# Patient Record
Sex: Male | Born: 1960
Health system: Southern US, Community
[De-identification: ages and names within clinical notes are randomized; demographics above are authoritative.]

## PROBLEM LIST (undated history)

## (undated) ENCOUNTER — Emergency Department (HOSPITAL_COMMUNITY): Admission: EM | Payer: Medicaid Other | Source: Home / Self Care

## (undated) ENCOUNTER — Emergency Department (HOSPITAL_COMMUNITY): Payer: Self-pay | Source: Home / Self Care

## (undated) DIAGNOSIS — I5043 Acute on chronic combined systolic (congestive) and diastolic (congestive) heart failure: Secondary | ICD-10-CM

## (undated) DIAGNOSIS — I428 Other cardiomyopathies: Secondary | ICD-10-CM

## (undated) DIAGNOSIS — G4710 Hypersomnia, unspecified: Secondary | ICD-10-CM

## (undated) DIAGNOSIS — R0600 Dyspnea, unspecified: Secondary | ICD-10-CM

## (undated) DIAGNOSIS — I472 Ventricular tachycardia, unspecified: Secondary | ICD-10-CM

## (undated) DIAGNOSIS — I4891 Unspecified atrial fibrillation: Secondary | ICD-10-CM

## (undated) DIAGNOSIS — I2510 Atherosclerotic heart disease of native coronary artery without angina pectoris: Secondary | ICD-10-CM

## (undated) DIAGNOSIS — Z9989 Dependence on other enabling machines and devices: Secondary | ICD-10-CM

## (undated) DIAGNOSIS — Z95810 Presence of automatic (implantable) cardiac defibrillator: Secondary | ICD-10-CM

## (undated) DIAGNOSIS — J449 Chronic obstructive pulmonary disease, unspecified: Secondary | ICD-10-CM

## (undated) DIAGNOSIS — F17200 Nicotine dependence, unspecified, uncomplicated: Secondary | ICD-10-CM

## (undated) DIAGNOSIS — M1990 Unspecified osteoarthritis, unspecified site: Secondary | ICD-10-CM

## (undated) DIAGNOSIS — F1011 Alcohol abuse, in remission: Secondary | ICD-10-CM

## (undated) DIAGNOSIS — I5022 Chronic systolic (congestive) heart failure: Secondary | ICD-10-CM

## (undated) DIAGNOSIS — G4733 Obstructive sleep apnea (adult) (pediatric): Secondary | ICD-10-CM

## (undated) DIAGNOSIS — I10 Essential (primary) hypertension: Secondary | ICD-10-CM

## (undated) HISTORY — DX: Essential (primary) hypertension: I10

## (undated) HISTORY — DX: Alcohol abuse, in remission: F10.11

## (undated) HISTORY — DX: Nicotine dependence, unspecified, uncomplicated: F17.200

## (undated) HISTORY — DX: Chronic systolic (congestive) heart failure: I50.22

## (undated) HISTORY — DX: Obstructive sleep apnea (adult) (pediatric): G47.33

## (undated) HISTORY — PX: VASECTOMY: SHX75

## (undated) HISTORY — PX: ICD IMPLANT: EP1208

## (undated) HISTORY — DX: Dependence on other enabling machines and devices: Z99.89

## (undated) HISTORY — DX: Ventricular tachycardia: I47.2

## (undated) HISTORY — DX: Ventricular tachycardia, unspecified: I47.20

## (undated) HISTORY — PX: HYDROCELE EXCISION / REPAIR: SUR1145

## (undated) HISTORY — DX: Dyspnea, unspecified: R06.00

## (undated) HISTORY — PX: TUMOR EXCISION: SHX421

## (undated) HISTORY — DX: Morbid (severe) obesity due to excess calories: E66.01

## (undated) HISTORY — DX: Atherosclerotic heart disease of native coronary artery without angina pectoris: I25.10

## (undated) HISTORY — DX: Hypersomnia, unspecified: G47.10

---
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
## 2007-12-10 ENCOUNTER — Ambulatory Visit: Payer: Self-pay | Admitting: Internal Medicine

## 2007-12-10 ENCOUNTER — Inpatient Hospital Stay (HOSPITAL_COMMUNITY): Admission: EM | Admit: 2007-12-10 | Discharge: 2007-12-12 | Payer: Self-pay | Admitting: Emergency Medicine

## 2007-12-11 ENCOUNTER — Encounter (INDEPENDENT_AMBULATORY_CARE_PROVIDER_SITE_OTHER): Payer: Self-pay | Admitting: Internal Medicine

## 2008-01-17 ENCOUNTER — Ambulatory Visit: Payer: Self-pay | Admitting: Cardiology

## 2008-01-17 LAB — CONVERTED CEMR LAB
BUN: 9 mg/dL (ref 6–23)
CO2: 33 meq/L — ABNORMAL HIGH (ref 19–32)
Chloride: 102 meq/L (ref 96–112)
Glucose, Bld: 105 mg/dL — ABNORMAL HIGH (ref 70–99)
Potassium: 4 meq/L (ref 3.5–5.1)
Pro B Natriuretic peptide (BNP): 218 pg/mL — ABNORMAL HIGH (ref 0.0–100.0)

## 2008-02-03 ENCOUNTER — Ambulatory Visit: Payer: Self-pay | Admitting: Cardiology

## 2008-02-24 ENCOUNTER — Ambulatory Visit: Payer: Self-pay | Admitting: Cardiology

## 2008-03-26 ENCOUNTER — Ambulatory Visit: Payer: Self-pay | Admitting: Cardiology

## 2008-04-21 ENCOUNTER — Encounter: Payer: Self-pay | Admitting: Cardiology

## 2008-04-21 ENCOUNTER — Ambulatory Visit: Payer: Self-pay

## 2008-04-21 ENCOUNTER — Ambulatory Visit: Payer: Self-pay | Admitting: Cardiology

## 2008-04-21 LAB — CONVERTED CEMR LAB
BUN: 14 mg/dL (ref 6–23)
Calcium: 9 mg/dL (ref 8.4–10.5)
Digitoxin Lvl: 0.3 ng/mL — ABNORMAL LOW (ref 0.8–2.0)
GFR calc Af Amer: 134 mL/min
GFR calc non Af Amer: 111 mL/min
Potassium: 4 meq/L (ref 3.5–5.1)
Sodium: 140 meq/L (ref 135–145)
TSH: 0.81 microintl units/mL (ref 0.35–5.50)

## 2008-05-11 ENCOUNTER — Ambulatory Visit: Payer: Self-pay | Admitting: Internal Medicine

## 2008-06-04 ENCOUNTER — Ambulatory Visit (HOSPITAL_BASED_OUTPATIENT_CLINIC_OR_DEPARTMENT_OTHER): Admission: RE | Admit: 2008-06-04 | Discharge: 2008-06-04 | Payer: Self-pay | Admitting: Nurse Practitioner

## 2008-06-04 ENCOUNTER — Encounter: Payer: Self-pay | Admitting: Pulmonary Disease

## 2008-06-06 ENCOUNTER — Ambulatory Visit: Payer: Self-pay | Admitting: Internal Medicine

## 2008-06-11 ENCOUNTER — Ambulatory Visit: Payer: Self-pay | Admitting: Cardiology

## 2008-06-19 ENCOUNTER — Ambulatory Visit: Payer: Self-pay | Admitting: Pulmonary Disease

## 2008-06-19 DIAGNOSIS — I10 Essential (primary) hypertension: Secondary | ICD-10-CM | POA: Insufficient documentation

## 2008-06-19 DIAGNOSIS — I5022 Chronic systolic (congestive) heart failure: Secondary | ICD-10-CM | POA: Insufficient documentation

## 2008-06-19 LAB — CONVERTED CEMR LAB
CO2: 30 meq/L (ref 19–32)
Chloride: 101 meq/L (ref 96–112)
GFR calc Af Amer: 134 mL/min
GFR calc non Af Amer: 111 mL/min
Potassium: 4 meq/L (ref 3.5–5.1)

## 2008-06-24 ENCOUNTER — Encounter: Payer: Self-pay | Admitting: Pulmonary Disease

## 2008-06-29 ENCOUNTER — Encounter: Payer: Self-pay | Admitting: Pulmonary Disease

## 2008-07-28 ENCOUNTER — Ambulatory Visit: Payer: Self-pay | Admitting: Internal Medicine

## 2008-09-25 ENCOUNTER — Ambulatory Visit: Payer: Self-pay

## 2008-09-25 ENCOUNTER — Encounter: Payer: Self-pay | Admitting: Internal Medicine

## 2008-09-25 ENCOUNTER — Ambulatory Visit: Payer: Self-pay | Admitting: Cardiology

## 2008-09-25 LAB — CONVERTED CEMR LAB
BUN: 18 mg/dL (ref 6–23)
Calcium: 9.1 mg/dL (ref 8.4–10.5)
Creatinine, Ser: 0.7 mg/dL (ref 0.4–1.5)
GFR calc non Af Amer: 130 mL/min
Glucose, Bld: 115 mg/dL — ABNORMAL HIGH (ref 70–99)
Sodium: 139 meq/L (ref 135–145)

## 2008-09-27 DIAGNOSIS — I2510 Atherosclerotic heart disease of native coronary artery without angina pectoris: Secondary | ICD-10-CM | POA: Insufficient documentation

## 2008-09-27 DIAGNOSIS — E669 Obesity, unspecified: Secondary | ICD-10-CM | POA: Insufficient documentation

## 2008-09-27 DIAGNOSIS — I4729 Other ventricular tachycardia: Secondary | ICD-10-CM | POA: Insufficient documentation

## 2008-09-27 DIAGNOSIS — I472 Ventricular tachycardia: Secondary | ICD-10-CM

## 2009-11-03 ENCOUNTER — Telehealth: Payer: Self-pay | Admitting: Internal Medicine

## 2009-11-12 ENCOUNTER — Emergency Department (HOSPITAL_COMMUNITY): Admission: EM | Admit: 2009-11-12 | Discharge: 2009-11-12 | Payer: Self-pay | Admitting: Emergency Medicine

## 2010-10-11 ENCOUNTER — Telehealth: Payer: Self-pay | Admitting: Internal Medicine

## 2010-10-18 NOTE — Progress Notes (Signed)
Summary: refill-- K+ / lasix / lisinopril   Phone Note Refill Request   Refills Requested: Medication #1:  KLOR-CON M20 20 MEQ CR-TABS 1 once daily   Supply Requested: 3 months  Medication #2:  LISINOPRIL 20 MG TABS 1 tablet two times a day   Supply Requested: 3 months  Medication #3:  LASIX 40 MG TABS 1 once daily   Supply Requested: 3 months [facility] in [place], [place]    Method Requested: Fax to Local Pharmacy Initial call taken by: Migdalia Dk,  November 03, 2009 12:20 PM    Prescriptions: KLOR-CON M20 20 MEQ CR-TABS (POTASSIUM CHLORIDE CRYS CR) 1 once daily  #90 x 1   Entered by:   Hardin Negus, RMA   Authorized by:   Dolores Patty, MD, [facility]   Signed by:   Hardin Negus, RMA on 11/03/2009   Method used:   Electronically to        [facility] (retail)       [address]       [place], [place]  [address]       Ph: [phone]       Fax: [phone]   RxID:   [id number] LASIX 40 MG TABS (FUROSEMIDE) 1 once daily  #90 x 1   Entered by:   Hardin Negus, RMA   Authorized by:   Dolores Patty, MD, [facility]   Signed by:   Hardin Negus, RMA on 11/03/2009   Method used:   Electronically to        [facility] (retail)       [address]       [place], [place]  [address]       Ph: [phone]       Fax: [phone]   RxID:   [id number] LISINOPRIL 20 MG TABS (LISINOPRIL) 1 tablet two times a day  #180 x 1   Entered by:   Hardin Negus, RMA   Authorized by:   Dolores Patty, MD, [facility]   Signed by:   Hardin Negus, RMA on 11/03/2009   Method used:   Electronically to        [facility] (retail)       [address]       [place], [place]  [address]       Ph: [phone]       Fax: [phone]   RxID:   [id number]

## 2010-10-20 NOTE — Progress Notes (Signed)
Summary: refill meds   Phone Note Refill Request Message from:  Patient on October 11, 2010 10:50 AM  Refills Requested: Medication #1:  KLOR-CON M20 20 MEQ CR-TABS 1 once daily  Medication #2:  LASIX 40 MG TABS 1 once daily [facility] on [address].    Method Requested: Fax to Local Pharmacy Initial call taken by: Lorne Skeens,  October 11, 2010 10:52 AM    Prescriptions: KLOR-CON M20 20 MEQ CR-TABS (POTASSIUM CHLORIDE CRYS CR) 1 once daily  #30 x 0   Entered by:   Hardin Negus, RMA   Authorized by:   Dolores Patty, MD, [facility]   Signed by:   Hardin Negus, RMA on 10/11/2010   Method used:   Electronically to        [facility] (retail)       [address]       [place], [place]  [address]       Ph: [phone]       Fax: [phone]   RxID:   [id number] LASIX 40 MG TABS (FUROSEMIDE) 1 once daily  #30 x 0   Entered by:   Hardin Negus, RMA   Authorized by:   Dolores Patty, MD, [facility]   Signed by:   Hardin Negus, RMA on 10/11/2010   Method used:   Electronically to        [facility] (retail)       [address]       [place], [place]  [address]       Ph: [phone]       Fax: [phone]   RxID:   [id number]

## 2010-12-03 ENCOUNTER — Encounter: Payer: Self-pay | Admitting: Cardiology

## 2010-12-07 LAB — URINALYSIS, ROUTINE W REFLEX MICROSCOPIC
Hgb urine dipstick: NEGATIVE
Protein, ur: NEGATIVE mg/dL
Urobilinogen, UA: 0.2 mg/dL (ref 0.0–1.0)
pH: 6 (ref 5.0–8.0)

## 2010-12-07 LAB — CBC
HCT: 43.9 % (ref 39.0–52.0)
MCHC: 34.9 g/dL (ref 30.0–36.0)
MCV: 94.7 fL (ref 78.0–100.0)
Platelets: 199 10*3/uL (ref 150–400)
RBC: 4.63 MIL/uL (ref 4.22–5.81)

## 2010-12-07 LAB — BASIC METABOLIC PANEL
Chloride: 106 mEq/L (ref 96–112)
Creatinine, Ser: 0.8 mg/dL (ref 0.4–1.5)
GFR calc Af Amer: >60 mL/min (ref >60)
Potassium: 4 mEq/L (ref 3.5–5.1)
Sodium: 138 mEq/L (ref 135–145)

## 2010-12-07 LAB — DIFFERENTIAL
Basophils Absolute: 0 10*3/uL (ref 0.0–0.1)
Basophils Relative: 0 % (ref 0–1)
Eosinophils Absolute: 0.1 10*3/uL (ref 0.0–0.7)
Lymphs Abs: 1.3 10*3/uL (ref 0.7–4.0)
Neutrophils Relative %: 67 % (ref 43–77)

## 2010-12-14 ENCOUNTER — Encounter: Payer: Self-pay | Admitting: Cardiology

## 2010-12-14 ENCOUNTER — Ambulatory Visit (INDEPENDENT_AMBULATORY_CARE_PROVIDER_SITE_OTHER): Payer: PRIVATE HEALTH INSURANCE | Admitting: Cardiology

## 2010-12-14 DIAGNOSIS — I2510 Atherosclerotic heart disease of native coronary artery without angina pectoris: Secondary | ICD-10-CM

## 2010-12-14 DIAGNOSIS — F1010 Alcohol abuse, uncomplicated: Secondary | ICD-10-CM | POA: Insufficient documentation

## 2010-12-14 DIAGNOSIS — I428 Other cardiomyopathies: Secondary | ICD-10-CM

## 2010-12-14 DIAGNOSIS — F1011 Alcohol abuse, in remission: Secondary | ICD-10-CM

## 2010-12-14 DIAGNOSIS — I5022 Chronic systolic (congestive) heart failure: Secondary | ICD-10-CM

## 2010-12-14 DIAGNOSIS — F17200 Nicotine dependence, unspecified, uncomplicated: Secondary | ICD-10-CM | POA: Insufficient documentation

## 2010-12-14 LAB — BASIC METABOLIC PANEL
CO2: 29 mEq/L (ref 19–32)
Calcium: 8.9 mg/dL (ref 8.4–10.5)
Chloride: 107 mEq/L (ref 96–112)
GFR: 121.88 mL/min (ref >60.00)
Glucose, Bld: 82 mg/dL (ref 70–99)

## 2010-12-14 MED ORDER — CARVEDILOL 12.5 MG PO TABS: 12.5000 mg | ORAL_TABLET | Freq: Two times a day (BID) | ORAL | Status: DC

## 2010-12-14 NOTE — Progress Notes (Signed)
HPI The patient has been gone from the practice for a while but had previously been managed for nonischemic cardiomyopathy. This was likely related to sleep apnea and alcohol use. Since last being seen he stopped his carvedilol for unclear reasons. I do see that his digoxin was discontinued. He is continued on the medications as listed. He wears CPAP. He drinks only on weekends. He says that compared to his initial presentation he breathes much better. He is not active exercising but he is at work. With this activity he says he works progressively without chest pressure, neck or arm discomfort. He has no significant shortness of breath and denies any PND or orthopnea. He has no palpitations, presyncope or syncope.  No Known Allergies  Current Outpatient Prescriptions on File Prior to Visit  Medication Sig Dispense Refill  . aspirin 81 MG tablet Take 81 mg by mouth daily. occasionally      . furosemide (LASIX) 40 MG tablet Take 40 mg by mouth daily.        Marland Kitchen lisinopril (PRINIVIL,ZESTRIL) 20 MG tablet Take 20 mg by mouth 2 (two) times daily.        . potassium chloride SA (K-DUR,KLOR-CON) 20 MEQ tablet Take 20 mEq by mouth daily.        . carvedilol (COREG) 25 MG tablet Take 25 mg by mouth daily.        Marland Kitchen DISCONTD: digoxin (LANOXIN) 0.25 MG tablet Take 250 mcg by mouth daily.          Past Medical History  Diagnosis Date  . HTN (hypertension)   . OSA on CPAP   . CAD (coronary artery disease)   . Ventricular tachycardia   . Dyspnea   . Obesity, morbid   . CHF (congestive heart failure)   . Hypersomnia    ROS As stated in the HPI and negative for all other systems.  PHYSICAL EXAM BP 142/92  Pulse 77  Ht 6\' 4"  (1.93 m)  Wt 324 lb (146.965 kg)  BMI 39.44 kg/m2 GENERAL:  Well appearing HEENT:  Pupils equal round and reactive, fundi not visualized, oral mucosa unremarkable NECK:  No jugular venous distention, waveform within normal limits, carotid upstroke brisk and symmetric, no bruits,  no thyromegaly LYMPHATICS:  No cervical, inguinal adenopathy LUNGS:  Clear to auscultation bilaterally BACK:  No CVA tenderness CHEST:  Unremarkable HEART:  PMI not displaced or sustained,S1 and S2 within normal limits, no S3, no S4, no clicks, no rubs, no murmurs ABD:  Flat, positive bowel sounds normal in frequency in pitch, no bruits, no rebound, no guarding, no midline pulsatile mass, no hepatomegaly, no splenomegaly. obese EXT:  2 plus pulses throughout, no edema, no cyanosis no clubbing SKIN:  No rashes no nodules NEURO:  Cranial nerves II through XII grossly intact, motor grossly intact throughout PSYCH:  Cognitively intact, oriented to person place and time  EKG:  Sinus rhythm, rate 77, left axis deviation, left anterior fascicular block, anterolateral T wave inversions.  Unchanged  ASSESSMENT AND PLAN

## 2010-12-14 NOTE — Assessment & Plan Note (Signed)
We have discussed the need for complete abstinence from alcohol.

## 2010-12-14 NOTE — Assessment & Plan Note (Signed)
We have discussed the need for complete abstinence from tobacco. He says he only smokes a few cigarettes daily.

## 2010-12-14 NOTE — Patient Instructions (Signed)
Have a BMP today Have A 2 D Echo for Cardiomyopathy Start Carvedilol 12.5 mg one twice a day See Dr Antoine Poche back in 12 months

## 2010-12-14 NOTE — Assessment & Plan Note (Signed)
He feels much better on CPAP. He will continue with this.

## 2010-12-14 NOTE — Assessment & Plan Note (Signed)
The patient understands the need to lose weight with diet and exercise. We have discussed specific strategies for this.  

## 2010-12-14 NOTE — Assessment & Plan Note (Signed)
His blood pressure is slightly elevated but he is not using his carvedilol which will be restarted as stated above.

## 2010-12-14 NOTE — Assessment & Plan Note (Signed)
I will restart carvedilol at 12.5 mg b.i.d. With a goal actually a 50 b.i.d. I will repeat an echocardiogram.

## 2010-12-21 ENCOUNTER — Other Ambulatory Visit (HOSPITAL_COMMUNITY): Payer: PRIVATE HEALTH INSURANCE | Admitting: Radiology

## 2010-12-25 ENCOUNTER — Other Ambulatory Visit: Payer: Self-pay | Admitting: Internal Medicine

## 2010-12-29 ENCOUNTER — Ambulatory Visit (HOSPITAL_COMMUNITY): Payer: PRIVATE HEALTH INSURANCE | Attending: Cardiology | Admitting: Radiology

## 2010-12-29 DIAGNOSIS — I2510 Atherosclerotic heart disease of native coronary artery without angina pectoris: Secondary | ICD-10-CM

## 2010-12-29 DIAGNOSIS — I059 Rheumatic mitral valve disease, unspecified: Secondary | ICD-10-CM | POA: Insufficient documentation

## 2011-01-10 ENCOUNTER — Telehealth: Payer: Self-pay | Admitting: Cardiology

## 2011-01-11 ENCOUNTER — Telehealth: Payer: Self-pay | Admitting: Cardiology

## 2011-01-11 NOTE — Telephone Encounter (Signed)
Pt calling re test results °

## 2011-01-11 NOTE — Progress Notes (Signed)
Pt is aware of echo results and was scheduled for 02/17/11 in follow up

## 2011-01-11 NOTE — Telephone Encounter (Signed)
Left message yesterday and today for pt to call back with echo results.  There is a new phone mesage from today please review it

## 2011-01-11 NOTE — Telephone Encounter (Signed)
Pt aware of results and was scheduled for follow up appt 02/17/2011 at 11:15 am

## 2011-01-11 NOTE — Telephone Encounter (Signed)
Left message for pt to call back for echo results.  

## 2011-01-26 ENCOUNTER — Telehealth: Payer: Self-pay | Admitting: Internal Medicine

## 2011-01-26 NOTE — Telephone Encounter (Signed)
Pt signed ROI,Mailed Appt Listing to pt 01/26/11/km

## 2011-01-31 NOTE — Assessment & Plan Note (Signed)
[facility]                          CHRONIC HEART FAILURE NOTE   NAME:[name] [name], [name] [name]                          MRN:          [id number]  DATE:02/03/2008                            DOB:          1960/12/01    PRIMARY CARDIOLOGIST:  [name]. [name], MD   PRIMARY CARE PHYSICIAN:  Windle Guard, M.D.   Delon returns today for further followup of his congestive heart  failure, which is secondary to nonischemic cardiomyopathy with history  of polysubstance abuse.  When I last saw [name] [name] earlier this month, I  had planned on increasing his carvedilol, but at that it turned out he  was only taking his dose once a day, so I had him increase it to b.i.d.  and return today for followup.  He states compliance with the medicines.  No problems with lightheadedness, dizziness, presyncope, syncopal  episodes, palpitations, orthopnea or PND, or peripheral edema.  Unfortunately, he continues to consume ETOH.  He states he drinks one to  two 6-packs over the weekend, and smokes 1-3 cigarettes a day.  He  denies any crack cocaine use.  He has continued to do some light  physical labor with his family.  States he will work for a couple of  hours and then take a break.  He can do this three or four days a week  without too much fatigue.  Otherwise, states he has been feeling much  better.   PAST MEDICAL HISTORY:  1. Congestive heart failure, relatively new in nature, nonischemic      with an EF of 20% status post cardiac catheterization and 2D      echocardiogram.  Patient with minimal coronary plaque.  2. Polysubstance abuse including alcohol, marijuana, cocaine and      tobacco on weekends.  3. Morbid obesity.  Obstructive sleep apnea untreated at this time.  4. Hypertension with poor management.  5. History of nonsustained V-tach during recent hospitalization.   REVIEW OF SYSTEMS:  As stated above.  Otherwise negative.   CURRENT MEDICATIONS:  1. Carvedilol  3.125 mg b.i.d.  2. Digoxin 0.25 daily.  3. Lisinopril 20 daily.  4. Furosemide 40 daily.  5. Potassium 20 mEq daily.   PHYSICAL EXAMINATION:  Weight 349 pounds.  Weight is up 5 pounds.  Blood  pressure 155/93.  Heart rate 70.  Mr. Ciotti is in no acute distress.  No  signs of jugular vein distension at 45 degree angle.  LUNGS:  He has fine, scattered rhonchi throughout.  Otherwise clear to  auscultation.  CARDIOVASCULAR:  Exam reveals an S1 and S2.  Regular rate and rhythm.  SKIN:  Warm and dry.  ABDOMEN:  Obese, soft, nontender.  LOWER EXTREMITIES:  Without clubbing, cyanosis or edema.  NEUROLOGICALLY: Alert and oriented x3.   IMPRESSION:  Congestive heart failure secondary to nonischemic  cardiomyopathy in the setting of polysubstance abuse.  We will increase  carvedilol to 6.25 mg b.i.d. and once again, have reinforced complete  abstinence from polysubstance use.  We will continue to titrate  medication as tolerated.  Plan on seeing  [name] [name] back in three weeks for  further adjustment in his carvedilol.  We will also talk with him about  his sleep apnea in followup.      Dorian Pod, ACNP  Electronically Signed      [name], MD, [facility]  Electronically Signed   MB/MedQ  DD: 02/03/2008  DT: 02/03/2008  Job #: [id number]   cc:   Windle Guard, M.D.

## 2011-01-31 NOTE — Assessment & Plan Note (Signed)
[facility]                          CHRONIC HEART FAILURE NOTE   NAME:[name], [name]                          MRN:          [id number]  DATE:02/24/2008                            DOB:          Oct 18, 1960    PRIMARY CARDIOLOGIST:  [name] [name], M.D.   PRIMARY CARE PHYSICIAN:  [name] [name], M.D.   [name] [name] returns today for follow-up of his congestive heart failure  which is secondary to nonischemic cardiomyopathy.  In the setting of  polysubstance abuse.  [name] [name] returns today for further titration of  this medication.  However, in talking with him he once again has only  been taking his carvedilol once a day even though I have written out  instructions for him both of the last office visits.  He states he has  only been taking the carvedilol once a day.  Each time I have titrated  the dose and he has gone back to once a day.  He continues to drink.  He  states he is now down to two beers on Saturday and Sunday.  Continues to  use marijuana on weekends.  Has an occasional cigarette.  He has been  doing some physical labor helping his father.  He states he planted a  dogwood tree this morning.  The hole had already been dug.  He just put  the tree in and filled in the dirt.  He states he has been doing some  walking.  Further clarification he is walking one mile about once a week  with his sister.  He denies any lightheadedness or dizziness, any fluid  volume overload.  Denies any episodes of chest discomfort.   PAST MEDICAL HISTORY:  1. Congestive heart failure, nonischemic with EF 20%, status post      cardiac catheterization and 2-D echocardiogram.  The patient with      minimal coronary plaque.  2. Ongoing polysubstance abuse including alcohol, marijuana and      tobacco with a history of cocaine use.  3. Morbid obesity.  4. Questionable obstructive sleep apnea.  5. Hypertension.  6. History of nonsustained V-tach during recent  hospitalization.   REVIEW OF SYSTEMS:  As stated above, otherwise negative.   CURRENT MEDICATIONS:  1. The patient is taking carvedilol 6.25 mg once a day only      (prescribed b.i.d.)  2. Aspirin 81 mg.  3. Digoxin 0.25 mg.  4. Lisinopril 20 mg.  5. Lasix 40 mg.  6. KCl 20 mEq.  7. Folate 1 mg daily.  8. Multivitamin.   PHYSICAL EXAMINATION:  VITAL SIGNS:  Weight 344 pounds.  The patient's  weight is down 5 pounds.  Blood pressure 142/81 with a heart rate of 70.  GENERAL:  He is in no acute distress.  NECK:  No signs of jugular vein distention at 45-degree angle.  LUNGS:  Clear to auscultation with distant breath sounds at bilateral  bases.  CARDIOVASCULAR:  S1 and S2, regular rate and rhythm.  Do not hear an S3.  ABDOMEN:  Obese, soft, nontender.  LOWER EXTREMITIES:  Without clubbing, cyanosis  or edema.  NEUROLOGIC:  Neurologically alert and oriented x3.   CLINICAL DATA:  The patient ambulated greater than 300 feet.  Pulse ox  maintained between 93% and 96%.  The patient with dyspnea on exertion  with current class II symptoms.  Once again I have written out for him  to take his carvedilol as follows, 6.25 mg twice a day for 1 week and  then 6.25 in the morning and 12.5 cm in the evening for 1 week and then  12.5 mg b.i.d. until he sees me back in 4 weeks.  Also going to go ahead  and have home sleep study done will treat as depending on results.  After next visit will have the patient proceed with CPX test.      Dorian Pod, ACNP  Electronically Signed      [name]. [name], MD  Electronically Signed   MB/MedQ  DD: 02/24/2008  DT: 02/24/2008  Job #: [id number]   cc:   [name] [name], M.D.

## 2011-01-31 NOTE — Assessment & Plan Note (Signed)
[facility]                          CHRONIC HEART FAILURE NOTE   NAME:[name] [name], [name] [name]                          MRN:          [id number]  DATE:09/25/2008                            DOB:          1961/02/08    PRIMARY CARDIOLOGIST:  [name]. [name], MD   PRIMARY CARE:  Windle Guard, MD   [name] returns today for further followup of his congestive heart  failure, which is secondary to nonischemic cardiomyopathy in the setting  of previous polysubstance abuse, obesity, and hypertension.  Unfortunately, [name] continues to consume some EtOH.  He states he is  down to a 6-pack a week.  He denies any recent crack cocaine use.  He  has continued to do quite well over the last few months.  He has been  diligent about trying to make lifestyle modifications.  He has been  increasing his walking and making changes in his diet.  His weight is  down almost 10 pounds today from his appointment back in the fall.  Very  compliant with his CPAP and compliant with medications.  He saw Dr.  [name] back in November and adjusted some of his medications.  He is  tolerating Coreg and lisinopril at current doses without problems.  Denies any episodes of palpitations, lightheadedness, dizziness,  presyncope, or syncopal episodes.  Denies any symptoms suggestive of  volume overload.   PAST MEDICAL HISTORY:  Congestive heart failure, secondary to  nonischemic cardiomyopathy in the setting of polysubstance abuse  including alcohol and crack cocaine, obesity, hypertension, and severe  sleep apnea.   CURRENT MEDICATIONS:  1. Aspirin 81.  2. Lasix 40.  3. KCl 20.  4. Folate 1 mg daily.  5. Multivitamin daily.  6. Coreg 25 b.i.d.  7. Lisinopril 40 daily.   REVIEW OF SYSTEMS:  As stated above.   PHYSICAL EXAMINATION:  VITAL SIGNS:  Weight 331, blood pressure 115/63,  and heart rate 68.  GENERAL:  Nachman is in no acute distress.  NECK:  No signs of jugular vein  distention at 45-degree angle, but quite  hard to tell with his neck size.  CARDIOVASCULAR:  S1 and S2.  Frequent early beats.  LUNGS:  Clear to auscultation.  ABDOMEN:  Obese, soft, and nontender.  Positive bowel sounds.  LOWER EXTREMITIES:  Without clubbing, cyanosis, or edema.  NEUROLOGICAL:  Alert and oriented x3.   IMPRESSION:  Congestive heart failure, secondary to nonischemic  cardiomyopathy.  Blood pressure is well controlled at this time.  Good  heart rate.  Also, have reinforced education on complete abstinence of  EtOH use.  Reinforced importance of continuous positive airway pressure.  Owen underwent a 2-D echocardiogram today.  I do not have the results  yet.  I suspect his ejection fraction is grossly normal.  We will  continue current medications including Coreg 25 b.i.d. and lisinopril  40.  I have him follow up in a couple of months for review of his 2-D  echocardiogram.  He is also due for lab work.      Dorian Pod, ACNP  Electronically Signed  [name], MD, [facility]  Electronically Signed   MB/MedQ  DD: 09/25/2008  DT: 09/26/2008  Job #: [id number]   cc:   Windle Guard, M.D.

## 2011-01-31 NOTE — Assessment & Plan Note (Signed)
[facility]                            CARDIOLOGY OFFICE NOTE   NAME:Freeze, [name] [name]                          MRN:          [id number]  DATE:05/11/2008                            DOB:          11-22-1960    PRIMARY CARE PHYSICIAN:  Windle Guard, M.D.   INTERVAL HISTORY:  [name] [name] is a very pleasant 50 year old male with a  history of hypertension, morbid obesity, and polysubstance abuse.  He  was diagnosed earlier this year with nonischemic cardiomyopathy with  severe LV dysfunction with an EF of 10-15%.  Cardiac catheterization  showed minimal nonobstructive coronary artery disease.   He has been following with Dorian Pod in the [facility] Failure Clinic  and has been doing quite well.  She last saw him in July and increased  his Coreg to 25 b.i.d.  He has tolerated this without a problem.  He  says he is able to do just about anything he wants to do.  He is walking  half a mile at a time without any dyspnea.  No chest pain.  He has  occasional mild lower extremity edema.  No orthopnea, no PND.  He feels  his breathing is the best it has been in a long time.  He smokes an  occasional cigarette but no further substance abuse.  He has not heard  yet back from [facility] his [facility]AP equipment.   CURRENT MEDICATIONS:  1. Aspirin 81.  2. Digoxin 0.25 a day.  3. Lisinopril 20 a day.  4. Lasix 40 a day.  5. Potassium 20 a day.  6. Folate 1 mg a day.  7. Coreg 25 b.i.d.   PHYSICAL EXAMINATION:  GENERAL:  He is in no acute distress, ambulatory  in the clinic without any respiratory difficulty.  VITAL SIGNS:  Blood pressure is 136/78; heart rate 69; weight is 340,  which is down 9 pounds.  HEENT:  Normal.  NECK:  Supple.  Unable to assess JVD due to the size of his neck.  Carotids are 2+ bilaterally without any bruits.  There is no  lymphadenopathy, no thyromegaly.  CARDIAC:  PMI is nondisplaced.  Regular rate and rhythm.  No S3, no  murmur.  LUNGS:  Clear.  ABDOMEN:  Obese, nontender, nondistended.  No obvious  hepatosplenomegaly, no bruits, no mass.  EXTREMITIES:  Warm with no cyanosis, clubbing, or edema.  No rash.  NEURO:  Alert and oriented x3.  Cranial nerves II through XII are  intact.  Moves all 4 extremities without difficulty.  Affect is  pleasant.   Followup echocardiogram done about 2 weeks ago showed an EF of 30-40%.  This was considerably up from 10-15%.  Ventricle is still mildly dilated  at 57 cm.  There is no mitral regurgitation, RV looks fine.   ASSESSMENT AND PLAN:  1. Nonischemic cardiomyopathy, likely due to polysubstance abuse.      This is now markedly improved.  His [employer] [place] Heart Association      class I to II volume status looks good.  I suspect his ejection  fraction will continue to improve with medical therapy and at this      point, I do not think he will need a defibrillator.  We will      recheck his EF in 3 months.  I will go ahead and increase his      lisinopril to 20 b.i.d. and check a BMET later this week.  We may      need to consider spironolactone as well.  2. Hypertension, improved control but still elevated.  We are      increasing lisinopril.  3. Sleep apnea.  I reminded him of the need to make sure he gets his      equipment in line.  4. Tobacco use.  He has a smoking history of a few cigarettes here and      there.  I reminded him of the need for complete cessation.   DISPOSITION:  He will follow up in the [facility] Failure Clinic in 1 month  and see me in 3 months.     [name]. [name], MD  Electronically Signed    DRB/MedQ  DD: 05/11/2008  DT: 05/11/2008  Job #: [id number]

## 2011-01-31 NOTE — Assessment & Plan Note (Signed)
[facility]                          CHRONIC HEART FAILURE NOTE   NAME:[name], [name]                          MRN:          [id number]  DATE:06/11/2008                            DOB:          1961-05-14    PRIMARY CARDIOLOGIST:  [name]. [name], MD   PRIMARY CARE PHYSICIAN:  Windle Guard, MD   Eilan returns today for further followup of his congestive heart  failure with secondary to nonischemic cardiomyopathy in the setting of  previous polysubstance abuse, obesity, and hypertension.  When I last  saw [name] [name] in July, he was doing quite well.  I increased his  carvedilol to 25 mg b.i.d.  I scheduled him for repeat BMET and echo.  His previous EF had been 15-20%.  Echocardiogram done on April 21, 2008,  showed an improvement to 30-40%.  The patient also followed up with Dr.  [name] since that time.  He states he increased his lisinopril to 20  mg b.i.d., but [name] [name] has not been taking it twice a day.  Also based  on the patient's weight, I planned on increasing his carvedilol to 50 mg  b.i.d., but [name] [name] has already done that.  He states he has been taking  50 twice a day for at least a month.  He continues to try and remain  active.  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  Tries to walk  1 or 2 miles 3 times a week, although he has not been successful in  losing any weight.  He denies any orthopnea or PND.  I sent him for a  sleep study which was done on June 04, 2008.  The patient found to  have severe obstructive sleep apnea with significant oxygen  desaturation.  His mean oxygen saturation before CPAP was about 83%.  He  was initiated on BiPAP, improved his oxygen saturation to 92% on room  air.  The patient is pending a followup pulmonary for evaluation.   PAST MEDICAL HISTORY:  1. Congestive heart failure secondary to nonischemic cardiomyopathy      with a history of polysubstance abuse, marked obesity, and  hypertension.  2. Severe sleep apnea, pending CPAP initiation.  3. Hypertension.  4. Tobacco use.  5. EtOH use.  6. History of nonsustained V-tach during recent hospitalization.  7. Status post cardiac catheterization showing a minimal coronary      plaque.   CURRENT MEDICATIONS:  1. Aspirin 81.  2. Digoxin 0.25.  3. Lisinopril 20.  The patient should be taking it b.i.d. per Dr.      Prescott Gum note, but the patient states he is only taking it once      a day.  4.  Lasix 40 mg daily.  4. KCl 20 mEq daily.  5. Folate 1 mg daily.  6. Multivitamin daily.  7. Coreg, the patient should be taking it 25 mg b.i.d.  He is taking      50 mg b.i.d.   REVIEW OF SYSTEMS:  As stated above, otherwise negative.   PHYSICAL EXAMINATION:  VITAL SIGNS:  Weight  340 pounds and blood  pressure 140/74 with a heart rate of 84.  GENERAL:  [name] [name] was in no acute distress.  NECK:  No obvious JVD at 45-degree angle, but difficult to assess  secondary to size of his neck.  CARDIOVASCULAR:  S1 and S2.  Regular rate and rhythm.  LUNGS:  Clear to auscultation.  ABDOMEN:  Soft, nontender, and positive bowel sounds.  EXTREMITIES:  Lower extremity is without clubbing, cyanosis, or edema.  NEUROLOGIC:  Alert and oriented x3.   IMPRESSION:  Congestive heart failure secondary to nonischemic  cardiomyopathy with much improvement in his ejection fraction from 15-  20% to 40%.  We will continue carvedilol 50 mg b.i.d. as this was goal  for patient anyway and he is tolerating the dose.  We will increase his  lisinopril to 20 mg b.i.d.  I have encouraged him to continue trying to  exercise and lose weight and abstain from polysubstance use.  Also, we  will have the patient return in 1 week for repeat BMET with increase in  his lisinopril.  I will see him back in 3 months for further evaluation.      Dorian Pod, ACNP  Electronically Signed      [name], MD, [facility]  Electronically Signed    MB/MedQ  DD: 06/11/2008  DT: 06/11/2008  Job #: [id number]   cc:   Windle Guard, M.D.

## 2011-01-31 NOTE — Discharge Summary (Signed)
NAME:  [name] [name], [name] [name]                 ACCOUNT NO.:  [id number]   MEDICAL RECORD NO.:  [id number]          PATIENT TYPE:  INP   LOCATION:  4729                         FACILITY:  [facility]   PHYSICIAN:  Marcellus Scott, MD     DATE OF BIRTH:  [birth year]   DATE OF ADMISSION:  12/10/2007  DATE OF DISCHARGE:  12/12/2007                               DISCHARGE SUMMARY   PRIMARY CARE PHYSICIAN:  Dr. Windle Guard.   DISCHARGE DIAGNOSES:  1. Acute systolic congestive heart failure secondary to nonischemic      cardiomyopathy.  2. Hypertension.  3. Obesity.  4. Rule out obstructive sleep apnea syndrome.  5. Polysubstance abuse - alcohol, marijuana, cocaine.  6. Metabolic syndrome.  7. Hypokalemia - repleted.  8. An episode of 7 beat nonsustained ventricular tachycardia.   DISCHARGE MEDICATIONS:  1. Enteric coated aspirin 81 mg p.o. daily.  2. Coreg 3.125 mg p.o. twice daily.  3. Digoxin 0.25 mg p.o. daily.  4. Lisinopril 20 mg p.o. daily.  5. Lasix 40 mg p.o. daily.  6. Potassium chloride 20 mEq p.o. daily.  7. Folate 1 mg p.o. daily.  8. Multivitamin 1 p.o. daily.   PROCEDURE:  1. Cardiac catheterization by Dr. [name] on March 25.      Conclusion:  Minimal coronary plaque. Severe left ventricular      dysfunction. Elevated end-diastolic pressure. Mildly elevated right-      sided pressures. Recommendations:  Medical management for      nonischemic cardiomyopathy. This should include treatment of      hypertension, sleep apnea and avoidance of alcohol among other      things.  2. Chest x-ray on March 24. Impression:  Findings compatible with CHF      with minimal interstitial edema. Chronic bronchitic changes.   PERTINENT LABS:  Basic metabolic panel unremarkable with BUN 12,  creatinine of 1.02. CBCs with hemoglobin 15.8, hematocrit 45, white  blood cells 6.5, platelets 216. Magnesium 2.2. Cardiac panels cycled and  negative, lipid panel with cholesterol 136, triglycerides  115, HDL 23,  LDL 90, VLDL 23. BNP of 459. Hemoglobin A1c of 5.6. TSH of 1.368. Urine  drug screen positive for opiates and tetrahydrocannabinol. Hepatic panel  unremarkable except bilirubin of 1.5. D-dimer 0.59.   CONSULTATIONS:  Cardiology, Dr. [name].   HOSPITAL COURSE AND PATIENT DISPOSITION:  Please refer to the history  and physical note for initial admission details. In summary, [name] [name] is  a 50 year old male patient with a history of polysubstance abuse,  probable hypertension who presented with paroxysmal nocturnal dyspnea,  orthopnea, dyspnea on exertion which was ongoing for 2 months. He denied  any frank chest pain. There was some history of obstructive sleep apnea  but the patient never completed a sleep study as an outpatient. Further  evaluation in the emergency room suggested new onset of congestive heart  failure. The patient was thereby admitted for further evaluation and  management.   1. Acute systolic congestive heart failure secondary to nonischemic      cardiomyopathy. The patient was admitted to the telemetry  bed. His      cardiac enzymes were cycled and were not in keeping with acute      coronary syndrome. He was started on IV Lasix, ACE inhibitors,      aspirin. Echocardiogram which showed left ventricular moderately      dilated with left ventricular systolic function severely reduced      with an ejection fraction between 10 and 15%. There was diffuse      left ventricular hypokinesis. Left ventricular wall thickness was      moderately increased. Cardiology was consulted. They proceeded to      do cardiac right and left catheterization. Please refer to their      notes for further details. The ejection fraction by the cath was      20% with global hypokinesis and a very large left ventricle. They      have indicated medical management. The patient's dyspnea is much      better now. He is stable for discharge to be followed up with the      heart  failure clinic.  2. Hypertension which needs tighter control as an outpatient.  3. Obesity. The patient has been counseled regarding dieting and      weight loss.  4. Rule out obstructive sleep apnea syndrome. The patient is so have      an outpatient sleep study done through his primary medical doctor      and may need CPAP nightly.  5. Polysubstance abuse: He was briefly placed on alcohol withdrawal      protocol however, he has never demonstrated any frank withdrawal.      Counceled.  6. Metabolic syndrome.  7. Hypokalemia which was repleted.  8. Episode of nonsustained ventricular tachycardia. The patient's      lytes were repleted. His beta blockers should help. He needs to      followup with cardiology. He may require repeat echocardiogram once      as he has been off polysubstance abuse and his medications to see      is his ejection fraction improves.      Marcellus Scott, MD  Electronically Signed     AH/MEDQ  D:  12/12/2007  T:  12/12/2007  Job:  [id number]   cc:   Windle Guard, M.D.  [name]. [name], MD

## 2011-01-31 NOTE — Cardiovascular Report (Signed)
NAME:  [name] [name], [name] [name]                 ACCOUNT NO.:  [id number]   MEDICAL RECORD NO.:  [id number]          PATIENT TYPE:  INP   LOCATION:  4729                         FACILITY:  [facility]   PHYSICIAN:  [name], MD, FACCDATE OF BIRTH:  01-14-1961   DATE OF PROCEDURE:  12/11/2007  DATE OF DISCHARGE:                            CARDIAC CATHETERIZATION   PRIMARY:  Dr. Windle Guard.   PROCEDURE:  Right and left heart catheterization/coronary arteriography.   INDICATION:  Evaluate patient with cardiomyopathy and heart failure.   PROCEDURE NOTE:  The right and left heart catheterization was performed  via the right femoral artery.  Both vessels were cannulated using  anterior wall puncture.  A #6 French arterial sheath and a #7-French  venous sheath were inserted via the modified Seldinger technique.  Preformed Judkins, pigtail and Swan-Ganz catheter were utilized.  The  patient tolerated the procedure well and left the lab in stable  condition.   RESULTS:  Hemodynamics:  RA mean 12, RV 38/12, PA 36/16 with a mean of  27, pulmonary capillary wedge pressure mean 17, LV 118/21, AO 120/89.   Coronaries:  The left main was normal.  The LAD had luminal  irregularities.  There was proximal 25% stenosis.  First and second  diagonal were small and normal.  Third diagonal was moderate-sized  and  normal.  The circumflex in the AV groove had luminal irregularities.  There was a ramus intermedius which was small and  normal.  There was a  mid obtuse marginal which was large and normal.  The right coronary  artery was dominant.  There were diffuse luminal irregularities.  The  PDA was moderate-sized and normal.  Posterolateral was moderate-sized  and normal.   LEFT VENTRICULOGRAM:  A left ventriculogram was obtained in the RAO  projection.  The EF was about 20% with global hypokinesis.  It was a  very large ventricle.   CONCLUSION:  Minimal coronary plaque.  Severe left ventricular  dysfunction.  Elevated EDP.  Mildly elevated right-sided pressures.   PLAN:  The patient will have medical management for his nonischemic  cardiomyopathy.  This should include treatment of his hypertension,  sleep apnea and avoidance of alcohol among other things.      [name], MD, [facility]  Electronically Signed    JH/MEDQ  D:  12/11/2007  T:  12/11/2007  Job:  [id number]   cc:   Windle Guard, M.D.

## 2011-01-31 NOTE — Procedures (Signed)
NAME:  [name] [name], [name] [name]                 ACCOUNT NO.:  [id number]   MEDICAL RECORD NO.:  [id number]          PATIENT TYPE:  OUT   LOCATION:  SLEEP CENTER                 FACILITY:  [facility]   PHYSICIAN:  Clinton D. Maple Hudson, MD, FCCP, FACPDATE OF BIRTH:  [birth year]   DATE OF STUDY:  06/04/2008                            NOCTURNAL POLYSOMNOGRAM   REFERRING PHYSICIAN:   REFERRING PHYSICIAN:  Dorian Pod, ACNP.   INDICATION FOR STUDY:  Hypersomnia with sleep apnea, snoring, and  daytime fatigue.   EPWORTH SLEEPINESS SCORE:  19/24.   BMI:  41.4.   WEIGHT:  340 pounds.   HEIGHT:  76 inches.   NECK:  21.5 inches.   HOME MEDICATIONS:  Charted and reviewed.   SLEEP ARCHITECTURE:  Split-study protocol.  During the diagnostic phase,  total sleep time was 124 minutes with sleep efficiency 69.7%.  Stage I  was 5.6%.  Stage II 91.1%.  Stage III absent.  REM 3.2% of total sleep  time.  Sleep latency 16.5 minutes.  REM latency 155.5 minutes.  Awake  after sleep onset 36 minutes.  Arousal index 97.3, indicating increased  EEG arousal.   RESPIRATORY DATA:  Split-study protocol.  Apnea-hypopnea index (AHI)  109.4 per hour.  There were 226 events scored, including 224 obstructive  apneas and 2 hypopneas before CPAP.  Events were not positional.  REM  AHI 75.  CPAP was titrated to 18 CWP with incomplete control of events.  The technician then changed to bilevel pressure:  Inspiratory 21,  expiratory 18 with an AHI of 3.5 per hour.  Patient chose a large Mirage  Quattro full face mask with heated humidifier.   OXYGEN DATA:  Loud snoring with oxygen desaturation to a nadir of 33%  and mean saturation of 83.7% before CPAP.  After CPAP control, mean  oxygen saturation held 92.6% on room air.   CARDIAC DATA:  Multifocal PVCs, bigeminy, and PACs.  The technician  reported heart rate into the 30s, but this appeared to reflect  ventricular bigeminy.   MOVEMENT-PARASOMNIA:  Occasional limb jerk,  insignificant.  Bathroom x1.   IMPRESSIONS-RECOMMENDATIONS:  1. Severe obstructive sleep apnea/hypopnea syndrome (apnea-hypopnea      index 109.4 per hour with nonpositional events, loud snoring, and      significant oxygen desaturation into the 33% range transiently).      Mean oxygen saturation before CPAP was only 83.7%.  2. Successful positive airway pressure titration required BiPAP,      inspiratory 21/expiratory 18 for an apnea-hypopnea index of 3.5 per      hour.  CPAP at 18 CWP had provided incomplete control.  He chose a      large Mirage Quattro full face mask with heated humidifier.  Notes      that oxygen saturation was improved by positive airway pressure to      92.6% on room air.      Clinton D. Maple Hudson, MD, [facility], FACP  Diplomate, Biomedical engineer of Sleep Medicine  Electronically Signed     CDY/MEDQ  D:  06/06/2008 08:37:45  T:  06/06/2008 13:19:32  Job:  [id number]

## 2011-01-31 NOTE — H&P (Signed)
NAME:  [name] [name], [name] [name]                 ACCOUNT NO.:  [id number]   MEDICAL RECORD NO.:  [id number]          PATIENT TYPE:  EMS   LOCATION:  MAJO                         FACILITY:  [facility]   PHYSICIAN:  [name] [name], M.D.       DATE OF BIRTH:  03/18/1961   DATE OF ADMISSION:  12/10/2007  DATE OF DISCHARGE:                              HISTORY & PHYSICAL   PRIMARY CARE PHYSICIAN:  [name] [name], M.D.   CHIEF COMPLAINT:  Shortness of breath.   HISTORY OF PRESENT ILLNESS:  [name] [name] is a 51 year old gentleman with  past medical history of tobacco abuse, obesity, and probable  hypertension, who has been having worsening episodes of paroxysmal  nocturnal dyspnea, orthopnea, and dyspnea on exertion for the past 2  months.  Today, he had another episode of shortness of breath in the  morning, and his mother forced him to come to the emergency room.  The  patient denies any frank chest pain, but he says that breathing is quite  difficult.   PAST MEDICAL HISTORY:  1. Obstructive sleep apnea, for which the patient was scheduled to      undergo a sleep study, but he never showed up.  2. Varicocele surgery.   HOME MEDICATIONS:  Ibuprofen.   SOCIAL HISTORY:  The patient is unemployed.  He used to smoke a pack of  cigarettes a day until 3 years ago, which puts him up to a 20 pack-year  history.  He drinks heavy beer every other day, up to 6-packs, even more  on the weekends.  He uses cocaine occasionally, last time about a week  ago.   FAMILY HISTORY:  The patient's mother is alive.  Had CABG at age 51.  The patient's grandfather had CABG.  Father is alive and healthy.  Two  sisters and one brother are alive and healthy.   REVIEW OF SYSTEMS:  As per HPI.  Also positive for snoring.  Positive  for nocturia.  Positive for daytime sleepiness.  Positive for left knee  pain.  Positive for an abdominal rectus muscle diastasis.  All other  systems as per HPI.  Other systems negative.   PHYSICAL EXAM UPON  ADMISSION:  VITAL SIGNS:  Temperature 96.9, [name]  pressure 130/84, pulse 91, respirations 30, saturations 94% on room air.  GENERAL APPEARANCE:  A morbidly obese gentleman, sitting on the  stretcher, in minimal distress.  He is alert, oriented, to place,  person, and time.  HEENT:  His head indicates clear rhinophyma.  Eyes:  Pupils equal and  round, reactive to light and accommodation.  Extraocular movements  intact.  Conjunctiva pink.  Sclerae anicteric.  Throat clear.  NECK:  Supple.  it is very thick.  Impossible to appreciate JVD.  CHEST:  Barrel shaped.  He does have good excursions.  There are no  significant crackles or rhonchi.  HEART:  Regular, distant, without any significant murmurs, rubs, or  gallops.  ABDOMEN:  Obese, distended, nontender.  There is diastasis rectus  appreciable when the patient contracts his abdominal muscles.  LOWER EXTREMITIES:  +3  bilateral edema.  SKIN:  Warm, dry, without any suspicious rashes.  NEUROLOGIC:  Cranial nerves II-XII intact.  Strength 5/5 in all 4  extremity.  Sensation intact in all four extremities.   LABORATORY VALUES AT THE TIME OF ADMISSION:  White [name] cell count is  pending.  Sodium is 141, potassium 3.7, chloride 104, BUN 10, creatinine  1.1.  Hemoglobin 14.3.  Chest x-ray shows congestive heart failure,  minimal interstitial edema.  D-dimer 0.6.  BNP is 472.   Reviewed the portable chest x-ray, which shows cardiomegaly and mild  chronic interstitial changes.  Reviewed the EKG, which shows sinus  rhythm, with left anterior fascicular block, and marked negative T-waves  and ST depression in V3-V6.   ASSESSMENT AND PLAN:  1. A 46-year gentleman with new onset congestive heart failure,      subacute in nature.  He does have multiple possibility etiologies      is for his congestive heart failure.  He could have considerable      ischemic heart disease, based on his tobacco use, family history,      morbid obesity, and  probable hypertension.  Also, the patient could      have a dilated cardiomyopathy secondary to alcohol use.  Also, it      is conceivable that [name] [name] could be suffering effects from chronic      cocaine use.  It is not impossible that we are also dealing with      the aftereffects of an untreated obstructive sleep apnea.  I doubt      that we are dealing here with a viral cardiomyopathy or an      endocrine cardiomyopathy.  My plan is to admit the patient to the      telemetry unit.  Cycle cardiac enzymes.  Keep him on close      monitoring.  Treat him with intravenous doses of furosemide and      calcium channel blocker.  Avoid beta blockers due to cocaine use.      Nitrates to be used if the patient has worsening symptoms.      Transthoracic echocardiogram, TSH, LFTs to be drawn tonight.      Cardiology consultation was called with the [facility]      group.  2. Obstructive sleep apnea.  The patient will be scheduled for an      outpatient sleep study.  3. History of alcohol abuse.  We will check the liver function tests      and start the patient on thiamine and folic acid.  I have      personally counseled the patient regarding his risks associated      with the alcohol abuse.      [name] [name], M.D.  Electronically Signed     SL/MEDQ  D:  12/10/2007  T:  12/11/2007  Job:  [id number]   cc:   [name] [name], M.D.

## 2011-01-31 NOTE — Assessment & Plan Note (Signed)
[facility]                          CHRONIC HEART FAILURE NOTE   NAME:Grade, HELIO                          MRN:          [id number]  DATE:03/26/2008                            DOB:          September 05, 1961    PRIMARY CARDIOLOGIST:  [name]. [name], MD   PRIMARY CARE PHYSICIAN:  Windle Guard, M.D.   [name] returns today for further followup of his congestive  heart failure which is secondary to nonischemic cardiomyopathy in the  setting of previous polysubstance abuse.  [name] [name] has been doing quite  well over the last few months and tolerating titration of medications  without side effects.  He has been compliant with request to avoid crack  cocaine.  He is still drinking occasionally.  He states he had alcohol  last on March 21, 2008.  He had a few beers.  Cigarettes, has an  occasional cigarette, but states he is trying really hard to completely  quit.  He has had some increased stress in his life with 4 year old son  that he finally had to ask to leave the house.  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  He has actually done quite well  over the last few months.  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  He states he does it in sections, takes breaks, but is able to  complete the job without any problems.  He is also helping his father  plant some trees, does some light shoveling.  Mild dyspnea with  exertion, but tolerating without problems.  He avoids the heat of the  day.  I had [name] [name] tested for sleep apnea and he is diagnosed with  obstructive sleep apnea and is pending CPAP initiation with [facility].  He denies any lightheadedness, dizziness, presyncope,  syncopal episodes, tolerating medicines without problems.  Denies any  symptoms suggestive of volume overload.  Last blood work I checked in  May: potassium was 4, BUN and creatinine 9 and 0.9, and BMP was 218.  He  is due for blood  work next month   PAST MEDICAL HISTORY:  1. Congestive heart failure, nonischemic with EF of 20% status post      cardiac catheterization and 2-D echocardiogram, cardiac      catheterization showing minimal coronary plaque.  2. History of polysubstance abuse including alcohol, marijuana,      tobacco, and cocaine.  3. Morbid obesity.  4. Diagnosed obstructive sleep apnea, pending CPAP initiation  5. Hypertension.  6. History of nonsustained V tach during recent hospitalization.   REVIEW OF SYSTEMS:  As stated above, otherwise negative.   CURRENT MEDICATIONS:  1. Carvedilol 12.5 mg b.i.d.  2. Multivitamin daily.  3. Folate daily.  4. KCl 20 daily.  5. Lasix 40 daily.  6. Lisinopril 20 daily.  7. Digoxin 0.25 daily.  8. Aspirin 81 daily.   PHYSICAL EXAMINATION:  VITALS:  Weight 340 pounds.  The patient's weight  is down 4 pounds, blood pressure 130/76 with a heart rate of 78.  [name] has no  signs of JVD at a 45-degree angle.  LUNGS:  Clear to auscultation, somewhat distant breath sounds.  CARDIOVASCULAR:  Reveals S1 and S2.  Regular rate and rhythm.  ABDOMEN:  Obese, soft, nontender, positive bowel sounds.  LOWER EXTREMITIES:  Without clubbing, cyanosis or edema.  NEUROLOGIC:  Alert and oriented x3   IMPRESSION:  Congestive heart failure secondary to nonischemic  cardiomyopathy.  We will titrate carvedilol up to 25 mg b.i.d. over the  next 3 weeks.  I will plan on repeating 2-D echocardiogram next month  and also check blood work at that time including a BMET, BMP, digoxin  and TSH level.  [name] has continued to do quite well.  Hopefully, he  will continue to abstain from polysubstance abuse.  I suspect that his  ejection fraction will have improved with pending echocardiogram, if  not, we will refer to Electrophysiology for further consideration of  implantable cardioverter-defibrillator.      [name] [name], ACNP  Electronically Signed      [name] Rotunda, MD,  [facility]  Electronically Signed   MB/MedQ  DD: 03/26/2008  DT: 03/26/2008  Job #: [id number]   cc:   Windle Guard, M.D.

## 2011-01-31 NOTE — Assessment & Plan Note (Signed)
[facility]                            CARDIOLOGY OFFICE NOTE   NAME:[name] [name], [name] [name]                          MRN:          [id number]  DATE:07/28/2008                            DOB:          1961-01-07    INTERVAL HISTORY:  [name] [name] is a very pleasant 50 year old male with a  history of congestive heart failure secondary to nonischemic  cardiomyopathy.  Ejection fraction was previously 10-15%, now 30-40%.  Also, he has a history of morbid obesity, polysubstance abuse,  hypertension, and severe sleep apnea.   He has been followed closely by Ms. Dorian Pod in the Heart Failure  Clinic.   He returns today for routine followup.  He said he feels great.  He has  started CPAP about a month ago and it has totally changed his life.  He  has much more energy.  He is walking about 3 miles every other day  without having to stop.  He denies any orthopnea or PND.  He does have  some trivial lower extremity edema.  He denies syncope or presyncope.  No chest pain.   CURRENT MEDICATIONS:  1. Aspirin 81 a day.  2. Digoxin 0.25 a day.  3. Lasix 40 a day.  4. Potassium 20 a day.  5. Folate 1 mg a day.  6. Multivitamin.  7. Coreg 25 once a day.  8. Lisinopril 40 a day.   PHYSICAL EXAMINATION:  GENERAL:  He is in no acute distress and  ambulates around the clinic slowly without any respiratory difficulty.  VITAL SIGNS:  Blood pressure is 120/76, heart rate is 81, and weight is  338.  HEENT:  Normal.  NECK:  Supple.  No obvious JVD, quiet hard to tell given his size of his  neck.  CARDIAC:  PMI is nonpalpable.  He is regular with no murmurs, rubs, or  gallops.  Cardiac is regular with frequent PVCs.  There is soft mitral  regurgitation on post PVC beats.  LUNGS:  Clear.  ABDOMEN:  Obese, nontender, and nondistended.  No obvious  hepatosplenomegaly, but once again it is hard to tell given his size.  EXTREMITIES:  Warm with no cyanosis, clubbing, or edema.   No rash.  NEURO:  Alert and oriented x3.  Cranial nerves II-XII are intact.  Moves  all 4 extremities without difficulty.  Affect is pleasant.   EKG shows normal sinus rhythm with occasional PVCs.  There is diffuse T-  wave inversions anterolaterally, which are old.   ASSESSMENT AND PLAN:  1. Congestive heart failure secondary to nonischemic cardiomyopathy.      His volume status looks good.  He is [place] Heart Association      class I.  He is having some confusion about his medications.  In      order to simplify things, we will stop his digoxin and put him on      Coreg 25 b.i.d. and lisinopril 40 a day.  We have written him      prescriptions for this.  We will have him follow back up in  Heart      Failure Clinic in 2 months with an echocardiogram.  2. Sleep apnea.  He is doing great with continuous positive airway      pressure.  I have congratulated him on this.  3. History of polysubstance abuse.  He has apparently stopped.  He is      no longer smoking.  4. History of nonsustained ventricular tachycardia, this is stable.      Ejection fraction is borderline.  We will repeat his echocardiogram      in the near future.  Obviously, if his ejection fraction is less      than 35%, we are going to need to consider a possible defibrillator      implantation.     [name]. [name], MD  Electronically Signed    DRB/MedQ  DD: 07/28/2008  DT: 07/28/2008  Job #: [id number]

## 2011-01-31 NOTE — H&P (Signed)
NAME:  [name] [name], [name] [name]                 ACCOUNT NO.:  [id number]   MEDICAL RECORD NO.:  [id number]          PATIENT TYPE:  INP   LOCATION:  1824                         FACILITY:  [facility]   PHYSICIAN:  [name]. [name], MDDATE OF BIRTH:  1961/04/15   DATE OF ADMISSION:  12/10/2007  DATE OF DISCHARGE:                              HISTORY & PHYSICAL   .   CARDIOLOGIST:  New, being seen by [name]. [name], MD   PRIMARY CARE PHYSICIAN:  Windle Guard, M.D.   Were asked to consult on [name] [name] by the [employer].  He is a very  pleasant 50 year old Caucasian gentleman with no known history of  coronary artery disease or heart failure who presents to [facility]  emergency room today at the insistence of his parents for evaluation of  symptoms that started approximately 1 month ago.  Symptoms include  insomnia, orthopnea, dyspnea with minimal exertion, peripheral edema,  abdominal bloating, cough and night sweats.  [name] [name] is a very pleasant  gentleman who unfortunately has made some poor decisions recently. He  has been using crack cocaine, states he last used last weekend with some  friends. Also consumed several six-packs of beer and smoked marijuana.  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 That is when his family insisted he  come in. Please note, his parents are not aware of his polysubstance  abuse.  This is to be kept confidential. [name] [name] states he was laid off  from his job about a month ago, has been doing some light physical labor  with his father; but, as stated above, today it became impossible to  continue. He has been sleeping in the recliner for the last 2 weeks  secondary to orthopnea.   PAST MEDICAL HISTORY:  1. Morbid obesity.  2. Ongoing substance abuse.  3.  Obstructive sleep apnea.  The patient states he was evaluated about      a year or two ago and was told he had sleep apnea, but he never      followed up to get fitted for CPAP.  4. Borderline hypertension.  5. Left knee pain relieved with ibuprofen p.r.n.   ALLERGIES:  No known drug allergies.   MEDICATIONS:  Here, he has been started on Lasix.  He has been given 40  mg IV x1 dose.  He has thiamine, folic acid, Cardizem, Lovenox, Protonix  and aspirin ordered.   SOCIAL HISTORY:  Lives in [place] with his son.  He has a girlfriend.  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  He smokes he says one to two cigarettes a week now but uses marijuana on  the weekends. Alcohol:  He drinks a six-pack every day.  Cocaine is  limited the weekends.  No diet restrictions.   FAMILY HISTORY:  Mother alive  and well and is here today. She had bypass  in her 58s.  Father is alive and well with no known coronary artery  disease.  He has a sibling with obstructive sleep apnea.   REVIEW OF SYSTEMS:  Positive for night sweats, shortness of breath,  dyspnea on exertion, orthopnea, edema, cough, wheezing, nocturia and  knee pain, abdominal bloating. All other systems reviewed and negative.   PHYSICAL EXAMINATION:  VITAL SIGNS:  Temperature 97.4, pulse 90,  respirations 24, blood pressure 160/103, saturation 96% on room air.  He  drops to 91 with minimal exertion.  GENERAL:  He is dyspneic while conversing.  HEENT:  Normal.  NECK:  Supple without lymphadenopathy or bruits.  He has JVD around, 10-  12 cm at 45-degree angle.  HEART:  He has a S1-S2, positive S3.  LUNGS:  He has crackles in bilateral bases.  SKIN:  Warm and dry.  ABDOMEN:  Obese, distended, soft, nontender.  EXTREMITIES:  Lower Extremities without clubbing, cyanosis.  He has +1  pitting edema bilaterally.  NEUROLOGIC:  Alert and oriented x3.   Chest x-ray showing CHF with minimal interstitial edema and  chronic  bronchitic changes.   EKG:  Sinus rhythm with left anterior fascicular block, T-wave  inversions anterior lateral leads at a rate of 87.   Preliminary lab work:  Hemoglobin 14.3, hematocrit 42, platelets to  25,000.  Sodium 141, potassium 3.7, BUN 10, creatinine 1.1 glucose 114,  D-dimer 0.59.  BNP 472.  Point-of-care negative first set of cardiac  enzymes. CK total 132, CK-MB 4.1, troponin 0.05.   IMPRESSION:  1. Congestive heart failure, new onset of unclear etiology at this      time. The patient will need right and left cardiac catheterization      for further evaluation and 2-D echocardiogram.  2. Ongoing polysubstance abuse.  3. Hypertension.  4. Obstructive sleep apnea.  5. Morbid obesity.   The patient is being admitted by the [facility] team to telemetry.  He  will need a 2-D echocardiogram.  Bedside echocardiogram done today shows  a probable EF of 30%. Right and left cardiac catheterization risks and  benefits have been discussed with the patient.  The patient agrees to  proceed.  He will need full-strength aspirin 325 mg at this time. If the  patient is being nonischemic, we can reduce aspirin to 81 mg. Initiate  low-dose ACE inhibitor, continue diuretics, will increase dose to 80 mg  IV b.i.d.  Will also establish beta blocker therapy and using a beta 1  selective drug, Coreg. Smoking cessation along with heart failure  education. Will use nitroglycerin and morphine to  bring blood pressure under control. Supplement potassium.  I will  followup the patient outpatient in the heart failure clinic and arrange  for obstructive sleep apnea evaluation through [facility].  Dr. Nicholes Mango has been in to examine and assess the  patient, agrees with plan of care.      Dorian Pod, ACNP      [name]. [name], MD  Electronically Signed    MB/MEDQ  D:  12/10/2007  T:  12/11/2007  Job:  [id number]

## 2011-01-31 NOTE — Assessment & Plan Note (Signed)
[facility]                          CHRONIC HEART FAILURE NOTE   NAME:[name] [name], [name] [name]                          MRN:          [id number]  DATE:01/17/2008                            DOB:          [birth year]    PRIMARY CARDIOLOGIST:  Dr. Nicholes Mango.   PRIMARY CARE PHYSICIAN:  Dr. Windle Guard.   HISTORY OF PRESENT ILLNESS:  [name] [name] is new to the heart failure clinic.  He is a 50 year old Caucasian gentleman with a past medical history of  obesity status post recent hospitalization where he was diagnosed with  new-onset congestive heart failure.  During that hospitalization, he  underwent a 2-D echocardiogram and a cardiac catheterization.  The  patient was found to have minimal coronary plaque with severe left  ventricular dysfunction and mildly elevated right-sided pressures.  The  patient underwent medical management for his nonischemic cardiomyopathy.  [name] [name] congestive heart failure was most likely multifactorial  secondary to uncontrolled hypertension, untreated sleep apnea, EtOH and  cocaine use.  [name] [name] was discharged home from [facility] cone and had an  appointment to follow up with me here in the heart failure clinic on  April 7.  He called and cancelled that and rescheduled for today.  He  states he has been doing quite well since he was discharged home.  States his breathing is much better.  When we consulted on him in the  emergency room, he was extremely dyspneic.  His urine drug screen had  come back positive for cocaine.  The patient was brought to the  emergency room by his elderly parents who were unaware of his substance  abuse issues.  [name] [name] states he has been clean since he was discharged  home, that that was just a one-time incident.  He unfortunately  continues to consume tobacco and alcohol.  He states he drinks a six-  pack over 3 days.  He has been able unable to work.  He previously had  been laid off, but then with his  heart failure symptoms.  He states he  gets too tired, easily fatigued and short of breath with exertion.  In  review of [name] [name] medications with him, apparently he is only taking  his Coreg once a day.  He misunderstood he was supposed to be taking it  twice a day, although his discharge instructions clearly state that,  otherwise he states compliance with his medications.   PAST MEDICAL HISTORY:  1. New-onset congestive heart failure nonischemic in nature with EF      20% status post cardiac catheterization and 2-D echocardiogram.      The patient with minimal coronary plaque.  2. Polysubstance abuse including alcohol and cocaine and marijuana      every weekend.  3. Morbid obesity.  4. History of tobacco use.  5. Obstructive sleep apnea, untreated at this time.  6. Hypertension with poor management  7. History of nonsustained V-tach during recent hospitalization.   REVIEW OF SYSTEMS:  As stated above, otherwise negative.   CURRENT MEDICATIONS:  1. Aspirin 81 mg daily.  2. Coreg 3.125 mg.  The patient is taking it daily, should be b.i.d.  3. Digoxin 0.25 mg daily.  4. Lisinopril 20 mg daily.  5. Lasix 40 mg daily.  6. KCl 20 mEq daily.  7. Folate 1 mg daily.  8. Multivitamin daily.   PHYSICAL EXAMINATION:  GENERAL:  [name] [name] is in no acute distress.  VITAL SIGNS:  Weight is 344 pounds, blood pressure 129/82 with a heart  rate of 90.  NECK:  [name] [name] has no signs of jugular vein distention at 45 degrees.  CARDIOVASCULAR:  He has an S1-S2.  I do not hear a S3 of this time.  LUNGS:  Clear to auscultation bilaterally.  SKIN:  Warm and dry.  ABDOMEN:  Obese, soft, nontender.  LOWER EXTREMITIES:  Without clubbing, cyanosis or edema.  NEUROLOGICAL:  Alert and oriented x3.   IMPRESSION:  1. Congestive heart failure secondary to nonischemic cardiomyopathy      with polysubstance abuse.  The patient has had his initial CHF      education.  I have given him a heart failure packet  to take home.      I have asked him to weigh daily, call me if his weight increases.      Patient has been educated on the importance of his medications,      diet, fluid restriction and adherence to recommendations.I have      asked him top take his Coreg BID and I will see him back in 3      weeks. Patient also needs to follow up on OSA,will arrange for      this.      Dorian Pod, ACNP  Electronically Signed      [name], MD, [facility]  Electronically Signed   MB/MedQ  DD: 01/20/2008  DT: 01/20/2008  Job #: [id number]   cc:   Windle Guard, M.D.

## 2011-02-17 ENCOUNTER — Ambulatory Visit: Payer: PRIVATE HEALTH INSURANCE | Admitting: Cardiology

## 2011-02-24 ENCOUNTER — Other Ambulatory Visit: Payer: Self-pay

## 2011-02-24 MED ORDER — FUROSEMIDE 40 MG PO TABS: 40.0000 mg | ORAL_TABLET | Freq: Every day | ORAL | Status: DC

## 2011-03-04 ENCOUNTER — Other Ambulatory Visit: Payer: Self-pay | Admitting: Cardiology

## 2011-03-07 ENCOUNTER — Other Ambulatory Visit: Payer: Self-pay | Admitting: Cardiology

## 2011-03-07 MED ORDER — LISINOPRIL 20 MG PO TABS: 20.0000 mg | ORAL_TABLET | Freq: Two times a day (BID) | ORAL | Status: DC

## 2011-05-12 ENCOUNTER — Ambulatory Visit (HOSPITAL_BASED_OUTPATIENT_CLINIC_OR_DEPARTMENT_OTHER): Admission: RE | Admit: 2011-05-12 | Payer: PRIVATE HEALTH INSURANCE | Source: Ambulatory Visit | Admitting: Urology

## 2011-05-12 ENCOUNTER — Ambulatory Visit (HOSPITAL_COMMUNITY): Admission: RE | Admit: 2011-05-12 | Payer: PRIVATE HEALTH INSURANCE | Source: Ambulatory Visit

## 2011-05-24 ENCOUNTER — Encounter: Payer: Self-pay | Admitting: Physician Assistant

## 2011-05-24 ENCOUNTER — Ambulatory Visit (INDEPENDENT_AMBULATORY_CARE_PROVIDER_SITE_OTHER): Payer: PRIVATE HEALTH INSURANCE | Admitting: Physician Assistant

## 2011-05-24 VITALS — BP 146/92 | HR 66 | Ht 76.0 in | Wt 321.4 lb

## 2011-05-24 DIAGNOSIS — Z01810 Encounter for preprocedural cardiovascular examination: Secondary | ICD-10-CM

## 2011-05-24 DIAGNOSIS — I2510 Atherosclerotic heart disease of native coronary artery without angina pectoris: Secondary | ICD-10-CM

## 2011-05-24 DIAGNOSIS — I10 Essential (primary) hypertension: Secondary | ICD-10-CM

## 2011-05-24 DIAGNOSIS — I5022 Chronic systolic (congestive) heart failure: Secondary | ICD-10-CM

## 2011-05-24 DIAGNOSIS — N433 Hydrocele, unspecified: Secondary | ICD-10-CM | POA: Insufficient documentation

## 2011-05-24 DIAGNOSIS — F1011 Alcohol abuse, in remission: Secondary | ICD-10-CM

## 2011-05-24 MED ORDER — CARVEDILOL 25 MG PO TABS: 25.0000 mg | ORAL_TABLET | Freq: Two times a day (BID) | ORAL | Status: DC

## 2011-05-24 NOTE — Assessment & Plan Note (Signed)
He needs surgical correction with Dr. Vernie Ammons.  He currently does not have any unstable cardiac conditions.  He had minimal coronary plaque at cath in 2009.  He is not having any angina.  He has chronic systolic heart failure, but is currently stable from a volume standpoint.  He is currently class 1-2.  He can clearly achieve 4 METS or greater without limitations.  According to ACC/AHA guidelines, he requires no further cardiac workup prior to his noncardiac procedure.  He should be at acceptable risk.  Close attention will need to be paid to his volume status to prevent volume overload.  Our service will be available as necessary.

## 2011-05-24 NOTE — Assessment & Plan Note (Signed)
We discussed the need to quit.

## 2011-05-24 NOTE — Assessment & Plan Note (Signed)
Increase coreg to 25 mg BID. 

## 2011-05-24 NOTE — Assessment & Plan Note (Signed)
Minimal plaque at cath in 2009.  No angina.  Continue ASA.

## 2011-05-24 NOTE — Assessment & Plan Note (Signed)
I have advised him to refrain from alcohol given his NICM.

## 2011-05-24 NOTE — Patient Instructions (Signed)
Your physician recommends that you schedule a follow-up appointment in: 07/28/11 @ 9:45 to see Dr. Antoine Poche  Your physician has recommended you make the following change in your medication: increase coreg 25 mg 1 tablet twice daily

## 2011-05-24 NOTE — Progress Notes (Signed)
History of Present Illness: Primary Cardiologist:  Dr. Donzetta Kohut [name] is a 50 y.o. male presents for surgical clearance.  He has a history of nonischemic cardiomyopathy, nonobstructive CAD by cardiac catheterization 3/09 (proximal LAD 25%, EF 20%), systolic heart failure, hypertension, sleep apnea, ventricular tachycardia, tobacco and alcohol abuse.  Last echocardiogram 4/12: EF 25-30%, grade 2 diastolic dysfunction, dilated aortic root at 43 mm, trivial MR, moderate LAE.  He needs a left hydrocelectomy.  His urologist is Dr. Vernie Ammons.  He denies chest pain, dyspnea, syncope, orthopnea, PND or edema.  He works in a Programme researcher, broadcasting/film/video and can lift heavy objects and work in the heat without chest pain or SOB.  He can vacuum a room or go up steps without dyspnea.  He can push mow his lawn as well without symptoms.  He is taking all of his medications.  Past Medical History  Diagnosis Date  . HTN (hypertension)   . OSA on CPAP   . CAD (coronary artery disease)     Mild nonobstructive (Cath 09)  . Ventricular tachycardia   . Dyspnea   . Obesity, morbid   . Chronic systolic heart failure     Nonischemic CM: echo 4/12 with EF 25-30%, grade 2 diast dysfxn, mild dilated aortic root 43 mm, trivial MR, mod LAE  . Hypersomnia   . Tobacco use disorder   . H/O ETOH abuse     Current Outpatient Prescriptions  Medication Sig Dispense Refill  . aspirin 81 MG tablet Take 81 mg by mouth daily. occasionally      . furosemide (LASIX) 40 MG tablet Take 1 tablet (40 mg total) by mouth daily.  30 tablet  6  . furosemide (LASIX) 40 MG tablet TAKE 1 TABLET EVERY DAY  30 tablet  6  . KLOR-CON M20 20 MEQ tablet TAKE 1 TABLET BY MOUTH EVERY DAY  30 tablet  9  . lisinopril (PRINIVIL,ZESTRIL) 20 MG tablet Take 1 tablet (20 mg total) by mouth 2 (two) times daily.  60 tablet  6  . DISCONTD: carvedilol (COREG) 12.5 MG tablet Take 1 tablet (12.5 mg total) by mouth 2 (two) times daily.  30 tablet  11  .  carvedilol (COREG) 25 MG tablet Take 1 tablet (25 mg total) by mouth 2 (two) times daily.  60 tablet  11    Allergies: No Known Allergies  Social history:  He continues to smoke cigarettes.  He also continues to drink beer on the weekends (12 pack per weekend)  ROS:  Please see the history of present illness.  He has some occasional BRBPR 2/2 hemorrhoids.  Otherwise, all other systems reviewed and negative.   Vital Signs: BP 146/92  Pulse 66  Ht 6\' 4"  (1.93 m)  Wt 321 lb 6.4 oz (145.786 kg)  BMI 39.12 kg/m2  PHYSICAL EXAM: Well nourished, well developed, in no acute distress HEENT: normal Neck: no JVD Vascular:  No carotid bruits Cardiac:  normal S1, S2; RRR; no murmur Lungs:  clear to auscultation bilaterally, no wheezing, rhonchi or rales Abd: soft, nontender, no hepatomegaly Ext: no edema Skin: warm and dry Neuro:  CNs 2-12 intact, no focal abnormalities noted Psych: normal affect  EKG:  Sinus rhythm, heart rate 66, Left axis deviation, poor R-wave progression, and nonspecific interventricular conduction delay, and T-wave inversions in V4-V6, no significant change compared to prior tracing  ASSESSMENT AND PLAN:

## 2011-05-24 NOTE — Assessment & Plan Note (Signed)
Stable.  Titrate coreg to 25 mg BID.  As noted, care needs to be taken with volume management during his procedure to prevent volume overload.  Follow up with Dr. Antoine Poche in 2 months.

## 2011-06-05 ENCOUNTER — Other Ambulatory Visit: Payer: Self-pay | Admitting: Urology

## 2011-06-05 ENCOUNTER — Encounter (HOSPITAL_COMMUNITY): Payer: PRIVATE HEALTH INSURANCE

## 2011-06-05 ENCOUNTER — Other Ambulatory Visit (HOSPITAL_COMMUNITY): Payer: Self-pay | Admitting: Urology

## 2011-06-05 ENCOUNTER — Ambulatory Visit (HOSPITAL_COMMUNITY)
Admission: RE | Admit: 2011-06-05 | Discharge: 2011-06-05 | Disposition: A | Discharge status: Home / Self Care | Payer: PRIVATE HEALTH INSURANCE | Source: Ambulatory Visit | Attending: Urology | Admitting: Urology

## 2011-06-05 DIAGNOSIS — Z01812 Encounter for preprocedural laboratory examination: Secondary | ICD-10-CM | POA: Insufficient documentation

## 2011-06-05 DIAGNOSIS — I10 Essential (primary) hypertension: Secondary | ICD-10-CM | POA: Insufficient documentation

## 2011-06-05 DIAGNOSIS — Z01818 Encounter for other preprocedural examination: Secondary | ICD-10-CM | POA: Insufficient documentation

## 2011-06-05 DIAGNOSIS — G4730 Sleep apnea, unspecified: Secondary | ICD-10-CM | POA: Insufficient documentation

## 2011-06-05 DIAGNOSIS — N433 Hydrocele, unspecified: Secondary | ICD-10-CM | POA: Insufficient documentation

## 2011-06-05 DIAGNOSIS — F17200 Nicotine dependence, unspecified, uncomplicated: Secondary | ICD-10-CM | POA: Insufficient documentation

## 2011-06-05 LAB — SURGICAL PCR SCREEN
MRSA, PCR: POSITIVE — AB
Staphylococcus aureus: POSITIVE — AB

## 2011-06-05 LAB — CBC
HCT: 40.7 % (ref 39.0–52.0)
Hemoglobin: 14.2 g/dL (ref 13.0–17.0)
MCH: 32.1 pg (ref 26.0–34.0)
MCHC: 34.9 g/dL (ref 30.0–36.0)
MCV: 92.1 fL (ref 78.0–100.0)
Platelets: 137 10*3/uL — ABNORMAL LOW (ref 150–400)
RBC: 4.42 MIL/uL (ref 4.22–5.81)
RDW: 13.6 % (ref 11.5–15.5)
WBC: 3.3 10*3/uL — ABNORMAL LOW (ref 4.0–10.5)

## 2011-06-05 LAB — BASIC METABOLIC PANEL
BUN: 14 mg/dL (ref 6–23)
CO2: 30 mEq/L (ref 19–32)
Calcium: 9.3 mg/dL (ref 8.4–10.5)
Chloride: 101 mEq/L (ref 96–112)
Creatinine, Ser: 0.61 mg/dL (ref 0.50–1.35)
GFR calc Af Amer: >60 mL/min (ref >60)
GFR calc non Af Amer: >60 mL/min (ref >60)
Glucose, Bld: 175 mg/dL — ABNORMAL HIGH (ref 70–99)
Potassium: 4 mEq/L (ref 3.5–5.1)
Sodium: 136 mEq/L (ref 135–145)

## 2011-06-09 ENCOUNTER — Ambulatory Visit (HOSPITAL_COMMUNITY)
Admission: RE | Admit: 2011-06-09 | Discharge: 2011-06-09 | Disposition: A | Discharge status: Home / Self Care | Payer: PRIVATE HEALTH INSURANCE | Source: Ambulatory Visit | Attending: Urology | Admitting: Urology

## 2011-06-09 DIAGNOSIS — F17200 Nicotine dependence, unspecified, uncomplicated: Secondary | ICD-10-CM | POA: Insufficient documentation

## 2011-06-09 DIAGNOSIS — Z01812 Encounter for preprocedural laboratory examination: Secondary | ICD-10-CM | POA: Insufficient documentation

## 2011-06-09 DIAGNOSIS — Z01818 Encounter for other preprocedural examination: Secondary | ICD-10-CM | POA: Insufficient documentation

## 2011-06-09 DIAGNOSIS — I10 Essential (primary) hypertension: Secondary | ICD-10-CM | POA: Insufficient documentation

## 2011-06-09 DIAGNOSIS — N433 Hydrocele, unspecified: Secondary | ICD-10-CM | POA: Insufficient documentation

## 2011-06-09 DIAGNOSIS — G4733 Obstructive sleep apnea (adult) (pediatric): Secondary | ICD-10-CM | POA: Insufficient documentation

## 2011-06-11 NOTE — Op Note (Signed)
  NAME:  [name] [name], [name] [name]                 ACCOUNT NO.:  [id number]  MEDICAL RECORD NO.:  [id number]  LOCATION:  DAYL                         FACILITY:  [facility]  PHYSICIAN:  Analeah Brame C. Vernie Ammons, M.D.  DATE OF BIRTH:  [birth year]  DATE OF PROCEDURE: DATE OF DISCHARGE:                              OPERATIVE REPORT   DATE OF PROCEDURE:  June 09, 2011.  PREOPERATIVE DIAGNOSIS:  Left hydrocele.  POSTOP DIAGNOSIS:  Left hydrocele.  PROCEDURE:  Left hydrocelectomy.  SURGEON:  Huxley Vanwagoner C. Vernie Ammons, M.D.  ANESTHESIA:  General with local supplement.  SPECIMENS:  None.  BLOOD LOSS:  Minimal.  DRAINS:  None.  COMPLICATIONS:  None.  INDICATIONS:  The patient is a 50 year old male with a large, symptomatic left hydrocele.  We discussed the treatment options and he has elected to proceed with surgical correction.  The risks, complications, and alternatives have been discussed.  DESCRIPTION OF OPERATION:  After informed consent, the patient was brought to the major OR, placed on the table and administered general anesthesia.  He then underwent a routine Betadine scrotal prep and draping.  An official time-out was then performed.  A median-raphe midline scrotal incision was then made and I then dissected the tissue from around the hydrocele and then used blunt technique to further isolate the hydrocele.  It was delivered through the incision, and opened and drained of several 100 mL of clear amber fluid.  I then incised this in the midline and delivered the testicle, which was noted to be normal as was the epididymis.  The appendix testis was fulgurated.  I then excised redundant parietal tunica and cauterized the edges.  I then closed the two edges of the parietal tunica vaginalis behind the testicle with running, locking 3-0 chromic.  I then replaced the testicle in its normal anatomic position in the left hemiscrotum and inspected the scrotum for bleeding.  Any bleeding points were  cauterized with electrocautery and I then closed the deep scrotal tissue over the testicle with running 3-0 chromic suture.  I injected 0.5% plain Marcaine in the subcutaneous tissue and then closed the skin with running 3-0 chromic suture.  A sterile gauze dressing as well as fluffed Kerlix were then applied as well as a scrotal support and the patient was awakened and taken to recovery room in stable and satisfactory condition.  He tolerated the procedure well with no intraoperative complications.  He was given written discharge instructions and a prescription for Tylox #30 and doxycycline 100 mg b.i.d. #6 with followup my office in 1 week.     Ardeth Repetto C. Vernie Ammons, M.D.     MCO/MEDQ  D:  06/09/2011  T:  06/09/2011  Job:  [id number]  Electronically Signed by Ihor Gully M.D. on 06/11/2011 12:48:40 PM

## 2011-06-12 LAB — COMPREHENSIVE METABOLIC PANEL
AST: 18
Albumin: 3.7
Calcium: 9.2
Chloride: 103
Creatinine, Ser: 0.97
GFR calc Af Amer: >60
Total Bilirubin: 1.5 — ABNORMAL HIGH
Total Protein: 6.5

## 2011-06-12 LAB — POCT I-STAT 3, ART BLOOD GAS (G3+)
Acid-Base Excess: 7 — ABNORMAL HIGH
O2 Saturation: 86
Operator id: 284701

## 2011-06-12 LAB — POCT I-STAT 3, VENOUS BLOOD GAS (G3P V)
O2 Saturation: 67
TCO2: 31
pCO2, Ven: 45.1

## 2011-06-12 LAB — BASIC METABOLIC PANEL
CO2: 31
Calcium: 9.3
Chloride: 101
GFR calc Af Amer: >60
GFR calc Af Amer: >60
GFR calc non Af Amer: >60
Glucose, Bld: 125 — ABNORMAL HIGH
Potassium: 3.2 — ABNORMAL LOW
Potassium: 3.8
Sodium: 142
Sodium: 142

## 2011-06-12 LAB — POCT I-STAT, CHEM 8
Chloride: 104
Glucose, Bld: 114 — ABNORMAL HIGH
HCT: 42
Potassium: 3.7
Sodium: 141

## 2011-06-12 LAB — TSH: TSH: 1.368

## 2011-06-12 LAB — LIPID PANEL
LDL Cholesterol: 90
Triglycerides: 115
VLDL: 23

## 2011-06-12 LAB — CARDIAC PANEL(CRET KIN+CKTOT+MB+TROPI)
CK, MB: 2.6
CK, MB: 3.1
Relative Index: 3 — ABNORMAL HIGH
Total CK: 103
Total CK: 76
Troponin I: 0.03

## 2011-06-12 LAB — CBC
HCT: 45
Hemoglobin: 14.5
Hemoglobin: 15.8
MCHC: 35.1
RBC: 4.44
RBC: 4.8
RDW: 13.9
RDW: 14.7
WBC: 6.5

## 2011-06-12 LAB — RAPID URINE DRUG SCREEN, HOSP PERFORMED
Barbiturates: NOT DETECTED
Opiates: POSITIVE — AB

## 2011-06-12 LAB — APTT: aPTT: 30

## 2011-06-12 LAB — B-NATRIURETIC PEPTIDE (CONVERTED LAB): Pro B Natriuretic peptide (BNP): 459 — ABNORMAL HIGH

## 2011-06-12 LAB — D-DIMER, QUANTITATIVE: D-Dimer, Quant: 0.59 — ABNORMAL HIGH

## 2011-06-12 LAB — PLATELET COUNT: Platelets: 225

## 2011-06-12 LAB — CK TOTAL AND CKMB (NOT AT ARMC)
CK, MB: 4.1 — ABNORMAL HIGH
Relative Index: 3.1 — ABNORMAL HIGH

## 2011-06-12 LAB — TROPONIN I: Troponin I: 0.05

## 2011-06-12 LAB — MAGNESIUM: Magnesium: 2.2

## 2011-06-12 LAB — POCT CARDIAC MARKERS
CKMB, poc: 1.7
Myoglobin, poc: 60.7
Operator id: 222501

## 2011-06-12 LAB — PROTIME-INR
INR: 1
Prothrombin Time: 13.3

## 2011-06-12 LAB — HEMOGLOBIN A1C: Mean Plasma Glucose: 122

## 2011-07-28 ENCOUNTER — Encounter: Payer: Self-pay | Admitting: Cardiology

## 2011-07-28 ENCOUNTER — Ambulatory Visit (INDEPENDENT_AMBULATORY_CARE_PROVIDER_SITE_OTHER): Payer: BC Managed Care – PPO | Admitting: Cardiology

## 2011-07-28 DIAGNOSIS — F17200 Nicotine dependence, unspecified, uncomplicated: Secondary | ICD-10-CM

## 2011-07-28 DIAGNOSIS — I5022 Chronic systolic (congestive) heart failure: Secondary | ICD-10-CM

## 2011-07-28 DIAGNOSIS — F1011 Alcohol abuse, in remission: Secondary | ICD-10-CM

## 2011-07-28 DIAGNOSIS — I10 Essential (primary) hypertension: Secondary | ICD-10-CM

## 2011-07-28 MED ORDER — CARVEDILOL 25 MG PO TABS: ORAL_TABLET | ORAL | Status: DC

## 2011-07-28 NOTE — Progress Notes (Signed)
HPI The patient for follow up of his cardiomyopathy.  He was seen prior to a hydrocele resection for preop clearance in Sept.  He had his beta blocker increased at that appt.  that time he has done well. He's not exercising but he remains active at work. He does yard work as well.  The patient denies any new symptoms such as chest discomfort, neck or arm discomfort. There has been no new shortness of breath, PND or orthopnea. There have been no reported palpitations, presyncope or syncope.  He still smokes four cigarettes daily and drinks beer on the weekend when he is watching the races.  No Known Allergies  Current Outpatient Prescriptions on File Prior to Visit  Medication Sig Dispense Refill  . aspirin 81 MG tablet Take 81 mg by mouth daily. occasionally      . carvedilol (COREG) 25 MG tablet Take 1 tablet (25 mg total) by mouth 2 (two) times daily.  60 tablet  11  . furosemide (LASIX) 40 MG tablet Take 1 tablet (40 mg total) by mouth daily.  30 tablet  6  . KLOR-CON M20 20 MEQ tablet TAKE 1 TABLET BY MOUTH EVERY DAY  30 tablet  9  . lisinopril (PRINIVIL,ZESTRIL) 20 MG tablet Take 1 tablet (20 mg total) by mouth 2 (two) times daily.  60 tablet  6    Past Medical History  Diagnosis Date  . HTN (hypertension)   . OSA on CPAP   . CAD (coronary artery disease)     Mild nonobstructive (Cath 09)  . Ventricular tachycardia   . Dyspnea   . Obesity, morbid   . Chronic systolic heart failure     Nonischemic CM: echo 4/12 with EF 25-30%, grade 2 diast dysfxn, mild dilated aortic root 43 mm, trivial MR, mod LAE  . Hypersomnia   . Tobacco use disorder   . H/O ETOH abuse    ROS  As stated in the HPI and negative for all other systems.  PHYSICAL EXAM BP 136/90  Pulse 68  Ht 6\' 2"  (1.88 m)  Wt 312 lb (141.522 kg)  BMI 40.06 kg/m2 GENERAL:  Well appearing HEENT:  Pupils equal round and reactive, fundi not visualized, oral mucosa unremarkable NECK:  No jugular venous distention, waveform  within normal limits, carotid upstroke brisk and symmetric, no bruits, no thyromegaly LYMPHATICS:  No cervical, inguinal adenopathy LUNGS:  Clear to auscultation bilaterally BACK:  No CVA tenderness CHEST:  Unremarkable HEART:  PMI not displaced or sustained,S1 and S2 within normal limits, no S3, no S4, no clicks, no rubs, no murmurs ABD:  Flat, positive bowel sounds normal in frequency in pitch, no bruits, no rebound, no guarding, no midline pulsatile mass, no hepatomegaly, no splenomegaly. obese EXT:  2 plus pulses throughout, no edema, no cyanosis no clubbing SKIN:  No rashes no nodules NEURO:  Cranial nerves II through XII grossly intact, motor grossly intact throughout PSYCH:  Cognitively intact, oriented to person place and time   ASSESSMENT AND PLAN

## 2011-07-28 NOTE — Assessment & Plan Note (Signed)
We again discussed the need to stop all cigarettes.

## 2011-07-28 NOTE — Assessment & Plan Note (Signed)
He understands the need to stop all cigarettes.  We discussed this again today.

## 2011-07-28 NOTE — Assessment & Plan Note (Addendum)
Wt Readings from Last 3 Encounters:  07/28/11 312 lb (141.522 kg)  05/24/11 321 lb 6.4 oz (145.786 kg)  12/14/10 324 lb (146.965 kg)   I am proud of the weight loss and he needs to keep going with diet and exercise.  We reviewed this.

## 2011-07-28 NOTE — Assessment & Plan Note (Signed)
I will increase the Coreg. The goal is 50 mg bid.  I will repeat an echo in the spring of next year.

## 2011-07-28 NOTE — Assessment & Plan Note (Signed)
This is being managed in the context of treating his CHF  

## 2011-07-28 NOTE — Patient Instructions (Signed)
Please increase your carvedilol to 25 mg 1 and 1/2 tablets twice a day. Continue all other medications twice a day  Follow up in January 2013 with Dr Antoine Poche

## 2011-09-23 ENCOUNTER — Other Ambulatory Visit: Payer: Self-pay | Admitting: Cardiology

## 2011-09-29 ENCOUNTER — Ambulatory Visit (INDEPENDENT_AMBULATORY_CARE_PROVIDER_SITE_OTHER): Payer: BC Managed Care – PPO | Admitting: Cardiology

## 2011-09-29 ENCOUNTER — Encounter: Payer: Self-pay | Admitting: Cardiology

## 2011-09-29 DIAGNOSIS — F17200 Nicotine dependence, unspecified, uncomplicated: Secondary | ICD-10-CM

## 2011-09-29 DIAGNOSIS — I428 Other cardiomyopathies: Secondary | ICD-10-CM

## 2011-09-29 DIAGNOSIS — F1011 Alcohol abuse, in remission: Secondary | ICD-10-CM

## 2011-09-29 NOTE — Assessment & Plan Note (Signed)
Today he will continue the meds as listed. He is euvolemic. I will check an echocardiogram in the spring and see him back.

## 2011-09-29 NOTE — Progress Notes (Signed)
   HPI The patient for follow up of his cardiomyopathy.  Since I last saw him he has done well.  The patient denies any new symptoms such as chest discomfort, neck or arm discomfort. There has been no new shortness of breath, PND or orthopnea. There have been no reported palpitations, presyncope or syncope.  He is still drinking a twelve pack per week and eats too much.  He smokes 3 -4 cigs per week.  No Known Allergies  Current Outpatient Prescriptions on File Prior to Visit  Medication Sig Dispense Refill  . aspirin 81 MG tablet Take 81 mg by mouth daily. occasionally      . furosemide (LASIX) 40 MG tablet Take 1 tablet (40 mg total) by mouth daily.  30 tablet  6  . KLOR-CON M20 20 MEQ tablet TAKE 1 TABLET BY MOUTH EVERY DAY  30 tablet  9  . lisinopril (PRINIVIL,ZESTRIL) 20 MG tablet TAKE 1 TABLET (20 MG TOTAL) BY MOUTH 2 (TWO) TIMES DAILY.  60 tablet  6  . DISCONTD: carvedilol (COREG) 25 MG tablet Take one and 1/2 tablet twice a day  90 tablet  11    Past Medical History  Diagnosis Date  . HTN (hypertension)   . OSA on CPAP   . CAD (coronary artery disease)     Mild nonobstructive (Cath 09)  . Ventricular tachycardia   . Dyspnea   . Obesity, morbid   . Chronic systolic heart failure     Nonischemic CM: echo 4/12 with EF 25-30%, grade 2 diast dysfxn, mild dilated aortic root 43 mm, trivial MR, mod LAE  . Hypersomnia   . Tobacco use disorder   . H/O ETOH abuse    ROS  As stated in the HPI and negative for all other systems.  PHYSICAL EXAM BP 115/70  Pulse 70  Ht 6\' 4"  (1.93 m)  Wt 317 lb (143.79 kg)  BMI 38.59 kg/m2 GENERAL:  Well appearing HEENT:  Pupils equal round and reactive, fundi not visualized, oral mucosa unremarkable NECK:  No jugular venous distention, waveform within normal limits, carotid upstroke brisk and symmetric, no bruits, no thyromegaly LYMPHATICS:  No cervical, inguinal adenopathy LUNGS:  Clear to auscultation bilaterally BACK:  No CVA  tenderness CHEST:  Unremarkable HEART:  PMI not displaced or sustained,S1 and S2 within normal limits, no S3, no S4, no clicks, no rubs, no murmurs ABD:  Flat, positive bowel sounds normal in frequency in pitch, no bruits, no rebound, no guarding, no midline pulsatile mass, no hepatomegaly, no splenomegaly. obese EXT:  2 plus pulses throughout, no edema, no cyanosis no clubbing SKIN:  No rashes no nodules NEURO:  Cranial nerves II through XII grossly intact, motor grossly intact throughout PSYCH:  Cognitively intact, oriented to person place and time  EKG:  Sinus rhythm, rate 70, left axis deviation, interventricular conduction delay, lateral T-wave inversions. Unchanged from previous. 09/29/2011  ASSESSMENT AND PLAN

## 2011-09-29 NOTE — Assessment & Plan Note (Signed)
He should avoid alcohol completely and we discussed this again.

## 2011-09-29 NOTE — Assessment & Plan Note (Signed)
We again discussed the need to stop smoking and he thinks he might quit if his son does.

## 2011-09-29 NOTE — Patient Instructions (Signed)
Your physician has requested that you have an echocardiogram. Echocardiography is a painless test that uses sound waves to create images of your heart. It provides your doctor with information about the size and shape of your heart and how well your heart's chambers and valves are working. This procedure takes approximately one hour. There are no restrictions for this procedure.  Continue current medications as listed.  Follow up with Dr Antoine Poche the same day as 2 D Echo.

## 2011-09-29 NOTE — Assessment & Plan Note (Signed)
We again discussed the need for weight loss with diet and exercise.

## 2011-09-29 NOTE — Assessment & Plan Note (Signed)
His blood pressure is controlled in the context of managing his cardiomyopathy. He will continue with meds as listed.

## 2011-10-26 ENCOUNTER — Ambulatory Visit (INDEPENDENT_AMBULATORY_CARE_PROVIDER_SITE_OTHER): Payer: BC Managed Care – PPO | Admitting: General Surgery

## 2011-10-26 ENCOUNTER — Encounter (INDEPENDENT_AMBULATORY_CARE_PROVIDER_SITE_OTHER): Payer: Self-pay

## 2011-10-26 ENCOUNTER — Encounter (INDEPENDENT_AMBULATORY_CARE_PROVIDER_SITE_OTHER): Payer: Self-pay | Admitting: General Surgery

## 2011-10-26 VITALS — BP 124/80 | Ht 76.0 in | Wt 319.6 lb

## 2011-10-26 DIAGNOSIS — K625 Hemorrhage of anus and rectum: Secondary | ICD-10-CM

## 2011-10-26 DIAGNOSIS — K648 Other hemorrhoids: Secondary | ICD-10-CM

## 2011-10-26 NOTE — Progress Notes (Signed)
Subjective:   Rectal bleeding and hemorrhoid prolapse  Patient ID: [name] [name], male   DOB: 08-25-1961, 51 y.o.   MRN: [id number]  HPI Patient is a 52 year old male self-referred. I've seen in the past for various issues. We have noted previously that he had symptomatic hemorrhoids with prolapse and bleeding. This has gotten steadily worse. He states that with each bowel movement he has prolapse of tissue that has to be manually reduced. About once a week he will have some bleeding. About once a month usually related to physical exertion this gets much worse with persistent prolapse and bleeding for a couple of days that response and over-the-counter medications. He does not have constipation or other change in his bowel habits. He's not had any previous hemorrhoid procedures. He does not pain or irritation.  Past Medical History  Diagnosis Date  . HTN (hypertension)   . OSA on CPAP   . CAD (coronary artery disease)     Mild nonobstructive (Cath 09)  . Ventricular tachycardia   . Dyspnea   . Obesity, morbid   . Chronic systolic heart failure     Nonischemic CM: echo 4/12 with EF 25-30%, grade 2 diast dysfxn, mild dilated aortic root 43 mm, trivial MR, mod LAE  . Hypersomnia   . Tobacco use disorder   . H/O ETOH abuse   . CHF (congestive heart failure)    Past Surgical History  Procedure Date  . Hydrocele excision / repair    Current Outpatient Prescriptions  Medication Sig Dispense Refill  . aspirin 81 MG tablet Take 81 mg by mouth daily. occasionally      . carvedilol (COREG) 25 MG tablet 2 tabs po bid      . furosemide (LASIX) 40 MG tablet Take 1 tablet (40 mg total) by mouth daily.  30 tablet  6  . KLOR-CON M20 20 MEQ tablet TAKE 1 TABLET BY MOUTH EVERY DAY  30 tablet  9  . lisinopril (PRINIVIL,ZESTRIL) 20 MG tablet TAKE 1 TABLET (20 MG TOTAL) BY MOUTH 2 (TWO) TIMES DAILY.  60 tablet  6  . PARoxetine (PAXIL) 20 MG tablet Take 20 mg by mouth every morning.       No Known  Allergies History  Substance Use Topics  . Smoking status: Current Some Day Smoker  . Smokeless tobacco: Not on file  . Alcohol Use: 3.6 oz/week    6 Cans of beer per week     Review of Systems  Respiratory: Positive for shortness of breath. Negative for chest tightness.   Cardiovascular: Negative for chest pain, palpitations and leg swelling.  Gastrointestinal: Negative for nausea, diarrhea, constipation, abdominal distention and rectal pain.       Objective:   Physical Exam BP 124/80  Ht 6\' 4"  (1.93 m)  Wt 319 lb 9.6 oz (144.97 kg)  BMI 38.90 kg/m2 General: Alert,Moderately obese Caucasian male, in no distress Skin: Warm and dry without rash or infection. HEENT: No palpable masses or thyromegaly. Sclera nonicteric. Pupils equal round and reactive. Oropharynx clear. Lymph nodes: No cervical, supraclavicular, or inguinal nodes palpable. Lungs: Breath sounds clear and equal without increased work of breathing Cardiovascular: Regular rate and rhythm without murmur. No JVD or edema. Peripheral pulses intact. Abdomen: Nondistended. Soft and nontender. No masses palpable. No organomegaly. No palpable hernias. Rectal: External rectal exam is unremarkable. Digital exam shows no tenderness mass or blood. Extremities: No edema or joint swelling or deformity. No chronic venous stasis changes. Neurologic: Alert and fully oriented.  Gait normal.   Anoscopy was performed. There are circumferential moderate internal hemorrhoids without friability or bleeding.    Assessment:     Internal hemorrhoids, grade 3, with prolapse and bleeding. These are very symptomatic and gradually worsening. I believe he would benefit from [facility] hemorrhoidectomy. We discussed the indications in nature this procedure. He needs a preoperative colonoscopy and cardiac clearance. I will see him back after the studies.    Plan:     As above

## 2011-10-26 NOTE — Patient Instructions (Signed)
Schedule office visit after your colonoscopy and we will discuss and arrange your surgery.

## 2011-11-16 ENCOUNTER — Other Ambulatory Visit: Payer: Self-pay | Admitting: Cardiology

## 2012-02-23 ENCOUNTER — Other Ambulatory Visit: Payer: Self-pay | Admitting: Cardiology

## 2012-06-04 ENCOUNTER — Other Ambulatory Visit: Payer: Self-pay | Admitting: Physician Assistant

## 2012-07-27 ENCOUNTER — Other Ambulatory Visit: Payer: Self-pay | Admitting: Cardiology

## 2012-08-13 ENCOUNTER — Other Ambulatory Visit: Payer: Self-pay | Admitting: Cardiology

## 2012-10-25 ENCOUNTER — Other Ambulatory Visit: Payer: Self-pay | Admitting: Cardiology

## 2012-11-04 ENCOUNTER — Other Ambulatory Visit: Payer: Self-pay

## 2012-11-04 MED ORDER — CARVEDILOL 25 MG PO TABS: ORAL_TABLET | ORAL | Status: DC

## 2012-11-04 MED ORDER — CARVEDILOL 25 MG PO TABS: 25.0000 mg | ORAL_TABLET | Freq: Two times a day (BID) | ORAL | Status: DC

## 2012-11-04 NOTE — Telephone Encounter (Signed)
..   Requested Prescriptions   Signed Prescriptions Disp Refills  . carvedilol (COREG) 25 MG tablet 180 tablet 3    Sig: Take 1 tablet (25 mg total) by mouth 2 (two) times daily with a meal. 2 tabs po bid    Authorizing Provider: [name]    Ordering User: Christella Hartigan, Hommer Cunliffe Judie Petit

## 2012-11-04 NOTE — Telephone Encounter (Signed)
..   Requested Prescriptions   Pending Prescriptions Disp Refills  . carvedilol (COREG) 25 MG tablet [Pharmacy Med Name: CARVEDILOL 25MG      TAB] 180 tablet 3    Sig: TAKE ONE & ONE-HALF TABLETS BY MOUTH TWICE DAILY

## 2012-11-04 NOTE — Telephone Encounter (Signed)
..   Requested Prescriptions   Pending Prescriptions Disp Refills  . carvedilol (COREG) 25 MG tablet 180 tablet 3    Sig: TAKE 1 AND 1/2 TABLETS TWICE A DAY  HAD TO REDO THE FIRST ONE WAS WRONG ON THE INSTRUCTIONS

## 2012-12-02 ENCOUNTER — Other Ambulatory Visit: Payer: Self-pay | Admitting: Cardiology

## 2012-12-03 NOTE — Telephone Encounter (Signed)
..   Requested Prescriptions   Pending Prescriptions Disp Refills  . lisinopril (PRINIVIL,ZESTRIL) 20 MG tablet [Pharmacy Med Name: LISINOPRIL 20MG      TAB] 60 tablet 1    Sig: TAKE ONE TABLET BY MOUTH TWICE DAILY  . furosemide (LASIX) 40 MG tablet [Pharmacy Med Name: FUROSEMIDE 40MG      TAB] 30 tablet 1    Sig: TAKE ONE TABLET BY MOUTH EVERY DAY  .Marland KitchenPatient needs to contact office to schedule  Appointment  for future refills.Ph:[phone]. Thank you.

## 2013-01-31 ENCOUNTER — Other Ambulatory Visit: Payer: Self-pay | Admitting: Cardiology

## 2013-03-11 ENCOUNTER — Other Ambulatory Visit: Payer: Self-pay | Admitting: Cardiology

## 2013-03-26 ENCOUNTER — Other Ambulatory Visit: Payer: Self-pay | Admitting: *Deleted

## 2013-03-26 MED ORDER — LISINOPRIL 20 MG PO TABS: ORAL_TABLET | ORAL | Status: DC

## 2013-04-10 ENCOUNTER — Other Ambulatory Visit: Payer: Self-pay | Admitting: Cardiology

## 2013-04-29 ENCOUNTER — Other Ambulatory Visit: Payer: Self-pay | Admitting: Cardiology

## 2013-05-18 ENCOUNTER — Other Ambulatory Visit: Payer: Self-pay | Admitting: Cardiology

## 2013-06-06 ENCOUNTER — Other Ambulatory Visit: Payer: Self-pay | Admitting: Cardiology

## 2013-06-18 ENCOUNTER — Other Ambulatory Visit: Payer: Self-pay | Admitting: Cardiology

## 2013-07-01 ENCOUNTER — Other Ambulatory Visit: Payer: Self-pay | Admitting: Cardiology

## 2013-07-07 ENCOUNTER — Other Ambulatory Visit: Payer: Self-pay | Admitting: Cardiology

## 2014-03-27 ENCOUNTER — Telehealth: Payer: Self-pay

## 2014-03-27 ENCOUNTER — Other Ambulatory Visit: Payer: Self-pay | Admitting: Cardiology

## 2014-04-14 ENCOUNTER — Other Ambulatory Visit: Payer: Self-pay | Admitting: Cardiology

## 2015-04-06 ENCOUNTER — Emergency Department (HOSPITAL_COMMUNITY): Payer: 59

## 2015-04-06 ENCOUNTER — Encounter (HOSPITAL_COMMUNITY): Payer: Self-pay | Admitting: Neurology

## 2015-04-06 ENCOUNTER — Inpatient Hospital Stay (HOSPITAL_COMMUNITY)
Admission: EM | Admit: 2015-04-06 | Discharge: 2015-04-15 | DRG: 223 | Disposition: A | Discharge status: Home / Self Care | Payer: 59 | Attending: Cardiology | Admitting: Cardiology

## 2015-04-06 DIAGNOSIS — I25118 Atherosclerotic heart disease of native coronary artery with other forms of angina pectoris: Secondary | ICD-10-CM | POA: Diagnosis not present

## 2015-04-06 DIAGNOSIS — I472 Ventricular tachycardia: Secondary | ICD-10-CM | POA: Diagnosis not present

## 2015-04-06 DIAGNOSIS — I10 Essential (primary) hypertension: Secondary | ICD-10-CM | POA: Diagnosis present

## 2015-04-06 DIAGNOSIS — I509 Heart failure, unspecified: Secondary | ICD-10-CM

## 2015-04-06 DIAGNOSIS — Z9119 Patient's noncompliance with other medical treatment and regimen: Secondary | ICD-10-CM | POA: Diagnosis present

## 2015-04-06 DIAGNOSIS — I5023 Acute on chronic systolic (congestive) heart failure: Principal | ICD-10-CM

## 2015-04-06 DIAGNOSIS — E876 Hypokalemia: Secondary | ICD-10-CM | POA: Diagnosis not present

## 2015-04-06 DIAGNOSIS — I214 Non-ST elevation (NSTEMI) myocardial infarction: Secondary | ICD-10-CM | POA: Diagnosis present

## 2015-04-06 DIAGNOSIS — Z8249 Family history of ischemic heart disease and other diseases of the circulatory system: Secondary | ICD-10-CM | POA: Diagnosis not present

## 2015-04-06 DIAGNOSIS — Z959 Presence of cardiac and vascular implant and graft, unspecified: Secondary | ICD-10-CM

## 2015-04-06 DIAGNOSIS — Z79899 Other long term (current) drug therapy: Secondary | ICD-10-CM | POA: Diagnosis not present

## 2015-04-06 DIAGNOSIS — I4891 Unspecified atrial fibrillation: Secondary | ICD-10-CM | POA: Diagnosis not present

## 2015-04-06 DIAGNOSIS — Z95810 Presence of automatic (implantable) cardiac defibrillator: Secondary | ICD-10-CM | POA: Diagnosis not present

## 2015-04-06 DIAGNOSIS — R0902 Hypoxemia: Secondary | ICD-10-CM | POA: Diagnosis present

## 2015-04-06 DIAGNOSIS — F17210 Nicotine dependence, cigarettes, uncomplicated: Secondary | ICD-10-CM | POA: Diagnosis present

## 2015-04-06 DIAGNOSIS — R0600 Dyspnea, unspecified: Secondary | ICD-10-CM

## 2015-04-06 DIAGNOSIS — I480 Paroxysmal atrial fibrillation: Secondary | ICD-10-CM | POA: Diagnosis not present

## 2015-04-06 DIAGNOSIS — F1010 Alcohol abuse, uncomplicated: Secondary | ICD-10-CM | POA: Diagnosis not present

## 2015-04-06 DIAGNOSIS — I429 Cardiomyopathy, unspecified: Secondary | ICD-10-CM | POA: Diagnosis not present

## 2015-04-06 DIAGNOSIS — G4733 Obstructive sleep apnea (adult) (pediatric): Secondary | ICD-10-CM | POA: Diagnosis present

## 2015-04-06 DIAGNOSIS — I361 Nonrheumatic tricuspid (valve) insufficiency: Secondary | ICD-10-CM | POA: Diagnosis not present

## 2015-04-06 DIAGNOSIS — R7989 Other specified abnormal findings of blood chemistry: Secondary | ICD-10-CM

## 2015-04-06 DIAGNOSIS — Z6838 Body mass index (BMI) 38.0-38.9, adult: Secondary | ICD-10-CM

## 2015-04-06 DIAGNOSIS — I4729 Other ventricular tachycardia: Secondary | ICD-10-CM

## 2015-04-06 DIAGNOSIS — I2510 Atherosclerotic heart disease of native coronary artery without angina pectoris: Secondary | ICD-10-CM | POA: Diagnosis not present

## 2015-04-06 DIAGNOSIS — I272 Other secondary pulmonary hypertension: Secondary | ICD-10-CM | POA: Diagnosis present

## 2015-04-06 DIAGNOSIS — R778 Other specified abnormalities of plasma proteins: Secondary | ICD-10-CM | POA: Diagnosis present

## 2015-04-06 DIAGNOSIS — Z7982 Long term (current) use of aspirin: Secondary | ICD-10-CM | POA: Diagnosis not present

## 2015-04-06 DIAGNOSIS — F1011 Alcohol abuse, in remission: Secondary | ICD-10-CM | POA: Diagnosis present

## 2015-04-06 DIAGNOSIS — I428 Other cardiomyopathies: Secondary | ICD-10-CM

## 2015-04-06 DIAGNOSIS — F17200 Nicotine dependence, unspecified, uncomplicated: Secondary | ICD-10-CM | POA: Diagnosis present

## 2015-04-06 DIAGNOSIS — E669 Obesity, unspecified: Secondary | ICD-10-CM | POA: Diagnosis present

## 2015-04-06 LAB — TSH: TSH: 1.365 u[IU]/mL (ref 0.350–4.500)

## 2015-04-06 LAB — PROTIME-INR
INR: 1.07 (ref 0.00–1.49)
Prothrombin Time: 14.1 seconds (ref 11.6–15.2)

## 2015-04-06 LAB — BRAIN NATRIURETIC PEPTIDE: B NATRIURETIC PEPTIDE 5: 546.6 pg/mL — AB (ref 0.0–100.0)

## 2015-04-06 LAB — TROPONIN I
Troponin I: 0.39 ng/mL — ABNORMAL HIGH (ref <0.031)
Troponin I: 0.39 ng/mL — ABNORMAL HIGH (ref <0.031)
Troponin I: 0.45 ng/mL — ABNORMAL HIGH (ref <0.031)

## 2015-04-06 LAB — BASIC METABOLIC PANEL
Anion gap: 8 (ref 5–15)
BUN: 10 mg/dL (ref 6–20)
CO2: 29 mmol/L (ref 22–32)
Calcium: 9 mg/dL (ref 8.9–10.3)
Chloride: 100 mmol/L — ABNORMAL LOW (ref 101–111)
Creatinine, Ser: 0.95 mg/dL (ref 0.61–1.24)
GFR calc Af Amer: >60 mL/min (ref >60)
GFR calc non Af Amer: >60 mL/min (ref >60)
GLUCOSE: 124 mg/dL — AB (ref 65–99)
Potassium: 4.4 mmol/L (ref 3.5–5.1)
Sodium: 137 mmol/L (ref 135–145)

## 2015-04-06 LAB — MRSA PCR SCREENING: MRSA by PCR: POSITIVE — AB

## 2015-04-06 LAB — CBC
HCT: 46.5 % (ref 39.0–52.0)
HEMOGLOBIN: 16.1 g/dL (ref 13.0–17.0)
MCH: 32.5 pg (ref 26.0–34.0)
MCHC: 34.6 g/dL (ref 30.0–36.0)
MCV: 93.9 fL (ref 78.0–100.0)
PLATELETS: 185 10*3/uL (ref 150–400)
RBC: 4.95 MIL/uL (ref 4.22–5.81)
RDW: 13.9 % (ref 11.5–15.5)
WBC: 7.2 10*3/uL (ref 4.0–10.5)

## 2015-04-06 LAB — I-STAT TROPONIN, ED: TROPONIN I, POC: 0.33 ng/mL — AB (ref 0.00–0.08)

## 2015-04-06 LAB — HEPARIN LEVEL (UNFRACTIONATED): Heparin Unfractionated: <0.1 IU/mL — ABNORMAL LOW (ref 0.30–0.70)

## 2015-04-06 LAB — CBG MONITORING, ED: GLUCOSE-CAPILLARY: 109 mg/dL — AB (ref 65–99)

## 2015-04-06 MED ORDER — ASPIRIN 300 MG RE SUPP: 300.0000 mg | RECTAL | Status: AC

## 2015-04-06 MED ORDER — SODIUM CHLORIDE 0.9 % IJ SOLN: 3.0000 mL | INTRAMUSCULAR | Status: DC | PRN

## 2015-04-06 MED ORDER — PAROXETINE HCL 20 MG PO TABS
20.0000 mg | ORAL_TABLET | ORAL | Status: DC
  Administered 2015-04-07 – 2015-04-15 (×9): 20 mg via ORAL
  Filled 2015-04-06 (×9): qty 1

## 2015-04-06 MED ORDER — ALPRAZOLAM 0.25 MG PO TABS: 0.2500 mg | ORAL_TABLET | Freq: Three times a day (TID) | ORAL | Status: DC | PRN

## 2015-04-06 MED ORDER — FUROSEMIDE 10 MG/ML IJ SOLN: 40.0000 mg | Freq: Two times a day (BID) | INTRAMUSCULAR | Status: DC

## 2015-04-06 MED ORDER — NITROGLYCERIN IN D5W 200-5 MCG/ML-% IV SOLN
5.0000 ug/min | INTRAVENOUS | Status: DC
  Administered 2015-04-06: 5 ug/min via INTRAVENOUS
  Filled 2015-04-06: qty 250

## 2015-04-06 MED ORDER — ASPIRIN EC 81 MG PO TBEC
81.0000 mg | DELAYED_RELEASE_TABLET | Freq: Every day | ORAL | Status: DC
  Administered 2015-04-08 – 2015-04-15 (×8): 81 mg via ORAL
  Filled 2015-04-06 (×8): qty 1

## 2015-04-06 MED ORDER — PANTOPRAZOLE SODIUM 40 MG PO TBEC
40.0000 mg | DELAYED_RELEASE_TABLET | Freq: Every day | ORAL | Status: DC
  Administered 2015-04-07 – 2015-04-15 (×9): 40 mg via ORAL
  Filled 2015-04-06 (×9): qty 1

## 2015-04-06 MED ORDER — POTASSIUM CHLORIDE CRYS ER 20 MEQ PO TBCR: 20.0000 meq | EXTENDED_RELEASE_TABLET | Freq: Every day | ORAL | Status: DC

## 2015-04-06 MED ORDER — NITROGLYCERIN 0.3 MG SL SUBL
0.3000 mg | SUBLINGUAL_TABLET | SUBLINGUAL | Status: DC | PRN
  Filled 2015-04-06: qty 100

## 2015-04-06 MED ORDER — SODIUM CHLORIDE 0.9 % IJ SOLN
3.0000 mL | Freq: Two times a day (BID) | INTRAMUSCULAR | Status: DC
  Administered 2015-04-06 – 2015-04-15 (×13): 3 mL via INTRAVENOUS

## 2015-04-06 MED ORDER — ACETAMINOPHEN 325 MG PO TABS
650.0000 mg | ORAL_TABLET | ORAL | Status: DC | PRN
  Administered 2015-04-07: 650 mg via ORAL
  Filled 2015-04-06: qty 2

## 2015-04-06 MED ORDER — LEVALBUTEROL HCL 0.63 MG/3ML IN NEBU
0.6300 mg | INHALATION_SOLUTION | Freq: Four times a day (QID) | RESPIRATORY_TRACT | Status: DC | PRN
Start: 2015-04-06 — End: 2015-04-15
  Filled 2015-04-06: qty 3

## 2015-04-06 MED ORDER — ASPIRIN 81 MG PO CHEW: 324.0000 mg | CHEWABLE_TABLET | ORAL | Status: AC

## 2015-04-06 MED ORDER — ZOLPIDEM TARTRATE 5 MG PO TABS
5.0000 mg | ORAL_TABLET | Freq: Every evening | ORAL | Status: DC | PRN
  Administered 2015-04-06 – 2015-04-14 (×4): 5 mg via ORAL
  Filled 2015-04-06 (×5): qty 1

## 2015-04-06 MED ORDER — CARVEDILOL 12.5 MG PO TABS
12.5000 mg | ORAL_TABLET | Freq: Two times a day (BID) | ORAL | Status: DC
  Administered 2015-04-06 – 2015-04-15 (×16): 12.5 mg via ORAL
  Filled 2015-04-06 (×18): qty 1

## 2015-04-06 MED ORDER — ONDANSETRON HCL 4 MG/2ML IJ SOLN
4.0000 mg | Freq: Four times a day (QID) | INTRAMUSCULAR | Status: DC | PRN
  Administered 2015-04-06: 4 mg via INTRAVENOUS
  Filled 2015-04-06: qty 2

## 2015-04-06 MED ORDER — LEVALBUTEROL HCL 0.63 MG/3ML IN NEBU
0.6300 mg | INHALATION_SOLUTION | Freq: Once | RESPIRATORY_TRACT | Status: AC
  Administered 2015-04-06: 0.63 mg via RESPIRATORY_TRACT
  Filled 2015-04-06: qty 3

## 2015-04-06 MED ORDER — FUROSEMIDE 10 MG/ML IJ SOLN
40.0000 mg | Freq: Once | INTRAMUSCULAR | Status: AC
  Administered 2015-04-06: 40 mg via INTRAVENOUS
  Filled 2015-04-06: qty 4

## 2015-04-06 MED ORDER — HEPARIN BOLUS VIA INFUSION
4000.0000 [IU] | Freq: Once | INTRAVENOUS | Status: AC
  Administered 2015-04-06: 4000 [IU] via INTRAVENOUS
  Filled 2015-04-06: qty 4000

## 2015-04-06 MED ORDER — LEVALBUTEROL HCL 0.63 MG/3ML IN NEBU: 0.6300 mg | INHALATION_SOLUTION | Freq: Four times a day (QID) | RESPIRATORY_TRACT | Status: DC | PRN

## 2015-04-06 MED ORDER — LISINOPRIL 20 MG PO TABS
20.0000 mg | ORAL_TABLET | Freq: Every day | ORAL | Status: DC
  Administered 2015-04-08: 20 mg via ORAL
  Filled 2015-04-06: qty 1

## 2015-04-06 MED ORDER — ATORVASTATIN CALCIUM 80 MG PO TABS
80.0000 mg | ORAL_TABLET | Freq: Every day | ORAL | Status: DC
  Administered 2015-04-06 – 2015-04-14 (×9): 80 mg via ORAL
  Filled 2015-04-06 (×8): qty 1

## 2015-04-06 MED ORDER — HEPARIN BOLUS VIA INFUSION
3500.0000 [IU] | Freq: Once | INTRAVENOUS | Status: AC
Start: 2015-04-06 — End: 2015-04-06
  Administered 2015-04-06: 3500 [IU] via INTRAVENOUS
  Filled 2015-04-06: qty 3500

## 2015-04-06 MED ORDER — HEPARIN (PORCINE) IN NACL 100-0.45 UNIT/ML-% IJ SOLN
3000.0000 [IU]/h | INTRAMUSCULAR | Status: DC
  Administered 2015-04-06: 1600 [IU]/h via INTRAVENOUS
  Administered 2015-04-06: 1900 [IU]/h via INTRAVENOUS
  Administered 2015-04-07: 2400 [IU]/h via INTRAVENOUS
  Administered 2015-04-07: 2750 [IU]/h via INTRAVENOUS
  Administered 2015-04-08: 3000 [IU]/h via INTRAVENOUS
  Filled 2015-04-06 (×7): qty 250

## 2015-04-06 MED ORDER — SODIUM CHLORIDE 0.9 % IV SOLN: 250.0000 mL | INTRAVENOUS | Status: DC | PRN

## 2015-04-06 MED ORDER — ASPIRIN 325 MG PO TABS
325.0000 mg | ORAL_TABLET | ORAL | Status: AC
  Administered 2015-04-06: 325 mg via ORAL
  Filled 2015-04-06: qty 1

## 2015-04-06 NOTE — ED Notes (Signed)
Pt reports sob since this morning, feels like he can't catch his breath. Denies cp. Reports hx of CHF, no swelling noted. In NAD, is a x 4.

## 2015-04-06 NOTE — Progress Notes (Signed)
MRSA swab came back positive from lab. Placed on contact precautions

## 2015-04-06 NOTE — H&P (Signed)
Patient ID: [name] [name] MRN: [id number], DOB/AGE: [birth year]   Admit date: 04/06/2015   Primary Physician: [name], MD Primary Cardiologist: Dr Antoine Poche  HPI: 54 y/o Caucasian male, single, works in heating and air, followed by Dr Antoine Poche for NICM. His last OV was 2013. Dr Jeannetta Nap is his PCP. His EF in 2012 was 25%. He had minor CAD in 2009. He was morbidly obese but says he has lost almost 100 lbs over the past few years. He smokes 1/2 pk a day and drinks 6-8 beers a day. He has run out of his Lasix in the past, but refills when he gets SOB. He went back on Lasix two weeks ago for this reason but for the past few days he has had increasing DOE. He denies orthopnea or LE edema. Yesterday he became quite SOB after a shower. Today he didn't feel like he could go to work and came to the ED.  In the ED he is SOB at rest (despite Lasix 40 mg). He also complains of SSCP "tightness" and has an elevated Troponin and (?) new LBBB on EKG.    Problem List: Past Medical History  Diagnosis Date  . HTN (hypertension)   . OSA on CPAP   . CAD (coronary artery disease)     Mild nonobstructive (Cath 09)  . Ventricular tachycardia   . Dyspnea   . Obesity, morbid   . Chronic systolic heart failure     Nonischemic CM: echo 4/12 with EF 25-30%, grade 2 diast dysfxn, mild dilated aortic root 43 mm, trivial MR, mod LAE  . Hypersomnia   . Tobacco use disorder   . H/O ETOH abuse   . CHF (congestive heart failure)     Past Surgical History  Procedure Laterality Date  . Hydrocele excision / repair       Allergies: No Known Allergies   Home Medications Current Facility-Administered Medications  Medication Dose Route Frequency Provider Last Rate Last Dose  . heparin ADULT infusion 100 units/mL (25000 units/250 mL)  1,600 Units/hr Intravenous Continuous Gerilyn Nestle, RPH 16 mL/hr at 04/06/15 1501 1,600 Units/hr at 04/06/15 1501  . levalbuterol (XOPENEX) nebulizer solution 0.63 mg  0.63 mg  Nebulization Q6H PRN Purvis Sheffield, MD      . nitroGLYCERIN (NITROSTAT) SL tablet 0.3 mg  0.3 mg Sublingual Q5 min PRN Purvis Sheffield, MD       Current Outpatient Prescriptions  Medication Sig Dispense Refill  . aspirin 81 MG tablet Take 81 mg by mouth daily. occasionally    . carvedilol (COREG) 25 MG tablet TAKE ONE & ONE-HALF TABLETS BY MOUTH TWICE DAILY 180 tablet 3  . carvedilol (COREG) 25 MG tablet TAKE 1 AND 1/2 TABLETS TWICE A DAY 180 tablet 3  . furosemide (LASIX) 40 MG tablet TAKE ONE TABLET BY MOUTH ONCE DAILY 7 tablet 0  . KLOR-CON M20 20 MEQ tablet TAKE ONE TABLET BY MOUTH ONCE DAILY 15 tablet 0  . lisinopril (PRINIVIL,ZESTRIL) 20 MG tablet TAKE ONE TABLET BY MOUTH TWICE DAILY 60 tablet 0  . PARoxetine (PAXIL) 20 MG tablet Take 20 mg by mouth every morning.       Family History  Problem Relation Age of Onset  . Coronary artery disease       History   Social History  . Marital Status: Single    Spouse Name: N/A  . Number of Children: 2  . Years of Education: N/A   Occupational History  . scrap  metal    Social History Main Topics  . Smoking status: Current Some Day Smoker  . Smokeless tobacco: Not on file  . Alcohol Use: 3.6 oz/week    6 Cans of beer per week  . Drug Use: Yes    Special: Codeine, Marijuana     Comment: has discontinued  . Sexual Activity: Not on file   Other Topics Concern  . Not on file   Social History Narrative     Review of Systems: General: negative for chills, fever, night sweats or weight changes.  Cardiovascular: negative for edema, orthopnea, palpitations, paroxysmal nocturnal dyspnea  Dermatological: negative for rash Respiratory: negative for cough or wheezing Urologic: negative for hematuria Abdominal: negative for nausea, vomiting, diarrhea, bright red blood per rectum, melena, or hematemesis Neurologic: negative for visual changes, syncope, or dizziness Hx of sleep apnea All other systems reviewed and are otherwise  negative except as noted above.  Physical Exam: Blood pressure 125/91, pulse 100, temperature 97.5 F (36.4 C), temperature source Oral, resp. rate 22, height 6\' 4"  (1.93 m), weight 289 lb 7 oz (131.288 kg), SpO2 91 %.  General appearance: alert, cooperative, mild distress and moderately obese Neck: no carotid bruit and no JVD Lungs: decreased breath sounds Heart: regular rate and rhythm Abdomen: soft, non-tender; bowel sounds normal; no masses,  no organomegaly Extremities: extremities normal, atraumatic, no cyanosis or edema Pulses: 2+ and symmetric Skin: diffuse sweating, warm Neurologic: Grossly normal    Labs:   Results for orders placed or performed during the hospital encounter of 04/06/15 (from the past 24 hour(s))  Basic metabolic panel     Status: Abnormal   Collection Time: 04/06/15 12:05 PM  Result Value Ref Range   Sodium 137 135 - 145 mmol/L   Potassium 4.4 3.5 - 5.1 mmol/L   Chloride 100 (L) 101 - 111 mmol/L   CO2 29 22 - 32 mmol/L   Glucose, Bld 124 (H) 65 - 99 mg/dL   BUN 10 6 - 20 mg/dL   Creatinine, Ser 5.85 0.61 - 1.24 mg/dL   Calcium 9.0 8.9 - 27.7 mg/dL   GFR calc non Af Amer >60 >60 mL/min   GFR calc Af Amer >60 >60 mL/min   Anion gap 8 5 - 15  CBC     Status: None   Collection Time: 04/06/15 12:05 PM  Result Value Ref Range   WBC 7.2 4.0 - 10.5 K/uL   RBC 4.95 4.22 - 5.81 MIL/uL   Hemoglobin 16.1 13.0 - 17.0 g/dL   HCT 82.4 23.5 - 36.1 %   MCV 93.9 78.0 - 100.0 fL   MCH 32.5 26.0 - 34.0 pg   MCHC 34.6 30.0 - 36.0 g/dL   RDW 44.3 15.4 - 00.8 %   Platelets 185 150 - 400 K/uL  Brain natriuretic peptide     Status: Abnormal   Collection Time: 04/06/15 12:05 PM  Result Value Ref Range   B Natriuretic Peptide 546.6 (H) 0.0 - 100.0 pg/mL  I-stat troponin, ED     Status: Abnormal   Collection Time: 04/06/15 12:17 PM  Result Value Ref Range   Troponin i, poc 0.33 (HH) 0.00 - 0.08 ng/mL   Comment NOTIFIED PHYSICIAN    Comment 3               Radiology/Studies: Dg Chest 2 View  04/06/2015   CLINICAL DATA:  Shortness of breath since this morning.  EXAM: CHEST  2 VIEW  COMPARISON:  06/05/2011  FINDINGS: The patient has extensive bilateral interstitial pulmonary edema. Tiny right effusion. New cardiomegaly with pulmonary vascular congestion. No acute osseous abnormality. Chronic accentuation of the thoracic kyphosis.  IMPRESSION: Prominent interstitial pulmonary edema. Tiny right effusion. Findings consistent with congestive heart failure.   Electronically Signed   By: Francene Boyers M.D.   On: 04/06/2015 12:53    EKG:NSR, LBBB (new c/w 2009) he did have IVCD on office KG 2013  ASSESSMENT AND PLAN:  Active Problems:   Acute on chronic systolic heart failure   NICM- EF 25% 2012   Troponin level elevated   Essential hypertension   CAD- minor CAD 2009   Chronic systolic heart failure   Obesity   Tobacco use disorder- 1/2 pk a day   H/O ETOH abuse 6-8 beers a day   PLAN: Repeat IV Lasix, start Heparin, NTG, admit for further diuresis. R/O MI. He'll need Lt and Rt heart cath when stable. Will decrease Coreg to 12.5 mg BID with CHF   Deland Pretty, PA-C 04/06/2015, 3:35 PM [phone]  Patient examined chart reviewed. Somewhat stoic male with compliance issues.  Known DCM with EF 25%  Admitted with advanced CHF.  ? Related to ETOH or idiopathic in past Exam with diaphoresis.  Bilateral rales 1/2 way up no obvious murmur mild LE edema.  F/U echo to see where EF is Admit to unit.  Agree that right and left heart cath will likely be needed once he is improved.    Charlton Haws

## 2015-04-06 NOTE — ED Notes (Signed)
Pt increasingly diaphoretic.

## 2015-04-06 NOTE — ED Notes (Signed)
Phlebotomy at the bedside  

## 2015-04-06 NOTE — ED Notes (Signed)
Cardiology at the bedside.

## 2015-04-06 NOTE — ED Provider Notes (Signed)
CSN: [id number]     Arrival date & time 04/06/15  1146 History   First MD Initiated Contact with Patient 04/06/15 1313     Chief Complaint  Patient presents with  . Shortness of Breath     (Consider location/radiation/quality/duration/timing/severity/associated sxs/prior Treatment) Patient is a 54 y.o. male presenting with shortness of breath. The history is provided by the patient.  Shortness of Breath Severity:  Mild Onset quality:  Gradual Timing:  Constant Progression:  Unchanged Chronicity:  New Context comment:  At rest Relieved by:  Nothing Worsened by:  Nothing tried Ineffective treatments:  None tried Associated symptoms: diaphoresis   Associated symptoms: no abdominal pain, no chest pain, no cough, no fever, no headaches, no neck pain and no vomiting     Past Medical History  Diagnosis Date  . HTN (hypertension)   . OSA on CPAP   . CAD (coronary artery disease)     Mild nonobstructive (Cath 09)  . Ventricular tachycardia   . Dyspnea   . Obesity, morbid   . Chronic systolic heart failure     Nonischemic CM: echo 4/12 with EF 25-30%, grade 2 diast dysfxn, mild dilated aortic root 43 mm, trivial MR, mod LAE  . Hypersomnia   . Tobacco use disorder   . H/O ETOH abuse   . CHF (congestive heart failure)    Past Surgical History  Procedure Laterality Date  . Hydrocele excision / repair     Family History  Problem Relation Age of Onset  . Coronary artery disease     History  Substance Use Topics  . Smoking status: Current Some Day Smoker  . Smokeless tobacco: Not on file  . Alcohol Use: 3.6 oz/week    6 Cans of beer per week    Review of Systems  Constitutional: Positive for diaphoresis. Negative for fever.  HENT: Negative for drooling and rhinorrhea.   Eyes: Negative for pain.  Respiratory: Positive for chest tightness and shortness of breath. Negative for cough.   Cardiovascular: Negative for chest pain and leg swelling.  Gastrointestinal: Negative  for nausea, vomiting, abdominal pain and diarrhea.  Genitourinary: Negative for dysuria and hematuria.  Musculoskeletal: Negative for gait problem and neck pain.  Skin: Negative for color change.  Neurological: Negative for numbness and headaches.  Hematological: Negative for adenopathy.  Psychiatric/Behavioral: Negative for behavioral problems.  All other systems reviewed and are negative.     Allergies  Review of patient's allergies indicates no known allergies.  Home Medications   Prior to Admission medications   Medication Sig Start Date End Date Taking? Authorizing Provider  aspirin 81 MG tablet Take 81 mg by mouth daily. occasionally    Historical Provider, MD  carvedilol (COREG) 25 MG tablet TAKE ONE & ONE-HALF TABLETS BY MOUTH TWICE DAILY 10/25/12   [name], MD  carvedilol (COREG) 25 MG tablet TAKE 1 AND 1/2 TABLETS TWICE A DAY 11/04/12   [name], MD  furosemide (LASIX) 40 MG tablet TAKE ONE TABLET BY MOUTH ONCE DAILY    [name], MD  KLOR-CON M20 20 MEQ tablet TAKE ONE TABLET BY MOUTH ONCE DAILY 07/01/13   [name], MD  lisinopril (PRINIVIL,ZESTRIL) 20 MG tablet TAKE ONE TABLET BY MOUTH TWICE DAILY 05/18/13   [name], MD  PARoxetine (PAXIL) 20 MG tablet Take 20 mg by mouth every morning.    Historical Provider, MD   BP 116/71 mmHg  Pulse 98  Temp(Src) 97.5 F (36.4 C) (Oral)  Resp 32  Ht  (1.93 m)  Wt 289 lb 7 oz (131.288 kg)  BMI 35.25 kg/m2  SpO2 94% Physical Exam  Constitutional: He is oriented to person, place, and time. He appears well-developed and well-nourished.  HENT:  Head: Normocephalic and atraumatic.  Right Ear: External ear normal.  Left Ear: External ear normal.  Nose: Nose normal.  Mouth/Throat: Oropharynx is clear and moist. No oropharyngeal exudate.  Eyes: Conjunctivae and EOM are normal. Pupils are equal, round, and reactive to light.  Neck: Normal range of motion. Neck supple.  Cardiovascular: Normal rate,  regular rhythm, normal heart sounds and intact distal pulses.  Exam reveals no gallop and no friction rub.   No murmur heard. Pulmonary/Chest: Effort normal and breath sounds normal. No respiratory distress. He has no wheezes.  Abdominal: Soft. Bowel sounds are normal. He exhibits no distension. There is no tenderness. There is no rebound and no guarding.  Musculoskeletal: Normal range of motion. He exhibits no edema or tenderness.  Neurological: He is alert and oriented to person, place, and time.  Skin: Skin is warm. He is diaphoretic (mild).  Psychiatric: He has a normal mood and affect. His behavior is normal.  Nursing note and vitals reviewed.   ED Course  Procedures (including critical care time) Labs Review Labs Reviewed  BASIC METABOLIC PANEL - Abnormal; Notable for the following:    Chloride 100 (*)    Glucose, Bld 124 (*)    All other components within normal limits  BRAIN NATRIURETIC PEPTIDE - Abnormal; Notable for the following:    B Natriuretic Peptide 546.6 (*)    All other components within normal limits  I-STAT TROPOININ, ED - Abnormal; Notable for the following:    Troponin i, poc 0.33 (*)    All other components within normal limits  CBC  TROPONIN I    Imaging Review Dg Chest 2 View  04/06/2015   CLINICAL DATA:  Shortness of breath since this morning.  EXAM: CHEST  2 VIEW  COMPARISON:  06/05/2011  FINDINGS: The patient has extensive bilateral interstitial pulmonary edema. Tiny right effusion. New cardiomegaly with pulmonary vascular congestion. No acute osseous abnormality. Chronic accentuation of the thoracic kyphosis.  IMPRESSION: Prominent interstitial pulmonary edema. Tiny right effusion. Findings consistent with congestive heart failure.   Electronically Signed   By: Francene Boyers M.D.   On: 04/06/2015 12:53     EKG Interpretation   Date/Time:  Tuesday April 06 2015 11:54:48 EDT Ventricular Rate:  101 PR Interval:  188 QRS Duration: 132 QT Interval:   370 QTC Calculation: 479 R Axis:   -48 Text Interpretation:  Sinus tachycardia Possible Left atrial enlargement  Left axis deviation Left bundle branch block Otherwise no significant  change Confirmed by Godric Lavell  MD, Jenayah Antu ([id number]) on 04/06/2015 2:31:43 PM     CRITICAL CARE Performed by: Purvis Sheffield, S Total critical care time: 30 min Critical care time was exclusive of separately billable procedures and treating other patients. Critical care was necessary to treat or prevent imminent or life-threatening deterioration. Critical care was time spent personally by me on the following activities: development of treatment plan with patient and/or surrogate as well as nursing, discussions with consultants, evaluation of patient's response to treatment, examination of patient, obtaining history from patient or surrogate, ordering and performing treatments and interventions, ordering and review of laboratory studies, ordering and review of radiographic studies, pulse oximetry and re-evaluation of patient's condition.  MDM   Final diagnoses:  CHF exacerbation  NSTEMI (non-ST  elevated myocardial infarction)    2:28 PM 54 y.o. male w hx of HTN, CAD (Cath '09, mild non-obst), CHF who presents with shortness of breath and chest tightness since he awoke this morning. He has that he had some shortness of breath upon awakening yesterday morning but seemed to resolve. His symptoms have persisted throughout today and he has also had some diaphoresis. He is mildly hypoxic on exam, VS otherwise stable. He notes that he has been compliant with his Lasix. He was found to have a mildly elevated troponin. Chest x-ray consistent with heart failure. [facility] consult cardiology for admission. Will heparinize.   2:54 PM Cards consult placed.   Purvis Sheffield, MD 04/07/15 [phone]

## 2015-04-06 NOTE — Progress Notes (Signed)
Paged Cards Fellow for CPAP. Pt uses at home. CPAP ordered promptly. Respiratory notified

## 2015-04-06 NOTE — Progress Notes (Signed)
ANTICOAGULATION CONSULT NOTE  Pharmacy Consult:  Heparin Indication:  NSTEMI  No Known Allergies  Patient Measurements: Height: 6\' 4"  (193 cm) Weight: 284 lb 12.8 oz (129.184 kg) IBW/kg (Calculated) : 86.8 Heparin Dosing Weight: 115 kg  Vital Signs: Temp: 98.7 F (37.1 C) (07/19 2034) Temp Source: Oral (07/19 2034) BP: 132/87 mmHg (07/19 1831) Pulse Rate: 96 (07/19 1831)  Labs:  Recent Labs  04/06/15 1205 04/06/15 1455 04/06/15 1614 04/06/15 2129  HGB 16.1  --   --   --   HCT 46.5  --   --   --   PLT 185  --   --   --   LABPROT  --   --  14.1  --   INR  --   --  1.07  --   HEPARINUNFRC  --   --   --  <0.10*  CREATININE 0.95  --   --   --   TROPONINI  --  0.39* 0.39*  --     Estimated Creatinine Clearance: 133.5 mL/min (by C-G formula based on Cr of 0.95).   Medical History: Past Medical History  Diagnosis Date  . HTN (hypertension)   . OSA on CPAP   . CAD (coronary artery disease)     Mild nonobstructive (Cath 09)  . Ventricular tachycardia   . Dyspnea   . Obesity, morbid   . Chronic systolic heart failure     Nonischemic CM: echo 4/12 with EF 25-30%, grade 2 diast dysfxn, mild dilated aortic root 43 mm, trivial MR, mod LAE  . Hypersomnia   . Tobacco use disorder   . H/O ETOH abuse   . CHF (congestive heart failure)        Assessment: 54 YOM continuing on IV heparin for NSTEMI. CBC wnl, no bleeding or IV line issues per RN. Will likely need cath once stable per MD note.  1st HL undetectable after 4000u bolus + heparin IV @1600  units/h   Goal of Therapy:  Heparin level 0.3-0.7 units/ml Monitor platelets by anticoagulation protocol: Yes    Plan:  - Heparin 3500 unit re-bolus - Increase Heparin gtt to 1900 units/hr - 6 hr HL - Daily HL / CBC - Mon s/sx bleeding   Babs Bertin, PharmD Clinical Pharmacist Pager [phone] 04/06/2015 10:17 PM

## 2015-04-06 NOTE — ED Notes (Signed)
Per Rulon Eisenmenger in phlebotomy, istat trop was 0.33.  Reported results to Dr. Romeo Apple at 1229.

## 2015-04-06 NOTE — Progress Notes (Signed)
ANTICOAGULATION CONSULT NOTE - Initial Consult  Pharmacy Consult:  Heparin Indication:  NSTEMI  No Known Allergies  Patient Measurements: Height: 6\' 4"  (193 cm) Weight: 289 lb 7 oz (131.288 kg) IBW/kg (Calculated) : 86.8 Heparin Dosing Weight: 115 kg  Vital Signs: Temp: 97.5 F (36.4 C) (07/19 1204) Temp Source: Oral (07/19 1204) BP: 116/71 mmHg (07/19 1345) Pulse Rate: 98 (07/19 1345)  Labs:  Recent Labs  04/06/15 1205  HGB 16.1  HCT 46.5  PLT 185  CREATININE 0.95    Estimated Creatinine Clearance: 134.6 mL/min (by C-G formula based on Cr of 0.95).   Medical History: Past Medical History  Diagnosis Date  . HTN (hypertension)   . OSA on CPAP   . CAD (coronary artery disease)     Mild nonobstructive (Cath 09)  . Ventricular tachycardia   . Dyspnea   . Obesity, morbid   . Chronic systolic heart failure     Nonischemic CM: echo 4/12 with EF 25-30%, grade 2 diast dysfxn, mild dilated aortic root 43 mm, trivial MR, mod LAE  . Hypersomnia   . Tobacco use disorder   . H/O ETOH abuse   . CHF (congestive heart failure)        Assessment: 13 YOM with history of CHF, CAD, and HTN presented with SOB.  Pharmacy consulted to initiate IV heparin for NSTEMI.  Baseline labs reviewed.   Goal of Therapy:  Heparin level 0.3-0.7 units/ml Monitor platelets by anticoagulation protocol: Yes    Plan:  - Heparin 4000 units IV bolus x 1, then - Heparin gtt at 1600 units/hr - Check 6 hr HL - Daily HL / CBC    Nathaniel Wakeley D. Laney Potash, PharmD, BCPS Pager:  [phone] 04/06/2015, 2:30 PM

## 2015-04-06 NOTE — Progress Notes (Signed)
Placed patient on CPAP auto 8-20cm H20 with 6L 02 bleed in via full face mask. Patient is tolerating well at this time.

## 2015-04-06 NOTE — ED Notes (Signed)
Attempted Report x1.   

## 2015-04-07 ENCOUNTER — Inpatient Hospital Stay (HOSPITAL_COMMUNITY): Payer: 59

## 2015-04-07 ENCOUNTER — Ambulatory Visit (HOSPITAL_COMMUNITY): Payer: 59

## 2015-04-07 DIAGNOSIS — I25118 Atherosclerotic heart disease of native coronary artery with other forms of angina pectoris: Secondary | ICD-10-CM

## 2015-04-07 DIAGNOSIS — Z720 Tobacco use: Secondary | ICD-10-CM

## 2015-04-07 DIAGNOSIS — E669 Obesity, unspecified: Secondary | ICD-10-CM

## 2015-04-07 DIAGNOSIS — R7989 Other specified abnormal findings of blood chemistry: Secondary | ICD-10-CM

## 2015-04-07 DIAGNOSIS — F1010 Alcohol abuse, uncomplicated: Secondary | ICD-10-CM

## 2015-04-07 LAB — COMPREHENSIVE METABOLIC PANEL
ALT: 20 U/L (ref 17–63)
AST: 19 U/L (ref 15–41)
Albumin: 3.6 g/dL (ref 3.5–5.0)
Alkaline Phosphatase: 75 U/L (ref 38–126)
Anion gap: 10 (ref 5–15)
BUN: 14 mg/dL (ref 6–20)
CO2: 26 mmol/L (ref 22–32)
Calcium: 8.6 mg/dL — ABNORMAL LOW (ref 8.9–10.3)
Chloride: 99 mmol/L — ABNORMAL LOW (ref 101–111)
Creatinine, Ser: 0.85 mg/dL (ref 0.61–1.24)
GFR calc Af Amer: >60 mL/min (ref >60)
GFR calc non Af Amer: >60 mL/min (ref >60)
Glucose, Bld: 140 mg/dL — ABNORMAL HIGH (ref 65–99)
Potassium: 3.6 mmol/L (ref 3.5–5.1)
Sodium: 135 mmol/L (ref 135–145)
Total Bilirubin: 2.2 mg/dL — ABNORMAL HIGH (ref 0.3–1.2)
Total Protein: 6 g/dL — ABNORMAL LOW (ref 6.5–8.1)

## 2015-04-07 LAB — BLOOD GAS, ARTERIAL
ACID-BASE EXCESS: 2.6 mmol/L — AB (ref 0.0–2.0)
Bicarbonate: 26.7 mEq/L — ABNORMAL HIGH (ref 20.0–24.0)
Delivery systems: POSITIVE
Drawn by: 405301
EXPIRATORY PAP: 6
FIO2: 0.5 %
INSPIRATORY PAP: 12
O2 SAT: 96.1 %
Patient temperature: 97.6
RATE: 8 resp/min
TCO2: 28 mmol/L (ref 0–100)
pCO2 arterial: 41.3 mmHg (ref 35.0–45.0)
pH, Arterial: 7.424 (ref 7.350–7.450)
pO2, Arterial: 85.1 mmHg (ref 80.0–100.0)

## 2015-04-07 LAB — HEPARIN LEVEL (UNFRACTIONATED)
Heparin Unfractionated: 0.11 IU/mL — ABNORMAL LOW (ref 0.30–0.70)
Heparin Unfractionated: 0.15 IU/mL — ABNORMAL LOW (ref 0.30–0.70)
Heparin Unfractionated: 0.2 IU/mL — ABNORMAL LOW (ref 0.30–0.70)

## 2015-04-07 LAB — TROPONIN I: Troponin I: 0.37 ng/mL — ABNORMAL HIGH (ref <0.031)

## 2015-04-07 LAB — CBC
HCT: 43 % (ref 39.0–52.0)
HEMOGLOBIN: 14.8 g/dL (ref 13.0–17.0)
MCH: 32.5 pg (ref 26.0–34.0)
MCHC: 34.4 g/dL (ref 30.0–36.0)
MCV: 94.3 fL (ref 78.0–100.0)
Platelets: 168 10*3/uL (ref 150–400)
RBC: 4.56 MIL/uL (ref 4.22–5.81)
RDW: 13.9 % (ref 11.5–15.5)
WBC: 8.2 10*3/uL (ref 4.0–10.5)

## 2015-04-07 LAB — HEMOGLOBIN A1C
Hgb A1c MFr Bld: 5.3 % (ref 4.8–5.6)
Mean Plasma Glucose: 105 mg/dL

## 2015-04-07 MED ORDER — HEPARIN BOLUS VIA INFUSION
3000.0000 [IU] | Freq: Once | INTRAVENOUS | Status: AC
  Administered 2015-04-07: 3000 [IU] via INTRAVENOUS
  Filled 2015-04-07: qty 3000

## 2015-04-07 MED ORDER — SODIUM CHLORIDE 0.9 % IJ SOLN
3.0000 mL | Freq: Two times a day (BID) | INTRAMUSCULAR | Status: DC
Start: 2015-04-07 — End: 2015-04-08

## 2015-04-07 MED ORDER — HEPARIN BOLUS VIA INFUSION
2000.0000 [IU] | Freq: Once | INTRAVENOUS | Status: AC
  Administered 2015-04-07: 2000 [IU] via INTRAVENOUS
  Filled 2015-04-07: qty 2000

## 2015-04-07 MED ORDER — FUROSEMIDE 10 MG/ML IJ SOLN
40.0000 mg | INTRAMUSCULAR | Status: AC
  Administered 2015-04-07 (×2): 40 mg via INTRAVENOUS
  Filled 2015-04-07 (×2): qty 4

## 2015-04-07 MED ORDER — SODIUM CHLORIDE 0.9 % IV SOLN
INTRAVENOUS | Status: DC
Start: 2015-04-08 — End: 2015-04-08
  Administered 2015-04-08: 07:00:00 via INTRAVENOUS

## 2015-04-07 MED ORDER — SODIUM CHLORIDE 0.9 % IJ SOLN: 3.0000 mL | INTRAMUSCULAR | Status: DC | PRN

## 2015-04-07 MED ORDER — SODIUM CHLORIDE 0.9 % IV SOLN: 250.0000 mL | INTRAVENOUS | Status: DC | PRN

## 2015-04-07 MED ORDER — POTASSIUM CHLORIDE CRYS ER 20 MEQ PO TBCR
20.0000 meq | EXTENDED_RELEASE_TABLET | Freq: Once | ORAL | Status: AC
  Administered 2015-04-07: 20 meq via ORAL
  Filled 2015-04-07: qty 1

## 2015-04-07 MED ORDER — METOPROLOL TARTRATE 12.5 MG HALF TABLET
12.5000 mg | ORAL_TABLET | Freq: Once | ORAL | Status: AC
  Administered 2015-04-07: 12.5 mg via ORAL
  Filled 2015-04-07: qty 1

## 2015-04-07 MED ORDER — FUROSEMIDE 10 MG/ML IJ SOLN
40.0000 mg | Freq: Three times a day (TID) | INTRAMUSCULAR | Status: DC
  Administered 2015-04-07 – 2015-04-08 (×3): 40 mg via INTRAVENOUS
  Filled 2015-04-07 (×3): qty 4

## 2015-04-07 NOTE — Plan of Care (Signed)
Patient is hypoxic with bilateral crackles. Currently on ventimask. Admitted with heart failure. Weight is up.  Will give 40 mg IV lasix x 2 Q4 hours, place on bipap 12/6 40% FiO2 setting. Check ABG and obtain CXR portable.

## 2015-04-07 NOTE — Progress Notes (Signed)
ANTICOAGULATION CONSULT NOTE - Follow Up Consult  Pharmacy Consult for heparin Indication: NSTEMI  No Known Allergies  Patient Measurements: Height: 6\' 4"  (193 cm) Weight: 284 lb (128.822 kg) IBW/kg (Calculated) : 86.8 Heparin Dosing Weight: 115kg  Vital Signs: Temp: 98.8 F (37.1 C) (07/20 0500) Temp Source: Oral (07/20 0500) BP: 121/74 mmHg (07/20 0757) Pulse Rate: 95 (07/20 0810)  Labs:  Recent Labs  04/06/15 1205  04/06/15 1614 04/06/15 2129 04/07/15 0402 04/07/15 1048  HGB 16.1  --   --   --  14.8  --   HCT 46.5  --   --   --  43.0  --   PLT 185  --   --   --  168  --   LABPROT  --   --  14.1  --   --   --   INR  --   --  1.07  --   --   --   HEPARINUNFRC  --   --   --  <0.10* 0.11* 0.15*  CREATININE 0.95  --   --   --  0.85  --   TROPONINI  --   < > 0.39* 0.45* 0.37*  --   < > = values in this interval not displayed.  Estimated Creatinine Clearance: 149 mL/min (by C-G formula based on Cr of 0.85).   Medications:  Scheduled:  . aspirin  324 mg Oral NOW   Or  . aspirin  300 mg Rectal NOW  . aspirin EC  81 mg Oral Daily  . atorvastatin  80 mg Oral q1800  . carvedilol  12.5 mg Oral BID WC  . furosemide  40 mg Intravenous TID  . lisinopril  20 mg Oral Daily  . metoprolol tartrate  12.5 mg Oral Once  . pantoprazole  40 mg Oral Q0600  . PARoxetine  20 mg Oral BH-q7a  . potassium chloride SA  20 mEq Oral Daily  . potassium chloride  20 mEq Oral Once  . sodium chloride  3 mL Intravenous Q12H  . sodium chloride  3 mL Intravenous Q12H   Infusions:  . [START ON 04/08/2015] sodium chloride    . heparin 2,400 Units/hr (04/07/15 0929)  . nitroGLYCERIN 10 mcg/min (04/06/15 1718)    Assessment: 54 YOM on heparin for possible ACS and below goal (HL= 0.15) on 2400 units/hr. Hg= 14.8 (16.1 on 7/19) and plt= 168. Patient noted for cath in am.   Goal of Therapy:  Heparin level 0.3-0.7 units/ml Monitor platelets by anticoagulation protocol: Yes   Plan:   -Heparin bolus 2000 units then increase to 2750/hr -Heparin level in 6 hours and daily wth CBC daily  Harland German, Pharm D 04/07/2015 1:14 PM

## 2015-04-07 NOTE — Progress Notes (Signed)
ANTICOAGULATION CONSULT NOTE - Follow Up Consult  Pharmacy Consult for heparin Indication: NSTEMI  No Known Allergies  Patient Measurements: Height: 6\' 4"  (193 cm) Weight: 284 lb (128.822 kg) IBW/kg (Calculated) : 86.8 Heparin Dosing Weight: 115kg  Vital Signs: Temp: 98.6 F (37 C) (07/20 2001) Temp Source: Oral (07/20 2001) BP: 101/60 mmHg (07/20 2001) Pulse Rate: 81 (07/20 2001)  Labs:  Recent Labs  04/06/15 1205  04/06/15 1614  04/06/15 2129 04/07/15 0402 04/07/15 1048 04/07/15 2013  HGB 16.1  --   --   --   --  14.8  --   --   HCT 46.5  --   --   --   --  43.0  --   --   PLT 185  --   --   --   --  168  --   --   LABPROT  --   --  14.1  --   --   --   --   --   INR  --   --  1.07  --   --   --   --   --   HEPARINUNFRC  --   --   --   < > <0.10* 0.11* 0.15* 0.20*  CREATININE 0.95  --   --   --   --  0.85  --   --   TROPONINI  --   < > 0.39*  --  0.45* 0.37*  --   --   < > = values in this interval not displayed.  Estimated Creatinine Clearance: 149 mL/min (by C-G formula based on Cr of 0.85).   Medications:  Scheduled:  . aspirin EC  81 mg Oral Daily  . atorvastatin  80 mg Oral q1800  . carvedilol  12.5 mg Oral BID WC  . furosemide  40 mg Intravenous TID  . lisinopril  20 mg Oral Daily  . pantoprazole  40 mg Oral Q0600  . PARoxetine  20 mg Oral BH-q7a  . potassium chloride SA  20 mEq Oral Daily  . sodium chloride  3 mL Intravenous Q12H  . sodium chloride  3 mL Intravenous Q12H   Infusions:  . [START ON 04/08/2015] sodium chloride    . heparin 2,750 Units/hr (04/07/15 1933)  . nitroGLYCERIN 10 mcg/min (04/06/15 1718)    Assessment: 54yo male on heparin for possible ACS.  Heparin level remains below goal (HL= 0.2) on 2750 units/hr. Hg 14.8 (16.1 on 7/19) and plt = 168. Pt scheduled for cath in AM.   Goal of Therapy:  Heparin level 0.3-0.7 units/ml Monitor platelets by anticoagulation protocol: Yes   Plan:  -Heparin bolus 2000 units then increase  to 3000 units/hr -Monitor AM heparin level, CBC, s/sx of bleeding -F/U plans after cath  Waynette Buttery, PharmD Clinical Pharmacy Resident Pager: [phone] 04/07/2015 9:24 PM

## 2015-04-07 NOTE — Progress Notes (Signed)
Pt has been desatting while asleep, sogot an order for CPAP from Cards. With Respiratory assistance, have been increasing the amount of O2 kicker air but pt hovered around 90%, independently checked with another O2 monitor. Pt felt he wasn't getting any  Air, so switched over to a 55% Venti mask. Pt said it was more comfortable and felt he could sleep. Lungs sounded slightly wet, positioned pt higher in bed as he had slumped down, and is currently resting comfortably. VSS, although some tachypnea. Currently satting in low 90s. Will continue to monitor.

## 2015-04-07 NOTE — Progress Notes (Signed)
Paged Cards Fellow to see if any orders to improve breathing

## 2015-04-07 NOTE — Progress Notes (Signed)
UR Completed Treavon Castilleja Graves-Bigelow, RN,BSN [phone]  

## 2015-04-07 NOTE — Care Management Note (Signed)
Case Management Note  Patient Details  Name: [name] [name] MRN: [id number] Date of Birth: Dec 29, 1960  Subjective/Objective:  Pt admitted with cp, increased troponin initiated on IV nitro and hep gtt. Pt with increased SOB placed on Bipap. Plan for cardiac cath on 04-08-15.                   Action/Plan: No needs identified by CM at this time. Will continue to monitor.    Expected Discharge Date:                  Expected Discharge Plan:  Home/Self Care  In-House Referral:  NA  Discharge planning Services  CM Consult  Post Acute Care Choice:  NA Choice offered to:  NA  DME Arranged:  N/A DME Agency:  NA  HH Arranged:  NA HH Agency:  NA  Status of Service:  Completed, signed off  Medicare Important Message Given:    Date Medicare IM Given:    Medicare IM give by:    Date Additional Medicare IM Given:    Additional Medicare Important Message give by:     If discussed at Long Length of Stay Meetings, dates discussed:    Additional Comments:  Gala Lewandowsky, RN 04/07/2015, 11:53 AM

## 2015-04-07 NOTE — Progress Notes (Signed)
ANTICOAGULATION CONSULT NOTE - Follow Up Consult  Pharmacy Consult for heparin Indication: NSTEMI   Labs:  Recent Labs  04/06/15 1205 04/06/15 1455 04/06/15 1614 04/06/15 2129 04/07/15 0402  HGB 16.1  --   --   --  14.8  HCT 46.5  --   --   --  43.0  PLT 185  --   --   --  168  LABPROT  --   --  14.1  --   --   INR  --   --  1.07  --   --   HEPARINUNFRC  --   --   --  <0.10* 0.11*  CREATININE 0.95  --   --   --   --   TROPONINI  --  0.39* 0.39* 0.45*  --      Assessment: 54yo male remains subtherapeutic on heparin after rate increase.  Goal of Therapy:  Heparin level 0.3-0.7 units/ml   Plan:  Will rebolus with heparin 3000 units and increase gtt by 4 units/kg/hr to 2400 units/hr and check level in 6hr.  Vernard Gambles, PharmD, BCPS  04/07/2015,4:43 AM

## 2015-04-07 NOTE — Progress Notes (Signed)
Subjective:  Breathing better, no chest pain  Objective:  Vital Signs in the last 24 hours: Temp:  [97.5 F (36.4 C)-98.8 F (37.1 C)] 98.8 F (37.1 C) (07/20 0500) Pulse Rate:  [88-103] 93 (07/20 0757) Resp:  [17-35] 35 (07/20 0350) BP: (115-138)/(66-99) 121/74 mmHg (07/20 0757) SpO2:  [89 %-97 %] 92 % (07/20 0350) Weight:  [284 lb (128.822 kg)-289 lb 7 oz (131.288 kg)] 284 lb (128.822 kg) (07/20 0500)  Intake/Output from previous day:  Intake/Output Summary (Last 24 hours) at 04/07/15 0813 Last data filed at 04/07/15 0659  Gross per 24 hour  Intake     23 ml  Output   1730 ml  Net  -1707 ml    Physical Exam: General appearance: alert, cooperative, no distress and moderately obese Lungs: decreased breath sounds, no wheezing Heart: regular rate and rhythm   Rate: 80  Rhythm: normal sinus rhythm and premature ventricular contractions (PVC)  Lab Results:  Recent Labs  04/06/15 1205 04/07/15 0402  WBC 7.2 8.2  HGB 16.1 14.8  PLT 185 168    Recent Labs  04/06/15 1205 04/07/15 0402  NA 137 135  K 4.4 3.6  CL 100* 99*  CO2 29 26  GLUCOSE 124* 140*  BUN 10 14  CREATININE 0.95 0.85    Recent Labs  04/06/15 2129 04/07/15 0402  TROPONINI 0.45* 0.37*    Recent Labs  04/06/15 1614  INR 1.07    Scheduled Meds: . aspirin  324 mg Oral NOW   Or  . aspirin  300 mg Rectal NOW  . aspirin EC  81 mg Oral Daily  . atorvastatin  80 mg Oral q1800  . carvedilol  12.5 mg Oral BID WC  . furosemide  40 mg Intravenous BID  . lisinopril  20 mg Oral Daily  . pantoprazole  40 mg Oral Q0600  . PARoxetine  20 mg Oral BH-q7a  . potassium chloride SA  20 mEq Oral Daily  . sodium chloride  3 mL Intravenous Q12H   Continuous Infusions: . heparin 2,400 Units/hr (04/07/15 0450)  . nitroGLYCERIN 10 mcg/min (04/06/15 1718)   PRN Meds:.sodium chloride, acetaminophen, ALPRAZolam, [COMPLETED] levalbuterol **FOLLOWED BY** levalbuterol, nitroGLYCERIN, ondansetron  (ZOFRAN) IV, sodium chloride, zolpidem   Imaging: Dg Chest 2 View  04/06/2015   CLINICAL DATA:  Shortness of breath since this morning.  EXAM: CHEST  2 VIEW  COMPARISON:  06/05/2011  FINDINGS: The patient has extensive bilateral interstitial pulmonary edema. Tiny right effusion. New cardiomegaly with pulmonary vascular congestion. No acute osseous abnormality. Chronic accentuation of the thoracic kyphosis.  IMPRESSION: Prominent interstitial pulmonary edema. Tiny right effusion. Findings consistent with congestive heart failure.   Electronically Signed   By: Francene Boyers M.D.   On: 04/06/2015 12:53   Dg Chest Port 1 View  04/07/2015   CLINICAL DATA:  Acute onset of dyspnea.  Initial encounter.  EXAM: PORTABLE CHEST - 1 VIEW  COMPARISON:  Chest radiograph performed 04/06/2015  FINDINGS: Diffuse bilateral airspace opacification is noted, with mild sparing of the left lung apex, compatible with worsening pulmonary edema or pneumonia. Small bilateral pleural effusions are suspected. No pneumothorax is seen.  The cardiomediastinal silhouette is enlarged. No acute osseous abnormalities are identified.  IMPRESSION: Worsening diffuse bilateral airspace opacification, reflecting either worsening pulmonary edema or pneumonia. Suspect small bilateral pleural effusions. Cardiomegaly noted.   Electronically Signed   By: Roanna Raider M.D.   On: 04/07/2015 04:37     Assessment/Plan:  54 y/o  Caucasian male, single, works in heating and air, followed by Dr Antoine Poche for NICM. His EF in 2012 was 25%. He had minor CAD in 2009.  He smokes 1/2 pk a day and drinks 6-8 beers a day. He was admitted 04/06/15 with acute on chronic CHF. Troponin positive-0.45.  Active Problems:   Acute on chronic systolic heart failure   NICM- EF 25% 2012   Troponin level elevated   Essential hypertension   CAD- minor CAD 2009   Chronic systolic heart failure   Obesity   Tobacco use disorder- 1/2 pk a day   H/O ETOH abuse 6-8 beers a  day   Acute on chronic congestive heart failure   PLAN: Improved after 1.7 L diuresis. ? Cath today- will keep NPO till seen by MD.   Corine Shelter PA-C 04/07/2015, 8:13 AM [phone]

## 2015-04-07 NOTE — Progress Notes (Signed)
Gave first dose of Lasix IV, CXR done, Bipap set up, currently drawing ABG. Apprised pt of steps being taken, will continue to monitor

## 2015-04-07 NOTE — Progress Notes (Signed)
Placed patient on BIPAP 12/6 per MD order.

## 2015-04-07 NOTE — Progress Notes (Signed)
Took pt. Off BIPAP per pt. Request so he could eat breakfast. Pt. Was placed on a 6L [place]. Pt.'s vitals are stable at this time & pt. Isn't in any distress. BIPAP is in pt.'s room on standby.

## 2015-04-08 ENCOUNTER — Encounter (HOSPITAL_COMMUNITY)
Admission: EM | Disposition: A | Discharge status: Home / Self Care | Payer: Self-pay | Source: Home / Self Care | Attending: Cardiology

## 2015-04-08 ENCOUNTER — Encounter (HOSPITAL_COMMUNITY): Payer: Self-pay | Admitting: Interventional Cardiology

## 2015-04-08 ENCOUNTER — Inpatient Hospital Stay (HOSPITAL_COMMUNITY): Payer: 59

## 2015-04-08 DIAGNOSIS — I361 Nonrheumatic tricuspid (valve) insufficiency: Secondary | ICD-10-CM

## 2015-04-08 DIAGNOSIS — I4891 Unspecified atrial fibrillation: Secondary | ICD-10-CM | POA: Diagnosis not present

## 2015-04-08 DIAGNOSIS — I2510 Atherosclerotic heart disease of native coronary artery without angina pectoris: Secondary | ICD-10-CM

## 2015-04-08 HISTORY — PX: CARDIAC CATHETERIZATION: SHX172

## 2015-04-08 LAB — POCT I-STAT 3, VENOUS BLOOD GAS (G3P V)
Acid-Base Excess: 2 mmol/L (ref 0.0–2.0)
Acid-Base Excess: 3 mmol/L — ABNORMAL HIGH (ref 0.0–2.0)
BICARBONATE: 27.7 meq/L — AB (ref 20.0–24.0)
Bicarbonate: 28.1 mEq/L — ABNORMAL HIGH (ref 20.0–24.0)
O2 SAT: 60 %
O2 Saturation: 54 %
PCO2 VEN: 45 mmHg (ref 45.0–50.0)
PH VEN: 7.397 — AB (ref 7.250–7.300)
PO2 VEN: 31 mmHg (ref 30.0–45.0)
TCO2: 29 mmol/L (ref 0–100)
TCO2: 29 mmol/L (ref 0–100)
pCO2, Ven: 44.4 mmHg — ABNORMAL LOW (ref 45.0–50.0)
pH, Ven: 7.41 — ABNORMAL HIGH (ref 7.250–7.300)
pO2, Ven: 29 mmHg — CL (ref 30.0–45.0)

## 2015-04-08 LAB — POCT I-STAT 3, ART BLOOD GAS (G3+)
ACID-BASE EXCESS: 1 mmol/L (ref 0.0–2.0)
Bicarbonate: 25.9 mEq/L — ABNORMAL HIGH (ref 20.0–24.0)
O2 Saturation: 92 %
PH ART: 7.422 (ref 7.350–7.450)
TCO2: 27 mmol/L (ref 0–100)
pCO2 arterial: 39.8 mmHg (ref 35.0–45.0)
pO2, Arterial: 63 mmHg — ABNORMAL LOW (ref 80.0–100.0)

## 2015-04-08 LAB — URINALYSIS, ROUTINE W REFLEX MICROSCOPIC
Bilirubin Urine: NEGATIVE
Glucose, UA: NEGATIVE mg/dL
Hgb urine dipstick: NEGATIVE
KETONES UR: 15 mg/dL — AB
Nitrite: NEGATIVE
Protein, ur: NEGATIVE mg/dL
Specific Gravity, Urine: 1.011 (ref 1.005–1.030)
Urobilinogen, UA: 1 mg/dL (ref 0.0–1.0)
pH: 6.5 (ref 5.0–8.0)

## 2015-04-08 LAB — URINE MICROSCOPIC-ADD ON

## 2015-04-08 LAB — POCT ACTIVATED CLOTTING TIME: Activated Clotting Time: 147 seconds

## 2015-04-08 LAB — MAGNESIUM: Magnesium: 1.9 mg/dL (ref 1.7–2.4)

## 2015-04-08 LAB — CBC
HCT: 38.4 % — ABNORMAL LOW (ref 39.0–52.0)
Hemoglobin: 13 g/dL (ref 13.0–17.0)
MCH: 31.7 pg (ref 26.0–34.0)
MCHC: 33.9 g/dL (ref 30.0–36.0)
MCV: 93.7 fL (ref 78.0–100.0)
Platelets: 133 10*3/uL — ABNORMAL LOW (ref 150–400)
RBC: 4.1 MIL/uL — ABNORMAL LOW (ref 4.22–5.81)
RDW: 13.9 % (ref 11.5–15.5)
WBC: 5.7 10*3/uL (ref 4.0–10.5)

## 2015-04-08 LAB — HEPARIN LEVEL (UNFRACTIONATED): HEPARIN UNFRACTIONATED: 0.32 [IU]/mL (ref 0.30–0.70)

## 2015-04-08 LAB — PROTIME-INR
INR: 1.14 (ref 0.00–1.49)
Prothrombin Time: 14.8 seconds (ref 11.6–15.2)

## 2015-04-08 SURGERY — RIGHT/LEFT HEART CATH AND CORONARY ANGIOGRAPHY

## 2015-04-08 MED ORDER — HEPARIN SODIUM (PORCINE) 1000 UNIT/ML IJ SOLN
INTRAMUSCULAR | Status: DC | PRN
  Administered 2015-04-08: 6000 [IU] via INTRAVENOUS

## 2015-04-08 MED ORDER — IOHEXOL 350 MG/ML SOLN
INTRAVENOUS | Status: DC | PRN
  Administered 2015-04-08: 60 mL via INTRA_ARTERIAL

## 2015-04-08 MED ORDER — LIDOCAINE HCL (PF) 1 % IJ SOLN
INTRAMUSCULAR | Status: DC | PRN
  Administered 2015-04-08: 20 mL via INTRADERMAL

## 2015-04-08 MED ORDER — HEPARIN SODIUM (PORCINE) 1000 UNIT/ML IJ SOLN
INTRAMUSCULAR | Status: AC
  Filled 2015-04-08: qty 1

## 2015-04-08 MED ORDER — SODIUM CHLORIDE 0.9 % IV BOLUS (SEPSIS): 250.0000 mL | Freq: Once | INTRAVENOUS | Status: DC

## 2015-04-08 MED ORDER — DILTIAZEM LOAD VIA INFUSION
10.0000 mg | Freq: Once | INTRAVENOUS | Status: AC
Start: 2015-04-08 — End: 2015-04-08
  Administered 2015-04-08: 10 mg via INTRAVENOUS
  Filled 2015-04-08: qty 10

## 2015-04-08 MED ORDER — LIDOCAINE HCL (PF) 1 % IJ SOLN
INTRAMUSCULAR | Status: DC | PRN
  Administered 2015-04-08: 14:00:00

## 2015-04-08 MED ORDER — VERAPAMIL HCL 2.5 MG/ML IV SOLN
INTRAVENOUS | Status: AC
  Filled 2015-04-08: qty 2

## 2015-04-08 MED ORDER — TRAMADOL HCL 50 MG PO TABS
50.0000 mg | ORAL_TABLET | Freq: Four times a day (QID) | ORAL | Status: DC | PRN
  Administered 2015-04-08 – 2015-04-14 (×2): 50 mg via ORAL
  Filled 2015-04-08 (×2): qty 1

## 2015-04-08 MED ORDER — ONDANSETRON HCL 4 MG/2ML IJ SOLN: 4.0000 mg | Freq: Four times a day (QID) | INTRAMUSCULAR | Status: DC | PRN

## 2015-04-08 MED ORDER — MIDAZOLAM HCL 2 MG/2ML IJ SOLN
INTRAMUSCULAR | Status: AC
  Filled 2015-04-08: qty 2

## 2015-04-08 MED ORDER — VERAPAMIL HCL 2.5 MG/ML IV SOLN
INTRAVENOUS | Status: DC | PRN
  Administered 2015-04-08: 13:00:00 via INTRA_ARTERIAL

## 2015-04-08 MED ORDER — HEPARIN (PORCINE) IN NACL 2-0.9 UNIT/ML-% IJ SOLN
INTRAMUSCULAR | Status: AC
  Filled 2015-04-08: qty 1500

## 2015-04-08 MED ORDER — SODIUM CHLORIDE 0.9 % IV BOLUS (SEPSIS)
250.0000 mL | Freq: Once | INTRAVENOUS | Status: AC
  Administered 2015-04-08: 250 mL via INTRAVENOUS

## 2015-04-08 MED ORDER — MIDAZOLAM HCL 2 MG/2ML IJ SOLN
INTRAMUSCULAR | Status: DC | PRN
  Administered 2015-04-08: 1 mg via INTRAVENOUS

## 2015-04-08 MED ORDER — HEPARIN (PORCINE) IN NACL 100-0.45 UNIT/ML-% IJ SOLN
3200.0000 [IU]/h | INTRAMUSCULAR | Status: DC
  Administered 2015-04-08: 3000 [IU]/h via INTRAVENOUS
  Administered 2015-04-09: 3200 [IU]/h via INTRAVENOUS
  Administered 2015-04-09: 3000 [IU]/h via INTRAVENOUS
  Administered 2015-04-09 – 2015-04-12 (×7): 3200 [IU]/h via INTRAVENOUS
  Administered 2015-04-14: 3350 [IU]/h via INTRAVENOUS
  Filled 2015-04-08 (×16): qty 250

## 2015-04-08 MED ORDER — SODIUM CHLORIDE 0.9 % IJ SOLN: 3.0000 mL | Freq: Two times a day (BID) | INTRAMUSCULAR | Status: DC

## 2015-04-08 MED ORDER — SODIUM CHLORIDE 0.9 % IJ SOLN: 3.0000 mL | INTRAMUSCULAR | Status: DC | PRN

## 2015-04-08 MED ORDER — ACETAMINOPHEN 325 MG PO TABS: 650.0000 mg | ORAL_TABLET | ORAL | Status: DC | PRN

## 2015-04-08 MED ORDER — POTASSIUM CHLORIDE CRYS ER 20 MEQ PO TBCR
40.0000 meq | EXTENDED_RELEASE_TABLET | Freq: Once | ORAL | Status: AC
  Administered 2015-04-08: 40 meq via ORAL
  Filled 2015-04-08: qty 2

## 2015-04-08 MED ORDER — POTASSIUM CHLORIDE CRYS ER 20 MEQ PO TBCR
20.0000 meq | EXTENDED_RELEASE_TABLET | Freq: Every day | ORAL | Status: DC
  Administered 2015-04-09: 20 meq via ORAL
  Filled 2015-04-08: qty 1

## 2015-04-08 MED ORDER — LIDOCAINE HCL (PF) 1 % IJ SOLN
INTRAMUSCULAR | Status: AC
  Filled 2015-04-08: qty 30

## 2015-04-08 MED ORDER — MUPIROCIN 2 % EX OINT
1.0000 "application " | TOPICAL_OINTMENT | Freq: Two times a day (BID) | CUTANEOUS | Status: AC
  Administered 2015-04-08 – 2015-04-12 (×10): 1 via NASAL
  Filled 2015-04-08 (×5): qty 22

## 2015-04-08 MED ORDER — SODIUM CHLORIDE 0.9 % IV SOLN: 250.0000 mL | INTRAVENOUS | Status: DC | PRN

## 2015-04-08 MED ORDER — CHLORHEXIDINE GLUCONATE CLOTH 2 % EX PADS
6.0000 | MEDICATED_PAD | Freq: Every day | CUTANEOUS | Status: AC
  Administered 2015-04-08 – 2015-04-12 (×4): 6 via TOPICAL

## 2015-04-08 MED ORDER — FENTANYL CITRATE (PF) 100 MCG/2ML IJ SOLN
INTRAMUSCULAR | Status: AC
Start: 2015-04-08 — End: 2015-04-08
  Filled 2015-04-08: qty 2

## 2015-04-08 MED ORDER — FENTANYL CITRATE (PF) 100 MCG/2ML IJ SOLN
INTRAMUSCULAR | Status: DC | PRN
  Administered 2015-04-08: 25 ug via INTRAVENOUS

## 2015-04-08 MED ORDER — DILTIAZEM HCL 100 MG IV SOLR
5.0000 mg/h | INTRAVENOUS | Status: DC
  Administered 2015-04-08: 5 mg/h via INTRAVENOUS
  Filled 2015-04-08: qty 100

## 2015-04-08 SURGICAL SUPPLY — 16 items
CATH INFINITI 5 FR JL3.5 (CATHETERS) ×2 IMPLANT
CATH INFINITI 5FR ANG PIGTAIL (CATHETERS) ×2 IMPLANT
CATH INFINITI 5FR MULTPACK ANG (CATHETERS) ×2 IMPLANT
CATH INFINITI JR4 5F (CATHETERS) ×2 IMPLANT
CATH SWAN GANZ 7F STRAIGHT (CATHETERS) ×2 IMPLANT
DEVICE RAD COMP TR BAND LRG (VASCULAR PRODUCTS) ×2 IMPLANT
GLIDESHEATH SLEND SS 6F .021 (SHEATH) ×2 IMPLANT
KIT HEART LEFT (KITS) ×2 IMPLANT
KIT HEART RIGHT NAMIC (KITS) ×2 IMPLANT
PACK CARDIAC CATHETERIZATION (CUSTOM PROCEDURE TRAY) ×2 IMPLANT
SHEATH PINNACLE 7F 10CM (SHEATH) ×2 IMPLANT
SYR MEDRAD MARK V 150ML (SYRINGE) ×2 IMPLANT
TRANSDUCER W/STOPCOCK (MISCELLANEOUS) ×4 IMPLANT
TUBING CIL FLEX 10 FLL-RA (TUBING) ×2 IMPLANT
WIRE EMERALD 3MM-J .025X260CM (WIRE) ×2 IMPLANT
WIRE SAFE-T 1.5MM-J .035X260CM (WIRE) ×2 IMPLANT

## 2015-04-08 NOTE — Progress Notes (Signed)
ANTICOAGULATION CONSULT NOTE - Follow Up Consult  Pharmacy Consult for heparin Indication: NSTEMI   Labs:  Recent Labs  04/06/15 1205  04/06/15 1614  04/06/15 2129 04/07/15 0402 04/07/15 1048 04/07/15 2013 04/08/15 0336  HGB 16.1  --   --   --   --  14.8  --   --  13.0  HCT 46.5  --   --   --   --  43.0  --   --  38.4*  PLT 185  --   --   --   --  168  --   --  133*  LABPROT  --   --  14.1  --   --   --   --   --  14.8  INR  --   --  1.07  --   --   --   --   --  1.14  HEPARINUNFRC  --   --   --   < > <0.10* 0.11* 0.15* 0.20* 0.32  CREATININE 0.95  --   --   --   --  0.85  --   --   --   TROPONINI  --   < > 0.39*  --  0.45* 0.37*  --   --   --   < > = values in this interval not displayed.   Assessment/Plan:  54yo male therapeutic on heparin after multiple rate increases, now at high rate. Will continue gtt at current rate and confirm stable with additional level.   Vernard Gambles, PharmD, BCPS  04/08/2015,5:17 AM

## 2015-04-08 NOTE — Progress Notes (Signed)
Echocardiogram 2D Echocardiogram has been performed.  [name] [name] 04/08/2015, 9:49 AM

## 2015-04-08 NOTE — Progress Notes (Signed)
ANTICOAGULATION CONSULT NOTE - Follow Up Consult  Pharmacy Consult for heparin Indication: Afib  No Known Allergies  Patient Measurements: Height: 6\' 4"  (193 cm) Weight: 283 lb 11.2 oz (128.685 kg) IBW/kg (Calculated) : 86.8 Heparin Dosing Weight: 115kg  Vital Signs: BP: 121/50 mmHg (07/21 1706) Pulse Rate: 37 (07/21 1706)  Labs:  Recent Labs  04/06/15 1205  04/06/15 1614  04/06/15 2129 04/07/15 0402 04/07/15 1048 04/07/15 2013 04/08/15 0336  HGB 16.1  --   --   --   --  14.8  --   --  13.0  HCT 46.5  --   --   --   --  43.0  --   --  38.4*  PLT 185  --   --   --   --  168  --   --  133*  LABPROT  --   --  14.1  --   --   --   --   --  14.8  INR  --   --  1.07  --   --   --   --   --  1.14  HEPARINUNFRC  --   --   --   < > <0.10* 0.11* 0.15* 0.20* 0.32  CREATININE 0.95  --   --   --   --  0.85  --   --   --   TROPONINI  --   < > 0.39*  --  0.45* 0.37*  --   --   --   < > = values in this interval not displayed.  Estimated Creatinine Clearance: 149 mL/min (by C-G formula based on Cr of 0.85).   Medications:  Scheduled:  . aspirin EC  81 mg Oral Daily  . atorvastatin  80 mg Oral q1800  . carvedilol  12.5 mg Oral BID WC  . Chlorhexidine Gluconate Cloth  6 each Topical Q0600  . mupirocin ointment  1 application Nasal BID  . pantoprazole  40 mg Oral Q0600  . PARoxetine  20 mg Oral BH-q7a  . [START ON 04/09/2015] potassium chloride SA  20 mEq Oral Daily  . sodium chloride  3 mL Intravenous Q12H  . sodium chloride  3 mL Intravenous Q12H  . sodium chloride  3 mL Intravenous Q12H   Infusions:  . heparin Stopped (04/08/15 6767)    Assessment: 54yo male to resume heparin for Afib s/p cath, sheath removed at 1330 today (7/21).  Previous heparin level was therapeutic while heparin running at 3000 units/hr.  Goal of Therapy:  Heparin level 0.3-0.7 units/ml Monitor platelets by anticoagulation protocol: Yes   Plan:  -Resume heparin drip at 3000 units/hr (no bolus)  at 2130 tonight -Check 6hr HL -Monitor daily heparin level, CBC, s/sx of bleeding -F/u long term [facility] plans  Waynette Buttery, PharmD Clinical Pharmacy Resident Pager: [phone] 04/08/2015 6:34 PM

## 2015-04-08 NOTE — Progress Notes (Addendum)
  Subjective:  Breathing better, no chest pain, able to lay flat.  Objective:  Vital Signs in the last 24 hours: Temp:  [98.3 F (36.8 C)-98.6 F (37 C)] 98.3 F (36.8 C) (07/21 0400) Pulse Rate:  [80-145] 145 (07/21 0717) Resp:  [16-26] 23 (07/21 0717) BP: (99-131)/(60-86) 131/86 mmHg (07/21 0717) SpO2:  [95 %-97 %] 95 % (07/21 0717) Weight:  [283 lb 11.2 oz (128.685 kg)] 283 lb 11.2 oz (128.685 kg) (07/21 0400)  Intake/Output from previous day:  Intake/Output Summary (Last 24 hours) at 04/08/15 0746 Last data filed at 04/08/15 0700  Gross per 24 hour  Intake  542.2 ml  Output   1650 ml  Net -1107.8 ml    Physical Exam: General appearance: alert, cooperative, no distress and moderately obese Lungs: decreased breath sounds, no wheezing Heart: irregular rhythm, increased rate   Rate: 80  Rhythm: AF with VR 110-130 Lab Results:  Recent Labs  04/07/15 0402 04/08/15 0336  WBC 8.2 5.7  HGB 14.8 13.0  PLT 168 133*    Recent Labs  04/06/15 1205 04/07/15 0402  NA 137 135  K 4.4 3.6  CL 100* 99*  CO2 29 26  GLUCOSE 124* 140*  BUN 10 14  CREATININE 0.95 0.85    Recent Labs  04/06/15 2129 04/07/15 0402  TROPONINI 0.45* 0.37*    Recent Labs  04/08/15 0336  INR 1.14    Scheduled Meds: . aspirin EC  81 mg Oral Daily  . atorvastatin  80 mg Oral q1800  . carvedilol  12.5 mg Oral BID WC  . Chlorhexidine Gluconate Cloth  6 each Topical Q0600  . furosemide  40 mg Intravenous TID  . lisinopril  20 mg Oral Daily  . mupirocin ointment  1 application Nasal BID  . pantoprazole  40 mg Oral Q0600  . PARoxetine  20 mg Oral BH-q7a  . potassium chloride SA  20 mEq Oral Daily  . sodium chloride  3 mL Intravenous Q12H  . sodium chloride  3 mL Intravenous Q12H   Continuous Infusions: . sodium chloride 10 mL/hr at 04/08/15 0639  . heparin Stopped (04/08/15 0655)  . nitroGLYCERIN 10 mcg/min (04/06/15 1718)   PRN Meds:.sodium chloride, sodium chloride,  acetaminophen, ALPRAZolam, [COMPLETED] levalbuterol **FOLLOWED BY** levalbuterol, nitroGLYCERIN, ondansetron (ZOFRAN) IV, sodium chloride, sodium chloride, zolpidem   Imaging: Dg Chest 2 View  04/06/2015   CLINICAL DATA:  Shortness of breath since this morning.  EXAM: CHEST  2 VIEW  COMPARISON:  06/05/2011  FINDINGS: The patient has extensive bilateral interstitial pulmonary edema. Tiny right effusion. New cardiomegaly with pulmonary vascular congestion. No acute osseous abnormality. Chronic accentuation of the thoracic kyphosis.  IMPRESSION: Prominent interstitial pulmonary edema. Tiny right effusion. Findings consistent with congestive heart failure.   Electronically Signed   By: James  Maxwell M.D.   On: 04/06/2015 12:53   Dg Chest Port 1 View  04/07/2015   CLINICAL DATA:  Acute onset of dyspnea.  Initial encounter.  EXAM: PORTABLE CHEST - 1 VIEW  COMPARISON:  Chest radiograph performed 04/06/2015  FINDINGS: Diffuse bilateral airspace opacification is noted, with mild sparing of the left lung apex, compatible with worsening pulmonary edema or pneumonia. Small bilateral pleural effusions are suspected. No pneumothorax is seen.  The cardiomediastinal silhouette is enlarged. No acute osseous abnormalities are identified.  IMPRESSION: Worsening diffuse bilateral airspace opacification, reflecting either worsening pulmonary edema or pneumonia. Suspect small bilateral pleural effusions. Cardiomegaly noted.   Electronically Signed   By: Jeffery    Chang M.D.   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 His EF in 2012 was 25%. He had minor CAD in 2009.  He smokes 1/2 pk a day and drinks 6-8 beers a day. He was admitted 04/06/15 with acute on chronic CHF. Troponin positive-0.45. Pt went into AF with RVR this am- he was unaware of this.   Active Problems:   Acute on chronic systolic heart failure   NICM- EF 25% 2012    Troponin level elevated   Essential hypertension   CAD- minor CAD 2009   Chronic systolic heart failure   Atrial fibrillation with RVR   Obesity   Tobacco use disorder- 1/2 pk a day   H/O ETOH abuse 6-8 beers a day   Acute on chronic congestive heart failure   PLAN:  Improved after 2.7 L diuresis. He is on Lasix 40 mg IV TID. Extra K+ this am. Check Mg++.   During night shift Heparin was stopped secondary to "pink urine"- will resume.   Cath today. Add IV Diltiazem for rate control.   Corine Shelter PA-C 04/08/2015, 7:46 AM [phone]  After Diltiazem bolus and drip started at 5 mg hr pt's B/P down to 80 systolic. Will stop Diltiazem and IV NTG. Hold Lasix and Lisinopril this am- 250 cc fluid bolus.  Corine Shelter PA-C 04/08/2015 9:03 AM

## 2015-04-08 NOTE — Progress Notes (Signed)
Pt HR noted to be in 130's-140's, BP 131/86, asymptomatic, pt rhythm appears to now be afib.  Lucile Crater, PA notified and orders received to obtain EKG.  Will continue to closely monitor.

## 2015-04-08 NOTE — H&P (View-Only) (Signed)
Subjective:  Breathing better, no chest pain, able to lay flat.  Objective:  Vital Signs in the last 24 hours: Temp:  [98.3 F (36.8 C)-98.6 F (37 C)] 98.3 F (36.8 C) (07/21 0400) Pulse Rate:  [80-145] 145 (07/21 0717) Resp:  [16-26] 23 (07/21 0717) BP: (99-131)/(60-86) 131/86 mmHg (07/21 0717) SpO2:  [95 %-97 %] 95 % (07/21 0717) Weight:  [283 lb 11.2 oz (128.685 kg)] 283 lb 11.2 oz (128.685 kg) (07/21 0400)  Intake/Output from previous day:  Intake/Output Summary (Last 24 hours) at 04/08/15 0746 Last data filed at 04/08/15 0700  Gross per 24 hour  Intake  542.2 ml  Output   1650 ml  Net -1107.8 ml    Physical Exam: General appearance: alert, cooperative, no distress and moderately obese Lungs: decreased breath sounds, no wheezing Heart: irregular rhythm, increased rate   Rate: 80  Rhythm: AF with VR 110-130 Lab Results:  Recent Labs  04/07/15 0402 04/08/15 0336  WBC 8.2 5.7  HGB 14.8 13.0  PLT 168 133*    Recent Labs  04/06/15 1205 04/07/15 0402  NA 137 135  K 4.4 3.6  CL 100* 99*  CO2 29 26  GLUCOSE 124* 140*  BUN 10 14  CREATININE 0.95 0.85    Recent Labs  04/06/15 2129 04/07/15 0402  TROPONINI 0.45* 0.37*    Recent Labs  04/08/15 0336  INR 1.14    Scheduled Meds: . aspirin EC  81 mg Oral Daily  . atorvastatin  80 mg Oral q1800  . carvedilol  12.5 mg Oral BID WC  . Chlorhexidine Gluconate Cloth  6 each Topical Q0600  . furosemide  40 mg Intravenous TID  . lisinopril  20 mg Oral Daily  . mupirocin ointment  1 application Nasal BID  . pantoprazole  40 mg Oral Q0600  . PARoxetine  20 mg Oral BH-q7a  . potassium chloride SA  20 mEq Oral Daily  . sodium chloride  3 mL Intravenous Q12H  . sodium chloride  3 mL Intravenous Q12H   Continuous Infusions: . sodium chloride 10 mL/hr at 04/08/15 0639  . heparin Stopped (04/08/15 0655)  . nitroGLYCERIN 10 mcg/min (04/06/15 1718)   PRN Meds:.sodium chloride, sodium chloride,  acetaminophen, ALPRAZolam, [COMPLETED] levalbuterol **FOLLOWED BY** levalbuterol, nitroGLYCERIN, ondansetron (ZOFRAN) IV, sodium chloride, sodium chloride, zolpidem   Imaging: Dg Chest 2 View  04/06/2015   CLINICAL DATA:  Shortness of breath since this morning.  EXAM: CHEST  2 VIEW  COMPARISON:  06/05/2011  FINDINGS: The patient has extensive bilateral interstitial pulmonary edema. Tiny right effusion. New cardiomegaly with pulmonary vascular congestion. No acute osseous abnormality. Chronic accentuation of the thoracic kyphosis.  IMPRESSION: Prominent interstitial pulmonary edema. Tiny right effusion. Findings consistent with congestive heart failure.   Electronically Signed   By: Francene Boyers M.D.   On: 04/06/2015 12:53   Dg Chest Port 1 View  04/07/2015   CLINICAL DATA:  Acute onset of dyspnea.  Initial encounter.  EXAM: PORTABLE CHEST - 1 VIEW  COMPARISON:  Chest radiograph performed 04/06/2015  FINDINGS: Diffuse bilateral airspace opacification is noted, with mild sparing of the left lung apex, compatible with worsening pulmonary edema or pneumonia. Small bilateral pleural effusions are suspected. No pneumothorax is seen.  The cardiomediastinal silhouette is enlarged. No acute osseous abnormalities are identified.  IMPRESSION: Worsening diffuse bilateral airspace opacification, reflecting either worsening pulmonary edema or pneumonia. Suspect small bilateral pleural effusions. Cardiomegaly noted.   Electronically Signed   By: Leotis Shames  Chang M.D.   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 His EF in 2012 was 25%. He had minor CAD in 2009.  He smokes 1/2 pk a day and drinks 6-8 beers a day. He was admitted 04/06/15 with acute on chronic CHF. Troponin positive-0.45. Pt went into AF with RVR this am- he was unaware of this.   Active Problems:   Acute on chronic systolic heart failure   NICM- EF 25% 2012    Troponin level elevated   Essential hypertension   CAD- minor CAD 2009   Chronic systolic heart failure   Atrial fibrillation with RVR   Obesity   Tobacco use disorder- 1/2 pk a day   H/O ETOH abuse 6-8 beers a day   Acute on chronic congestive heart failure   PLAN:  Improved after 2.7 L diuresis. He is on Lasix 40 mg IV TID. Extra K+ this am. Check Mg++.   During night shift Heparin was stopped secondary to "pink urine"- will resume.   Cath today. Add IV Diltiazem for rate control.   Corine Shelter PA-C 04/08/2015, 7:46 AM [phone]  After Diltiazem bolus and drip started at 5 mg hr pt's B/P down to 80 systolic. Will stop Diltiazem and IV NTG. Hold Lasix and Lisinopril this am- 250 cc fluid bolus.  Corine Shelter PA-C 04/08/2015 9:03 AM

## 2015-04-08 NOTE — Progress Notes (Signed)
In and out of AF. EF was 15% by echo. Suspect he will need long term anticoagulation. Will resume IV Heparin 8 hrs post cath.  Corine Shelter PA-C 04/08/2015 6:28 PM

## 2015-04-08 NOTE — Progress Notes (Signed)
Site area: right groin a 7 french venous sheath was removed  Site Prior to Removal:  Level 0  Pressure Applied For 10 MINUTES    Minutes Beginning at 1340p  Manual:   Yes.    Patient Status During Pull:  stable  Post Pull Groin Site:  Level 0  Post Pull Instructions Given:  Yes.    Post Pull Pulses Present:  Yes.    Dressing Applied:  Yes.    Comments:  Pt remains stable during sheath pull.  Pt denies any discomfort at this time.

## 2015-04-08 NOTE — Progress Notes (Signed)
Pt BP 86/56 manual and asymptomatic.  Corine Shelter, [place] on floor and asked to come into pt room.  Verbal orders received to turn off cardizem IV, Nitro IV, and to give a 250 mL NS bolus.  Will continue to closely monitor.

## 2015-04-08 NOTE — Progress Notes (Signed)
Placed patient on CPAP for the night.  Patient is tolerating well at this time. 

## 2015-04-08 NOTE — Interval H&P Note (Signed)
Cath Lab Visit (complete for each Cath Lab visit)  Clinical Evaluation Leading to the Procedure:   ACS: Yes.    Non-ACS:    Anginal Classification: CCS IV  Anti-ischemic medical therapy: Minimal Therapy (1 class of medications)  Non-Invasive Test Results: No non-invasive testing performed  Prior CABG: No previous CABG   TIMI Score  Patient Information:  TIMI Score is 2  UA/NSTEMI and low-risk features (e.g., TIMI score  Revascularization of the presumed culprit artery   U (6)  Indication: 9; Score: 6    History and Physical Interval Note:  04/08/2015 12:28 PM  [name] [name]  has presented today for surgery, with the diagnosis of cp  The various methods of treatment have been discussed with the patient and family. After consideration of risks, benefits and other options for treatment, the patient has consented to  Procedure(s): Right/Left Heart Cath and Coronary Angiography (N/A) as a surgical intervention .  The patient's history has been reviewed, patient examined, no change in status, stable for surgery.  I have reviewed the patient's chart and labs.  Questions were answered to the patient's satisfaction.     [name] [name] S.

## 2015-04-08 NOTE — Progress Notes (Signed)
RN spoke with Ronie Spies on call for cardiology.  RN updated Dunn pertaining to patient's urine color this morning being amber with some red tint.  Per Dunn hold patient's IV heparin, notify pharmacy (RN spoke with Barkley Bruns, pharmacist) and per Dunn entered order to obtain urine for urinalysis.

## 2015-04-08 NOTE — Progress Notes (Signed)
Pt rhythm appears to have switched back to SR with LBBB, ekg obtained to confirm.  At 1027 pt had 12 bt run of vtach and bp 85/56 after 250 mL NS bolus.  Corine Shelter, [place] notified and orders received to give another 250 mL NS bolus to bring systolic bp above 90.  Will continue to closely monitor.

## 2015-04-09 DIAGNOSIS — I472 Ventricular tachycardia: Secondary | ICD-10-CM

## 2015-04-09 LAB — CBC
HCT: 38.2 % — ABNORMAL LOW (ref 39.0–52.0)
Hemoglobin: 13.1 g/dL (ref 13.0–17.0)
MCH: 31.8 pg (ref 26.0–34.0)
MCHC: 34.3 g/dL (ref 30.0–36.0)
MCV: 92.7 fL (ref 78.0–100.0)
Platelets: 146 10*3/uL — ABNORMAL LOW (ref 150–400)
RBC: 4.12 MIL/uL — AB (ref 4.22–5.81)
RDW: 13.9 % (ref 11.5–15.5)
WBC: 5.1 10*3/uL (ref 4.0–10.5)

## 2015-04-09 LAB — BASIC METABOLIC PANEL
Anion gap: 7 (ref 5–15)
BUN: 20 mg/dL (ref 6–20)
CO2: 28 mmol/L (ref 22–32)
CREATININE: 0.78 mg/dL (ref 0.61–1.24)
Calcium: 8.5 mg/dL — ABNORMAL LOW (ref 8.9–10.3)
Chloride: 102 mmol/L (ref 101–111)
GFR calc Af Amer: >60 mL/min (ref >60)
GLUCOSE: 118 mg/dL — AB (ref 65–99)
Potassium: 3.4 mmol/L — ABNORMAL LOW (ref 3.5–5.1)
Sodium: 137 mmol/L (ref 135–145)

## 2015-04-09 LAB — HEPARIN LEVEL (UNFRACTIONATED)
HEPARIN UNFRACTIONATED: 0.51 [IU]/mL (ref 0.30–0.70)
Heparin Unfractionated: 0.41 IU/mL (ref 0.30–0.70)
Heparin Unfractionated: 0.18 IU/mL — ABNORMAL LOW (ref 0.30–0.70)

## 2015-04-09 MED ORDER — FUROSEMIDE 10 MG/ML IJ SOLN
40.0000 mg | Freq: Two times a day (BID) | INTRAMUSCULAR | Status: DC
  Administered 2015-04-09 – 2015-04-12 (×8): 40 mg via INTRAVENOUS
  Filled 2015-04-09 (×8): qty 4

## 2015-04-09 MED ORDER — COUMADIN BOOK
Freq: Once | Status: AC
Start: 2015-04-09 — End: 2015-04-09
  Administered 2015-04-09: 18:00:00
  Filled 2015-04-09: qty 1

## 2015-04-09 MED ORDER — LISINOPRIL 2.5 MG PO TABS
2.5000 mg | ORAL_TABLET | Freq: Every day | ORAL | Status: DC
  Administered 2015-04-09 – 2015-04-15 (×6): 2.5 mg via ORAL
  Filled 2015-04-09 (×7): qty 1

## 2015-04-09 MED ORDER — MAGNESIUM SULFATE 2 GM/50ML IV SOLN
2.0000 g | Freq: Once | INTRAVENOUS | Status: AC
  Administered 2015-04-09: 2 g via INTRAVENOUS
  Filled 2015-04-09: qty 50

## 2015-04-09 MED ORDER — WARFARIN VIDEO
Freq: Once | Status: AC
  Administered 2015-04-09: 18:00:00

## 2015-04-09 MED ORDER — POTASSIUM CHLORIDE CRYS ER 20 MEQ PO TBCR
40.0000 meq | EXTENDED_RELEASE_TABLET | Freq: Two times a day (BID) | ORAL | Status: DC
  Administered 2015-04-09 – 2015-04-15 (×11): 40 meq via ORAL
  Filled 2015-04-09 (×11): qty 2

## 2015-04-09 MED ORDER — POTASSIUM CHLORIDE CRYS ER 20 MEQ PO TBCR
40.0000 meq | EXTENDED_RELEASE_TABLET | Freq: Once | ORAL | Status: AC
  Administered 2015-04-09: 40 meq via ORAL
  Filled 2015-04-09: qty 2

## 2015-04-09 MED ORDER — WARFARIN SODIUM 10 MG PO TABS
10.0000 mg | ORAL_TABLET | Freq: Once | ORAL | Status: AC
  Administered 2015-04-09: 10 mg via ORAL
  Filled 2015-04-09: qty 1

## 2015-04-09 MED ORDER — WARFARIN - PHARMACIST DOSING INPATIENT: Freq: Every day | Status: DC

## 2015-04-09 MED ORDER — AMIODARONE HCL 200 MG PO TABS
400.0000 mg | ORAL_TABLET | Freq: Three times a day (TID) | ORAL | Status: DC
  Administered 2015-04-09 – 2015-04-15 (×19): 400 mg via ORAL
  Filled 2015-04-09 (×19): qty 2

## 2015-04-09 NOTE — Progress Notes (Signed)
ANTICOAGULATION CONSULT NOTE - Follow Up Consult  Pharmacy Consult for Heparin  Indication: atrial fibrillation  No Known Allergies  Patient Measurements: Height: 6\' 4"  (193 cm) Weight: 283 lb 11.2 oz (128.685 kg) IBW/kg (Calculated) : 86.8  Vital Signs: Temp: 98 F (36.7 C) (07/22 0000) Temp Source: Oral (07/22 0000) BP: 95/46 mmHg (07/22 0000) Pulse Rate: 62 (07/22 0000)  Labs:  Recent Labs  04/06/15 1205  04/06/15 1614  04/06/15 2129 04/07/15 0402  04/07/15 2013 04/08/15 0336 04/09/15 0325  HGB 16.1  --   --   --   --  14.8  --   --  13.0 13.1  HCT 46.5  --   --   --   --  43.0  --   --  38.4* 38.2*  PLT 185  --   --   --   --  168  --   --  133* 146*  LABPROT  --   --  14.1  --   --   --   --   --  14.8  --   INR  --   --  1.07  --   --   --   --   --  1.14  --   HEPARINUNFRC  --   --   --   < > <0.10* 0.11*  < > 0.20* 0.32 0.41  CREATININE 0.95  --   --   --   --  0.85  --   --   --  0.78  TROPONINI  --   < > 0.39*  --  0.45* 0.37*  --   --   --   --   < > = values in this interval not displayed.  Estimated Creatinine Clearance: 158.3 mL/min (by C-G formula based on Cr of 0.78).   Assessment: First HL after re-start is therapeutic at 0.41  Goal of Therapy:  Heparin level 0.3-0.7 units/ml Monitor platelets by anticoagulation protocol: Yes   Plan:  -Continue heparin at 3000 units/hr -1200 HL -Daily CBC/HL -Monitor for bleeding -F/U plans for long term anti-coagulation (likely needed per provider note)  Abran Duke 04/09/2015,4:41 AM

## 2015-04-09 NOTE — Consult Note (Addendum)
Patient ID: [name] [name] MRN: [id number], DOB/AGE: 04/23/1961   Admit date: 04/06/2015   Primary Physician: [name], MD Primary Cardiologist: Dr. Antoine Poche  Pt. Profile:  54 y/o male with NICM, EF 20-25% by echo and PAF with 30 beat run of VT  Problem List  Past Medical History  Diagnosis Date  . HTN (hypertension)   . OSA on CPAP   . CAD (coronary artery disease)     Mild nonobstructive (Cath 09)  . Ventricular tachycardia   . Dyspnea   . Obesity, morbid   . Chronic systolic heart failure     Nonischemic CM: echo 4/12 with EF 25-30%, grade 2 diast dysfxn, mild dilated aortic root 43 mm, trivial MR, mod LAE  . Hypersomnia   . Tobacco use disorder   . H/O ETOH abuse   . CHF (congestive heart failure)     Past Surgical History  Procedure Laterality Date  . Hydrocele excision / repair    . Tumor excision Left   . Vasectomy    . Cardiac catheterization N/A 04/08/2015    Procedure: Right/Left Heart Cath and Coronary Angiography;  Surgeon: Corky Crafts, MD;  Location: [facility] INVASIVE CV LAB;  Service: Cardiovascular;  Laterality: N/A;     Allergies  No Known Allergies  HPI  54 y/o male with a h/o NICM/ chronic systolic CHF. He had minor CAD on cath in 2009. EF in 2012 was 25%. He was followed by Dr. Antoine Poche but was lost to f/u and not seen in clinic since 2013. Other medical issues include HTN, OSA, PAF with CHA2DS2 VASc score of 2 and tobacco and alcohol abuse (6-8 beers/day). Warfarin recently initiated for his afib.   He presented to [facility] on 04/06/15 with complaints of dyspnea on exertion and chest tightness. This was also in the setting of recent medication noncompliance with lasix. In ED, he had an elevated troponin and what appeared to be a new LBBB.  General cardiology admitted and treated his acute CHF with IV Lasix. 2D echo showed EF of 20-25%. R/LHC was performed and showed nonobstructive CAD with elevated LVEDP and moderate pulmonary artery  hypertension. He is on BB therapy with Coreg. No ACE/ARB due to soft blood pressure/ hypotension. He has been noted to have short runs of NSVT however earlier today he had a long 30 beat run of VT on telemetry, for which EP has been consulted.   K is 3.4. Mg pending.     Home Medications  Prior to Admission medications   Medication Sig Start Date End Date Taking? Authorizing Provider  furosemide (LASIX) 40 MG tablet TAKE ONE TABLET BY MOUTH ONCE DAILY Patient taking differently: TAKE ONE TABLET BY MOUTH twice a day   Yes [name], MD  glucosamine-chondroitin 500-400 MG tablet Take 2 tablets by mouth daily.   Yes Historical Provider, MD  ibuprofen (ADVIL,MOTRIN) 200 MG tablet Take 200 mg by mouth every 6 (six) hours as needed for moderate pain.   Yes Historical Provider, MD  lisinopril (PRINIVIL,ZESTRIL) 20 MG tablet TAKE ONE TABLET BY MOUTH TWICE DAILY 05/18/13  Yes [name], MD  PARoxetine (PAXIL) 20 MG tablet Take 20 mg by mouth every morning.   Yes Historical Provider, MD  POTASSIUM BICARBONATE PO Take 2 tablets by mouth daily.   Yes Historical Provider, MD    Current facility-administered medications:  .  0.9 %  sodium chloride infusion, 250 mL, Intravenous, PRN, Eda Paschal Kilroy, PA-C .  0.9 %  sodium  chloride infusion, 250 mL, Intravenous, PRN, Corky Crafts, MD .  0.9 %  sodium chloride infusion, 250 mL, Intravenous, PRN, Corky Crafts, MD .  acetaminophen (TYLENOL) tablet 650 mg, 650 mg, Oral, Q4H PRN, Corky Crafts, MD .  ALPRAZolam Prudy Feeler) tablet 0.25 mg, 0.25 mg, Oral, TID PRN, Abelino Derrick, PA-C .  amiodarone (PACERONE) tablet 400 mg, 400 mg, Oral, 3 times per day, Brittainy M Simmons, PA-C, 400 mg at 04/09/15 1714 .  aspirin EC tablet 81 mg, 81 mg, Oral, Daily, Abelino Derrick, PA-C, 81 mg at 04/09/15 8295 .  atorvastatin (LIPITOR) tablet 80 mg, 80 mg, Oral, q1800, Abelino Derrick, PA-C, 80 mg at 04/08/15 1621 .  carvedilol (COREG) tablet 12.5 mg, 12.5  mg, Oral, BID WC, Luke K Kilroy, PA-C, 12.5 mg at 04/09/15 1715 .  Chlorhexidine Gluconate Cloth 2 % PADS 6 each, 6 each, Topical, Q0600, Chrystie Nose, MD, 6 each at 04/09/15 [phone] .  coumadin book, , Does not apply, Once, Faye Ramsay Rumbarger, RPH .  furosemide (LASIX) injection 40 mg, 40 mg, Intravenous, BID, Rhonda G Barrett, PA-C, 40 mg at 04/09/15 1223 .  heparin ADULT infusion 100 units/mL (25000 units/250 mL), 3,200 Units/hr, Intravenous, Continuous, Faye Ramsay Rumbarger, RPH, Last Rate: 32 mL/hr at 04/09/15 1439, 3,200 Units/hr at 04/09/15 1439 .  [COMPLETED] levalbuterol (XOPENEX) nebulizer solution 0.63 mg, 0.63 mg, Nebulization, Once, 0.63 mg at 04/06/15 1531 **FOLLOWED BY** levalbuterol (XOPENEX) nebulizer solution 0.63 mg, 0.63 mg, Nebulization, Q6H PRN, Purvis Sheffield, MD .  mupirocin ointment (BACTROBAN) 2 % 1 application, 1 application, Nasal, BID, Chrystie Nose, MD, 1 application at 04/09/15 [phone] .  nitroGLYCERIN (NITROSTAT) SL tablet 0.3 mg, 0.3 mg, Sublingual, Q5 min PRN, Purvis Sheffield, MD .  ondansetron [facility]) injection 4 mg, 4 mg, Intravenous, Q6H PRN, Corky Crafts, MD .  pantoprazole (PROTONIX) EC tablet 40 mg, 40 mg, Oral, Q0600, Abelino Derrick, PA-C, 40 mg at 04/09/15 0522 .  PARoxetine (PAXIL) tablet 20 mg, 20 mg, Oral, BH-q7a, Luke K Echo, PA-C, 20 mg at 04/09/15 7846 .  potassium chloride SA (K-DUR,KLOR-CON) CR tablet 40 mEq, 40 mEq, Oral, BID, Rhonda G Barrett, PA-C .  sodium chloride 0.9 % injection 3 mL, 3 mL, Intravenous, Q12H, [employer], PA-C, 3 mL at 04/09/15 1000 .  sodium chloride 0.9 % injection 3 mL, 3 mL, Intravenous, PRN, [employer], PA-C .  sodium chloride 0.9 % injection 3 mL, 3 mL, Intravenous, Q12H, Corky Crafts, MD .  sodium chloride 0.9 % injection 3 mL, 3 mL, Intravenous, PRN, Corky Crafts, MD .  sodium chloride 0.9 % injection 3 mL, 3 mL, Intravenous, Q12H, Corky Crafts, MD .  sodium chloride 0.9 % injection 3  mL, 3 mL, Intravenous, PRN, Corky Crafts, MD .  traMADol Janean Sark) tablet 50 mg, 50 mg, Oral, Q6H PRN, Abelino Derrick, PA-C, 50 mg at 04/08/15 2139 .  warfarin (COUMADIN) tablet 10 mg, 10 mg, Oral, ONCE-1800, Rachel L Rumbarger, RPH .  warfarin (COUMADIN) video, , Does not apply, Once, Rosaland Lao, RPH .  Warfarin - Pharmacist Dosing Inpatient, , Does not apply, q1800, Faye Ramsay Rumbarger, RPH .  zolpidem (AMBIEN) tablet 5 mg, 5 mg, Oral, QHS PRN,MR X 1, Luke K Kilroy, PA-C, 5 mg at 04/06/15 2031    Family History  Family History  Problem Relation Age of Onset  . Coronary artery disease      Social History  History   Social History  . Marital Status: Single    Spouse Name: N/A  . Number of Children: 2  . Years of Education: N/A   Occupational History  . scrap metal    Social History Main Topics  . Smoking status: Current Some Day Smoker -- 0.50 packs/day for 4 years    Types: Cigarettes  . Smokeless tobacco: Never Used  . Alcohol Use: 3.6 oz/week    6 Cans of beer per week  . Drug Use: Yes    Special: Codeine, Marijuana     Comment: has discontinued  . Sexual Activity: Not on file   Other Topics Concern  . Not on file   Social History Narrative     Review of Systems General:  No chills, fever, night sweats or weight changes.  Cardiovascular:  No chest pain, dyspnea on exertion, edema, orthopnea, palpitations, paroxysmal nocturnal dyspnea. Dermatological: No rash, lesions/masses Respiratory: No cough, dyspnea Urologic: No hematuria, dysuria Abdominal:   No nausea, vomiting, diarrhea, bright red blood per rectum, melena, or hematemesis Neurologic:  No visual changes, wkns, changes in mental status. All other systems reviewed and are otherwise negative except as noted above.  Physical Exam  Blood pressure 99/61, pulse 75, temperature 98 F (36.7 C), temperature source Oral, resp. rate 14, height 6\' 4"  (1.93 m), weight 285 lb 9.6 oz (129.547 kg), SpO2  98 %.  General: Pleasant, NAD Psych: Normal affect. Neuro: Alert and oriented X 3. Moves all extremities spontaneously. HEENT: Normal  Neck: Supple without bruits or JVD. Lungs:  Resp regular and unlabored, CTA. Heart: RRR no s3, s4, or murmurs. Abdomen: Soft, non-tender, non-distended, BS + x 4.  Extremities: No clubbing, cyanosis or edema. DP/PT/Radials 2+ and equal bilaterally.  Labs  Troponin (Point of Care Test) No results for input(s): TROPIPOC in the last 72 hours.  Recent Labs  04/06/15 1614 04/06/15 2129 04/07/15 0402  TROPONINI 0.39* 0.45* 0.37*   Lab Results  Component Value Date   WBC 5.1 04/09/2015   HGB 13.1 04/09/2015   HCT 38.2* 04/09/2015   MCV 92.7 04/09/2015   PLT 146* 04/09/2015    Recent Labs Lab 04/07/15 0402 04/09/15 0325  NA 135 137  K 3.6 3.4*  CL 99* 102  CO2 26 28  BUN 14 20  CREATININE 0.85 0.78  CALCIUM 8.6* 8.5*  PROT 6.0*  --   BILITOT 2.2*  --   ALKPHOS 75  --   ALT 20  --   AST 19  --   GLUCOSE 140* 118*   Lab Results  Component Value Date   CHOL  12/11/2007    136        ATP III CLASSIFICATION:  <200     mg/dL   Desirable  161-096  mg/dL   Borderline High  >=045    mg/dL   High   HDL 23* 40/98/1191   LDLCALC  12/11/2007    90        Total Cholesterol/HDL:CHD Risk Coronary Heart Disease Risk Table                     Men   Women  1/2 Average Risk   3.4   3.3   TRIG 115 12/11/2007   Lab Results  Component Value Date   DDIMER * 12/10/2007    0.59        AT THE INHOUSE ESTABLISHED CUTOFF VALUE OF 0.48 ug/mL FEU, THIS ASSAY HAS BEEN DOCUMENTED IN  THE LITERATURE TO HAVE     Radiology/Studies  Dg Chest 2 View  04/06/2015   CLINICAL DATA:  Shortness of breath since this morning.  EXAM: CHEST  2 VIEW  COMPARISON:  06/05/2011  FINDINGS: The patient has extensive bilateral interstitial pulmonary edema. Tiny right effusion. New cardiomegaly with pulmonary vascular congestion. No acute osseous abnormality. Chronic  accentuation of the thoracic kyphosis.  IMPRESSION: Prominent interstitial pulmonary edema. Tiny right effusion. Findings consistent with congestive heart failure.   Electronically Signed   By: Francene Boyers M.D.   On: 04/06/2015 12:53   Dg Chest Port 1 View  04/07/2015   CLINICAL DATA:  Acute onset of dyspnea.  Initial encounter.  EXAM: PORTABLE CHEST - 1 VIEW  COMPARISON:  Chest radiograph performed 04/06/2015  FINDINGS: Diffuse bilateral airspace opacification is noted, with mild sparing of the left lung apex, compatible with worsening pulmonary edema or pneumonia. Small bilateral pleural effusions are suspected. No pneumothorax is seen.  The cardiomediastinal silhouette is enlarged. No acute osseous abnormalities are identified.  IMPRESSION: Worsening diffuse bilateral airspace opacification, reflecting either worsening pulmonary edema or pneumonia. Suspect small bilateral pleural effusions. Cardiomegaly noted.   Electronically Signed   By: Roanna Raider M.D.   On: 04/07/2015 04:37    ECG  SR LBBB  Echocardiogram  Study Conclusions  - Left ventricle: The cavity size was severely dilated. There was mild concentric hypertrophy. Systolic function was severely reduced. The estimated ejection fraction was in the range of 15 to 20%. Diffuse hypokinesis. Doppler parameters are consistent with restrictive physiology, indicative of decreased left ventricular diastolic compliance and/or increased left atrial pressure. - Aortic valve: Trileaflet; normal thickness leaflets. There was no regurgitation. - Aortic root: The aortic root was normal in size. - Mitral valve: Structurally normal valve. There was mild regurgitation. - Left atrium: The atrium was severely dilated. - Right ventricle: The cavity size was moderately dilated. Wall thickness was normal. Systolic function was moderately reduced. - Right atrium: The atrium was moderately dilated. - Tricuspid valve: There was  mild regurgitation. - Pulmonary arteries: Systolic pressure was within the normal range. - Inferior vena cava: The vessel was normal in size. - Pericardium, extracardiac: There was no pericardial effusion.    ASSESSMENT AND PLAN  Principal Problem:   Acute on chronic systolic heart failure Active Problems:   Obesity   Essential hypertension   CAD- minor CAD 2009 and 2016   Tobacco use disorder- 1/2 pk a day   H/O ETOH abuse 6-8 beers a day   NICM- EF 25% 2012 & 20-25% 2016   Troponin level elevated   Atrial fibrillation with RVR - Paroxysmal  1. Poymorphic VT: 30 beat run of VT noted earlier on telemetry. In the setting of NICM with EF of 20-25%. Nonobstructive CAD on cath. Currently on BB therapy with Coreg 12.5 mg BID. Unable to tolerate higher doses of BB due to soft BP. QT interval not prolonged. He also has concomitant PAF. He will benefit from addition of PO amiodarone to suppress ectopy. Will start 400 mg PO Q8H. Recommend repletion of K to keep K~4.0. Mg lap pending. Keep Mg ~2.0. Plan  for CRT-D implant on Monday (has LBBB).   Nonischemic cardiomyopathy   Left bundle branch block   Congestive heart failure acute on chronic   Obstructive sleep apnea-treated    atrial fibrillation-paroxysmal   Hypokalemia  The patient has both polymorphic ventricular tachycardia and atrial fibrillation with a rapid rate. I don't see any drugs  implicate delayed afterdepolarizations's as the mechanism underlying and in the absence of ischemia, this is likely electromechanical and bodes poorly  I don't think the hypokalemia is probably sufficiently severe to explain the polymorphic VT; however, it should be repleted  However, he also has left bundle branch block and with his nonischemic myopathy we might anticipate next 60-70% of these patients responding to resynchronization.  Hence, I have recommended, given his long-standing left ventricular dysfunction,   that he undergo ICD  implantation with cardiac resynchronization  Have reviewed the potential benefits and risks of ICD implantation including but not limited to death, perforation of heart or lung, lead dislodgement, infection,  device malfunction and inappropriate shocks.  The patient express understanding  and are willing to proceed.    His blood pressure right now probably is too low to begin Entresto  However, given his syncopal ventricular tachycardia and his atrial fibrillation with a rapid rate we will begin amiodarone; we will have to watch for drug interactions  With his atrial fibrillation, he is also a candidate for digoxin. I would add his ACE inhibitor back in the morning; have written for this. I'm not sure why he needs to be on aspirin.        Signe systolicd, Robbie Lis, PA-C 04/09/2015, 3:28 PM

## 2015-04-09 NOTE — Progress Notes (Signed)
Pt had 11 beats of v tach while resting in bed. Pt asymptomatic. Md on call made aware. New order received. Will cont to monitor pt.

## 2015-04-09 NOTE — Progress Notes (Signed)
Patient Name: [name] [name] Date of Encounter: 04/09/2015  Principal Problem:   Acute on chronic systolic heart failure Active Problems:   Obesity   Essential hypertension   CAD- minor CAD 2009 and 2016   Tobacco use disorder- 1/2 pk a day   H/O ETOH abuse 6-8 beers a day   NICM- EF 25% 2012 & 20-25% 2016   Troponin level elevated   Atrial fibrillation with RVR - Paroxysmal   Primary Cardiologist: Dr Antoine Poche  Patient Profile: 54 yo male w/ hx NICM (EF 25% 2012), obesity, tob use, incomplete med compliance. Admitted 07/19 w/ CHF, chest pain. Had R/L cath 07/21w/ no sig CAD, elevated LVEDP at 34  SUBJECTIVE: SOB w/ minimal activity. Drinks > a six-pack of beer daily.   OBJECTIVE Filed Vitals:   04/09/15 0000 04/09/15 0500 04/09/15 0809 04/09/15 0855  BP: 95/46 102/64 97/62 100/69  Pulse: 62 78 82 85  Temp: 98 F (36.7 C) 97.9 F (36.6 C) 98 F (36.7 C)   TempSrc: Oral Oral Oral   Resp: Height:      Weight:  285 lb 9.6 oz (129.547 kg)    SpO2: 95% 96% 96% 95%    Intake/Output Summary (Last 24 hours) at 04/09/15 1130 Last data filed at 04/09/15 0810  Gross per 24 hour  Intake   1353 ml  Output    300 ml  Net   1053 ml   Filed Weights   04/07/15 0500 04/08/15 0400 04/09/15 0500  Weight: 284 lb (128.822 kg) 283 lb 11.2 oz (128.685 kg) 285 lb 9.6 oz (129.547 kg)    PHYSICAL EXAM General: Well developed, well nourished, male in no acute distress. Head: Normocephalic, atraumatic.  Neck: Supple without bruits, JVD 9 cm. Lungs:  Resp regular and unlabored, decreased BS bases w/ few rales. Heart: RRR, S1, S2, no S3, S4, soft murmur; no rub. Abdomen: Soft, non-tender, non-distended, BS + x 4.  Extremities: No clubbing, cyanosis, edema. R groin w/ minimal ecchymosis, no bruit. R radial w/ no ecchymosis or hematoma. Neuro: Alert and oriented X 3. Moves all extremities spontaneously. Psych: Normal affect.  LABS: CBC:  Recent Labs  04/08/15 0336  04/09/15 0325  WBC 5.7 5.1  HGB 13.0 13.1  HCT 38.4* 38.2*  MCV 93.7 92.7  PLT 133* 146*   INR:  Recent Labs  04/08/15 0336  INR 1.14   Basic Metabolic Panel:  Recent Labs  95/28/41 0402 04/08/15 0852 04/09/15 0325  NA 135  --  137  K 3.6  --  3.4*  CL 99*  --  102  CO2 26  --  28  GLUCOSE 140*  --  118*  BUN 14  --  20  CREATININE 0.85  --  0.78  CALCIUM 8.6*  --  8.5*  MG  --  1.9  --    Liver Function Tests:  Recent Labs  04/07/15 0402  AST 19  ALT 20  ALKPHOS 75  BILITOT 2.2*  PROT 6.0*  ALBUMIN 3.6   Cardiac Enzymes:  Recent Labs  04/06/15 1614 04/06/15 2129 04/07/15 0402  TROPONINI 0.39* 0.45* 0.37*    Recent Labs  04/06/15 1217  TROPIPOC 0.33*   BNP:  B NATRIURETIC PEPTIDE  Date/Time Value Ref Range Status  04/06/2015 12:05 PM 546.6* 0.0 - 100.0 pg/mL Final   Hemoglobin A1C:  Recent Labs  04/06/15 1614  HGBA1C 5.3   Thyroid Function Tests:  Recent Labs  04/06/15  1614  TSH 1.365   TELE: SR, PVCs and NSVT (short runs, rare), afib for several hours 07/21 early am    CATH: 04/08/2015  Nonobstructive coronary artery disease.  Elevated LVEDP  Moderate pulmonary artery hypertension.  Cardiac output 5.5 L/min; CI 2.1. Await echo results for LV function. Continue medical therapy.   ECHO: 04/08/2015  - Left ventricle: The cavity size was severely dilated. There was mild concentric hypertrophy. Systolic function was severely reduced. The estimated ejection fraction was in the range of 15 to 20%. Diffuse hypokinesis. Doppler parameters are consistent with restrictive physiology, indicative of decreased left ventricular diastolic compliance and/or increased left atrial pressure. - Aortic valve: Trileaflet; normal thickness leaflets. There was noregurgitation. - Aortic root: The aortic root was normal in size. - Mitral valve: Structurally normal valve. There was mildregurgitation. - Left atrium: The atrium was  severely dilated. - Right ventricle: The cavity size was moderately dilated. Wall thickness was normal. Systolic function was moderately reduced. - Right atrium: The atrium was moderately dilated. - Tricuspid valve: There was mild regurgitation. - Pulmonary arteries: Systolic pressure was within the normalrange. - Inferior vena cava: The vessel was normal in size. - Pericardium, extracardiac: There was no pericardial effusion.  Radiology/Studies: No results found.   Current Medications:  . aspirin EC  81 mg Oral Daily  . atorvastatin  80 mg Oral q1800  . carvedilol  12.5 mg Oral BID WC  . Chlorhexidine Gluconate Cloth  6 each Topical Q0600  . mupirocin ointment  1 application Nasal BID  . pantoprazole  40 mg Oral Q0600  . PARoxetine  20 mg Oral BH-q7a  . potassium chloride SA  20 mEq Oral Daily  . sodium chloride  3 mL Intravenous Q12H  . sodium chloride  3 mL Intravenous Q12H  . sodium chloride  3 mL Intravenous Q12H   . heparin 3,000 Units/hr (04/09/15 0516)    ASSESSMENT AND PLAN:   Acute on chronic systolic CHF - dry weight is unclear - LVEDP 34 and PAS 47 at cath 07/21 - resume IV Lasix at 40 mg IV BID  Active Problems:   Obesity - dietary changes    Essential hypertension - BP low at times on current rx    CAD- minor CAD 2009 & 2016 - CRF reduction    Tobacco use disorder- 1/2 pk a day - cessation reinforced    H/O ETOH abuse 6-8 beers a day - decrease/discontinue advised    NICM- EF 25% 2012 & 20-25% 2016 - renal function is OK, restart lisinopril at lower dose once off IV Lasix     Troponin level elevated - felt demand ischemia 2nd CHF    Atrial fibrillation with RVR - Paroxysmal - increase BB as BP/HR will tolerate. - with low EF, would not use Cardizem if possible    Hypokalemia - supp and follow    Anticoagulation - This patients CHA2DS2-VASc Score and unadjusted Ischemic Stroke Rate (% per year) is equal to 2.2 % stroke rate/year from a  score of 2 Above score calculated as 1 point each if present [CHF, HTN, DM, Vascular=MI/PAD/Aortic Plaque, Age if 65-74, or Male], 2 points each if present [Age > 75, or Stroke/TIA/TE].  - however, he has significant ETOH intake and compliance issues.  - at this time, feel risk outweighs benefit of anticoag.  Melida Quitter , PA-C 11:30 AM 04/09/2015

## 2015-04-09 NOTE — Progress Notes (Addendum)
ANTICOAGULATION CONSULT NOTE - Follow Up Consult  Pharmacy Consult for Heparin + warfarin Indication: atrial fibrillation  No Known Allergies  Patient Measurements: Height: 6\' 4"  (193 cm) Weight: 285 lb 9.6 oz (129.547 kg) IBW/kg (Calculated) : 86.8  Vital Signs: Temp: 98 F (36.7 C) (07/22 0809) Temp Source: Oral (07/22 0809) BP: 102/57 mmHg (07/22 1220) Pulse Rate: 75 (07/22 1220)  Labs:  Recent Labs  04/06/15 1614  04/06/15 2129  04/07/15 0402  04/08/15 0336 04/09/15 0325 04/09/15 1146  HGB  --   --   --   < > 14.8  --  13.0 13.1  --   HCT  --   --   --   --  43.0  --  38.4* 38.2*  --   PLT  --   --   --   --  168  --  133* 146*  --   LABPROT 14.1  --   --   --   --   --  14.8  --   --   INR 1.07  --   --   --   --   --  1.14  --   --   HEPARINUNFRC  --   < > <0.10*  --  0.11*  < > 0.32 0.41 0.18*  CREATININE  --   --   --   --  0.85  --   --  0.78  --   TROPONINI 0.39*  --  0.45*  --  0.37*  --   --   --   --   < > = values in this interval not displayed.  Estimated Creatinine Clearance: 158.7 mL/min (by C-G formula based on Cr of 0.78).  Assessment: 54 yom restarted on heparin gtt for afib after a cardiac cath yesterday afternoon. Also to start warfarin. Heparin level is now subtherapeutic and there have been no issues with the line per RN report. [place] INR is 1.14. No bleeding noted.   Goal of Therapy:  Heparin level 0.3-0.7 units/ml Monitor platelets by anticoagulation protocol: Yes   Plan:  - Increase heparin gtt to 3200 units/hr - Check an 8 hour heparin level - Warfarin 10mg  PO x 1 tonight - Daily INR, CBC and heparin level - Warfarin book + video to patient to initiate education  Rumbarger, Drake Leach 04/09/2015,1:12 PM  ___________________________________________________ Addendum: Heparin level therapeutic (0.51) after dose increase.  Goal of Therapy:  Heparin level 0.3-0.7 units/ml Monitor platelets by anticoagulation protocol: Yes    Plan:  - Continue heparin drip 3200 units/hr - Check AM heparin level to confirm dosing - Monitor daily heparin level, INR, CBC, and s/sx of bleeding  Waynette Buttery, PharmD Clinical Pharmacist Pager: [phone] 04/09/2015 10:12 PM

## 2015-04-10 LAB — CBC
HEMATOCRIT: 38.4 % — AB (ref 39.0–52.0)
Hemoglobin: 13.1 g/dL (ref 13.0–17.0)
MCH: 31.7 pg (ref 26.0–34.0)
MCHC: 34.1 g/dL (ref 30.0–36.0)
MCV: 93 fL (ref 78.0–100.0)
PLATELETS: 134 10*3/uL — AB (ref 150–400)
RBC: 4.13 MIL/uL — ABNORMAL LOW (ref 4.22–5.81)
RDW: 13.8 % (ref 11.5–15.5)
WBC: 4.8 10*3/uL (ref 4.0–10.5)

## 2015-04-10 LAB — BASIC METABOLIC PANEL
ANION GAP: 9 (ref 5–15)
BUN: 17 mg/dL (ref 6–20)
CALCIUM: 8.4 mg/dL — AB (ref 8.9–10.3)
CO2: 25 mmol/L (ref 22–32)
CREATININE: 0.76 mg/dL (ref 0.61–1.24)
Chloride: 102 mmol/L (ref 101–111)
GFR calc Af Amer: >60 mL/min (ref >60)
GLUCOSE: 121 mg/dL — AB (ref 65–99)
Potassium: 4 mmol/L (ref 3.5–5.1)
Sodium: 136 mmol/L (ref 135–145)

## 2015-04-10 LAB — HEPARIN LEVEL (UNFRACTIONATED): Heparin Unfractionated: 0.6 IU/mL (ref 0.30–0.70)

## 2015-04-10 LAB — PROTIME-INR
INR: 1.2 (ref 0.00–1.49)
Prothrombin Time: 15.3 seconds — ABNORMAL HIGH (ref 11.6–15.2)

## 2015-04-10 LAB — MAGNESIUM: Magnesium: 2.1 mg/dL (ref 1.7–2.4)

## 2015-04-10 MED ORDER — WARFARIN SODIUM 10 MG PO TABS
10.0000 mg | ORAL_TABLET | Freq: Once | ORAL | Status: AC
  Administered 2015-04-10: 10 mg via ORAL
  Filled 2015-04-10: qty 1

## 2015-04-10 MED ORDER — POTASSIUM CHLORIDE CRYS ER 20 MEQ PO TBCR
40.0000 meq | EXTENDED_RELEASE_TABLET | ORAL | Status: AC
  Administered 2015-04-10 (×2): 40 meq via ORAL
  Filled 2015-04-10 (×2): qty 2

## 2015-04-10 NOTE — Progress Notes (Signed)
Patient had 15 beats of Vtach.  Patient asymptomatic.  Dr. Zachery Conch, on call for cardiology on unit and was updated.  Order entered per Dr. Zachery Conch.

## 2015-04-10 NOTE — Progress Notes (Signed)
ANTICOAGULATION CONSULT NOTE - Follow Up Consult  Pharmacy Consult for Heparin  Indication: atrial fibrillation  No Known Allergies  Patient Measurements: Height: 6\' 4"  (193 cm) Weight: 283 lb 14.4 oz (128.776 kg) IBW/kg (Calculated) : 86.8  Vital Signs: Temp: 97.6 F (36.4 C) (07/23 0430) Temp Source: Oral (07/23 0430) BP: 107/67 mmHg (07/23 0430) Pulse Rate: 69 (07/23 0430)  Labs:  Recent Labs  04/08/15 0336 04/09/15 0325 04/09/15 1146 04/09/15 2121 04/10/15 0407  HGB 13.0 13.1  --   --  13.1  HCT 38.4* 38.2*  --   --  38.4*  PLT 133* 146*  --   --  134*  LABPROT 14.8  --   --   --  15.3*  INR 1.14  --   --   --  1.20  HEPARINUNFRC 0.32 0.41 0.18* 0.51 0.60  CREATININE  --  0.78  --   --  0.76    Estimated Creatinine Clearance: 158.3 mL/min (by C-G formula based on Cr of 0.76).   Assessment: Therapeutic heparin level x 2  Goal of Therapy:  Heparin level 0.3-0.7 units/ml Monitor platelets by anticoagulation protocol: Yes   Plan:  -Continue heparin at 3200 units/hr -Daily CBC/HL -Monitor for bleeding -F/U plans for long term anti-coagulation (likely needed per provider note)  Abran Duke 04/10/2015,6:56 AM

## 2015-04-10 NOTE — Discharge Instructions (Addendum)
Information on my medicine - Coumadin   (Warfarin)  This medication education was reviewed with me or my healthcare representative as part of my discharge preparation.  The pharmacist that spoke with me during my hospital stay was:  Reuel Derby, [facility]  Why was Coumadin prescribed for you? Coumadin was prescribed for you because you have a blood clot or a medical condition that can cause an increased risk of forming blood clots. Blood clots can cause serious health problems by blocking the flow of blood to the heart, lung, or brain. Coumadin can prevent harmful blood clots from forming. As a reminder your indication for Coumadin is:   Stroke Prevention Because Of Atrial Fibrillation  What test will check on my response to Coumadin? While on Coumadin (warfarin) you will need to have an INR test regularly to ensure that your dose is keeping you in the desired range. The INR (international normalized ratio) number is calculated from the result of the laboratory test called prothrombin time (PT).  If an INR APPOINTMENT HAS NOT ALREADY BEEN MADE FOR YOU please schedule an appointment to have this lab work done by your health care provider within 7 days. Your INR goal is usually a number between:  2 to 3 or your provider may give you a more narrow range like 2-2.5.  Ask your health care provider during an office visit what your goal INR is.  What  do you need to  know  About  COUMADIN? Take Coumadin (warfarin) exactly as prescribed by your healthcare provider about the same time each day.  DO NOT stop taking without talking to the doctor who prescribed the medication.  Stopping without other blood clot prevention medication to take the place of Coumadin may increase your risk of developing a new clot or stroke.  Get refills before you run out.  What do you do if you miss a dose? If you miss a dose, take it as soon as you remember on the same day then continue your regularly scheduled regimen the next  day.  Do not take two doses of Coumadin at the same time.  Important Safety Information A possible side effect of Coumadin (Warfarin) is an increased risk of bleeding. You should call your healthcare provider right away if you experience any of the following: ? Bleeding from an injury or your nose that does not stop. ? Unusual colored urine (red or dark brown) or unusual colored stools (red or black). ? Unusual bruising for unknown reasons. ? A serious fall or if you hit your head (even if there is no bleeding).  Some foods or medicines interact with Coumadin (warfarin) and might alter your response to warfarin. To help avoid this: ? Eat a balanced diet, maintaining a consistent amount of Vitamin K. ? Notify your provider about major diet changes you plan to make. ? Avoid alcohol or limit your intake to 1 drink for women and 2 drinks for men per day. (1 drink is 5 oz. wine, 12 oz. beer, or 1.5 oz. liquor.)  Make sure that ANY health care provider who prescribes medication for you knows that you are taking Coumadin (warfarin).  Also make sure the healthcare provider who is monitoring your Coumadin knows when you have started a new medication including herbals and non-prescription products.  Coumadin (Warfarin)  Major Drug Interactions  Increased Warfarin Effect Decreased Warfarin Effect  Alcohol (large quantities) Antibiotics (esp. Septra/Bactrim, Flagyl, Cipro) Amiodarone (Cordarone) Aspirin (ASA) Cimetidine (Tagamet) Megestrol (Megace) NSAIDs (ibuprofen,  naproxen, etc.) Piroxicam (Feldene) Propafenone (Rythmol SR) Propranolol (Inderal) Isoniazid (INH) Posaconazole (Noxafil) Barbiturates (Phenobarbital) Carbamazepine (Tegretol) Chlordiazepoxide (Librium) Cholestyramine (Questran) Griseofulvin Oral Contraceptives Rifampin Sucralfate (Carafate) Vitamin K   Coumadin (Warfarin) Major Herbal Interactions  Increased Warfarin Effect Decreased Warfarin Effect   Garlic Ginseng Ginkgo biloba Coenzyme Q10 Green tea St. Johns wort    Coumadin (Warfarin) FOOD Interactions  Eat a consistent number of servings per week of foods HIGH in Vitamin K (1 serving =  cup)  Collards (cooked, or boiled & drained) Kale (cooked, or boiled & drained) Mustard greens (cooked, or boiled & drained) Parsley *serving size only =  cup Spinach (cooked, or boiled & drained) Swiss chard (cooked, or boiled & drained) Turnip greens (cooked, or boiled & drained)  Eat a consistent number of servings per week of foods MEDIUM-HIGH in Vitamin K (1 serving = 1 cup)  Asparagus (cooked, or boiled & drained) Broccoli (cooked, boiled & drained, or raw & chopped) Brussel sprouts (cooked, or boiled & drained) *serving size only =  cup Lettuce, raw (green leaf, endive, romaine) Spinach, raw Turnip greens, raw & chopped   These websites have more information on Coumadin (warfarin):  http://www.king-russell.com/; https://www.hines.net/;     Radial Site Care Refer to this sheet in the next few weeks. These instructions provide you with information on caring for yourself after your procedure. Your caregiver may also give you more specific instructions. Your treatment has been planned according to current medical practices, but problems sometimes occur. Call your caregiver if you have any problems or questions after your procedure. HOME CARE INSTRUCTIONS  You may shower the day after the procedure.Remove the bandage (dressing) and gently wash the site with plain soap and water.Gently pat the site dry.  Do not apply powder or lotion to the site.  Do not submerge the affected site in water for 3 to 5 days.  Inspect the site at least twice daily.  Do not flex or bend the affected arm for 24 hours.  No lifting over 5 pounds (2.3 kg) for 5 days after your procedure.  Do not drive home if you are discharged the same day of the procedure. Have someone else drive you.  You  may drive 24 hours after the procedure unless otherwise instructed by your caregiver.  Do not operate machinery or power tools for 24 hours.  A responsible adult should be with you for the first 24 hours after you arrive home. What to expect:  Any bruising will usually fade within 1 to 2 weeks.  Blood that collects in the tissue (hematoma) may be painful to the touch. It should usually decrease in size and tenderness within 1 to 2 weeks. SEEK IMMEDIATE MEDICAL CARE IF:  You have unusual pain at the radial site.  You have redness, warmth, swelling, or pain at the radial site.  You have drainage (other than a small amount of blood on the dressing).  You have chills.  You have a fever or persistent symptoms for more than 72 hours.  You have a fever and your symptoms suddenly get worse.  Your arm becomes pale, cool, tingly, or numb.  You have heavy bleeding from the site. Hold pressure on the site. Document Released: 10/07/2010 Document Revised: 11/27/2011 Document Reviewed: 10/07/2010 [facility] Patient Information 2015 [place], [place]. This information is not intended to replace advice given to you by your health care provider. Make sure you discuss any questions you have with your health care provider.  Supplemental Discharge Instructions for  Pacemaker/Defibrillator Patients  Activity No heavy lifting or vigorous activity with your left arm for 6 to 8 weeks.  Do not raise your left arm above your head for one week.  Gradually raise your affected arm as drawn below.           __  04/14/25                           04/17/15                   04/19/15                     04/21/15   NO DRIVING for 1 week   ; you may begin driving 05[birth year]49.  WOUND CARE - Keep the wound area clean and dry.  Do not get this area wet for one week. No showers for one week; you may shower on 04/20/15    . - The tape/steri-strips on your wound will fall off; do not pull them off.  No bandage is needed on  the site.  DO  NOT apply any creams, oils, or ointments to the wound area. - If you notice any drainage or discharge from the wound, any swelling or bruising at the site, or you develop a fever > 101? F after you are discharged home, call the office at once.  Special Instructions - You are still able to use cellular telephones; use the ear opposite the side where you have your pacemaker/defibrillator.  Avoid carrying your cellular phone near your device. - When traveling through airports, show security personnel your identification card to avoid being screened in the metal detectors.  Ask the security personnel to use the hand wand. - Avoid arc welding equipment, MRI testing (magnetic resonance imaging), TENS units (transcutaneous nerve stimulators).  Call the office for questions about other devices. - Avoid electrical appliances that are in poor condition or are not properly grounded. - Microwave ovens are safe to be near or to operate.  Additional information for defibrillator patients should your device go off: - If your device goes off ONCE and you feel fine afterward, notify the device clinic nurses. - If your device goes off ONCE and you do not feel well afterward, call 911. - If your device goes off TWICE, call 911. - If your device goes off THREE times in one day, call 911.  DO NOT DRIVE YOURSELF OR A FAMILY MEMBER WITH A DEFIBRILLATOR TO THE HOSPITAL--CALL 911.  Heart healthy Diet  Weigh daily Call [phone] if weight climbs more than 3 pounds in a day or 5 pounds in a week. No salt to very little salt in your diet.  No more than 2000 mg in a day. Call if increased shortness of breath or increased swelling.  No work for 1 month.  For your coumadin/warfarin  You will take 10 mg today and tomorrow only then beginning SAT take 10 mg daily and 2.5 mg tablet every other day IE:  SAT 12.5 mg, Sunday 10 mg , Monday 12.5 mg...  STop smoking   Stop alcohol- this will cause the  coumadin to work stronger and may cause bleeding.

## 2015-04-10 NOTE — Progress Notes (Signed)
Subjective:  Still fatigued.  Mild shortness of breath.  Objective:  Vital Signs in the last 24 hours: BP 105/56 mmHg  Pulse 69  Temp(Src) 98.7 F (37.1 C) (Oral)  Resp 14  Ht 6\' 4"  (1.93 m)  Wt 128.776 kg (283 lb 14.4 oz)  BMI 34.57 kg/m2  SpO2 94%  Physical Exam: Somewhat disheveled white male mildly obese in no acute distress Lungs:  Reduced breath sounds at bases Cardiac:  Regular rhythm, normal S1 and S2, no S3 Extremities:  No edema present  Intake/Output from previous day: 07/22 0701 - 07/23 0700 In: 1700.9 [P.O.:1320; I.V.:380.9] Out: 1525 [Urine:1525] Weight Filed Weights   04/08/15 0400 04/09/15 0500 04/10/15 0500  Weight: 128.685 kg (283 lb 11.2 oz) 129.547 kg (285 lb 9.6 oz) 128.776 kg (283 lb 14.4 oz)    Lab Results: Basic Metabolic Panel:  Recent Labs  41/32/44 0325 04/10/15 0407  NA 137 136  K 3.4* 4.0  CL 102 102  CO2 28 25  GLUCOSE 118* 121*  BUN 20 17  CREATININE 0.78 0.76    CBC:  Recent Labs  04/09/15 0325 04/10/15 0407  WBC 5.1 4.8  HGB 13.1 13.1  HCT 38.2* 38.4*  MCV 92.7 93.0  PLT 146* 134*    BNP    Component Value Date/Time   BNP 546.6* 04/06/2015 1205    PROTIME: Lab Results  Component Value Date   INR 1.20 04/10/2015   INR 1.14 04/08/2015   INR 1.07 04/06/2015    Telemetry: Currently sinus rhythm.  Some episodes of V. tach.  He received potassium supplementation last night with reduction.  Assessment/Plan:  1.  Nonischemic cardiomyopathy 2.  Paroxysmal atrial fibrillation 3.  Ventricular tachycardia improved  Recommendations:  Dr. Odessa Fleming note read.  Plan is to have a defibrillator implantation.  Continue potassium supplementation.      Darden Palmer  MD [facility]  04/10/2015, 11:48 AM

## 2015-04-10 NOTE — Progress Notes (Signed)
ANTICOAGULATION CONSULT NOTE - Follow Up Consult  Pharmacy Consult for Heparin/Warfarin  Indication: atrial fibrillation  No Known Allergies  Patient Measurements: Height: 6\' 4"  (193 cm) Weight: 283 lb 14.4 oz (128.776 kg) IBW/kg (Calculated) : 86.8  Vital Signs: Temp: 97.6 F (36.4 C) (07/23 0430) Temp Source: Oral (07/23 0430) BP: 107/67 mmHg (07/23 0430) Pulse Rate: 69 (07/23 0430)  Labs:  Recent Labs  04/08/15 0336 04/09/15 0325 04/09/15 1146 04/09/15 2121 04/10/15 0407  HGB 13.0 13.1  --   --  13.1  HCT 38.4* 38.2*  --   --  38.4*  PLT 133* 146*  --   --  134*  LABPROT 14.8  --   --   --  15.3*  INR 1.14  --   --   --  1.20  HEPARINUNFRC 0.32 0.41 0.18* 0.51 0.60  CREATININE  --  0.78  --   --  0.76    Estimated Creatinine Clearance: 158.3 mL/min (by C-G formula based on Cr of 0.76).   Assessment: Pt on warfarin/heparin for Afib. Past two heparin levels within therapeutic range (0.51, 0.60) with warfarin initiated 7/22 at 10mg  PO. Current INR 1.2. H/H stable with no reported bleeding.   Goal of Therapy:  INR 2.0-3.0 Heparin level 0.3-0.7 units/ml Monitor platelets by anticoagulation protocol: Yes   Plan:  -Continue heparin at 3200 units/hr -Warfarin 10 PO x1 tonight -Daily CBC, HL, INR -Monitor for ssx bleeding   Reuel Derby, PharmD. 04/10/2015,7:46 AM

## 2015-04-11 LAB — BASIC METABOLIC PANEL
ANION GAP: 7 (ref 5–15)
BUN: 16 mg/dL (ref 6–20)
CO2: 28 mmol/L (ref 22–32)
Calcium: 8.8 mg/dL — ABNORMAL LOW (ref 8.9–10.3)
Chloride: 101 mmol/L (ref 101–111)
Creatinine, Ser: 0.85 mg/dL (ref 0.61–1.24)
GFR calc Af Amer: >60 mL/min (ref >60)
Glucose, Bld: 122 mg/dL — ABNORMAL HIGH (ref 65–99)
POTASSIUM: 4.2 mmol/L (ref 3.5–5.1)
SODIUM: 136 mmol/L (ref 135–145)

## 2015-04-11 LAB — CBC
HEMATOCRIT: 39.3 % (ref 39.0–52.0)
HEMOGLOBIN: 13.5 g/dL (ref 13.0–17.0)
MCH: 32.1 pg (ref 26.0–34.0)
MCHC: 34.4 g/dL (ref 30.0–36.0)
MCV: 93.3 fL (ref 78.0–100.0)
PLATELETS: 149 10*3/uL — AB (ref 150–400)
RBC: 4.21 MIL/uL — ABNORMAL LOW (ref 4.22–5.81)
RDW: 13.9 % (ref 11.5–15.5)
WBC: 4.5 10*3/uL (ref 4.0–10.5)

## 2015-04-11 LAB — PROTIME-INR
INR: 1.13 (ref 0.00–1.49)
Prothrombin Time: 14.7 seconds (ref 11.6–15.2)

## 2015-04-11 LAB — HEPARIN LEVEL (UNFRACTIONATED): Heparin Unfractionated: 0.63 IU/mL (ref 0.30–0.70)

## 2015-04-11 LAB — CLOSTRIDIUM DIFFICILE BY PCR: Toxigenic C. Difficile by PCR: NEGATIVE

## 2015-04-11 MED ORDER — DEXTROSE 5 % IV SOLN
3.0000 g | INTRAVENOUS | Status: AC
  Filled 2015-04-11: qty 3000

## 2015-04-11 MED ORDER — CHLORHEXIDINE GLUCONATE 4 % EX LIQD
CUTANEOUS | Status: AC
  Administered 2015-04-11
  Filled 2015-04-11: qty 15

## 2015-04-11 MED ORDER — SODIUM CHLORIDE 0.9 % IV SOLN
INTRAVENOUS | Status: DC
  Administered 2015-04-12: 50 mL via INTRAVENOUS
  Administered 2015-04-14: 06:00:00 via INTRAVENOUS

## 2015-04-11 MED ORDER — WARFARIN SODIUM 2.5 MG PO TABS
12.5000 mg | ORAL_TABLET | Freq: Once | ORAL | Status: AC
  Administered 2015-04-11: 12.5 mg via ORAL
  Filled 2015-04-11 (×2): qty 1

## 2015-04-11 MED ORDER — GENTAMICIN SULFATE 40 MG/ML IJ SOLN
80.0000 mg | INTRAMUSCULAR | Status: AC
  Administered 2015-04-14: 80 mg

## 2015-04-11 NOTE — Progress Notes (Signed)
ANTICOAGULATION CONSULT NOTE - Follow Up Consult  Pharmacy Consult for Heparin/Warfarin  Indication: atrial fibrillation  No Known Allergies  Patient Measurements: Height: 6\' 4"  (193 cm) Weight: 281 lb 14.4 oz (127.869 kg) IBW/kg (Calculated) : 86.8  Vital Signs: Temp: 98.1 F (36.7 C) (07/24 0400) BP: 103/62 mmHg (07/24 0400) Pulse Rate: 64 (07/24 0400)  Labs:  Recent Labs  04/09/15 0325  04/09/15 2121 04/10/15 0407 04/11/15 0405  HGB 13.1  --   --  13.1 13.5  HCT 38.2*  --   --  38.4* 39.3  PLT 146*  --   --  134* 149*  LABPROT  --   --   --  15.3* 14.7  INR  --   --   --  1.20 1.13  HEPARINUNFRC 0.41  < > 0.51 0.60 0.63  CREATININE 0.78  --   --  0.76 0.85  < > = values in this interval not displayed.  Estimated Creatinine Clearance: 148.4 mL/min (by C-G formula based on Cr of 0.85).   Assessment: Pt on warfarin/heparin for Afib. Past two heparin levels within therapeutic range (0.60, 0.63) with warfarin initiated 7/22 at 10mg  PO. Day #2 warfarin therapy. Current INR 1.2>1.13. CBC stable with no reported bleeding.   Goal of Therapy:  INR 2.0-3.0 Heparin level 0.3-0.7 units/ml Monitor platelets by anticoagulation protocol: Yes   Plan:  -Continue heparin at 3200 units/hr -Increase warfarin to 12.5mg  PO x1 tonight -Daily CBC, HL, INR -Monitor for ssx bleeding   Reuel Derby, PharmD. 04/11/2015,7:19 AM

## 2015-04-11 NOTE — Progress Notes (Signed)
Subjective:  Family and room.  Says breathing is better.  Still having runs of nonsustained ventricular tachycardia.  Potassium has been repleted.  Have AICD inserted tomorrow.  Objective:  Vital Signs in the last 24 hours: BP 113/71 mmHg  Pulse 78  Temp(Src) 98 F (36.7 C) (Oral)  Resp 18  Ht 6\' 4"  (1.93 m)  Wt 127.869 kg (281 lb 14.4 oz)  BMI 34.33 kg/m2  SpO2 98%  Physical Exam: Somewhat disheveled white male mildly obese in no acute distress Lungs:  Reduced breath sounds at bases  Cardiac:  Regular rhythm, normal S1 and S2, no S3 Extremities:  No edema present  Intake/Output from previous day: 07/23 0701 - 07/24 0700 In: 864 [P.O.:480; I.V.:384] Out: 1625 [Urine:1625] Weight Filed Weights   04/09/15 0500 04/10/15 0500 04/11/15 0400  Weight: 129.547 kg (285 lb 9.6 oz) 128.776 kg (283 lb 14.4 oz) 127.869 kg (281 lb 14.4 oz)    Lab Results: Basic Metabolic Panel:  Recent Labs  09/32/35 0407 04/11/15 0405  NA 136 136  K 4.0 4.2  CL 102 101  CO2 25 28  GLUCOSE 121* 122*  BUN 17 16  CREATININE 0.76 0.85    CBC:  Recent Labs  04/10/15 0407 04/11/15 0405  WBC 4.8 4.5  HGB 13.1 13.5  HCT 38.4* 39.3  MCV 93.0 93.3  PLT 134* 149*    BNP    Component Value Date/Time   BNP 546.6* 04/06/2015 1205    PROTIME: Lab Results  Component Value Date   INR 1.13 04/11/2015   INR 1.20 04/10/2015   INR 1.14 04/08/2015    Telemetry: Currently sinus rhythm.  Some episodes of V. tach.  He received potassium supplementation last night with reduction.  Assessment/Plan:  1.  Nonischemic cardiomyopathy 2.  Paroxysmal atrial fibrillation 3.  Ventricular tachycardia  4.  Hypokalemia repleted  Recommendations:  For defibrillator implantation tomorrow.  Continue potassium supplementation.  Continue to monitor ventricular tachycardia.  Volume status appears to be reasonable at this time.      Darden Palmer  MD [facility]  04/11/2015, 11:17 AM

## 2015-04-12 LAB — CBC
HCT: 38.3 % — ABNORMAL LOW (ref 39.0–52.0)
HEMOGLOBIN: 13.2 g/dL (ref 13.0–17.0)
MCH: 32.2 pg (ref 26.0–34.0)
MCHC: 34.5 g/dL (ref 30.0–36.0)
MCV: 93.4 fL (ref 78.0–100.0)
Platelets: 140 10*3/uL — ABNORMAL LOW (ref 150–400)
RBC: 4.1 MIL/uL — ABNORMAL LOW (ref 4.22–5.81)
RDW: 13.7 % (ref 11.5–15.5)
WBC: 4.8 10*3/uL (ref 4.0–10.5)

## 2015-04-12 LAB — PROTIME-INR
INR: 1.28 (ref 0.00–1.49)
PROTHROMBIN TIME: 16.2 s — AB (ref 11.6–15.2)

## 2015-04-12 LAB — HEPARIN LEVEL (UNFRACTIONATED): Heparin Unfractionated: 0.45 IU/mL (ref 0.30–0.70)

## 2015-04-12 LAB — GLUCOSE, CAPILLARY: GLUCOSE-CAPILLARY: 108 mg/dL — AB (ref 65–99)

## 2015-04-12 MED ORDER — FUROSEMIDE 10 MG/ML IJ SOLN
80.0000 mg | Freq: Two times a day (BID) | INTRAMUSCULAR | Status: DC
  Administered 2015-04-13: 80 mg via INTRAVENOUS
  Filled 2015-04-12 (×2): qty 8

## 2015-04-12 MED ORDER — WARFARIN SODIUM 2.5 MG PO TABS
12.5000 mg | ORAL_TABLET | Freq: Once | ORAL | Status: AC
  Administered 2015-04-12: 12.5 mg via ORAL
  Filled 2015-04-12 (×2): qty 1

## 2015-04-12 MED ORDER — ALPRAZOLAM 0.5 MG PO TABS
0.5000 mg | ORAL_TABLET | Freq: Three times a day (TID) | ORAL | Status: DC | PRN
  Administered 2015-04-12 – 2015-04-14 (×4): 0.5 mg via ORAL
  Filled 2015-04-12 (×4): qty 1

## 2015-04-12 MED ORDER — METOLAZONE 5 MG PO TABS
5.0000 mg | ORAL_TABLET | Freq: Once | ORAL | Status: AC
  Administered 2015-04-12: 5 mg via ORAL
  Filled 2015-04-12: qty 1

## 2015-04-12 NOTE — Progress Notes (Signed)
ANTICOAGULATION CONSULT NOTE - Follow Up Consult  Pharmacy Consult for Heparin/Warfarin  Indication: atrial fibrillation  No Known Allergies  Patient Measurements: Height: 6\' 4"  (193 cm) Weight: 282 lb 1.6 oz (127.96 kg) IBW/kg (Calculated) : 86.8  Vital Signs: Temp: 97.7 F (36.5 C) (07/25 0800) Temp Source: Oral (07/25 0800) BP: 118/77 mmHg (07/25 0844) Pulse Rate: 84 (07/25 0807)  Labs:  Recent Labs  04/10/15 0407 04/11/15 0405 04/12/15 0251  HGB 13.1 13.5 13.2  HCT 38.4* 39.3 38.3*  PLT 134* 149* 140*  LABPROT 15.3* 14.7 16.2*  INR 1.20 1.13 1.28  HEPARINUNFRC 0.60 0.63 0.45  CREATININE 0.76 0.85  --     Estimated Creatinine Clearance: 148.5 mL/min (by C-G formula based on Cr of 0.85).   Assessment: Pt on warfarin/heparin for Afib. Heparin is therapeutic, INR 1.2>1.2. Heparin gtt is currently off as pt is about to receive ICD. CBC stable with no reported bleeding. May consider transitioning to Lovenox for bridge when patient is ready for discharge.   Goal of Therapy:  INR 2.0-3.0 Heparin level 0.3-0.7 units/ml Monitor platelets by anticoagulation protocol: Yes   Plan:  -Continue heparin at 3200 units/hr -Warfarin 12.5mg  po x1 -Daily CBC, HL, INR -Monitor for s/sx bleeding   Towanna Avery, Darl Householder, PharmD. 04/12/2015,9:30 AM

## 2015-04-12 NOTE — Progress Notes (Addendum)
Subjective:  Called to see this am after pt became diaphoretic per RN. No VT on telelmetry (NSR), B/P and blood sugar stable. He feels better now.   Objective:  Vital Signs in the last 24 hours: Temp:  [97.7 F (36.5 C)-98.9 F (37.2 C)] 97.7 F (36.5 C) (07/25 0800) Pulse Rate:  [65-84] 84 (07/25 0807) Resp:  [19-22] 20 (07/25 0400) BP: (91-118)/(59-77) 118/77 mmHg (07/25 0844) SpO2:  [94 %-100 %] 100 % (07/25 0844) Weight:  [282 lb 1.6 oz (127.96 kg)] 282 lb 1.6 oz (127.96 kg) (07/25 0400)  Intake/Output from previous day:  Intake/Output Summary (Last 24 hours) at 04/12/15 0934 Last data filed at 04/12/15 0700  Gross per 24 hour  Intake 1051.73 ml  Output   1200 ml  Net -148.27 ml    Physical Exam: General appearance: alert, cooperative, no distress, moderately obese and on O2 Lungs: decreased breath sounds, no rales Heart: regular rate and rhythm Extremities: no edema JVP 7-8 CM barrell chested  Abd soft +HJR Skin warm and dry    Rate: 78  Rhythm: normal sinus rhythm  Lab Results:  Recent Labs  04/11/15 0405 04/12/15 0251  WBC 4.5 4.8  HGB 13.5 13.2  PLT 149* 140*    Recent Labs  04/10/15 0407 04/11/15 0405  NA 136 136  K 4.0 4.2  CL 102 101  CO2 25 28  GLUCOSE 121* 122*  BUN 17 16  CREATININE 0.76 0.85   No results for input(s): TROPONINI in the last 72 hours.  Invalid input(s): CK, MB  Recent Labs  04/12/15 0251  INR 1.28    Scheduled Meds: . amiodarone  400 mg Oral 3 times per day  . aspirin EC  81 mg Oral Daily  . atorvastatin  80 mg Oral q1800  . carvedilol  12.5 mg Oral BID WC  .  ceFAZolin (ANCEF) IV  3 g Intravenous To SSTC  . Chlorhexidine Gluconate Cloth  6 each Topical Q0600  . furosemide  40 mg Intravenous BID  . gentamicin irrigation  80 mg Irrigation To SSTC  . lisinopril  2.5 mg Oral Daily  . mupirocin ointment  1 application Nasal BID  . pantoprazole  40 mg Oral Q0600  . PARoxetine  20 mg Oral BH-q7a  .  potassium chloride SA  40 mEq Oral BID  . sodium chloride  3 mL Intravenous Q12H  . warfarin  12.5 mg Oral ONCE-1800  . Warfarin - Pharmacist Dosing Inpatient   Does not apply q1800   Continuous Infusions: . sodium chloride 50 mL (04/12/15 0526)  . heparin Stopped (04/12/15 0759)   PRN Meds:.sodium chloride, acetaminophen, ALPRAZolam, [COMPLETED] levalbuterol **FOLLOWED BY** levalbuterol, nitroGLYCERIN, ondansetron (ZOFRAN) IV, sodium chloride, traMADol, zolpidem   Imaging: Imaging results have been reviewed  Cardiac Studies:  Assessment/Plan:  54 y/o Caucasian male, single, smokes 1/2 pk a day and drinks 6-8 beers a day, works in heating and air. He was followed in the past by Dr Antoine Poche for NICM, He was admitted 7/19 with acute CHF and new LBBB.  EF 15%.  Cath 7/21- non obstructive CAD. He has had AF with RVR and NSVT since admission.  He is for BiV ICD today.   Principal Problem:   Acute on chronic systolic heart failure Active Problems:   NICM- EF 25% 2012 & 20-25% 2016   Troponin level elevated   Essential hypertension   CAD- minor CAD 2009 and 2016   Atrial fibrillation with RVR - Paroxysmal  Obesity   Tobacco use disorder- 1/2 pk a day   H/O ETOH abuse 6-8 beers a day   PLAN: Bi V ICD today. Amiodarone added. Continue IV BID Lasix. Suspect he will need long term anticoagulation with PAF and severe LVD, currently Heparin on hold for ICD.   [name] Shelter PA-C 04/12/2015, 9:34 AM [phone]  The patient on examination continues to be quite short of breath. His JVP is not particularly striking; this is true not withstanding his LVEDP of 34 and with the episode this morning, I am reluctant to proceed with CRT-D implantation. I've asked the heart failure team to consult and see if we can improve his functional status prior to device implantation  i will increase his IV lasix  He is tentatively rescheduled to Wednesday afternoon

## 2015-04-12 NOTE — Progress Notes (Signed)
Patient had 12 beats V-tach, followed by another 3 beats of V-tach. Pt asymptomatic. Will continue to Monitor.

## 2015-04-12 NOTE — Progress Notes (Addendum)
Patient complaints of "im very anxious". Pt request for anxiety medication. MD notified. 0.5 mg xanax given. Patient relaxed and comfortable in bed. Will continue to monitor.

## 2015-04-13 DIAGNOSIS — I480 Paroxysmal atrial fibrillation: Secondary | ICD-10-CM

## 2015-04-13 DIAGNOSIS — I429 Cardiomyopathy, unspecified: Secondary | ICD-10-CM

## 2015-04-13 LAB — CBC
HCT: 40.8 % (ref 39.0–52.0)
HEMOGLOBIN: 14 g/dL (ref 13.0–17.0)
MCH: 32 pg (ref 26.0–34.0)
MCHC: 34.3 g/dL (ref 30.0–36.0)
MCV: 93.2 fL (ref 78.0–100.0)
PLATELETS: 159 10*3/uL (ref 150–400)
RBC: 4.38 MIL/uL (ref 4.22–5.81)
RDW: 13.7 % (ref 11.5–15.5)
WBC: 5.9 10*3/uL (ref 4.0–10.5)

## 2015-04-13 LAB — BRAIN NATRIURETIC PEPTIDE: B NATRIURETIC PEPTIDE 5: 571.6 pg/mL — AB (ref 0.0–100.0)

## 2015-04-13 LAB — HEPARIN LEVEL (UNFRACTIONATED)
Heparin Unfractionated: 0.23 IU/mL — ABNORMAL LOW (ref 0.30–0.70)
Heparin Unfractionated: 0.64 IU/mL (ref 0.30–0.70)

## 2015-04-13 LAB — BASIC METABOLIC PANEL
Anion gap: 12 (ref 5–15)
BUN: 16 mg/dL (ref 6–20)
CO2: 29 mmol/L (ref 22–32)
Calcium: 9.5 mg/dL (ref 8.9–10.3)
Chloride: 93 mmol/L — ABNORMAL LOW (ref 101–111)
Creatinine, Ser: 0.98 mg/dL (ref 0.61–1.24)
GFR calc Af Amer: >60 mL/min (ref >60)
Glucose, Bld: 176 mg/dL — ABNORMAL HIGH (ref 65–99)
Potassium: 4.3 mmol/L (ref 3.5–5.1)
SODIUM: 134 mmol/L — AB (ref 135–145)

## 2015-04-13 LAB — PROTIME-INR
INR: 1.58 — ABNORMAL HIGH (ref 0.00–1.49)
PROTHROMBIN TIME: 18.9 s — AB (ref 11.6–15.2)

## 2015-04-13 MED ORDER — SPIRONOLACTONE 25 MG PO TABS
12.5000 mg | ORAL_TABLET | Freq: Every day | ORAL | Status: DC
  Administered 2015-04-13 – 2015-04-15 (×3): 12.5 mg via ORAL
  Filled 2015-04-13 (×3): qty 1

## 2015-04-13 MED ORDER — FUROSEMIDE 40 MG PO TABS
40.0000 mg | ORAL_TABLET | Freq: Two times a day (BID) | ORAL | Status: DC
  Administered 2015-04-13 – 2015-04-15 (×4): 40 mg via ORAL
  Filled 2015-04-13 (×4): qty 1

## 2015-04-13 MED ORDER — FUROSEMIDE 10 MG/ML IJ SOLN: 40.0000 mg | Freq: Two times a day (BID) | INTRAMUSCULAR | Status: DC

## 2015-04-13 MED ORDER — WARFARIN SODIUM 2.5 MG PO TABS
12.5000 mg | ORAL_TABLET | Freq: Once | ORAL | Status: AC
  Administered 2015-04-13: 12.5 mg via ORAL
  Filled 2015-04-13 (×2): qty 1

## 2015-04-13 NOTE — Progress Notes (Addendum)
ANTICOAGULATION CONSULT NOTE Pharmacy Consult for Heparin/Warfarin  Indication: atrial fibrillation  No Known Allergies  Patient Measurements: Height: 6\' 4"  (193 cm) Weight: 281 lb 3.2 oz (127.551 kg) IBW/kg (Calculated) : 86.8  Vital Signs: Temp: 98.4 F (36.9 C) (07/26 0400) BP: 103/65 mmHg (07/26 0400) Pulse Rate: 65 (07/26 0400)  Labs:  Recent Labs  04/11/15 0405 04/12/15 0251 04/13/15 0425  HGB 13.5 13.2 14.0  HCT 39.3 38.3* 40.8  PLT 149* 140* 159  LABPROT 14.7 16.2* 18.9*  INR 1.13 1.28 1.58*  HEPARINUNFRC 0.63 0.45 0.23*  CREATININE 0.85  --   --     Estimated Creatinine Clearance: 148.2 mL/min (by C-G formula based on Cr of 0.85).   Assessment: 54 y.o. male with Afib for anticoagulation  Goal of Therapy:  INR 2.0-3.0 Heparin level 0.3-0.7 units/ml Monitor platelets by anticoagulation protocol: Yes   Plan:  Increase Heparin 3350 units/hr Coumadin 12.5 mg today  Abbott, Gary Fleet 04/13/2015,5:58 AM  HL 0.64 (therapeutic) Will continue Heparin 3350 units/hr  Give coumadin 12.5mg  today Monitor daily HL/INR/CBC, s/sx of bleeding  Casilda Carls, PharmD. Clinical Pharmacist Resident Pager: [phone]

## 2015-04-13 NOTE — Progress Notes (Signed)
8 beats NSVT noted on telemetry.  Pt asymptomatic.  VSS.  Will continue to monitor.  [name]

## 2015-04-13 NOTE — Progress Notes (Signed)
Patient Name: [name] [name] Date of Encounter: 04/13/2015     Principal Problem:   Acute on chronic systolic heart failure Active Problems:   Obesity   Essential hypertension   CAD- minor CAD 2009 and 2016   VENTRICULAR TACHYCARDIA   Tobacco use disorder- 1/2 pk a day   H/O ETOH abuse 6-8 beers a day   NICM- EF 25% 2012 & 20-25% 2016   Troponin level elevated   Atrial fibrillation with RVR - Paroxysmal    SUBJECTIVE  Breathing better.  No orthopnea overnight.  Weight down 1 pound. (8 pounds total). BMET pending. Recent RHC with elevated filling pressures but normal CO.   CURRENT MEDS . amiodarone  400 mg Oral 3 times per day  . aspirin EC  81 mg Oral Daily  . atorvastatin  80 mg Oral q1800  . carvedilol  12.5 mg Oral BID WC  . furosemide  40 mg Intravenous BID  . lisinopril  2.5 mg Oral Daily  . pantoprazole  40 mg Oral Q0600  . PARoxetine  20 mg Oral BH-q7a  . potassium chloride SA  40 mEq Oral BID  . sodium chloride  3 mL Intravenous Q12H  . spironolactone  12.5 mg Oral Daily  . warfarin  12.5 mg Oral ONCE-1800  . Warfarin - Pharmacist Dosing Inpatient   Does not apply q1800    OBJECTIVE  Filed Vitals:   04/13/15 0400 04/13/15 0500 04/13/15 0852 04/13/15 0856  BP: 103/65  119/87   Pulse: 65   82  Temp: 98.4 F (36.9 C)     TempSrc:      Resp: 20     Height:      Weight:  127.551 kg (281 lb 3.2 oz)    SpO2: 98%       Intake/Output Summary (Last 24 hours) at 04/13/15 1350 Last data filed at 04/13/15 0400  Gross per 24 hour  Intake      0 ml  Output   1500 ml  Net  -1500 ml   Filed Weights   04/11/15 0400 04/12/15 0400 04/13/15 0500  Weight: 127.869 kg (281 lb 14.4 oz) 127.96 kg (282 lb 1.6 oz) 127.551 kg (281 lb 3.2 oz)    PHYSICAL EXAM  General: Pleasant, NAD. Neuro: Alert and oriented X 3. Moves all extremities spontaneously. Psych: Normal affect. HEENT:  Normal  Neck: Supple without bruits or JVD. Lungs:  Resp regular and unlabored,  CTA. Heart: Barrel chested.  RRR no s3, s4, or murmurs. Abdomen: Soft, non-tender, non-distended, BS + x 4.  Extremities: No clubbing, cyanosis or edema. DP/PT/Radials 2+ and equal bilaterally.  Accessory Clinical Findings  CBC  Recent Labs  04/12/15 0251 04/13/15 0425  WBC 4.8 5.9  HGB 13.2 14.0  HCT 38.3* 40.8  MCV 93.4 93.2  PLT 140* 159   Basic Metabolic Panel  Recent Labs  04/11/15 0405  NA 136  K 4.2  CL 101  CO2 28  GLUCOSE 122*  BUN 16  CREATININE 0.85  CALCIUM 8.8*   Lab Results  Component Value Date   INR 1.58* 04/13/2015   INR 1.28 04/12/2015   INR 1.13 04/11/2015    TELE  Rsr, freq pvc's, 4 beats nsvt, 16 beats nsvt yesterday afternoon.  Radiology/Studies  Dg Chest 2 View  04/06/2015   CLINICAL DATA:  Shortness of breath since this morning.  EXAM: CHEST  2 VIEW  COMPARISON:  06/05/2011  FINDINGS: The patient has extensive bilateral interstitial pulmonary edema. Tiny  right effusion. New cardiomegaly with pulmonary vascular congestion. No acute osseous abnormality. Chronic accentuation of the thoracic kyphosis.  IMPRESSION: Prominent interstitial pulmonary edema. Tiny right effusion. Findings consistent with congestive heart failure.   Electronically Signed   By: Francene Boyers M.D.   On: 04/06/2015 12:53   Dg Chest Port 1 View  04/07/2015   CLINICAL DATA:  Acute onset of dyspnea.  Initial encounter.  EXAM: PORTABLE CHEST - 1 VIEW  COMPARISON:  Chest radiograph performed 04/06/2015  FINDINGS: Diffuse bilateral airspace opacification is noted, with mild sparing of the left lung apex, compatible with worsening pulmonary edema or pneumonia. Small bilateral pleural effusions are suspected. No pneumothorax is seen.  The cardiomediastinal silhouette is enlarged. No acute osseous abnormalities are identified.  IMPRESSION: Worsening diffuse bilateral airspace opacification, reflecting either worsening pulmonary edema or pneumonia. Suspect small bilateral pleural  effusions. Cardiomegaly noted.   Electronically Signed   By: Roanna Raider M.D.   On: 04/07/2015 04:37    ASSESSMENT AND PLAN  1. Acute on chronic systolic chf/NICM:  Cath 7/21 w/ nonobs dzs.  Echo 7/21 w/ EF of 15-20%, diff HK.    2.  Ventricular Tachycardia:   3.  PAF:  Quiescent.  INR subRx.  Pharmacy following.  Cont amio/bb.  4.  ETOH/Tob abuse: complete cessation advised.  Volume status looks very good. Switcgh lasix back to po. Agree with spiro. BiVICD tomorrow. Will arrange f/u in [facility].  [name], Daniel,MD 1:52 PM

## 2015-04-13 NOTE — Progress Notes (Signed)
Visit made to patients room to place him on cpap. Patient self administers Cpap.Marland Kitchen Everything was connected correctly and patient was resting well.

## 2015-04-13 NOTE — Progress Notes (Signed)
Patient Name: [name] [name] Date of Encounter: 04/13/2015     Principal Problem:   Acute on chronic systolic heart failure Active Problems:   VENTRICULAR TACHYCARDIA   NICM- EF 25% 2012 & 20-25% 2016   Essential hypertension   H/O ETOH abuse 6-8 beers a day   Troponin level elevated   Atrial fibrillation with RVR - Paroxysmal   Obesity   CAD- minor CAD 2009 and 2016   Tobacco use disorder- 1/2 pk a day    SUBJECTIVE  Breathing much better.  No orthopnea overnight.  Thinks yesterdays episode may have been mediated by anxiety, though he was also felt to be volume overloaded.  CURRENT MEDS . amiodarone  400 mg Oral 3 times per day  . aspirin EC  81 mg Oral Daily  . atorvastatin  80 mg Oral q1800  . carvedilol  12.5 mg Oral BID WC  .  ceFAZolin (ANCEF) IV  3 g Intravenous To SSTC  . furosemide  80 mg Intravenous BID  . gentamicin irrigation  80 mg Irrigation To SSTC  . lisinopril  2.5 mg Oral Daily  . pantoprazole  40 mg Oral Q0600  . PARoxetine  20 mg Oral BH-q7a  . potassium chloride SA  40 mEq Oral BID  . sodium chloride  3 mL Intravenous Q12H  . warfarin  12.5 mg Oral ONCE-1800  . Warfarin - Pharmacist Dosing Inpatient   Does not apply q1800    OBJECTIVE  Filed Vitals:   04/13/15 0400 04/13/15 0500 04/13/15 0852 04/13/15 0856  BP: 103/65  119/87   Pulse: 65   82  Temp: 98.4 F (36.9 C)     TempSrc:      Resp: 20     Height:      Weight:  281 lb 3.2 oz (127.551 kg)    SpO2: 98%       Intake/Output Summary (Last 24 hours) at 04/13/15 1007 Last data filed at 04/13/15 0400  Gross per 24 hour  Intake      0 ml  Output   2400 ml  Net  -2400 ml   Filed Weights   04/11/15 0400 04/12/15 0400 04/13/15 0500  Weight: 281 lb 14.4 oz (127.869 kg) 282 lb 1.6 oz (127.96 kg) 281 lb 3.2 oz (127.551 kg)    PHYSICAL EXAM  General: Pleasant, NAD. Neuro: Alert and oriented X 3. Moves all extremities spontaneously. Psych: Normal affect. HEENT:  Normal  Neck: Supple  without bruits or JVD. Lungs:  Resp regular and unlabored, CTA. Heart: Barrel chested.  RRR no s3, s4, or murmurs. Abdomen: Soft, non-tender, non-distended, BS + x 4.  Extremities: No clubbing, cyanosis or edema. DP/PT/Radials 2+ and equal bilaterally.  Accessory Clinical Findings  CBC  Recent Labs  04/12/15 0251 04/13/15 0425  WBC 4.8 5.9  HGB 13.2 14.0  HCT 38.3* 40.8  MCV 93.4 93.2  PLT 140* 159   Basic Metabolic Panel  Recent Labs  04/11/15 0405  NA 136  K 4.2  CL 101  CO2 28  GLUCOSE 122*  BUN 16  CREATININE 0.85  CALCIUM 8.8*   Lab Results  Component Value Date   INR 1.58* 04/13/2015   INR 1.28 04/12/2015   INR 1.13 04/11/2015    TELE  Rsr, freq pvc's, 4 beats nsvt, 16 beats nsvt yesterday afternoon.  Radiology/Studies  Dg Chest 2 View  04/06/2015   CLINICAL DATA:  Shortness of breath since this morning.  EXAM: CHEST  2 VIEW  COMPARISON:  06/05/2011  FINDINGS: The patient has extensive bilateral interstitial pulmonary edema. Tiny right effusion. New cardiomegaly with pulmonary vascular congestion. No acute osseous abnormality. Chronic accentuation of the thoracic kyphosis.  IMPRESSION: Prominent interstitial pulmonary edema. Tiny right effusion. Findings consistent with congestive heart failure.   Electronically Signed   By: Francene Boyers M.D.   On: 04/06/2015 12:53   Dg Chest Port 1 View  04/07/2015   CLINICAL DATA:  Acute onset of dyspnea.  Initial encounter.  EXAM: PORTABLE CHEST - 1 VIEW  COMPARISON:  Chest radiograph performed 04/06/2015  FINDINGS: Diffuse bilateral airspace opacification is noted, with mild sparing of the left lung apex, compatible with worsening pulmonary edema or pneumonia. Small bilateral pleural effusions are suspected. No pneumothorax is seen.  The cardiomediastinal silhouette is enlarged. No acute osseous abnormalities are identified.  IMPRESSION: Worsening diffuse bilateral airspace opacification, reflecting either worsening  pulmonary edema or pneumonia. Suspect small bilateral pleural effusions. Cardiomegaly noted.   Electronically Signed   By: Roanna Raider M.D.   On: 04/07/2015 04:37    ASSESSMENT AND PLAN  1. Acute on chronic systolic chf/NICM:  Admitted 7/19 with DOE, c/p and volume overload.  Cath 7/21 w/ nonobs dzs.  Echo 7/21 w/ EF of 15-20%, diff HK.  Volume overloaded yesterday and lasix increased to 80 bid (but ordered to start this AM).  Net neg 2.4L in past 24 hrs (minus 4.5L overall).  Wt down 1 lb since yesterday (281 today).  Breathing much better.  No orthopnea overnight.  Volume looks good.  He's not sure of what his dry weight is.  F/u bmet this AM to reassess bun/creat in setting of ongoing diuresis (last checked 7/24).  Back down to 40 IV bid - check BNP - may be able to switch to PO.  Cont bb/acei.  Add spiro.  2.  Ventricular Tachycardia:  In setting of NICM/LV dysfxn.  Was scheduled for BiV ICD yesterday but this was cancelled in light of CHF.  Tentatively scheduled for BiV ICD placement on 7/27. He did have recurrent VT yesterday afternoon.  Cont amio 400 TID.  Has received 4.8 G oral load so far (will be 5.6 G by end of day today).  Cont BB.  3.  PAF:  Quiescent.  INR subRx.  Pharmacy following.  Cont amio/bb.  4.  ETOH/Tob abuse: complete cessation advised.  Signed, Nicolasa Ducking NP   I have seen, examined the patient, and reviewed the above assessment and plan.  On exam ,he is comfortable appearing. Changes to above are made where necessary.   Diuresing well.  Plans for BiV ICD tomorrow by Dr Graciela Husbands discussed with patient.  Co Sign: Hillis Range, MD 04/13/2015 12:27 PM

## 2015-04-14 ENCOUNTER — Encounter (HOSPITAL_COMMUNITY)
Admission: EM | Disposition: A | Discharge status: Home / Self Care | Payer: 59 | Source: Home / Self Care | Attending: Cardiology

## 2015-04-14 DIAGNOSIS — I509 Heart failure, unspecified: Secondary | ICD-10-CM

## 2015-04-14 DIAGNOSIS — I472 Ventricular tachycardia: Secondary | ICD-10-CM

## 2015-04-14 HISTORY — PX: EP IMPLANTABLE DEVICE: SHX172B

## 2015-04-14 LAB — BASIC METABOLIC PANEL
ANION GAP: 8 (ref 5–15)
BUN: 16 mg/dL (ref 6–20)
CALCIUM: 9.2 mg/dL (ref 8.9–10.3)
CO2: 29 mmol/L (ref 22–32)
CREATININE: 0.83 mg/dL (ref 0.61–1.24)
Chloride: 96 mmol/L — ABNORMAL LOW (ref 101–111)
GFR calc Af Amer: >60 mL/min (ref >60)
GLUCOSE: 117 mg/dL — AB (ref 65–99)
Potassium: 4 mmol/L (ref 3.5–5.1)
SODIUM: 133 mmol/L — AB (ref 135–145)

## 2015-04-14 LAB — HEPARIN LEVEL (UNFRACTIONATED): Heparin Unfractionated: 0.77 IU/mL — ABNORMAL HIGH (ref 0.30–0.70)

## 2015-04-14 LAB — PROTIME-INR
INR: 1.87 — AB (ref 0.00–1.49)
Prothrombin Time: 21.4 seconds — ABNORMAL HIGH (ref 11.6–15.2)

## 2015-04-14 LAB — CBC
HEMATOCRIT: 41.8 % (ref 39.0–52.0)
Hemoglobin: 14.6 g/dL (ref 13.0–17.0)
MCH: 31.9 pg (ref 26.0–34.0)
MCHC: 34.9 g/dL (ref 30.0–36.0)
MCV: 91.3 fL (ref 78.0–100.0)
PLATELETS: 173 10*3/uL (ref 150–400)
RBC: 4.58 MIL/uL (ref 4.22–5.81)
RDW: 13.7 % (ref 11.5–15.5)
WBC: 5.6 10*3/uL (ref 4.0–10.5)

## 2015-04-14 SURGERY — BIV ICD INSERTION CRT-D
Anesthesia: LOCAL

## 2015-04-14 MED ORDER — CEFAZOLIN SODIUM 1-5 GM-% IV SOLN
INTRAVENOUS | Status: AC
  Filled 2015-04-14: qty 50

## 2015-04-14 MED ORDER — HEPARIN (PORCINE) IN NACL 2-0.9 UNIT/ML-% IJ SOLN
INTRAMUSCULAR | Status: AC
  Filled 2015-04-14: qty 500

## 2015-04-14 MED ORDER — HEPARIN (PORCINE) IN NACL 2-0.9 UNIT/ML-% IJ SOLN
INTRAMUSCULAR | Status: DC | PRN
  Administered 2015-04-14: 15:00:00

## 2015-04-14 MED ORDER — FENTANYL CITRATE (PF) 100 MCG/2ML IJ SOLN
INTRAMUSCULAR | Status: AC
  Filled 2015-04-14: qty 2

## 2015-04-14 MED ORDER — FENTANYL CITRATE (PF) 100 MCG/2ML IJ SOLN
INTRAMUSCULAR | Status: DC | PRN
  Administered 2015-04-14 (×3): 25 ug via INTRAVENOUS

## 2015-04-14 MED ORDER — LIDOCAINE HCL (PF) 1 % IJ SOLN
INTRAMUSCULAR | Status: DC | PRN
  Administered 2015-04-14: 62 mL via INTRADERMAL

## 2015-04-14 MED ORDER — ACETAMINOPHEN 325 MG PO TABS: 325.0000 mg | ORAL_TABLET | ORAL | Status: DC | PRN

## 2015-04-14 MED ORDER — MIDAZOLAM HCL 5 MG/5ML IJ SOLN
INTRAMUSCULAR | Status: AC
  Filled 2015-04-14: qty 5

## 2015-04-14 MED ORDER — OXYCODONE-ACETAMINOPHEN 5-325 MG PO TABS
1.0000 | ORAL_TABLET | ORAL | Status: DC | PRN
Start: 2015-04-14 — End: 2015-04-15
  Administered 2015-04-14: 2 via ORAL
  Filled 2015-04-14 (×2): qty 2

## 2015-04-14 MED ORDER — WARFARIN SODIUM 2.5 MG PO TABS
12.5000 mg | ORAL_TABLET | Freq: Once | ORAL | Status: AC
  Administered 2015-04-14: 12.5 mg via ORAL
  Filled 2015-04-14 (×2): qty 1

## 2015-04-14 MED ORDER — ONDANSETRON HCL 4 MG/2ML IJ SOLN: 4.0000 mg | Freq: Four times a day (QID) | INTRAMUSCULAR | Status: DC | PRN

## 2015-04-14 MED ORDER — CEFAZOLIN SODIUM 1-5 GM-% IV SOLN
INTRAVENOUS | Status: DC | PRN
Start: 2015-04-14 — End: 2015-04-14
  Administered 2015-04-14: 1 g via INTRAVENOUS

## 2015-04-14 MED ORDER — SODIUM CHLORIDE 0.9 % IR SOLN
Status: AC
  Filled 2015-04-14: qty 2

## 2015-04-14 MED ORDER — CEFAZOLIN SODIUM 1-5 GM-% IV SOLN
1.0000 g | Freq: Four times a day (QID) | INTRAVENOUS | Status: AC
  Administered 2015-04-14 – 2015-04-15 (×3): 1 g via INTRAVENOUS
  Filled 2015-04-14 (×3): qty 50

## 2015-04-14 MED ORDER — CEFAZOLIN SODIUM-DEXTROSE 2-3 GM-% IV SOLR
INTRAVENOUS | Status: AC
  Filled 2015-04-14: qty 50

## 2015-04-14 MED ORDER — SODIUM CHLORIDE 0.9 % IV SOLN: INTRAVENOUS | Status: AC

## 2015-04-14 MED ORDER — LIDOCAINE HCL (PF) 1 % IJ SOLN
INTRAMUSCULAR | Status: AC
  Filled 2015-04-14: qty 30

## 2015-04-14 MED ORDER — IOHEXOL 350 MG/ML SOLN
INTRAVENOUS | Status: DC | PRN
  Administered 2015-04-14: 15 mL via INTRACARDIAC

## 2015-04-14 MED ORDER — CEFAZOLIN SODIUM-DEXTROSE 2-3 GM-% IV SOLR
INTRAVENOUS | Status: DC | PRN
  Administered 2015-04-14: 2 g via INTRAVENOUS

## 2015-04-14 MED ORDER — MIDAZOLAM HCL 5 MG/5ML IJ SOLN
INTRAMUSCULAR | Status: DC | PRN
  Administered 2015-04-14: 1 mg via INTRAVENOUS
  Administered 2015-04-14: 2 mg via INTRAVENOUS
  Administered 2015-04-14: 1 mg via INTRAVENOUS

## 2015-04-14 MED ORDER — LIDOCAINE HCL (PF) 1 % IJ SOLN
INTRAMUSCULAR | Status: AC
  Filled 2015-04-14: qty 60

## 2015-04-14 SURGICAL SUPPLY — 23 items
ADAPTER SEALING SSA-EW-09 (MISCELLANEOUS) ×2 IMPLANT
ATTRACTOMAT 16X20 MAGNETIC DRP (DRAPES) ×2 IMPLANT
BALLN ATTAIN 80 (BALLOONS) ×2
BALLOON ATTAIN 80 (BALLOONS) ×1 IMPLANT
CABLE SURGICAL S-101-97-12 (CABLE) ×2 IMPLANT
CATH ATTAIN COMMAND 6250-MB2 (CATHETERS) ×2 IMPLANT
CATH CPS DIRECT 135 DS2C020 (CATHETERS) ×2 IMPLANT
HEMOSTAT SURGICEL 2X4 FIBR (HEMOSTASIS) ×2 IMPLANT
HOVERMATT SINGLE USE (MISCELLANEOUS) ×2 IMPLANT
ICD VIVA QUAD XT CRT-D DTBA1Q1 (ICD Generator) ×2 IMPLANT
KIT ESSENTIALS PG (KITS) ×2 IMPLANT
LEAD ATTAIN PERFORMA S 4598-88 (Lead) ×2 IMPLANT
LEAD CAPSURE NOVUS 5076-52CM (Lead) ×2 IMPLANT
LEAD SPRINT QUATTRO 6935-75CM (Lead) ×2 IMPLANT
PAD DEFIB LIFELINK (PAD) ×2 IMPLANT
SHEATH CLASSIC 7F (SHEATH) ×2 IMPLANT
SHEATH CLASSIC 9.5F (SHEATH) ×2 IMPLANT
SHEATH CLASSIC 9F (SHEATH) ×2 IMPLANT
SHIELD RADPAD SCOOP 12X17 (MISCELLANEOUS) ×2 IMPLANT
SLITTER 6232ADJ (MISCELLANEOUS) ×2 IMPLANT
TRAY PACEMAKER INSERTION (CUSTOM PROCEDURE TRAY) ×2 IMPLANT
WIRE ACUITY WHISPER EDS 4648 (WIRE) ×2 IMPLANT
WIRE HI TORQ VERSACORE-J 145CM (WIRE) ×4 IMPLANT

## 2015-04-14 NOTE — Progress Notes (Signed)
For BiV ICD  Subjective: No complaints, waiting for ICD procedure.   Objective: Vital signs in last 24 hours: Temp:  [97.6 F (36.4 C)-98.1 F (36.7 C)] 97.7 F (36.5 C) (07/27 0742) Pulse Rate:  [57-76] 57 (07/27 0742) Resp:  [20-22] 20 (07/27 0742) BP: (88-106)/(51-66) 104/58 mmHg (07/27 0742) SpO2:  [94 %-98 %] 94 % (07/27 0742) Weight:  [271 lb 9.6 oz (123.197 kg)] 271 lb 9.6 oz (123.197 kg) (07/27 0400) Weight change: -9 lb 9.6 oz (-4.355 kg) Last BM Date: 04/11/15 Intake/Output from previous day: -1880 ( since admit -7,521)  07/26 0701 - 07/27 0700 In: 720 [P.O.:720] Out: 2600 [Urine:2600] Intake/Output this shift: Total I/O In: 480 [P.O.:480] Out: 400 [Urine:400]  PE: General:Pleasant affect, NAD Skin:Warm and dry, brisk capillary refill HEENT:normocephalic, sclera clear, mucus membranes moist Heart:S1S2 RRR without murmur, gallup, rub or click Lungs:clear without rales, rhonchi, or wheezes ZOX:WRUE, non tender, + BS, do not palpate liver spleen or masses Ext:no lower ext edema, 2+ pedal pulses, 2+ radial pulses Neuro:alert and oriented X 3, MAE, follows commands, + facial symmetry Tele:  SR no VT noted in 24 hours    Lab Results:  Recent Labs  04/13/15 0425 04/14/15 0452  WBC 5.9 5.6  HGB 14.0 14.6  HCT 40.8 41.8  PLT 159 173   BMET  Recent Labs  04/13/15 1107 04/14/15 0452  NA 134* 133*  K 4.3 4.0  CL 93* 96*  CO2 29 29  GLUCOSE 176* 117*  BUN 16 16  CREATININE 0.98 0.83  CALCIUM 9.5 9.2   No results for input(s): TROPONINI in the last 72 hours.  Invalid input(s): CK, MB  Lab Results  Component Value Date   CHOL  12/11/2007    136        ATP III CLASSIFICATION:  <200     mg/dL   Desirable  454-098  mg/dL   Borderline High  >=119    mg/dL   High   HDL 23* 14/78/2956   LDLCALC  12/11/2007    90        Total Cholesterol/HDL:CHD Risk Coronary Heart Disease Risk Table                     Men   Women  1/2 Average Risk    3.4   3.3   TRIG 115 12/11/2007   CHOLHDL 5.9 12/11/2007   Lab Results  Component Value Date   HGBA1C 5.3 04/06/2015     Lab Results  Component Value Date   TSH 1.365 04/06/2015      Studies/Results: No results found.  Medications: I have reviewed the patient's current medications. Scheduled Meds: . amiodarone  400 mg Oral 3 times per day  . aspirin EC  81 mg Oral Daily  . atorvastatin  80 mg Oral q1800  . carvedilol  12.5 mg Oral BID WC  . furosemide  40 mg Oral BID  . lisinopril  2.5 mg Oral Daily  . pantoprazole  40 mg Oral Q0600  . PARoxetine  20 mg Oral BH-q7a  . potassium chloride SA  40 mEq Oral BID  . sodium chloride  3 mL Intravenous Q12H  . spironolactone  12.5 mg Oral Daily  . warfarin  12.5 mg Oral ONCE-1800  . Warfarin - Pharmacist Dosing Inpatient   Does not apply q1800   Continuous Infusions: . sodium chloride 50 mL/hr at 04/14/15 0621  . heparin 3,200 Units/hr (04/14/15  4128)   PRN Meds:.sodium chloride, acetaminophen, ALPRAZolam, [COMPLETED] levalbuterol **FOLLOWED BY** levalbuterol, nitroGLYCERIN, ondansetron (ZOFRAN) IV, sodium chloride, traMADol, zolpidem  Assessment/Plan: 1. Acute on chronic systolic chf/NICM: Admitted 7/19 with DOE, c/p and volume overload. Cath 7/21 w/ nonobs dzs. Echo 7/21 w/ EF of 15-20%, diff HK. Volume overloaded yesterday and lasix increased to 80 bid now on 40 mg po BID.   Net neg 1.8L in past 24 hrs (minus 7.5L overall). Wt down 10 lbs since yesterday (271 today). Breathing much better. No orthopnea overnight. Volume looks good. He's not sure of what his dry weight is. BMP stable with K+ 4.0 and Na 133 Cont bb/acei. Cleda Daub. added  2. Ventricular Tachycardia: In setting of NICM/LV dysfxn. Was scheduled for BiV ICD 7/25 but this was cancelled in light of CHF. Tentatively scheduled for BiV ICD placementtoday. No recurrent VT in 24 hours.  Cont amio 400 TID. Has received 6.0 G oral load so far (will be 6.8 G by end  of day today). Cont BB.  3. PAF: Quiescent. INR subRx. Pharmacy following. Cont amio/bb. INR 1.87 on IV heparin CHA2DS2 VASc score of 2  4. ETOH/Tob abuse: complete cessation has been advised.  Dr. Graciela Husbands to see.   LOS: 8 days   Time spent with pt. :15 minutes. [facility] R  Nurse Practitioner Certified Pager [phone] or after 5pm and on weekends call [phone] 04/14/2015, 10:53 AM

## 2015-04-14 NOTE — Progress Notes (Signed)
He is much improved with elss dyspnea  Will continue diuresis post implant Have reviewed the potential benefits and risks of ICD implantation including but not limited to death, perforation of heart or lung, lead dislodgement, infection,  device malfunction and inappropriate shocks.  The patient express understanding  and are willing to proceed.

## 2015-04-14 NOTE — Progress Notes (Addendum)
ANTICOAGULATION CONSULT NOTE Pharmacy Consult for Heparin/Warfarin  Indication: atrial fibrillation  No Known Allergies  Patient Measurements: Height: 6\' 4"  (193 cm) Weight: 271 lb 9.6 oz (123.197 kg) IBW/kg (Calculated) : 86.8  Vital Signs: Temp: 97.7 F (36.5 C) (07/27 0742) Temp Source: Oral (07/27 0742) BP: 104/58 mmHg (07/27 0742) Pulse Rate: 57 (07/27 0742)  Labs:  Recent Labs  04/12/15 0251 04/13/15 0425 04/13/15 1107 04/13/15 1305 04/14/15 0452  HGB 13.2 14.0  --   --  14.6  HCT 38.3* 40.8  --   --  41.8  PLT 140* 159  --   --  173  LABPROT 16.2* 18.9*  --   --  21.4*  INR 1.28 1.58*  --   --  1.87*  HEPARINUNFRC 0.45 0.23*  --  0.64 0.77*  CREATININE  --   --  0.98  --  0.83    Estimated Creatinine Clearance: 149.3 mL/min (by C-G formula based on Cr of 0.83).   Assessment: 54 y.o. male with Afib for anticoagulation. HL 0.77, INR 1.87. CBC wnl. No bleeding noted.  Goal of Therapy:  INR 2.0-3.0 Heparin level 0.3-0.7 units/ml Monitor platelets by anticoagulation protocol: Yes   Plan:  Decrease Heparin to 3200 units/hr Anticipate heparin d/c after ICD placement today Give coumadin 12.5mg  today Monitor INR/CBC, s/sx of bleeding  Greggory Stallion, PharmD Clinical Pharmacist Pager # [phone] 04/14/2015 8:50 AM

## 2015-04-14 NOTE — Interval H&P Note (Signed)
ICD Criteria  Current LVEF:15%. Within 12 months prior to implant: Yes   Heart failure history: Yes, Class III  Cardiomyopathy history: Yes, Non-Ischemic Cardiomyopathy.  Atrial Fibrillation/Atrial Flutter: No.  Ventricular tachycardia history: Yes, No hemodynamic instability. VT Type: Non-Sustained Ventricular Tachycardia.  Cardiac arrest history: No.  History of syndromes with risk of sudden death: No.  Previous ICD: No.  Current ICD indication: Primary  PPM indication: Yes. Greater than 40% RV pacing requirement anticipated. Class I or II Huston Foley indication present: No  Beta Blocker therapy for 3 or more months: Yes, prescribed.   Ace Inhibitor/ARB therapy for 3 or more months: Yes, prescribed.   History and Physical Interval Note:  04/14/2015 12:06 PM  Brasen Spaid  has presented today for surgery, with the diagnosis of nonischemic cardiomyopathy  The various methods of treatment have been discussed with the patient and family. After consideration of risks, benefits and other options for treatment, the patient has consented to  Procedure(s): BiV ICD Insertion CRT-D (N/A) as a surgical intervention .  The patient's history has been reviewed, patient examined, no change in status, stable for surgery.  I have reviewed the patient's chart and labs.  Questions were answered to the patient's satisfaction.     Sherryl Manges

## 2015-04-14 NOTE — H&P (View-Only) (Signed)
  Subjective:  Called to see this am after pt became diaphoretic per RN. No VT on telelmetry (NSR), B/P and blood sugar stable. He feels better now.   Objective:  Vital Signs in the last 24 hours: Temp:  [97.7 F (36.5 C)-98.9 F (37.2 C)] 97.7 F (36.5 C) (07/25 0800) Pulse Rate:  [65-84] 84 (07/25 0807) Resp:  [19-22] 20 (07/25 0400) BP: (91-118)/(59-77) 118/77 mmHg (07/25 0844) SpO2:  [94 %-100 %] 100 % (07/25 0844) Weight:  [282 lb 1.6 oz (127.96 kg)] 282 lb 1.6 oz (127.96 kg) (07/25 0400)  Intake/Output from previous day:  Intake/Output Summary (Last 24 hours) at 04/12/15 0934 Last data filed at 04/12/15 0700  Gross per 24 hour  Intake 1051.73 ml  Output   1200 ml  Net -148.27 ml    Physical Exam: General appearance: alert, cooperative, no distress, moderately obese and on O2 Lungs: decreased breath sounds, no rales Heart: regular rate and rhythm Extremities: no edema JVP 7-8 CM barrell chested  Abd soft +HJR Skin warm and dry    Rate: 78  Rhythm: normal sinus rhythm  Lab Results:  Recent Labs  04/11/15 0405 04/12/15 0251  WBC 4.5 4.8  HGB 13.5 13.2  PLT 149* 140*    Recent Labs  04/10/15 0407 04/11/15 0405  NA 136 136  K 4.0 4.2  CL 102 101  CO2 25 28  GLUCOSE 121* 122*  BUN 17 16  CREATININE 0.76 0.85   No results for input(s): TROPONINI in the last 72 hours.  Invalid input(s): CK, MB  Recent Labs  04/12/15 0251  INR 1.28    Scheduled Meds: . amiodarone  400 mg Oral 3 times per day  . aspirin EC  81 mg Oral Daily  . atorvastatin  80 mg Oral q1800  . carvedilol  12.5 mg Oral BID WC  .  ceFAZolin (ANCEF) IV  3 g Intravenous To SSTC  . Chlorhexidine Gluconate Cloth  6 each Topical Q0600  . furosemide  40 mg Intravenous BID  . gentamicin irrigation  80 mg Irrigation To SSTC  . lisinopril  2.5 mg Oral Daily  . mupirocin ointment  1 application Nasal BID  . pantoprazole  40 mg Oral Q0600  . PARoxetine  20 mg Oral BH-q7a  .  potassium chloride SA  40 mEq Oral BID  . sodium chloride  3 mL Intravenous Q12H  . warfarin  12.5 mg Oral ONCE-1800  . Warfarin - Pharmacist Dosing Inpatient   Does not apply q1800   Continuous Infusions: . sodium chloride 50 mL (04/12/15 0526)  . heparin Stopped (04/12/15 0759)   PRN Meds:.sodium chloride, acetaminophen, ALPRAZolam, [COMPLETED] levalbuterol **FOLLOWED BY** levalbuterol, nitroGLYCERIN, ondansetron (ZOFRAN) IV, sodium chloride, traMADol, zolpidem   Imaging: Imaging results have been reviewed  Cardiac Studies:  Assessment/Plan:  54 y/o Caucasian male, single, smokes 1/2 pk a day and drinks 6-8 beers a day, works in heating and air. He was followed in the past by Dr Hochrein for NICM, He was admitted 7/19 with acute CHF and new LBBB.  EF 15%.  Cath 7/21- non obstructive CAD. He has had AF with RVR and NSVT since admission.  He is for BiV ICD today.   Principal Problem:   Acute on chronic systolic heart failure Active Problems:   NICM- EF 25% 2012 & 20-25% 2016   Troponin level elevated   Essential hypertension   CAD- minor CAD 2009 and 2016   Atrial fibrillation with RVR - Paroxysmal     Obesity   Tobacco use disorder- 1/2 pk a day   H/O ETOH abuse 6-8 beers a day   PLAN: Bi V ICD today. Amiodarone added. Continue IV BID Lasix. Suspect he will need long term anticoagulation with PAF and severe LVD, currently Heparin on hold for ICD.   Corine Shelter PA-C 04/12/2015, 9:34 AM [phone]  The patient on examination continues to be quite short of breath. His JVP is not particularly striking; this is true not withstanding his LVEDP of 34 and with the episode this morning, I am reluctant to proceed with CRT-D implantation. I've asked the heart failure team to consult and see if we can improve his functional status prior to device implantation  i will increase his IV lasix  He is tentatively rescheduled to Wednesday afternoon

## 2015-04-15 ENCOUNTER — Inpatient Hospital Stay (HOSPITAL_COMMUNITY): Payer: 59

## 2015-04-15 ENCOUNTER — Encounter (HOSPITAL_COMMUNITY): Payer: Self-pay | Admitting: Internal Medicine

## 2015-04-15 ENCOUNTER — Other Ambulatory Visit: Payer: Self-pay | Admitting: Cardiology

## 2015-04-15 DIAGNOSIS — Z95810 Presence of automatic (implantable) cardiac defibrillator: Secondary | ICD-10-CM | POA: Diagnosis not present

## 2015-04-15 DIAGNOSIS — E876 Hypokalemia: Secondary | ICD-10-CM

## 2015-04-15 LAB — BASIC METABOLIC PANEL
ANION GAP: 6 (ref 5–15)
BUN: 20 mg/dL (ref 6–20)
CO2: 29 mmol/L (ref 22–32)
CREATININE: 0.84 mg/dL (ref 0.61–1.24)
Calcium: 8.9 mg/dL (ref 8.9–10.3)
Chloride: 97 mmol/L — ABNORMAL LOW (ref 101–111)
GLUCOSE: 96 mg/dL (ref 65–99)
POTASSIUM: 4.5 mmol/L (ref 3.5–5.1)
Sodium: 132 mmol/L — ABNORMAL LOW (ref 135–145)

## 2015-04-15 LAB — CBC
HCT: 41 % (ref 39.0–52.0)
HEMOGLOBIN: 14.4 g/dL (ref 13.0–17.0)
MCH: 31.9 pg (ref 26.0–34.0)
MCHC: 35.1 g/dL (ref 30.0–36.0)
MCV: 90.7 fL (ref 78.0–100.0)
Platelets: 174 10*3/uL (ref 150–400)
RBC: 4.52 MIL/uL (ref 4.22–5.81)
RDW: 13.7 % (ref 11.5–15.5)
WBC: 6 10*3/uL (ref 4.0–10.5)

## 2015-04-15 LAB — HEPARIN LEVEL (UNFRACTIONATED)

## 2015-04-15 LAB — PROTIME-INR
INR: 2.43 — AB (ref 0.00–1.49)
Prothrombin Time: 26.1 seconds — ABNORMAL HIGH (ref 11.6–15.2)

## 2015-04-15 MED ORDER — LISINOPRIL 2.5 MG PO TABS: 2.5000 mg | ORAL_TABLET | Freq: Every day | ORAL | Status: DC

## 2015-04-15 MED ORDER — POTASSIUM CHLORIDE CRYS ER 20 MEQ PO TBCR: 40.0000 meq | EXTENDED_RELEASE_TABLET | Freq: Every day | ORAL | Status: DC

## 2015-04-15 MED ORDER — WARFARIN SODIUM 10 MG PO TABS: 10.0000 mg | ORAL_TABLET | Freq: Every day | ORAL | Status: DC

## 2015-04-15 MED ORDER — WARFARIN SODIUM 10 MG PO TABS: 10.0000 mg | ORAL_TABLET | Freq: Once | ORAL | Status: DC

## 2015-04-15 MED ORDER — ACETAMINOPHEN 325 MG PO TABS: 650.0000 mg | ORAL_TABLET | ORAL | Status: DC | PRN

## 2015-04-15 MED ORDER — CARVEDILOL 12.5 MG PO TABS: 12.5000 mg | ORAL_TABLET | Freq: Two times a day (BID) | ORAL | Status: DC

## 2015-04-15 MED ORDER — ATORVASTATIN CALCIUM 80 MG PO TABS: 80.0000 mg | ORAL_TABLET | Freq: Every day | ORAL | Status: DC

## 2015-04-15 MED ORDER — WARFARIN SODIUM 2.5 MG PO TABS: 2.5000 mg | ORAL_TABLET | ORAL | Status: DC

## 2015-04-15 MED ORDER — AMIODARONE HCL 400 MG PO TABS: 400.0000 mg | ORAL_TABLET | Freq: Three times a day (TID) | ORAL | Status: DC

## 2015-04-15 MED ORDER — FUROSEMIDE 40 MG PO TABS: 40.0000 mg | ORAL_TABLET | Freq: Every day | ORAL | Status: DC

## 2015-04-15 MED ORDER — SPIRONOLACTONE 25 MG PO TABS: 12.5000 mg | ORAL_TABLET | Freq: Every day | ORAL | Status: DC

## 2015-04-15 MED ORDER — PANTOPRAZOLE SODIUM 40 MG PO TBEC
40.0000 mg | DELAYED_RELEASE_TABLET | Freq: Every day | ORAL | Status: DC
Start: 2015-04-15 — End: 2015-12-18

## 2015-04-15 MED ORDER — OXYCODONE-ACETAMINOPHEN 5-325 MG PO TABS: 1.0000 | ORAL_TABLET | ORAL | Status: DC | PRN

## 2015-04-15 MED FILL — Gentamicin Sulfate Inj 40 MG/ML: INTRAMUSCULAR | Qty: 2 | Status: AC

## 2015-04-15 MED FILL — Sodium Chloride Irrigation Soln 0.9%: Qty: 500 | Status: AC

## 2015-04-15 NOTE — Evaluation (Signed)
Physical Therapy Evaluation Patient Details Name: [name] [name] MRN: [id number] DOB: Aug 18, 1961 Today's Date: 04/15/2015   History of Present Illness  pt is a 54 y/o male admitted 7 19 with acute CHF and new LBBB and EF of 15%,  S/p CATH with non obstructive CAD.  S/P ICD 7/27.  Clinical Impression  Pt is at or close to baseline functioning and should be safe at home with available assist. There are no further acute PT needs.   Basic ICD precaution education completed  Will sign off at this time.     Follow Up Recommendations No PT follow up    Equipment Recommendations  None recommended by PT    Recommendations for Other Services       Precautions / Restrictions Precautions Precautions: ICD/Pacemaker      Mobility  Bed Mobility Overal bed mobility: Modified Independent                Transfers Overall transfer level: Independent                  Ambulation/Gait Ambulation/Gait assistance: Independent Ambulation Distance (Feet): 200 Feet   Gait Pattern/deviations: Step-through pattern   Gait velocity interpretation: at or above normal speed for age/gender General Gait Details: steady,  functional speed,  mild dyspnea  Stairs Stairs: Yes Stairs assistance: Modified independent (Device/Increase time) Stair Management: One rail Right;Alternating pattern;Forwards Number of Stairs: 7 General stair comments: safe  Wheelchair Mobility    Modified Rankin (Stroke Patients Only)       Balance Overall balance assessment: No apparent balance deficits (not formally assessed)                                           Pertinent Vitals/Pain Pain Assessment: Faces Faces Pain Scale: Hurts a little bit Pain Location: L shoulder. Pain Descriptors / Indicators: Grimacing Pain Intervention(s): Monitored during session    Home Living Family/patient expects to be discharged to:: Private residence Living Arrangements: Alone (daughter in and  out)   Type of Home: House Home Access: Stairs to enter   Secretary/administrator of Steps: several Home Layout: One level Home Equipment: None      Prior Function Level of Independence: Independent         Comments: works in Immunologist   Dominant Hand: Right    Extremity/Trunk Assessment   Upper Extremity Assessment: RUE deficits/detail RUE Deficits / Details: WFL         Lower Extremity Assessment: Overall WFL for tasks assessed      Cervical / Trunk Assessment: Normal  Communication   Communication: No difficulties  Cognition Arousal/Alertness: Awake/alert Behavior During Therapy: WFL for tasks assessed/performed Overall Cognitive Status: Within Functional Limits for tasks assessed                      General Comments General comments (skin integrity, edema, etc.): discussed ICD precautions    Exercises        Assessment/Plan    PT Assessment Patent does not need any further PT services  PT Diagnosis     PT Problem List    PT Treatment Interventions     PT Goals (Current goals can be found in the Care Plan section) Acute Rehab PT Goals PT Goal Formulation: All assessment and education complete, DC therapy  Frequency     Barriers to discharge        Co-evaluation               End of Session   Activity Tolerance: Patient tolerated treatment well Patient left: Other (comment) (up in room)           Time: 1350-1405 PT Time Calculation (min) (ACUTE ONLY): 15 min   Charges:   PT Evaluation $Initial PT Evaluation Tier I: 1 Procedure     PT G Codes:        Nataliya Graig, Eliseo Gum 04/15/2015, 2:16 PM 04/15/2015  Baneberry Bing, PT [phone]  (pager)

## 2015-04-15 NOTE — Discharge Summary (Signed)
Physician Discharge Summary       Patient ID: [name] [name] MRN: [id number] DOB/AGE: 1961/08/08 54 y.o.  Admit date: 04/06/2015 Discharge date: 04/15/2015 Primary Cardiologist:Dr. Hochrein   Discharge Diagnoses:  Principal Problem:   Acute on chronic systolic heart failure Active Problems:   VENTRICULAR TACHYCARDIA   Atrial fibrillation with RVR - Paroxysmal   Obesity   Essential hypertension   CAD- minor CAD 2009 and 2016   Tobacco use disorder- 1/2 pk a day   H/O ETOH abuse 6-8 beers a day   NICM- EF 25% 2012 & 20-25% 2016   Troponin level elevated   S/P ICD (internal cardiac defibrillator) procedure Medtronic CRT ICD   Discharged Condition: good  Procedures: 04/14/15  biV ICD placed. By Dr. Graciela Husbands  NICM LBBB VT CHF- MDT   04/08/15  Dr. Eldridge Dace did Cath 04/08/15    Hospital Course: 54 y/o Caucasian male, single, works in heating and air, followed by Dr Antoine Poche for NICM. His last OV was 2013. Dr Jeannetta Nap is his PCP. His EF in 2012 was 25%. He had minor CAD in 2009. He was morbidly obese but says he has lost almost 100 lbs over the past few years. He smokes 1/2 pk a day and drinks 6-8 beers a day. He has run out of his Lasix in the past, but refills when he gets SOB. He went back on Lasix two weeks ago for this reason but for the past few days he has had increasing DOE. He denies orthopnea or LE edema. Yesterday he became quite SOB after a shower. Today he didn't feel like he could go to work and came to the ED. In the ED he is SOB at rest (despite Lasix 40 mg). He also complains of SSCP "tightness" and has an elevated Troponin and (?) new LBBB on EKG.   After admit developed hypoxia and increased CHF needing BiPap.  He was diuresed.  2D echo showed EF of 20-25%. And G2DD.  R/LHC was performed and showed nonobstructive CAD with elevated LVEDP and moderate pulmonary artery hypertension.     He developed PAF.  It was felt with CHA2DS2Vasc score of 2 he would need long term  anticoagulation.  Dr. Rennis Golden felt coumadin was best choice as we could reverse and we would also monitor pt more freq.  By 04/09/15 he developed 30 beat run of V tach.  EP was consulted.  Previously had short bursts of NSVT. This was on BB of coreg.  With V tach and concomitant PAF Dr. Graciela Husbands added amiodarone which has been 400 mg every 8 hours decreasing on Friday to 400 mg BID and then after 2 weeks to 200 mg daily.   BiV-ICD was recommended. Just prior to placing developed recurrent CHF and was again diuresed.  By 04/14/15 pt was stable for procedure and tolerated well.  By the next am he was able to ambulate without issues and PT evaluated to insure he was safe to return home.  His CXR was without pneumothorax.  His device was interrogated and all parameters were stable.    He was seen and evaluated by Dr. Graciela Husbands and found stable for discharge.  He will need a BMP on Monday along with INR at our clinic.  We will keep out of work for 1 month.  He will follow with wound check, HF clinic and Dr. Antoine Poche.  He will need reprogram to distal pacing sites based on CXR.   Continue Guideline directed medical therapy>> reluctant to try  entresto at this juncture 2/2 low BP Decrease furosemide to daily given climbing BUN and low pressures at access yesterday and recheck BMP on Monday.  Consults: cardiology  Significant Diagnostic Studies:  BMP Latest Ref Rng 04/15/2015 04/14/2015 04/13/2015  Glucose 65 - 99 mg/dL 96 960(A) 540(J)  BUN 6 - 20 mg/dL 20 16 16   Creatinine 0.61 - 1.24 mg/dL 8.11 9.14 7.82  Sodium 135 - 145 mmol/L 132(L) 133(L) 134(L)  Potassium 3.5 - 5.1 mmol/L 4.5 4.0 4.3  Chloride 101 - 111 mmol/L 97(L) 96(L) 93(L)  CO2 22 - 32 mmol/L 29 29 29   Calcium 8.9 - 10.3 mg/dL 8.9 9.2 9.5   CBC Latest Ref Rng 04/15/2015 04/14/2015 04/13/2015  WBC 4.0 - 10.5 K/uL 6.0 5.6 5.9  Hemoglobin 13.0 - 17.0 g/dL 95.6 21.3 08.6  Hematocrit 39.0 - 52.0 % 41.0 41.8 40.8  Platelets 150 - 400 K/uL 174 173 159       BNP (last 3 results)  Recent Labs  04/06/15 1205 04/13/15 1107  BNP 546.6* 571.6*    INR 2.43  C.Diff negative.  Cardiac Cath:  Nonobstructive coronary artery disease.  Elevated LVEDP  Moderate pulmonary artery hypertension.  Cardiac output 5.5 L/min; CI 2.1.      ECHO: Study Conclusions - Left ventricle: The cavity size was severely dilated. There was mild concentric hypertrophy. Systolic function was severely reduced. The estimated ejection fraction was in the range of 15 to 20%. Diffuse hypokinesis. Doppler parameters are consistent with restrictive physiology, indicative of decreased left ventricular diastolic compliance and/or increased left atrial pressure. - Aortic valve: Trileaflet; normal thickness leaflets. There was no regurgitation. - Aortic root: The aortic root was normal in size. - Mitral valve: Structurally normal valve. There was mild regurgitation. - Left atrium: The atrium was severely dilated. - Right ventricle: The cavity size was moderately dilated. Wall thickness was normal. Systolic function was moderately reduced. - Right atrium: The atrium was moderately dilated. - Tricuspid valve: There was mild regurgitation. - Pulmonary arteries: Systolic pressure was within the normal range. - Inferior vena cava: The vessel was normal in size. - Pericardium, extracardiac: There was no pericardial effusion.   CHEST - 2 VIEW COMPARISON: 04/07/2015 FINDINGS: Cardiac shadow is stable but mildly enlarged. There is been significant improvement in the degree of central vascular congestion and CHF. Only mild central vascular prominence is noted. A new defibrillator is seen in satisfactory position. No pneumothorax is noted. Small right-sided pleural effusion is seen. IMPRESSION: Persistent but significantly decreased vascular congestion. No significant pulmonary edema is noted.  No pneumothorax is noted following  defibrillator placement.   Discharge Exam: Blood pressure 91/61, pulse 67, temperature 97.8 F (36.6 C), temperature source Oral, resp. rate 18, height 6\' 4"  (1.93 m), weight 274 lb 12.8 oz (124.648 kg), SpO2 98 %.  Disposition: 01-Home or Self Care     Medication List    STOP taking these medications        ibuprofen 200 MG tablet  Commonly known as:  ADVIL,MOTRIN     POTASSIUM BICARBONATE PO      TAKE these medications        acetaminophen 325 MG tablet  Commonly known as:  TYLENOL  Take 2 tablets (650 mg total) by mouth every 4 (four) hours as needed for headache or mild pain.     amiodarone 400 MG tablet  Commonly known as:  PACERONE  Take 1 tablet (400 mg total) by mouth every 8 (eight) hours. Take every 8  hours until tomorrow 04/16/15 then begin 400 mg twice a day for 2 weeks until 04/30/15 then begin 200 mg daily- half a tab.     atorvastatin 80 MG tablet  Commonly known as:  LIPITOR  Take 1 tablet (80 mg total) by mouth daily at 6 PM.     carvedilol 12.5 MG tablet  Commonly known as:  COREG  Take 1 tablet (12.5 mg total) by mouth 2 (two) times daily with a meal.     furosemide 40 MG tablet  Commonly known as:  LASIX  Take 1 tablet (40 mg total) by mouth daily.  Start taking on:  04/16/2015     glucosamine-chondroitin 500-400 MG tablet  Take 2 tablets by mouth daily.     lisinopril 2.5 MG tablet  Commonly known as:  PRINIVIL,ZESTRIL  Take 1 tablet (2.5 mg total) by mouth daily.     oxyCODONE-acetaminophen 5-325 MG per tablet  Commonly known as:  PERCOCET/ROXICET  Take 1-2 tablets by mouth every 4 (four) hours as needed for moderate pain.     pantoprazole 40 MG tablet  Commonly known as:  PROTONIX  Take 1 tablet (40 mg total) by mouth daily at 6 (six) AM.     PARoxetine 20 MG tablet  Commonly known as:  PAXIL  Take 20 mg by mouth every morning.     potassium chloride SA 20 MEQ tablet  Commonly known as:  KLOR-CON M20  Take 2 tablets (40 mEq total)  by mouth daily.  Start taking on:  04/16/2015     spironolactone 25 MG tablet  Commonly known as:  ALDACTONE  Take 0.5 tablets (12.5 mg total) by mouth daily.     warfarin 10 MG tablet  Commonly known as:  COUMADIN  Take 1 tablet (10 mg total) by mouth daily.     warfarin 2.5 MG tablet  Commonly known as:  COUMADIN  Take 1 tablet (2.5 mg total) by mouth every other day.  Start taking on:  04/17/2015       Follow-up Information    Follow up with Arvilla Meres, MD. Go on 05/03/2015.   Specialty:  Cardiology   Why:  at 2:40pm in the [facility]--gate code [id number]--please bring all medications to appt   Contact information:   [address] [place] [address] [phone]       Follow up with [facility] On 04/26/2015.   Specialty:  Cardiology   Why:  at 12 noon for wound check of ICD   Contact information:   [address] [place] [address] [phone]      Follow up with Sherryl Manges, MD On 07/22/2015.   Specialty:  Cardiology   Why:  at 1:45 pm with Dr. Lyndel Pleasure information:   [address] [place] [address] [phone]       Follow up with [name], MD On 06/11/2015.   Specialty:  Cardiology   Why:  at 10:30 am   Contact information:   [address] [place] [address] [phone]       Follow up with [facility] On 04/19/2015.   Specialty:  Cardiology   Why:  at 9:30 AM    Contact information:   [address], [address] [place] [address] [phone]       Discharge Instructions: pacemaker /ICD instructions  Heart healthy Diet  Weigh daily Call [phone] if weight climbs more than 3 pounds in a day or 5 pounds in a week. No salt to very little salt in your diet.  No more than 2000 mg in a day. Call if increased shortness of breath or increased swelling.  No work for 1  month.   For your coumadin/warfarin  You will take 10 mg today and tomorrow only then beginning SAT take 10 mg daily and 2.5 mg tablet every other day IE:  SAT 12.5 mg, Sunday 10 mg , Monday 12.5 mg...  STop smoking   Stop alcohol- this will cause the coumadin to work stronger and may cause bleeding.   Signed: [name] Nurse Practitioner-Certified [facility]: [facility] 04/15/2015, 3:57 PM  Time spent on discharge :>35 minutes.

## 2015-04-15 NOTE — Progress Notes (Signed)
Patient Name: [name] [name]      SUBJECTIVE: without complaint except some shoulder soreness  Breathing is stable not notable better or worse  Past Medical History  Diagnosis Date  . HTN (hypertension)   . OSA on CPAP   . CAD (coronary artery disease)     Mild nonobstructive (Cath 09)  . Ventricular tachycardia   . Dyspnea   . Obesity, morbid   . Chronic systolic heart failure     Nonischemic CM: echo 4/12 with EF 25-30%, grade 2 diast dysfxn, mild dilated aortic root 43 mm, trivial MR, mod LAE  . Hypersomnia   . Tobacco use disorder   . H/O ETOH abuse   . CHF (congestive heart failure)     Scheduled Meds:  Scheduled Meds: . amiodarone  400 mg Oral 3 times per day  . aspirin EC  81 mg Oral Daily  . atorvastatin  80 mg Oral q1800  . carvedilol  12.5 mg Oral BID WC  .  ceFAZolin (ANCEF) IV  1 g Intravenous Q6H  . furosemide  40 mg Oral BID  . lisinopril  2.5 mg Oral Daily  . pantoprazole  40 mg Oral Q0600  . PARoxetine  20 mg Oral BH-q7a  . potassium chloride SA  40 mEq Oral BID  . sodium chloride  3 mL Intravenous Q12H  . spironolactone  12.5 mg Oral Daily  . Warfarin - Pharmacist Dosing Inpatient   Does not apply q1800   Continuous Infusions:  sodium chloride, acetaminophen, acetaminophen, ALPRAZolam, [COMPLETED] levalbuterol **FOLLOWED BY** levalbuterol, nitroGLYCERIN, ondansetron (ZOFRAN) IV, oxyCODONE-acetaminophen, sodium chloride, traMADol, zolpidem    PHYSICAL EXAM Filed Vitals:   04/15/15 0402 04/15/15 0521 04/15/15 0747 04/15/15 0748  BP: 109/68  104/70   Pulse:    64  Temp:  98 F (36.7 C)    TempSrc:  Oral    Resp:      Height:      Weight:  274 lb 12.8 oz (124.648 kg)    SpO2:        Well developed and nourished in no acute distress HENT normal Neck supple with JVP-flat Clear and barrel chested No hematoma Regular rate and rhythm, no murmurs or gallops Abd-soft with active BS No Clubbing cyanosis edema Skin-warm and dry A &  Oriented  Grossly normal sensory and motor function   TELEMETRY: Reviewed telemetry pt in P-synchronous/ AV  pacing :    Intake/Output Summary (Last 24 hours) at 04/15/15 0749 Last data filed at 04/15/15 0521  Gross per 24 hour  Intake    720 ml  Output   1700 ml  Net   -980 ml    LABS: Basic Metabolic Panel:  Recent Labs Lab 04/08/15 0852  04/09/15 0325 04/10/15 0407 04/11/15 0405 04/13/15 1107 04/14/15 0452 04/15/15 0405  NA  --   --  137 136 136 134* 133* 132*  K  --   --  3.4* 4.0 4.2 4.3 4.0 4.5  CL  --   --  102 102 101 93* 96* 97*  CO2  --   --  28 25 28 29 29 29   GLUCOSE  --   --  118* 121* 122* 176* 117* 96  BUN  --   --  20 17 16 16 16 20   CREATININE  --   --  0.78 0.76 0.85 0.98 0.83 0.84  CALCIUM  --   < > 8.5* 8.4* 8.8* 9.5 9.2 8.9  MG 1.9  --   --  2.1  --   --   --   --   < > = values in this interval not displayed. Cardiac Enzymes: No results for input(s): CKTOTAL, CKMB, CKMBINDEX, TROPONINI in the last 72 hours. CBC:  Recent Labs Lab 04/09/15 0325 04/10/15 0407 04/11/15 0405 04/12/15 0251 04/13/15 0425 04/14/15 0452 04/15/15 0405  WBC 5.1 4.8 4.5 4.8 5.9 5.6 6.0  HGB 13.1 13.1 13.5 13.2 14.0 14.6 14.4  HCT 38.2* 38.4* 39.3 38.3* 40.8 41.8 41.0  MCV 92.7 93.0 93.3 93.4 93.2 91.3 90.7  PLT 146* 134* 149* 140* 159 173 174   PROTIME:  Recent Labs  04/13/15 0425 04/14/15 0452 04/15/15 0405  LABPROT 18.9* 21.4* 26.1*  INR 1.58* 1.87* 2.43*   Liver Function Tests: No results for input(s): AST, ALT, ALKPHOS, BILITOT, PROT, ALBUMIN in the last 72 hours. No results for input(s): LIPASE, AMYLASE in the last 72 hours. BNP: BNP (last 3 results)  Recent Labs  04/06/15 1205 04/13/15 1107  BNP 546.6* 571.6*    niormal device function  ASSESSMENT AND PLAN:  Principal Problem:   Acute on chronic systolic heart failure Active Problems:   Obesity   Essential hypertension   CAD- minor CAD 2009 and 2016   VENTRICULAR TACHYCARDIA    Tobacco use disorder- 1/2 pk a day   H/O ETOH abuse 6-8 beers a day   NICM- EF 25% 2012 & 20-25% 2016   Troponin level elevated   Atrial fibrillation with RVR - Paroxysmal  Continue coumadin Continue amio 400q8>>400 bid on Friday x 2 weeks then 400 qd x 4 weeks then 200 qd Will reprogram to distal pacing sites based on CXR Continue Guideline directed medical therapy>> reluctant to try entresto at this juncture 2/2 low BP Decrease furosemide to daily given climbing BUN and low pressures at access yesterday   Signed, Sherryl Manges MD  04/15/2015

## 2015-04-15 NOTE — Progress Notes (Signed)
ANTICOAGULATION CONSULT NOTE Pharmacy Consult for Warfarin  Indication: atrial fibrillation  No Known Allergies  Patient Measurements: Height: 6\' 4"  (193 cm) Weight: 274 lb 12.8 oz (124.648 kg) IBW/kg (Calculated) : 86.8  Vital Signs: Temp: 98.5 F (36.9 C) (07/28 0840) Temp Source: Oral (07/28 0840) BP: 118/69 mmHg (07/28 0840) Pulse Rate: 65 (07/28 0840)  Labs:  Recent Labs  04/13/15 0425 04/13/15 1107 04/13/15 1305 04/14/15 0452 04/15/15 0405  HGB 14.0  --   --  14.6 14.4  HCT 40.8  --   --  41.8 41.0  PLT 159  --   --  173 174  LABPROT 18.9*  --   --  21.4* 26.1*  INR 1.58*  --   --  1.87* 2.43*  HEPARINUNFRC 0.23*  --  0.64 0.77* <0.10*  CREATININE  --  0.98  --  0.83 0.84    Estimated Creatinine Clearance: 148.3 mL/min (by C-G formula based on Cr of 0.84).   Assessment: 54 y.o. male who presented on 7/19 to the ED with SOB. He was admitted 7/20 with HF and found to be in and out of Afib starting 7/21. Pharmacy consulted for anticoagulation for Afib. Heparin/warfarin resumed post-cath for afib, heparin stopped 7/27 s/p ICD placement.  INR 2.43. CBC stable, no bleeding noted.   Goal of Therapy:  INR 2.0-3.0 Monitor platelets by anticoagulation protocol: Yes   Plan:  Give coumadin 10 mg po x1 Monitor INR, s/sx of bleeding  Greggory Stallion, PharmD Clinical Pharmacist Pager # [phone] 04/15/2015 9:44 AM

## 2015-04-15 NOTE — Op Note (Signed)
NAMERAYMUND, MANRIQUE NO.:  [id number]  MEDICAL RECORD NO.:  [id number]  LOCATION:  [id number]                        FACILITY:  [facility]  PHYSICIAN:  [name], MD, FACCDATE OF BIRTH:  05/23/1961  DATE OF PROCEDURE:  04/14/2015 DATE OF DISCHARGE:                              OPERATIVE REPORT   PREOPERATIVE DIAGNOSES:  Nonischemic cardiomyopathy, congestive heart failure and left bundle-branch block, ventricular tachycardia.  POSTOPERATIVE DIAGNOSES:  Nonischemic cardiomyopathy, congestive heart failure and left bundle-branch block, ventricular tachycardia.  PROCEDURES:  Dual-chamber defibrillator implantation with left ventricular lead placement and high-voltage lead assessment.  DESCRIPTION OF PROCEDURE:  Following obtaining informed consent, the patient was brought to the Electrophysiology Laboratory and placed on the fluoroscopic table in a supine position.  After routine prep and drape of the left upper chest, lidocaine was infiltrated in the prepectoral subclavicular region.  Incision was made and carried down to layer of the prepectoral fascia using electrocautery and sharp dissection.  A pocket was formed similarly, hemostasis was obtained.  Thereafter, attention was turned to gaining access to the extrathoracic left subclavian vein, which was accomplished with moderate difficulty. The artery was punctured on three occasions.  I ended up double wiring to allow for the third access.  So, we had two separate venipunctures and through these, we sequentially passed a 9-French, 9.5-French and 7- [place] sheath.  Through the 9-French sheath, a Medtronic 6935 75-cm active fixation single-coil defibrillator lead, serial number [id number] was passed under fluoroscopic guidance with moderate difficulty of the RV apex. The RV was markedly rotated.  In this location, the bipolar R-wave was 11 with a pacing impedance of 696, a threshold of 0.8 volts at  0.5 milliseconds, current threshold was 0.9 mA, and there was no diaphragmatic pacing at 10 volts.  The current of injury was brisk. This lead was secured to the prepectoral fascia.  We then sought to obtain access to the coronary artery sinus, which was also accomplished with modest difficulty.  There was a major vertical takeoff, a secondary bend and then a valve halfway up the body, all of which made it little bit of a challenge.  Finally, we were able to pass a Whisper wire with grace and lock, a Medtronic 4598 88-cm lead, serial number [id number] was passed to the junction between the mid and distal third.  In the 4-3 configuration, the bipolar L-wave was 30 with a pacing impedance of 867, a threshold was less than 1 volt at 0.5 milliseconds.  There was no diaphragmatic pacing at 10 volts and the L- QV ratio was greater than 50%.  This lead was secured to the prepectoral fascia following removal of the CS deployment system.  We then placed the RA lead which was a Medtronic 5076 lead, serial number [id number].  The right atrium turned out to be enormous.  The P- wave was 1.8 with a pacing impedance of 832 with a threshold 1.2 volts at 0.5 milliseconds.  There was no diaphragmatic pacing at 10 volts and the current of injury was brisk.  This lead was secured to the prepectoral fascia.  With these acceptable parameters recorded, the leads were attached to a Medtronic  Viva CRT ICD, serial number [id number].  Through the device, the bipolar P-wave was 1 with a pacing impedance of 532 with a threshold 0.5 at 0.4.  The R-wave was 12.9 with a pacing impedance of 513, a threshold of 0.5 at 0.4, and the LV impedance was 722 with a threshold of 1.75 at 0.4 in the 4-3 configuration.  With these acceptable parameters recorded, the leads and the device were placed in the pocket, secured to the prepectoral fascia.  Surgicel was placed along the cephalad, lateral and medial aspect of the  incision and the wound was then closed in two layers in normal fashion.  The wound was washed, dried and a Dermabond dressing was applied.  Needle counts, sponge counts, and instrument counts were correct at the end of the procedure according to staff.  The patient tolerated the procedure without apparent complication.  Estimated blood loss was minimal.     [name], MD, [facility]     SCK/MEDQ  D:  04/14/2015  T:  04/15/2015  Job:  [id number]

## 2015-04-16 ENCOUNTER — Telehealth: Payer: Self-pay | Admitting: Cardiology

## 2015-04-16 NOTE — Telephone Encounter (Signed)
D/ C phone Call .Marland Kitchen Appt on 08/15 at 2:40pm at [facility]

## 2015-04-16 NOTE — Telephone Encounter (Signed)
Patient contacted regarding discharge from [facility] on 04/15/2015.  Patient understands to follow up with provider on 08/15 at 2:40 [facility]. Patient understands discharge instructions? Yes Patient understands medications and regiment? Yes Patient understands to bring all medications to this visit? Yes

## 2015-04-19 ENCOUNTER — Other Ambulatory Visit: Payer: Self-pay

## 2015-04-19 ENCOUNTER — Ambulatory Visit (INDEPENDENT_AMBULATORY_CARE_PROVIDER_SITE_OTHER): Payer: 59 | Admitting: *Deleted

## 2015-04-19 ENCOUNTER — Telehealth: Payer: Self-pay

## 2015-04-19 ENCOUNTER — Other Ambulatory Visit (INDEPENDENT_AMBULATORY_CARE_PROVIDER_SITE_OTHER): Payer: 59 | Admitting: *Deleted

## 2015-04-19 DIAGNOSIS — E876 Hypokalemia: Secondary | ICD-10-CM | POA: Diagnosis not present

## 2015-04-19 DIAGNOSIS — Z95810 Presence of automatic (implantable) cardiac defibrillator: Secondary | ICD-10-CM | POA: Diagnosis not present

## 2015-04-19 DIAGNOSIS — I472 Ventricular tachycardia: Secondary | ICD-10-CM | POA: Diagnosis not present

## 2015-04-19 DIAGNOSIS — Z5181 Encounter for therapeutic drug level monitoring: Secondary | ICD-10-CM

## 2015-04-19 DIAGNOSIS — I4729 Other ventricular tachycardia: Secondary | ICD-10-CM

## 2015-04-19 DIAGNOSIS — I4891 Unspecified atrial fibrillation: Secondary | ICD-10-CM | POA: Diagnosis not present

## 2015-04-19 LAB — BASIC METABOLIC PANEL
BUN: 16 mg/dL (ref 6–23)
CO2: 28 mEq/L (ref 19–32)
Calcium: 9.2 mg/dL (ref 8.4–10.5)
Chloride: 98 mEq/L (ref 96–112)
Creatinine, Ser: 0.88 mg/dL (ref 0.40–1.50)
GFR: 96.53 mL/min (ref >60.00)
GLUCOSE: 128 mg/dL — AB (ref 70–99)
Potassium: 4.9 mEq/L (ref 3.5–5.1)
Sodium: 134 mEq/L — ABNORMAL LOW (ref 135–145)

## 2015-04-19 LAB — POCT INR: INR: 2.1

## 2015-04-19 MED ORDER — AMIODARONE HCL 400 MG PO TABS: 400.0000 mg | ORAL_TABLET | Freq: Three times a day (TID) | ORAL | Status: DC

## 2015-04-19 MED ORDER — AMIODARONE HCL 200 MG PO TABS: 200.0000 mg | ORAL_TABLET | Freq: Every day | ORAL | Status: DC

## 2015-04-19 NOTE — Telephone Encounter (Signed)
Patient did not have Amiodarone filled after being discharged from hospital. Patient's daughter found this and called [place].  MD changed directions on medications.  I called [facility] To change directions for patient.  He is to take 200 mg daily and follow up in the coumadin clinic.  Not the original directions.

## 2015-04-19 NOTE — Addendum Note (Signed)
Addended by: Tonita Phoenix on: 04/19/2015 09:37 AM   Modules accepted: Orders

## 2015-04-19 NOTE — Addendum Note (Signed)
Addended by: Tonita Phoenix on: 04/19/2015 09:38 AM   Modules accepted: Orders

## 2015-04-19 NOTE — Patient Instructions (Signed)

## 2015-04-19 NOTE — Telephone Encounter (Signed)
Should just take 200 mg of amiodarone daily at this point needs closer f/u in coumadin clinic given interaction

## 2015-04-19 NOTE — Telephone Encounter (Signed)
Patients daughter called in stating that the patient did not get his Amiodarone was not filled after he was discharged from the hospital. (It was an oversight). The daughter discovered this last night.  She called to see if we could send it to be filled.  It has specific directions.  If he should not go by the directions since he was starting late, please call them.  I  sent the refill in to the pharmacy. However, I told her to wait a little while to pick it up in case the directions change.  Please advise.

## 2015-04-26 ENCOUNTER — Ambulatory Visit (INDEPENDENT_AMBULATORY_CARE_PROVIDER_SITE_OTHER): Payer: 59 | Admitting: *Deleted

## 2015-04-26 ENCOUNTER — Encounter: Payer: Self-pay | Admitting: Internal Medicine

## 2015-04-26 DIAGNOSIS — I4729 Other ventricular tachycardia: Secondary | ICD-10-CM

## 2015-04-26 DIAGNOSIS — I472 Ventricular tachycardia: Secondary | ICD-10-CM | POA: Diagnosis not present

## 2015-04-26 DIAGNOSIS — Z5181 Encounter for therapeutic drug level monitoring: Secondary | ICD-10-CM

## 2015-04-26 DIAGNOSIS — Z95810 Presence of automatic (implantable) cardiac defibrillator: Secondary | ICD-10-CM | POA: Diagnosis not present

## 2015-04-26 DIAGNOSIS — I5022 Chronic systolic (congestive) heart failure: Secondary | ICD-10-CM

## 2015-04-26 DIAGNOSIS — I4891 Unspecified atrial fibrillation: Secondary | ICD-10-CM

## 2015-04-26 LAB — CUP PACEART INCLINIC DEVICE CHECK
Battery Remaining Longevity: 79 mo
Battery Voltage: 3.09 V
Brady Statistic AP VP Percent: 13.7 %
Brady Statistic AP VS Percent: 0.1 %
Brady Statistic RA Percent Paced: 13.8 %
Brady Statistic RV Percent Paced: 95.53 %
Date Time Interrogation Session: 20160808123640
HighPow Impedance: 171 Ohm
HighPow Impedance: 55 Ohm
Lead Channel Pacing Threshold Amplitude: 0.5 V
Lead Channel Pacing Threshold Amplitude: 0.75 V
Lead Channel Pacing Threshold Pulse Width: 0.6 ms
Lead Channel Sensing Intrinsic Amplitude: 13.25 mV
Lead Channel Sensing Intrinsic Amplitude: 2.875 mV
Lead Channel Setting Pacing Amplitude: 3.25 V
Lead Channel Setting Pacing Amplitude: 3.5 V
Lead Channel Setting Pacing Pulse Width: 0.4 ms
Lead Channel Setting Sensing Sensitivity: 0.3 mV
MDC IDC MSMT LEADCHNL LV PACING THRESHOLD AMPLITUDE: 1 V
MDC IDC MSMT LEADCHNL RA IMPEDANCE VALUE: 513 Ohm
MDC IDC MSMT LEADCHNL RA PACING THRESHOLD PULSEWIDTH: 0.4 ms
MDC IDC MSMT LEADCHNL RV IMPEDANCE VALUE: 418 Ohm
MDC IDC MSMT LEADCHNL RV PACING THRESHOLD PULSEWIDTH: 0.4 ms
MDC IDC SET LEADCHNL LV PACING PULSEWIDTH: 0.6 ms
MDC IDC SET LEADCHNL RV PACING AMPLITUDE: 3.5 V
MDC IDC SET ZONE DETECTION INTERVAL: 250 ms
MDC IDC SET ZONE DETECTION INTERVAL: 450 ms
MDC IDC STAT BRADY AS VP PERCENT: 84.42 %
MDC IDC STAT BRADY AS VS PERCENT: 1.79 %
Zone Setting Detection Interval: 300 ms
Zone Setting Detection Interval: 350 ms

## 2015-04-26 LAB — POCT INR: INR: 2.7

## 2015-04-26 NOTE — Progress Notes (Signed)
Wound check appointment. Steri-strips removed. Wound without redness or edema. Incision edges approximated, wound well healed. Normal device function. Thresholds, sensing, and impedances consistent with implant measurements. Device programmed at 3.5V for extra safety margin until 3 month visit. Histogram distribution appropriate for patient and level of activity. No mode switches or ventricular arrhythmias noted. 97.6% BiV pacing, 1.9% VSR pacing. Patient educated about wound care, arm mobility, lifting restrictions, shock plan. ROV with SK 07-22-15 at 1:45pm.

## 2015-05-03 ENCOUNTER — Inpatient Hospital Stay (HOSPITAL_COMMUNITY): Admit: 2015-05-03 | Payer: 59

## 2015-05-03 ENCOUNTER — Ambulatory Visit (INDEPENDENT_AMBULATORY_CARE_PROVIDER_SITE_OTHER): Payer: 59

## 2015-05-03 DIAGNOSIS — I4891 Unspecified atrial fibrillation: Secondary | ICD-10-CM

## 2015-05-03 DIAGNOSIS — I472 Ventricular tachycardia: Secondary | ICD-10-CM | POA: Diagnosis not present

## 2015-05-03 DIAGNOSIS — I4729 Other ventricular tachycardia: Secondary | ICD-10-CM

## 2015-05-03 DIAGNOSIS — Z5181 Encounter for therapeutic drug level monitoring: Secondary | ICD-10-CM | POA: Diagnosis not present

## 2015-05-03 DIAGNOSIS — Z95810 Presence of automatic (implantable) cardiac defibrillator: Secondary | ICD-10-CM

## 2015-05-03 LAB — POCT INR: INR: 4

## 2015-05-10 ENCOUNTER — Ambulatory Visit (INDEPENDENT_AMBULATORY_CARE_PROVIDER_SITE_OTHER): Payer: 59 | Admitting: *Deleted

## 2015-05-10 DIAGNOSIS — I472 Ventricular tachycardia, unspecified: Secondary | ICD-10-CM

## 2015-05-10 DIAGNOSIS — Z95810 Presence of automatic (implantable) cardiac defibrillator: Secondary | ICD-10-CM

## 2015-05-10 DIAGNOSIS — I4891 Unspecified atrial fibrillation: Secondary | ICD-10-CM | POA: Diagnosis not present

## 2015-05-10 DIAGNOSIS — I4729 Other ventricular tachycardia: Secondary | ICD-10-CM

## 2015-05-10 DIAGNOSIS — Z5181 Encounter for therapeutic drug level monitoring: Secondary | ICD-10-CM

## 2015-05-10 LAB — POCT INR: INR: 3.5

## 2015-05-16 ENCOUNTER — Other Ambulatory Visit: Payer: Self-pay | Admitting: Cardiovascular Disease

## 2015-06-11 ENCOUNTER — Ambulatory Visit (INDEPENDENT_AMBULATORY_CARE_PROVIDER_SITE_OTHER): Payer: 59 | Admitting: Cardiology

## 2015-06-11 ENCOUNTER — Ambulatory Visit (INDEPENDENT_AMBULATORY_CARE_PROVIDER_SITE_OTHER): Payer: 59 | Admitting: Pharmacist Clinician (PhC)/ Clinical Pharmacy Specialist

## 2015-06-11 ENCOUNTER — Encounter: Payer: Self-pay | Admitting: Cardiology

## 2015-06-11 VITALS — BP 106/72 | HR 73 | Ht 76.0 in | Wt 297.0 lb

## 2015-06-11 DIAGNOSIS — Z95810 Presence of automatic (implantable) cardiac defibrillator: Secondary | ICD-10-CM

## 2015-06-11 DIAGNOSIS — I4891 Unspecified atrial fibrillation: Secondary | ICD-10-CM | POA: Diagnosis not present

## 2015-06-11 DIAGNOSIS — Z79899 Other long term (current) drug therapy: Secondary | ICD-10-CM

## 2015-06-11 DIAGNOSIS — I472 Ventricular tachycardia: Secondary | ICD-10-CM | POA: Diagnosis not present

## 2015-06-11 DIAGNOSIS — Z5181 Encounter for therapeutic drug level monitoring: Secondary | ICD-10-CM | POA: Diagnosis not present

## 2015-06-11 LAB — BASIC METABOLIC PANEL
BUN: 12 mg/dL (ref 7–25)
CALCIUM: 8.9 mg/dL (ref 8.6–10.3)
CO2: 32 mmol/L — AB (ref 20–31)
CREATININE: 0.78 mg/dL (ref 0.70–1.33)
Chloride: 100 mmol/L (ref 98–110)
GLUCOSE: 88 mg/dL (ref 65–99)
Potassium: 4.2 mmol/L (ref 3.5–5.3)
Sodium: 138 mmol/L (ref 135–146)

## 2015-06-11 LAB — POCT INR: INR: 1.6

## 2015-06-11 MED ORDER — FUROSEMIDE 80 MG PO TABS: 80.0000 mg | ORAL_TABLET | Freq: Every day | ORAL | Status: DC

## 2015-06-11 NOTE — Progress Notes (Signed)
Cardiology Office Note   Date:  06/11/2015   ID:  [name] [name], DOB Apr 09, 1961, MRN [id number]  PCP:  [name], MD  Cardiologist:   [name], MD   Chief Complaint  Patient presents with  . Cardiomyopathy      History of Present Illness: [name] [name] is a 54 y.o. male who presents for follow-up after hospitalization. He was admitted with acute on chronic systolic heart failure. He's had a dilated cardiomyopathy and he was followed here but hadn't been back to the clinic in 3 years. He was admitted in July with heart failure.. Cardiac catheterization which demonstrated 30% LAD stenosis but no other disease. He was diureses. He could get an ICD. He was noted to have atrial fibrillation with rapid rate. He was started on anticoagulation. He has missed a couple of Coumadin checks it has been intermittent with this. He has followed up with his ICD check.  When he went home he his weight was 275. It has crept up to 290 but he's not having any symptoms. He denies any palpitations, presyncope or syncope. He denies any PND or orthopnea. He's had no edema. He denies any palpitations, presyncope or syncope. He's not having any chest discomfort. He's trying to watch his salt and fluid.  Past Medical History  Diagnosis Date  . HTN (hypertension)   . OSA on CPAP   . CAD (coronary artery disease)     Mild nonobstructive (Cath 09)  . Ventricular tachycardia   . Dyspnea   . Obesity, morbid   . Chronic systolic heart failure     Nonischemic CM: echo 4/12 with EF 25-30%, grade 2 diast dysfxn, mild dilated aortic root 43 mm, trivial MR, mod LAE  . Hypersomnia   . Tobacco use disorder   . H/O ETOH abuse   . CHF (congestive heart failure)     Past Surgical History  Procedure Laterality Date  . Hydrocele excision / repair    . Tumor excision Left   . Vasectomy    . Cardiac catheterization N/A 04/08/2015    Procedure: Right/Left Heart Cath and Coronary Angiography;  Surgeon:  Corky Crafts, MD;  Location: [facility] INVASIVE CV LAB;  Service: Cardiovascular;  Laterality: N/A;  . Ep implantable device N/A 04/14/2015    Procedure: BiV ICD Insertion CRT-D;  Surgeon: [name], MD;  Location: [facility] INVASIVE CV LAB;  Service: Cardiovascular;  Laterality: N/A;     Current Outpatient Prescriptions  Medication Sig Dispense Refill  . acetaminophen (TYLENOL) 325 MG tablet Take 2 tablets (650 mg total) by mouth every 4 (four) hours as needed for headache or mild pain.    Marland Kitchen amiodarone (PACERONE) 200 MG tablet TAKE ONE TABLET BY MOUTH ONCE DAILY (FOLLOW  UP  IN  COUMADIN  CLINIC) 30 tablet 0  . atorvastatin (LIPITOR) 80 MG tablet Take 1 tablet (80 mg total) by mouth daily at 6 PM. 30 tablet 6  . carvedilol (COREG) 12.5 MG tablet Take 1 tablet (12.5 mg total) by mouth 2 (two) times daily with a meal. 60 tablet 6  . glucosamine-chondroitin 500-400 MG tablet Take 2 tablets by mouth daily.    Marland Kitchen lisinopril (PRINIVIL,ZESTRIL) 2.5 MG tablet Take 1 tablet (2.5 mg total) by mouth daily. 30 tablet 6  . oxyCODONE-acetaminophen (PERCOCET/ROXICET) 5-325 MG per tablet Take 1-2 tablets by mouth every 4 (four) hours as needed for moderate pain. 15 tablet 0  . pantoprazole (PROTONIX) 40 MG tablet Take 1 tablet (40 mg  total) by mouth daily at 6 (six) AM. 30 tablet 6  . PARoxetine (PAXIL) 20 MG tablet Take 20 mg by mouth every morning.    . potassium chloride SA (KLOR-CON M20) 20 MEQ tablet Take 2 tablets (40 mEq total) by mouth daily. 30 tablet 6  . spironolactone (ALDACTONE) 25 MG tablet Take 0.5 tablets (12.5 mg total) by mouth daily. 30 tablet 6  . warfarin (COUMADIN) 10 MG tablet Take 1 tablet (10 mg total) by mouth daily. 30 tablet 6  . warfarin (COUMADIN) 2.5 MG tablet Take 1 tablet (2.5 mg total) by mouth every other day. 15 tablet 6  . furosemide (LASIX) 80 MG tablet Take 1 tablet (80 mg total) by mouth daily. 30 tablet 6   No current facility-administered medications for this visit.     Allergies:   Review of patient's allergies indicates no known allergies.    ROS:  Please see the history of present illness.   Otherwise, review of systems are positive for none.   All other systems are reviewed and negative.    PHYSICAL EXAM: VS:  BP 106/72 mmHg  Pulse 73  Ht 6\' 4"  (1.93 m)  Wt 297 lb (134.718 kg)  BMI 36.17 kg/m2 , BMI Body mass index is 36.17 kg/(m^2). GENERAL:  Well appearing HEENT:  Pupils equal round and reactive, fundi not visualized, oral mucosa unremarkable NECK:  No jugular venous distention, waveform within normal limits, carotid upstroke brisk and symmetric, no bruits, no thyromegaly LYMPHATICS:  No cervical, inguinal adenopathy LUNGS:  Clear to auscultation bilaterally BACK:  No CVA tenderness CHEST:  Unremarkable, well healed ICD pocket HEART:  PMI not displaced or sustained,S1 and S2 within normal limits, no S3, no S4, no clicks, no rubs, no murmurs ABD:  Flat, positive bowel sounds normal in frequency in pitch, no bruits, no rebound, no guarding, no midline pulsatile mass, no hepatomegaly, no splenomegaly EXT:  2 plus pulses throughout, no edema, no cyanosis no clubbing SKIN:  No rashes no nodules NEURO:  Cranial nerves II through XII grossly intact, motor grossly intact throughout PSYCH:  Cognitively intact, oriented to person place and time    EKG:  EKG is not ordered today.   Recent Labs: 04/06/2015: TSH 1.365 04/07/2015: ALT 20 04/10/2015: Magnesium 2.1 04/13/2015: B Natriuretic Peptide 571.6* 04/15/2015: Hemoglobin 14.4; Platelets 174 04/19/2015: BUN 16; Creatinine, Ser 0.88; Potassium 4.9; Sodium 134*    Wt Readings from Last 3 Encounters:  06/11/15 297 lb (134.718 kg)  04/15/15 274 lb 12.8 oz (124.648 kg)  10/26/11 319 lb 9.6 oz (144.97 kg)      Other studies Reviewed: Additional studies/ records that were reviewed today include: Hospital records. Review of the above records demonstrates:  Please see elsewhere in the note.      ASSESSMENT AND PLAN:  CM:  The patient has a dilated cardiomyopathy. I can't titrate his meds because his blood pressure will not allow. We talked about salt and fluid restriction and when necessary dosing of his diuretic. I will check a basic metabolic profile today as he's been taking 80 mg instead of 40 mg of Lasix. I will change the prescription to 80 mg daily as I think she needs this dose.  TOBACCO:  He still smoking couple of cigarettes daily and he needs to stop this completely. We have discussed this.  ETOH:   This may be the etiology of his cardiomyopathy. He understands the need for complete abstinence is he says he still drinking a 6 pack  on the weekends  ATRIAL FIB:  He understands the need to be compliant with Coumadin and follow-up. He will remain on the amiodarone. When he comes back up check a TSH and liver profile.  He has regular rhythm today and was in sinus rhythm at discharge.   Current medicines are reviewed at length with the patient today.  The patient does not have concerns regarding medicines.  The following changes have been made:  As above  Labs/ tests ordered today include:   Orders Placed This Encounter  Procedures  . Basic metabolic panel     Disposition:   FU with me in 4 months.     Signed, [name], MD  06/11/2015 11:53 AM    [facility]

## 2015-06-11 NOTE — Patient Instructions (Signed)
Your physician wants you to follow-up in: 4 Months. You will receive a reminder letter in the mail two months in advance. If you don't receive a letter, please call our office to schedule the follow-up appointment.  Your physician recommends that you return for lab work in: [facility]  Your physician has recommended you make the following change in your medication: Increase Furosemide 80 mg daily

## 2015-06-18 ENCOUNTER — Ambulatory Visit (INDEPENDENT_AMBULATORY_CARE_PROVIDER_SITE_OTHER): Payer: 59

## 2015-06-18 DIAGNOSIS — Z5181 Encounter for therapeutic drug level monitoring: Secondary | ICD-10-CM | POA: Diagnosis not present

## 2015-06-18 DIAGNOSIS — Z95810 Presence of automatic (implantable) cardiac defibrillator: Secondary | ICD-10-CM | POA: Diagnosis not present

## 2015-06-18 DIAGNOSIS — I472 Ventricular tachycardia: Secondary | ICD-10-CM

## 2015-06-18 DIAGNOSIS — I4729 Other ventricular tachycardia: Secondary | ICD-10-CM

## 2015-06-18 DIAGNOSIS — I4891 Unspecified atrial fibrillation: Secondary | ICD-10-CM

## 2015-06-18 LAB — POCT INR: INR: 1.1

## 2015-06-25 ENCOUNTER — Ambulatory Visit (INDEPENDENT_AMBULATORY_CARE_PROVIDER_SITE_OTHER): Payer: 59 | Admitting: *Deleted

## 2015-06-25 DIAGNOSIS — I472 Ventricular tachycardia: Secondary | ICD-10-CM | POA: Diagnosis not present

## 2015-06-25 DIAGNOSIS — Z5181 Encounter for therapeutic drug level monitoring: Secondary | ICD-10-CM | POA: Diagnosis not present

## 2015-06-25 DIAGNOSIS — I4729 Other ventricular tachycardia: Secondary | ICD-10-CM

## 2015-06-25 DIAGNOSIS — Z95810 Presence of automatic (implantable) cardiac defibrillator: Secondary | ICD-10-CM | POA: Diagnosis not present

## 2015-06-25 DIAGNOSIS — I4891 Unspecified atrial fibrillation: Secondary | ICD-10-CM

## 2015-06-25 LAB — POCT INR: INR: 2

## 2015-06-28 ENCOUNTER — Other Ambulatory Visit: Payer: Self-pay | Admitting: Cardiovascular Disease

## 2015-07-09 ENCOUNTER — Ambulatory Visit (INDEPENDENT_AMBULATORY_CARE_PROVIDER_SITE_OTHER): Payer: 59 | Admitting: *Deleted

## 2015-07-09 DIAGNOSIS — I472 Ventricular tachycardia: Secondary | ICD-10-CM

## 2015-07-09 DIAGNOSIS — Z95810 Presence of automatic (implantable) cardiac defibrillator: Secondary | ICD-10-CM

## 2015-07-09 DIAGNOSIS — I4891 Unspecified atrial fibrillation: Secondary | ICD-10-CM

## 2015-07-09 DIAGNOSIS — I4729 Other ventricular tachycardia: Secondary | ICD-10-CM

## 2015-07-09 DIAGNOSIS — Z5181 Encounter for therapeutic drug level monitoring: Secondary | ICD-10-CM

## 2015-07-09 LAB — POCT INR: INR: 1.2

## 2015-07-19 ENCOUNTER — Ambulatory Visit (INDEPENDENT_AMBULATORY_CARE_PROVIDER_SITE_OTHER): Payer: 59 | Admitting: Pharmacist

## 2015-07-19 DIAGNOSIS — Z5181 Encounter for therapeutic drug level monitoring: Secondary | ICD-10-CM | POA: Diagnosis not present

## 2015-07-19 DIAGNOSIS — I4891 Unspecified atrial fibrillation: Secondary | ICD-10-CM | POA: Diagnosis not present

## 2015-07-19 DIAGNOSIS — Z95810 Presence of automatic (implantable) cardiac defibrillator: Secondary | ICD-10-CM | POA: Diagnosis not present

## 2015-07-19 DIAGNOSIS — I472 Ventricular tachycardia: Secondary | ICD-10-CM | POA: Diagnosis not present

## 2015-07-19 DIAGNOSIS — I4729 Other ventricular tachycardia: Secondary | ICD-10-CM

## 2015-07-19 LAB — POCT INR: INR: 1.4

## 2015-07-22 ENCOUNTER — Ambulatory Visit (INDEPENDENT_AMBULATORY_CARE_PROVIDER_SITE_OTHER): Payer: 59 | Admitting: Internal Medicine

## 2015-07-22 ENCOUNTER — Encounter: Payer: Self-pay | Admitting: Internal Medicine

## 2015-07-22 VITALS — BP 90/60 | HR 66 | Ht 76.0 in | Wt 298.0 lb

## 2015-07-22 DIAGNOSIS — I429 Cardiomyopathy, unspecified: Secondary | ICD-10-CM

## 2015-07-22 DIAGNOSIS — I5023 Acute on chronic systolic (congestive) heart failure: Secondary | ICD-10-CM | POA: Diagnosis not present

## 2015-07-22 DIAGNOSIS — I428 Other cardiomyopathies: Secondary | ICD-10-CM

## 2015-07-22 DIAGNOSIS — I5022 Chronic systolic (congestive) heart failure: Secondary | ICD-10-CM | POA: Diagnosis not present

## 2015-07-22 DIAGNOSIS — I4891 Unspecified atrial fibrillation: Secondary | ICD-10-CM | POA: Diagnosis not present

## 2015-07-22 LAB — CUP PACEART INCLINIC DEVICE CHECK
Battery Voltage: 3.03 V
Brady Statistic AP VS Percent: 0.15 %
Brady Statistic AS VP Percent: 84.08 %
Brady Statistic RA Percent Paced: 12.81 %
Brady Statistic RV Percent Paced: 89.81 %
HighPow Impedance: 64 Ohm
Implantable Lead Implant Date: 20160727
Implantable Lead Implant Date: 20160727
Implantable Lead Implant Date: 20160727
Implantable Lead Location: 753859
Implantable Lead Model: 6935
Lead Channel Impedance Value: 304 Ohm
Lead Channel Impedance Value: 361 Ohm
Lead Channel Impedance Value: 418 Ohm
Lead Channel Impedance Value: 418 Ohm
Lead Channel Impedance Value: 532 Ohm
Lead Channel Impedance Value: 589 Ohm
Lead Channel Pacing Threshold Pulse Width: 0.4 ms
Lead Channel Sensing Intrinsic Amplitude: 15.125 mV
Lead Channel Setting Pacing Amplitude: 2.5 V
Lead Channel Setting Pacing Amplitude: 2.5 V
Lead Channel Setting Pacing Pulse Width: 0.4 ms
Lead Channel Setting Pacing Pulse Width: 0.6 ms
MDC IDC LEAD LOCATION: 753858
MDC IDC LEAD LOCATION: 753860
MDC IDC LEAD MODEL: 4598
MDC IDC MSMT BATTERY REMAINING LONGEVITY: 90 mo
MDC IDC MSMT LEADCHNL LV IMPEDANCE VALUE: 399 Ohm
MDC IDC MSMT LEADCHNL LV IMPEDANCE VALUE: 475 Ohm
MDC IDC MSMT LEADCHNL LV IMPEDANCE VALUE: 608 Ohm
MDC IDC MSMT LEADCHNL LV IMPEDANCE VALUE: 646 Ohm
MDC IDC MSMT LEADCHNL LV IMPEDANCE VALUE: 665 Ohm
MDC IDC MSMT LEADCHNL LV PACING THRESHOLD AMPLITUDE: 0.75 V
MDC IDC MSMT LEADCHNL LV PACING THRESHOLD PULSEWIDTH: 0.6 ms
MDC IDC MSMT LEADCHNL RA IMPEDANCE VALUE: 475 Ohm
MDC IDC MSMT LEADCHNL RA PACING THRESHOLD AMPLITUDE: 0.5 V
MDC IDC MSMT LEADCHNL RA PACING THRESHOLD PULSEWIDTH: 0.4 ms
MDC IDC MSMT LEADCHNL RA SENSING INTR AMPL: 3.125 mV
MDC IDC MSMT LEADCHNL RV IMPEDANCE VALUE: 456 Ohm
MDC IDC MSMT LEADCHNL RV PACING THRESHOLD AMPLITUDE: 0.5 V
MDC IDC SESS DTM: 20161103150828
MDC IDC SET LEADCHNL RA PACING AMPLITUDE: 1.5 V
MDC IDC SET LEADCHNL RV SENSING SENSITIVITY: 0.3 mV
MDC IDC STAT BRADY AP VP PERCENT: 12.66 %
MDC IDC STAT BRADY AS VS PERCENT: 3.1 %

## 2015-07-22 LAB — HEPATIC FUNCTION PANEL
ALT: 25 U/L (ref 9–46)
AST: 25 U/L (ref 10–35)
Albumin: 3.8 g/dL (ref 3.6–5.1)
Alkaline Phosphatase: 82 U/L (ref 40–115)
BILIRUBIN DIRECT: 0.4 mg/dL — AB (ref <=0.2)
BILIRUBIN INDIRECT: 1.1 mg/dL (ref 0.2–1.2)
BILIRUBIN TOTAL: 1.5 mg/dL — AB (ref 0.2–1.2)
Total Protein: 6.2 g/dL (ref 6.1–8.1)

## 2015-07-22 LAB — TSH: TSH: 1.332 u[IU]/mL (ref 0.350–4.500)

## 2015-07-22 NOTE — Progress Notes (Signed)
10 months 3 CRT-D      Patient Care Team: [name], MD as PCP - General (Family Medicine)   HPI  [name] [name] is a 54 y.o. 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 Potassium levels were low at that time and repleted although I had not thought that they were sufficiently low to explain the problem.  Records and Results Reviewed  Outside records appears his 12-lead  Past Medical History  Diagnosis Date  . HTN (hypertension)   . OSA on CPAP   . CAD (coronary artery disease)     Mild nonobstructive (Cath 09)  . Ventricular tachycardia (HCC)   . Dyspnea   . Obesity, morbid (HCC)   . Chronic systolic heart failure (HCC)     Nonischemic CM: echo 4/12 with EF 25-30%, grade 2 diast dysfxn, mild dilated aortic root 43 mm, trivial MR, mod LAE  . Hypersomnia   . Tobacco use disorder   . H/O ETOH abuse   . CHF (congestive heart failure) [facility])     Past Surgical History  Procedure Laterality Date  . Hydrocele excision / repair    . Tumor excision Left   . Vasectomy    . Cardiac catheterization N/A 04/08/2015    Procedure: Right/Left Heart Cath and Coronary Angiography;  Surgeon: Corky Crafts, MD;  Location: [facility] INVASIVE CV LAB;  Service: Cardiovascular;  Laterality: N/A;  . Ep implantable device N/A 04/14/2015    Procedure: BiV ICD Insertion CRT-D;  Surgeon: [name], MD;  Location: [facility] INVASIVE CV LAB;  Service: Cardiovascular;  Laterality: N/A;    Current Outpatient Prescriptions  Medication Sig Dispense Refill  . acetaminophen (TYLENOL) 325 MG tablet Take 2 tablets (650 mg total) by mouth every 4 (four) hours as needed for headache or mild pain.    Marland Kitchen amiodarone (PACERONE) 200  MG tablet TAKE ONE TABLET BY MOUTH ONCE DAILY 30 tablet 0  . atorvastatin (LIPITOR) 80 MG tablet Take 1 tablet (80 mg total) by mouth daily at 6 PM. 30 tablet 6  . carvedilol (COREG) 12.5 MG tablet Take 1 tablet (12.5 mg total) by mouth 2 (two) times daily with a meal. 60 tablet 6  . furosemide (LASIX) 80 MG tablet Take 1 tablet (80 mg total) by mouth daily. 30 tablet 6  . glucosamine-chondroitin 500-400 MG tablet Take 2 tablets by mouth daily.    Marland Kitchen lisinopril (PRINIVIL,ZESTRIL) 2.5 MG tablet Take 1 tablet (2.5 mg total) by mouth daily. 30 tablet 6  . oxyCODONE-acetaminophen (PERCOCET/ROXICET) 5-325 MG per tablet Take 1-2 tablets by mouth every 4 (four) hours as needed for moderate pain. 15 tablet 0  . pantoprazole (PROTONIX) 40 MG tablet Take 1 tablet (40 mg total) by mouth daily at 6 (six) AM. 30 tablet 6  . PARoxetine (PAXIL) 20 MG tablet Take 20 mg by mouth every morning.    . potassium chloride SA (KLOR-CON M20) 20 MEQ tablet Take 2 tablets (40 mEq total) by mouth daily. 30 tablet 6  . spironolactone (ALDACTONE) 25 MG tablet Take 0.5 tablets (12.5 mg total) by mouth daily. 30 tablet 6  . warfarin (COUMADIN) 10 MG tablet Take 1 tablet (10 mg total) by mouth daily. 30 tablet 6  . warfarin (COUMADIN) 2.5 MG tablet  Take 1 tablet (2.5 mg total) by mouth every other day. 15 tablet 6   No current facility-administered medications for this visit.    No Known Allergies    Review of Systems negative except from HPI and PMH  Physical Exam BP 90/60 mmHg  Pulse 66  Ht  (1.93 m)  Wt 298 lb (135.172 kg)  BMI 36.29 kg/m2 Well developed and well nourished in no acute distress HENT normal E scleral and icterus clear Neck Supple JVP flat; carotids brisk and full Clear to ausculation Device pocket well healed; without hematoma or erythema.  There is no tethering Regular rate and rhythm, no murmurs gallops or rub Soft with active bowel sounds No clubbing cyanosis Trace Edema Alert and  oriented, grossly normal motor and sensory function Skin Warm and Dry  ECG demonstrates P synchronous pacing with a negative QRS in lead 1 and negative QRS in lead V1 there were frequent ventricular ectopics with some pseudofusion   Morphology is unchanged from implant  Assessment and  Plan  Nonischemic cardiomyopathy  Congestive heart failure-chronic-systolic  Ventricular tachycardia-polymorphic  PVCs  Atrial fibrillation-paroxysmal  Amiodarone therapy for the above   The patient is status post CRT with frequent ventricular ectopy.  He is euvolemic. Still he has persistent problems exercise intolerance.  Check a chest x-ray to make sure that lead placement stable. We'll undertake AV optimization echo to see if there is anything we can do to further augment performance. He is currently taking amiodarone and still has for frequent ventricular ectopy. We should consider ablation or concomitant antiarrhythmic therapy potential data mexiletine or ranolazine. Needs surveillance laboratories for amiodarone   Potassium was normal 9/16

## 2015-07-22 NOTE — Patient Instructions (Signed)
Medication Instructions: - no changes  Labwork: - Your physician recommends that you have lab work today: Liver/ TSH  Procedures/Testing: - Your physician has recommended that you have an AV optimization echo ([employer]). During this procedure, an echocardiogram is performed to optimize the timing of your device using ultrasound and a device programmer. Changes will be made to the device settings to help the heart chambers pump more efficiently. This procedure takes approximately one hour.  - A chest x-ray takes a picture of the organs and structures inside the chest, including the heart, lungs, and blood vessels. This test can show several things, including, whether the heart is enlarges; whether fluid is building up in the lungs; and whether pacemaker / defibrillator leads are still in place.- You may go to the [facility] located on the first floor of the [facility] building at your convenience.  Follow-Up: - Remote monitoring is used to monitor your Pacemaker of ICD from home. This monitoring reduces the number of office visits required to check your device to one time per year. It allows [place] to keep an eye on the functioning of your device to ensure it is working properly. You are scheduled for a device check from home on 10/21/15. You may send your transmission at any time that day. If you have a wireless device, the transmission will be sent automatically. After your physician reviews your transmission, you will receive a postcard with your next transmission date.  - Your physician wants you to follow-up in: 9 months with Dr. Graciela Husbands. You will receive a reminder letter in the mail two months in advance. If you don't receive a letter, please call our office to schedule the follow-up appointment.  Any Additional Special Instructions Will Be Listed Below (If Applicable).

## 2015-08-06 ENCOUNTER — Other Ambulatory Visit: Payer: Self-pay | Admitting: Cardiovascular Disease

## 2015-08-06 ENCOUNTER — Ambulatory Visit (INDEPENDENT_AMBULATORY_CARE_PROVIDER_SITE_OTHER): Payer: 59 | Admitting: *Deleted

## 2015-08-06 ENCOUNTER — Other Ambulatory Visit: Payer: Self-pay

## 2015-08-06 ENCOUNTER — Ambulatory Visit
Admission: RE | Admit: 2015-08-06 | Discharge: 2015-08-06 | Disposition: A | Discharge status: Home / Self Care | Payer: 59 | Source: Ambulatory Visit | Attending: Internal Medicine | Admitting: Internal Medicine

## 2015-08-06 DIAGNOSIS — I4891 Unspecified atrial fibrillation: Secondary | ICD-10-CM

## 2015-08-06 DIAGNOSIS — I4729 Other ventricular tachycardia: Secondary | ICD-10-CM

## 2015-08-06 DIAGNOSIS — I472 Ventricular tachycardia: Secondary | ICD-10-CM | POA: Diagnosis not present

## 2015-08-06 DIAGNOSIS — I5022 Chronic systolic (congestive) heart failure: Secondary | ICD-10-CM

## 2015-08-06 DIAGNOSIS — Z5181 Encounter for therapeutic drug level monitoring: Secondary | ICD-10-CM | POA: Diagnosis not present

## 2015-08-06 DIAGNOSIS — Z95810 Presence of automatic (implantable) cardiac defibrillator: Secondary | ICD-10-CM | POA: Diagnosis not present

## 2015-08-06 LAB — POCT INR: INR: 1.2

## 2015-08-08 NOTE — Progress Notes (Signed)
AV opt done by industry today.  Echo images reviewed. aCRT algorithm in line with manual AV opt images. No changes made in programming today.  Gypsy Balsam, NP 08/09/2015 12:10 PM

## 2015-08-09 ENCOUNTER — Ambulatory Visit (INDEPENDENT_AMBULATORY_CARE_PROVIDER_SITE_OTHER): Payer: 59 | Admitting: Nurse Practitioner

## 2015-08-09 ENCOUNTER — Encounter: Payer: Self-pay | Admitting: Internal Medicine

## 2015-08-09 ENCOUNTER — Other Ambulatory Visit: Payer: Self-pay | Admitting: Internal Medicine

## 2015-08-09 ENCOUNTER — Other Ambulatory Visit: Payer: Self-pay

## 2015-08-09 ENCOUNTER — Ambulatory Visit (HOSPITAL_COMMUNITY): Payer: 59 | Attending: Cardiology

## 2015-08-09 DIAGNOSIS — I428 Other cardiomyopathies: Secondary | ICD-10-CM

## 2015-08-09 DIAGNOSIS — R29898 Other symptoms and signs involving the musculoskeletal system: Secondary | ICD-10-CM | POA: Insufficient documentation

## 2015-08-09 DIAGNOSIS — I10 Essential (primary) hypertension: Secondary | ICD-10-CM | POA: Diagnosis not present

## 2015-08-09 DIAGNOSIS — I429 Cardiomyopathy, unspecified: Secondary | ICD-10-CM | POA: Insufficient documentation

## 2015-08-09 DIAGNOSIS — I5022 Chronic systolic (congestive) heart failure: Secondary | ICD-10-CM | POA: Diagnosis not present

## 2015-08-09 DIAGNOSIS — F17200 Nicotine dependence, unspecified, uncomplicated: Secondary | ICD-10-CM | POA: Diagnosis not present

## 2015-08-09 DIAGNOSIS — I517 Cardiomegaly: Secondary | ICD-10-CM | POA: Insufficient documentation

## 2015-09-16 ENCOUNTER — Other Ambulatory Visit: Payer: Self-pay | Admitting: Cardiology

## 2015-09-16 NOTE — Telephone Encounter (Signed)
Rx has been sent to the pharmacy electronically. ° °

## 2015-10-11 ENCOUNTER — Ambulatory Visit: Payer: 59 | Admitting: Cardiology

## 2015-10-21 ENCOUNTER — Ambulatory Visit (INDEPENDENT_AMBULATORY_CARE_PROVIDER_SITE_OTHER): Payer: Medicaid Other | Admitting: *Deleted

## 2015-10-21 ENCOUNTER — Telehealth: Payer: Self-pay | Admitting: Cardiology

## 2015-10-21 DIAGNOSIS — I428 Other cardiomyopathies: Secondary | ICD-10-CM

## 2015-10-21 DIAGNOSIS — I5022 Chronic systolic (congestive) heart failure: Secondary | ICD-10-CM

## 2015-10-21 DIAGNOSIS — I429 Cardiomyopathy, unspecified: Secondary | ICD-10-CM | POA: Diagnosis not present

## 2015-10-21 NOTE — Telephone Encounter (Signed)
Spoke with pt and reminded pt of remote transmission that is due today. Pt verbalized understanding.   

## 2015-10-25 NOTE — Progress Notes (Signed)
Remote ICD transmission.   

## 2015-11-05 ENCOUNTER — Encounter: Payer: Self-pay | Admitting: Cardiology

## 2015-11-05 LAB — CUP PACEART REMOTE DEVICE CHECK
Battery Remaining Longevity: 95 mo
Battery Voltage: 3.01 V
Brady Statistic AP VP Percent: 18 %
Brady Statistic AP VS Percent: 0.42 %
Brady Statistic RV Percent Paced: 91.7 %
Date Time Interrogation Session: 20170204004353
HighPow Impedance: 60 Ohm
Implantable Lead Implant Date: 20160727
Implantable Lead Implant Date: 20160727
Implantable Lead Model: 4598
Implantable Lead Model: 6935
Lead Channel Impedance Value: 304 Ohm
Lead Channel Impedance Value: 399 Ohm
Lead Channel Impedance Value: 418 Ohm
Lead Channel Impedance Value: 418 Ohm
Lead Channel Impedance Value: 456 Ohm
Lead Channel Impedance Value: 475 Ohm
Lead Channel Pacing Threshold Amplitude: 0.5 V
Lead Channel Pacing Threshold Pulse Width: 0.4 ms
Lead Channel Sensing Intrinsic Amplitude: 14.125 mV
Lead Channel Sensing Intrinsic Amplitude: 3.125 mV
Lead Channel Setting Pacing Amplitude: 1.75 V
Lead Channel Setting Pacing Pulse Width: 0.4 ms
MDC IDC LEAD IMPLANT DT: 20160727
MDC IDC LEAD LOCATION: 753858
MDC IDC LEAD LOCATION: 753859
MDC IDC LEAD LOCATION: 753860
MDC IDC MSMT LEADCHNL LV IMPEDANCE VALUE: 342 Ohm
MDC IDC MSMT LEADCHNL LV IMPEDANCE VALUE: 532 Ohm
MDC IDC MSMT LEADCHNL LV IMPEDANCE VALUE: 589 Ohm
MDC IDC MSMT LEADCHNL LV IMPEDANCE VALUE: 608 Ohm
MDC IDC MSMT LEADCHNL LV IMPEDANCE VALUE: 608 Ohm
MDC IDC MSMT LEADCHNL LV IMPEDANCE VALUE: 646 Ohm
MDC IDC MSMT LEADCHNL LV PACING THRESHOLD AMPLITUDE: 0.75 V
MDC IDC MSMT LEADCHNL LV PACING THRESHOLD PULSEWIDTH: 0.6 ms
MDC IDC MSMT LEADCHNL RA PACING THRESHOLD AMPLITUDE: 0.5 V
MDC IDC MSMT LEADCHNL RA PACING THRESHOLD PULSEWIDTH: 0.4 ms
MDC IDC MSMT LEADCHNL RA SENSING INTR AMPL: 3.125 mV
MDC IDC MSMT LEADCHNL RV IMPEDANCE VALUE: 399 Ohm
MDC IDC MSMT LEADCHNL RV SENSING INTR AMPL: 14.125 mV
MDC IDC SET LEADCHNL LV PACING PULSEWIDTH: 0.6 ms
MDC IDC SET LEADCHNL RA PACING AMPLITUDE: 1.5 V
MDC IDC SET LEADCHNL RV PACING AMPLITUDE: 2 V
MDC IDC SET LEADCHNL RV SENSING SENSITIVITY: 0.3 mV
MDC IDC STAT BRADY AS VP PERCENT: 76.93 %
MDC IDC STAT BRADY AS VS PERCENT: 4.66 %
MDC IDC STAT BRADY RA PERCENT PACED: 18.42 %

## 2015-11-22 ENCOUNTER — Encounter: Payer: Self-pay | Admitting: Cardiology

## 2015-11-22 ENCOUNTER — Ambulatory Visit (INDEPENDENT_AMBULATORY_CARE_PROVIDER_SITE_OTHER): Payer: Medicaid Other | Admitting: Cardiology

## 2015-11-22 VITALS — BP 110/72 | HR 72 | Ht 76.0 in | Wt 309.4 lb

## 2015-11-22 DIAGNOSIS — I5022 Chronic systolic (congestive) heart failure: Secondary | ICD-10-CM | POA: Diagnosis not present

## 2015-11-22 NOTE — Patient Instructions (Signed)
Your physician recommends that you continue on your current medications as directed. Please refer to the Current Medication list given to you today.  Dr Antoine Poche recommends that you schedule a follow-up appointment in 4 months.  If you need a refill on your cardiac medications before your next appointment, please call your pharmacy.

## 2015-11-22 NOTE — Progress Notes (Signed)
Cardiology Office Note   Date:  11/22/2015   ID:  [name] [name], DOB [birth year], MRN [id number]  PCP:  [name], MD  Cardiologist:   [name], MD   No chief complaint on file.     History of Present Illness: [name] [name] is a 55 y.o. male who presents for follow-up of chronic systolic heart failure. He's had a dilated cardiomyopathy.  Cardiac catheterization in July of last year demonstrated 30% LAD stenosis but no other disease. He is status post ICD.  Since I last saw him he's had no new hospital or emergency room visits. He did see Dr. Graciela Husbands and had echocardiography guided optimization of his defibrillator.  He denies any new cardiovascular symptoms. He's had chronic fatigue.  He says his CPAP machine is not working very well however. He denies any new acute shortness of breath. He's had no palpitations, presyncope or syncope. He's had no PND or orthopnea. He has had about a 10 pound weight gain since his son is now doing the cooking more than he is eating healthier.  Past Medical History  Diagnosis Date  . HTN (hypertension)   . OSA on CPAP   . CAD (coronary artery disease)     Mild nonobstructive (Cath 09)  . Ventricular tachycardia (HCC)   . Dyspnea   . Obesity, morbid (HCC)   . Chronic systolic heart failure (HCC)     Nonischemic CM: echo 4/12 with EF 25-30%, grade 2 diast dysfxn, mild dilated aortic root 43 mm, trivial MR, mod LAE  . Hypersomnia   . Tobacco use disorder   . H/O ETOH abuse   . CHF (congestive heart failure) [facility]     Past Surgical History  Procedure Laterality Date  . Hydrocele excision / repair    . Tumor excision Left   . Vasectomy    . Cardiac catheterization N/A 04/08/2015    Procedure: Right/Left Heart Cath and Coronary Angiography;  Surgeon: Corky Crafts, MD;  Location: [facility] INVASIVE CV LAB;  Service: Cardiovascular;  Laterality: N/A;  . Ep implantable device N/A 04/14/2015    Procedure: BiV ICD Insertion CRT-D;  Surgeon:  [name], MD;  Location: [facility] INVASIVE CV LAB;  Service: Cardiovascular;  Laterality: N/A;     Current Outpatient Prescriptions  Medication Sig Dispense Refill  . acetaminophen (TYLENOL) 325 MG tablet Take 2 tablets (650 mg total) by mouth every 4 (four) hours as needed for headache or mild pain.    Marland Kitchen amiodarone (PACERONE) 200 MG tablet TAKE ONE TABLET BY MOUTH ONCE DAILY 30 tablet 11  . atorvastatin (LIPITOR) 80 MG tablet Take 1 tablet (80 mg total) by mouth daily at 6 PM. 30 tablet 6  . carvedilol (COREG) 12.5 MG tablet Take 1 tablet (12.5 mg total) by mouth 2 (two) times daily with a meal. 60 tablet 6  . furosemide (LASIX) 80 MG tablet Take 1 tablet (80 mg total) by mouth daily. 30 tablet 6  . glucosamine-chondroitin 500-400 MG tablet Take 2 tablets by mouth daily.    Marland Kitchen KLOR-CON M20 20 MEQ tablet TAKE TWO TABLETS BY MOUTH ONCE DAILY 60 tablet 6  . lisinopril (PRINIVIL,ZESTRIL) 2.5 MG tablet Take 1 tablet (2.5 mg total) by mouth daily. 30 tablet 6  . pantoprazole (PROTONIX) 40 MG tablet Take 1 tablet (40 mg total) by mouth daily at 6 (six) AM. 30 tablet 6  . PARoxetine (PAXIL) 20 MG tablet Take 20 mg by mouth every morning.    Marland Kitchen  spironolactone (ALDACTONE) 25 MG tablet Take 0.5 tablets (12.5 mg total) by mouth daily. 30 tablet 6  . warfarin (COUMADIN) 10 MG tablet Take 1 tablet (10 mg total) by mouth daily. 30 tablet 6  . warfarin (COUMADIN) 2.5 MG tablet Take 1 tablet (2.5 mg total) by mouth every other day. 15 tablet 6   No current facility-administered medications for this visit.    Allergies:   Review of patient's allergies indicates no known allergies.    ROS:  Please see the history of present illness.   Otherwise, review of systems are positive for none.   All other systems are reviewed and negative.    PHYSICAL EXAM: VS:  BP 110/72 mmHg  Pulse 72  Ht  (1.93 m)  Wt 309 lb 6 oz (140.332 kg)  BMI 37.67 kg/m2 , BMI Body mass index is 37.67 kg/(m^2). GENERAL:  Well  appearing HEENT:  Pupils equal round and reactive, fundi not visualized, oral mucosa unremarkable NECK:  No jugular venous distention, waveform within normal limits, carotid upstroke brisk and symmetric, no bruits, no thyromegaly LYMPHATICS:  No cervical, inguinal adenopathy LUNGS:  Clear to auscultation bilaterally BACK:  No CVA tenderness CHEST:  Unremarkable, well healed ICD pocket HEART:  PMI not displaced or sustained,S1 and S2 within normal limits, no S3, no S4, no clicks, no rubs, no murmurs ABD:  Flat, positive bowel sounds normal in frequency in pitch, no bruits, no rebound, no guarding, no midline pulsatile mass, no hepatomegaly, no splenomegaly EXT:  2 plus pulses throughout, no edema, no cyanosis no clubbing SKIN:  No rashes no nodules NEURO:  Cranial nerves II through XII grossly intact, motor grossly intact throughout PSYCH:  Cognitively intact, oriented to person place and time    EKG:  EKG is ordered today. Sinus rhythm, rate 73, 100% ventricular capture.   Recent Labs: 04/10/2015: Magnesium 2.1 04/13/2015: B Natriuretic Peptide 571.6* 04/15/2015: Hemoglobin 14.4; Platelets 174 06/11/2015: BUN 12; Creat 0.78; Potassium 4.2; Sodium 138 07/22/2015: ALT 25; TSH 1.332    Wt Readings from Last 3 Encounters:  11/22/15 309 lb 6 oz (140.332 kg)  07/22/15 298 lb (135.172 kg)  06/11/15 297 lb (134.718 kg)      Other studies Reviewed: Additional studies/ records that were reviewed today include: Hospital records. Review of the above records demonstrates:  Please see elsewhere in the note.     ASSESSMENT AND PLAN:  CM:  He seems to be euvolemic. No change in therapy is planned at this point.  We did discuss possibly reducing his diuretic dose he tolerates 40 mg.  TOBACCO:  He still smoking couple of cigarettes daily and he needs to stop this completely. We have discussed this.  ETOH:   This may be the etiology of his cardiomyopathy. He says he is still rarely drinking and  he understands the need for complete abstinence.    ATRIAL FIB:  He understands the need to be compliant with Coumadin and follow-up. He will remain on the amiodarone. When he comes back up check a TSH and liver profile.  He has regular rhythm today and was in sinus rhythm at discharge.  [name] [name] has a CHA2DS2 - VASc score of  with 2 with a risk of stroke of 2.2%.  SLEEP APNEA:  He has documented sleep apnea and does have a device but currently it is not working. He was told he needed a prescription for a new device. We will arrange this and follow-up with Dr. Tresa Endo.  Current medicines are reviewed at length with the patient today.  The patient does not have concerns regarding medicines.  The following changes have been made:  As above  Labs/ tests ordered today include:   No orders of the defined types were placed in this encounter.     Disposition:   FU with me in 4 months.     Signed, [name], MD  11/22/2015 10:30 AM    [facility]

## 2015-11-25 ENCOUNTER — Telehealth: Payer: Self-pay | Admitting: Cardiology

## 2015-11-25 NOTE — Telephone Encounter (Signed)
New MEssage  Pt was seen Monday 3/6- wanted to further discuss CPAP machine. Please call back and discuss.

## 2015-11-29 NOTE — Telephone Encounter (Signed)
Pt calling back - wants to check on how he can get new CPAP through [facility], whether a new sleep study is needed. Per Dr. Jenene Slicker note from 3/6 this was to be discussed w/ Dr. Tresa Endo.

## 2015-11-30 NOTE — Telephone Encounter (Signed)
Referral sent to [facility] for new BIPAP machine. Patient notified. He requested a change from [facility]. States he does not like their service.

## 2015-12-06 NOTE — Telephone Encounter (Signed)
ok 

## 2015-12-15 ENCOUNTER — Telehealth: Payer: Self-pay | Admitting: Cardiology

## 2015-12-15 NOTE — Telephone Encounter (Signed)
Message sent to [facility] in Sleep Clinic.

## 2015-12-15 NOTE — Telephone Encounter (Signed)
New Message  Pt requested to speak w/ RN to discuss CPAP machine. Please call back and discuss.

## 2015-12-17 NOTE — Telephone Encounter (Signed)
This is a patient of Dr. Tresa Endo who was apparently wanting to be switched from [facility] to [facility].  There is a note that Burna Mortimer placed the referral, and it's been 2 1/2 weeks and the patient has not heard from anyone about getting set up. I have called [facility] to see if they can call the patient to discuss setup and let him know the status of the order. [facility] states that they have not received anything from Dr. Landry Dyke office.  I will route this message to Burna Mortimer so that whatever needs to be done can be sent again.

## 2015-12-18 ENCOUNTER — Other Ambulatory Visit: Payer: Self-pay | Admitting: Cardiology

## 2015-12-20 NOTE — Telephone Encounter (Signed)
Called and left message apologizing for any inconvenience. Spoke with [name] [name] at [employer] and it was a oversight on her part. She did not see the request for the machine. She thought that because he was a transfer from [facility], that the order was for supplies only. She will contact Medicaid today to start the process.

## 2015-12-20 NOTE — Telephone Encounter (Signed)
Rx(s) sent to pharmacy electronically.  

## 2015-12-20 NOTE — Telephone Encounter (Signed)
I called [facility] to see if they received the order that was faxed over to them on 11/30/15. Jasmine December informs me that she did receive the order but it was for supplies only. I had her to re read the order. There's also request on the order for a new BIPAP machine. She apologized for the oversight and says that she will call medicaid to see if there is a authorization on file. She will call me back with status.

## 2016-01-19 ENCOUNTER — Ambulatory Visit (INDEPENDENT_AMBULATORY_CARE_PROVIDER_SITE_OTHER): Payer: Medicaid Other | Admitting: *Deleted

## 2016-01-19 DIAGNOSIS — I5022 Chronic systolic (congestive) heart failure: Secondary | ICD-10-CM | POA: Diagnosis not present

## 2016-01-19 DIAGNOSIS — I429 Cardiomyopathy, unspecified: Secondary | ICD-10-CM

## 2016-01-19 DIAGNOSIS — I428 Other cardiomyopathies: Secondary | ICD-10-CM

## 2016-01-20 NOTE — Progress Notes (Signed)
Remote ICD transmission.   

## 2016-02-03 ENCOUNTER — Other Ambulatory Visit: Payer: Self-pay | Admitting: Cardiology

## 2016-02-25 ENCOUNTER — Encounter: Payer: Self-pay | Admitting: Cardiology

## 2016-02-28 LAB — CUP PACEART REMOTE DEVICE CHECK
Battery Remaining Longevity: 93 mo
Battery Voltage: 3 V
Brady Statistic AS VS Percent: 11.21 %
HIGH POWER IMPEDANCE MEASURED VALUE: 71 Ohm
Implantable Lead Implant Date: 20160727
Implantable Lead Location: 753858
Implantable Lead Location: 753860
Implantable Lead Model: 4598
Implantable Lead Model: 6935
Lead Channel Impedance Value: 418 Ohm
Lead Channel Impedance Value: 456 Ohm
Lead Channel Impedance Value: 475 Ohm
Lead Channel Impedance Value: 589 Ohm
Lead Channel Impedance Value: 589 Ohm
Lead Channel Impedance Value: 589 Ohm
Lead Channel Impedance Value: 646 Ohm
Lead Channel Pacing Threshold Amplitude: 0.5 V
Lead Channel Pacing Threshold Amplitude: 0.75 V
Lead Channel Pacing Threshold Pulse Width: 0.6 ms
Lead Channel Sensing Intrinsic Amplitude: 2.625 mV
Lead Channel Setting Pacing Amplitude: 2 V
Lead Channel Setting Pacing Pulse Width: 0.4 ms
MDC IDC LEAD IMPLANT DT: 20160727
MDC IDC LEAD IMPLANT DT: 20160727
MDC IDC LEAD LOCATION: 753859
MDC IDC MSMT LEADCHNL LV IMPEDANCE VALUE: 342 Ohm
MDC IDC MSMT LEADCHNL LV IMPEDANCE VALUE: 399 Ohm
MDC IDC MSMT LEADCHNL LV IMPEDANCE VALUE: 399 Ohm
MDC IDC MSMT LEADCHNL LV IMPEDANCE VALUE: 399 Ohm
MDC IDC MSMT LEADCHNL LV IMPEDANCE VALUE: 551 Ohm
MDC IDC MSMT LEADCHNL RA IMPEDANCE VALUE: 456 Ohm
MDC IDC MSMT LEADCHNL RA PACING THRESHOLD PULSEWIDTH: 0.4 ms
MDC IDC MSMT LEADCHNL RA SENSING INTR AMPL: 2.625 mV
MDC IDC MSMT LEADCHNL RV PACING THRESHOLD AMPLITUDE: 0.5 V
MDC IDC MSMT LEADCHNL RV PACING THRESHOLD PULSEWIDTH: 0.4 ms
MDC IDC MSMT LEADCHNL RV SENSING INTR AMPL: 16.375 mV
MDC IDC MSMT LEADCHNL RV SENSING INTR AMPL: 16.375 mV
MDC IDC SESS DTM: 20170503092704
MDC IDC SET LEADCHNL LV PACING AMPLITUDE: 1.75 V
MDC IDC SET LEADCHNL LV PACING PULSEWIDTH: 0.6 ms
MDC IDC SET LEADCHNL RA PACING AMPLITUDE: 1.5 V
MDC IDC SET LEADCHNL RV SENSING SENSITIVITY: 0.3 mV
MDC IDC STAT BRADY AP VP PERCENT: 19.71 %
MDC IDC STAT BRADY AP VS PERCENT: 0.13 %
MDC IDC STAT BRADY AS VP PERCENT: 68.95 %
MDC IDC STAT BRADY RA PERCENT PACED: 19.84 %
MDC IDC STAT BRADY RV PERCENT PACED: 87.15 %

## 2016-03-06 ENCOUNTER — Other Ambulatory Visit: Payer: Self-pay | Admitting: Cardiology

## 2016-03-06 NOTE — Telephone Encounter (Signed)
Rx(s) sent to pharmacy electronically.  

## 2016-03-29 ENCOUNTER — Ambulatory Visit (INDEPENDENT_AMBULATORY_CARE_PROVIDER_SITE_OTHER): Payer: Medicaid Other | Admitting: Cardiovascular Disease

## 2016-03-29 ENCOUNTER — Encounter: Payer: Self-pay | Admitting: Cardiovascular Disease

## 2016-03-29 VITALS — BP 129/80 | HR 84 | Ht 76.0 in | Wt 313.6 lb

## 2016-03-29 DIAGNOSIS — I2510 Atherosclerotic heart disease of native coronary artery without angina pectoris: Secondary | ICD-10-CM | POA: Diagnosis not present

## 2016-03-29 DIAGNOSIS — I429 Cardiomyopathy, unspecified: Secondary | ICD-10-CM

## 2016-03-29 DIAGNOSIS — I5022 Chronic systolic (congestive) heart failure: Secondary | ICD-10-CM

## 2016-03-29 DIAGNOSIS — G4733 Obstructive sleep apnea (adult) (pediatric): Secondary | ICD-10-CM | POA: Diagnosis not present

## 2016-03-29 DIAGNOSIS — I428 Other cardiomyopathies: Secondary | ICD-10-CM

## 2016-03-29 NOTE — Patient Instructions (Signed)
Your physician recommends that you schedule a follow-up appointment as needed with Dr Tresa Endo for sleep.otherwise continue to communicate with [facility] for your CPAP needs.

## 2016-03-30 ENCOUNTER — Encounter: Payer: Self-pay | Admitting: Cardiovascular Disease

## 2016-03-30 NOTE — Progress Notes (Signed)
Patient ID: Ralene Cork, male   DOB: 22-Jan-1961, 55 y.o.   MRN: [id number]   Primary M.D.: Dr. Arelia Sneddon Primary cardiologist: Dr. Percival Spanish  HPI: Jadakiss Barish is a 55 y.o. male who presents for sleep clinic evaluation after receiving a replacement CPAP unit.  Mr. Bilyeu has a history of chronic systolic heart failure, cardiomyopathy, status post ICD, and mild CAD.  He has a history of obstructive sleep apnea and in September 2009 underwent a sleep study which was interpreted by Dr. Annamaria Boots and revealed severe obstructive sleep apnea with AHI of 109.4 per hour with loud snoring and significant oxygen desaturation.  At that time, he was started on BiPAP therapy at 21/18 and was using a full face mask.  Only had difficulty with his old machine and had a replacement CPAP  AirSense 10 AutoSet unit on 01/11/2016.  He is now using [employer] as his DME company.  A download was obtained from 02/21/2016 through 03/21/2016.  He is compliant with 100% of usage stays.  Usage greater than 4 hours per night was 83%.  He was only averaging 5 hours and 27 minutes of use per night.  He is set at an auto pressure with a 5 cm minimal pressure and maximum pressure up to 20 cm.  His 95th percentile pressure is only 10.6 with a maximum 11.8.  He has been using a [employer] full face mask, large size.  He has been noncompliant with watching his mask.  He has a significant mask leak with a maximum of 118.6 and 95th percentile at 99.8 which is significant.  His AHI with this high mask leak was 13.7 and undoubtedly reflects some of the leak.  He feels that he is tolerating this CPAP unit better than his previous unit.  The patient has had significant weight fluctuation admits to a 50 pound weight loss over a year.  He continues to have daytime sleepiness as noted by his Epworth score noted below.  Epworth Sleepiness Scale: Situation   Chance of Dozing/Sleeping (0 = never , 1 = slight chance , 2 = moderate chance , 3 = high  chance )   sitting and reading 3   watching TV 3   sitting inactive in a public place 2   being a passenger in a motor vehicle for an hour or more 3   lying down in the afternoon 3   sitting and talking to someone 0   sitting quietly after lunch (no alcohol) 1   while stopped for a few minutes in traffic as the driver 0   Total Score  15   He presents to establish sleep care with me in follow-up of his recent placement unit.  States that he typically goes to bed approximately 10 PM and is always up by 6 AM.  Past Medical History  Diagnosis Date  . HTN (hypertension)   . OSA on CPAP   . CAD (coronary artery disease)     Mild nonobstructive (Cath 09)  . Ventricular tachycardia ([place])   . Dyspnea   . Obesity, morbid ([place])   . Chronic systolic heart failure (HCC)     Nonischemic CM: echo 4/12 with EF 25-30%, grade 2 diast dysfxn, mild dilated aortic root 43 mm, trivial MR, mod LAE  . Hypersomnia   . Tobacco use disorder   . H/O ETOH abuse     Past Surgical History  Procedure Laterality Date  . Hydrocele excision / repair    .  Tumor excision Left   . Vasectomy    . Cardiac catheterization N/A 04/08/2015    Procedure: Right/Left Heart Cath and Coronary Angiography;  Surgeon: Jettie Booze, MD;  Location: [place] CV LAB;  Service: Cardiovascular;  Laterality: N/A;  . Ep implantable device N/A 04/14/2015    Procedure: BiV ICD Insertion CRT-D;  Surgeon: Deboraha Sprang, MD;  Location: [place] CV LAB;  Service: Cardiovascular;  Laterality: N/A;    No Known Allergies  Current Outpatient Prescriptions  Medication Sig Dispense Refill  . acetaminophen (TYLENOL) 325 MG tablet Take 2 tablets (650 mg total) by mouth every 4 (four) hours as needed for headache or mild pain.    Marland Kitchen amiodarone (PACERONE) 200 MG tablet TAKE ONE TABLET BY MOUTH ONCE DAILY 30 tablet 11  . atorvastatin (LIPITOR) 80 MG tablet TAKE ONE TABLET BY MOUTH ONCE DAILY AT 6 PM 30 tablet 6  . carvedilol (COREG)  12.5 MG tablet Take 1 tablet (12.5 mg total) by mouth 2 (two) times daily with a meal. 60 tablet 6  . furosemide (LASIX) 80 MG tablet Take 1 tablet (80 mg total) by mouth daily. 30 tablet 6  . glucosamine-chondroitin 500-400 MG tablet Take 2 tablets by mouth daily.    Marland Kitchen KLOR-CON M20 20 MEQ tablet TAKE TWO TABLETS BY MOUTH ONCE DAILY 60 tablet 6  . lisinopril (PRINIVIL,ZESTRIL) 2.5 MG tablet Take 1 tablet (2.5 mg total) by mouth daily. 30 tablet 6  . pantoprazole (PROTONIX) 40 MG tablet TAKE ONE TABLET BY MOUTH ONCE DAILY AT  6  AM 30 tablet 6  . PARoxetine (PAXIL) 20 MG tablet Take 20 mg by mouth every morning.    Marland Kitchen spironolactone (ALDACTONE) 25 MG tablet Take 0.5 tablets (12.5 mg total) by mouth daily. 30 tablet 6  . warfarin (COUMADIN) 10 MG tablet Take 1 tablet (10 mg total) by mouth daily. 30 tablet 6  . warfarin (COUMADIN) 2.5 MG tablet TAKE ONE TABLET BY MOUTH EVERY OTHER DAY 30 tablet 1   No current facility-administered medications for this visit.    Social History   Social History  . Marital Status: Single    Spouse Name: N/A  . Number of Children: 2  . Years of Education: N/A   Occupational History  . scrap metal    Social History Main Topics  . Smoking status: Current Some Day Smoker -- 0.50 packs/day for 4 years    Types: Cigarettes  . Smokeless tobacco: Never Used  . Alcohol Use: 3.6 oz/week    6 Cans of beer per week  . Drug Use: Yes    Special: Codeine, Marijuana     Comment: has discontinued  . Sexual Activity: Not on file   Other Topics Concern  . Not on file   Social History Narrative   Additional social history is notable that he is divorced and has 3 children who are currently age 53, 18, and 58.  Family History  Problem Relation Age of Onset  . Coronary artery disease    . Heart Problems Mother     CABG AGE 74  . Healthy Father     AGE 62  . Healthy Brother     AGE 29  . Healthy Sister     AGE 52  . Healthy Sister     AGE 83  . Healthy Son    . Healthy Son   . Healthy Daughter      ROS General: Negative; No fevers, chills, or night  sweats HEENT: Negative; No changes in vision or hearing, sinus congestion, difficulty swallowing Pulmonary: Negative; No cough, wheezing, shortness of breath, hemoptysis Cardiovascular: Positive for cardiomyopathy, status post ICD, and chronic systolic heart failure GI: Negative; No nausea, vomiting, diarrhea, or abdominal pain GU: Negative; No dysuria, hematuria, or difficulty voiding Musculoskeletal: Negative; no myalgias, joint pain, or weakness Hematologic: Negative; no easy bruising, bleeding Endocrine: Negative; no heat/cold intolerance Neuro: Negative; no changes in balance, headaches Skin: Negative; No rashes or skin lesions Psychiatric: Negative; No behavioral problems, depression Sleep: As of her severe OSA, now on CPAP therapy, previously had been on BiPAP; positive for daytime sleepiness, hypersomnolence; nobruxism, restless legs, hypnogognic hallucinations, no cataplexy   Physical Exam BP 129/80 mmHg  Pulse 84  Ht '6\' 4"'  (1.93 m)  Wt 313 lb 9.6 oz (142.248 kg)  BMI 38.19 kg/m2  Wt Readings from Last 3 Encounters:  03/29/16 313 lb 9.6 oz (142.248 kg)  11/22/15 309 lb 6 oz (140.332 kg)  07/22/15 298 lb (135.172 kg)   General: Alert, oriented, no distress.  Skin: normal turgor, no rashes HEENT: Normocephalic, atraumatic. Pupils round and reactive; sclera anicteric; extraocular muscles intact; Fundi Without hemorrhages or exudates Nose without nasal septal hypertrophy Mouth/Parynx benign; Mallinpatti scale 3 Neck: No JVD, no carotid briuts Lungs: clear to ausculatation and percussion; no wheezing or rales  Chest wall: No tenderness to palpation Heart: RRR, s1 s2 normal , faint 1/6 systolic murmur.  No S3 gallop. Abdomen: soft, nontender; no hepatosplenomehaly, BS+; abdominal aorta nontender and not dilated by palpation. Back: No CVA tenderness Pulses 2+ Extremities: no  clubbinbg cyanosis or edema, Homan's sign negative  Neurologic: grossly nonfocal; cranial nerves intact. Psychological: Normal affect and mood.  ECG (independently read by me): Not done today  LABS:  BMP Latest Ref Rng 06/11/2015 04/19/2015 04/15/2015  Glucose 65 - 99 mg/dL 88 128(H) 96  BUN 7 - 25 mg/dL '12 16 20  ' Creatinine 0.70 - 1.33 mg/dL 0.78 0.88 0.84  Sodium 135 - 146 mmol/L 138 134(L) 132(L)  Potassium 3.5 - 5.3 mmol/L 4.2 4.9 4.5  Chloride 98 - 110 mmol/L 100 98 97(L)  CO2 20 - 31 mmol/L 32(H) 28 29  Calcium 8.6 - 10.3 mg/dL 8.9 9.2 8.9     Hepatic Function Latest Ref Rng 07/22/2015 04/07/2015 12/10/2007  Total Protein 6.1 - 8.1 g/dL 6.2 6.0(L) 6.5  Albumin 3.6 - 5.1 g/dL 3.8 3.6 3.7  AST 10 - 35 U/L '25 19 18  ' ALT 9 - 46 U/L '25 20 23  ' Alk Phosphatase 40 - 115 U/L 82 75 89  Total Bilirubin 0.2 - 1.2 mg/dL 1.5(H) 2.2(H) 1.5(H)  Bilirubin, Direct <=0.2 mg/dL 0.4(H) - -     CBC Latest Ref Rng 04/15/2015 04/14/2015 04/13/2015  WBC 4.0 - 10.5 K/uL 6.0 5.6 5.9  Hemoglobin 13.0 - 17.0 g/dL 14.4 14.6 14.0  Hematocrit 39.0 - 52.0 % 41.0 41.8 40.8  Platelets 150 - 400 K/uL 174 173 159     Lipid Panel     Component Value Date/Time   CHOL  12/11/2007 0128    136        ATP III CLASSIFICATION:  <200     mg/dL   Desirable  200-239  mg/dL   Borderline High  >=240    mg/dL   High   TRIG 115 12/11/2007 0128   HDL 23* 12/11/2007 0128   CHOLHDL 5.9 12/11/2007 0128   VLDL 23 12/11/2007 0128   [place]  12/11/2007 0128  90        Total Cholesterol/HDL:CHD Risk Coronary Heart Disease Risk Table                     Men   Women  1/2 Average Risk   3.4   3.3     RADIOLOGY: No results found.    ASSESSMENT AND PLAN: Mr. Canning has a history of a dilated cardiomyopathy, status post ICD implantation and has chronic systolic heart failure.  He been documented to have severe obstructive sleep apnea since 2009.  He previously was treated with BiPAP therapy.  Most recently, he received  a new CPAP replacement unit and is requiring pressures only at a proximally 10-11 cm water pressure.  He is tolerating his new CPAP unit well.  His AHI is elevated at 13.7, but I suspect this may be due to significant mask leak.  Upon questioning, he has not been cleansing the mask and undoubtedly this may be contributing to his mask leak.  In the office today.  We did attempt to optimize mask placement.  He is meeting compliance standards per Medicare.  His AHI continues to be elevated, but he is only using the mask for 5 hours and 27 minutes per night which is inadequate sleep duration.  Discussed with him the importance of sleeping at least 7-8 hours per night.  His CPAP unit is set in the auto mode.  A new download will be obtained in 4 weeks to see if his mask leak and significant improved and AHI as well.  He is not having any central apneas.  We'll contact him regarding adjustments based on his follow-up download.  I will see him in one year for follow-up sleep evaluation or problems arise.   Troy Sine, MD, [facility]  03/30/2016 6:04 PM

## 2016-04-13 ENCOUNTER — Encounter: Payer: Self-pay | Admitting: *Deleted

## 2016-04-13 ENCOUNTER — Ambulatory Visit (INDEPENDENT_AMBULATORY_CARE_PROVIDER_SITE_OTHER): Payer: Medicaid Other | Admitting: Cardiology

## 2016-04-13 VITALS — BP 124/64 | HR 76 | Ht 76.0 in | Wt 300.0 lb

## 2016-04-13 DIAGNOSIS — R0602 Shortness of breath: Secondary | ICD-10-CM

## 2016-04-13 DIAGNOSIS — Z79899 Other long term (current) drug therapy: Secondary | ICD-10-CM

## 2016-04-13 DIAGNOSIS — I429 Cardiomyopathy, unspecified: Secondary | ICD-10-CM

## 2016-04-13 DIAGNOSIS — I4891 Unspecified atrial fibrillation: Secondary | ICD-10-CM

## 2016-04-13 DIAGNOSIS — R5383 Other fatigue: Secondary | ICD-10-CM

## 2016-04-13 DIAGNOSIS — I428 Other cardiomyopathies: Secondary | ICD-10-CM

## 2016-04-13 MED ORDER — LISINOPRIL 2.5 MG PO TABS: 2.5000 mg | ORAL_TABLET | Freq: Two times a day (BID) | ORAL | 6 refills | Status: DC

## 2016-04-13 NOTE — Patient Instructions (Signed)
Medication Instructions:  INCREASE Lisinopril 2.5 mg twice a day  Labwork: BNP, TSH and CMP  Testing/Procedures: NONE  Follow-Up: Your physician recommends that you schedule a follow-up appointment in: 1 Month   Any Other Special Instructions Will Be Listed Below (If Applicable).   If you need a refill on your cardiac medications before your next appointment, please call your pharmacy.

## 2016-04-13 NOTE — Progress Notes (Addendum)
Cardiology Office Note   Date:  04/13/2016   ID:  [name], DOB 03-27-1961, MRN [id number]  PCP:  [name], MD  Cardiologist:   [name], MD   Chief Complaint  Patient presents with  . Cardiomyopathy      History of Present Illness: [name] [name] is a 55 y.o. male who presents for follow-up of chronic systolic heart failure. He's had a dilated cardiomyopathy.  Cardiac catheterization in July of last year demonstrated 30% LAD stenosis but no other disease. He is status post ICD.  Since I last saw him he's had no new hospital or emergency room visits. He did see Dr. Tresa Endo for treatment of his sleep apnea.   He thinks his breathing is a little bit better since doing this. He has lost 13 pounds. He does still get short of breath with exertion. He's not describing any resting shortness of breath, PND or orthopnea. He's not having any palpitations, presyncope or syncope. At some point since I last saw him it seems like his Lasix has been increased as he is taking more than I thought.  Of note he did injure his leg moving something and has several stitches in his left anterior tibial area.   Past Medical History:  Diagnosis Date  . CAD (coronary artery disease)    Mild nonobstructive (Cath 09)  . Chronic systolic heart failure (HCC)    Nonischemic CM: echo 4/12 with EF 25-30%, grade 2 diast dysfxn, mild dilated aortic root 43 mm, trivial MR, mod LAE  . Dyspnea   . H/O ETOH abuse   . HTN (hypertension)   . Hypersomnia   . Obesity, morbid (HCC)   . OSA on CPAP   . Tobacco use disorder   . Ventricular tachycardia [facility])     Past Surgical History:  Procedure Laterality Date  . CARDIAC CATHETERIZATION N/A 04/08/2015   Procedure: Right/Left Heart Cath and Coronary Angiography;  Surgeon: Corky Crafts, MD;  Location: [facility] INVASIVE CV LAB;  Service: Cardiovascular;  Laterality: N/A;  . EP IMPLANTABLE DEVICE N/A 04/14/2015   Procedure: BiV ICD Insertion CRT-D;   Surgeon: [name], MD;  Location: [facility] INVASIVE CV LAB;  Service: Cardiovascular;  Laterality: N/A;  . HYDROCELE EXCISION / REPAIR    . TUMOR EXCISION Left   . VASECTOMY       Current Outpatient Prescriptions  Medication Sig Dispense Refill  . acetaminophen (TYLENOL) 325 MG tablet Take 2 tablets (650 mg total) by mouth every 4 (four) hours as needed for headache or mild pain.    Marland Kitchen amiodarone (PACERONE) 200 MG tablet TAKE ONE TABLET BY MOUTH ONCE DAILY 30 tablet 11  . atorvastatin (LIPITOR) 80 MG tablet TAKE ONE TABLET BY MOUTH ONCE DAILY AT 6 PM 30 tablet 6  . carvedilol (COREG) 12.5 MG tablet Take 1 tablet (12.5 mg total) by mouth 2 (two) times daily with a meal. 60 tablet 6  . furosemide (LASIX) 80 MG tablet Take by mouth. Pt take 80 mg in the morning and 40 mg in the evening    . glucosamine-chondroitin 500-400 MG tablet Take 2 tablets by mouth daily.    Marland Kitchen KLOR-CON M20 20 MEQ tablet TAKE TWO TABLETS BY MOUTH ONCE DAILY 60 tablet 6  . lisinopril (PRINIVIL,ZESTRIL) 2.5 MG tablet Take 1 tablet (2.5 mg total) by mouth 2 (two) times daily. 60 tablet 6  . pantoprazole (PROTONIX) 40 MG tablet TAKE ONE TABLET BY MOUTH ONCE DAILY AT  6  AM 30 tablet 6  . PARoxetine (PAXIL) 20 MG tablet Take 20 mg by mouth every morning.    Marland Kitchen spironolactone (ALDACTONE) 25 MG tablet Take 0.5 tablets (12.5 mg total) by mouth daily. 30 tablet 6  . warfarin (COUMADIN) 10 MG tablet Take 1 tablet (10 mg total) by mouth daily. 30 tablet 6  . warfarin (COUMADIN) 2.5 MG tablet TAKE ONE TABLET BY MOUTH EVERY OTHER DAY 30 tablet 1   No current facility-administered medications for this visit.     Allergies:   Review of patient's allergies indicates no known allergies.    ROS:  Please see the history of present illness.   Otherwise, review of systems are positive for none.   All other systems are reviewed and negative.    PHYSICAL EXAM: VS:  BP 124/64   Pulse 76   Ht 6\' 4"  (1.93 m)   Wt 300 lb (136.1 kg)   BMI  36.52 kg/m  , BMI Body mass index is 36.52 kg/m. GENERAL:  Well appearing HEENT:  Pupils equal round and reactive, fundi not visualized, oral mucosa unremarkable NECK:  No jugular venous distention, waveform within normal limits, carotid upstroke brisk and symmetric, no bruits, no thyromegaly LYMPHATICS:  No cervical, inguinal adenopathy LUNGS:  Clear to auscultation bilaterally BACK:  No CVA tenderness CHEST:  Unremarkable, well healed ICD pocket HEART:  PMI not displaced or sustained,S1 and S2 within normal limits, no S3, no S4, no clicks, no rubs, no murmurs ABD:  Flat, positive bowel sounds normal in frequency in pitch, no bruits, no rebound, no guarding, no midline pulsatile mass, no hepatomegaly, no splenomegaly EXT:  2 plus pulses throughout, no edema, no cyanosis no clubbing, wound on the right leg with stiches.     EKG:  EKG is ordered today. Sinus rhythm with ventricular pacing rate 76   Recent Labs: 04/15/2015: Hemoglobin 14.4; Platelets 174 06/11/2015: BUN 12; Creat 0.78; Potassium 4.2; Sodium 138 07/22/2015: ALT 25; TSH 1.332    Wt Readings from Last 3 Encounters:  04/13/16 300 lb (136.1 kg)  03/29/16 (!) 313 lb 9.6 oz (142.2 kg)  11/22/15 (!) 309 lb 6 oz (140.3 kg)    Lab Results  Component Value Date   TSH 1.332 07/22/2015   ALT 25 07/22/2015   AST 25 07/22/2015   ALKPHOS 82 07/22/2015   BILITOT 1.5 (H) 07/22/2015   PROT 6.2 07/22/2015   ALBUMIN 3.8 07/22/2015    Other studies Reviewed: Additional studies/ records that were reviewed today include: None Review of the above records demonstrates:     ASSESSMENT AND PLAN:  CM:  He seems to be euvolemic. However, he does still have some shortness of breath. I will check a BNP level which was elevated in the past. I will titrate his lisinopril to half milligrams twice daily.  TOBACCO:  He still smoking couple of cigarettes rarely needs to stop this completely. We have discussed this.  ETOH:   This may be the  etiology of his cardiomyopathy. He says he is still rarely drinking and he understands the need for complete abstinence.    ATRIAL FIB:  I will check a TSH and liver profile.  [name] has a CHA2DS2 - VASc score of  with 2 with a risk of stroke of 2.2%.  I'm going to check and make sure he is up-to-date with his INR follow-up.   SLEEP APNEA:  He has had follow up with Dr. Tresa Endo.     Current medicines  are reviewed at length with the patient today.  The patient does not have concerns regarding medicines.  The following changes have been made:  As above  Labs/ tests ordered today include:   Orders Placed This Encounter  Procedures  . B Nat Peptide  . COMPLETE METABOLIC PANEL WITH GFR  . TSH  . EKG 12-Lead     Disposition:   FU with me in one month.     Signed, [name], MD  04/13/2016 6:35 PM    [facility]

## 2016-04-17 ENCOUNTER — Other Ambulatory Visit: Payer: Self-pay | Admitting: Cardiology

## 2016-04-17 NOTE — Telephone Encounter (Signed)
REFILL 

## 2016-04-24 ENCOUNTER — Other Ambulatory Visit: Payer: Medicaid Other

## 2016-04-30 ENCOUNTER — Other Ambulatory Visit: Payer: Self-pay | Admitting: Cardiology

## 2016-05-02 NOTE — Telephone Encounter (Signed)
LMOM to schedule INR follow-up or determine if another office is managing warfarin as he is over due to followup and we have not checked an INR since last year.

## 2016-05-12 ENCOUNTER — Encounter: Payer: Self-pay | Admitting: Cardiology

## 2016-05-17 ENCOUNTER — Other Ambulatory Visit: Payer: Self-pay | Admitting: Cardiology

## 2016-05-24 NOTE — Progress Notes (Signed)
Cardiology Office Note   Date:  05/26/2016   ID:  [name] [name], DOB [birth year], MRN [id number]  PCP:  [name] Mask, MD  Cardiologist:   [name] Rotunda, MD   No chief complaint on file.     History of Present Illness: [name] [name] is a 55 y.o. male who presents for follow-up of chronic systolic heart failure. He's had a dilated cardiomyopathy.  Cardiac catheterization in July of last year demonstrated 30% LAD stenosis but no other disease. He is status post ICD.  At the last visit I increased his lisinopril.  He was to get a TSH, CMET and BNP.  However, it does not seem like this happened.  The patient says he doesn't know he missed a lab draw. I looked back and he's missed Coumadin appointments and he's not aware of this. He has not been compliant warfarin therapy unfortunately.  He says he's been feeling okay. He's not having any new shortness of breath, PND or orthopnea. He's not having any new palpitations, presyncope or syncope. He has no chest pressure, neck or arm discomfort.   Past Medical History:  Diagnosis Date  . CAD (coronary artery disease)    Mild nonobstructive (Cath 09)  . Chronic systolic heart failure (HCC)    Nonischemic CM: echo 4/12 with EF 25-30%, grade 2 diast dysfxn, mild dilated aortic root 43 mm, trivial MR, mod LAE  . Dyspnea   . H/O ETOH abuse   . HTN (hypertension)   . Hypersomnia   . Obesity, morbid (HCC)   . OSA on CPAP   . Tobacco use disorder   . Ventricular tachycardia [facility])     Past Surgical History:  Procedure Laterality Date  . CARDIAC CATHETERIZATION N/A 04/08/2015   Procedure: Right/Left Heart Cath and Coronary Angiography;  Surgeon: [name] Crafts, MD;  Location: [facility] INVASIVE CV LAB;  Service: Cardiovascular;  Laterality: N/A;  . EP IMPLANTABLE DEVICE N/A 04/14/2015   Procedure: BiV ICD Insertion CRT-D;  Surgeon: [name] Salvia, MD;  Location: [facility] INVASIVE CV LAB;  Service: Cardiovascular;  Laterality: N/A;  . HYDROCELE EXCISION  / REPAIR    . TUMOR EXCISION Left   . VASECTOMY       Current Outpatient Prescriptions  Medication Sig Dispense Refill  . acetaminophen (TYLENOL) 325 MG tablet Take 2 tablets (650 mg total) by mouth every 4 (four) hours as needed for headache or mild pain.    Marland Kitchen amiodarone (PACERONE) 200 MG tablet TAKE ONE TABLET BY MOUTH ONCE DAILY 30 tablet 11  . atorvastatin (LIPITOR) 80 MG tablet TAKE ONE TABLET BY MOUTH ONCE DAILY AT 6 PM 30 tablet 6  . carvedilol (COREG) 12.5 MG tablet TAKE ONE TABLET BY MOUTH TWICE DAILY WITH MEALS 60 tablet 6  . furosemide (LASIX) 80 MG tablet TAKE ONE TABLET BY MOUTH ONCE DAILY 30 tablet 2  . glucosamine-chondroitin 500-400 MG tablet Take 2 tablets by mouth daily.    Marland Kitchen KLOR-CON M20 20 MEQ tablet TAKE TWO TABLETS BY MOUTH ONCE DAILY 60 tablet 6  . lisinopril (PRINIVIL,ZESTRIL) 5 MG tablet Take 1 tablet (5 mg total) by mouth 2 (two) times daily. 60 tablet 11  . pantoprazole (PROTONIX) 40 MG tablet TAKE ONE TABLET BY MOUTH ONCE DAILY AT  6  AM 30 tablet 6  . PARoxetine (PAXIL) 20 MG tablet Take 20 mg by mouth every morning.    Marland Kitchen spironolactone (ALDACTONE) 25 MG tablet Take 0.5 tablets (12.5 mg total) by mouth daily. 30 tablet  6  . rivaroxaban (XARELTO) 20 MG TABS tablet Take 1 tablet (20 mg total) by mouth daily with supper. 30 tablet 6   No current facility-administered medications for this visit.     Allergies:   Review of patient's allergies indicates no known allergies.    ROS:  Please see the history of present illness.   Otherwise, review of systems are positive for none.   All other systems are reviewed and negative.    PHYSICAL EXAM: VS:  BP 114/76   Pulse 81   Ht 6\' 4"  (1.93 m)   Wt (!) 312 lb (141.5 kg)   SpO2 95%   BMI 37.98 kg/m  , BMI Body mass index is 37.98 kg/m. GENERAL:  Well appearing HEENT:  Pupils equal round and reactive, fundi not visualized, oral mucosa unremarkable NECK:  No jugular venous distention, waveform within normal  limits, carotid upstroke brisk and symmetric, no bruits, no thyromegaly LYMPHATICS:  No cervical, inguinal adenopathy LUNGS:  Clear to auscultation bilaterally BACK:  No CVA tenderness CHEST:  Unremarkable, well healed ICD pocket HEART:  PMI not displaced or sustained,S1 and S2 within normal limits, no S3, no S4, no clicks, no rubs, no murmurs ABD:  Flat, positive bowel sounds normal in frequency in pitch, no bruits, no rebound, no guarding, no midline pulsatile mass, no hepatomegaly, no splenomegaly EXT:  2 plus pulses throughout, no edema, no cyanosis no clubbing, wound on the right leg with stiches.     EKG:  EKG is not ordered today.   Recent Labs: 06/11/2015: BUN 12; Creat 0.78; Potassium 4.2; Sodium 138 07/22/2015: ALT 25; TSH 1.332    Wt Readings from Last 3 Encounters:  05/26/16 (!) 312 lb (141.5 kg)  04/13/16 300 lb (136.1 kg)  03/29/16 (!) 313 lb 9.6 oz (142.2 kg)    Lab Results  Component Value Date   TSH 1.332 07/22/2015   ALT 25 07/22/2015   AST 25 07/22/2015   ALKPHOS 82 07/22/2015   BILITOT 1.5 (H) 07/22/2015   PROT 6.2 07/22/2015   ALBUMIN 3.8 07/22/2015    Other studies Reviewed: Additional studies/ records that were reviewed today include: None Review of the above records demonstrates:      ASSESSMENT AND PLAN:  CM:  He seems to be euvolemic.  I will increase his lisinopril to 5 mg daily.  I am going to make sure he has the blood work that I wanted him to have previously which would include TSH and liver profile on amiodarone.  TOBACCO:  He still smoking couple of cigarettes rarely needs to stop this completely. We have discussed this.  ETOH:   This may be the etiology of his cardiomyopathy. He says he is still rarely drinking and he understands the need for complete abstinence.    ATRIAL FIB:  [name] has a CHA2DS2 - VASc score of  with 2 with a risk of stroke of 2.2%.  However, he's not been compliant with warfarin therapy. I'm going to switch  him to Xarelto.  We discussed the risks benefits of this and he did have a conference with our pharmacist..  SLEEP APNEA:  He has had follow up with Dr. Tresa EndoKelly.     Current medicines are reviewed at length with the patient today.  The patient does not have concerns regarding medicines.  The following changes have been made:  See above.   Labs/ tests ordered today include:   Orders Placed This Encounter  Procedures  . TSH  .  B Nat Peptide  . COMPLETE METABOLIC PANEL WITH GFR     Disposition:   FU with me or APP in 1 month.     Signed, [name] Rotunda, MD  05/26/2016 1:29 PM    [facility]

## 2016-05-26 ENCOUNTER — Ambulatory Visit: Payer: Self-pay | Admitting: Pharmacist Clinician (PhC)/ Clinical Pharmacy Specialist

## 2016-05-26 ENCOUNTER — Ambulatory Visit (INDEPENDENT_AMBULATORY_CARE_PROVIDER_SITE_OTHER): Payer: Medicaid Other | Admitting: Cardiology

## 2016-05-26 ENCOUNTER — Encounter: Payer: Self-pay | Admitting: Cardiology

## 2016-05-26 VITALS — BP 114/76 | HR 81 | Ht 76.0 in | Wt 312.0 lb

## 2016-05-26 DIAGNOSIS — I429 Cardiomyopathy, unspecified: Secondary | ICD-10-CM | POA: Diagnosis not present

## 2016-05-26 DIAGNOSIS — I4891 Unspecified atrial fibrillation: Secondary | ICD-10-CM

## 2016-05-26 DIAGNOSIS — I428 Other cardiomyopathies: Secondary | ICD-10-CM

## 2016-05-26 DIAGNOSIS — Z5181 Encounter for therapeutic drug level monitoring: Secondary | ICD-10-CM

## 2016-05-26 DIAGNOSIS — R0602 Shortness of breath: Secondary | ICD-10-CM

## 2016-05-26 DIAGNOSIS — R5383 Other fatigue: Secondary | ICD-10-CM

## 2016-05-26 DIAGNOSIS — Z79899 Other long term (current) drug therapy: Secondary | ICD-10-CM

## 2016-05-26 LAB — COMPLETE METABOLIC PANEL WITH GFR
ALT: 25 U/L (ref 9–46)
AST: 29 U/L (ref 10–35)
Albumin: 4.1 g/dL (ref 3.6–5.1)
Alkaline Phosphatase: 86 U/L (ref 40–115)
BILIRUBIN TOTAL: 1.1 mg/dL (ref 0.2–1.2)
BUN: 10 mg/dL (ref 7–25)
CO2: 29 mmol/L (ref 20–31)
Calcium: 9.1 mg/dL (ref 8.6–10.3)
Chloride: 101 mmol/L (ref 98–110)
Creat: 0.83 mg/dL (ref 0.70–1.33)
GFR, Est African American: >89 mL/min (ref >=60)
GLUCOSE: 114 mg/dL — AB (ref 65–99)
Potassium: 3.9 mmol/L (ref 3.5–5.3)
SODIUM: 139 mmol/L (ref 135–146)
TOTAL PROTEIN: 6.4 g/dL (ref 6.1–8.1)

## 2016-05-26 LAB — TSH: TSH: 0.72 mIU/L (ref 0.40–4.50)

## 2016-05-26 MED ORDER — LISINOPRIL 5 MG PO TABS: 5.0000 mg | ORAL_TABLET | Freq: Two times a day (BID) | ORAL | 11 refills | Status: DC

## 2016-05-26 MED ORDER — RIVAROXABAN 20 MG PO TABS: 20.0000 mg | ORAL_TABLET | Freq: Every day | ORAL | 6 refills | Status: DC

## 2016-05-26 NOTE — Patient Instructions (Addendum)
Medication Instructions:  INCREASE Lisinopril 5 mg twice a day and START Xarelto 20 mg daily  Labwork: TSH, CMP, BNP  Testing/Procedures: None Ordered  Follow-Up: Your physician recommends that you schedule a follow-up appointment in: 1 Month with APP   Any Other Special Instructions Will Be Listed Below (If Applicable).     If you need a refill on your cardiac medications before your next appointment, please call your pharmacy.

## 2016-05-27 LAB — BRAIN NATRIURETIC PEPTIDE: BRAIN NATRIURETIC PEPTIDE: 226.9 pg/mL — AB (ref <100)

## 2016-06-02 ENCOUNTER — Other Ambulatory Visit: Payer: Self-pay | Admitting: Cardiology

## 2016-06-12 ENCOUNTER — Telehealth: Payer: Self-pay | Admitting: Cardiology

## 2016-06-12 NOTE — Telephone Encounter (Signed)
New message ° °Patient calling the office for samples of medication: ° ° °1.  What medication and dosage are you requesting samples for? Xarelto 20mg  ° °2.  Are you currently out of this medication? yes ° ° ° °

## 2016-06-14 IMAGING — CR DG CHEST 1V PORT
2 series · 2 of 2 positions shown · non-contrast
Comparison: Chest radiograph performed 04/06/2015

CLINICAL DATA: Acute onset of dyspnea.  Initial encounter.

EXAM:
PORTABLE CHEST - 1 VIEW

[AP (1 of 2)]
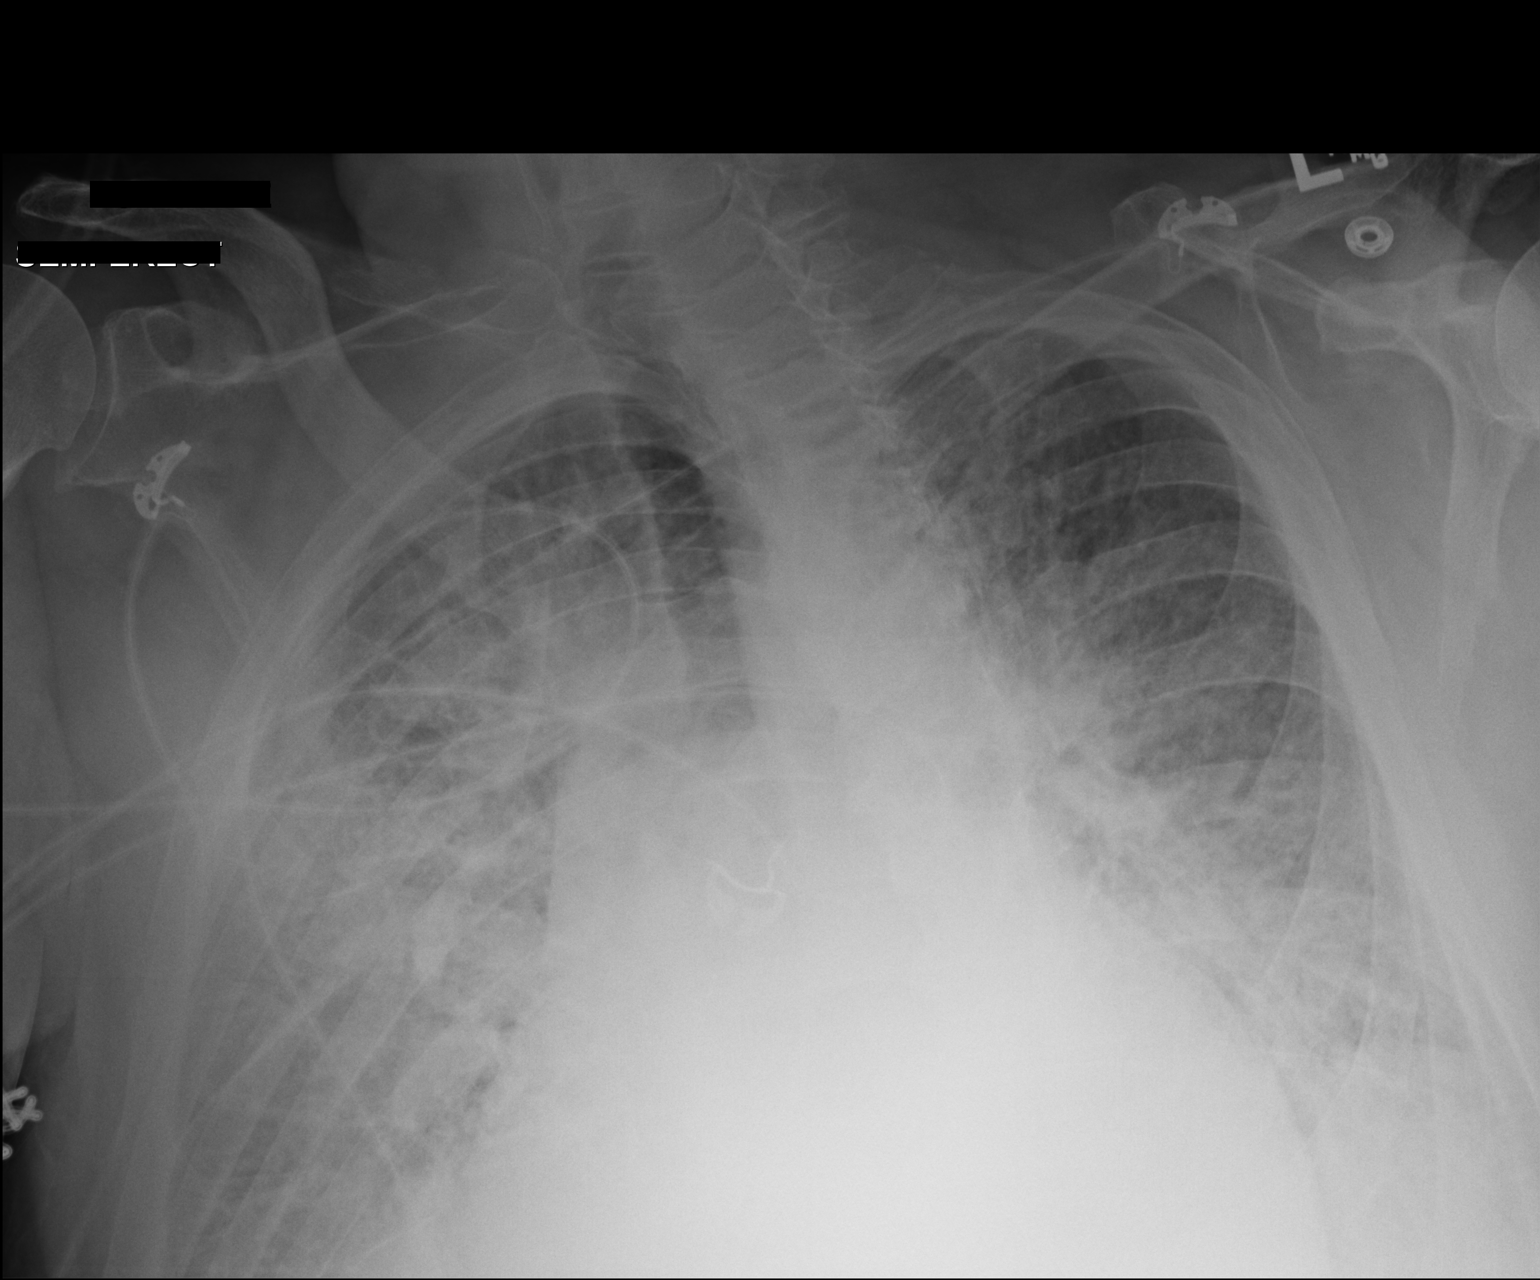

[AP (2 of 2)]
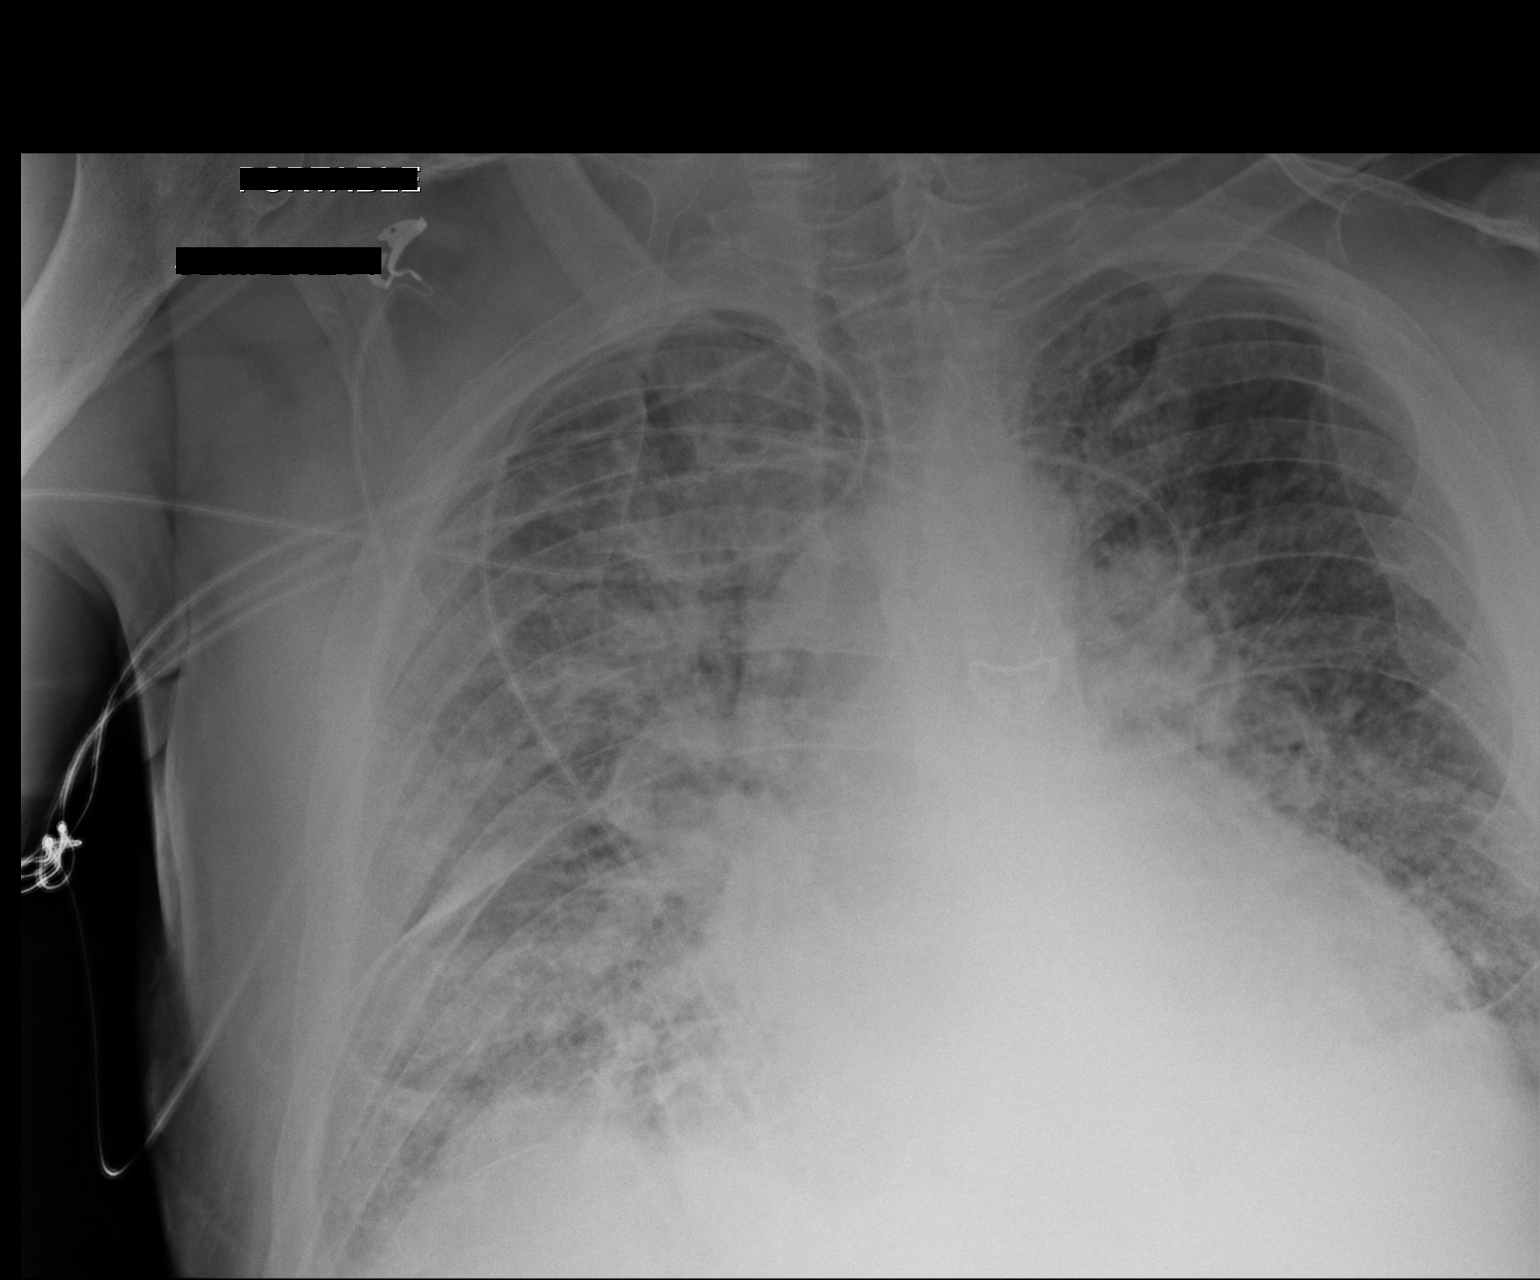

[2 of 2 positions shown; findings below may reference images not displayed]

FINDINGS: Diffuse bilateral airspace opacification is noted, with mild sparing
of the left lung apex, compatible with worsening pulmonary edema or
pneumonia. Small bilateral pleural effusions are suspected. No
pneumothorax is seen.

The cardiomediastinal silhouette is enlarged. No acute osseous
abnormalities are identified.
IMPRESSION: Worsening diffuse bilateral airspace opacification, reflecting
either worsening pulmonary edema or pneumonia. Suspect small
bilateral pleural effusions. Cardiomegaly noted.

## 2016-06-22 IMAGING — CR DG CHEST 2V
2 series · 2 of 2 positions shown · non-contrast
Comparison: 04/07/2015

CLINICAL DATA: Status post defibrillator placement

EXAM:
CHEST - 2 VIEW

[chest pa]
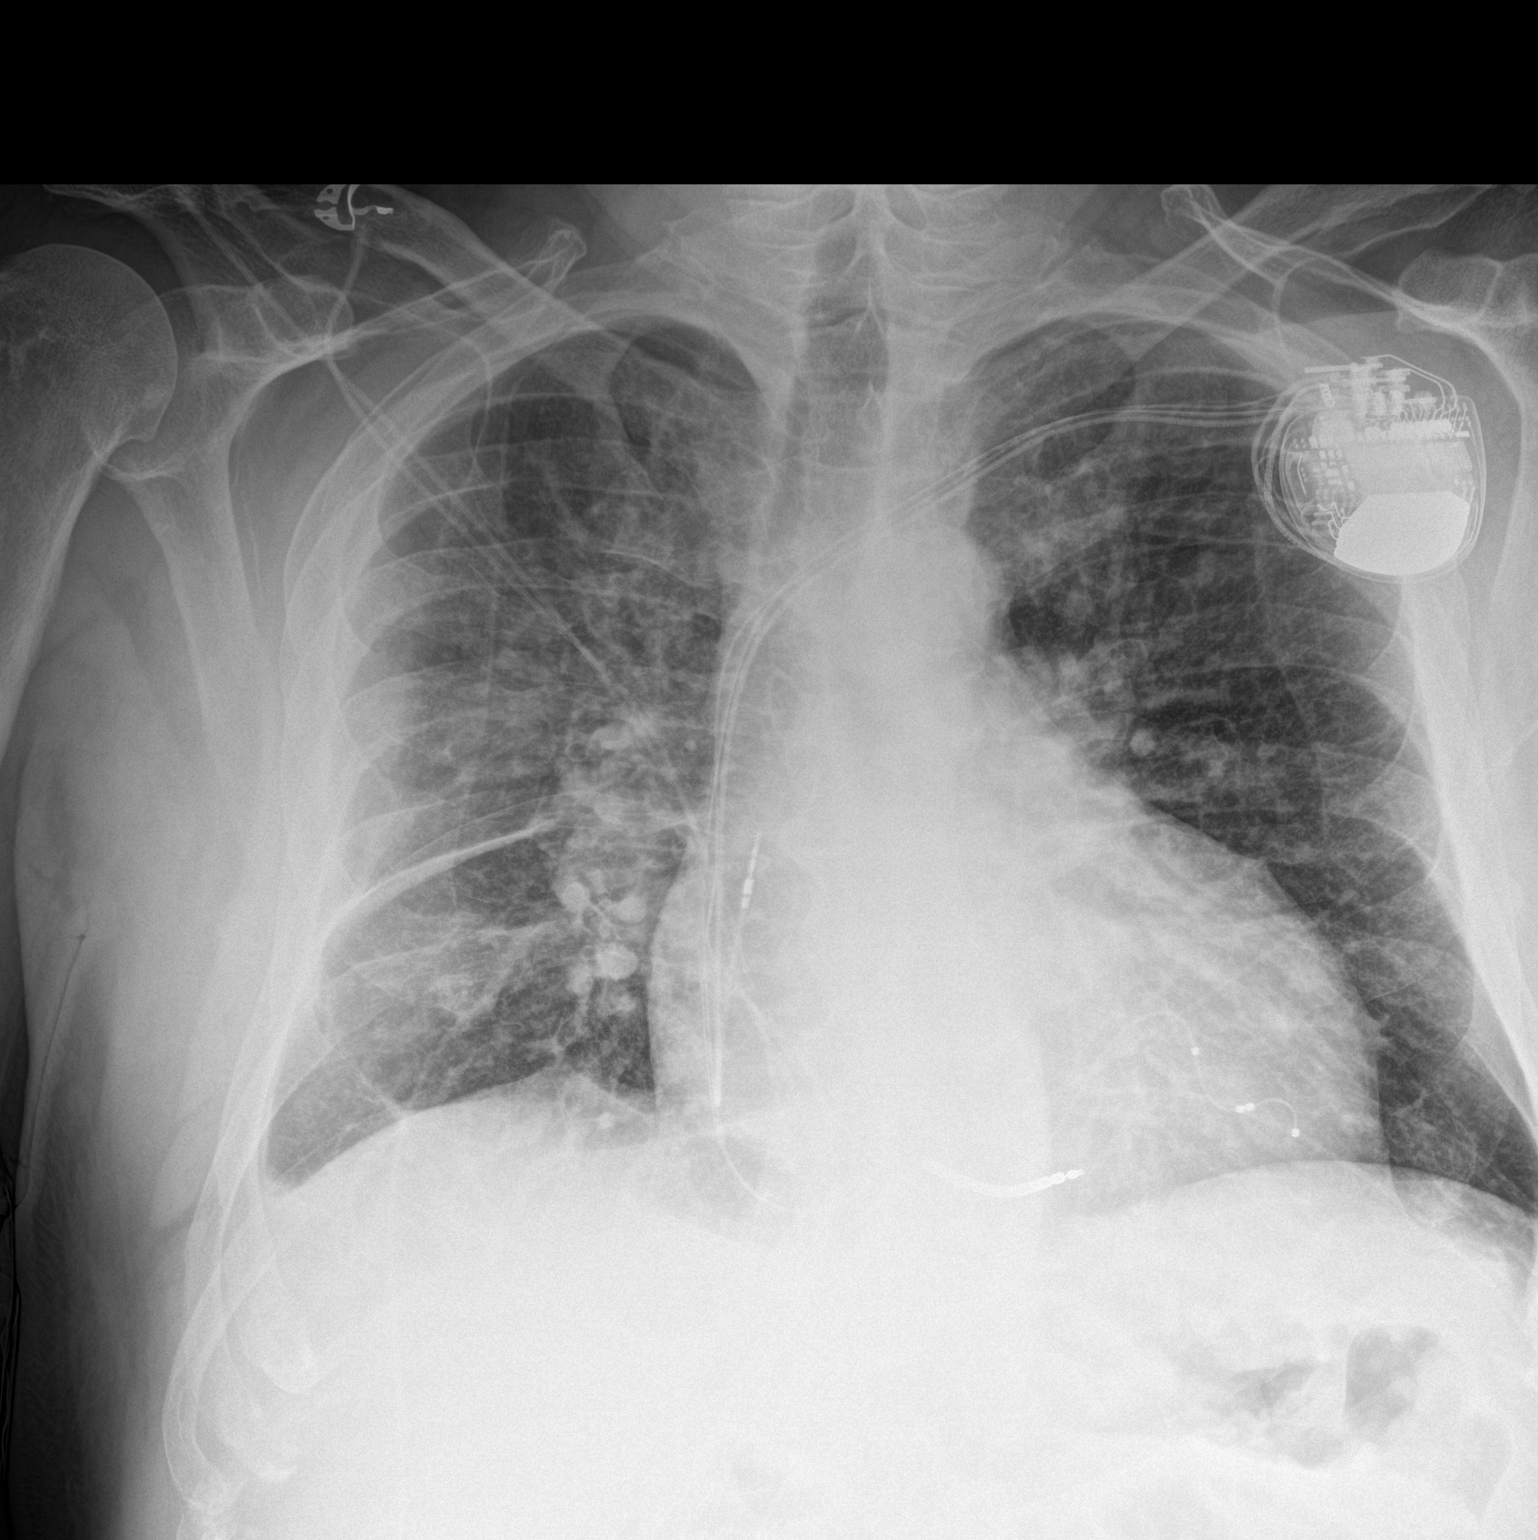

[chest lat]
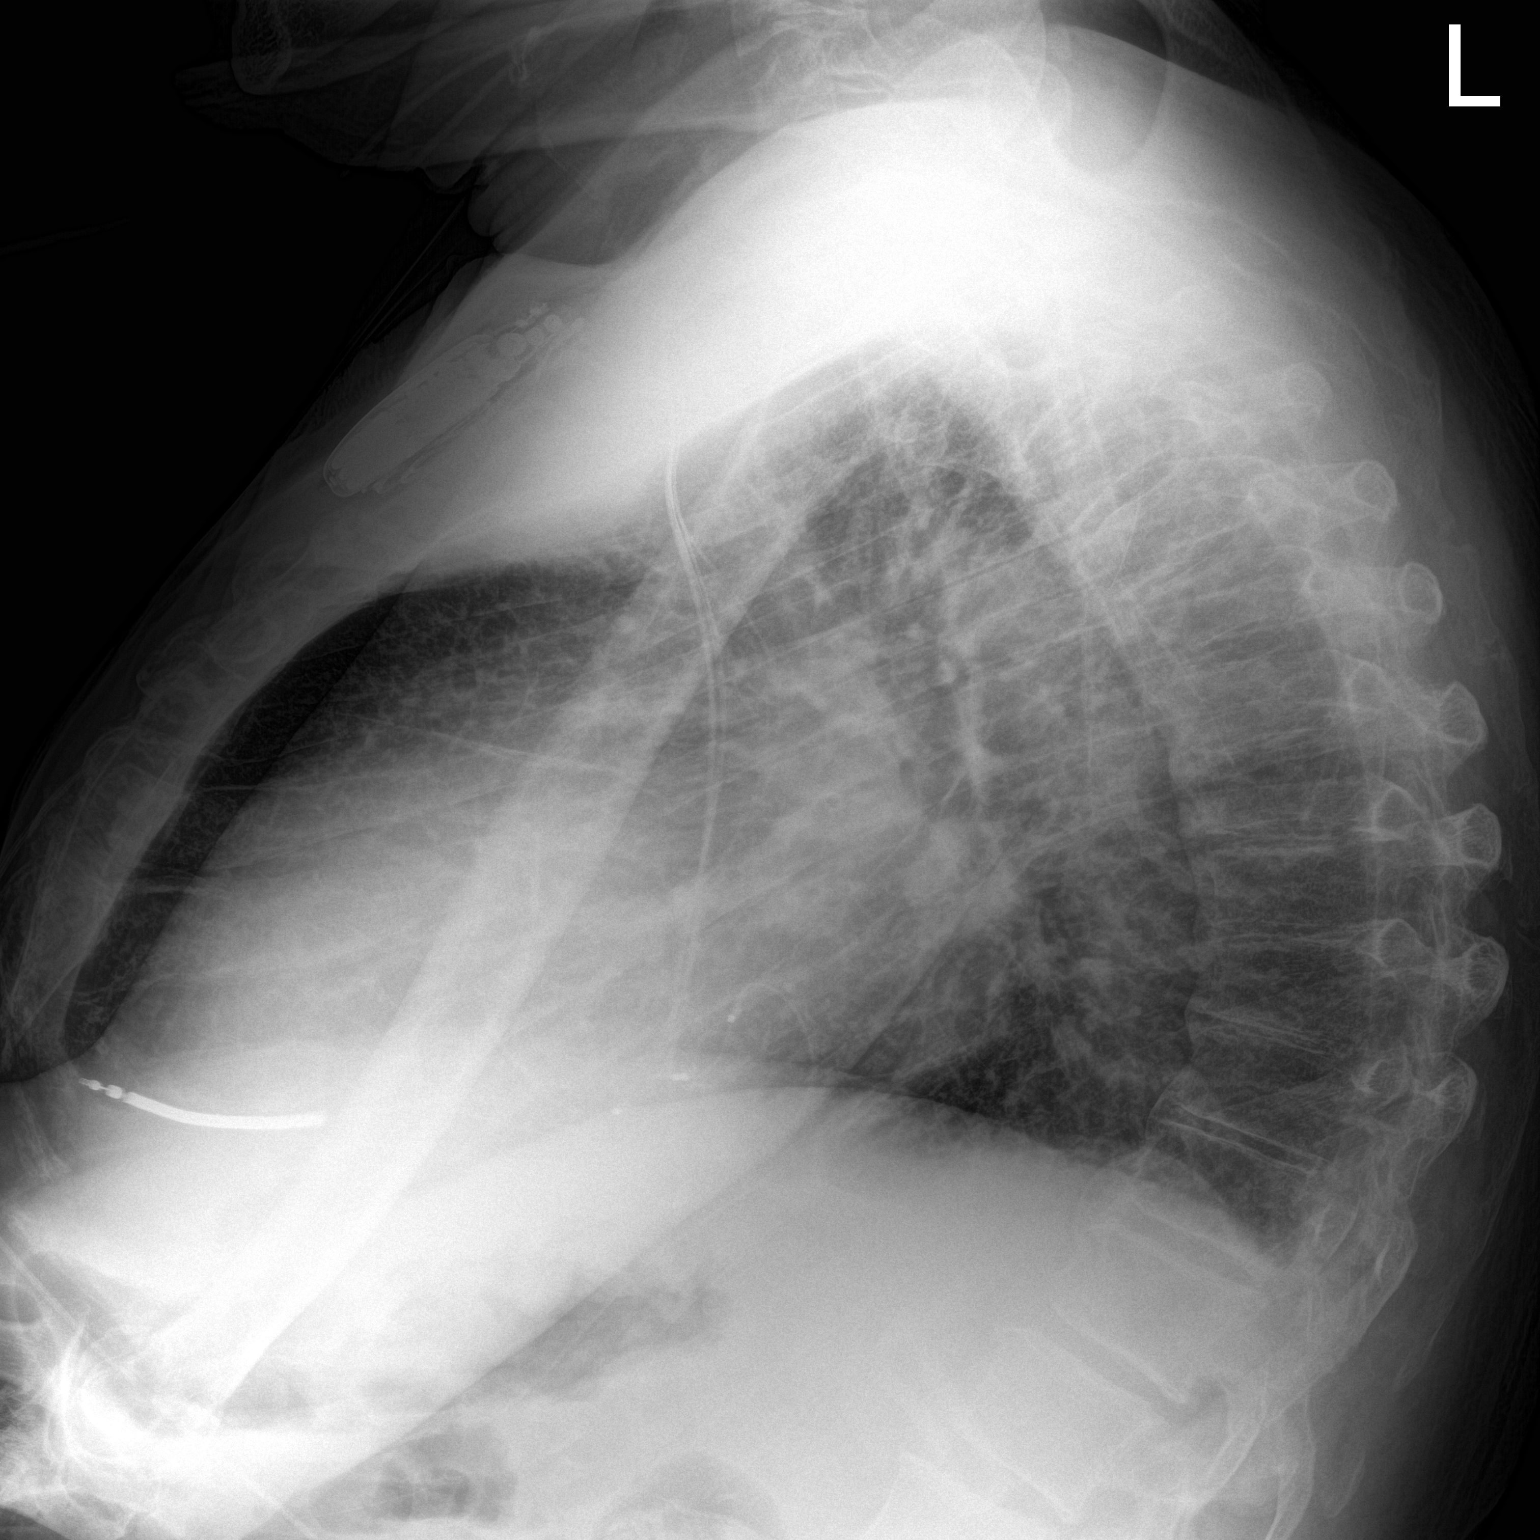

[2 of 2 positions shown; findings below may reference images not displayed]

FINDINGS: Cardiac shadow is stable but mildly enlarged. There is been
significant improvement in the degree of central vascular congestion
and CHF. Only mild central vascular prominence is noted. A new
defibrillator is seen in satisfactory position. No pneumothorax is
noted. Small right-sided pleural effusion is seen.
IMPRESSION: Persistent but significantly decreased vascular congestion. No
significant pulmonary edema is noted.

No pneumothorax is noted following defibrillator placement.

## 2016-06-26 ENCOUNTER — Other Ambulatory Visit: Payer: Self-pay | Admitting: Cardiology

## 2016-06-27 ENCOUNTER — Ambulatory Visit (INDEPENDENT_AMBULATORY_CARE_PROVIDER_SITE_OTHER): Payer: Medicaid Other | Admitting: Cardiology

## 2016-06-27 ENCOUNTER — Encounter: Payer: Self-pay | Admitting: Cardiology

## 2016-06-27 VITALS — BP 122/72 | HR 83 | Ht 76.0 in | Wt 304.4 lb

## 2016-06-27 DIAGNOSIS — Z95810 Presence of automatic (implantable) cardiac defibrillator: Secondary | ICD-10-CM | POA: Diagnosis not present

## 2016-06-27 DIAGNOSIS — Z7901 Long term (current) use of anticoagulants: Secondary | ICD-10-CM

## 2016-06-27 DIAGNOSIS — I5022 Chronic systolic (congestive) heart failure: Secondary | ICD-10-CM

## 2016-06-27 DIAGNOSIS — I2510 Atherosclerotic heart disease of native coronary artery without angina pectoris: Secondary | ICD-10-CM | POA: Diagnosis not present

## 2016-06-27 DIAGNOSIS — I4891 Unspecified atrial fibrillation: Secondary | ICD-10-CM

## 2016-06-27 DIAGNOSIS — I10 Essential (primary) hypertension: Secondary | ICD-10-CM

## 2016-06-27 DIAGNOSIS — F17200 Nicotine dependence, unspecified, uncomplicated: Secondary | ICD-10-CM

## 2016-06-27 NOTE — Assessment & Plan Note (Signed)
Xarelto

## 2016-06-27 NOTE — Assessment & Plan Note (Signed)
Inserted on 04/14/2015 for NICM, EF 15%, VTach

## 2016-06-27 NOTE — Assessment & Plan Note (Signed)
Controlled.  

## 2016-06-27 NOTE — Assessment & Plan Note (Signed)
No angina 

## 2016-06-27 NOTE — Assessment & Plan Note (Signed)
Still smokinmg a few cigarettes a day-"doing better"

## 2016-06-27 NOTE — Assessment & Plan Note (Signed)
EF 20-25% Nov 2016

## 2016-06-27 NOTE — Progress Notes (Signed)
06/27/2016 [name] [name]   July 20, 1961  [id number]  Primary Physician [name], MD Primary Cardiologist: Dr Antoine Poche  HPI:  55 y.o.obese Caucasian male followed by Dr Antoine Poche with a history of NICM. His last EF was 20-25% Nov 2016. He also has PAF, HTN, obesity, and is on chronic anticoagulation. He had problems with Coumadin and Dr Antoine Poche recently changed him to Xarelto. He presents for follow-up of chronic systolic heart failure. Recent labs including TSH, CMET and BNP were reviewed and were WNL. He says he has been doing well. He has lost a little wgt since his LOV. He is trying to eat better but admits he slipped this weekend "had some country ham".    Current Outpatient Prescriptions  Medication Sig Dispense Refill  . acetaminophen (TYLENOL) 325 MG tablet Take 2 tablets (650 mg total) by mouth every 4 (four) hours as needed for headache or mild pain.    Marland Kitchen amiodarone (PACERONE) 200 MG tablet TAKE ONE TABLET BY MOUTH ONCE DAILY 30 tablet 11  . atorvastatin (LIPITOR) 80 MG tablet TAKE ONE TABLET BY MOUTH ONCE DAILY AT 6 PM 30 tablet 6  . carvedilol (COREG) 12.5 MG tablet TAKE ONE TABLET BY MOUTH TWICE DAILY WITH MEALS 60 tablet 6  . furosemide (LASIX) 80 MG tablet TAKE ONE TABLET BY MOUTH ONCE DAILY 30 tablet 2  . glucosamine-chondroitin 500-400 MG tablet Take 2 tablets by mouth daily.    Marland Kitchen lisinopril (PRINIVIL,ZESTRIL) 5 MG tablet Take 1 tablet (5 mg total) by mouth 2 (two) times daily. 60 tablet 11  . pantoprazole (PROTONIX) 40 MG tablet TAKE ONE TABLET BY MOUTH ONCE DAILY AT  6  AM 30 tablet 6  . PARoxetine (PAXIL) 20 MG tablet Take 20 mg by mouth every morning.    . potassium chloride SA (KLOR-CON M20) 20 MEQ tablet Take 2 tablets (40 mEq total) by mouth daily. 60 tablet 11  . rivaroxaban (XARELTO) 20 MG TABS tablet Take 1 tablet (20 mg total) by mouth daily with supper. 30 tablet 6  . spironolactone (ALDACTONE) 25 MG tablet Take 0.5 tablets (12.5 mg total) by mouth  daily. 30 tablet 6   No current facility-administered medications for this visit.     No Known Allergies  Social History   Social History  . Marital status: Single    Spouse name: N/A  . Number of children: 2  . Years of education: N/A   Occupational History  . scrap metal    Social History Main Topics  . Smoking status: Current Some Day Smoker    Packs/day: 0.50    Years: 4.00    Types: Cigarettes  . Smokeless tobacco: Never Used  . Alcohol use 3.6 oz/week    6 Cans of beer per week  . Drug use:     Types: Codeine, Marijuana     Comment: has discontinued  . Sexual activity: Not on file   Other Topics Concern  . Not on file   Social History Narrative  . No narrative on file     Review of Systems: General: negative for chills, fever, night sweats or weight changes.  Cardiovascular: negative for chest pain, dyspnea on exertion, edema, orthopnea, palpitations, paroxysmal nocturnal dyspnea or shortness of breath Dermatological: negative for rash Respiratory: negative for cough or wheezing Urologic: negative for hematuria Abdominal: negative for nausea, vomiting, diarrhea, bright red blood per rectum, melena, or hematemesis Neurologic: negative for visual changes, syncope, or dizziness All other systems reviewed and are otherwise  negative except as noted above.    Blood pressure 122/72, pulse 83, height 6\' 4"  (1.93 m), weight (!) 304 lb 6.4 oz (138.1 kg).  General appearance: alert, cooperative, no distress and mildly obese Neck: no JVD Lungs: clear to auscultation bilaterally Heart: regular rate and rhythm Pulses: 2+ and symmetric Skin: Skin color, texture, turgor normal. No rashes or lesions Neurologic: Grossly normal    ASSESSMENT AND PLAN:   Chronic systolic heart failure (HCC) EF 20-25% Nov 2016  S/P ICD (internal cardiac defibrillator) procedure Medtronic CRT ICD Inserted on 04/14/2015 for NICM, EF 15%, VTach  Atrial fibrillation with RVR -  Paroxysmal NSR on Amiodarone  CAD- minor CAD 2009 and 2016 No angina  Tobacco use disorder- 1/2 pk a day Still smokinmg a few cigarettes a day-"doing better"  Essential hypertension Controlled  Chronic anticoagulation Xarelto   PLAN  Will discuss with Dr Antoine PocheHochrein- ? Entresto.   The pt seems to better with more frequent OV, will arrange for 3 month F/U with Dr Antoine PocheHochrein.  Corine ShelterLuke Tabria Steines PA-C 06/27/2016 10:25 AM

## 2016-06-27 NOTE — Assessment & Plan Note (Signed)
NSR on Amiodarone- 

## 2016-06-27 NOTE — Patient Instructions (Signed)
Medication Instructions:  Your physician recommends that you continue on your current medications as directed. Please refer to the Current Medication list given to you today.   Labwork: none  Testing/Procedures: none  Follow-Up: Your physician recommends that you schedule a follow-up appointment in: 3 months with Dr. Antoine Poche.    Any Other Special Instructions Will Be Listed Below (If Applicable).     If you need a refill on your cardiac medications before your next appointment, please call your pharmacy.

## 2016-08-22 ENCOUNTER — Other Ambulatory Visit: Payer: Self-pay | Admitting: Cardiovascular Disease

## 2016-08-22 NOTE — Telephone Encounter (Signed)
Dr. Hochrein pt. °

## 2016-08-22 NOTE — Telephone Encounter (Signed)
Rx has been sent to the pharmacy electronically. ° °

## 2016-09-13 ENCOUNTER — Other Ambulatory Visit: Payer: Self-pay | Admitting: Cardiology

## 2016-09-24 ENCOUNTER — Other Ambulatory Visit: Payer: Self-pay | Admitting: Cardiology

## 2016-09-25 NOTE — Telephone Encounter (Signed)
Rx(s) sent to pharmacy electronically.  

## 2016-10-06 ENCOUNTER — Ambulatory Visit: Payer: Medicaid Other | Admitting: Cardiology

## 2016-10-06 NOTE — Progress Notes (Deleted)
Cardiology Office Note   Date:  10/06/2016   ID:  [name] [name], DOB 10/25/1960, MRN [id number]  PCP:  [name] Mask, MD  Cardiologist:   [name] Rotunda, MD   No chief complaint on file.     History of Present Illness: [name] [name] is a 56 y.o. male who presents for follow-up of chronic systolic heart failure. He's had a dilated cardiomyopathy.  Cardiac catheterization in July of last year demonstrated 30% LAD stenosis but no other disease. He is status post ICD.  At the last visit with me I increased his lisinopril and changed him to Xarelto as he was not adherent to warfarin.  ***    H  He was to get a TSH, CMET and BNP.  However, it does not seem like this happened.  The patient says he doesn't know he missed a lab draw. I looked back and he's missed Coumadin appointments and he's not aware of this. He has not been compliant warfarin therapy unfortunately.  He says he's been feeling okay. He's not having any new shortness of breath, PND or orthopnea. He's not having any new palpitations, presyncope or syncope. He has no chest pressure, neck or arm discomfort.   Past Medical History:  Diagnosis Date  . CAD (coronary artery disease)    Mild nonobstructive (Cath 09)  . Chronic systolic heart failure (HCC)    Nonischemic CM: echo 4/12 with EF 25-30%, grade 2 diast dysfxn, mild dilated aortic root 43 mm, trivial MR, mod LAE  . Dyspnea   . H/O ETOH abuse   . HTN (hypertension)   . Hypersomnia   . Obesity, morbid (HCC)   . OSA on CPAP   . Tobacco use disorder   . Ventricular tachycardia [facility])     Past Surgical History:  Procedure Laterality Date  . CARDIAC CATHETERIZATION N/A 04/08/2015   Procedure: Right/Left Heart Cath and Coronary Angiography;  Surgeon: Corky Crafts, MD;  Location: [facility] INVASIVE CV LAB;  Service: Cardiovascular;  Laterality: N/A;  . EP IMPLANTABLE DEVICE N/A 04/14/2015   Procedure: BiV ICD Insertion CRT-D;  Surgeon: [name], MD;  Location: [facility]  INVASIVE CV LAB;  Service: Cardiovascular;  Laterality: N/A;  . HYDROCELE EXCISION / REPAIR    . TUMOR EXCISION Left   . VASECTOMY       Current Outpatient Prescriptions  Medication Sig Dispense Refill  . acetaminophen (TYLENOL) 325 MG tablet Take 2 tablets (650 mg total) by mouth every 4 (four) hours as needed for headache or mild pain.    Marland Kitchen amiodarone (PACERONE) 200 MG tablet TAKE ONE TABLET BY MOUTH ONCE DAILY 30 tablet 11  . atorvastatin (LIPITOR) 80 MG tablet TAKE ONE TABLET BY MOUTH ONCE DAILY AT 6 PM 30 tablet 6  . carvedilol (COREG) 12.5 MG tablet TAKE ONE TABLET BY MOUTH TWICE DAILY WITH MEALS 60 tablet 6  . furosemide (LASIX) 80 MG tablet TAKE ONE TABLET BY MOUTH ONCE DAILY 30 tablet 9  . glucosamine-chondroitin 500-400 MG tablet Take 2 tablets by mouth daily.    Marland Kitchen lisinopril (PRINIVIL,ZESTRIL) 5 MG tablet Take 1 tablet (5 mg total) by mouth 2 (two) times daily. 60 tablet 11  . pantoprazole (PROTONIX) 40 MG tablet TAKE ONE TABLET BY MOUTH ONCE DAILY AT 6 AM 30 tablet 3  . PARoxetine (PAXIL) 20 MG tablet Take 20 mg by mouth every morning.    . potassium chloride SA (KLOR-CON M20) 20 MEQ tablet Take 2 tablets (40 mEq total) by  mouth daily. 60 tablet 11  . rivaroxaban (XARELTO) 20 MG TABS tablet Take 1 tablet (20 mg total) by mouth daily with supper. 30 tablet 6  . spironolactone (ALDACTONE) 25 MG tablet Take 0.5 tablets (12.5 mg total) by mouth daily. 30 tablet 6   No current facility-administered medications for this visit.     Allergies:   Patient has no known allergies.    ROS:  Please see the history of present illness.   Otherwise, review of systems are positive for ***.   All other systems are reviewed and negative.    PHYSICAL EXAM: VS:  There were no vitals taken for this visit. , BMI There is no height or weight on file to calculate BMI. GENERAL:  Well appearing HEENT:  Pupils equal round and reactive, fundi not visualized, oral mucosa unremarkable NECK:  No  jugular venous distention, waveform within normal limits, carotid upstroke brisk and symmetric, no bruits, no thyromegaly LYMPHATICS:  No cervical, inguinal adenopathy LUNGS:  Clear to auscultation bilaterally BACK:  No CVA tenderness CHEST:  Unremarkable, well healed ICD pocket HEART:  PMI not displaced or sustained,S1 and S2 within normal limits, no S3, no S4, no clicks, no rubs, no murmurs ABD:  Flat, positive bowel sounds normal in frequency in pitch, no bruits, no rebound, no guarding, no midline pulsatile mass, no hepatomegaly, no splenomegaly EXT:  2 plus pulses throughout, no edema, no cyanosis no clubbing, wound on the right leg with stiches.     EKG:  EKG is not *** ordered today.   Recent Labs: 05/26/2016: ALT 25; Brain Natriuretic Peptide 226.9; BUN 10; Creat 0.83; Potassium 3.9; Sodium 139; TSH 0.72    Wt Readings from Last 3 Encounters:  06/27/16 (!) 304 lb 6.4 oz (138.1 kg)  05/26/16 (!) 312 lb (141.5 kg)  04/13/16 300 lb (136.1 kg)    Lab Results  Component Value Date   TSH 0.72 05/26/2016   ALT 25 05/26/2016   AST 29 05/26/2016   ALKPHOS 86 05/26/2016   BILITOT 1.1 05/26/2016   PROT 6.4 05/26/2016   ALBUMIN 4.1 05/26/2016    Other studies Reviewed: Additional studies/ records that were reviewed today include: *** Review of the above records demonstrates:   ***   ASSESSMENT AND PLAN:  CM:  He seems to be euvolemic.  ***  TSH and liver profile on amiodarone.  TOBACCO:  He still smoking couple of cigarettes rarely needs to stop this completely. We have discussed this.  ETOH:   This may be the etiology of his cardiomyopathy. He says he is still rarely drinking and he understands the need for complete abstinence.    ATRIAL FIB:  [name] has a CHA2DS2 - VASc score of  with 2 with a risk of stroke of 2.2%.  ***  SLEEP APNEA:  He has had follow up with Dr. Tresa Endo.  ***    Current medicines are reviewed at length with the patient today.  The patient  does not have concerns regarding medicines.  The following changes have been made:  ***   Labs/ tests ordered today include:  *** No orders of the defined types were placed in this encounter.    Disposition:   FU with ***   Signed, [name] Rotunda, MD  10/06/2016 7:12 AM    [facility]

## 2016-10-09 ENCOUNTER — Encounter: Payer: Self-pay | Admitting: *Deleted

## 2016-10-27 ENCOUNTER — Other Ambulatory Visit: Payer: Self-pay | Admitting: Family Medicine

## 2016-10-27 ENCOUNTER — Ambulatory Visit
Admission: RE | Admit: 2016-10-27 | Discharge: 2016-10-27 | Disposition: A | Discharge status: Home / Self Care | Payer: Medicaid Other | Source: Ambulatory Visit | Attending: Family Medicine | Admitting: Family Medicine

## 2016-10-27 DIAGNOSIS — R059 Cough, unspecified: Secondary | ICD-10-CM

## 2016-10-27 DIAGNOSIS — R05 Cough: Secondary | ICD-10-CM

## 2016-11-01 NOTE — Progress Notes (Signed)
Cardiology Office Note   Date:  11/03/2016   ID:  [name] [name], DOB Feb 06, 1961, MRN [id number]  PCP:  [name], MD  Cardiologist:   [name], MD   Chief Complaint  Patient presents with  . Cardiomyopathy      History of Present Illness: [name] [name] is a 56 y.o. male who presents for follow-up of chronic systolic heart failure. He's had a dilated cardiomyopathy.  Cardiac catheterization in July of last year demonstrated 30% LAD stenosis but no other disease. He is status post ICD.  At a previous visit I stopped warfarin as he was not compliant.  He was started on Xarelto.  Most recently he was seen with some acute on chronic HF symptoms.  He's been taking an extra half of Lasix in the afternoon he says his breathing better. Turns out he is out of his carvedilol has been for weeks. He does not weigh himself daily.  He still smokes some cigarettes and drinks some alcohol.  He says that he does watch his salt.  The patient denies any new symptoms such as chest discomfort, neck or arm discomfort. There has been no new shortness of breath, PND or orthopnea. There have been no reported palpitations, presyncope or syncope.  He is about to lose his Medicaid because he is told he makes too much money with SSI.   Past Medical History:  Diagnosis Date  . CAD (coronary artery disease)    Mild nonobstructive (Cath 09)  . Chronic systolic heart failure (HCC)    Nonischemic CM: echo 4/12 with EF 25-30%, grade 2 diast dysfxn, mild dilated aortic root 43 mm, trivial MR, mod LAE  . Dyspnea   . H/O ETOH abuse   . HTN (hypertension)   . Hypersomnia   . Obesity, morbid (HCC)   . OSA on CPAP   . Tobacco use disorder   . Ventricular tachycardia [facility])     Past Surgical History:  Procedure Laterality Date  . CARDIAC CATHETERIZATION N/A 04/08/2015   Procedure: Right/Left Heart Cath and Coronary Angiography;  Surgeon: Corky Crafts, MD;  Location: [facility] INVASIVE CV LAB;  Service:  Cardiovascular;  Laterality: N/A;  . EP IMPLANTABLE DEVICE N/A 04/14/2015   Procedure: BiV ICD Insertion CRT-D;  Surgeon: [name], MD;  Location: [facility] INVASIVE CV LAB;  Service: Cardiovascular;  Laterality: N/A;  . HYDROCELE EXCISION / REPAIR    . TUMOR EXCISION Left   . VASECTOMY       Current Outpatient Prescriptions  Medication Sig Dispense Refill  . acetaminophen (TYLENOL) 325 MG tablet Take 2 tablets (650 mg total) by mouth every 4 (four) hours as needed for headache or mild pain.    Marland Kitchen amiodarone (PACERONE) 200 MG tablet TAKE ONE TABLET BY MOUTH ONCE DAILY 30 tablet 11  . atorvastatin (LIPITOR) 80 MG tablet TAKE ONE TABLET BY MOUTH ONCE DAILY AT 6 PM 30 tablet 6  . carvedilol (COREG) 12.5 MG tablet Take 1 tablet (12.5 mg total) by mouth 2 (two) times daily with a meal. 180 tablet 3  . furosemide (LASIX) 80 MG tablet Take by mouth. Take 80 mg in the morning and 40 mg in the evening    . glucosamine-chondroitin 500-400 MG tablet Take 2 tablets by mouth daily.    Marland Kitchen lisinopril (PRINIVIL,ZESTRIL) 5 MG tablet Take 1 tablet (5 mg total) by mouth 2 (two) times daily. 60 tablet 11  . pantoprazole (PROTONIX) 40 MG tablet TAKE ONE TABLET BY MOUTH ONCE  DAILY AT 6 AM 30 tablet 3  . PARoxetine (PAXIL) 20 MG tablet Take 20 mg by mouth every morning.    . potassium chloride SA (KLOR-CON M20) 20 MEQ tablet Take 2 tablets (40 mEq total) by mouth daily. 60 tablet 11  . rivaroxaban (XARELTO) 20 MG TABS tablet Take 1 tablet (20 mg total) by mouth daily with supper. 30 tablet 6  . spironolactone (ALDACTONE) 25 MG tablet Take 0.5 tablets (12.5 mg total) by mouth daily. 30 tablet 6   No current facility-administered medications for this visit.     Allergies:   Patient has no known allergies.    ROS:  Please see the history of present illness.   Otherwise, review of systems are positive for none.   All other systems are reviewed and negative.    PHYSICAL EXAM: VS:  BP 116/74   Pulse 74   Ht 6\' 4"   (1.93 m)   Wt (!) 304 lb 9.6 oz (138.2 kg)   SpO2 99%   BMI 37.08 kg/m  , BMI Body mass index is 37.08 kg/m. GENERAL:  Well appearing HEENT:  Pupils equal round and reactive, fundi not visualized, oral mucosa unremarkable NECK:  No jugular venous distention, waveform within normal limits, carotid upstroke brisk and symmetric, no bruits, no thyromegaly LYMPHATICS:  No cervical, inguinal adenopathy LUNGS:  Clear to auscultation bilaterally BACK:  No CVA tenderness CHEST:  Unremarkable, well healed ICD pocket HEART:  PMI not displaced or sustained,S1 and S2 within normal limits, no S3, no S4, no clicks, no rubs, no murmurs ABD:  Flat, positive bowel sounds normal in frequency in pitch, no bruits, no rebound, no guarding, no midline pulsatile mass, no hepatomegaly, no splenomegaly EXT:  2 plus pulses throughout, no edema, no cyanosis no clubbing, wound on the right leg with stiches.     EKG:  EKG is not ordered today.    Recent Labs: 05/26/2016: ALT 25; Brain Natriuretic Peptide 226.9; BUN 10; Creat 0.83; Potassium 3.9; Sodium 139; TSH 0.72    Wt Readings from Last 3 Encounters:  11/02/16 (!) 304 lb 9.6 oz (138.2 kg)  06/27/16 (!) 304 lb 6.4 oz (138.1 kg)  05/26/16 (!) 312 lb (141.5 kg)    Lab Results  Component Value Date   TSH 0.72 05/26/2016   ALT 25 05/26/2016   AST 29 05/26/2016   ALKPHOS 86 05/26/2016   BILITOT 1.1 05/26/2016   PROT 6.4 05/26/2016   ALBUMIN 4.1 05/26/2016   Lab Results  Component Value Date   TSH 0.72 05/26/2016    Other studies Reviewed: Additional studies/ records that were reviewed today include: None Review of the above records demonstrates:      ASSESSMENT AND PLAN:  CM:  He needs to be restarted on his carvedilol. We talked about daily weights.  He is not participating his his health maintenance lifestyle changes unfortunately.    TOBACCO:   He still smoking couple of cigarettes rarely needs to stop this completely. We have discussed  this.  ETOH:   This may be the etiology of his cardiomyopathy. He says he is still rarely drinking and he understands the need for complete abstinence.    ATRIAL FIB:  [name] has a CHA2DS2 - VASc score of  with 2 with a risk of stroke of 2.2%.  However, he's not been compliant with warfarin therapy. He was switched to Xarelto.   I will check TSH CMET and CBC.   SLEEP APNEA:  He has  had follow up with Dr. Tresa Endo.   ICD:  He is up to date with follow up.      Current medicines are reviewed at length with the patient today.  The patient does not have concerns regarding medicines.  The following changes have been made:  None  Labs/ tests ordered today include:    Orders Placed This Encounter  Procedures  . TSH  . Comprehensive Metabolic Panel (CMET)  . CBC     Disposition:   FU with APP in six months.    Signed, [name], MD  11/03/2016 7:39 AM    [facility]

## 2016-11-02 ENCOUNTER — Ambulatory Visit (INDEPENDENT_AMBULATORY_CARE_PROVIDER_SITE_OTHER): Payer: Medicaid Other | Admitting: Cardiology

## 2016-11-02 ENCOUNTER — Encounter: Payer: Self-pay | Admitting: Cardiology

## 2016-11-02 VITALS — BP 116/74 | HR 74 | Ht 76.0 in | Wt 304.6 lb

## 2016-11-02 DIAGNOSIS — Z79899 Other long term (current) drug therapy: Secondary | ICD-10-CM | POA: Diagnosis not present

## 2016-11-02 DIAGNOSIS — R5383 Other fatigue: Secondary | ICD-10-CM

## 2016-11-02 DIAGNOSIS — I5022 Chronic systolic (congestive) heart failure: Secondary | ICD-10-CM

## 2016-11-02 DIAGNOSIS — I4891 Unspecified atrial fibrillation: Secondary | ICD-10-CM

## 2016-11-02 MED ORDER — CARVEDILOL 12.5 MG PO TABS: 12.5000 mg | ORAL_TABLET | Freq: Two times a day (BID) | ORAL | 3 refills | Status: DC

## 2016-11-02 NOTE — Patient Instructions (Signed)
Medication Instructions:  Continue current medications  Labwork: CBC, CMP, TSH  Testing/Procedures: None Ordered  Follow-Up: Your physician wants you to follow-up in: 6 Months with APP. You will receive a reminder letter in the mail two months in advance. If you don't receive a letter, please call our office to schedule the follow-up appointment.   Any Other Special Instructions Will Be Listed Below (If Applicable).   If you need a refill on your cardiac medications before your next appointment, please call your pharmacy.

## 2016-11-03 ENCOUNTER — Encounter: Payer: Self-pay | Admitting: Cardiology

## 2016-11-10 ENCOUNTER — Encounter: Payer: Self-pay | Admitting: Cardiology

## 2016-11-16 DIAGNOSIS — I5043 Acute on chronic combined systolic (congestive) and diastolic (congestive) heart failure: Secondary | ICD-10-CM

## 2016-11-16 HISTORY — DX: Acute on chronic combined systolic (congestive) and diastolic (congestive) heart failure: I50.43

## 2016-12-25 ENCOUNTER — Inpatient Hospital Stay (HOSPITAL_COMMUNITY)
Admission: EM | Admit: 2016-12-25 | Discharge: 2016-12-27 | DRG: 291 | Disposition: A | Discharge status: Home / Self Care | Payer: Medicaid Other | Attending: Oncology | Admitting: Oncology

## 2016-12-25 ENCOUNTER — Emergency Department (HOSPITAL_COMMUNITY): Payer: Medicaid Other

## 2016-12-25 ENCOUNTER — Encounter (HOSPITAL_COMMUNITY): Payer: Self-pay | Admitting: Emergency Medicine

## 2016-12-25 DIAGNOSIS — I161 Hypertensive emergency: Secondary | ICD-10-CM | POA: Diagnosis present

## 2016-12-25 DIAGNOSIS — Z9119 Patient's noncompliance with other medical treatment and regimen: Secondary | ICD-10-CM | POA: Diagnosis not present

## 2016-12-25 DIAGNOSIS — I2510 Atherosclerotic heart disease of native coronary artery without angina pectoris: Secondary | ICD-10-CM | POA: Diagnosis present

## 2016-12-25 DIAGNOSIS — I5023 Acute on chronic systolic (congestive) heart failure: Secondary | ICD-10-CM | POA: Diagnosis present

## 2016-12-25 DIAGNOSIS — E669 Obesity, unspecified: Secondary | ICD-10-CM | POA: Diagnosis present

## 2016-12-25 DIAGNOSIS — E662 Morbid (severe) obesity with alveolar hypoventilation: Secondary | ICD-10-CM

## 2016-12-25 DIAGNOSIS — E872 Acidosis: Secondary | ICD-10-CM | POA: Diagnosis present

## 2016-12-25 DIAGNOSIS — E785 Hyperlipidemia, unspecified: Secondary | ICD-10-CM | POA: Diagnosis present

## 2016-12-25 DIAGNOSIS — Z6836 Body mass index (BMI) 36.0-36.9, adult: Secondary | ICD-10-CM

## 2016-12-25 DIAGNOSIS — J440 Chronic obstructive pulmonary disease with acute lower respiratory infection: Secondary | ICD-10-CM | POA: Diagnosis present

## 2016-12-25 DIAGNOSIS — R739 Hyperglycemia, unspecified: Secondary | ICD-10-CM | POA: Diagnosis present

## 2016-12-25 DIAGNOSIS — F1010 Alcohol abuse, uncomplicated: Secondary | ICD-10-CM | POA: Diagnosis present

## 2016-12-25 DIAGNOSIS — I509 Heart failure, unspecified: Secondary | ICD-10-CM

## 2016-12-25 DIAGNOSIS — J9601 Acute respiratory failure with hypoxia: Secondary | ICD-10-CM | POA: Diagnosis present

## 2016-12-25 DIAGNOSIS — F1290 Cannabis use, unspecified, uncomplicated: Secondary | ICD-10-CM | POA: Diagnosis present

## 2016-12-25 DIAGNOSIS — J810 Acute pulmonary edema: Secondary | ICD-10-CM | POA: Diagnosis present

## 2016-12-25 DIAGNOSIS — J9602 Acute respiratory failure with hypercapnia: Secondary | ICD-10-CM | POA: Diagnosis present

## 2016-12-25 DIAGNOSIS — I428 Other cardiomyopathies: Secondary | ICD-10-CM

## 2016-12-25 DIAGNOSIS — F17210 Nicotine dependence, cigarettes, uncomplicated: Secondary | ICD-10-CM | POA: Diagnosis present

## 2016-12-25 DIAGNOSIS — F329 Major depressive disorder, single episode, unspecified: Secondary | ICD-10-CM | POA: Diagnosis present

## 2016-12-25 DIAGNOSIS — Z9114 Patient's other noncompliance with medication regimen: Secondary | ICD-10-CM

## 2016-12-25 DIAGNOSIS — E876 Hypokalemia: Secondary | ICD-10-CM | POA: Diagnosis present

## 2016-12-25 DIAGNOSIS — Z79899 Other long term (current) drug therapy: Secondary | ICD-10-CM

## 2016-12-25 DIAGNOSIS — Z7901 Long term (current) use of anticoagulants: Secondary | ICD-10-CM

## 2016-12-25 DIAGNOSIS — I447 Left bundle-branch block, unspecified: Secondary | ICD-10-CM | POA: Diagnosis present

## 2016-12-25 DIAGNOSIS — Z8249 Family history of ischemic heart disease and other diseases of the circulatory system: Secondary | ICD-10-CM

## 2016-12-25 DIAGNOSIS — Z7982 Long term (current) use of aspirin: Secondary | ICD-10-CM

## 2016-12-25 DIAGNOSIS — Z95810 Presence of automatic (implantable) cardiac defibrillator: Secondary | ICD-10-CM

## 2016-12-25 DIAGNOSIS — I420 Dilated cardiomyopathy: Secondary | ICD-10-CM | POA: Diagnosis present

## 2016-12-25 DIAGNOSIS — R0603 Acute respiratory distress: Secondary | ICD-10-CM

## 2016-12-25 DIAGNOSIS — I110 Hypertensive heart disease with heart failure: Principal | ICD-10-CM | POA: Diagnosis present

## 2016-12-25 DIAGNOSIS — I480 Paroxysmal atrial fibrillation: Secondary | ICD-10-CM | POA: Diagnosis present

## 2016-12-25 DIAGNOSIS — I340 Nonrheumatic mitral (valve) insufficiency: Secondary | ICD-10-CM | POA: Diagnosis present

## 2016-12-25 DIAGNOSIS — Z9989 Dependence on other enabling machines and devices: Secondary | ICD-10-CM

## 2016-12-25 DIAGNOSIS — G4733 Obstructive sleep apnea (adult) (pediatric): Secondary | ICD-10-CM | POA: Diagnosis present

## 2016-12-25 DIAGNOSIS — I10 Essential (primary) hypertension: Secondary | ICD-10-CM | POA: Diagnosis present

## 2016-12-25 DIAGNOSIS — J811 Chronic pulmonary edema: Secondary | ICD-10-CM

## 2016-12-25 DIAGNOSIS — F1020 Alcohol dependence, uncomplicated: Secondary | ICD-10-CM

## 2016-12-25 HISTORY — DX: Acute on chronic combined systolic (congestive) and diastolic (congestive) heart failure: I50.43

## 2016-12-25 HISTORY — DX: Chronic obstructive pulmonary disease, unspecified: J44.9

## 2016-12-25 LAB — RESPIRATORY PANEL BY PCR
Adenovirus: NOT DETECTED
BORDETELLA PERTUSSIS-RVPCR: NOT DETECTED
CHLAMYDOPHILA PNEUMONIAE-RVPPCR: NOT DETECTED
CORONAVIRUS HKU1-RVPPCR: NOT DETECTED
Coronavirus 229E: NOT DETECTED
Coronavirus NL63: NOT DETECTED
Coronavirus OC43: NOT DETECTED
INFLUENZA A-RVPPCR: NOT DETECTED
Influenza B: NOT DETECTED
METAPNEUMOVIRUS-RVPPCR: NOT DETECTED
Mycoplasma pneumoniae: NOT DETECTED
PARAINFLUENZA VIRUS 2-RVPPCR: NOT DETECTED
PARAINFLUENZA VIRUS 3-RVPPCR: NOT DETECTED
PARAINFLUENZA VIRUS 4-RVPPCR: NOT DETECTED
Parainfluenza Virus 1: NOT DETECTED
RESPIRATORY SYNCYTIAL VIRUS-RVPPCR: NOT DETECTED
RHINOVIRUS / ENTEROVIRUS - RVPPCR: NOT DETECTED

## 2016-12-25 LAB — I-STAT ARTERIAL BLOOD GAS, ED
ACID-BASE EXCESS: 4 mmol/L — AB (ref 0.0–2.0)
Acid-base deficit: 1 mmol/L (ref 0.0–2.0)
BICARBONATE: 25.9 mmol/L (ref 20.0–28.0)
BICARBONATE: 28.3 mmol/L — AB (ref 20.0–28.0)
O2 SAT: 95 %
O2 Saturation: 92 %
TCO2: 28 mmol/L (ref 0–100)
TCO2: 30 mmol/L (ref 0–100)
pCO2 arterial: 53.8 mmHg — ABNORMAL HIGH (ref 32.0–48.0)
pCO2 arterial: 40.8 mmHg (ref 32.0–48.0)
pH, Arterial: 7.29 — ABNORMAL LOW (ref 7.350–7.450)
pH, Arterial: 7.45 (ref 7.350–7.450)
pO2, Arterial: 72 mmHg — ABNORMAL LOW (ref 83.0–108.0)
pO2, Arterial: 70 mmHg — ABNORMAL LOW (ref 83.0–108.0)

## 2016-12-25 LAB — I-STAT CHEM 8, ED
BUN: 13 mg/dL (ref 6–20)
CALCIUM ION: 1.21 mmol/L (ref 1.15–1.40)
CHLORIDE: 104 mmol/L (ref 101–111)
Creatinine, Ser: 0.9 mg/dL (ref 0.61–1.24)
Glucose, Bld: 224 mg/dL — ABNORMAL HIGH (ref 65–99)
HCT: 41 % (ref 39.0–52.0)
HEMOGLOBIN: 13.9 g/dL (ref 13.0–17.0)
Potassium: 4.1 mmol/L (ref 3.5–5.1)
SODIUM: 141 mmol/L (ref 135–145)
TCO2: 28 mmol/L (ref 0–100)

## 2016-12-25 LAB — CBC WITH DIFFERENTIAL/PLATELET
BASOS ABS: 0 10*3/uL (ref 0.0–0.1)
Basophils Relative: 0 %
Eosinophils Absolute: 0.3 10*3/uL (ref 0.0–0.7)
Eosinophils Relative: 2 %
HEMATOCRIT: 41.5 % (ref 39.0–52.0)
HEMOGLOBIN: 13.8 g/dL (ref 13.0–17.0)
LYMPHS ABS: 4.7 10*3/uL — AB (ref 0.7–4.0)
Lymphocytes Relative: 40 %
MCH: 31.4 pg (ref 26.0–34.0)
MCHC: 33.3 g/dL (ref 30.0–36.0)
MCV: 94.3 fL (ref 78.0–100.0)
MONO ABS: 0.8 10*3/uL (ref 0.1–1.0)
MONOS PCT: 6 %
NEUTROS PCT: 52 %
Neutro Abs: 6.1 10*3/uL (ref 1.7–7.7)
Platelets: 259 10*3/uL (ref 150–400)
RBC: 4.4 MIL/uL (ref 4.22–5.81)
RDW: 13.9 % (ref 11.5–15.5)
WBC: 11.8 10*3/uL — ABNORMAL HIGH (ref 4.0–10.5)

## 2016-12-25 LAB — COMPREHENSIVE METABOLIC PANEL
ALK PHOS: 105 U/L (ref 38–126)
ALT: 40 U/L (ref 17–63)
AST: 34 U/L (ref 15–41)
Albumin: 4.2 g/dL (ref 3.5–5.0)
Anion gap: 13 (ref 5–15)
BILIRUBIN TOTAL: 1.1 mg/dL (ref 0.3–1.2)
BUN: 12 mg/dL (ref 6–20)
CHLORIDE: 103 mmol/L (ref 101–111)
CO2: 24 mmol/L (ref 22–32)
CREATININE: 0.96 mg/dL (ref 0.61–1.24)
Calcium: 9.3 mg/dL (ref 8.9–10.3)
GFR calc Af Amer: >60 mL/min (ref >60)
Glucose, Bld: 224 mg/dL — ABNORMAL HIGH (ref 65–99)
Potassium: 4 mmol/L (ref 3.5–5.1)
Sodium: 140 mmol/L (ref 135–145)
TOTAL PROTEIN: 7.1 g/dL (ref 6.5–8.1)

## 2016-12-25 LAB — INFLUENZA PANEL BY PCR (TYPE A & B)
INFLAPCR: NEGATIVE
INFLBPCR: NEGATIVE

## 2016-12-25 LAB — I-STAT CG4 LACTIC ACID, ED: LACTIC ACID, VENOUS: 3.26 mmol/L — AB (ref 0.5–1.9)

## 2016-12-25 LAB — LACTIC ACID, PLASMA: LACTIC ACID, VENOUS: 1.4 mmol/L (ref 0.5–1.9)

## 2016-12-25 LAB — TROPONIN I
Troponin I: 0.07 ng/mL (ref <0.03)
Troponin I: 0.07 ng/mL (ref <0.03)

## 2016-12-25 LAB — I-STAT TROPONIN, ED: TROPONIN I, POC: 0.03 ng/mL (ref 0.00–0.08)

## 2016-12-25 LAB — BRAIN NATRIURETIC PEPTIDE: B Natriuretic Peptide: 1193.9 pg/mL — ABNORMAL HIGH (ref 0.0–100.0)

## 2016-12-25 LAB — TSH: TSH: 1.15 u[IU]/mL (ref 0.350–4.500)

## 2016-12-25 MED ORDER — SENNOSIDES-DOCUSATE SODIUM 8.6-50 MG PO TABS: 1.0000 | ORAL_TABLET | Freq: Every evening | ORAL | Status: DC | PRN

## 2016-12-25 MED ORDER — PAROXETINE HCL 20 MG PO TABS
20.0000 mg | ORAL_TABLET | Freq: Every day | ORAL | Status: DC
Start: 2016-12-25 — End: 2016-12-27
  Administered 2016-12-25 – 2016-12-27 (×3): 20 mg via ORAL
  Filled 2016-12-25 (×3): qty 1

## 2016-12-25 MED ORDER — PANTOPRAZOLE SODIUM 40 MG PO TBEC
40.0000 mg | DELAYED_RELEASE_TABLET | Freq: Every day | ORAL | Status: DC
Start: 2016-12-25 — End: 2016-12-27
  Administered 2016-12-25 – 2016-12-27 (×3): 40 mg via ORAL
  Filled 2016-12-25 (×3): qty 1

## 2016-12-25 MED ORDER — ASPIRIN EC 81 MG PO TBEC
81.0000 mg | DELAYED_RELEASE_TABLET | Freq: Every day | ORAL | Status: DC
  Administered 2016-12-25 – 2016-12-27 (×3): 81 mg via ORAL
  Filled 2016-12-25 (×3): qty 1

## 2016-12-25 MED ORDER — FUROSEMIDE 10 MG/ML IJ SOLN
80.0000 mg | Freq: Two times a day (BID) | INTRAMUSCULAR | Status: DC
  Administered 2016-12-25 – 2016-12-26 (×2): 80 mg via INTRAVENOUS
  Filled 2016-12-25 (×2): qty 8

## 2016-12-25 MED ORDER — NITROGLYCERIN IN D5W 200-5 MCG/ML-% IV SOLN
INTRAVENOUS | Status: AC
  Filled 2016-12-25: qty 250

## 2016-12-25 MED ORDER — LORAZEPAM 2 MG/ML IJ SOLN: 1.0000 mg | Freq: Four times a day (QID) | INTRAMUSCULAR | Status: DC | PRN

## 2016-12-25 MED ORDER — SPIRONOLACTONE 25 MG PO TABS
12.5000 mg | ORAL_TABLET | Freq: Every day | ORAL | Status: DC
  Administered 2016-12-25 – 2016-12-26 (×2): 12.5 mg via ORAL
  Filled 2016-12-25 (×3): qty 1

## 2016-12-25 MED ORDER — DEXTROSE 5 % IV SOLN
500.0000 mg | Freq: Once | INTRAVENOUS | Status: AC
  Administered 2016-12-25: 500 mg via INTRAVENOUS
  Filled 2016-12-25: qty 500

## 2016-12-25 MED ORDER — LISINOPRIL 10 MG PO TABS
10.0000 mg | ORAL_TABLET | Freq: Every day | ORAL | Status: DC
  Administered 2016-12-25 – 2016-12-26 (×2): 10 mg via ORAL
  Filled 2016-12-25 (×3): qty 1

## 2016-12-25 MED ORDER — ADULT MULTIVITAMIN W/MINERALS CH
1.0000 | ORAL_TABLET | Freq: Every day | ORAL | Status: DC
  Administered 2016-12-25 – 2016-12-27 (×3): 1 via ORAL
  Filled 2016-12-25 (×3): qty 1

## 2016-12-25 MED ORDER — AMIODARONE HCL 200 MG PO TABS
200.0000 mg | ORAL_TABLET | Freq: Every day | ORAL | Status: DC
Start: 2016-12-25 — End: 2016-12-27
  Administered 2016-12-25 – 2016-12-27 (×3): 200 mg via ORAL
  Filled 2016-12-25 (×3): qty 1

## 2016-12-25 MED ORDER — ACETAMINOPHEN 650 MG RE SUPP: 650.0000 mg | Freq: Four times a day (QID) | RECTAL | Status: DC | PRN

## 2016-12-25 MED ORDER — RIVAROXABAN 20 MG PO TABS
20.0000 mg | ORAL_TABLET | Freq: Every day | ORAL | Status: DC
  Administered 2016-12-25 – 2016-12-26 (×2): 20 mg via ORAL
  Filled 2016-12-25 (×2): qty 1

## 2016-12-25 MED ORDER — ORAL CARE MOUTH RINSE
15.0000 mL | Freq: Two times a day (BID) | OROMUCOSAL | Status: DC
  Administered 2016-12-26: 15 mL via OROMUCOSAL

## 2016-12-25 MED ORDER — FUROSEMIDE 10 MG/ML IJ SOLN
40.0000 mg | Freq: Once | INTRAMUSCULAR | Status: AC
  Administered 2016-12-25: 40 mg via INTRAVENOUS
  Filled 2016-12-25: qty 4

## 2016-12-25 MED ORDER — ACETAMINOPHEN 325 MG PO TABS
650.0000 mg | ORAL_TABLET | Freq: Four times a day (QID) | ORAL | Status: DC | PRN
  Administered 2016-12-25 – 2016-12-26 (×2): 650 mg via ORAL
  Filled 2016-12-25 (×2): qty 2

## 2016-12-25 MED ORDER — SODIUM CHLORIDE 0.9% FLUSH
3.0000 mL | Freq: Two times a day (BID) | INTRAVENOUS | Status: DC
  Administered 2016-12-25 – 2016-12-27 (×3): 3 mL via INTRAVENOUS

## 2016-12-25 MED ORDER — DEXTROSE 5 % IV SOLN
1.0000 g | Freq: Once | INTRAVENOUS | Status: AC
  Administered 2016-12-25: 1 g via INTRAVENOUS
  Filled 2016-12-25: qty 10

## 2016-12-25 MED ORDER — LORAZEPAM 1 MG PO TABS: 1.0000 mg | ORAL_TABLET | Freq: Four times a day (QID) | ORAL | Status: DC | PRN

## 2016-12-25 MED ORDER — LORAZEPAM 2 MG/ML IJ SOLN
INTRAMUSCULAR | Status: AC
  Filled 2016-12-25: qty 1

## 2016-12-25 MED ORDER — NITROGLYCERIN IN D5W 200-5 MCG/ML-% IV SOLN
0.0000 ug/min | Freq: Once | INTRAVENOUS | Status: AC
  Administered 2016-12-25: 200 ug/min via INTRAVENOUS

## 2016-12-25 MED ORDER — FOLIC ACID 1 MG PO TABS
1.0000 mg | ORAL_TABLET | Freq: Every day | ORAL | Status: DC
  Administered 2016-12-25 – 2016-12-27 (×3): 1 mg via ORAL
  Filled 2016-12-25 (×3): qty 1

## 2016-12-25 MED ORDER — ATORVASTATIN CALCIUM 80 MG PO TABS
80.0000 mg | ORAL_TABLET | Freq: Every day | ORAL | Status: DC
  Administered 2016-12-25 – 2016-12-26 (×2): 80 mg via ORAL
  Filled 2016-12-25 (×2): qty 1

## 2016-12-25 MED ORDER — THIAMINE HCL 100 MG/ML IJ SOLN: 100.0000 mg | Freq: Every day | INTRAMUSCULAR | Status: DC

## 2016-12-25 MED ORDER — VITAMIN B-1 100 MG PO TABS
100.0000 mg | ORAL_TABLET | Freq: Every day | ORAL | Status: DC
Start: 2016-12-25 — End: 2016-12-27
  Administered 2016-12-25 – 2016-12-27 (×3): 100 mg via ORAL
  Filled 2016-12-25 (×3): qty 1

## 2016-12-25 MED ORDER — LORAZEPAM 2 MG/ML IJ SOLN
1.0000 mg | Freq: Once | INTRAMUSCULAR | Status: AC
  Administered 2016-12-25: 1 mg via INTRAVENOUS

## 2016-12-25 NOTE — Care Management Note (Signed)
Case Management Note  Patient Details  Name: [name] [name] MRN: [id number] Date of Birth: October 24, 1960  Subjective/Objective:                  56 yo Patient from home with respiratory distress.  [facility] placed patient on CPAP en route to ED, in which patient was not able to tolerate and ended on NRB with neb of 5mg  albuterol.  Patient denies any chest pain.   Patient given one SL nitro en route by EMS with no effect.  Patient having one week of increased shortness of breath.  Spoke to PCP.  Patient is anxious, extremely diaphoretic and breathing over 50 times a minute upon arrival to ED.  Action/Plan: Admit status INPATIENT (RESPIRATORY DISTRESS); anticipate discharge HOME WITH SELF CARE.   Expected Discharge Date:   (unsure)               Expected Discharge Plan:  Home/Self Care  In-House Referral:  NA  Discharge planning Services  CM Consult  Post Acute Care Choice:    Choice offered to:     DME Arranged:    DME Agency:     HH Arranged:    HH Agency:     Status of Service:  In process, will continue to follow  If discussed at Long Length of Stay Meetings, dates discussed:    Additional Comments:  Oletta Cohn, RN 12/25/2016, 9:48 AM

## 2016-12-25 NOTE — Progress Notes (Signed)
Pt received from ED. Pt oriented to room and equipment. Call bell within reach.   Leonidas Romberg, RN

## 2016-12-25 NOTE — ED Notes (Signed)
Placed pt in hospital bed for comfort. Denies sob or pain.

## 2016-12-25 NOTE — Progress Notes (Signed)
RT placed patient on CPAP HS. 2L O2 bleed in needed. Patient tolerating well at this time. 

## 2016-12-25 NOTE — ED Notes (Signed)
Paged Dr Marlena Clipper inquiring about downgrading pt to tele bed. Informed him the pt is off of bipap and has been for a few hours. Resp e/u. 02 2L [place] and sats at 99%. Drinking without issue. Per Dr Marlena Clipper, sounds like this would be appropriate. Dr Lawerance Bach paged to change order

## 2016-12-25 NOTE — ED Notes (Signed)
Dr Adela Lank given a copy of lactic acid 3.26

## 2016-12-25 NOTE — ED Provider Notes (Signed)
[facility]-EMERGENCY DEPT Provider Note   CSN: [id number] Arrival date & time: 12/25/16  0546     History   Chief Complaint Chief Complaint  Patient presents with  . Respiratory Distress    HPI [name] [name] is a 56 y.o. male.  56 yo M with a chief complaint of shortness of breath. Is been going on for the past week. Coughing up some brown sputum. Really short of breath on exertion for the past couple days. Called EMS because it became acutely worse this evening. Upon their arrival he was only able to stand up and became very short of breath. He then was noted to become very diaphoretic and tachypnic. He denied chest pain. Level V caveat acuity of condition.   The history is provided by the patient.  Shortness of Breath  This is a new problem. The average episode lasts 1 week. The problem occurs continuously.The current episode started more than 1 week ago. The problem has been rapidly worsening. Associated symptoms include cough and orthopnea. Pertinent negatives include no fever, no headaches, no chest pain, no vomiting, no abdominal pain and no rash. He has tried nothing for the symptoms. The treatment provided no relief. He has had prior hospitalizations. He has had no prior ED visits. He has had no prior ICU admissions. Associated medical issues include COPD, CAD, heart failure and past MI. Associated medical issues do not include PE.    Past Medical History:  Diagnosis Date  . COPD (chronic obstructive pulmonary disease) (HCC)     There are no active problems to display for this patient.   History reviewed. No pertinent surgical history.     Home Medications    Prior to Admission medications   Not on File    Family History History reviewed. No pertinent family history.  Social History Social History  Substance Use Topics  . Smoking status: Unknown If Ever Smoked  . Smokeless tobacco: Not on file  . Alcohol use No     Allergies   Patient has no known  allergies.   Review of Systems Review of Systems  Constitutional: Positive for diaphoresis. Negative for chills and fever.  HENT: Negative for congestion and facial swelling.   Eyes: Negative for discharge and visual disturbance.  Respiratory: Positive for cough and shortness of breath.   Cardiovascular: Positive for orthopnea. Negative for chest pain and palpitations.  Gastrointestinal: Negative for abdominal pain, diarrhea and vomiting.  Musculoskeletal: Negative for arthralgias and myalgias.  Skin: Negative for color change and rash.  Neurological: Negative for tremors, syncope and headaches.  Psychiatric/Behavioral: Negative for confusion and dysphoric mood.     Physical Exam Updated Vital Signs BP (!) 145/93   Pulse 93   Resp (!) 35   SpO2 96%   Physical Exam  Constitutional: He is oriented to person, place, and time. He appears well-developed and well-nourished.  HENT:  Head: Normocephalic and atraumatic.  Eyes: EOM are normal. Pupils are equal, round, and reactive to light.  Neck: Normal range of motion. Neck supple. No JVD present.  Cardiovascular: Regular rhythm.  Tachycardia present.  Exam reveals no gallop and no friction rub.   No murmur heard. Pulmonary/Chest: He is in respiratory distress. He has no wheezes.  Diminished in lower fields bilaterally.   Abdominal: He exhibits no distension and no mass. There is no tenderness. There is no rebound and no guarding.  Musculoskeletal: Normal range of motion. He exhibits edema (trace edema to bilateral lower extremities).  Neurological: He is  alert and oriented to person, place, and time.  Skin: No rash noted. He is diaphoretic. No pallor.  Psychiatric: He has a normal mood and affect. His behavior is normal.  Nursing note and vitals reviewed.    ED Treatments / Results  Labs (all labs ordered are listed, but only abnormal results are displayed) Labs Reviewed  CBC WITH DIFFERENTIAL/PLATELET - Abnormal; Notable for  the following:       Result Value   WBC 11.8 (*)    Lymphs Abs 4.7 (*)    All other components within normal limits  I-STAT CHEM 8, ED - Abnormal; Notable for the following:    Glucose, Bld 224 (*)    All other components within normal limits  I-STAT CG4 LACTIC ACID, ED - Abnormal; Notable for the following:    Lactic Acid, Venous 3.26 (*)    All other components within normal limits  I-STAT ARTERIAL BLOOD GAS, ED - Abnormal; Notable for the following:    pH, Arterial 7.290 (*)    pCO2 arterial 53.8 (*)    pO2, Arterial 72.0 (*)    All other components within normal limits  CULTURE, BLOOD (ROUTINE X 2)  CULTURE, BLOOD (ROUTINE X 2)  BRAIN NATRIURETIC PEPTIDE  COMPREHENSIVE METABOLIC PANEL  I-STAT TROPOININ, ED    EKG  EKG Interpretation  Date/Time:  Monday December 25 2016 06:00:54 EDT Ventricular Rate:  108 PR Interval:    QRS Duration: 138 QT Interval:  372 QTC Calculation: 478 R Axis:   -69 Text Interpretation:  Sinus tachycardia Supraventricular bigeminy LAE, consider biatrial enlargement Nonspecific IVCD with LAD No significant change since last tracing Confirmed by Nataley Bahri MD, DANIEL [phone]) on 12/25/2016 6:44:28 AM       Radiology Dg Chest Port 1 View  Result Date: 12/25/2016 CLINICAL DATA:  56 y/o  M; respiratory distress. EXAM: PORTABLE CHEST 1 VIEW COMPARISON:  None. FINDINGS: Enlarged cardiac silhouette. Diffuse patchy opacification of the lungs probably representing moderate pulmonary edema. Pneumonia is not excluded. No large pleural effusion. No pneumothorax. Pacemaker noted. Bones are unremarkable. IMPRESSION: Enlarged cardiac silhouette. Diffuse patchy opacification of lungs probably represents moderate pulmonary edema. Underlying pneumonia is not excluded. Electronically Signed   By: Mitzi Hansen M.D.   On: 12/25/2016 06:05    Procedures Procedures (including critical care time)  Medications Ordered in ED Medications  cefTRIAXone (ROCEPHIN) 1 g in  dextrose 5 % 50 mL IVPB (not administered)  azithromycin (ZITHROMAX) 500 mg in dextrose 5 % 250 mL IVPB (not administered)  furosemide (LASIX) injection 40 mg (not administered)  LORazepam (ATIVAN) injection 1 mg (1 mg Intravenous Given 12/25/16 0553)  nitroGLYCERIN 50 mg in dextrose 5 % 250 mL (0.2 mg/mL) infusion (25 mcg/kg/min Intravenous Rate/Dose Change 12/25/16 0616)     Initial Impression / Assessment and Plan / ED Course  I have reviewed the triage vital signs and the nursing notes.  Pertinent labs & imaging results that were available during my care of the patient were reviewed by me and considered in my medical decision making (see chart for details).     56 yo M With a chief complaint of shortness of breath on exertion. Patient arrived to the ED in what appeared to be acute pulmonary edema. Patient had significant improvement on BiPAP and on a nitroglycerin drip. His blood pressures were titrated down with a high-dose nitroglycerin drip into the 130 systolic range. Patient was feeling significantly better at that time. Still remains mildly tachypnic.  Will discuss with  medicine for stepdown eval.  Taken off of nitro gtt.  Seems much more comfortable Tachypnea resolved.   CRITICAL CARE Performed by: Rae Roam   Total critical care time: 80 minutes  Critical care time was exclusive of separately billable procedures and treating other patients.  Critical care was necessary to treat or prevent imminent or life-threatening deterioration.  Critical care was time spent personally by me on the following activities: development of treatment plan with patient and/or surrogate as well as nursing, discussions with consultants, evaluation of patient's response to treatment, examination of patient, obtaining history from patient or surrogate, ordering and performing treatments and interventions, ordering and review of laboratory studies, ordering and review of radiographic studies,  pulse oximetry and re-evaluation of patient's condition.  The patients results and plan were reviewed and discussed.   Any x-rays performed were independently reviewed by myself.   Differential diagnosis were considered with the presenting HPI.  Medications  cefTRIAXone (ROCEPHIN) 1 g in dextrose 5 % 50 mL IVPB (1 g Intravenous New Bag/Given 12/25/16 0653)  azithromycin (ZITHROMAX) 500 mg in dextrose 5 % 250 mL IVPB (not administered)  LORazepam (ATIVAN) injection 1 mg (1 mg Intravenous Given 12/25/16 0553)  nitroGLYCERIN 50 mg in dextrose 5 % 250 mL (0.2 mg/mL) infusion (0 mcg/min Intravenous Stopped 12/25/16 0706)  furosemide (LASIX) injection 40 mg (40 mg Intravenous Given 12/25/16 0657)    Vitals:   12/25/16 0624 12/25/16 0640 12/25/16 0650 12/25/16 0700  BP: (!) 145/93 136/87 122/77 108/68  Pulse: 93 92 (!) 44 85  Resp: (!) 35 (!) 28 (!) 35 (!) 34  SpO2: 96% 95% 97% 95%    Final diagnoses:  Acute pulmonary edema (HCC)  Hypertensive emergency    Admission/ observation were discussed with the admitting physician, patient and/or family and they are comfortable with the plan.    Final Clinical Impressions(s) / ED Diagnoses   Final diagnoses:  Acute pulmonary edema [facility])  Hypertensive emergency    New Prescriptions New Prescriptions   No medications on file     Melene Plan, DO 12/25/16 3149

## 2016-12-25 NOTE — ED Triage Notes (Signed)
Patient from home with respiratory distress.  [facility] placed patient on CPAP en route to ED, in which patient was not able to tolerate and ended on NRB with neb of 5mg  albuterol.  Patient denies any chest pain.   Patient given one SL nitro en route by EMS with no effect.  Patient having one week of increased shortness of breath.  Spoke to PCP.  Patient is anxious, extremely diaphoretic and breathing over 50 times a minute upon arrival to ED.  Patient refuses intubation at this time.

## 2016-12-25 NOTE — Progress Notes (Signed)
Patient taken off of bipap and placed on 2L nasal cannula.  Currently tolerating well.  Will continue to monitor.   

## 2016-12-25 NOTE — ED Notes (Signed)
Admitting team in for eval

## 2016-12-25 NOTE — H&P (Signed)
Date: 12/25/2016               Patient Name:  [name] [name] MRN: [id number]  DOB: Jul 31, 1961 Age / Sex: 56 y.o.,  male   PCP: [name], MD         Medical Service: Internal Medicine Teaching Service         Attending Physician: Dr. Levert Feinstein, MD    First Contact: Dr. Mikey Bussing Pager: [phone]  Second Contact: Dr. Allena Katz Pager: [phone]       After Hours (After 5p/  First Contact Pager: [phone]  weekends / holidays): Second Contact Pager: [phone]   Chief Complaint: shortness of breath  History of Present Illness: [name] [name] is a 56 yo M with PMHx of Chronic Systolic HF (EF 20-25% in 2016), Dilated NICM s/p ICD, PAF non-compliant with Xarelto, Alcohol Abuse, Tobacco Abuse, OSA on CPAP and HTN Who presents to the ED via EMS for shortness of breath.  Patient states that for the last 1 month he has experienced increasing shortness of breath occurring primarily in the mornings. He states that this gradually improves throughout the day as he takes his medications. Prior to this month, he has experienced chronic dyspnea on exertion for the past 2 years. This is not changed. Last night, patient went to bed around 10 PM feeling normal. He awoke around 2:30 AM to use the restroom and felt short of breath. He was able to fall back asleep, but awoke again at 3:30 with shortness of breath. His shortness of breath was worse than it had been recently. At that point, he called EMS. EMS found him in respiratory distress and administered 5 mg of albuterol and sublingual nitroglycerin without improvement. He was placed on a BiPAP. Patient admits to some wheezing. He has also had nasal congestion and a mild productive cough of gray sputum for the past 3 days. His shortness of breath is also associated with orthopnea and stable dyspnea on exertion. Patient denies any associated chest pain, weight gain, lower extremity edema, fever, chills, nausea, vomiting, diarrhea. He reports his weight has been  stable. He reports compliance with his Lasix, lisinopril, spironolactone, but states he has not been taking his carvedilol as he has run out of it and takes his overall though every other day as it makes him feel poorly. Since being in the in the emergency department, patient reports that he feels much better.  In the emergency department, patient was found to be diaphoretic, tachypnea At 28, hypertensive at 155/119 and tachycardic at 110. He was satting 96% on BiPAP. BNP elevated greater than 1000. Troponin normal. Chest x-ray showed diffuse opacities bilaterally consistent with moderate pulmonary edema. EKG with sinus tachycardia and supraventricular bigeminy. Patient was continued on BiPAP, given Lasix 40 mg IV in addition to the 80 mg by mouth patient took this morning and placed on a nitroglycerin drip. Blood pressure improved to 108/68 and respiratory status improved.   Meds: No current facility-administered medications for this encounter.    No current outpatient prescriptions on file.    Allergies: Allergies as of 12/25/2016  . (No Known Allergies)   Past Medical History:  Diagnosis Date  . COPD (chronic obstructive pulmonary disease) (HCC)     Family History:  Mother:  HTN, T2DM, CAD Father: Atrial Fibrillation  Social History:  Tobacco Use: 3 cigarettes/day for 10 years Alcohol Use: 6 beers per day Illicit Drug Use: Marijuana  Review of Systems: A complete ROS was reviewed  and negative except as per HPI.   Physical Exam: Blood pressure 113/81, pulse 83, resp. rate (!) 33, SpO2 100 %. General: Vital signs reviewed.  Patient is on BiPAP, in no acute distress and cooperative with exam.  Eyes: PERRL, conjunctivae normal, no scleral icterus.  Ears, Nose, Throat, and Mouth: Normal posterior oropharynx. Moist mucous membranes.  Cardiovascular: RRR,  no murmurs, gallops, or rubs. + JVD. No lower extremity edema bilaterally. Bilateral radial and pedal pulses are intact and  symmetric bilaterally.  Pulmonary: Diffuse inspiratory fine crackles, no wheezes. No accessory muscle use. Speaking in full sentences. Gastrointestinal: Soft, non-tender, distended, BS +, no guarding present. +Ventral hernia Neurologic: A&O x3, moving all extremities, non-focal. Skin: Warm, dry and intact. No rashes or erythema. Psychiatric: Normal mood and affect. speech and behavior is normal. Cognition and memory are normal.   EKG: Sinus tachycardia with supraventricular bigeminy. Similar to prior from October 2017.   CXR: Bilateral opacities consistent with pulmonary edema. I have independently reviewed this film.   Assessment & Plan by Problem: Principal Problem:   Acute respiratory failure with hypoxia and hypercarbia (HCC) Active Problems:   Paroxysmal atrial fibrillation (HCC)   HTN (hypertension)   Obesity   Tobacco abuse   Alcohol abuse   OSA (obstructive sleep apnea)   CAD (coronary artery disease)   Dilated cardiomyopathy (HCC)   ICD (implantable cardioverter-defibrillator) in place   Hyperglycemia   Pulmonary edema  [name] [name] is a 56 yo M with PMHx of Chronic Systolic HF (EF 20-25% in 2016), Dilated NICM s/p ICD, PAF non-compliant with Xarelto, Alcohol Abuse, Tobacco Abuse, OSA on CPAP and HTN Who presents to the ED via EMS for shortness of breath.  Acute Respiratory Failure with Hypercarbia: Last echo showed EF 20-25% in 2016. Patient presents with worsening shortness of breath for the last 1 month and dyspnea on exertion. He developed respiratory distress this morning requiring BiPAP, Lasix, nitroglycerin. Clinically, patient appears to have developed pulmonary edema, but does not appear grossly volume overloaded. This is likely secondary to medication noncompliance, heart failure exacerbation or hypertensive urgency. ABG showed pH 7.290, PCO2 53.8 and PO2 72. BNP 1193 and chest x-ray with diffuse opacities consistent with pulmonary edema. Patient states his normal baseline  weight is 300. At an office visit in February 2018, weight was 304 pounds. We do not yet have a weight here. I doubt ACS given lack of chest pain, EKG without acute ischemic changes and normal troponin. I doubt pulmonary embolism given his presentation, however patient has been noncompliant with Xarelto. -Admit to stepdown unit -Wean off of BiPAP -Wean off of nitroglycerin drip -Restart home spironolactone 12.5 mg daily, lisinopril 10 mg daily, aspirin 81 mg daily -Hold carvedilol given noncompliance at home and acute decompensation -Lasix 80 mg IV twice a day -Strict I's and O's -Daily weights -Repeat echocardiogram -Repeat chest x-ray tomorrow morning -Daily basic metabolic panel -Check TSH -Trend troponins -Repeat ABG  Possible URI: Patient reports a 5 day history of nasal congestion, sore throat, mildly productive cough of gray sputum and occasional wheezing. Chest x-ray could not exclude pneumonia. Patient has a mild leukocytosis of 11.8. We have not checked his temperature yet. A URI could be contributing to patient's symptoms. He was started on ceftriaxone and azithromycin in the emergency department for pneumonia, but I do not feel that his symptoms are consistent with pneumonia. -Hold antibiotics -Repeat 2 view chest x-ray tomorrow morning -Follow clinically -Respiratory viral panel and influenza -BCx pending  Paroxysmal  atrial fibrillation: Patient is in sinus rhythm. He reports compliance with amiodarone 200 mg daily. He reports taking his Xarelto 20 mg every other day as it makes him feel poorly. -Continue amiodarone 200 mg daily -Holding home carvedilol given noncompliance and acute heart failure decompensation -Continue Xarelto 20 mg daily  Lactic acidosis: Lactic acid 3.26 on arrival, likely secondary to hypoxia. -Repeat lactic acid  Hyperlipidemia: Patient reports compliance with atorvastatin 80 mg daily. -Continue atorvastatin 80 mg daily  Hypertension: Patient  reports compliance with lisinopril 5 mg twice a day and spironolactone 12.5 mg daily. He reports he has not been taking his carvedilol. -Continue lisinopril and spironolactone -Hold carvedilol -Wean off of nitroglycerin drip  Depression: Patient reports compliance with proximal 1820 mg daily. -Continue proximal 1820 mg daily  OSA: Patient reports compliance with nightly CPAP. -Currently on BiPAP -CPAP daily at bedtime  Hyperglycemia: Admitting glucose 224. Previous hemoglobin A1c was 5.3 in 2016. -Nothing by mouth while on BiPAP -Check hemoglobin A1c  Alcohol abuse: Patient reports 6 beers per day. -CIWA  Tobacco abuse: Patient reports smoking 3 cigarettes per day for the past 10 years. -Provided counseling on smoking cessation  FEN: Nothing by mouth Code: Full DVT/PE ppx: Xarelto   Dispo: Admit patient to Inpatient with expected length of stay greater than 2 midnights.  Signed: Karlene Lineman, DO PGY-3 Internal Medicine Resident Pager # [phone] 12/25/2016 9:28 AM

## 2016-12-25 NOTE — Progress Notes (Addendum)
CRITICAL VALUE ALERT  Critical value received:  Troponin 0.07  Date of notification:  12/25/16  Time of notification:  1553  Critical value read back:Yes.    Nurse who received alert:  Inetta Fermo  MD notified (1st page):  Dr. Allena Katz  Time of first page:  1635  MD notified (2nd page):  Time of second page:  Responding MD:    Time MD responded:    Pt asymptomatic. Denies CP. No telemetry changes noted.   1700 - Attending at bedside

## 2016-12-26 ENCOUNTER — Inpatient Hospital Stay (HOSPITAL_COMMUNITY): Payer: Medicaid Other

## 2016-12-26 ENCOUNTER — Encounter (HOSPITAL_COMMUNITY): Payer: Self-pay

## 2016-12-26 DIAGNOSIS — I509 Heart failure, unspecified: Secondary | ICD-10-CM

## 2016-12-26 DIAGNOSIS — J9601 Acute respiratory failure with hypoxia: Secondary | ICD-10-CM

## 2016-12-26 DIAGNOSIS — I110 Hypertensive heart disease with heart failure: Principal | ICD-10-CM

## 2016-12-26 DIAGNOSIS — J9602 Acute respiratory failure with hypercapnia: Secondary | ICD-10-CM

## 2016-12-26 DIAGNOSIS — I5043 Acute on chronic combined systolic (congestive) and diastolic (congestive) heart failure: Secondary | ICD-10-CM

## 2016-12-26 DIAGNOSIS — G4733 Obstructive sleep apnea (adult) (pediatric): Secondary | ICD-10-CM

## 2016-12-26 DIAGNOSIS — I5023 Acute on chronic systolic (congestive) heart failure: Secondary | ICD-10-CM

## 2016-12-26 DIAGNOSIS — I420 Dilated cardiomyopathy: Secondary | ICD-10-CM

## 2016-12-26 LAB — BASIC METABOLIC PANEL
ANION GAP: 8 (ref 5–15)
BUN: 14 mg/dL (ref 6–20)
CO2: 30 mmol/L (ref 22–32)
Calcium: 9 mg/dL (ref 8.9–10.3)
Chloride: 101 mmol/L (ref 101–111)
Creatinine, Ser: 0.84 mg/dL (ref 0.61–1.24)
GFR calc Af Amer: >60 mL/min (ref >60)
GFR calc non Af Amer: >60 mL/min (ref >60)
GLUCOSE: 124 mg/dL — AB (ref 65–99)
POTASSIUM: 3.1 mmol/L — AB (ref 3.5–5.1)
Sodium: 139 mmol/L (ref 135–145)

## 2016-12-26 LAB — BLOOD CULTURE ID PANEL (REFLEXED)
Acinetobacter baumannii: NOT DETECTED
CANDIDA GLABRATA: NOT DETECTED
CANDIDA KRUSEI: NOT DETECTED
CANDIDA PARAPSILOSIS: NOT DETECTED
CANDIDA TROPICALIS: NOT DETECTED
Candida albicans: NOT DETECTED
ENTEROBACTER CLOACAE COMPLEX: NOT DETECTED
ESCHERICHIA COLI: NOT DETECTED
Enterobacteriaceae species: NOT DETECTED
Enterococcus species: NOT DETECTED
Haemophilus influenzae: NOT DETECTED
KLEBSIELLA OXYTOCA: NOT DETECTED
KLEBSIELLA PNEUMONIAE: NOT DETECTED
LISTERIA MONOCYTOGENES: NOT DETECTED
Methicillin resistance: NOT DETECTED
Neisseria meningitidis: NOT DETECTED
PROTEUS SPECIES: NOT DETECTED
Pseudomonas aeruginosa: NOT DETECTED
SERRATIA MARCESCENS: NOT DETECTED
STREPTOCOCCUS AGALACTIAE: NOT DETECTED
Staphylococcus aureus (BCID): NOT DETECTED
Staphylococcus species: DETECTED — AB
Streptococcus pneumoniae: NOT DETECTED
Streptococcus pyogenes: NOT DETECTED
Streptococcus species: NOT DETECTED

## 2016-12-26 LAB — ECHOCARDIOGRAM COMPLETE
AVLVOTPG: 4 mmHg
E decel time: 165 msec
EERAT: 16.09
FS: 9 % — AB (ref 28–44)
HEIGHTINCHES: 76 in
LA ID, A-P, ES: 54 mm
LA diam end sys: 54 mm
LA diam index: 1.97 cm/m2
LA vol A4C: 180 ml
LA vol: 174 mL
LAVOLIN: 63.5 mL/m2
LV E/e' medial: 16.09
LV TDI E'LATERAL: 6.96
LV e' LATERAL: 6.96 cm/s
LVEEAVG: 16.09
LVOT SV: 116 mL
LVOT VTI: 17.6 cm
LVOT area: 6.61 cm2
LVOT diameter: 29 mm
LVOTPV: 96.8 cm/s
MV Dec: 165
MV Peak grad: 5 mmHg
MV pk A vel: 37.3 m/s
MV pk E vel: 112 m/s
RV LATERAL S' VELOCITY: 11.5 cm/s
TAPSE: 20 mm
TDI e' medial: 6.09
WEIGHTICAEL: 4790.4 [oz_av]

## 2016-12-26 LAB — CBC
HCT: 34.3 % — ABNORMAL LOW (ref 39.0–52.0)
Hemoglobin: 11.3 g/dL — ABNORMAL LOW (ref 13.0–17.0)
MCH: 30.6 pg (ref 26.0–34.0)
MCHC: 32.9 g/dL (ref 30.0–36.0)
MCV: 93 fL (ref 78.0–100.0)
Platelets: 141 10*3/uL — ABNORMAL LOW (ref 150–400)
RBC: 3.69 MIL/uL — ABNORMAL LOW (ref 4.22–5.81)
RDW: 13.8 % (ref 11.5–15.5)
WBC: 5.1 10*3/uL (ref 4.0–10.5)

## 2016-12-26 LAB — HIV ANTIBODY (ROUTINE TESTING W REFLEX): HIV Screen 4th Generation wRfx: NONREACTIVE

## 2016-12-26 LAB — HEMOGLOBIN A1C
Hgb A1c MFr Bld: 5.2 % (ref 4.8–5.6)
MEAN PLASMA GLUCOSE: 103 mg/dL

## 2016-12-26 LAB — TROPONIN I: Troponin I: 0.04 ng/mL (ref <0.03)

## 2016-12-26 MED ORDER — POTASSIUM CHLORIDE 20 MEQ/15ML (10%) PO SOLN
40.0000 meq | Freq: Once | ORAL | Status: AC
  Administered 2016-12-26: 40 meq via ORAL
  Filled 2016-12-26: qty 30

## 2016-12-26 MED ORDER — POTASSIUM CHLORIDE 20 MEQ/15ML (10%) PO SOLN: 40.0000 meq | Freq: Every day | ORAL | Status: DC

## 2016-12-26 MED ORDER — FUROSEMIDE 10 MG/ML IJ SOLN
80.0000 mg | Freq: Two times a day (BID) | INTRAMUSCULAR | Status: DC
  Filled 2016-12-26: qty 8

## 2016-12-26 MED ORDER — FUROSEMIDE 10 MG/ML IJ SOLN
80.0000 mg | Freq: Once | INTRAMUSCULAR | Status: AC
Start: 2016-12-27 — End: 2016-12-27
  Administered 2016-12-27: 80 mg via INTRAVENOUS
  Filled 2016-12-26: qty 8

## 2016-12-26 MED ORDER — POTASSIUM CHLORIDE 20 MEQ/15ML (10%) PO SOLN
60.0000 meq | Freq: Once | ORAL | Status: AC
  Administered 2016-12-26: 60 meq via ORAL
  Filled 2016-12-26: qty 45

## 2016-12-26 MED ORDER — LOSARTAN POTASSIUM 25 MG PO TABS
25.0000 mg | ORAL_TABLET | Freq: Two times a day (BID) | ORAL | Status: DC
  Administered 2016-12-27: 25 mg via ORAL
  Filled 2016-12-26: qty 1

## 2016-12-26 MED ORDER — FUROSEMIDE 10 MG/ML IJ SOLN
80.0000 mg | Freq: Two times a day (BID) | INTRAMUSCULAR | Status: DC
  Administered 2016-12-26: 80 mg via INTRAVENOUS

## 2016-12-26 MED ORDER — FUROSEMIDE 80 MG PO TABS: 80.0000 mg | ORAL_TABLET | Freq: Two times a day (BID) | ORAL | Status: DC

## 2016-12-26 MED ORDER — SPIRONOLACTONE 25 MG PO TABS
25.0000 mg | ORAL_TABLET | Freq: Every day | ORAL | Status: DC
  Administered 2016-12-27: 25 mg via ORAL
  Filled 2016-12-26: qty 1

## 2016-12-26 NOTE — Consult Note (Signed)
Advanced Heart Failure Team Consult Note  Primary Cardiologist:  Dr. Antoine Poche  Reason for Consultation: Acute on chronic systolic CHF  HPI:    [name] [name] is seen today for evaluation of acute on chronic systolic CHF at the request of Dr. Cyndie Chime.  [name] [name] is a 56 y.o. male with history of chronic systolic CHF due to NICM, echo 07/2015 EF 20-25% s/p Medtronic BiV-ICD placement, PAF on Xarelto with questionable compliance, ETOH abuse, tobacco abuse, and OSA with CPAP use.   Last seen in office by Dr. Antoine Poche 10/2016. Thought to be stable overall but did note some medical non-compliance.   Pt presented to [facility] 12/25/16 with worsening dyspnea over past several weeks. Also c/o orthopnea and PND. No CP or palpitations.  CXR 12/25/16 with pulmonary edema (reviewed personally).  Pertinent labs on admission include BNP 1194, Creatinine 0.90, K 4.1. Troponin 0.7 -> 0.4. WBC WNL.   Diuresed on IV lasix 80 mg BID. Out 1.2 L and weight shows down 8 lbs.   Pt states he had worsening DOE over past 2-3 weeks. Usually has no dyspnea on flat ground, but only with hills and getting in a hurry. Now SOB with minimal exertion including bathing and changing clothes. SOB walking from bed to bathroom. Had been orthopneic and + PND. Now resolved.  Doesn't weight daily.  States he tries to watch salt, but then states how much he loves saltines and peanut butter. Smokes 2-3 joints daily and drinks 6-8 beers. "Occasional" tobacco use, but not on a daily basis. Was taking lasix 80 mg daily prior to decompensation. Increased to 80 mg BID with minimal relief. No fever, chills, CP, or lightheadedness/dizziness.   CXR this am with significant clearing of edema compared to yesterdays. Personally reviewed.   L/RHC 03/2015  Non-obstructive CAD RHC Hemodynamics RA mean 13 mm hg RV 49/7 (15) mm Hg PA 47/26 (35) mm Hg PCWP 28 mm Hg AO 90/69 mm Hg Cardiac Output (Fick) 5.52 Cardiac Index (Fick) 2.15   Review of  Systems: [y] = yes, [ ]  = no   General: Weight gain [ ] ; Weight loss [ ] ; Anorexia [ ] ; Fatigue [y]; Fever [ ] ; Chills [ ] ; Weakness [ ]   Cardiac: Chest pain/pressure [ ] ; Resting SOB [ ] ; Exertional SOB [y]; Orthopnea [y]; Pedal Edema [y]; Palpitations [ ] ; Syncope [ ] ; Presyncope [ ] ; Paroxysmal nocturnal dyspnea[y]  Pulmonary: Cough [y]; Wheezing[ ] ; Hemoptysis[ ] ; Sputum [ ] ; Snoring [ ]   GI: Vomiting[ ] ; Dysphagia[ ] ; Melena[ ] ; Hematochezia [ ] ; Heartburn[ ] ; Abdominal pain [ ] ; Constipation [ ] ; Diarrhea [ ] ; BRBPR [ ]   GU: Hematuria[ ] ; Dysuria [ ] ; Nocturia[ ]   Vascular: Pain in legs with walking [ ] ; Pain in feet with lying flat [ ] ; Non-healing sores [ ] ; Stroke [ ] ; TIA [ ] ; Slurred speech [ ] ;  Neuro: Headaches[ ] ; Vertigo[ ] ; Seizures[ ] ; Paresthesias[ ] ;Blurred vision [ ] ; Diplopia [ ] ; Vision changes [ ]   Ortho/Skin: Arthritis [y]; Joint pain [y]; Muscle pain [ ] ; Joint swelling [ ] ; Back Pain [ ] ; Rash [ ]   Psych: Depression[ ] ; Anxiety[ ]   Heme: Bleeding problems [ ] ; Clotting disorders [ ] ; Anemia [ ]   Endocrine: Diabetes [ ] ; Thyroid dysfunction[ ]   Home Medications Prior to Admission medications   Medication Sig Start Date End Date Taking? Authorizing Provider  albuterol (PROVENTIL HFA;VENTOLIN HFA) 108 (90 Base) MCG/ACT inhaler Inhale 1 puff into the lungs every 6 (six) hours as  needed for wheezing or shortness of breath.   Yes Historical Provider, MD  amiodarone (PACERONE) 200 MG tablet Take 200 mg by mouth daily.   Yes Historical Provider, MD  aspirin 81 MG chewable tablet Chew 81 mg by mouth daily.   Yes Historical Provider, MD  atorvastatin (LIPITOR) 80 MG tablet Take 80 mg by mouth daily.   Yes Historical Provider, MD  Echinacea-Goldenseal (IMMUNE HEALTH BLEND PO) Take 1 capsule by mouth daily. With Rose Hips   Yes Historical Provider, MD  FLUoxetine (PROZAC) 20 MG capsule Take 20 mg by mouth daily.   Yes Historical Provider, MD  furosemide (LASIX) 80 MG tablet Take  80 mg by mouth 2 (two) times daily.   Yes Historical Provider, MD  guaiFENesin (MUCINEX) 600 MG 12 hr tablet Take 600 mg by mouth 2 (two) times daily as needed for cough.   Yes Historical Provider, MD  lisinopril (PRINIVIL,ZESTRIL) 2.5 MG tablet Take 2.5 mg by mouth daily.   Yes Historical Provider, MD  pantoprazole (PROTONIX) 40 MG tablet Take 40 mg by mouth daily.   Yes Historical Provider, MD  potassium chloride (K-DUR,KLOR-CON) 10 MEQ tablet Take 20 mEq by mouth daily.   Yes Historical Provider, MD  potassium gluconate 595 (99 K) MG TABS tablet Take 595 mg by mouth daily.   Yes Historical Provider, MD  rivaroxaban (XARELTO) 20 MG TABS tablet Take 20 mg by mouth daily with supper.   Yes Historical Provider, MD    Past Medical History: Past Medical History:  Diagnosis Date  . COPD (chronic obstructive pulmonary disease) (HCC)     Past Surgical History: Past Surgical History:  Procedure Laterality Date  . 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 Other Mother     cardiac surgery. late 1990s  . Congestive Heart Failure Father     Social History: Social History   Social History  . Marital status: Single    Spouse name: N/A  . Number of children: 2  . Years of education: N/A   Occupational History  . disabled, odd jobs    Social History Main Topics  . Smoking status: Current Some Day Smoker    Types: Cigarettes  . Smokeless tobacco: Never Used     Comment: about a pack a week. social  . Alcohol use 25.2 oz/week    42 Cans of beer per week     Comment: 6 beers per day  . Drug use: Yes    Frequency: 7.0 times per week    Types: Marijuana  . Sexual activity: Yes    Partners: Female   Other Topics Concern  . None   Social History Narrative  . None    Allergies:  No Known Allergies  Objective:    Vital Signs:   Temp:  [98.3 F (36.8 C)-98.4 F (36.9 C)] 98.4 F (36.9  C) (04/10 0439) Pulse Rate:  [66-78] 66 (04/10 0439) Resp:  [16-25] 18 (04/10 0439) BP: (106-133)/(59-72) 113/72 (04/10 0439) SpO2:  [95 %-100 %] 98 % (04/10 0805) Weight:  [299 lb 6.4 oz (135.8 kg)-307 lb 12.8 oz (139.6 kg)] 299 lb 6.4 oz (135.8 kg) (04/10 0439) Last BM Date: 12/25/16  Weight change: Filed Weights   12/25/16 1604 12/26/16 0439  Weight: (!) 307 lb 12.8 oz (139.6 kg) 299 lb 6.4 oz (135.8 kg)    Intake/Output:   Intake/Output Summary (Last  24 hours) at 12/26/16 1118 Last data filed at 12/25/16 1800  Gross per 24 hour  Intake              360 ml  Output                0 ml  Net              360 ml     Physical Exam: General:  Well appearing caucasian male in NAD.  HEENT: Normal.  Neck: Supple. JVP 7-8 cm. Carotids 2+ bilat; no bruits. No thyromegaly or nodule noted.   Cor: PMI non-displaced. Regular rate & rhythm. No M/G/R appreciated.  Lungs: Mildly diminished basilar sounds Abdomen: Obese, soft, NT, ND, no HSM. No bruits or masses. +BS  Extremities: no cyanosis, clubbing, rash, edema Neuro: alert & orientedx3, cranial nerves grossly intact. moves all 4 extremities w/o difficulty. Affect pleasant  Telemetry: Personally reviewed, BiV pacing  Labs: Basic Metabolic Panel:  Recent Labs Lab 12/25/16 0600 12/25/16 0606 12/26/16 0225  NA 140 141 139  K 4.0 4.1 3.1*  CL 103 104 101  CO2 24  --  30  GLUCOSE 224* 224* 124*  BUN 12 13 14   CREATININE 0.96 0.90 0.84  CALCIUM 9.3  --  9.0    Liver Function Tests:  Recent Labs Lab 12/25/16 0600  AST 34  ALT 40  ALKPHOS 105  BILITOT 1.1  PROT 7.1  ALBUMIN 4.2   No results for input(s): LIPASE, AMYLASE in the last 168 hours. No results for input(s): AMMONIA in the last 168 hours.  CBC:  Recent Labs Lab 12/25/16 0600 12/25/16 0606 12/26/16 0225  WBC 11.8*  --  5.1  NEUTROABS 6.1  --   --   HGB 13.8 13.9 11.3*  HCT 41.5 41.0 34.3*  MCV 94.3  --  93.0  PLT 259  --  141*    Cardiac  Enzymes:  Recent Labs Lab 12/25/16 1414 12/25/16 1843 12/26/16 0526  TROPONINI 0.07* 0.07* 0.04*    BNP: BNP (last 3 results)  Recent Labs  12/25/16 0600  BNP 1,193.9*    ProBNP (last 3 results) No results for input(s): PROBNP in the last 8760 hours.   CBG: No results for input(s): GLUCAP in the last 168 hours.  Coagulation Studies: No results for input(s): LABPROT, INR in the last 72 hours.  Other results: EKG 12/25/16 with Sinus tach at 108 bpm EKG 12/26/16 with AV pacing at 62 bpm  Imaging: X-ray Chest Pa And Lateral  Result Date: 12/26/2016 CLINICAL DATA:  Pulmonary edema, CHF EXAM: CHEST  2 VIEW COMPARISON:  12/25/2016 FINDINGS: Left AICD remains in place, unchanged. Cardiomegaly. Improving bilateral airspace opacities, likely improving edema. Mild residual vascular congestion and interstitial opacities. No effusions or acute bony abnormality. IMPRESSION: Significant improvement in pulmonary edema/ CHF. Electronically Signed   By: Charlett Nose M.D.   On: 12/26/2016 07:24      Medications:     Current Medications: . amiodarone  200 mg Oral Daily  . aspirin EC  81 mg Oral Daily  . atorvastatin  80 mg Oral q1800  . folic acid  1 mg Oral Daily  . furosemide  80 mg Intravenous BID  . lisinopril  10 mg Oral Daily  . mouth rinse  15 mL Mouth Rinse BID  . multivitamin with minerals  1 tablet Oral Daily  . pantoprazole  40 mg Oral Daily  . PARoxetine  20 mg Oral Daily  . [START  ON 12/27/2016] potassium chloride  40 mEq Oral Daily  . rivaroxaban  20 mg Oral Q supper  . sodium chloride flush  3 mL Intravenous Q12H  . spironolactone  12.5 mg Oral Daily  . thiamine  100 mg Oral Daily   Or  . thiamine  100 mg Intravenous Daily     Infusions:   Assessment/Plan   [name] [name] is a 56 y.o. male with history of chronic systolic CHF due to NICM, echo 07/2015 EF 20-25% s/p Medtronic BiV-ICD placement, PAF on Xarelto with questionable compliance, ETOH abuse, tobacco  abuse, and OSA with CPAP use. AHF team asked to see for medical optimization.   1. Acute on chronic systolic CHF due to NICM - Echo 07/2015 with LVEF 20-25%. Repeat echo pending.   - Last cath in 2016 with non-obstructive CAD and marginal CO. If EF worse or symptoms recur, may need repeat RHC.  - Volume status looks mostly improved. Unclear what brought on this decompensation.  - Would give one additional dose IV lasix this evening and back on lasix po for home in am.  - Would use lasix 80 mg BID for now ( was on lasix 80 mg daily prior to decompensation). - May eventually need to switch to torsemide - Would switch lisinopril to losartan in anticipation of switching to [facility]. Received lisinopril 10 mg this am.  Will start losartan 25 mg BID in am.   If EF remains down will consider switch to [facility] as outpatient. (need 36 hr wash out period.  - CXR showed rapid clearing of lung spaces. Reviewed personally.  - Increase spiro to 25 mg daily.  - Reinforced fluid restriction to < 2 L daily, sodium restriction to less than 2000 mg daily, and the importance of daily weights.  2. PAF on Xarelto - Needs to remain compliant with his Xarelto. - Will have [employer] rep interrogate device to look at % pacing and afib burden.  - CHA2DS2/VASc at least 3.  3. OSA with CAP use - Continue nightly CPAP.  4. ETOH abuse - 6-8 beers daily. Encouraged to stop. 5. Tobacco abuse - Minimal. Encouraged to stop completely 6. Substance abuse  - Daily THC use. Needs to stop completely. Pt not interested in cessation at this time.  7. Hypokalemia - Increase spiro as above. Received supp.  Volume status appears near baseline. Give IV lasix tonight and transition to po in am. Would repeat echo and adjust meds as above. Possibly home tomorrow. We will follow in the HF clinic as outpatient.   Length of Stay: 1  Luane School  12/26/2016, 11:18 AM  Advanced Heart Failure Team Pager [phone] (M-F;  7a - 4p)  Please contact [facility] for night-coverage after hours (4p -7a ) and weekends on [email or web address]  Patient seen with PA, agree with the above note.  Acute on chronic systolic CHF, ischemic cardiomyopathy.  Echo today with EF 15%, RV function ok.  He was admitted with increased dyspnea and PND.  On questioning, he is a heavy drinker, up to 20 beers/day on race days.  He has diuresed well so far with weight down.  - Continue Lasix 80 mg IV bid x 2 more doses, then likely transition to torsemide.  - Transition to losartan with plan to get him on Entresto eventually.  - Increase spironolactone to 25 mg daily.  - Add Coreg at low dose when volume is better controlled.  - Will have Medtr[employer] rep assess his device, make  sure it is BiV pacing appropriately (appears to be by ECG).  - Discussed cutting back significantly on ETOH.   Marca Ancona 12/26/2016 5:15 PM

## 2016-12-26 NOTE — Progress Notes (Signed)
Came to assess patient readiness for NIV for the night. On arrival pt was already on this NIV machine. tolerating it well no distress or complications noted

## 2016-12-26 NOTE — Progress Notes (Signed)
Subjective: No acute events overnight. Patients reports his breathing is much improved. Denies chest pain. Denies fever. Denies cough. Reports he is appropriately voiding on IV Lasix.  Objective: Vital signs in last 24 hours: Vitals:   12/25/16 1952 12/25/16 2125 12/26/16 0439 12/26/16 0805  BP: (!) 107/59  113/72   Pulse: 72 78 66   Resp: 18 16 18    Temp: 98.4 F (36.9 C)  98.4 F (36.9 C)   TempSrc: Oral  Oral   SpO2: 99% 99% 100% 98%  Weight:   135.8 kg (299 lb 6.4 oz)   Height:       Weight change:   Intake/Output Summary (Last 24 hours) at 12/26/16 1119 Last data filed at 12/25/16 1800  Gross per 24 hour  Intake              360 ml  Output                0 ml  Net              360 ml   BP 113/72 (BP Location: Left Arm)   Pulse 66   Temp 98.4 F (36.9 C) (Oral)   Resp 18   Ht 6\' 4"  (1.93 m)   Wt 135.8 kg (299 lb 6.4 oz)   SpO2 98%   BMI 36.44 kg/m   General Appearance:    Alert, cooperative, no distress, appears stated age  Head:    Normocephalic, without obvious abnormality, atraumatic  Eyes:    PERRL, conjunctiva/corneas clear, EOM's intact, fundi    benign, both eyes       Ears:    Normal TM's and external ear canals, both ears  Nose:   Nares normal, septum midline, mucosa normal, no drainage    or sinus tenderness  Throat:   Lips, mucosa, and tongue normal; teeth and gums normal  Neck:   Supple, symmetrical, trachea midline, no adenopathy;       thyroid:  No enlargement/tenderness/nodules; no carotid   bruit or JVD  Back:     Symmetric, no curvature, ROM normal, no CVA tenderness  Lungs:     Clear to auscultation bilaterally, improved since yesterday respirations unlabored  Chest wall:    No tenderness or deformity  Heart:    Regular rate and rhythm, S1 and S2 normal, no murmur, rub   or gallop  Abdomen:     Soft, non-tender, non-distended, bowel sounds active all four quadrants,    no masses, no organomegaly. Ventral hernia        Extremities:    Extremities normal, atraumatic, no cyanosis or edema  Pulses:   2+ and symmetric all extremities  Skin:   Skin color, texture, turgor normal, no rashes or lesions  Lymph nodes:   Cervical, supraclavicular, and axillary nodes normal  Neurologic:   Normal strength, sensation and reflexes      throughout   Lab Results: @LABTEST2 @ Micro Results: Recent Results (from the past 240 hour(s))  Blood culture (routine x 2)     Status: None (Preliminary result)   Collection Time: 12/25/16  5:58 AM  Result Value Ref Range Status   Specimen Description BLOOD RIGHT ARM  Final   Special Requests   Final    BOTTLES DRAWN AEROBIC AND ANAEROBIC Blood Culture adequate volume   Culture  Setup Time   Final    GRAM POSITIVE COCCI ANAEROBIC BOTTLE ONLY CRITICAL RESULT CALLED TO, READ BACK BY AND VERIFIED WITH: PHARMD NATHAN BATCHELDER  [id number] MLM    Culture GRAM POSITIVE COCCI  Final   Report Status PENDING  Incomplete  Blood Culture ID Panel (Reflexed)     Status: Abnormal   Collection Time: 12/25/16  5:58 AM  Result Value Ref Range Status   Enterococcus species NOT DETECTED NOT DETECTED Final   Listeria monocytogenes NOT DETECTED NOT DETECTED Final   Staphylococcus species DETECTED (A) NOT DETECTED Final    Comment: Methicillin (oxacillin) susceptible coagulase negative staphylococcus. Possible blood culture contaminant (unless isolated from more than one blood culture draw or clinical case suggests pathogenicity). No antibiotic treatment is indicated for blood  culture contaminants. CRITICAL RESULT CALLED TO, READ BACK BY AND VERIFIED WITH: PHARMD N BATCHELDER [id number] MLM    Staphylococcus aureus NOT DETECTED NOT DETECTED Final   Methicillin resistance NOT DETECTED NOT DETECTED Final   Streptococcus species NOT DETECTED NOT DETECTED Final   Streptococcus agalactiae NOT DETECTED NOT DETECTED Final   Streptococcus pneumoniae NOT DETECTED NOT DETECTED Final   Streptococcus pyogenes NOT  DETECTED NOT DETECTED Final   Acinetobacter baumannii NOT DETECTED NOT DETECTED Final   Enterobacteriaceae species NOT DETECTED NOT DETECTED Final   Enterobacter cloacae complex NOT DETECTED NOT DETECTED Final   Escherichia coli NOT DETECTED NOT DETECTED Final   Klebsiella oxytoca NOT DETECTED NOT DETECTED Final   Klebsiella pneumoniae NOT DETECTED NOT DETECTED Final   Proteus species NOT DETECTED NOT DETECTED Final   Serratia marcescens NOT DETECTED NOT DETECTED Final   Haemophilus influenzae NOT DETECTED NOT DETECTED Final   Neisseria meningitidis NOT DETECTED NOT DETECTED Final   Pseudomonas aeruginosa NOT DETECTED NOT DETECTED Final   Candida albicans NOT DETECTED NOT DETECTED Final   Candida glabrata NOT DETECTED NOT DETECTED Final   Candida krusei NOT DETECTED NOT DETECTED Final   Candida parapsilosis NOT DETECTED NOT DETECTED Final   Candida tropicalis NOT DETECTED NOT DETECTED Final  Blood culture (routine x 2)     Status: None (Preliminary result)   Collection Time: 12/25/16  6:05 AM  Result Value Ref Range Status   Specimen Description BLOOD LEFT HAND  Final   Special Requests IN PEDIATRIC BOTTLE Blood Culture adequate volume  Final   Culture NO GROWTH 1 DAY  Final   Report Status PENDING  Incomplete  Respiratory Panel by PCR     Status: None   Collection Time: 12/25/16 11:38 AM  Result Value Ref Range Status   Adenovirus NOT DETECTED NOT DETECTED Final   Coronavirus 229E NOT DETECTED NOT DETECTED Final   Coronavirus HKU1 NOT DETECTED NOT DETECTED Final   Coronavirus NL63 NOT DETECTED NOT DETECTED Final   Coronavirus OC43 NOT DETECTED NOT DETECTED Final   Metapneumovirus NOT DETECTED NOT DETECTED Final   Rhinovirus / Enterovirus NOT DETECTED NOT DETECTED Final   Influenza A NOT DETECTED NOT DETECTED Final   Influenza B NOT DETECTED NOT DETECTED Final   Parainfluenza Virus 1 NOT DETECTED NOT DETECTED Final   Parainfluenza Virus 2 NOT DETECTED NOT DETECTED Final    Parainfluenza Virus 3 NOT DETECTED NOT DETECTED Final   Parainfluenza Virus 4 NOT DETECTED NOT DETECTED Final   Respiratory Syncytial Virus NOT DETECTED NOT DETECTED Final   Bordetella pertussis NOT DETECTED NOT DETECTED Final   Chlamydophila pneumoniae NOT DETECTED NOT DETECTED Final   Mycoplasma pneumoniae NOT DETECTED NOT DETECTED Final   Studies/Results: X-ray Chest Pa And Lateral  Result Date: 12/26/2016 CLINICAL DATA:  Pulmonary edema, CHF EXAM: CHEST  2 VIEW COMPARISON:  12/25/2016 FINDINGS: Left AICD remains in place, unchanged. Cardiomegaly. Improving bilateral airspace opacities, likely improving edema. Mild residual vascular congestion and interstitial opacities. No effusions or acute bony abnormality. IMPRESSION: Significant improvement in pulmonary edema/ CHF. Electronically Signed   By: Charlett Nose M.D.   On: 12/26/2016 07:24   Dg Chest Port 1 View  Result Date: 12/25/2016 CLINICAL DATA:  56 y/o  M; respiratory distress. EXAM: PORTABLE CHEST 1 VIEW COMPARISON:  None. FINDINGS: Enlarged cardiac silhouette. Diffuse patchy opacification of the lungs probably representing moderate pulmonary edema. Pneumonia is not excluded. No large pleural effusion. No pneumothorax. Pacemaker noted. Bones are unremarkable. IMPRESSION: Enlarged cardiac silhouette. Diffuse patchy opacification of lungs probably represents moderate pulmonary edema. Underlying pneumonia is not excluded. Electronically Signed   By: Mitzi Hansen M.D.   On: 12/25/2016 06:05   Medications: I have reviewed the patient's current medications. Scheduled Meds: . amiodarone  200 mg Oral Daily  . aspirin EC  81 mg Oral Daily  . atorvastatin  80 mg Oral q1800  . folic acid  1 mg Oral Daily  . furosemide  80 mg Intravenous BID  . lisinopril  10 mg Oral Daily  . mouth rinse  15 mL Mouth Rinse BID  . multivitamin with minerals  1 tablet Oral Daily  . pantoprazole  40 mg Oral Daily  . PARoxetine  20 mg Oral Daily  .  [START ON 12/27/2016] potassium chloride  40 mEq Oral Daily  . rivaroxaban  20 mg Oral Q supper  . sodium chloride flush  3 mL Intravenous Q12H  . spironolactone  12.5 mg Oral Daily  . thiamine  100 mg Oral Daily   Or  . thiamine  100 mg Intravenous Daily   Continuous Infusions: PRN Meds:.acetaminophen **OR** acetaminophen, LORazepam **OR** LORazepam, senna-docusate Assessment/Plan: Principal Problem:   Acute respiratory failure with hypoxia and hypercarbia (HCC) Active Problems:   Paroxysmal atrial fibrillation (HCC)   HTN (hypertension)   Obesity   Tobacco abuse   Alcohol abuse   OSA (obstructive sleep apnea)   CAD (coronary artery disease)   Dilated cardiomyopathy (HCC)   ICD (implantable cardioverter-defibrillator) in place   Hyperglycemia   Pulmonary edema  Mr. Guerrant is a 56 yo M with PMHx of Chronic Systolic HF (EF 20-25% in 2016), Dilated NICM s/p ICD, PAF non-compliant with Xarelto, Alcohol Abuse, Tobacco Abuse, OSA on CPAP and HTN who presented 12/25/2016 with shortness of breath, most likely .  Acute Respiratory Failure with Hypercarbia:  Patient presented with worsening shortness of breath for the last 1 month and dyspnea on exertion with history of poor dilated NICM. He developed respiratory distress morning of admission requiring BiPAP, Lasix, nitroglycerin. Clinically, patient appears to have developed pulmonary edema, but does not appear grossly volume overloaded. This is likely secondary to medication noncompliance, CHF exacerbation or hypertensive urgency. ABG pH 7.290 / PCO2 53.8 / PO2 72 at admission, improved to pH 7.450 / PCO2 40.8 / PO2 70 on BiPAP. BNP at admsision 1193. TSH normal 1.150. Troponin trended downwards from 0.07 to 0.04 since admission. RVP and influenza negative. BCx NGTD. CXR 4/10 significantly improved pulmonary edema compared to at admission. Patient states his normal baseline weight is 300. BiPAP and NTG gtt weaned off. - Continue spironolactone  12.5 mg daily, lisinopril 10 mg daily, aspirin 81 mg daily - Continue Lasix 80 mg IV bid - Hold carvedilol given noncompliance at home and acute decompensation - Strict I's and O's - Daily weights -- Echo still  pending - Trend daily BMP   Dilated NICM s/p ICD: ICD placed in 2016. Last echo showed EF 20-25% in 2016. Patient reportedly sees Dr. Antoine Poche outpatient for cardiology. Likely that patient's acute respiratory decompensation and pulmonary edema is due to worsening CHF as well as non-compliance with home medications. - Continue spironolactone 12.5 mg daily, lisinopril 10 mg daily, aspirin 81 mg daily - Continue Lasix 80 mg IV bid - Hold carvedilol given noncompliance at home and acute decompensation - Strict I's and O's -- Echo still pending -- Cardiology c/s, appreciate recs - Will stop trend troponin -- Tele  Paroxysmal atrial fibrillation: Reports compliance with amiodarone 200 mg daily. Reports taking his Xarelto 20 mg every other day as it makes him feel poorly. - Continue amiodarone 200 mg daily - Holding home carvedilol given noncompliance and acute heart failure decompensation - Continue Xarelto 20 mg daily  Hyperlipidemia:  -Continue atorvastatin 80 mg daily  Hypertension: BP's have improved to on lisinopril, spironolactone, and Lasix. Will continue to hold carvedilol to prevent hypotension. - Continue lisinopril and spironolactone - Hold carvedilol  Depression: -Continue paroxetine 20 mg daily  OSA:  -Currently on BiPAP -CPAP daily at bedtime  Hyperglycemia: Admitting glucose 224. Today it is 124. A1C yesterday was  5.2 - Nothing by mouth while on BiPAP  Alcohol abuse: Patient reports 6 beers per day. - CIWA  Tobacco abuse: Patient reports smoking 3 cigarettes per day for the past 10 years. - Provided counseling on smoking cessation  FEN: Heart healthy diet; replete electrolytes prn Code: Full DVT/PE ppx: Xarelto  Dispo: Stepdown Unit; will  move to floor tomorrow 4/11 if improvement continues; anticipated discharge date 12/28/2016  This is a Psychologist, occupational Note.  The care of the patient was discussed with Dr. Cyndie Chime and the assessment and plan formulated with their assistance.  Please see their attached note for official documentation of the daily encounter.   LOS: 1 day   Fredricka Bonine, Medical Student 12/26/2016, 11:19 AM

## 2016-12-26 NOTE — Progress Notes (Signed)
  PHARMACY - PHYSICIAN COMMUNICATION CRITICAL VALUE ALERT - BLOOD CULTURE IDENTIFICATION (BCID)  Results for orders placed or performed during the hospital encounter of 12/25/16  Blood Culture ID Panel (Reflexed) (Collected: 12/25/2016  5:58 AM)  Result Value Ref Range   Enterococcus species NOT DETECTED NOT DETECTED   Listeria monocytogenes NOT DETECTED NOT DETECTED   Staphylococcus species DETECTED (A) NOT DETECTED   Staphylococcus aureus NOT DETECTED NOT DETECTED   Methicillin resistance NOT DETECTED NOT DETECTED   Streptococcus species NOT DETECTED NOT DETECTED   Streptococcus agalactiae NOT DETECTED NOT DETECTED   Streptococcus pneumoniae NOT DETECTED NOT DETECTED   Streptococcus pyogenes NOT DETECTED NOT DETECTED   Acinetobacter baumannii NOT DETECTED NOT DETECTED   Enterobacteriaceae species NOT DETECTED NOT DETECTED   Enterobacter cloacae complex NOT DETECTED NOT DETECTED   Escherichia coli NOT DETECTED NOT DETECTED   Klebsiella oxytoca NOT DETECTED NOT DETECTED   Klebsiella pneumoniae NOT DETECTED NOT DETECTED   Proteus species NOT DETECTED NOT DETECTED   Serratia marcescens NOT DETECTED NOT DETECTED   Haemophilus influenzae NOT DETECTED NOT DETECTED   Neisseria meningitidis NOT DETECTED NOT DETECTED   Pseudomonas aeruginosa NOT DETECTED NOT DETECTED   Candida albicans NOT DETECTED NOT DETECTED   Candida glabrata NOT DETECTED NOT DETECTED   Candida krusei NOT DETECTED NOT DETECTED   Candida parapsilosis NOT DETECTED NOT DETECTED   Candida tropicalis NOT DETECTED NOT DETECTED     Name of physician (or Provider) Contacted: Terrilee Croak  1/2 Blood cx shows GPC in clusters. BCID reports as staph species. Most likely contaminant. No clinical sign of infection other than possible viral URI. Resp panel negative. Currently not on abx.  Changes to prescribed antibiotics required: No changes needed  Enzo Bi, PharmD, [facility] Clinical Pharmacist Pager [phone] 12/26/2016 9:53  AM

## 2016-12-26 NOTE — Progress Notes (Signed)
  Echocardiogram 2D Echocardiogram has been performed.  Delcie Roch 12/26/2016, 3:51 PM

## 2016-12-26 NOTE — Progress Notes (Signed)
Subjective:  Patient is feeling better since admission. His breathing is improved and he is able to lay flat without dyspnea. He denies any chest pain. He says he did not sleep well, waking up every few hours and sweating. He did wear a CPAP at night for OSA. He is not requiring supplemental O2.   Objective:  Vital signs in last 24 hours: Vitals:   12/25/16 1952 12/25/16 2125 12/26/16 0439 12/26/16 0805  BP: (!) 107/59  113/72   Pulse: 72 78 66   Resp: 18 16 18    Temp: 98.4 F (36.9 C)  98.4 F (36.9 C)   TempSrc: Oral  Oral   SpO2: 99% 99% 100% 98%  Weight:   299 lb 6.4 oz (135.8 kg)   Height:       General: resting in bed, no acute distress Cardiac: RRR, no rubs, murmurs or gallops Pulm: distant breath sounds, otherwise clear to auscultation bilaterally, moving normal volumes of air Abd: nondistended Ext: warm and well perfused, no pedal edema Skin: diaphoretic Neuro: alert and oriented X3   Assessment/Plan:  Principal Problem:   Acute respiratory failure with hypoxia and hypercarbia (HCC) Active Problems:   Paroxysmal atrial fibrillation (HCC)   HTN (hypertension)   Obesity   Tobacco abuse   Alcohol abuse   OSA (obstructive sleep apnea)   CAD (coronary artery disease)   Dilated cardiomyopathy (HCC)   ICD (implantable cardioverter-defibrillator) in place   Hyperglycemia   Pulmonary edema  [name] is a 56 yo M with PMHx of Chronic Systolic HF (EF 20-25% in 2016), Dilated NICM s/p ICD, PAF non-compliant with Xarelto, Alcohol Abuse, Tobacco Abuse, OSA on CPAP and HTN Who presents to the ED via EMS for shortness of breath.  HFrEF: Last TTE showed EF 20-25% in 2016. Patient's respiratory distress 2/2 pulmonary edema has resolved. I have reviewed the repeat CXR this morning which has shown marked improvement in his pulmonary edema. Clinically, patient appears euvolemic now. He responded well to IV Lasix yesterday. His recorded weight this morning is 299 lbs compared to  307 lbs on admission. He is net down 1.2 L per documented I/Os. Prior workup for the etiology of his HFrEF suggests a NICM cardiomyopathy, likely contributed by uncontrolled HTN and alcohol use. Given his significantly reduced ejection fraction, we have consulted the Heart Failure service for further assistance in optimizing his medical management. - Appreciate Heart Failure team's recomendations - Home Spironolactone increased to 25 mg daily - d/c Lisinopril 10 mg daily - Start Losartan 25 mg daily - Held Carvedilol given noncompliance at home and acute decompensation, likely resume tomorrow - Lasix 80 mg IV once this evening then switch to po 80 mg BID tomorrow - Continue Strict I/O's & Daily weights - F/u TTE - follow BMET - TSH WNL  Possible URI: Patient reported a 5 day history of nasal congestion, sore throat, mildly productive cough of gray sputum and occasional wheezing. Leukocytosis has resolved. Repeat CXR without sign of consolidation. Respiratory viral panel is negative. 1 of 2 blood cultures is + for Methicillin susceptible coagulase negative staph, likely contaminant. - Continue to monitor  Paroxysmal atrial fibrillation: Patient is in sinus rhythm. He reports compliance with amiodarone 200 mg daily. He reports taking his Xarelto 20 mg every other day as it makes him feel poorly. - Continue amiodarone 200 mg daily - Holding home carvedilol given noncompliance and acute heart failure decompensation - Continue Xarelto 20 mg daily  Lactic acidosis: Resolved  Hyperlipidemia:  -  Continue atorvastatin 80 mg daily  Hypertension: Patient reports compliance with lisinopril 5 mg twice a day and spironolactone 12.5 mg daily. He reports he has not been taking his carvedilol. - Continue losartan and spironolactone as above - Held carvedilol  Depression: Patient reports compliance with proximal 1820 mg daily. -Continue proximal 1820 mg daily  OSA: Patient reports compliance  with nightly CPAP. - CPAP daily at bedtime  Hyperglycemia: Admitting glucose 224. Previous hemoglobin A1c was 5.3 in 2016. - Hemoglobin A1c this visit is 5.2 - not diabetic  Alcohol abuse: Patient reports 6 beers per day. - CIWA - EtOH cessation counseling  Tobacco abuse: Patient reports smoking 3 cigarettes per day for the past 10 years. - Provided counseling on smoking cessation  Dispo: Anticipated discharge in approximately 1-2 day(s).   [name] [name], MD 12/26/2016, 2:18 PM

## 2016-12-27 ENCOUNTER — Encounter (HOSPITAL_COMMUNITY): Payer: Self-pay | Admitting: Cardiology

## 2016-12-27 ENCOUNTER — Encounter: Payer: Self-pay | Admitting: Cardiology

## 2016-12-27 DIAGNOSIS — I5041 Acute combined systolic (congestive) and diastolic (congestive) heart failure: Secondary | ICD-10-CM

## 2016-12-27 LAB — BASIC METABOLIC PANEL
ANION GAP: 11 (ref 5–15)
BUN: 19 mg/dL (ref 6–20)
CALCIUM: 9.4 mg/dL (ref 8.9–10.3)
CO2: 28 mmol/L (ref 22–32)
CREATININE: 0.81 mg/dL (ref 0.61–1.24)
Chloride: 103 mmol/L (ref 101–111)
GFR calc Af Amer: >60 mL/min (ref >60)
GLUCOSE: 124 mg/dL — AB (ref 65–99)
Potassium: 3.8 mmol/L (ref 3.5–5.1)
Sodium: 142 mmol/L (ref 135–145)

## 2016-12-27 MED ORDER — RIVAROXABAN 20 MG PO TABS: 20.0000 mg | ORAL_TABLET | Freq: Every day | ORAL | 0 refills | Status: DC

## 2016-12-27 MED ORDER — PANTOPRAZOLE SODIUM 40 MG PO TBEC: 40.0000 mg | DELAYED_RELEASE_TABLET | Freq: Every day | ORAL | 0 refills | Status: DC

## 2016-12-27 MED ORDER — LOSARTAN POTASSIUM 25 MG PO TABS: 25.0000 mg | ORAL_TABLET | Freq: Two times a day (BID) | ORAL | 0 refills | Status: DC

## 2016-12-27 MED ORDER — TORSEMIDE 20 MG PO TABS: 40.0000 mg | ORAL_TABLET | Freq: Two times a day (BID) | ORAL | Status: DC

## 2016-12-27 MED ORDER — POTASSIUM CHLORIDE 20 MEQ/15ML (10%) PO SOLN: 40.0000 meq | Freq: Every day | ORAL | 0 refills | Status: DC

## 2016-12-27 MED ORDER — ATORVASTATIN CALCIUM 80 MG PO TABS: 80.0000 mg | ORAL_TABLET | Freq: Every day | ORAL | 0 refills | Status: DC

## 2016-12-27 MED ORDER — POTASSIUM CHLORIDE 20 MEQ/15ML (10%) PO SOLN: 40.0000 meq | Freq: Every day | ORAL | Status: DC

## 2016-12-27 MED ORDER — TORSEMIDE 20 MG PO TABS: 40.0000 mg | ORAL_TABLET | Freq: Two times a day (BID) | ORAL | 0 refills | Status: DC

## 2016-12-27 MED ORDER — SPIRONOLACTONE 25 MG PO TABS: 25.0000 mg | ORAL_TABLET | Freq: Every day | ORAL | 0 refills | Status: DC

## 2016-12-27 MED ORDER — PAROXETINE HCL 20 MG PO TABS: 20.0000 mg | ORAL_TABLET | Freq: Every day | ORAL | 0 refills | Status: DC

## 2016-12-27 MED ORDER — AMIODARONE HCL 200 MG PO TABS: 200.0000 mg | ORAL_TABLET | Freq: Every day | ORAL | 0 refills | Status: DC

## 2016-12-27 NOTE — Progress Notes (Signed)
Subjective:  Patient says he is feeling the best he has in at least 2 years. He denies any dyspnea, chest pain, or palpitations. He says he slept great. He is planning on quitting alcohol use. He reports recent life stresses that he has not been dealing with well, but feels optimistic in making the necessary lifestyle changes (alcohol/tobacco cessation, fluid/salt restriction, medication adherence) to live more comfortably. He has a PCP (Dr. Jeannetta Nap) in [place] who he plans to follow up with. His TTE yesterday shows an EF of 15% with diffuse hypokinesis.  Objective:  Vital signs in last 24 hours: Vitals:   12/26/16 0439 12/26/16 0805 12/26/16 2128 12/27/16 0448  BP: 113/72  99/60 105/61  Pulse: 66  68 71  Resp: 18  18 18   Temp: 98.4 F (36.9 C)  97.8 F (36.6 C) 98.3 F (36.8 C)  TempSrc: Oral  Oral Oral  SpO2: 100% 98% 99% 100%  Weight: 299 lb 6.4 oz (135.8 kg)   294 lb 8 oz (133.6 kg)  Height:       General: sitting up in bed, no acute distress Cardiac: RRR, no rubs, murmurs or gallops Pulm: distant breath sounds, otherwise clear to auscultation bilaterally, moving normal volumes of air Abd: soft, nondistended, nontender Ext: warm and well perfused, no pedal edema Neuro: alert and oriented X3   Assessment/Plan:  Principal Problem:   Acute respiratory failure with hypoxia and hypercarbia (HCC) Active Problems:   Paroxysmal atrial fibrillation (HCC)   HTN (hypertension)   Obesity   Tobacco abuse   Alcohol abuse   OSA (obstructive sleep apnea)   CAD (coronary artery disease)   Dilated cardiomyopathy (HCC)   ICD (implantable cardioverter-defibrillator) in place   Hyperglycemia   Pulmonary edema  Mr. Garris is a 56 yo M with PMHx of Chronic Systolic HF (EF 20-25% in 2016), Dilated NICM s/p ICD, PAF non-compliant with Xarelto, Alcohol Abuse, Tobacco Abuse, OSA on CPAP and HTN who presented to the ED via EMS for shortness of breath.  Acute on Chronic HFrEF: Last TTE  showed EF 20-25% in 2016, repeat TTE on 12/26/16 shows reduced EF of 15% with severe global reduction in LV systolic function. Patient's respiratory distress 2/2 pulmonary edema has resolved. His recorded weight this morning is 294 lbs compared to 307 lbs on admission. Urine output is unmeasured over the last 24hrs. Prior workup for the etiology of his HFrEF suggests a NICM cardiomyopathy, likely contributed by uncontrolled HTN and significant alcohol use.  - Appreciate Heart Failure team's recomendations - Spironolactone 25 mg daily - ACE-I changed to Losartan 25 mg daily (in anticipation of initiation of Entresto) - Held Carvedilol given noncompliance at home and acute decompensation - Received Lasix 80 mg IV once this morning - Started on Torsemide 40 mg po BID beginning this evening - Continue Strict I/O's & Daily weights - follow BMET, K improved to 3.8 this morning. Will add supplemental K 40 mEq daily with diuresis - Counseled on fluid and salt restriction  Likely Viral URI (resolved): Patient reported a 5 day history of nasal congestion, sore throat, mildly productive cough of gray sputum and occasional wheezing. Leukocytosis has resolved. Repeat CXR without sign of consolidation. Respiratory viral panel is negative. 1 of 2 blood cultures is + for Methicillin susceptible coagulase negative staph, likely contaminant. - Continue to monitor  Paroxysmal atrial fibrillation: Patient is in a paced rhythm with rate controlled. Pacer interrogated 4/10 showing BiV paced 96.8%. - Continue amiodarone 200 mg daily -  Holding home carvedilol given noncompliance and acute heart failure decompensation - Continue Xarelto 20 mg daily  Lactic acidosis: Resolved  Hyperlipidemia:  - Continue atorvastatin 80 mg daily  Hypertension: Patient reports compliance with lisinopril 5 mg twice a day and spironolactone 12.5 mg daily. He reports he has not been taking his carvedilol. - Continue losartan and  spironolactone as above - Held carvedilol  Depression: -Continue Paroxetine 20 mg daily  OSA: Tolerating CPAP well, reports consistent use at home. - CPAP daily at bedtime  Hyperglycemia: Admitting glucose 224. Previous hemoglobin A1c was 5.3 in 2016. - Hemoglobin A1c this visit is 5.2 - not diabetic  Alcohol abuse: Significant alcohol use at home contributing to his cardiomyopathy.  - CIWA - Extensive EtOH cessation counseling provided  Tobacco abuse: Patient reports smoking 3 cigarettes per day for the past 10 years. - Provided counseling on smoking cessation  Dispo: Anticipated discharge in approximately 1 day(s).   [name], MD 12/27/2016, 8:56 AM

## 2016-12-27 NOTE — Care Management Note (Signed)
Case Management Note Donn Pierini RN, BSN Unit 2W-Case Manager [phone]  Patient Details  Name: [name] [name] MRN: [id number] Date of Birth: 1961/03/30  Subjective/Objective:    Pt admitted with acute on chronic HF                Action/Plan: PTA pt lived at home alone- independent- plan to return home- pt does have a PCP- Windle Guard who he plans to f/u with- pt is uninsured and will need assistance with medication for discharge- spoke with pt at bedside- [facility] program explained- Letter given to pt for [facility]- with cost per prescription given $3- pt agreeable to program- and understands cost- prescriptions have been sent to [facility] on [address]- which is on the list of participating pharmacies.   Expected Discharge Date:  12/27/16               Expected Discharge Plan:  Home/Self Care  In-House Referral:  NA  Discharge planning Services  CM Consult, MATCH Program, Medication Assistance  Post Acute Care Choice:  NA Choice offered to:  NA  DME Arranged:    DME Agency:     HH Arranged:    HH Agency:     Status of Service:  Completed, signed off  If discussed at [employer], dates discussed:    Additional Comments:  Darrold Span, RN 12/27/2016, 2:21 PM

## 2016-12-27 NOTE — Discharge Summary (Signed)
Physician Discharge Summary  Patient ID: [name] [name] MRN: [id number] DOB/AGE: 23-Feb-1961 56 y.o.  Admit date: 12/25/2016 Discharge date: 12/27/2016 Attending Physician: Levert Feinstein, MD  PCP: [name], MD Cardiologist: [name], MD  Admission Diagnoses: Pulmonary edema   Discharge Diagnoses: CHF exacerbation  Hospital Course:  [name] is a 56 yo M with PMHx of Chronic Systolic HF, Dilated NICM s/p ICD, PAF non-compliant with Xarelto, Alcohol Abuse, Tobacco Abuse, OSA on CPAP and HTN Who presents to the ED via EMS for shortness of breath.  ED course: Patient was found to be diaphoretic, tachypnic, hypertensive at 155/119 and tachycardic at 110. Patient was placed on BiPAP which improved oxygenation. Labs showed BNP > 1000 with negative troponin. CXR showed diffuse opacities bilaterally consistent with moderate pulmonary edema. EKG with sinus tachycardia and supraventricular bigeminy. In the ED, patient was given IV Lasix and placed on a nitroglycerin drip. Blood pressure improved to 108/68 and respiratory status improved. Patient was admitted 12/25/2016.  During admission, patient was appropriately diuresed towards his dry weight with IV Lasix 80 mg bid. Patient was continued on his home Xarelto, amiodarone, atorvastatin, losartan, and spironolactone. His home carvedilol was not started due to soft blood pressures. CXR 4/10 showed much  improved pulmonary edema. Echo 4/10 was performed which showed EF 15% with diffuse hypokinesis with mild mitral regurgitation and severe left atrial enlargement. His ICD function was checked and cleared. Cardiology was consulted who adjusted his outpatient regimen shown below. At discharge, patient's shortness of breath was improved and did not complain of any chest pain throughout admission. Patient was hemodynamically stable at time of discharge. Patient was discharged on 12/27/2016.  For outpatient providers: - For heart failure and blood  pressure management, switch from Lasix PO to torsemide PO 40 mg bid, increase spironolactone to 25 mg daily, switch lisinopril to losartan 25 mg bid. Continue home atorvastatin 80 mg daily, and aspirin 81 mg daily. Note that we discontinued carvedilol. - For afib management, continue amiodarone 200 mg daily and Xarelto 20 mg daily - For OSA, continue CPAP - Please continue alcohol and smoking cessation conversation - Patient will follow-up with cardiologist / heart failure specialists per their notes  Discharge Vitals: Blood pressure 99/72, pulse 69, temperature 98.3 F (36.8 C), temperature source Oral, resp. rate 18, height 6\' 4"  (1.93 m), weight 294 lb 8 oz (133.6 kg), SpO2 100 %.  Pertinent Labs, Studies, and Procedures:  1. CXR Port 1 View 12/25/2016 FINDINGS: Enlarged cardiac silhouette. Diffuse patchy opacification of the lungs probably representing moderate pulmonary edema. Pneumonia is not excluded. No large pleural effusion. No pneumothorax. Pacemaker noted. Bones are unremarkable.  IMPRESSION: Enlarged cardiac silhouette. Diffuse patchy opacification of lungs probably represents moderate pulmonary edema. Underlying pneumonia is not excluded.  2. CXR 2 View 12/26/16 FINDINGS: Left AICD remains in place, unchanged. Cardiomegaly. Improving bilateral airspace opacities, likely improving edema. Mild residual vascular congestion and interstitial opacities. No effusions or acute bony abnormality.  IMPRESSION: Significant improvement in pulmonary edema/ CHF.  3. TTE 12/26/16 Study Conclusions  - Left ventricle: The cavity size was moderately dilated. Systolic   function was severely reduced. The estimated ejection fraction   was 15%. Diffuse hypokinesis. Doppler parameters are consistent   with restrictive physiology, indicative of decreased left   ventricular diastolic compliance and/or increased left atrial   pressure. - Aortic root: The aortic root was mildly  dilated. - Mitral valve: Calcified annulus. There was mild regurgitation. - Left atrium: The atrium was  severely dilated. - Pericardium, extracardiac: A trivial pericardial effusion was   identified.  Impressions:  - Technically difficult; severe global reduction in LV systolic   function; restrictive filling; mildly dilated aortic root; mild   MR; severe LAE.   Discharge Instructions    (HEART FAILURE PATIENTS) Call MD:  Anytime you have any of the following symptoms: 1) 3 pound weight gain in 24 hours or 5 pounds in 1 week 2) shortness of breath, with or without a dry hacking cough 3) swelling in the hands, feet or stomach 4) if you have to sleep on extra pillows at night in order to breathe.    Complete by:  As directed    Call MD for:  difficulty breathing, headache or visual disturbances    Complete by:  As directed    Call MD for:  persistant dizziness or light-headedness    Complete by:  As directed    Call MD for:  temperature >100.4    Complete by:  As directed    Diet - low sodium heart healthy    Complete by:  As directed    Diet - low sodium heart healthy    Complete by:  As directed    Discharge instructions    Complete by:  As directed    Follow up on medication adherence, alcohol cessation, smoking cessation, and heart failure symptoms such as shortness of breath, orthopnea, peripheral edema.   Increase activity slowly    Complete by:  As directed    Increase activity slowly    Complete by:  As directed      Allergies as of 12/27/2016   No Known Allergies     Medication List    STOP taking these medications   FLUoxetine 20 MG capsule Commonly known as:  PROZAC   furosemide 80 MG tablet Commonly known as:  LASIX   lisinopril 2.5 MG tablet Commonly known as:  PRINIVIL,ZESTRIL   potassium chloride 10 MEQ tablet Commonly known as:  K-DUR,KLOR-CON   potassium gluconate 595 (99 K) MG Tabs tablet     TAKE these medications   albuterol 108 (90 Base)  MCG/ACT inhaler Commonly known as:  PROVENTIL HFA;VENTOLIN HFA Inhale 1 puff into the lungs every 6 (six) hours as needed for wheezing or shortness of breath.     amiodarone 200 MG tablet Commonly known as:  PACERONE Take 1 tablet (200 mg total) by mouth daily. Start taking on:  12/28/2016    aspirin 81 MG chewable tablet Chew 81 mg by mouth daily.      atorvastatin 80 MG tablet Commonly known as:  LIPITOR Take 1 tablet (80 mg total) by mouth daily at 6 PM.    guaiFENesin 600 MG 12 hr tablet Commonly known as:  MUCINEX Take 600 mg by mouth 2 (two) times daily as needed for cough.   IMMUNE HEALTH BLEND PO Take 1 capsule by mouth daily. With Rose Hips   losartan 25 MG tablet Commonly known as:  COZAAR Take 1 tablet (25 mg total) by mouth 2 (two) times daily.      pantoprazole 40 MG tablet Commonly known as:  PROTONIX Take 1 tablet (40 mg total) by mouth daily. Start taking on:  12/28/2016    PARoxetine 20 MG tablet Commonly known as:  PAXIL Take 1 tablet (20 mg total) by mouth daily. Start taking on:  12/28/2016   potassium chloride 20 MEQ/15ML (10%) Soln Take 30 mLs (40 mEq total) by mouth daily. Start taking  on:  12/28/2016      rivaroxaban 20 MG Tabs tablet Commonly known as:  XARELTO Take 1 tablet (20 mg total) by mouth daily with supper.    spironolactone 25 MG tablet Commonly known as:  ALDACTONE Take 1 tablet (25 mg total) by mouth daily. Start taking on:  12/28/2016   torsemide 20 MG tablet Commonly known as:  DEMADEX Take 2 tablets (40 mg total) by mouth 2 (two) times daily.      Follow-up Information    [name], MD. Schedule an appointment as soon as possible for a visit in 1 week(s).   Specialty:  Family Medicine Contact information: [address] [place] [address] [phone]        Graciella Freer, PA-C Follow up on 01/04/2017.   Specialty:  Physician Assistant Why:  at 9:00 am for hospital follow up  with Dr. Alford Highland PA. Garage code is [id number].  Contact information: [address] [place] [address] [phone]           Signed: [name] 12/27/2016, 1:27 PM

## 2016-12-27 NOTE — Discharge Instructions (Signed)
Mr. Stetz,  Bonita Quin were in the hospital for respiratory distress due to fluid on your lungs from your heart failure. You were treated with IV medications to help get the fluid off and your medications were adjusted to help stabilize and hopefully improve your heart function. Please note the following changes to your medication regimen: - We discontinued your CoReg (carvedilol) due to your blood pressure - We discontinued your water pill, Lasix, and switched you to a stronger version of the water pill called Torsemide - We increased the dose of your Spironolactone to help with your heart  It will be very important for you to do the following: - Take your medications as prescribed - Cut back and eventually quit alcohol use - Cut back and eventually quit tobacco use - Limit your fluids to less than 2 liters per day - Limit your salt to less than 2 grams per day - Monitor your weight; call your doctor if your weight goes up by 3 lbs in one day or 5 lbs in a week - Follow up with your Primary Care Physician and the Heart Failure Clinic

## 2016-12-27 NOTE — Progress Notes (Signed)
[place] to be D/C'd Home per MD order. Discussed with the patient and all questions fully answered.    VVS, Skin clean, dry and intact without evidence of skin break down, no evidence of skin tears noted.  IV catheter discontinued intact. Site without signs and symptoms of complications. Dressing and pressure applied.  An After Visit Summary was printed and given to the patient.  Patient escorted via WC, and D/C home via private auto.  Kai Levins  12/27/2016 2:18 PM

## 2016-12-27 NOTE — Progress Notes (Signed)
Advanced Heart Failure Rounding Note  PCP: None  Primary Cardiologist: Dr. Antoine Poche  Subjective:    Admitted 12/25/16 with acute on chronic systolic CHF. Echo with EF 15%, prior Echo in 2016 with EF 20%.  Diuresed with 80mg  IV lasix BID.   Weight down 13 pounds, no urine recorded overnight. Creatinine remains normal.   Overall feeling much better. Still with some orthopnea. Denies PND, chest pain and palpitations.    Objective:   Weight Range: 294 lb 8 oz (133.6 kg) Body mass index is 35.85 kg/m.   Vital Signs:   Temp:  [97.8 F (36.6 C)-98.3 F (36.8 C)] 98.3 F (36.8 C) (04/11 0448) Pulse Rate:  [68-71] 71 (04/11 0448) Resp:  [18] 18 (04/11 0448) BP: (99-105)/(60-61) 105/61 (04/11 0448) SpO2:  [98 %-100 %] 100 % (04/11 0448) Weight:  [294 lb 8 oz (133.6 kg)] 294 lb 8 oz (133.6 kg) (04/11 0448) Last BM Date: 12/26/16  Weight change: Filed Weights   12/25/16 1604 12/26/16 0439 12/27/16 0448  Weight: (!) 307 lb 12.8 oz (139.6 kg) 299 lb 6.4 oz (135.8 kg) 294 lb 8 oz (133.6 kg)    Intake/Output:   Intake/Output Summary (Last 24 hours) at 12/27/16 0737 Last data filed at 12/26/16 2127  Gross per 24 hour  Intake              480 ml  Output                0 ml  Net              480 ml     Physical Exam: General:  Obese male, NAD.  HEENT: normal Neck: supple. 5-6 cm JVP . Carotids 2+ bilat; no bruits. No lymphadenopathy or thyromegaly appreciated. Cor: PMI nondisplaced. Regular rate & rhythm. No rubs, gallops or murmurs. Lungs: clear in all lobes Abdomen: soft, nontender, nondistended. No hepatosplenomegaly. No bruits or masses. Good bowel sounds. Extremities: no cyanosis, clubbing, rash, edema Neuro: alert & orientedx3, cranial nerves grossly intact. moves all 4 extremities w/o difficulty. Affect pleasant   Telemetry: V paced in the 60's.   Labs: CBC  Recent Labs  12/25/16 0600 12/25/16 0606 12/26/16 0225  WBC 11.8*  --  5.1  NEUTROABS 6.1  --   --    HGB 13.8 13.9 11.3*  HCT 41.5 41.0 34.3*  MCV 94.3  --  93.0  PLT 259  --  141*   Basic Metabolic Panel  Recent Labs  12/26/16 0225 12/27/16 0302  NA 139 142  K 3.1* 3.8  CL 101 103  CO2 30 28  GLUCOSE 124* 124*  BUN 14 19  CREATININE 0.84 0.81  CALCIUM 9.0 9.4   Liver Function Tests  Recent Labs  12/25/16 0600  AST 34  ALT 40  ALKPHOS 105  BILITOT 1.1  PROT 7.1  ALBUMIN 4.2   Cardiac Enzymes  Recent Labs  12/25/16 1414 12/25/16 1843 12/26/16 0526  TROPONINI 0.07* 0.07* 0.04*    BNP: BNP (last 3 results)  Recent Labs  12/25/16 0600  BNP 1,193.9*    Hemoglobin A1C  Recent Labs  12/25/16 1414  HGBA1C 5.2   Thyroid Function Tests  Recent Labs  12/25/16 1015  TSH 1.150   Transthoracic Echocardiography 12/26/16 Study Conclusions  - Left ventricle: The cavity size was moderately dilated. Systolic   function was severely reduced. The estimated ejection fraction   was 15%. Diffuse hypokinesis. Doppler parameters are consistent   with restrictive  physiology, indicative of decreased left   ventricular diastolic compliance and/or increased left atrial   pressure. - Aortic root: The aortic root was mildly dilated. - Mitral valve: Calcified annulus. There was mild regurgitation. - Left atrium: The atrium was severely dilated. - Pericardium, extracardiac: A trivial pericardial effusion was   identified.  Impressions:  - Technically difficult; severe global reduction in LV systolic   function; restrictive filling; mildly dilated aortic root; mild   MR; severe LAE.     Medications:     Scheduled Medications: . amiodarone  200 mg Oral Daily  . aspirin EC  81 mg Oral Daily  . atorvastatin  80 mg Oral q1800  . folic acid  1 mg Oral Daily  . losartan  25 mg Oral BID  . mouth rinse  15 mL Mouth Rinse BID  . multivitamin with minerals  1 tablet Oral Daily  . pantoprazole  40 mg Oral Daily  . PARoxetine  20 mg Oral Daily  .  rivaroxaban  20 mg Oral Q supper  . sodium chloride flush  3 mL Intravenous Q12H  . spironolactone  25 mg Oral Daily  . thiamine  100 mg Oral Daily   Or  . thiamine  100 mg Intravenous Daily     Infusions:   PRN Medications:  acetaminophen **OR** acetaminophen, LORazepam **OR** LORazepam, senna-docusate   Assessment/Plan   1. Acute on chronic systolic CHF due to NICM - Echo 07/2015 with LVEF 20-25%. Echo 12/26/16 with EF 15%.  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  - On losartan (starting today) with plans to transition to [facility]. - Continue spiro 25 mg daily.  - Reinforced fluid restriction to < 2 L daily, sodium restriction to less than 2000 mg daily, and the importance of daily weights.  2. PAF on Xarelto - He remains in NSR on amiodarone.  - CHA2DS2/VASc at least 3, continue Xarelto.  - Pacer interrogated Bi-V paced 96.8%.  3. OSA with CAP use - Continue nightly CPAP.  4. ETOH abuse - Discussed cessation. He is a heavy drinker at times (20+ beers), this is contributory to his NICM. 5. Tobacco abuse - Minimal. Encouraged to stop completely 6. Substance abuse  - Daily THC use. He is not interested in quitting. Says it helps with his anxiety.  7. Hypokalemia - Increase spiro as above. Received supp. - K is 3.8  Length of Stay: 2  [name] Ishikawa, NP  12/27/2016, 7:37 AM  Advanced Heart Failure Team Pager [phone] (M-F; 7a - 4p)  Please contact [facility] for night-coverage after hours (4p -7a ) and weekends on [email or web address]  Patient seen with NP, agree with the above note.  Acute on chronic systolic CHF, ischemic cardiomyopathy.  Echo this admission with EF 15%, RV function ok.  He was admitted with increased dyspnea and PND.  On questioning, he is a heavy drinker, up to 20 beers/day on race days.  He has diuresed well so far with weight down again today.  He looks near-euvolemic. His device is BiV pacing  appropriately.  - Transition to torsemide 40 mg po bid. Take KCl 20 mEq daily at home.  - Continue losartan,  plan to get him on Entresto eventually.  - Continue spironolactone 25 mg daily.  - Add Coreg eventually, BP somewhat soft today so will do as outpatient.  - Discussed cutting back significantly on ETOH.   OK for home  today.   Cardiac meds: torsemide 40 mg bid, KCl 20 daily, spironolactone 25 daily, losartan 25 bid, amiodarone 200 daily, Xarelto 20 daily.   Marca Ancona 12/27/2016 1:12 PM

## 2016-12-28 LAB — CULTURE, BLOOD (ROUTINE X 2): SPECIAL REQUESTS: ADEQUATE

## 2016-12-30 LAB — CULTURE, BLOOD (ROUTINE X 2)
Culture: NO GROWTH
Special Requests: ADEQUATE

## 2017-01-04 ENCOUNTER — Inpatient Hospital Stay (HOSPITAL_COMMUNITY): Admit: 2017-01-04 | Payer: Medicaid Other

## 2017-01-15 ENCOUNTER — Other Ambulatory Visit: Payer: Self-pay | Admitting: Oncology

## 2017-01-16 ENCOUNTER — Other Ambulatory Visit: Payer: Self-pay | Admitting: Oncology

## 2017-01-17 ENCOUNTER — Other Ambulatory Visit (HOSPITAL_COMMUNITY): Payer: Self-pay | Admitting: Cardiology

## 2017-01-17 ENCOUNTER — Telehealth (HOSPITAL_COMMUNITY): Payer: Self-pay | Admitting: Vascular Surgery

## 2017-01-17 MED ORDER — TORSEMIDE 20 MG PO TABS: 40.0000 mg | ORAL_TABLET | Freq: Two times a day (BID) | ORAL | 0 refills | Status: DC

## 2017-01-17 NOTE — Telephone Encounter (Signed)
Refill Torsemide sent to [facility]

## 2017-01-24 ENCOUNTER — Ambulatory Visit (HOSPITAL_COMMUNITY)
Admission: RE | Admit: 2017-01-24 | Discharge: 2017-01-24 | Disposition: A | Discharge status: Home / Self Care | Payer: Medicaid Other | Source: Ambulatory Visit | Attending: Cardiology | Admitting: Cardiology

## 2017-01-24 VITALS — BP 128/86 | HR 75 | Wt 309.0 lb

## 2017-01-24 DIAGNOSIS — I5022 Chronic systolic (congestive) heart failure: Secondary | ICD-10-CM

## 2017-01-24 DIAGNOSIS — I480 Paroxysmal atrial fibrillation: Secondary | ICD-10-CM | POA: Insufficient documentation

## 2017-01-24 DIAGNOSIS — F1010 Alcohol abuse, uncomplicated: Secondary | ICD-10-CM | POA: Insufficient documentation

## 2017-01-24 DIAGNOSIS — G4733 Obstructive sleep apnea (adult) (pediatric): Secondary | ICD-10-CM

## 2017-01-24 DIAGNOSIS — Z7901 Long term (current) use of anticoagulants: Secondary | ICD-10-CM | POA: Diagnosis not present

## 2017-01-24 DIAGNOSIS — Z7982 Long term (current) use of aspirin: Secondary | ICD-10-CM | POA: Diagnosis not present

## 2017-01-24 DIAGNOSIS — I428 Other cardiomyopathies: Secondary | ICD-10-CM | POA: Diagnosis not present

## 2017-01-24 DIAGNOSIS — I10 Essential (primary) hypertension: Secondary | ICD-10-CM

## 2017-01-24 DIAGNOSIS — I5042 Chronic combined systolic (congestive) and diastolic (congestive) heart failure: Secondary | ICD-10-CM | POA: Diagnosis present

## 2017-01-24 DIAGNOSIS — I110 Hypertensive heart disease with heart failure: Secondary | ICD-10-CM | POA: Insufficient documentation

## 2017-01-24 DIAGNOSIS — I4891 Unspecified atrial fibrillation: Secondary | ICD-10-CM

## 2017-01-24 DIAGNOSIS — F1210 Cannabis abuse, uncomplicated: Secondary | ICD-10-CM | POA: Diagnosis not present

## 2017-01-24 DIAGNOSIS — Z95810 Presence of automatic (implantable) cardiac defibrillator: Secondary | ICD-10-CM | POA: Diagnosis not present

## 2017-01-24 LAB — BRAIN NATRIURETIC PEPTIDE: B NATRIURETIC PEPTIDE 5: 436.3 pg/mL — AB (ref 0.0–100.0)

## 2017-01-24 LAB — COMPREHENSIVE METABOLIC PANEL
ALBUMIN: 3.7 g/dL (ref 3.5–5.0)
ALK PHOS: 90 U/L (ref 38–126)
ALT: 26 U/L (ref 17–63)
AST: 29 U/L (ref 15–41)
Anion gap: 8 (ref 5–15)
BUN: 15 mg/dL (ref 6–20)
CALCIUM: 8.6 mg/dL — AB (ref 8.9–10.3)
CO2: 29 mmol/L (ref 22–32)
CREATININE: 0.83 mg/dL (ref 0.61–1.24)
Chloride: 100 mmol/L — ABNORMAL LOW (ref 101–111)
Glucose, Bld: 143 mg/dL — ABNORMAL HIGH (ref 65–99)
Potassium: 3.2 mmol/L — ABNORMAL LOW (ref 3.5–5.1)
SODIUM: 137 mmol/L (ref 135–145)
TOTAL PROTEIN: 5.9 g/dL — AB (ref 6.5–8.1)
Total Bilirubin: 0.8 mg/dL (ref 0.3–1.2)

## 2017-01-24 MED ORDER — TORSEMIDE 20 MG PO TABS: ORAL_TABLET | ORAL | 6 refills | Status: DC

## 2017-01-24 MED ORDER — SACUBITRIL-VALSARTAN 24-26 MG PO TABS: 1.0000 | ORAL_TABLET | Freq: Two times a day (BID) | ORAL | 3 refills | Status: DC

## 2017-01-24 MED ORDER — ALBUTEROL SULFATE HFA 108 (90 BASE) MCG/ACT IN AERS: 1.0000 | INHALATION_SPRAY | Freq: Four times a day (QID) | RESPIRATORY_TRACT | 1 refills | Status: DC | PRN

## 2017-01-24 NOTE — Progress Notes (Signed)
Advanced Heart Failure Medication Review by a Pharmacist  Does the patient  feel that his/her medications are working for him/her?  yes  Has the patient been experiencing any side effects to the medications prescribed?  no  Does the patient measure his/her own blood pressure or blood glucose at home?  no   Does the patient have any problems obtaining medications due to transportation or finances?   No-  Medicaid  Understanding of regimen: fair Understanding of indications: fair Potential of compliance: good Patient understands to avoid NSAIDs. Patient understands to avoid decongestants.  Issues to address at subsequent visits: None   Pharmacist comments: [name] is a pleasant 56 yo M presenting with a recent hospital discharge medication list. He reports good compliance with his regimen but there are multiple discrepancies and duplicates in his list. All were reconciled based on last fill dates at [facility] and I have asked him to follow the medication list we give him today.   Tyler Deis. Bonnye Fava, PharmD, BCPS, CPP Clinical Pharmacist Pager: [phone] Phone: [phone] 01/24/2017 10:23 AM      Time with patient: 10 minutes Preparation and documentation time: 10 minutes Total time: 20 minutes

## 2017-01-24 NOTE — Progress Notes (Signed)
Advanced Heart Failure Clinic Note   Referring Physician: Dr. Cyndie Chime Primary Care: Dr. Windle Guard, MD Primary Cardiologist: Dr. Shona Simpson. [name] [name]  HPI: [name] is a 56 year old male with a past medical history of chronic systolic CHF (last EF 15% in April 2018), dilated NICM s/p bi V ICD, PAF with intermittent compliance with Xarelto, ETOH abuse, tobacco abuse, OSA on CPAP, and HTN.   Admitted 12/25/16-12/27/16 with acute on chronic systolic CHF, volume overload. He was diuresed 11 pounds on IV lasix.  Discharge weight was 294 pounds. It was felt that the etiology of his NICM was ETOH vs hypertension related.   He returns today for HF follow up. Weight up to 309 pounds, weights at home 298-302 pounds. He is weighing daily and recording it. He feels SOB with walking into clinic, has been having more PND. He is SOB at rest at times and with showering. Taking all his medications. Drinking more than 2L a day, drinking 4-5 beers a day. Eating high salt foods. Does not smoke cigarettes. Smoking marijuana daily. He does wear his CPAP at night.     Past Medical History:  Diagnosis Date  . CAD (coronary artery disease)    Mild nonobstructive (Cath 09)  . Chronic systolic heart failure (HCC)    Nonischemic CM: echo 4/12 with EF 25-30%, grade 2 diast dysfxn, mild dilated aortic root 43 mm, trivial MR, mod LAE  . COPD (chronic obstructive pulmonary disease) (HCC)   . Dyspnea   . H/O ETOH abuse   . HTN (hypertension)   . Hypersomnia   . Obesity, morbid (HCC)   . OSA on CPAP   . Systolic and diastolic CHF, acute on chronic (HCC) 11/2016  . Tobacco use disorder   . Ventricular tachycardia [facility])     Current Outpatient Prescriptions  Medication Sig Dispense Refill  . acetaminophen (TYLENOL) 325 MG tablet Take 2 tablets (650 mg total) by mouth every 4 (four) hours as needed for headache or mild pain.    Marland Kitchen albuterol (PROVENTIL HFA;VENTOLIN HFA) 108 (90 Base) MCG/ACT inhaler Inhale 1 puff  into the lungs every 6 (six) hours as needed for wheezing or shortness of breath.    Marland Kitchen amiodarone (PACERONE) 200 MG tablet Take 1 tablet (200 mg total) by mouth daily. 30 tablet 0  . aspirin 81 MG chewable tablet Chew 81 mg by mouth daily.    Marland Kitchen atorvastatin (LIPITOR) 80 MG tablet Take 1 tablet (80 mg total) by mouth daily at 6 PM. 30 tablet 0  . glucosamine-chondroitin 500-400 MG tablet Take 2 tablets by mouth daily.    Marland Kitchen guaiFENesin (MUCINEX) 600 MG 12 hr tablet Take 600 mg by mouth 2 (two) times daily as needed for cough.    . losartan (COZAAR) 25 MG tablet Take 25 mg by mouth daily.    . pantoprazole (PROTONIX) 40 MG tablet Take 1 tablet (40 mg total) by mouth daily. 30 tablet 0  . PARoxetine (PAXIL) 20 MG tablet Take 1 tablet (20 mg total) by mouth daily. 30 tablet 0  . potassium chloride 20 MEQ/15ML (10%) SOLN Take 30 mLs (40 mEq total) by mouth daily. 900 mL 0  . rivaroxaban (XARELTO) 20 MG TABS tablet Take 1 tablet (20 mg total) by mouth daily with supper. 30 tablet 0  . spironolactone (ALDACTONE) 25 MG tablet Take 1 tablet (25 mg total) by mouth daily. 30 tablet 0  . torsemide (DEMADEX) 20 MG tablet Take 2 tablets (40 mg total) by mouth  2 (two) times daily. 60 tablet 0   No current facility-administered medications for this encounter.     No Known Allergies    Social History   Social History  . Marital status: Single    Spouse name: N/A  . Number of children: 2  . Years of education: N/A   Occupational History  . scrap metal   . disabled, odd jobs    Social History Main Topics  . Smoking status: Current Some Day Smoker    Packs/day: 0.50    Years: 4.00    Types: Cigarettes  . Smokeless tobacco: Never Used     Comment: about a pack a week. social  . Alcohol use 25.2 oz/week    42 Cans of beer per week     Comment: 6 beers per day  . Drug use: Yes    Frequency: 7.0 times per week    Types: Codeine, Marijuana     Comment: has discontinued  . Sexual activity: Yes     Partners: Female   Other Topics Concern  . Not on file   Social History Narrative   ** Merged History Encounter **          Family History  Problem Relation Age of Onset  . Other Mother     cardiac surgery. late 1990s  . Congestive Heart Failure Father   . Heart Problems Mother     CABG AGE 68  . Healthy Father     AGE 73  . Coronary artery disease    . Healthy Brother     AGE 60  . Healthy Sister     AGE 43  . Healthy Sister     AGE 57  . Healthy Son   . Healthy Son   . Healthy Daughter     Vitals:   01/24/17 1007  BP: 128/86  Pulse: 75  SpO2: 97%  Weight: (!) 309 lb (140.2 kg)     PHYSICAL EXAM: General: Male, NAD. Walked into clinic without difficulty.   HEENT: normal Neck: supple. 7-8 cm JVD. Carotids 2+ bilat; no bruits. No lymphadenopathy or thyromegaly appreciated. Cor: PMI nondisplaced. irregular rate & rhythm. No rubs, gallops or murmurs. Lungs: clear in all lobes, no wheezing.  Abdomen: soft, nontender, distended. No hepatosplenomegaly. No bruits or masses. Good bowel sounds. Extremities: no cyanosis, clubbing, rash. 1+ pedal edema.  Neuro: alert & oriented x 3, cranial nerves grossly intact. moves all 4 extremities w/o difficulty. Affect pleasant.    ASSESSMENT & PLAN: 1. Chronic combined systolic and diastolic CHF: NICM, EF 15%. Normal cors in July 2016. ETOH vs. Hypertension - NYHA III - Volume status elevated. Optivol with reduced thoracic impedence and increased fluid threshold indicating increased amount of fluid.  - Increase torsemide to 60mg  in the am, 40mg  in the pm.   - Start Nelva Nay 24/26mg  BID.  - Has Bi-V ICD, follows with Dr. Graciela Husbands - Continue Cleda Daub 25mg  daily.  - No beta blocker until volume status improved.  - Educated him about the importance of low salt foods, and drinking less than 2L a day.  2. PAF - Continue Amiodarone, advised him to get yearly eye exams.  - Will check LFT's today.  - TSH 1.1 in April 2018. -This  patients CHA2DS2-VASc Score and unadjusted Ischemic Stroke Rate (% per year) is equal to 2.2 % stroke rate/year from a score of 2 Above score calculated as 1 point each if present [CHF, HTN, DM, Vascular=MI/PAD/Aortic Plaque, Age if 12-74,  or Male], 2 points each if present [Age > 75, or Stroke/TIA/TE] - Continue Xarelto for anticoagulation.  3. ETOH abuse - Continues to drink daily.  - He says he will quit drinking, offered AA resources, he declined 4. Tobacco abuse - Not smoking cigarettes.  5. Substance abuse - Continues to smoke marijuana daily.  - Encouraged him to quit.   CMET, BNP today. Follow up in 2 weeks.    [name], NP 01/24/17

## 2017-01-24 NOTE — Patient Instructions (Signed)
Routine lab work today. Will notify you of abnormal results, otherwise no news is good news!  INCREASE Torsemide to 60 mg (3 tabs) in am and 40 mg (2 tabs) in pm.  Refill Albuterol inhaler.  START Entresto 24/26 mg tablet (Take 1 tab twice daily). Use 30 day free card at your pharmacy. Call [facility] CHF clinical pharmacist for any other medication needs.  Follow up 2 weeks.  Do the following things EVERYDAY: 1) Weigh yourself in the morning before breakfast. Write it down and keep it in a log. 2) Take your medicines as prescribed 3) Eat low salt foods-Limit salt (sodium) to 2000 mg per day.  4) Stay as active as you can everyday 5) Limit all fluids for the day to less than 2 liters

## 2017-01-26 ENCOUNTER — Other Ambulatory Visit: Payer: Self-pay | Admitting: Oncology

## 2017-01-29 ENCOUNTER — Inpatient Hospital Stay (HOSPITAL_COMMUNITY)
Admission: EM | Admit: 2017-01-29 | Discharge: 2017-01-31 | DRG: 291 | Disposition: A | Discharge status: Home / Self Care | Payer: Medicaid Other | Attending: Internal Medicine | Admitting: Internal Medicine

## 2017-01-29 ENCOUNTER — Encounter (HOSPITAL_COMMUNITY): Payer: Self-pay

## 2017-01-29 ENCOUNTER — Emergency Department (HOSPITAL_COMMUNITY): Payer: Medicaid Other

## 2017-01-29 DIAGNOSIS — Z79899 Other long term (current) drug therapy: Secondary | ICD-10-CM | POA: Diagnosis not present

## 2017-01-29 DIAGNOSIS — N179 Acute kidney failure, unspecified: Secondary | ICD-10-CM | POA: Diagnosis present

## 2017-01-29 DIAGNOSIS — I480 Paroxysmal atrial fibrillation: Secondary | ICD-10-CM | POA: Diagnosis present

## 2017-01-29 DIAGNOSIS — I2510 Atherosclerotic heart disease of native coronary artery without angina pectoris: Secondary | ICD-10-CM | POA: Diagnosis present

## 2017-01-29 DIAGNOSIS — I77810 Thoracic aortic ectasia: Secondary | ICD-10-CM | POA: Diagnosis present

## 2017-01-29 DIAGNOSIS — Z7982 Long term (current) use of aspirin: Secondary | ICD-10-CM

## 2017-01-29 DIAGNOSIS — F1021 Alcohol dependence, in remission: Secondary | ICD-10-CM | POA: Diagnosis present

## 2017-01-29 DIAGNOSIS — F1290 Cannabis use, unspecified, uncomplicated: Secondary | ICD-10-CM | POA: Diagnosis present

## 2017-01-29 DIAGNOSIS — J810 Acute pulmonary edema: Secondary | ICD-10-CM

## 2017-01-29 DIAGNOSIS — I5023 Acute on chronic systolic (congestive) heart failure: Secondary | ICD-10-CM | POA: Diagnosis present

## 2017-01-29 DIAGNOSIS — Z9989 Dependence on other enabling machines and devices: Secondary | ICD-10-CM

## 2017-01-29 DIAGNOSIS — I429 Cardiomyopathy, unspecified: Secondary | ICD-10-CM | POA: Diagnosis present

## 2017-01-29 DIAGNOSIS — Z7901 Long term (current) use of anticoagulants: Secondary | ICD-10-CM | POA: Diagnosis not present

## 2017-01-29 DIAGNOSIS — I10 Essential (primary) hypertension: Secondary | ICD-10-CM | POA: Diagnosis present

## 2017-01-29 DIAGNOSIS — Z6836 Body mass index (BMI) 36.0-36.9, adult: Secondary | ICD-10-CM | POA: Diagnosis not present

## 2017-01-29 DIAGNOSIS — R739 Hyperglycemia, unspecified: Secondary | ICD-10-CM | POA: Diagnosis present

## 2017-01-29 DIAGNOSIS — Z87891 Personal history of nicotine dependence: Secondary | ICD-10-CM

## 2017-01-29 DIAGNOSIS — J449 Chronic obstructive pulmonary disease, unspecified: Secondary | ICD-10-CM | POA: Diagnosis present

## 2017-01-29 DIAGNOSIS — I5043 Acute on chronic combined systolic (congestive) and diastolic (congestive) heart failure: Secondary | ICD-10-CM | POA: Diagnosis present

## 2017-01-29 DIAGNOSIS — G4733 Obstructive sleep apnea (adult) (pediatric): Secondary | ICD-10-CM | POA: Diagnosis present

## 2017-01-29 DIAGNOSIS — I4891 Unspecified atrial fibrillation: Secondary | ICD-10-CM

## 2017-01-29 DIAGNOSIS — I110 Hypertensive heart disease with heart failure: Principal | ICD-10-CM | POA: Diagnosis present

## 2017-01-29 DIAGNOSIS — Z95810 Presence of automatic (implantable) cardiac defibrillator: Secondary | ICD-10-CM | POA: Diagnosis not present

## 2017-01-29 DIAGNOSIS — I340 Nonrheumatic mitral (valve) insufficiency: Secondary | ICD-10-CM | POA: Diagnosis present

## 2017-01-29 DIAGNOSIS — J9601 Acute respiratory failure with hypoxia: Secondary | ICD-10-CM | POA: Diagnosis present

## 2017-01-29 DIAGNOSIS — Z8679 Personal history of other diseases of the circulatory system: Secondary | ICD-10-CM

## 2017-01-29 DIAGNOSIS — I428 Other cardiomyopathies: Secondary | ICD-10-CM

## 2017-01-29 LAB — I-STAT TROPONIN, ED: TROPONIN I, POC: 0.02 ng/mL (ref 0.00–0.08)

## 2017-01-29 LAB — CBC WITH DIFFERENTIAL/PLATELET
Basophils Absolute: 0 10*3/uL (ref 0.0–0.1)
Basophils Relative: 0 %
Eosinophils Absolute: 0.3 10*3/uL (ref 0.0–0.7)
Eosinophils Relative: 2 %
HEMATOCRIT: 40.5 % (ref 39.0–52.0)
Hemoglobin: 13.7 g/dL (ref 13.0–17.0)
Lymphocytes Relative: 32 %
Lymphs Abs: 5 10*3/uL — ABNORMAL HIGH (ref 0.7–4.0)
MCH: 31.4 pg (ref 26.0–34.0)
MCHC: 33.8 g/dL (ref 30.0–36.0)
MCV: 92.9 fL (ref 78.0–100.0)
Monocytes Absolute: 0.6 10*3/uL (ref 0.1–1.0)
Monocytes Relative: 4 %
NEUTROS PCT: 62 %
Neutro Abs: 9.5 10*3/uL — ABNORMAL HIGH (ref 1.7–7.7)
PLATELETS: 243 10*3/uL (ref 150–400)
RBC: 4.36 MIL/uL (ref 4.22–5.81)
RDW: 14.3 % (ref 11.5–15.5)
WBC: 15.4 10*3/uL — AB (ref 4.0–10.5)

## 2017-01-29 LAB — COMPREHENSIVE METABOLIC PANEL
ALT: 43 U/L (ref 17–63)
AST: 54 U/L — ABNORMAL HIGH (ref 15–41)
Albumin: 4.2 g/dL (ref 3.5–5.0)
Alkaline Phosphatase: 117 U/L (ref 38–126)
Anion gap: 14 (ref 5–15)
BILIRUBIN TOTAL: 1 mg/dL (ref 0.3–1.2)
BUN: 15 mg/dL (ref 6–20)
CHLORIDE: 101 mmol/L (ref 101–111)
CO2: 23 mmol/L (ref 22–32)
Calcium: 9.3 mg/dL (ref 8.9–10.3)
Creatinine, Ser: 1.43 mg/dL — ABNORMAL HIGH (ref 0.61–1.24)
GFR, EST NON AFRICAN AMERICAN: 54 mL/min — AB (ref >60)
Glucose, Bld: 284 mg/dL — ABNORMAL HIGH (ref 65–99)
POTASSIUM: 3.7 mmol/L (ref 3.5–5.1)
Sodium: 138 mmol/L (ref 135–145)
TOTAL PROTEIN: 7.1 g/dL (ref 6.5–8.1)

## 2017-01-29 LAB — TROPONIN I
TROPONIN I: 0.06 ng/mL — AB (ref <0.03)
TROPONIN I: 0.06 ng/mL — AB (ref <0.03)

## 2017-01-29 LAB — GLUCOSE, CAPILLARY
Glucose-Capillary: 188 mg/dL — ABNORMAL HIGH (ref 65–99)
Glucose-Capillary: 132 mg/dL — ABNORMAL HIGH (ref 65–99)
Glucose-Capillary: 127 mg/dL — ABNORMAL HIGH (ref 65–99)

## 2017-01-29 LAB — BRAIN NATRIURETIC PEPTIDE: B NATRIURETIC PEPTIDE 5: 557.1 pg/mL — AB (ref 0.0–100.0)

## 2017-01-29 LAB — MRSA PCR SCREENING: MRSA BY PCR: NEGATIVE

## 2017-01-29 MED ORDER — THIAMINE HCL 100 MG/ML IJ SOLN: 100.0000 mg | Freq: Every day | INTRAMUSCULAR | Status: DC

## 2017-01-29 MED ORDER — FUROSEMIDE 10 MG/ML IJ SOLN
80.0000 mg | Freq: Once | INTRAMUSCULAR | Status: AC
  Administered 2017-01-29: 80 mg via INTRAVENOUS
  Filled 2017-01-29: qty 8

## 2017-01-29 MED ORDER — PANTOPRAZOLE SODIUM 40 MG PO TBEC
40.0000 mg | DELAYED_RELEASE_TABLET | Freq: Every day | ORAL | Status: DC
  Administered 2017-01-29 – 2017-01-31 (×3): 40 mg via ORAL
  Filled 2017-01-29 (×3): qty 1

## 2017-01-29 MED ORDER — PNEUMOCOCCAL VAC POLYVALENT 25 MCG/0.5ML IJ INJ
0.5000 mL | INJECTION | INTRAMUSCULAR | Status: DC
  Filled 2017-01-29: qty 0.5

## 2017-01-29 MED ORDER — ATORVASTATIN CALCIUM 80 MG PO TABS
80.0000 mg | ORAL_TABLET | Freq: Every day | ORAL | Status: DC
  Administered 2017-01-29 – 2017-01-30 (×2): 80 mg via ORAL
  Filled 2017-01-29 (×2): qty 1

## 2017-01-29 MED ORDER — ADULT MULTIVITAMIN W/MINERALS CH
1.0000 | ORAL_TABLET | Freq: Every day | ORAL | Status: DC
  Administered 2017-01-29 – 2017-01-31 (×3): 1 via ORAL
  Filled 2017-01-29 (×3): qty 1

## 2017-01-29 MED ORDER — AMIODARONE HCL 200 MG PO TABS
200.0000 mg | ORAL_TABLET | Freq: Every day | ORAL | Status: DC
  Administered 2017-01-29 – 2017-01-31 (×3): 200 mg via ORAL
  Filled 2017-01-29 (×3): qty 1

## 2017-01-29 MED ORDER — ACETAMINOPHEN 325 MG PO TABS
650.0000 mg | ORAL_TABLET | Freq: Four times a day (QID) | ORAL | Status: DC | PRN
  Administered 2017-01-29: 650 mg via ORAL
  Filled 2017-01-29: qty 2

## 2017-01-29 MED ORDER — RIVAROXABAN 20 MG PO TABS: 20.0000 mg | ORAL_TABLET | Freq: Every day | ORAL | Status: DC

## 2017-01-29 MED ORDER — SPIRONOLACTONE 25 MG PO TABS
25.0000 mg | ORAL_TABLET | Freq: Every day | ORAL | Status: DC
  Administered 2017-01-29 – 2017-01-31 (×3): 25 mg via ORAL
  Filled 2017-01-29 (×3): qty 1

## 2017-01-29 MED ORDER — LORAZEPAM 1 MG PO TABS: 1.0000 mg | ORAL_TABLET | Freq: Four times a day (QID) | ORAL | Status: DC | PRN

## 2017-01-29 MED ORDER — ACETAMINOPHEN 650 MG RE SUPP: 650.0000 mg | Freq: Four times a day (QID) | RECTAL | Status: DC | PRN

## 2017-01-29 MED ORDER — PAROXETINE HCL 20 MG PO TABS
20.0000 mg | ORAL_TABLET | Freq: Every day | ORAL | Status: DC
  Administered 2017-01-29 – 2017-01-31 (×3): 20 mg via ORAL
  Filled 2017-01-29 (×3): qty 1

## 2017-01-29 MED ORDER — ASPIRIN 81 MG PO CHEW
81.0000 mg | CHEWABLE_TABLET | Freq: Every day | ORAL | Status: DC
  Administered 2017-01-29 – 2017-01-31 (×3): 81 mg via ORAL
  Filled 2017-01-29 (×3): qty 1

## 2017-01-29 MED ORDER — RIVAROXABAN 20 MG PO TABS
20.0000 mg | ORAL_TABLET | Freq: Every day | ORAL | Status: DC
  Administered 2017-01-29 – 2017-01-30 (×2): 20 mg via ORAL
  Filled 2017-01-29 (×2): qty 1

## 2017-01-29 MED ORDER — FOLIC ACID 1 MG PO TABS
1.0000 mg | ORAL_TABLET | Freq: Every day | ORAL | Status: DC
  Administered 2017-01-29 – 2017-01-31 (×3): 1 mg via ORAL
  Filled 2017-01-29 (×3): qty 1

## 2017-01-29 MED ORDER — LORAZEPAM 2 MG/ML IJ SOLN: 1.0000 mg | Freq: Four times a day (QID) | INTRAMUSCULAR | Status: DC | PRN

## 2017-01-29 MED ORDER — SACUBITRIL-VALSARTAN 24-26 MG PO TABS
1.0000 | ORAL_TABLET | Freq: Two times a day (BID) | ORAL | Status: DC
  Administered 2017-01-29 – 2017-01-31 (×5): 1 via ORAL
  Filled 2017-01-29 (×5): qty 1

## 2017-01-29 MED ORDER — VITAMIN B-1 100 MG PO TABS
100.0000 mg | ORAL_TABLET | Freq: Every day | ORAL | Status: DC
  Administered 2017-01-29 – 2017-01-31 (×3): 100 mg via ORAL
  Filled 2017-01-29 (×3): qty 1

## 2017-01-29 MED ORDER — POTASSIUM CHLORIDE 20 MEQ/15ML (10%) PO SOLN
40.0000 meq | Freq: Every day | ORAL | Status: DC
  Administered 2017-01-29 – 2017-01-30 (×2): 40 meq via ORAL
  Filled 2017-01-29 (×2): qty 30

## 2017-01-29 MED ORDER — FUROSEMIDE 10 MG/ML IJ SOLN
80.0000 mg | Freq: Two times a day (BID) | INTRAMUSCULAR | Status: DC
  Administered 2017-01-29 – 2017-01-31 (×5): 80 mg via INTRAVENOUS
  Filled 2017-01-29 (×6): qty 8

## 2017-01-29 NOTE — Progress Notes (Signed)
PMH of CHF/COPD. Pt here for sudden onset of increase WOB, SOB, and diaphoretic. Extremely Dyspneic at Rest.  On arrival noted SATs in the 50's on room air. Respirations were labored, chest wall accessory muscle usage, and tachypneic respirations in the 60's.  Pt was placed on NIV on aggressively settings to help aid his WOB. Once on the NIV machine SATs begin to immediately increase gradually. SATs are now stable, respirations are less labored than initial assessment.

## 2017-01-29 NOTE — H&P (Signed)
History and Physical  Patient Name: [name] [name]     ZOX:[id number]    DOB: Jun 29, 1961    DOA: 01/29/2017 PCP: [name], MD   Patient coming from: Home  Chief Complaint: Dyspnea  HPI: [name] [name] is a 56 y.o. male with a past medical history significant for CHF EF 15%, NICM, ICD and BiV, OSA on CPAP, pAF on amio and Xarelto who presents with acute nocturnal dyspnea and hypoxia.  It appears from his chart that he first had acute CHF in 2009 in the setting of heavy alcohol use and cocaine use, LHC at that time minimal plaque, EF <25%.  Started following with Dr. Antoine Poche and Dr. [name], was still working and functional until around 2013.  He then was lost to follow up until he was admitted with CHF again in 2016, this time had new Afib started on anticoagulation, ICD/BiV placed.    Since then he has had more or less regular follow up but is now on Disability.  I don't see that he was admitted at all again until last month, when he presented with nocturnal dyspnea, pulmonary edema and hypoxia requiring BiPAP.  EF by echo was 15-20%.  Was evaluated by CHF team who converted to torsemide, titrated up spironolactone and converted lisinopril to losartan in preparation for Entresto.    Since discharge, patient has been doing well.  He was initially drinking alcohol daily still, "a few beers", but had quit smoking tobacco.  At his appointment last Wednesday, provider reports that he was still drinking, consuming more than 2L water, and high salt foods.  To me he reports that he stopped as of that appointment, does not intend to restart alcohol.  At that appointment, he was dyspneic with exertion and his torsemide was titrated up slightly, which he has been doing.  To me the patient states that he was okay, until Sunday he noticed he could only walk halfway to his parents' house nextdoor, whereas usually he can walk all the way without stopping to rest.  Then when he was there he had "a few glasses of  tea" and supper, and then came home and had some cokes and perhaps other drinks (not alcohol).  Finally, tonight, around midnight, he woke up to go to the bathroom, was diaphoretic and couldn't breathe.  He sat there for an hour before his girlfriend called 9-1-1.  ED course: -Afebrile, heart rate 102, respirations >50, BP 166/107, pulse ox 58% -Na 138, K 3.7, Cr 1.43 (baseline 0.8 5 days ago), WBC 15.4K, Hgb 13.7 -BNP 557 -Troponin negative -ECG showed IVCD, similar to last admission, paced -CXR showed bilateral opacities consistent with edema -He was given Lasix IV 80 mg and placed on BiPAP and his breathing improved dramatically and so TRH were asked to evaluate for hypoxic respiratory failure       ROS: Review of Systems  Constitutional: Negative for chills and fever.  Respiratory: Positive for shortness of breath.   Cardiovascular: Positive for PND. Negative for chest pain, orthopnea and leg swelling.  All other systems reviewed and are negative.         Past Medical History:  Diagnosis Date  . CAD (coronary artery disease)    Mild nonobstructive (Cath 09)  . Chronic systolic heart failure (HCC)    Nonischemic CM: echo 4/12 with EF 25-30%, grade 2 diast dysfxn, mild dilated aortic root 43 mm, trivial MR, mod LAE  . COPD (chronic obstructive pulmonary disease) (HCC)   . Dyspnea   .  H/O ETOH abuse   . HTN (hypertension)   . Hypersomnia   . Obesity, morbid (HCC)   . OSA on CPAP   . Systolic and diastolic CHF, acute on chronic (HCC) 11/2016  . Tobacco use disorder   . Ventricular tachycardia [facility])     Past Surgical History:  Procedure Laterality Date  . CARDIAC CATHETERIZATION N/A 04/08/2015   Procedure: Right/Left Heart Cath and Coronary Angiography;  Surgeon: Corky Crafts, MD;  Location: [facility] INVASIVE CV LAB;  Service: Cardiovascular;  Laterality: N/A;  . EP IMPLANTABLE DEVICE N/A 04/14/2015   Procedure: BiV ICD Insertion CRT-D;  Surgeon: [name], MD;   Location: [facility] INVASIVE CV LAB;  Service: Cardiovascular;  Laterality: N/A;  . HYDROCELE EXCISION / REPAIR    . TUMOR EXCISION Left   . 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 VASECTOMY      Social History: Patient lives with his girlfriend.  The patient walks unasisted.  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  He has quit smoking.  He reports quiting alcohol.    No Known Allergies  Family history: family history includes Congestive Heart Failure in his father; Healthy in his brother, daughter, father, sister, sister, son, and son; Heart Problems in his mother; Other in his mother.  Prior to Admission medications   Medication Sig Start Date End Date Taking? Authorizing Provider  acetaminophen (TYLENOL) 325 MG tablet Take 2 tablets (650 mg total) by mouth every 4 (four) hours as needed for headache or mild pain. 04/15/15   [name], NP  albuterol (PROVENTIL HFA;VENTOLIN HFA) 108 (90 Base) MCG/ACT inhaler Inhale 1 puff into the lungs every 6 (six) hours as needed for wheezing or shortness of breath. 01/24/17   [name], NP  amiodarone (PACERONE) 200 MG tablet Take 1 tablet (200 mg total) by mouth daily. 12/28/16   [name], MD  aspirin 81 MG chewable tablet Chew 81 mg by mouth daily.    [provider]  atorvastatin (LIPITOR) 80 MG tablet Take 1 tablet (80 mg total) by mouth daily at 6 PM. 12/27/16   [name], MD  glucosamine-chondroitin 500-400 MG tablet Take 2 tablets by mouth daily.    [provider]  guaiFENesin (MUCINEX) 600 MG 12 hr tablet Take 600 mg by mouth 2 (two) times daily as needed for cough.    [provider]  pantoprazole (PROTONIX) 40 MG tablet Take 1 tablet (40 mg total) by mouth daily. 12/28/16   [name], MD  PARoxetine (PAXIL) 20 MG tablet Take 1 tablet (20 mg total) by mouth daily. 12/28/16   [name], MD  potassium chloride 20 MEQ/15ML (10%)  SOLN Take 30 mLs (40 mEq total) by mouth daily. 12/28/16   [name], MD  rivaroxaban (XARELTO) 20 MG TABS tablet Take 1 tablet (20 mg total) by mouth daily with supper. 12/27/16   [name], MD  sacubitril-valsartan (ENTRESTO) 24-26 MG Take 1 tablet by mouth 2 (two) times daily. 01/24/17   [name], NP  spironolactone (ALDACTONE) 25 MG tablet Take 1 tablet (25 mg total) by mouth daily. 12/28/16   [name], MD  torsemide (DEMADEX) 20 MG tablet Take 60 mg (3 tabs) in am and 40 mg (2 tabs) in pm 01/24/17   [name], NP       Physical Exam: BP 140/65   Pulse 70  Temp 97.9 F (36.6 C) (Oral)   Resp 20   SpO2 100%  General appearance: Well-developed, large adult male, alert.   Eyes: Anicteric, conjunctiva pink, lids and lashes normal. PERRL.    ENT: No nasal deformity, discharge, epistaxis.  Hearing normal. OP moist without lesions.  Dentition poor. Neck: No neck masses.  Trachea midline.  No thyromegaly/tenderness. Lymph: No cervical or supraclavicular lymphadenopathy. Skin: Warm and dry.  No jaundice.  No suspicious rashes or lesions. Cardiac: Tachcyardic, regular, nl S1-S2, no murmurs appreciated.  Capillary refill is brisk.  JVP not visible.  No LE edema.  Radial and DP pulses 2+ and symmetric. Respiratory: Normal respiratory rate and rhythm.  Crackles bilaterally. Abdomen: Abdomen soft.  No TTP. No ascites, distension, hepatosplenomegaly.   MSK: No deformities or effusions.  No cyanosis or clubbing. Neuro: Cranial nerves normal.  Sensation intact to light touch. Speech is fluent.  Muscle strength normal.    Psych: Sensorium intact and responding to questions, attention normal.  Behavior appropriate.  Affect normal.  Judgment and insight appear normal.     Labs on Admission:  I have personally reviewed following labs and imaging studies: CBC:  Recent Labs Lab 01/29/17 0210  WBC 15.4*  NEUTROABS 9.5*  HGB 13.7  HCT 40.5  MCV 92.9  PLT 243   Basic Metabolic  Panel:  Recent Labs Lab 01/24/17 1042 01/29/17 0210  NA 137 138  K 3.2* 3.7  CL 100* 101  CO2 29 23  GLUCOSE 143* 284*  BUN 15 15  CREATININE 0.83 1.43*  CALCIUM 8.6* 9.3   GFR: Estimated Creatinine Clearance: 90.4 mL/min (A) (by C-G formula based on SCr of 1.43 mg/dL (H)).  Liver Function Tests:  Recent Labs Lab 01/24/17 1042 01/29/17 0210  AST 29 54*  ALT 26 43  ALKPHOS 90 117  BILITOT 0.8 1.0  PROT 5.9* 7.1  ALBUMIN 3.7 4.2   No results for input(s): LIPASE, AMYLASE in the last 168 hours. No results for input(s): AMMONIA in the last 168 hours. Coagulation Profile: No results for input(s): INR, PROTIME in the last 168 hours. Cardiac Enzymes: No results for input(s): CKTOTAL, CKMB, CKMBINDEX, TROPONINI in the last 168 hours. BNP (last 3 results) No results for input(s): PROBNP in the last 8760 hours. HbA1C: No results for input(s): HGBA1C in the last 72 hours. CBG: No results for input(s): GLUCAP in the last 168 hours. Lipid Profile: No results for input(s): CHOL, HDL, LDLCALC, TRIG, CHOLHDL, LDLDIRECT in the last 72 hours. Thyroid Function Tests: No results for input(s): TSH, T4TOTAL, FREET4, T3FREE, THYROIDAB in the last 72 hours. Anemia Panel: No results for input(s): VITAMINB12, FOLATE, FERRITIN, TIBC, IRON, RETICCTPCT in the last 72 hours. Sepsis Labs:  Invalid input(s): PROCALCITONIN, LACTICIDVEN No results found for this or any previous visit (from the past 240 hour(s)).       Radiological Exams on Admission: Personally reviewed CXR shows bilateral edema: Dg Chest Port 1 View  Result Date: 01/29/2017 CLINICAL DATA:  Dyspnea EXAM: PORTABLE CHEST 1 VIEW COMPARISON:  10/27/2016 FINDINGS: Stable cardiomegaly. Diffuse bilateral airspace disease consistent with pulmonary edema. Superimposed pneumonia would be difficult to entirely exclude. Right atrial, coronary sinus and right ventricular leads are again noted with ICD device projecting over the left  axilla. No acute osseous abnormality. Trace fluid/ edema within the minor fissure on the right. IMPRESSION: Diffuse bilateral airspace opacities with cardiomegaly consistent with pulmonary edema. Superimposed pneumonia would be difficult to entirely exclude. Correlate clinically. Electronically Signed   By:  Tollie Eth M.D.   On: 01/29/2017 02:26    EKG: Independently reviewed. Rate 99, QRS 200, IVCD.  Paced.  Echocardiogram 4/10:  Report reviewed EF 15% Mild MR         Assessment/Plan  1. Acute on chronic systolic CHF:  Recent progressive dyspnea on exertion, now nocturnal dyspnea with large pulmonary edema.  Trigger seems to be excess fluid intake and salt intake today.  Dry weight 294 -Furosemide 80 mg BID -Strict I/Os -Daily weights -K suppl -Continue spironolactone -Continue Entresto -Cycle enzymes -Consult to HF team -Encouraged him in cessation of alcohol and tobacco   2. Acute kidney injury:  Congestive nephropathy. -Diurese and trend Cr  3. Alcohol dependence in early remission:  -SW consult for treatment referral if needed -CIWA protocol  4. Hyperglycemia:  HgBA1c last month 5.3%. -Accuchecks for now  5. Paroxysmal atrial fibrillation:  CHADS2Vasc 2.  On amiodarone and Xarelto -Continue Xarelto and amiodarone  6. Other medications:  -Continue PPI -Continue Paxil          DVT prophylaxis: N/A  Code Status: FULL  Family Communication: None present  Disposition Plan: Anticipate IV diuresis and close monitoring of I/Os, daily Cr Consults called: None overnight Admission status: INPATIENT         Medical decision making: Patient seen at 4:40 AM on 01/29/2017.  The patient was discussed with Dr. Blinda Leatherwood.  What exists of the patient's chart was reviewed in depth and summarized above.  Clinical condition: stable.        Alberteen Sam [facility] Pager [phone];

## 2017-01-29 NOTE — ED Triage Notes (Signed)
Pt here for sudden onset sob, hx CHF, sob, diaphoretic on arrival speaking in one word statements.

## 2017-01-29 NOTE — Progress Notes (Signed)
Pt states that he feels much better while using the NIV machine. Pt appears more comfortable no distress or complications noted at this time, tolerating the NIV machine well. RT will monitor patient status.

## 2017-01-29 NOTE — ED Notes (Signed)
Dr. Maryfrances Bunnell at bedside. Pt removed from Bipap per MD order

## 2017-01-29 NOTE — Progress Notes (Signed)
ANTICOAGULATION CONSULT NOTE - Initial Consult  Pharmacy Consult for Rivaroxaban Indication: atrial fibrillation  No Known Allergies  Patient Measurements: Height: 6\' 4"  (193 cm) IBW/kg (Calculated) : 86.8   Vital Signs: Temp: 98 F (36.7 C) (05/14 1138) Temp Source: Axillary (05/14 1138) BP: 108/74 (05/14 1138) Pulse Rate: 62 (05/14 1138)  Labs:  Recent Labs  01/29/17 0210 01/29/17 1101  HGB 13.7  --   HCT 40.5  --   PLT 243  --   CREATININE 1.43*  --   TROPONINI  --  0.06*    Estimated Creatinine Clearance: 90.4 mL/min (A) (by C-G formula based on SCr of 1.43 mg/dL (H)).   Medical History: Past Medical History:  Diagnosis Date  . CAD (coronary artery disease)    Mild nonobstructive (Cath 09)  . Chronic systolic heart failure (HCC)    Nonischemic CM: echo 4/12 with EF 25-30%, grade 2 diast dysfxn, mild dilated aortic root 43 mm, trivial MR, mod LAE  . COPD (chronic obstructive pulmonary disease) (HCC)   . Dyspnea   . H/O ETOH abuse   . HTN (hypertension)   . Hypersomnia   . Obesity, morbid (HCC)   . OSA on CPAP   . Systolic and diastolic CHF, acute on chronic (HCC) 11/2016  . Tobacco use disorder   . Ventricular tachycardia (HCC)      Assessment: 56yom with Hx HF admitted with SOB and volume overload.  Hx Afib on rivaroxaban pta for stroke prevention.  He admits to medication non-compliance with all meds including rivaroxaban taking about every other day.   Educated on appropriate dosing and reasoning why.    Goal of Therapy:   Monitor platelets by anticoagulation protocol: Yes   Plan:  Rivaroxaban 20mg  daily Monitor CBC, renal function Monitor s/s bleeding  Leota Sauers Pharm.D. CPP, BCPS Clinical Pharmacist [phone] 01/29/2017 2:48 PM

## 2017-01-29 NOTE — Progress Notes (Signed)
Came to assess patient, pt is off the BiPAP at this time. Appears to be much more comfortable at this time. No distress noted. MD Danford at bedside.

## 2017-01-29 NOTE — Consult Note (Signed)
Advanced Heart Failure Team Consult Note    Primary Cardiologist:  Dr. Shona Simpson. Shirlee Latch   Reason for Consultation: Acute on chronic systolic CHF  HPI:    [name] [name] is seen today for evaluation of acute on chronic systolic CHF at the request of Dr. Maryfrances Bunnell.   [name] [name] is a 56 y.o. male with history of chronic systolic CHF due to NICM (normal cors in July 2016), Echo 12/2016 EF 15%, s/p Medtronic BiV-ICD placement (July 2016), PAF on Xarelto with questionable compliance, ETOH abuse, tobacco abuse, and OSA with CPAP use.   He was admitted 12/25/16-12/27/16 with acute respiratory failure secondary to volume overload. He required Bipap. He was diuresed 13 pounds with IV lasix 80mg  BID, discharge weight was 294 pounds.   He was seen in the Advanced Heart Failure clinic on 01/24/17, weight was up to 309 pounds (weights at home 298- 302 pounds) so his torsemide was increased and his losartan was switched to Entresto 24/26mg  BID.  He presented to the ED on 01/29/17 with acute respiratory failure, O2 saturations in the 50's upon arrival. He was started on Bipap and given 80mg  IV lasix. His breathing is less labored now, remains on Bipap. He tells me that he started taking the [facility], had increased his torsemide as ordered. He has not been taking spironolactone as he ran out and the pharmacy is out of stock. He denies ETOH use, has been drinking more tea and water. Denies chest pain. Endorses orthopnea and PND. Pertinent admission labs - creatinine 1.43, BNP 557, WBC 15.4.    Review of Systems: [y] = yes, [ ]  = no   General: Weight gain [place] ]; Weight loss [ ] ; Anorexia [ ] ; Fatigue [ ] ; Fever [ ] ; Chills [ ] ; Weakness [ ]   Cardiac: Chest pain/pressure [ ] ; Resting SOB Cove.Etienne ]; Exertional SOB Cove.Etienne ]; Orthopnea [place].Etienne ]; Pedal Edema [ ] ; Palpitations [ ] ; Syncope [ ] ; Presyncope [ ] ; Paroxysmal nocturnal dyspnea[y ]  Pulmonary: Cough [ y]; Wheezing[ ] ; Hemoptysis[ ] ; Sputum [ ] ; Snoring [ ]   GI: Vomiting[  ]; Dysphagia[ ] ; Melena[ ] ; Hematochezia [ ] ; Heartburn[ ] ; Abdominal pain [ ] ; Constipation [ ] ; Diarrhea [ ] ; BRBPR [ ]   GU: Hematuria[ ] ; Dysuria [ ] ; Nocturia[ ]   Vascular: Pain in legs with walking [ ] ; Pain in feet with lying flat [ ] ; Non-healing sores [ ] ; Stroke [ ] ; TIA [ ] ; Slurred speech [ ] ;  Neuro: Headaches[ ] ; Vertigo[ ] ; Seizures[ ] ; Paresthesias[ ] ;Blurred vision [ ] ; Diplopia [ ] ; Vision changes [ ]   Ortho/Skin: Arthritis [place]ienne ]; Joint pain [ ] ; Muscle pain [ ] ; Joint swelling [ ] ; Back Pain [ ] ; Rash [ ]   Psych: Depression[ ] ; Anxiety[ ]   Heme: Bleeding problems [ ] ; Clotting disorders [ ] ; Anemia [ ]   Endocrine: Diabetes [ ] ; Thyroid dysfunction[ ]   Home Medications Prior to Admission medications   Medication Sig Start Date End Date Taking? Authorizing Provider  acetaminophen (TYLENOL) 325 MG tablet Take 2 tablets (650 mg total) by mouth every 4 (four) hours as needed for headache or mild pain. 04/15/15   [name], NP  albuterol (PROVENTIL HFA;VENTOLIN HFA) 108 (90 Base) MCG/ACT inhaler Inhale 1 puff into the lungs every 6 (six) hours as needed for wheezing or shortness of breath. 01/24/17   [name], NP  amiodarone (PACERONE) 200 MG tablet Take 1 tablet (200 mg total) by mouth daily. 12/28/16   [name], MD  aspirin 81 MG chewable tablet Chew 81 mg by mouth daily.    [provider]  atorvastatin (LIPITOR) 80 MG tablet Take 1 tablet (80 mg total) by mouth daily at 6 PM. 12/27/16   [name], MD  glucosamine-chondroitin 500-400 MG tablet Take 2 tablets by mouth daily.    [provider]  guaiFENesin (MUCINEX) 600 MG 12 hr tablet Take 600 mg by mouth 2 (two) times daily as needed for cough.    [provider]  pantoprazole (PROTONIX) 40 MG tablet Take 1 tablet (40 mg total) by mouth daily. 12/28/16   [name], MD  PARoxetine (PAXIL) 20 MG tablet Take 1 tablet (20 mg total) by mouth daily. 12/28/16   [name], MD  potassium  chloride 20 MEQ/15ML (10%) SOLN Take 30 mLs (40 mEq total) by mouth daily. 12/28/16   [name], MD  rivaroxaban (XARELTO) 20 MG TABS tablet Take 1 tablet (20 mg total) by mouth daily with supper. 12/27/16   [name], MD  sacubitril-valsartan (ENTRESTO) 24-26 MG Take 1 tablet by mouth 2 (two) times daily. 01/24/17   [name], NP  spironolactone (ALDACTONE) 25 MG tablet Take 1 tablet (25 mg total) by mouth daily. 12/28/16   [name], MD  torsemide (DEMADEX) 20 MG tablet Take 60 mg (3 tabs) in am and 40 mg (2 tabs) in pm 01/24/17   [name], NP    Past Medical History: Past Medical History:  Diagnosis Date  . CAD (coronary artery disease)    Mild nonobstructive (Cath 09)  . Chronic systolic heart failure (HCC)    Nonischemic CM: echo 4/12 with EF 25-30%, grade 2 diast dysfxn, mild dilated aortic root 43 mm, trivial MR, mod LAE  . COPD (chronic obstructive pulmonary disease) (HCC)   . Dyspnea   . H/O ETOH abuse   . HTN (hypertension)   . Hypersomnia   . Obesity, morbid (HCC)   . OSA on CPAP   . Systolic and diastolic CHF, acute on chronic (HCC) 11/2016  . Tobacco use disorder   . Ventricular tachycardia [facility])     Past Surgical History: Past Surgical History:  Procedure Laterality Date  . CARDIAC CATHETERIZATION N/A 04/08/2015   Procedure: Right/Left Heart Cath and Coronary Angiography;  Surgeon: Corky Crafts, MD;  Location: [facility] INVASIVE CV LAB;  Service: Cardiovascular;  Laterality: N/A;  . EP IMPLANTABLE DEVICE N/A 04/14/2015   Procedure: BiV ICD Insertion CRT-D;  Surgeon: [name], MD;  Location: [facility] INVASIVE CV LAB;  Service: Cardiovascular;  Laterality: N/A;  . HYDROCELE EXCISION / REPAIR    . TUMOR EXCISION Left   . 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 VASECTOMY      Family History: Family History  Problem Relation Age of Onset  . Other Mother        cardiac surgery. late 1990s  . Heart Problems Mother        CABG AGE  50  . Congestive Heart Failure Father   . Healthy Father        AGE 68  . Coronary artery disease Unknown   . Healthy Brother        AGE 57  . Healthy Sister        AGE 106  . Healthy Sister        AGE 31  . Healthy Son   . Healthy Son   . Healthy Daughter  Social History: Social History   Social History  . Marital status: Divorced    Spouse name: N/A  . Number of children: 2  . Years of education: N/A   Occupational History  . scrap metal   . disabled, odd jobs    Social History Main Topics  . Smoking status: Current Some Day Smoker    Packs/day: 0.50    Years: 4.00    Types: Cigarettes  . Smokeless tobacco: Never Used     Comment: about a pack a week. social  . Alcohol use 25.2 oz/week    42 Cans of beer per week     Comment: 6 beers per day  . Drug use: Yes    Frequency: 7.0 times per week    Types: Codeine, Marijuana     Comment: has discontinued  . Sexual activity: Yes    Partners: Female   Other Topics Concern  . None   Social History Narrative   ** Merged History Encounter **        Allergies:  No Known Allergies  Objective:    Vital Signs:   Temp:  [97.9 F (36.6 C)] 97.9 F (36.6 C) (05/14 0204) Pulse Rate:  [29-102] 73 (05/14 0615) Resp:  [17-59] 32 (05/14 0615) BP: (113-166)/(63-107) 113/77 (05/14 0615) SpO2:  [54 %-100 %] 100 % (05/14 0615) FiO2 (%):  [60 %-100 %] 60 % (05/14 0502)     Intake/Output:   Intake/Output Summary (Last 24 hours) at 01/29/17 0717 Last data filed at 01/29/17 0434  Gross per 24 hour  Intake                0 ml  Output              550 ml  Net             -550 ml     Physical Exam: General: Male, on Bipap, resting HEENT: normal Neck: supple. JVP to jaw . Carotids 2+ bilat; no bruits. No lymphadenopathy or thyromegaly appreciated. Cor: PMI laterally displaced. Regular rate & rhythm. No rubs, gallops or murmurs. Lungs: Crackles in bilateral upper lobes anteriorly. No wheeze. On Bipap.  Abdomen:  soft, nontender. Distended. No hepatosplenomegaly. No bruits or masses. Good bowel sounds. Extremities: no cyanosis, clubbing, rash. Trace pedal edema.  Neuro: alert & orientedx3, cranial nerves grossly intact. moves all 4 extremities w/o difficulty. Affect pleasant  Telemetry: V paced, rates in the 80's.   Labs: Basic Metabolic Panel:  Recent Labs Lab 01/24/17 1042 01/29/17 0210  NA 137 138  K 3.2* 3.7  CL 100* 101  CO2 29 23  GLUCOSE 143* 284*  BUN 15 15  CREATININE 0.83 1.43*  CALCIUM 8.6* 9.3    Liver Function Tests:  Recent Labs Lab 01/24/17 1042 01/29/17 0210  AST 29 54*  ALT 26 43  ALKPHOS 90 117  BILITOT 0.8 1.0  PROT 5.9* 7.1  ALBUMIN 3.7 4.2    CBC:  Recent Labs Lab 01/29/17 0210  WBC 15.4*  NEUTROABS 9.5*  HGB 13.7  HCT 40.5  MCV 92.9  PLT 243     BNP: BNP (last 3 results)  Recent Labs  12/25/16 0600 01/24/17 1042 01/29/17 0210  BNP 1,193.9* 436.3* 557.1*     Other results: EKG: V paced.   Imaging: Dg Chest Port 1 View  Result Date: 01/29/2017 CLINICAL DATA:  Dyspnea EXAM: PORTABLE CHEST 1 VIEW COMPARISON:  10/27/2016 FINDINGS: Stable cardiomegaly. Diffuse bilateral airspace disease consistent with pulmonary  edema. Superimposed pneumonia would be difficult to entirely exclude. Right atrial, coronary sinus and right ventricular leads are again noted with ICD device projecting over the left axilla. No acute osseous abnormality. Trace fluid/ edema within the minor fissure on the right. IMPRESSION: Diffuse bilateral airspace opacities with cardiomegaly consistent with pulmonary edema. Superimposed pneumonia would be difficult to entirely exclude. Correlate clinically. Electronically Signed   By: Tollie Eth M.D.   On: 01/29/2017 02:26      Medications:     Current Medications:    Infusions:     Assessment/Plan   1. Acute on chronic systolic CHF: NICM, normal cors in July 2016, likely due to OSA, heavy ETOH abuse. EF 15% in  April 2018.  - Volume overloaded on exam, has gotten one dose of IV lasix 80mg  with out.  - Start IV lasix 80 mg BID   - continue Entresto  - No beta blocker for now, he has not been on one outpatient.  - Continue Spiro 25mg  daily 2. PAF - Continue Amiodarone  - Will have Medtronic rep interrogate pacer for % pacing and Afib burden.  - Continue Xarelto for CHA2DS2/VASc at least 3. 3. Acute renal insuffiencey:  - Creatinine 0.83 5 days ago in clinic - Creatinine 1.43 today in the setting of volume overload, BUN normal.  - Daily BMET.  4. Leukocytosis - ? PNA on chest x ray.  - Patient is afebrile. Will follow closely.  5. OSA  - Says he is compliant with CPAP.  6. ETOH abuse - He says he has not drank any alcohol since 01/24/17.  - Encouraged continued cessation.  7. Substance abuse - Daily TSH use - Encouraged cessation.  8. Acute respiratory failure - Improving with Bipap and diuresis    Length of Stay: 0  [name], NP  01/29/2017, 7:17 AM Advanced Heart Failure Team  Pager [phone] M-F 7am-4pm.  Please contact [facility] for night-coverage after hours (4p -7a ) and weekends on [email or web address]   Patient seen and examined with [name], NP. We discussed all aspects of the encounter. I agree with the assessment and plan as stated above.   56 y/o male as above with severe NICM. EF 15%. Now admitted with volume overload and respiratory failure. Improving with Bipap and IV diuresis. BNP is not as high as I would expect. Weight up 15 pounds from baseline however. Will continue IV diuresis. Interrogated ICD and see if we can optimize CRT. Watch renal function closely. No need for inotropes currently but at high-risk for low output.   Arvilla Meres, MD  8:20 AM

## 2017-01-29 NOTE — ED Provider Notes (Signed)
[facility]-EMERGENCY DEPT Provider Note   CSN: [id number] Arrival date & time: 01/29/17  0159  By signing my name below, I, [name] [name], attest that this documentation has been prepared under the direction and in the presence of [name], [name], MD. Electronically Signed: [name], Scribe. 01/29/17. 2:06 AM.   History   Chief Complaint Chief Complaint  Patient presents with  . Congestive Heart Failure  . Respiratory Distress   LEVEL 5 CAVEAT DUE TO HIGH ACUITY  HPI [name] [name] is a 56 y.o. male with history of systolic HF, COPD, CAD, HTN, and morbid obesity who presents to the ED in respiratory distress. The patient states that 1 hour ago he had sudden onset of dyspnea; arrived here by private vehicle. He is hypoxic in the 50's in triage on room air.   A complete history is limited by HIGH ACUITY.  The history is provided by the EMS personnel and the patient. The history is limited by the condition of the patient.    Past Medical History:  Diagnosis Date  . CAD (coronary artery disease)    Mild nonobstructive (Cath 09)  . Chronic systolic heart failure (HCC)    Nonischemic CM: echo 4/12 with EF 25-30%, grade 2 diast dysfxn, mild dilated aortic root 43 mm, trivial MR, mod LAE  . COPD (chronic obstructive pulmonary disease) (HCC)   . Dyspnea   . H/O ETOH abuse   . HTN (hypertension)   . Hypersomnia   . Obesity, morbid (HCC)   . OSA on CPAP   . Systolic and diastolic CHF, acute on chronic (HCC) 11/2016  . Tobacco use disorder   . Ventricular tachycardia [facility])     Patient Active Problem List   Diagnosis Date Noted  . Acute combined systolic and diastolic heart failure (HCC) 12/27/2016  . Paroxysmal atrial fibrillation (HCC) 12/25/2016  . HTN (hypertension) 12/25/2016  . Obesity 12/25/2016  . Tobacco abuse 12/25/2016  . Alcohol abuse 12/25/2016  . OSA (obstructive sleep apnea) 12/25/2016  . CAD (coronary artery disease) 12/25/2016  . Dilated  cardiomyopathy (HCC) 12/25/2016  . ICD (implantable cardioverter-defibrillator) in place 12/25/2016  . Hyperglycemia 12/25/2016  . Pulmonary edema 12/25/2016  . Acute respiratory failure with hypoxia and hypercarbia (HCC) 12/25/2016  . Chronic anticoagulation 06/27/2016  . Encounter for therapeutic drug monitoring 04/19/2015  . S/P ICD (internal cardiac defibrillator) procedure Medtronic CRT ICD 04/15/2015  . Atrial fibrillation with RVR - Paroxysmal 04/08/2015  . Acute on chronic systolic heart failure (HCC) 04/06/2015  . NICM- EF 25% 2012 & 20-25% 2016 04/06/2015  . Troponin level elevated 04/06/2015  . Rectal bleeding 10/26/2011  . Hydrocele, left 05/24/2011  . Tobacco use disorder- 1/2 pk a day   . H/O ETOH abuse 6-8 beers a day   . Obesity 09/27/2008  . CAD- minor CAD 2009 and 2016 09/27/2008  . VENTRICULAR TACHYCARDIA 09/27/2008  . DYSPNEA 09/27/2008  . Essential hypertension 06/19/2008  . Chronic systolic heart failure (HCC) 06/19/2008  . HYPERSOMNIA, ASSOCIATED WITH SLEEP APNEA 06/19/2008  . SLEEP APNEA 06/19/2008    Past Surgical History:  Procedure Laterality Date  . CARDIAC CATHETERIZATION N/A 04/08/2015   Procedure: Right/Left Heart Cath and Coronary Angiography;  Surgeon: Corky Crafts, MD;  Location: [facility] INVASIVE CV LAB;  Service: Cardiovascular;  Laterality: N/A;  . EP IMPLANTABLE DEVICE N/A 04/14/2015   Procedure: BiV ICD Insertion CRT-D;  Surgeon: [name], MD;  Location: [facility] INVASIVE CV LAB;  Service: Cardiovascular;  Laterality: N/A;  .  HYDROCELE EXCISION / REPAIR    . TUMOR EXCISION Left   . 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 VASECTOMY         Home Medications    Prior to Admission medications   Medication Sig Start Date End Date Taking? Authorizing Provider  acetaminophen (TYLENOL) 325 MG tablet Take 2 tablets (650 mg total) by mouth every 4 (four) hours as needed for headache or mild pain. 04/15/15   [name], NP  albuterol (PROVENTIL HFA;VENTOLIN HFA) 108 (90 Base) MCG/ACT inhaler Inhale 1 puff into the lungs every 6 (six) hours as needed for wheezing or shortness of breath. 01/24/17   [name], NP  amiodarone (PACERONE) 200 MG tablet Take 1 tablet (200 mg total) by mouth daily. 12/28/16   [name], MD  aspirin 81 MG chewable tablet Chew 81 mg by mouth daily.    [provider]  atorvastatin (LIPITOR) 80 MG tablet Take 1 tablet (80 mg total) by mouth daily at 6 PM. 12/27/16   [name], MD  glucosamine-chondroitin 500-400 MG tablet Take 2 tablets by mouth daily.    [provider]  guaiFENesin (MUCINEX) 600 MG 12 hr tablet Take 600 mg by mouth 2 (two) times daily as needed for cough.    [provider]  pantoprazole (PROTONIX) 40 MG tablet Take 1 tablet (40 mg total) by mouth daily. 12/28/16   [name], MD  PARoxetine (PAXIL) 20 MG tablet Take 1 tablet (20 mg total) by mouth daily. 12/28/16   [name], MD  potassium chloride 20 MEQ/15ML (10%) SOLN Take 30 mLs (40 mEq total) by mouth daily. 12/28/16   [name], MD  rivaroxaban (XARELTO) 20 MG TABS tablet Take 1 tablet (20 mg total) by mouth daily with supper. 12/27/16   [name], MD  sacubitril-valsartan (ENTRESTO) 24-26 MG Take 1 tablet by mouth 2 (two) times daily. 01/24/17   [name], NP  spironolactone (ALDACTONE) 25 MG tablet Take 1 tablet (25 mg total) by mouth daily. 12/28/16   [name], MD  torsemide (DEMADEX) 20 MG tablet Take 60 mg (3 tabs) in am and 40 mg (2 tabs) in pm 01/24/17   [name], NP    Family History Family History  Problem Relation Age of Onset  . Other Mother        cardiac surgery. late 1990s  . Heart Problems Mother        CABG AGE 65  . Congestive Heart Failure Father   . Healthy Father        AGE 64  . Coronary artery disease Unknown   . Healthy Brother        AGE 82  . Healthy Sister        AGE 36  . Healthy Sister        AGE 86  . Healthy Son     . Healthy Son   . Healthy Daughter     Social History Social History  Substance Use Topics  . Smoking status: Current Some Day Smoker    Packs/day: 0.50    Years: 4.00    Types: Cigarettes  . Smokeless tobacco: Never Used     Comment: about a pack a week. social  . Alcohol use 25.2 oz/week    42 Cans of beer per week     Comment: 6 beers per day     Allergies   Patient has no  known allergies.   Review of Systems Review of Systems  Unable to perform ROS: Severe respiratory distress     Physical Exam Updated Vital Signs BP 133/63   Pulse (!) 29   Temp 97.9 F (36.6 C) (Oral)   Resp (!) 25   SpO2 100%   Physical Exam  Constitutional: He is oriented to person, place, and time. He appears well-developed and well-nourished. He appears distressed.  HENT:  Head: Normocephalic and atraumatic.  Right Ear: Hearing normal.  Left Ear: Hearing normal.  Nose: Nose normal.  Mouth/Throat: Oropharynx is clear and moist and mucous membranes are normal.  Eyes: Conjunctivae and EOM are normal. Pupils are equal, round, and reactive to light.  Neck: Normal range of motion. Neck supple.  Cardiovascular: Regular rhythm, S1 normal and S2 normal.  Tachycardia present.  Exam reveals no gallop and no friction rub.   No murmur heard. Pulmonary/Chest: Accessory muscle usage present. Tachypnea noted. He is in respiratory distress. He exhibits no tenderness.  Diminished breath sounds bilaterally with crackles in the bases.  Abdominal: Soft. Normal appearance and bowel sounds are normal. There is no hepatosplenomegaly. There is no tenderness. There is no rebound, no guarding, no tenderness at McBurney's point and negative Murphy's sign. No hernia.  Musculoskeletal: Normal range of motion.  Neurological: He is alert and oriented to person, place, and time. He has normal strength. No cranial nerve deficit or sensory deficit. Coordination normal. GCS eye subscore is 4. GCS verbal subscore is 5.  GCS motor subscore is 6.  Skin: Skin is warm and intact. No rash noted. He is diaphoretic. No cyanosis.  Psychiatric: He has a normal mood and affect. His speech is normal and behavior is normal. Thought content normal.  Nursing note and vitals reviewed.    ED Treatments / Results  Labs (all labs ordered are listed, but only abnormal results are displayed) Labs Reviewed  CBC WITH DIFFERENTIAL/PLATELET - Abnormal; Notable for the following:       Result Value   WBC 15.4 (*)    Neutro Abs 9.5 (*)    Lymphs Abs 5.0 (*)    All other components within normal limits  COMPREHENSIVE METABOLIC PANEL - Abnormal; Notable for the following:    Glucose, Bld 284 (*)    Creatinine, Ser 1.43 (*)    AST 54 (*)    GFR calc non Af Amer 54 (*)    All other components within normal limits  BRAIN NATRIURETIC PEPTIDE  I-STAT TROPOININ, ED    EKG  EKG Interpretation  Date/Time:  Monday Jan 29 2017 02:07:13 EDT Ventricular Rate:  99 PR Interval:    QRS Duration: 200 QT Interval:  432 QTC Calculation: 555 R Axis:   -103 Text Interpretation:  Sinus rhythm vs atrial sensed ventricular paced rhythm Probable left atrial enlargement Nonspecific IVCD with LAD Confirmed by Blinda Leatherwood  MD, Gigi Onstad [phone]) on 01/29/2017 2:57:46 AM       Radiology Dg Chest Port 1 View  Result Date: 01/29/2017 CLINICAL DATA:  Dyspnea EXAM: PORTABLE CHEST 1 VIEW COMPARISON:  10/27/2016 FINDINGS: Stable cardiomegaly. Diffuse bilateral airspace disease consistent with pulmonary edema. Superimposed pneumonia would be difficult to entirely exclude. Right atrial, coronary sinus and right ventricular leads are again noted with ICD device projecting over the left axilla. No acute osseous abnormality. Trace fluid/ edema within the minor fissure on the right. IMPRESSION: Diffuse bilateral airspace opacities with cardiomegaly consistent with pulmonary edema. Superimposed pneumonia would be difficult to entirely exclude. Correlate  clinically. Electronically Signed   By: Tollie Eth M.D.   On: 01/29/2017 02:26    Procedures Procedures (including critical care time)  Medications Ordered in ED Medications  furosemide (LASIX) injection 80 mg (80 mg Intravenous Given 01/29/17 0214)     Initial Impression / Assessment and Plan / ED Course  I have reviewed the triage vital signs and the nursing notes.  Pertinent labs & imaging results that were available during my care of the patient were reviewed by me and considered in my medical decision making (see chart for details).     Patient presents to the emergency department for evaluation of sudden onset of severe shortness of breath. Patient arrived in severe distress. He was in the tripod position, tachypnea with severely increased work of breathing. Patient has a history of systolic heart failure. Chest x-ray is consistent with pulmonary edema.  Patient was placed on BiPAP at arrival with significant improvement of his breathing. He was given Lasix 80 mg IV. Blood pressure was initially somewhat elevated, has become normotensive with above mentioned treatment. Patient is not experiencing any chest pain. EKG is not diagnostic. Troponin negative.  CRITICAL CARE Performed by: Gilda Crease   Total critical care time: 30 minutes  Critical care time was exclusive of separately billable procedures and treating other patients.  Critical care was necessary to treat or prevent imminent or life-threatening deterioration.  Critical care was time spent personally by me on the following activities: development of treatment plan with patient and/or surrogate as well as nursing, discussions with consultants, evaluation of patient's response to treatment, examination of patient, obtaining history from patient or surrogate, ordering and performing treatments and interventions, ordering and review of laboratory studies, ordering and review of radiographic studies, pulse  oximetry and re-evaluation of patient's condition.   Final Clinical Impressions(s) / ED Diagnoses   Final diagnoses:  Acute respiratory failure with hypoxia (HCC)  Acute pulmonary edema (HCC)    New Prescriptions New Prescriptions   No medications on file  I personally performed the services described in this documentation, which was scribed in my presence. The recorded information has been reviewed and is accurate.    Gilda Crease, MD 01/29/17 [phone]

## 2017-01-29 NOTE — Discharge Instructions (Signed)

## 2017-01-29 NOTE — Progress Notes (Signed)
CRITICAL VALUE ALERT  Critical value received:  Troponin 0.06  Date of notification:  01/29/2017  Time of notification:  1302  Critical value read back:Yes.    Nurse who received alert:  Rhae Lerner RN  MD notified (1st page):  Dr. Susie Cassette  Time of first page:  1305  MD notified (2nd page):  Time of second page:  Responding MD:  Dr. Susie Cassette  Time MD responded:  1310  Primary RN Herbert Seta made aware aslo.  Colman Cater

## 2017-01-29 NOTE — Progress Notes (Signed)
Pt placed back on BiPAP per MD Danford request. Pt appears to be comfortable. No distress noted.  Pt states that he wears a CPAP at home. RN aware

## 2017-01-29 NOTE — ED Notes (Signed)
Pt appears much more comfortable at this time, pt no longer diaphoretic, pt stating he feels much better with bipap in place

## 2017-01-29 NOTE — Progress Notes (Addendum)
Patient seen and examined [name] [name] a 56 y.o.malewith history of chronic systolic CHF due to NICM (normal cors in July 2016), Echo 12/2016 EF 15%, s/p Medtronic BiV-ICD placement (July 2016), PAF on Xarelto with questionable compliance, ETOH abuse, tobacco abuse, and OSA with CPAP use. He presented to the ED on 01/29/17 with acute respiratory failure, O2 saturations in the 50's upon arrival. He was started on Bipap and given 80mg  IV lasix  Plan CHF exacerbation, doubt pneumonia Continue diuretics per cardiology Follow renal function closely No need for inotropes currently Discussed with significant other at the bedside

## 2017-01-30 DIAGNOSIS — N179 Acute kidney failure, unspecified: Secondary | ICD-10-CM

## 2017-01-30 LAB — COMPREHENSIVE METABOLIC PANEL
ALT: 29 U/L (ref 17–63)
AST: 26 U/L (ref 15–41)
Albumin: 3.5 g/dL (ref 3.5–5.0)
Alkaline Phosphatase: 87 U/L (ref 38–126)
Anion gap: 7 (ref 5–15)
BILIRUBIN TOTAL: 0.8 mg/dL (ref 0.3–1.2)
BUN: 15 mg/dL (ref 6–20)
CALCIUM: 8.8 mg/dL — AB (ref 8.9–10.3)
CO2: 32 mmol/L (ref 22–32)
CREATININE: 0.86 mg/dL (ref 0.61–1.24)
Chloride: 102 mmol/L (ref 101–111)
GFR calc non Af Amer: >60 mL/min (ref >60)
Glucose, Bld: 140 mg/dL — ABNORMAL HIGH (ref 65–99)
Potassium: 3 mmol/L — ABNORMAL LOW (ref 3.5–5.1)
SODIUM: 141 mmol/L (ref 135–145)
TOTAL PROTEIN: 6 g/dL — AB (ref 6.5–8.1)

## 2017-01-30 LAB — GLUCOSE, CAPILLARY
GLUCOSE-CAPILLARY: 83 mg/dL (ref 65–99)
Glucose-Capillary: 115 mg/dL — ABNORMAL HIGH (ref 65–99)
Glucose-Capillary: 145 mg/dL — ABNORMAL HIGH (ref 65–99)

## 2017-01-30 LAB — CBC
HCT: 33.5 % — ABNORMAL LOW (ref 39.0–52.0)
Hemoglobin: 11.2 g/dL — ABNORMAL LOW (ref 13.0–17.0)
MCH: 31.1 pg (ref 26.0–34.0)
MCHC: 33.4 g/dL (ref 30.0–36.0)
MCV: 93.1 fL (ref 78.0–100.0)
Platelets: 137 10*3/uL — ABNORMAL LOW (ref 150–400)
RBC: 3.6 MIL/uL — AB (ref 4.22–5.81)
RDW: 14.2 % (ref 11.5–15.5)
WBC: 4.9 10*3/uL (ref 4.0–10.5)

## 2017-01-30 MED ORDER — POTASSIUM CHLORIDE 20 MEQ/15ML (10%) PO SOLN
40.0000 meq | Freq: Three times a day (TID) | ORAL | Status: DC
  Administered 2017-01-30 – 2017-01-31 (×4): 40 meq via ORAL
  Filled 2017-01-30 (×3): qty 30

## 2017-01-30 MED ORDER — POTASSIUM CHLORIDE 20 MEQ/15ML (10%) PO SOLN: 40.0000 meq | Freq: Three times a day (TID) | ORAL | Status: DC

## 2017-01-30 NOTE — Progress Notes (Signed)
Advanced Heart Failure Rounding Note   Primary Cardiologist: Dr. Shona Simpson. Shirlee Latch   Subjective:    Admitted with acute on chronic CHF, acute respiratory failure.   - 2.7 L, no weight recorded yesterday to compare today's to. No longer requiring Bipap.   Feels better today, denies orthopnea and PND. Denies chest pain.     Objective:   Weight Range: (!) 301 lb 14.4 oz (136.9 kg) Body mass index is 36.75 kg/m.   Vital Signs:   Temp:  [97.1 F (36.2 C)-98.8 F (37.1 C)] 98.8 F (37.1 C) (05/15 0800) Pulse Rate:  [62-72] 71 (05/15 0800) Resp:  [12-29] 29 (05/15 0800) BP: (92-120)/(53-74) 109/72 (05/15 0800) SpO2:  [95 %-100 %] 97 % (05/15 0800) FiO2 (%):  [40 %] 40 % (05/15 0354) Weight:  [301 lb 14.4 oz (136.9 kg)] 301 lb 14.4 oz (136.9 kg) (05/15 0330) Last BM Date: 01/30/17  Weight change: Filed Weights   01/30/17 0330  Weight: (!) 301 lb 14.4 oz (136.9 kg)    Intake/Output:   Intake/Output Summary (Last 24 hours) at 01/30/17 0841 Last data filed at 01/30/17 0829  Gross per 24 hour  Intake              880 ml  Output             3110 ml  Net            -2230 ml     Physical Exam: General:  Obese male, NAD. Lying in bed.  HEENT: normal Neck: supple. 7-8 cm JVD . Carotids 2+ bilat; no bruits. No lymphadenopathy or thyromegaly appreciated. Cor: PMI laterally displaced. Regular rate & rhythm. No rubs, gallops or murmurs. Lungs: Clear in upper lobes bilaterally, crackles in bilateral lower lobes.  Abdomen: soft, nontender, + distended. No hepatosplenomegaly. No bruits or masses. Good bowel sounds. Extremities: no cyanosis, clubbing, rash. Trace pedal edema bilaterally.  Neuro: alert & orientedx3, cranial nerves grossly intact. moves all 4 extremities w/o difficulty. Affect pleasant   Telemetry: V paced   Labs: CBC  Recent Labs  01/29/17 0210 01/30/17 0443  WBC 15.4* 4.9  NEUTROABS 9.5*  --   HGB 13.7 11.2*  HCT 40.5 33.5*  MCV 92.9 93.1    PLT 243 137*   Basic Metabolic Panel  Recent Labs  01/29/17 0210 01/30/17 0443  NA 138 141  K 3.7 3.0*  CL 101 102  CO2 23 32  GLUCOSE 284* 140*  BUN 15 15  CREATININE 1.43* 0.86  CALCIUM 9.3 8.8*   Liver Function Tests  Recent Labs  01/29/17 0210 01/30/17 0443  AST 54* 26  ALT 43 29  ALKPHOS 117 87  BILITOT 1.0 0.8  PROT 7.1 6.0*  ALBUMIN 4.2 3.5   No results for input(s): LIPASE, AMYLASE in the last 72 hours. Cardiac Enzymes  Recent Labs  01/29/17 1101 01/29/17 1407  TROPONINI 0.06* 0.06*    BNP: BNP (last 3 results)  Recent Labs  12/25/16 0600 01/24/17 1042 01/29/17 0210  BNP 1,193.9* 436.3* 557.1*     Imaging/Studies: Transthoracic Echocardiography 12/26/16  Study Conclusions  - Left ventricle: The cavity size was moderately dilated. Systolic   function was severely reduced. The estimated ejection fraction   was 15%. Diffuse hypokinesis. Doppler parameters are consistent   with restrictive physiology, indicative of decreased left   ventricular diastolic compliance and/or increased left atrial   pressure. - Aortic root: The aortic root was mildly dilated. - Mitral valve: Calcified annulus.  There was mild regurgitation. - Left atrium: The atrium was severely dilated. - Pericardium, extracardiac: A trivial pericardial effusion was   identified.  Impressions:  - Technically difficult; severe global reduction in LV systolic   function; restrictive filling; mildly dilated aortic root; mild   MR; severe LAE.   Medications:     Scheduled Medications: . amiodarone  200 mg Oral Daily  . aspirin  81 mg Oral Daily  . atorvastatin  80 mg Oral q1800  . folic acid  1 mg Oral Daily  . furosemide  80 mg Intravenous BID  . multivitamin with minerals  1 tablet Oral Daily  . pantoprazole  40 mg Oral Daily  . PARoxetine  20 mg Oral Daily  . pneumococcal 23 valent vaccine  0.5 mL Intramuscular Tomorrow-1000  . potassium chloride  40 mEq Oral  Daily  . rivaroxaban  20 mg Oral Q supper  . sacubitril-valsartan  1 tablet Oral BID  . spironolactone  25 mg Oral Daily  . thiamine  100 mg Oral Daily   Or  . thiamine  100 mg Intravenous Daily     Infusions:   PRN Medications:  acetaminophen **OR** acetaminophen, LORazepam **OR** LORazepam   Assessment/Plan   1. Acute on chronic systolic CHF: NICM, normal cors in July 2016, likely due to OSA, heavy ETOH abuse. EF 15% in April 2018.  - Still volume overloaded on exam, continue IV lasix today. Will supplement K - continue Entresto  - No beta blocker until volume status improves.   - Continue Spiro 25mg  daily 2. PAF - Continue Amiodarone  - Device interrogated - no afib noted.  BiV pacing 96%.  - Continue Xarelto for CHA2DS2/VASc at least 3. 3. Acute renal insuffiencey:  - Creatinine improved with diuresis.  4. Leukocytosis - Resolved.  5. OSA  - Compliant with CPAP.  6. ETOH abuse - He says he has not drank any alcohol since 01/24/17.  - Talked today about the effect of ETOH on his heart.  7. Substance abuse - Daily THC use.  - Encouraged cessation   8. Acute respiratory failure - Improved.     Length of Stay: 1   [name], NP  01/30/2017, 8:41 AM [name], NP-C Advanced Heart Failure Team  Pager [phone] M-F 7am-4pm.  Please contact [facility] for night-coverage after hours (4p -7a ) and weekends on [email or web address]  Patient seen and examined with [name], NP. We discussed all aspects of the encounter. I agree with the assessment and plan as stated above.   Volume status improving but still overloaded. Respiratory status much improved. Renal function improving. Will continue IV diuresis for 1-2 more days. Watch renal function and electrolytes closely.   Arvilla Meres, MD  10:37 PM

## 2017-01-30 NOTE — Plan of Care (Signed)
Problem: Education: Goal: Knowledge of [facility] Education information/materials will improve Outcome: Progressing Educated on importance of BiPAP.   Problem: Fluid Volume: Goal: Ability to maintain a balanced intake and output will improve Outcome: Progressing Receiving diuretics, advised to use urinal for accurate I's and O's.

## 2017-01-30 NOTE — Progress Notes (Addendum)
[facility] PROGRESS NOTE  [name] [name] ZOX:[id number] DOB: 31-Aug-1961 DOA: 01/29/2017   PCP: [name], MD     Assessment/Plan: Principal Problem:   Acute on chronic systolic CHF (congestive heart failure) (HCC) Active Problems:   Essential hypertension   NICM- EF 25% 2012 & 20-25% 2016   Atrial fibrillation with RVR - Paroxysmal   OSA (obstructive sleep apnea)   Hyperglycemia   56 y.o.malewith history of chronic systolic CHF due to NICM (normal cors in July 2016), Echo 12/2016 EF 15%, s/p Medtronic BiV-ICD placement (July 2016), PAF on Xarelto with questionable compliance, ETOH abuse, tobacco abuse, and OSA with CPAP use. He presented to the ED on 01/29/17 with acute respiratory failure, O2 saturations in the 50's upon arrival. He was started on Bipap and given 80mg  IV lasix,cards consulted    Assessment/Plan 1. Acute on chronic systolic CHF: NICM, normal coronary in July 2016, likely due to OSA, heavy ETOH abuse. EF 15% in April 2018.  Recent progressive dyspnea on exertion, now nocturnal dyspnea with large pulmonary edema.  Trigger seems to be excess fluid intake and salt intake today.  Dry weight 294 continue IV lasix today.   - continueEntresto  -K suppl -Continue spironolactone -Cycle enzymes flat No beta blocker until volume status improves -Encouraged him in cessation of alcohol and tobacco   2. Acute kidney injury:  Congestive nephropathy. Creatinine improved with diuresis  3. Alcohol dependence in early remission:  -SW consult for treatment referral if needed -CIWA protocol  4. Hyperglycemia:  HgBA1c last month 5.3%. -Accuchecks for now  5. PAF - Continue Amiodarone  - Device interrogated - no afib noted.  BiV pacing 96%.  - Continue Xarelto for CHA2DS2/VASc at least 3.  6. Other medications:  -Continue PPI -Continue Paxil  7. OSA  - Compliant with CPAP     DVT prophylaxsis xarelto  Code Status:  Full code    Family  Communication: Discussed in detail with the patient, all imaging results, lab results explained to the patient   Disposition Plan:  Per cards      Consultants:  CHF   Procedures:  None   Antibiotics: Anti-infectives    None         HPI/Subjective:  Feels better today, denies orthopnea and PND. Denies chest pain.   Objective: Vitals:   01/30/17 0330 01/30/17 0354 01/30/17 0800 01/30/17 1125  BP: (!) 102/53  109/72 106/60  Pulse:   71 64  Resp: 14  (!) 29 17  Temp: 98.8 F (37.1 C)  98.8 F (37.1 C) 98.4 F (36.9 C)  TempSrc: Oral  Oral Oral  SpO2: 96% 100% 97% 97%  Weight: (!) 136.9 kg (301 lb 14.4 oz)     Height: 6\' 4"  (1.93 m)       Intake/Output Summary (Last 24 hours) at 01/30/17 1245 Last data filed at 01/30/17 0957  Gross per 24 hour  Intake              640 ml  Output             2150 ml  Net            -1510 ml    Exam:  Examination:  General exam: Appears calm and comfortable  Respiratory system: Clear to auscultation. Respiratory effort normal. Cardiovascular system: S1 & S2 heard, RRR. No JVD, murmurs, rubs, gallops or clicks. No pedal edema. Gastrointestinal system: Abdomen is nondistended, soft and nontender. No organomegaly  or masses felt. Normal bowel sounds heard. Central nervous system: Alert and oriented. No focal neurological deficits. Extremities: Symmetric 5 x 5 power. Skin: No rashes, lesions or ulcers Psychiatry: Judgement and insight appear normal. Mood & affect appropriate.     Data Reviewed: I have personally reviewed following labs and imaging studies  Micro Results Recent Results (from the past 240 hour(s))  MRSA PCR Screening     Status: None   Collection Time: 01/29/17  9:23 AM  Result Value Ref Range Status   MRSA by PCR NEGATIVE NEGATIVE Final    Comment:        The GeneXpert MRSA Assay (FDA approved for NASAL specimens only), is one component of a comprehensive MRSA colonization surveillance program. It is  not intended to diagnose MRSA infection nor to guide or monitor treatment for MRSA infections.     Radiology Reports Dg Chest Port 1 View  Result Date: 01/29/2017 CLINICAL DATA:  Dyspnea EXAM: PORTABLE CHEST 1 VIEW COMPARISON:  10/27/2016 FINDINGS: Stable cardiomegaly. Diffuse bilateral airspace disease consistent with pulmonary edema. Superimposed pneumonia would be difficult to entirely exclude. Right atrial, coronary sinus and right ventricular leads are again noted with ICD device projecting over the left axilla. No acute osseous abnormality. Trace fluid/ edema within the minor fissure on the right. IMPRESSION: Diffuse bilateral airspace opacities with cardiomegaly consistent with pulmonary edema. Superimposed pneumonia would be difficult to entirely exclude. Correlate clinically. Electronically Signed   By: Tollie Eth M.D.   On: 01/29/2017 02:26     CBC  Recent Labs Lab 01/29/17 0210 01/30/17 0443  WBC 15.4* 4.9  HGB 13.7 11.2*  HCT 40.5 33.5*  PLT 243 137*  MCV 92.9 93.1  MCH 31.4 31.1  MCHC 33.8 33.4  RDW 14.3 14.2  LYMPHSABS 5.0*  --   MONOABS 0.6  --   EOSABS 0.3  --   BASOSABS 0.0  --     Chemistries   Recent Labs Lab 01/24/17 1042 01/29/17 0210 01/30/17 0443  NA 137 138 141  K 3.2* 3.7 3.0*  CL 100* 101 102  CO2 29 23 32  GLUCOSE 143* 284* 140*  BUN 15 15 15   CREATININE 0.83 1.43* 0.86  CALCIUM 8.6* 9.3 8.8*  AST 29 54* 26  ALT 26 43 29  ALKPHOS 90 117 87  BILITOT 0.8 1.0 0.8   ------------------------------------------------------------------------------------------------------------------ estimated creatinine clearance is 148.3 mL/min (by C-G formula based on SCr of 0.86 mg/dL). ------------------------------------------------------------------------------------------------------------------ No results for input(s): HGBA1C in the last 72  hours. ------------------------------------------------------------------------------------------------------------------ No results for input(s): CHOL, HDL, LDLCALC, TRIG, CHOLHDL, LDLDIRECT in the last 72 hours. ------------------------------------------------------------------------------------------------------------------ No results for input(s): TSH, T4TOTAL, T3FREE, THYROIDAB in the last 72 hours.  Invalid input(s): FREET3 ------------------------------------------------------------------------------------------------------------------ No results for input(s): VITAMINB12, FOLATE, FERRITIN, TIBC, IRON, RETICCTPCT in the last 72 hours.  Coagulation profile No results for input(s): INR, PROTIME in the last 168 hours.  No results for input(s): DDIMER in the last 72 hours.  Cardiac Enzymes  Recent Labs Lab 01/29/17 1101 01/29/17 1407  TROPONINI 0.06* 0.06*   ------------------------------------------------------------------------------------------------------------------ Invalid input(s): POCBNP   CBG:  Recent Labs Lab 01/29/17 1122 01/29/17 1601 01/29/17 2048 01/30/17 0752 01/30/17 1124  GLUCAP 188* 132* 127* 115* 145*       Studies: Dg Chest Port 1 View  Result Date: 01/29/2017 CLINICAL DATA:  Dyspnea EXAM: PORTABLE CHEST 1 VIEW COMPARISON:  10/27/2016 FINDINGS: Stable cardiomegaly. Diffuse bilateral airspace disease consistent with pulmonary edema. Superimposed pneumonia would be difficult to entirely exclude.  Right atrial, coronary sinus and right ventricular leads are again noted with ICD device projecting over the left axilla. No acute osseous abnormality. Trace fluid/ edema within the minor fissure on the right. IMPRESSION: Diffuse bilateral airspace opacities with cardiomegaly consistent with pulmonary edema. Superimposed pneumonia would be difficult to entirely exclude. Correlate clinically. Electronically Signed   By: Tollie Eth M.D.   On: 01/29/2017 02:26       Lab Results  Component Value Date   HGBA1C 5.2 12/25/2016   HGBA1C 5.3 04/06/2015   HGBA1C  12/10/2007    5.6 (NOTE)   The ADA recommends the following therapeutic goals for glycemic   control related to Hgb A1C measurement:   Goal of Therapy:   < 7.0% Hgb A1C   Action Suggested:  > 8.0% Hgb A1C   Ref:  Diabetes Care, 22, Suppl. 1, 1999   Lab Results  Component Value Date   LDLCALC  12/11/2007    90        Total Cholesterol/HDL:CHD Risk Coronary Heart Disease Risk Table                     Men   Women  1/2 Average Risk   3.4   3.3   CREATININE 0.86 01/30/2017       Scheduled Meds: . amiodarone  200 mg Oral Daily  . aspirin  81 mg Oral Daily  . atorvastatin  80 mg Oral q1800  . folic acid  1 mg Oral Daily  . furosemide  80 mg Intravenous BID  . multivitamin with minerals  1 tablet Oral Daily  . pantoprazole  40 mg Oral Daily  . PARoxetine  20 mg Oral Daily  . pneumococcal 23 valent vaccine  0.5 mL Intramuscular Tomorrow-1000  . potassium chloride  40 mEq Oral TID  . rivaroxaban  20 mg Oral Q supper  . sacubitril-valsartan  1 tablet Oral BID  . spironolactone  25 mg Oral Daily  . thiamine  100 mg Oral Daily   Or  . thiamine  100 mg Intravenous Daily   Continuous Infusions:   LOS: 1 day    Time spent: >30 MINS    Richarda Overlie  [facility] Pager [phone]. If 7PM-7AM, please contact night-coverage at [email or web address], password [facility] 01/30/2017, 12:45 PM  LOS: 1 day

## 2017-01-31 ENCOUNTER — Telehealth (HOSPITAL_COMMUNITY): Payer: Self-pay | Admitting: Surgery

## 2017-01-31 LAB — CBC
HCT: 36.4 % — ABNORMAL LOW (ref 39.0–52.0)
Hemoglobin: 12.1 g/dL — ABNORMAL LOW (ref 13.0–17.0)
MCH: 30.8 pg (ref 26.0–34.0)
MCHC: 33.2 g/dL (ref 30.0–36.0)
MCV: 92.6 fL (ref 78.0–100.0)
PLATELETS: 159 10*3/uL (ref 150–400)
RBC: 3.93 MIL/uL — ABNORMAL LOW (ref 4.22–5.81)
RDW: 14 % (ref 11.5–15.5)
WBC: 5.2 10*3/uL (ref 4.0–10.5)

## 2017-01-31 LAB — COMPREHENSIVE METABOLIC PANEL
ALK PHOS: 86 U/L (ref 38–126)
ALT: 27 U/L (ref 17–63)
AST: 23 U/L (ref 15–41)
Albumin: 3.5 g/dL (ref 3.5–5.0)
Anion gap: 6 (ref 5–15)
BUN: 15 mg/dL (ref 6–20)
CALCIUM: 8.9 mg/dL (ref 8.9–10.3)
CHLORIDE: 105 mmol/L (ref 101–111)
CO2: 27 mmol/L (ref 22–32)
CREATININE: 0.81 mg/dL (ref 0.61–1.24)
GFR calc Af Amer: >60 mL/min (ref >60)
Glucose, Bld: 154 mg/dL — ABNORMAL HIGH (ref 65–99)
Potassium: 3.7 mmol/L (ref 3.5–5.1)
SODIUM: 138 mmol/L (ref 135–145)
Total Bilirubin: 0.8 mg/dL (ref 0.3–1.2)
Total Protein: 6 g/dL — ABNORMAL LOW (ref 6.5–8.1)

## 2017-01-31 LAB — MAGNESIUM: Magnesium: 2.3 mg/dL (ref 1.7–2.4)

## 2017-01-31 LAB — GLUCOSE, CAPILLARY
GLUCOSE-CAPILLARY: 155 mg/dL — AB (ref 65–99)
Glucose-Capillary: 130 mg/dL — ABNORMAL HIGH (ref 65–99)

## 2017-01-31 MED ORDER — POTASSIUM CHLORIDE 20 MEQ/15ML (10%) PO SOLN: 40.0000 meq | Freq: Two times a day (BID) | ORAL | 0 refills | Status: DC

## 2017-01-31 MED ORDER — SPIRONOLACTONE 25 MG PO TABS: 25.0000 mg | ORAL_TABLET | Freq: Every day | ORAL | 0 refills | Status: DC

## 2017-01-31 NOTE — Telephone Encounter (Signed)
I have referred [name] [name] to HF [employer].  He is aware and agreeable.  The appropriate forms have been sent via secure email to [employer] team.

## 2017-01-31 NOTE — Progress Notes (Signed)
Heart Failure Navigator Consult Note  Presentation: per- Dr [name] [name] is seen today for evaluation of acute on chronic systolic CHF at the request of Dr. Maryfrances [name].   [name] [name] a 56 y.o.malewith history of chronic systolic CHF due to NICM (normal cors in July 2016), Echo 12/2016 EF 15%, s/p Medtronic BiV-ICD placement (July 2016), PAF on Xarelto with questionable compliance, ETOH abuse, tobacco abuse, and OSA with CPAP use.   He was admitted 12/25/16-12/27/16 with acute respiratory failure secondary to volume overload. He required Bipap. He was diuresed 13 pounds with IV lasix 80mg  BID, discharge weight was 294 pounds.   He was seen in the Advanced Heart Failure clinic on 01/24/17, weight was up to 309 pounds (weights at home 298- 302 pounds) so his torsemide was increased and his losartan was switched to Entresto 24/26mg  BID.  He presented to the ED on 01/29/17 with acute respiratory failure, O2 saturations in the 50's upon arrival. He was started on Bipap and given 80mg  IV lasix. His breathing is less labored now, remains on Bipap. He tells me that he started taking the [name] [name], had increased his torsemide as ordered. He has not been taking spironolactone as he ran out and the pharmacy is out of stock. He denies ETOH use, has been drinking more tea and water. Denies chest pain. Endorses orthopnea and PND. Pertinent admission labs - creatinine 1.43, BNP 557, WBC 15.4.  Past Medical History:  Diagnosis Date  . CAD (coronary artery disease)    Mild nonobstructive (Cath 09)  . Chronic systolic heart failure (HCC)    Nonischemic CM: echo 4/12 with EF 25-30%, grade 2 diast dysfxn, mild dilated aortic root 43 mm, trivial MR, mod LAE  . COPD (chronic obstructive pulmonary disease) (HCC)   . Dyspnea   . H/O ETOH abuse   . HTN (hypertension)   . Hypersomnia   . Obesity, morbid (HCC)   . OSA on CPAP   . Systolic and diastolic CHF, acute on chronic (HCC) 11/2016  . Tobacco use  disorder   . Ventricular tachycardia [name])     Social History   Social History  . Marital status: Divorced    Spouse name: [name] [name]  . Number of children: 2  . Years of education: [name] [name]   Occupational History  . scrap metal   . disabled, odd jobs    Social History Main Topics  . Smoking status: Former Smoker    Packs/day: 0.50    Years: 4.00    Types: Cigarettes  . Smokeless tobacco: Never Used     Comment: about a pack a week. social  . Alcohol use 25.2 oz/week    42 Cans of beer per week     Comment: 6 beers per day  . Drug use: Yes    Frequency: 7.0 times per week    Types: Codeine, Marijuana     Comment: has discontinued  . Sexual activity: Yes    Partners: Female   Other Topics Concern  . None   Social History Narrative   ** Merged History Encounter **        ECHO:Study Conclusions 12/26/16  - Left ventricle: The cavity size was moderately dilated. Systolic   function was severely reduced. The estimated ejection fraction   was 15%. Diffuse hypokinesis. Doppler parameters are consistent   with restrictive physiology, indicative of decreased left   ventricular diastolic compliance and/or increased left atrial   pressure. - Aortic root: The aortic root was mildly dilated. -  Mitral valve: Calcified annulus. There was mild regurgitation. - Left atrium: The atrium was severely dilated. - Pericardium, extracardiac: A trivial pericardial effusion was   identified.  Impressions:  - Technically difficult; severe global reduction in LV systolic   function; restrictive filling; mildly dilated aortic root; mild   MR; severe LAE.  ------------------------------------------------------------------- Study data:  No prior study was available for comparison.  Study status:  Routine.  Procedure:  The patient reported no pain pre or post test. Transthoracic echocardiography. Image quality was poor. Study completion:  There were no complications. Transthoracic  echocardiography.  M-mode, complete 2D, spectral Doppler, and color Doppler.  Birthdate:  Patient birthdate: 10/27/1960.  Age:  Patient is 56 yr old.  Sex:  Gender: male. BMI: 36.5 kg/m^2.  Blood pressure:     113/77  Patient status: Inpatient.  Study date:  Study date: 12/26/2016. Study time: 03:11 PM.  Location:  Bedside.  BNP    Component Value Date/Time   BNP 557.1 (H) 01/29/2017 0210   BNP 226.9 (H) 05/26/2016 1319    ProBNP    Component Value Date/Time   PROBNP 27.0 09/25/2008 1145     Education Assessment and Provision:  Detailed education and instructions provided on heart failure disease management including the following:  Signs and symptoms of Heart Failure When to call the physician Importance of daily weights Low sodium diet Fluid restriction Medication management Anticipated future follow-up appointments  Patient education given on each of the above topics.  Patient acknowledges understanding and acceptance of all instructions.  I spoke briefly with [name] [name] regarding his HF and current diagnosis.  He tells me that he weighs daily yet had weight increase before this admission.  He was not taking Entresto at home after being prescribed at [name] appt on 01/24/17.  [facility] did not have it in stock and he never picked up.  I have made sure that he has another 30 day free card and he is to pick up Entresto and Spironolactone before going home.  I also reviewed the importance of daily weights and when to contact the Clinic.  I reinforced a low sodium diet and high sodium foods to avoid.  He will follow in the [facility] Clinic and has appt scheduled.    Education Materials:  "Living Better With Heart Failure" Booklet, Daily Weight Tracker Tool    High Risk Criteria for Readmission and/or Poor Patient Outcomes:   EF <30%- 10%  2 or more admissions in 6 months- Yes 2/4mo  Difficult social situation- No--? insurance  Demonstrates medication  noncompliance-No   Barriers of Care:  Knowledge and compliance  Discharge Planning:   Plans to return home with niece and girlfriend in [place].

## 2017-01-31 NOTE — Progress Notes (Signed)
Advanced Heart Failure Rounding Note   Primary Cardiologist: Dr. Shona Simpson. Shirlee Latch   Subjective:    Admitted with acute on chronic CHF, acute respiratory failure.   Weight down 6 pounds. Feels good today, no SOB or chest pain.   Objective:   Weight Range: 295 lb 1.6 oz (133.9 kg) Body mass index is 35.92 kg/m.   Vital Signs:   Temp:  [97.5 F (36.4 C)-98.4 F (36.9 C)] 97.9 F (36.6 C) (05/16 0732) Pulse Rate:  [64-77] 72 (05/16 0732) Resp:  [16-24] 24 (05/16 0732) BP: (91-106)/(59-74) 98/61 (05/16 0732) SpO2:  [96 %-100 %] 100 % (05/16 0732) Weight:  [295 lb 1.6 oz (133.9 kg)] 295 lb 1.6 oz (133.9 kg) (05/16 0340) Last BM Date: 01/30/17  Weight change: Filed Weights   01/30/17 0330 01/31/17 0340  Weight: (!) 301 lb 14.4 oz (136.9 kg) 295 lb 1.6 oz (133.9 kg)    Intake/Output:   Intake/Output Summary (Last 24 hours) at 01/31/17 1007 Last data filed at 01/31/17 0800  Gross per 24 hour  Intake              770 ml  Output             1500 ml  Net             -730 ml     Physical Exam: General: Obese male, NAD.  HEENT: Normal.  Neck: supple. No JVD . Carotids 2+ bilat; no bruits. No lymphadenopathy or thyromegaly appreciated. Cor: PMI laterally displaced. Regular rate and rhythm. No rubs, gallops or murmurs. Lungs: Clear in all lobes, no wheeze. Normal effort. Abdomen: soft, nontender, distention improved. No hepatosplenomegaly. No bruits or masses. + Bowel sounds. Extremities: no cyanosis, clubbing, rash. No peripheral edema.  Neuro: alert & orientedx3, cranial nerves grossly intact. moves all 4 extremities w/o difficulty. Affect pleasant.   Telemetry: V paced, rates in the 80's.   Labs: CBC  Recent Labs  01/29/17 0210 01/30/17 0443 01/31/17 0541  WBC 15.4* 4.9 5.2  NEUTROABS 9.5*  --   --   HGB 13.7 11.2* 12.1*  HCT 40.5 33.5* 36.4*  MCV 92.9 93.1 92.6  PLT 243 137* 159   Basic Metabolic Panel  Recent Labs  01/30/17 0443 01/31/17 0541   NA 141 138  K 3.0* 3.7  CL 102 105  CO2 32 27  GLUCOSE 140* 154*  BUN 15 15  CREATININE 0.86 0.81  CALCIUM 8.8* 8.9  MG  --  2.3   Liver Function Tests  Recent Labs  01/30/17 0443 01/31/17 0541  AST 26 23  ALT 29 27  ALKPHOS 87 86  BILITOT 0.8 0.8  PROT 6.0* 6.0*  ALBUMIN 3.5 3.5   No results for input(s): LIPASE, AMYLASE in the last 72 hours. Cardiac Enzymes  Recent Labs  01/29/17 1101 01/29/17 1407  TROPONINI 0.06* 0.06*    BNP: BNP (last 3 results)  Recent Labs  12/25/16 0600 01/24/17 1042 01/29/17 0210  BNP 1,193.9* 436.3* 557.1*     Imaging/Studies: Transthoracic Echocardiography 12/26/16  Study Conclusions  - Left ventricle: The cavity size was moderately dilated. Systolic   function was severely reduced. The estimated ejection fraction   was 15%. Diffuse hypokinesis. Doppler parameters are consistent   with restrictive physiology, indicative of decreased left   ventricular diastolic compliance and/or increased left atrial   pressure. - Aortic root: The aortic root was mildly dilated. - Mitral valve: Calcified annulus. There was mild regurgitation. - Left atrium:  The atrium was severely dilated. - Pericardium, extracardiac: A trivial pericardial effusion was   identified.  Impressions:  - Technically difficult; severe global reduction in LV systolic   function; restrictive filling; mildly dilated aortic root; mild   MR; severe LAE.   Medications:     Scheduled Medications: . amiodarone  200 mg Oral Daily  . aspirin  81 mg Oral Daily  . atorvastatin  80 mg Oral q1800  . folic acid  1 mg Oral Daily  . multivitamin with minerals  1 tablet Oral Daily  . pantoprazole  40 mg Oral Daily  . PARoxetine  20 mg Oral Daily  . pneumococcal 23 valent vaccine  0.5 mL Intramuscular Tomorrow-1000  . potassium chloride  40 mEq Oral TID  . rivaroxaban  20 mg Oral Q supper  . sacubitril-valsartan  1 tablet Oral BID  . spironolactone  25 mg  Oral Daily  . thiamine  100 mg Oral Daily   Or  . thiamine  100 mg Intravenous Daily    Infusions:   PRN Medications: acetaminophen **OR** acetaminophen, LORazepam **OR** LORazepam   Assessment/Plan   1. Acute on chronic systolic CHF: NICM, normal cors in July 2016, likely due to OSA, heavy ETOH abuse. EF 15% in April 2018.  - Volume status stable, NYHA II - Continue torsemide 60mg  in the am and 40mg  in the pm at home - continue Entresto 24/26 mg BID.  - Will start beta blocker outpatient.    - Continue Spiro 25mg  daily 2. PAF - Continue Amiodarone  - Device interrogated - no afib noted.  BiV pacing 96%.  - Continue Xarelto for CHA2DS2/VASc at least 3. - No change to current plan.  3. Acute renal insuffiencey:  - creatinine improved.  4. Leukocytosis - Resolved.  5. OSA  - Compliant with CPAP.  - No change to current plan.  6. ETOH abuse - He says he has not drank any alcohol since 01/24/17.  - Talked today about the effect of ETOH on his heart.  - Encouraged continued cessation.  7. Substance abuse - Daily THC use.  - Encouraged cessation  8. Acute respiratory failure - resolved.   He has follow up next week in the Advanced heart failure clinic.   Length of Stay: 2   [name], NP  01/31/2017, 10:07 AM [name], NP-C Advanced Heart Failure Team  Pager [phone] M-F 7am-4pm.  Please contact [facility] for night-coverage after hours (4p -7a ) and weekends on [email or web address]  Patient seen and examined with [name], NP. We discussed all aspects of the encounter. I agree with the assessment and plan as stated above.   Much improved. Ok for d/c today with close f/u in HF Clinic. Reinforced need for daily weights and reviewed use of sliding scale diuretics.  Arvilla Meres, MD  11:01 PM

## 2017-01-31 NOTE — Plan of Care (Signed)
Problem: Education: Goal: Knowledge of [facility] Education information/materials will improve Outcome: Progressing Patient aware of plan of care.  RN provided medication education on medications administered per this RN.  Patient stated understanding.     

## 2017-01-31 NOTE — Discharge Summary (Signed)
Physician Discharge Summary  [name] [name] MYT:[id number] DOB: January 09, 1961 DOA: 01/29/2017  PCP: [name], MD  Admit date: 01/29/2017 Discharge date: 01/31/2017  Time spent: 35 minutes  Recommendations for Outpatient Follow-up:  1. Dr.[name] -CHF Clinic on 5/24 at 10:30am   Discharge Diagnoses:  Principal Problem:   Acute on chronic systolic CHF (congestive heart failure) (HCC) Active Problems:   Essential hypertension   NICM- EF 25% 2012 & 20-25% 2016   Atrial fibrillation with RVR - Paroxysmal   OSA (obstructive sleep apnea)   Hyperglycemia   AKI (acute kidney injury) [facility])   Discharge Condition: stable  Diet recommendation: heart healthy low sodium  Filed Weights   01/30/17 0330 01/31/17 0340  Weight: (!) 136.9 kg (301 lb 14.4 oz) 133.9 kg (295 lb 1.6 oz)    History of present illness:  [name] [name] is a 56 y.o. male with a past medical history significant for CHF EF 15%, NICM, ICD and BiV, OSA on CPAP, pAF on amio and Xarelto who presented with acute dyspnea and hypoxia  Hospital Course:  1. Acute on chronic systolic CHF: -due to Non Ischemic cardiomyopathy, normal coronaries on left heart cath in July 2 016 -Felt to be due to heavy EtOH abuse and OSA -diuresed with IV Lasix, negative 5L, followed by CHF team, now felt to be euvolemic and transitioned to home regimen of torsemide, entresto and spironolactone - has an appointment next week on 5/24 with CHF team  2. Acute kidney injury: -Congestive nephropathy. Creatinine improved with diuresis  3. Alcohol dependence in early remission: -advised  4. PAF - Continue Amiodarone  - Device interrogated - no afib noted. BiV pacing 96%.  - Continue Xarelto for CHA2DS2/VASc at least 3.  5. OSA  - Compliant with CPAP  Consultations:  CHF team  Discharge Exam: Vitals:   01/31/17 0340 01/31/17 0732  BP: (!) 98/59 98/61  Pulse: 75 72  Resp: (!) 23 (!) 24  Temp: 97.5 F (36.4 C) 97.9 F (36.6 C)     General: AAOx3 Cardiovascular: S1S2/RRR Respiratory: CTAB  Discharge Instructions   Discharge Instructions    Diet - low sodium heart healthy    Complete by:  As directed    Increase activity slowly    Complete by:  As directed      Current Discharge Medication List    CONTINUE these medications which have CHANGED   Details  potassium chloride 20 MEQ/15ML (10%) SOLN Take 30 mLs (40 mEq total) by mouth 2 (two) times daily. Refills: 0      CONTINUE these medications which have NOT CHANGED   Details  acetaminophen (TYLENOL) 325 MG tablet Take 2 tablets (650 mg total) by mouth every 4 (four) hours as needed for headache or mild pain.    albuterol (PROVENTIL HFA;VENTOLIN HFA) 108 (90 Base) MCG/ACT inhaler Inhale 1 puff into the lungs every 6 (six) hours as needed for wheezing or shortness of breath. Qty: 1 Inhaler, Refills: 1    amiodarone (PACERONE) 200 MG tablet Take 1 tablet (200 mg total) by mouth daily. Qty: 30 tablet, Refills: 0    aspirin 81 MG chewable tablet Chew 81 mg by mouth daily.    atorvastatin (LIPITOR) 80 MG tablet Take 1 tablet (80 mg total) by mouth daily at 6 PM. Qty: 30 tablet, Refills: 0    pantoprazole (PROTONIX) 40 MG tablet Take 1 tablet (40 mg total) by mouth daily. Qty: 30 tablet, Refills: 0    PARoxetine (PAXIL) 20 MG tablet Take  1 tablet (20 mg total) by mouth daily. Qty: 30 tablet, Refills: 0    rivaroxaban (XARELTO) 20 MG TABS tablet Take 1 tablet (20 mg total) by mouth daily with supper. Qty: 30 tablet, Refills: 0    sacubitril-valsartan (ENTRESTO) 24-26 MG Take 1 tablet by mouth 2 (two) times daily. Qty: 60 tablet, Refills: 3    spironolactone (ALDACTONE) 25 MG tablet Take 1 tablet (25 mg total) by mouth daily. Qty: 30 tablet, Refills: 0    torsemide (DEMADEX) 20 MG tablet Take 60 mg (3 tabs) in am and 40 mg (2 tabs) in pm Qty: 150 tablet, Refills: 6       No Known Allergies Follow-up Information    [facility] Follow up on 02/08/2017.   Specialty:  Cardiology Why:  at 10:30 am for hospital follow up. Garage code [id number].  Contact information: [address] [id number] mc [place] [address] [phone]           The results of significant diagnostics from this hospitalization (including imaging, microbiology, ancillary and laboratory) are listed below for reference.    Significant Diagnostic Studies: Dg Chest Port 1 View  Result Date: 01/29/2017 CLINICAL DATA:  Dyspnea EXAM: PORTABLE CHEST 1 VIEW COMPARISON:  10/27/2016 FINDINGS: Stable cardiomegaly. Diffuse bilateral airspace disease consistent with pulmonary edema. Superimposed pneumonia would be difficult to entirely exclude. Right atrial, coronary sinus and right ventricular leads are again noted with ICD device projecting over the left axilla. No acute osseous abnormality. Trace fluid/ edema within the minor fissure on the right. IMPRESSION: Diffuse bilateral airspace opacities with cardiomegaly consistent with pulmonary edema. Superimposed pneumonia would be difficult to entirely exclude. Correlate clinically. Electronically Signed   By: Tollie Eth M.D.   On: 01/29/2017 02:26    Microbiology: Recent Results (from the past 240 hour(s))  MRSA PCR Screening     Status: None   Collection Time: 01/29/17  9:23 AM  Result Value Ref Range Status   MRSA by PCR NEGATIVE NEGATIVE Final    Comment:        The GeneXpert MRSA Assay (FDA approved for NASAL specimens only), is one component of a comprehensive MRSA colonization surveillance program. It is not intended to diagnose MRSA infection nor to guide or monitor treatment for MRSA infections.      Labs: Basic Metabolic Panel:  Recent Labs Lab 01/29/17 0210 01/30/17 0443 01/31/17 0541  NA 138 141 138  K 3.7 3.0* 3.7  CL 101 102 105  CO2 23 32 27  GLUCOSE 284* 140* 154*  BUN 15 15 15   CREATININE 1.43* 0.86 0.81  CALCIUM  9.3 8.8* 8.9  MG  --   --  2.3   Liver Function Tests:  Recent Labs Lab 01/29/17 0210 01/30/17 0443 01/31/17 0541  AST 54* 26 23  ALT 43 29 27  ALKPHOS 117 87 86  BILITOT 1.0 0.8 0.8  PROT 7.1 6.0* 6.0*  ALBUMIN 4.2 3.5 3.5   No results for input(s): LIPASE, AMYLASE in the last 168 hours. No results for input(s): AMMONIA in the last 168 hours. CBC:  Recent Labs Lab 01/29/17 0210 01/30/17 0443 01/31/17 0541  WBC 15.4* 4.9 5.2  NEUTROABS 9.5*  --   --   HGB 13.7 11.2* 12.1*  HCT 40.5 33.5* 36.4*  MCV 92.9 93.1 92.6  PLT 243 137* 159   Cardiac Enzymes:  Recent Labs Lab 01/29/17 1101 01/29/17 1407  TROPONINI 0.06* 0.06*   BNP:  BNP (last 3 results)  Recent Labs  12/25/16 0600 01/24/17 1042 01/29/17 0210  BNP 1,193.9* 436.3* 557.1*    ProBNP (last 3 results) No results for input(s): PROBNP in the last 8760 hours.  CBG:  Recent Labs Lab 01/30/17 0752 01/30/17 1124 01/30/17 1623 01/31/17 0728 01/31/17 1109  GLUCAP 115* 145* 83 155* 130*       SignedZannie Cove MD.  [facility] 01/31/2017, 12:08 PM

## 2017-01-31 NOTE — Care Management Note (Signed)
Case Management Note  Patient Details  Name: [name] [name] MRN: [id number] Date of Birth: 05/24/1961  Subjective/Objective: Pt presented for Acute on Chronic CHF. Pt is from home and has PCP listed- pt confirmed. Pt listed as having Medicaid for Insurance. CM did check the insurance and his Medicaid is not active. Pt to call Financial Counselor- CM did as well to make them aware that pt listed as Medicaid Potential.                  Action/Plan: PT states he has not taken Entresto since prescribed and he did not make MD aware. CM did reach out to Heart Failure Liaison in regards to patient assistance application for Xarelto / Entresto. CM did call [facility] and pt does not have active Medicaid on file- Pt has a discount card on file. Entresto Rx has been filled- pt to take card for free supply to [facility]. Pt has the option to utilize the [facility] outpatient pharmacy for spironolactone for $9.00 vs [employer]. No further needs from CM at this time.    CM did provide pt with 30 day free card.   Expected Discharge Date:  01/31/17               Expected Discharge Plan:  Home/Self Care  In-House Referral:  Financial Counselor  Discharge planning Services  CM Consult, Medication Assistance  Post Acute Care Choice:  NA Choice offered to:  NA  DME Arranged:  N/A DME Agency:  NA  HH Arranged:  NA HH Agency:  NA  Status of Service:  Completed, signed off  If discussed at Long Length of Stay Meetings, dates discussed:    Additional Comments:  Gala Lewandowsky, RN 01/31/2017, 12:56 PM

## 2017-02-01 ENCOUNTER — Other Ambulatory Visit: Payer: Self-pay | Admitting: Oncology

## 2017-02-02 ENCOUNTER — Telehealth (HOSPITAL_COMMUNITY): Payer: Self-pay

## 2017-02-02 NOTE — Telephone Encounter (Signed)
I called to set up a CHP appointment with [name] [name].  Message left and I will call again next week to f/u with him.

## 2017-02-04 ENCOUNTER — Other Ambulatory Visit: Payer: Self-pay | Admitting: Oncology

## 2017-02-05 ENCOUNTER — Telehealth (HOSPITAL_COMMUNITY): Payer: Self-pay | Admitting: Pharmacist

## 2017-02-05 NOTE — Telephone Encounter (Signed)
Entresto PA denied by [facility] Medicaid. It looks like he has family planning Medicaid which does not cover Entresto. Patient needs to follow up with SS office/Medicaid to correct this.   Tyler Deis. Bonnye Fava, PharmD, BCPS, CPP Clinical Pharmacist Pager: [phone] Phone: [phone] 02/05/2017 9:32 AM

## 2017-02-08 ENCOUNTER — Other Ambulatory Visit (HOSPITAL_COMMUNITY): Payer: Self-pay

## 2017-02-08 ENCOUNTER — Ambulatory Visit (HOSPITAL_COMMUNITY)
Admission: RE | Admit: 2017-02-08 | Discharge: 2017-02-08 | Disposition: A | Discharge status: Home / Self Care | Payer: Medicaid Other | Source: Ambulatory Visit | Attending: Internal Medicine | Admitting: Internal Medicine

## 2017-02-08 VITALS — BP 106/60 | HR 77 | Wt 303.0 lb

## 2017-02-08 DIAGNOSIS — Z6836 Body mass index (BMI) 36.0-36.9, adult: Secondary | ICD-10-CM | POA: Diagnosis not present

## 2017-02-08 DIAGNOSIS — F1210 Cannabis abuse, uncomplicated: Secondary | ICD-10-CM

## 2017-02-08 DIAGNOSIS — I2510 Atherosclerotic heart disease of native coronary artery without angina pectoris: Secondary | ICD-10-CM

## 2017-02-08 DIAGNOSIS — G4733 Obstructive sleep apnea (adult) (pediatric): Secondary | ICD-10-CM

## 2017-02-08 DIAGNOSIS — I110 Hypertensive heart disease with heart failure: Secondary | ICD-10-CM | POA: Insufficient documentation

## 2017-02-08 DIAGNOSIS — I429 Cardiomyopathy, unspecified: Secondary | ICD-10-CM | POA: Diagnosis not present

## 2017-02-08 DIAGNOSIS — I480 Paroxysmal atrial fibrillation: Secondary | ICD-10-CM | POA: Diagnosis not present

## 2017-02-08 DIAGNOSIS — I428 Other cardiomyopathies: Secondary | ICD-10-CM

## 2017-02-08 DIAGNOSIS — Z7901 Long term (current) use of anticoagulants: Secondary | ICD-10-CM

## 2017-02-08 DIAGNOSIS — Z95810 Presence of automatic (implantable) cardiac defibrillator: Secondary | ICD-10-CM | POA: Diagnosis not present

## 2017-02-08 DIAGNOSIS — F1010 Alcohol abuse, uncomplicated: Secondary | ICD-10-CM

## 2017-02-08 DIAGNOSIS — I10 Essential (primary) hypertension: Secondary | ICD-10-CM

## 2017-02-08 DIAGNOSIS — Z7982 Long term (current) use of aspirin: Secondary | ICD-10-CM | POA: Insufficient documentation

## 2017-02-08 DIAGNOSIS — I5042 Chronic combined systolic (congestive) and diastolic (congestive) heart failure: Secondary | ICD-10-CM | POA: Insufficient documentation

## 2017-02-08 DIAGNOSIS — I5022 Chronic systolic (congestive) heart failure: Secondary | ICD-10-CM

## 2017-02-08 MED ORDER — PANTOPRAZOLE SODIUM 40 MG PO TBEC: 40.0000 mg | DELAYED_RELEASE_TABLET | Freq: Every day | ORAL | 0 refills | Status: DC

## 2017-02-08 MED ORDER — POTASSIUM CHLORIDE CRYS ER 20 MEQ PO TBCR: 40.0000 meq | EXTENDED_RELEASE_TABLET | Freq: Two times a day (BID) | ORAL | 3 refills | Status: DC

## 2017-02-08 MED ORDER — PAROXETINE HCL 20 MG PO TABS: 20.0000 mg | ORAL_TABLET | Freq: Every day | ORAL | 0 refills | Status: DC

## 2017-02-08 MED ORDER — SACUBITRIL-VALSARTAN 24-26 MG PO TABS: 1.0000 | ORAL_TABLET | Freq: Two times a day (BID) | ORAL | 11 refills | Status: DC

## 2017-02-08 MED FILL — SPIRONOLACTONE 25 MG TABLET: 25 | 34 days supply | Qty: 34 | Fill #0

## 2017-02-08 MED FILL — TORSEMIDE 20 MG TABLET: 20 | 34 days supply | Qty: 204 | Fill #0

## 2017-02-08 MED FILL — CARVEDILOL 3.125 MG TABLET: 3.125 | 34 days supply | Qty: 68 | Fill #0

## 2017-02-08 MED FILL — POTASSIUM CL ER 20 MEQ TABL: 20 | 34 days supply | Qty: 136 | Fill #0 | Status: TO

## 2017-02-08 MED FILL — AMIODARONE HCL 200 MG TAB: 200 | 34 days supply | Qty: 34 | Fill #0

## 2017-02-08 MED FILL — ATORVASTATIN 80 MG TABLET: 80 | 34 days supply | Qty: 34 | Fill #0

## 2017-02-08 NOTE — Progress Notes (Signed)
Advanced Heart Failure Clinic Note   Referring Physician: Dr. Cyndie Chime Primary Care: Dr. Windle Guard, MD Primary Cardiologist: Dr. Shona Simpson. Shirlee Latch  HPI: Mr. Lefrancois is a 56 year old male with a past medical history of chronic systolic CHF (last EF 15% in April 2018), dilated NICM s/p bi V ICD, PAF with intermittent compliance with Xarelto, ETOH abuse, tobacco abuse, OSA on CPAP, and HTN.   Admitted 12/25/16-12/27/16 with acute on chronic systolic CHF, volume overload. He was diuresed 11 pounds on IV lasix.  Discharge weight was 294 pounds. It was felt that the etiology of his NICM was ETOH vs hypertension related.   Admitted 5/14-5/16 with volume overload, acute respiratory failure requiring Bipap. He was not taking his Entresto as he was confused about dosing and there was a mix up his pharmacy. He was diuresed with IV lasix, discharge weight was 295 pounds.   He returns today for hospital follow up. Says he is feeling so much better than when he left the hospital, he has become diligent about how much he is drinking throughout the day. He has stopped drinking beer, still eating high salt foods. He does weigh himself at home, weights  297 - 298 pounds. No SOB with walking into clinic, no SOB with working outside. He denies SOB with ADL's and at rest. Taking all his medications now. He is meeting with paramedicine today.   OPTIVOL: Fluid status dry. Thoracic impedence low. 4-5 hours of activity a day.   Past Medical History:  Diagnosis Date  . CAD (coronary artery disease)    Mild nonobstructive (Cath 09)  . Chronic systolic heart failure (HCC)    Nonischemic CM: echo 4/12 with EF 25-30%, grade 2 diast dysfxn, mild dilated aortic root 43 mm, trivial MR, mod LAE  . COPD (chronic obstructive pulmonary disease) (HCC)   . Dyspnea   . H/O ETOH abuse   . HTN (hypertension)   . Hypersomnia   . Obesity, morbid (HCC)   . OSA on CPAP   . Systolic and diastolic CHF, acute on chronic (HCC)  11/2016  . Tobacco use disorder   . Ventricular tachycardia [facility])     Current Outpatient Prescriptions  Medication Sig Dispense Refill  . acetaminophen (TYLENOL) 325 MG tablet Take 2 tablets (650 mg total) by mouth every 4 (four) hours as needed for headache or mild pain.    Marland Kitchen albuterol (PROVENTIL HFA;VENTOLIN HFA) 108 (90 Base) MCG/ACT inhaler Inhale 1 puff into the lungs every 6 (six) hours as needed for wheezing or shortness of breath. 1 Inhaler 1  . amiodarone (PACERONE) 200 MG tablet Take 1 tablet (200 mg total) by mouth daily. 30 tablet 0  . aspirin 81 MG chewable tablet Chew 81 mg by mouth daily.    Marland Kitchen atorvastatin (LIPITOR) 80 MG tablet Take 1 tablet (80 mg total) by mouth daily at 6 PM. 30 tablet 0  . pantoprazole (PROTONIX) 40 MG tablet Take 1 tablet (40 mg total) by mouth daily. 30 tablet 0  . PARoxetine (PAXIL) 20 MG tablet Take 1 tablet (20 mg total) by mouth daily. 30 tablet 0  . potassium chloride 20 MEQ/15ML (10%) SOLN Take 30 mLs (40 mEq total) by mouth 2 (two) times daily.  0  . rivaroxaban (XARELTO) 20 MG TABS tablet Take 1 tablet (20 mg total) by mouth daily with supper. (Patient taking differently: Take 20 mg by mouth daily with supper. Taking every other day prior to admission) 30 tablet 0  . sacubitril-valsartan (ENTRESTO)  24-26 MG Take 1 tablet by mouth 2 (two) times daily. 60 tablet 3  . spironolactone (ALDACTONE) 25 MG tablet Take 1 tablet (25 mg total) by mouth daily. 30 tablet 0  . torsemide (DEMADEX) 20 MG tablet Take 60 mg (3 tabs) in am and 40 mg (2 tabs) in pm 150 tablet 6   No current facility-administered medications for this encounter.     No Known Allergies    Social History   Social History  . Marital status: Divorced    Spouse name: N/A  . Number of children: 2  . Years of education: N/A   Occupational History  . scrap metal   . disabled, odd jobs    Social History Main Topics  . Smoking status: Former Smoker    Packs/day: 0.50    Years:  4.00    Types: Cigarettes  . Smokeless tobacco: Never Used     Comment: about a pack a week. social  . Alcohol use 25.2 oz/week    42 Cans of beer per week     Comment: 6 beers per day  . Drug use: Yes    Frequency: 7.0 times per week    Types: Codeine, Marijuana     Comment: has discontinued  . Sexual activity: Yes    Partners: Female   Other Topics Concern  . Not on file   Social History Narrative   ** Merged History Encounter **          Family History  Problem Relation Age of Onset  . Other Mother        cardiac surgery. late 1990s  . Heart Problems Mother        CABG AGE 63  . Congestive Heart Failure Father   . Healthy Father        AGE 64  . Coronary artery disease Unknown   . Healthy Brother        AGE 39  . Healthy Sister        AGE 33  . Healthy Sister        AGE 28  . Healthy Son   . Healthy Son   . Healthy Daughter     Vitals:   02/08/17 1047  BP: 106/60  Pulse: 77  SpO2: 96%  Weight: (!) 303 lb (137.4 kg)     PHYSICAL EXAM: General: Male, NAD. Walked into clinic without difficulty.   HEENT: normal Neck: supple. 5-6 cm JVD. Carotids 2+ bilat; no bruits. No lymphadenopathy or thyromegaly appreciated. Cor: PMI nondisplaced. Regular rate and rhythm. No rubs, gallops or murmurs. Lungs: clear in all lobes, no wheezing.  Abdomen: soft, nontender, distended. No hepatosplenomegaly. No bruits or masses. Good bowel sounds. Extremities: no cyanosis, clubbing, rash. No peripheral edema.   Neuro: alert & oriented x 3, cranial nerves grossly intact. moves all 4 extremities w/o difficulty. Affect pleasant.    ASSESSMENT & PLAN: 1. Chronic combined systolic and diastolic CHF: NICM, EF 15%. Normal cors in July 2016. ETOH vs. Hypertension - NYHA II - Volume status  - he is taking 60mg  torsemide BID, will continue at this dose.   - Continue Entreso 24/26mg  BID.  - Has Bi-V ICD, follows with Dr. Graciela Husbands - Continue Cleda Daub 25mg  daily.  - Start Coreg 3.125  mg BID.  2. PAF - Continue Amiodarone, advised him to get yearly eye exams.  - LFT's ok in May 2018.  - TSH 1.1 in April 2018. -This patients CHA2DS2-VASc Score and unadjusted Ischemic Stroke  Rate (% per year) is equal to 2.2 % stroke rate/year from a score of 2 Above score calculated as 1 point each if present [CHF, HTN, DM, Vascular=MI/PAD/Aortic Plaque, Age if 65-74, or Male], 2 points each if present [Age > 75, or Stroke/TIA/TE] - Continue Xarelto for anticoagulation.  - Denies melena and hematochezia.  3. ETOH abuse - Has stopped drinking. Congratulated him.  4. Tobacco abuse - Not smoking cigarettes.  5. Substance abuse - Continues to smoke marijuana daily.  - he is not interested in cessation.   Follow up in 2 weeks.     [name], NP 02/08/17

## 2017-02-08 NOTE — Patient Instructions (Addendum)
Medications sent to [facility] under Heart Failure Fund... 1.  INCREASE torsemide to 60 mg twice daily. 2.  CONTINUE Spironolactone 25 mg tablet once daily. 3.  START Carvedilol (Coreg) 3.125 mg tablet twice daily. 4.  CHANGE Potassium to tablets: 40 meq twice daily. 5.  CONTINUE Atorvastatin 80 mg once daily. 6.  CONTINUE Amiodarone 200 mg tablet once daily.  Other medications refilled not covered under Heart Failure Fund... 1. Paxil 2. Protonix  Follow up 2 weeks.  Do the following things EVERYDAY: 1) Weigh yourself in the morning before breakfast. Write it down and keep it in a log. 2) Take your medicines as prescribed 3) Eat low salt foods-Limit salt (sodium) to 2000 mg per day.  4) Stay as active as you can everyday 5) Limit all fluids for the day to less than 2 liters

## 2017-02-08 NOTE — Progress Notes (Signed)
Advanced Heart Failure Medication Review by a Pharmacist  Does the patient  feel that his/her medications are working for him/her?  yes  Has the patient been experiencing any side effects to the medications prescribed?  no  Does the patient measure his/her own blood pressure or blood glucose at home?  no   Does the patient have any problems obtaining medications due to transportation or finances?   Yes - only has Medicaid family planning - working on full Medicaid benefits  Understanding of regimen: fair Understanding of indications: fair Potential of compliance: good Patient understands to avoid NSAIDs. Patient understands to avoid decongestants.  Issues to address at subsequent visits: Medicaid status   Pharmacist comments: Mr. Carse is a pleasant 56 yo M presenting without a medication list. He reports good compliance with his regimen but his Medicaid status recently changed to family planning only so he is not able to afford his medications. While he is working on obtaining full Medicaid benefits, will send his HF medications through HF fund and have him apply for Sherryll Burger and Xarelto patient assistance programs.   Tyler Deis. Bonnye Fava, PharmD, BCPS, CPP Clinical Pharmacist Pager: [phone] Phone: [phone] 02/08/2017 11:32 AM      Time with patient: 10 minutes Preparation and documentation time: 10 minutes Total time: 20 minutes

## 2017-02-08 NOTE — Progress Notes (Signed)
Paramedicine Encounter   Patient ID: [name] [name] , male,   DOB: 1961-06-07,56 y.o.,  MRN: [id number]   Met patient in clinic today with provider erin.   Pt receives SSI and is between insurance companies at this time.  Doroteo Bradford talked with pt about his medications and she gave him samples.  She also told him that she would assist him with getting entresto and xarelto via program due to him not have insurance. He reports that he is taking torsemide 60 BID but was supposed to take 60 am and 40 pm. Pt stated that he takes 60 in the . Watching how much he drinks and has cut back on drinking beer.  Eating more fruit now.  They talked about his diet. Smokes marijuana daily. He has a lot more energy. Hasn't been taking paxil and protonix; erin will send refill request.   Pt will start receiving his medications through the out pt pharmacy due to him not having insurance.  Pt agrees to [facility] visits and he signed the HIPPA form.  *add carvedilol 3.125 Torsemide increased 60 BID Weighing 297 at home, 294 when he left the hospital  Time spent with patient 40 mins  Sheniece Ruggles, EMT-Paramedic 02/08/2017   ACTION: Next visit planned for next week

## 2017-02-13 ENCOUNTER — Telehealth: Payer: Self-pay | Admitting: Licensed Clinical Social Worker

## 2017-02-13 NOTE — Telephone Encounter (Signed)
CSW referred to assist patient with follow up on Medicaid. Patient apparently had medicaid in the past but now has family planning. Patient unsure what happened although reports his mail goes to [facility] but he resides in [facility] and was unaware that medicaid was county based. Patient reports he has been receiving Social Security Disability (SSD) for two years. CSW informed him that he should be entilitled to medicare and provided number for patient to contact. Patient verbalizes understanding and will follow up and return call to CSW if needed. Lasandra Beech, LCSW, CCSW-MCS [phone]

## 2017-02-13 NOTE — Telephone Encounter (Signed)
CSW referred to follow up with patient regarding medicaid. Patient reports he previously had medicaid although currently has family planning medicaid. Patient has been receiving Social Security Disability (SSD) for the past two years and CSW informed patient that he should be eligible for Medicare. CSW provided number for Medicare follow up and patient verbalizes understanding of follow up. CSW encouraged patient to return call if further assistance needed. Lasandra Beech, LCSW, CCSW-MCS [phone]

## 2017-02-16 ENCOUNTER — Other Ambulatory Visit: Payer: Self-pay

## 2017-02-20 ENCOUNTER — Telehealth (HOSPITAL_COMMUNITY): Payer: Self-pay | Admitting: Pharmacist

## 2017-02-20 NOTE — Telephone Encounter (Signed)
[employer] patient assistance approved for [employer] 24-26 mg BID through 02/16/18.   Tyler Deis. Bonnye Fava, PharmD, BCPS, CPP Clinical Pharmacist Pager: [phone] Phone: [phone] 02/20/2017 11:57 AM

## 2017-02-21 ENCOUNTER — Telehealth (HOSPITAL_COMMUNITY): Payer: Self-pay | Admitting: Pharmacist

## 2017-02-21 NOTE — Telephone Encounter (Signed)
[employer] patient assistance approved for Xarelto 20 mg daily through 02/16/18.   Tyler Deis. Bonnye Fava, PharmD, BCPS, CPP Clinical Pharmacist Pager: [phone] Phone: [phone] 02/21/2017 11:24 AM

## 2017-02-26 ENCOUNTER — Telehealth (HOSPITAL_COMMUNITY): Payer: Self-pay | Admitting: Pharmacist

## 2017-02-26 ENCOUNTER — Other Ambulatory Visit (HOSPITAL_COMMUNITY): Payer: Self-pay

## 2017-02-26 NOTE — Telephone Encounter (Signed)
Spoke with [name] [name], paramedic seeing [name] today at his home, who states that his carvedilol is not on his list but he is appropriately taking 3.125 mg BID as prescribed and he is taking fluoxetine not paroxetine. Will adjust medication list accordingly.   Tyler Deis. Bonnye Fava, PharmD, BCPS, CPP Clinical Pharmacist Pager: [phone] Phone: [phone] 02/26/2017 3:26 PM

## 2017-02-26 NOTE — Progress Notes (Signed)
Paramedicine Encounter    Patient ID: [name] [name], male    DOB: March 07, 1961, 56 y.o.   MRN: [id number]   Patient Care Team: [name], MD as PCP - General (Cardiology) [name], MD (Family Medicine)  Patient Active Problem List   Diagnosis Date Noted  . AKI (acute kidney injury) (HCC)   . Acute on chronic systolic CHF (congestive heart failure) (HCC) 01/29/2017  . HTN (hypertension) 12/25/2016  . Obesity 12/25/2016  . OSA (obstructive sleep apnea) 12/25/2016  . Hyperglycemia 12/25/2016  . Acute respiratory failure with hypoxia (HCC) 12/25/2016  . Chronic anticoagulation 06/27/2016  . Encounter for therapeutic drug monitoring 04/19/2015  . S/P ICD (internal cardiac defibrillator) procedure Medtronic CRT ICD 04/15/2015  . Atrial fibrillation with RVR - Paroxysmal 04/08/2015  . NICM- EF 25% 2012 & 20-25% 2016 04/06/2015  . Troponin level elevated 04/06/2015  . Rectal bleeding 10/26/2011  . Hydrocele, left 05/24/2011  . Tobacco use disorder- 1/2 pk a day   . H/O ETOH abuse 6-8 beers a day   . Obesity 09/27/2008  . CAD- minor CAD 2009 and 2016 09/27/2008  . VENTRICULAR TACHYCARDIA 09/27/2008  . Essential hypertension 06/19/2008  . Chronic systolic heart failure (HCC) 06/19/2008    Current Outpatient Prescriptions:  .  acetaminophen (TYLENOL) 325 MG tablet, Take 2 tablets (650 mg total) by mouth every 4 (four) hours as needed for headache or mild pain., Disp: , Rfl:  .  albuterol (PROVENTIL HFA;VENTOLIN HFA) 108 (90 Base) MCG/ACT inhaler, Inhale 1 puff into the lungs every 6 (six) hours as needed for wheezing or shortness of breath., Disp: 1 Inhaler, Rfl: 1 .  amiodarone (PACERONE) 200 MG tablet, Take 1 tablet (200 mg total) by mouth daily., Disp: 30 tablet, Rfl: 0 .  aspirin 81 MG chewable tablet, Chew 81 mg by mouth daily., Disp: , Rfl:  .  atorvastatin (LIPITOR) 80 MG tablet, Take 1 tablet (80 mg total) by mouth daily at 6 PM., Disp: 30 tablet, Rfl: 0 .   pantoprazole (PROTONIX) 40 MG tablet, Take 1 tablet (40 mg total) by mouth daily., Disp: 30 tablet, Rfl: 0 .  potassium chloride SA (KLOR-CON M20) 20 MEQ tablet, Take 2 tablets (40 mEq total) by mouth 2 (two) times daily., Disp: 90 tablet, Rfl: 3 .  rivaroxaban (XARELTO) 20 MG TABS tablet, Take 1 tablet (20 mg total) by mouth daily with supper., Disp: 30 tablet, Rfl: 0 .  sacubitril-valsartan (ENTRESTO) 24-26 MG, Take 1 tablet by mouth 2 (two) times daily., Disp: 60 tablet, Rfl: 11 .  spironolactone (ALDACTONE) 25 MG tablet, Take 1 tablet (25 mg total) by mouth daily., Disp: 30 tablet, Rfl: 0 .  torsemide (DEMADEX) 20 MG tablet, Take 60 mg by mouth 2 (two) times daily., Disp: , Rfl:  .  carvedilol (COREG) 3.125 MG tablet, Take 3.125 mg by mouth 2 (two) times daily with a meal., Disp: , Rfl:  .  FLUoxetine (PROZAC) 20 MG tablet, Take 20 mg by mouth daily., Disp: , Rfl:  No Known Allergies   Social History   Social History  . Marital status: Divorced    Spouse name: N/A  . Number of children: 2  . Years of education: N/A   Occupational History  . scrap metal   . disabled, odd jobs    Social History Main Topics  . Smoking status: Former Smoker    Packs/day: 0.50    Years: 4.00    Types: Cigarettes  . Smokeless tobacco: Never Used  Comment: about a pack a week. social  . Alcohol use 25.2 oz/week    42 Cans of beer per week     Comment: 6 beers per day  . Drug use: Yes    Frequency: 7.0 times per week    Types: Codeine, Marijuana     Comment: has discontinued  . Sexual activity: Yes    Partners: Female   Other Topics Concern  . Not on file   Social History Narrative   ** Merged History Encounter **        Physical Exam  Constitutional: He is oriented to person, place, and time. He appears well-developed.  Neck: Normal range of motion.  Cardiovascular: Normal rate and regular rhythm.   Pulmonary/Chest: Effort normal and breath sounds normal.  Abdominal: Soft. He  exhibits no distension.  Musculoskeletal: Normal range of motion. He exhibits no edema.  Neurological: He is alert and oriented to person, place, and time.  Skin: Skin is warm and dry.  Psychiatric: He has a normal mood and affect.        SAFE - 02/26/17 1500      Situation   Admitting diagnosis CHF   Heart failure history Exisiting   Comorbidities Atrial fibillation;HTN;Sleep Apnea   Readmitted within 30 days Yes   Hospital admission within past 12 months Yes   number of hospital admissions 2   number of ED visits 2     Assessment   Lives alone No   Primary support person Dede Susette Racer   Mode of transportation personal car   Other services involved None   Home equipement Scale     Weight   Weighs self daily Yes   Scale provided No   Records on weight chart Yes     Resources   Has "Living better w/heart failure" book Yes   Has HF Zone tool Yes   Able to identify yellow zone signs/when to call MD Yes   Records zone daily Yes     Medications   Uses a pill box No   Difficulty obtaining medications No   Mail order medications Yes   New medications at home today No   Which ones xarelto, entresto   Next scheduled delivery of medications 03/18/17     Nutrition   Patient receives meals on wheels No   Patient follows low sodium diet Yes   Has foods at home that meet the current recommended diet Yes   Patient follows low sugar/card diet Yes   Nutritional concerns/issues No     Activity Level   ADL's/Mobility Independent   How many feet can patient ambulate >50'   Typical activity level Active   Barriers None     Urine   Difficulty urinating No   Changes in urine None     Time spent with patient   Time spent with patient  68 Minutes         Community Paramedic Living Environment - 02/26/17 1500      Outside of House   Sidewalk and pathway to house is level and free from any hazards Yes   Driveway is free from debris/snow/ice Yes   Outside stairs are stable  and have sturdy handrail Yes   Porch lights are working and provide adequate lighting Yes     Living Room   Furniture is of adequate height and offers arm rests that assist in getting up and down Yes   Floor is free from any clutter that would create  tripping hazards No   All cords are either behind furniture or secured in a manner that does not cause trip hazards No   All rugs are secured to floor with double-sided tape No   Lighting is adequate to light room Yes   All lighting has an easily accessible on/off switch Yes   Phone is readily accessible near favorite seating areas Yes   Emergency numbers are printed near all phones in house No     Kitchen   Items used most often are within easy reach on low shelves Yes   Step stool is present, is sturdy and has a handrail No   Floor mats are non-slip tread and secured to floor N/A   Oven controls are within easy reach Yes   Kitchen lighting is adequate and easy to reach switches Yes   ABC fire extinguisher is located in kitchen No     Stairs   Carpet is properly secured to stairs and/or all wood is properly secured N/A   Handrail is present and sturdy N/A   Stairs are free from any clutter N/A   Stairway is adequately lit N/A     Bathroom   Tub and shower have a non-slip surface Yes   Tub and/or shower have a grab bar for stability No   Toilet has a raised seat No   Grab bar is attached near toilet for assistance No   Pathway from bedroom to bathroom is free from clutter and well lit for ease of movement in the middle of the night Yes     Bedroom   Floor is free from clutter Yes   Light is near bed and is easy to turn on Yes   Phone is next to bed and within easy reach Yes   Flashlight is near bed in case of emergency Yes     General   Smoke detectors in all areas of the house (each floor) and tested Yes   CO detectors on each floor of house and tested No   Flashlights are handy throughout the home Yes   Resident has all  medical information readily available and in an area emergency providers will easily find Yes   All heaters are away from any type of flammable material N/A     Overall Tips   Homeowner ha good non-skid shoes to move around house Yes   All assisted walking devices are readily accessible and in good condition N/A   There is a phone near the floor for ease of reach in case of a fall YES   All O2 tubing is less than 50 ft. and is not a trip hazard N/A   Resident has had an annual hearing and vision check by a physician No   Resident has the proper hearing and visual aids prescribed and are in good working order No   All medications are properly stored and labeled to avoid confusion on dosage, time to take, and avoidance of missed doses Yes       Future Appointments Date Time Provider Department Center  02/27/2017 9:30 AM [facility] PA/NP [facility] None   BP (!) 96/58 (BP Location: Right Arm, Patient Position: Sitting, Cuff Size: Large)   Pulse 66   Resp 16   Wt 294 lb (133.4 kg)   SpO2 96%   BMI 35.79 kg/m  Weight yesterday- 293 lbs Last visit weight- n/a  [name] [name] was seen at home for an initial home visit today. He reports feeling  well and denied having any SOB, H/A or dizziness. His daughter was present for the visit and he says he relies on either his daughter or his girlfriend for help at home if he needs anything. His medications were verified but he does not have a pillbox at present. Paramedicine will provide one at the next visit.  Jacqualine Code, EMT 02/26/17  ACTION: Home visit completed Next visit planned for 1 week

## 2017-02-27 ENCOUNTER — Inpatient Hospital Stay (HOSPITAL_COMMUNITY): Admission: RE | Admit: 2017-02-27 | Payer: Medicaid Other | Source: Ambulatory Visit

## 2017-03-01 ENCOUNTER — Inpatient Hospital Stay (HOSPITAL_COMMUNITY): Admission: RE | Admit: 2017-03-01 | Payer: Medicaid Other | Source: Ambulatory Visit

## 2017-03-05 ENCOUNTER — Ambulatory Visit (HOSPITAL_COMMUNITY)
Admission: RE | Admit: 2017-03-05 | Discharge: 2017-03-05 | Disposition: A | Discharge status: Home / Self Care | Payer: Medicaid Other | Source: Ambulatory Visit | Attending: Cardiology | Admitting: Cardiology

## 2017-03-05 ENCOUNTER — Other Ambulatory Visit (HOSPITAL_COMMUNITY): Payer: Self-pay

## 2017-03-05 ENCOUNTER — Encounter (HOSPITAL_COMMUNITY): Payer: Self-pay

## 2017-03-05 VITALS — BP 124/68 | HR 64 | Wt 301.6 lb

## 2017-03-05 DIAGNOSIS — Z7901 Long term (current) use of anticoagulants: Secondary | ICD-10-CM

## 2017-03-05 DIAGNOSIS — Z7982 Long term (current) use of aspirin: Secondary | ICD-10-CM | POA: Diagnosis not present

## 2017-03-05 DIAGNOSIS — I2510 Atherosclerotic heart disease of native coronary artery without angina pectoris: Secondary | ICD-10-CM | POA: Diagnosis not present

## 2017-03-05 DIAGNOSIS — Z79899 Other long term (current) drug therapy: Secondary | ICD-10-CM | POA: Diagnosis not present

## 2017-03-05 DIAGNOSIS — J9600 Acute respiratory failure, unspecified whether with hypoxia or hypercapnia: Secondary | ICD-10-CM | POA: Diagnosis not present

## 2017-03-05 DIAGNOSIS — I110 Hypertensive heart disease with heart failure: Secondary | ICD-10-CM | POA: Diagnosis present

## 2017-03-05 DIAGNOSIS — I10 Essential (primary) hypertension: Secondary | ICD-10-CM | POA: Diagnosis not present

## 2017-03-05 DIAGNOSIS — J449 Chronic obstructive pulmonary disease, unspecified: Secondary | ICD-10-CM | POA: Diagnosis not present

## 2017-03-05 DIAGNOSIS — F1010 Alcohol abuse, uncomplicated: Secondary | ICD-10-CM | POA: Diagnosis not present

## 2017-03-05 DIAGNOSIS — I429 Cardiomyopathy, unspecified: Secondary | ICD-10-CM | POA: Diagnosis present

## 2017-03-05 DIAGNOSIS — I5022 Chronic systolic (congestive) heart failure: Secondary | ICD-10-CM | POA: Diagnosis not present

## 2017-03-05 DIAGNOSIS — I480 Paroxysmal atrial fibrillation: Secondary | ICD-10-CM | POA: Insufficient documentation

## 2017-03-05 DIAGNOSIS — G4733 Obstructive sleep apnea (adult) (pediatric): Secondary | ICD-10-CM | POA: Diagnosis not present

## 2017-03-05 DIAGNOSIS — I5042 Chronic combined systolic (congestive) and diastolic (congestive) heart failure: Secondary | ICD-10-CM | POA: Insufficient documentation

## 2017-03-05 DIAGNOSIS — F17210 Nicotine dependence, cigarettes, uncomplicated: Secondary | ICD-10-CM | POA: Insufficient documentation

## 2017-03-05 DIAGNOSIS — Z95810 Presence of automatic (implantable) cardiac defibrillator: Secondary | ICD-10-CM | POA: Insufficient documentation

## 2017-03-05 DIAGNOSIS — I428 Other cardiomyopathies: Secondary | ICD-10-CM | POA: Diagnosis not present

## 2017-03-05 DIAGNOSIS — F1210 Cannabis abuse, uncomplicated: Secondary | ICD-10-CM | POA: Insufficient documentation

## 2017-03-05 MED ORDER — SACUBITRIL-VALSARTAN 49-51 MG PO TABS: 1.0000 | ORAL_TABLET | Freq: Two times a day (BID) | ORAL | 6 refills | Status: DC

## 2017-03-05 NOTE — Patient Instructions (Signed)
INCREASE Entresto to 49-51 mg twice daily. May "double up" on current 24-26 mg tablets that you have at home (Take 2 tabs twice daily). New Rx has been sent for 49-51 mg tablets (Take 1 tab twice daily).  Return in 10 days for lab work.  Follow up 2 months. Take all medication as prescribed the day of your appointment. Bring all medications with you to your appointment.  Do the following things EVERYDAY: 1) Weigh yourself in the morning before breakfast. Write it down and keep it in a log. 2) Take your medicines as prescribed 3) Eat low salt foods-Limit salt (sodium) to 2000 mg per day.  4) Stay as active as you can everyday 5) Limit all fluids for the day to less than 2 liters

## 2017-03-05 NOTE — Progress Notes (Signed)
Advanced Heart Failure Clinic Note   Referring Physician: Dr. Cyndie Chime Primary Care: Dr. Windle Guard, MD Primary Cardiologist: Dr. Shona Simpson. [name] [name]  HPI: [name] is a 56 year old male with a past medical history of chronic systolic CHF (last EF 15% in April 2018), dilated NICM s/p bi V ICD, PAF with intermittent compliance with Xarelto, ETOH abuse, tobacco abuse, OSA on CPAP, and HTN.   Admitted 12/25/16-12/27/16 with acute on chronic systolic CHF, volume overload. He was diuresed 11 pounds on IV lasix.  Discharge weight was 294 pounds. It was felt that the etiology of his NICM was ETOH vs hypertension related.   Admitted 5/14-5/16 with volume overload, acute respiratory failure requiring Bipap. He was not taking his Entresto as he was confused about dosing and there was a mix up his pharmacy. He was diuresed with IV lasix, discharge weight was 295 pounds.   He returns today for HF follow up. He has been seen by paramedicine. Weights at home 294-301 pounds. No SOB with walking around the yard, no SOB at rest or with ADL's. Eating some high salt foods, had steak this weekend. He has been more intentional about how much water he is drinking, drinking less than 2L a day. He did drink a few beers this past weekend and his weight increases. He continues to smoke marijuana daily. Does not smoke cigarettes.   OPTIVOL: Below threshold, fluid status stable, but on the rise likely in the setting of dietary noncompliance this weekend.   Past Medical History:  Diagnosis Date  . CAD (coronary artery disease)    Mild nonobstructive (Cath 09)  . Chronic systolic heart failure (HCC)    Nonischemic CM: echo 4/12 with EF 25-30%, grade 2 diast dysfxn, mild dilated aortic root 43 mm, trivial MR, mod LAE  . COPD (chronic obstructive pulmonary disease) (HCC)   . Dyspnea   . H/O ETOH abuse   . HTN (hypertension)   . Hypersomnia   . Obesity, morbid (HCC)   . OSA on CPAP   . Systolic and diastolic CHF,  acute on chronic (HCC) 11/2016  . Tobacco use disorder   . Ventricular tachycardia [facility])     Current Outpatient Prescriptions  Medication Sig Dispense Refill  . acetaminophen (TYLENOL) 325 MG tablet Take 2 tablets (650 mg total) by mouth every 4 (four) hours as needed for headache or mild pain.    Marland Kitchen albuterol (PROVENTIL HFA;VENTOLIN HFA) 108 (90 Base) MCG/ACT inhaler Inhale 1 puff into the lungs every 6 (six) hours as needed for wheezing or shortness of breath. 1 Inhaler 1  . amiodarone (PACERONE) 200 MG tablet Take 1 tablet (200 mg total) by mouth daily. 30 tablet 0  . aspirin 81 MG chewable tablet Chew 81 mg by mouth daily.    Marland Kitchen atorvastatin (LIPITOR) 80 MG tablet Take 1 tablet (80 mg total) by mouth daily at 6 PM. 30 tablet 0  . carvedilol (COREG) 3.125 MG tablet Take 3.125 mg by mouth 2 (two) times daily with a meal.    . FLUoxetine (PROZAC) 20 MG tablet Take 20 mg by mouth daily.    . pantoprazole (PROTONIX) 40 MG tablet Take 1 tablet (40 mg total) by mouth daily. 30 tablet 0  . potassium chloride SA (KLOR-CON M20) 20 MEQ tablet Take 2 tablets (40 mEq total) by mouth 2 (two) times daily. 90 tablet 3  . rivaroxaban (XARELTO) 20 MG TABS tablet Take 1 tablet (20 mg total) by mouth daily with supper. 30 tablet  0  . sacubitril-valsartan (ENTRESTO) 24-26 MG Take 1 tablet by mouth 2 (two) times daily. 60 tablet 11  . spironolactone (ALDACTONE) 25 MG tablet Take 1 tablet (25 mg total) by mouth daily. 30 tablet 0  . torsemide (DEMADEX) 20 MG tablet Take 60 mg by mouth 2 (two) times daily.     No current facility-administered medications for this encounter.     No Known Allergies    Social History   Social History  . Marital status: Divorced    Spouse name: N/A  . Number of children: 2  . Years of education: N/A   Occupational History  . scrap metal   . disabled, odd jobs    Social History Main Topics  . Smoking status: Former Smoker    Packs/day: 0.50    Years: 4.00    Types:  Cigarettes  . Smokeless tobacco: Never Used     Comment: about a pack a week. social  . Alcohol use 25.2 oz/week    42 Cans of beer per week     Comment: 6 beers per day  . Drug use: Yes    Frequency: 7.0 times per week    Types: Codeine, Marijuana     Comment: has discontinued  . Sexual activity: Yes    Partners: Female   Other Topics Concern  . Not on file   Social History Narrative   ** Merged History Encounter **          Family History  Problem Relation Age of Onset  . Other Mother        cardiac surgery. late 1990s  . Heart Problems Mother        CABG AGE 1  . Congestive Heart Failure Father   . Healthy Father        AGE 33  . Coronary artery disease Unknown   . Healthy Brother        AGE 37  . Healthy Sister        AGE 66  . Healthy Sister        AGE 77  . Healthy Son   . Healthy Son   . Healthy Daughter     Vitals:   03/05/17 1421  BP: 124/68  Pulse: 64  SpO2: 95%  Weight: (!) 301 lb 9.6 oz (136.8 kg)   Filed Weights   03/05/17 1421  Weight: (!) 301 lb 9.6 oz (136.8 kg)    PHYSICAL EXAM: General: Male, NAD. Walked into clinic without difficulty.    HEENT: Normal. Atraumatic  Neck: supple. 8 cm JVD.. Carotids 2+ bilat; no bruits. No lymphadenopathy or thyromegaly appreciated. Cor: PMI nondisplaced. Regular rate and rhythm. No rubs, gallops or murmurs. Lungs: Clear bilaterally. Normal effort.  Abdomen: soft, nontender, slightly distended. No hepatosplenomegaly. No bruits or masses. Bowel sounds present.  Extremities: no cyanosis, clubbing, rash. No peripheral edema.  Neuro: alert & oriented x 3, cranial nerves grossly intact. moves all 4 extremities w/o difficulty. Affect pleasant.   ASSESSMENT & PLAN: 1. Chronic combined systolic and diastolic CHF: NICM, Echo 12/2016 EF 15%. Normal cors in July 2016. ETOH vs. Hypertension - NYHA II - Volume status slightly elevated today likely due to the high salt diet this weekend and he drank beer.  -  Continue torsemide 60mg  BID. Take an extra 20mg  torsemide if weight increases by more than 3 pounds in one day or 5 pounds in one week.   - Increase Entresto to 49/51 mg BID. BMET in 10  days.  - Has Bi-V ICD, follows with Dr. Graciela Husbands - Continue Cleda Daub 25mg  daily.  - Continue Coreg 3.125 mg BID.  - Discussed the importance of dietary compliance, eating low salt foods. Reinforced fluid restrictions, he has started drinking again, I advised him to limit alcohol intake.  2. PAF - Continue Amiodarone, he was advised to get a yearly eye exam.  - LFT's ok in May 2018.  - TSH 1.1 in April 2018. -This patients CHA2DS2-VASc Score and unadjusted Ischemic Stroke Rate (% per year) is equal to 2.2 % stroke rate/year from a score of 2 Above score calculated as 1 point each if present [CHF, HTN, DM, Vascular=MI/PAD/Aortic Plaque, Age if 65-74, or Male], 2 points each if present [Age > 75, or Stroke/TIA/TE] - Continue Xarelto for anticoagulation.  - Denies melena and hematochezia  3. ETOH abuse - Started drinking again.  4. Tobacco abuse - Continued cessation  5. Substance abuse - Continues to smoke marijuana daily.  - he is not interested in cessation.   BMET in 10 days, follow up in 2 months. He will need an Echo in October.    [name], NP 03/05/17

## 2017-03-05 NOTE — Progress Notes (Signed)
Paramedicine Encounter   Patient ID: [name] [name] , male,   DOB: 1960-12-12,56 y.o.,  MRN: [id number]   Met Mr Stroebel in the clinic today with provider and he reports feeling well. Increasing Entresto to 49/51 BID. Per Junie Panning (NP) Mr Debroux should take an extra 20 mg of torsemide if weight increases over 300 lbs. Time spent with patient 35 min.   Jacquiline Doe, EMT-Paramedic 03/05/2017  ACTION: Home visit completed Next visit planned for 1 week

## 2017-03-15 ENCOUNTER — Ambulatory Visit (HOSPITAL_COMMUNITY)
Admission: RE | Admit: 2017-03-15 | Discharge: 2017-03-15 | Disposition: A | Discharge status: Home / Self Care | Payer: Medicaid Other | Source: Ambulatory Visit | Attending: Cardiology | Admitting: Cardiology

## 2017-03-15 DIAGNOSIS — I5022 Chronic systolic (congestive) heart failure: Secondary | ICD-10-CM | POA: Diagnosis present

## 2017-03-15 LAB — BASIC METABOLIC PANEL
ANION GAP: 5 (ref 5–15)
BUN: 11 mg/dL (ref 6–20)
CALCIUM: 9.3 mg/dL (ref 8.9–10.3)
CO2: 33 mmol/L — AB (ref 22–32)
CREATININE: 0.89 mg/dL (ref 0.61–1.24)
Chloride: 99 mmol/L — ABNORMAL LOW (ref 101–111)
GLUCOSE: 110 mg/dL — AB (ref 65–99)
Potassium: 3.9 mmol/L (ref 3.5–5.1)
Sodium: 137 mmol/L (ref 135–145)

## 2017-03-16 ENCOUNTER — Other Ambulatory Visit (HOSPITAL_COMMUNITY): Payer: Self-pay

## 2017-03-16 ENCOUNTER — Other Ambulatory Visit (HOSPITAL_COMMUNITY): Payer: Self-pay | Admitting: *Deleted

## 2017-03-16 MED ORDER — CARVEDILOL 3.125 MG PO TABS
3.1250 mg | ORAL_TABLET | Freq: Two times a day (BID) | ORAL | 3 refills | Status: DC
Start: 2017-03-16 — End: 2017-04-20

## 2017-03-16 NOTE — Progress Notes (Signed)
Paramedicine Encounter    Patient ID: [name] [name], male    DOB: Sep 23, 1960, 56 y.o.   MRN: [id number]   Patient Care Team: [name], MD as PCP - General (Cardiology) [name], MD (Family Medicine)  Patient Active Problem List   Diagnosis Date Noted  . AKI (acute kidney injury) (HCC)   . Acute on chronic systolic CHF (congestive heart failure) (HCC) 01/29/2017  . HTN (hypertension) 12/25/2016  . Obesity 12/25/2016  . OSA (obstructive sleep apnea) 12/25/2016  . Hyperglycemia 12/25/2016  . Acute respiratory failure with hypoxia (HCC) 12/25/2016  . Chronic anticoagulation 06/27/2016  . Encounter for therapeutic drug monitoring 04/19/2015  . S/P ICD (internal cardiac defibrillator) procedure Medtronic CRT ICD 04/15/2015  . Atrial fibrillation with RVR - Paroxysmal 04/08/2015  . NICM- EF 25% 2012 & 20-25% 2016 04/06/2015  . Troponin level elevated 04/06/2015  . Rectal bleeding 10/26/2011  . Hydrocele, left 05/24/2011  . Tobacco use disorder- 1/2 pk a day   . H/O ETOH abuse 6-8 beers a day   . Obesity 09/27/2008  . CAD- minor CAD 2009 and 2016 09/27/2008  . VENTRICULAR TACHYCARDIA 09/27/2008  . Essential hypertension 06/19/2008  . Chronic systolic heart failure (HCC) 06/19/2008    Current Outpatient Prescriptions:  .  acetaminophen (TYLENOL) 325 MG tablet, Take 2 tablets (650 mg total) by mouth every 4 (four) hours as needed for headache or mild pain., Disp: , Rfl:  .  albuterol (PROVENTIL HFA;VENTOLIN HFA) 108 (90 Base) MCG/ACT inhaler, Inhale 1 puff into the lungs every 6 (six) hours as needed for wheezing or shortness of breath., Disp: 1 Inhaler, Rfl: 1 .  amiodarone (PACERONE) 200 MG tablet, Take 1 tablet (200 mg total) by mouth daily., Disp: 30 tablet, Rfl: 0 .  aspirin 81 MG chewable tablet, Chew 81 mg by mouth daily., Disp: , Rfl:  .  atorvastatin (LIPITOR) 80 MG tablet, Take 1 tablet (80 mg total) by mouth daily at 6 PM., Disp: 30 tablet, Rfl: 0 .   carvedilol (COREG) 3.125 MG tablet, Take 3.125 mg by mouth 2 (two) times daily with a meal., Disp: , Rfl:  .  FLUoxetine (PROZAC) 20 MG tablet, Take 20 mg by mouth daily., Disp: , Rfl:  .  pantoprazole (PROTONIX) 40 MG tablet, Take 1 tablet (40 mg total) by mouth daily., Disp: 30 tablet, Rfl: 0 .  potassium chloride SA (KLOR-CON M20) 20 MEQ tablet, Take 2 tablets (40 mEq total) by mouth 2 (two) times daily., Disp: 90 tablet, Rfl: 3 .  rivaroxaban (XARELTO) 20 MG TABS tablet, Take 1 tablet (20 mg total) by mouth daily with supper., Disp: 30 tablet, Rfl: 0 .  sacubitril-valsartan (ENTRESTO) 49-51 MG, Take 1 tablet by mouth 2 (two) times daily., Disp: 60 tablet, Rfl: 6 .  spironolactone (ALDACTONE) 25 MG tablet, Take 1 tablet (25 mg total) by mouth daily., Disp: 30 tablet, Rfl: 0 .  torsemide (DEMADEX) 20 MG tablet, Take 60 mg by mouth 2 (two) times daily., Disp: , Rfl:  No Known Allergies   Social History   Social History  . Marital status: Divorced    Spouse name: N/A  . Number of children: 2  . Years of education: N/A   Occupational History  . scrap metal   . disabled, odd jobs    Social History Main Topics  . Smoking status: Former Smoker    Packs/day: 0.50    Years: 4.00    Types: Cigarettes  . Smokeless tobacco: Never Used  Comment: about a pack a week. social  . Alcohol use 25.2 oz/week    42 Cans of beer per week     Comment: 6 beers per day  . Drug use: Yes    Frequency: 7.0 times per week    Types: Codeine, Marijuana     Comment: has discontinued  . Sexual activity: Yes    Partners: Female   Other Topics Concern  . Not on file   Social History Narrative   ** Merged History Encounter **        Physical Exam  Constitutional: He is oriented to person, place, and time. He appears well-developed.  Neck: Normal range of motion.  Cardiovascular: Normal rate and regular rhythm.   Pulmonary/Chest: Effort normal and breath sounds normal. No respiratory distress. He  has no wheezes. He has no rales.  Abdominal: Soft. He exhibits no distension.  Musculoskeletal: Normal range of motion. He exhibits no edema.  Neurological: He is alert and oriented to person, place, and time.  Skin: Skin is warm and dry.  Psychiatric: He has a normal mood and affect.        SAFE - 02/26/17 1500      Situation   Admitting diagnosis CHF   Heart failure history Exisiting   Comorbidities Atrial fibillation;HTN;Sleep Apnea   Readmitted within 30 days Yes   Hospital admission within past 12 months Yes   number of hospital admissions 2   number of ED visits 2     Assessment   Lives alone No   Primary support person [name] [name]   Mode of transportation personal car   Other services involved None   Home equipement Scale     Weight   Weighs self daily Yes   Scale provided No   Records on weight chart Yes     Resources   Has "Living better w/heart failure" book Yes   Has HF Zone tool Yes   Able to identify yellow zone signs/when to call MD Yes   Records zone daily Yes     Medications   Uses a pill box No   Difficulty obtaining medications No   Mail order medications Yes   New medications at home today No   Which ones xarelto, entresto   Next scheduled delivery of medications 03/18/17     Nutrition   Patient receives meals on wheels No   Patient follows low sodium diet Yes   Has foods at home that meet the current recommended diet Yes   Patient follows low sugar/card diet Yes   Nutritional concerns/issues No     Activity Level   ADL's/Mobility Independent   How many feet can patient ambulate >50'   Typical activity level Active   Barriers None     Urine   Difficulty urinating No   Changes in urine None     Time spent with patient   Time spent with patient  60 Minutes        Future Appointments Date Time Provider Department Center  05/07/2017 10:30 AM [facility] PA/NP [facility] None   BP 100/68 (BP Location: Left Arm, Patient Position: Sitting,  Cuff Size: Normal)   Pulse 77   Resp 16   Wt 296 lb (134.3 kg)   SpO2 98%   BMI 36.03 kg/m  Weight yesterday- 296 lbs Last visit weight- 296 lbs  [name] [name] was seen at home today and reports feeling well. He denied SOB and headache but said he has dizziness when  standing up quickly but it passes quickly. His medications were verified and Carvedilol and pantoprazole wee ordered. [name] [name] advised he ran out of carvedilol today but has not missed a dose. Xarelto, torsemide and atorvastatin need to be ordered at the beginning of next week. [name] [name] stated he would be able to call in these refills. His pillbox was filled and a visit was planned for next week.   Jacqualine Code, EMT 03/16/17  ACTION: Home visit completed Next visit planned for 1 week

## 2017-03-26 ENCOUNTER — Other Ambulatory Visit (HOSPITAL_COMMUNITY): Payer: Self-pay

## 2017-03-26 ENCOUNTER — Other Ambulatory Visit: Payer: Self-pay | Admitting: Internal Medicine

## 2017-03-26 MED FILL — ATORVASTATIN 80 MG TABLET: 80 | 34 days supply | Qty: 34 | Fill #1

## 2017-03-26 MED FILL — TORSEMIDE 20 MG TABLET: 20 | 34 days supply | Qty: 204 | Fill #1

## 2017-03-26 NOTE — Progress Notes (Signed)
Paramedicine Encounter    Patient ID: [name] [name], male    DOB: [birth year], 56 y.o.   MRN: [id number]   Patient Care Team: [name], MD as PCP - General (Cardiology) [name], MD (Family Medicine)  Patient Active Problem List   Diagnosis Date Noted  . AKI (acute kidney injury) (HCC)   . Acute on chronic systolic CHF (congestive heart failure) (HCC) 01/29/2017  . HTN (hypertension) 12/25/2016  . Obesity 12/25/2016  . OSA (obstructive sleep apnea) 12/25/2016  . Hyperglycemia 12/25/2016  . Acute respiratory failure with hypoxia (HCC) 12/25/2016  . Chronic anticoagulation 06/27/2016  . Encounter for therapeutic drug monitoring 04/19/2015  . S/P ICD (internal cardiac defibrillator) procedure Medtronic CRT ICD 04/15/2015  . Atrial fibrillation with RVR - Paroxysmal 04/08/2015  . NICM- EF 25% 2012 & 20-25% 2016 04/06/2015  . Troponin level elevated 04/06/2015  . Rectal bleeding 10/26/2011  . Hydrocele, left 05/24/2011  . Tobacco use disorder- 1/2 pk a day   . H/O ETOH abuse 6-8 beers a day   . Obesity 09/27/2008  . CAD- minor CAD 2009 and 2016 09/27/2008  . VENTRICULAR TACHYCARDIA 09/27/2008  . Essential hypertension 06/19/2008  . Chronic systolic heart failure (HCC) 06/19/2008    Current Outpatient Prescriptions:  .  acetaminophen (TYLENOL) 325 MG tablet, Take 2 tablets (650 mg total) by mouth every 4 (four) hours as needed for headache or mild pain., Disp: , Rfl:  .  albuterol (PROVENTIL HFA;VENTOLIN HFA) 108 (90 Base) MCG/ACT inhaler, Inhale 1 puff into the lungs every 6 (six) hours as needed for wheezing or shortness of breath., Disp: 1 Inhaler, Rfl: 1 .  amiodarone (PACERONE) 200 MG tablet, Take 1 tablet (200 mg total) by mouth daily., Disp: 30 tablet, Rfl: 0 .  aspirin 81 MG chewable tablet, Chew 81 mg by mouth daily., Disp: , Rfl:  .  atorvastatin (LIPITOR) 80 MG tablet, Take 1 tablet (80 mg total) by mouth daily at 6 PM., Disp: 30 tablet, Rfl: 0 .   carvedilol (COREG) 3.125 MG tablet, Take 1 tablet (3.125 mg total) by mouth 2 (two) times daily with a meal., Disp: 60 tablet, Rfl: 3 .  FLUoxetine (PROZAC) 20 MG tablet, Take 20 mg by mouth daily., Disp: , Rfl:  .  pantoprazole (PROTONIX) 40 MG tablet, Take 1 tablet (40 mg total) by mouth daily., Disp: 30 tablet, Rfl: 0 .  potassium chloride SA (KLOR-CON M20) 20 MEQ tablet, Take 2 tablets (40 mEq total) by mouth 2 (two) times daily., Disp: 90 tablet, Rfl: 3 .  rivaroxaban (XARELTO) 20 MG TABS tablet, Take 1 tablet (20 mg total) by mouth daily with supper., Disp: 30 tablet, Rfl: 0 .  sacubitril-valsartan (ENTRESTO) 49-51 MG, Take 1 tablet by mouth 2 (two) times daily., Disp: 60 tablet, Rfl: 6 .  spironolactone (ALDACTONE) 25 MG tablet, Take 1 tablet (25 mg total) by mouth daily., Disp: 30 tablet, Rfl: 0 .  torsemide (DEMADEX) 20 MG tablet, Take 60 mg by mouth 2 (two) times daily., Disp: , Rfl:  No Known Allergies   Social History   Social History  . Marital status: Divorced    Spouse name: N/A  . Number of children: 2  . Years of education: N/A   Occupational History  . scrap metal   . disabled, odd jobs    Social History Main Topics  . Smoking status: Former Smoker    Packs/day: 0.50    Years: 4.00    Types: Cigarettes  . Smokeless  tobacco: Never Used     Comment: about a pack a week. social  . Alcohol use 25.2 oz/week    42 Cans of beer per week     Comment: 6 beers per day  . Drug use: Yes    Frequency: 7.0 times per week    Types: Codeine, Marijuana     Comment: has discontinued  . Sexual activity: Yes    Partners: Female   Other Topics Concern  . Not on file   Social History Narrative   ** Merged History Encounter **        Physical Exam  Constitutional: He is oriented to person, place, and time. He appears well-developed.  Neck: Normal range of motion.  Cardiovascular: Normal rate.   Pulmonary/Chest: Effort normal and breath sounds normal. No respiratory  distress. He has no wheezes. He has no rales.  Abdominal: Soft. He exhibits no distension.  Musculoskeletal: Normal range of motion. He exhibits no edema.  Neurological: He is alert and oriented to person, place, and time.  Skin: Skin is warm and dry. No erythema.        SAFE - 02/26/17 1500      Situation   Admitting diagnosis CHF   Heart failure history Exisiting   Comorbidities Atrial fibillation;HTN;Sleep Apnea   Readmitted within 30 days Yes   Hospital admission within past 12 months Yes   number of hospital admissions 2   number of ED visits 2     Assessment   Lives alone No   Primary support person Dede Susette Racer   Mode of transportation personal car   Other services involved None   Home equipement Scale     Weight   Weighs self daily Yes   Scale provided No   Records on weight chart Yes     Resources   Has "Living better w/heart failure" book Yes   Has HF Zone tool Yes   Able to identify yellow zone signs/when to call MD Yes   Records zone daily Yes     Medications   Uses a pill box No   Difficulty obtaining medications No   Mail order medications Yes   New medications at home today No   Which ones xarelto, entresto   Next scheduled delivery of medications 03/18/17     Nutrition   Patient receives meals on wheels No   Patient follows low sodium diet Yes   Has foods at home that meet the current recommended diet Yes   Patient follows low sugar/card diet Yes   Nutritional concerns/issues No     Activity Level   ADL's/Mobility Independent   How many feet can patient ambulate >50'   Typical activity level Active   Barriers None     Urine   Difficulty urinating No   Changes in urine None     Time spent with patient   Time spent with patient  60 Minutes        Future Appointments Date Time Provider Department Center  05/07/2017 10:30 AM [facility] PA/NP [facility] None   BP 100/68 (BP Location: Left Arm, Patient Position: Sitting, Cuff Size: Normal)    Pulse 72   Resp 16   Wt 298 lb (135.2 kg)   SpO2 98%   BMI 36.27 kg/m  Weight yesterday- 296 lbs Last visit weight- 296 lbs  Mr Denning was seen at home today and reports feeling well. He is having increased energy and is able to walk longer distances without becoming  SOB. He denied SOB with ADL's, headache or dizziness and is only using one pillow to sleep at night. He is compliant with all medications. He has torsemide and atorvastatin ready for pickup at the outpatient pharmacy. Xarelto was also ordered at the [facility] pharmacy and will be ready for pick up in 24 hours.   Jacqualine Code, EMT 03/26/17  ACTION: Home visit completed Next visit planned for 1 week

## 2017-04-02 ENCOUNTER — Ambulatory Visit (INDEPENDENT_AMBULATORY_CARE_PROVIDER_SITE_OTHER): Payer: Medicaid Other | Admitting: *Deleted

## 2017-04-02 ENCOUNTER — Other Ambulatory Visit (HOSPITAL_COMMUNITY): Payer: Self-pay

## 2017-04-02 DIAGNOSIS — I428 Other cardiomyopathies: Secondary | ICD-10-CM | POA: Diagnosis not present

## 2017-04-02 NOTE — Progress Notes (Signed)
Remote ICD transmission.   

## 2017-04-02 NOTE — Progress Notes (Signed)
Paramedicine Encounter    Patient ID: [name] [name], male    DOB: [birth year], 56 y.o.   MRN: [id number]   Patient Care Team: [name], MD as PCP - General (Cardiology) [name], MD (Family Medicine)  Patient Active Problem List   Diagnosis Date Noted  . AKI (acute kidney injury) (HCC)   . Acute on chronic systolic CHF (congestive heart failure) (HCC) 01/29/2017  . HTN (hypertension) 12/25/2016  . Obesity 12/25/2016  . OSA (obstructive sleep apnea) 12/25/2016  . Hyperglycemia 12/25/2016  . Acute respiratory failure with hypoxia (HCC) 12/25/2016  . Chronic anticoagulation 06/27/2016  . Encounter for therapeutic drug monitoring 04/19/2015  . S/P ICD (internal cardiac defibrillator) procedure Medtronic CRT ICD 04/15/2015  . Atrial fibrillation with RVR - Paroxysmal 04/08/2015  . NICM- EF 25% 2012 & 20-25% 2016 04/06/2015  . Troponin level elevated 04/06/2015  . Rectal bleeding 10/26/2011  . Hydrocele, left 05/24/2011  . Tobacco use disorder- 1/2 pk a day   . H/O ETOH abuse 6-8 beers a day   . Obesity 09/27/2008  . CAD- minor CAD 2009 and 2016 09/27/2008  . VENTRICULAR TACHYCARDIA 09/27/2008  . Essential hypertension 06/19/2008  . Chronic systolic heart failure (HCC) 06/19/2008    Current Outpatient Prescriptions:  .  acetaminophen (TYLENOL) 325 MG tablet, Take 2 tablets (650 mg total) by mouth every 4 (four) hours as needed for headache or mild pain., Disp: , Rfl:  .  albuterol (PROVENTIL HFA;VENTOLIN HFA) 108 (90 Base) MCG/ACT inhaler, Inhale 1 puff into the lungs every 6 (six) hours as needed for wheezing or shortness of breath., Disp: 1 Inhaler, Rfl: 1 .  amiodarone (PACERONE) 200 MG tablet, Take 1 tablet (200 mg total) by mouth daily., Disp: 30 tablet, Rfl: 0 .  aspirin 81 MG chewable tablet, Chew 81 mg by mouth daily., Disp: , Rfl:  .  atorvastatin (LIPITOR) 80 MG tablet, Take 1 tablet (80 mg total) by mouth daily at 6 PM., Disp: 30 tablet, Rfl: 0 .   carvedilol (COREG) 3.125 MG tablet, Take 1 tablet (3.125 mg total) by mouth 2 (two) times daily with a meal., Disp: 60 tablet, Rfl: 3 .  FLUoxetine (PROZAC) 20 MG tablet, Take 20 mg by mouth daily., Disp: , Rfl:  .  pantoprazole (PROTONIX) 40 MG tablet, Take 1 tablet (40 mg total) by mouth daily., Disp: 30 tablet, Rfl: 0 .  potassium chloride SA (KLOR-CON M20) 20 MEQ tablet, Take 2 tablets (40 mEq total) by mouth 2 (two) times daily., Disp: 90 tablet, Rfl: 3 .  rivaroxaban (XARELTO) 20 MG TABS tablet, Take 1 tablet (20 mg total) by mouth daily with supper., Disp: 30 tablet, Rfl: 0 .  sacubitril-valsartan (ENTRESTO) 49-51 MG, Take 1 tablet by mouth 2 (two) times daily., Disp: 60 tablet, Rfl: 6 .  spironolactone (ALDACTONE) 25 MG tablet, Take 1 tablet (25 mg total) by mouth daily., Disp: 30 tablet, Rfl: 0 .  torsemide (DEMADEX) 20 MG tablet, Take 60 mg by mouth 2 (two) times daily., Disp: , Rfl:  No Known Allergies   Social History   Social History  . Marital status: Divorced    Spouse name: N/A  . Number of children: 2  . Years of education: N/A   Occupational History  . scrap metal   . disabled, odd jobs    Social History Main Topics  . Smoking status: Former Smoker    Packs/day: 0.50    Years: 4.00    Types: Cigarettes  . Smokeless  tobacco: Never Used     Comment: about a pack a week. social  . Alcohol use 25.2 oz/week    42 Cans of beer per week     Comment: 6 beers per day  . Drug use: Yes    Frequency: 7.0 times per week    Types: Codeine, Marijuana     Comment: has discontinued  . Sexual activity: Yes    Partners: Female   Other Topics Concern  . Not on file   Social History Narrative   ** Merged History Encounter **        Physical Exam  Constitutional: He is oriented to person, place, and time. He appears well-developed.  Neck: Normal range of motion. No JVD present.  Cardiovascular: Normal rate.   Pulmonary/Chest: Effort normal and breath sounds normal. No  respiratory distress. He has no wheezes. He has no rales.  Abdominal: Soft. Bowel sounds are normal. He exhibits no distension. There is no tenderness. There is no rebound.  Musculoskeletal: Normal range of motion. He exhibits no edema.  Neurological: He is alert and oriented to person, place, and time.  Skin: Skin is warm and dry.  Psychiatric: He has a normal mood and affect.        SAFE - 02/26/17 1500      Situation   Admitting diagnosis CHF   Heart failure history Exisiting   Comorbidities Atrial fibillation;HTN;Sleep Apnea   Readmitted within 30 days Yes   Hospital admission within past 12 months Yes   number of hospital admissions 2   number of ED visits 2     Assessment   Lives alone No   Primary support person Dede Susette Racer   Mode of transportation personal car   Other services involved None   Home equipement Scale     Weight   Weighs self daily Yes   Scale provided No   Records on weight chart Yes     Resources   Has "Living better w/heart failure" book Yes   Has HF Zone tool Yes   Able to identify yellow zone signs/when to call MD Yes   Records zone daily Yes     Medications   Uses a pill box No   Difficulty obtaining medications No   Mail order medications Yes   New medications at home today No   Which ones xarelto, entresto   Next scheduled delivery of medications 03/18/17     Nutrition   Patient receives meals on wheels No   Patient follows low sodium diet Yes   Has foods at home that meet the current recommended diet Yes   Patient follows low sugar/card diet Yes   Nutritional concerns/issues No     Activity Level   ADL's/Mobility Independent   How many feet can patient ambulate >50'   Typical activity level Active   Barriers None     Urine   Difficulty urinating No   Changes in urine None     Time spent with patient   Time spent with patient  60 Minutes        Future Appointments Date Time Provider Department Center  05/07/2017 10:30  AM [facility] PA/NP [facility] None  07/02/2017 9:40 AM [facility] DEVICE REMOTES [facility]   BP 126/70 (BP Location: Left Arm, Patient Position: Sitting, Cuff Size: Normal)   Pulse 70   Resp 16   Wt 300 lb (136.1 kg)   SpO2 97%   BMI 36.52 kg/m  Weight yesterday- 299 lbs  Last visit weight- 298 lbs  Mr Pelon was seen at home today and reports feeling well. He continues to do work around his house with little limitations. He has swelling in his left elbow that has been an intermittent concern for the past few week but it is not causing any pain. He had attempted to fill his own pillbox but several medications were not correct. I made the corrections as I verified his medications and ordered the medications he needed. He has no complaints at this time and I will see him next week.  Jacqualine Code, EMT 04/02/17  ACTION: Home visit completed Next visit planned for 1 week

## 2017-04-03 LAB — CUP PACEART REMOTE DEVICE CHECK
Battery Remaining Longevity: 68 mo
Battery Voltage: 2.97 V
Brady Statistic RA Percent Paced: 18.09 %
Brady Statistic RV Percent Paced: 94.12 %
Date Time Interrogation Session: 20180716075552
HighPow Impedance: 59 Ohm
Implantable Lead Implant Date: 20160727
Implantable Lead Location: 753858
Implantable Lead Location: 753859
Implantable Lead Model: 4598
Implantable Lead Model: 6935
Implantable Pulse Generator Implant Date: 20160727
Lead Channel Impedance Value: 342 Ohm
Lead Channel Impedance Value: 361 Ohm
Lead Channel Impedance Value: 399 Ohm
Lead Channel Impedance Value: 399 Ohm
Lead Channel Impedance Value: 456 Ohm
Lead Channel Impedance Value: 532 Ohm
Lead Channel Impedance Value: 532 Ohm
Lead Channel Impedance Value: 551 Ohm
Lead Channel Sensing Intrinsic Amplitude: 1.75 mV
Lead Channel Sensing Intrinsic Amplitude: 13.75 mV
Lead Channel Sensing Intrinsic Amplitude: 13.75 mV
Lead Channel Setting Pacing Amplitude: 2.25 V
Lead Channel Setting Pacing Pulse Width: 0.4 ms
Lead Channel Setting Pacing Pulse Width: 0.6 ms
MDC IDC LEAD IMPLANT DT: 20160727
MDC IDC LEAD IMPLANT DT: 20160727
MDC IDC LEAD LOCATION: 753860
MDC IDC MSMT LEADCHNL LV IMPEDANCE VALUE: 304 Ohm
MDC IDC MSMT LEADCHNL LV IMPEDANCE VALUE: 342 Ohm
MDC IDC MSMT LEADCHNL LV IMPEDANCE VALUE: 551 Ohm
MDC IDC MSMT LEADCHNL LV IMPEDANCE VALUE: 589 Ohm
MDC IDC MSMT LEADCHNL LV PACING THRESHOLD AMPLITUDE: 1.25 V
MDC IDC MSMT LEADCHNL LV PACING THRESHOLD PULSEWIDTH: 0.6 ms
MDC IDC MSMT LEADCHNL RA IMPEDANCE VALUE: 456 Ohm
MDC IDC MSMT LEADCHNL RA PACING THRESHOLD AMPLITUDE: 0.5 V
MDC IDC MSMT LEADCHNL RA PACING THRESHOLD PULSEWIDTH: 0.4 ms
MDC IDC MSMT LEADCHNL RA SENSING INTR AMPL: 1.75 mV
MDC IDC MSMT LEADCHNL RV PACING THRESHOLD AMPLITUDE: 0.5 V
MDC IDC MSMT LEADCHNL RV PACING THRESHOLD PULSEWIDTH: 0.4 ms
MDC IDC SET LEADCHNL RA PACING AMPLITUDE: 1.5 V
MDC IDC SET LEADCHNL RV PACING AMPLITUDE: 2 V
MDC IDC SET LEADCHNL RV SENSING SENSITIVITY: 0.3 mV
MDC IDC STAT BRADY AP VP PERCENT: 18.14 %
MDC IDC STAT BRADY AP VS PERCENT: 0.09 %
MDC IDC STAT BRADY AS VP PERCENT: 77.62 %
MDC IDC STAT BRADY AS VS PERCENT: 4.15 %

## 2017-04-04 ENCOUNTER — Encounter: Payer: Self-pay | Admitting: Cardiology

## 2017-04-05 ENCOUNTER — Telehealth (HOSPITAL_COMMUNITY): Payer: Self-pay | Admitting: Pharmacist

## 2017-04-05 NOTE — Telephone Encounter (Signed)
Entresto PA approved by [place] [employer] through 03/30/18.   Tyler Deis. Bonnye Fava, PharmD, BCPS, CPP Clinical Pharmacist Pager: [phone] Phone: [phone] 04/05/2017 3:15 PM

## 2017-04-09 ENCOUNTER — Other Ambulatory Visit (HOSPITAL_COMMUNITY): Payer: Self-pay

## 2017-04-09 ENCOUNTER — Telehealth (HOSPITAL_COMMUNITY): Payer: Self-pay | Admitting: *Deleted

## 2017-04-09 ENCOUNTER — Other Ambulatory Visit: Payer: Self-pay | Admitting: Oncology

## 2017-04-09 NOTE — Telephone Encounter (Signed)
[name] [name] with Paramed called to report patient's BP while visiting him was 84/60 but patient is asymptomatic.  Spoke with [name], NP and she advises patient to hold Coreg for 2 days and then restart it have have [name] [name] recheck BP when he visits patient on Friday.  [name] [name] is aware and will instruct patient.

## 2017-04-09 NOTE — Progress Notes (Signed)
Paramedicine Encounter    Patient ID: [name] [name], male    DOB: [birth year], 56 y.o.   MRN: [id number]   Patient Care Team: [name], MD as PCP - General (Cardiology) [name], MD (Family Medicine)  Patient Active Problem List   Diagnosis Date Noted  . AKI (acute kidney injury) (HCC)   . Acute on chronic systolic CHF (congestive heart failure) (HCC) 01/29/2017  . HTN (hypertension) 12/25/2016  . Obesity 12/25/2016  . OSA (obstructive sleep apnea) 12/25/2016  . Hyperglycemia 12/25/2016  . Acute respiratory failure with hypoxia (HCC) 12/25/2016  . Chronic anticoagulation 06/27/2016  . Encounter for therapeutic drug monitoring 04/19/2015  . S/P ICD (internal cardiac defibrillator) procedure Medtronic CRT ICD 04/15/2015  . Atrial fibrillation with RVR - Paroxysmal 04/08/2015  . NICM- EF 25% 2012 & 20-25% 2016 04/06/2015  . Troponin level elevated 04/06/2015  . Rectal bleeding 10/26/2011  . Hydrocele, left 05/24/2011  . Tobacco use disorder- 1/2 pk a day   . H/O ETOH abuse 6-8 beers a day   . Obesity 09/27/2008  . CAD- minor CAD 2009 and 2016 09/27/2008  . VENTRICULAR TACHYCARDIA 09/27/2008  . Essential hypertension 06/19/2008  . Chronic systolic heart failure (HCC) 06/19/2008    Current Outpatient Prescriptions:  .  acetaminophen (TYLENOL) 325 MG tablet, Take 2 tablets (650 mg total) by mouth every 4 (four) hours as needed for headache or mild pain., Disp: , Rfl:  .  albuterol (PROVENTIL HFA;VENTOLIN HFA) 108 (90 Base) MCG/ACT inhaler, Inhale 1 puff into the lungs every 6 (six) hours as needed for wheezing or shortness of breath., Disp: 1 Inhaler, Rfl: 1 .  amiodarone (PACERONE) 200 MG tablet, Take 1 tablet (200 mg total) by mouth daily., Disp: 30 tablet, Rfl: 0 .  aspirin 81 MG chewable tablet, Chew 81 mg by mouth daily., Disp: , Rfl:  .  atorvastatin (LIPITOR) 80 MG tablet, Take 1 tablet (80 mg total) by mouth daily at 6 PM., Disp: 30 tablet, Rfl: 0 .   carvedilol (COREG) 3.125 MG tablet, Take 1 tablet (3.125 mg total) by mouth 2 (two) times daily with a meal., Disp: 60 tablet, Rfl: 3 .  FLUoxetine (PROZAC) 20 MG tablet, Take 20 mg by mouth daily., Disp: , Rfl:  .  pantoprazole (PROTONIX) 40 MG tablet, Take 1 tablet (40 mg total) by mouth daily., Disp: 30 tablet, Rfl: 0 .  potassium chloride SA (KLOR-CON M20) 20 MEQ tablet, Take 2 tablets (40 mEq total) by mouth 2 (two) times daily., Disp: 90 tablet, Rfl: 3 .  rivaroxaban (XARELTO) 20 MG TABS tablet, Take 1 tablet (20 mg total) by mouth daily with supper., Disp: 30 tablet, Rfl: 0 .  sacubitril-valsartan (ENTRESTO) 49-51 MG, Take 1 tablet by mouth 2 (two) times daily., Disp: 60 tablet, Rfl: 6 .  spironolactone (ALDACTONE) 25 MG tablet, Take 1 tablet (25 mg total) by mouth daily., Disp: 30 tablet, Rfl: 0 .  torsemide (DEMADEX) 20 MG tablet, Take 60 mg by mouth 2 (two) times daily., Disp: , Rfl:  No Known Allergies   Social History   Social History  . Marital status: Divorced    Spouse name: N/A  . Number of children: 2  . Years of education: N/A   Occupational History  . scrap metal   . disabled, odd jobs    Social History Main Topics  . Smoking status: Former Smoker    Packs/day: 0.50    Years: 4.00    Types: Cigarettes  . Smokeless  tobacco: Never Used     Comment: about a pack a week. social  . Alcohol use 25.2 oz/week    42 Cans of beer per week     Comment: 6 beers per day  . Drug use: Yes    Frequency: 7.0 times per week    Types: Codeine, Marijuana     Comment: has discontinued  . Sexual activity: Yes    Partners: Female   Other Topics Concern  . Not on file   Social History Narrative   ** Merged History Encounter **        Physical Exam  Constitutional: He is oriented to person, place, and time. He appears well-developed.  Neck: Normal range of motion.  Cardiovascular: Normal rate and regular rhythm.   Pulmonary/Chest: Effort normal and breath sounds normal.  No respiratory distress. He has no wheezes.  Abdominal: Soft. He exhibits no distension.  Musculoskeletal: Normal range of motion. He exhibits no edema.  Neurological: He is alert and oriented to person, place, and time.  Skin: Skin is warm and dry.  Psychiatric: He has a normal mood and affect.        SAFE - 02/26/17 1500      Situation   Admitting diagnosis CHF   Heart failure history Exisiting   Comorbidities Atrial fibillation;HTN;Sleep Apnea   Readmitted within 30 days Yes   Hospital admission within past 12 months Yes   number of hospital admissions 2   number of ED visits 2     Assessment   Lives alone No   Primary support person Dede Susette Racer   Mode of transportation personal car   Other services involved None   Home equipement Scale     Weight   Weighs self daily Yes   Scale provided No   Records on weight chart Yes     Resources   Has "Living better w/heart failure" book Yes   Has HF Zone tool Yes   Able to identify yellow zone signs/when to call MD Yes   Records zone daily Yes     Medications   Uses a pill box No   Difficulty obtaining medications No   Mail order medications Yes   New medications at home today No   Which ones xarelto, entresto   Next scheduled delivery of medications 03/18/17     Nutrition   Patient receives meals on wheels No   Patient follows low sodium diet Yes   Has foods at home that meet the current recommended diet Yes   Patient follows low sugar/card diet Yes   Nutritional concerns/issues No     Activity Level   ADL's/Mobility Independent   How many feet can patient ambulate >50'   Typical activity level Active   Barriers None     Urine   Difficulty urinating No   Changes in urine None     Time spent with patient   Time spent with patient  60 Minutes        Future Appointments Date Time Provider Department Center  05/07/2017 10:30 AM [facility] PA/NP [facility] None  07/02/2017 9:40 AM [facility]OT[facility]   BP (!) 84/60 (BP Location: Left Arm, Patient Position: Sitting, Cuff Size: Normal)   Pulse 68   Resp 16   Wt 297 lb (134.7 kg)   SpO2 96%   BMI 36.15 kg/m  Weight yesterday- 296 lbs Last visit weight- 300 lbs  [name] was seen at home today and reported  feeling generally well. He denied SOB, H/A or orthopnea but admits to having intermittent dizziness if he bends over for too long and then stands up. His medications were verified and his pillbox was refilled. During assessment, Mr Stanton was found to be hypotensive at 84/60 mm/Hg. The clinic was contacted and advised me to hold carvedilol x 4 doses and recheck on Friday. No further was required at that time. Mr Lipa's next ose of carvedilol will be in Wednesday night's bin of his pillbox in accordance with the clinics orders.  Jacqualine Code, EMT 04/09/17  ACTION: Home visit completed Next visit planned for 1 week

## 2017-04-13 ENCOUNTER — Other Ambulatory Visit (HOSPITAL_COMMUNITY): Payer: Self-pay

## 2017-04-13 NOTE — Progress Notes (Signed)
Paramedicine Encounter    Patient ID: [name] [name], male    DOB: [birth year], 56 y.o.   MRN: [id number]   Patient Care Team: [name] Rotunda, MD as PCP - General (Cardiology) [name] Mask, MD (Family Medicine)  Patient Active Problem List   Diagnosis Date Noted  . AKI (acute kidney injury) (HCC)   . Acute on chronic systolic CHF (congestive heart failure) (HCC) 01/29/2017  . HTN (hypertension) 12/25/2016  . Obesity 12/25/2016  . OSA (obstructive sleep apnea) 12/25/2016  . Hyperglycemia 12/25/2016  . Acute respiratory failure with hypoxia (HCC) 12/25/2016  . Chronic anticoagulation 06/27/2016  . Encounter for therapeutic drug monitoring 04/19/2015  . S/P ICD (internal cardiac defibrillator) procedure Medtronic CRT ICD 04/15/2015  . Atrial fibrillation with RVR - Paroxysmal 04/08/2015  . NICM- EF 25% 2012 & 20-25% 2016 04/06/2015  . Troponin level elevated 04/06/2015  . Rectal bleeding 10/26/2011  . Hydrocele, left 05/24/2011  . Tobacco use disorder- 1/2 pk a day   . H/O ETOH abuse 6-8 beers a day   . Obesity 09/27/2008  . CAD- minor CAD 2009 and 2016 09/27/2008  . VENTRICULAR TACHYCARDIA 09/27/2008  . Essential hypertension 06/19/2008  . Chronic systolic heart failure (HCC) 06/19/2008    Current Outpatient Prescriptions:  .  acetaminophen (TYLENOL) 325 MG tablet, Take 2 tablets (650 mg total) by mouth every 4 (four) hours as needed for headache or mild pain., Disp: , Rfl:  .  albuterol (PROVENTIL HFA;VENTOLIN HFA) 108 (90 Base) MCG/ACT inhaler, Inhale 1 puff into the lungs every 6 (six) hours as needed for wheezing or shortness of breath., Disp: 1 Inhaler, Rfl: 1 .  amiodarone (PACERONE) 200 MG tablet, Take 1 tablet (200 mg total) by mouth daily., Disp: 30 tablet, Rfl: 0 .  aspirin 81 MG chewable tablet, Chew 81 mg by mouth daily., Disp: , Rfl:  .  atorvastatin (LIPITOR) 80 MG tablet, Take 1 tablet (80 mg total) by mouth daily at 6 PM., Disp: 30 tablet, Rfl: 0 .   carvedilol (COREG) 3.125 MG tablet, Take 1 tablet (3.125 mg total) by mouth 2 (two) times daily with a meal., Disp: 60 tablet, Rfl: 3 .  FLUoxetine (PROZAC) 20 MG tablet, Take 20 mg by mouth daily., Disp: , Rfl:  .  pantoprazole (PROTONIX) 40 MG tablet, Take 1 tablet (40 mg total) by mouth daily., Disp: 30 tablet, Rfl: 0 .  potassium chloride SA (KLOR-CON M20) 20 MEQ tablet, Take 2 tablets (40 mEq total) by mouth 2 (two) times daily., Disp: 90 tablet, Rfl: 3 .  rivaroxaban (XARELTO) 20 MG TABS tablet, Take 1 tablet (20 mg total) by mouth daily with supper., Disp: 30 tablet, Rfl: 0 .  sacubitril-valsartan (ENTRESTO) 49-51 MG, Take 1 tablet by mouth 2 (two) times daily., Disp: 60 tablet, Rfl: 6 .  spironolactone (ALDACTONE) 25 MG tablet, Take 1 tablet (25 mg total) by mouth daily., Disp: 30 tablet, Rfl: 0 .  torsemide (DEMADEX) 20 MG tablet, Take 60 mg by mouth 2 (two) times daily., Disp: , Rfl:  No Known Allergies   Social History   Social History  . Marital status: Divorced    Spouse name: N/A  . Number of children: 2  . Years of education: N/A   Occupational History  . scrap metal   . disabled, odd jobs    Social History Main Topics  . Smoking status: Former Smoker    Packs/day: 0.50    Years: 4.00    Types: Cigarettes  . Smokeless  tobacco: Never Used     Comment: about a pack a week. social  . Alcohol use 25.2 oz/week    42 Cans of beer per week     Comment: 6 beers per day  . Drug use: Yes    Frequency: 7.0 times per week    Types: Codeine, Marijuana     Comment: has discontinued  . Sexual activity: Yes    Partners: Female   Other Topics Concern  . Not on file   Social History Narrative   ** Merged History Encounter **        Physical Exam  Constitutional: He is oriented to person, place, and time.  Cardiovascular: Normal rate.   Pulmonary/Chest: Breath sounds normal.  Abdominal: Soft. He exhibits no distension.  Musculoskeletal: Normal range of motion. He  exhibits no edema.  Neurological: He is alert and oriented to person, place, and time.  Skin: Skin is dry.        SAFE - 02/26/17 1500      Situation   Admitting diagnosis CHF   Heart failure history Exisiting   Comorbidities Atrial fibillation;HTN;Sleep Apnea   Readmitted within 30 days Yes   Hospital admission within past 12 months Yes   number of hospital admissions 2   number of ED visits 2     Assessment   Lives alone No   Primary support person [name] [name]   Mode of transportation personal car   Other services involved None   Home equipement Scale     Weight   Weighs self daily Yes   Scale provided No   Records on weight chart Yes     Resources   Has "Living better w/heart failure" book Yes   Has HF Zone tool Yes   Able to identify yellow zone signs/when to call MD Yes   Records zone daily Yes     Medications   Uses a pill box No   Difficulty obtaining medications No   Mail order medications Yes   New medications at home today No   Which ones xarelto, entresto   Next scheduled delivery of medications 03/18/17     Nutrition   Patient receives meals on wheels No   Patient follows low sodium diet Yes   Has foods at home that meet the current recommended diet Yes   Patient follows low sugar/card diet Yes   Nutritional concerns/issues No     Activity Level   ADL's/Mobility Independent   How many feet can patient ambulate >50'   Typical activity level Active   Barriers None     Urine   Difficulty urinating No   Changes in urine None     Time spent with patient   Time spent with patient  60 Minutes        Future Appointments Date Time Provider Department Center  05/07/2017 10:30 AM [facility] PA/NP [facility] None  07/02/2017 9:40 AM [facility] DEVICE [facility]OT[facility]   BP 114/64 (BP Location: Right Arm, Patient Position: Sitting, Cuff Size: Large)   Pulse 76   Resp 16   Wt 299 lb (135.6 kg)   SpO2 98%   BMI 36.40 kg/m  Weight  yesterday- 297 Last visit weight-   [name] was seen at home today as a follow up for hypotension noted on Monday. He denied SOB, headache, dizziness or orthopnea. His vital signs today were within normal limits. He was also given a scale today because he stated his stopped functioning properly.  His medications were verified and his pillbox was filled. He needs potassium by Wednesday and protonix by Thursday. Both have been ordered from the [facility].   Jacqualine Code, EMT 04/13/17  ACTION: Home visit completed Next visit planned for next week

## 2017-04-17 ENCOUNTER — Other Ambulatory Visit (HOSPITAL_COMMUNITY): Payer: Self-pay | Admitting: Cardiology

## 2017-04-20 ENCOUNTER — Other Ambulatory Visit: Payer: Self-pay | Admitting: Oncology

## 2017-04-20 ENCOUNTER — Other Ambulatory Visit (HOSPITAL_COMMUNITY): Payer: Self-pay | Admitting: Pharmacist

## 2017-04-20 ENCOUNTER — Other Ambulatory Visit (HOSPITAL_COMMUNITY): Payer: Self-pay

## 2017-04-20 ENCOUNTER — Telehealth (HOSPITAL_COMMUNITY): Payer: Self-pay

## 2017-04-20 MED ORDER — ATORVASTATIN CALCIUM 80 MG PO TABS: 80.0000 mg | ORAL_TABLET | Freq: Every day | ORAL | 11 refills | Status: DC

## 2017-04-20 NOTE — Progress Notes (Signed)
Paramedicine Encounter    Patient ID: [name] [name], male    DOB: 1961-04-26, 56 y.o.   MRN: [id number]    Patient Care Team: [name], MD as PCP - General (Cardiology) [name], MD (Family Medicine)  Patient Active Problem List   Diagnosis Date Noted  . AKI (acute kidney injury) (HCC)   . Acute on chronic systolic CHF (congestive heart failure) (HCC) 01/29/2017  . HTN (hypertension) 12/25/2016  . Obesity 12/25/2016  . OSA (obstructive sleep apnea) 12/25/2016  . Hyperglycemia 12/25/2016  . Acute respiratory failure with hypoxia (HCC) 12/25/2016  . Chronic anticoagulation 06/27/2016  . Encounter for therapeutic drug monitoring 04/19/2015  . S/P ICD (internal cardiac defibrillator) procedure Medtronic CRT ICD 04/15/2015  . Atrial fibrillation with RVR - Paroxysmal 04/08/2015  . NICM- EF 25% 2012 & 20-25% 2016 04/06/2015  . Troponin level elevated 04/06/2015  . Rectal bleeding 10/26/2011  . Hydrocele, left 05/24/2011  . Tobacco use disorder- 1/2 pk a day   . H/O ETOH abuse 6-8 beers a day   . Obesity 09/27/2008  . CAD- minor CAD 2009 and 2016 09/27/2008  . VENTRICULAR TACHYCARDIA 09/27/2008  . Essential hypertension 06/19/2008  . Chronic systolic heart failure (HCC) 06/19/2008    Current Outpatient Prescriptions:  .  amiodarone (PACERONE) 200 MG tablet, Take 1 tablet (200 mg total) by mouth daily., Disp: 30 tablet, Rfl: 0 .  aspirin 81 MG chewable tablet, Chew 81 mg by mouth daily., Disp: , Rfl:  .  atorvastatin (LIPITOR) 80 MG tablet, Take 1 tablet (80 mg total) by mouth daily at 6 PM., Disp: 30 tablet, Rfl: 0 .  carvedilol (COREG) 3.125 MG tablet, Take 1 tablet (3.125 mg total) by mouth 2 (two) times daily with a meal., Disp: 60 tablet, Rfl: 3 .  FLUoxetine (PROZAC) 20 MG tablet, Take 20 mg by mouth daily., Disp: , Rfl:  .  potassium chloride SA (KLOR-CON M20) 20 MEQ tablet, Take 2 tablets (40 mEq total) by mouth 2 (two) times daily., Disp: 90 tablet, Rfl:  3 .  rivaroxaban (XARELTO) 20 MG TABS tablet, Take 1 tablet (20 mg total) by mouth daily with supper., Disp: 30 tablet, Rfl: 0 .  sacubitril-valsartan (ENTRESTO) 49-51 MG, Take 1 tablet by mouth 2 (two) times daily., Disp: 60 tablet, Rfl: 6 .  spironolactone (ALDACTONE) 25 MG tablet, Take 1 tablet (25 mg total) by mouth daily., Disp: 30 tablet, Rfl: 0 .  torsemide (DEMADEX) 20 MG tablet, Take 60 mg by mouth 2 (two) times daily., Disp: , Rfl:  .  acetaminophen (TYLENOL) 325 MG tablet, Take 2 tablets (650 mg total) by mouth every 4 (four) hours as needed for headache or mild pain., Disp: , Rfl:  .  albuterol (PROVENTIL HFA;VENTOLIN HFA) 108 (90 Base) MCG/ACT inhaler, Inhale 1 puff into the lungs every 6 (six) hours as needed for wheezing or shortness of breath., Disp: 1 Inhaler, Rfl: 1 .  pantoprazole (PROTONIX) 40 MG tablet, Take 1 tablet (40 mg total) by mouth daily., Disp: 30 tablet, Rfl: 0 No Known Allergies   Social History   Social History  . Marital status: Divorced    Spouse name: N/A  . Number of children: 2  . Years of education: N/A   Occupational History  . scrap metal   . disabled, odd jobs    Social History Main Topics  . Smoking status: Former Smoker    Packs/day: 0.50    Years: 4.00    Types: Cigarettes  .  Smokeless tobacco: Never Used     Comment: about a pack a week. social  . Alcohol use 25.2 oz/week    42 Cans of beer per week     Comment: 6 beers per day  . Drug use: Yes    Frequency: 7.0 times per week    Types: Codeine, Marijuana     Comment: has discontinued  . Sexual activity: Yes    Partners: Female   Other Topics Concern  . Not on file   Social History Narrative   ** Merged History Encounter **        Physical Exam  Pulmonary/Chest: No respiratory distress. He has no rales.  Abdominal: He exhibits no distension. There is no tenderness. There is no guarding.  Musculoskeletal: He exhibits no edema.  Skin: Skin is warm and dry. He is not  diaphoretic.        SAFE - 02/26/17 1500      Situation   Admitting diagnosis CHF   Heart failure history Exisiting   Comorbidities Atrial fibillation;HTN;Sleep Apnea   Readmitted within 30 days Yes   Hospital admission within past 12 months Yes   number of hospital admissions 2   number of ED visits 2     Assessment   Lives alone No   Primary support person Dede Susette Racer   Mode of transportation personal car   Other services involved None   Home equipement Scale     Weight   Weighs self daily Yes   Scale provided No   Records on weight chart Yes     Resources   Has "Living better w/heart failure" book Yes   Has HF Zone tool Yes   Able to identify yellow zone signs/when to call MD Yes   Records zone daily Yes     Medications   Uses a pill box No   Difficulty obtaining medications No   Mail order medications Yes   New medications at home today No   Which ones xarelto, entresto   Next scheduled delivery of medications 03/18/17     Nutrition   Patient receives meals on wheels No   Patient follows low sodium diet Yes   Has foods at home that meet the current recommended diet Yes   Patient follows low sugar/card diet Yes   Nutritional concerns/issues No     Activity Level   ADL's/Mobility Independent   How many feet can patient ambulate >50'   Typical activity level Active   Barriers None     Urine   Difficulty urinating No   Changes in urine None     Time spent with patient   Time spent with patient  60 Minutes        Future Appointments Date Time Provider Department Center  05/07/2017 10:30 AM [facility] PA/NP [facility] None  07/02/2017 9:40 AM [facility] DEVICE REMOTES [facility]    ATF pt CAO x4 c/o feeling tired because he woke early to take his [place] to the airport.  Pt has taken all of his medications this week that were placed in his box.  Pt stated he had dizziness a little this week but its usually after he bends over. Pt denies  sob, dizziness, chest pain and headache today.  He's hypotensive, therefore I called [facility] and Megan advised pt to stop taking the carvediol until his next appointment on 8/20 due to his bp issues, his pill box revised accordingly.  rx bottles verified and pill box filled.   **  rx called in: Torsemide xarelto Atorvastatin  BP (!) 80/60 (BP Location: Left Arm, Patient Position: Sitting)   Pulse 70   Wt 296 lb (134.3 kg)   SpO2 98%   BMI 36.03 kg/m   Weight yesterday-296 Last visit weight-299    Bentlee Drier, EMT Paramedic 04/20/2017    ACTION: Home visit completed

## 2017-04-20 NOTE — Telephone Encounter (Signed)
Dee with CHF paramedicine program called to report patient's sbp 80s. Patient recently held coreg for same issue with temporary resolution, however, SBP has dropped again since starting back. Per Amy Clegg NP-C, advised to stop coreg until seen in clinic. Reminded of f/u apt on 8/20.  Ave Filter, RN

## 2017-04-24 ENCOUNTER — Other Ambulatory Visit (HOSPITAL_COMMUNITY): Payer: Self-pay | Admitting: Pharmacist

## 2017-04-24 ENCOUNTER — Other Ambulatory Visit (HOSPITAL_COMMUNITY): Payer: Self-pay

## 2017-04-24 MED ORDER — TORSEMIDE 20 MG PO TABS: 60.0000 mg | ORAL_TABLET | Freq: Two times a day (BID) | ORAL | 5 refills | Status: DC

## 2017-04-24 NOTE — Progress Notes (Signed)
Paramedicine Encounter    Patient ID: [name] [name], male    DOB: [birth year], 57 y.o.   MRN: [id number]   Patient Care Team: [name], MD as PCP - General (Cardiology) [name], MD (Family Medicine)  Patient Active Problem List   Diagnosis Date Noted  . AKI (acute kidney injury) (HCC)   . Acute on chronic systolic CHF (congestive heart failure) (HCC) 01/29/2017  . HTN (hypertension) 12/25/2016  . Obesity 12/25/2016  . OSA (obstructive sleep apnea) 12/25/2016  . Hyperglycemia 12/25/2016  . Acute respiratory failure with hypoxia (HCC) 12/25/2016  . Chronic anticoagulation 06/27/2016  . Encounter for therapeutic drug monitoring 04/19/2015  . S/P ICD (internal cardiac defibrillator) procedure Medtronic CRT ICD 04/15/2015  . Atrial fibrillation with RVR - Paroxysmal 04/08/2015  . NICM- EF 25% 2012 & 20-25% 2016 04/06/2015  . Troponin level elevated 04/06/2015  . Rectal bleeding 10/26/2011  . Hydrocele, left 05/24/2011  . Tobacco use disorder- 1/2 pk a day   . H/O ETOH abuse 6-8 beers a day   . Obesity 09/27/2008  . CAD- minor CAD 2009 and 2016 09/27/2008  . VENTRICULAR TACHYCARDIA 09/27/2008  . Essential hypertension 06/19/2008  . Chronic systolic heart failure (HCC) 06/19/2008    Current Outpatient Prescriptions:  .  acetaminophen (TYLENOL) 325 MG tablet, Take 2 tablets (650 mg total) by mouth every 4 (four) hours as needed for headache or mild pain., Disp: , Rfl:  .  albuterol (PROVENTIL HFA;VENTOLIN HFA) 108 (90 Base) MCG/ACT inhaler, Inhale 1 puff into the lungs every 6 (six) hours as needed for wheezing or shortness of breath., Disp: 1 Inhaler, Rfl: 1 .  amiodarone (PACERONE) 200 MG tablet, Take 1 tablet (200 mg total) by mouth daily., Disp: 30 tablet, Rfl: 0 .  aspirin 81 MG chewable tablet, Chew 81 mg by mouth daily., Disp: , Rfl:  .  atorvastatin (LIPITOR) 80 MG tablet, Take 1 tablet (80 mg total) by mouth daily at 6 PM., Disp: 30 tablet, Rfl: 11 .   FLUoxetine (PROZAC) 20 MG tablet, Take 20 mg by mouth daily., Disp: , Rfl:  .  potassium chloride SA (KLOR-CON M20) 20 MEQ tablet, Take 2 tablets (40 mEq total) by mouth 2 (two) times daily., Disp: 90 tablet, Rfl: 3 .  rivaroxaban (XARELTO) 20 MG TABS tablet, Take 1 tablet (20 mg total) by mouth daily with supper., Disp: 30 tablet, Rfl: 0 .  sacubitril-valsartan (ENTRESTO) 49-51 MG, Take 1 tablet by mouth 2 (two) times daily., Disp: 60 tablet, Rfl: 6 .  spironolactone (ALDACTONE) 25 MG tablet, Take 1 tablet (25 mg total) by mouth daily., Disp: 30 tablet, Rfl: 0 .  torsemide (DEMADEX) 20 MG tablet, Take 60 mg by mouth 2 (two) times daily., Disp: , Rfl:  .  pantoprazole (PROTONIX) 40 MG tablet, Take 1 tablet (40 mg total) by mouth daily. (Patient not taking: Reported on 04/24/2017), Disp: 30 tablet, Rfl: 0 No Known Allergies   Social History   Social History  . Marital status: Divorced    Spouse name: N/A  . Number of children: 2  . Years of education: N/A   Occupational History  . scrap metal   . disabled, odd jobs    Social History Main Topics  . Smoking status: Former Smoker    Packs/day: 0.50    Years: 4.00    Types: Cigarettes  . Smokeless tobacco: Never Used     Comment: about a pack a week. social  . Alcohol use 25.2 oz/week  42 Cans of beer per week     Comment: 6 beers per day  . Drug use: Yes    Frequency: 7.0 times per week    Types: Codeine, Marijuana     Comment: has discontinued  . Sexual activity: Yes    Partners: Female   Other Topics Concern  . Not on file   Social History Narrative   ** Merged History Encounter **        Physical Exam  Constitutional: He is oriented to person, place, and time.  Cardiovascular: Normal rate and regular rhythm.   Pulmonary/Chest: Effort normal and breath sounds normal. No respiratory distress. He has no wheezes. He has no rales.  Abdominal: Soft. He exhibits no distension.  Musculoskeletal: Normal range of motion. He  exhibits no edema.  Neurological: He is alert and oriented to person, place, and time.  Skin: Skin is warm and dry.  Psychiatric: He has a normal mood and affect.        SAFE - 02/26/17 1500      Situation   Admitting diagnosis CHF   Heart failure history Exisiting   Comorbidities Atrial fibillation;HTN;Sleep Apnea   Readmitted within 30 days Yes   Hospital admission within past 12 months Yes   number of hospital admissions 2   number of ED visits 2     Assessment   Lives alone No   Primary support person [name] [name]   Mode of transportation personal car   Other services involved None   Home equipement Scale     Weight   Weighs self daily Yes   Scale provided No   Records on weight chart Yes     Resources   Has "Living better w/heart failure" book Yes   Has HF Zone tool Yes   Able to identify yellow zone signs/when to call MD Yes   Records zone daily Yes     Medications   Uses a pill box No   Difficulty obtaining medications No   Mail order medications Yes   New medications at home today No   Which ones xarelto, entresto   Next scheduled delivery of medications 03/18/17     Nutrition   Patient receives meals on wheels No   Patient follows low sodium diet Yes   Has foods at home that meet the current recommended diet Yes   Patient follows low sugar/card diet Yes   Nutritional concerns/issues No     Activity Level   ADL's/Mobility Independent   How many feet can patient ambulate >50'   Typical activity level Active   Barriers None     Urine   Difficulty urinating No   Changes in urine None     Time spent with patient   Time spent with patient  60 Minutes        Future Appointments Date Time Provider Department Center  05/07/2017 10:30 AM [facility] PA/NP [facility] None  07/02/2017 9:40 AM [facility] DEVICE REMOTES [facility]   BP (!) 94/56 (BP Location: Left Arm, Patient Position: Sitting, Cuff Size: Large)   Pulse 90   Resp 16   Wt  291 lb (132 kg)   SpO2 98%   BMI 35.42 kg/m  Weight yesterday- 295 lbs Last visit weight- 296 lbs  [name] [name] was seen at home today and reports feeling generally well. He denied SOB, H/A, dizziness or orthopnea. He is currently not taking carvedilol as directed by the clinic last week due to hypotension. He recently  picked up torsemide from the pharmacy but they only dispenced 30 pills at 20 mg each and the instructions on the bottle read "Take 3 tablets in the morning and 2 tablets in the evening." It is unclear where these orders came from. I have contacted Erika at the HF clinic and asked her to send a revised Rx to [name] [name]'s pharmacy. He is also out of Protonix but stated he is not having any issues with indigestion. I advised that if he began having issues to contact his PCP for a prescription. Medications were verified and his pillbox was refilled.    Time spent with patient: 33 minutes  Jacqualine Code, EMT 04/24/17  ACTION: Home visit completed Next visit planned for 1 week

## 2017-04-30 ENCOUNTER — Other Ambulatory Visit (HOSPITAL_COMMUNITY): Payer: Self-pay

## 2017-04-30 NOTE — Progress Notes (Signed)
Paramedicine Encounter    Patient ID: [name] [name], male    DOB: 07-13-1961, 56 y.o.   MRN: [id number]   Patient Care Team: [name], MD as PCP - General (Cardiology) [name], MD (Family Medicine)  Patient Active Problem List   Diagnosis Date Noted  . AKI (acute kidney injury) (HCC)   . Acute on chronic systolic CHF (congestive heart failure) (HCC) 01/29/2017  . HTN (hypertension) 12/25/2016  . Obesity 12/25/2016  . OSA (obstructive sleep apnea) 12/25/2016  . Hyperglycemia 12/25/2016  . Acute respiratory failure with hypoxia (HCC) 12/25/2016  . Chronic anticoagulation 06/27/2016  . Encounter for therapeutic drug monitoring 04/19/2015  . S/P ICD (internal cardiac defibrillator) procedure Medtronic CRT ICD 04/15/2015  . Atrial fibrillation with RVR - Paroxysmal 04/08/2015  . NICM- EF 25% 2012 & 20-25% 2016 04/06/2015  . Troponin level elevated 04/06/2015  . Rectal bleeding 10/26/2011  . Hydrocele, left 05/24/2011  . Tobacco use disorder- 1/2 pk a day   . H/O ETOH abuse 6-8 beers a day   . Obesity 09/27/2008  . CAD- minor CAD 2009 and 2016 09/27/2008  . VENTRICULAR TACHYCARDIA 09/27/2008  . Essential hypertension 06/19/2008  . Chronic systolic heart failure (HCC) 06/19/2008    Current Outpatient Prescriptions:  .  acetaminophen (TYLENOL) 325 MG tablet, Take 2 tablets (650 mg total) by mouth every 4 (four) hours as needed for headache or mild pain., Disp: , Rfl:  .  albuterol (PROVENTIL HFA;VENTOLIN HFA) 108 (90 Base) MCG/ACT inhaler, Inhale 1 puff into the lungs every 6 (six) hours as needed for wheezing or shortness of breath., Disp: 1 Inhaler, Rfl: 1 .  amiodarone (PACERONE) 200 MG tablet, Take 1 tablet (200 mg total) by mouth daily., Disp: 30 tablet, Rfl: 0 .  aspirin 81 MG chewable tablet, Chew 81 mg by mouth daily., Disp: , Rfl:  .  atorvastatin (LIPITOR) 80 MG tablet, Take 1 tablet (80 mg total) by mouth daily at 6 PM., Disp: 30 tablet, Rfl: 11 .   FLUoxetine (PROZAC) 20 MG tablet, Take 20 mg by mouth daily., Disp: , Rfl:  .  potassium chloride SA (KLOR-CON M20) 20 MEQ tablet, Take 2 tablets (40 mEq total) by mouth 2 (two) times daily., Disp: 90 tablet, Rfl: 3 .  rivaroxaban (XARELTO) 20 MG TABS tablet, Take 1 tablet (20 mg total) by mouth daily with supper., Disp: 30 tablet, Rfl: 0 .  sacubitril-valsartan (ENTRESTO) 49-51 MG, Take 1 tablet by mouth 2 (two) times daily., Disp: 60 tablet, Rfl: 6 .  spironolactone (ALDACTONE) 25 MG tablet, Take 1 tablet (25 mg total) by mouth daily., Disp: 30 tablet, Rfl: 0 .  torsemide (DEMADEX) 20 MG tablet, Take 3 tablets (60 mg total) by mouth 2 (two) times daily., Disp: 60 tablet, Rfl: 5 .  pantoprazole (PROTONIX) 40 MG tablet, Take 1 tablet (40 mg total) by mouth daily. (Patient not taking: Reported on 04/24/2017), Disp: 30 tablet, Rfl: 0 No Known Allergies   Social History   Social History  . Marital status: Divorced    Spouse name: N/A  . Number of children: 2  . Years of education: N/A   Occupational History  . scrap metal   . disabled, odd jobs    Social History Main Topics  . Smoking status: Former Smoker    Packs/day: 0.50    Years: 4.00    Types: Cigarettes  . Smokeless tobacco: Never Used     Comment: about a pack a week. social  . Alcohol  use 25.2 oz/week    42 Cans of beer per week     Comment: 6 beers per day  . Drug use: Yes    Frequency: 7.0 times per week    Types: Codeine, Marijuana     Comment: has discontinued  . Sexual activity: Yes    Partners: Female   Other Topics Concern  . Not on file   Social History Narrative   ** Merged History Encounter **        Physical Exam  Constitutional: He is oriented to person, place, and time.  Cardiovascular: Normal rate and regular rhythm.   Pulmonary/Chest: Effort normal and breath sounds normal. No respiratory distress. He has no wheezes. He has no rales.  Abdominal: Soft.  Musculoskeletal: Normal range of motion. He  exhibits no edema.  Neurological: He is alert and oriented to person, place, and time.  Skin: Skin is warm and dry.        Future Appointments Date Time Provider Department Center  05/07/2017 10:30 AM [facility] PA/NP [facility] None  07/02/2017 9:40 AM [facility] DEVICE REMOTES [facility]   BP 90/60 (BP Location: Left Arm, Patient Position: Sitting, Cuff Size: Large)   Pulse 86   Resp 18   Wt 291 lb (132 kg)   SpO2 98%   BMI 35.42 kg/m  Weight yesterday- 295 lbs Last visit weight- 291 lbs  [name] was seen at home today and reports feeling well. He denied SOB, H/A, dizziness or orthopnea. He has been taking medications as directed. Torsemide and Entresto were ordered from [facility] and will be ready for pick up tomorrow. He has an appointment next Monday and has been asked to bring his medications and pillbox with him so I can fill his pillbox then. Medications were verified and pillbox was filled today. He remains hypotensive despite being taken off carvedilol. No other concerns at this time.  Time spent with patient: 27 minutes  Jacqualine Code, EMT 04/30/17  ACTION: Home visit completed Next visit planned for 1 week

## 2017-05-07 ENCOUNTER — Other Ambulatory Visit (HOSPITAL_COMMUNITY): Payer: Self-pay

## 2017-05-07 ENCOUNTER — Ambulatory Visit (HOSPITAL_COMMUNITY)
Admission: RE | Admit: 2017-05-07 | Discharge: 2017-05-07 | Disposition: A | Discharge status: Home / Self Care | Payer: Medicaid Other | Source: Ambulatory Visit | Attending: Cardiology | Admitting: Cardiology

## 2017-05-07 ENCOUNTER — Encounter (HOSPITAL_COMMUNITY): Payer: Self-pay

## 2017-05-07 VITALS — BP 114/78 | HR 74 | Wt 300.8 lb

## 2017-05-07 DIAGNOSIS — I480 Paroxysmal atrial fibrillation: Secondary | ICD-10-CM | POA: Insufficient documentation

## 2017-05-07 DIAGNOSIS — I110 Hypertensive heart disease with heart failure: Secondary | ICD-10-CM | POA: Diagnosis not present

## 2017-05-07 DIAGNOSIS — J449 Chronic obstructive pulmonary disease, unspecified: Secondary | ICD-10-CM | POA: Diagnosis not present

## 2017-05-07 DIAGNOSIS — I4891 Unspecified atrial fibrillation: Secondary | ICD-10-CM

## 2017-05-07 DIAGNOSIS — F1210 Cannabis abuse, uncomplicated: Secondary | ICD-10-CM

## 2017-05-07 DIAGNOSIS — F17210 Nicotine dependence, cigarettes, uncomplicated: Secondary | ICD-10-CM | POA: Insufficient documentation

## 2017-05-07 DIAGNOSIS — I2510 Atherosclerotic heart disease of native coronary artery without angina pectoris: Secondary | ICD-10-CM | POA: Diagnosis not present

## 2017-05-07 DIAGNOSIS — Z95810 Presence of automatic (implantable) cardiac defibrillator: Secondary | ICD-10-CM | POA: Insufficient documentation

## 2017-05-07 DIAGNOSIS — I428 Other cardiomyopathies: Secondary | ICD-10-CM | POA: Diagnosis not present

## 2017-05-07 DIAGNOSIS — I426 Alcoholic cardiomyopathy: Secondary | ICD-10-CM | POA: Diagnosis not present

## 2017-05-07 DIAGNOSIS — I5022 Chronic systolic (congestive) heart failure: Secondary | ICD-10-CM

## 2017-05-07 DIAGNOSIS — G4733 Obstructive sleep apnea (adult) (pediatric): Secondary | ICD-10-CM | POA: Insufficient documentation

## 2017-05-07 DIAGNOSIS — F1010 Alcohol abuse, uncomplicated: Secondary | ICD-10-CM

## 2017-05-07 DIAGNOSIS — Z7901 Long term (current) use of anticoagulants: Secondary | ICD-10-CM | POA: Diagnosis not present

## 2017-05-07 DIAGNOSIS — I10 Essential (primary) hypertension: Secondary | ICD-10-CM | POA: Diagnosis not present

## 2017-05-07 DIAGNOSIS — Z7982 Long term (current) use of aspirin: Secondary | ICD-10-CM | POA: Diagnosis not present

## 2017-05-07 DIAGNOSIS — Z87891 Personal history of nicotine dependence: Secondary | ICD-10-CM | POA: Insufficient documentation

## 2017-05-07 LAB — BASIC METABOLIC PANEL
Anion gap: 6 (ref 5–15)
BUN: 15 mg/dL (ref 6–20)
CALCIUM: 9 mg/dL (ref 8.9–10.3)
CO2: 28 mmol/L (ref 22–32)
CREATININE: 1.03 mg/dL (ref 0.61–1.24)
Chloride: 103 mmol/L (ref 101–111)
Glucose, Bld: 104 mg/dL — ABNORMAL HIGH (ref 65–99)
Potassium: 4.1 mmol/L (ref 3.5–5.1)
SODIUM: 137 mmol/L (ref 135–145)

## 2017-05-07 MED ORDER — TORSEMIDE 20 MG PO TABS: 60.0000 mg | ORAL_TABLET | Freq: Two times a day (BID) | ORAL | 5 refills | Status: DC

## 2017-05-07 NOTE — Progress Notes (Signed)
Advanced Heart Failure Clinic Note   Referring Physician: Dr. Cyndie Chime Primary Care: Dr. Windle Guard, MD Primary Cardiologist: Dr. Shona Simpson. [name] [name]  HPI: [name] [name] is a 56 year old male with a past medical history of chronic systolic CHF (last EF 15% in April 2018), dilated NICM s/p bi V ICD, PAF with intermittent compliance with Xarelto, ETOH abuse, tobacco abuse, OSA on CPAP, and HTN.   Admitted 12/25/16-12/27/16 with acute on chronic systolic CHF, volume overload. He was diuresed 11 pounds on IV lasix.  Discharge weight was 294 pounds. It was felt that the etiology of his NICM was ETOH vs hypertension related.   Admitted 5/14-5/16 with volume overload, acute respiratory failure requiring Bipap. He was not taking his Entresto as he was confused about dosing and there was a mix up his pharmacy. He was diuresed with IV lasix, discharge weight was 295 pounds.   He returns today for regular follow up. Weight down 1 lb from last visit. Entresto increased at that visit. Denies SOB on flat ground. Mild DOE at times on an incline. Trying to watch salt and fluid.  Smoking marijuana daily. Drinking ETOH 4-6 beers a day. Not smoking cigarettes. Occasional lightheadedness with bending over and standing up, but not marked or limiting. Denies peripheral edema.   OPTIVOL: Fluid index down with no recent crossings. Thoracic impedence in dry range. Active for > 5 hours a day.   Past Medical History:  Diagnosis Date  . CAD (coronary artery disease)    Mild nonobstructive (Cath 09)  . Chronic systolic heart failure (HCC)    Nonischemic CM: echo 4/12 with EF 25-30%, grade 2 diast dysfxn, mild dilated aortic root 43 mm, trivial MR, mod LAE  . COPD (chronic obstructive pulmonary disease) (HCC)   . Dyspnea   . H/O ETOH abuse   . HTN (hypertension)   . Hypersomnia   . Obesity, morbid (HCC)   . OSA on CPAP   . Systolic and diastolic CHF, acute on chronic (HCC) 11/2016  . Tobacco use disorder   .  Ventricular tachycardia [facility])     Current Outpatient Prescriptions  Medication Sig Dispense Refill  . acetaminophen (TYLENOL) 325 MG tablet Take 2 tablets (650 mg total) by mouth every 4 (four) hours as needed for headache or mild pain.    Marland Kitchen albuterol (PROVENTIL HFA;VENTOLIN HFA) 108 (90 Base) MCG/ACT inhaler Inhale 1 puff into the lungs every 6 (six) hours as needed for wheezing or shortness of breath. 1 Inhaler 1  . amiodarone (PACERONE) 200 MG tablet Take 1 tablet (200 mg total) by mouth daily. 30 tablet 0  . aspirin 81 MG chewable tablet Chew 81 mg by mouth daily.    Marland Kitchen atorvastatin (LIPITOR) 80 MG tablet Take 1 tablet (80 mg total) by mouth daily at 6 PM. 30 tablet 11  . FLUoxetine (PROZAC) 20 MG tablet Take 20 mg by mouth daily.    . pantoprazole (PROTONIX) 40 MG tablet Take 1 tablet (40 mg total) by mouth daily. (Patient not taking: Reported on 04/24/2017) 30 tablet 0  . potassium chloride SA (KLOR-CON M20) 20 MEQ tablet Take 2 tablets (40 mEq total) by mouth 2 (two) times daily. 90 tablet 3  . rivaroxaban (XARELTO) 20 MG TABS tablet Take 1 tablet (20 mg total) by mouth daily with supper. 30 tablet 0  . sacubitril-valsartan (ENTRESTO) 49-51 MG Take 1 tablet by mouth 2 (two) times daily. 60 tablet 6  . spironolactone (ALDACTONE) 25 MG tablet Take 1 tablet (25  mg total) by mouth daily. 30 tablet 0  . torsemide (DEMADEX) 20 MG tablet Take 3 tablets (60 mg total) by mouth 2 (two) times daily. 60 tablet 5   No current facility-administered medications for this encounter.     No Known Allergies    Social History   Social History  . Marital status: Divorced    Spouse name: N/A  . Number of children: 2  . Years of education: N/A   Occupational History  . scrap metal   . disabled, odd jobs    Social History Main Topics  . Smoking status: Former Smoker    Packs/day: 0.50    Years: 4.00    Types: Cigarettes  . Smokeless tobacco: Never Used     Comment: about a pack a week. social    . Alcohol use 25.2 oz/week    42 Cans of beer per week     Comment: 6 beers per day  . Drug use: Yes    Frequency: 7.0 times per week    Types: Codeine, Marijuana     Comment: has discontinued  . Sexual activity: Yes    Partners: Female   Other Topics Concern  . Not on file   Social History Narrative   ** Merged History Encounter **          Family History  Problem Relation Age of Onset  . Other Mother        cardiac surgery. late 1990s  . Heart Problems Mother        CABG AGE 57  . Congestive Heart Failure Father   . Healthy Father        AGE 64  . Coronary artery disease Unknown   . Healthy Brother        AGE 76  . Healthy Sister        AGE 39  . Healthy Sister        AGE 90  . Healthy Son   . Healthy Son   . Healthy Daughter     Vitals:   05/07/17 1032  BP: 114/78  Pulse: 74  SpO2: 95%  Weight: (!) 300 lb 12.8 oz (136.4 kg)   Wt Readings from Last 3 Encounters:  05/07/17 (!) 300 lb 12.8 oz (136.4 kg)  04/30/17 291 lb (132 kg)  04/24/17 291 lb (132 kg)     PHYSICAL EXAM: General: Well appearing. No resp difficulty. HEENT: Normal Neck: Supple. JVP 5-6. Carotids 2+ bilat; no bruits. No thyromegaly or nodule noted. Cor: PMI nondisplaced. RRR, No M/G/R noted Lungs: CTAB, normal effort. Abdomen: Soft, non-tender, non-distended, no HSM. No bruits or masses. +BS  Extremities: No cyanosis, clubbing, rash, R and LLE no edema.  Neuro: Alert & orientedx3, cranial nerves grossly intact. moves all 4 extremities w/o difficulty. Affect pleasant   EKG: Personally reviewed, A sensed V paced 74 bpm  ASSESSMENT & PLAN: 1. Chronic combined systolic and diastolic CHF: NICM, Echo 12/2016 EF 15%. Normal cors in July 2016. ETOH vs. Hypertension - NYHA II - Volume status stable on exam and optivol.  - Continue torsemide 60mg  BID. Take an extra 20mg  torsemide if weight increases by more than 3 pounds in one day or 5 pounds in one week.  Will refill today.  - Continue  Entresto to 49/51 mg BID. BMET today.   - Has Bi-V ICD, follows with Dr. Graciela Husbands - Continue Cleda Daub 25mg  daily.  - Intolerant to Coreg with hypotension.  - Reinforced fluid restriction to <  2 L daily, sodium restriction to less than 2000 mg daily, and the importance of daily weights.   2. PAF - Continue Amiodarone, he was advised to get a yearly eye exam.  - LFTs/TSH OK recently.  -This patients CHA2DS2-VASc is at least 2.  - Continue Xarelto for anticoagulation.  - Denies melena and hematochezia  3. ETOH abuse - Encourage complete cessation. Now drinking 4-6 beers a day. Stressed importance of cessation and reviewed ETOH cardiomyopathy 4. Tobacco abuse - Encouraged complete cessation.  5. Substance abuse - Continues to smoke marijuana daily.  - He refuses cessation at this time.   2 months with Echo. Increase coreg to 6.25 mg BID. Labs today.   Graciella Freer, PA-C 05/07/17   Greater than 50% of the 25 minute visit was spent in counseling/coordination of care regarding disease state education, sliding scale diuretics, ETOH cessation, salt/fluid restriction, and medication reconciliation.

## 2017-05-07 NOTE — Progress Notes (Addendum)
Paramedicine Encounter   Patient ID: Phabian Risco , male,   DOB: 08/30/1961,56 y.o.,  MRN: [id number]   Mr Wald was seen in the clinic today and reports feeling well. He expressed concern over having to go to the dentist from a tooth ache and wanted to know if he should stop taking the blood thinner before going. Mardelle Matte advised that is the tooth has to be pulled to hold the blood thinner the day prior to and day of the procedure. He missed his evening medications on Saturday because he was mowing a friends yard and forgot. He also missed 20 mEq of potassium and 80 mg of atorvastatin on Sunday evening. He stated those two pills must have gotten stuck in the box when he dumped the others into his hand. His medications were verified and his pill box was refilled. The torsemide Rx that he picked up was only enough for 5 days so I am having Mardelle Matte resend the Rx with the appropriate amount instructions and number of pills. Mr Niebel was tachycardic when clinic staff checked his v/s and an ECG was ordered. No medications were changed today. His major issue at this point is alcohol consumption after drinking 6 beers yeaterday.   Time spent with patient: 40 minutes  ECG showed a paced rhythm on the 70's. Only hold blood thinner if denitist requests holding  Jacqualine Code, EMT 05/07/2017   ACTION: Home visit completed Next visit planned for 1 week

## 2017-05-07 NOTE — Patient Instructions (Signed)
Routine lab work today. Will notify you of abnormal results, otherwise no news is good news!  No changes to medication at this time.  Refill sent for Torsemide.  Follow up 2 months with echocardiogram and appointment with Dr. Shirlee Latch.  Take all medication as prescribed the day of your appointment. Bring all medications with you to your appointment.  Do the following things EVERYDAY: 1) Weigh yourself in the morning before breakfast. Write it down and keep it in a log. 2) Take your medicines as prescribed 3) Eat low salt foods-Limit salt (sodium) to 2000 mg per day.  4) Stay as active as you can everyday 5) Limit all fluids for the day to less than 2 liters

## 2017-05-14 ENCOUNTER — Other Ambulatory Visit (HOSPITAL_COMMUNITY): Payer: Self-pay

## 2017-05-14 NOTE — Progress Notes (Signed)
Paramedicine Encounter    Patient ID: [name] [name], male    DOB: 02/21/1961, 56 y.o.   MRN: [id number]   Patient Care Team: Hochrein, James, MD as PCP - General (Cardiology) Elkins, Wilson Oliver, MD (Family Medicine)  Patient Active Problem List   Diagnosis Date Noted  . AKI (acute kidney injury) (HCC)   . Acute on chronic systolic CHF (congestive heart failure) (HCC) 01/29/2017  . HTN (hypertension) 12/25/2016  . Obesity 12/25/2016  . OSA (obstructive sleep apnea) 12/25/2016  . Hyperglycemia 12/25/2016  . Acute respiratory failure with hypoxia (HCC) 12/25/2016  . Chronic anticoagulation 06/27/2016  . Encounter for therapeutic drug monitoring 04/19/2015  . S/P ICD (internal cardiac defibrillator) procedure Medtronic CRT ICD 04/15/2015  . Atrial fibrillation with RVR - Paroxysmal 04/08/2015  . NICM- EF 25% 2012 & 20-25% 2016 04/06/2015  . Troponin level elevated 04/06/2015  . Rectal bleeding 10/26/2011  . Hydrocele, left 05/24/2011  . Tobacco use disorder- 1/2 pk a day   . H/O ETOH abuse 6-8 beers a day   . Obesity 09/27/2008  . CAD- minor CAD 2009 and 2016 09/27/2008  . VENTRICULAR TACHYCARDIA 09/27/2008  . Essential hypertension 06/19/2008  . Chronic systolic heart failure (HCC) 06/19/2008    Current Outpatient Prescriptions:  .  acetaminophen (TYLENOL) 325 MG tablet, Take 2 tablets (650 mg total) by mouth every 4 (four) hours as needed for headache or mild pain., Disp: , Rfl:  .  albuterol (PROVENTIL HFA;VENTOLIN HFA) 108 (90 Base) MCG/ACT inhaler, Inhale 1 puff into the lungs every 6 (six) hours as needed for wheezing or shortness of breath., Disp: 1 Inhaler, Rfl: 1 .  amiodarone (PACERONE) 200 MG tablet, Take 1 tablet (200 mg total) by mouth daily., Disp: 30 tablet, Rfl: 0 .  aspirin 81 MG chewable tablet, Chew 81 mg by mouth daily., Disp: , Rfl:  .  atorvastatin (LIPITOR) 80 MG tablet, Take 1 tablet (80 mg total) by mouth daily at 6 PM., Disp: 30 tablet, Rfl: 11 .   FLUoxetine (PROZAC) 20 MG tablet, Take 20 mg by mouth daily., Disp: , Rfl:  .  potassium chloride SA (KLOR-CON M20) 20 MEQ tablet, Take 2 tablets (40 mEq total) by mouth 2 (two) times daily., Disp: 90 tablet, Rfl: 3 .  rivaroxaban (XARELTO) 20 MG TABS tablet, Take 1 tablet (20 mg total) by mouth daily with supper., Disp: 30 tablet, Rfl: 0 .  sacubitril-valsartan (ENTRESTO) 49-51 MG, Take 1 tablet by mouth 2 (two) times daily., Disp: 60 tablet, Rfl: 6 .  spironolactone (ALDACTONE) 25 MG tablet, Take 1 tablet (25 mg total) by mouth daily., Disp: 30 tablet, Rfl: 0 .  torsemide (DEMADEX) 20 MG tablet, Take 3 tablets (60 mg total) by mouth 2 (two) times daily., Disp: 180 tablet, Rfl: 5 .  pantoprazole (PROTONIX) 40 MG tablet, Take 1 tablet (40 mg total) by mouth daily. (Patient not taking: Reported on 04/24/2017), Disp: 30 tablet, Rfl: 0 No Known Allergies   Social History   Social History  . Marital status: Divorced    Spouse name: N/A  . Number of children: 2  . Years of education: N/A   Occupational History  . scrap metal   . disabled, odd jobs    Social History Main Topics  . Smoking status: Former Smoker    Packs/day: 0.50    Years: 4.00    Types: Cigarettes  . Smokeless tobacco: Never Used     Comment: about a pack a week. social  . Alcohol   use 25.2 oz/week    42 Cans of beer per week     Comment: 6 beers per day  . Drug use: Yes    Frequency: 7.0 times per week    Types: Codeine, Marijuana     Comment: has discontinued  . Sexual activity: Yes    Partners: Female   Other Topics Concern  . Not on file   Social History Narrative   ** Merged History Encounter **        Physical Exam  Constitutional: He is oriented to person, place, and time.  Pulmonary/Chest: Effort normal and breath sounds normal. No respiratory distress. He has no wheezes. He has no rales.  Abdominal: Soft.  Musculoskeletal: Normal range of motion. He exhibits no edema.  Neurological: He is alert and  oriented to person, place, and time.  Skin: Skin is warm and dry.  Psychiatric: He has a normal mood and affect.        Future Appointments Date Time Provider Department Center  07/02/2017 9:40 AM [facility] DEVICE REMOTES [facility]  07/10/2017 10:00 AM [facility]  07/10/2017 11:00 AM [name], Dalton S, MD [facility] None   BP 94/68 (BP Location: Left Arm, Patient Position: Sitting, Cuff Size: Large)   Pulse 90   Resp 16   Wt 295 lb (133.8 kg)   SpO2 98%   BMI 35.91 kg/m  Weight yesterday- 295 lb Last visit weight- 295 lb  Mr Radney was seen at home today and reports feeling well. He denied SOB, dizziness, headache or orthopnea. He remains compliant with all medications which were verified today. His pillbox was refilled as well. His only concern today was that he received a letter from medicaid saying his coverage was being change and medicaid would not pay for anything until an $8,000 deductible was met. This change is set to be effective October 1. He stated he is planning to call the number on the letter to see what could be done. An attempt was made at fitting the ReDS clip, however his anatomy was prohibitive.  Time spent with patient: 30 minutes   Zackery R Shelton, EMT 05/14/17  ACTION: Home visit completed Next visit planned for 1 week       

## 2017-05-17 ENCOUNTER — Other Ambulatory Visit: Payer: Self-pay | Admitting: Internal Medicine

## 2017-05-22 ENCOUNTER — Other Ambulatory Visit (HOSPITAL_COMMUNITY): Payer: Self-pay

## 2017-05-22 ENCOUNTER — Other Ambulatory Visit (HOSPITAL_COMMUNITY): Payer: Self-pay | Admitting: Pharmacist

## 2017-05-22 MED ORDER — SACUBITRIL-VALSARTAN 49-51 MG PO TABS: 1.0000 | ORAL_TABLET | Freq: Two times a day (BID) | ORAL | 11 refills | Status: DC

## 2017-05-22 NOTE — Progress Notes (Signed)
Paramedicine Encounter    Patient ID: [name] [name], male    DOB: 02/21/1961, 56 y.o.   MRN: [id number]   Patient Care Team: Hochrein, James, MD as PCP - General (Cardiology) Elkins, Wilson Oliver, MD (Family Medicine)  Patient Active Problem List   Diagnosis Date Noted  . AKI (acute kidney injury) (HCC)   . Acute on chronic systolic CHF (congestive heart failure) (HCC) 01/29/2017  . HTN (hypertension) 12/25/2016  . Obesity 12/25/2016  . OSA (obstructive sleep apnea) 12/25/2016  . Hyperglycemia 12/25/2016  . Acute respiratory failure with hypoxia (HCC) 12/25/2016  . Chronic anticoagulation 06/27/2016  . Encounter for therapeutic drug monitoring 04/19/2015  . S/P ICD (internal cardiac defibrillator) procedure Medtronic CRT ICD 04/15/2015  . Atrial fibrillation with RVR - Paroxysmal 04/08/2015  . NICM- EF 25% 2012 & 20-25% 2016 04/06/2015  . Troponin level elevated 04/06/2015  . Rectal bleeding 10/26/2011  . Hydrocele, left 05/24/2011  . Tobacco use disorder- 1/2 pk a day   . H/O ETOH abuse 6-8 beers a day   . Obesity 09/27/2008  . CAD- minor CAD 2009 and 2016 09/27/2008  . VENTRICULAR TACHYCARDIA 09/27/2008  . Essential hypertension 06/19/2008  . Chronic systolic heart failure (HCC) 06/19/2008    Current Outpatient Prescriptions:  .  acetaminophen (TYLENOL) 325 MG tablet, Take 2 tablets (650 mg total) by mouth every 4 (four) hours as needed for headache or mild pain., Disp: , Rfl:  .  albuterol (PROVENTIL HFA;VENTOLIN HFA) 108 (90 Base) MCG/ACT inhaler, Inhale 1 puff into the lungs every 6 (six) hours as needed for wheezing or shortness of breath., Disp: 1 Inhaler, Rfl: 1 .  amiodarone (PACERONE) 200 MG tablet, Take 1 tablet (200 mg total) by mouth daily., Disp: 30 tablet, Rfl: 0 .  aspirin 81 MG chewable tablet, Chew 81 mg by mouth daily., Disp: , Rfl:  .  atorvastatin (LIPITOR) 80 MG tablet, Take 1 tablet (80 mg total) by mouth daily at 6 PM., Disp: 30 tablet, Rfl: 11 .   FLUoxetine (PROZAC) 20 MG tablet, Take 20 mg by mouth daily., Disp: , Rfl:  .  potassium chloride SA (KLOR-CON M20) 20 MEQ tablet, Take 2 tablets (40 mEq total) by mouth 2 (two) times daily., Disp: 90 tablet, Rfl: 3 .  rivaroxaban (XARELTO) 20 MG TABS tablet, Take 1 tablet (20 mg total) by mouth daily with supper., Disp: 30 tablet, Rfl: 0 .  sacubitril-valsartan (ENTRESTO) 49-51 MG, Take 1 tablet by mouth 2 (two) times daily., Disp: 60 tablet, Rfl: 6 .  spironolactone (ALDACTONE) 25 MG tablet, Take 1 tablet (25 mg total) by mouth daily., Disp: 30 tablet, Rfl: 0 .  torsemide (DEMADEX) 20 MG tablet, Take 3 tablets (60 mg total) by mouth 2 (two) times daily., Disp: 180 tablet, Rfl: 5 .  pantoprazole (PROTONIX) 40 MG tablet, Take 1 tablet (40 mg total) by mouth daily. (Patient not taking: Reported on 04/24/2017), Disp: 30 tablet, Rfl: 0 No Known Allergies   Social History   Social History  . Marital status: Divorced    Spouse name: N/A  . Number of children: 2  . Years of education: N/A   Occupational History  . scrap metal   . disabled, odd jobs    Social History Main Topics  . Smoking status: Former Smoker    Packs/day: 0.50    Years: 4.00    Types: Cigarettes  . Smokeless tobacco: Never Used     Comment: about a pack a week. social  . Alcohol   use 25.2 oz/week    42 Cans of beer per week     Comment: 6 beers per day  . Drug use: Yes    Frequency: 7.0 times per week    Types: Codeine, Marijuana     Comment: has discontinued  . Sexual activity: Yes    Partners: Female   Other Topics Concern  . Not on file   Social History Narrative   ** Merged History Encounter **        Physical Exam  Constitutional: He is oriented to person, place, and time.  Cardiovascular: Normal rate and regular rhythm.   Pulmonary/Chest: Effort normal and breath sounds normal. No respiratory distress. He has no wheezes. He has no rales.  Abdominal: Soft.  Musculoskeletal: Normal range of motion. He  exhibits no edema.  Neurological: He is alert and oriented to person, place, and time.  Skin: Skin is warm and dry.  Psychiatric: He has a normal mood and affect.        Future Appointments Date Time Provider Department Center  07/02/2017 9:40 AM [facility] DEVICE REMOTES [facility]  07/10/2017 10:00 AM [facility]  07/10/2017 11:00 AM [name], MD [facility] None   BP 130/70 (BP Location: Left Arm, Patient Position: Sitting, Cuff Size: Normal)   Pulse 86   Resp 18   Wt 295 lb (133.8 kg)   SpO2 98%   BMI 35.91 kg/m  Weight yesterday- 294 lbs Last visit weight- 295 lbs  [name] was seen at home today and reports feeling generally well. He denied SOB, headache, dizziness or orthopnea. He is currently worried about losing medicaid which will impact his ability to get some medications. He is currently getting entresto and xarelto through patient assistance programs but other medications will be affected. All medications were verified and his pillbox was refilled. A recent delivery of Entresto was of the wrong strength. I contacted the delivery pharmacy who advised he needed a new prescription faxed to them and they would send the correct dose.  Fax Number for [facility]- [phone]   Time spent with patient: 44 minutes  Jacqualine Code, EMT 05/22/17  ACTION: Home visit completed Next visit planned for 1 week

## 2017-05-23 ENCOUNTER — Telehealth: Payer: Self-pay | Admitting: *Deleted

## 2017-05-23 NOTE — Telephone Encounter (Signed)
CPAP orders signed by Dr. Kelly and faxed to [facility] 

## 2017-05-30 ENCOUNTER — Emergency Department (HOSPITAL_COMMUNITY): Payer: Medicaid Other

## 2017-05-30 ENCOUNTER — Encounter (HOSPITAL_COMMUNITY): Payer: Self-pay | Admitting: Emergency Medicine

## 2017-05-30 ENCOUNTER — Inpatient Hospital Stay (HOSPITAL_COMMUNITY)
Admission: EM | Admit: 2017-05-30 | Discharge: 2017-06-02 | DRG: 309 | Disposition: A | Discharge status: Home / Self Care | Payer: Medicaid Other | Attending: Internal Medicine | Admitting: Internal Medicine

## 2017-05-30 ENCOUNTER — Telehealth: Payer: Self-pay

## 2017-05-30 DIAGNOSIS — I472 Ventricular tachycardia, unspecified: Secondary | ICD-10-CM

## 2017-05-30 DIAGNOSIS — I5022 Chronic systolic (congestive) heart failure: Secondary | ICD-10-CM | POA: Diagnosis not present

## 2017-05-30 DIAGNOSIS — J449 Chronic obstructive pulmonary disease, unspecified: Secondary | ICD-10-CM | POA: Diagnosis present

## 2017-05-30 DIAGNOSIS — F1290 Cannabis use, unspecified, uncomplicated: Secondary | ICD-10-CM | POA: Diagnosis present

## 2017-05-30 DIAGNOSIS — I2721 Secondary pulmonary arterial hypertension: Secondary | ICD-10-CM | POA: Diagnosis present

## 2017-05-30 DIAGNOSIS — I5042 Chronic combined systolic (congestive) and diastolic (congestive) heart failure: Secondary | ICD-10-CM | POA: Diagnosis present

## 2017-05-30 DIAGNOSIS — Z79899 Other long term (current) drug therapy: Secondary | ICD-10-CM | POA: Diagnosis not present

## 2017-05-30 DIAGNOSIS — Z7982 Long term (current) use of aspirin: Secondary | ICD-10-CM

## 2017-05-30 DIAGNOSIS — I2510 Atherosclerotic heart disease of native coronary artery without angina pectoris: Secondary | ICD-10-CM | POA: Diagnosis present

## 2017-05-30 DIAGNOSIS — I5023 Acute on chronic systolic (congestive) heart failure: Secondary | ICD-10-CM | POA: Diagnosis present

## 2017-05-30 DIAGNOSIS — Z87891 Personal history of nicotine dependence: Secondary | ICD-10-CM

## 2017-05-30 DIAGNOSIS — I480 Paroxysmal atrial fibrillation: Secondary | ICD-10-CM | POA: Diagnosis present

## 2017-05-30 DIAGNOSIS — Z6836 Body mass index (BMI) 36.0-36.9, adult: Secondary | ICD-10-CM | POA: Diagnosis not present

## 2017-05-30 DIAGNOSIS — G4733 Obstructive sleep apnea (adult) (pediatric): Secondary | ICD-10-CM | POA: Diagnosis present

## 2017-05-30 DIAGNOSIS — Z95810 Presence of automatic (implantable) cardiac defibrillator: Secondary | ICD-10-CM | POA: Diagnosis present

## 2017-05-30 DIAGNOSIS — I110 Hypertensive heart disease with heart failure: Secondary | ICD-10-CM | POA: Diagnosis present

## 2017-05-30 DIAGNOSIS — I77810 Thoracic aortic ectasia: Secondary | ICD-10-CM | POA: Diagnosis present

## 2017-05-30 DIAGNOSIS — F1010 Alcohol abuse, uncomplicated: Secondary | ICD-10-CM | POA: Diagnosis present

## 2017-05-30 DIAGNOSIS — I429 Cardiomyopathy, unspecified: Secondary | ICD-10-CM | POA: Diagnosis present

## 2017-05-30 DIAGNOSIS — Z8249 Family history of ischemic heart disease and other diseases of the circulatory system: Secondary | ICD-10-CM | POA: Diagnosis not present

## 2017-05-30 DIAGNOSIS — F17200 Nicotine dependence, unspecified, uncomplicated: Secondary | ICD-10-CM | POA: Diagnosis present

## 2017-05-30 DIAGNOSIS — Z4502 Encounter for adjustment and management of automatic implantable cardiac defibrillator: Secondary | ICD-10-CM

## 2017-05-30 DIAGNOSIS — I4891 Unspecified atrial fibrillation: Secondary | ICD-10-CM | POA: Diagnosis present

## 2017-05-30 DIAGNOSIS — Z7901 Long term (current) use of anticoagulants: Secondary | ICD-10-CM

## 2017-05-30 DIAGNOSIS — I428 Other cardiomyopathies: Secondary | ICD-10-CM

## 2017-05-30 DIAGNOSIS — F1011 Alcohol abuse, in remission: Secondary | ICD-10-CM

## 2017-05-30 LAB — CBC
HEMATOCRIT: 32.4 % — AB (ref 39.0–52.0)
HEMOGLOBIN: 11 g/dL — AB (ref 13.0–17.0)
MCH: 31.4 pg (ref 26.0–34.0)
MCHC: 34 g/dL (ref 30.0–36.0)
MCV: 92.6 fL (ref 78.0–100.0)
Platelets: 170 10*3/uL (ref 150–400)
RBC: 3.5 MIL/uL — ABNORMAL LOW (ref 4.22–5.81)
RDW: 14.5 % (ref 11.5–15.5)
WBC: 4.1 10*3/uL (ref 4.0–10.5)

## 2017-05-30 LAB — I-STAT TROPONIN, ED
Troponin i, poc: 0.17 ng/mL (ref 0.00–0.08)
Troponin i, poc: 0.13 ng/mL (ref 0.00–0.08)

## 2017-05-30 LAB — BASIC METABOLIC PANEL
Anion gap: 6 (ref 5–15)
BUN: 15 mg/dL (ref 6–20)
CHLORIDE: 101 mmol/L (ref 101–111)
CO2: 26 mmol/L (ref 22–32)
CREATININE: 0.92 mg/dL (ref 0.61–1.24)
Calcium: 9.2 mg/dL (ref 8.9–10.3)
GFR calc Af Amer: >60 mL/min (ref >60)
GFR calc non Af Amer: >60 mL/min (ref >60)
GLUCOSE: 109 mg/dL — AB (ref 65–99)
POTASSIUM: 4.1 mmol/L (ref 3.5–5.1)
SODIUM: 133 mmol/L — AB (ref 135–145)

## 2017-05-30 LAB — MAGNESIUM: Magnesium: 2.3 mg/dL (ref 1.7–2.4)

## 2017-05-30 MED ORDER — AMIODARONE HCL IN DEXTROSE 360-4.14 MG/200ML-% IV SOLN
60.0000 mg/h | INTRAVENOUS | Status: AC
  Administered 2017-05-30: 60 mg/h via INTRAVENOUS
  Filled 2017-05-30: qty 200

## 2017-05-30 MED ORDER — ALBUTEROL SULFATE (2.5 MG/3ML) 0.083% IN NEBU: 2.5000 mg | INHALATION_SOLUTION | Freq: Four times a day (QID) | RESPIRATORY_TRACT | Status: DC | PRN

## 2017-05-30 MED ORDER — AMIODARONE HCL IN DEXTROSE 360-4.14 MG/200ML-% IV SOLN
30.0000 mg/h | INTRAVENOUS | Status: DC
  Administered 2017-05-31 (×2): 30 mg/h via INTRAVENOUS
  Filled 2017-05-30 (×3): qty 200

## 2017-05-30 MED ORDER — INFLUENZA VAC SPLIT QUAD 0.5 ML IM SUSY
0.5000 mL | PREFILLED_SYRINGE | INTRAMUSCULAR | Status: AC
  Administered 2017-05-31: 0.5 mL via INTRAMUSCULAR
  Filled 2017-05-30: qty 0.5

## 2017-05-30 MED ORDER — ASPIRIN 81 MG PO CHEW
81.0000 mg | CHEWABLE_TABLET | Freq: Every day | ORAL | Status: DC
  Administered 2017-05-31 – 2017-06-02 (×3): 81 mg via ORAL
  Filled 2017-05-30 (×3): qty 1

## 2017-05-30 MED ORDER — NITROGLYCERIN 0.4 MG SL SUBL: 0.4000 mg | SUBLINGUAL_TABLET | SUBLINGUAL | Status: DC | PRN

## 2017-05-30 MED ORDER — POTASSIUM CHLORIDE CRYS ER 20 MEQ PO TBCR
40.0000 meq | EXTENDED_RELEASE_TABLET | Freq: Two times a day (BID) | ORAL | Status: DC
  Administered 2017-05-31 – 2017-06-02 (×5): 40 meq via ORAL
  Filled 2017-05-30 (×5): qty 2

## 2017-05-30 MED ORDER — RIVAROXABAN 20 MG PO TABS
20.0000 mg | ORAL_TABLET | Freq: Every day | ORAL | Status: DC
  Administered 2017-05-31 – 2017-06-01 (×2): 20 mg via ORAL
  Filled 2017-05-30 (×2): qty 1

## 2017-05-30 MED ORDER — TORSEMIDE 20 MG PO TABS
60.0000 mg | ORAL_TABLET | Freq: Two times a day (BID) | ORAL | Status: DC
  Administered 2017-05-31 – 2017-06-02 (×5): 60 mg via ORAL
  Filled 2017-05-30 (×7): qty 3

## 2017-05-30 MED ORDER — ATORVASTATIN CALCIUM 80 MG PO TABS
80.0000 mg | ORAL_TABLET | Freq: Every day | ORAL | Status: DC
Start: 2017-05-31 — End: 2017-06-02
  Administered 2017-05-31 – 2017-06-01 (×2): 80 mg via ORAL
  Filled 2017-05-30 (×2): qty 1

## 2017-05-30 MED ORDER — SOTALOL HCL 80 MG PO TABS
80.0000 mg | ORAL_TABLET | Freq: Two times a day (BID) | ORAL | Status: DC
  Administered 2017-05-31 – 2017-06-02 (×3): 80 mg via ORAL
  Filled 2017-05-30 (×6): qty 1

## 2017-05-30 MED ORDER — SPIRONOLACTONE 25 MG PO TABS
25.0000 mg | ORAL_TABLET | Freq: Every day | ORAL | Status: DC
  Administered 2017-05-31 – 2017-06-02 (×3): 25 mg via ORAL
  Filled 2017-05-30 (×3): qty 1

## 2017-05-30 MED ORDER — ACETAMINOPHEN 325 MG PO TABS: 650.0000 mg | ORAL_TABLET | ORAL | Status: DC | PRN

## 2017-05-30 MED ORDER — ALBUTEROL SULFATE HFA 108 (90 BASE) MCG/ACT IN AERS: 1.0000 | INHALATION_SPRAY | Freq: Four times a day (QID) | RESPIRATORY_TRACT | Status: DC | PRN

## 2017-05-30 MED ORDER — ONDANSETRON HCL 4 MG/2ML IJ SOLN: 4.0000 mg | Freq: Four times a day (QID) | INTRAMUSCULAR | Status: DC | PRN

## 2017-05-30 MED ORDER — FLUOXETINE HCL 20 MG PO CAPS
20.0000 mg | ORAL_CAPSULE | Freq: Every day | ORAL | Status: DC
  Administered 2017-05-31 – 2017-06-02 (×3): 20 mg via ORAL
  Filled 2017-05-30 (×4): qty 1

## 2017-05-30 MED ORDER — SACUBITRIL-VALSARTAN 49-51 MG PO TABS
1.0000 | ORAL_TABLET | Freq: Two times a day (BID) | ORAL | Status: DC
  Administered 2017-05-31: 1 via ORAL
  Filled 2017-05-30 (×4): qty 1

## 2017-05-30 NOTE — ED Provider Notes (Addendum)
[facility] Provider Note   CSN: [id number] Arrival date & time: 05/30/17  1100     History   Chief Complaint Chief Complaint  Patient presents with  . AICD Problem    HPI [name] [name] is a 56 y.o. male.  Patient reports his AICD firing last night approximately 3 times.  He felt diaphoretic last night but no obvious chest pain. He is feeling better today. He was instructed to come to the emergency department. His device was implanted by Dr. Graciela Husbands in approximately 2016.  Nothing makes symptoms better or worse.      Past Medical History:  Diagnosis Date  . CAD (coronary artery disease)    Mild nonobstructive (Cath 09)  . Chronic systolic heart failure (HCC)    Nonischemic CM: echo 4/12 with EF 25-30%, grade 2 diast dysfxn, mild dilated aortic root 43 mm, trivial MR, mod LAE  . COPD (chronic obstructive pulmonary disease) (HCC)   . Dyspnea   . H/O ETOH abuse   . HTN (hypertension)   . Hypersomnia   . Obesity, morbid (HCC)   . OSA on CPAP   . Systolic and diastolic CHF, acute on chronic (HCC) 11/2016  . Tobacco use disorder   . Ventricular tachycardia [facility])     Patient Active Problem List   Diagnosis Date Noted  . AKI (acute kidney injury) (HCC)   . Acute on chronic systolic CHF (congestive heart failure) (HCC) 01/29/2017  . HTN (hypertension) 12/25/2016  . Obesity 12/25/2016  . OSA (obstructive sleep apnea) 12/25/2016  . Hyperglycemia 12/25/2016  . Acute respiratory failure with hypoxia (HCC) 12/25/2016  . Chronic anticoagulation 06/27/2016  . Encounter for therapeutic drug monitoring 04/19/2015  . S/P ICD (internal cardiac defibrillator) procedure Medtronic CRT ICD 04/15/2015  . Atrial fibrillation with RVR - Paroxysmal 04/08/2015  . NICM- EF 25% 2012 & 20-25% 2016 04/06/2015  . Troponin level elevated 04/06/2015  . Rectal bleeding 10/26/2011  . Hydrocele, left 05/24/2011  . Tobacco use disorder- 1/2 pk a day   . H/O ETOH abuse 6-8 beers a day   .  Obesity 09/27/2008  . CAD- minor CAD 2009 and 2016 09/27/2008  . VENTRICULAR TACHYCARDIA 09/27/2008  . Essential hypertension 06/19/2008  . Chronic systolic heart failure (HCC) 06/19/2008    Past Surgical History:  Procedure Laterality Date  . CARDIAC CATHETERIZATION N/A 04/08/2015   Procedure: Right/Left Heart Cath and Coronary Angiography;  Surgeon: Corky Crafts, MD;  Location[facility] INVASIVE CV LAB;  Service: Cardiovascular;  Laterality: N/A;  . EP IMPLANTABLE DEVICE N/A 04/14/2015   Procedure: BiV ICD Insertion CRT-D;  Surgeon: [name], MD;  Location: [facility] INVASIVE CV LAB;  Service: Cardiovascular;  Laterality: N/A;  . HYDROCELE EXCISION / REPAIR    . TUMOR EXCISION Left   . 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 VASECTOMY         Home Medications    Prior to Admission medications   Medication Sig Start Date End Date Taking? Authorizing Provider  acetaminophen (TYLENOL) 325 MG tablet Take 2 tablets (650 mg total) by mouth every 4 (four) hours as needed for headache or mild pain. 04/15/15   [name], NP  albuterol (PROVENTIL HFA;VENTOLIN HFA) 108 (90 Base) MCG/ACT inhaler Inhale 1 puff into the lungs every 6 (six) hours as needed for wheezing or shortness of breath. 01/24/17   [name], NP  amiodarone (PACERONE) 200 MG tablet Take 1 tablet (200  mg total) by mouth daily. 12/28/16   [name], MD  aspirin 81 MG chewable tablet Chew 81 mg by mouth daily.    [provider]  atorvastatin (LIPITOR) 80 MG tablet Take 1 tablet (80 mg total) by mouth daily at 6 PM. 04/20/17   [name], NP  FLUoxetine (PROZAC) 20 MG tablet Take 20 mg by mouth daily.    [provider]  pantoprazole (PROTONIX) 40 MG tablet Take 1 tablet (40 mg total) by mouth daily. Patient not taking: Reported on 04/24/2017 02/08/17   [name], NP  potassium chloride SA (KLOR-CON M20) 20 MEQ tablet Take 2 tablets (40 mEq total) by mouth 2 (two) times daily.  02/08/17 05/22/17  [name], NP  rivaroxaban (XARELTO) 20 MG TABS tablet Take 1 tablet (20 mg total) by mouth daily with supper. 12/27/16   [name], MD  sacubitril-valsartan (ENTRESTO) 49-51 MG Take 1 tablet by mouth 2 (two) times daily. 05/22/17   [name], NP  spironolactone (ALDACTONE) 25 MG tablet Take 1 tablet (25 mg total) by mouth daily. 01/31/17   Zannie Cove, MD  torsemide (DEMADEX) 20 MG tablet Take 3 tablets (60 mg total) by mouth 2 (two) times daily. 05/07/17   Graciella Freer, PA-C    Family History Family History  Problem Relation Age of Onset  . Other Mother        cardiac surgery. late 1990s  . Heart Problems Mother        CABG AGE 52  . Congestive Heart Failure Father   . Healthy Father        AGE 22  . Coronary artery disease Unknown   . Healthy Brother        AGE 84  . Healthy Sister        AGE 96  . Healthy Sister        AGE 41  . Healthy Son   . Healthy Son   . Healthy Daughter     Social History Social History  Substance Use Topics  . Smoking status: Former Smoker    Packs/day: 0.50    Years: 4.00    Types: Cigarettes  . Smokeless tobacco: Never Used     Comment: about a pack a week. social  . Alcohol use 25.2 oz/week    42 Cans of beer per week     Comment: 6 beers per day     Allergies   Patient has no known allergies.   Review of Systems Review of Systems  All other systems reviewed and are negative.    Physical Exam Updated Vital Signs BP (!) 98/48   Pulse 68   Temp 98.1 F (36.7 C) (Oral)   Resp 14   SpO2 99%   Physical Exam  Constitutional: He is oriented to person, place, and time. He appears well-developed and well-nourished.  HENT:  Head: Normocephalic and atraumatic.  Eyes: Conjunctivae are normal.  Neck: Neck supple.  Cardiovascular: Normal rate and regular rhythm.   Pulmonary/Chest: Effort normal and breath sounds normal.  Abdominal: Soft. Bowel sounds are normal.  Musculoskeletal: Normal  range of motion.  Neurological: He is alert and oriented to person, place, and time.  Skin: Skin is warm and dry.  Psychiatric: He has a normal mood and affect. His behavior is normal.  Nursing note and vitals reviewed.    ED Treatments / Results  Labs (all labs ordered are listed, but only abnormal results are displayed) Labs  Reviewed  BASIC METABOLIC PANEL - Abnormal; Notable for the following:       Result Value   Sodium 133 (*)    Glucose, Bld 109 (*)    All other components within normal limits  CBC - Abnormal; Notable for the following:    RBC 3.50 (*)    Hemoglobin 11.0 (*)    HCT 32.4 (*)    All other components within normal limits  I-STAT TROPONIN, ED - Abnormal; Notable for the following:    Troponin i, poc 0.17 (*)    All other components within normal limits    EKG  EKG Interpretation  Date/Time:  Wednesday May 30 2017 11:06:38 EDT Ventricular Rate:  76 PR Interval:    QRS Duration: 180 QT Interval:  480 QTC Calculation: 540 R Axis:   -85 Text Interpretation:  Ventricular-paced rhythm with Premature atrial complexes with Abberant conduction Abnormal ECG No significant change since last tracing Confirmed by Marily Memos [phone]) on 05/30/2017 11:13:25 AM Also confirmed by Donnetta Hutching ([id number])  on 05/30/2017 12:35:48 PM       Radiology Dg Chest 2 View  Result Date: 05/30/2017 CLINICAL DATA:  Cardiac arrhythmia with chest pain EXAM: CHEST  2 VIEW COMPARISON:  Jan 29, 2017. FINDINGS: Lungs are clear. Heart is upper normal in size with pulmonary vascularity within normal limits. Pacemaker leads are attached to the right atrium, right ventricle, and coronary sinus. No adenopathy. There is degenerative change in the thoracic spine. IMPRESSION: Pacemaker leads as described. Heart upper normal in size. No edema or consolidation. Electronically Signed   By: Bretta Bang III M.D.   On: 05/30/2017 11:37    Procedures Procedures (including critical care  time)  Medications Ordered in ED Medications - No data to display   Initial Impression / Assessment and Plan / ED Course  I have reviewed the triage vital signs and the nursing notes.  Pertinent labs & imaging results that were available during my care of the patient were reviewed by me and considered in my medical decision making (see chart for details).     Patient is hemodynamically stable.EKG shows no acute changes. Troponin 0.17. Will discuss with cardiology.  Final Clinical Impressions(s) / ED Diagnoses   Final diagnoses:  AICD discharge    New Prescriptions New Prescriptions   No medications on file     Donnetta Hutching, MD 05/30/17 [id number]    Donnetta Hutching, MD 06/02/17 1118

## 2017-05-30 NOTE — ED Notes (Signed)
Spoke w/ Admitting who stated to drop pt Amiodarone rate to 30mg /hr due to pt becoming hypotensive. Loura Pardon RN made aware.

## 2017-05-30 NOTE — ED Notes (Signed)
Phlebotomy at the bedside  

## 2017-05-30 NOTE — Telephone Encounter (Signed)
Spoke with pt regarding shocks x 4 from last night, pt stated that prior to shock he felt SOB and was real sweaty, pt reported compliance with Amiodarone, pt stated that he felt ok this morning, Informed pt that per clinic policy pt should go to the ED at [facility] if he received more than 2 shocks in 24 hours, pt voiced understanding, informed pt that he should not drive, pt stated that he had someone to drive him to the ED.

## 2017-05-30 NOTE — H&P (Signed)
Cardiology Consultation:   Patient ID: Banjamin [name]; [id number]; Feb 08, 1961   Admit date: 05/30/2017 Date of Consult: 05/30/2017  Primary Care Provider: [name], MD Primary Cardiologist: Dr. Antoine Poche CHF: Dr. Shirlee Latch Primary Electrophysiologist:  Dr. Graciela Husbands   Patient Profile:   [name] [name] is a 56 y.o. male with a hx of chronic systolic CHF (last EF 15% in April 2018), dilated NICM s/p bi V ICD, PAF on Xarelto, ETOH abuse, tobacco abuse, OSA on CPAP, and HTN  who is being admitted 2/2 VT.  History of Present Illness:   [name] [name] was and has been feeling quite well, he has not had any kind of CP, palpitations, or particular exertional intolerances.  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  He states of late he has been feeling well.  [name] [name] was a usual day, nothing unusual and reports compliance with all of his medicines and specifically he says this regime has kept his fluid levels good and has kept up with them, happy with the current regime.  He went to bed last night as usual, woke about 10PM feeling SOB, became lightheaded seated at the edge of his bed then diaphoretic, leaned over to steady himself on the wall/window across from him and got shocked, then again, leaned back and the next thing he knew was waking up.  (he was unaware of the 3rd/4th shock).  He collected himself, feeling well upon waking went to sleep.  Was called by the office today and recommended to go to the ER.  LABS K+ 4.1 BUN/Creat 15/0.92 Trop I: 0.17, 0.13 WBC 4.1 H/H 11/32 Plts 170  Device information MDT CRT-D, implanted 04/14/15, Dr. Graciela Husbands + known PMVT hx (and AF) AAD amiodarone  Device interrogation: VT failed 3 ATPs, third accelerated VT to VF, 3 failed shocks at max output 4th restored SR  Past Medical History:  Diagnosis Date  . CAD (coronary artery disease)    Mild nonobstructive (Cath 09)  . Chronic systolic heart  failure (HCC)    Nonischemic CM: echo 4/12 with EF 25-30%, grade 2 diast dysfxn, mild dilated aortic root 43 mm, trivial MR, mod LAE  . COPD (chronic obstructive pulmonary disease) (HCC)   . Dyspnea   . H/O ETOH abuse   . HTN (hypertension)   . Hypersomnia   . Obesity, morbid (HCC)   . OSA on CPAP   . Systolic and diastolic CHF, acute on chronic (HCC) 11/2016  . Tobacco use disorder   . Ventricular tachycardia [facility])     Past Surgical History:  Procedure Laterality Date  . CARDIAC CATHETERIZATION N/A 04/08/2015   Procedure: Right/Left Heart Cath and Coronary Angiography;  Surgeon: Corky Crafts, MD;  Location: [facility] INVASIVE CV LAB;  Service: Cardiovascular;  Laterality: N/A;  . EP IMPLANTABLE DEVICE N/A 04/14/2015   Procedure: BiV ICD Insertion CRT-D;  Surgeon: [name], MD;  Location: [facility] INVASIVE CV LAB;  Service: Cardiovascular;  Laterality: N/A;  . HYDROCELE EXCISION / REPAIR    . TUMOR EXCISION Left   . 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 VASECTOMY         Inpatient Medications: Scheduled Meds:  Continuous Infusions:  PRN Meds:   Allergies:   No Known Allergies  Social History:   Social History   Social History  . Marital status: Divorced    Spouse name: N/A  . Number of  children: 2  . Years of education: N/A   Occupational History  . scrap metal   . disabled, odd jobs    Social History Main Topics  . Smoking status: Former Smoker    Packs/day: 0.50    Years: 4.00    Types: Cigarettes  . Smokeless tobacco: Never Used     Comment: about a pack a week. social  . Alcohol use 25.2 oz/week    42 Cans of beer per week     Comment: 6 beers per day  . Drug use: Yes    Frequency: 7.0 times per week    Types: Codeine, Marijuana     Comment: has discontinued  . Sexual activity: Yes    Partners: Female   Other Topics Concern  . Not on file   Social History Narrative   ** Merged History Encounter **        Family  History:    Family History  Problem Relation Age of Onset  . Other Mother        cardiac surgery. late 1990s  . Heart Problems Mother        CABG AGE 49  . Congestive Heart Failure Father   . Healthy Father        AGE 54  . Coronary artery disease Unknown   . Healthy Brother        AGE 29  . Healthy Sister        AGE 4  . Healthy Sister        AGE 93  . Healthy Son   . Healthy Son   . Healthy Daughter      ROS:  Please see the history of present illness.  ROS  All other ROS reviewed and negative.     Physical Exam/Data:   Vitals:   05/30/17 1500 05/30/17 1530 05/30/17 1545 05/30/17 1615  BP: 100/73 121/82 133/65 115/71  Pulse: (!) 38 79 78 76  Resp: 20 20 (!) 23 18  Temp:      TempSrc:      SpO2: 97% 99% 99% 98%   General:  Well nourished, well developed, in no acute physical distress, though emotional to hear his devic findings HEENT: normal Lymph: no adenopathy Neck: 6 cm JVD Endocrine:  No thryomegaly Vascular: No carotid bruits Cardiac:  RRR; no murmur , soft SM, no gallops or rubs Lungs:  CTA b/l, no wheezing, rhonchi or rales  Abd: soft, nontender  Ext: no edema Musculoskeletal:  No deformities, BUE and BLE strength normal and equal Skin: warm and dry  Neuro:   no focal abnormalities noted Psych:  Normal affect   EKG:  The EKG was personally reviewed and demonstrates:   SR/Vpaced Telemetry:  Telemetry was personally reviewed and demonstrates:   SR, V paced  Relevant CV Studies:  12/26/16: TTE Study Conclusions - Left ventricle: The cavity size was moderately dilated. Systolic   function was severely reduced. The estimated ejection fraction   was 15%. Diffuse hypokinesis. Doppler parameters are consistent   with restrictive physiology, indicative of decreased left   ventricular diastolic compliance and/or increased left atrial   pressure. - Aortic root: The aortic root was mildly dilated. - Mitral valve: Calcified annulus. There was mild  regurgitation. - Left atrium: The atrium was severely dilated. - Pericardium, extracardiac: A trivial pericardial effusion was   identified. Impressions: - Technically difficult; severe global reduction in LV systolic   function; restrictive filling; mildly dilated aortic root; mild  MR; severe LAE  04/08/15: LHC  Nonobstructive coronary artery disease.  Elevated LVEDP  Moderate pulmonary artery hypertension.  Cardiac output 5.5 L/min; CI 2.1.  Laboratory Data:  Chemistry  Recent Labs Lab 05/30/17 1118  NA 133*  K 4.1  CL 101  CO2 26  GLUCOSE 109*  BUN 15  CREATININE 0.92  CALCIUM 9.2  GFRNONAA >60  GFRAA >60  ANIONGAP 6    No results for input(s): PROT, ALBUMIN, AST, ALT, ALKPHOS, BILITOT in the last 168 hours. Hematology  Recent Labs Lab 05/30/17 1118  WBC 4.1  RBC 3.50*  HGB 11.0*  HCT 32.4*  MCV 92.6  MCH 31.4  MCHC 34.0  RDW 14.5  PLT 170   Cardiac EnzymesNo results for input(s): TROPONINI in the last 168 hours.   Recent Labs Lab 05/30/17 1141 05/30/17 1608  TROPIPOC 0.17* 0.13*    BNPNo results for input(s): BNP, PROBNP in the last 168 hours.  DDimer No results for input(s): DDIMER in the last 168 hours.  Radiology/Studies:  Dg Chest 2 View Result Date: 05/30/2017 CLINICAL DATA:  Cardiac arrhythmia with chest pain EXAM: CHEST  2 VIEW COMPARISON:  Jan 29, 2017. FINDINGS: Lungs are clear. Heart is upper normal in size with pulmonary vascularity within normal limits. Pacemaker leads are attached to the right atrium, right ventricle, and coronary sinus. No adenopathy. There is degenerative change in the thoracic spine. IMPRESSION: Pacemaker leads as described. Heart upper normal in size. No edema or consolidation. Electronically Signed   By: Bretta Bang III M.D.   On: 05/30/2017 11:37    Assessment and Plan:   1. VT     K+ ok, check mag     No anginal symptoms, no significant CAD by cath July 2017, Trop elevation likely 2/2 shocks/VT      Last LVEF 15%  Amio gtt (no bolus), admit to telemetry Failed shocks, may benefit from Sotalol to reduce defib thresholds, Dr. Ladona Ridgel will d/w Dr. Graciela Husbands  Patient somewhat emotional about shocks/arrhythmias, I will discuss with him tomorrow driving restrictions.  2. NICM, chronic CHF     Exam, xray, optivol all look compensated     Continue home regime  3. HTN     continue home meds   For questions or updates, please contact [facility] Please consult [email or web address] for contact info under Cardiology/STEMI.   Signed, Sheilah Pigeon, PA-C  05/30/2017 5:04 PM;  EP Attending  Patient seen and examined. Agree with above. The patient has had hemodynamically unstable VT which did not terminate with ATP and for which he was shocked 4 times total after the first was unsuccessful. The VT was 290 ms and then accelerated. He has felt well up until the VT. He denies chest pain or sob. His exam is accurately noted above.   A/P 1. VT - he is on amiodarone and will start him on additional IV amiodarone. 2. High DFT - he appears to be euvolemic but has been followed by the CHF clinic. However, sotalol would be one way to reduce his DFT. I will discuss with Dr. Crissie Sickles. 3. Chronic systolic heart failure - his symptoms are well controlled. He will continue his current meds.  Leonia Reeves.D.

## 2017-05-30 NOTE — ED Triage Notes (Signed)
Pt reports feeling his defibrillator fire last night, states his doctors office told him this morning the readings showed 3 incidences of it firing. Pt denies any pain. A/ox4, resp e/u, nad.

## 2017-05-30 NOTE — ED Provider Notes (Signed)
Blood pressure 115/71, pulse 76, temperature 98.1 F (36.7 C), temperature source Oral, resp. rate 18, SpO2 98 %.  Assuming care from Dr. Adriana Simas.  In short, [name] [name] is a 56 y.o. male with a chief complaint of AICD Problem .  Refer to the original H&P for additional details.  The current plan of care is to follow Cardiology recommendations.  04:10 PM Cardiology seeing the patient currently. I received [employer] report and reviewed this. Patient had prolonged run of ventricular tachycardia which was the reason for defibrillation. Troponin slightly elevated. Cardiology to admit.   Discussed patient's case with Cardiology to request admission. Patient and family (if present) updated with plan. Care transferred to Cardiology service.  I reviewed all nursing notes, vitals, pertinent old records, EKGs, labs, imaging (as available).  Alona Bene, MD   Maia Plan, MD 05/30/17 [phone]

## 2017-05-30 NOTE — ED Notes (Signed)
I-stat troponin result given to Dr. Long 

## 2017-05-31 DIAGNOSIS — I5022 Chronic systolic (congestive) heart failure: Secondary | ICD-10-CM

## 2017-05-31 LAB — BASIC METABOLIC PANEL
ANION GAP: 8 (ref 5–15)
BUN: 16 mg/dL (ref 6–20)
CO2: 28 mmol/L (ref 22–32)
Calcium: 8.7 mg/dL — ABNORMAL LOW (ref 8.9–10.3)
Chloride: 102 mmol/L (ref 101–111)
Creatinine, Ser: 0.97 mg/dL (ref 0.61–1.24)
GFR calc Af Amer: >60 mL/min (ref >60)
GLUCOSE: 103 mg/dL — AB (ref 65–99)
POTASSIUM: 3.6 mmol/L (ref 3.5–5.1)
SODIUM: 138 mmol/L (ref 135–145)

## 2017-05-31 NOTE — Progress Notes (Signed)
BP Running low 100/58. Still on Amio gtt. Notified MD on call. Okay to hold Oral Entresto and Betapace this evening. Will continue to monitor.

## 2017-05-31 NOTE — Progress Notes (Signed)
This note also relates to the following rows which could not be included: Resp - Cannot attach notes to unvalidated device data  Pt placed on CPAP and tolerating well.

## 2017-05-31 NOTE — Progress Notes (Signed)
Advanced Heart Failure Rounding Note  Primary Cardiologist: Dr. Antoine Poche  Primary HF: Dr. [name]  Primary EP: Dr. Graciela Husbands  Subjective:    Admitted 05/30/17 with VT with shock x 4 and syncope. MG 2.3 and K 4.1 on arrival.   Feeling great today. He states he was going about his usual business yesterday and felt great. Mowed the yard, had 4 beers.  Fell asleep at the TV and woke with VT -> shock. Denies SOB, weight gain, lightheadedness or .dizziness (outside of VT episode).  Weight has been stable at home and he has felt great overall.   Objective:   Weight Range: 299 lb 1.6 oz (135.7 kg) Body mass index is 36.41 kg/m.   Vital Signs:   Temp:  [97.6 F (36.4 C)-98.1 F (36.7 C)] 97.9 F (36.6 C) (09/13 0521) Pulse Rate:  [33-84] 66 (09/13 0521) Resp:  [11-23] 11 (09/13 0521) BP: (92-133)/(48-84) 123/70 (09/13 0521) SpO2:  [97 %-100 %] 100 % (09/13 0521) Weight:  [299 lb 1.6 oz (135.7 kg)] 299 lb 1.6 oz (135.7 kg) (09/13 0521) Last BM Date: 05/30/17  Weight change: Filed Weights   05/30/17 2105 05/31/17 0521  Weight: 299 lb 1.6 oz (135.7 kg) 299 lb 1.6 oz (135.7 kg)   Intake/Output:   Intake/Output Summary (Last 24 hours) at 05/31/17 0937 Last data filed at 05/31/17 0745  Gross per 24 hour  Intake           224.02 ml  Output              800 ml  Net          -575.98 ml      Physical Exam    General:  Well appearing. No resp difficulty HEENT: Normal Neck: Supple. JVP 6-7 cm. Carotids 2+ bilat; no bruits. No lymphadenopathy or thyromegaly appreciated. Cor: PMI nondisplaced. Regular rate & rhythm. No rubs, gallops or murmurs. Lungs: Clear Abdomen: Soft, nontender, nondistended. No hepatosplenomegaly. No bruits or masses. Good bowel sounds. Extremities: No cyanosis, clubbing, rash, or edema. Neuro: Alert & orientedx3, cranial nerves grossly intact. moves all 4 extremities w/o difficulty. Affect pleasant  Telemetry   V paced 70s, Personally reviewed  EKG      Vpaced 76 bpm on admit, Personally reviewed.   Labs    CBC  Recent Labs  05/30/17 1118  WBC 4.1  HGB 11.0*  HCT 32.4*  MCV 92.6  PLT 170   Basic Metabolic Panel  Recent Labs  05/30/17 1118 05/30/17 1733 05/31/17 0208  NA 133*  --  138  K 4.1  --  3.6  CL 101  --  102  CO2 26  --  28  GLUCOSE 109*  --  103*  BUN 15  --  16  CREATININE 0.92  --  0.97  CALCIUM 9.2  --  8.7*  MG  --  2.3  --    Liver Function Tests No results for input(s): AST, ALT, ALKPHOS, BILITOT, PROT, ALBUMIN in the last 72 hours. No results for input(s): LIPASE, AMYLASE in the last 72 hours. Cardiac Enzymes No results for input(s): CKTOTAL, CKMB, CKMBINDEX, TROPONINI in the last 72 hours.  BNP: BNP (last 3 results)  Recent Labs  12/25/16 0600 01/24/17 1042 01/29/17 0210  BNP 1,193.9* 436.3* 557.1*    ProBNP (last 3 results) No results for input(s): PROBNP in the last 8760 hours.   D-Dimer No results for input(s): DDIMER in the last 72 hours. Hemoglobin A1C No results  for input(s): HGBA1C in the last 72 hours. Fasting Lipid Panel No results for input(s): CHOL, HDL, LDLCALC, TRIG, CHOLHDL, LDLDIRECT in the last 72 hours. Thyroid Function Tests No results for input(s): TSH, T4TOTAL, T3FREE, THYROIDAB in the last 72 hours.  Invalid input(s): FREET3  Other results:   Imaging    Dg Chest 2 View  Result Date: 05/30/2017 CLINICAL DATA:  Cardiac arrhythmia with chest pain EXAM: CHEST  2 VIEW COMPARISON:  Jan 29, 2017. FINDINGS: Lungs are clear. Heart is upper normal in size with pulmonary vascularity within normal limits. Pacemaker leads are attached to the right atrium, right ventricle, and coronary sinus. No adenopathy. There is degenerative change in the thoracic spine. IMPRESSION: Pacemaker leads as described. Heart upper normal in size. No edema or consolidation. Electronically Signed   By: Bretta Bang III M.D.   On: 05/30/2017 11:37      Medications:     Scheduled  Medications: . aspirin  81 mg Oral Daily  . atorvastatin  80 mg Oral q1800  . FLUoxetine  20 mg Oral Daily  . potassium chloride SA  40 mEq Oral BID  . rivaroxaban  20 mg Oral Q supper  . sacubitril-valsartan  1 tablet Oral BID  . sotalol  80 mg Oral Q12H  . spironolactone  25 mg Oral Daily  . torsemide  60 mg Oral BID     Infusions: . amiodarone 30 mg/hr (05/31/17 0003)     PRN Medications:  acetaminophen, albuterol, nitroGLYCERIN, ondansetron (ZOFRAN) IV    Patient Profile   [name] [name] is a 56 y.o. male with a past medical history of chronic systolic CHF (last EF 15% in April 2018), dilated NICM s/p bi V ICD, PAF with intermittent compliance with Xarelto, ETOH abuse, tobacco abuse, OSA on CPAP, and HTN.   Admitted for VT with appropriate shock.   Assessment/Plan   1. VT with shock x 4 - 3 ineffective shocks, 4th shock with return to sinus - Bolused with IV amio and started on sotalol - Appreciate EP management - K 3.6 this am. Goal K > 4.0 and Mg > 2.0 2. Chronic combined systolic and diastolic CHF: NICM, Echo 12/2016 EF 15%. Normal cors in July 2016. ETOH vs. Hypertension - NYHA II - Volume status looks OK by exam and optivol.  - Continue home dose torsemide at 60 mg BID.  - Continue Entresto 49/51 mg BID.    - Has Bi-V ICD, follows with Dr. Graciela Husbands - Continue Cleda Daub  daily.  - Intolerant to Coreg with hypotension.  - Reinforced fluid restriction to < 2 L daily, sodium restriction to less than 2000 mg daily, and the importance of daily weights.   3. PAF - Continue Amiodarone, he was advised to get a yearly eye exam. Now on sotalol as well for VT.  - LFTs/TSH OK recently.  -This patients CHA2DS2-VASc is at least 2.  - Continue Xarelto for anticoagulation.  - Denies bleeding.  4. ETOH abuse - Encouraged complete cessation.   5. Tobacco abuse - Encouraged complete cessation.  6. Substance abuse - Continues to smoke marijuana daily.  - Refuses cessation at  this time.   He is overall well compensated from a HF perspective. We will follow at a distance and arrange outpatient follow up in the HF clinic.   Length of Stay: 1  Luane School  05/31/2017, 9:37 AM  Advanced Heart Failure Team Pager [phone] (M-F; 7a - 4p)  Please contact [facility] for  night-coverage after hours (4p -7a ) and weekends on [email or web address]  Patient seen and examined with the above-signed Advanced Practice Provider and/or Housestaff. I personally reviewed laboratory data, imaging studies and relevant notes. I independently examined the patient and formulated the important aspects of the plan. I have edited the note to reflect any of my changes or salient points. I have personally discussed the plan with the patient and/or family.  Admitted with VT and ICD shock. Started on amiodarone and sotalol.   Volume status stable. Remains NYHA II. We will follow as needed. Continue current HF regimen.  Arvilla Meres, MD  5:37 PM

## 2017-05-31 NOTE — Progress Notes (Signed)
Pt states he already took his home meds today, Xarelto, Entresto, and Potassium. Educated pt not to take own medication. Per Pt, fiance will take meds home tomorrow.

## 2017-05-31 NOTE — Plan of Care (Signed)
Problem: Pain Managment: Goal: General experience of comfort will improve Outcome: Progressing Pt denies any pain at this time. Will continue to monitor  Problem: Physical Regulation: Goal: Ability to maintain clinical measurements within normal limits will improve Outcome: Progressing Pt remained AV paced overnight. No events w/ telemetry. Ot denies any pain. Amiodarone gtt running at set rate. VSS. Will continue to monitor.

## 2017-05-31 NOTE — Progress Notes (Signed)
Progress Note  Patient Name: [name] [name] Date of Encounter: 05/31/2017  Primary Cardiologist: Dr. Antoine Poche CHF: Dr. [name] EP: Dr. Graciela Husbands  Subjective   Feels well, no CP, palpitations or SOB  Inpatient Medications    Scheduled Meds: . aspirin  81 mg Oral Daily  . atorvastatin  80 mg Oral q1800  . FLUoxetine  20 mg Oral Daily  . potassium chloride SA  40 mEq Oral BID  . rivaroxaban  20 mg Oral Q supper  . sacubitril-valsartan  1 tablet Oral BID  . sotalol  80 mg Oral Q12H  . spironolactone  25 mg Oral Daily  . torsemide  60 mg Oral BID   Continuous Infusions: . amiodarone 30 mg/hr (05/31/17 0003)   PRN Meds: acetaminophen, albuterol, nitroGLYCERIN, ondansetron (ZOFRAN) IV   Vital Signs    Vitals:   05/30/17 1945 05/30/17 2105 05/31/17 0033 05/31/17 0521  BP: (!) 109/58 100/84  123/70  Pulse: 70   66  Resp: 19 (!) Temp:  97.6 F (36.4 C)  97.9 F (36.6 C)  TempSrc:  Oral  Axillary  SpO2: 97% 99% 99% 100%  Weight:  299 lb 1.6 oz (135.7 kg)  299 lb 1.6 oz (135.7 kg)  Height:   (1.93 m)      Intake/Output Summary (Last 24 hours) at 05/31/17 0930 Last data filed at 05/31/17 0745  Gross per 24 hour  Intake           224.02 ml  Output              800 ml  Net          -575.98 ml   Filed Weights   05/30/17 2105 05/31/17 0521  Weight: 299 lb 1.6 oz (135.7 kg) 299 lb 1.6 oz (135.7 kg)    Telemetry    SR, V paced, no VT - Personally Reviewed  ECG    no new EKGs - Personally Reviewed  Physical Exam   GEN: No acute distress.   Neck: No JVD Cardiac: RRR, no murmurs, rubs, or gallops.  Respiratory: CTA b/l. GI: Soft, nontender, non-distended  MS: No edema; No deformity. Neuro:  Nonfocal  Psych: Normal affect   Labs    Chemistry Recent Labs Lab 05/30/17 1118 05/31/17 0208  NA 133* 138  K 4.1 3.6  CL 101 102  CO2 26 28  GLUCOSE 109* 103*  BUN 15 16  CREATININE 0.92 0.97  CALCIUM 9.2 8.7*  GFRNONAA >60 >60  GFRAA >60 >60    ANIONGAP 6 8     Hematology Recent Labs Lab 05/30/17 1118  WBC 4.1  RBC 3.50*  HGB 11.0*  HCT 32.4*  MCV 92.6  MCH 31.4  MCHC 34.0  RDW 14.5  PLT 170    Cardiac EnzymesNo results for input(s): TROPONINI in the last 168 hours.  Recent Labs Lab 05/30/17 1141 05/30/17 1608  TROPIPOC 0.17* 0.13*     BNPNo results for input(s): BNP, PROBNP in the last 168 hours.   DDimer No results for input(s): DDIMER in the last 168 hours.   Radiology      Cardiac Studies   No new studies  12/26/16: TTE Study Conclusions - Left ventricle: The cavity size was moderately dilated. Systolic function was severely reduced. The estimated ejection fraction was 15%. Diffuse hypokinesis. Doppler parameters are consistent with restrictive physiology, indicative of decreased left ventricular diastolic compliance and/or increased left atrial pressure. - Aortic root: The aortic root was  mildly dilated. - Mitral valve: Calcified annulus. There was mild regurgitation. - Left atrium: The atrium was severely dilated. - Pericardium, extracardiac: A trivial pericardial effusion was identified. Impressions: - Technically difficult; severe global reduction in LV systolic function; restrictive filling; mildly dilated aortic root; mild MR; severe LAE  04/08/15: LHC  Nonobstructive coronary artery disease.  Elevated LVEDP  Moderate pulmonary artery hypertension.  Cardiac output 5.5 L/min; CI 2.1.  Patient Profile     56 y.o. male with a hx of chronic systolic CHF (last EF 15% in April 2018), dilated NICM s/p bi V ICD, PAF on Xarelto, ETOH abuse, tobacco abuse, OSA on CPAP, and HTN  who admitted admitted 2/2 VT.  Device information MDT CRT-D, implanted 04/14/15, Dr. Graciela Husbands + known PMVT hx (and AF) AAD amiodarone  Device interrogation: VT failed 3 ATPs, third accelerated VT to VF, 3 failed shocks at max output 4th restored SR  Assessment & Plan    1. VT     K+ and  Mag ok     No anginal symptoms, no significant CAD by cath July 2017, Trop elevation likely 2/2 shocks/VT     Last LVEF 15%  On amiodarone gtt and sotalol started in effort to reduce DFT's given 3 failed shocks Will get K+ this AM, daily EKGs/labs Will continue IV amio for 24hours, plan switch to PO tomorrow  The patient is made aware of [place] law, no driving 6 months, states understanding  2. NICM, chronic CHF     Exam remains euvolemic     Xray, optivol all look compensated     Continue home regime  3. HTN     looks OK, continue home meds    For questions or updates, please contact [facility] Please consult [email or web address] for contact info under Cardiology/STEMI.      Signed, Sheilah Pigeon, PA-C  05/31/2017, 9:30 AM    I reviewed the patient's telemetry and device interrogation. 3 failed shocks at maximum output. Sotalol is reasonable. The chest x-ray was also reviewed. Given his COPD there is a fair amount of retrosternal air and the heart was displaced posteriorly to the relatively anterior location of his ICD. I have reviewed with him issue of alcohol abstention and the possibility of assessing effectiveness of sotalol reducing defibrillation thresholds.  We discussed the fact that there might be need for different vector with the placing of an SVC/innominate vein coil. At this point he is more inclined to stay for alcohol intake the sotalol.

## 2017-06-01 LAB — BASIC METABOLIC PANEL
ANION GAP: 8 (ref 5–15)
BUN: 13 mg/dL (ref 6–20)
CALCIUM: 8.8 mg/dL — AB (ref 8.9–10.3)
CO2: 26 mmol/L (ref 22–32)
Chloride: 104 mmol/L (ref 101–111)
Creatinine, Ser: 1.02 mg/dL (ref 0.61–1.24)
Glucose, Bld: 116 mg/dL — ABNORMAL HIGH (ref 65–99)
Potassium: 3.7 mmol/L (ref 3.5–5.1)
SODIUM: 138 mmol/L (ref 135–145)

## 2017-06-01 LAB — MAGNESIUM: Magnesium: 2.1 mg/dL (ref 1.7–2.4)

## 2017-06-01 MED ORDER — AMIODARONE HCL 200 MG PO TABS
400.0000 mg | ORAL_TABLET | Freq: Two times a day (BID) | ORAL | Status: DC
  Administered 2017-06-01 – 2017-06-02 (×3): 400 mg via ORAL
  Filled 2017-06-01 (×3): qty 2

## 2017-06-01 MED ORDER — POTASSIUM CHLORIDE CRYS ER 20 MEQ PO TBCR
20.0000 meq | EXTENDED_RELEASE_TABLET | Freq: Once | ORAL | Status: AC
  Administered 2017-06-01: 20 meq via ORAL
  Filled 2017-06-01: qty 1

## 2017-06-01 MED ORDER — SACUBITRIL-VALSARTAN 24-26 MG PO TABS
1.0000 | ORAL_TABLET | Freq: Two times a day (BID) | ORAL | Status: DC
  Administered 2017-06-01 – 2017-06-02 (×2): 1 via ORAL
  Filled 2017-06-01 (×3): qty 1

## 2017-06-01 MED ORDER — POTASSIUM CHLORIDE CRYS ER 20 MEQ PO TBCR: 40.0000 meq | EXTENDED_RELEASE_TABLET | Freq: Once | ORAL | Status: DC

## 2017-06-01 NOTE — Progress Notes (Signed)
   Rhythm, weight and vitals stable.   No acute HF.   Agree with plan as per Dr. Graciela Husbands.   The HF team will be available as needed.   Arvilla Meres, MD  9:01 AM

## 2017-06-01 NOTE — Progress Notes (Signed)
Electrophysiology Rounding Note  Patient Name: [name] [name] Date of Encounter: 06/01/2017  Primary Cardiologist: Shirlee Latch Electrophysiologist: Graciela Husbands   Subjective   The patient is doing well today.  At this time, the patient denies chest pain, shortness of breath, or any new concerns.  Inpatient Medications    Scheduled Meds: . aspirin  81 mg Oral Daily  . atorvastatin  80 mg Oral q1800  . FLUoxetine  20 mg Oral Daily  . potassium chloride SA  40 mEq Oral BID  . rivaroxaban  20 mg Oral Q supper  . sacubitril-valsartan  1 tablet Oral BID  . sotalol  80 mg Oral Q12H  . spironolactone  25 mg Oral Daily  . torsemide  60 mg Oral BID   Continuous Infusions: . amiodarone 30 mg/hr (05/31/17 2333)   PRN Meds: acetaminophen, albuterol, nitroGLYCERIN, ondansetron (ZOFRAN) IV   Vital Signs    Vitals:   05/31/17 1410 05/31/17 2027 05/31/17 2200 06/01/17 0448  BP: (!) 99/55 (!) 96/51  113/66  Pulse: 63 64    Resp:  14 12 20   Temp: (!) 97.2 F (36.2 C) 97.8 F (36.6 C)  98 F (36.7 C)  TempSrc: Oral Oral  Oral  SpO2: 99% 100%  100%  Weight:    292 lb 12.8 oz (132.8 kg)  Height:        Intake/Output Summary (Last 24 hours) at 06/01/17 0655 Last data filed at 06/01/17 0500  Gross per 24 hour  Intake           1377.5 ml  Output             3650 ml  Net          -2272.5 ml   Filed Weights   05/30/17 2105 05/31/17 0521 06/01/17 0448  Weight: 299 lb 1.6 oz (135.7 kg) 299 lb 1.6 oz (135.7 kg) 292 lb 12.8 oz (132.8 kg)    Physical Exam    GEN- The patient is well appearing, alert and oriented x 3 today.   Head- normocephalic, atraumatic Eyes-  Sclera clear, conjunctiva pink Ears- hearing intact Oropharynx- clear Neck- supple Lungs- Clear to ausculation bilaterally, normal work of breathing Heart- Regular rate and rhythm (paced) GI- soft, NT, ND, + BS Extremities- no clubbing, cyanosis, or edema Skin- no rash or lesion Psych- euthymic mood, full affect Neuro-  strength and sensation are intact  Labs    CBC  Recent Labs  05/30/17 1118  WBC 4.1  HGB 11.0*  HCT 32.4*  MCV 92.6  PLT 170   Basic Metabolic Panel  Recent Labs  05/30/17 1733 05/31/17 0208 06/01/17 0314  NA  --  138 138  K  --  3.6 3.7  CL  --  102 104  CO2  --  28 26  GLUCOSE  --  103* 116*  BUN  --  16 13  CREATININE  --  0.97 1.02  CALCIUM  --  8.7* 8.8*  MG 2.3  --  2.1    Telemetry    AV pacing, PVC's (personally reviewed)  Radiology    Dg Chest 2 View  Result Date: 05/30/2017 CLINICAL DATA:  Cardiac arrhythmia with chest pain EXAM: CHEST  2 VIEW COMPARISON:  Jan 29, 2017. FINDINGS: Lungs are clear. Heart is upper normal in size with pulmonary vascularity within normal limits. Pacemaker leads are attached to the right atrium, right ventricle, and coronary sinus. No adenopathy. There is degenerative change in the thoracic spine. IMPRESSION: Pacemaker leads  as described. Heart upper normal in size. No edema or consolidation. Electronically Signed   By: Bretta Bang III M.D.   On: 05/30/2017 11:37     Patient Profile     56 y.o. male with a hx of chronic systolic CHF (last EF 15% in April 2018), dilated NICM s/p bi V ICD, PAF onXarelto, ETOH abuse, tobacco abuse, OSA on CPAP, and HTNwho admitted admitted 2/2 VT. ICD shocks failed, placed on Sotalol to lower DFT's  Assessment & Plan    1.  Ventricular tachycardia With appropriate ICD therapy, initially shocks failed to convert to SR - placed on Sotalol to lower DFTs Will change IV amio to po today (  twice daily for 1 week, then  twice daily) Continue Sotalol - held last night 2/2 hypotension. Will ambulate today and see how he feels Keep K >3.9, Mg >1.8 QTc stable with Sotalol initiation No driving x6 months  2.  Chronic systolic heart failure AHF team has seen this admission Continue current therapy  3.  HTN Hypotensive on Sotalol Will follow today May need to decrease  Entresto  4. Paroxysmal atrial fibrillation Maintaining SR Continue Xarelto for CHADS2VASC of 2  5.  ETOH abuse Cessation advised   Anticipate discharge to home tomorrow. Early EP/AHF follow arranged   Signed, Gypsy Balsam, NP  06/01/2017, 6:55 AM   Seen and examined  Amio IV>>PO Sotalol is major need here, hence will decrease entresto so as to resume sotalol  Home in am if BP and rhythm stable

## 2017-06-01 NOTE — Progress Notes (Signed)
Cardiology  Called about patient having BP's in the 97/56. Discussed with nursing that we are ok to the continue the amiodarone, but can hold the night dose of entresto.  Patient remains asymptomatic at these pressures.

## 2017-06-01 NOTE — Progress Notes (Signed)
Notified MD of BP 101/61. Per MD hold Entresto.  Will continue to monitor.

## 2017-06-02 LAB — BASIC METABOLIC PANEL
Anion gap: 10 (ref 5–15)
BUN: 14 mg/dL (ref 6–20)
CHLORIDE: 103 mmol/L (ref 101–111)
CO2: 25 mmol/L (ref 22–32)
CREATININE: 1.04 mg/dL (ref 0.61–1.24)
Calcium: 8.9 mg/dL (ref 8.9–10.3)
GFR calc non Af Amer: >60 mL/min (ref >60)
Glucose, Bld: 128 mg/dL — ABNORMAL HIGH (ref 65–99)
Potassium: 3.7 mmol/L (ref 3.5–5.1)
Sodium: 138 mmol/L (ref 135–145)

## 2017-06-02 LAB — MAGNESIUM: Magnesium: 2.2 mg/dL (ref 1.7–2.4)

## 2017-06-02 MED ORDER — SACUBITRIL-VALSARTAN 24-26 MG PO TABS: 1.0000 | ORAL_TABLET | Freq: Two times a day (BID) | ORAL | 6 refills | Status: DC

## 2017-06-02 MED ORDER — POTASSIUM CHLORIDE CRYS ER 20 MEQ PO TBCR: 40.0000 meq | EXTENDED_RELEASE_TABLET | Freq: Two times a day (BID) | ORAL | 6 refills | Status: DC

## 2017-06-02 MED ORDER — SOTALOL HCL 80 MG PO TABS: 80.0000 mg | ORAL_TABLET | Freq: Two times a day (BID) | ORAL | 6 refills | Status: DC

## 2017-06-02 MED ORDER — AMIODARONE HCL 200 MG PO TABS: 400.0000 mg | ORAL_TABLET | Freq: Two times a day (BID) | ORAL | 6 refills | Status: DC

## 2017-06-02 NOTE — Discharge Summary (Signed)
Discharge Summary    Patient ID: [name] [name],  MRN: [id number], DOB/AGE: [birth year] 56 y.o.  Admit date: 05/30/2017 Discharge date: 06/02/2017  Primary Care Provider: [name] Primary Cardiologist: Dr. Daiva Nakayama EP:  Dr. Graciela Husbands CHF:  Dr. Shirlee Latch  Discharge Diagnoses    Principal Problem:   VT (ventricular tachycardia) (HCC) Active Problems:   Atrial fibrillation with RVR - Paroxysmal   Tobacco use disorder- 1/2 pk a day   H/O ETOH abuse 6-8 beers a day   NICM- EF 25% 2012 & 20-25% 2016   S/P ICD (internal cardiac defibrillator) procedure Medtronic CRT ICD   Acute on chronic systolic CHF (congestive heart failure) (HCC)   Allergies No Known Allergies  Diagnostic Studies/Procedures    none _____________    History of Present Illness     [name] [name] is a 56 y.o. male with a hx of chronic systolic CHF (last EF 15% in April 2018), dilated NICM s/p bi V ICD, PAF on Xarelto, ETOH abuse, tobacco abuse, OSA on CPAP, and HTN  who was admitted 2/2 VT.  He has not had any kind of CP, palpitations, or particular exertional intolerances.  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  He stated of late he has been feeling well.  Burgess Estelle was a usual day, nothing unusual and reports compliance with all of his medicines and specifically he says this regime has kept his fluid levels good and has kept up with them, happy with the current regime.  He went to bed last night as usual, woke about 10PM feeling SOB, became lightheaded seated at the edge of his bed then diaphoretic, leaned over to steady himself on the wall/window across from him and got shocked, then again, leaned back and the next thing he knew was waking up.  (he was unaware of the 3rd/4th shock).  He collected himself, feeling well upon waking went to sleep.  Was called Dr. Lubertha Basque office 05/30/17 and recommended to go to the ER which he did.  He was  started on IV amiodarone and he was on outpt amiodarone po.  He had high DFT on admit and HF consulted, pt was euvolemic.   Hospital Course     Consultants: HFT    1. VT with shock x 4 - 3 ineffective shocks, 4th shock with return to sinus - Bolused with IV amio and started on sotalol - K 3.7 this am. Goal K > 4.0 and Mg > 2.0 today at d/c 2.2 -Qtc 538 ms stable - no driving for 6 months.  2. Chronic combined systolic and diastolic CHF: NICM, Echo 12/2016 EF 15%. Normal cors in July 2016. ETOH vs. Hypertension - NYHA II - Volume status looks OK by exam and optivol.  - Continue home dose torsemide at 60 mg BID.  - Continue Entresto decreased dose due to sotalol now 24-26 mg BID.   - Has Bi-V ICD, follows with Dr. Graciela Husbands - Continue Cleda Daub 25mg  daily. demadex 60 BID  - Intolerant to Coreg with hypotension.  - Reinforced fluid restriction to < 2 L daily, sodium restriction to less than 2000 mg daily, and the importance of daily weights.   -wt down 8 lbs here  3. PAF - Continue Amiodarone, he was advised to get a yearly eye exam. Now on sotalol as well for VT. Maintaining SR- AV paced  - LFTs/TSH OK recently.  -This patients CHA2DS2-VASc is at  least 2.  - Continue Xarelto for anticoagulation.  - Denies bleeding.   4. ETOH abuse - Encouraged complete cessation.    5. Tobacco abuse - Encouraged complete cessation.   6. Substance abuse - Continues to smoke marijuana daily.  - Refuses cessation at this time.   Pt seen and evaluated by Dr. Graciela Husbands and stable for discharge.  Follow up as below.  _____________  Discharge Vitals Blood pressure 105/62, pulse 60, temperature 98.1 F (36.7 C), temperature source Oral, resp. rate 13, height  (1.93 m), weight 291 lb 0.1 oz (132 kg), SpO2 100 %.  Filed Weights   05/31/17 0521 06/01/17 0448 06/02/17 0629  Weight: 299 lb 1.6 oz (135.7 kg) 292 lb 12.8 oz (132.8 kg) 291 lb 0.1 oz (132 kg)  General:Pleasant affect, NAD Skin:Warm and dry,  brisk capillary refill HEENT:normocephalic, sclera clear, mucus membranes moist Neck:supple, no JVD, no bruits  Heart:S1S2 RRR without murmur, gallup, rub or click Lungs:clear without rales, rhonchi, or wheezes ZOX:WRUE, non tender, + BS, do not palpate liver spleen or masses Ext:no lower ext edema, 2+ pedal pulses, 2+ radial pulses Neuro:alert and oriented X 3, MAE, follows commands, + facial symmetry     Labs & Radiologic Studies    CBC No results for input(s): WBC, NEUTROABS, HGB, HCT, MCV, PLT in the last 72 hours. Basic Metabolic Panel  Recent Labs  06/01/17 0314 06/02/17 0349  NA 138 138  K 3.7 3.7  CL 104 103  CO2 26 25  GLUCOSE 116* 128*  BUN 13 14  CREATININE 1.02 1.04  CALCIUM 8.8* 8.9  MG 2.1 2.2   Liver Function Tests No results for input(s): AST, ALT, ALKPHOS, BILITOT, PROT, ALBUMIN in the last 72 hours. No results for input(s): LIPASE, AMYLASE in the last 72 hours. Cardiac Enzymes No results for input(s): CKTOTAL, CKMB, CKMBINDEX, TROPONINI in the last 72 hours. BNP Invalid input(s): POCBNP D-Dimer No results for input(s): DDIMER in the last 72 hours. Hemoglobin A1C No results for input(s): HGBA1C in the last 72 hours. Fasting Lipid Panel No results for input(s): CHOL, HDL, LDLCALC, TRIG, CHOLHDL, LDLDIRECT in the last 72 hours. Thyroid Function Tests No results for input(s): TSH, T4TOTAL, T3FREE, THYROIDAB in the last 72 hours.  Invalid input(s): FREET3 _____________  Dg Chest 2 View  Result Date: 05/30/2017 CLINICAL DATA:  Cardiac arrhythmia with chest pain EXAM: CHEST  2 VIEW COMPARISON:  Jan 29, 2017. FINDINGS: Lungs are clear. Heart is upper normal in size with pulmonary vascularity within normal limits. Pacemaker leads are attached to the right atrium, right ventricle, and coronary sinus. No adenopathy. There is degenerative change in the thoracic spine. IMPRESSION: Pacemaker leads as described. Heart upper normal in size. No edema or  consolidation. Electronically Signed   By: Bretta Bang III M.D.   On: 05/30/2017 11:37   Disposition   Pt is being discharged home today in good condition.  Follow-up Plans & Appointments    Follow-up Information    [facility] Follow up on 06/11/2017.   Specialty:  Cardiology Why:  at 0900 for post hospital follow up. Please bring all medications. Code for parking [id number] Contact information: [address] [id number] [place]c [place] [address] [phone]       [name], MD Follow up on 06/08/2017.   Specialty:  Cardiology Why:  with his PA  at 1:00 PM  Contact information: [address] [place] [address] [phone]  Weigh daily and call for weight increase of 3 lbs in a day or 5 lbs in a week Heart Healthy 2000 mg sodium diet  Only 2 L of liquid per day   No driving for 6 months.  Stop alcohol.    Discharge Medications   Current Discharge Medication List    START taking these medications   Details  sacubitril-valsartan (ENTRESTO) 24-26 MG Take 1 tablet by mouth 2 (two) times daily. Qty: 60 tablet, Refills: 6    sotalol (BETAPACE) 80 MG tablet Take 1 tablet (80 mg total) by mouth every 12 (twelve) hours. Qty: 60 tablet, Refills: 6      CONTINUE these medications which have CHANGED   Details  amiodarone (PACERONE) 200 MG tablet Take 2 tablets (400 mg total) by mouth 2 (two) times daily. 400 mg (2-200mg  tabs) twice a day for one week then beginning 06/10/17 go to 200 mg twice a day Qty: 75 tablet, Refills: 6    potassium chloride SA (KLOR-CON M20) 20 MEQ tablet Take 2 tablets (40 mEq total) by mouth 2 (two) times daily. Qty: 60 tablet, Refills: 6      CONTINUE these medications which have NOT CHANGED   Details  acetaminophen (TYLENOL) 325 MG tablet Take 2 tablets (650 mg total) by mouth every 4 (four) hours as needed for headache or mild pain.      albuterol (PROVENTIL HFA;VENTOLIN HFA) 108 (90 Base) MCG/ACT inhaler Inhale 1 puff into the lungs every 6 (six) hours as needed for wheezing or shortness of breath. Qty: 1 Inhaler, Refills: 1    aspirin 81 MG chewable tablet Chew 81 mg by mouth daily.    atorvastatin (LIPITOR) 80 MG tablet Take 1 tablet (80 mg total) by mouth daily at 6 PM. Qty: 30 tablet, Refills: 11    FLUoxetine (PROZAC) 20 MG tablet Take 20 mg by mouth daily.    rivaroxaban (XARELTO) 20 MG TABS tablet Take 1 tablet (20 mg total) by mouth daily with supper. Qty: 30 tablet, Refills: 0    spironolactone (ALDACTONE) 25 MG tablet Take 1 tablet (25 mg total) by mouth daily. Qty: 30 tablet, Refills: 0    torsemide (DEMADEX) 20 MG tablet Take 3 tablets (60 mg total) by mouth 2 (two) times daily. Qty: 180 tablet, Refills: 5      STOP taking these medications     sacubitril-valsartan (ENTRESTO) 49-51 MG      pantoprazole (PROTONIX) 40 MG tablet            Outstanding Labs/Studies   BMP on visit on the 21st   Duration of Discharge Encounter   Greater than 30 minutes including physician time.  Signed, Nada Boozer NP 06/02/2017, 12:22 PM Pt seen and examined Reviewed with pt and family issues of multiple shocks to terminate VT/VF and options of decreasing DFT or changing vectors. Will continue wi sotalol Have discussed the importance of abstinence and work issues

## 2017-06-02 NOTE — Plan of Care (Addendum)
Problem: Physical Regulation: Goal: Ability to maintain clinical measurements within normal limits will improve Outcome: Progressing Vital signs stable.  Remains AV paced. Denies chest pain and SOB.  BP has been running low.  Night time Entresto held per MD.

## 2017-06-02 NOTE — Discharge Instructions (Signed)
Weigh daily and call for weight increase of 3 lbs in a day or 5 lbs in a week Heart Healthy 2000 mg sodium diet  Only 2 L of liquid per day   No driving for 6 months.  Stop alcohol.   New dose of amiodarone for 1 week, new medication  Call for any lightheadedness or dizziness.    Ventricular Tachycardia Ventricular tachycardia is a fast heartbeat that begins in the lower chambers of the heart (ventricles). It is a type of abnormal heart rhythm (arrhythmia). A normal heartbeat usually starts when an area in the heart called the sinoatrial (SA) node releases an electrical signal. With ventricular tachycardia, electrical signals in the lower part of the heart fire abnormally and interfere with the electrical signals sent out by the SA node. A normal heart rate is 60-100 beats per minute. During an episode of ventricular tachycardia, the heart reaches 100 beats per minute or higher. This condition can be life-threatening and should be treated immediately. What are the causes? This condition is caused by abnormal electrical activity in the lower part of the heart. This may result from:  Medicines.f  Diseases of the heart muscle (cardiomyopathy).  The heart not getting enough oxygen. This may be caused by blood flow problems in the arteries.  An inflammatory disease that affects multiple areas of the body (sarcoidosis).  Drug use, such as cocaine, amphetamine, or anabolic steroid use.  What increases the risk? You are more likely to develop this condition if:  You have had a heart attack.  You have: ? Heart failure or cardiomyopathy. ? Heart defects that you were born with (congenital heart defects). ? Abnormal heart tissue. ? Heart valves that leak or are narrow. ? Diabetes. ? An infection that affects the heart. ? High blood pressure. ? An overactive or underactive thyroid. ? Sleep apnea. ? A family history of stopped heartbeat (cardiac arrest) or coronary artery  disease. ? High cholesterol.  You smoke.  You drink alcohol heavily.  You use drugs, such as cocaine.  What are the signs or symptoms? Symptoms of this condition include:  A pounding heartbeat.  Feeling as if your heart is skipping beats or fluttering (palpitations).  Shortness of breath.  Anxiety.  Dizziness.  Light-headedness.  Fainting.  Chest pain.  Cardiac arrest caused by an irregular heartbeat (arrhythmia).  How is this diagnosed? This condition may be diagnosed based on:  Your symptoms and medical history.  A physical exam.  Electrocardiogram (ECG). This test is done to check for problems with electrical activity in the heart.  Holter monitor or event monitor test. This test involves wearing a portable device that monitors your heart rate over time.  You may also have other tests, including:  Blood tests.  Chest X-ray.  Echocardiogram. This test involves using sound waves to create images of the heart.  Angiogram. During this test, dye is injected into your bloodstream, and then X-rays are taken. The dye lets your health care provider see how blood flows through your arteries.  Exercise stress test. During this test, an ECG is done while you exercise on a treadmill.  Cardiac CT scan or cardiac MRI.  How is this treated? Treatment for this condition depends on the cause. Treatment may include:  Medicines that slow the heart rate and return it to a normal rhythm (anti-arrhythmics).  An electric shock (cardioversion) that makes the heart go back to a normal rhythm.  An electrophysiology study. This procedure can help locate areas  of heart tissue that are causing rapid heartbeats. ? In this procedure, a thin, flexible tube (catheter) is inserted into one of your veins and moved to your heart to evaluate your heart's electrical activity. ? In some cases, the heart tissue that is causing problems may be killed with radiofrequency energy delivered  through the catheter (radiofrequency ablation). This may help your heart keep a normal rhythm.  An implantable cardioverter defibrillator (ICD). This is a small device that monitors your heartbeat. When it senses an irregular heartbeat, it sends a shock to bring the heartbeat back to normal. The ICD is implanted under the skin in the chest.  Surgery to improve blood flow to the heart.  Genetic counseling to check whether your family members are at risk for ventricular tachycardia.  Follow these instructions at home: Lifestyle  Do not use any products that contain nicotine or tobacco, such as cigarettes and e-cigarettes. If you need help quitting, ask your health care provider.  Do not use stimulant drugs, such as cocaine or methamphetamines.  Maintain a healthy weight.  Manage stress. Try to do this with relaxation exercises, yoga, quiet time, or meditation. Eating and drinking  Eat a healthy diet. This includes plenty of fruits and vegetables, whole grains, lean meats, and low-fat or fat-free dairy products.  Avoid eating foods that are high in saturated fat, trans fat, sugar, or salt (sodium).  Ask your health care provider if you may drink alcohol. ? If alcohol triggers episodes of ventricular tachycardia, do not drink alcohol. ? If alcohol does not trigger episodes, limit alcohol intake to no more than 1 drink a day for nonpregnant women and 2 drinks a day for men. One drink equals 12 oz of beer, 5 oz of wine, or 1 oz of hard liquor. General instructions  Take over-the-counter and prescription medicines only as told by your health care provider.  Exercise regularly. Aim for 150 minutes of moderate exercise or 75 minutes of vigorous exercise per week. Ask your health care provider what exercises are safe for you.  Keep all follow-up visits as told by your health care provider. This is important. Contact a health care provider if:  Your symptoms get worse.  You develop new  symptoms, such as new palpitations.  You feel depressed. Get help right away if:  You have an episode of ventricular tachycardia that lasts 30 seconds or more.  You have chest pain.  You have trouble breathing. These symptoms may represent a serious problem that is an emergency. Do not wait to see if the symptoms will go away. Get medical help right away. Call your local emergency services (911 in the [place] Do not drive yourself to the hospital. Summary  Ventricular tachycardia is a fast heartbeat that begins in the lower chambers of the heart. This condition can be life-threatening and should be treated immediately.  This condition may be treated with medicines, electric shock, radiofrequency energy, surgery, or insertion of an implantable cardioverter defibrillator (ICD).  Get help right away if you have chest pain, trouble breathing, or ventricular tachycardia symptoms that last more than 30 seconds. This information is not intended to replace advice given to you by your health care provider. Make sure you discuss any questions you have with your health care provider. Document Released: 10/26/2016 Document Revised: 10/26/2016 Document Reviewed: 10/26/2016 Elsevier Interactive Patient Education  2018 [employer].

## 2017-06-06 ENCOUNTER — Other Ambulatory Visit: Payer: Self-pay | Admitting: Cardiology

## 2017-06-07 ENCOUNTER — Other Ambulatory Visit (HOSPITAL_COMMUNITY): Payer: Self-pay

## 2017-06-07 NOTE — Progress Notes (Signed)
Paramedicine Encounter    Patient ID: [name] [name], male    DOB: 06-19-1961, 56 y.o.   MRN: [id number]   Patient Care Team: [name], MD as PCP - General (Cardiology) [name], MD (Family Medicine)  Patient Active Problem List   Diagnosis Date Noted  . VT (ventricular tachycardia) (HCC) 05/30/2017  . AKI (acute kidney injury) (HCC)   . Acute on chronic systolic CHF (congestive heart failure) (HCC) 01/29/2017  . HTN (hypertension) 12/25/2016  . Obesity 12/25/2016  . OSA (obstructive sleep apnea) 12/25/2016  . Hyperglycemia 12/25/2016  . Acute respiratory failure with hypoxia (HCC) 12/25/2016  . Chronic anticoagulation 06/27/2016  . Encounter for therapeutic drug monitoring 04/19/2015  . S/P ICD (internal cardiac defibrillator) procedure Medtronic CRT ICD 04/15/2015  . Atrial fibrillation with RVR - Paroxysmal 04/08/2015  . NICM- EF 25% 2012 & 20-25% 2016 04/06/2015  . Troponin level elevated 04/06/2015  . Rectal bleeding 10/26/2011  . Hydrocele, left 05/24/2011  . Tobacco use disorder- 1/2 pk a day   . H/O ETOH abuse 6-8 beers a day   . Obesity 09/27/2008  . CAD- minor CAD 2009 and 2016 09/27/2008  . VENTRICULAR TACHYCARDIA 09/27/2008  . Essential hypertension 06/19/2008  . Chronic systolic heart failure (HCC) 06/19/2008    Current Outpatient Prescriptions:  .  acetaminophen (TYLENOL) 325 MG tablet, Take 2 tablets (650 mg total) by mouth every 4 (four) hours as needed for headache or mild pain., Disp: , Rfl:  .  albuterol (PROVENTIL HFA;VENTOLIN HFA) 108 (90 Base) MCG/ACT inhaler, Inhale 1 puff into the lungs every 6 (six) hours as needed for wheezing or shortness of breath., Disp: 1 Inhaler, Rfl: 1 .  amiodarone (PACERONE) 200 MG tablet, Take 2 tablets (400 mg total) by mouth 2 (two) times daily. 400 mg (2-200mg  tabs) twice a day for one week then beginning 06/10/17 go to 200 mg twice a day, Disp: 75 tablet, Rfl: 6 .  aspirin 81 MG chewable tablet, Chew 81 mg  by mouth daily., Disp: , Rfl:  .  atorvastatin (LIPITOR) 80 MG tablet, Take 1 tablet (80 mg total) by mouth daily at 6 PM., Disp: 30 tablet, Rfl: 11 .  FLUoxetine (PROZAC) 20 MG tablet, Take 20 mg by mouth daily., Disp: , Rfl:  .  potassium chloride SA (KLOR-CON M20) 20 MEQ tablet, Take 2 tablets (40 mEq total) by mouth 2 (two) times daily., Disp: 60 tablet, Rfl: 6 .  rivaroxaban (XARELTO) 20 MG TABS tablet, Take 1 tablet (20 mg total) by mouth daily with supper., Disp: 30 tablet, Rfl: 0 .  sacubitril-valsartan (ENTRESTO) 24-26 MG, Take 1 tablet by mouth 2 (two) times daily., Disp: 60 tablet, Rfl: 6 .  sotalol (BETAPACE) 80 MG tablet, Take 1 tablet (80 mg total) by mouth every 12 (twelve) hours., Disp: 60 tablet, Rfl: 6 .  spironolactone (ALDACTONE) 25 MG tablet, Take 1 tablet (25 mg total) by mouth daily., Disp: 30 tablet, Rfl: 0 .  torsemide (DEMADEX) 20 MG tablet, Take 3 tablets (60 mg total) by mouth 2 (two) times daily., Disp: 180 tablet, Rfl: 5 .  XARELTO 20 MG TABS tablet, TAKE ONE TABLET BY MOUTH ONCE DAILY WITH  SUPPER, Disp: 90 tablet, Rfl: 0 No Known Allergies   Social History   Social History  . Marital status: Divorced    Spouse name: N/A  . Number of children: 2  . Years of education: N/A   Occupational History  . scrap metal   . disabled, odd  jobs    Social History Main Topics  . Smoking status: Former Smoker    Packs/day: 0.50    Years: 4.00    Types: Cigarettes  . Smokeless tobacco: Never Used     Comment: about a pack a week. social  . Alcohol use 25.2 oz/week    42 Cans of beer per week     Comment: 6 beers per day  . Drug use: Yes    Frequency: 7.0 times per week    Types: Codeine, Marijuana     Comment: has discontinued  . Sexual activity: Yes    Partners: Female   Other Topics Concern  . Not on file   Social History Narrative   ** Merged History Encounter **        Physical Exam  Constitutional: He is oriented to person, place, and time.   Cardiovascular: Normal rate and regular rhythm.   Pulmonary/Chest: Effort normal and breath sounds normal. No respiratory distress. He has no wheezes. He has no rales.  Abdominal: Soft.  Musculoskeletal: Normal range of motion. He exhibits no edema.  Neurological: He is alert and oriented to person, place, and time.  Skin: Skin is warm and dry.  Psychiatric: He exhibits a depressed mood.  [name] [name] has been aloof the past few times we have seen each other and has cried on a few occasions and said he "didn't know what he was doing here anymore." He does not seem to have SI but a follow up has been requested from the LCSW in the clinic.         Future Appointments Date Time Provider Department Center  06/08/2017 1:00 PM Sheilah Pigeon, PA-C [facility]  06/11/2017 9:00 AM [facility] PA/NP [facility] None  07/02/2017 9:40 AM [facility] DEVICE REMOTES [facility]  07/10/2017 10:00 AM [facility] ECHO 1-BUZZ [facility]  07/10/2017 11:00 AM [name], MD [facility] None   BP 90/60 (BP Location: Left Arm, Patient Position: Sitting, Cuff Size: Large)   Pulse 60   Resp 16   Wt 300 lb (136.1 kg)   SpO2 96%   BMI 36.52 kg/m  Weight yesterday- 299 lbs Last visit weight- 295 lbs  [name] [name] was seen at home today and reports feeling generally well. He denied SOB, dizziness, headache or orthopnea. He has been compliant with all his medications. While talking with him today I asked about his recent mood. He stated that some days he feels down and doesn't know "what he is doing here anymore." He became tearful at that point. I asked if he has spoken to a counselor in the past and he said he had for anger management and he was willing to go back. I contacted Annice Pih at the clinic and let her know what was happening. She said she was going to reach out to [name] [name] and see what help she could provide.   Time spent with patient: 63 minutes  Jacqualine Code,  EMT 06/07/17  ACTION: Home visit completed Next visit planned for 1 week

## 2017-06-07 NOTE — Progress Notes (Signed)
Cardiology Office Note Date:  06/08/2017  Patient ID:  [name] [name], DOB 04-Jun-1961, MRN [id number] PCP:  [name], MD  Cardiologist:  Dr. Antoine Poche CHF: Dr. [name] Electrophysiologist: Dr. Graciela Husbands   Chief Complaint: post hospital  History of Present Illness: [name] [name] is a 56 y.o. male with history of chronic systolic CHF (last EF 15% in April 2018), dilated NICM s/p bi V ICD, PAF on Xarelto, ETOH abuse, tobacco abuse, OSA on CPAP, and HTN.  He comes in today to be seen for Dr. Graciela Husbands.  Most recently he was admitted to [facility] 05/30/17 for VT/ICD shocks. He reported having a usual day, nothing unusual and reports compliance with all of his medicines and specifically he says this regime has kept his fluid levels good and has kept up with them, happy with the current regime.  He went to bed last night as usual, woke about 10PM feeling SOB, became lightheaded seated at the edge of his bed then diaphoretic, leaned over to steady himself on the wall/window across from him and got shocked, then again, leaned back and the next thing he knew was waking up.  He was aware of 1 or 2 shocks, not all 4.  He was started on sotalol in effort to reduce his DFTs.  Dr. Graciela Husbands noted discussion regarding ETOH, also mentioned COPD and anatomically heart discplaced posteriorly with increased air between device and heart, because of this, discussed with the patient adding new RV lead with SVC coil (rather then coil > can), and repeating DFT testing on sotalol, the patient preferred to proceed with ETOH cessation and sotalol without further for now.  He feels pretty well.  Denies any change in his exertional capacity, feels like he can do the things he wants to.  No CP, palpitations, no rest SOB, reports sleeping well without symptoms of PND or orthopnea.  No further ICD shocks.  He reports reduced ETOH intake.  He has been out on his rider mowing lawns and feels well.  He denies any bleeding or signs of  bleeding.  Device information MDT CRT-D, implanted 04/14/15, Dr. Graciela Husbands + known PMVT hx (and AF) AAD amiodarone 05/30/17 had VT failed 3 ATPs, third accelerated VT to VF, 3 failed shocks at max output 4th restored SR   Past Medical History:  Diagnosis Date  . CAD (coronary artery disease)    Mild nonobstructive (Cath 09)  . Chronic systolic heart failure (HCC)    Nonischemic CM: echo 4/12 with EF 25-30%, grade 2 diast dysfxn, mild dilated aortic root 43 mm, trivial MR, mod LAE  . COPD (chronic obstructive pulmonary disease) (HCC)   . Dyspnea   . H/O ETOH abuse   . HTN (hypertension)   . Hypersomnia   . Obesity, morbid (HCC)   . OSA on CPAP   . Systolic and diastolic CHF, acute on chronic (HCC) 11/2016  . Tobacco use disorder   . Ventricular tachycardia [facility])     Past Surgical History:  Procedure Laterality Date  . CARDIAC CATHETERIZATION N/A 04/08/2015   Procedure: Right/Left Heart Cath and Coronary Angiography;  Surgeon: Corky Crafts, MD;  Location: [facility] INVASIVE CV LAB;  Service: Cardiovascular;  Laterality: N/A;  . EP IMPLANTABLE DEVICE N/A 04/14/2015   Procedure: BiV ICD Insertion CRT-D;  Surgeon: [name], MD;  Location: [facility] INVASIVE CV LAB;  Service: Cardiovascular;  Laterality: N/A;  . HYDROCELE EXCISION / REPAIR    . TUMOR EXCISION Left   . 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 VASECTOMY      Current Outpatient Prescriptions  Medication Sig Dispense Refill  . acetaminophen (TYLENOL) 325 MG tablet Take 2 tablets (650 mg total) by mouth every 4 (four) hours as needed for headache or mild pain.    Marland Kitchen albuterol (PROVENTIL HFA;VENTOLIN HFA) 108 (90 Base) MCG/ACT inhaler Inhale 1 puff into the lungs every 6 (six) hours as needed for wheezing or shortness of breath. 1 Inhaler 1  . amiodarone (PACERONE) 200 MG tablet Take 2 tablets (400 mg total) by mouth 2 (two) times daily. 400 mg (2-200mg  tabs) twice a day for one week then beginning 06/10/17  go to 200 mg twice a day 75 tablet 6  . aspirin 81 MG chewable tablet Chew 81 mg by mouth daily.    Marland Kitchen atorvastatin (LIPITOR) 80 MG tablet Take 1 tablet (80 mg total) by mouth daily at 6 PM. 30 tablet 11  . FLUoxetine (PROZAC) 20 MG tablet Take 20 mg by mouth daily.    . potassium chloride SA (KLOR-CON M20) 20 MEQ tablet Take 2 tablets (40 mEq total) by mouth 2 (two) times daily. 60 tablet 6  . rivaroxaban (XARELTO) 20 MG TABS tablet Take 1 tablet (20 mg total) by mouth daily with supper. 30 tablet 0  . sacubitril-valsartan (ENTRESTO) 24-26 MG Take 1 tablet by mouth 2 (two) times daily. 60 tablet 6  . sotalol (BETAPACE) 80 MG tablet Take 1 tablet (80 mg total) by mouth every 12 (twelve) hours. 60 tablet 6  . spironolactone (ALDACTONE) 25 MG tablet Take 1 tablet (25 mg total) by mouth daily. 30 tablet 0  . torsemide (DEMADEX) 20 MG tablet Take 3 tablets (60 mg total) by mouth 2 (two) times daily. 180 tablet 5   No current facility-administered medications for this visit.     Allergies:   Patient has no known allergies.   Social History:  The patient  reports that he has quit smoking. His smoking use included Cigarettes. He has a 2.00 pack-year smoking history. He has never used smokeless tobacco. He reports that he drinks about 25.2 oz of alcohol per week . He reports that he uses drugs, including Codeine and Marijuana, about 7 times per week.   Family History:  The patient's family history includes Congestive Heart Failure in his father; Coronary artery disease in his unknown relative; Healthy in his brother, daughter, father, sister, sister, son, and son; Heart Problems in his mother; Other in his mother.  ROS:  Please see the history of present illness.  All other systems are reviewed and otherwise negative.   PHYSICAL EXAM:  VS:  BP (!) 100/50   Pulse 66   Ht  (1.93 m)   Wt (!) 306 lb (138.8 kg)   SpO2 95%   BMI 37.25 kg/m  BMI: Body mass index is 37.25 kg/m. Well nourished,  well developed, in no acute distress  HEENT: normocephalic, atraumatic  Neck: no JVD, carotid bruits or masses Cardiac:  RRR; no significant murmurs, no rubs, or gallops Lungs:  CTA b/l, no wheezing, rhonchi or rales  Abd: soft, nontender MS: no deformity or atrophy Ext: no edema  Skin: warm and dry, no rash Neuro:  No gross deficits appreciated Psych: euthymic mood, full affect  ICD site is stable, no tethering or discomfort   EKG:  Done today and reviewed by myself shows SR/Vpaced, QTc is 514 with paced QRS duration ICD interrogation done today and reviewed by  myself: battery and lead measurements are good, no VT.  Optivol is up, appears since 06/03/17, 99.7%BiVe pacing  12/26/16: TTE Study Conclusions - Left ventricle: The cavity size was moderately dilated. Systolic function was severely reduced. The estimated ejection fraction was 15%. Diffuse hypokinesis. Doppler parameters are consistent with restrictive physiology, indicative of decreased left ventricular diastolic compliance and/or increased left atrial pressure. - Aortic root: The aortic root was mildly dilated. - Mitral valve: Calcified annulus. There was mild regurgitation. - Left atrium: The atrium was severely dilated. - Pericardium, extracardiac: A trivial pericardial effusion was identified. Impressions: - Technically difficult; severe global reduction in LV systolic function; restrictive filling; mildly dilated aortic root; mild MR; severe LAE  04/08/15: LHC  Nonobstructive coronary artery disease.  Elevated LVEDP  Moderate pulmonary artery hypertension.  Cardiac output 5.5 L/min; CI 2.1.  Recent Labs: 12/25/2016: TSH 1.150 01/29/2017: B Natriuretic Peptide 557.1 01/31/2017: ALT 27 05/30/2017: Hemoglobin 11.0; Platelets 170 06/02/2017: BUN 14; Creatinine, Ser 1.04; Magnesium 2.2; Potassium 3.7; Sodium 138  No results found for requested labs within last 8760 hours.   Estimated  Creatinine Clearance: 123.6 mL/min (by C-G formula based on SCr of 1.04 mg/dL).   Wt Readings from Last 3 Encounters:  06/08/17 (!) 306 lb (138.8 kg)  06/07/17 300 lb (136.1 kg)  06/02/17 291 lb 0.1 oz (132 kg)     Other studies reviewed: Additional studies/records reviewed today include: summarized above  ASSESSMENT AND PLAN:  1. VT     None further, he will be back to once day amiodarone 06/10/17     Tolerating betapace      PVCs, though less then before  He is reminded no driving for 6 months  2. NICM     Weight is up, Optivol suggests fluid OL, exam is not overtly overloaded     Home weight prior to hospital was 295, since home 297-300, was 300 this AM at home   Take aldactone BID for 2 days He reports increased fluid intake and is cautioned about this as well as sodium reduction He sees CHF team Monday  3. ICD     Intact function, no changes made  4. Paroxysmal AFib    CHA2DS2Vasc is at least 2, on xarelto  5. HTN     Relative hypotension, follow  6. ETOH     He has reduced his intake, counseled   Disposition: F/u with CHF team Monday, BMET and mag today, q 3 months remotes, Dr. Graciela Husbands in 3 months, sooner if needed  Current medicines are reviewed at length with the patient today.  The patient did not have any concerns regarding medicines.  Norma Fredrickson, PA-C 06/08/2017 1:34 PM     [facility] [address] [place] [address] [phone] (office)  [phone] (fax)

## 2017-06-08 ENCOUNTER — Encounter: Payer: Self-pay | Admitting: Physician Assistant

## 2017-06-08 ENCOUNTER — Ambulatory Visit (INDEPENDENT_AMBULATORY_CARE_PROVIDER_SITE_OTHER): Payer: Medicaid Other | Admitting: Physician Assistant

## 2017-06-08 VITALS — BP 100/50 | HR 66 | Ht 76.0 in | Wt 306.0 lb

## 2017-06-08 DIAGNOSIS — I472 Ventricular tachycardia, unspecified: Secondary | ICD-10-CM

## 2017-06-08 DIAGNOSIS — I4891 Unspecified atrial fibrillation: Secondary | ICD-10-CM | POA: Diagnosis not present

## 2017-06-08 DIAGNOSIS — I5022 Chronic systolic (congestive) heart failure: Secondary | ICD-10-CM | POA: Diagnosis not present

## 2017-06-08 DIAGNOSIS — Z95810 Presence of automatic (implantable) cardiac defibrillator: Secondary | ICD-10-CM | POA: Diagnosis not present

## 2017-06-08 DIAGNOSIS — I428 Other cardiomyopathies: Secondary | ICD-10-CM | POA: Diagnosis not present

## 2017-06-08 NOTE — Patient Instructions (Addendum)
Medication Instructions: Your physician has recommended you make the following change in your medication:  -1) For the next 2 days take Aldactone (Spironolactone) 1 tablet (25 mg) by mouth twice daily. Then resume 1 tablet by mouth daily  Labwork: Your physician recommends that you have lab work today: BMET and Magnesium Level  Procedures/Testing: None Ordered  Follow-Up: Your physician recommends that you schedule a follow-up appointment in: 3 MONTHS with Dr. Graciela Husbands.    Remote monitoring is used to monitor your  ICD from home. This monitoring reduces the number of office visits required to check your device to one time per year. It allows [place] to keep an eye on the functioning of your device to ensure it is working properly. You are scheduled for a device check from home on 07/02/17. You may send your transmission at any time that day. If you have a wireless device, the transmission will be sent automatically. After your physician reviews your transmission, you will receive a postcard with your next transmission date.    If you need a refill on your cardiac medications before your next appointment, please call your pharmacy.

## 2017-06-09 LAB — BASIC METABOLIC PANEL
BUN / CREAT RATIO: 14 (ref 9–20)
BUN: 15 mg/dL (ref 6–24)
CO2: 26 mmol/L (ref 20–29)
CREATININE: 1.09 mg/dL (ref 0.76–1.27)
Calcium: 9.1 mg/dL (ref 8.7–10.2)
Chloride: 101 mmol/L (ref 96–106)
GFR calc Af Amer: 88 mL/min/{1.73_m2} (ref >59)
GFR calc non Af Amer: 77 mL/min/{1.73_m2} (ref >59)
GLUCOSE: 104 mg/dL — AB (ref 65–99)
POTASSIUM: 4.4 mmol/L (ref 3.5–5.2)
SODIUM: 140 mmol/L (ref 134–144)

## 2017-06-09 LAB — MAGNESIUM: Magnesium: 2.3 mg/dL (ref 1.6–2.3)

## 2017-06-11 ENCOUNTER — Ambulatory Visit (HOSPITAL_COMMUNITY)
Admit: 2017-06-11 | Discharge: 2017-06-11 | Disposition: A | Discharge status: Home / Self Care | Payer: Medicaid Other | Source: Ambulatory Visit | Attending: Cardiology | Admitting: Cardiology

## 2017-06-11 ENCOUNTER — Encounter (HOSPITAL_COMMUNITY): Payer: Self-pay

## 2017-06-11 VITALS — BP 112/62 | HR 60 | Wt 300.2 lb

## 2017-06-11 DIAGNOSIS — I2510 Atherosclerotic heart disease of native coronary artery without angina pectoris: Secondary | ICD-10-CM | POA: Diagnosis not present

## 2017-06-11 DIAGNOSIS — I472 Ventricular tachycardia, unspecified: Secondary | ICD-10-CM

## 2017-06-11 DIAGNOSIS — I480 Paroxysmal atrial fibrillation: Secondary | ICD-10-CM | POA: Insufficient documentation

## 2017-06-11 DIAGNOSIS — Z6836 Body mass index (BMI) 36.0-36.9, adult: Secondary | ICD-10-CM | POA: Insufficient documentation

## 2017-06-11 DIAGNOSIS — I5022 Chronic systolic (congestive) heart failure: Secondary | ICD-10-CM

## 2017-06-11 DIAGNOSIS — I4891 Unspecified atrial fibrillation: Secondary | ICD-10-CM

## 2017-06-11 DIAGNOSIS — I5042 Chronic combined systolic (congestive) and diastolic (congestive) heart failure: Secondary | ICD-10-CM | POA: Diagnosis not present

## 2017-06-11 DIAGNOSIS — I110 Hypertensive heart disease with heart failure: Secondary | ICD-10-CM | POA: Insufficient documentation

## 2017-06-11 DIAGNOSIS — J449 Chronic obstructive pulmonary disease, unspecified: Secondary | ICD-10-CM | POA: Diagnosis not present

## 2017-06-11 DIAGNOSIS — F1010 Alcohol abuse, uncomplicated: Secondary | ICD-10-CM | POA: Diagnosis not present

## 2017-06-11 DIAGNOSIS — Z79899 Other long term (current) drug therapy: Secondary | ICD-10-CM | POA: Diagnosis not present

## 2017-06-11 DIAGNOSIS — F1210 Cannabis abuse, uncomplicated: Secondary | ICD-10-CM | POA: Diagnosis not present

## 2017-06-11 DIAGNOSIS — Z7901 Long term (current) use of anticoagulants: Secondary | ICD-10-CM

## 2017-06-11 DIAGNOSIS — Z95810 Presence of automatic (implantable) cardiac defibrillator: Secondary | ICD-10-CM | POA: Diagnosis not present

## 2017-06-11 DIAGNOSIS — I428 Other cardiomyopathies: Secondary | ICD-10-CM | POA: Diagnosis not present

## 2017-06-11 DIAGNOSIS — Z7982 Long term (current) use of aspirin: Secondary | ICD-10-CM | POA: Diagnosis not present

## 2017-06-11 DIAGNOSIS — G4733 Obstructive sleep apnea (adult) (pediatric): Secondary | ICD-10-CM | POA: Diagnosis not present

## 2017-06-11 DIAGNOSIS — Z87891 Personal history of nicotine dependence: Secondary | ICD-10-CM | POA: Insufficient documentation

## 2017-06-11 DIAGNOSIS — Z8249 Family history of ischemic heart disease and other diseases of the circulatory system: Secondary | ICD-10-CM | POA: Diagnosis not present

## 2017-06-11 LAB — BASIC METABOLIC PANEL
ANION GAP: 5 (ref 5–15)
BUN: 10 mg/dL (ref 6–20)
CALCIUM: 8.5 mg/dL — AB (ref 8.9–10.3)
CO2: 29 mmol/L (ref 22–32)
Chloride: 103 mmol/L (ref 101–111)
Creatinine, Ser: 1.02 mg/dL (ref 0.61–1.24)
GLUCOSE: 117 mg/dL — AB (ref 65–99)
POTASSIUM: 3.8 mmol/L (ref 3.5–5.1)
SODIUM: 137 mmol/L (ref 135–145)

## 2017-06-11 NOTE — Patient Instructions (Addendum)
Routine lab work today. Will notify you of abnormal results, otherwise no news is good news!  Follow up as scheduled with Dr. Shirlee Latch.  Take all medication as prescribed the day of your appointment. Bring all medications with you to your appointment.  Do the following things EVERYDAY: 1) Weigh yourself in the morning before breakfast. Write it down and keep it in a log. 2) Take your medicines as prescribed 3) Eat low salt foods-Limit salt (sodium) to 2000 mg per day.  4) Stay as active as you can everyday 5) Limit all fluids for the day to less than 2 liters

## 2017-06-11 NOTE — Progress Notes (Signed)
Advanced Heart Failure Clinic Note   Referring Physician: Dr. Cyndie Chime Primary Care: Dr. Windle Guard, MD Primary Cardiologist: Dr. Shona Simpson. [name] [name]  HPI: [name] is a 56 year old male with a past medical history of chronic systolic CHF (last EF 15% in April 2018), dilated NICM s/p bi V ICD, PAF with intermittent compliance with Xarelto, ETOH abuse, tobacco abuse, OSA on CPAP, and HTN.   Admitted 12/25/16-12/27/16 with acute on chronic systolic CHF, volume overload. He was diuresed 11 pounds on IV lasix.  Discharge weight was 294 pounds. It was felt that the etiology of his NICM was ETOH vs hypertension related.   Admitted 5/14-5/16 with volume overload, acute respiratory failure requiring Bipap. He was not taking his Entresto as he was confused about dosing and there was a mix up his pharmacy. He was diuresed with IV lasix, discharge weight was 295 pounds.   Admitted 05/30/17-06/02/17 with ICD shock x 4. Started on IV Amiodarone, as well as sotalol.   Returns today for HF follow up. He denies SOB, chest pain, palpitations. Weights at home 295-297 pounds. Eating some high salt foods, drinking more than 2L a day some days. Drinking 6 beers a week, continues to smoke marijuana daily. Taking all medications. No SOB with walking around stores, sometimes SOB with inclines or hills.   OPTIVOL: Fluid increasing, above threshold. No VT/VF in the past week.   Past Medical History:  Diagnosis Date  . CAD (coronary artery disease)    Mild nonobstructive (Cath 09)  . Chronic systolic heart failure (HCC)    Nonischemic CM: echo 4/12 with EF 25-30%, grade 2 diast dysfxn, mild dilated aortic root 43 mm, trivial MR, mod LAE  . COPD (chronic obstructive pulmonary disease) (HCC)   . Dyspnea   . H/O ETOH abuse   . HTN (hypertension)   . Hypersomnia   . Obesity, morbid (HCC)   . OSA on CPAP   . Systolic and diastolic CHF, acute on chronic (HCC) 11/2016  . Tobacco use disorder   . Ventricular  tachycardia [facility])     Current Outpatient Prescriptions  Medication Sig Dispense Refill  . acetaminophen (TYLENOL) 325 MG tablet Take 2 tablets (650 mg total) by mouth every 4 (four) hours as needed for headache or mild pain.    Marland Kitchen albuterol (PROVENTIL HFA;VENTOLIN HFA) 108 (90 Base) MCG/ACT inhaler Inhale 1 puff into the lungs every 6 (six) hours as needed for wheezing or shortness of breath. 1 Inhaler 1  . amiodarone (PACERONE) 200 MG tablet Take 2 tablets (400 mg total) by mouth 2 (two) times daily. 400 mg (2-200mg  tabs) twice a day for one week then beginning 06/10/17 go to 200 mg twice a day 75 tablet 6  . aspirin 81 MG chewable tablet Chew 81 mg by mouth daily.    Marland Kitchen atorvastatin (LIPITOR) 80 MG tablet Take 1 tablet (80 mg total) by mouth daily at 6 PM. 30 tablet 11  . FLUoxetine (PROZAC) 20 MG tablet Take 20 mg by mouth daily.    . potassium chloride SA (KLOR-CON M20) 20 MEQ tablet Take 2 tablets (40 mEq total) by mouth 2 (two) times daily. 60 tablet 6  . rivaroxaban (XARELTO) 20 MG TABS tablet Take 1 tablet (20 mg total) by mouth daily with supper. 30 tablet 0  . sacubitril-valsartan (ENTRESTO) 24-26 MG Take 1 tablet by mouth 2 (two) times daily. 60 tablet 6  . sotalol (BETAPACE) 80 MG tablet Take 1 tablet (80 mg total) by mouth every  12 (twelve) hours. 60 tablet 6  . spironolactone (ALDACTONE) 25 MG tablet Take 1 tablet (25 mg total) by mouth daily. 30 tablet 0  . torsemide (DEMADEX) 20 MG tablet Take 3 tablets (60 mg total) by mouth 2 (two) times daily. 180 tablet 5   No current facility-administered medications for this encounter.     No Known Allergies    Social History   Social History  . Marital status: Divorced    Spouse name: N/A  . Number of children: 2  . Years of education: N/A   Occupational History  . scrap metal   . disabled, odd jobs    Social History Main Topics  . Smoking status: Former Smoker    Packs/day: 0.50    Years: 4.00    Types: Cigarettes  .  Smokeless tobacco: Never Used     Comment: about a pack a week. social  . Alcohol use 25.2 oz/week    42 Cans of beer per week     Comment: 6 beers per day  . Drug use: Yes    Frequency: 7.0 times per week    Types: Codeine, Marijuana     Comment: has discontinued  . Sexual activity: Yes    Partners: Female   Other Topics Concern  . Not on file   Social History Narrative   ** Merged History Encounter **          Family History  Problem Relation Age of Onset  . Other Mother        cardiac surgery. late 1990s  . Heart Problems Mother        CABG AGE 67  . Congestive Heart Failure Father   . Healthy Father        AGE 39  . Coronary artery disease Unknown   . Healthy Brother        AGE 18  . Healthy Sister        AGE 70  . Healthy Sister        AGE 49  . Healthy Son   . Healthy Son   . Healthy Daughter     Vitals:   06/11/17 0914  BP: 112/62  Pulse: 60  SpO2: 96%  Weight: (!) 300 lb 3.2 oz (136.2 kg)   Wt Readings from Last 3 Encounters:  06/11/17 (!) 300 lb 3.2 oz (136.2 kg)  06/08/17 (!) 306 lb (138.8 kg)  06/07/17 300 lb (136.1 kg)     PHYSICAL EXAM: General: Well appearing. No resp difficulty. HEENT: Normal Neck: Supple. JVP to jaw. Carotids 2+ bilat; no bruits. No thyromegaly or nodule noted. Cor: PMI nondisplaced. RRR, No M/G/R noted Lungs: CTAB, normal effort. Abdomen: Soft, non-tender, non-distended, no HSM. No bruits or masses. +BS  Extremities: No cyanosis, clubbing, rash, R and LLE no edema.  Neuro: Alert & orientedx3, cranial nerves grossly intact. moves all 4 extremities w/o difficulty. Affect pleasant    EKG: AV paced, Qtc .538   ASSESSMENT & PLAN: 1. Chronic combined systolic and diastolic CHF: NICM, Echo 12/2016 EF 15%. Normal cors in July 2016. ETOH vs. Hypertension - NYHA II - Volume elevated on exam. Increase torsemide to 80 mg BID for 3 days.  - Continue KCl BID - Continue Entresto 24/26mg  BID. Recently decreased with  sotalol initiation. Will hold off on increasing until next appt.  - Continue Spiro 25 mg daily - Intolerant to Coreg in the past with hypotension.  - has Bi-V ICD, follows with Dr. Graciela Husbands.  2. PAF - Continue Amio, per EP he needs to be on 200 mg BID starting yesterday. He understands this.  - Reminded him to get yearly eye exams.  - Needs LFT's and TSH next visit.  - Continue Xarelto for anticoagulation.   3. ETOH abuse - Says he has reduced his ETOH use. I suspect this is the main contributor to his cardiomyopathy.   4. Tobacco abuse - Encouraged cessation   5. Substance abuse - Continues to smoke marijuana daily.   6. VT - No further on device interrogation - Continue Amio and sotalol.  - QTc 0.538. This is consistent with his EKG at discharge.   7. OSA - Compliant with CPAP hs.   Continue paramedicine. He has follow up with Dr. Shirlee [name] scheduled for 10/23 with an Echo. BMET today.   [name], NP 06/11/17

## 2017-06-12 ENCOUNTER — Telehealth: Payer: Self-pay | Admitting: Licensed Clinical Social Worker

## 2017-06-12 NOTE — Telephone Encounter (Signed)
CSW attempted to contact patient to offer support as referred by Paramedic [name]. CSW unable to leave message as no voicemail. CSW left message for Paramedic to pass on phone of CSW for return call. Lasandra Beech, LCSW, CCSW-MCS [phone]

## 2017-06-12 NOTE — Addendum Note (Signed)
Addended by: Dareen Piano on: 06/12/2017 09:04 AM   Modules accepted: Orders

## 2017-06-14 ENCOUNTER — Telehealth (HOSPITAL_COMMUNITY): Payer: Self-pay | Admitting: *Deleted

## 2017-06-14 ENCOUNTER — Other Ambulatory Visit (HOSPITAL_COMMUNITY): Payer: Self-pay

## 2017-06-14 NOTE — Telephone Encounter (Signed)
Please call. Cut torsemide back to 60 mg twice a day.   Monitor BP.   Amy Clegg 3:10 PM

## 2017-06-14 NOTE — Telephone Encounter (Signed)
Pt aware and agreeable.  

## 2017-06-14 NOTE — Progress Notes (Signed)
Paramedicine Encounter    Patient ID: [name] [name], male    DOB: Jul 10, 1961, 56 y.o.   MRN: [id number]   Patient Care Team: [name], MD as PCP - General (Cardiology) [name], MD (Family Medicine)  Patient Active Problem List   Diagnosis Date Noted  . VT (ventricular tachycardia) (HCC) 05/30/2017  . AKI (acute kidney injury) (HCC)   . Acute on chronic systolic CHF (congestive heart failure) (HCC) 01/29/2017  . HTN (hypertension) 12/25/2016  . Obesity 12/25/2016  . OSA (obstructive sleep apnea) 12/25/2016  . Hyperglycemia 12/25/2016  . Acute respiratory failure with hypoxia (HCC) 12/25/2016  . Chronic anticoagulation 06/27/2016  . Encounter for therapeutic drug monitoring 04/19/2015  . S/P ICD (internal cardiac defibrillator) procedure Medtronic CRT ICD 04/15/2015  . Atrial fibrillation with RVR - Paroxysmal 04/08/2015  . NICM- EF 25% 2012 & 20-25% 2016 04/06/2015  . Troponin level elevated 04/06/2015  . Rectal bleeding 10/26/2011  . Hydrocele, left 05/24/2011  . Tobacco use disorder- 1/2 pk a day   . H/O ETOH abuse 6-8 beers a day   . Obesity 09/27/2008  . CAD- minor CAD 2009 and 2016 09/27/2008  . VENTRICULAR TACHYCARDIA 09/27/2008  . Essential hypertension 06/19/2008  . Chronic systolic heart failure (HCC) 06/19/2008    Current Outpatient Prescriptions:  .  acetaminophen (TYLENOL) 325 MG tablet, Take 2 tablets (650 mg total) by mouth every 4 (four) hours as needed for headache or mild pain., Disp: , Rfl:  .  albuterol (PROVENTIL HFA;VENTOLIN HFA) 108 (90 Base) MCG/ACT inhaler, Inhale 1 puff into the lungs every 6 (six) hours as needed for wheezing or shortness of breath., Disp: 1 Inhaler, Rfl: 1 .  amiodarone (PACERONE) 200 MG tablet, Take 2 tablets (400 mg total) by mouth 2 (two) times daily. 400 mg (2-200mg  tabs) twice a day for one week then beginning 06/10/17 go to 200 mg twice a day, Disp: 75 tablet, Rfl: 6 .  aspirin 81 MG chewable tablet, Chew 81 mg  by mouth daily., Disp: , Rfl:  .  atorvastatin (LIPITOR) 80 MG tablet, Take 1 tablet (80 mg total) by mouth daily at 6 PM., Disp: 30 tablet, Rfl: 11 .  FLUoxetine (PROZAC) 20 MG tablet, Take 20 mg by mouth daily., Disp: , Rfl:  .  potassium chloride SA (KLOR-CON M20) 20 MEQ tablet, Take 2 tablets (40 mEq total) by mouth 2 (two) times daily., Disp: 60 tablet, Rfl: 6 .  rivaroxaban (XARELTO) 20 MG TABS tablet, Take 1 tablet (20 mg total) by mouth daily with supper., Disp: 30 tablet, Rfl: 0 .  sacubitril-valsartan (ENTRESTO) 24-26 MG, Take 1 tablet by mouth 2 (two) times daily., Disp: 60 tablet, Rfl: 6 .  sotalol (BETAPACE) 80 MG tablet, Take 1 tablet (80 mg total) by mouth every 12 (twelve) hours., Disp: 60 tablet, Rfl: 6 .  spironolactone (ALDACTONE) 25 MG tablet, Take 1 tablet (25 mg total) by mouth daily., Disp: 30 tablet, Rfl: 0 .  torsemide (DEMADEX) 20 MG tablet, Take 3 tablets (60 mg total) by mouth 2 (two) times daily., Disp: 180 tablet, Rfl: 5 No Known Allergies   Social History   Social History  . Marital status: Divorced    Spouse name: N/A  . Number of children: 2  . Years of education: N/A   Occupational History  . scrap metal   . disabled, odd jobs    Social History Main Topics  . Smoking status: Former Smoker    Packs/day: 0.50  Years: 4.00    Types: Cigarettes  . Smokeless tobacco: Never Used     Comment: about a pack a week. social  . Alcohol use 25.2 oz/week    42 Cans of beer per week     Comment: 6 beers per day  . Drug use: Yes    Frequency: 7.0 times per week    Types: Codeine, Marijuana     Comment: has discontinued  . Sexual activity: Yes    Partners: Female   Other Topics Concern  . Not on file   Social History Narrative   ** Merged History Encounter **        Physical Exam  Constitutional: He is oriented to person, place, and time.  Neck: No JVD present.  Cardiovascular: Normal rate and regular rhythm.   Pulmonary/Chest: Breath sounds  normal. No respiratory distress. He has no wheezes. He has no rales.  Abdominal: Soft.  Musculoskeletal: Normal range of motion. He exhibits no edema.  Neurological: He is alert and oriented to person, place, and time.  Skin: Skin is warm and dry.  Psychiatric: He has a normal mood and affect.        Future Appointments Date Time Provider Department Center  07/02/2017 9:40 AM [facility] DEVICE REMOTES [facility]  07/10/2017 10:00 AM [facility] ECHO 1-BU[facility]  07/10/2017 11:00 AM [name], MD [facility] None  09/14/2017 10:30 AM [name], MD [facility]   BP (!) 80/52 (BP Location: Left Arm, Patient Position: Sitting, Cuff Size: Large)   Pulse 68   Resp 16   Wt 295 lb (133.8 kg)   SpO2 98%   BMI 35.91 kg/m  Weight yesterday- 297 lbs Last visit weight- 300 lbs  Mr Bress was seen at home today and reports feeling well. He denied SOB, dizziness, headache or orthopnea.His medications were verified and his pillbox was refilled. Fluoxetine was called in to the pharmacy but they advised it was not able to be ordered until the weekend. Mr Perlow said he would order it himself. He was found to be hypotensive at 80/52 mm/Hg. He denied orthostatic dizziness or related symptoms and denied dietary or fluid intake changes. I contacted the clinic and Leavy Cella was going to speak with a provider and get their input.   Time spent with patient: 44 minutes   Jacqualine Code, EMT 06/14/17  ACTION: Home visit completed Next visit planned for 1 week

## 2017-06-14 NOTE — Telephone Encounter (Signed)
Advanced Heart Failure Triage Encounter  Patient Name: [name] [name]   Date of Call: 06/14/2017  Problem:  [name] called stating pts blood pressure was 80/52 today. I called patient directly who stated he felt fine no fatigue, dizziness, etc.  [name] wanted to know if we wanted to make any med changes at this time or just continue to monitor blood pressure.   Message routed to Amy for advice.   Plan:   Modesta Messing, CMA

## 2017-06-18 ENCOUNTER — Other Ambulatory Visit: Payer: Self-pay | Admitting: Internal Medicine

## 2017-06-19 ENCOUNTER — Telehealth: Payer: Self-pay | Admitting: *Deleted

## 2017-06-19 NOTE — Telephone Encounter (Signed)
[facility] request for CPAP supplies signed by Dr. Tresa Endo and returned to [facility]

## 2017-06-20 ENCOUNTER — Telehealth (HOSPITAL_COMMUNITY): Payer: Self-pay

## 2017-06-21 NOTE — Telephone Encounter (Signed)
[name] [name] was called to schedule an appointment who stated he was not home today and he was going to be going on vacation tomorrow morning. He stated he had his medications and would be able to take them with him on vacation and did not think it was necessary for me to meet with him before he left. I will follow up in two weeks.

## 2017-06-22 ENCOUNTER — Other Ambulatory Visit: Payer: Self-pay | Admitting: Internal Medicine

## 2017-06-27 ENCOUNTER — Telehealth: Payer: Self-pay | Admitting: Licensed Clinical Social Worker

## 2017-06-27 NOTE — Telephone Encounter (Signed)
CSW requested to contact patient for supportive intervention by [facility] Paramedic as patient had expressed depressed mood. CSW contacted patient who spoke about his frustrations and feeling down. Patient states he will explore a prescription of Paxil with his primary MD as he has been on this medication in the past. Patient has an upcoming appointment in the HF clinic and will follow up with CSW at that time. CSW will continue to follow through the [facility] for care coordination and any other needs as they arise. Lasandra Beech, LCSW, CCSW-MCS [phone]

## 2017-07-02 ENCOUNTER — Ambulatory Visit (INDEPENDENT_AMBULATORY_CARE_PROVIDER_SITE_OTHER): Payer: Self-pay | Admitting: *Deleted

## 2017-07-02 DIAGNOSIS — I428 Other cardiomyopathies: Secondary | ICD-10-CM

## 2017-07-02 NOTE — Progress Notes (Signed)
Remote ICD transmission.   

## 2017-07-03 ENCOUNTER — Other Ambulatory Visit: Payer: Self-pay | Admitting: Internal Medicine

## 2017-07-04 LAB — CUP PACEART REMOTE DEVICE CHECK
Brady Statistic AP VP Percent: 48.7 %
Brady Statistic AP VS Percent: 0.06 %
Brady Statistic AS VP Percent: 49.36 %
Brady Statistic AS VS Percent: 1.88 %
Date Time Interrogation Session: 20181015073323
HighPow Impedance: 59 Ohm
Implantable Lead Implant Date: 20160727
Implantable Lead Implant Date: 20160727
Implantable Lead Location: 753858
Lead Channel Impedance Value: 304 Ohm
Lead Channel Impedance Value: 304 Ohm
Lead Channel Impedance Value: 342 Ohm
Lead Channel Impedance Value: 418 Ohm
Lead Channel Impedance Value: 608 Ohm
Lead Channel Impedance Value: 608 Ohm
Lead Channel Impedance Value: 608 Ohm
Lead Channel Pacing Threshold Amplitude: 0.5 V
Lead Channel Pacing Threshold Amplitude: 0.5 V
Lead Channel Pacing Threshold Amplitude: 1.375 V
Lead Channel Pacing Threshold Pulse Width: 0.4 ms
Lead Channel Pacing Threshold Pulse Width: 0.4 ms
Lead Channel Pacing Threshold Pulse Width: 0.6 ms
Lead Channel Sensing Intrinsic Amplitude: 2.125 mV
Lead Channel Setting Pacing Amplitude: 1.5 V
Lead Channel Setting Pacing Amplitude: 2 V
Lead Channel Setting Sensing Sensitivity: 0.3 mV
MDC IDC LEAD IMPLANT DT: 20160727
MDC IDC LEAD LOCATION: 753859
MDC IDC LEAD LOCATION: 753860
MDC IDC MSMT BATTERY REMAINING LONGEVITY: 53 mo
MDC IDC MSMT BATTERY VOLTAGE: 2.98 V
MDC IDC MSMT LEADCHNL LV IMPEDANCE VALUE: 399 Ohm
MDC IDC MSMT LEADCHNL LV IMPEDANCE VALUE: 418 Ohm
MDC IDC MSMT LEADCHNL LV IMPEDANCE VALUE: 532 Ohm
MDC IDC MSMT LEADCHNL LV IMPEDANCE VALUE: 532 Ohm
MDC IDC MSMT LEADCHNL RA SENSING INTR AMPL: 2.125 mV
MDC IDC MSMT LEADCHNL RV IMPEDANCE VALUE: 399 Ohm
MDC IDC MSMT LEADCHNL RV IMPEDANCE VALUE: 456 Ohm
MDC IDC MSMT LEADCHNL RV SENSING INTR AMPL: 11.875 mV
MDC IDC MSMT LEADCHNL RV SENSING INTR AMPL: 11.875 mV
MDC IDC PG IMPLANT DT: 20160727
MDC IDC SET LEADCHNL LV PACING AMPLITUDE: 2.5 V
MDC IDC SET LEADCHNL LV PACING PULSEWIDTH: 0.6 ms
MDC IDC SET LEADCHNL RV PACING PULSEWIDTH: 0.4 ms
MDC IDC STAT BRADY RA PERCENT PACED: 47.9 %
MDC IDC STAT BRADY RV PERCENT PACED: 96.58 %

## 2017-07-06 ENCOUNTER — Encounter: Payer: Self-pay | Admitting: Cardiology

## 2017-07-06 ENCOUNTER — Other Ambulatory Visit (HOSPITAL_COMMUNITY): Payer: Self-pay

## 2017-07-06 ENCOUNTER — Telehealth (HOSPITAL_COMMUNITY): Payer: Self-pay | Admitting: Surgery

## 2017-07-06 MED FILL — AMIODARONE HCL 200 MG TAB: 200 | 34 days supply | Qty: 136 | Fill #0

## 2017-07-06 MED FILL — SPIRONOLACTONE 25 MG TABLET: 25 | 34 days supply | Qty: 34 | Fill #0

## 2017-07-06 MED FILL — POTASSIUM CL ER 20 MEQ TABL: 20 | 34 days supply | Qty: 136 | Fill #0

## 2017-07-06 MED FILL — TORSEMIDE 20 MG TABLET: 20 | 34 days supply | Qty: 204 | Fill #0

## 2017-07-06 MED FILL — ATORVASTATIN 80 MG TABLET: 80 | 34 days supply | Qty: 34 | Fill #0

## 2017-07-06 MED FILL — ASPIRIN ADULT LOW STRENGTH: 81 | 34 days supply | Qty: 34 | Fill #0

## 2017-07-06 NOTE — Progress Notes (Signed)
Letter  

## 2017-07-06 NOTE — Telephone Encounter (Signed)
[name] [name] with HF [name] [name] program called to inform me that patient lost his Medicaid Coverage.  He is concerned that he will not be able to afford medications going forward.  I have sent the following medications to the [facility] to be filled through the HF Fund.   Aspirin 81 mg daily Torsemide 60 mg BID Spironolactone 25 mg daily Potassium chloride 40 meq BID Atorvastatin 80 mg daily Amiodarone 400mg  BID [name] [name] will pick up medications today and deliver to [name] [name].

## 2017-07-06 NOTE — Progress Notes (Signed)
Paramedicine Encounter    Patient ID: [name] [name], male    DOB: Oct 23, 1960, 56 y.o.   MRN: [id number]   Patient Care Team: [name], MD as PCP - General (Cardiology) [name], MD (Family Medicine)  Patient Active Problem List   Diagnosis Date Noted  . VT (ventricular tachycardia) (HCC) 05/30/2017  . AKI (acute kidney injury) (HCC)   . Acute on chronic systolic CHF (congestive heart failure) (HCC) 01/29/2017  . HTN (hypertension) 12/25/2016  . Obesity 12/25/2016  . OSA (obstructive sleep apnea) 12/25/2016  . Hyperglycemia 12/25/2016  . Acute respiratory failure with hypoxia (HCC) 12/25/2016  . Chronic anticoagulation 06/27/2016  . Encounter for therapeutic drug monitoring 04/19/2015  . S/P ICD (internal cardiac defibrillator) procedure Medtronic CRT ICD 04/15/2015  . Atrial fibrillation with RVR - Paroxysmal 04/08/2015  . NICM- EF 25% 2012 & 20-25% 2016 04/06/2015  . Troponin level elevated 04/06/2015  . Rectal bleeding 10/26/2011  . Hydrocele, left 05/24/2011  . Tobacco use disorder- 1/2 pk a day   . H/O ETOH abuse 6-8 beers a day   . Obesity 09/27/2008  . CAD- minor CAD 2009 and 2016 09/27/2008  . VENTRICULAR TACHYCARDIA 09/27/2008  . Essential hypertension 06/19/2008  . Chronic systolic heart failure (HCC) 06/19/2008    Current Outpatient Prescriptions:  .  acetaminophen (TYLENOL) 325 MG tablet, Take 2 tablets (650 mg total) by mouth every 4 (four) hours as needed for headache or mild pain., Disp: , Rfl:  .  albuterol (PROVENTIL HFA;VENTOLIN HFA) 108 (90 Base) MCG/ACT inhaler, Inhale 1 puff into the lungs every 6 (six) hours as needed for wheezing or shortness of breath., Disp: 1 Inhaler, Rfl: 1 .  amiodarone (PACERONE) 200 MG tablet, Take 2 tablets (400 mg total) by mouth 2 (two) times daily. 400 mg (2-200mg  tabs) twice a day for one week then beginning 06/10/17 go to 200 mg twice a day, Disp: 75 tablet, Rfl: 6 .  atorvastatin (LIPITOR) 80 MG tablet, Take  1 tablet (80 mg total) by mouth daily at 6 PM., Disp: 30 tablet, Rfl: 11 .  FLUoxetine (PROZAC) 20 MG tablet, Take 20 mg by mouth daily., Disp: , Rfl:  .  potassium chloride SA (KLOR-CON M20) 20 MEQ tablet, Take 2 tablets (40 mEq total) by mouth 2 (two) times daily., Disp: 60 tablet, Rfl: 6 .  rivaroxaban (XARELTO) 20 MG TABS tablet, Take 1 tablet (20 mg total) by mouth daily with supper., Disp: 30 tablet, Rfl: 0 .  sacubitril-valsartan (ENTRESTO) 24-26 MG, Take 1 tablet by mouth 2 (two) times daily., Disp: 60 tablet, Rfl: 6 .  spironolactone (ALDACTONE) 25 MG tablet, TAKE 1 TABLET BY MOUTH ONCE DAILY, Disp: 90 tablet, Rfl: 1 .  torsemide (DEMADEX) 20 MG tablet, Take 3 tablets (60 mg total) by mouth 2 (two) times daily., Disp: 180 tablet, Rfl: 5 .  aspirin 81 MG chewable tablet, Chew 81 mg by mouth daily., Disp: , Rfl:  .  sotalol (BETAPACE) 80 MG tablet, Take 1 tablet (80 mg total) by mouth every 12 (twelve) hours. (Patient not taking: Reported on 07/06/2017), Disp: 60 tablet, Rfl: 6 No Known Allergies   Social History   Social History  . Marital status: Divorced    Spouse name: N/A  . Number of children: 2  . Years of education: N/A   Occupational History  . scrap metal   . disabled, odd jobs    Social History Main Topics  . Smoking status: Former Smoker    Packs/day:  0.50    Years: 4.00    Types: Cigarettes  . Smokeless tobacco: Never Used     Comment: about a pack a week. social  . Alcohol use 25.2 oz/week    42 Cans of beer per week     Comment: 6 beers per day  . Drug use: Yes    Frequency: 7.0 times per week    Types: Codeine, Marijuana     Comment: has discontinued  . Sexual activity: Yes    Partners: Female   Other Topics Concern  . Not on file   Social History Narrative   ** Merged History Encounter **        Physical Exam  Constitutional: He is oriented to person, place, and time.  Cardiovascular: Normal rate and regular rhythm.   Pulmonary/Chest: Effort  normal and breath sounds normal. No respiratory distress. He has no wheezes. He has no rales.  Abdominal: Soft.  Musculoskeletal: Normal range of motion. He exhibits no edema.  Neurological: He is alert and oriented to person, place, and time.  Skin: Skin is warm and dry.  Psychiatric: He has a normal mood and affect.        Future Appointments Date Time Provider Department Center  07/10/2017 10:00 AM [facility] ECHO [facility]  07/10/2017 11:00 AM Laurey MoraleMcLean, Dalton S, MD [facility] None  09/14/2017 10:30 AM Duke SalviaKlein, Steven C, MD [facility]  10/01/2017 9:40 AM [facility] DEVICE REMOTES [facility]   BP 104/76 (BP Location: Left Arm, Patient Position: Sitting, Cuff Size: Large)   Pulse 64   Resp 16   Wt 300 lb (136.1 kg)   SpO2 98%   BMI 36.52 kg/m  Weight yesterday- 299 lb Last visit weight- 295 lb  [name] [name] was seen at home today and reported feeling well. He was upset because he when he went to the pharmacy to get his medications and they told him his medicaid had been "turned off." We spent the next 45 minutes on hold to talk to his case worker at [facility]. Eventually we found out that his monthly income put him over the income threshold to receive medicaid assistance. I contacted the clinic and talked to [name] who said she would out in the paperwork the have his medicatons filled through the HF fund. I spent another 45 minutes with [name] [name] going through his options for Medicare Part D when medicare becomes active on January 1. He denied SOB, headache, dizziness or orthopnea. His medications were verified and his pillbox was refilled with the following exceptions: [place] through Sunday; [place] through Tuesday.  Time spent with patient: 116 minutes  Jacqualine CodeZackery R Sharyon [name], EMT 07/06/17  ACTION: Home visit completed Next visit planned for 1 week

## 2017-07-10 ENCOUNTER — Telehealth (HOSPITAL_COMMUNITY): Payer: Self-pay

## 2017-07-10 ENCOUNTER — Telehealth: Payer: Self-pay | Admitting: Licensed Clinical Social Worker

## 2017-07-10 ENCOUNTER — Inpatient Hospital Stay (HOSPITAL_COMMUNITY): Admission: RE | Admit: 2017-07-10 | Payer: Medicaid Other | Source: Ambulatory Visit | Admitting: Cardiology

## 2017-07-10 ENCOUNTER — Ambulatory Visit (HOSPITAL_COMMUNITY)
Admission: RE | Admit: 2017-07-10 | Discharge: 2017-07-10 | Disposition: A | Discharge status: Home / Self Care | Payer: Medicaid Other | Source: Ambulatory Visit | Attending: Internal Medicine | Admitting: Internal Medicine

## 2017-07-10 DIAGNOSIS — I5022 Chronic systolic (congestive) heart failure: Secondary | ICD-10-CM

## 2017-07-10 NOTE — Telephone Encounter (Signed)
CSW informed by Paramedic that patient was not coming to today's clinic appointment as he was told that he would need to pay $600 prior to being seen and he did not have the money. CSW attempted to reach patient to encourage clinic visit and no immediate collection would be obtained at that time. Unfortunately, message left and patient was a no show for his appointment. CSW will continue to follow through the [facility] for care coordination and follow up with above noted issue. Lasandra Beech, LCSW, CCSW-MCS [phone]

## 2017-07-10 NOTE — Telephone Encounter (Signed)
I called [name] [name] because he missed his appointment at the clinic today. He received a phone call saying his appointment was going to cost him $600 because his Medicaid would not cover it. He stated he was not going to be able to pay the $600 for his appointment and told the person on the phone that he wanted to cancel the appointment. I spoke to [place] who told him that nobody at the clinic was aware of the phone call and he should come anyway because nobody was going to expect payment nor try to collect it. I relayed this information and [name] who stated she was going to forward his information to scheduling to reschedule his echo and follow up.

## 2017-07-11 ENCOUNTER — Telehealth (HOSPITAL_COMMUNITY): Payer: Self-pay | Admitting: Cardiology

## 2017-07-11 NOTE — Telephone Encounter (Signed)
Left vm for patient to call back to reschedule Echo and f/u with Dr. Shirlee Latch, possibly 08/17/17, per Meredith Staggers, RN.

## 2017-07-12 ENCOUNTER — Other Ambulatory Visit (HOSPITAL_COMMUNITY): Payer: Self-pay

## 2017-07-12 NOTE — Progress Notes (Signed)
Paramedicine Encounter    Patient ID: [name] [name], male    DOB: 1961/01/11, 56 y.o.   MRN: [id number]   Patient Care Team: [name] [name], James, MD as PCP - General (Cardiology) [name] [name], [name] Oliver, MD (Family Medicine)  Patient Active Problem List   Diagnosis Date Noted  . VT (ventricular tachycardia) (HCC) 05/30/2017  . AKI (acute kidney injury) (HCC)   . Acute on chronic systolic CHF (congestive heart failure) (HCC) 01/29/2017  . HTN (hypertension) 12/25/2016  . Obesity 12/25/2016  . OSA (obstructive sleep apnea) 12/25/2016  . Hyperglycemia 12/25/2016  . Acute respiratory failure with hypoxia (HCC) 12/25/2016  . Chronic anticoagulation 06/27/2016  . Encounter for therapeutic drug monitoring 04/19/2015  . S/P ICD (internal cardiac defibrillator) procedure Medtronic CRT ICD 04/15/2015  . Atrial fibrillation with RVR - Paroxysmal 04/08/2015  . NICM- EF 25% 2012 & 20-25% 2016 04/06/2015  . Troponin level elevated 04/06/2015  . Rectal bleeding 10/26/2011  . Hydrocele, left 05/24/2011  . Tobacco use disorder- 1/2 pk a day   . H/O ETOH abuse 6-8 beers a day   . Obesity 09/27/2008  . CAD- minor CAD 2009 and 2016 09/27/2008  . VENTRICULAR TACHYCARDIA 09/27/2008  . Essential hypertension 06/19/2008  . Chronic systolic heart failure (HCC) 06/19/2008    Current Outpatient Prescriptions:  .  acetaminophen (TYLENOL) 325 MG tablet, Take 2 tablets (650 mg total) by mouth every 4 (four) hours as needed for headache or mild pain., Disp: , Rfl:  .  albuterol (PROVENTIL HFA;VENTOLIN HFA) 108 (90 Base) MCG/ACT inhaler, Inhale 1 puff into the lungs every 6 (six) hours as needed for wheezing or shortness of breath., Disp: 1 Inhaler, Rfl: 1 .  amiodarone (PACERONE) 200 MG tablet, Take 2 tablets (400 mg total) by mouth 2 (two) times daily. 400 mg (2-200mg  tabs) twice a day for one week then beginning 06/10/17 go to 200 mg twice a day, Disp: 75 tablet, Rfl: 6 .  aspirin 81 MG chewable tablet, Chew 81 mg  by mouth daily., Disp: , Rfl:  .  atorvastatin (LIPITOR) 80 MG tablet, Take 1 tablet (80 mg total) by mouth daily at 6 PM., Disp: 30 tablet, Rfl: 11 .  FLUoxetine (PROZAC) 20 MG tablet, Take 20 mg by mouth daily., Disp: , Rfl:  .  potassium chloride SA (KLOR-CON M20) 20 MEQ tablet, Take 2 tablets (40 mEq total) by mouth 2 (two) times daily., Disp: 60 tablet, Rfl: 6 .  rivaroxaban (XARELTO) 20 MG TABS tablet, Take 1 tablet (20 mg total) by mouth daily with supper., Disp: 30 tablet, Rfl: 0 .  sacubitril-valsartan (ENTRESTO) 24-26 MG, Take 1 tablet by mouth 2 (two) times daily., Disp: 60 tablet, Rfl: 6 .  spironolactone (ALDACTONE) 25 MG tablet, TAKE 1 TABLET BY MOUTH ONCE DAILY, Disp: 90 tablet, Rfl: 1 .  torsemide (DEMADEX) 20 MG tablet, Take 3 tablets (60 mg total) by mouth 2 (two) times daily., Disp: 180 tablet, Rfl: 5 .  sotalol (BETAPACE) 80 MG tablet, Take 1 tablet (80 mg total) by mouth every 12 (twelve) hours. (Patient not taking: Reported on 07/06/2017), Disp: 60 tablet, Rfl: 6 No Known Allergies   Social History   Social History  . Marital status: Divorced    Spouse name: N/A  . Number of children: 2  . Years of education: N/A   Occupational History  . scrap metal   . disabled, odd jobs    Social History Main Topics  . Smoking status: Former Smoker    Packs/day:  0.50    Years: 4.00    Types: Cigarettes  . Smokeless tobacco: Never Used     Comment: about a pack a week. social  . Alcohol use 25.2 oz/week    42 Cans of beer per week     Comment: 6 beers per day  . Drug use: Yes    Frequency: 7.0 times per week    Types: Codeine, Marijuana     Comment: has discontinued  . Sexual activity: Yes    Partners: Female   Other Topics Concern  . Not on file   Social History Narrative   ** Merged History Encounter **        Physical Exam  Constitutional: He is oriented to person, place, and time.  Cardiovascular: Normal rate and regular rhythm.   Pulmonary/Chest: Effort  normal and breath sounds normal. No respiratory distress. He has no wheezes. He has no rales.  Abdominal: Soft. He exhibits no distension.  Musculoskeletal: Normal range of motion. He exhibits no edema.  Neurological: He is alert and oriented to person, place, and time.  Skin: Skin is warm and dry.  Psychiatric: He has a normal mood and affect.        Future Appointments Date Time Provider Department Center  09/14/2017 10:30 AM [name] Salvia, MD [facility]  10/01/2017 9:40 AM [facility] DEVICE REMOTES [facility]   BP 124/72 (BP Location: Left Arm, Patient Position: Sitting, Cuff Size: Large)   Pulse 76   Resp 16   Wt 292 lb (132.5 kg)   SpO2 98%   BMI 35.54 kg/m  Weight yesterday- 298 lb Last visit weight- 300 lb  [name] [name] was seen at home today and reported feeling well. He denied SOB or headache. He is currently not taking Sotalol because it could not be filled through the HF Fund.I have contacted the HF clinic regarding this issue and they have advised that the cash price for this medications should be affordable and it was necessary for him to continue on it. I passed this on to [name] [name] and he was agreeable. His medications were verified and his pillbox was refilled. Fluoxetine and Xarelto were ordered from the [facility] pharmacy.  Time spent with patient: 38 minutes  [name] [name], EMT 07/12/17  ACTION: Home visit completed Next visit planned for 1 week

## 2017-07-19 MED FILL — XARELTO 20 MG TABLET: 20 | 30 days supply | Qty: 30 | Fill #0

## 2017-07-19 NOTE — Telephone Encounter (Signed)
Spoke with patient, he is scheduled 07/30/17 @ 2 pm for Echo and f/u with Dr. Shirlee Latch.

## 2017-07-25 ENCOUNTER — Telehealth (HOSPITAL_COMMUNITY): Payer: Self-pay

## 2017-07-25 NOTE — Telephone Encounter (Signed)
I called Mr Filley to schedule an appointment however her did not answer. A VM was left requesting he call me back.

## 2017-07-27 ENCOUNTER — Other Ambulatory Visit (HOSPITAL_COMMUNITY): Payer: Self-pay

## 2017-07-27 NOTE — Progress Notes (Signed)
Paramedicine Encounter    Patient ID: [name] [name], male    DOB: 07/28/1961, 56 y.o.   MRN: [id number]   Patient Care Team: [name] [name], James, MD as PCP - General (Cardiology) Kaleen MaskElkins, [name] Oliver, MD (Family Medicine)  Patient Active Problem List   Diagnosis Date Noted  . VT (ventricular tachycardia) (HCC) 05/30/2017  . AKI (acute kidney injury) (HCC)   . Acute on chronic systolic CHF (congestive heart failure) (HCC) 01/29/2017  . HTN (hypertension) 12/25/2016  . Obesity 12/25/2016  . OSA (obstructive sleep apnea) 12/25/2016  . Hyperglycemia 12/25/2016  . Acute respiratory failure with hypoxia (HCC) 12/25/2016  . Chronic anticoagulation 06/27/2016  . Encounter for therapeutic drug monitoring 04/19/2015  . S/P ICD (internal cardiac defibrillator) procedure Medtronic CRT ICD 04/15/2015  . Atrial fibrillation with RVR - Paroxysmal 04/08/2015  . NICM- EF 25% 2012 & 20-25% 2016 04/06/2015  . Troponin level elevated 04/06/2015  . Rectal bleeding 10/26/2011  . Hydrocele, left 05/24/2011  . Tobacco use disorder- 1/2 pk a day   . H/O ETOH abuse 6-8 beers a day   . Obesity 09/27/2008  . CAD- minor CAD 2009 and 2016 09/27/2008  . VENTRICULAR TACHYCARDIA 09/27/2008  . Essential hypertension 06/19/2008  . Chronic systolic heart failure (HCC) 06/19/2008    Current Outpatient Medications:  .  acetaminophen (TYLENOL) 325 MG tablet, Take 2 tablets (650 mg total) by mouth every 4 (four) hours as needed for headache or mild pain., Disp: , Rfl:  .  albuterol (PROVENTIL HFA;VENTOLIN HFA) 108 (90 Base) MCG/ACT inhaler, Inhale 1 puff into the lungs every 6 (six) hours as needed for wheezing or shortness of breath., Disp: 1 Inhaler, Rfl: 1 .  amiodarone (PACERONE) 200 MG tablet, Take 2 tablets (400 mg total) by mouth 2 (two) times daily. 400 mg (2-200mg  tabs) twice a day for one week then beginning 06/10/17 go to 200 mg twice a day, Disp: 75 tablet, Rfl: 6 .  aspirin 81 MG chewable tablet, Chew 81 mg by  mouth daily., Disp: , Rfl:  .  atorvastatin (LIPITOR) 80 MG tablet, Take 1 tablet (80 mg total) by mouth daily at 6 PM., Disp: 30 tablet, Rfl: 11 .  FLUoxetine (PROZAC) 20 MG tablet, Take 20 mg by mouth daily., Disp: , Rfl:  .  potassium chloride SA (KLOR-CON M20) 20 MEQ tablet, Take 2 tablets (40 mEq total) by mouth 2 (two) times daily., Disp: 60 tablet, Rfl: 6 .  rivaroxaban (XARELTO) 20 MG TABS tablet, Take 1 tablet (20 mg total) by mouth daily with supper., Disp: 30 tablet, Rfl: 0 .  sacubitril-valsartan (ENTRESTO) 24-26 MG, Take 1 tablet by mouth 2 (two) times daily., Disp: 60 tablet, Rfl: 6 .  sotalol (BETAPACE) 80 MG tablet, Take 1 tablet (80 mg total) by mouth every 12 (twelve) hours., Disp: 60 tablet, Rfl: 6 .  spironolactone (ALDACTONE) 25 MG tablet, TAKE 1 TABLET BY MOUTH ONCE DAILY, Disp: 90 tablet, Rfl: 1 .  torsemide (DEMADEX) 20 MG tablet, Take 3 tablets (60 mg total) by mouth 2 (two) times daily., Disp: 180 tablet, Rfl: 5 No Known Allergies    Social History   Socioeconomic History  . Marital status: Divorced    Spouse name: Not on file  . Number of children: 2  . Years of education: Not on file  . Highest education level: Not on file  Social Needs  . Financial resource strain: Not on file  . Food insecurity - worry: Not on file  . Food insecurity -  inability: Not on file  . Transportation needs - medical: Not on file  . Transportation needs - non-medical: Not on file  Occupational History  . Occupation: Copy  . Occupation: disabled, odd jobs  Tobacco Use  . Smoking status: Former Smoker    Packs/day: 0.50    Years: 4.00    Pack years: 2.00    Types: Cigarettes  . Smokeless tobacco: Never Used  . Tobacco comment: about a pack a week. social  Substance and Sexual Activity  . Alcohol use: Yes    Alcohol/week: 25.2 oz    Types: 42 Cans of beer per week    Comment: 6 beers per day  . Drug use: Yes    Frequency: 7.0 times per week    Types: Codeine,  Marijuana    Comment: has discontinued  . Sexual activity: Yes    Partners: Female  Other Topics Concern  . Not on file  Social History Narrative   ** Merged History Encounter **        Physical Exam  Constitutional: He is oriented to person, place, and time.  Neck: Normal range of motion. No JVD present.  Cardiovascular: Normal rate and regular rhythm.  Pulmonary/Chest: Effort normal and breath sounds normal. No respiratory distress. He has no wheezes. He has no rales.  Abdominal: Soft.  Musculoskeletal: Normal range of motion. He exhibits no edema.  Neurological: He is alert and oriented to person, place, and time.  Skin: Skin is warm and dry.  Psychiatric: He has a normal mood and affect.        Future Appointments  Date Time Provider Department Center  07/30/2017  2:00 PM [facility] ECHO 1-BUZZ [facility]  07/30/2017  3:00 PM [name], MD [facility] None  09/14/2017 10:30 AM [name], MD [facility]  10/01/2017  9:40 AM [facility] DEVICE REMOTES [facility]    BP 100/68 (BP Location: Left Arm, Patient Position: Sitting, Cuff Size: Large)   Pulse 64   Resp 16   Wt 300 lb (136.1 kg)   SpO2 98%   BMI 36.52 kg/m  Weight yesterday- 298 lb Last visit weight- 292 lb  [name] was seen at home today and reported feeling well. He denied SOB, headache, dizziness or orthopnea. He was given information the new HF Boost support group. He did not need any refills at this time. His weight was at 300 lb which is up substantially over two weeks but he does not appear to have excess fluid in his extremities. I verified his medications and refilled his pillbox.   Time spent with patient: 42 minutes  Jacqualine Code, EMT 07/27/17  ACTION: Home visit completed Next visit planned for 1 week

## 2017-07-30 ENCOUNTER — Encounter (HOSPITAL_COMMUNITY): Payer: Self-pay | Admitting: Cardiology

## 2017-07-30 ENCOUNTER — Ambulatory Visit (HOSPITAL_BASED_OUTPATIENT_CLINIC_OR_DEPARTMENT_OTHER)
Admission: RE | Admit: 2017-07-30 | Discharge: 2017-07-30 | Disposition: A | Discharge status: Home / Self Care | Payer: Self-pay | Source: Ambulatory Visit | Attending: Internal Medicine | Admitting: Internal Medicine

## 2017-07-30 ENCOUNTER — Ambulatory Visit (HOSPITAL_COMMUNITY)
Admission: RE | Admit: 2017-07-30 | Discharge: 2017-07-30 | Disposition: A | Discharge status: Home / Self Care | Payer: Self-pay | Source: Ambulatory Visit | Attending: Cardiology | Admitting: Cardiology

## 2017-07-30 VITALS — BP 120/70 | HR 61 | Wt 305.0 lb

## 2017-07-30 DIAGNOSIS — I480 Paroxysmal atrial fibrillation: Secondary | ICD-10-CM | POA: Insufficient documentation

## 2017-07-30 DIAGNOSIS — J449 Chronic obstructive pulmonary disease, unspecified: Secondary | ICD-10-CM | POA: Insufficient documentation

## 2017-07-30 DIAGNOSIS — I472 Ventricular tachycardia, unspecified: Secondary | ICD-10-CM

## 2017-07-30 DIAGNOSIS — Z8249 Family history of ischemic heart disease and other diseases of the circulatory system: Secondary | ICD-10-CM | POA: Insufficient documentation

## 2017-07-30 DIAGNOSIS — F1411 Cocaine abuse, in remission: Secondary | ICD-10-CM | POA: Insufficient documentation

## 2017-07-30 DIAGNOSIS — F1010 Alcohol abuse, uncomplicated: Secondary | ICD-10-CM | POA: Insufficient documentation

## 2017-07-30 DIAGNOSIS — I5022 Chronic systolic (congestive) heart failure: Secondary | ICD-10-CM

## 2017-07-30 DIAGNOSIS — F17210 Nicotine dependence, cigarettes, uncomplicated: Secondary | ICD-10-CM | POA: Insufficient documentation

## 2017-07-30 DIAGNOSIS — Z9889 Other specified postprocedural states: Secondary | ICD-10-CM | POA: Insufficient documentation

## 2017-07-30 DIAGNOSIS — I4891 Unspecified atrial fibrillation: Secondary | ICD-10-CM

## 2017-07-30 DIAGNOSIS — G4733 Obstructive sleep apnea (adult) (pediatric): Secondary | ICD-10-CM | POA: Insufficient documentation

## 2017-07-30 DIAGNOSIS — Z79899 Other long term (current) drug therapy: Secondary | ICD-10-CM | POA: Insufficient documentation

## 2017-07-30 DIAGNOSIS — F1211 Cannabis abuse, in remission: Secondary | ICD-10-CM | POA: Insufficient documentation

## 2017-07-30 DIAGNOSIS — I110 Hypertensive heart disease with heart failure: Secondary | ICD-10-CM | POA: Insufficient documentation

## 2017-07-30 DIAGNOSIS — Z95810 Presence of automatic (implantable) cardiac defibrillator: Secondary | ICD-10-CM | POA: Insufficient documentation

## 2017-07-30 DIAGNOSIS — Z7902 Long term (current) use of antithrombotics/antiplatelets: Secondary | ICD-10-CM | POA: Insufficient documentation

## 2017-07-30 LAB — COMPREHENSIVE METABOLIC PANEL
ALBUMIN: 3.8 g/dL (ref 3.5–5.0)
ALT: 36 U/L (ref 17–63)
AST: 24 U/L (ref 15–41)
Alkaline Phosphatase: 81 U/L (ref 38–126)
Anion gap: 6 (ref 5–15)
BUN: 14 mg/dL (ref 6–20)
CHLORIDE: 101 mmol/L (ref 101–111)
CO2: 29 mmol/L (ref 22–32)
Calcium: 9.1 mg/dL (ref 8.9–10.3)
Creatinine, Ser: 1.15 mg/dL (ref 0.61–1.24)
GFR calc Af Amer: >60 mL/min (ref >60)
GFR calc non Af Amer: >60 mL/min (ref >60)
GLUCOSE: 92 mg/dL (ref 65–99)
POTASSIUM: 4.1 mmol/L (ref 3.5–5.1)
SODIUM: 136 mmol/L (ref 135–145)
Total Bilirubin: 0.8 mg/dL (ref 0.3–1.2)
Total Protein: 6.6 g/dL (ref 6.5–8.1)

## 2017-07-30 LAB — TSH: TSH: 0.86 u[IU]/mL (ref 0.350–4.500)

## 2017-07-30 MED ORDER — AMIODARONE HCL 200 MG PO TABS: 200.0000 mg | ORAL_TABLET | Freq: Every day | ORAL | 6 refills | Status: DC

## 2017-07-30 MED ORDER — SACUBITRIL-VALSARTAN 49-51 MG PO TABS: 1.0000 | ORAL_TABLET | Freq: Two times a day (BID) | ORAL | 3 refills | Status: DC

## 2017-07-30 NOTE — Progress Notes (Signed)
  Echocardiogram 2D Echocardiogram has been performed.  Pieter Partridge 07/30/2017, 2:43 PM

## 2017-07-30 NOTE — Patient Instructions (Signed)
INCREASE Entresto to 49/51mg  twice daily  DECREASE Amiodarone to 200mg  daily  STOP Aspirin   Routine lab work today. Will notify you of abnormal results  Labs in 10 days (bmet)  Follow up with Dr.[name] in 6 weeks

## 2017-07-31 NOTE — Progress Notes (Signed)
Advanced Heart Failure Clinic Note   Primary Care: Dr. Windle GuardWilson Elkins, MD HF Cardiology: Dr. Shirlee LatchMcLean  HPI: [name] is a 56 year old male with a past medical history of chronic systolic CHF (last EF 15% in April 2018), dilated NICM s/p BiV ICD, PAF with intermittent compliance with Xarelto, ETOH abuse, tobacco abuse, OSA on CPAP, and HTN.   Admitted 12/25/16-12/27/16 with acute on chronic systolic CHF, volume overload. He was diuresed 11 pounds on IV lasix.  Discharge weight was 294 pounds. It was felt that the etiology of his NICM was ETOH vs hypertension related.   Admitted 5/14-5/16/18 with volume overload, acute respiratory failure requiring Bipap. He was not taking his Entresto as he was confused about dosing and there was a mix up his pharmacy. He was diuresed with IV lasix, discharge weight was 295 pounds.   Admitted 05/30/17-06/02/17 with VT, ICD shock x 4. He is now on amiodarone and sotalol.  He returns today for followup of CHF.  Stable symptomatically.  Weight up 5 lbs.  Dyspnea after walking about 100 yards.  OK with yardwork.  No chest pain.  No orthopnea/PND.  No lightheadedness.  He still smokes rarely. He has cut back on ETOH, < 6 drinks/week.    Medtronic device interrogation: No recent VT or atrial fibrillation.  Fluid index < threshold.   Labs (9/18): K 3.8, creatinine 1.02  - ECG: NSR, BiV paced, QTc 528 msec  PMH: 1. Chronic systolic CHF: Medtronic CRT-D.  Nonischemic cardiomyopathy. Possibly ETOH-related.  - LHC (2016): No CAD.  - Echo (2016): EF 20-25%, diffuse hypokinesis.  - Echo (4/18): EF 15%, normal RV size and systolic function.  - Echo (11/18): EF 20%, mildly dilated LV with mild LVH, normal RV size with mildly decreased RV systolic function.  2. Atrial fibrillation: Paroxysmal.   3. OSA: On CPAP.  4. ETOH abuse: Prior heavy ETOH.  5. H/o VT 6. COPD: Prior smoker.   ROS: All systems reviewed and negative except as per HPI.   Current Outpatient Medications    Medication Sig Dispense Refill  . acetaminophen (TYLENOL) 325 MG tablet Take 2 tablets (650 mg total) by mouth every 4 (four) hours as needed for headache or mild pain.    Marland Kitchen. albuterol (PROVENTIL HFA;VENTOLIN HFA) 108 (90 Base) MCG/ACT inhaler Inhale 1 puff into the lungs every 6 (six) hours as needed for wheezing or shortness of breath. 1 Inhaler 1  . amiodarone (PACERONE) 200 MG tablet Take 1 tablet (200 mg total) daily by mouth. 30 tablet 6  . atorvastatin (LIPITOR) 80 MG tablet Take 1 tablet (80 mg total) by mouth daily at 6 PM. 30 tablet 11  . FLUoxetine (PROZAC) 20 MG tablet Take 20 mg by mouth daily.    . potassium chloride SA (KLOR-CON M20) 20 MEQ tablet Take 2 tablets (40 mEq total) by mouth 2 (two) times daily. 60 tablet 6  . rivaroxaban (XARELTO) 20 MG TABS tablet Take 1 tablet (20 mg total) by mouth daily with supper. 30 tablet 0  . sotalol (BETAPACE) 80 MG tablet Take 1 tablet (80 mg total) by mouth every 12 (twelve) hours. 60 tablet 6  . spironolactone (ALDACTONE) 25 MG tablet TAKE 1 TABLET BY MOUTH ONCE DAILY 90 tablet 1  . torsemide (DEMADEX) 20 MG tablet Take 3 tablets (60 mg total) by mouth 2 (two) times daily. 180 tablet 5  . sacubitril-valsartan (ENTRESTO) 49-51 MG Take 1 tablet 2 (two) times daily by mouth. 60 tablet 3  No current facility-administered medications for this encounter.     No Known Allergies    Social History   Socioeconomic History  . Marital status: Divorced    Spouse name: Not on file  . Number of children: 2  . Years of education: Not on file  . Highest education level: Not on file  Social Needs  . Financial resource strain: Not on file  . Food insecurity - worry: Not on file  . Food insecurity - inability: Not on file  . Transportation needs - medical: Not on file  . Transportation needs - non-medical: Not on file  Occupational History  . Occupation: Copy  . Occupation: disabled, odd jobs  Tobacco Use  . Smoking status: Former  Smoker    Packs/day: 0.50    Years: 4.00    Pack years: 2.00    Types: Cigarettes  . Smokeless tobacco: Never Used  . Tobacco comment: about a pack a week. social  Substance and Sexual Activity  . Alcohol use: Yes    Alcohol/week: 25.2 oz    Types: 42 Cans of beer per week    Comment: 6 beers per day  . Drug use: Yes    Frequency: 7.0 times per week    Types: Codeine, Marijuana    Comment: has discontinued  . Sexual activity: Yes    Partners: Female  Other Topics Concern  . Not on file  Social History Narrative   ** Merged History Encounter **          Family History  Problem Relation Age of Onset  . Other Mother        cardiac surgery. late 1990s  . Heart Problems Mother        CABG AGE 42  . Congestive Heart Failure Father   . Healthy Father        AGE 66  . Coronary artery disease Unknown   . Healthy Brother        AGE 1  . Healthy Sister        AGE 37  . Healthy Sister        AGE 29  . Healthy Son   . Healthy Son   . Healthy Daughter     Vitals:   07/30/17 1445  BP: 120/70  Pulse: 61  SpO2: 95%  Weight: (!) 305 lb (138.3 kg)   Wt Readings from Last 3 Encounters:  07/30/17 (!) 305 lb (138.3 kg)  07/27/17 300 lb (136.1 kg)  07/12/17 292 lb (132.5 kg)     PHYSICAL EXAM: General: NAD Neck: No JVD, no thyromegaly or thyroid nodule.  Lungs: Clear to auscultation bilaterally with normal respiratory effort. CV: Nondisplaced PMI.  Heart regular S1/S2, no S3/S4, no murmur.  No peripheral edema.  No carotid bruit.  Normal pedal pulses.  Abdomen: Soft, nontender, no hepatosplenomegaly, no distention.  Skin: Intact without lesions or rashes.  Neurologic: Alert and oriented x 3.  Psych: Normal affect. Extremities: No clubbing or cyanosis.  HEENT: Normal.   ASSESSMENT & PLAN: 1. Chronic systolic CHF: NICM, Echo 12/2016 EF 15%. Normal cors in July 2016. Medtronic CRT-D. ETOH vs. Hypertension.  NYHA class II symptoms.  He does not appear volume overloaded  by exam or Optivol.  - Continue torsemide 60 mg BID.  - Continue KCl BID - Increase Entresto to 49/51 bid.  BMET today and again in 10 days.  - Continue Spiro 25 mg daily - Intolerant to Coreg in the past with  hypotension, he is on sotalol.  2. PAF: NSR today on amiodarone.  - Decrease amiodarone to 200 mg daily.  Needs LFTs/TSH today.  Should have regular eye exam.   - Continue Xarelto, stop ASA given Xarelto use.  3. ETOH abuse: Suspect this contributes to cardiomyopathy.  He has cut back, I recommended that he continue to cut back to minimal/none. 4. Tobacco abuse: He smokes a few cigs/day.  Encouraged him to stop totally.  5. VT: In 9/18.  No further on device interrogation.  He is now on both sotalol (to lower DFTs) and amiodarone. QTc is 528 paced (want < 550 msec paced).  6. OSA: Compliant with CPAP hs.   Followup 6 wks.   Marca Ancona, MD 07/31/17

## 2017-08-02 ENCOUNTER — Telehealth (HOSPITAL_COMMUNITY): Payer: Self-pay

## 2017-08-02 NOTE — Telephone Encounter (Signed)
I called [name] this evening to see how he was feeling and see if there was anything he needs tomorrow. He advised he was feeling well and was going to fill his own pillbox tonight. He stated he did not see a need for me to come tomorrow but was told to call me if he changed his mind. I will plan on seeing him next week.

## 2017-08-06 ENCOUNTER — Other Ambulatory Visit (HOSPITAL_COMMUNITY): Payer: Self-pay

## 2017-08-06 MED FILL — TORSEMIDE 20 MG TABLET: 20 | 34 days supply | Qty: 204 | Fill #1

## 2017-08-06 MED FILL — POTASSIUM CL ER 20 MEQ TABL: 20 | 34 days supply | Qty: 136 | Fill #1

## 2017-08-06 MED FILL — ATORVASTATIN 80 MG TABLET: 80 | 34 days supply | Qty: 34 | Fill #1

## 2017-08-06 MED FILL — SPIRONOLACTONE 25 MG TABLET: 25 | 34 days supply | Qty: 34 | Fill #1

## 2017-08-06 NOTE — Progress Notes (Signed)
Paramedicine Encounter    Patient ID: [name] [name], male    DOB: [birth year], 56 y.o.   MRN: [id number]   Patient Care Team: [name], MD as PCP - General (Cardiology) [name], MD (Family Medicine)  Patient Active Problem List   Diagnosis Date Noted  . VT (ventricular tachycardia) (HCC) 05/30/2017  . AKI (acute kidney injury) (HCC)   . Acute on chronic systolic CHF (congestive heart failure) (HCC) 01/29/2017  . HTN (hypertension) 12/25/2016  . Obesity 12/25/2016  . OSA (obstructive sleep apnea) 12/25/2016  . Hyperglycemia 12/25/2016  . Acute respiratory failure with hypoxia (HCC) 12/25/2016  . Chronic anticoagulation 06/27/2016  . Encounter for therapeutic drug monitoring 04/19/2015  . S/P ICD (internal cardiac defibrillator) procedure Medtronic CRT ICD 04/15/2015  . Atrial fibrillation with RVR - Paroxysmal 04/08/2015  . NICM- EF 25% 2012 & 20-25% 2016 04/06/2015  . Troponin level elevated 04/06/2015  . Rectal bleeding 10/26/2011  . Hydrocele, left 05/24/2011  . Tobacco use disorder- 1/2 pk a day   . H/O ETOH abuse 6-8 beers a day   . Obesity 09/27/2008  . CAD- minor CAD 2009 and 2016 09/27/2008  . VENTRICULAR TACHYCARDIA 09/27/2008  . Essential hypertension 06/19/2008  . Chronic systolic heart failure (HCC) 06/19/2008    Current Outpatient Medications:  .  acetaminophen (TYLENOL) 325 MG tablet, Take 2 tablets (650 mg total) by mouth every 4 (four) hours as needed for headache or mild pain., Disp: , Rfl:  .  albuterol (PROVENTIL HFA;VENTOLIN HFA) 108 (90 Base) MCG/ACT inhaler, Inhale 1 puff into the lungs every 6 (six) hours as needed for wheezing or shortness of breath., Disp: 1 Inhaler, Rfl: 1 .  amiodarone (PACERONE) 200 MG tablet, Take 1 tablet (200 mg total) daily by mouth., Disp: 30 tablet, Rfl: 6 .  atorvastatin (LIPITOR) 80 MG tablet, Take 1 tablet (80 mg total) by mouth daily at 6 PM., Disp: 30 tablet, Rfl: 11 .  FLUoxetine (PROZAC) 20 MG tablet,  Take 20 mg by mouth daily., Disp: , Rfl:  .  potassium chloride SA (KLOR-CON M20) 20 MEQ tablet, Take 2 tablets (40 mEq total) by mouth 2 (two) times daily., Disp: 60 tablet, Rfl: 6 .  rivaroxaban (XARELTO) 20 MG TABS tablet, Take 1 tablet (20 mg total) by mouth daily with supper., Disp: 30 tablet, Rfl: 0 .  sacubitril-valsartan (ENTRESTO) 49-51 MG, Take 1 tablet 2 (two) times daily by mouth., Disp: 60 tablet, Rfl: 3 .  sotalol (BETAPACE) 80 MG tablet, Take 1 tablet (80 mg total) by mouth every 12 (twelve) hours., Disp: 60 tablet, Rfl: 6 .  spironolactone (ALDACTONE) 25 MG tablet, TAKE 1 TABLET BY MOUTH ONCE DAILY, Disp: 90 tablet, Rfl: 1 .  torsemide (DEMADEX) 20 MG tablet, Take 3 tablets (60 mg total) by mouth 2 (two) times daily., Disp: 180 tablet, Rfl: 5 No Known Allergies    Social History   Socioeconomic History  . Marital status: Divorced    Spouse name: Not on file  . Number of children: 2  . Years of education: Not on file  . Highest education level: Not on file  Social Needs  . Financial resource strain: Not on file  . Food insecurity - worry: Not on file  . Food insecurity - inability: Not on file  . Transportation needs - medical: Not on file  . Transportation needs - non-medical: Not on file  Occupational History  . Occupation: Copy  . Occupation: disabled, odd jobs  Tobacco Use  .  Smoking status: Former Smoker    Packs/day: 0.50    Years: 4.00    Pack years: 2.00    Types: Cigarettes  . Smokeless tobacco: Never Used  . Tobacco comment: about a pack a week. social  Substance and Sexual Activity  . Alcohol use: Yes    Alcohol/week: 25.2 oz    Types: 42 Cans of beer per week    Comment: 6 beers per day  . Drug use: Yes    Frequency: 7.0 times per week    Types: Codeine, Marijuana    Comment: has discontinued  . Sexual activity: Yes    Partners: Female  Other Topics Concern  . Not on file  Social History Narrative   ** Merged History Encounter **         Physical Exam  Constitutional: He is oriented to person, place, and time.  Cardiovascular: Normal rate and regular rhythm.  Pulmonary/Chest: Effort normal and breath sounds normal. No respiratory distress. He has no wheezes. He has no rales.  Abdominal: Soft. He exhibits no distension.  Musculoskeletal: Normal range of motion. He exhibits no edema.  Neurological: He is alert and oriented to person, place, and time.  Skin: Skin is warm and dry.  Psychiatric: He has a normal mood and affect.        Future Appointments  Date Time Provider Department Center  08/13/2017  9:00 AM [facility] LAB [facility] None  09/14/2017 10:30 AM [name], MD [facility]  09/25/2017  9:20 AM [name], MD [facility] None  10/01/2017  9:40 AM [facility] DEVICE REMOTES [facility]    BP 94/60 (BP Location: Left Arm, Patient Position: Sitting, Cuff Size: Large)   Pulse 60   Resp 16   Wt 299 lb (135.6 kg)   SpO2 98%   BMI 36.40 kg/m   Weight yesterday- 301 lb Last visit weight- 305 lb  [name] was seen at home today and reported feeling well. He denied SOB, headache, dizziness or orthopnea. He has been active around his house and says if he gets tired he just takes a break to avoid getting SOB. His medications were verified and his pillbox was refilled. Potassium, spironolactone, atorvastatin, Torsemide [facility]. He is ordering fluoxetine from [facility].   Time spent with patient: 38 minutes   Jacqualine Code, EMT 08/06/17  ACTION: Home visit completed Next visit planned for 1 week

## 2017-08-13 ENCOUNTER — Ambulatory Visit (HOSPITAL_COMMUNITY)
Admission: RE | Admit: 2017-08-13 | Discharge: 2017-08-13 | Disposition: A | Discharge status: Home / Self Care | Payer: Medicaid Other | Source: Ambulatory Visit | Attending: Cardiology | Admitting: Cardiology

## 2017-08-13 ENCOUNTER — Other Ambulatory Visit (HOSPITAL_COMMUNITY): Payer: Self-pay | Admitting: *Deleted

## 2017-08-13 DIAGNOSIS — I5022 Chronic systolic (congestive) heart failure: Secondary | ICD-10-CM

## 2017-08-13 LAB — BASIC METABOLIC PANEL
Anion gap: 8 (ref 5–15)
BUN: 16 mg/dL (ref 6–20)
CHLORIDE: 99 mmol/L — AB (ref 101–111)
CO2: 29 mmol/L (ref 22–32)
CREATININE: 1.19 mg/dL (ref 0.61–1.24)
Calcium: 9.2 mg/dL (ref 8.9–10.3)
GFR calc Af Amer: >60 mL/min (ref >60)
GFR calc non Af Amer: >60 mL/min (ref >60)
GLUCOSE: 84 mg/dL (ref 65–99)
POTASSIUM: 4.4 mmol/L (ref 3.5–5.1)
SODIUM: 136 mmol/L (ref 135–145)

## 2017-08-21 ENCOUNTER — Telehealth (HOSPITAL_COMMUNITY): Payer: Self-pay | Admitting: Pharmacist

## 2017-08-21 ENCOUNTER — Other Ambulatory Visit (HOSPITAL_COMMUNITY): Payer: Self-pay

## 2017-08-21 ENCOUNTER — Other Ambulatory Visit: Payer: Self-pay | Admitting: Cardiology

## 2017-08-21 NOTE — Telephone Encounter (Signed)
Received a message from [place] (paramedic) that [facility] is stating that Mr. Job's Xarelto is >$400 with card. Patient was using a generic discount card but Timothy Lasso was able to find [employer] patient assistance card that should be good through 02/2018. Will bring to [facility] today and call if there are any further  issues.   Tyler Deis. Bonnye Fava, PharmD, BCPS, CPP Clinical Pharmacist Pager: [phone] Phone: [phone] 08/21/2017 2:33 PM

## 2017-08-21 NOTE — Progress Notes (Signed)
Paramedicine Encounter    Patient ID: Bevelyn Bucklesarrell Castello, male    DOB: 17-Sep-1961, 56 y.o.   MRN: [id number]   Patient Care Team: Rollene RotundaHochrein, James, MD as PCP - General (Cardiology) Kaleen MaskElkins, Wilson Oliver, MD (Family Medicine)  Patient Active Problem List   Diagnosis Date Noted  . VT (ventricular tachycardia) (HCC) 05/30/2017  . AKI (acute kidney injury) (HCC)   . Acute on chronic systolic CHF (congestive heart failure) (HCC) 01/29/2017  . HTN (hypertension) 12/25/2016  . Obesity 12/25/2016  . OSA (obstructive sleep apnea) 12/25/2016  . Hyperglycemia 12/25/2016  . Acute respiratory failure with hypoxia (HCC) 12/25/2016  . Chronic anticoagulation 06/27/2016  . Encounter for therapeutic drug monitoring 04/19/2015  . S/P ICD (internal cardiac defibrillator) procedure Medtronic CRT ICD 04/15/2015  . Atrial fibrillation with RVR - Paroxysmal 04/08/2015  . NICM- EF 25% 2012 & 20-25% 2016 04/06/2015  . Troponin level elevated 04/06/2015  . Rectal bleeding 10/26/2011  . Hydrocele, left 05/24/2011  . Tobacco use disorder- 1/2 pk a day   . H/O ETOH abuse 6-8 beers a day   . Obesity 09/27/2008  . CAD- minor CAD 2009 and 2016 09/27/2008  . VENTRICULAR TACHYCARDIA 09/27/2008  . Essential hypertension 06/19/2008  . Chronic systolic heart failure (HCC) 06/19/2008    Current Outpatient Medications:  .  acetaminophen (TYLENOL) 325 MG tablet, Take 2 tablets (650 mg total) by mouth every 4 (four) hours as needed for headache or mild pain., Disp: , Rfl:  .  albuterol (PROVENTIL HFA;VENTOLIN HFA) 108 (90 Base) MCG/ACT inhaler, Inhale 1 puff into the lungs every 6 (six) hours as needed for wheezing or shortness of breath., Disp: 1 Inhaler, Rfl: 1 .  amiodarone (PACERONE) 200 MG tablet, Take 1 tablet (200 mg total) daily by mouth., Disp: 30 tablet, Rfl: 6 .  atorvastatin (LIPITOR) 80 MG tablet, Take 1 tablet (80 mg total) by mouth daily at 6 PM., Disp: 30 tablet, Rfl: 11 .  FLUoxetine (PROZAC) 20 MG tablet,  Take 20 mg by mouth daily., Disp: , Rfl:  .  potassium chloride SA (KLOR-CON M20) 20 MEQ tablet, Take 2 tablets (40 mEq total) by mouth 2 (two) times daily., Disp: 60 tablet, Rfl: 6 .  sacubitril-valsartan (ENTRESTO) 49-51 MG, Take 1 tablet 2 (two) times daily by mouth., Disp: 60 tablet, Rfl: 3 .  sotalol (BETAPACE) 80 MG tablet, Take 1 tablet (80 mg total) by mouth every 12 (twelve) hours., Disp: 60 tablet, Rfl: 6 .  spironolactone (ALDACTONE) 25 MG tablet, TAKE 1 TABLET BY MOUTH ONCE DAILY, Disp: 90 tablet, Rfl: 1 .  torsemide (DEMADEX) 20 MG tablet, Take 3 tablets (60 mg total) by mouth 2 (two) times daily., Disp: 180 tablet, Rfl: 5 .  rivaroxaban (XARELTO) 20 MG TABS tablet, Take 1 tablet (20 mg total) by mouth daily with supper. (Patient not taking: Reported on 08/21/2017), Disp: 30 tablet, Rfl: 0 No Known Allergies    Social History   Socioeconomic History  . Marital status: Divorced    Spouse name: Not on file  . Number of children: 2  . Years of education: Not on file  . Highest education level: Not on file  Social Needs  . Financial resource strain: Not on file  . Food insecurity - worry: Not on file  . Food insecurity - inability: Not on file  . Transportation needs - medical: Not on file  . Transportation needs - non-medical: Not on file  Occupational History  . Occupation: [employer]  . Occupation:  disabled, odd jobs  Tobacco Use  . Smoking status: Former Smoker    Packs/day: 0.50    Years: 4.00    Pack years: 2.00    Types: Cigarettes  . Smokeless tobacco: Never Used  . Tobacco comment: about a pack a week. social  Substance and Sexual Activity  . Alcohol use: Yes    Alcohol/week: 25.2 oz    Types: 42 Cans of beer per week    Comment: 6 beers per day  . Drug use: Yes    Frequency: 7.0 times per week    Types: Codeine, Marijuana    Comment: has discontinued  . Sexual activity: Yes    Partners: Female  Other Topics Concern  . Not on file  Social History  Narrative   ** Merged History Encounter **        Physical Exam  Constitutional: He is oriented to person, place, and time.  Cardiovascular: Normal rate and regular rhythm.  Pulmonary/Chest: Effort normal and breath sounds normal. No respiratory distress. He has no wheezes. He has no rales.  Abdominal: Soft.  Musculoskeletal: Normal range of motion. He exhibits no edema.  Neurological: He is alert and oriented to person, place, and time.  Skin: Skin is warm and dry.  Psychiatric: He has a normal mood and affect.        Future Appointments  Date Time Provider Department Center  09/14/2017 10:30 AM [name], MD [facility]  09/25/2017  9:20 AM [name], MD [facility] None  10/01/2017  9:40 AM [facility] DEVICE REMOTES [facility]    BP 100/62 (BP Location: Left Arm, Patient Position: Sitting, Cuff Size: Large)   Pulse 68   Resp 18   Wt 300 lb (136.1 kg)   SpO2 98%   BMI 36.52 kg/m  Weight yesterday- 300 lb Last visit weight-299 lb  Mr Rothman was seen at home and reported feeling well. He denied SOB, headache, dizziness, or orthopnea. He reports being compliant with his medications but has been out of Xarelto for the past 2 days due to finances. We went through mail and found the correct assistance card which will get him the medication for free. His medications were verified and his pillbox was refilled. He said he would pickup his medicine from the pharmacy later in the day.   Time spent with patient: 55 minutes   Jacqualine Code, EMT 08/21/17  ACTION: Home visit completed Next visit planned for 1 week

## 2017-08-24 ENCOUNTER — Encounter: Payer: Self-pay | Admitting: Internal Medicine

## 2017-09-05 ENCOUNTER — Other Ambulatory Visit (HOSPITAL_COMMUNITY): Payer: Self-pay

## 2017-09-05 MED FILL — SPIRONOLACTONE 25 MG TABLET: 25 | 34 days supply | Qty: 34 | Fill #2

## 2017-09-05 MED FILL — ATORVASTATIN 80 MG TABLET: 80 | 34 days supply | Qty: 34 | Fill #2

## 2017-09-05 NOTE — Progress Notes (Signed)
Paramedicine Encounter    Patient ID: [name], male    DOB: [birth year], 56 y.o.   MRN: [id number]   Patient Care Team: [name], MD as PCP - General (Cardiology) [name], MD (Family Medicine)  Patient Active Problem List   Diagnosis Date Noted  . VT (ventricular tachycardia) (HCC) 05/30/2017  . AKI (acute kidney injury) (HCC)   . Acute on chronic systolic CHF (congestive heart failure) (HCC) 01/29/2017  . HTN (hypertension) 12/25/2016  . Obesity 12/25/2016  . OSA (obstructive sleep apnea) 12/25/2016  . Hyperglycemia 12/25/2016  . Acute respiratory failure with hypoxia (HCC) 12/25/2016  . Chronic anticoagulation 06/27/2016  . Encounter for therapeutic drug monitoring 04/19/2015  . S/P ICD (internal cardiac defibrillator) procedure Medtronic CRT ICD 04/15/2015  . Atrial fibrillation with RVR - Paroxysmal 04/08/2015  . NICM- EF 25% 2012 & 20-25% 2016 04/06/2015  . Troponin level elevated 04/06/2015  . Rectal bleeding 10/26/2011  . Hydrocele, left 05/24/2011  . Tobacco use disorder- 1/2 pk a day   . H/O ETOH abuse 6-8 beers a day   . Obesity 09/27/2008  . CAD- minor CAD 2009 and 2016 09/27/2008  . VENTRICULAR TACHYCARDIA 09/27/2008  . Essential hypertension 06/19/2008  . Chronic systolic heart failure (HCC) 06/19/2008    Current Outpatient Medications:  .  acetaminophen (TYLENOL) 325 MG tablet, Take 2 tablets (650 mg total) by mouth every 4 (four) hours as needed for headache or mild pain., Disp: , Rfl:  .  albuterol (PROVENTIL HFA;VENTOLIN HFA) 108 (90 Base) MCG/ACT inhaler, Inhale 1 puff into the lungs every 6 (six) hours as needed for wheezing or shortness of breath., Disp: 1 Inhaler, Rfl: 1 .  amiodarone (PACERONE) 200 MG tablet, Take 1 tablet (200 mg total) daily by mouth., Disp: 30 tablet, Rfl: 6 .  atorvastatin (LIPITOR) 80 MG tablet, Take 1 tablet (80 mg total) by mouth daily at 6 PM., Disp: 30 tablet, Rfl: 11 .  FLUoxetine (PROZAC) 20 MG tablet,  Take 20 mg by mouth daily., Disp: , Rfl:  .  potassium chloride SA (KLOR-CON M20) 20 MEQ tablet, Take 2 tablets (40 mEq total) by mouth 2 (two) times daily., Disp: 60 tablet, Rfl: 6 .  rivaroxaban (XARELTO) 20 MG TABS tablet, Take 1 tablet (20 mg total) by mouth daily with supper., Disp: 30 tablet, Rfl: 0 .  sacubitril-valsartan (ENTRESTO) 49-51 MG, Take 1 tablet 2 (two) times daily by mouth., Disp: 60 tablet, Rfl: 3 .  sotalol (BETAPACE) 80 MG tablet, Take 1 tablet (80 mg total) by mouth every 12 (twelve) hours., Disp: 60 tablet, Rfl: 6 .  spironolactone (ALDACTONE) 25 MG tablet, TAKE 1 TABLET BY MOUTH ONCE DAILY, Disp: 90 tablet, Rfl: 1 .  torsemide (DEMADEX) 20 MG tablet, Take 3 tablets (60 mg total) by mouth 2 (two) times daily., Disp: 180 tablet, Rfl: 5 No Known Allergies    Social History   Socioeconomic History  . Marital status: Divorced    Spouse name: Not on file  . Number of children: 2  . Years of education: Not on file  . Highest education level: Not on file  Social Needs  . Financial resource strain: Not on file  . Food insecurity - worry: Not on file  . Food insecurity - inability: Not on file  . Transportation needs - medical: Not on file  . Transportation needs - non-medical: Not on file  Occupational History  . Occupation: Copy  . Occupation: disabled, odd jobs  Tobacco Use  .  Smoking status: Former Smoker    Packs/day: 0.50    Years: 4.00    Pack years: 2.00    Types: Cigarettes  . Smokeless tobacco: Never Used  . Tobacco comment: about a pack a week. social  Substance and Sexual Activity  . Alcohol use: Yes    Alcohol/week: 25.2 oz    Types: 42 Cans of beer per week    Comment: 6 beers per day  . Drug use: Yes    Frequency: 7.0 times per week    Types: Codeine, Marijuana    Comment: has discontinued  . Sexual activity: Yes    Partners: Female  Other Topics Concern  . Not on file  Social History Narrative   ** Merged History Encounter **         Physical Exam  Constitutional: He is oriented to person, place, and time.  Cardiovascular: Normal rate and regular rhythm.  Pulmonary/Chest: Effort normal and breath sounds normal. No respiratory distress. He has no wheezes. He has no rales.  Abdominal: Soft.  Musculoskeletal: Normal range of motion. He exhibits no edema.  Neurological: He is alert and oriented to person, place, and time.  Skin: Skin is warm and dry.  Psychiatric: He has a normal mood and affect.        Future Appointments  Date Time Provider Department Center  09/14/2017 10:30 AM Duke SalviaKlein, Steven C, MD [facility]  09/25/2017  9:20 AM Laurey MoraleMcLean, Dalton S, MD [facility] None  10/01/2017  9:40 AM [facility] DEVICE REMOTES [facility]    BP 104/64 (BP Location: Left Arm, Patient Position: Sitting, Cuff Size: Large)   Pulse 62   Resp 16   Wt 294 lb (133.4 kg)   SpO2 97%   BMI 35.79 kg/m   Weight yesterday- 295 lb Last visit weight- 300 lb  Mr Sampley was seen at home today and reported feeling generally well. He denied SOB, dizziness headache or orthopnea. He has been taking his medicine as directed and the necessary medications were ordered from their respective pharmacies. His medications were verified and his pillbox was refilled.   Time spent with patient: 32 minutes  Jacqualine CodeZackery R Arielle Eber, EMT 09/05/17  ACTION: Home visit completed Next visit planned for 1 week

## 2017-09-14 ENCOUNTER — Encounter: Payer: Self-pay | Admitting: Internal Medicine

## 2017-09-14 ENCOUNTER — Ambulatory Visit (INDEPENDENT_AMBULATORY_CARE_PROVIDER_SITE_OTHER): Payer: Self-pay | Admitting: Internal Medicine

## 2017-09-14 VITALS — BP 112/60 | HR 63 | Ht 76.0 in | Wt 299.0 lb

## 2017-09-14 DIAGNOSIS — I472 Ventricular tachycardia, unspecified: Secondary | ICD-10-CM

## 2017-09-14 DIAGNOSIS — Z95810 Presence of automatic (implantable) cardiac defibrillator: Secondary | ICD-10-CM

## 2017-09-14 DIAGNOSIS — I5022 Chronic systolic (congestive) heart failure: Secondary | ICD-10-CM

## 2017-09-14 DIAGNOSIS — I4891 Unspecified atrial fibrillation: Secondary | ICD-10-CM

## 2017-09-14 DIAGNOSIS — I428 Other cardiomyopathies: Secondary | ICD-10-CM

## 2017-09-14 LAB — CUP PACEART INCLINIC DEVICE CHECK
Battery Remaining Longevity: 57 mo
Brady Statistic AP VS Percent: 0.14 %
Brady Statistic AS VP Percent: 55.92 %
Brady Statistic AS VS Percent: 1.58 %
Brady Statistic RV Percent Paced: 97.51 %
HighPow Impedance: 69 Ohm
Implantable Lead Implant Date: 20160727
Implantable Lead Location: 753860
Implantable Lead Model: 5076
Lead Channel Impedance Value: 342 Ohm
Lead Channel Impedance Value: 361 Ohm
Lead Channel Impedance Value: 399 Ohm
Lead Channel Impedance Value: 399 Ohm
Lead Channel Impedance Value: 532 Ohm
Lead Channel Impedance Value: 608 Ohm
Lead Channel Impedance Value: 646 Ohm
Lead Channel Pacing Threshold Amplitude: 1.25 V
Lead Channel Pacing Threshold Pulse Width: 0.4 ms
Lead Channel Pacing Threshold Pulse Width: 0.4 ms
Lead Channel Sensing Intrinsic Amplitude: 1.875 mV
Lead Channel Sensing Intrinsic Amplitude: 4.625 mV
Lead Channel Setting Pacing Amplitude: 1.5 V
Lead Channel Setting Pacing Pulse Width: 0.4 ms
Lead Channel Setting Sensing Sensitivity: 0.3 mV
MDC IDC LEAD IMPLANT DT: 20160727
MDC IDC LEAD IMPLANT DT: 20160727
MDC IDC LEAD LOCATION: 753858
MDC IDC LEAD LOCATION: 753859
MDC IDC MSMT BATTERY VOLTAGE: 2.97 V
MDC IDC MSMT LEADCHNL LV IMPEDANCE VALUE: 342 Ohm
MDC IDC MSMT LEADCHNL LV IMPEDANCE VALUE: 551 Ohm
MDC IDC MSMT LEADCHNL LV IMPEDANCE VALUE: 551 Ohm
MDC IDC MSMT LEADCHNL LV IMPEDANCE VALUE: 608 Ohm
MDC IDC MSMT LEADCHNL LV PACING THRESHOLD PULSEWIDTH: 0.6 ms
MDC IDC MSMT LEADCHNL RA IMPEDANCE VALUE: 513 Ohm
MDC IDC MSMT LEADCHNL RA PACING THRESHOLD AMPLITUDE: 0.5 V
MDC IDC MSMT LEADCHNL RV IMPEDANCE VALUE: 475 Ohm
MDC IDC MSMT LEADCHNL RV PACING THRESHOLD AMPLITUDE: 0.5 V
MDC IDC MSMT LEADCHNL RV SENSING INTR AMPL: 12.375 mV
MDC IDC MSMT LEADCHNL RV SENSING INTR AMPL: 13.375 mV
MDC IDC PG IMPLANT DT: 20160727
MDC IDC SESS DTM: 20181228165911
MDC IDC SET LEADCHNL LV PACING AMPLITUDE: 2.25 V
MDC IDC SET LEADCHNL LV PACING PULSEWIDTH: 0.6 ms
MDC IDC SET LEADCHNL RV PACING AMPLITUDE: 2 V
MDC IDC STAT BRADY AP VP PERCENT: 42.36 %
MDC IDC STAT BRADY RA PERCENT PACED: 42.17 %

## 2017-09-14 LAB — MAGNESIUM: MAGNESIUM: 2.4 mg/dL — AB (ref 1.6–2.3)

## 2017-09-14 NOTE — Progress Notes (Signed)
10 months 3 CRT-D      Patient Care Team: Kaleen MaskElkins, Wilson Oliver, MD as PCP - General (Family Medicine) Kaleen MaskElkins, Wilson Oliver, MD (Family Medicine)   HPI  [name] [name] is a 56 y.o. 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  He also has a history of atrial fibrillation and polymorphic ventricular tachycardia for which amiodarone therapy was initiated. Potassium levels were low at that time   Recurrent ventricular tachycardia 9/18 potassium levels were normal at that time.  Your failed shocks and amiodarone was continued and sotalol was initiated.  He also has hx of Afib in context of alcohol abuse;  he is now down to 5-6 beers a week Records and Results Reviewed   Hospital records   Date Cr K Mg TSH LFTs PFTs  9/18  1.02 3.8 2.3        11/18     0.860 36      He has modest limitations with exertion and fatigue.  He denies chest pain or shortness of breath.  There has been no edema.  He has had no palpitations.  He has had no syncope. Past Medical History:  Diagnosis Date  . CAD (coronary artery disease)    Mild nonobstructive (Cath 09)  . Chronic systolic heart failure (HCC)    Nonischemic CM: echo 4/12 with EF 25-30%, grade 2 diast dysfxn, mild dilated aortic root 43 mm, trivial MR, mod LAE  . COPD (chronic obstructive pulmonary disease) (HCC)   . Dyspnea   . H/O ETOH abuse   . HTN (hypertension)   . Hypersomnia   . Obesity, morbid (HCC)   . OSA on CPAP   . Systolic and diastolic CHF, acute on chronic (HCC) 11/2016  . Tobacco use disorder   . Ventricular tachycardia [facility](HCC)     Past Surgical History:  Procedure Laterality Date  . CARDIAC CATHETERIZATION N/A 04/08/2015   Procedure: Right/Left Heart Cath and Coronary Angiography;  Surgeon: Corky CraftsJayadeep S Varanasi, MD;  [facility];  Service:  Cardiovascular;  Laterality: N/A;  . EP IMPLANTABLE DEVICE N/A 04/14/2015   Procedure: BiV ICD Insertion CRT-D;  Surgeon: Duke SalviaSteven C [name], MD;  [facility];  Service: Cardiovascular;  Laterality: N/A;  . HYDROCELE EXCISION / REPAIR    . TUMOR EXCISION Left   . 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 VASECTOMY      Current Outpatient Medications  Medication Sig Dispense Refill  . acetaminophen (TYLENOL) 325 MG tablet Take 2 tablets (650 mg total) by mouth every 4 (four) hours as needed for headache or mild pain.    Marland Kitchen. albuterol (PROVENTIL HFA;VENTOLIN HFA) 108 (90 Base) MCG/ACT inhaler Inhale 1 puff into the lungs every 6 (six) hours as needed for wheezing or shortness of breath. 1 Inhaler 1  . amiodarone (PACERONE) 200 MG tablet Take 1 tablet (200 mg total) daily by mouth. 30 tablet 6  . atorvastatin (LIPITOR) 80 MG tablet Take 1 tablet (80 mg total) by mouth daily at 6 PM. 30 tablet 11  . FLUoxetine (PROZAC) 20 MG tablet Take 20 mg by mouth daily.    . potassium chloride SA (KLOR-CON M20) 20 MEQ tablet Take 2 tablets (40 mEq total) by mouth 2 (two) times daily. 60 tablet 6  . rivaroxaban (XARELTO) 20 MG TABS tablet Take 1  tablet (20 mg total) by mouth daily with supper. 30 tablet 0  . sacubitril-valsartan (ENTRESTO) 49-51 MG Take 1 tablet 2 (two) times daily by mouth. 60 tablet 3  . sotalol (BETAPACE) 80 MG tablet Take 1 tablet (80 mg total) by mouth every 12 (twelve) hours. 60 tablet 6  . spironolactone (ALDACTONE) 25 MG tablet TAKE 1 TABLET BY MOUTH ONCE DAILY 90 tablet 1  . torsemide (DEMADEX) 20 MG tablet Take 3 tablets (60 mg total) by mouth 2 (two) times daily. 180 tablet 5   No current facility-administered medications for this visit.     No Known Allergies    Review of Systems negative except from HPI and PMH  Physical Exam BP 112/60   Pulse 63   Ht 6\' 4"  (1.93 m)   Wt 299 lb (135.6 kg)   SpO2 98%   BMI 36.40 kg/m  Well developed  and nourished in no acute distress HENT normal Neck supple with JVP-flat Clear Regular rate and rhythm, no murmurs or gallops Abd-soft with active BS No Clubbing cyanosis edema Skin-warm and dry A & Oriented  Grossly normal sensory and motor function   ECG 9/18 demonstrated a QRS that was upright in lead V1 and negative lead I ECG today QRS is QRS in lead I and negative in lead V1 ECG with reprogramming were able to accomplish a negative QRS in lead I and a biphasic RS complex in lead V1 by adding an LV offset of 50 ms    Assessment and  Plan  Nonischemic cardiomyopathy  Congestive heart failure-chronic-systolic  Ventricular tachycardia-polymorphic  PVCs  Atrial fibrillation-paroxysmal  Amiodarone therapy for the above  Sinus node dysfunction with chronotropic incompetence  Ventricular ectopy is markedly diminished  Nonsustained ventricular tachycardia; nonsustained ventricular tachycardia noted  Chronotropic incompetence noted and rate response was activated  As noted above LV offset was activated to electrically reprogrammed to optimize resynchronization  Tolerating his amiodarone and sotalol.  Magnesium for surveillance is indicated.  We spent more than 50% of our >25 min visit in face to face counseling regarding the above

## 2017-09-14 NOTE — Patient Instructions (Addendum)
Medication Instructions: Your physician recommends that you continue on your current medications as directed. Please refer to the Current Medication list given to you today.  Labwork: Your physician has recommended that you have lab work today: Magnesium Panel   Procedures/Testing: None Ordered  Follow-Up: Your physician wants you to follow-up in: 6 MONTHS with Mervyn Gay, NP. You will receive a reminder letter in the mail two months in advance. If you don't receive a letter, please call our office to schedule the follow-up appointment.  Remote monitoring is used to monitor your ICD from home. This monitoring reduces the number of office visits required to check your device to one time per year. It allows [place] to keep an eye on the functioning of your device to ensure it is working properly. You are scheduled for a device check from home on 12/17/17. You may send your transmission at any time that day. If you have a wireless device, the transmission will be sent automatically. After your physician reviews your transmission, you will receive a postcard with your next transmission date.   If you need a refill on your cardiac medications before your next appointment, please call your pharmacy.     If you need a refill on your cardiac medications before your next appointment, please call your pharmacy.

## 2017-09-17 ENCOUNTER — Other Ambulatory Visit (HOSPITAL_COMMUNITY): Payer: Self-pay

## 2017-09-17 ENCOUNTER — Other Ambulatory Visit (HOSPITAL_COMMUNITY): Payer: Self-pay | Admitting: Pharmacist

## 2017-09-17 MED ORDER — POTASSIUM CHLORIDE CRYS ER 20 MEQ PO TBCR: 40.0000 meq | EXTENDED_RELEASE_TABLET | Freq: Two times a day (BID) | ORAL | 6 refills | Status: DC

## 2017-09-17 MED ORDER — TORSEMIDE 20 MG PO TABS: 60.0000 mg | ORAL_TABLET | Freq: Two times a day (BID) | ORAL | 5 refills | Status: DC

## 2017-09-17 NOTE — Progress Notes (Signed)
Paramedicine Encounter    Patient ID: [name] [name], male    DOB: Feb 17, 1961, 56 y.o.   MRN: [id number]   Patient Care Team: [name], MD as PCP - General (Family Medicine) [name], MD (Family Medicine)  Patient Active Problem List   Diagnosis Date Noted  . VT (ventricular tachycardia) (HCC) 05/30/2017  . AKI (acute kidney injury) (HCC)   . Acute on chronic systolic CHF (congestive heart failure) (HCC) 01/29/2017  . HTN (hypertension) 12/25/2016  . Obesity 12/25/2016  . OSA (obstructive sleep apnea) 12/25/2016  . Hyperglycemia 12/25/2016  . Acute respiratory failure with hypoxia (HCC) 12/25/2016  . Chronic anticoagulation 06/27/2016  . Encounter for therapeutic drug monitoring 04/19/2015  . S/P ICD (internal cardiac defibrillator) procedure Medtronic CRT ICD 04/15/2015  . Atrial fibrillation with RVR - Paroxysmal 04/08/2015  . NICM- EF 25% 2012 & 20-25% 2016 04/06/2015  . Troponin level elevated 04/06/2015  . Rectal bleeding 10/26/2011  . Hydrocele, left 05/24/2011  . Tobacco use disorder- 1/2 pk a day   . H/O ETOH abuse 6-8 beers a day   . Obesity 09/27/2008  . CAD- minor CAD 2009 and 2016 09/27/2008  . VENTRICULAR TACHYCARDIA 09/27/2008  . Essential hypertension 06/19/2008  . Chronic systolic heart failure (HCC) 06/19/2008    Current Outpatient Medications:  .  acetaminophen (TYLENOL) 325 MG tablet, Take 2 tablets (650 mg total) by mouth every 4 (four) hours as needed for headache or mild pain., Disp: , Rfl:  .  albuterol (PROVENTIL HFA;VENTOLIN HFA) 108 (90 Base) MCG/ACT inhaler, Inhale 1 puff into the lungs every 6 (six) hours as needed for wheezing or shortness of breath., Disp: 1 Inhaler, Rfl: 1 .  amiodarone (PACERONE) 200 MG tablet, Take 1 tablet (200 mg total) daily by mouth., Disp: 30 tablet, Rfl: 6 .  atorvastatin (LIPITOR) 80 MG tablet, Take 1 tablet (80 mg total) by mouth daily at 6 PM., Disp: 30 tablet, Rfl: 11 .  FLUoxetine (PROZAC) 20  MG tablet, Take 20 mg by mouth daily., Disp: , Rfl:  .  rivaroxaban (XARELTO) 20 MG TABS tablet, Take 1 tablet (20 mg total) by mouth daily with supper., Disp: 30 tablet, Rfl: 0 .  sacubitril-valsartan (ENTRESTO) 49-51 MG, Take 1 tablet 2 (two) times daily by mouth., Disp: 60 tablet, Rfl: 3 .  sotalol (BETAPACE) 80 MG tablet, Take 1 tablet (80 mg total) by mouth every 12 (twelve) hours., Disp: 60 tablet, Rfl: 6 .  spironolactone (ALDACTONE) 25 MG tablet, TAKE 1 TABLET BY MOUTH ONCE DAILY, Disp: 90 tablet, Rfl: 1 .  potassium chloride SA (KLOR-CON M20) 20 MEQ tablet, Take 2 tablets (40 mEq total) by mouth 2 (two) times daily., Disp: 120 tablet, Rfl: 6 .  torsemide (DEMADEX) 20 MG tablet, Take 3 tablets (60 mg total) by mouth 2 (two) times daily., Disp: 180 tablet, Rfl: 5 No Known Allergies    Social History   Socioeconomic History  . Marital status: Divorced    Spouse name: Not on file  . Number of children: 2  . Years of education: Not on file  . Highest education level: Not on file  Social Needs  . Financial resource strain: Not on file  . Food insecurity - worry: Not on file  . Food insecurity - inability: Not on file  . Transportation needs - medical: Not on file  . Transportation needs - non-medical: Not on file  Occupational History  . Occupation: Copy  . Occupation: disabled, odd jobs  Tobacco Use  . Smoking status: Former Smoker    Packs/day: 0.50    Years: 4.00    Pack years: 2.00    Types: Cigarettes  . Smokeless tobacco: Never Used  . Tobacco comment: about a pack a week. social  Substance and Sexual Activity  . Alcohol use: Yes    Alcohol/week: 25.2 oz    Types: 42 Cans of beer per week    Comment: 6 beers per day  . Drug use: Yes    Frequency: 7.0 times per week    Types: Codeine, Marijuana    Comment: has discontinued  . Sexual activity: Yes    Partners: Female  Other Topics Concern  . Not on file  Social History Narrative   ** Merged History  Encounter **        Physical Exam  Constitutional: He is oriented to person, place, and time.  Cardiovascular: Normal rate and regular rhythm.  Pulmonary/Chest: Effort normal and breath sounds normal. No respiratory distress. He has no wheezes. He has no rales.  Abdominal: Soft.  Musculoskeletal: Normal range of motion. He exhibits no edema.  Neurological: He is alert and oriented to person, place, and time.  Skin: Skin is warm and dry.  Psychiatric: He has a normal mood and affect.        Future Appointments  Date Time Provider Department Center  09/25/2017  9:20 AM Laurey MoraleMcLean, Dalton S, MD [facility] None  12/17/2017  7:10 AM [facility] DEVICE REMOTES [facility]    BP 90/60 (BP Location: Left Arm, Patient Position: Sitting, Cuff Size: Large)   Pulse 78   Resp 16   Wt 293 lb 3.2 oz (133 kg)   SpO2 98%   BMI 35.69 kg/m   Weight yesterday- Did not weigh Last visit weight- 294 lb  Mr Romberg was seen at home today and reported feeling well. He denied SOB, dizziness, headache or orthopnea. He has been compliant with his medications. He was out of potassium and torsemide so I [facility] Cicero Duckrika [facility] His medications were verified and his pillbox was refilled.   Time spent with patient: 33 minutes  Jacqualine CodeZackery R Joselynne Killam, EMT 09/17/17  ACTION: Home visit completed Next visit planned for 2 week

## 2017-09-19 ENCOUNTER — Telehealth: Payer: Self-pay

## 2017-09-19 NOTE — Telephone Encounter (Signed)
Pt is aware and agreeable to normal results  

## 2017-09-24 ENCOUNTER — Telehealth: Payer: Self-pay

## 2017-09-24 NOTE — Telephone Encounter (Signed)
Referred to ICM by Miquel Dunn, Device RN and approved by Dr Graciela Husbands.  Attempted ICM intro call to patient and left message to return call.

## 2017-09-25 ENCOUNTER — Ambulatory Visit (HOSPITAL_COMMUNITY): Payer: Medicare Other

## 2017-09-25 ENCOUNTER — Other Ambulatory Visit (HOSPITAL_COMMUNITY): Payer: Self-pay

## 2017-09-25 ENCOUNTER — Other Ambulatory Visit: Payer: Self-pay

## 2017-09-25 ENCOUNTER — Ambulatory Visit (HOSPITAL_COMMUNITY)
Admission: RE | Admit: 2017-09-25 | Discharge: 2017-09-25 | Disposition: A | Discharge status: Home / Self Care | Payer: Medicare Other | Source: Ambulatory Visit | Attending: Cardiology | Admitting: Cardiology

## 2017-09-25 VITALS — BP 102/59 | HR 87 | Wt 299.8 lb

## 2017-09-25 DIAGNOSIS — R9431 Abnormal electrocardiogram [ECG] [EKG]: Secondary | ICD-10-CM | POA: Diagnosis not present

## 2017-09-25 DIAGNOSIS — J449 Chronic obstructive pulmonary disease, unspecified: Secondary | ICD-10-CM | POA: Diagnosis not present

## 2017-09-25 DIAGNOSIS — G4733 Obstructive sleep apnea (adult) (pediatric): Secondary | ICD-10-CM | POA: Diagnosis not present

## 2017-09-25 DIAGNOSIS — Z87891 Personal history of nicotine dependence: Secondary | ICD-10-CM | POA: Insufficient documentation

## 2017-09-25 DIAGNOSIS — I110 Hypertensive heart disease with heart failure: Secondary | ICD-10-CM | POA: Insufficient documentation

## 2017-09-25 DIAGNOSIS — I4891 Unspecified atrial fibrillation: Secondary | ICD-10-CM

## 2017-09-25 DIAGNOSIS — I472 Ventricular tachycardia, unspecified: Secondary | ICD-10-CM

## 2017-09-25 DIAGNOSIS — Z95810 Presence of automatic (implantable) cardiac defibrillator: Secondary | ICD-10-CM | POA: Diagnosis not present

## 2017-09-25 DIAGNOSIS — Z8249 Family history of ischemic heart disease and other diseases of the circulatory system: Secondary | ICD-10-CM | POA: Diagnosis not present

## 2017-09-25 DIAGNOSIS — Z79899 Other long term (current) drug therapy: Secondary | ICD-10-CM | POA: Insufficient documentation

## 2017-09-25 DIAGNOSIS — Z7901 Long term (current) use of anticoagulants: Secondary | ICD-10-CM | POA: Diagnosis not present

## 2017-09-25 DIAGNOSIS — I480 Paroxysmal atrial fibrillation: Secondary | ICD-10-CM | POA: Diagnosis not present

## 2017-09-25 DIAGNOSIS — I429 Cardiomyopathy, unspecified: Secondary | ICD-10-CM | POA: Diagnosis not present

## 2017-09-25 DIAGNOSIS — E785 Hyperlipidemia, unspecified: Secondary | ICD-10-CM | POA: Diagnosis not present

## 2017-09-25 DIAGNOSIS — I5022 Chronic systolic (congestive) heart failure: Secondary | ICD-10-CM | POA: Insufficient documentation

## 2017-09-25 LAB — COMPREHENSIVE METABOLIC PANEL
ALT: 26 U/L (ref 17–63)
ANION GAP: 7 (ref 5–15)
AST: 26 U/L (ref 15–41)
Albumin: 3.7 g/dL (ref 3.5–5.0)
Alkaline Phosphatase: 84 U/L (ref 38–126)
BILIRUBIN TOTAL: 0.9 mg/dL (ref 0.3–1.2)
BUN: 22 mg/dL — ABNORMAL HIGH (ref 6–20)
CALCIUM: 9 mg/dL (ref 8.9–10.3)
CO2: 28 mmol/L (ref 22–32)
Chloride: 102 mmol/L (ref 101–111)
Creatinine, Ser: 1.23 mg/dL (ref 0.61–1.24)
Glucose, Bld: 177 mg/dL — ABNORMAL HIGH (ref 65–99)
Potassium: 4.2 mmol/L (ref 3.5–5.1)
Sodium: 137 mmol/L (ref 135–145)
TOTAL PROTEIN: 6.3 g/dL — AB (ref 6.5–8.1)

## 2017-09-25 LAB — CBC
HEMATOCRIT: 32.4 % — AB (ref 39.0–52.0)
Hemoglobin: 10.8 g/dL — ABNORMAL LOW (ref 13.0–17.0)
MCH: 32.3 pg (ref 26.0–34.0)
MCHC: 33.3 g/dL (ref 30.0–36.0)
MCV: 97 fL (ref 78.0–100.0)
Platelets: 165 10*3/uL (ref 150–400)
RBC: 3.34 MIL/uL — ABNORMAL LOW (ref 4.22–5.81)
RDW: 13.1 % (ref 11.5–15.5)
WBC: 3.4 10*3/uL — ABNORMAL LOW (ref 4.0–10.5)

## 2017-09-25 LAB — LIPID PANEL
Cholesterol: 100 mg/dL (ref 0–200)
HDL: 44 mg/dL (ref >40)
LDL Cholesterol: 42 mg/dL (ref 0–99)
TRIGLYCERIDES: 68 mg/dL (ref <150)
Total CHOL/HDL Ratio: 2.3 RATIO
VLDL: 14 mg/dL (ref 0–40)

## 2017-09-25 NOTE — Progress Notes (Signed)
Paramedicine Encounter   Patient ID: [name] [name] , male,   DOB: [birth year],56 y.o.,  MRN: [id number]   [name] [name] was seen in the clinic today. He reported feeling well. Per clinic staff, no changes were necessary at this time.   Time spent with patient  10 minutes   Jacqualine Code, EMT 09/25/17    ACTION: Next visit planned for 1 week

## 2017-09-25 NOTE — Patient Instructions (Signed)
Labs drawn today (if we do not call you, then your lab work was stable)   Your physician recommends that you schedule a follow-up appointment in: 3 months with Dr. [name]  (we will call you)    

## 2017-09-25 NOTE — Progress Notes (Signed)
Advanced Heart Failure Clinic Note   Primary Care: Dr. Windle Guard, MD HF Cardiology: Dr. Shirlee Latch  HPI: [name] is a 57 year old male with a past medical history of chronic systolic CHF (last EF 15% in April 2018), dilated NICM s/p BiV ICD, PAF with intermittent compliance with Xarelto, ETOH abuse, tobacco abuse, OSA on CPAP, and HTN.   Admitted 12/25/16-12/27/16 with acute on chronic systolic CHF, volume overload. He was diuresed 11 pounds on IV lasix.  Discharge weight was 294 pounds. It was felt that the etiology of his NICM was ETOH vs hypertension related.   Admitted 5/14-5/16/18 with volume overload, acute respiratory failure requiring Bipap. He was not taking his Entresto as he was confused about dosing and there was a mix up his pharmacy. He was diuresed with IV lasix, discharge weight was 295 pounds.   Admitted 05/30/17-06/02/17 with VT, ICD shock x 4. He is now on amiodarone and sotalol.  He returns today for followup of CHF.  Generally doing very well.  Taking all meds.  Able to do all ADLs.  Notes dyspnea with prolonged heavy activity like splitting wood.  Much improved compared to a year ago.  No orthopnea/PND.  No chest pain.  No lightheadedness.  Using CPAP every night.  Weight down 6 lbs.  Now drinking about 6 beers/week.   ECG (personally reviewed): NSR, BiV paced with QTc 533 msec  Medtronic device interrogation: No atrial fibrillation or VT.  Fluid index < threshold with stable impedance.   Labs (9/18): K 3.8, creatinine 1.02 Labs (11/18): K 4.4, creatinine 1.19, TSH normal, LFTs normal  PMH: 1. Chronic systolic CHF: Medtronic CRT-D.  Nonischemic cardiomyopathy. Possibly ETOH-related.  - LHC (2016): No CAD.  - Echo (2016): EF 20-25%, diffuse hypokinesis.  - Echo (4/18): EF 15%, normal RV size and systolic function.  - Echo (11/18): EF 20%, mildly dilated LV with mild LVH, normal RV size with mildly decreased RV systolic function.  2. Atrial fibrillation: Paroxysmal.     3. OSA: On CPAP.  4. ETOH abuse: Prior heavy ETOH.  5. H/o VT 6. COPD: Prior smoker.   ROS: All systems reviewed and negative except as per HPI.   Current Outpatient Medications  Medication Sig Dispense Refill  . acetaminophen (TYLENOL) 325 MG tablet Take 2 tablets (650 mg total) by mouth every 4 (four) hours as needed for headache or mild pain.    Marland Kitchen albuterol (PROVENTIL HFA;VENTOLIN HFA) 108 (90 Base) MCG/ACT inhaler Inhale 1 puff into the lungs every 6 (six) hours as needed for wheezing or shortness of breath. 1 Inhaler 1  . amiodarone (PACERONE) 200 MG tablet Take 1 tablet (200 mg total) daily by mouth. 30 tablet 6  . atorvastatin (LIPITOR) 80 MG tablet Take 1 tablet (80 mg total) by mouth daily at 6 PM. 30 tablet 11  . FLUoxetine (PROZAC) 20 MG tablet Take 20 mg by mouth daily.    . potassium chloride SA (KLOR-CON M20) 20 MEQ tablet Take 2 tablets (40 mEq total) by mouth 2 (two) times daily. 120 tablet 6  . rivaroxaban (XARELTO) 20 MG TABS tablet Take 1 tablet (20 mg total) by mouth daily with supper. 30 tablet 0  . sacubitril-valsartan (ENTRESTO) 49-51 MG Take 1 tablet 2 (two) times daily by mouth. 60 tablet 3  . sotalol (BETAPACE) 80 MG tablet Take 1 tablet (80 mg total) by mouth every 12 (twelve) hours. 60 tablet 6  . spironolactone (ALDACTONE) 25 MG tablet TAKE 1 TABLET BY  MOUTH ONCE DAILY 90 tablet 1  . torsemide (DEMADEX) 20 MG tablet Take 3 tablets (60 mg total) by mouth 2 (two) times daily. 180 tablet 5   No current facility-administered medications for this encounter.     No Known Allergies    Social History   Socioeconomic History  . Marital status: Divorced    Spouse name: Not on file  . Number of children: 2  . Years of education: Not on file  . Highest education level: Not on file  Social Needs  . Financial resource strain: Not on file  . Food insecurity - worry: Not on file  . Food insecurity - inability: Not on file  . Transportation needs - medical: Not on  file  . Transportation needs - non-medical: Not on file  Occupational History  . Occupation: Copy  . Occupation: disabled, odd jobs  Tobacco Use  . Smoking status: Former Smoker    Packs/day: 0.50    Years: 4.00    Pack years: 2.00    Types: Cigarettes  . Smokeless tobacco: Never Used  . Tobacco comment: about a pack a week. social  Substance and Sexual Activity  . Alcohol use: Yes    Alcohol/week: 25.2 oz    Types: 42 Cans of beer per week    Comment: 6 beers per day  . Drug use: Yes    Frequency: 7.0 times per week    Types: Codeine, Marijuana    Comment: has discontinued  . Sexual activity: Yes    Partners: Female  Other Topics Concern  . Not on file  Social History Narrative   ** Merged History Encounter **          Family History  Problem Relation Age of Onset  . Other Mother        cardiac surgery. late 1990s  . Heart Problems Mother        CABG AGE 43  . Congestive Heart Failure Father   . Healthy Father        AGE 6  . Coronary artery disease Unknown   . Healthy Brother        AGE 30  . Healthy Sister        AGE 60  . Healthy Sister        AGE 31  . Healthy Son   . Healthy Son   . Healthy Daughter     Vitals:   09/25/17 0933  BP: (!) 102/59  Pulse: 87  SpO2: 99%  Weight: 299 lb 12 oz (136 kg)   Wt Readings from Last 3 Encounters:  09/25/17 299 lb 12 oz (136 kg)  09/17/17 293 lb 3.2 oz (133 kg)  09/14/17 299 lb (135.6 kg)     PHYSICAL EXAM: General: NAD Neck: Thick, no JVD, no thyromegaly or thyroid nodule.  Lungs: Clear to auscultation bilaterally with normal respiratory effort. CV: Nondisplaced PMI.  Heart regular S1/S2, no S3/S4, no murmur.  No peripheral edema.  No carotid bruit.  Normal pedal pulses.  Abdomen: Soft, nontender, no hepatosplenomegaly, no distention.  Skin: Intact without lesions or rashes.  Neurologic: Alert and oriented x 3.  Psych: Normal affect. Extremities: No clubbing or cyanosis.  HEENT: Normal.    ASSESSMENT & PLAN: 1. Chronic systolic CHF: NICM, Echo 12/2016 EF 15%. Normal cors in July 2016. Medtronic CRT-D. ETOH vs. Hypertension.  NYHA class II symptoms.  He does not appear volume overloaded by exam or by Optivol.  - Continue torsemide 60  mg BID. BMET today.  - Continue KCl BID - With soft BP today, will not increase Entresto.  At next appointment, will try to increase Entresto to 97/103 bid.  - Continue Spiro 25 mg daily - Intolerant to Coreg in the past with hypotension, he is on sotalol and amiodarone.  2. PAF: NSR today on amiodarone. No recent atrial fibrillation on device interrogation. - Continue amiodarone 200 mg daily.  Needs LFTs/TSH today.  Should have regular eye exam.   - Continue Xarelto.  3. ETOH abuse: Suspect this contributes to cardiomyopathy.  He has cut back, I recommended that he continue to cut back to minimal/none => down to about 6 beers/week which is better (was drinking > 1 case/week). 4. Tobacco abuse: Rare cigarettes now.   5. VT: In 9/18.  No further on device interrogation.  He is now on both sotalol (to lower DFTs) and amiodarone. QTc is 533 msec paced (want < 550 msec paced).  6. OSA: Compliant with CPAP hs.   Followup 3 mos.   Marca Ancona, MD 09/25/17

## 2017-09-26 ENCOUNTER — Encounter (HOSPITAL_COMMUNITY): Payer: Self-pay

## 2017-10-09 ENCOUNTER — Telehealth (HOSPITAL_COMMUNITY): Payer: Self-pay

## 2017-10-09 NOTE — Telephone Encounter (Signed)
I called [name] [name] to schedule an appointment. He stated he would be able to meet tomorrow morning at 09:00.

## 2017-10-10 ENCOUNTER — Encounter (HOSPITAL_COMMUNITY): Payer: Self-pay

## 2017-10-10 ENCOUNTER — Other Ambulatory Visit (HOSPITAL_COMMUNITY): Payer: Self-pay | Admitting: Cardiology

## 2017-10-10 ENCOUNTER — Other Ambulatory Visit (HOSPITAL_COMMUNITY): Payer: Self-pay

## 2017-10-10 MED ORDER — SPIRONOLACTONE 25 MG PO TABS: 25.0000 mg | ORAL_TABLET | Freq: Every day | ORAL | 5 refills | Status: DC

## 2017-10-10 MED ORDER — ATORVASTATIN CALCIUM 80 MG PO TABS: 80.0000 mg | ORAL_TABLET | Freq: Every day | ORAL | 11 refills | Status: DC

## 2017-10-10 NOTE — Progress Notes (Signed)
Paramedicine Encounter    Patient ID: [name] [name], male    DOB: 07-04-1961, 57 y.o.   MRN: [id number]   Patient Care Team: [name] Mask, MD as PCP - General (Family Medicine) [name] Mask, MD (Family Medicine)  Patient Active Problem List   Diagnosis Date Noted  . VT (ventricular tachycardia) (HCC) 05/30/2017  . AKI (acute kidney injury) (HCC)   . Acute on chronic systolic CHF (congestive heart failure) (HCC) 01/29/2017  . HTN (hypertension) 12/25/2016  . Obesity 12/25/2016  . OSA (obstructive sleep apnea) 12/25/2016  . Hyperglycemia 12/25/2016  . Acute respiratory failure with hypoxia (HCC) 12/25/2016  . Chronic anticoagulation 06/27/2016  . Encounter for therapeutic drug monitoring 04/19/2015  . S/P ICD (internal cardiac defibrillator) procedure Medtronic CRT ICD 04/15/2015  . Atrial fibrillation with RVR - Paroxysmal 04/08/2015  . NICM- EF 25% 2012 & 20-25% 2016 04/06/2015  . Troponin level elevated 04/06/2015  . Rectal bleeding 10/26/2011  . Hydrocele, left 05/24/2011  . Tobacco use disorder- 1/2 pk a day   . H/O ETOH abuse 6-8 beers a day   . Obesity 09/27/2008  . CAD- minor CAD 2009 and 2016 09/27/2008  . VENTRICULAR TACHYCARDIA 09/27/2008  . Essential hypertension 06/19/2008  . Chronic systolic heart failure (HCC) 06/19/2008    Current Outpatient Medications:  .  acetaminophen (TYLENOL) 325 MG tablet, Take 2 tablets (650 mg total) by mouth every 4 (four) hours as needed for headache or mild pain., Disp: , Rfl:  .  albuterol (PROVENTIL HFA;VENTOLIN HFA) 108 (90 Base) MCG/ACT inhaler, Inhale 1 puff into the lungs every 6 (six) hours as needed for wheezing or shortness of breath., Disp: 1 Inhaler, Rfl: 1 .  amiodarone (PACERONE) 200 MG tablet, Take 1 tablet (200 mg total) daily by mouth., Disp: 30 tablet, Rfl: 6 .  FLUoxetine (PROZAC) 20 MG tablet, Take 20 mg by mouth daily., Disp: , Rfl:  .  potassium chloride SA (KLOR-CON M20) 20 MEQ tablet, Take 2  tablets (40 mEq total) by mouth 2 (two) times daily., Disp: 120 tablet, Rfl: 6 .  sacubitril-valsartan (ENTRESTO) 49-51 MG, Take 1 tablet 2 (two) times daily by mouth., Disp: 60 tablet, Rfl: 3 .  sotalol (BETAPACE) 80 MG tablet, Take 1 tablet (80 mg total) by mouth every 12 (twelve) hours., Disp: 60 tablet, Rfl: 6 .  torsemide (DEMADEX) 20 MG tablet, Take 3 tablets (60 mg total) by mouth 2 (two) times daily., Disp: 180 tablet, Rfl: 5 .  atorvastatin (LIPITOR) 80 MG tablet, Take 1 tablet (80 mg total) by mouth daily at 6 PM., Disp: 30 tablet, Rfl: 11 .  rivaroxaban (XARELTO) 20 MG TABS tablet, Take 1 tablet (20 mg total) by mouth daily., Disp: 60 tablet, Rfl: 5 .  spironolactone (ALDACTONE) 25 MG tablet, Take 1 tablet (25 mg total) by mouth daily., Disp: 30 tablet, Rfl: 5 No Known Allergies    Social History   Socioeconomic History  . Marital status: Divorced    Spouse name: Not on file  . Number of children: 2  . Years of education: Not on file  . Highest education level: Not on file  Social Needs  . Financial resource strain: Not on file  . Food insecurity - worry: Not on file  . Food insecurity - inability: Not on file  . Transportation needs - medical: Not on file  . Transportation needs - non-medical: Not on file  Occupational History  . Occupation: Copy  . Occupation: disabled, odd jobs  Tobacco Use  . Smoking status: Former Smoker    Packs/day: 0.50    Years: 4.00    Pack years: 2.00    Types: Cigarettes  . Smokeless tobacco: Never Used  . Tobacco comment: about a pack a week. social  Substance and Sexual Activity  . Alcohol use: Yes    Alcohol/week: 25.2 oz    Types: 42 Cans of beer per week    Comment: 6 beers per day  . Drug use: Yes    Frequency: 7.0 times per week    Types: Codeine, Marijuana    Comment: has discontinued  . Sexual activity: Yes    Partners: Female  Other Topics Concern  . Not on file  Social History Narrative   ** Merged History  Encounter **        Physical Exam  Constitutional: He is oriented to person, place, and time.  Neck: No JVD present.  Cardiovascular: Normal rate and regular rhythm.  Pulmonary/Chest: Effort normal and breath sounds normal. No respiratory distress. He has no wheezes. He has no rales.  Abdominal: Soft. He exhibits no distension.  Musculoskeletal: Normal range of motion. He exhibits no edema.  Neurological: He is alert and oriented to person, place, and time.  Skin: Skin is warm and dry.  Psychiatric: He has a normal mood and affect.        Future Appointments  Date Time Provider Department Center  12/17/2017  7:10 AM [facility] DEVICE REMOTES [facility]    BP 90/64 (BP Location: Left Arm, Patient Position: Sitting, Cuff Size: Large)   Pulse 69   Resp 17   Wt 295 lb (133.8 kg)   SpO2 97%   BMI 35.91 kg/m   Weight yesterday- 297 lb Last visit weight- 299 lb  [name] was seen at home and reported feeling well. He denied SOB, headache, dizziness or orthopnea. He stated he has been compliant with his medications. No changes were necessary today. Torsemide, xarelto, atorvastatin and spironolactone were ordered and will be ready for pickup in the next day or two. He also ordered Entresto from [employer] patient assist and should be delivered in a few days.   Time spent with patient: 28 minutes  [name] [name], [name] [name] 10/10/17  ACTION: Home visit completed Next visit planned for 2 weeks

## 2017-10-12 NOTE — Telephone Encounter (Signed)
Spoke with patient and ICM intro given.  He agreed to monthly ICM follow up.  1st ICM transmission scheduled for 11/12/17.  His monitor is by his bedside.  Provided ICM direct number and encouraged to call for fluid symptoms.

## 2017-10-18 ENCOUNTER — Other Ambulatory Visit (HOSPITAL_COMMUNITY): Payer: Self-pay

## 2017-10-18 NOTE — Progress Notes (Signed)
Paramedicine Encounter    Patient ID: [name] [name], male    DOB: 1961/03/22, 57 y.o.   MRN: [id number]   Patient Care Team: [name], MD as PCP - General (Family Medicine) [name], MD (Family Medicine)  Patient Active Problem List   Diagnosis Date Noted  . VT (ventricular tachycardia) (HCC) 05/30/2017  . AKI (acute kidney injury) (HCC)   . Acute on chronic systolic CHF (congestive heart failure) (HCC) 01/29/2017  . HTN (hypertension) 12/25/2016  . Obesity 12/25/2016  . OSA (obstructive sleep apnea) 12/25/2016  . Hyperglycemia 12/25/2016  . Acute respiratory failure with hypoxia (HCC) 12/25/2016  . Chronic anticoagulation 06/27/2016  . Encounter for therapeutic drug monitoring 04/19/2015  . S/P ICD (internal cardiac defibrillator) procedure Medtronic CRT ICD 04/15/2015  . Atrial fibrillation with RVR - Paroxysmal 04/08/2015  . NICM- EF 25% 2012 & 20-25% 2016 04/06/2015  . Troponin level elevated 04/06/2015  . Rectal bleeding 10/26/2011  . Hydrocele, left 05/24/2011  . Tobacco use disorder- 1/2 pk a day   . H/O ETOH abuse 6-8 beers a day   . Obesity 09/27/2008  . CAD- minor CAD 2009 and 2016 09/27/2008  . VENTRICULAR TACHYCARDIA 09/27/2008  . Essential hypertension 06/19/2008  . Chronic systolic heart failure (HCC) 06/19/2008    Current Outpatient Medications:  .  acetaminophen (TYLENOL) 325 MG tablet, Take 2 tablets (650 mg total) by mouth every 4 (four) hours as needed for headache or mild pain., Disp: , Rfl:  .  albuterol (PROVENTIL HFA;VENTOLIN HFA) 108 (90 Base) MCG/ACT inhaler, Inhale 1 puff into the lungs every 6 (six) hours as needed for wheezing or shortness of breath., Disp: 1 Inhaler, Rfl: 1 .  amiodarone (PACERONE) 200 MG tablet, Take 1 tablet (200 mg total) daily by mouth., Disp: 30 tablet, Rfl: 6 .  atorvastatin (LIPITOR) 80 MG tablet, Take 1 tablet (80 mg total) by mouth daily at 6 PM., Disp: 30 tablet, Rfl: 11 .  FLUoxetine (PROZAC) 20  MG tablet, Take 20 mg by mouth daily., Disp: , Rfl:  .  potassium chloride SA (KLOR-CON M20) 20 MEQ tablet, Take 2 tablets (40 mEq total) by mouth 2 (two) times daily., Disp: 120 tablet, Rfl: 6 .  rivaroxaban (XARELTO) 20 MG TABS tablet, Take 1 tablet (20 mg total) by mouth daily., Disp: 60 tablet, Rfl: 5 .  sacubitril-valsartan (ENTRESTO) 49-51 MG, Take 1 tablet 2 (two) times daily by mouth., Disp: 60 tablet, Rfl: 3 .  sotalol (BETAPACE) 80 MG tablet, Take 1 tablet (80 mg total) by mouth every 12 (twelve) hours., Disp: 60 tablet, Rfl: 6 .  spironolactone (ALDACTONE) 25 MG tablet, Take 1 tablet (25 mg total) by mouth daily., Disp: 30 tablet, Rfl: 5 .  torsemide (DEMADEX) 20 MG tablet, Take 3 tablets (60 mg total) by mouth 2 (two) times daily., Disp: 180 tablet, Rfl: 5 No Known Allergies    Social History   Socioeconomic History  . Marital status: Divorced    Spouse name: Not on file  . Number of children: 2  . Years of education: Not on file  . Highest education level: Not on file  Social Needs  . Financial resource strain: Not on file  . Food insecurity - worry: Not on file  . Food insecurity - inability: Not on file  . Transportation needs - medical: Not on file  . Transportation needs - non-medical: Not on file  Occupational History  . Occupation: Copy  . Occupation: disabled, odd jobs  Tobacco Use  . Smoking status: Former Smoker    Packs/day: 0.50    Years: 4.00    Pack years: 2.00    Types: Cigarettes  . Smokeless tobacco: Never Used  . Tobacco comment: about a pack a week. social  Substance and Sexual Activity  . Alcohol use: Yes    Alcohol/week: 25.2 oz    Types: 42 Cans of beer per week    Comment: 6 beers per day  . Drug use: Yes    Frequency: 7.0 times per week    Types: Codeine, Marijuana    Comment: has discontinued  . Sexual activity: Yes    Partners: Female  Other Topics Concern  . Not on file  Social History Narrative   ** Merged History  Encounter **        Physical Exam  Constitutional: He is oriented to person, place, and time.  Cardiovascular: Normal rate and regular rhythm.  Pulmonary/Chest: Effort normal and breath sounds normal. No respiratory distress. He has no wheezes. He has no rales.  Abdominal: Soft.  Musculoskeletal: Normal range of motion. He exhibits no edema.  Neurological: He is alert and oriented to person, place, and time.  Skin: Skin is warm and dry.  Psychiatric: He has a normal mood and affect.        Future Appointments  Date Time Provider Department Center  11/12/2017  3:25 PM [facility]  12/17/2017  7:10 AM [facility] DEVICE [facility]OTES [facility]    BP 118/78 (BP Location: Left Arm, Patient Position: Sitting, Cuff Size: Large)   Pulse 78   Resp 16   Wt 296 lb (134.3 kg)   SpO2 98%   BMI 36.03 kg/m   Weight yesterday- 295 lb Last visit weight- 295 lb  [name] [name] was seen at home today and reported feeling well. He denied SOB, headache, dizziness, or orthopnea. He reported being compliant with his medications and he had filled his own pillbox prior to me arriving. He reported a moderate level of activity without SOB but reports resting frequently to avoid issues. He continues to monitor and record his weight daily. I believe [name] [name] is nearing completion of the [facility] program and I will discuss this with him at our next visit.   Time spent with patient: 43 minutes  Jacqualine Code, EMT 10/18/17  ACTION: Home visit completed Next visit planned for 2 weeks

## 2017-10-22 ENCOUNTER — Other Ambulatory Visit: Payer: Self-pay | Admitting: Cardiology

## 2017-10-30 ENCOUNTER — Other Ambulatory Visit (HOSPITAL_COMMUNITY): Payer: Self-pay

## 2017-10-30 NOTE — Progress Notes (Signed)
Paramedicine Encounter    Patient ID: [name] [name], male    DOB: 01/14/1961, 57 y.o.   MRN: [id number]   Patient Care Team: [name] Mask, MD as PCP - General (Family Medicine) [name] Mask, MD (Family Medicine)  Patient Active Problem List   Diagnosis Date Noted  . VT (ventricular tachycardia) (HCC) 05/30/2017  . AKI (acute kidney injury) (HCC)   . Acute on chronic systolic CHF (congestive heart failure) (HCC) 01/29/2017  . HTN (hypertension) 12/25/2016  . Obesity 12/25/2016  . OSA (obstructive sleep apnea) 12/25/2016  . Hyperglycemia 12/25/2016  . Acute respiratory failure with hypoxia (HCC) 12/25/2016  . Chronic anticoagulation 06/27/2016  . Encounter for therapeutic drug monitoring 04/19/2015  . S/P ICD (internal cardiac defibrillator) procedure Medtronic CRT ICD 04/15/2015  . Atrial fibrillation with RVR - Paroxysmal 04/08/2015  . NICM- EF 25% 2012 & 20-25% 2016 04/06/2015  . Troponin level elevated 04/06/2015  . Rectal bleeding 10/26/2011  . Hydrocele, left 05/24/2011  . Tobacco use disorder- 1/2 pk a day   . H/O ETOH abuse 6-8 beers a day   . Obesity 09/27/2008  . CAD- minor CAD 2009 and 2016 09/27/2008  . VENTRICULAR TACHYCARDIA 09/27/2008  . Essential hypertension 06/19/2008  . Chronic systolic heart failure (HCC) 06/19/2008    Current Outpatient Medications:  .  acetaminophen (TYLENOL) 325 MG tablet, Take 2 tablets (650 mg total) by mouth every 4 (four) hours as needed for headache or mild pain., Disp: , Rfl:  .  albuterol (PROVENTIL HFA;VENTOLIN HFA) 108 (90 Base) MCG/ACT inhaler, Inhale 1 puff into the lungs every 6 (six) hours as needed for wheezing or shortness of breath., Disp: 1 Inhaler, Rfl: 1 .  amiodarone (PACERONE) 200 MG tablet, Take 1 tablet (200 mg total) daily by mouth., Disp: 30 tablet, Rfl: 6 .  atorvastatin (LIPITOR) 80 MG tablet, Take 1 tablet (80 mg total) by mouth daily at 6 PM., Disp: 30 tablet, Rfl: 11 .  FLUoxetine (PROZAC) 20  MG tablet, Take 20 mg by mouth daily., Disp: , Rfl:  .  potassium chloride SA (KLOR-CON M20) 20 MEQ tablet, Take 2 tablets (40 mEq total) by mouth 2 (two) times daily., Disp: 120 tablet, Rfl: 6 .  sacubitril-valsartan (ENTRESTO) 49-51 MG, Take 1 tablet 2 (two) times daily by mouth., Disp: 60 tablet, Rfl: 3 .  sotalol (BETAPACE) 80 MG tablet, Take 1 tablet (80 mg total) by mouth every 12 (twelve) hours., Disp: 60 tablet, Rfl: 6 .  spironolactone (ALDACTONE) 25 MG tablet, Take 1 tablet (25 mg total) by mouth daily., Disp: 30 tablet, Rfl: 5 .  rivaroxaban (XARELTO) 20 MG TABS tablet, Take 1 tablet (20 mg total) by mouth daily., Disp: 60 tablet, Rfl: 5 .  torsemide (DEMADEX) 20 MG tablet, Take 3 tablets (60 mg total) by mouth 2 (two) times daily., Disp: 180 tablet, Rfl: 5 No Known Allergies    Social History   Socioeconomic History  . Marital status: Divorced    Spouse name: Not on file  . Number of children: 2  . Years of education: Not on file  . Highest education level: Not on file  Social Needs  . Financial resource strain: Not on file  . Food insecurity - worry: Not on file  . Food insecurity - inability: Not on file  . Transportation needs - medical: Not on file  . Transportation needs - non-medical: Not on file  Occupational History  . Occupation: Copy  . Occupation: disabled, odd jobs  Tobacco Use  . Smoking status: Former Smoker    Packs/day: 0.50    Years: 4.00    Pack years: 2.00    Types: Cigarettes  . Smokeless tobacco: Never Used  . Tobacco comment: about a pack a week. social  Substance and Sexual Activity  . Alcohol use: Yes    Alcohol/week: 25.2 oz    Types: 42 Cans of beer per week    Comment: 6 beers per day  . Drug use: Yes    Frequency: 7.0 times per week    Types: Codeine, Marijuana    Comment: has discontinued  . Sexual activity: Yes    Partners: Female  Other Topics Concern  . Not on file  Social History Narrative   ** Merged History  Encounter **        Physical Exam  Constitutional: He is oriented to person, place, and time.  Cardiovascular: Normal rate and regular rhythm.  Pulmonary/Chest: Effort normal and breath sounds normal. No respiratory distress. He has no wheezes. He has no rales.  Abdominal: Soft. He exhibits no distension.  Musculoskeletal: Normal range of motion. He exhibits no edema.  Neurological: He is alert and oriented to person, place, and time.  Skin: Skin is warm and dry.  Psychiatric: He has a normal mood and affect.        Future Appointments  Date Time Provider Department Center  11/12/2017  3:25 PM [facility]  12/17/2017  7:10 AM [facility]ICE [facility]OT[facility]    BP (!) 80/54 (BP Location: Left Arm, Patient Position: Sitting, Cuff Size: Large)   Pulse 66   Resp 16   Wt 295 lb (133.8 kg)   SpO2 98%   BMI 35.91 kg/m   Weight yesterday- 297 lb Last visit weight- 296 lb  [name] was seen at home today and reported feeling well. He denied SOB, headache, dizziness or orthopnea. He has been taking all of his medications but lost his xarelto bottle. I contacted the clinic a secured a sample bottle for him to get through until he can get it filled. His medications were verified and I filled his pillbox. We discussed discharging him from the paramedicine program at length and he said he was feeling confident and agreed that he was ready to move forward without the program. He was encouraged to reach out to me if he felt like he was needing help again.   Time spent with patient: 38 minutes  [name] [name], [name] [name] 10/30/17  ACTION: Home visit completed Next visit planned for 1 week

## 2017-11-06 ENCOUNTER — Telehealth (HOSPITAL_COMMUNITY): Payer: Self-pay | Admitting: Surgery

## 2017-11-06 NOTE — Telephone Encounter (Signed)
Patient has successfully completed and will be discharged at this time from the HF [facility].  He was informed to call the HF Clinic with any needs or concerns related to his HF.

## 2017-11-12 ENCOUNTER — Ambulatory Visit (INDEPENDENT_AMBULATORY_CARE_PROVIDER_SITE_OTHER): Payer: Medicare Other

## 2017-11-12 DIAGNOSIS — I5022 Chronic systolic (congestive) heart failure: Secondary | ICD-10-CM

## 2017-11-12 DIAGNOSIS — Z95810 Presence of automatic (implantable) cardiac defibrillator: Secondary | ICD-10-CM

## 2017-11-12 NOTE — Progress Notes (Signed)
EPIC Encounter for ICM Monitoring  Patient Name: [name] [name] is a 57 y.o. male Date: 11/12/2017 Primary Care Physican: [name], MD Primary Cardiologist: Shirlee Latch Electrophysiologist: Graciela Husbands Dry Weight: 300 lbs  Bi-V Pacing:   99.2%       Heart Failure questions reviewed, pt asymptomatic.   Thoracic impedance normal.  Prescribed dosage: Torsemide 20 mg 3 tablets (60 mg total) twice a day.  Potassium 2 tablets (40 mEq total) twice a day.  Recommendations: No changes.  Advised to limit salt intake.  Encouraged to call for fluid symptoms.  Follow-up plan: ICM clinic phone appointment on 12/17/2017.    Copy of ICM check sent to Dr. Graciela Husbands.   3 month ICM trend: 11/12/2017    1 Year ICM trend:       [name], RN 11/12/2017 11:02 AM

## 2017-12-17 ENCOUNTER — Ambulatory Visit (INDEPENDENT_AMBULATORY_CARE_PROVIDER_SITE_OTHER): Payer: Medicare Other | Admitting: *Deleted

## 2017-12-17 ENCOUNTER — Telehealth: Payer: Self-pay

## 2017-12-17 DIAGNOSIS — I472 Ventricular tachycardia, unspecified: Secondary | ICD-10-CM

## 2017-12-17 DIAGNOSIS — I5022 Chronic systolic (congestive) heart failure: Secondary | ICD-10-CM | POA: Diagnosis not present

## 2017-12-17 DIAGNOSIS — Z95810 Presence of automatic (implantable) cardiac defibrillator: Secondary | ICD-10-CM

## 2017-12-17 LAB — CUP PACEART REMOTE DEVICE CHECK
Brady Statistic AP VP Percent: 64.04 %
Brady Statistic AS VP Percent: 35.67 %
HighPow Impedance: 58 Ohm
Implantable Lead Implant Date: 20160727
Implantable Lead Location: 753858
Implantable Lead Location: 753860
Implantable Lead Model: 5076
Lead Channel Impedance Value: 342 Ohm
Lead Channel Impedance Value: 342 Ohm
Lead Channel Impedance Value: 399 Ohm
Lead Channel Impedance Value: 456 Ohm
Lead Channel Impedance Value: 589 Ohm
Lead Channel Impedance Value: 608 Ohm
Lead Channel Pacing Threshold Amplitude: 0.5 V
Lead Channel Pacing Threshold Amplitude: 0.5 V
Lead Channel Pacing Threshold Amplitude: 1.5 V
Lead Channel Pacing Threshold Pulse Width: 0.4 ms
Lead Channel Pacing Threshold Pulse Width: 0.6 ms
Lead Channel Sensing Intrinsic Amplitude: 2 mV
Lead Channel Sensing Intrinsic Amplitude: 2 mV
Lead Channel Setting Sensing Sensitivity: 0.3 mV
MDC IDC LEAD IMPLANT DT: 20160727
MDC IDC LEAD IMPLANT DT: 20160727
MDC IDC LEAD LOCATION: 753859
MDC IDC MSMT BATTERY REMAINING LONGEVITY: 45 mo
MDC IDC MSMT BATTERY VOLTAGE: 2.96 V
MDC IDC MSMT LEADCHNL LV IMPEDANCE VALUE: 304 Ohm
MDC IDC MSMT LEADCHNL LV IMPEDANCE VALUE: 361 Ohm
MDC IDC MSMT LEADCHNL LV IMPEDANCE VALUE: 532 Ohm
MDC IDC MSMT LEADCHNL LV IMPEDANCE VALUE: 551 Ohm
MDC IDC MSMT LEADCHNL LV IMPEDANCE VALUE: 608 Ohm
MDC IDC MSMT LEADCHNL RA PACING THRESHOLD PULSEWIDTH: 0.4 ms
MDC IDC MSMT LEADCHNL RV IMPEDANCE VALUE: 456 Ohm
MDC IDC MSMT LEADCHNL RV IMPEDANCE VALUE: 513 Ohm
MDC IDC MSMT LEADCHNL RV SENSING INTR AMPL: 11.375 mV
MDC IDC MSMT LEADCHNL RV SENSING INTR AMPL: 11.375 mV
MDC IDC PG IMPLANT DT: 20160727
MDC IDC SESS DTM: 20190401083623
MDC IDC SET LEADCHNL LV PACING AMPLITUDE: 2.5 V
MDC IDC SET LEADCHNL LV PACING PULSEWIDTH: 0.6 ms
MDC IDC SET LEADCHNL RA PACING AMPLITUDE: 1.5 V
MDC IDC SET LEADCHNL RV PACING AMPLITUDE: 2 V
MDC IDC SET LEADCHNL RV PACING PULSEWIDTH: 0.4 ms
MDC IDC STAT BRADY AP VS PERCENT: 0.08 %
MDC IDC STAT BRADY AS VS PERCENT: 0.21 %
MDC IDC STAT BRADY RA PERCENT PACED: 63.94 %
MDC IDC STAT BRADY RV PERCENT PACED: 99.43 %

## 2017-12-17 NOTE — Progress Notes (Signed)
Remote ICD transmission.   

## 2017-12-17 NOTE — Progress Notes (Signed)
EPIC Encounter for ICM Monitoring  Patient Name: [name] [name] is a 57 y.o. male Date: 12/17/2017 Primary Care Physican: [name], MD Primary Cardiologist: Aundra Dubin Electrophysiologist: Caryl Comes Dry Weight:   Previous weight 300 lbs  Bi-V Pacing:   99.4%      Attempted call to patient and unable to reach.  Left detailed message regarding transmission.  Transmission reviewed.    Thoracic impedance close to normal today but was abnormal suggesting fluid accumulation from 11/30/2017 through today with exception of 2 days at baseline.  Prescribed dosage: Torsemide 20 mg 3 tablets (60 mg total) twice a day.  Potassium 77mq 2 tablets (40 mEq total) twice a day.  Labs: 09/25/2017 Creatinine 1.23, BUN 22, Potassium 4.2, Sodium 137, EGFR >60 08/13/2018 Creatinine 1.19, BUN 16, Potassium 4.4, Sodium 136, EGFR >60  A complete set of results can be found in Results Review.  Recommendations: Left voice mail with ICM number and encouraged to call if experiencing any fluid symptoms.  Follow-up plan: ICM clinic phone appointment on 01/17/2018.  Due this month to make a 3 month follow up appointment with Dr MAundra Dubin Copy of ICM check sent to Dr. [name]   3 month ICM trend: 12/17/2017    1 Year ICM trend:       [name] RN 12/17/2017 11:47 AM

## 2017-12-17 NOTE — Telephone Encounter (Signed)
Remote ICM transmission received.  Attempted call to patient and left detailed message per DPR regarding transmission and next ICM scheduled for 01/17/2018.  Advised to return call for any fluid symptoms or questions.    

## 2017-12-18 ENCOUNTER — Encounter: Payer: Self-pay | Admitting: Cardiology

## 2017-12-31 ENCOUNTER — Other Ambulatory Visit: Payer: Self-pay | Admitting: Cardiology

## 2018-01-01 ENCOUNTER — Telehealth: Payer: Self-pay | Admitting: *Deleted

## 2018-01-01 NOTE — Telephone Encounter (Signed)
Faxed 09/25/17 Dr. Shirlee Latch face to face office note to [facility] per request received by Angie.

## 2018-01-17 ENCOUNTER — Ambulatory Visit (INDEPENDENT_AMBULATORY_CARE_PROVIDER_SITE_OTHER): Payer: Medicare Other

## 2018-01-17 ENCOUNTER — Telehealth: Payer: Self-pay

## 2018-01-17 DIAGNOSIS — I5022 Chronic systolic (congestive) heart failure: Secondary | ICD-10-CM | POA: Diagnosis not present

## 2018-01-17 DIAGNOSIS — Z95810 Presence of automatic (implantable) cardiac defibrillator: Secondary | ICD-10-CM

## 2018-01-17 NOTE — Telephone Encounter (Signed)
LVM to discuss ATP episode from 12/22/2017@ 1940.

## 2018-01-17 NOTE — Progress Notes (Signed)
EPIC Encounter for ICM Monitoring  Patient Name: [name] [name] is a 57 y.o. male Date: 01/17/2018 Primary Care Physican: [name], MD Primary Cardiologist:[name] Electrophysiologist:[name] Dry Weight:Previous weight300lbs  Bi-V Pacing:99.4%       Attempted call to patient and unable to reach.  Left detailed message regarding transmission.  Transmission reviewed.    Thoracic impedance normal.  Prescribed dosage: Torsemide20 mg 3 tablets (60 mg total) twice a day. Potassium 73mq 2 tablets (40 mEq total) twice a day.  Labs: 09/25/2017 Creatinine 1.23, BUN 22, Potassium 4.2, Sodium 137, EGFR >60 08/13/2018 Creatinine 1.19, BUN 16, Potassium 4.4, Sodium 136, EGFR >60  A complete set of results can be found in Results Review.  Recommendations: Left voice mail with ICM number and encouraged to call if experiencing any fluid symptoms.  Follow-up plan: ICM clinic phone appointment on 02/18/2018.  April recall for Dr MAundra Dubinand June recall for AChanetta Marshall NP  Copy of ICM check sent to Dr. [name]   3 month ICM trend: 01/17/2018    1 Year ICM trend:       [name] RN 01/17/2018 11:54 AM

## 2018-01-17 NOTE — Telephone Encounter (Signed)
Spoke with pt regarding ATP episode pt doesn't recall any symptoms from that date pt reported compliance with Sotalol 80mg  BID and Amiodarone 200mg  daily pt aware of DMV driving restrictions.

## 2018-02-18 ENCOUNTER — Ambulatory Visit (INDEPENDENT_AMBULATORY_CARE_PROVIDER_SITE_OTHER): Payer: Medicare Other

## 2018-02-18 DIAGNOSIS — Z95810 Presence of automatic (implantable) cardiac defibrillator: Secondary | ICD-10-CM

## 2018-02-18 DIAGNOSIS — I5022 Chronic systolic (congestive) heart failure: Secondary | ICD-10-CM | POA: Diagnosis not present

## 2018-02-18 NOTE — Progress Notes (Signed)
EPIC Encounter for ICM Monitoring  Patient Name: [name] [name] is a 57 y.o. male Date: 02/18/2018 Primary Care Physican: [name], MD Primary Cardiologist:[name] Electrophysiologist:[name] Dry Weight:Previous weight300lbs  Bi-V Pacing:99.5%       Attempted call to patient and unable to reach.  Left detailed message, per DPR, regarding transmission.  Transmission reviewed.    Thoracic impedance normal.  Prescribed dosage: Torsemide20 mg 3 tablets (60 mg total) twice a day. Potassium 65mq 2 tablets (40 mEq total) twice a day.  Labs: 09/25/2017 Creatinine1.23,[name] Potassium4.2, [id number] EGFR>60 08/13/2018 Creatinine1.19, BUN16, Potassium4.4, Sodium136, EGFR>60 A complete set of results can be found in Results Review.  Recommendations:  Left voice mail with ICM number and encouraged to call if experiencing any fluid symptoms.  Follow-up plan: ICM clinic phone appointment on 03/25/2018.    Copy of ICM check sent to Dr. [name]   3 month ICM trend: 02/18/2018    1 Year ICM trend:       [name] RN 02/18/2018 12:13 PM

## 2018-02-19 ENCOUNTER — Telehealth: Payer: Self-pay

## 2018-02-19 NOTE — Telephone Encounter (Signed)
Remote ICM transmission received.  Attempted call to patient and left detailed message, per DPR, regarding transmission and next ICM scheduled for 03/25/2018.  Advised to return call for any fluid symptoms or questions.    

## 2018-03-06 ENCOUNTER — Other Ambulatory Visit (HOSPITAL_COMMUNITY): Payer: Self-pay | Admitting: Cardiology

## 2018-03-19 ENCOUNTER — Other Ambulatory Visit (HOSPITAL_COMMUNITY): Payer: Self-pay | Admitting: Cardiology

## 2018-03-19 MED ORDER — RIVAROXABAN 20 MG PO TABS: 20.0000 mg | ORAL_TABLET | Freq: Every day | ORAL | 11 refills | Status: DC

## 2018-03-19 NOTE — Telephone Encounter (Signed)
rx printed to accompany PA application  

## 2018-03-25 ENCOUNTER — Ambulatory Visit (INDEPENDENT_AMBULATORY_CARE_PROVIDER_SITE_OTHER): Payer: Medicare Other

## 2018-03-25 ENCOUNTER — Ambulatory Visit (INDEPENDENT_AMBULATORY_CARE_PROVIDER_SITE_OTHER): Payer: Medicare Other | Admitting: *Deleted

## 2018-03-25 DIAGNOSIS — I428 Other cardiomyopathies: Secondary | ICD-10-CM

## 2018-03-25 DIAGNOSIS — I5022 Chronic systolic (congestive) heart failure: Secondary | ICD-10-CM

## 2018-03-25 DIAGNOSIS — Z95810 Presence of automatic (implantable) cardiac defibrillator: Secondary | ICD-10-CM | POA: Diagnosis not present

## 2018-03-25 NOTE — Progress Notes (Signed)
Remote ICD transmission.   

## 2018-03-25 NOTE — Progress Notes (Signed)
EPIC Encounter for ICM Monitoring  Patient Name: [name] [name] is a 58 y.o. male Date: 03/25/2018 Primary Care Physican: [name], MD Primary Cardiologist:[name] Electrophysiologist:[name] Dry Weight:295lbs  Bi-V Pacing:99.7%        Heart Failure questions reviewed, pt asymptomatic.   Thoracic impedance normal.  Prescribed dosage: Torsemide20 mg 3 tablets (60 mg total) twice a day. Potassium 64mq 2 tablets (40 mEq total) twice a day.  Labs: 09/25/2017 Creatinine1.23,[name] Potassium4.2, [id number] EGFR>60 08/13/2018 Creatinine1.19, BUN16, Potassium4.4, Sodium136, EGFR>60 A complete set of results can be found in Results Review.  Recommendations: No changes.   Encouraged to call for fluid symptoms.  Follow-up plan: ICM clinic phone appointment on 04/29/2018.    Appointments: Advised to call Dr MOleh Geninoffice for 3 month f/u appointment and Dr KAquilla Hackeroffice to schedule an appointment with AChanetta Marshall NP (6 month f/u).  Copy of ICM check sent to Dr. [name]   3 month ICM trend: 03/25/2018    1 Year ICM trend:       [name] RN 03/25/2018 10:39 AM

## 2018-03-28 ENCOUNTER — Telehealth (HOSPITAL_COMMUNITY): Payer: Self-pay | Admitting: *Deleted

## 2018-03-28 NOTE — Telephone Encounter (Signed)
Medication Samples have been provided to the patient.  Drug name: Xarelto       Strength: 20mg         Qty: 3  LOT: [id number]  Exp.Date: 4/21  Dosing instructions: Take 1 Tablet by mouth once daily.  The patient has been instructed regarding the correct time, dose, and frequency of taking this medication, including desired effects and most common side effects.   Georgina Peer 2:18 PM 03/28/2018

## 2018-03-29 LAB — CUP PACEART REMOTE DEVICE CHECK
Battery Remaining Longevity: 34 mo
Battery Voltage: 2.95 V
Brady Statistic AP VP Percent: 62.78 %
Brady Statistic AS VS Percent: 0.08 %
Date Time Interrogation Session: 20190708084224
HIGH POWER IMPEDANCE MEASURED VALUE: 56 Ohm
Implantable Lead Location: 753858
Implantable Lead Location: 753859
Implantable Lead Location: 753860
Implantable Lead Model: 4598
Implantable Lead Model: 6935
Implantable Pulse Generator Implant Date: 20160727
Lead Channel Impedance Value: 399 Ohm
Lead Channel Impedance Value: 456 Ohm
Lead Channel Impedance Value: 532 Ohm
Lead Channel Impedance Value: 532 Ohm
Lead Channel Impedance Value: 551 Ohm
Lead Channel Impedance Value: 551 Ohm
Lead Channel Impedance Value: 551 Ohm
Lead Channel Impedance Value: 608 Ohm
Lead Channel Pacing Threshold Amplitude: 0.5 V
Lead Channel Pacing Threshold Amplitude: 0.5 V
Lead Channel Pacing Threshold Amplitude: 1.875 V
Lead Channel Pacing Threshold Pulse Width: 0.6 ms
Lead Channel Sensing Intrinsic Amplitude: 1.625 mV
Lead Channel Sensing Intrinsic Amplitude: 10.5 mV
Lead Channel Setting Pacing Amplitude: 2 V
Lead Channel Setting Pacing Pulse Width: 0.4 ms
Lead Channel Setting Pacing Pulse Width: 0.6 ms
Lead Channel Setting Sensing Sensitivity: 0.3 mV
MDC IDC LEAD IMPLANT DT: 20160727
MDC IDC LEAD IMPLANT DT: 20160727
MDC IDC LEAD IMPLANT DT: 20160727
MDC IDC MSMT LEADCHNL LV IMPEDANCE VALUE: 342 Ohm
MDC IDC MSMT LEADCHNL LV IMPEDANCE VALUE: 342 Ohm
MDC IDC MSMT LEADCHNL LV IMPEDANCE VALUE: 342 Ohm
MDC IDC MSMT LEADCHNL LV IMPEDANCE VALUE: 399 Ohm
MDC IDC MSMT LEADCHNL RA IMPEDANCE VALUE: 418 Ohm
MDC IDC MSMT LEADCHNL RA PACING THRESHOLD PULSEWIDTH: 0.4 ms
MDC IDC MSMT LEADCHNL RA SENSING INTR AMPL: 1.625 mV
MDC IDC MSMT LEADCHNL RV PACING THRESHOLD PULSEWIDTH: 0.4 ms
MDC IDC MSMT LEADCHNL RV SENSING INTR AMPL: 10.5 mV
MDC IDC SET LEADCHNL LV PACING AMPLITUDE: 3 V
MDC IDC SET LEADCHNL RA PACING AMPLITUDE: 1.5 V
MDC IDC STAT BRADY AP VS PERCENT: 0.06 %
MDC IDC STAT BRADY AS VP PERCENT: 37.09 %
MDC IDC STAT BRADY RA PERCENT PACED: 62.76 %
MDC IDC STAT BRADY RV PERCENT PACED: 99.74 %

## 2018-04-06 ENCOUNTER — Other Ambulatory Visit (HOSPITAL_COMMUNITY): Payer: Self-pay | Admitting: Cardiology

## 2018-04-11 ENCOUNTER — Telehealth: Payer: Self-pay | Admitting: *Deleted

## 2018-04-11 NOTE — Telephone Encounter (Signed)
Dr Shirlee Latch 09/25/17 office note re faxed to Angie @ [facility].

## 2018-04-29 ENCOUNTER — Ambulatory Visit (INDEPENDENT_AMBULATORY_CARE_PROVIDER_SITE_OTHER): Payer: Medicare Other

## 2018-04-29 DIAGNOSIS — I5022 Chronic systolic (congestive) heart failure: Secondary | ICD-10-CM | POA: Diagnosis not present

## 2018-04-29 DIAGNOSIS — Z95810 Presence of automatic (implantable) cardiac defibrillator: Secondary | ICD-10-CM

## 2018-04-29 NOTE — Progress Notes (Signed)
EPIC Encounter for ICM Monitoring  Patient Name: [name] [name] is a 57 y.o. male Date: 04/29/2018 Primary Care Physican: [name] Downing, MD Primary Cardiologist:[name] [name] Electrophysiologist:[name] [name] Dry Weight: Previous weight 295lbs  Bi-V Pacing:98.7%      Heart Failure questions reviewed, pt asymptomatic.   Thoracic impedance normal.  Prescribed dosage: Torsemide20 mg 3 tablets (60 mg total) twice a day. Potassium 61mq 2 tablets (40 mEq total) twice a day.  Labs: 09/25/2017 Creatinine1.23,[name] [name] Potassium4.2, [name] [name] EGFR>60 08/13/2018 Creatinine1.19, BUN16, Potassium4.4, Sodium136, EGFR>60 A complete set of results can be found in Results Review.  Recommendations: No changes.   Encouraged to call for fluid symptoms.  Follow-up plan: ICM clinic phone appointment on 05/30/2018.    Copy of ICM check sent to Dr. KCaryl [name]   3 month ICM trend: 04/29/2018    1 Year ICM trend:       [name] Billings RN 04/29/2018 12:09 PM

## 2018-05-02 ENCOUNTER — Other Ambulatory Visit (HOSPITAL_COMMUNITY): Payer: Self-pay | Admitting: Cardiology

## 2018-05-30 ENCOUNTER — Ambulatory Visit (INDEPENDENT_AMBULATORY_CARE_PROVIDER_SITE_OTHER): Payer: Medicare Other

## 2018-05-30 DIAGNOSIS — I5022 Chronic systolic (congestive) heart failure: Secondary | ICD-10-CM

## 2018-05-30 DIAGNOSIS — Z95810 Presence of automatic (implantable) cardiac defibrillator: Secondary | ICD-10-CM

## 2018-05-31 NOTE — Progress Notes (Signed)
EPIC Encounter for ICM Monitoring  Patient Name: [name] [name] is a 57 y.o. male Date: 05/31/2018 Primary Care Physican: [name] Downing, MD Primary Cardiologist:[name] [name] Electrophysiologist:[name] [name] Dry Weight:300lbs  Bi-V Pacing:98.4%         Heart Failure questions reviewed, pt asymptomatic.  Has some shortness of breath when walking up stairs.   Thoracic impedance normal.  Prescribed: Torsemide20 mg 3 tablets (60 mg total) twice a day. Potassium 70mq 2 tablets (40 mEq total) twice a day.  Labs: 09/25/2017 Creatinine1.23,[name] [name] Potassium4.2, [name] EGFR>60 08/13/2018 Creatinine1.19, BUN16, Potassium4.4, Sodium136, EGFR>60 A complete set of results can be found in Results Review.  Recommendations: No changes.   Encouraged to call for fluid symptoms.  Follow-up plan: ICM clinic phone appointment on 07/01/2018.  Advised he is due to schedule appt with [name] Marshall NP (last visit w/[name] [name]12/28/18) and [name] [name](last visit 09/25/17).  Copy of ICM check sent to [name]. KCaryl [name]   3 month ICM trend: 05/30/2018    1 Year ICM trend:       [name] Billings RN 05/31/2018 10:10 AM

## 2018-06-07 ENCOUNTER — Other Ambulatory Visit (HOSPITAL_COMMUNITY): Payer: Self-pay

## 2018-06-07 ENCOUNTER — Telehealth (HOSPITAL_COMMUNITY): Payer: Self-pay

## 2018-06-07 NOTE — Telephone Encounter (Addendum)
I called [name] [name] to let him know I had the sample medications he requested and to ask if he had picked up the medication expenditure list necessary for the [employer] medication assistance fund. He stated he had not been to the pharmacy yet but would go get the letter today. I asked that he call and let me know when he had picked it up and I would bring his samples then. He was understanding and agreeable.

## 2018-06-07 NOTE — Telephone Encounter (Signed)
[name] [name] called me to let me know he was home from the pharmacy and I could come to his house when I was available.

## 2018-06-07 NOTE — Progress Notes (Signed)
I saw [name] [name] at home and he reported feeling well. The purpose of this visit was to bring sample medications and pick up his medications expenditure list from the pharmacy so his medications assistance application could be completed. Nothing further was necessary during the time of my visit.

## 2018-06-10 ENCOUNTER — Other Ambulatory Visit (HOSPITAL_COMMUNITY): Payer: Self-pay | Admitting: Pharmacist

## 2018-06-10 MED ORDER — SACUBITRIL-VALSARTAN 49-51 MG PO TABS: 1.0000 | ORAL_TABLET | Freq: Two times a day (BID) | ORAL | 5 refills | Status: DC

## 2018-06-10 MED ORDER — RIVAROXABAN 20 MG PO TABS: 20.0000 mg | ORAL_TABLET | Freq: Every day | ORAL | 11 refills | Status: DC

## 2018-06-21 ENCOUNTER — Telehealth (HOSPITAL_COMMUNITY): Payer: Self-pay | Admitting: Pharmacist

## 2018-06-21 NOTE — Telephone Encounter (Signed)
[employer] patient assistance denied since patient has not met out of pocket expense of $767.04 and probably will not by the end of the year since he's only spent $108 so far this year. Patient notified by [employer] PA[employer].   Tiffannie Sloss K. Vira Chaplin, PharmD, BCPS, CPP Clinical Pharmacist Phone: [phone] 06/21/2018 10:42 AM   

## 2018-07-01 ENCOUNTER — Ambulatory Visit (INDEPENDENT_AMBULATORY_CARE_PROVIDER_SITE_OTHER): Payer: Medicare Other | Admitting: *Deleted

## 2018-07-01 ENCOUNTER — Ambulatory Visit (INDEPENDENT_AMBULATORY_CARE_PROVIDER_SITE_OTHER): Payer: Medicare Other

## 2018-07-01 DIAGNOSIS — I5022 Chronic systolic (congestive) heart failure: Secondary | ICD-10-CM

## 2018-07-01 DIAGNOSIS — I428 Other cardiomyopathies: Secondary | ICD-10-CM

## 2018-07-01 DIAGNOSIS — Z95810 Presence of automatic (implantable) cardiac defibrillator: Secondary | ICD-10-CM | POA: Diagnosis not present

## 2018-07-01 NOTE — Progress Notes (Signed)
Remote ICD transmission.   

## 2018-07-02 NOTE — Progress Notes (Signed)
EPIC Encounter for ICM Monitoring  Patient Name: [name] [name] is a 57 y.o. male Date: 07/02/2018 Primary Care Physican: [name], MD Primary Cardiologist:[name] Electrophysiologist:[name] Dry Weight:305lbs  Bi-V Pacing:99.4%       Heart Failure questions reviewed, pt asymptomatic and feeling good.   Thoracic impedance normal.   Prescribed: Torsemide20 mg 3 tablets (60 mg total) twice a day. Potassium 69mq 2 tablets (40 mEq total) twice a day.  Labs: 09/25/2017 Creatinine1.23,[name] Potassium4.2, [id number] EGFR>60 08/13/2018 Creatinine1.19, BUN16, Potassium4.4, Sodium136, EGFR>60 A complete set of results can be found in Results Review.  Recommendations: No changes.   Encouraged to call for fluid symptoms.  Follow-up plan: ICM clinic phone appointment on 08/01/2018.     Advised he is due to schedule appt with AChanetta Marshall NP (last visit w/Dr [name]12/28/18) and Dr MAundra Dubin(last visit 09/25/17).  Copy of ICM check sent to Dr. [name]   3 month ICM trend: 07/01/2018    1 Year ICM trend:       [name] RN 07/02/2018 12:33 PM

## 2018-07-04 ENCOUNTER — Encounter: Payer: Self-pay | Admitting: Cardiology

## 2018-07-12 ENCOUNTER — Other Ambulatory Visit: Payer: Self-pay | Admitting: Internal Medicine

## 2018-07-24 LAB — CUP PACEART REMOTE DEVICE CHECK
Brady Statistic AS VP Percent: 38.36 %
Brady Statistic RA Percent Paced: 61.23 %
Brady Statistic RV Percent Paced: 99.36 %
Date Time Interrogation Session: 20191014052302
HighPow Impedance: 64 Ohm
Implantable Lead Implant Date: 20160727
Implantable Lead Location: 753858
Implantable Pulse Generator Implant Date: 20160727
Lead Channel Impedance Value: 304 Ohm
Lead Channel Impedance Value: 342 Ohm
Lead Channel Impedance Value: 399 Ohm
Lead Channel Impedance Value: 418 Ohm
Lead Channel Impedance Value: 551 Ohm
Lead Channel Impedance Value: 589 Ohm
Lead Channel Pacing Threshold Amplitude: 0.5 V
Lead Channel Pacing Threshold Pulse Width: 0.4 ms
Lead Channel Sensing Intrinsic Amplitude: 1.625 mV
Lead Channel Sensing Intrinsic Amplitude: 15.625 mV
Lead Channel Sensing Intrinsic Amplitude: 15.625 mV
Lead Channel Setting Pacing Amplitude: 2.5 V
Lead Channel Setting Pacing Pulse Width: 0.6 ms
MDC IDC LEAD IMPLANT DT: 20160727
MDC IDC LEAD IMPLANT DT: 20160727
MDC IDC LEAD LOCATION: 753859
MDC IDC LEAD LOCATION: 753860
MDC IDC MSMT BATTERY REMAINING LONGEVITY: 33 mo
MDC IDC MSMT BATTERY VOLTAGE: 2.95 V
MDC IDC MSMT LEADCHNL LV IMPEDANCE VALUE: 342 Ohm
MDC IDC MSMT LEADCHNL LV IMPEDANCE VALUE: 399 Ohm
MDC IDC MSMT LEADCHNL LV IMPEDANCE VALUE: 551 Ohm
MDC IDC MSMT LEADCHNL LV IMPEDANCE VALUE: 608 Ohm
MDC IDC MSMT LEADCHNL LV IMPEDANCE VALUE: 608 Ohm
MDC IDC MSMT LEADCHNL LV PACING THRESHOLD AMPLITUDE: 1.5 V
MDC IDC MSMT LEADCHNL LV PACING THRESHOLD PULSEWIDTH: 0.6 ms
MDC IDC MSMT LEADCHNL RA PACING THRESHOLD AMPLITUDE: 0.5 V
MDC IDC MSMT LEADCHNL RA PACING THRESHOLD PULSEWIDTH: 0.4 ms
MDC IDC MSMT LEADCHNL RA SENSING INTR AMPL: 1.625 mV
MDC IDC MSMT LEADCHNL RV IMPEDANCE VALUE: 418 Ohm
MDC IDC MSMT LEADCHNL RV IMPEDANCE VALUE: 475 Ohm
MDC IDC SET LEADCHNL RA PACING AMPLITUDE: 1.5 V
MDC IDC SET LEADCHNL RV PACING AMPLITUDE: 2 V
MDC IDC SET LEADCHNL RV PACING PULSEWIDTH: 0.4 ms
MDC IDC SET LEADCHNL RV SENSING SENSITIVITY: 0.3 mV
MDC IDC STAT BRADY AP VP PERCENT: 61.37 %
MDC IDC STAT BRADY AP VS PERCENT: 0.06 %
MDC IDC STAT BRADY AS VS PERCENT: 0.21 %

## 2018-07-29 NOTE — Progress Notes (Deleted)
Electrophysiology Office Note Date: 07/29/2018  ID:  [name] [name], DOB 01-31-1961, MRN [id number]  PCP: [name], MD Primary Cardiologist: Shirlee Latch Electrophysiologist: Graciela Husbands  CC: Routine ICD follow-up  [name] [name] is a 57 y.o. male seen today for Dr Graciela Husbands.  He presents today for routine electrophysiology followup.  Since last being seen in our clinic, the patient reports doing very well. He denies chest pain, palpitations, dyspnea, PND, orthopnea, nausea, vomiting, dizziness, syncope, edema, weight gain, or early satiety.  He has not had ICD shocks.   Device History: MDT CRTD implanted 2016 for CHF, VT History of appropriate therapy: Yes History of AAD therapy: Yes - amiodarone    Past Medical History:  Diagnosis Date  . CAD (coronary artery disease)    Mild nonobstructive (Cath 09)  . Chronic systolic heart failure (HCC)    Nonischemic CM: echo 4/12 with EF 25-30%, grade 2 diast dysfxn, mild dilated aortic root 43 mm, trivial MR, mod LAE  . COPD (chronic obstructive pulmonary disease) (HCC)   . Dyspnea   . H/O ETOH abuse   . HTN (hypertension)   . Hypersomnia   . Obesity, morbid (HCC)   . OSA on CPAP   . Systolic and diastolic CHF, acute on chronic (HCC) 11/2016  . Tobacco use disorder   . Ventricular tachycardia [facility])    Past Surgical History:  Procedure Laterality Date  . CARDIAC CATHETERIZATION N/A 04/08/2015   Procedure: Right/Left Heart Cath and Coronary Angiography;  Surgeon: Corky Crafts, MD;  Location: [facility] INVASIVE CV LAB;  Service: Cardiovascular;  Laterality: N/A;  . EP IMPLANTABLE DEVICE N/A 04/14/2015   Procedure: BiV ICD Insertion CRT-D;  Surgeon: [name], MD;  Location: [facility] INVASIVE CV LAB;  Service: Cardiovascular;  Laterality: N/A;  . HYDROCELE EXCISION / REPAIR    . TUMOR EXCISION Left   . 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 VASECTOMY      Current Outpatient Medications  Medication Sig Dispense  Refill  . acetaminophen (TYLENOL) 325 MG tablet Take 2 tablets (650 mg total) by mouth every 4 (four) hours as needed for headache or mild pain.    Marland Kitchen albuterol (PROVENTIL HFA;VENTOLIN HFA) 108 (90 Base) MCG/ACT inhaler Inhale 1 puff into the lungs every 6 (six) hours as needed for wheezing or shortness of breath. 1 Inhaler 1  . amiodarone (PACERONE) 200 MG tablet TAKE 1 TABLET BY MOUTH ONCE DAILY 30 tablet 3  . atorvastatin (LIPITOR) 80 MG tablet Take 1 tablet (80 mg total) by mouth daily at 6 PM. 30 tablet 11  . FLUoxetine (PROZAC) 20 MG tablet Take 20 mg by mouth daily.    . potassium chloride SA (K-DUR,KLOR-CON) 20 MEQ tablet TAKE 2 TABLETS BY MOUTH TWICE DAILY 120 tablet 5  . rivaroxaban (XARELTO) 20 MG TABS tablet Take 1 tablet (20 mg total) by mouth daily. 30 tablet 11  . sacubitril-valsartan (ENTRESTO) 49-51 MG Take 1 tablet by mouth 2 (two) times daily. 60 tablet 5  . sotalol (BETAPACE) 80 MG tablet TAKE 1 TABLET BY MOUTH EVERY 12 HOURS 60 tablet 1  . spironolactone (ALDACTONE) 25 MG tablet TAKE 1 TABLET BY MOUTH ONCE DAILY 30 tablet 5  . torsemide (DEMADEX) 20 MG tablet TAKE 3 TABLETS BY MOUTH TWICE DAILY 180 tablet 5   No current facility-administered medications for this visit.     Allergies:   Patient has no known allergies.   Social History: Social History  Socioeconomic History  . Marital status: Divorced    Spouse name: Not on file  . Number of children: 2  . Years of education: Not on file  . Highest education level: Not on file  Occupational History  . Occupation: Copy  . Occupation: disabled, odd jobs  Social Needs  . Financial resource strain: Not on file  . Food insecurity:    Worry: Not on file    Inability: Not on file  . Transportation needs:    Medical: Not on file    Non-medical: Not on file  Tobacco Use  . Smoking status: Former Smoker    Packs/day: 0.50    Years: 4.00    Pack years: 2.00    Types: Cigarettes  . Smokeless tobacco: Never  Used  . Tobacco comment: about a pack a week. social  Substance and Sexual Activity  . Alcohol use: Yes    Alcohol/week: 42.0 standard drinks    Types: 42 Cans of beer per week    Comment: 6 beers per day  . Drug use: Yes    Frequency: 7.0 times per week    Types: Codeine, Marijuana    Comment: has discontinued  . Sexual activity: Yes    Partners: Female  Lifestyle  . Physical activity:    Days per week: Not on file    Minutes per session: Not on file  . Stress: Not on file  Relationships  . Social connections:    Talks on phone: Not on file    Gets together: Not on file    Attends religious service: Not on file    Active member of club or organization: Not on file    Attends meetings of clubs or organizations: Not on file    Relationship status: Not on file  . Intimate partner violence:    Fear of current or ex partner: Not on file    Emotionally abused: Not on file    Physically abused: Not on file    Forced sexual activity: Not on file  Other Topics Concern  . Not on file  Social History Narrative   ** Merged History Encounter **        Family History: Family History  Problem Relation Age of Onset  . Other Mother        cardiac surgery. late 1990s  . Heart Problems Mother        CABG AGE 22  . Congestive Heart Failure Father   . Healthy Father        AGE 68  . Coronary artery disease Unknown   . Healthy Brother        AGE 89  . Healthy Sister        AGE 33  . Healthy Sister        AGE 53  . Healthy Son   . Healthy Son   . Healthy Daughter     Review of Systems: All other systems reviewed and are otherwise negative except as noted above.   Physical Exam: VS:  There were no vitals taken for this visit. , BMI There is no height or weight on file to calculate BMI.  GEN- The patient is well appearing, alert and oriented x 3 today.   HEENT: normocephalic, atraumatic; sclera clear, conjunctiva pink; hearing intact; oropharynx clear; neck supple, no  JVP Lymph- no cervical lymphadenopathy Lungs- Clear to ausculation bilaterally, normal work of breathing.  No wheezes, rales, rhonchi Heart- Regular rate and rhythm, no murmurs,  rubs or gallops, PMI not laterally displaced GI- soft, non-tender, non-distended, bowel sounds present, no hepatosplenomegaly Extremities- no clubbing, cyanosis, or edema; DP/PT/radial pulses 2+ bilaterally MS- no significant deformity or atrophy Skin- warm and dry, no rash or lesion; ICD pocket well healed Psych- euthymic mood, full affect Neuro- strength and sensation are intact  ICD interrogation- reviewed in detail today,  See PACEART report  EKG:  EKG is ordered today. The ekg ordered today shows ***  Recent Labs: 07/30/2017: TSH 0.860 09/14/2017: Magnesium 2.4 09/25/2017: ALT 26; BUN 22; Creatinine, Ser 1.23; Hemoglobin 10.8; Platelets 165; Potassium 4.2; Sodium 137   Wt Readings from Last 3 Encounters:  10/30/17 295 lb (133.8 kg)  10/18/17 296 lb (134.3 kg)  10/10/17 295 lb (133.8 kg)     Other studies Reviewed: Additional studies/ records that were reviewed today include: Dr Graciela Husbands and Dr Alford Highland office notes   Assessment and Plan:  1.  Chronic systolic dysfunction euvolemic today Stable on an appropriate medical regimen Normal ICD function See Pace Art report No changes today Continue follow up in ICM clinic  2.  VT No recent recurrence Continue amiodarone and sotalol Surveillance labs today  QTc stable   3.  Paroxysmal atrial fibrillation Burden by device interrogation ***% Continue Xarelto for CHADS2VASC of 3  4.  OSA Compliant with CPAP    Current medicines are reviewed at length with the patient today.   The patient does not have concerns regarding his medicines.  The following changes were made today:  none  Labs/ tests ordered today include: BMET, Mg, CBC, LFT's  No orders of the defined types were placed in this encounter.    Disposition:   Follow up with [facility],  ICM clinic, AHF clinic 3 months (overdue), Dr Graciela Husbands 6 months     Signed, Gypsy Balsam, NP 07/29/2018 12:37 PM  [facility] [address] [place] [address] [phone] (office) [phone] (fax)

## 2018-07-30 ENCOUNTER — Encounter: Payer: Medicare Other | Admitting: Nurse Practitioner

## 2018-08-01 ENCOUNTER — Ambulatory Visit (INDEPENDENT_AMBULATORY_CARE_PROVIDER_SITE_OTHER): Payer: Medicare Other

## 2018-08-01 DIAGNOSIS — Z95810 Presence of automatic (implantable) cardiac defibrillator: Secondary | ICD-10-CM | POA: Diagnosis not present

## 2018-08-01 DIAGNOSIS — I5022 Chronic systolic (congestive) heart failure: Secondary | ICD-10-CM | POA: Diagnosis not present

## 2018-08-02 NOTE — Progress Notes (Signed)
EPIC Encounter for ICM Monitoring  Patient Name: [name] [name] is a 57 y.o. male Date: 08/02/2018 Primary Care Physican: [name], MD Primary Cardiologist:[name] Electrophysiologist:[name] Bi-V Pacing:99%     Last Weight: 305lbs Today's Weight: 310 lbs        Heart Failure questions reviewed, pt asymptomatic.  He feels weight gain is from eating not fluid.   Thoracic impedance normal.   Prescribed: Torsemide20 mg 3 tablets (60 mg total) twice a day. Potassium 34mq 2 tablets (40 mEq total) twice a day.  Labs: 09/25/2017 Creatinine1.23,[name] Potassium4.2, [id number] EGFR>60 08/13/2018 Creatinine1.19, BUN16, Potassium4.4, Sodium136, EGFR>60 A complete set of results can be found in Results Review.  Recommendations: No changes.   Encouraged to call for fluid symptoms.  Follow-up plan: ICM clinic phone appointment on 09/02/2018.     Copy of ICM check sent to Dr. [name]   3 month ICM trend: 08/01/2018    1 Year ICM trend:       [name] RN 08/02/2018 11:42 AM

## 2018-08-08 ENCOUNTER — Telehealth: Payer: Self-pay | Admitting: *Deleted

## 2018-08-08 NOTE — Telephone Encounter (Signed)
Faxed Dr Alford Highland 09/25/17 office note (for the 3rd time) to Angie @ [facility].

## 2018-08-10 ENCOUNTER — Other Ambulatory Visit (HOSPITAL_COMMUNITY): Payer: Self-pay | Admitting: Cardiology

## 2018-08-12 ENCOUNTER — Other Ambulatory Visit (HOSPITAL_COMMUNITY): Payer: Self-pay | Admitting: Pharmacist

## 2018-08-12 MED ORDER — TORSEMIDE 20 MG PO TABS: 60.0000 mg | ORAL_TABLET | Freq: Two times a day (BID) | ORAL | 5 refills | Status: DC

## 2018-08-19 ENCOUNTER — Other Ambulatory Visit (HOSPITAL_COMMUNITY): Payer: Self-pay | Admitting: Cardiology

## 2018-09-02 ENCOUNTER — Ambulatory Visit (INDEPENDENT_AMBULATORY_CARE_PROVIDER_SITE_OTHER): Payer: Medicare Other

## 2018-09-02 ENCOUNTER — Other Ambulatory Visit (HOSPITAL_COMMUNITY): Payer: Self-pay | Admitting: Cardiology

## 2018-09-02 DIAGNOSIS — I5022 Chronic systolic (congestive) heart failure: Secondary | ICD-10-CM

## 2018-09-02 DIAGNOSIS — Z95810 Presence of automatic (implantable) cardiac defibrillator: Secondary | ICD-10-CM | POA: Diagnosis not present

## 2018-09-03 NOTE — Progress Notes (Signed)
EPIC Encounter for ICM Monitoring  Patient Name: [name] [name] is a 57 y.o. male Date: 09/03/2018 Primary Care Physican: [name], MD Primary Cardiologist:[name] Electrophysiologist:[name] Bi-V Pacing:98.7% Last Weight: 310lbs  Today's Weight: unknown                                                    Heart Failure questions reviewed, pt asymptomatic.  He feels weight gain is from eating not fluid.   Thoracic impedance normal.   Prescribed: Torsemide20 mg 3 tablets (60 mg total) twice a day. Potassium 43mq 2 tablets (40 mEq total) twice a day.  Labs: 09/25/2017 Creatinine1.23,[name] Potassium4.2, [id number] EGFR>60 08/13/2018 Creatinine1.19, BUN16, Potassium4.4, Sodium136, EGFR>60 A complete set of results can be found in Results Review.  Recommendations: No changes.   Encouraged to call for fluid symptoms.  Follow-up plan: ICM clinic phone appointment on 11/04/2018.  Office visit with AChanetta Marshall NP on 10/02/2018.     Copy of ICM check sent to Dr. [name]   3 month ICM trend: 09/02/2018    1 Year ICM trend:       [name] RN 09/03/2018 7:54 AM

## 2018-09-24 ENCOUNTER — Other Ambulatory Visit: Payer: Self-pay | Admitting: Internal Medicine

## 2018-09-30 ENCOUNTER — Ambulatory Visit (INDEPENDENT_AMBULATORY_CARE_PROVIDER_SITE_OTHER): Payer: Medicare Other

## 2018-09-30 DIAGNOSIS — I5022 Chronic systolic (congestive) heart failure: Secondary | ICD-10-CM

## 2018-09-30 DIAGNOSIS — I428 Other cardiomyopathies: Secondary | ICD-10-CM | POA: Diagnosis not present

## 2018-10-01 LAB — CUP PACEART REMOTE DEVICE CHECK
Battery Remaining Longevity: 29 mo
Battery Voltage: 2.95 V
Brady Statistic AP VP Percent: 53.93 %
Brady Statistic AP VS Percent: 0.23 %
Brady Statistic AS VS Percent: 1.21 %
Brady Statistic RA Percent Paced: 53.25 %
Brady Statistic RV Percent Paced: 96.98 %
Date Time Interrogation Session: 20200113051704
HIGH POWER IMPEDANCE MEASURED VALUE: 77 Ohm
Implantable Lead Implant Date: 20160727
Implantable Lead Implant Date: 20160727
Implantable Lead Implant Date: 20160727
Implantable Lead Location: 753858
Implantable Lead Location: 753859
Implantable Lead Location: 753860
Implantable Lead Model: 4598
Implantable Lead Model: 6935
Implantable Pulse Generator Implant Date: 20160727
Lead Channel Impedance Value: 342 Ohm
Lead Channel Impedance Value: 342 Ohm
Lead Channel Impedance Value: 399 Ohm
Lead Channel Impedance Value: 418 Ohm
Lead Channel Impedance Value: 456 Ohm
Lead Channel Impedance Value: 513 Ohm
Lead Channel Impedance Value: 551 Ohm
Lead Channel Impedance Value: 589 Ohm
Lead Channel Impedance Value: 589 Ohm
Lead Channel Impedance Value: 608 Ohm
Lead Channel Impedance Value: 608 Ohm
Lead Channel Pacing Threshold Amplitude: 0.5 V
Lead Channel Pacing Threshold Amplitude: 1.5 V
Lead Channel Pacing Threshold Pulse Width: 0.4 ms
Lead Channel Pacing Threshold Pulse Width: 0.6 ms
Lead Channel Sensing Intrinsic Amplitude: 1.875 mV
Lead Channel Sensing Intrinsic Amplitude: 14.375 mV
Lead Channel Sensing Intrinsic Amplitude: 14.375 mV
Lead Channel Setting Pacing Amplitude: 1.5 V
Lead Channel Setting Pacing Amplitude: 2 V
Lead Channel Setting Pacing Amplitude: 2.5 V
Lead Channel Setting Pacing Pulse Width: 0.4 ms
Lead Channel Setting Pacing Pulse Width: 0.6 ms
Lead Channel Setting Sensing Sensitivity: 0.3 mV
MDC IDC MSMT LEADCHNL LV IMPEDANCE VALUE: 361 Ohm
MDC IDC MSMT LEADCHNL RA IMPEDANCE VALUE: 456 Ohm
MDC IDC MSMT LEADCHNL RA SENSING INTR AMPL: 1.875 mV
MDC IDC MSMT LEADCHNL RV PACING THRESHOLD AMPLITUDE: 0.5 V
MDC IDC MSMT LEADCHNL RV PACING THRESHOLD PULSEWIDTH: 0.4 ms
MDC IDC STAT BRADY AS VP PERCENT: 44.63 %

## 2018-10-01 NOTE — Progress Notes (Signed)
Electrophysiology Office Note Date: 10/02/2018  ID:  [name] [name], DOB 1961/08/10, MRN [id number]  PCP: [name], MD Primary Cardiologist: [name] Electrophysiologist: Graciela Husbands  CC: Routine ICD follow-up  [name] [name] is a 58 y.o. male seen today for Dr Graciela Husbands.  He presents today for routine electrophysiology followup.  Since last being seen in our clinic, the patient reports doing relatively well.  He is having trouble affording Xarelto and Entresto (our team is helping with patient assistance).  He is also pending hip and knee replacement surgery with Dr Gean Birchwood.  He denies chest pain, palpitations, dyspnea (above baseline), PND, orthopnea, nausea, vomiting, dizziness, syncope, edema, weight gain, or early satiety.  He has not had ICD shocks.   Device History: MDT CRTD implanted 2016 for CHF History of appropriate therapy: Yes History of AAD therapy: Yes - amiodarone, sotalol   Past Medical History:  Diagnosis Date  . CAD (coronary artery disease)    Mild nonobstructive (Cath 09)  . Chronic systolic heart failure (HCC)    Nonischemic CM: echo 4/12 with EF 25-30%, grade 2 diast dysfxn, mild dilated aortic root 43 mm, trivial MR, mod LAE  . COPD (chronic obstructive pulmonary disease) (HCC)   . Dyspnea   . H/O ETOH abuse   . HTN (hypertension)   . Hypersomnia   . Obesity, morbid (HCC)   . OSA on CPAP   . Systolic and diastolic CHF, acute on chronic (HCC) 11/2016  . Tobacco use disorder   . Ventricular tachycardia [facility])    Past Surgical History:  Procedure Laterality Date  . CARDIAC CATHETERIZATION N/A 04/08/2015   Procedure: Right/Left Heart Cath and Coronary Angiography;  Surgeon: Corky Crafts, MD;  Location: [facility] INVASIVE CV LAB;  Service: Cardiovascular;  Laterality: N/A;  . EP IMPLANTABLE DEVICE N/A 04/14/2015   Procedure: BiV ICD Insertion CRT-D;  Surgeon: [name], MD;  Location: [facility] INVASIVE CV LAB;  Service: Cardiovascular;  Laterality: N/A;    . HYDROCELE EXCISION / REPAIR    . TUMOR EXCISION Left   . 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 VASECTOMY      Current Outpatient Medications  Medication Sig Dispense Refill  . acetaminophen (TYLENOL) 325 MG tablet Take 2 tablets (650 mg total) by mouth every 4 (four) hours as needed for headache or mild pain.    Marland Kitchen albuterol (PROVENTIL HFA;VENTOLIN HFA) 108 (90 Base) MCG/ACT inhaler Inhale 1 puff into the lungs every 6 (six) hours as needed for wheezing or shortness of breath. 1 Inhaler 1  . amiodarone (PACERONE) 200 MG tablet TAKE 1 TABLET BY MOUTH ONCE DAILY 30 tablet 2  . atorvastatin (LIPITOR) 80 MG tablet Take 1 tablet (80 mg total) by mouth daily at 6 PM. 30 tablet 11  . FLUoxetine (PROZAC) 20 MG tablet Take 20 mg by mouth daily.    . potassium chloride SA (K-DUR,KLOR-CON) 20 MEQ tablet TAKE 2 TABLETS BY MOUTH TWICE DAILY 120 tablet 5  . rivaroxaban (XARELTO) 20 MG TABS tablet Take 1 tablet (20 mg total) by mouth daily. 30 tablet 11  . sacubitril-valsartan (ENTRESTO) 49-51 MG Take 1 tablet by mouth 2 (two) times daily. 60 tablet 5  . sotalol (BETAPACE) 80 MG tablet Take 1 tablet (80 mg total) by mouth every 12 (twelve) hours. Please keep upcoming appt for future refills. Thank you. 60 tablet 0  . spironolactone (ALDACTONE) 25 MG tablet TAKE 1 TABLET BY MOUTH ONCE DAILY 30  tablet 5  . torsemide (DEMADEX) 20 MG tablet Take 3 tablets (60 mg total) by mouth 2 (two) times daily. 180 tablet 5   No current facility-administered medications for this visit.     Allergies:   Patient has no known allergies.   Social History: Social History   Socioeconomic History  . Marital status: Divorced    Spouse name: Not on file  . Number of children: 2  . Years of education: Not on file  . Highest education level: Not on file  Occupational History  . Occupation: Copy  . Occupation: disabled, odd jobs  Social Needs  . Financial resource strain: Not on file  .  Food insecurity:    Worry: Not on file    Inability: Not on file  . Transportation needs:    Medical: Not on file    Non-medical: Not on file  Tobacco Use  . Smoking status: Former Smoker    Packs/day: 0.50    Years: 4.00    Pack years: 2.00    Types: Cigarettes  . Smokeless tobacco: Never Used  . Tobacco comment: about a pack a week. social  Substance and Sexual Activity  . Alcohol use: Yes    Alcohol/week: 42.0 standard drinks    Types: 42 Cans of beer per week    Comment: 6 beers per day  . Drug use: Yes    Frequency: 7.0 times per week    Types: Codeine, Marijuana    Comment: has discontinued  . Sexual activity: Yes    Partners: Female  Lifestyle  . Physical activity:    Days per week: Not on file    Minutes per session: Not on file  . Stress: Not on file  Relationships  . Social connections:    Talks on phone: Not on file    Gets together: Not on file    Attends religious service: Not on file    Active member of club or organization: Not on file    Attends meetings of clubs or organizations: Not on file    Relationship status: Not on file  . Intimate partner violence:    Fear of current or ex partner: Not on file    Emotionally abused: Not on file    Physically abused: Not on file    Forced sexual activity: Not on file  Other Topics Concern  . Not on file  Social History Narrative   ** Merged History Encounter **        Family History: Family History  Problem Relation Age of Onset  . Other Mother        cardiac surgery. late 1990s  . Heart Problems Mother        CABG AGE 19  . Congestive Heart Failure Father   . Healthy Father        AGE 47  . Coronary artery disease Unknown   . Healthy Brother        AGE 33  . Healthy Sister        AGE 63  . Healthy Sister        AGE 28  . Healthy Son   . Healthy Son   . Healthy Daughter     Review of Systems: All other systems reviewed and are otherwise negative except as noted above.   Physical  Exam: VS:  BP 132/86   Pulse 82   Ht 6\' 4"  (1.93 m)   Wt (!) 320 lb (145.2 kg)  BMI 38.95 kg/m  , BMI Body mass index is 38.95 kg/m.  GEN- The patient is elderly and obese appearing, alert and oriented x 3 today.   HEENT: normocephalic, atraumatic; sclera clear, conjunctiva pink; hearing intact; oropharynx clear; neck supple  Lungs- Clear to ausculation bilaterally, normal work of breathing.  No wheezes, rales, rhonchi Heart- Regular rate and rhythm (paced) GI- soft, non-tender, non-distended, bowel sounds present Extremities- no clubbing, cyanosis, or edema  MS- no significant deformity or atrophy Skin- warm and dry, no rash or lesion; ICD pocket well healed Psych- euthymic mood, full affect Neuro- strength and sensation are intact  ICD interrogation- reviewed in detail today,  See PACEART report  EKG:  EKG is ordered today. EKG today demonstrates AV pacing, QTc (stable), QRS  Recent Labs: No results found for requested labs within last 8760 hours.   Wt Readings from Last 3 Encounters:  10/02/18 (!) 320 lb (145.2 kg)  10/30/17 295 lb (133.8 kg)  10/18/17 296 lb (134.3 kg)     Other studies Reviewed: Additional studies/ records that were reviewed today include: Dr Odessa Fleming office notes   Assessment and Plan:  1.  Chronic systolic dysfunction euvolemic today Stable on an appropriate medical regimen Normal ICD function See Pace Art report No changes today  2.  VT Stable No change required today Continue amiodarone/sotalol QTc stable BMET, Mg today  3.  Paroxysmal atrial fibrillation Burden by device interrogation <1% Continue Xarelto for CHADS2VASC of 2  4.  Surgical clearance Pt is pending hip and knee replacement with Dr Turner Daniels With cardiomyopathy and VT, he is at higher risk from a cardiac standpoint for any procedure - we discussed today Perioperative risk of major cardiac event is 0.9% Functional mets is 5.84   Current medicines are  reviewed at length with the patient today.   The patient does not have concerns regarding his medicines.  The following changes were made today:  none  Labs/ tests ordered today include: none No orders of the defined types were placed in this encounter.    Disposition:   Follow up with [facility], Dr Graciela Husbands 6 months   Signed, Gypsy Balsam, NP 10/02/2018 10:44 AM  [facility] [address] [place] [address] [phone] (office) [phone] (fax)

## 2018-10-01 NOTE — Progress Notes (Signed)
Remote ICD transmission.   

## 2018-10-02 ENCOUNTER — Encounter: Payer: Self-pay | Admitting: Nurse Practitioner

## 2018-10-02 ENCOUNTER — Ambulatory Visit (INDEPENDENT_AMBULATORY_CARE_PROVIDER_SITE_OTHER): Payer: Medicare Other | Admitting: Nurse Practitioner

## 2018-10-02 VITALS — BP 132/86 | HR 82 | Ht 76.0 in | Wt 320.0 lb

## 2018-10-02 DIAGNOSIS — I428 Other cardiomyopathies: Secondary | ICD-10-CM | POA: Diagnosis not present

## 2018-10-02 DIAGNOSIS — I480 Paroxysmal atrial fibrillation: Secondary | ICD-10-CM | POA: Diagnosis not present

## 2018-10-02 DIAGNOSIS — I472 Ventricular tachycardia, unspecified: Secondary | ICD-10-CM

## 2018-10-02 DIAGNOSIS — I5022 Chronic systolic (congestive) heart failure: Secondary | ICD-10-CM

## 2018-10-02 LAB — CUP PACEART INCLINIC DEVICE CHECK
Date Time Interrogation Session: 20200115110132
Implantable Lead Implant Date: 20160727
Implantable Lead Implant Date: 20160727
Implantable Lead Implant Date: 20160727
Implantable Lead Location: 753858
Implantable Lead Location: 753859
Implantable Lead Location: 753860
Implantable Lead Model: 4598
Implantable Lead Model: 5076
Implantable Pulse Generator Implant Date: 20160727

## 2018-10-02 NOTE — Patient Instructions (Signed)
Medication Instructions:  NONE If you need a refill on your cardiac medications before your next appointment, please call your pharmacy.   Lab work:TODAY BMET CBC MAGNESIUM  If you have labs (blood work) drawn today and your tests are completely normal, you will receive your results only by: Marland Kitchen MyChart Message (if you have MyChart) OR . A paper copy in the mail If you have any lab test that is abnormal or we need to change your treatment, we will call you to review the results.  Testing/Procedures: NONE  Follow-Up: At [facility], you and your health needs are our priority.  As part of our continuing mission to provide you with exceptional heart care, we have created designated Provider Care Teams.  These Care Teams include your primary Cardiologist (physician) and Advanced Practice Providers (APPs -  Physician Assistants and Nurse Practitioners) who all work together to provide you with the care you need, when you need it. You will need a follow up appointment in 6 months.  Please call our office 2 months in advance to schedule this appointment.  You may see Dr Graciela Husbands or one of the following Advanced Practice Providers on your designated Care Team:   Gypsy Balsam, NP . Francis Dowse, PA-C  Any Other Special Instructions Will Be Listed Below (If Applicable). Remote monitoring is used to monitor your ICD from home. This monitoring reduces the number of office visits required to check your device to one time per year. It allows [place] to keep an eye on the functioning of your device to ensure it is working properly. You are scheduled for a device check from home on 11/04/2018. You may send your transmission at any time that day. If you have a wireless device, the transmission will be sent automatically. After your physician reviews your transmission, you will receive a postcard with your next transmission date.

## 2018-10-03 ENCOUNTER — Telehealth: Payer: Self-pay

## 2018-10-03 LAB — BASIC METABOLIC PANEL
BUN/Creatinine Ratio: 15 (ref 9–20)
BUN: 15 mg/dL (ref 6–24)
CHLORIDE: 99 mmol/L (ref 96–106)
CO2: 25 mmol/L (ref 20–29)
Calcium: 9.6 mg/dL (ref 8.7–10.2)
Creatinine, Ser: 0.97 mg/dL (ref 0.76–1.27)
GFR calc Af Amer: 101 mL/min/{1.73_m2} (ref >59)
GFR calc non Af Amer: 88 mL/min/{1.73_m2} (ref >59)
Glucose: 89 mg/dL (ref 65–99)
Potassium: 4.4 mmol/L (ref 3.5–5.2)
Sodium: 140 mmol/L (ref 134–144)

## 2018-10-03 LAB — CBC
Hematocrit: 35.2 % — ABNORMAL LOW (ref 37.5–51.0)
Hemoglobin: 12.5 g/dL — ABNORMAL LOW (ref 13.0–17.7)
MCH: 33.9 pg — ABNORMAL HIGH (ref 26.6–33.0)
MCHC: 35.5 g/dL (ref 31.5–35.7)
MCV: 95 fL (ref 79–97)
PLATELETS: 200 10*3/uL (ref 150–450)
RBC: 3.69 x10E6/uL — AB (ref 4.14–5.80)
RDW: 13 % (ref 11.6–15.4)
WBC: 4.8 10*3/uL (ref 3.4–10.8)

## 2018-10-03 LAB — MAGNESIUM: Magnesium: 2.4 mg/dL — ABNORMAL HIGH (ref 1.6–2.3)

## 2018-10-03 NOTE — Telephone Encounter (Signed)
-----   Message from Amber K Seiler, NP sent at 10/03/2018  6:29 AM EST ----- Please notify patient of stable labs. Thanks! 

## 2018-10-03 NOTE — Telephone Encounter (Signed)
Notes recorded by Sigurd Sos, RN on 10/03/2018 at 10:43 AM EST The patient has been notified of the result and verbalized understanding. All questions (if any) were answered. Sigurd Sos, RN 10/03/2018 10:43 AM

## 2018-10-03 NOTE — Telephone Encounter (Signed)
Notes recorded by Sigurd Sos, RN on 10/03/2018 at 10:20 AM EST [email or web address] 1/16 ------

## 2018-10-04 ENCOUNTER — Telehealth: Payer: Self-pay

## 2018-10-04 NOTE — Telephone Encounter (Signed)
Per Dr Graciela Husbands, he will be unable to sign pt's disability tax form. He advised pt to speak with his primary care physician or the physician who approved his disability. Pt has verbalized understanding and had no additional questions.

## 2018-10-04 NOTE — Telephone Encounter (Signed)
LVM for return call to discuss form that pt dropped off for signatures.

## 2018-10-10 ENCOUNTER — Encounter (HOSPITAL_COMMUNITY): Payer: Self-pay | Admitting: *Deleted

## 2018-10-14 ENCOUNTER — Telehealth: Payer: Self-pay | Admitting: Internal Medicine

## 2018-10-14 NOTE — Telephone Encounter (Signed)
     [facility] Pre-operative Risk Assessment    Request for surgical clearance:  1. What type of surgery is being performed? Total right hip replacement  2. When is this surgery scheduled? 11/04/18  3. What type of clearance is required (medical clearance vs. Pharmacy clearance to hold med vs. Both)? Both  4. Are there any medications that need to be held prior to surgery and how long? Xarelto  5. Practice name and name of physician performing surgery? [facility], Dr. Dorna Leitz  6. What is your office phone number [phone]   7.   What is your office fax number [phone]  8.   Anesthesia type (None, local, MAC, general) ? Spinal   [name] [name] 10/14/2018, 11:07 AM  _________________________________________________________________   (provider comments below)

## 2018-10-14 NOTE — Telephone Encounter (Signed)
Patient with diagnosis of atrial fibrillation on Xarelto for anticoagulation.    Procedure: total right hip replacement  Date of procedure: 11/04/2018  CHADS2-VASc score of  3 (CHF, HTN, , CAD, )  CrCl 176 Platelet count 200  Per office protocol, patient can hold Xarelto for 3 days prior to procedure.    For orthopedic procedures please be sure to resume therapeutic (not prophylactic) dosing.

## 2018-10-14 NOTE — Telephone Encounter (Signed)
   Primary Cardiologist: Arvilla Meres, MD; Sherryl Manges, MD  Chart reviewed as part of pre-operative protocol coverage. Patient was seen by Gypsy Balsam, NP 10/02/2018 and was doing well from a cardiac standpoint at that time.   Per surgical clearance noted by Gypsy Balsam, NP,  based on ACC/AHA guidelines, [name] [name] would be at acceptable risk for the planned procedure without further cardiovascular testing.   Pharmacy, can you comment on this patient's xarelto for paroxysmal atrial fibrillation?  Once I hear back from pharmacy, will route this recommendation and Amber Seiler's note to the requesting party via Epic fax function and remove from pre-op pool.   Beatriz Stallion, PA-C 10/14/2018, 11:53 AM

## 2018-10-18 ENCOUNTER — Other Ambulatory Visit (HOSPITAL_COMMUNITY): Payer: Self-pay | Admitting: Cardiology

## 2018-10-18 ENCOUNTER — Other Ambulatory Visit: Payer: Self-pay | Admitting: Internal Medicine

## 2018-10-18 ENCOUNTER — Other Ambulatory Visit: Payer: Self-pay | Admitting: Orthopedic Surgery

## 2018-10-18 NOTE — Telephone Encounter (Signed)
   Primary Cardiologist: Dr. Graciela Husbands  Chart reviewed as part of pre-operative protocol coverage. Pre-op clearance already addressed by colleagues in earlier phone notes. To summarize recommendations:  -Pt has been cleared for surgery.  -Per office protocol, patient can hold Xarelto for 3 days prior to procedure.   For orthopedic procedures please be sure to resume therapeutic (not prophylactic) dosing.   Will route this bundled recommendation to requesting provider via Epic fax function. Please call with questions.  Robbie Lis, PA-C 10/18/2018, 10:27 AM

## 2018-10-22 ENCOUNTER — Other Ambulatory Visit (HOSPITAL_COMMUNITY): Payer: Self-pay | Admitting: Cardiology

## 2018-10-25 ENCOUNTER — Other Ambulatory Visit (HOSPITAL_COMMUNITY): Payer: Self-pay | Admitting: Cardiology

## 2018-10-28 ENCOUNTER — Encounter (HOSPITAL_COMMUNITY): Payer: Self-pay

## 2018-10-28 NOTE — Patient Instructions (Signed)
[name] [name]  10/28/2018   Your procedure is scheduled on: 11-04-18  Report to [facility] Main  Entrance            Report to admitting at       0730 AM    Call this number if you have problems the morning of surgery [phone]    Remember: Do not eat food or drink liquids :After Midnight. BRUSH YOUR TEETH MORNING OF SURGERY AND RINSE YOUR MOUTH OUT, NO CHEWING GUM CANDY OR MINTS.     Take these medicines the morning of surgery with A SIP OF WATER: sotalol, prozac, amiodarone, inhaler and bring with you                               You may not have any metal on your body including hair pins and              piercings  Do not wear jewelry,  lotions, powders or perfumes, deodorant                   Men may shave face and neck.   Do not bring valuables to the hospital.  IS NOT             RESPONSIBLE   FOR VALUABLES.  Contacts, dentures or bridgework may not be worn into surgery.  Leave suitcase in the car. After surgery it may be brought to your room.                  Please read over the following fact sheets you were given: _____________________________________________________________________           [facility] - Preparing for Surgery Before surgery, you can play an important role.  Because skin is not sterile, your skin needs to be as free of germs as possible.  You can reduce the number of germs on your skin by washing with CHG (chlorahexidine gluconate) soap before surgery.  CHG is an antiseptic cleaner which kills germs and bonds with the skin to continue killing germs even after washing. Please DO NOT use if you have an allergy to CHG or antibacterial soaps.  If your skin becomes reddened/irritated stop using the CHG and inform your nurse when you arrive at Short Stay. Do not shave (including legs and underarms) for at least 48 hours prior to the first CHG shower.  You may shave your face/neck. Please follow these instructions  carefully:  1.  Shower with CHG Soap the night before surgery and the  morning of Surgery.  2.  If you choose to wash your hair, wash your hair first as usual with your  normal  shampoo.  3.  After you shampoo, rinse your hair and body thoroughly to remove the  shampoo.                           4.  Use CHG as you would any other liquid soap.  You can apply chg directly  to the skin and wash                       Gently with a scrungie or clean washcloth.  5.  Apply the CHG Soap to your body ONLY FROM THE NECK DOWN.  Do not use on face/ open                           Wound or open sores. Avoid contact with eyes, ears mouth and genitals (private parts).                       Wash face,  Genitals (private parts) with your normal soap.             6.  Wash thoroughly, paying special attention to the area where your surgery  will be performed.  7.  Thoroughly rinse your body with warm water from the neck down.  8.  DO NOT shower/wash with your normal soap after using and rinsing off  the CHG Soap.                9.  Pat yourself dry with a clean towel.            10.  Wear clean pajamas.            11.  Place clean sheets on your bed the night of your first shower and do not  sleep with pets. Day of Surgery : Do not apply any lotions/deodorants the morning of surgery.  Please wear clean clothes to the hospital/surgery center.  FAILURE TO FOLLOW THESE INSTRUCTIONS MAY RESULT IN THE CANCELLATION OF YOUR SURGERY PATIENT SIGNATURE_________________________________  NURSE SIGNATURE__________________________________  ________________________________________________________________________  WHAT IS A BLOOD TRANSFUSION? Blood Transfusion Information  A transfusion is the replacement of blood or some of its parts. Blood is made up of multiple cells which provide different functions.  Red blood cells carry oxygen and are used for blood loss replacement.  White blood cells fight against  infection.  Platelets control bleeding.  Plasma helps clot blood.  Other blood products are available for specialized needs, such as hemophilia or other clotting disorders. BEFORE THE TRANSFUSION  Who gives blood for transfusions?   Healthy volunteers who are fully evaluated to make sure their blood is safe. This is blood bank blood. Transfusion therapy is the safest it has ever been in the practice of medicine. Before blood is taken from a donor, a complete history is taken to make sure that person has no history of diseases nor engages in risky social behavior (examples are intravenous drug use or sexual activity with multiple partners). The donor's travel history is screened to minimize risk of transmitting infections, such as malaria. The donated blood is tested for signs of infectious diseases, such as HIV and hepatitis. The blood is then tested to be sure it is compatible with you in order to minimize the chance of a transfusion reaction. If you or a relative donates blood, this is often done in anticipation of surgery and is not appropriate for emergency situations. It takes many days to process the donated blood. RISKS AND COMPLICATIONS Although transfusion therapy is very safe and saves many lives, the main dangers of transfusion include:   Getting an infectious disease.  Developing a transfusion reaction. This is an allergic reaction to something in the blood you were given. Every precaution is taken to prevent this. The decision to have a blood transfusion has been considered carefully by your caregiver before blood is given. Blood is not given unless the benefits outweigh the risks. AFTER THE TRANSFUSION  Right after receiving a blood transfusion, you will usually feel much better and more energetic. This is especially  true if your red blood cells have gotten low (anemic). The transfusion raises the level of the red blood cells which carry oxygen, and this usually causes an energy  increase.  The nurse administering the transfusion will monitor you carefully for complications. HOME CARE INSTRUCTIONS  No special instructions are needed after a transfusion. You may find your energy is better. Speak with your caregiver about any limitations on activity for underlying diseases you may have. SEEK MEDICAL CARE IF:   Your condition is not improving after your transfusion.  You develop redness or irritation at the intravenous (IV) site. SEEK IMMEDIATE MEDICAL CARE IF:  Any of the following symptoms occur over the next 12 hours:  Shaking chills.  You have a temperature by mouth above 102 F (38.9 C), not controlled by medicine.  Chest, back, or muscle pain.  People around you feel you are not acting correctly or are confused.  Shortness of breath or difficulty breathing.  Dizziness and fainting.  You get a rash or develop hives.  You have a decrease in urine output.  Your urine turns a dark color or changes to pink, red, or brown. Any of the following symptoms occur over the next 10 days:  You have a temperature by mouth above 102 F (38.9 C), not controlled by medicine.  Shortness of breath.  Weakness after normal activity.  The white part of the eye turns yellow (jaundice).  You have a decrease in the amount of urine or are urinating less often.  Your urine turns a dark color or changes to pink, red, or brown. Document Released: 09/01/2000 Document Revised: 11/27/2011 Document Reviewed: 04/20/2008 ExitCare Patient Information 2014 [place].  _______________________________________________________________________  Incentive Spirometer  An incentive spirometer is a tool that can help keep your lungs clear and active. This tool measures how well you are filling your lungs with each breath. Taking long deep breaths may help reverse or decrease the chance of developing breathing (pulmonary) problems (especially infection) following:  A long  period of time when you are unable to move or be active. BEFORE THE PROCEDURE   If the spirometer includes an indicator to show your best effort, your nurse or respiratory therapist will set it to a desired goal.  If possible, sit up straight or lean slightly forward. Try not to slouch.  Hold the incentive spirometer in an upright position. INSTRUCTIONS FOR USE  1. Sit on the edge of your bed if possible, or sit up as far as you can in bed or on a chair. 2. Hold the incentive spirometer in an upright position. 3. Breathe out normally. 4. Place the mouthpiece in your mouth and seal your lips tightly around it. 5. Breathe in slowly and as deeply as possible, raising the piston or the ball toward the top of the column. 6. Hold your breath for 3-5 seconds or for as long as possible. Allow the piston or ball to fall to the bottom of the column. 7. Remove the mouthpiece from your mouth and breathe out normally. 8. Rest for a few seconds and repeat Steps 1 through 7 at least 10 times every 1-2 hours when you are awake. Take your time and take a few normal breaths between deep breaths. 9. The spirometer may include an indicator to show your best effort. Use the indicator as a goal to work toward during each repetition. 10. After each set of 10 deep breaths, practice coughing to be sure your lungs are clear. If you have  an incision (the cut made at the time of surgery), support your incision when coughing by placing a pillow or rolled up towels firmly against it. Once you are able to get out of bed, walk around indoors and cough well. You may stop using the incentive spirometer when instructed by your caregiver.  RISKS AND COMPLICATIONS  Take your time so you do not get dizzy or light-headed.  If you are in pain, you may need to take or ask for pain medication before doing incentive spirometry. It is harder to take a deep breath if you are having pain. AFTER USE  Rest and breathe slowly and  easily.  It can be helpful to keep track of a log of your progress. Your caregiver can provide you with a simple table to help with this. If you are using the spirometer at home, follow these instructions: [place] IF:   You are having difficultly using the spirometer.  You have trouble using the spirometer as often as instructed.  Your pain medication is not giving enough relief while using the spirometer.  You develop fever of 100.5 F (38.1 C) or higher. SEEK IMMEDIATE MEDICAL CARE IF:   You cough up bloody sputum that had not been present before.  You develop fever of 102 F (38.9 C) or greater.  You develop worsening pain at or near the incision site. MAKE SURE YOU:   Understand these instructions.  Will watch your condition.  Will get help right away if you are not doing well or get worse. Document Released: 01/15/2007 Document Revised: 11/27/2011 Document Reviewed: 03/18/2007 [facility] Patient Information 2014 [place], [place].   ________________________________________________________________________

## 2018-10-28 NOTE — Progress Notes (Signed)
ICD orders on chart  Echo 07-30-17 in epic ef 20%  ekg 10-02-18 epic  Last device check 10-02-18  Epic  LOV 10-02-18 cardiology  Clearance note in epic 11-04-18 Boyce Medici PA-C in epic

## 2018-10-29 ENCOUNTER — Other Ambulatory Visit: Payer: Self-pay

## 2018-10-29 ENCOUNTER — Ambulatory Visit (HOSPITAL_COMMUNITY)
Admission: RE | Admit: 2018-10-29 | Discharge: 2018-10-29 | Disposition: A | Discharge status: Home / Self Care | Payer: Medicare Other | Source: Ambulatory Visit | Attending: Orthopedic Surgery | Admitting: Orthopedic Surgery

## 2018-10-29 ENCOUNTER — Encounter (HOSPITAL_COMMUNITY): Payer: Self-pay

## 2018-10-29 ENCOUNTER — Encounter (HOSPITAL_COMMUNITY)
Admission: RE | Admit: 2018-10-29 | Discharge: 2018-10-29 | Disposition: A | Discharge status: Home / Self Care | Payer: Medicare Other | Source: Ambulatory Visit | Attending: Orthopedic Surgery | Admitting: Orthopedic Surgery

## 2018-10-29 DIAGNOSIS — Z01811 Encounter for preprocedural respiratory examination: Secondary | ICD-10-CM

## 2018-10-29 HISTORY — DX: Presence of automatic (implantable) cardiac defibrillator: Z95.810

## 2018-10-29 HISTORY — DX: Unspecified osteoarthritis, unspecified site: M19.90

## 2018-10-29 LAB — COMPREHENSIVE METABOLIC PANEL
ALT: 26 U/L (ref 0–44)
AST: 24 U/L (ref 15–41)
Albumin: 4.5 g/dL (ref 3.5–5.0)
Alkaline Phosphatase: 81 U/L (ref 38–126)
Anion gap: 10 (ref 5–15)
BUN: 16 mg/dL (ref 6–20)
CO2: 27 mmol/L (ref 22–32)
Calcium: 9.1 mg/dL (ref 8.9–10.3)
Chloride: 101 mmol/L (ref 98–111)
Creatinine, Ser: 0.94 mg/dL (ref 0.61–1.24)
GFR calc Af Amer: >60 mL/min (ref >60)
GFR calc non Af Amer: >60 mL/min (ref >60)
Glucose, Bld: 108 mg/dL — ABNORMAL HIGH (ref 70–99)
Potassium: 3.7 mmol/L (ref 3.5–5.1)
SODIUM: 138 mmol/L (ref 135–145)
Total Bilirubin: 1.4 mg/dL — ABNORMAL HIGH (ref 0.3–1.2)
Total Protein: 7 g/dL (ref 6.5–8.1)

## 2018-10-29 LAB — CBC WITH DIFFERENTIAL/PLATELET
Abs Immature Granulocytes: 0.02 10*3/uL (ref 0.00–0.07)
Basophils Absolute: 0 10*3/uL (ref 0.0–0.1)
Basophils Relative: 0 %
Eosinophils Absolute: 0.2 10*3/uL (ref 0.0–0.5)
Eosinophils Relative: 4 %
HCT: 38 % — ABNORMAL LOW (ref 39.0–52.0)
Hemoglobin: 12.9 g/dL — ABNORMAL LOW (ref 13.0–17.0)
Immature Granulocytes: 0 %
Lymphocytes Relative: 29 %
Lymphs Abs: 1.3 10*3/uL (ref 0.7–4.0)
MCH: 33.9 pg (ref 26.0–34.0)
MCHC: 33.9 g/dL (ref 30.0–36.0)
MCV: 99.7 fL (ref 80.0–100.0)
Monocytes Absolute: 0.3 10*3/uL (ref 0.1–1.0)
Monocytes Relative: 7 %
NEUTROS ABS: 2.7 10*3/uL (ref 1.7–7.7)
Neutrophils Relative %: 60 %
Platelets: 175 10*3/uL (ref 150–400)
RBC: 3.81 MIL/uL — AB (ref 4.22–5.81)
RDW: 13.2 % (ref 11.5–15.5)
WBC: 4.5 10*3/uL (ref 4.0–10.5)
nRBC: 0 % (ref 0.0–0.2)

## 2018-10-29 LAB — URINALYSIS, ROUTINE W REFLEX MICROSCOPIC
Bilirubin Urine: NEGATIVE
Glucose, UA: NEGATIVE mg/dL
Hgb urine dipstick: NEGATIVE
Ketones, ur: NEGATIVE mg/dL
Leukocytes,Ua: NEGATIVE
NITRITE: NEGATIVE
Protein, ur: NEGATIVE mg/dL
Specific Gravity, Urine: 1.006 (ref 1.005–1.030)
pH: 7 (ref 5.0–8.0)

## 2018-10-29 LAB — APTT: aPTT: 27 seconds (ref 24–36)

## 2018-10-29 LAB — PROTIME-INR
INR: 0.91
Prothrombin Time: 12.2 seconds (ref 11.4–15.2)

## 2018-10-29 NOTE — Progress Notes (Addendum)
PCP:  Windle Guard  CARDIOLOGIST:  Boyce Medici PA-C  INFO IN Epic:  INFO ON CHART:  BLOOD THINNERS AND LAST DOSES:xarelto instructed to hold 3 days prior to surgery____________________________________  PATIENT SYMPTOMS AT TIME OF PREOP: NONE  Extensive HX.   :ICD orders on chart  Echo 07-30-17 in epic ef 20%  ekg 10-02-18 epic  Last device check 10-02-18  Epic  LOV 10-02-18 cardiology  Clearance note in epic 11-04-18 Boyce Medici PA-C in epic

## 2018-10-30 LAB — SURGICAL PCR SCREEN
MRSA, PCR: POSITIVE — AB
Staphylococcus aureus: POSITIVE — AB

## 2018-10-30 LAB — ABO/RH: ABO/RH(D): O POS

## 2018-10-30 NOTE — Anesthesia Preprocedure Evaluation (Addendum)
Anesthesia Evaluation    Airway Mallampati: II  TM Distance: >3 FB Neck ROM: Full    Dental no notable dental hx.    Pulmonary sleep apnea , COPD, former smoker,    Pulmonary exam normal breath sounds clear to auscultation       Cardiovascular hypertension, Pt. on medications + CAD and +CHF  Normal cardiovascular exam+ Cardiac Defibrillator  Rhythm:Regular Rate:Normal     Neuro/Psych    GI/Hepatic   Endo/Other    Renal/GU      Musculoskeletal  (+) Arthritis , Osteoarthritis,    Abdominal (+) + obese,   Peds  Hematology   Anesthesia Other Findings   Reproductive/Obstetrics                            Anesthesia Physical Anesthesia Plan  ASA: III  Anesthesia Plan: General   Post-op Pain Management:    Induction: Intravenous  PONV Risk Score and Plan: 2 and Ondansetron and Midazolam  Airway Management Planned: LMA  Additional Equipment: Arterial line  Intra-op Plan:   Post-operative Plan: Extubation in OR  Informed Consent: I have reviewed the patients History and Physical, chart, labs and discussed the procedure including the risks, benefits and alternatives for the proposed anesthesia with the patient or authorized representative who has indicated his/her understanding and acceptance.     Dental advisory given  Plan Discussed with: CRNA  Anesthesia Plan Comments: (See PST note 10/29/18, Jodell Cipro, PA-C)       Anesthesia Quick Evaluation

## 2018-10-30 NOTE — Progress Notes (Signed)
10-29-18 PCR result routed to Dr. Luiz Blare' office for review

## 2018-10-30 NOTE — Progress Notes (Signed)
Spoke to patient to advise that he was positive for MRSA, and that medication was called to his pharmacy. Pt indicated that he had received a call from his pharmacy and that the  medication is going to call him $60.00.Pt verbalized" If this is the cost, I am not going to pick up the medication, and they can cancel the surgery".  Encouraged patient to continued with scheduled surgery, and  address failure to obtain Mupirocin because of cost with surgical staff, and they will address treatment on day of surgery.

## 2018-10-30 NOTE — Progress Notes (Signed)
Anesthesia Chart Review   Case:  [id number] Date/Time:  11/04/18 0950   Procedure:  TOTAL HIP ARTHROPLASTY ANTERIOR APPROACH (Right )   Anesthesia type:  Spinal   Pre-op diagnosis:  RIGHT HIP OSTEOARTHRITIS   Location:  [facility] ROOM 08 / [facility]   Surgeon:  [name], MD      DISCUSSION: 58 yo former smoker (2 pack years, quit 10/29/05) with h/o HTN, CAD, CHF, COPD, OSA w/CPAP, AICD (implanted 11/14/14 due to CHF, ICD preop prescription on chart), A-fib (on Xarelto) h/o alcohol abuse, reported marijuana use, right hip OA scheduled for above surgery on 11/04/18 with Dr. [name].   Cardiac clearance received 10/14/2018.  Per [name] Pimple, PA-C, "Patient was seen by [name] Balsam, NP 10/02/2018 and was doing well from a cardiac standpoint at that time. Per surgical clearance noted by [name] Balsam, NP,  based on ACC/AHA guidelines, [name] [name] would be at acceptable risk for the planned procedure without further cardiovascular testing. Per office protocol, patient can holdXareltofor 3days prior to procedure.  For orthopedic procedures please be sure to resume therapeutic (not prophylactic) dosing."  Pt can proceed with planned procedure barring acute status change.  VS: BP 119/74 (BP Location: Right Arm)   Pulse 75   Temp 36.9 C (Oral)   Resp 18   Ht 6\' 4"  (1.93 m)   Wt (!) 140.6 kg   SpO2 96%   BMI 37.73 kg/m   PROVIDERS: [name] Mask, MD is PCP   [name] Manges, MD is Cardiologist  LABS: Labs reviewed: Acceptable for surgery. (all labs ordered are listed, but only abnormal results are displayed)  Labs Reviewed  SURGICAL PCR SCREEN - Abnormal; Notable for the following components:      Result Value   MRSA, PCR POSITIVE (*)    Staphylococcus aureus POSITIVE (*)    All other components within normal limits  CBC WITH DIFFERENTIAL/PLATELET - Abnormal; Notable for the following components:   RBC 3.81 (*)    Hemoglobin 12.9 (*)    HCT 38.0 (*)    All other components within  normal limits  COMPREHENSIVE METABOLIC PANEL - Abnormal; Notable for the following components:   Glucose, Bld 108 (*)    Total Bilirubin 1.4 (*)    All other components within normal limits  APTT  PROTIME-INR  URINALYSIS, ROUTINE W REFLEX MICROSCOPIC  TYPE AND SCREEN  ABO/RH     IMAGES: Chest Xray 10/29/2018 FINDINGS: The heart size and mediastinal contours are within normal limits. Both lungs are clear. The visualized skeletal structures are unremarkable.  IMPRESSION: Stable exam.  No active cardiopulmonary disease.  EKG: 10/02/2018 Rate 82 bpm Sinus rhythm with short PR with premature ventricular complexes or fusion complexes Left axis deviation Nonspecific intraventricular block Possible lateral infarct, age undetermined Inferior infarct, age undetermined   CV: Echo 07/30/2017 Study Conclusions  - Left ventricle: The cavity size was mildly dilated. Wall   thickness was increased in a pattern of mild LVH. The estimated   ejection fraction was 20%. Diffuse hypokinesis. Doppler   parameters are consistent with abnormal left ventricular   relaxation (grade 1 diastolic dysfunction). - Aortic valve: There was no stenosis. There was trivial   regurgitation. - Aorta: Mildly dilated aortic root. Aortic root dimension: 39 mm   (ED). - Mitral valve: Mildly calcified annulus. - Left atrium: The atrium was severely dilated. - Right ventricle: The cavity size was normal. Pacer wire or   catheter noted in right ventricle. Systolic function was mildly  reduced. - Right atrium: The atrium was mildly dilated. - Tricuspid valve: Peak RV-RA gradient (S): 18 mm Hg. - Pulmonary arteries: PA peak pressure: 33 mm Hg (S). - Systemic veins: IVC measured 2.3 cm with < 50% respirophasic   variation, suggesting RA pressure 15 mmHg.  Impressions:  - Mildly dilated LV with mild LV hypertrophy. EF 20%, diffuse   hypokinesis. Normal RV size with mildly decreased systolic    function. No significant valvular abnormalities. Biatrial   enlargement.  Stress Test 04/08/15  Nonobstructive coronary artery disease.  Elevated LVEDP  Moderate pulmonary artery hypertension.  Cardiac output 5.5 L/min; CI 2.1.   Await echo results for LV function.  Continue medical therapy.  Past Medical History:  Diagnosis Date  . AICD (automatic cardioverter/defibrillator) present   . Arthritis   . CAD (coronary artery disease)    Mild nonobstructive (Cath 09)  . Chronic systolic heart failure (HCC)    Nonischemic CM: echo 4/12 with EF 25-30%, grade 2 diast dysfxn, mild dilated aortic root 43 mm, trivial MR, mod LAE  . COPD (chronic obstructive pulmonary disease) (HCC)   . Dyspnea   . H/O ETOH abuse   . HTN (hypertension)   . Hypersomnia   . Obesity, morbid (HCC)   . OSA on CPAP   . Systolic and diastolic CHF, acute on chronic (HCC) 11/2016  . Tobacco use disorder   . Ventricular tachycardia [facility])     Past Surgical History:  Procedure Laterality Date  . CARDIAC CATHETERIZATION N/A 04/08/2015   Procedure: Right/Left Heart Cath and Coronary Angiography;  Surgeon: [name] Crafts, MD;  [facility]VAS[facility] CV LAB;  Service: Cardiovascular;  Laterality: N/A;  . EP IMPLANTABLE DEVICE N/A 04/14/2015   Procedure: BiV ICD Insertion CRT-D;  Surgeon: [name] Salvia, MD;  Location: [facility] INVASIVE CV LAB;  Service: Cardiovascular;  Laterality: N/A;  . HYDROCELE EXCISION / REPAIR    . TUMOR EXCISION Left   . 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 VASECTOMY      MEDICATIONS: . acetaminophen (TYLENOL) 325 MG tablet  . albuterol (PROVENTIL HFA;VENTOLIN HFA) 108 (90 Base) MCG/ACT inhaler  . amiodarone (PACERONE) 200 MG tablet  . atorvastatin (LIPITOR) 80 MG tablet  . FLUoxetine (PROZAC) 20 MG tablet  . potassium chloride SA (K-DUR,KLOR-CON) 20 MEQ tablet  . rivaroxaban (XARELTO) 20 MG TABS tablet  . sacubitril-valsartan (ENTRESTO) 49-51 MG  . sotalol  (BETAPACE) 80 MG tablet  . spironolactone (ALDACTONE) 25 MG tablet  . torsemide (DEMADEX) 20 MG tablet   No current facility-administered medications for this encounter.      [name] [facility] Pre-Surgical Testing [phone] 10/30/18 1:04 PM

## 2018-10-31 ENCOUNTER — Other Ambulatory Visit: Payer: Self-pay | Admitting: Orthopedic Surgery

## 2018-10-31 ENCOUNTER — Other Ambulatory Visit: Payer: Self-pay

## 2018-10-31 NOTE — Care Plan (Signed)
Spoke with patient prior to surgey. He will discharge to home with family and go to OPPT. This has been set up with [facility] in [place] for 11/07/18 @ 1100. Patient is aware and agreeable. Choice offered.    Shauna Hugh, RNCM  [phone]

## 2018-10-31 NOTE — Telephone Encounter (Signed)
Please review for refill. Thanks!  

## 2018-11-01 MED ORDER — SPIRONOLACTONE 25 MG PO TABS: 25.0000 mg | ORAL_TABLET | Freq: Every day | ORAL | 5 refills | Status: DC

## 2018-11-03 DIAGNOSIS — M1611 Unilateral primary osteoarthritis, right hip: Secondary | ICD-10-CM | POA: Diagnosis present

## 2018-11-03 MED ORDER — DEXTROSE 5 % IV SOLN
3.0000 g | INTRAVENOUS | Status: AC
  Administered 2018-11-04: 3 g via INTRAVENOUS
  Filled 2018-11-03: qty 3

## 2018-11-03 MED ORDER — VANCOMYCIN HCL 10 G IV SOLR
1500.0000 mg | Freq: Once | INTRAVENOUS | Status: AC
  Administered 2018-11-04: 1500 mg via INTRAVENOUS
  Filled 2018-11-03: qty 1500

## 2018-11-03 MED ORDER — BUPIVACAINE LIPOSOME 1.3 % IJ SUSP
10.0000 mL | Freq: Once | INTRAMUSCULAR | Status: DC
  Filled 2018-11-03: qty 10

## 2018-11-04 ENCOUNTER — Inpatient Hospital Stay (HOSPITAL_COMMUNITY): Payer: Medicare Other

## 2018-11-04 ENCOUNTER — Other Ambulatory Visit: Payer: Self-pay

## 2018-11-04 ENCOUNTER — Inpatient Hospital Stay (HOSPITAL_COMMUNITY): Payer: Medicare Other | Admitting: Physician Assistant

## 2018-11-04 ENCOUNTER — Observation Stay (HOSPITAL_COMMUNITY)
Admission: RE | Admit: 2018-11-04 | Discharge: 2018-11-05 | Disposition: A | Discharge status: Home / Self Care | Payer: Medicare Other | Source: Ambulatory Visit | Attending: Orthopedic Surgery | Admitting: Orthopedic Surgery

## 2018-11-04 ENCOUNTER — Encounter (HOSPITAL_COMMUNITY)
Admission: RE | Disposition: A | Discharge status: Home / Self Care | Payer: Self-pay | Source: Ambulatory Visit | Attending: Orthopedic Surgery

## 2018-11-04 ENCOUNTER — Ambulatory Visit (INDEPENDENT_AMBULATORY_CARE_PROVIDER_SITE_OTHER): Payer: Medicare Other

## 2018-11-04 ENCOUNTER — Inpatient Hospital Stay (HOSPITAL_COMMUNITY): Payer: Medicare Other | Admitting: Registered Nurse

## 2018-11-04 ENCOUNTER — Encounter (HOSPITAL_COMMUNITY): Payer: Self-pay

## 2018-11-04 DIAGNOSIS — Z87891 Personal history of nicotine dependence: Secondary | ICD-10-CM | POA: Diagnosis not present

## 2018-11-04 DIAGNOSIS — I428 Other cardiomyopathies: Secondary | ICD-10-CM | POA: Insufficient documentation

## 2018-11-04 DIAGNOSIS — G4733 Obstructive sleep apnea (adult) (pediatric): Secondary | ICD-10-CM | POA: Diagnosis not present

## 2018-11-04 DIAGNOSIS — I2510 Atherosclerotic heart disease of native coronary artery without angina pectoris: Secondary | ICD-10-CM | POA: Diagnosis not present

## 2018-11-04 DIAGNOSIS — Z79899 Other long term (current) drug therapy: Secondary | ICD-10-CM | POA: Insufficient documentation

## 2018-11-04 DIAGNOSIS — Z7901 Long term (current) use of anticoagulants: Secondary | ICD-10-CM | POA: Insufficient documentation

## 2018-11-04 DIAGNOSIS — I480 Paroxysmal atrial fibrillation: Secondary | ICD-10-CM | POA: Insufficient documentation

## 2018-11-04 DIAGNOSIS — Z419 Encounter for procedure for purposes other than remedying health state, unspecified: Secondary | ICD-10-CM

## 2018-11-04 DIAGNOSIS — M1611 Unilateral primary osteoarthritis, right hip: Secondary | ICD-10-CM | POA: Diagnosis present

## 2018-11-04 DIAGNOSIS — I5022 Chronic systolic (congestive) heart failure: Secondary | ICD-10-CM

## 2018-11-04 DIAGNOSIS — I5042 Chronic combined systolic (congestive) and diastolic (congestive) heart failure: Secondary | ICD-10-CM | POA: Insufficient documentation

## 2018-11-04 DIAGNOSIS — I110 Hypertensive heart disease with heart failure: Secondary | ICD-10-CM | POA: Diagnosis not present

## 2018-11-04 DIAGNOSIS — Z95810 Presence of automatic (implantable) cardiac defibrillator: Secondary | ICD-10-CM | POA: Insufficient documentation

## 2018-11-04 DIAGNOSIS — J449 Chronic obstructive pulmonary disease, unspecified: Secondary | ICD-10-CM | POA: Insufficient documentation

## 2018-11-04 DIAGNOSIS — Z6837 Body mass index (BMI) 37.0-37.9, adult: Secondary | ICD-10-CM | POA: Insufficient documentation

## 2018-11-04 HISTORY — PX: TOTAL HIP ARTHROPLASTY: SHX124

## 2018-11-04 LAB — TYPE AND SCREEN
ABO/RH(D): O POS
Antibody Screen: NEGATIVE

## 2018-11-04 SURGERY — ARTHROPLASTY, HIP, TOTAL, ANTERIOR APPROACH
Anesthesia: General | Site: Hip | Laterality: Right

## 2018-11-04 MED ORDER — BUPIVACAINE HCL (PF) 0.25 % IJ SOLN
INTRAMUSCULAR | Status: AC
  Filled 2018-11-04: qty 30

## 2018-11-04 MED ORDER — DEXAMETHASONE SODIUM PHOSPHATE 10 MG/ML IJ SOLN
10.0000 mg | Freq: Two times a day (BID) | INTRAMUSCULAR | Status: DC
  Administered 2018-11-04 – 2018-11-05 (×2): 10 mg via INTRAVENOUS
  Filled 2018-11-04 (×2): qty 1

## 2018-11-04 MED ORDER — CHLORHEXIDINE GLUCONATE 4 % EX LIQD: 60.0000 mL | Freq: Once | CUTANEOUS | Status: DC

## 2018-11-04 MED ORDER — METHOCARBAMOL 500 MG PO TABS
500.0000 mg | ORAL_TABLET | Freq: Four times a day (QID) | ORAL | Status: DC | PRN
  Administered 2018-11-04 – 2018-11-05 (×2): 500 mg via ORAL
  Filled 2018-11-04 (×3): qty 1

## 2018-11-04 MED ORDER — OXYCODONE HCL 5 MG/5ML PO SOLN: 5.0000 mg | Freq: Once | ORAL | Status: DC | PRN

## 2018-11-04 MED ORDER — FLUOXETINE HCL 20 MG PO CAPS
20.0000 mg | ORAL_CAPSULE | Freq: Every day | ORAL | Status: DC
  Administered 2018-11-05: 20 mg via ORAL
  Filled 2018-11-04 (×2): qty 1

## 2018-11-04 MED ORDER — SODIUM CHLORIDE 0.9 % IV SOLN
INTRAVENOUS | Status: DC
  Administered 2018-11-04 – 2018-11-05 (×2): via INTRAVENOUS

## 2018-11-04 MED ORDER — RIVAROXABAN 10 MG PO TABS
10.0000 mg | ORAL_TABLET | Freq: Every day | ORAL | Status: DC
  Administered 2018-11-05: 10 mg via ORAL
  Filled 2018-11-04: qty 1

## 2018-11-04 MED ORDER — LACTATED RINGERS IV SOLN
INTRAVENOUS | Status: DC
  Administered 2018-11-04 (×2): via INTRAVENOUS

## 2018-11-04 MED ORDER — MIDAZOLAM HCL 5 MG/5ML IJ SOLN
INTRAMUSCULAR | Status: DC | PRN
  Administered 2018-11-04: 2 mg via INTRAVENOUS

## 2018-11-04 MED ORDER — OXYCODONE HCL 5 MG PO TABS
5.0000 mg | ORAL_TABLET | ORAL | Status: DC | PRN
  Administered 2018-11-04: 5 mg via ORAL
  Administered 2018-11-05 (×2): 10 mg via ORAL
  Filled 2018-11-04 (×2): qty 2
  Filled 2018-11-04: qty 1

## 2018-11-04 MED ORDER — ALBUTEROL SULFATE (2.5 MG/3ML) 0.083% IN NEBU: 3.0000 mL | INHALATION_SOLUTION | Freq: Four times a day (QID) | RESPIRATORY_TRACT | Status: DC | PRN

## 2018-11-04 MED ORDER — FENTANYL CITRATE (PF) 250 MCG/5ML IJ SOLN
INTRAMUSCULAR | Status: AC
  Filled 2018-11-04: qty 5

## 2018-11-04 MED ORDER — ACETAMINOPHEN 325 MG PO TABS
325.0000 mg | ORAL_TABLET | Freq: Four times a day (QID) | ORAL | Status: DC | PRN
  Administered 2018-11-05: 650 mg via ORAL
  Filled 2018-11-04: qty 2

## 2018-11-04 MED ORDER — PROPOFOL 10 MG/ML IV BOLUS
INTRAVENOUS | Status: DC | PRN
  Administered 2018-11-04: 100 mg via INTRAVENOUS

## 2018-11-04 MED ORDER — POTASSIUM CHLORIDE CRYS ER 20 MEQ PO TBCR
40.0000 meq | EXTENDED_RELEASE_TABLET | Freq: Two times a day (BID) | ORAL | Status: DC
  Administered 2018-11-04 – 2018-11-05 (×2): 40 meq via ORAL
  Filled 2018-11-04 (×2): qty 2

## 2018-11-04 MED ORDER — TRANEXAMIC ACID-NACL 1000-0.7 MG/100ML-% IV SOLN
1000.0000 mg | Freq: Once | INTRAVENOUS | Status: AC
  Administered 2018-11-04: 1000 mg via INTRAVENOUS
  Filled 2018-11-04: qty 100

## 2018-11-04 MED ORDER — GLYCOPYRROLATE PF 0.2 MG/ML IJ SOSY
PREFILLED_SYRINGE | INTRAMUSCULAR | Status: AC
  Filled 2018-11-04: qty 1

## 2018-11-04 MED ORDER — PROMETHAZINE HCL 25 MG/ML IJ SOLN: 6.2500 mg | INTRAMUSCULAR | Status: DC | PRN

## 2018-11-04 MED ORDER — TIZANIDINE HCL 2 MG PO TABS: 2.0000 mg | ORAL_TABLET | Freq: Three times a day (TID) | ORAL | 0 refills | Status: DC | PRN

## 2018-11-04 MED ORDER — SODIUM CHLORIDE 0.9% FLUSH
INTRAVENOUS | Status: DC | PRN
  Administered 2018-11-04: 20 mL

## 2018-11-04 MED ORDER — OXYCODONE-ACETAMINOPHEN 5-325 MG PO TABS: 1.0000 | ORAL_TABLET | Freq: Four times a day (QID) | ORAL | 0 refills | Status: DC | PRN

## 2018-11-04 MED ORDER — OXYCODONE HCL 5 MG PO TABS: 5.0000 mg | ORAL_TABLET | Freq: Once | ORAL | Status: DC | PRN

## 2018-11-04 MED ORDER — HYDROMORPHONE HCL 1 MG/ML IJ SOLN
INTRAMUSCULAR | Status: AC
  Filled 2018-11-04: qty 2

## 2018-11-04 MED ORDER — WATER FOR IRRIGATION, STERILE IR SOLN
Status: DC | PRN
  Administered 2018-11-04: 1000 mL

## 2018-11-04 MED ORDER — ALBUMIN HUMAN 5 % IV SOLN
INTRAVENOUS | Status: DC | PRN
  Administered 2018-11-04 (×2): via INTRAVENOUS

## 2018-11-04 MED ORDER — SPIRONOLACTONE 25 MG PO TABS
25.0000 mg | ORAL_TABLET | Freq: Every day | ORAL | Status: DC
  Administered 2018-11-04 – 2018-11-05 (×2): 25 mg via ORAL
  Filled 2018-11-04 (×2): qty 1

## 2018-11-04 MED ORDER — MIDAZOLAM HCL 2 MG/2ML IJ SOLN
INTRAMUSCULAR | Status: AC
  Filled 2018-11-04: qty 2

## 2018-11-04 MED ORDER — ASPIRIN EC 325 MG PO TBEC: 325.0000 mg | DELAYED_RELEASE_TABLET | Freq: Two times a day (BID) | ORAL | 0 refills | Status: DC

## 2018-11-04 MED ORDER — PHENYLEPHRINE HCL 10 MG/ML IJ SOLN
INTRAMUSCULAR | Status: AC
  Filled 2018-11-04: qty 1

## 2018-11-04 MED ORDER — LIDOCAINE 2% (20 MG/ML) 5 ML SYRINGE
INTRAMUSCULAR | Status: AC
  Filled 2018-11-04: qty 5

## 2018-11-04 MED ORDER — ONDANSETRON HCL 4 MG/2ML IJ SOLN
INTRAMUSCULAR | Status: DC | PRN
  Administered 2018-11-04: 4 mg via INTRAVENOUS

## 2018-11-04 MED ORDER — BISACODYL 5 MG PO TBEC: 5.0000 mg | DELAYED_RELEASE_TABLET | Freq: Every day | ORAL | Status: DC | PRN

## 2018-11-04 MED ORDER — PROPOFOL 10 MG/ML IV BOLUS
INTRAVENOUS | Status: AC
  Filled 2018-11-04: qty 60

## 2018-11-04 MED ORDER — ALBUMIN HUMAN 5 % IV SOLN
INTRAVENOUS | Status: AC
  Filled 2018-11-04: qty 500

## 2018-11-04 MED ORDER — TORSEMIDE 20 MG PO TABS
60.0000 mg | ORAL_TABLET | Freq: Two times a day (BID) | ORAL | Status: DC
  Administered 2018-11-04 – 2018-11-05 (×2): 60 mg via ORAL
  Filled 2018-11-04 (×3): qty 3

## 2018-11-04 MED ORDER — HYDROMORPHONE HCL 1 MG/ML IJ SOLN
0.5000 mg | INTRAMUSCULAR | Status: DC | PRN
  Administered 2018-11-05: 0.05 mg via INTRAVENOUS
  Filled 2018-11-04: qty 1

## 2018-11-04 MED ORDER — FENTANYL CITRATE (PF) 100 MCG/2ML IJ SOLN
INTRAMUSCULAR | Status: DC | PRN
  Administered 2018-11-04 (×2): 100 ug via INTRAVENOUS
  Administered 2018-11-04 (×2): 50 ug via INTRAVENOUS
  Administered 2018-11-04: 100 ug via INTRAVENOUS
  Administered 2018-11-04: 50 ug via INTRAVENOUS
  Administered 2018-11-04 (×3): 100 ug via INTRAVENOUS
  Administered 2018-11-04 (×2): 50 ug via INTRAVENOUS

## 2018-11-04 MED ORDER — DOCUSATE SODIUM 100 MG PO CAPS
100.0000 mg | ORAL_CAPSULE | Freq: Two times a day (BID) | ORAL | Status: DC
  Administered 2018-11-04 – 2018-11-05 (×2): 100 mg via ORAL
  Filled 2018-11-04 (×2): qty 1

## 2018-11-04 MED ORDER — HYDROMORPHONE HCL 1 MG/ML IJ SOLN
0.2500 mg | INTRAMUSCULAR | Status: DC | PRN
  Administered 2018-11-04 (×2): 0.5 mg via INTRAVENOUS

## 2018-11-04 MED ORDER — AMIODARONE HCL 200 MG PO TABS
200.0000 mg | ORAL_TABLET | Freq: Every day | ORAL | Status: DC
  Administered 2018-11-05: 200 mg via ORAL
  Filled 2018-11-04: qty 1

## 2018-11-04 MED ORDER — VANCOMYCIN HCL IN DEXTROSE 1-5 GM/200ML-% IV SOLN
1000.0000 mg | Freq: Two times a day (BID) | INTRAVENOUS | Status: AC
  Administered 2018-11-04: 1000 mg via INTRAVENOUS
  Filled 2018-11-04: qty 200

## 2018-11-04 MED ORDER — SODIUM CHLORIDE 0.9 % IR SOLN
Status: DC | PRN
  Administered 2018-11-04: 1

## 2018-11-04 MED ORDER — ROCURONIUM BROMIDE 100 MG/10ML IV SOLN
INTRAVENOUS | Status: AC
  Filled 2018-11-04: qty 1

## 2018-11-04 MED ORDER — SODIUM CHLORIDE (PF) 0.9 % IJ SOLN
INTRAMUSCULAR | Status: AC
  Filled 2018-11-04: qty 50

## 2018-11-04 MED ORDER — BUPIVACAINE HCL (PF) 0.5 % IJ SOLN
INTRAMUSCULAR | Status: DC | PRN
  Administered 2018-11-04: 30 mL

## 2018-11-04 MED ORDER — DIPHENHYDRAMINE HCL 12.5 MG/5ML PO ELIX: 12.5000 mg | ORAL_SOLUTION | ORAL | Status: DC | PRN

## 2018-11-04 MED ORDER — TRANEXAMIC ACID-NACL 1000-0.7 MG/100ML-% IV SOLN
1000.0000 mg | INTRAVENOUS | Status: AC
  Administered 2018-11-04: 1000 mg via INTRAVENOUS
  Filled 2018-11-04: qty 100

## 2018-11-04 MED ORDER — FENTANYL CITRATE (PF) 100 MCG/2ML IJ SOLN
INTRAMUSCULAR | Status: AC
  Filled 2018-11-04: qty 2

## 2018-11-04 MED ORDER — BUPIVACAINE LIPOSOME 1.3 % IJ SUSP
INTRAMUSCULAR | Status: DC | PRN
  Administered 2018-11-04: 10 mL

## 2018-11-04 MED ORDER — ALUM & MAG HYDROXIDE-SIMETH 200-200-20 MG/5ML PO SUSP: 30.0000 mL | ORAL | Status: DC | PRN

## 2018-11-04 MED ORDER — ONDANSETRON HCL 4 MG PO TABS: 4.0000 mg | ORAL_TABLET | Freq: Four times a day (QID) | ORAL | Status: DC | PRN

## 2018-11-04 MED ORDER — GABAPENTIN 300 MG PO CAPS
300.0000 mg | ORAL_CAPSULE | Freq: Two times a day (BID) | ORAL | Status: DC
  Administered 2018-11-04 – 2018-11-05 (×2): 300 mg via ORAL
  Filled 2018-11-04 (×3): qty 1

## 2018-11-04 MED ORDER — SOTALOL HCL 80 MG PO TABS
80.0000 mg | ORAL_TABLET | Freq: Two times a day (BID) | ORAL | Status: DC
  Administered 2018-11-04 – 2018-11-05 (×2): 80 mg via ORAL
  Filled 2018-11-04 (×3): qty 1

## 2018-11-04 MED ORDER — DOCUSATE SODIUM 100 MG PO CAPS: 100.0000 mg | ORAL_CAPSULE | Freq: Two times a day (BID) | ORAL | 0 refills | Status: DC

## 2018-11-04 MED ORDER — DEXAMETHASONE SODIUM PHOSPHATE 10 MG/ML IJ SOLN
INTRAMUSCULAR | Status: AC
  Filled 2018-11-04: qty 1

## 2018-11-04 MED ORDER — DEXAMETHASONE SODIUM PHOSPHATE 10 MG/ML IJ SOLN
INTRAMUSCULAR | Status: DC | PRN
  Administered 2018-11-04: 10 mg via INTRAVENOUS

## 2018-11-04 MED ORDER — BUPIVACAINE HCL (PF) 0.5 % IJ SOLN
INTRAMUSCULAR | Status: AC
  Filled 2018-11-04: qty 30

## 2018-11-04 MED ORDER — SODIUM CHLORIDE 0.9 % IV SOLN
INTRAVENOUS | Status: DC | PRN
  Administered 2018-11-04: 25 ug/min via INTRAVENOUS

## 2018-11-04 MED ORDER — ETOMIDATE 2 MG/ML IV SOLN
INTRAVENOUS | Status: DC | PRN
  Administered 2018-11-04: 14 mg via INTRAVENOUS

## 2018-11-04 MED ORDER — POLYETHYLENE GLYCOL 3350 17 G PO PACK: 17.0000 g | PACK | Freq: Every day | ORAL | Status: DC | PRN

## 2018-11-04 MED ORDER — METHOCARBAMOL 500 MG IVPB - SIMPLE MED
500.0000 mg | Freq: Four times a day (QID) | INTRAVENOUS | Status: DC | PRN
  Filled 2018-11-04: qty 50

## 2018-11-04 MED ORDER — ONDANSETRON HCL 4 MG/2ML IJ SOLN
INTRAMUSCULAR | Status: AC
  Filled 2018-11-04: qty 2

## 2018-11-04 MED ORDER — LABETALOL HCL 5 MG/ML IV SOLN
INTRAVENOUS | Status: AC
  Filled 2018-11-04: qty 4

## 2018-11-04 MED ORDER — LIDOCAINE 2% (20 MG/ML) 5 ML SYRINGE
INTRAMUSCULAR | Status: DC | PRN
  Administered 2018-11-04: 75 mg via INTRAVENOUS

## 2018-11-04 MED ORDER — ONDANSETRON HCL 4 MG/2ML IJ SOLN: 4.0000 mg | Freq: Four times a day (QID) | INTRAMUSCULAR | Status: DC | PRN

## 2018-11-04 MED ORDER — MAGNESIUM CITRATE PO SOLN: 1.0000 | Freq: Once | ORAL | Status: DC | PRN

## 2018-11-04 SURGICAL SUPPLY — 37 items
BAG ZIPLOCK 12X15 (MISCELLANEOUS) ×2 IMPLANT
BENZOIN TINCTURE PRP APPL 2/3 (GAUZE/BANDAGES/DRESSINGS) ×2 IMPLANT
BLADE SAW SGTL 18X1.27X75 (BLADE) ×2 IMPLANT
BLADE SURG SZ10 CARB STEEL (BLADE) ×4 IMPLANT
COVER PERINEAL POST (MISCELLANEOUS) ×2 IMPLANT
COVER SURGICAL LIGHT HANDLE (MISCELLANEOUS) ×2 IMPLANT
COVER WAND RF STERILE (DRAPES) IMPLANT
DRAPE STERI IOBAN 125X83 (DRAPES) ×2 IMPLANT
DRAPE U-SHAPE 47X51 STRL (DRAPES) ×4 IMPLANT
DRSG AQUACEL AG ADV 3.5X 6 (GAUZE/BANDAGES/DRESSINGS) ×2 IMPLANT
DURAPREP 26ML APPLICATOR (WOUND CARE) ×2 IMPLANT
ELECT REM PT RETURN 15FT ADLT (MISCELLANEOUS) ×2 IMPLANT
ELIMINATOR HOLE APEX DEPUY (Hips) ×2 IMPLANT
GAUZE XEROFORM 1X8 LF (GAUZE/BANDAGES/DRESSINGS) ×2 IMPLANT
GLOVE BIOGEL PI IND STRL 8 (GLOVE) ×2 IMPLANT
GLOVE BIOGEL PI INDICATOR 8 (GLOVE) ×2
GLOVE ECLIPSE 7.5 STRL STRAW (GLOVE) ×4 IMPLANT
GOWN STRL REUS W/TWL XL LVL3 (GOWN DISPOSABLE) ×4 IMPLANT
HEAD CERAMIC DELTA 36 PLUS 1.5 (Hips) ×2 IMPLANT
HOLDER FOLEY CATH W/STRAP (MISCELLANEOUS) ×2 IMPLANT
HOOD PEEL AWAY FLYTE STAYCOOL (MISCELLANEOUS) ×4 IMPLANT
LINER PINN ALTRX ACTABR 36X60 (Liner) ×1 IMPLANT
LINER PINNACLE ALTRAX ACTABULR (Liner) ×1 IMPLANT
NEEDLE HYPO 22GX1.5 SAFETY (NEEDLE) ×2 IMPLANT
PACK ANTERIOR HIP CUSTOM (KITS) ×2 IMPLANT
PIN SECT CUP 60MM (Hips) ×2 IMPLANT
STAPLER VISISTAT 35W (STAPLE) IMPLANT
STEM CORAIL HC SZ14 135 (Stem) ×2 IMPLANT
STRIP CLOSURE SKIN 1/2X4 (GAUZE/BANDAGES/DRESSINGS) ×2 IMPLANT
SUT ETHIBOND NAB CT1 #1 30IN (SUTURE) ×6 IMPLANT
SUT MNCRL AB 3-0 PS2 18 (SUTURE) IMPLANT
SUT VIC AB 0 CT1 36 (SUTURE) ×4 IMPLANT
SUT VIC AB 1 CT1 36 (SUTURE) ×2 IMPLANT
SUT VIC AB 2-0 CT1 27 (SUTURE) ×1
SUT VIC AB 2-0 CT1 TAPERPNT 27 (SUTURE) ×1 IMPLANT
TRAY FOLEY MTR SLVR 16FR STAT (SET/KITS/TRAYS/PACK) ×2 IMPLANT
YANKAUER SUCT BULB TIP 10FT TU (MISCELLANEOUS) ×2 IMPLANT

## 2018-11-04 NOTE — Plan of Care (Signed)
Plan of care 

## 2018-11-04 NOTE — Care Plan (Signed)
Ortho Bundle Case Management Note  Patient Details  Name: [name] [name] MRN: [id number] Date of Birth: [birth year]   Spoke with patient prior to surgey. He will discharge to home with family and go to OPPT. This has been set up with [facility] in [place] for 11/07/18 @ 1100. Patient is aware and agreeable. Choice offered.                    DME Arranged:  Dan Humphreys rolling DME Agency:  [employer]  HH Arranged:    HH Agency:     Additional Comments: Please contact me with any questions of if this plan should need to change.  Shauna Hugh,  RN,BSN,MHA,CCM  [facility]edic Specialist  [phone] 11/04/2018, 11:05 AM

## 2018-11-04 NOTE — Transfer of Care (Signed)
Immediate Anesthesia Transfer of Care Note  Patient: [name] [name]  Procedure(s) Performed: TOTAL HIP ARTHROPLASTY ANTERIOR APPROACH (Right Hip)  Patient Location: PACU  Anesthesia Type:General  Level of Consciousness: awake, alert , oriented and patient cooperative  Airway & Oxygen Therapy: Patient Spontanous Breathing and Patient connected to face mask oxygen  Post-op Assessment: Report given to RN, Post -op Vital signs reviewed and unstable, Anesthesiologist notified and Patient moving all extremities X 4  Post vital signs: stable  Last Vitals:  Vitals Value Taken Time  BP 174/117 11/04/2018 12:37 PM  Temp    Pulse 68 11/04/2018 12:44 PM  Resp 12 11/04/2018 12:44 PM  SpO2 96 % 11/04/2018 12:44 PM  Vitals shown include unvalidated device data.  Last Pain:  Vitals:   11/04/18 0808  TempSrc:   PainSc: 0-No pain         Complications: No apparent anesthesia complications  Dr. Hyacinth Meeker informed of  Low  intermittant O2 sats O2 sats 90-97%

## 2018-11-04 NOTE — Evaluation (Signed)
Physical Therapy Evaluation Patient Details Name: [name] [name] MRN: [id number] DOB: [birth year] Today's Date: 11/04/2018   History of Present Illness  57 yo male s/p R DA-THA on 11/04/18. PMH includes OA, VTACH, AKI, CHF, HTN, obesity, ICD, afib, ETOH abuse.   Clinical Impression  Pt presents with R hip pain, post-surgical R hip weakness, difficulty performing mobility tasks, and dizziness/pallor/nausea with mobility POD#0. Pt to benefit from acute PT to address deficits. Pt ambulated 12 ft in room, and required min guard to min assist for mobility tasks. Pt educated on ankle pumps (20/hour) to perform this afternoon/evening to increase circulation, to pt's tolerance and limited by pain. PT to progress mobility as tolerated, and will continue to follow acutely.        Follow Up Recommendations Follow surgeon's recommendation for DC plan and follow-up therapies;Supervision for mobility/OOB(HHPT vs OPPT)    Equipment Recommendations  Rolling walker with 5" wheels;3in1 (PT)    Recommendations for Other Services       Precautions / Restrictions Precautions Precautions: Fall Restrictions Weight Bearing Restrictions: No Other Position/Activity Restrictions: WBAT       Mobility  Bed Mobility Overal bed mobility: Needs Assistance Bed Mobility: Supine to Sit     Supine to sit: Min assist;HOB elevated     General bed mobility comments: Min assist for RLE management, trunk elevation. Pt with use of bedrails and HOB elevation to sit EOB, increased time and effort.   Transfers Overall transfer level: Needs assistance Equipment used: Rolling walker (2 wheeled) Transfers: Sit to/from Stand Sit to Stand: From elevated surface;Min assist         General transfer comment: Min assist for power up and steadying upon standing. Verbal cuing for hand placement when rising.   Ambulation/Gait Ambulation/Gait assistance: Min guard;+2 safety/equipment Gait Distance (Feet): 12 Feet Assistive  device: Rolling walker (2 wheeled) Gait Pattern/deviations: Step-to pattern;Decreased stance time - right;Decreased weight shift to right;Antalgic;Wide base of support;Trunk flexed Gait velocity: decr    General Gait Details: Min guard for safety. Verbal cuing for sequencing, placement in RW, upright posture. Ambulation limited to room due to pt pallor, dizziness. Pt encouraged to sit in recliner on opposite side of the room, cool cloth given and pt leaned back in recliner. Pt felt better after ~1 minute rest.   Stairs            Wheelchair Mobility    Modified Rankin (Stroke Patients Only)       Balance Overall balance assessment: Mild deficits observed, not formally tested                                           Pertinent Vitals/Pain Pain Assessment: Faces Faces Pain Scale: Hurts whole lot Pain Location: R hip, with mobility  Pain Descriptors / Indicators: Sore Pain Intervention(s): Limited activity within patient's tolerance;Repositioned;Ice applied;Monitored during session;Premedicated before session    Home Living Family/patient expects to be discharged to:: Private residence Living Arrangements: Spouse/significant other(fiance ) Available Help at Discharge: Family;Friend(s);Available 24 hours/day(sister, brother in law, friends, fiance. Per fiance, there will be someone there to help him at all times ) Type of Home: House Home Access: Stairs to enter   Entrance Stairs-Number of Steps: 5 Home Layout: One level Home Equipment: Cane - single point      Prior Function Level of Independence: Independent with assistive device(s)  Comments: used cane PRN due to pain prior to admission      Hand Dominance   Dominant Hand: Right    Extremity/Trunk Assessment   Upper Extremity Assessment Upper Extremity Assessment: Overall WFL for tasks assessed    Lower Extremity Assessment Lower Extremity Assessment: Overall WFL for tasks  assessed;RLE deficits/detail RLE Deficits / Details: suspected post-surgical weakness; able to perform ankle pumps, quad set, SLR with lift assist, heel slide  RLE Sensation: WNL    Cervical / Trunk Assessment Cervical / Trunk Assessment: Normal  Communication   Communication: No difficulties  Cognition Arousal/Alertness: Awake/alert Behavior During Therapy: WFL for tasks assessed/performed Overall Cognitive Status: Within Functional Limits for tasks assessed                                        General Comments      Exercises     Assessment/Plan    PT Assessment Patient needs continued PT services  PT Problem List Decreased strength;Pain;Decreased activity tolerance;Decreased knowledge of use of DME;Decreased mobility;Decreased balance;Decreased safety awareness       PT Treatment Interventions DME instruction;Therapeutic activities;Gait training;Therapeutic exercise;Patient/family education;Balance training;Stair training;Functional mobility training    PT Goals (Current goals can be found in the Care Plan section)  Acute Rehab PT Goals Patient Stated Goal: none stated  PT Goal Formulation: With patient Time For Goal Achievement: 11/11/18 Potential to Achieve Goals: Good    Frequency 7X/week   Barriers to discharge        Co-evaluation               AM-PAC PT "6 Clicks" Mobility  Outcome Measure Help needed turning from your back to your side while in a flat bed without using bedrails?: A Little Help needed moving from lying on your back to sitting on the side of a flat bed without using bedrails?: A Little Help needed moving to and from a bed to a chair (including a wheelchair)?: A Little Help needed standing up from a chair using your arms (e.g., wheelchair or bedside chair)?: A Little Help needed to walk in hospital room?: A Little Help needed climbing 3-5 steps with a railing? : A Lot 6 Click Score: 17    End of Session Equipment  Utilized During Treatment: Gait belt Activity Tolerance: Patient tolerated treatment well;Patient limited by pain;Treatment limited secondary to medical complications (Comment) Patient left: in chair;with chair alarm set;with call bell/phone within reach;with family/visitor present;with SCD's reapplied Nurse Communication: Mobility status PT Visit Diagnosis: Other abnormalities of gait and mobility (R26.89);Difficulty in walking, not elsewhere classified (R26.2)    Time: 3567-0141 PT Time Calculation (min) (ACUTE ONLY): 26 min   Charges:   PT Evaluation $PT Eval Low Complexity: 1 Low PT Treatments $Gait Training: 8-22 mins      Nicola Police, PT Acute Rehabilitation Services Pager [phone]  Office [phone]  Tyrone Apple D Despina Hidden 11/04/2018, 7:22 PM

## 2018-11-04 NOTE — Brief Op Note (Signed)
11/04/2018  11:50 AM  PATIENT:  [name] [name]  58 y.o. male  PRE-OPERATIVE DIAGNOSIS:  RIGHT HIP OSTEOARTHRITIS  POST-OPERATIVE DIAGNOSIS:  RIGHT HIP OSTEOARTHRITIS  PROCEDURE:  Procedure(s): TOTAL HIP ARTHROPLASTY ANTERIOR APPROACH (Right)  SURGEON:  Surgeon(s) and Role:    [name], MD - Primary  PHYSICIAN ASSISTANT:   ASSISTANTS: bethune   ANESTHESIA:   general  EBL:  450 mL   BLOOD ADMINISTERED:none  DRAINS: none   LOCAL MEDICATIONS USED:  MARCAINE   +Experel  SPECIMEN:  No Specimen  DISPOSITION OF SPECIMEN:  N/A  COUNTS:  YES  TOURNIQUET:  * No tourniquets in log *  DICTATION: .Other Dictation: Dictation Number [id number]  PLAN OF CARE: Admit for overnight observation  PATIENT DISPOSITION:  PACU - hemodynamically stable.   Delay start of Pharmacological VTE agent (>24hrs) due to surgical blood loss or risk of bleeding: no

## 2018-11-04 NOTE — Anesthesia Postprocedure Evaluation (Signed)
Anesthesia Post Note  Patient: [name] [name]  Procedure(s) Performed: TOTAL HIP ARTHROPLASTY ANTERIOR APPROACH (Right Hip)     Patient location during evaluation: PACU Anesthesia Type: General Level of consciousness: awake and alert Pain management: pain level controlled Vital Signs Assessment: post-procedure vital signs reviewed and stable Respiratory status: spontaneous breathing, nonlabored ventilation and respiratory function stable Cardiovascular status: blood pressure returned to baseline and stable Postop Assessment: no apparent nausea or vomiting Anesthetic complications: no    Last Vitals:  Vitals:   11/04/18 1315 11/04/18 1330  BP: (!) 177/112 (!) 149/97  Pulse: 71 65  Resp: 18 14  Temp:    SpO2: 97% 98%    Last Pain:  Vitals:   11/04/18 1330  TempSrc:   PainSc: (P) 3                  Lowella Curb

## 2018-11-04 NOTE — Anesthesia Procedure Notes (Signed)
Procedure Name: LMA Insertion Date/Time: 11/04/2018 10:06 AM Performed by: Illene Silver, CRNA Pre-anesthesia Checklist: Patient identified, Emergency Drugs available, Suction available and Patient being monitored Patient Re-evaluated:Patient Re-evaluated prior to induction Oxygen Delivery Method: Circle system utilized Preoxygenation: Pre-oxygenation with 100% oxygen Induction Type: IV induction LMA: LMA with gastric port inserted LMA Size: 5.0 Tube type: Oral Number of attempts: 1 Airway Equipment and Method: Oral airway Placement Confirmation: positive ETCO2 Tube secured with: Tape Dental Injury: Teeth and Oropharynx as per pre-operative assessment

## 2018-11-04 NOTE — Discharge Instructions (Signed)

## 2018-11-04 NOTE — Op Note (Signed)
NAMEBUNYAN, MOLE MEDICAL RECORD [id number] ACCOUNT [id number] DATE OF BIRTH:Jul 10, 1961 FACILITY: [facility] LOCATION: [id number] PHYSICIAN:Krystianna Soth L. Nadalyn Deringer, MD  OPERATIVE REPORT  DATE OF PROCEDURE:  11/04/2018  PREOPERATIVE DIAGNOSIS:  End-stage degenerative joint disease, right hip.  POSTOPERATIVE DIAGNOSIS:  End-stage degenerative joint disease, right hip.  PROCEDURE: 1.  Right total hip replacement with a Corail stem size 14 high offset, 58 mm Pinnacle cup, a neutral liner, and a delta ceramic hip ball plus +1.5. 2.  Interpretation of multiple intraoperative fluoroscopic images.  SURGEON:  [name], MD  ASSISTANT:  Gus Puma, PA-C, was present for the entire case and assisted with retraction, manipulation of the leg, bone cuts, and closing to minimize OR time.  BRIEF HISTORY:  The patient is a 58 year old male with a long history of significant complaints of right hip pain.  He had been treated conservatively for a prolonged period of time.  X-ray showed bone-on-bone changes having night pain and light activity  pain.  After failure of all conservative care, he was taken to the operating room for right total hip replacement.  DESCRIPTION OF PROCEDURE:  The patient was taken to the operating room.  After adequate anesthesia was obtained with general anesthetic, the patient was supine on the operating table and then moved onto the Hana bed.  All bony prominences were well  padded.  Attention was turned to the right hip where after routine prep and drape, an incision was made for an anterior approach to the hip.  Dissection was carried down to the level of the tensor fascia, which was clearly identified and divided in line  with its fibers.  Finger dissection allowed [place] to get retractors above and below the femoral neck.  Capsule was opened and tagged, and following this, a provisional neck cut was made and the head removed.  Retractors were put in place above and below the  acetabulum.   It was sequentially reamed to a level of 57 mm.  A 58 mm Pinnacle cup was hammered into place with 45 degrees of lateral opening and 30 degrees of anteversion.  Once this was completed, attention was turned to the stem side where it was  externally rotated, extended and adducted.  It was opened with a cookie cutter, followed by a chili pepper, followed by sequential rasping up to a level of 14.  He has a high offset head to match his high offset stem to match his preoperative imaging and  then with the trial.  I got this down, calcar planed, and then trialed it.  Fluoroscopic imaging was used at this point to assess leg length and stability.  Excellent fit, fill and offset was achieved, and at this point, the 14 trial was removed.  The  14 final stem high-offset Corail was opened and placed.  A +1.5 delta ceramic hip ball was placed.  The hip was reduced, and final fluoroscopic images were taken.  A stability check was done with 60 of extension and 90 of external rotation.  The hip was  stable in this position.  At this point, attention was turned towards closure.  The capsule was closed with 1 Vicryl running.  The tensor fascia was closed with 0 Vicryl running, the skin with 0 Vicryl and 3-0 Monocryl subcuticular.  Benzoin,  Steri-Strips, and a dry sterile compressive dressing were applied.  The patient was taken to recovery and was noted to be in satisfactory condition.  Estimated blood loss for the procedure was 450 mL.  The  final can be gotten from the anesthetic record.  LN/NUANCE  D:11/04/2018 T:11/04/2018 JOB:[id number]

## 2018-11-04 NOTE — Anesthesia Procedure Notes (Signed)
Arterial Line Insertion Start/End2/17/2020 10:15 AM Performed by: Lowella Curb, MD  Preanesthetic checklist: patient identified, IV checked, site marked, risks and benefits discussed, surgical consent, monitors and equipment checked, pre-op evaluation, timeout performed and anesthesia consent radial was placed Catheter size: 20 G Hand hygiene performed   Attempts: 3 Procedure performed without using ultrasound guided technique. Ultrasound Notes:anatomy identified, needle tip was noted to be adjacent to the nerve/plexus identified and no ultrasound evidence of intravascular and/or intraneural injection Following insertion, dressing applied. Post procedure assessment: normal  Patient tolerated the procedure well with no immediate complications. Additional procedure comments: Dr. Hyacinth Meeker inserted A-line.

## 2018-11-04 NOTE — H&P (Signed)
TOTAL HIP ADMISSION H&P  Patient is admitted for right total hip arthroplasty.  Subjective:  Chief Complaint: right hip pain  HPI: [name] [name], 58 y.o. male, has a history of pain and functional disability in the right hip(s) due to arthritis and patient has failed non-surgical conservative treatments for greater than 12 weeks to include NSAID's and/or analgesics, weight reduction as appropriate and activity modification.  Onset of symptoms was gradual starting 3 years ago with gradually worsening course since that time.The patient noted no past surgery on the right hip(s).  Patient currently rates pain in the right hip at 9 out of 10 with activity. Patient has night pain, worsening of pain with activity and weight bearing, trendelenberg gait, pain that interfers with activities of daily living, pain with passive range of motion and joint swelling. Patient has evidence of subchondral sclerosis, periarticular osteophytes and joint space narrowing by imaging studies. This condition presents safety issues increasing the risk of falls. This patient has had Failure of all reasonable conservative care.  There is no current active infection.  Patient Active Problem List   Diagnosis Date Noted  . Primary osteoarthritis of right hip 11/03/2018  . VT (ventricular tachycardia) (HCC) 05/30/2017  . AKI (acute kidney injury) (HCC)   . Acute on chronic systolic CHF (congestive heart failure) (HCC) 01/29/2017  . HTN (hypertension) 12/25/2016  . Obesity 12/25/2016  . OSA (obstructive sleep apnea) 12/25/2016  . Hyperglycemia 12/25/2016  . Acute respiratory failure with hypoxia (HCC) 12/25/2016  . Chronic anticoagulation 06/27/2016  . Encounter for therapeutic drug monitoring 04/19/2015  . S/P ICD (internal cardiac defibrillator) procedure Medtronic CRT ICD 04/15/2015  . Atrial fibrillation with RVR - Paroxysmal 04/08/2015  . NICM- EF 25% 2012 & 20-25% 2016 04/06/2015  . Troponin level elevated 04/06/2015  .  Rectal bleeding 10/26/2011  . Hydrocele, left 05/24/2011  . Tobacco use disorder- 1/2 pk a day   . H/O ETOH abuse 6-8 beers a day   . Obesity 09/27/2008  . CAD- minor CAD 2009 and 2016 09/27/2008  . VENTRICULAR TACHYCARDIA 09/27/2008  . Essential hypertension 06/19/2008  . Chronic systolic heart failure (HCC) 06/19/2008   Past Medical History:  Diagnosis Date  . AICD (automatic cardioverter/defibrillator) present   . Arthritis   . CAD (coronary artery disease)    Mild nonobstructive (Cath 09)  . Chronic systolic heart failure (HCC)    Nonischemic CM: echo 4/12 with EF 25-30%, grade 2 diast dysfxn, mild dilated aortic root 43 mm, trivial MR, mod LAE  . COPD (chronic obstructive pulmonary disease) (HCC)   . Dyspnea   . H/O ETOH abuse   . HTN (hypertension)   . Hypersomnia   . Obesity, morbid (HCC)   . OSA on CPAP   . Systolic and diastolic CHF, acute on chronic (HCC) 11/2016  . Tobacco use disorder   . Ventricular tachycardia [facility])     Past Surgical History:  Procedure Laterality Date  . CARDIAC CATHETERIZATION N/A 04/08/2015   Procedure: Right/Left Heart Cath and Coronary Angiography;  Surgeon: Corky Crafts, MD;  Location: [facility] INVASIVE CV LAB;  Service: Cardiovascular;  Laterality: N/A;  . EP IMPLANTABLE DEVICE N/A 04/14/2015   Procedure: BiV ICD Insertion CRT-D;  Surgeon: [name], MD;  Location: [facility] INVASIVE CV LAB;  Service: Cardiovascular;  Laterality: N/A;  . HYDROCELE EXCISION / REPAIR    . TUMOR EXCISION Left   . 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 VASECTOMY  Current Facility-Administered Medications  Medication Dose Route Frequency Provider Last Rate Last Dose  . bupivacaine liposome (EXPAREL) 1.3 % injection 133 mg  10 mL Infiltration Once [name], MD      . ceFAZolin (ANCEF) 3 g in dextrose 5 % 50 mL IVPB  3 g Intravenous On Call to OR [name], MD      . chlorhexidine (HIBICLENS) 4 % liquid 4 application  60 mL  Topical Once [name], MD      . lactated ringers infusion   Intravenous Continuous Lowella Curb, MD 50 mL/hr at 11/04/18 0801    . tranexamic acid (CYKLOKAPRON) IVPB 1,000 mg  1,000 mg Intravenous To OR Bron Snellings, MD      . vancomycin (VANCOCIN) 1,500 mg in sodium chloride 0.9 % 500 mL IVPB  1,500 mg Intravenous Once [name], MD       No Known Allergies  Social History   Tobacco Use  . Smoking status: Former Smoker    Packs/day: 0.50    Years: 4.00    Pack years: 2.00    Types: Cigarettes    Last attempt to quit: 10/29/2005    Years since quitting: 13.0  . Smokeless tobacco: Never Used  . Tobacco comment: about a pack a week. social  Substance Use Topics  . Alcohol use: Yes    Alcohol/week: 42.0 standard drinks    Types: 42 Cans of beer per week    Comment: 24 a week    Family History  Problem Relation Age of Onset  . Other Mother        cardiac surgery. late 1990s  . Heart Problems Mother        CABG AGE 49  . Congestive Heart Failure Father   . Healthy Father        AGE 9  . Coronary artery disease Other   . Healthy Brother        AGE 27  . Healthy Sister        AGE 29  . Healthy Sister        AGE 67  . Healthy Son   . Healthy Son   . Healthy Daughter      ROS ROS: I have reviewed the patient's review of systems thoroughly and there are no positive responses as relates to the HPI. Objective:  Physical Exam  Vital signs in last 24 hours: Temp:  [98.3 F (36.8 C)] 98.3 F (36.8 C) (02/17 0804) Pulse Rate:  [83] 83 (02/17 0804) Resp:  [18] 18 (02/17 0804) BP: (127)/(86) 127/86 (02/17 0804) SpO2:  [96 %] 96 % (02/17 0804) Weight:  [140.6 kg] 140.6 kg (02/17 0743) Well-developed well-nourished patient in no acute distress. Alert and oriented x3 HEENT:within normal limits Cardiac: Regular rate and rhythm Pulmonary: Lungs clear to auscultation Abdomen: Soft and nontender.  Normal active bowel sounds  Musculoskeletal: (Right hip: Painful  range of motion.  Limited range of motion.  No instability.  Neurovascular intact distally. Labs: Recent Results (from the past 2160 hour(s))  CUP PACEART REMOTE DEVICE CHECK     Status: None   Collection Time: 09/30/18  5:17 AM  Result Value Ref Range   Date Time Interrogation Session [id number]    Pulse Generator Manufacturer [facility]    Pulse Gen Model [id number] Ovidio Kin XT CRT-D    Pulse Gen Serial Number [id number]    Clinic Name [facility]    Implantable Pulse Generator Type Cardiac Resynch Therapy Defibulator  Implantable Pulse Generator Implant Date [id number]    Implantable Lead Manufacturer MERM    Implantable Lead Model 4598 Gemma Payor S MRI SureScan    Implantable Lead Serial Number [id number]    Implantable Lead Implant Date 79728206    Implantable Lead Location Detail 1 UNKNOWN    Implantable Lead Location [id number]    Implantable Lead Manufacturer MERM    Implantable Lead Model 5076 CapSureFix Novus    Implantable Lead Serial Number [id number]    Implantable Lead Implant Date 01561537    Implantable Lead Location Detail 1 APPENDAGE    Implantable Lead Location [id number]    Implantable Lead Manufacturer [facility]    Implantable Lead Model 6935 Sprint Quattro Secure S    Implantable Lead Serial Number [id number]    Implantable Lead Implant Date 94327614    Implantable Lead Location Detail 1 APEX    Implantable Lead Location [id number]    Lead Channel Setting Sensing Sensitivity 0.3 mV   Lead Channel Setting Pacing Amplitude 1.5 V   Lead Channel Setting Pacing Pulse Width 0.4 ms   Lead Channel Setting Pacing Amplitude 2 V   Lead Channel Setting Pacing Pulse Width 0.6 ms   Lead Channel Setting Pacing Amplitude 2.5 V   Lead Channel Setting Pacing Capture Mode Adaptive Capture    Lead Channel Impedance Value 456 ohm   Lead Channel Sensing Intrinsic Amplitude 1.875 mV   Lead Channel Sensing Intrinsic Amplitude 1.875 mV   Lead Channel Pacing Threshold Amplitude 0.5 V   Lead  Channel Pacing Threshold Pulse Width 0.4 ms   Lead Channel Impedance Value 513 ohm   Lead Channel Impedance Value 456 ohm   Lead Channel Sensing Intrinsic Amplitude 14.375 mV   Lead Channel Sensing Intrinsic Amplitude 14.375 mV   Lead Channel Pacing Threshold Amplitude 0.5 V   Lead Channel Pacing Threshold Pulse Width 0.4 ms   HighPow Impedance 77 ohm   Lead Channel Impedance Value 589 ohm   Lead Channel Impedance Value 608 ohm   Lead Channel Impedance Value 608 ohm   Lead Channel Impedance Value 418 ohm   Lead Channel Impedance Value 589 ohm   Lead Channel Impedance Value 551 ohm   Lead Channel Impedance Value 399 ohm   Lead Channel Impedance Value 361 ohm   Lead Channel Impedance Value 342 ohm   Lead Channel Impedance Value 342 ohm   Lead Channel Pacing Threshold Amplitude 1.5 V   Lead Channel Pacing Threshold Pulse Width 0.6 ms   Battery Status OK    Battery Remaining Longevity 29 mo   Battery Voltage 2.95 V   Brady Statistic RA Percent Paced 53.25 %   Brady Statistic RV Percent Paced 96.98 %   Brady Statistic AP VP Percent 53.93 %   Brady Statistic AS VP Percent 44.63 %   Brady Statistic AP VS Percent 0.23 %   Brady Statistic AS VS Percent 1.21 %  Basic Metabolic Panel (BMET)     Status: None   Collection Time: 10/02/18 11:06 AM  Result Value Ref Range   Glucose 89 65 - 99 mg/dL   BUN 15 6 - 24 mg/dL   Creatinine, Ser 7.09 0.76 - 1.27 mg/dL   GFR calc non Af Amer 88 >59 mL/min/1.73   GFR calc Af Amer 101 >59 mL/min/1.73   BUN/Creatinine Ratio 15 9 - 20   Sodium 140 134 - 144 mmol/L   Potassium 4.4 3.5 - 5.2 mmol/L   Chloride 99 96 - 106 mmol/L  CO2 25 20 - 29 mmol/L   Calcium 9.6 8.7 - 10.2 mg/dL  CBC     Status: Abnormal   Collection Time: 10/02/18 11:06 AM  Result Value Ref Range   WBC 4.8 3.4 - 10.8 x10E3/uL   RBC 3.69 (L) 4.14 - 5.80 x10E6/uL   Hemoglobin 12.5 (L) 13.0 - 17.7 g/dL   Hematocrit 18.8 (L) 41.6 - 51.0 %   MCV 95 79 - 97 fL   MCH 33.9 (H) 26.6 -  33.0 pg   MCHC 35.5 31.5 - 35.7 g/dL   RDW 60.6 30.1 - 60.1 %    Comment:               **Please note reference interval change**   Platelets 200 150 - 450 x10E3/uL  Magnesium     Status: Abnormal   Collection Time: 10/02/18 11:06 AM  Result Value Ref Range   Magnesium 2.4 (H) 1.6 - 2.3 mg/dL  CUP PACEART INCLINIC DEVICE CHECK     Status: None   Collection Time: 10/02/18  4:01 PM  Result Value Ref Range   Pulse Generator Manufacturer MERM    Date Time Interrogation Session [phone]    Pulse Gen Model DTBA1Q1 Ovidio Kin XT CRT-D    Pulse Gen Serial Number [id number]    Clinic Name [facility]    Implantable Pulse Generator Type Cardiac Resynch Therapy Defibulator    Implantable Pulse Generator Implant Date [id number]    Implantable Lead Manufacturer MERM    Implantable Lead Model 4598 Attain Performa S MRI SureScan    Implantable Lead Serial Number [id number]    Implantable Lead Implant Date 16073710    Implantable Lead Location Detail 1 UNKNOWN    Implantable Lead Location [id number]    Implantable Lead Manufacturer MERM    Implantable Lead Model 5076 CapSureFix Novus    Implantable Lead Serial Number [id number]    Implantable Lead Implant Date 62694854    Implantable Lead Location Detail 1 APPENDAGE    Implantable Lead Location [id number]    Implantable Lead Manufacturer [facility]    Implantable Lead Model [id number] Sprint Quattro Secure S    Implantable Lead Serial Number [id number]    Implantable Lead Implant Date 62703500    Implantable Lead Location Detail 1 APEX    Implantable Lead Location [id number]   APTT     Status: None   Collection Time: 10/29/18  2:52 PM  Result Value Ref Range   aPTT 27 24 - 36 seconds    Comment: Performed at [facility], [address] [place], [place] [address]  CBC WITH DIFFERENTIAL     Status: Abnormal   Collection Time: 10/29/18  2:52 PM  Result Value Ref Range   WBC 4.5 4.0 - 10.5 K/uL   RBC 3.81 (L) 4.22 - 5.81 MIL/uL    Hemoglobin 12.9 (L) 13.0 - 17.0 g/dL   HCT 29.9 (L) 37.1 - 69.6 %   MCV 99.7 80.0 - 100.0 fL   MCH 33.9 26.0 - 34.0 pg   MCHC 33.9 30.0 - 36.0 g/dL   RDW 78.9 38.1 - 01.7 %   Platelets 175 150 - 400 K/uL   nRBC 0.0 0.0 - 0.2 %   Neutrophils Relative % 60 %   Neutro Abs 2.7 1.7 - 7.7 K/uL   Lymphocytes Relative 29 %   Lymphs Abs 1.3 0.7 - 4.0 K/uL   Monocytes Relative 7 %   Monocytes Absolute 0.3 0.1 - 1.0 K/uL   Eosinophils  Relative 4 %   Eosinophils Absolute 0.2 0.0 - 0.5 K/uL   Basophils Relative 0 %   Basophils Absolute 0.0 0.0 - 0.1 K/uL   Immature Granulocytes 0 %   Abs Immature Granulocytes 0.02 0.00 - 0.07 K/uL    Comment: Performed at [facility], [address] [place], [place] [address]  Comprehensive metabolic panel     Status: Abnormal   Collection Time: 10/29/18  2:52 PM  Result Value Ref Range   Sodium 138 135 - 145 mmol/L   Potassium 3.7 3.5 - 5.1 mmol/L   Chloride 101 98 - 111 mmol/L   CO2 27 22 - 32 mmol/L   Glucose, Bld 108 (H) 70 - 99 mg/dL   BUN 16 6 - 20 mg/dL   Creatinine, Ser 1.910.94 0.61 - 1.24 mg/dL   Calcium 9.1 8.9 - 47.810.3 mg/dL   Total Protein 7.0 6.5 - 8.1 g/dL   Albumin 4.5 3.5 - 5.0 g/dL   AST 24 15 - 41 U/L   ALT 26 0 - 44 U/L   Alkaline Phosphatase 81 38 - 126 U/L   Total Bilirubin 1.4 (H) 0.3 - 1.2 mg/dL   GFR calc non Af Amer >60 >60 mL/min   GFR calc Af Amer >60 >60 mL/min   Anion gap 10 5 - 15    Comment: Performed at [facility], [address] [place], [place] [address]  Protime-INR     Status: None   Collection Time: 10/29/18  2:52 PM  Result Value Ref Range   Prothrombin Time 12.2 11.4 - 15.2 seconds   INR 0.91     Comment: Performed at [facility], [address] [place], [place] [address]  Type and screen Order type and screen if day of surgery is less than 15 days from draw of preadmission visit or order morning of surgery if day of surgery is greater than 6 days from  preadmission visit.     Status: None   Collection Time: 10/29/18  2:52 PM  Result Value Ref Range   ABO/RH(D) O POS    Antibody Screen NEG    Sample Expiration 11/07/2018    Extend sample reason      NO TRANSFUSIONS OR PREGNANCY IN THE PAST 3 MONTHS Performed at [facility], [address] [place], [place] [address]   Urinalysis, Routine w reflex microscopic     Status: None   Collection Time: 10/29/18  2:52 PM  Result Value Ref Range   Color, Urine YELLOW YELLOW   APPearance CLEAR CLEAR   Specific Gravity, Urine 1.006 1.005 - 1.030   pH 7.0 5.0 - 8.0   Glucose, UA NEGATIVE NEGATIVE mg/dL   Hgb urine dipstick NEGATIVE NEGATIVE   Bilirubin Urine NEGATIVE NEGATIVE   Ketones, ur NEGATIVE NEGATIVE mg/dL   Protein, ur NEGATIVE NEGATIVE mg/dL   Nitrite NEGATIVE NEGATIVE   Leukocytes,Ua NEGATIVE NEGATIVE    Comment: Performed at [facility], [address] [place], [place] [address]  Surgical pcr screen     Status: Abnormal   Collection Time: 10/29/18  2:52 PM  Result Value Ref Range   MRSA, PCR POSITIVE (A) NEGATIVE    Comment: RESULT CALLED TO, READ BACK BY AND VERIFIED WITH: PHILLIPS,K @ [id number] ON [id number] BY POTEAT,S    Staphylococcus aureus POSITIVE (A) NEGATIVE    Comment: (NOTE) The Xpert SA Assay (FDA approved for NASAL specimens in patients 56 years of age and  older), is one component of a comprehensive surveillance program. It is not intended to diagnose infection nor to guide or monitor treatment. Performed at [facility], [address], [place], [place] [address]   ABO/Rh     Status: None   Collection Time: 10/29/18  2:52 PM  Result Value Ref Range   ABO/RH(D)      O POS Performed at [facility], [address] [place], [place] [address]     Estimated body mass index is 37.73 kg/m as calculated from the following:   Height as of this encounter: 6\' 4"  (1.93 m).   Weight as of this  encounter: 140.6 kg.   Imaging Review Plain radiographs demonstrate severe degenerative joint disease of the right hip(s). The bone quality appears to be good for age and reported activity level.      Assessment/Plan:  End stage arthritis, right hip(s)  The patient history, physical examination, clinical judgement of the provider and imaging studies are consistent with end stage degenerative joint disease of the right hip(s) and total hip arthroplasty is deemed medically necessary. The treatment options including medical management, injection therapy, arthroscopy and arthroplasty were discussed at length. The risks and benefits of total hip arthroplasty were presented and reviewed. The risks due to aseptic loosening, infection, stiffness, dislocation/subluxation,  thromboembolic complications and other imponderables were discussed.  The patient acknowledged the explanation, agreed to proceed with the plan and consent was signed. Patient is being admitted for inpatient treatment for surgery, pain control, PT, OT, prophylactic antibiotics, VTE prophylaxis, progressive ambulation and ADL's and discharge planning.The patient is planning to be discharged home with home health services    Patient's anticipated LOS is less than 2 midnights, meeting these requirements: - Younger than 15 - Lives within 1 hour of care - Has a competent adult at home to recover with post-op recover - NO history of  - Chronic pain requiring opiods  - Diabetes  - Coronary Artery Disease  - Heart failure  - Heart attack  - Stroke  - DVT/VTE  - Cardiac arrhythmia  - Respiratory Failure/COPD  - Renal failure  - Anemia  - Advanced Liver disease

## 2018-11-05 ENCOUNTER — Encounter (HOSPITAL_COMMUNITY): Payer: Self-pay | Admitting: Orthopedic Surgery

## 2018-11-05 DIAGNOSIS — M1611 Unilateral primary osteoarthritis, right hip: Secondary | ICD-10-CM | POA: Diagnosis not present

## 2018-11-05 LAB — BASIC METABOLIC PANEL
Anion gap: 9 (ref 5–15)
BUN: 15 mg/dL (ref 6–20)
CO2: 27 mmol/L (ref 22–32)
Calcium: 8.7 mg/dL — ABNORMAL LOW (ref 8.9–10.3)
Chloride: 100 mmol/L (ref 98–111)
Creatinine, Ser: 1.05 mg/dL (ref 0.61–1.24)
GFR calc Af Amer: >60 mL/min (ref >60)
GFR calc non Af Amer: >60 mL/min (ref >60)
Glucose, Bld: 144 mg/dL — ABNORMAL HIGH (ref 70–99)
Potassium: 4 mmol/L (ref 3.5–5.1)
Sodium: 136 mmol/L (ref 135–145)

## 2018-11-05 LAB — CBC
HEMATOCRIT: 31.5 % — AB (ref 39.0–52.0)
Hemoglobin: 10.5 g/dL — ABNORMAL LOW (ref 13.0–17.0)
MCH: 32.9 pg (ref 26.0–34.0)
MCHC: 33.3 g/dL (ref 30.0–36.0)
MCV: 98.7 fL (ref 80.0–100.0)
Platelets: 145 10*3/uL — ABNORMAL LOW (ref 150–400)
RBC: 3.19 MIL/uL — ABNORMAL LOW (ref 4.22–5.81)
RDW: 13 % (ref 11.5–15.5)
WBC: 8.6 10*3/uL (ref 4.0–10.5)
nRBC: 0 % (ref 0.0–0.2)

## 2018-11-05 MED ORDER — MUPIROCIN 2 % EX OINT
TOPICAL_OINTMENT | Freq: Two times a day (BID) | CUTANEOUS | Status: DC
  Filled 2018-11-05: qty 22

## 2018-11-05 NOTE — Care Plan (Signed)
Seen on rounds. Doing very well. Up to chair. Anticipate discharge to home with family as planned after therapy.  Equipment to be delivered to room prior to discharge.   Luster Landsberg Angiulli RNCM  [phone]

## 2018-11-05 NOTE — Progress Notes (Signed)
EPIC Encounter for ICM Monitoring  Patient Name: [name] [name] is a 58 y.o. male Date: 11/05/2018 Primary Care Physican: [name], MD Primary Cardiologist:[name] Electrophysiologist:[name] Bi-V Pacing:98.7% Last Weight:310lbs  Today's Weight:unknown    Transmission reviewed.  Patient currently hospitalized for total hip replacement.  Thoracic impedance normal.   Prescribed:Torsemide20 mg 3 tablets (60 mg total) twice a day. Potassium 42mq 2 tablets (40 mEq total) twice a day.  Labs: 11/05/2018 Creatinine 1.05, BUN 15, Potassium 4.3, Sodium 136, GFR >60 10/29/2018 Creatinine 0.94, BUN 16, Potassium 3.7, Sodium 138, GFR >60  10/02/2018 Creatinine 0.97, BUN 15, Potassium 4.4, Sodium 140, GFR 88-1010  09/25/2017 Creatinine1.23, BUN22, Potassium4.2, [id number] EGFR>60 08/13/2018 Creatinine1.19, BUN16, Potassium4.4, Sodium136, EGFR>60 A complete set of results can be found in Results Review.  Recommendations:None.  Follow-up plan: ICM clinic phone appointment on3/01/2019 to recheck fluid levels post surgery.    Copy of ICM check sent to [place]  3 month ICM trend: 11/04/2018    1 Year ICM trend:       [name] RN 11/05/2018 12:56 PM

## 2018-11-05 NOTE — Progress Notes (Addendum)
Subjective: 1 Day Post-Op Procedure(s) (LRB): TOTAL HIP ARTHROPLASTY ANTERIOR APPROACH (Right) Patient reports pain as mild.Taking by mouth and voiding okay.  Wants to go home today.  No significant complaints.  No chest pain or shortness of breath.The patient reports that he has not been taking Xarelto since early January.  He could not afford it and was unable to get the medication.  Objective: Vital signs in last 24 hours: Temp:  [97.4 F (36.3 C)-98.7 F (37.1 C)] 98.3 F (36.8 C) (02/18 1026) Pulse Rate:  [59-74] 69 (02/18 1026) Resp:  [10-22] 16 (02/18 1026) BP: (107-177)/(63-121) 117/67 (02/18 1026) SpO2:  [87 %-99 %] 96 % (02/18 1026) Arterial Line BP: (149-168)/(79-98) 149/79 (02/17 1400) Weight:  [140 kg] 140 kg (02/17 1525)  Intake/Output from previous day: 02/17 0701 - 02/18 0700 In: 4449.8 [P.O.:1000; I.V.:2649.8; IV Piggyback:800] Out: 4500 [Urine:4050; Blood:450] Intake/Output this shift: Total I/O In: 120 [P.O.:120] Out: -   Recent Labs    11/05/18 0449  HGB 10.5*   Recent Labs    11/05/18 0449  WBC 8.6  RBC 3.19*  HCT 31.5*  PLT 145*   Recent Labs    11/05/18 0449  NA 136  K 4.0  CL 100  CO2 27  BUN 15  CREATININE 1.05  GLUCOSE 144*  CALCIUM 8.7*   No results for input(s): LABPT, INR in the last 72 hours. Right hip exam: Neurovascular intact Sensation intact distally Intact pulses distally Dorsiflexion/Plantar flexion intact Incision: dressing C/D/I Compartment soft   Assessment/Plan: 1 Day Post-Op Procedure(s) (LRB): TOTAL HIP ARTHROPLASTY ANTERIOR APPROACH (Right)  Plan: Aspirin 325 mg twice daily for DVT prophylaxis 1 month postop. Weight-bear as tolerated on right without hip precautions. Up with therapy Discharge home with home health  Follow-up with Dr. Luiz Blare in 10-14 days.     [name] [name] 11/05/2018, 12:57 PM

## 2018-11-05 NOTE — Progress Notes (Signed)
Pt stated he did the mupirocin ointment treatment for 5 days , twice a day for staph and MRSA positive.

## 2018-11-05 NOTE — Progress Notes (Signed)
Physical Therapy Treatment Patient Details Name: [name] [name] MRN: [id number] DOB: [birth year] Today's Date: 11/05/2018    History of Present Illness 58 yo male s/p R DA-THA on 11/04/18. PMH includes OA, VTACH, AKI, CHF, HTN, obesity, ICD, afib, ETOH abuse.     PT Comments    POD # 1pm session Assisted OOB Demonstrated and instructed pt how to use belt to self assist LE as a leg lifter Assisted with amb then returned to room to finish remaining TE's following HEP handout.   Pt ready for D/C to home.    Follow Up Recommendations  Follow surgeon's recommendation for DC plan and follow-up therapies;Supervision for mobility/OOB     Equipment Recommendations  Rolling walker with 5" wheels;3in1 (PT)    Recommendations for Other Services       Precautions / Restrictions Precautions Precautions: Fall Restrictions Weight Bearing Restrictions: No Other Position/Activity Restrictions: WBAT     Mobility  Bed Mobility Overal bed mobility: Needs Assistance Bed Mobility: Supine to Sit;Sit to Supine     Supine to sit: Min guard Sit to supine: Min guard   General bed mobility comments: demonstarted and instructed how to use belt to self assist LE up onto bed and off  Transfers Overall transfer level: Needs assistance Equipment used: Rolling walker (2 wheeled) Transfers: Sit to/from [employer] Sit to Stand: From elevated surface;Min assist Stand pivot transfers: Supervision;Min guard       General transfer comment: 25% VC's on proper hand placement and safety with stand to sit to extend R LE to decrease pain   Ambulation/Gait Ambulation/Gait assistance: Supervision;Min guard Gait Distance (Feet): 135 Feet Assistive device: Rolling walker (2 wheeled) Gait Pattern/deviations: Step-to pattern;Decreased stance time - right;Decreased weight shift to right Gait velocity: decreased    General Gait Details: tolerated an increased distance with < 25% VC's on proper  walker to self distance and safety with turns    Stairs Stairs: (ramp)           Wheelchair Mobility    Modified Rankin (Stroke Patients Only)       Balance                                            Cognition Arousal/Alertness: Awake/alert Behavior During Therapy: WFL for tasks assessed/performed Overall Cognitive Status: Within Functional Limits for tasks assessed                                        Exercises   10 steps all standing TE's    General Comments        Pertinent Vitals/Pain Pain Assessment: 0-10 Pain Score: 5  Pain Location: R hip, with mobility  Pain Descriptors / Indicators: Sore;Operative site guarding Pain Intervention(s): Monitored during session;Repositioned;Ice applied;Premedicated before session    Home Living                      Prior Function            PT Goals (current goals can now be found in the care plan section) Progress towards PT goals: Progressing toward goals    Frequency    7X/week      PT Plan Current plan remains appropriate    Co-evaluation  AM-PAC PT "6 Clicks" Mobility   Outcome Measure  Help needed turning from your back to your side while in a flat bed without using bedrails?: A Little Help needed moving from lying on your back to sitting on the side of a flat bed without using bedrails?: A Little Help needed moving to and from a bed to a chair (including a wheelchair)?: A Little Help needed standing up from a chair using your arms (e.g., wheelchair or bedside chair)?: A Little Help needed to walk in hospital room?: A Little Help needed climbing 3-5 steps with a railing? : A Lot 6 Click Score: 17    End of Session Equipment Utilized During Treatment: Gait belt Activity Tolerance: Patient tolerated treatment well;Patient limited by pain;Treatment limited secondary to medical complications (Comment) Patient left: in bed;with call  bell/phone within reach;with bed alarm set   PT Visit Diagnosis: Other abnormalities of gait and mobility (R26.89);Difficulty in walking, not elsewhere classified (R26.2)     Time: 1130-1155 PT Time Calculation (min) (ACUTE ONLY): 25 min  Charges:  $Gait Training: 8-22 mins $Therapeutic Exercise: 8-22 mins                     Felecia Shelling  PTA Acute  Rehabilitation Services Pager      [phone] Office      [phone]

## 2018-11-05 NOTE — Discharge Summary (Signed)
Patient ID: [name] [name] MRN: [id number] DOB/AGE: 56-Nov-1962 56 y.o.  Admit date: 11/04/2018 Discharge date: 11/05/2018  Admission Diagnoses:  Principal Problem:   Primary osteoarthritis of right hip   Discharge Diagnoses:  Same  Past Medical History:  Diagnosis Date  . AICD (automatic cardioverter/defibrillator) present   . Arthritis   . CAD (coronary artery disease)    Mild nonobstructive (Cath 09)  . Chronic systolic heart failure (HCC)    Nonischemic CM: echo 4/12 with EF 25-30%, grade 2 diast dysfxn, mild dilated aortic root 43 mm, trivial MR, mod LAE  . COPD (chronic obstructive pulmonary disease) (HCC)   . Dyspnea   . H/O ETOH abuse   . HTN (hypertension)   . Hypersomnia   . Obesity, morbid (HCC)   . OSA on CPAP   . Systolic and diastolic CHF, acute on chronic (HCC) 11/2016  . Tobacco use disorder   . Ventricular tachycardia (HCC)     Surgeries: Procedure(s):Right TOTAL HIP ARTHROPLASTY ANTERIOR APPROACH on 11/04/2018   Consultants:   Discharged Condition: Improved  Hospital Course: [name] [name] is an 58 y.o. male who was admitted 11/04/2018 for operative treatment ofPrimary osteoarthritis of right hip. Patient has severe unremitting pain that affects sleep, daily activities, and work/hobbies. After pre-op clearance the patient was taken to the operating room on 11/04/2018 and underwent  Procedure(s):Right TOTAL HIP ARTHROPLASTY ANTERIOR APPROACH.    Patient was given perioperative antibiotics:  Anti-infectives (From admission, onward)   Start     Dose/Rate Route Frequency Ordered Stop   11/04/18 2100  vancomycin (VANCOCIN) IVPB 1000 mg/200 mL premix     1,000 mg 200 mL/hr over 60 Minutes Intravenous Every 12 hours 11/04/18 1533 11/04/18 2331   11/04/18 0600  vancomycin (VANCOCIN) 1,500 mg in sodium chloride 0.9 % 500 mL IVPB     1,500 mg 250 mL/hr over 120 Minutes Intravenous  Once 11/03/18 0758 11/04/18 1114   11/04/18 0600  ceFAZolin (ANCEF) 3 g in dextrose 5 %  50 mL IVPB     3 g 100 mL/hr over 30 Minutes Intravenous On call to O.R. 11/03/18 0758 11/04/18 1023       Patient was given sequential compression devices, early ambulation, and chemoprophylaxis to prevent DVT.  Patient benefited maximally from hospital stay and there were no complications.    Recent vital signs:  Patient Vitals for the past 24 hrs:  BP Temp Temp src Pulse Resp SpO2 Height Weight  11/05/18 1026 117/67 98.3 F (36.8 C) Oral 69 16 96 % - -  11/05/18 1006 113/63 98.7 F (37.1 C) Oral 72 16 96 % - -  11/05/18 0534 118/68 98.5 F (36.9 C) Oral 66 16 98 % - -  11/05/18 0038 114/68 98.1 F (36.7 C) Oral 64 16 96 % - -  11/04/18 1959 124/76 (!) 97.4 F (36.3 C) Oral 74 15 97 % - -  11/04/18 1841 128/74 98.5 F (36.9 C) Axillary 69 16 98 % - -  11/04/18 1710 (!) 141/90 - - 65 18 98 % - -  11/04/18 1642 128/79 97.6 F (36.4 C) Axillary 63 18 99 % - -  11/04/18 1525 122/75 97.6 F (36.4 C) Oral 60 20 95 % - -  11/04/18 1525 - - - - - - 6\' 4"  (1.93 m) (!) 140 kg  11/04/18 1500 - 97.9 F (36.6 C) - - - - - -  11/04/18 1445 - - - - - 92 % - -  11/04/18 1430 107/73  97.9 F (36.6 C) - (!) 59 10 92 % - -  11/04/18 1415 121/77 - - 63 12 95 % - -  11/04/18 1400 126/84 - - 69 14 97 % - -  11/04/18 1345 137/89 - - 66 10 (!) 87 % - -  11/04/18 1330 (!) 172/116 - - 65 (!) 22 96 % - -  11/04/18 1315 (!) 177/112 - - 71 18 97 % - -     Recent laboratory studies:  Recent Labs    11/05/18 0449  WBC 8.6  HGB 10.5*  HCT 31.5*  PLT 145*  NA 136  K 4.0  CL 100  CO2 27  BUN 15  CREATININE 1.05  GLUCOSE 144*  CALCIUM 8.7*     Discharge Medications:   Allergies as of 11/05/2018   No Known Allergies     Medication List    STOP taking these medications   rivaroxaban 20 MG Tabs tablet Commonly known as:  XARELTO     TAKE these medications   acetaminophen 325 MG tablet Commonly known as:  TYLENOL Take 2 tablets (650 mg total) by mouth every 4 (four) hours as  needed for headache or mild pain.   albuterol 108 (90 Base) MCG/ACT inhaler Commonly known as:  PROVENTIL HFA;VENTOLIN HFA Inhale 1 puff into the lungs every 6 (six) hours as needed for wheezing or shortness of breath.   amiodarone 200 MG tablet Commonly known as:  PACERONE TAKE 1 TABLET BY MOUTH ONCE DAILY   aspirin EC 325 MG tablet Take 1 tablet (325 mg total) by mouth 2 (two) times daily after a meal. Take x 1 month post op to decrease risk of blood clots.   atorvastatin 80 MG tablet Commonly known as:  LIPITOR TAKE 1 TABLET BY MOUTH ONCE DAILY AT 6PM   docusate sodium 100 MG capsule Commonly known as:  COLACE Take 1 capsule (100 mg total) by mouth 2 (two) times daily.   FLUoxetine 20 MG tablet Commonly known as:  PROZAC Take 20 mg by mouth daily.   oxyCODONE-acetaminophen 5-325 MG tablet Commonly known as:  PERCOCET/ROXICET Take 1-2 tablets by mouth every 6 (six) hours as needed for severe pain.   potassium chloride SA 20 MEQ tablet Commonly known as:  K-DUR,KLOR-CON TAKE 2 TABLETS BY MOUTH TWICE DAILY   sacubitril-valsartan 49-51 MG Commonly known as:  ENTRESTO Take 1 tablet by mouth 2 (two) times daily.   sotalol 80 MG tablet Commonly known as:  BETAPACE Take 1 tablet (80 mg total) by mouth every 12 (twelve) hours. Please keep upcoming appt for future refills. Thank you.   spironolactone 25 MG tablet Commonly known as:  ALDACTONE Take 1 tablet (25 mg total) by mouth daily.   tiZANidine 2 MG tablet Commonly known as:  ZANAFLEX Take 1 tablet (2 mg total) by mouth every 8 (eight) hours as needed for muscle spasms.   torsemide 20 MG tablet Commonly known as:  DEMADEX Take 3 tablets (60 mg total) by mouth 2 (two) times daily.            Durable Medical Equipment  (From admission, onward)         Start     Ordered   11/05/18 1031  For home use only DME Bedside commode  Once    Question:  Patient needs a bedside commode to treat with the following  condition  Answer:  Total knee replacement status   11/05/18 1031   11/05/18 1026  For  home use only DME Bedside commode  Once    Comments:  Elongated commode  Question:  Patient needs a bedside commode to treat with the following condition  Answer:  S/P hip replacement, right   11/05/18 1026           Discharge Care Instructions  (From admission, onward)         Start     Ordered   11/05/18 0000  Weight bearing as tolerated    Question Answer Comment  Laterality right   Extremity Lower      11/05/18 1300   11/05/18 0000  Weight bearing as tolerated    Question Answer Comment  Laterality right   Extremity Lower      11/05/18 1305          Diagnostic Studies: Dg Chest 2 View  Result Date: 10/30/2018 CLINICAL DATA:  Pre-op respiratory exam. Congestive heart failure. COPD. Former smoker. EXAM: CHEST - 2 VIEW COMPARISON:  05/30/2017 FINDINGS: The heart size and mediastinal contours are within normal limits. Both lungs are clear. The visualized skeletal structures are unremarkable. IMPRESSION: Stable exam.  No active cardiopulmonary disease. Electronically Signed   By: Myles Rosenthal M.D.   On: 10/30/2018 08:54   Dg C-arm 1-60 Min-no Report  Result Date: 11/04/2018 CLINICAL DATA:  58 year old male post right hip replacement. Initial encounter. EXAM: OPERATIVE right HIP (WITH PELVIS IF PERFORMED) 2 VIEWS TECHNIQUE: Fluoroscopic spot image(s) were submitted for interpretation post-operatively. Fluoroscopic time: 29 seconds. COMPARISON:  None. FINDINGS: Two intraoperative C-arm views of the right hip submitted for review after surgery. This reveals patient is post total right hip replacement which appears in satisfactory position without complication noted on frontal imaging. Overlying soft tissue gas slightly limits evaluation. IMPRESSION: Post total right hip replacement. Electronically Signed   By: Lacy Duverney M.D.   On: 11/04/2018 12:09   Dg Hip Operative Unilat W Or W/o Pelvis  Right  Result Date: 11/04/2018 CLINICAL DATA:  58 year old male post right hip replacement. Initial encounter. EXAM: OPERATIVE right HIP (WITH PELVIS IF PERFORMED) 2 VIEWS TECHNIQUE: Fluoroscopic spot image(s) were submitted for interpretation post-operatively. Fluoroscopic time: 29 seconds. COMPARISON:  None. FINDINGS: Two intraoperative C-arm views of the right hip submitted for review after surgery. This reveals patient is post total right hip replacement which appears in satisfactory position without complication noted on frontal imaging. Overlying soft tissue gas slightly limits evaluation. IMPRESSION: Post total right hip replacement. Electronically Signed   By: Lacy Duverney M.D.   On: 11/04/2018 12:09    Disposition: Discharge disposition: 01-Home or Self Care       Discharge Instructions    Call MD / Call 911   Complete by:  As directed    If you experience chest pain or shortness of breath, CALL 911 and be transported to the hospital emergency room.  If you develope a fever above 101 F, pus (white drainage) or increased drainage or redness at the wound, or calf pain, call your surgeon's office.   Call MD / Call 911   Complete by:  As directed    If you experience chest pain or shortness of breath, CALL 911 and be transported to the hospital emergency room.  If you develope a fever above 101 F, pus (white drainage) or increased drainage or redness at the wound, or calf pain, call your surgeon's office.   Constipation Prevention   Complete by:  As directed    Drink plenty of fluids.  Prune juice  may be helpful.  You may use a stool softener, such as Colace (over the counter) 100 mg twice a day.  Use MiraLax (over the counter) for constipation as needed.   Constipation Prevention   Complete by:  As directed    Drink plenty of fluids.  Prune juice may be helpful.  You may use a stool softener, such as Colace (over the counter) 100 mg twice a day.  Use MiraLax (over the counter) for  constipation as needed.   Diet general   Complete by:  As directed    Diet general   Complete by:  As directed    Do not sit on low chairs, stoools or toilet seats, as it may be difficult to get up from low surfaces   Complete by:  As directed    Do not sit on low chairs, stoools or toilet seats, as it may be difficult to get up from low surfaces   Complete by:  As directed    Increase activity slowly as tolerated   Complete by:  As directed    Increase activity slowly as tolerated   Complete by:  As directed    Weight bearing as tolerated   Complete by:  As directed    Laterality:  right   Extremity:  Lower   Weight bearing as tolerated   Complete by:  As directed    Laterality:  right   Extremity:  Lower      Follow-up Information    [facility], [facility]   Specialty:  Physical Therapy Why:  You are scheduled to start Outpatient physical therapy at 11:00. Please arrive a few minutes early to complete your paperwork  Contact information: [address] [place] [address] [phone]        Marshia LyBethune, Mayjor Ager, [place]-C. Schedule an appointment as soon as possible for a visit on 11/14/2018.   Specialty:  Orthopedic Surgery Contact information: [facility] [address] [place] [address] [phone]            Signed: Matthew FolksJames G Sanyiah Kanzler 11/05/2018, 1:05 PM

## 2018-11-05 NOTE — Progress Notes (Signed)
Patient wore Cpap overnight. Pain controlled, vitals stable.

## 2018-11-05 NOTE — Care Management Obs Status (Signed)
MEDICARE OBSERVATION STATUS NOTIFICATION   Patient Details  Name: [name] [name] MRN: [id number] Date of Birth: 01-04-1961   Medicare Observation Status Notification Given:  Yes    Golda Acre, RN 11/05/2018, 11:40 AM

## 2018-11-05 NOTE — Progress Notes (Signed)
Physical Therapy Treatment Patient Details Name: [name] [name] MRN: [id number] DOB: 10/27/1960 Today's Date: 11/05/2018    History of Present Illness 58 yo male s/p R DA-THA on 11/04/18. PMH includes OA, VTACH, AKI, CHF, HTN, obesity, ICD, afib, ETOH abuse.     PT Comments    POD # 1 am session Assisted out of recliner to amb a greater distance in hallway.  General Gait Details: tolerated an increased distance with < 25% VC's on proper walker to self distance and safety with turns .  Assisted back to bed to Then returned to room to perform some TE's following HEP handout.  Instructed on proper tech, freq as well as use of ICE.     Follow Up Recommendations  Follow surgeon's recommendation for DC plan and follow-up therapies;Supervision for mobility/OOB     Equipment Recommendations  Rolling walker with 5" wheels;3in1 (PT)    Recommendations for Other Services       Precautions / Restrictions Precautions Precautions: Fall Restrictions Weight Bearing Restrictions: No Other Position/Activity Restrictions: WBAT     Mobility  Bed Mobility Overal bed mobility: Needs Assistance Bed Mobility: Sit to Supine       Sit to supine: Min guard   General bed mobility comments: demonstarted and instructed how to use belt to self assist LE up onto bed   Transfers Overall transfer level: Needs assistance Equipment used: Rolling walker (2 wheeled) Transfers: Sit to/from [employer] Transfers Sit to Stand: From elevated surface;Min assist Stand pivot transfers: Supervision;Min guard       General transfer comment: 25% VC's on proper hand placement and safety with stand to sit to extend R LE to decrease pain   Ambulation/Gait Ambulation/Gait assistance: Supervision;Min guard Gait Distance (Feet): 115 Feet Assistive device: Rolling walker (2 wheeled) Gait Pattern/deviations: Step-to pattern;Decreased stance time - right;Decreased weight shift to right Gait velocity: decreased    General Gait Details: tolerated an increased distance with < 25% VC's on proper walker to self distance and safety with turns    Stairs Stairs: (pt has a ramp)           Wheelchair Mobility    Modified Rankin (Stroke Patients Only)       Balance                                            Cognition Arousal/Alertness: Awake/alert Behavior During Therapy: WFL for tasks assessed/performed Overall Cognitive Status: Within Functional Limits for tasks assessed                                        Exercises      General Comments        Pertinent Vitals/Pain Pain Assessment: 0-10 Pain Score: 5  Pain Location: R hip, with mobility  Pain Descriptors / Indicators: Sore;Operative site guarding Pain Intervention(s): Monitored during session;Repositioned;Ice applied;Premedicated before session    Home Living                      Prior Function            PT Goals (current goals can now be found in the care plan section) Progress towards PT goals: Progressing toward goals    Frequency    7X/week  PT Plan Current plan remains appropriate    Co-evaluation              AM-PAC PT "6 Clicks" Mobility   Outcome Measure  Help needed turning from your back to your side while in a flat bed without using bedrails?: A Little Help needed moving from lying on your back to sitting on the side of a flat bed without using bedrails?: A Little Help needed moving to and from a bed to a chair (including a wheelchair)?: A Little Help needed standing up from a chair using your arms (e.g., wheelchair or bedside chair)?: A Little Help needed to walk in hospital room?: A Little Help needed climbing 3-5 steps with a railing? : A Lot 6 Click Score: 17    End of Session Equipment Utilized During Treatment: Gait belt Activity Tolerance: Patient tolerated treatment well;Patient limited by pain;Treatment limited secondary to  medical complications (Comment) Patient left: in bed;with call bell/phone within reach;with bed alarm set   PT Visit Diagnosis: Other abnormalities of gait and mobility (R26.89);Difficulty in walking, not elsewhere classified (R26.2)     Time: 0940-1005 PT Time Calculation (min) (ACUTE ONLY): 25 min  Charges:  $Gait Training: 8-22 mins $Therapeutic Exercise: 8-22 mins                     Felecia Shelling  PTA Acute  Rehabilitation Services Pager      [phone] Office      [phone]

## 2018-11-16 ENCOUNTER — Other Ambulatory Visit: Payer: Self-pay | Admitting: Internal Medicine

## 2018-11-18 ENCOUNTER — Other Ambulatory Visit (HOSPITAL_COMMUNITY): Payer: Self-pay

## 2018-11-21 ENCOUNTER — Ambulatory Visit (INDEPENDENT_AMBULATORY_CARE_PROVIDER_SITE_OTHER): Payer: Medicare Other

## 2018-11-21 DIAGNOSIS — Z95810 Presence of automatic (implantable) cardiac defibrillator: Secondary | ICD-10-CM

## 2018-11-21 DIAGNOSIS — I5022 Chronic systolic (congestive) heart failure: Secondary | ICD-10-CM

## 2018-11-22 NOTE — Progress Notes (Signed)
EPIC Encounter for ICM Monitoring  Patient Name: [name] [name] is a 58 y.o. male Date: 11/22/2018 Primary Care Physican: [name], MD Primary Cardiologist:[name] Electrophysiologist:[name] Bi-V Pacing:99.7% Last Weight:310lbs  Today's Weight:unknown   Heart failure questions reviewed and denied any fluid symptoms.  He is doing well after total hip surgery 11/04/2018.    Thoracic impedance normal.   Prescribed:Torsemide20 mg 3 tablets (60 mg total) twice a day. Potassium 49mq 2 tablets (40 mEq total) twice a day.  Labs: 11/05/2018 Creatinine 1.05, BUN 15, Potassium 4.3, Sodium 136, GFR >60 10/29/2018 Creatinine 0.94, BUN 16, Potassium 3.7, Sodium 138, GFR >60  10/02/2018 Creatinine 0.97, BUN 15, Potassium 4.4, Sodium 140, GFR 88-1010  09/25/2017 Creatinine1.23, BUN22, Potassium4.2, [id number] EGFR>60 08/13/2018 Creatinine1.19, BUN16, Potassium4.4, Sodium136, EGFR>60 A complete set of results can be found in Results Review.  Recommendations: No changes and encouraged to call for fluid symptoms.    Follow-up plan: ICM clinic phone appointment on4/14/2020.  [place]  3 month ICM trend: 11/21/2018    1 Year ICM trend:       [name] RN 11/22/2018 12:55 PM

## 2018-11-29 ENCOUNTER — Other Ambulatory Visit (HOSPITAL_COMMUNITY): Payer: Self-pay | Admitting: Cardiology

## 2018-12-05 ENCOUNTER — Telehealth (HOSPITAL_COMMUNITY): Payer: Self-pay | Admitting: Licensed Clinical Social Worker

## 2018-12-05 ENCOUNTER — Other Ambulatory Visit (HOSPITAL_COMMUNITY): Payer: Self-pay | Admitting: Cardiology

## 2018-12-05 MED ORDER — ATORVASTATIN CALCIUM 80 MG PO TABS: ORAL_TABLET | ORAL | 3 refills | Status: DC

## 2018-12-05 NOTE — Telephone Encounter (Signed)
CSW received call from pt requesting medication assistance.  Has several medications prescribed through [facility] that he states pharmacy is unable to fill.  CSW called pharmacy and clarified- was able to have them fill pt spirolactone and potassium and had RN sent in new script for lipitor- CSW called pt and updated  CSW will continue to follow and assist as needed  [name], LCSW Clinical Social Worker [facility] [phone]

## 2018-12-05 NOTE — Telephone Encounter (Signed)
°*  STAT* If patient is at the pharmacy, call can be transferred to refill team.   1. Which medications need to be refilled? (please list name of each medication and dose if known)  Atorvastatin 80 MG 1 tablet daily  Potassium chloride 20 MEQ 2 tablets twice daily  Sotalol 80 MG 1 tablet every 12 hours Spironolactone 25 MG 1 tablet daily    2. Which pharmacy/location (including street and city if local pharmacy) is medication to be sent to? [facility] on [address]  3. Do they need a 30 day or 90 day supply? 90 day

## 2018-12-30 ENCOUNTER — Other Ambulatory Visit: Payer: Self-pay

## 2018-12-30 ENCOUNTER — Ambulatory Visit (INDEPENDENT_AMBULATORY_CARE_PROVIDER_SITE_OTHER): Payer: Medicare Other | Admitting: *Deleted

## 2018-12-30 DIAGNOSIS — I472 Ventricular tachycardia, unspecified: Secondary | ICD-10-CM

## 2018-12-30 DIAGNOSIS — I5022 Chronic systolic (congestive) heart failure: Secondary | ICD-10-CM

## 2018-12-30 LAB — CUP PACEART REMOTE DEVICE CHECK
Battery Remaining Longevity: 25 mo
Battery Voltage: 2.95 V
Brady Statistic AP VP Percent: 35.81 %
Brady Statistic AP VS Percent: 0.1 %
Brady Statistic AS VP Percent: 63.02 %
Brady Statistic AS VS Percent: 1.07 %
Brady Statistic RA Percent Paced: 35.54 %
Brady Statistic RV Percent Paced: 97.94 %
Date Time Interrogation Session: 20200413042202
HighPow Impedance: 76 Ohm
Implantable Lead Implant Date: 20160727
Implantable Lead Implant Date: 20160727
Implantable Lead Implant Date: 20160727
Implantable Lead Location: 753858
Implantable Lead Location: 753859
Implantable Lead Location: 753860
Implantable Lead Model: 4598
Implantable Lead Model: 5076
Implantable Lead Model: 6935
Implantable Pulse Generator Implant Date: 20160727
Lead Channel Impedance Value: 342 Ohm
Lead Channel Impedance Value: 342 Ohm
Lead Channel Impedance Value: 361 Ohm
Lead Channel Impedance Value: 418 Ohm
Lead Channel Impedance Value: 456 Ohm
Lead Channel Impedance Value: 475 Ohm
Lead Channel Impedance Value: 513 Ohm
Lead Channel Impedance Value: 532 Ohm
Lead Channel Impedance Value: 589 Ohm
Lead Channel Impedance Value: 589 Ohm
Lead Channel Impedance Value: 608 Ohm
Lead Channel Impedance Value: 646 Ohm
Lead Channel Impedance Value: 646 Ohm
Lead Channel Pacing Threshold Amplitude: 0.375 V
Lead Channel Pacing Threshold Amplitude: 0.625 V
Lead Channel Pacing Threshold Amplitude: 1.375 V
Lead Channel Pacing Threshold Pulse Width: 0.4 ms
Lead Channel Pacing Threshold Pulse Width: 0.4 ms
Lead Channel Pacing Threshold Pulse Width: 0.6 ms
Lead Channel Sensing Intrinsic Amplitude: 12.625 mV
Lead Channel Sensing Intrinsic Amplitude: 12.625 mV
Lead Channel Sensing Intrinsic Amplitude: 2.25 mV
Lead Channel Sensing Intrinsic Amplitude: 2.25 mV
Lead Channel Setting Pacing Amplitude: 1.5 V
Lead Channel Setting Pacing Amplitude: 2 V
Lead Channel Setting Pacing Amplitude: 2.5 V
Lead Channel Setting Pacing Pulse Width: 0.4 ms
Lead Channel Setting Pacing Pulse Width: 0.6 ms
Lead Channel Setting Sensing Sensitivity: 0.3 mV

## 2018-12-31 ENCOUNTER — Other Ambulatory Visit: Payer: Self-pay

## 2018-12-31 ENCOUNTER — Ambulatory Visit (INDEPENDENT_AMBULATORY_CARE_PROVIDER_SITE_OTHER): Payer: Medicare Other

## 2018-12-31 DIAGNOSIS — I5022 Chronic systolic (congestive) heart failure: Secondary | ICD-10-CM

## 2018-12-31 DIAGNOSIS — Z95810 Presence of automatic (implantable) cardiac defibrillator: Secondary | ICD-10-CM

## 2019-01-03 ENCOUNTER — Telehealth: Payer: Self-pay

## 2019-01-03 NOTE — Telephone Encounter (Signed)
Remote ICM transmission received.  Attempted call to patient regarding ICM remote transmission and left detailed message, per DPR, with next ICM remote transmission date of 02/03/2019.  Advised to return call for any fluid symptoms or questions.    

## 2019-01-03 NOTE — Progress Notes (Signed)
EPIC Encounter for ICM Monitoring  Patient Name: Noelle Hoogland is a 58 y.o. male Date: 01/03/2019 Primary Care Physican: [name], MD Primary Cardiologist:[name] Electrophysiologist:[name] Bi-V Pacing:99.9% Last Weight:310lbs  01/03/2019 Weight:unknown   Attempted call to patient and unable to reach.  Left detailed message per DPR regarding transmission. Transmission reviewed.   Thoracic impedance normal.   Prescribed:Torsemide20 mg 3 tablets (60 mg total) twice a day. Potassium 62mq 2 tablets (40 mEq total) twice a day.  Labs: 11/05/2018 Creatinine1.05, BUN15, Potassium4.3, Sodium136, GFR>60 10/29/2018 Creatinine0.94, BUN16, Potassium3.7, Sodium138, GFR>60  10/02/2018 Creatinine0.97, BUN15, Potassium4.4, Sodium140, [id number]/04/2018 Creatinine1.23, BUN22, Potassium4.2, [id number] EGFR>60 08/13/2018 Creatinine1.19, BUN16, Potassium4.4, Sodium136, EGFR>60 A complete set of results can be found in Results Review.  Recommendations: Left voice mail with ICM number and encouraged to call if experiencing any fluid symptoms.  Follow-up plan: ICM clinic phone appointment on5/18/2020.  [place] of [place]CM [place] sent to [place]  3 month ICM trend: 12/30/2018    1 Year ICM trend:       [name] RN 01/03/2019 10:13 AM

## 2019-01-07 ENCOUNTER — Other Ambulatory Visit (HOSPITAL_COMMUNITY): Payer: Self-pay | Admitting: Cardiology

## 2019-01-07 NOTE — Progress Notes (Signed)
Remote ICD transmission.   

## 2019-01-09 ENCOUNTER — Telehealth: Payer: Self-pay | Admitting: *Deleted

## 2019-01-09 NOTE — Telephone Encounter (Signed)
LMOVM requesting call back to DC to setup appointment for ICD reprogramming per Dr. Graciela Husbands.  Can offer afternoon appointment on Monday, 4/27 or 5/4 when pt returns call.

## 2019-01-09 NOTE — Telephone Encounter (Signed)
-----   Message from [name], MD sent at 01/08/2019  9:45 PM EDT ----- Remote reviewed. This remote is abnormal for VT detected just below the detection rate Can we bring him in and decrease the VT detection rate to 178

## 2019-01-13 ENCOUNTER — Other Ambulatory Visit: Payer: Self-pay

## 2019-01-13 ENCOUNTER — Ambulatory Visit (INDEPENDENT_AMBULATORY_CARE_PROVIDER_SITE_OTHER): Payer: Medicare Other | Admitting: Student

## 2019-01-13 DIAGNOSIS — I5022 Chronic systolic (congestive) heart failure: Secondary | ICD-10-CM | POA: Diagnosis not present

## 2019-01-13 DIAGNOSIS — Z95810 Presence of automatic (implantable) cardiac defibrillator: Secondary | ICD-10-CM

## 2019-01-13 DIAGNOSIS — I472 Ventricular tachycardia, unspecified: Secondary | ICD-10-CM

## 2019-01-13 LAB — CUP PACEART INCLINIC DEVICE CHECK
Battery Remaining Longevity: 26 mo
Battery Voltage: 2.94 V
Brady Statistic AP VP Percent: 39.38 %
Brady Statistic AP VS Percent: 0.13 %
Brady Statistic AS VP Percent: 59.31 %
Brady Statistic AS VS Percent: 1.18 %
Brady Statistic RA Percent Paced: 38.99 %
Brady Statistic RV Percent Paced: 97.49 %
Date Time Interrogation Session: 20200427160541
HighPow Impedance: 76 Ohm
Implantable Lead Implant Date: 20160727
Implantable Lead Implant Date: 20160727
Implantable Lead Implant Date: 20160727
Implantable Lead Location: 753858
Implantable Lead Location: 753859
Implantable Lead Location: 753860
Implantable Lead Model: 4598
Implantable Lead Model: 5076
Implantable Lead Model: 6935
Implantable Pulse Generator Implant Date: 20160727
Lead Channel Impedance Value: 361 Ohm
Lead Channel Impedance Value: 361 Ohm
Lead Channel Impedance Value: 361 Ohm
Lead Channel Impedance Value: 418 Ohm
Lead Channel Impedance Value: 418 Ohm
Lead Channel Impedance Value: 456 Ohm
Lead Channel Impedance Value: 475 Ohm
Lead Channel Impedance Value: 532 Ohm
Lead Channel Impedance Value: 589 Ohm
Lead Channel Impedance Value: 589 Ohm
Lead Channel Impedance Value: 608 Ohm
Lead Channel Impedance Value: 646 Ohm
Lead Channel Impedance Value: 665 Ohm
Lead Channel Pacing Threshold Amplitude: 0.5 V
Lead Channel Pacing Threshold Amplitude: 0.5 V
Lead Channel Pacing Threshold Amplitude: 1.25 V
Lead Channel Pacing Threshold Pulse Width: 0.4 ms
Lead Channel Pacing Threshold Pulse Width: 0.4 ms
Lead Channel Pacing Threshold Pulse Width: 0.6 ms
Lead Channel Sensing Intrinsic Amplitude: 14.125 mV
Lead Channel Sensing Intrinsic Amplitude: 14.25 mV
Lead Channel Sensing Intrinsic Amplitude: 2.375 mV
Lead Channel Sensing Intrinsic Amplitude: 2.625 mV
Lead Channel Setting Pacing Amplitude: 1.5 V
Lead Channel Setting Pacing Amplitude: 2 V
Lead Channel Setting Pacing Amplitude: 2.25 V
Lead Channel Setting Pacing Pulse Width: 0.4 ms
Lead Channel Setting Pacing Pulse Width: 0.6 ms
Lead Channel Setting Sensing Sensitivity: 0.3 mV

## 2019-01-13 NOTE — Progress Notes (Signed)
CRT-D device check in office due to VT hovering below detection, detected on remote transmission. Thresholds and sensing consistent with previous device measurements. Lead impedance trends stable over time. No mode switch episodes recorded. 3 monitored ventricular events - 1 true NSVT, 2 events from 4/8 of VT hovering at detection. VT detection zone changed from 200 bpm to 176 bpm per Dr Graciela Husbands. Patient bi-ventricularly pacing 99.4% of the time. Device programmed with appropriate safety margins. Heart failure diagnostics reviewed and trends are stable for patient. Audible alerts demonstrated for patient.  Estimated longevity 2 yrs and 2 month.  Patient enrolled in remote follow up. Plan to check device remotely in 1 month.  [name] [name] [address] [place], PA-C 01/13/2019 3:52 PM

## 2019-02-03 ENCOUNTER — Other Ambulatory Visit: Payer: Self-pay

## 2019-02-03 ENCOUNTER — Ambulatory Visit (INDEPENDENT_AMBULATORY_CARE_PROVIDER_SITE_OTHER): Payer: Medicare Other

## 2019-02-03 DIAGNOSIS — I5022 Chronic systolic (congestive) heart failure: Secondary | ICD-10-CM | POA: Diagnosis not present

## 2019-02-03 DIAGNOSIS — Z95810 Presence of automatic (implantable) cardiac defibrillator: Secondary | ICD-10-CM

## 2019-02-04 NOTE — Progress Notes (Signed)
EPIC Encounter for ICM Monitoring  Patient Name: [name] [name] is a 58 y.o. male Date: 02/04/2019 Primary Care Physican: [name], MD Primary Cardiologist:[name] Electrophysiologist:[name] Bi-V Pacing:99.9% Last Weight:310lbs    Transmission reviewed and results sent via my chart.  Thoracic impedance normal.    Prescribed:Torsemide20 mg 3 tablets (60 mg total) twice a day. Potassium 29mq 2 tablets (40 mEq total) twice a day.  Labs: 11/05/2018 Creatinine1.05, BUN15, Potassium4.3, Sodium136, GFR>60 10/29/2018 Creatinine0.94, BUN16, Potassium3.7, Sodium138, GFR>60  10/02/2018 Creatinine0.97, BUN15, Potassium4.4, Sodium140, [id number]/04/2018 Creatinine1.23, BUN22, Potassium4.2, [id number] EGFR>60 08/13/2018 Creatinine1.19, BUN16, Potassium4.4, Sodium136, EGFR>60 A complete set of results can be found in Results Review.  Recommendations:None.  Follow-up plan: ICM clinic phone appointment on6/22/2020  3 month ICM trend: 02/03/2019    1 Year ICM trend:       [name] RN 02/04/2019 4:13 PM

## 2019-02-10 ENCOUNTER — Other Ambulatory Visit (HOSPITAL_COMMUNITY): Payer: Self-pay | Admitting: Cardiology

## 2019-02-12 NOTE — Telephone Encounter (Signed)
Patient hasnt been seen since 09/2017. Please advise on refill

## 2019-03-10 ENCOUNTER — Ambulatory Visit (INDEPENDENT_AMBULATORY_CARE_PROVIDER_SITE_OTHER): Payer: Medicare Other

## 2019-03-10 DIAGNOSIS — I5022 Chronic systolic (congestive) heart failure: Secondary | ICD-10-CM | POA: Diagnosis not present

## 2019-03-10 DIAGNOSIS — Z95810 Presence of automatic (implantable) cardiac defibrillator: Secondary | ICD-10-CM | POA: Diagnosis not present

## 2019-03-12 NOTE — Progress Notes (Signed)
EPIC Encounter for ICM Monitoring  Patient Name: [name] [name] is a 58 y.o. male Date: 03/12/2019 Primary Care Physican: [name], MD Primary Cardiologist:[name] Electrophysiologist:[name] Bi-V Pacing:97.5% 03/12/2019 Weight: 309 lbs   Spoke with patient and he doing well.    Optivol Thoracic impedance normal.    Prescribed:Torsemide20 mg 3 tablets (60 mg total) twice a day. Potassium 59mq 2 tablets (40 mEq total) twice a day.  Labs: 11/05/2018 Creatinine1.05, BUN15, Potassium4.3, Sodium136, GFR>60 10/29/2018 Creatinine0.94, BUN16, Potassium3.7, Sodium138, GFR>60  10/02/2018 Creatinine0.97, BUN15, Potassium4.4, Sodium140, [id number]/04/2018 Creatinine1.23, BUN22, Potassium4.2, [id number] EGFR>60 08/13/2018 Creatinine1.19, BUN16, Potassium4.4, Sodium136, EGFR>60 A complete set of results can be found in Results Review.  Recommendations: Encouraged to call if experiencing any fluid symptoms.   Follow-up plan: ICM clinic phone appointment on7/27/2020.    Copy of ICM check sent to Dr. [name]   3 month ICM trend: 03/10/2019    1 Year ICM trend:       [name] RN 03/12/2019 10:23 AM

## 2019-03-31 ENCOUNTER — Ambulatory Visit (INDEPENDENT_AMBULATORY_CARE_PROVIDER_SITE_OTHER): Payer: Medicare Other | Admitting: *Deleted

## 2019-03-31 DIAGNOSIS — I5022 Chronic systolic (congestive) heart failure: Secondary | ICD-10-CM

## 2019-03-31 DIAGNOSIS — I472 Ventricular tachycardia, unspecified: Secondary | ICD-10-CM

## 2019-03-31 LAB — CUP PACEART REMOTE DEVICE CHECK
Battery Remaining Longevity: 22 mo
Battery Voltage: 2.94 V
Brady Statistic AP VP Percent: 43.04 %
Brady Statistic AP VS Percent: 0.11 %
Brady Statistic AS VP Percent: 55.9 %
Brady Statistic AS VS Percent: 0.94 %
Brady Statistic RA Percent Paced: 42.42 %
Brady Statistic RV Percent Paced: 97.37 %
Date Time Interrogation Session: 20200713041604
HighPow Impedance: 78 Ohm
Implantable Lead Implant Date: 20160727
Implantable Lead Implant Date: 20160727
Implantable Lead Implant Date: 20160727
Implantable Lead Location: 753858
Implantable Lead Location: 753859
Implantable Lead Location: 753860
Implantable Lead Model: 4598
Implantable Lead Model: 5076
Implantable Lead Model: 6935
Implantable Pulse Generator Implant Date: 20160727
Lead Channel Impedance Value: 342 Ohm
Lead Channel Impedance Value: 361 Ohm
Lead Channel Impedance Value: 361 Ohm
Lead Channel Impedance Value: 418 Ohm
Lead Channel Impedance Value: 418 Ohm
Lead Channel Impedance Value: 456 Ohm
Lead Channel Impedance Value: 475 Ohm
Lead Channel Impedance Value: 532 Ohm
Lead Channel Impedance Value: 551 Ohm
Lead Channel Impedance Value: 589 Ohm
Lead Channel Impedance Value: 589 Ohm
Lead Channel Impedance Value: 589 Ohm
Lead Channel Impedance Value: 646 Ohm
Lead Channel Pacing Threshold Amplitude: 0.5 V
Lead Channel Pacing Threshold Amplitude: 0.5 V
Lead Channel Pacing Threshold Amplitude: 1.25 V
Lead Channel Pacing Threshold Pulse Width: 0.4 ms
Lead Channel Pacing Threshold Pulse Width: 0.4 ms
Lead Channel Pacing Threshold Pulse Width: 0.6 ms
Lead Channel Sensing Intrinsic Amplitude: 1.75 mV
Lead Channel Sensing Intrinsic Amplitude: 1.75 mV
Lead Channel Sensing Intrinsic Amplitude: 12.625 mV
Lead Channel Sensing Intrinsic Amplitude: 12.625 mV
Lead Channel Setting Pacing Amplitude: 1.5 V
Lead Channel Setting Pacing Amplitude: 2 V
Lead Channel Setting Pacing Amplitude: 2.25 V
Lead Channel Setting Pacing Pulse Width: 0.4 ms
Lead Channel Setting Pacing Pulse Width: 0.6 ms
Lead Channel Setting Sensing Sensitivity: 0.3 mV

## 2019-04-02 ENCOUNTER — Other Ambulatory Visit (HOSPITAL_COMMUNITY): Payer: Self-pay | Admitting: Cardiology

## 2019-04-03 ENCOUNTER — Telehealth: Payer: Self-pay | Admitting: Cardiology

## 2019-04-03 NOTE — Telephone Encounter (Signed)
lm 04-03-19 for recall °

## 2019-04-04 ENCOUNTER — Telehealth: Payer: Self-pay | Admitting: Internal Medicine

## 2019-04-04 NOTE — Telephone Encounter (Signed)
° ° °*  STAT* If patient is at the pharmacy, call can be transferred to refill team.   1. Which medications need to be refilled? (please list name of each medication and dose if known)   2. Which pharmacy/location (including street and city if local pharmacy) is medication to be sent to?  [place], [address]  3. Do they need a 30 day or 90 day supply?  30  Patient states he tried to get a refill, but the pharmacy told him he needed to contact his doctor. He had on the bottle that Dr. Percival Spanish wrote this prescription, but the patient has not been seen by Dr. Percival Spanish since 2017.  He wanted to know if his current cardiologist would write an rx for lasix

## 2019-04-07 ENCOUNTER — Other Ambulatory Visit (HOSPITAL_COMMUNITY): Payer: Self-pay

## 2019-04-07 ENCOUNTER — Other Ambulatory Visit (HOSPITAL_COMMUNITY): Payer: Self-pay | Admitting: *Deleted

## 2019-04-07 MED ORDER — TORSEMIDE 20 MG PO TABS: 60.0000 mg | ORAL_TABLET | Freq: Two times a day (BID) | ORAL | 0 refills | Status: DC

## 2019-04-07 NOTE — Telephone Encounter (Signed)
This is a CHF pt 

## 2019-04-09 NOTE — Progress Notes (Signed)
Remote ICD transmission.   

## 2019-04-14 ENCOUNTER — Ambulatory Visit (INDEPENDENT_AMBULATORY_CARE_PROVIDER_SITE_OTHER): Payer: Medicare Other

## 2019-04-14 DIAGNOSIS — Z95810 Presence of automatic (implantable) cardiac defibrillator: Secondary | ICD-10-CM

## 2019-04-14 DIAGNOSIS — I5022 Chronic systolic (congestive) heart failure: Secondary | ICD-10-CM | POA: Diagnosis not present

## 2019-04-15 NOTE — Progress Notes (Signed)
EPIC Encounter for ICM Monitoring  Patient Name: [name] [name] is a 58 y.o. male Date: 04/15/2019 Primary Care Physican: [name], MD Primary Cardiologist:[name] Electrophysiologist:[name] Bi-V Pacing:96.3% 03/12/2019 Weight: 309 lbs   Transmission reviewed and results sent via mychart.   Optivol Thoracic impedance normal.   Prescribed:Torsemide20 mg 3 tablets (60 mg total) twice a day. Potassium 72mq 2 tablets (40 mEq total) twice a day.  Labs: 11/05/2018 Creatinine1.05, BUN15, Potassium4.3, Sodium136, GFR>60 10/29/2018 Creatinine0.94, BUN16, Potassium3.7, Sodium138, GFR>60  10/02/2018 Creatinine0.97, BUN15, Potassium4.4, Sodium140, [id number]/04/2018 Creatinine1.23, BUN22, Potassium4.2, [id number] EGFR>60 08/13/2018 Creatinine1.19, BUN16, Potassium4.4, Sodium136, EGFR>60 A complete set of results can be found in Results Review.  Recommendations: Encouraged to call if experiencing any fluid symptoms.   Follow-up plan: ICM clinic phone appointment on8/31/2020.    Copy of ICM check sent to Dr. [name]    3 month ICM trend: 04/14/2019    1 Year ICM trend:       [name] RN 04/15/2019 12:48 PM

## 2019-04-22 ENCOUNTER — Other Ambulatory Visit (HOSPITAL_COMMUNITY): Payer: Self-pay

## 2019-04-22 MED ORDER — ATORVASTATIN CALCIUM 80 MG PO TABS: ORAL_TABLET | ORAL | 3 refills | Status: DC

## 2019-05-19 ENCOUNTER — Ambulatory Visit (INDEPENDENT_AMBULATORY_CARE_PROVIDER_SITE_OTHER): Payer: Medicare Other

## 2019-05-19 DIAGNOSIS — Z95810 Presence of automatic (implantable) cardiac defibrillator: Secondary | ICD-10-CM | POA: Diagnosis not present

## 2019-05-19 DIAGNOSIS — I5022 Chronic systolic (congestive) heart failure: Secondary | ICD-10-CM | POA: Diagnosis not present

## 2019-05-21 ENCOUNTER — Other Ambulatory Visit (HOSPITAL_COMMUNITY): Payer: Self-pay | Admitting: Cardiology

## 2019-05-21 NOTE — Progress Notes (Signed)
EPIC Encounter for ICM Monitoring  Patient Name: [name] [name] is a 58 y.o. male Date: 05/21/2019 Primary Care Physican: [name], MD Primary Cardiologist:[name] Electrophysiologist:[name] Bi-V Pacing:94.2% 03/12/2019 Weight: 309 lbs  Clinical Status (14-Apr-2019 to 21-May-2019) AT/AF                20 episodes Time in AT/AF  0.8 hr/day (3.2%)  Longest AT/AF  13 hours  Observations (2) (14-Apr-2019 to 21-May-2019) AT/AF >= 6 hr for 1 days Avg. Ventricular Rate >= 100 bpm during AT/AF (>= 6 hr) for 1 days.  Event Summary:  ?AT/AF Daily Burden > Threshold ?Fast V Rate During AT/AF ?7619 V. Sensing Episodes ?6 SVT ?29 hours in AT/AF Since Last Session    Attempted call to patient and unable to reach.  Left detailed message per DPR regarding transmission. Transmission reviewed.   OptivolThoracic impedance normal.   Message sent to device clinic triage to review 9/2 report regarding AT/AF.   Prescribed:Torsemide20 mg 3 tablets (60 mg total) twice a day. Potassium 73mq 2 tablets (40 mEq total) twice a day.  Labs: 11/05/2018 Creatinine1.05, BUN15, Potassium4.3, Sodium136, GFR>60 10/29/2018 Creatinine0.94, BUN16, Potassium3.7, Sodium138, GFR>60  10/02/2018 Creatinine0.97, BUN15, Potassium4.4, Sodium140, [id number]/04/2018 Creatinine1.23, BUN22, Potassium4.2, [id number] EGFR>60 08/13/2018 Creatinine1.19, BUN16, Potassium4.4, Sodium136, EGFR>60 A complete set of results can be found in Results Review.  Recommendations: Left voice mail with ICM number and encouraged to call if experiencing any fluid symptoms.  Follow-up plan: ICM clinic phone appointment on 07/02/2019.   91 day device clinic remote transmission 07/01/2019.      Copy of ICM check sent to Dr. [name]   3 month ICM trend: 05/21/2019    1 Year ICM trend:       [name] RN 05/21/2019 1:30 PM

## 2019-05-27 ENCOUNTER — Telehealth: Payer: Self-pay | Admitting: *Deleted

## 2019-05-27 NOTE — Telephone Encounter (Signed)
LMOVM requesting call back to DC. Gave direct number.  ICM transmission from 05/21/19 reveals increased AT/AF burden (3.2%), EGMs suggest AF/A-flutter, most episodes occurred on 05/14/19. 6 SVT episodes also noted on that date, EGMs show 1:1 tachycardia. Per review of med list, appears Xarelto was d/c after hip arthroplasty in 10/2018. Pt is overdue for f/u with Dr. Caryl Comes as of 03/2019. Will confirm compliance with amiodarone and sotalol and discuss any symptoms with AF episodes.

## 2019-05-28 NOTE — Telephone Encounter (Signed)
Spoke with patient. He reports compliance with amiodarone and sotalol. No missed doses. Took last dose of torsemide this morning, has not been able to get a refill. Refill request noted from 03/2019 in chart in [place]. He also has not been taking Entresto or Xarelto as he was getting them in the mail (he believes through patient assistance), but then stopped receiving them and was not able to afford these medications. Pt is overdue for f/u with Dr. Aundra Dubin per recall in [place]. Will route message to HF Clinic staff to f/u. Pt aware, agrees to call HF Clinic directly if he does not hear back about torsemide refills and MD f/u.  Advised pt that message will also be sent to Dr. Caryl Comes for recommendations regarding the AF/A-flutter episodes. Pt verbalizes understanding.   Called pt back to advise that new prescription for torsemide was sent on 05/22/19 per our records. Encouraged him to contact pharmacy, then call HF Clinic if he is still not able to get it filled. Pt confirms he has HF Clinic number. No further questions at this time.

## 2019-05-28 NOTE — Telephone Encounter (Signed)
Patient returning your call.

## 2019-05-28 NOTE — Telephone Encounter (Signed)
Patient is scheduled for overdue f/u with EP APP Kristin Bruins, PA) on 05/30/19.

## 2019-05-28 NOTE — Telephone Encounter (Signed)
Message sent to schedulers to arrange f/u appt w/HF clinic, message sent to [facility], pharm tech to f/u with pt regarding pt assistance options

## 2019-05-29 ENCOUNTER — Telehealth (HOSPITAL_COMMUNITY): Payer: Self-pay | Admitting: Pharmacy Technician

## 2019-05-29 NOTE — Telephone Encounter (Signed)
Received a message from the clinic regarding patient's ability to get his Entresto and Xarelto. Our clinic was notified by another office that the patient has not been taking his medication due to cost.  Called patient and he was not sure why they stopped sending his [name] and stopped paying for his Xarelto at the pharmacy.  [employer]. Representative stated that the patients enrollment ended June 2019. He would need to fill out a new application with proof of income, copy of front and back of new part d card and an expense report from his pharmacy for the year.  Called [employer] about Xarelto, the patient was denied because he had not met the OOP expense in September 2019. Patient would need to fill out a new application to see if he would qualify.  Ran test claims on both Entresto and last dosing of Xarelto to see what the co-pays would be. Both prescriptions are $40 a piece, totaling at $80 monthly.  Spoke with patient about all my findings and he states that $80 a month would be a lot for him. Told him all the documents needed for [place] to proceed with the application for both foundations. He agreed to come sign the applications tomorrow as he has an appointment.  I am looking to see if he should be restarting Xarelto as it was previously DC'd. We will still start the application for Xarelto so we can go ahead and get his signature in case it will be restarted  Will take both applications to front check in desk for patient to sign.  Will follow up.  Charlann Boxer, CPhT

## 2019-05-30 ENCOUNTER — Ambulatory Visit (INDEPENDENT_AMBULATORY_CARE_PROVIDER_SITE_OTHER): Payer: Medicare Other | Admitting: Student

## 2019-05-30 ENCOUNTER — Other Ambulatory Visit: Payer: Self-pay

## 2019-05-30 VITALS — BP 112/74 | HR 63 | Ht 76.0 in | Wt 313.0 lb

## 2019-05-30 DIAGNOSIS — I5022 Chronic systolic (congestive) heart failure: Secondary | ICD-10-CM

## 2019-05-30 LAB — CUP PACEART INCLINIC DEVICE CHECK
Battery Remaining Longevity: 23 mo
Battery Voltage: 2.93 V
Brady Statistic AP VP Percent: 44.97 %
Brady Statistic AP VS Percent: 0.14 %
Brady Statistic AS VP Percent: 53.32 %
Brady Statistic AS VS Percent: 1.58 %
Brady Statistic RA Percent Paced: 44.11 %
Brady Statistic RV Percent Paced: 96.33 %
Date Time Interrogation Session: 20200911145107
HighPow Impedance: 72 Ohm
Implantable Lead Implant Date: 20160727
Implantable Lead Implant Date: 20160727
Implantable Lead Implant Date: 20160727
Implantable Lead Location: 753858
Implantable Lead Location: 753859
Implantable Lead Location: 753860
Implantable Lead Model: 4598
Implantable Lead Model: 5076
Implantable Lead Model: 6935
Implantable Pulse Generator Implant Date: 20160727
Lead Channel Impedance Value: 342 Ohm
Lead Channel Impedance Value: 342 Ohm
Lead Channel Impedance Value: 361 Ohm
Lead Channel Impedance Value: 418 Ohm
Lead Channel Impedance Value: 456 Ohm
Lead Channel Impedance Value: 456 Ohm
Lead Channel Impedance Value: 475 Ohm
Lead Channel Impedance Value: 475 Ohm
Lead Channel Impedance Value: 551 Ohm
Lead Channel Impedance Value: 589 Ohm
Lead Channel Impedance Value: 589 Ohm
Lead Channel Impedance Value: 608 Ohm
Lead Channel Impedance Value: 646 Ohm
Lead Channel Pacing Threshold Amplitude: 0.5 V
Lead Channel Pacing Threshold Amplitude: 0.5 V
Lead Channel Pacing Threshold Amplitude: 1.25 V
Lead Channel Pacing Threshold Pulse Width: 0.4 ms
Lead Channel Pacing Threshold Pulse Width: 0.4 ms
Lead Channel Pacing Threshold Pulse Width: 0.6 ms
Lead Channel Sensing Intrinsic Amplitude: 12.625 mV
Lead Channel Sensing Intrinsic Amplitude: 15 mV
Lead Channel Sensing Intrinsic Amplitude: 2.125 mV
Lead Channel Sensing Intrinsic Amplitude: 5.125 mV
Lead Channel Setting Pacing Amplitude: 1.5 V
Lead Channel Setting Pacing Amplitude: 2 V
Lead Channel Setting Pacing Amplitude: 2.25 V
Lead Channel Setting Pacing Pulse Width: 0.4 ms
Lead Channel Setting Pacing Pulse Width: 0.6 ms
Lead Channel Setting Sensing Sensitivity: 0.3 mV

## 2019-05-30 NOTE — Progress Notes (Signed)
Electrophysiology Office Note Date: 05/30/2019  ID:  [name] [name], DOB 07/31/1961, MRN [id number]  PCP: [name], MD Primary Cardiologist: No primary care provider on file. Electrophysiologist: Virl Axe, MD  CC: Routine ICD follow-up  [name] [name] is a 58 y.o. male seen today for Dr. Caryl Comes.  They present today for routine electrophysiology followup.  Since last being seen in our clinic, the patient reports doing very well. He is out of several of his medications. He thinks he has been off Xarelto for nearly a year. He is also off Entresto.  HF pharmacy working on patient assistance. He denies chest pain, palpitations, dyspnea, PND, orthopnea, nausea, vomiting, dizziness, syncope, edema, or early satiety.  He has not had ICD shocks.   Device History: Medtronic BiV ICD implanted 2016 for CHF History of appropriate therapy: Yes History of AAD therapy: Yes   Past Medical History:  Diagnosis Date  . AICD (automatic cardioverter/defibrillator) present   . Arthritis   . CAD (coronary artery disease)    Mild nonobstructive (Cath 09)  . Chronic systolic heart failure (HCC)    Nonischemic CM: echo 4/12 with EF 25-30%, grade 2 diast dysfxn, mild dilated aortic root 43 mm, trivial MR, mod LAE  . COPD (chronic obstructive pulmonary disease) ([place])   . Dyspnea   . H/O ETOH abuse   . HTN (hypertension)   . Hypersomnia   . Obesity, morbid ([place])   . OSA on CPAP   . Systolic and diastolic CHF, acute on chronic ([place]) 11/2016  . Tobacco use disorder   . Ventricular tachycardia [facility])    Past Surgical History:  Procedure Laterality Date  . CARDIAC CATHETERIZATION N/A 04/08/2015   Procedure: Right/Left Heart Cath and Coronary Angiography;  Surgeon: Jettie Booze, MD;  Location: [place] CV LAB;  Service: Cardiovascular;  Laterality: N/A;  . EP IMPLANTABLE DEVICE N/A 04/14/2015   Procedure: BiV ICD Insertion CRT-D;  Surgeon: Deboraha Sprang, MD;  Location: [place] CV  LAB;  Service: Cardiovascular;  Laterality: N/A;  . HYDROCELE EXCISION / REPAIR    . TOTAL HIP ARTHROPLASTY Right 11/04/2018   Procedure: TOTAL HIP ARTHROPLASTY ANTERIOR APPROACH;  Surgeon: Dorna Leitz, MD;  Location: [facility] ORS;  Service: Orthopedics;  Laterality: Right;  . TUMOR EXCISION Left   . 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 VASECTOMY      Current Outpatient Medications  Medication Sig Dispense Refill  . acetaminophen (TYLENOL) 325 MG tablet Take 2 tablets (650 mg total) by mouth every 4 (four) hours as needed for headache or mild pain.    Marland Kitchen albuterol (PROVENTIL HFA;VENTOLIN HFA) 108 (90 Base) MCG/ACT inhaler Inhale 1 puff into the lungs every 6 (six) hours as needed for wheezing or shortness of breath. 1 Inhaler 1  . amiodarone (PACERONE) 200 MG tablet Take 1 tablet by mouth once daily 30 tablet 2  . aspirin EC 325 MG tablet Take 1 tablet (325 mg total) by mouth 2 (two) times daily after a meal. Take x 1 month post op to decrease risk of blood clots. 60 tablet 0  . atorvastatin (LIPITOR) 80 MG tablet TAKE 1 TABLET BY MOUTH ONCE DAILY AT 6PM 30 tablet 3  . docusate sodium (COLACE) 100 MG capsule Take 1 capsule (100 mg total) by mouth 2 (two) times daily. 30 capsule 0  . FLUoxetine (PROZAC) 20 MG tablet Take 20 mg by mouth daily.    . potassium chloride SA (  K-DUR) 20 MEQ tablet Take 2 tablets by mouth twice daily 120 tablet 0  . sacubitril-valsartan (ENTRESTO) 49-51 MG Take 1 tablet by mouth 2 (two) times daily. 60 tablet 5  . sotalol (BETAPACE) 80 MG tablet TAKE 1 TABLET BY MOUTH EVERY 12 HOURS 180 tablet 2  . spironolactone (ALDACTONE) 25 MG tablet Take 1 tablet (25 mg total) by mouth daily. 30 tablet 5  . tiZANidine (ZANAFLEX) 2 MG tablet Take 1 tablet (2 mg total) by mouth every 8 (eight) hours as needed for muscle spasms. 40 tablet 0  . torsemide (DEMADEX) 20 MG tablet TAKE 3 TABLETS BY MOUTH TWICE DAILY 180 tablet 0   No current facility-administered  medications for this visit.     Allergies:   Patient has no known allergies.   Social History: Social History   Socioeconomic History  . Marital status: Divorced    Spouse name: Not on file  . Number of children: 2  . Years of education: Not on file  . Highest education level: Not on file  Occupational History  . Occupation: Copy  . Occupation: disabled, odd jobs  Social Needs  . Financial resource strain: Not on file  . Food insecurity    Worry: Not on file    Inability: Not on file  . Transportation needs    Medical: Not on file    Non-medical: Not on file  Tobacco Use  . Smoking status: Former Smoker    Packs/day: 0.50    Years: 4.00    Pack years: 2.00    Types: Cigarettes    Quit date: 10/29/2005    Years since quitting: 13.5  . Smokeless tobacco: Never Used  . Tobacco comment: about a pack a week. social  Substance and Sexual Activity  . Alcohol use: Yes    Alcohol/week: 42.0 standard drinks    Types: 42 Cans of beer per week    Comment: 24 a week  . Drug use: Yes    Frequency: 7.0 times per week    Types: Marijuana    Comment: has discontinued  . Sexual activity: Yes    Partners: Female  Lifestyle  . Physical activity    Days per week: Not on file    Minutes per session: Not on file  . Stress: Not on file  Relationships  . Social Musician on phone: Not on file    Gets together: Not on file    Attends religious service: Not on file    Active member of club or organization: Not on file    Attends meetings of clubs or organizations: Not on file    Relationship status: Not on file  . Intimate partner violence    Fear of current or ex partner: Not on file    Emotionally abused: Not on file    Physically abused: Not on file    Forced sexual activity: Not on file  Other Topics Concern  . Not on file  Social History Narrative   ** Merged History Encounter **        Family History: Family History  Problem Relation Age of Onset   . Other Mother        cardiac surgery. late 1990s  . Heart Problems Mother        CABG AGE 57  . Congestive Heart Failure Father   . Healthy Father        AGE 84  . Coronary artery disease Other   .  Healthy Brother        AGE 20  . Healthy Sister        AGE 56  . Healthy Sister        AGE 56  . Healthy Son   . Healthy Son   . Healthy Daughter     Review of Systems: All other systems reviewed and are otherwise negative except as noted above.   Physical Exam: Vitals:   05/30/19 1258  BP: 112/74  Pulse: 63  Weight: (!) 313 lb (142 kg)  Height: 6\' 4"  (1.93 m)     GEN- The patient is well appearing, alert and oriented x 3 today.   HEENT: normocephalic, atraumatic; sclera clear, conjunctiva pink; hearing intact; oropharynx clear; neck supple, no JVP Lymph- no cervical lymphadenopathy Lungs- Clear to ausculation bilaterally, normal work of breathing.  No wheezes, rales, rhonchi Heart- Regular rate and rhythm, no murmurs, rubs or gallops, PMI not laterally displaced GI- soft, non-tender, non-distended, bowel sounds present, no hepatosplenomegaly Extremities- no clubbing, cyanosis, or edema; DP/PT/radial pulses 2+ bilaterally MS- no significant deformity or atrophy Skin- warm and dry, no rash or lesion; ICD pocket well healed Psych- euthymic mood, full affect Neuro- strength and sensation are intact  ICD interrogation- reviewed in detail today,  See PACEART report  EKG:  EKG is ordered today. The ekg ordered today shows AS -VP rhythm at 63 bpm. QTc 540 (stable), QRS 184 ms  Recent Labs: 10/02/2018: Magnesium 2.4 10/29/2018: ALT 26 11/05/2018: BUN 15; Creatinine, Ser 1.05; Hemoglobin 10.5; Platelets 145; Potassium 4.0; Sodium 136   Wt Readings from Last 3 Encounters:  05/30/19 (!) 313 lb (142 kg)  11/04/18 (!) 308 lb 10.3 oz (140 kg)  10/29/18 (!) 310 lb (140.6 kg)     Other studies Reviewed: Additional studies/ records that were reviewed today include: Office notes,  remote checks.  Assessment and Plan:  1.  Chronic systolic dysfunction Euvolemic today Stable despite med non-compliance. He is to see HF clinic Pharmacist today to work on getting back on [place] and Entresto.  Normal ICD function See Arita Missace Art report No changes today  2. VT Stable No change required today Continue amio/sotalol  QTc stable.   3. PAF Burden by device <1%. No further episodes since 8/26. He was asymptomatic.  Resume Xarelto ASAP. Working with Pharmacy in HF.   Current medicines are reviewed at length with the patient today.   The patient does not have concerns regarding his medicines.  The following changes were made today:  none  Labs/ tests ordered today include:  Orders Placed This Encounter  Procedures  . EKG 12-Lead   Disposition:   Follow up with Dr. Graciela HusbandsKlein in 6 months. Continue 3 month remotes.   Dustin FlockSigned, Vanessa Kampf Andrew Esparanza Krider, PA-C  05/30/2019 2:48 PM  [facility] [address] [place] [address] [phone] (office) [phone] (fax)

## 2019-05-30 NOTE — Patient Instructions (Signed)
Medication Instructions:  Your physician recommends that you continue on your current medications as directed. Please refer to the Current Medication list given to you today.  If you need a refill on your cardiac medications before your next appointment, please call your pharmacy.   Lab work: NONE ORDERED  TODAY  If you have labs (blood work) drawn today and your tests are completely normal, you will receive your results only by: . MyChart Message (if you have MyChart) OR . A paper copy in the mail If you have any lab test that is abnormal or we need to change your treatment, we will call you to review the results.  Testing/Procedures: NONE ORDERED  TODAY   Follow-Up: At [facility], you and your health needs are our priority.  As part of our continuing mission to provide you with exceptional heart care, we have created designated Provider Care Teams.  These Care Teams include your primary Cardiologist (physician) and Advanced Practice Providers (APPs -  Physician Assistants and Nurse Practitioners) who all work together to provide you with the care you need, when you need it. You will need a follow up appointment in 6 months.  Please call our office 2 months in advance to schedule this appointment.  You may see Steven [name], MD or one of the following Advanced Practice Providers on your designated Care Team:   Amber Seiler, NP . Renee Ursuy, PA-C . Andrew Tillery PA-C  Any Other Special Instructions Will Be Listed Below (If Applicable).    

## 2019-06-02 NOTE — Telephone Encounter (Signed)
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

## 2019-06-02 NOTE — Telephone Encounter (Signed)
Got clarification on Xarelto. Patient is no longer taking this medicaiton.  Patient came in and signed application for [employer] and brought in all the documents needed.   Sent in via fax  [phone]  Will continue to follow  Charlann Boxer, CPhT

## 2019-06-03 NOTE — Telephone Encounter (Signed)
Thank you!  He is supposed to see the HF clinic as well, so I will follow up on that, and if their visits are further out, I can see him back myself for further.    Enjoy your trip!  Jonni Sanger

## 2019-06-04 NOTE — Telephone Encounter (Signed)
Hey Amy!  You see this gentleman on 9/25.  We are in the process of ordering a repeat Echo.    Would you guys please check amiodarone labs when you see him?   Thank y'all,   Jonni Sanger

## 2019-06-04 NOTE — Telephone Encounter (Signed)
Patient was approved to receive Entresto through [employer].  ID: [id number]  Effective Dates: 06/02/2020 through 09/18/2019  Called to inform him of approval, left voicemail with phone number to [employer]. Advised him to call and set up shipment. Told him to call me with any issues.  [employer]: [phone]  [place]

## 2019-06-13 ENCOUNTER — Ambulatory Visit (HOSPITAL_COMMUNITY)
Admission: RE | Admit: 2019-06-13 | Discharge: 2019-06-13 | Disposition: A | Discharge status: Home / Self Care | Payer: Medicare Other | Source: Ambulatory Visit | Attending: Adult Health | Admitting: Adult Health

## 2019-06-13 ENCOUNTER — Other Ambulatory Visit: Payer: Self-pay

## 2019-06-13 ENCOUNTER — Encounter (HOSPITAL_COMMUNITY): Payer: Self-pay

## 2019-06-13 VITALS — BP 98/74 | HR 87 | Wt 309.0 lb

## 2019-06-13 DIAGNOSIS — F1010 Alcohol abuse, uncomplicated: Secondary | ICD-10-CM | POA: Diagnosis not present

## 2019-06-13 DIAGNOSIS — Z87891 Personal history of nicotine dependence: Secondary | ICD-10-CM | POA: Diagnosis not present

## 2019-06-13 DIAGNOSIS — I5022 Chronic systolic (congestive) heart failure: Secondary | ICD-10-CM | POA: Diagnosis not present

## 2019-06-13 DIAGNOSIS — I480 Paroxysmal atrial fibrillation: Secondary | ICD-10-CM

## 2019-06-13 DIAGNOSIS — I472 Ventricular tachycardia, unspecified: Secondary | ICD-10-CM

## 2019-06-13 DIAGNOSIS — G4733 Obstructive sleep apnea (adult) (pediatric): Secondary | ICD-10-CM | POA: Insufficient documentation

## 2019-06-13 DIAGNOSIS — Z8249 Family history of ischemic heart disease and other diseases of the circulatory system: Secondary | ICD-10-CM | POA: Insufficient documentation

## 2019-06-13 DIAGNOSIS — Z7982 Long term (current) use of aspirin: Secondary | ICD-10-CM | POA: Diagnosis not present

## 2019-06-13 DIAGNOSIS — I428 Other cardiomyopathies: Secondary | ICD-10-CM | POA: Diagnosis not present

## 2019-06-13 DIAGNOSIS — J449 Chronic obstructive pulmonary disease, unspecified: Secondary | ICD-10-CM | POA: Insufficient documentation

## 2019-06-13 DIAGNOSIS — I110 Hypertensive heart disease with heart failure: Secondary | ICD-10-CM | POA: Insufficient documentation

## 2019-06-13 DIAGNOSIS — Z79899 Other long term (current) drug therapy: Secondary | ICD-10-CM | POA: Diagnosis not present

## 2019-06-13 LAB — COMPREHENSIVE METABOLIC PANEL
ALT: 26 U/L (ref 0–44)
AST: 23 U/L (ref 15–41)
Albumin: 4 g/dL (ref 3.5–5.0)
Alkaline Phosphatase: 86 U/L (ref 38–126)
Anion gap: 11 (ref 5–15)
BUN: 17 mg/dL (ref 6–20)
CO2: 27 mmol/L (ref 22–32)
Calcium: 9.3 mg/dL (ref 8.9–10.3)
Chloride: 102 mmol/L (ref 98–111)
Creatinine, Ser: 1.27 mg/dL — ABNORMAL HIGH (ref 0.61–1.24)
GFR calc Af Amer: >60 mL/min (ref >60)
GFR calc non Af Amer: >60 mL/min (ref >60)
Glucose, Bld: 94 mg/dL (ref 70–99)
Potassium: 4.6 mmol/L (ref 3.5–5.1)
Sodium: 140 mmol/L (ref 135–145)
Total Bilirubin: 1.3 mg/dL — ABNORMAL HIGH (ref 0.3–1.2)
Total Protein: 6.4 g/dL — ABNORMAL LOW (ref 6.5–8.1)

## 2019-06-13 LAB — CBC
HCT: 40.1 % (ref 39.0–52.0)
Hemoglobin: 13.7 g/dL (ref 13.0–17.0)
MCH: 33.8 pg (ref 26.0–34.0)
MCHC: 34.2 g/dL (ref 30.0–36.0)
MCV: 99 fL (ref 80.0–100.0)
Platelets: 181 10*3/uL (ref 150–400)
RBC: 4.05 MIL/uL — ABNORMAL LOW (ref 4.22–5.81)
RDW: 13.3 % (ref 11.5–15.5)
WBC: 5.6 10*3/uL (ref 4.0–10.5)
nRBC: 0 % (ref 0.0–0.2)

## 2019-06-13 LAB — TSH: TSH: 1.351 u[IU]/mL (ref 0.350–4.500)

## 2019-06-13 MED ORDER — RIVAROXABAN 20 MG PO TABS: 20.0000 mg | ORAL_TABLET | Freq: Every day | ORAL | 11 refills | Status: DC

## 2019-06-13 NOTE — Patient Instructions (Signed)
START Xarelto 20 mg one tab daily  Labs today We will only contact you if something comes back abnormal or we need to make some changes. Otherwise no news is good news!  Your physician recommends that you schedule a follow-up appointment in: 3 months with Dr Aundra Dubin and echo  Your physician has requested that you have an echocardiogram. Echocardiography is a painless test that uses sound waves to create images of your heart. It provides your doctor with information about the size and shape of your heart and how well your heart's chambers and valves are working. This procedure takes approximately one hour. There are no restrictions for this procedure.  Do the following things EVERYDAY: 1) Weigh yourself in the morning before breakfast. Write it down and keep it in a log. 2) Take your medicines as prescribed 3) Eat low salt foods-Limit salt (sodium) to 2000 mg per day.  4) Stay as active as you can everyday 5) Limit all fluids for the day to less than 2 liters At the [facility], you and your health needs are our priority. As part of our continuing mission to provide you with exceptional heart care, we have created designated Provider Care Teams. These Care Teams include your primary Cardiologist (physician) and Advanced Practice Providers (APPs- Physician Assistants and Nurse Practitioners) who all work together to provide you with the care you need, when you need it.   You may see any of the following providers on your designated Care Team at your next follow up: Marland Kitchen Dr [name] . Dr Loralie Champagne . Darrick Grinder, NP   Please be sure to bring in all your medications bottles to every appointment.

## 2019-06-13 NOTE — Progress Notes (Signed)
Advanced Heart Failure Clinic Note   Primary Care: Dr. Windle Guard, MD HF Cardiology: Dr. Shirlee Latch  HPI: [name] [name] is a 58 year old male with a past medical history of chronic systolic CHF (last EF 15% in April 2018), dilated NICM s/p BiV ICD, PAF with intermittent compliance with Xarelto, ETOH abuse, tobacco abuse, OSA on CPAP, and HTN.   Admitted 12/25/16-12/27/16 with acute on chronic systolic CHF, volume overload. He was diuresed 11 pounds on IV lasix.  Discharge weight was 294 pounds. It was felt that the etiology of his NICM was ETOH vs hypertension related.   Admitted 5/14-5/16/18 with volume overload, acute respiratory failure requiring Bipap. He was not taking his Entresto as he was confused about dosing and there was a mix up his pharmacy. He was diuresed with IV lasix, discharge weight was 295 pounds.   Admitted 05/30/17-06/02/17 with VT, ICD shock x 4. He is now on amiodarone and sotalol.  Today he returns for HF follow up.Overall feeling ok. He remains SOB with exertion but this is his baseline. Denies PND/Orthopnea.m using CPAP every night.  Appetite ok. No fever or chills. Weight at home  305-310.  Drinking 6-12 beers on the weekend.Taking all medications but has not been able to start xarelto.   Labs (9/18): K 3.8, creatinine 1.02 Labs (11/18): K 4.4, creatinine 1.19, TSH normal, LFTs normal Labs (11/05/18): K 4 Creatinine 1.05   PMH: 1. Chronic systolic CHF: Medtronic CRT-D.  Nonischemic cardiomyopathy. Possibly ETOH-related.  - LHC (2016): No CAD.  - Echo (2016): EF 20-25%, diffuse hypokinesis.  - Echo (4/18): EF 15%, normal RV size and systolic function.  - Echo (11/18): EF 20%, mildly dilated LV with mild LVH, normal RV size with mildly decreased RV systolic function.  2. Atrial fibrillation: Paroxysmal.   3. OSA: On CPAP.  4. ETOH abuse: Prior heavy ETOH.  5. H/o VT 6. COPD: Prior smoker.   ROS: All systems reviewed and negative except as per HPI.   Current Outpatient  Medications  Medication Sig Dispense Refill  . acetaminophen (TYLENOL) 325 MG tablet Take 2 tablets (650 mg total) by mouth every 4 (four) hours as needed for headache or mild pain.    Marland Kitchen albuterol (PROVENTIL HFA;VENTOLIN HFA) 108 (90 Base) MCG/ACT inhaler Inhale 1 puff into the lungs every 6 (six) hours as needed for wheezing or shortness of breath. 1 Inhaler 1  . amiodarone (PACERONE) 200 MG tablet Take 1 tablet by mouth once daily 30 tablet 2  . aspirin EC 325 MG tablet Take 1 tablet (325 mg total) by mouth 2 (two) times daily after a meal. Take x 1 month post op to decrease risk of blood clots. 60 tablet 0  . atorvastatin (LIPITOR) 80 MG tablet TAKE 1 TABLET BY MOUTH ONCE DAILY AT 6PM 30 tablet 3  . docusate sodium (COLACE) 100 MG capsule Take 1 capsule (100 mg total) by mouth 2 (two) times daily. 30 capsule 0  . FLUoxetine (PROZAC) 20 MG tablet Take 20 mg by mouth daily.    . potassium chloride SA (K-DUR) 20 MEQ tablet Take 2 tablets by mouth twice daily 120 tablet 0  . sacubitril-valsartan (ENTRESTO) 49-51 MG Take 1 tablet by mouth 2 (two) times daily. 60 tablet 5  . sotalol (BETAPACE) 80 MG tablet TAKE 1 TABLET BY MOUTH EVERY 12 HOURS 180 tablet 2  . spironolactone (ALDACTONE) 25 MG tablet Take 1 tablet (25 mg total) by mouth daily. 30 tablet 5  . tiZANidine (ZANAFLEX)  2 MG tablet Take 1 tablet (2 mg total) by mouth every 8 (eight) hours as needed for muscle spasms. 40 tablet 0  . torsemide (DEMADEX) 20 MG tablet TAKE 3 TABLETS BY MOUTH TWICE DAILY 180 tablet 0   No current facility-administered medications for this encounter.     No Known Allergies    Social History   Socioeconomic History  . Marital status: Divorced    Spouse name: Not on file  . Number of children: 2  . Years of education: Not on file  . Highest education level: Not on file  Occupational History  . Occupation: Copy  . Occupation: disabled, odd jobs  Social Needs  . Financial resource strain: Not on  file  . Food insecurity    Worry: Not on file    Inability: Not on file  . Transportation needs    Medical: Not on file    Non-medical: Not on file  Tobacco Use  . Smoking status: Former Smoker    Packs/day: 0.50    Years: 4.00    Pack years: 2.00    Types: Cigarettes    Quit date: 10/29/2005    Years since quitting: 13.6  . Smokeless tobacco: Never Used  . Tobacco comment: about a pack a week. social  Substance and Sexual Activity  . Alcohol use: Yes    Alcohol/week: 42.0 standard drinks    Types: 42 Cans of beer per week    Comment: 24 a week  . Drug use: Yes    Frequency: 7.0 times per week    Types: Marijuana    Comment: has discontinued  . Sexual activity: Yes    Partners: Female  Lifestyle  . Physical activity    Days per week: Not on file    Minutes per session: Not on file  . Stress: Not on file  Relationships  . Social Musician on phone: Not on file    Gets together: Not on file    Attends religious service: Not on file    Active member of club or organization: Not on file    Attends meetings of clubs or organizations: Not on file    Relationship status: Not on file  . Intimate partner violence    Fear of current or ex partner: Not on file    Emotionally abused: Not on file    Physically abused: Not on file    Forced sexual activity: Not on file  Other Topics Concern  . Not on file  Social History Narrative   ** Merged History Encounter **          Family History  Problem Relation Age of Onset  . Other Mother        cardiac surgery. late 1990s  . Heart Problems Mother        CABG AGE 29  . Congestive Heart Failure Father   . Healthy Father        AGE 14  . Coronary artery disease Other   . Healthy Brother        AGE 83  . Healthy Sister        AGE 64  . Healthy Sister        AGE 66  . Healthy Son   . Healthy Son   . Healthy Daughter     Vitals:   06/13/19 1057  BP: 98/74  Pulse: 87  SpO2: 96%  Weight: (!) 140.2 kg  (309 lb)  Wt Readings from Last 3 Encounters:  06/13/19 (!) 140.2 kg (309 lb)  05/30/19 (!) 142 kg (313 lb)  11/04/18 (!) 140 kg (308 lb 10.3 oz)     PHYSICAL EXAM: General:  Well appearing. No resp difficulty HEENT: normal Neck: supple. no JVD. Carotids 2+ bilat; no bruits. No lymphadenopathy or thryomegaly appreciated. Cor: PMI nondisplaced. Regular rate & rhythm. No rubs, gallops or murmurs. Lungs: clear Abdomen: soft, nontender, nondistended. No hepatosplenomegaly. No bruits or masses. Good bowel sounds. Extremities: no cyanosis, clubbing, rash, edema Neuro: alert & orientedx3, cranial nerves grossly intact. moves all 4 extremities w/o difficulty. Affect pleasant  ASSESSMENT & PLAN: 1. Chronic systolic CHF: NICM, Echo 05/7415 EF 15%. Normal cors in July 2016. Medtronic CRT-D. ETOH vs. Hypertension.  NYHA III. Volume status stable. Continue torsemide 60 mg BID.  - Continue entresto  49-51 mg twice a day. No room to increase today.  - Continue Spiro 25 mg daily - Intolerant to Coreg in the past with hypotension, he is on sotalol and amiodarone.  -Set up for repeat ECHO  2. PAF: Regular pulse on exam.  - Continue amiodarone 200 mg daily.  Check LFTs/TSH today.  Should have regular eye exam.  He has not been able to start xarelto. He was given 30 day free card today and [facility] pharmacy helping him with patient assistance.   - resrt xarelto today.  3. ETOH abuse: Suspect this contributes to cardiomyopathy.  Continues to drink 12 beers a week. Discussed cessation.  4. Tobacco abuse: Rare cigarettes now.   5. LA:GTXMIWOE closely by EP 6. OSA: Continue nightly CPAP.   Follow up in 3 months with an ECHO. Check LFTs, TSH, BMET today. Compliant with CPAP hs.     Darrick Grinder, NP 06/13/19

## 2019-06-15 ENCOUNTER — Other Ambulatory Visit: Payer: Self-pay | Admitting: Cardiology

## 2019-06-18 ENCOUNTER — Other Ambulatory Visit (HOSPITAL_COMMUNITY): Payer: Self-pay | Admitting: Cardiology

## 2019-06-28 ENCOUNTER — Other Ambulatory Visit: Payer: Self-pay | Admitting: Cardiology

## 2019-07-01 ENCOUNTER — Ambulatory Visit (INDEPENDENT_AMBULATORY_CARE_PROVIDER_SITE_OTHER): Payer: Medicare Other | Admitting: *Deleted

## 2019-07-01 DIAGNOSIS — I472 Ventricular tachycardia, unspecified: Secondary | ICD-10-CM

## 2019-07-01 DIAGNOSIS — I5022 Chronic systolic (congestive) heart failure: Secondary | ICD-10-CM

## 2019-07-01 LAB — CUP PACEART REMOTE DEVICE CHECK
Battery Remaining Longevity: 22 mo
Battery Voltage: 2.93 V
Brady Statistic AP VP Percent: 60.51 %
Brady Statistic AP VS Percent: 0.17 %
Brady Statistic AS VP Percent: 38.35 %
Brady Statistic AS VS Percent: 0.97 %
Brady Statistic RA Percent Paced: 59.44 %
Brady Statistic RV Percent Paced: 97.08 %
Date Time Interrogation Session: 20201013113726
HighPow Impedance: 59 Ohm
Implantable Lead Implant Date: 20160727
Implantable Lead Implant Date: 20160727
Implantable Lead Implant Date: 20160727
Implantable Lead Location: 753858
Implantable Lead Location: 753859
Implantable Lead Location: 753860
Implantable Lead Model: 4598
Implantable Lead Model: 5076
Implantable Lead Model: 6935
Implantable Pulse Generator Implant Date: 20160727
Lead Channel Impedance Value: 342 Ohm
Lead Channel Impedance Value: 361 Ohm
Lead Channel Impedance Value: 361 Ohm
Lead Channel Impedance Value: 361 Ohm
Lead Channel Impedance Value: 399 Ohm
Lead Channel Impedance Value: 418 Ohm
Lead Channel Impedance Value: 418 Ohm
Lead Channel Impedance Value: 513 Ohm
Lead Channel Impedance Value: 551 Ohm
Lead Channel Impedance Value: 551 Ohm
Lead Channel Impedance Value: 589 Ohm
Lead Channel Impedance Value: 589 Ohm
Lead Channel Impedance Value: 589 Ohm
Lead Channel Pacing Threshold Amplitude: 0.5 V
Lead Channel Pacing Threshold Amplitude: 0.5 V
Lead Channel Pacing Threshold Amplitude: 1.375 V
Lead Channel Pacing Threshold Pulse Width: 0.4 ms
Lead Channel Pacing Threshold Pulse Width: 0.4 ms
Lead Channel Pacing Threshold Pulse Width: 0.6 ms
Lead Channel Sensing Intrinsic Amplitude: 11.5 mV
Lead Channel Sensing Intrinsic Amplitude: 11.5 mV
Lead Channel Sensing Intrinsic Amplitude: 2.125 mV
Lead Channel Sensing Intrinsic Amplitude: 2.125 mV
Lead Channel Setting Pacing Amplitude: 1.5 V
Lead Channel Setting Pacing Amplitude: 2 V
Lead Channel Setting Pacing Amplitude: 2.5 V
Lead Channel Setting Pacing Pulse Width: 0.4 ms
Lead Channel Setting Pacing Pulse Width: 0.6 ms
Lead Channel Setting Sensing Sensitivity: 0.3 mV

## 2019-07-02 ENCOUNTER — Ambulatory Visit (INDEPENDENT_AMBULATORY_CARE_PROVIDER_SITE_OTHER): Payer: Medicare Other

## 2019-07-02 DIAGNOSIS — Z95810 Presence of automatic (implantable) cardiac defibrillator: Secondary | ICD-10-CM

## 2019-07-02 DIAGNOSIS — I5022 Chronic systolic (congestive) heart failure: Secondary | ICD-10-CM

## 2019-07-04 NOTE — Progress Notes (Signed)
EPIC Encounter for ICM Monitoring  Patient Name: [name] [name] is a 58 y.o. male Date: 07/04/2019 Primary Care Physican: [name], MD Primary Cardiologist:[name] Electrophysiologist:[name] Bi-V Pacing:97.1% 06/13/2019 Weight: 309 lbs  Since 30-May-2019  AT/AF                   1 episode  Time in AT/AF      <0.1 hr/day (<0.1%)  Longest AT/AF      6 minutes  Transmission reviewed.   OptivolThoracic impedance close to baseline normal.   Prescribed:Torsemide20 mg 3 tablets (60 mg total) twice a day. Potassium 12mEq 2 tablets (40 mEq total) twice a day.  Labs: 06/13/2019 Creatinine 1.27, BUN 17, Potassium 4.6, Sodium 140, GFR >60 11/05/2018 Creatinine1.05, BUN15, Potassium4.3, Sodium136, GFR>60 10/29/2018 Creatinine0.94, BUN16, Potassium3.7, Sodium138, GFR>60  10/02/2018 Creatinine0.97, BUN15, Potassium4.4, Sodium140, [id number] A complete set of results can be found in Results Review.  Recommendations: None  Follow-up plan: ICM clinic phone appointment on 08/04/2019.   91 day device clinic remote transmission 09/30/2019.  Office appt 09/16/2019 with Dr. Aundra Dubin.    Copy of ICM check sent to Dr. Caryl Comes.   3 month ICM trend: 07/01/2019    1 Year ICM trend:       Rosalene Billings, RN 07/04/2019 3:59 PM

## 2019-07-08 ENCOUNTER — Other Ambulatory Visit: Payer: Self-pay | Admitting: Cardiology

## 2019-07-11 NOTE — Progress Notes (Signed)
Remote ICD transmission.   

## 2019-07-12 ENCOUNTER — Other Ambulatory Visit: Payer: Self-pay | Admitting: Cardiology

## 2019-07-15 ENCOUNTER — Other Ambulatory Visit: Payer: Self-pay | Admitting: Cardiology

## 2019-08-04 ENCOUNTER — Ambulatory Visit (INDEPENDENT_AMBULATORY_CARE_PROVIDER_SITE_OTHER): Payer: Medicare Other

## 2019-08-04 DIAGNOSIS — I5022 Chronic systolic (congestive) heart failure: Secondary | ICD-10-CM | POA: Diagnosis not present

## 2019-08-04 DIAGNOSIS — Z95810 Presence of automatic (implantable) cardiac defibrillator: Secondary | ICD-10-CM

## 2019-08-05 ENCOUNTER — Other Ambulatory Visit (HOSPITAL_COMMUNITY): Payer: Self-pay | Admitting: Cardiology

## 2019-08-05 NOTE — Progress Notes (Signed)
EPIC Encounter for ICM Monitoring  Patient Name: [name] [name] is a 58 y.o. male Date: 08/05/2019 Primary Care Physican: [name], MD Primary Cardiologist:[name] Electrophysiologist:[name] Bi-V Pacing:98.8% 08/05/2019 Weight: 308 lbs   Spoke with patient and he is asymptomatic for fluid accumulation.  He has been eating more restaurant foods in the last 2 months.  Advised restaurant foods are typically high in sodium.  OptivolThoracic impedance trending below baseline normal suggesting possible fluid accumulation since mid September.  Prescribed:Torsemide20 mg 3 tablets (60 mg total) twice a day. Potassium 14mEq 2 tablets (40 mEq total) twice a day.  Labs: 06/13/2019 Creatinine 1.27, BUN 17, Potassium 4.6, Sodium 140, GFR >60 11/05/2018 Creatinine1.05, BUN15, Potassium4.3, Sodium136, GFR>60 10/29/2018 Creatinine0.94, BUN16, Potassium3.7, Sodium138, GFR>60  10/02/2018 Creatinine0.97, BUN15, Potassium4.4, Sodium140, [id number] A complete set of results can be found in Results Review.  Recommendations: Advised to limit salt intake to < 2000 mg daily.  Follow-up plan: ICM clinic phone appointment on 08/11/2019 to recheck fluid levels.    Office appt 09/16/2019 with Dr. Aundra Dubin.    Copy of ICM check sent to Dr. Caryl Comes and Dr Aundra Dubin for review and recommendations if needed.   3 month ICM trend: 08/04/2019    1 Year ICM trend:       Rosalene Billings, RN 08/05/2019 2:41 PM

## 2019-08-11 NOTE — Progress Notes (Signed)
Increase torsemide to 80 bid x 2 days then 80 qam/60 qpm. BMET 10 days.

## 2019-08-11 NOTE — Progress Notes (Addendum)
Attempted call to patient to provide Dr Claris Gladden recommendations and no answer.  Left message for call back.  Next remote transmission rescheduled to 08/18/2019 (manual transmission)

## 2019-08-12 MED ORDER — TORSEMIDE 20 MG PO TABS: ORAL_TABLET | ORAL | 1 refills | Status: DC

## 2019-08-12 NOTE — Progress Notes (Signed)
Spoke with patient.  Advised of Dr Claris Gladden recommendations:  1.  Increase torsemide to 80 bid x 2 days then 80 qam/60 qpm.  2.  BMET 10 days.   Patient verbalized understanding and Torsemide does not need refilling since he has supply on hand.  BMET scheduled on 08/22/2019 at 10:30 AM at HF clinic.  Advised to call if he has any questions or concerns.  Recheck remote transmission on 08/18/2019.

## 2019-08-12 NOTE — Addendum Note (Signed)
Addended by: Rosalene Billings on: 08/12/2019 11:09 AM   Modules accepted: Orders

## 2019-08-19 ENCOUNTER — Telehealth: Payer: Self-pay

## 2019-08-19 ENCOUNTER — Other Ambulatory Visit (HOSPITAL_COMMUNITY): Payer: Self-pay | Admitting: Cardiology

## 2019-08-19 DIAGNOSIS — IMO0002 Reserved for concepts with insufficient information to code with codable children: Secondary | ICD-10-CM

## 2019-08-19 DIAGNOSIS — R9430 Abnormal result of cardiovascular function study, unspecified: Secondary | ICD-10-CM

## 2019-08-19 HISTORY — DX: Abnormal result of cardiovascular function study, unspecified: R94.30

## 2019-08-19 HISTORY — DX: Reserved for concepts with insufficient information to code with codable children: IMO0002

## 2019-08-19 NOTE — Telephone Encounter (Signed)
Left message for patient to remind of missed remote transmission.  

## 2019-08-22 ENCOUNTER — Other Ambulatory Visit: Payer: Self-pay

## 2019-08-22 ENCOUNTER — Ambulatory Visit (HOSPITAL_COMMUNITY)
Admission: RE | Admit: 2019-08-22 | Discharge: 2019-08-22 | Disposition: A | Discharge status: Home / Self Care | Payer: Medicare Other | Source: Ambulatory Visit | Attending: Internal Medicine | Admitting: Internal Medicine

## 2019-08-22 DIAGNOSIS — I5022 Chronic systolic (congestive) heart failure: Secondary | ICD-10-CM

## 2019-08-22 DIAGNOSIS — Z95810 Presence of automatic (implantable) cardiac defibrillator: Secondary | ICD-10-CM | POA: Diagnosis not present

## 2019-08-22 LAB — BASIC METABOLIC PANEL
Anion gap: 8 (ref 5–15)
BUN: 14 mg/dL (ref 6–20)
CO2: 30 mmol/L (ref 22–32)
Calcium: 9.4 mg/dL (ref 8.9–10.3)
Chloride: 103 mmol/L (ref 98–111)
Creatinine, Ser: 1.19 mg/dL (ref 0.61–1.24)
GFR calc Af Amer: >60 mL/min (ref >60)
GFR calc non Af Amer: >60 mL/min (ref >60)
Glucose, Bld: 94 mg/dL (ref 70–99)
Potassium: 4 mmol/L (ref 3.5–5.1)
Sodium: 141 mmol/L (ref 135–145)

## 2019-08-25 NOTE — Progress Notes (Signed)
No ICM remote transmission received for 08/18/2019 and next ICM transmission scheduled for 10/01/2019.

## 2019-08-28 ENCOUNTER — Other Ambulatory Visit: Payer: Self-pay | Admitting: Cardiology

## 2019-09-02 ENCOUNTER — Telehealth (HOSPITAL_COMMUNITY): Payer: Self-pay | Admitting: Pharmacy Technician

## 2019-09-02 NOTE — Telephone Encounter (Signed)
Spoke to patient regarding re-enrollment of Entresto in [employer]. Will take the application to check in desk for the patient to sign. He has an appointment and will sign it on the 29th if he cannot come to the clinic sooner.  Touched base with him on affordability of Xarelto, as I know that was a barrier in the past. Patient states that he has been having no problem getting it. co-pay has been $28.  Will follow up.  Charlann Boxer, CPhT

## 2019-09-15 ENCOUNTER — Telehealth (HOSPITAL_COMMUNITY): Payer: Self-pay

## 2019-09-15 NOTE — Telephone Encounter (Signed)

## 2019-09-16 ENCOUNTER — Encounter (HOSPITAL_COMMUNITY): Payer: Self-pay

## 2019-09-16 ENCOUNTER — Ambulatory Visit (HOSPITAL_COMMUNITY)
Admission: RE | Admit: 2019-09-16 | Discharge: 2019-09-16 | Disposition: A | Discharge status: Home / Self Care | Payer: Medicare Other | Source: Ambulatory Visit | Attending: Adult Health | Admitting: Adult Health

## 2019-09-16 ENCOUNTER — Other Ambulatory Visit (HOSPITAL_COMMUNITY): Payer: Self-pay | Admitting: Cardiology

## 2019-09-16 ENCOUNTER — Encounter (HOSPITAL_COMMUNITY): Payer: Self-pay | Admitting: Cardiology

## 2019-09-16 ENCOUNTER — Other Ambulatory Visit: Payer: Self-pay

## 2019-09-16 ENCOUNTER — Ambulatory Visit (HOSPITAL_BASED_OUTPATIENT_CLINIC_OR_DEPARTMENT_OTHER)
Admission: RE | Admit: 2019-09-16 | Discharge: 2019-09-16 | Disposition: A | Discharge status: Home / Self Care | Payer: Medicare Other | Source: Ambulatory Visit | Attending: Cardiology | Admitting: Cardiology

## 2019-09-16 VITALS — BP 89/55 | HR 89 | Wt 316.0 lb

## 2019-09-16 DIAGNOSIS — F1010 Alcohol abuse, uncomplicated: Secondary | ICD-10-CM | POA: Diagnosis not present

## 2019-09-16 DIAGNOSIS — Z8249 Family history of ischemic heart disease and other diseases of the circulatory system: Secondary | ICD-10-CM | POA: Diagnosis not present

## 2019-09-16 DIAGNOSIS — I480 Paroxysmal atrial fibrillation: Secondary | ICD-10-CM | POA: Diagnosis not present

## 2019-09-16 DIAGNOSIS — I472 Ventricular tachycardia: Secondary | ICD-10-CM | POA: Insufficient documentation

## 2019-09-16 DIAGNOSIS — I428 Other cardiomyopathies: Secondary | ICD-10-CM | POA: Insufficient documentation

## 2019-09-16 DIAGNOSIS — Z79899 Other long term (current) drug therapy: Secondary | ICD-10-CM | POA: Diagnosis not present

## 2019-09-16 DIAGNOSIS — Z7982 Long term (current) use of aspirin: Secondary | ICD-10-CM | POA: Insufficient documentation

## 2019-09-16 DIAGNOSIS — I110 Hypertensive heart disease with heart failure: Secondary | ICD-10-CM | POA: Diagnosis present

## 2019-09-16 DIAGNOSIS — Z95810 Presence of automatic (implantable) cardiac defibrillator: Secondary | ICD-10-CM | POA: Diagnosis not present

## 2019-09-16 DIAGNOSIS — J449 Chronic obstructive pulmonary disease, unspecified: Secondary | ICD-10-CM | POA: Insufficient documentation

## 2019-09-16 DIAGNOSIS — Z87891 Personal history of nicotine dependence: Secondary | ICD-10-CM | POA: Diagnosis not present

## 2019-09-16 DIAGNOSIS — I5022 Chronic systolic (congestive) heart failure: Secondary | ICD-10-CM

## 2019-09-16 DIAGNOSIS — G4733 Obstructive sleep apnea (adult) (pediatric): Secondary | ICD-10-CM | POA: Insufficient documentation

## 2019-09-16 DIAGNOSIS — Z7901 Long term (current) use of anticoagulants: Secondary | ICD-10-CM | POA: Insufficient documentation

## 2019-09-16 DIAGNOSIS — Z7984 Long term (current) use of oral hypoglycemic drugs: Secondary | ICD-10-CM | POA: Diagnosis not present

## 2019-09-16 LAB — CBC
HCT: 35.2 % — ABNORMAL LOW (ref 39.0–52.0)
Hemoglobin: 11.8 g/dL — ABNORMAL LOW (ref 13.0–17.0)
MCH: 33.2 pg (ref 26.0–34.0)
MCHC: 33.5 g/dL (ref 30.0–36.0)
MCV: 99.2 fL (ref 80.0–100.0)
Platelets: 206 10*3/uL (ref 150–400)
RBC: 3.55 MIL/uL — ABNORMAL LOW (ref 4.22–5.81)
RDW: 13 % (ref 11.5–15.5)
WBC: 4.3 10*3/uL (ref 4.0–10.5)
nRBC: 0 % (ref 0.0–0.2)

## 2019-09-16 LAB — COMPREHENSIVE METABOLIC PANEL
ALT: 18 U/L (ref 0–44)
AST: 15 U/L (ref 15–41)
Albumin: 3.9 g/dL (ref 3.5–5.0)
Alkaline Phosphatase: 72 U/L (ref 38–126)
Anion gap: 8 (ref 5–15)
BUN: 11 mg/dL (ref 6–20)
CO2: 29 mmol/L (ref 22–32)
Calcium: 9 mg/dL (ref 8.9–10.3)
Chloride: 103 mmol/L (ref 98–111)
Creatinine, Ser: 1.01 mg/dL (ref 0.61–1.24)
GFR calc Af Amer: >60 mL/min (ref >60)
GFR calc non Af Amer: >60 mL/min (ref >60)
Glucose, Bld: 71 mg/dL (ref 70–99)
Potassium: 4.1 mmol/L (ref 3.5–5.1)
Sodium: 140 mmol/L (ref 135–145)
Total Bilirubin: 0.6 mg/dL (ref 0.3–1.2)
Total Protein: 6.4 g/dL — ABNORMAL LOW (ref 6.5–8.1)

## 2019-09-16 LAB — TSH: TSH: 1.337 u[IU]/mL (ref 0.350–4.500)

## 2019-09-16 MED ORDER — TORSEMIDE 20 MG PO TABS: 40.0000 mg | ORAL_TABLET | Freq: Two times a day (BID) | ORAL | 3 refills | Status: DC

## 2019-09-16 MED ORDER — TORSEMIDE 20 MG PO TABS: ORAL_TABLET | ORAL | 3 refills | Status: DC

## 2019-09-16 MED ORDER — DAPAGLIFLOZIN PROPANEDIOL 10 MG PO TABS: 10.0000 mg | ORAL_TABLET | Freq: Every day | ORAL | 11 refills | Status: DC

## 2019-09-16 NOTE — Progress Notes (Unsigned)
Pt

## 2019-09-16 NOTE — Telephone Encounter (Signed)
Sent in [employer] re-enrollment application.  Patient was started on Farxiga, co-pay is $40. Considering what he already pays for prescriptions out of pocket, this co-pay is a bit much for him. Started application for [place].   Called patient and let him know that if we are able to get his OOP report from his pharmacy and signature in the next few days, that we can send his application in while being able to use this years expenses. That would be better for him than having to start over in the new year with OOP costs.  He is going to work on that and I am going to take his application to the front check in desk to sign.  Will follow up with him.  Charlann Boxer, CPhT

## 2019-09-16 NOTE — Progress Notes (Signed)
Advanced Heart Failure Clinic Note   Primary Care: Dr. Claris Gower, MD HF Cardiology: Dr. Aundra Dubin  HPI: [name] [name] is a 58 y.o. male with a past medical history of chronic systolic CHF  (EF 81% in April 2018), dilated NICM s/p BiV ICD, PAF with intermittent compliance with Xarelto, ETOH abuse, tobacco abuse, OSA on CPAP, and HTN.   Admitted 12/25/16-12/27/16 with acute on chronic systolic CHF, volume overload. He was diuresed 11 pounds on IV lasix.  Discharge weight was 294 pounds. It was felt that the etiology of his NICM was ETOH vs hypertension related.   Admitted 5/14-5/16/18 with volume overload, acute respiratory failure requiring Bipap. He was not taking his Entresto as he was confused about dosing and there was a mix up his pharmacy. He was diuresed with IV lasix, discharge weight was 295 pounds.   Admitted 05/30/17-06/02/17 with VT, ICD shock x 4. He is now on amiodarone and sotalol.  Echo was done today and reviewed, EF 20-25%, severe LV dilation, moderate LVH, normal RV, small IVC.   Today he returns for HF followup.  He has been doing well symptomatically.  No dyspnea walking on flat ground or up a flight of stairs.  No orthopnea/PND.  No chest pain.  He works on cars as a hobby and has no limitations when it comes to doing this.  No palpitations.  He is in NSR today. He is drinking about 24 beers/week.   ECG (personally reviewed): NSR, BiV paced but QRS wide at 218 msec.   Labs (9/18): K 3.8, creatinine 1.02 Labs (11/18): K 4.4, creatinine 1.19, TSH normal, LFTs normal Labs (11/05/18): K 4 Creatinine 1.05  Labs (12/20): K 4, creatinine 1.19  Medtronic device interrogation: Fluid index > threshold x months (?accurate), no AF/VT.   PMH: 1. Chronic systolic CHF: Medtronic CRT-D.  Nonischemic cardiomyopathy. Possibly ETOH-related.  - LHC (2016): No CAD.  - Echo (2016): EF 20-25%, diffuse hypokinesis.  - Echo (4/18): EF 15%, normal RV size and systolic function.  - Echo (11/18): EF  20%, mildly dilated LV with mild LVH, normal RV size with mildly decreased RV systolic function.  - Echo (12/20): EF 20-25%, severe LV dilation, moderate LVH, normal RV, small IVC.  2. Atrial fibrillation: Paroxysmal.   3. OSA: On CPAP.  4. ETOH abuse: Prior heavy ETOH.  5. H/o VT 6. COPD: Prior smoker.   ROS: All systems reviewed and negative except as per HPI.   Current Outpatient Medications  Medication Sig Dispense Refill  . acetaminophen (TYLENOL) 325 MG tablet Take 2 tablets (650 mg total) by mouth every 4 (four) hours as needed for headache or mild pain.    Marland Kitchen albuterol (PROVENTIL HFA;VENTOLIN HFA) 108 (90 Base) MCG/ACT inhaler Inhale 1 puff into the lungs every 6 (six) hours as needed for wheezing or shortness of breath. 1 Inhaler 1  . aspirin EC 325 MG tablet Take 1 tablet (325 mg total) by mouth 2 (two) times daily after a meal. Take x 1 month post op to decrease risk of blood clots. 60 tablet 0  . FLUoxetine (PROZAC) 20 MG tablet Take 20 mg by mouth daily.    Marland Kitchen PACERONE 200 MG tablet Take 1 tablet by mouth once daily 30 tablet 0  . potassium chloride SA (KLOR-CON) 20 MEQ tablet Take 2 tablets by mouth twice daily 120 tablet 0  . rivaroxaban (XARELTO) 20 MG TABS tablet Take 1 tablet (20 mg total) by mouth daily with supper. 30 tablet 11  .  sacubitril-valsartan (ENTRESTO) 49-51 MG Take 1 tablet by mouth 2 (two) times daily. 60 tablet 5  . sotalol (BETAPACE) 80 MG tablet TAKE 1 TABLET BY MOUTH EVERY 12 HOURS 180 tablet 2  . spironolactone (ALDACTONE) 25 MG tablet TAKE 1 TABLET BY MOUTH ONCE DAILY (NEED  OFFICE  VISIT  FOR  FUTURE  REFILLS) 30 tablet 0  . tiZANidine (ZANAFLEX) 2 MG tablet Take 1 tablet (2 mg total) by mouth every 8 (eight) hours as needed for muscle spasms. 40 tablet 0  . torsemide (DEMADEX) 20 MG tablet Take 3 tablets (60 mg total) by mouth every morning AND 2 tablets (40 mg total) every evening. Please cancel all previous orders for current medication. Change in dosage  or pill size.. 450 tablet 3  . atorvastatin (LIPITOR) 80 MG tablet TAKE 1 TABLET BY MOUTH ONCE DAILY AT  6  PM 90 tablet 3  . dapagliflozin propanediol (FARXIGA) 10 MG TABS tablet Take 10 mg by mouth daily before breakfast. 30 tablet 11   No current facility-administered medications for this encounter.    No Known Allergies    Social History   Socioeconomic History  . Marital status: Divorced    Spouse name: Not on file  . Number of children: 2  . Years of education: Not on file  . Highest education level: Not on file  Occupational History  . Occupation: Copy  . Occupation: disabled, odd jobs  Tobacco Use  . Smoking status: Former Smoker    Packs/day: 0.50    Years: 4.00    Pack years: 2.00    Types: Cigarettes    Quit date: 10/29/2005    Years since quitting: 13.8  . Smokeless tobacco: Never Used  . Tobacco comment: about a pack a week. social  Substance and Sexual Activity  . Alcohol use: Yes    Alcohol/week: 42.0 standard drinks    Types: 42 Cans of beer per week    Comment: 24 a week  . Drug use: Yes    Frequency: 7.0 times per week    Types: Marijuana    Comment: has discontinued  . Sexual activity: Yes    Partners: Female  Other Topics Concern  . Not on file  Social History Narrative   ** Merged History Encounter **       Social Determinants of Health   Financial Resource Strain:   . Difficulty of Paying Living Expenses: Not on file  Food Insecurity:   . Worried About Programme researcher, broadcasting/film/video in the Last Year: Not on file  . Ran Out of Food in the Last Year: Not on file  Transportation Needs:   . Lack of Transportation (Medical): Not on file  . Lack of Transportation (Non-Medical): Not on file  Physical Activity:   . Days of Exercise per Week: Not on file  . Minutes of Exercise per Session: Not on file  Stress:   . Feeling of Stress : Not on file  Social Connections:   . Frequency of Communication with Friends and Family: Not on file  .  Frequency of Social Gatherings with Friends and Family: Not on file  . Attends Religious Services: Not on file  . Active Member of Clubs or Organizations: Not on file  . Attends Banker Meetings: Not on file  . Marital Status: Not on file  Intimate Partner Violence:   . Fear of Current or Ex-Partner: Not on file  . Emotionally Abused: Not on file  .  Physically Abused: Not on file  . Sexually Abused: Not on file      Family History  Problem Relation Age of Onset  . Other Mother        cardiac surgery. late 1990s  . Heart Problems Mother        CABG AGE 69  . Congestive Heart Failure Father   . Healthy Father        AGE 21  . Coronary artery disease Other   . Healthy Brother        AGE 60  . Healthy Sister        AGE 68  . Healthy Sister        AGE 62  . Healthy Son   . Healthy Son   . Healthy Daughter     Vitals:   09/16/19 1211  BP: (!) 89/55  Pulse: 89  SpO2: 97%  Weight: (!) 143.3 kg (316 lb)   Wt Readings from Last 3 Encounters:  09/16/19 (!) 143.3 kg (316 lb)  06/13/19 (!) 140.2 kg (309 lb)  05/30/19 (!) 142 kg (313 lb)     PHYSICAL EXAM: General: NAD Neck: No JVD, no thyromegaly or thyroid nodule.  Lungs: Clear to auscultation bilaterally with normal respiratory effort. CV: Nondisplaced PMI.  Heart regular S1/S2, no S3/S4, no murmur.  No peripheral edema.  No carotid bruit.  Normal pedal pulses.  Abdomen: Soft, nontender, no hepatosplenomegaly, no distention.  Skin: Intact without lesions or rashes.  Neurologic: Alert and oriented x 3.  Psych: Normal affect. Extremities: No clubbing or cyanosis.  HEENT: Normal.   ASSESSMENT & PLAN: 1. Chronic systolic CHF: NICM, Echo 12/2016 EF 15%. Normal coronaries on cath in July 2016. Medtronic CRT-D device. ETOH vs HTN vs myocarditis as cause of cardiomyopathy. Echo today was reviewed, EF remains 20-25%.  NYHA class II symptoms.  He is not volume overloaded on exam though fluid index > threshold on  Optivol.  Not sure of the significance of this as FI has been > threshold for months.  - I will start him on Farxiga 10 mg daily. With starting Farxiga, will decrease torsemide to 60 qam/40 qpm.  BMET today and again in 10 days.  - Continue Entresto  49-51 mg twice a day. No BP room to increase today.  - Continue spironolactone 25 mg daily - Intolerant to Coreg in the past with hypotension, he is on sotalol and amiodarone.  - He needs to cut back on ETOH as he is still drinking fairly heavily.  - QRS is wide despite BiV pacing, will ask EP to take a look at his ECG to see if this needs to be addressed.  2. Paroxysmal atrial fibrillation: He is in NSR today.  - Continue amiodarone 200 mg daily.  Check LFTs/TSH today.  Should have regular eye exam.  - Continue Xarelto 20 mg daily.  CBC today.   3. ETOH abuse: Suspect this contributes to cardiomyopathy.  Still drinking heavily, I strongly encouraged him to quit.   4. Tobacco abuse: No longer smoking.   5. VT: Followed closely by EP, he is now on amiodarone and sotalol.  6. OSA: Continue nightly CPAP.   Followup with NP/PA in 1 month.   Marca Ancona, MD 09/16/19

## 2019-09-16 NOTE — Patient Instructions (Addendum)
Lab work done today. We will notify you of any abnormal lab work. No news is good news!  Lab work will need to be done again in 10 days.   EKG done today.   START Farxiga 10mg  tab daily.  DECREASE Torsemide to 40mg  (2 tabs) two times daily.  Please follow up with the [facility] in 1 month.  At the [facility], you and your health needs are our priority. As part of our continuing mission to provide you with exceptional heart care, we have created designated Provider Care Teams. These Care Teams include your primary Cardiologist (physician) and Advanced Practice Providers (APPs- Physician Assistants and Nurse Practitioners) who all work together to provide you with the care you need, when you need it.   You may see any of the following providers on your designated Care Team at your next follow up: Marland Kitchen Dr [name] . Dr Loralie Champagne . Darrick Grinder, NP . Lyda Jester, PA . Audry Riles, PharmD   Please be sure to bring in all your medications bottles to every appointment.

## 2019-09-16 NOTE — Progress Notes (Signed)
Based on medtronic report MD advise to decrease torsemide to 60 in the morning and 40 in the evening with the start of Farxiga. Pt was called and advised of this change. Verbalized understanding

## 2019-09-16 NOTE — Progress Notes (Signed)
*  PRELIMINARY RESULTS* Echocardiogram 2D Echocardiogram has been performed.  Leavy Cella 09/16/2019, 12:12 PM

## 2019-09-22 ENCOUNTER — Telehealth: Payer: Self-pay | Admitting: Emergency Medicine

## 2019-09-22 ENCOUNTER — Telehealth (HOSPITAL_COMMUNITY): Payer: Self-pay

## 2019-09-22 NOTE — Telephone Encounter (Signed)
ICM call to patient and discussed Optivol fluid accumulation.  Patient tends to eat restaurant foods and advised to monitor salt intake which may cause possible fluid accumulation. He confirmed he will have BMET drawn tomorrow as ordered by Dr Shirlee Latch.  Advised will recheck remote transmission fluid levels on 09/25/2019 and to call if he experiences any further changes in condition or ICD device shock.

## 2019-09-22 NOTE — Telephone Encounter (Signed)
-----   Message from [name], MD sent at 09/22/2019 12:34 PM EST ----- Had ICD shock, needs BMET asap

## 2019-09-22 NOTE — Telephone Encounter (Signed)
I CAN Not find any literature to suggest the dapagliflozin was responsible.  For now will continue.  I have reached out to Dr DM to get his thoughts  Thanks SK

## 2019-09-22 NOTE — Telephone Encounter (Signed)
LMOM. Received alert that 09/19/19@ 1731  patient had episode of VT that was treated unsuccessfully by ATP x 3 and terminated after 36 J shock was received. Episode lasted 26 seconds. Need to assess for sx and give [place] DMV driving restrictions.

## 2019-09-22 NOTE — Telephone Encounter (Signed)
Spoke with patient advised the need for lab work after ICD shock. Pt states he can come in tomorrow. appt time given. Verbalized appreciation

## 2019-09-22 NOTE — Telephone Encounter (Signed)
Patient reports he was sitting in his chair 09/19/19 around 1730 and felt a little dizzy and then felt the shock. Patient reports he felt fine after the shock. Asymptomatic today.Patient started [place] after seeing Dr Shirlee Latch on 09/16/19. Shock plan reviewed. ED precautions given. Will forward to Short RN in [facility] clinic due to elevated Optivol.

## 2019-09-23 ENCOUNTER — Other Ambulatory Visit: Payer: Self-pay

## 2019-09-23 ENCOUNTER — Ambulatory Visit (HOSPITAL_COMMUNITY)
Admission: RE | Admit: 2019-09-23 | Discharge: 2019-09-23 | Disposition: A | Discharge status: Home / Self Care | Payer: Medicare Other | Source: Ambulatory Visit | Attending: Cardiology | Admitting: Cardiology

## 2019-09-23 DIAGNOSIS — I5022 Chronic systolic (congestive) heart failure: Secondary | ICD-10-CM | POA: Diagnosis not present

## 2019-09-23 LAB — BASIC METABOLIC PANEL
Anion gap: 10 (ref 5–15)
BUN: 11 mg/dL (ref 6–20)
CO2: 30 mmol/L (ref 22–32)
Calcium: 9.2 mg/dL (ref 8.9–10.3)
Chloride: 100 mmol/L (ref 98–111)
Creatinine, Ser: 1.02 mg/dL (ref 0.61–1.24)
GFR calc Af Amer: >60 mL/min (ref >60)
GFR calc non Af Amer: >60 mL/min (ref >60)
Glucose, Bld: 108 mg/dL — ABNORMAL HIGH (ref 70–99)
Potassium: 4 mmol/L (ref 3.5–5.1)
Sodium: 140 mmol/L (ref 135–145)

## 2019-09-25 ENCOUNTER — Ambulatory Visit (INDEPENDENT_AMBULATORY_CARE_PROVIDER_SITE_OTHER): Payer: Medicare Other

## 2019-09-25 DIAGNOSIS — Z95810 Presence of automatic (implantable) cardiac defibrillator: Secondary | ICD-10-CM | POA: Diagnosis not present

## 2019-09-25 DIAGNOSIS — I5022 Chronic systolic (congestive) heart failure: Secondary | ICD-10-CM | POA: Diagnosis not present

## 2019-09-26 ENCOUNTER — Other Ambulatory Visit (HOSPITAL_COMMUNITY): Payer: Medicare Other

## 2019-09-26 ENCOUNTER — Other Ambulatory Visit: Payer: Self-pay | Admitting: Internal Medicine

## 2019-09-26 ENCOUNTER — Other Ambulatory Visit: Payer: Self-pay | Admitting: Cardiology

## 2019-09-26 ENCOUNTER — Telehealth: Payer: Self-pay | Admitting: *Deleted

## 2019-09-26 DIAGNOSIS — I472 Ventricular tachycardia, unspecified: Secondary | ICD-10-CM

## 2019-09-26 MED ORDER — AMIODARONE HCL 200 MG PO TABS: 200.0000 mg | ORAL_TABLET | Freq: Two times a day (BID) | ORAL | 0 refills | Status: DC

## 2019-09-26 NOTE — Telephone Encounter (Signed)
Carelink transmission received showing VT episode on 09/22/19, ATP x3 accelerated VT, transitioned to VF after first 35J shock. VF terminated with second 35J shock. 1 NSVT episode on 09/24/19, 21 beats at 217bpm.  Spoke with patient. He reports that he was sitting, watching TV at time of episodes on 1/4. Felt a little dizzy prior to first shock, but denies LOC. No other associated symptoms. Pt reports compliance with cardiac medications. Patient aware of [place] DMV driving restrictions x6 months and of shock plan.   Pt he stopped [place] on 1/2 or 1/3 because he feels that his treated episode on 1/1 was related to starting this medication. Explained that per Dr. Odessa Fleming research, there is no literature to suggest that Marcelline Deist is responsible. Pt does not plan to resume this medication at this time. Pt had BMET drawn on 09/23/19. Will forward to Dr. Graciela Husbands and Dr. Shirlee Latch for review and recommendations.

## 2019-09-26 NOTE — Telephone Encounter (Signed)
He is already on amiodarone and sotalol.  Would increase amiodarone to 200 mg bid and have him get BMET and Mg checked. Brett Canales, do you have any further ideas?

## 2019-09-26 NOTE — Progress Notes (Signed)
EPIC Encounter for ICM Monitoring  Patient Name: [name] [name] is a 59 y.o. male Date: 09/26/2019 Primary Care Physican: [name], MD Primary Cardiologist:[name] Electrophysiologist:[name] Bi-V Pacing:97.9% 08/05/2019 Weight: 308 lbs  OBSERVATIONS (9)  2 shocks for VT/VF, 1 failed.  TriageHFT (HF Risk) Alert: High (Ongoing)  Possible OptiVol fluid accumulation: 26-Jun-2019 -- ongoing.  RV Capture Management: Actual safety margin (4 X) > programmed margin (2 X).  A. Capture Management: Actual safety margin (3 X) > programmed margin (2 X).  At least 1 therapy failed in 1 VT/VF episodes.  1 treated VT/VF episodes longer than 30 sec.  1 VT episodes accelerated to VF.  VF detection may be delayed: VF Detection Interval is faster than 300 ms (200 bpm).   Transmission Reviewed.  OptivolThoracic impedance trending close to baseline.  Prescribed:Torsemide20 mg 3 tablets (60 mg total) twice a day. Potassium 2 tablets (40 mEq total) twice a day.  Labs: 09/23/2019 Creatinine 1.02, BUN 11, Potassium 4.0, Sodium 140, GFR >60 09/16/2019 Creatinine 1.01, BUN 11, Potassium 4.1, Sodium 140, GFR >60  08/22/2019 Creatinine 1.19, BUN 14, Potassium 4.0, Sodium 141, GFR >60  06/13/2019 Creatinine 1.27, BUN 17, Potassium 4.6, Sodium 140, GFR >60 11/05/2018 Creatinine1.05, BUN15, Potassium4.3, Sodium136, GFR>60 10/29/2018 Creatinine0.94, BUN16, Potassium3.7, Sodium138, GFR>60  10/02/2018 Creatinine0.97, BUN15, Potassium4.4, Sodium140, [id number] A complete set of results can be found in Results Review.  Recommendations:   Message sent to device clinic triage to review shocks and recommendations will be given by device clinic for shock episodes.   Follow-up plan: ICM clinic phone appointment on 10/27/2019.   91 day device clinic remote transmission 09/30/2019.  Office appt 10/16/2019 with Dr. Shirlee Latch.    Copy of ICM check sent to Dr. Graciela Husbands.   3 month  ICM trend: 09/26/2019    1 Year ICM trend:       [name], RN 09/26/2019 9:17 AM

## 2019-09-26 NOTE — Telephone Encounter (Signed)
Spoke with patient. Advised of Dr. Alford Highland recommendations. Pt verbalizes understanding of instructions and is agreeable to increasing amiodarone to 200mg  BID. Scheduled for labs at [facility] office on 09/29/19. Amiodarone refill sent in as 90 day supply per patient request. No additional questions or concerns at this time.   Will await any additional recommendations from Dr. 11/27/19.

## 2019-09-29 ENCOUNTER — Other Ambulatory Visit: Payer: Self-pay

## 2019-09-29 ENCOUNTER — Other Ambulatory Visit: Payer: Medicare Other | Admitting: *Deleted

## 2019-09-29 DIAGNOSIS — I472 Ventricular tachycardia, unspecified: Secondary | ICD-10-CM

## 2019-09-30 ENCOUNTER — Ambulatory Visit (INDEPENDENT_AMBULATORY_CARE_PROVIDER_SITE_OTHER): Payer: Medicare Other | Admitting: *Deleted

## 2019-09-30 DIAGNOSIS — I472 Ventricular tachycardia, unspecified: Secondary | ICD-10-CM

## 2019-09-30 LAB — BASIC METABOLIC PANEL
BUN/Creatinine Ratio: 12 (ref 9–20)
BUN: 13 mg/dL (ref 6–24)
CO2: 28 mmol/L (ref 20–29)
Calcium: 9.2 mg/dL (ref 8.7–10.2)
Chloride: 99 mmol/L (ref 96–106)
Creatinine, Ser: 1.1 mg/dL (ref 0.76–1.27)
GFR calc Af Amer: 86 mL/min/{1.73_m2} (ref >59)
GFR calc non Af Amer: 75 mL/min/{1.73_m2} (ref >59)
Glucose: 97 mg/dL (ref 65–99)
Potassium: 4 mmol/L (ref 3.5–5.2)
Sodium: 140 mmol/L (ref 134–144)

## 2019-09-30 LAB — MAGNESIUM: Magnesium: 2.3 mg/dL (ref 1.6–2.3)

## 2019-09-30 NOTE — Telephone Encounter (Signed)
As an isolated episode VT would not change meds Thanks SK

## 2019-10-01 LAB — CUP PACEART REMOTE DEVICE CHECK
Battery Remaining Longevity: 17 mo
Battery Voltage: 2.9 V
Brady Statistic AP VP Percent: 56.81 %
Brady Statistic AP VS Percent: 0.07 %
Brady Statistic AS VP Percent: 42.9 %
Brady Statistic AS VS Percent: 0.22 %
Brady Statistic RA Percent Paced: 56.16 %
Brady Statistic RV Percent Paced: 98.26 %
Date Time Interrogation Session: 20210113105900
HighPow Impedance: 63 Ohm
Implantable Lead Implant Date: 20160727
Implantable Lead Implant Date: 20160727
Implantable Lead Implant Date: 20160727
Implantable Lead Location: 753858
Implantable Lead Location: 753859
Implantable Lead Location: 753860
Implantable Lead Model: 4598
Implantable Lead Model: 5076
Implantable Lead Model: 6935
Implantable Pulse Generator Implant Date: 20160727
Lead Channel Impedance Value: 304 Ohm
Lead Channel Impedance Value: 342 Ohm
Lead Channel Impedance Value: 342 Ohm
Lead Channel Impedance Value: 399 Ohm
Lead Channel Impedance Value: 399 Ohm
Lead Channel Impedance Value: 418 Ohm
Lead Channel Impedance Value: 456 Ohm
Lead Channel Impedance Value: 513 Ohm
Lead Channel Impedance Value: 532 Ohm
Lead Channel Impedance Value: 532 Ohm
Lead Channel Impedance Value: 589 Ohm
Lead Channel Impedance Value: 608 Ohm
Lead Channel Impedance Value: 646 Ohm
Lead Channel Pacing Threshold Amplitude: 0.625 V
Lead Channel Pacing Threshold Amplitude: 0.625 V
Lead Channel Pacing Threshold Amplitude: 1.5 V
Lead Channel Pacing Threshold Pulse Width: 0.4 ms
Lead Channel Pacing Threshold Pulse Width: 0.4 ms
Lead Channel Pacing Threshold Pulse Width: 0.6 ms
Lead Channel Sensing Intrinsic Amplitude: 1.5 mV
Lead Channel Sensing Intrinsic Amplitude: 1.5 mV
Lead Channel Sensing Intrinsic Amplitude: 13 mV
Lead Channel Sensing Intrinsic Amplitude: 13 mV
Lead Channel Setting Pacing Amplitude: 1.5 V
Lead Channel Setting Pacing Amplitude: 2 V
Lead Channel Setting Pacing Amplitude: 2.5 V
Lead Channel Setting Pacing Pulse Width: 0.4 ms
Lead Channel Setting Pacing Pulse Width: 0.6 ms
Lead Channel Setting Sensing Sensitivity: 0.3 mV

## 2019-10-02 ENCOUNTER — Telehealth (HOSPITAL_COMMUNITY): Payer: Self-pay | Admitting: Pharmacy Technician

## 2019-10-02 LAB — CUP PACEART REMOTE DEVICE CHECK
Battery Remaining Longevity: 17 mo
Battery Voltage: 2.9 V
Brady Statistic AP VP Percent: 43.02 %
Brady Statistic AP VS Percent: 0.1 %
Brady Statistic AS VP Percent: 56.4 %
Brady Statistic AS VS Percent: 0.48 %
Brady Statistic RA Percent Paced: 42.41 %
Brady Statistic RV Percent Paced: 97.62 %
Date Time Interrogation Session: 20210114032305
HighPow Impedance: 52 Ohm
Implantable Lead Implant Date: 20160727
Implantable Lead Implant Date: 20160727
Implantable Lead Implant Date: 20160727
Implantable Lead Location: 753858
Implantable Lead Location: 753859
Implantable Lead Location: 753860
Implantable Lead Model: 4598
Implantable Lead Model: 5076
Implantable Lead Model: 6935
Implantable Pulse Generator Implant Date: 20160727
Lead Channel Impedance Value: 342 Ohm
Lead Channel Impedance Value: 361 Ohm
Lead Channel Impedance Value: 361 Ohm
Lead Channel Impedance Value: 399 Ohm
Lead Channel Impedance Value: 399 Ohm
Lead Channel Impedance Value: 418 Ohm
Lead Channel Impedance Value: 418 Ohm
Lead Channel Impedance Value: 418 Ohm
Lead Channel Impedance Value: 551 Ohm
Lead Channel Impedance Value: 589 Ohm
Lead Channel Impedance Value: 589 Ohm
Lead Channel Impedance Value: 608 Ohm
Lead Channel Impedance Value: 665 Ohm
Lead Channel Pacing Threshold Amplitude: 0.5 V
Lead Channel Pacing Threshold Amplitude: 0.625 V
Lead Channel Pacing Threshold Amplitude: 1.5 V
Lead Channel Pacing Threshold Pulse Width: 0.4 ms
Lead Channel Pacing Threshold Pulse Width: 0.4 ms
Lead Channel Pacing Threshold Pulse Width: 0.6 ms
Lead Channel Sensing Intrinsic Amplitude: 1.5 mV
Lead Channel Sensing Intrinsic Amplitude: 1.5 mV
Lead Channel Sensing Intrinsic Amplitude: 12.25 mV
Lead Channel Sensing Intrinsic Amplitude: 12.25 mV
Lead Channel Setting Pacing Amplitude: 1.5 V
Lead Channel Setting Pacing Amplitude: 2 V
Lead Channel Setting Pacing Amplitude: 2.5 V
Lead Channel Setting Pacing Pulse Width: 0.4 ms
Lead Channel Setting Pacing Pulse Width: 0.6 ms
Lead Channel Setting Sensing Sensitivity: 0.3 mV

## 2019-10-02 NOTE — Telephone Encounter (Signed)
Called and spoke with patient regarding a message CSW sent about his Sherryll Burger. Looks like insurance is covering medications but patient has a high deductible ($445). Patient is having a hard time affording Torsemide.   [employer] stated that the patient can call and request an override for a shipment of Entresto since he is an existing patient. [employer] has until late February to get re-enrollment applications processed and determined.  Was successful in securing patient an $1,000.00 grant from Patient [facility]) to provide copayment coverage for Torsemide.  This will keep the out of pocket expense at $0.     I have spoken with the patient.    The billing information is as follows and has been shared with [facility]. They are getting Torsemide ready for him.          Member ID: [id number] Group ID: [id number] RxBin: [id number] Dates of Eligibility: 10/02/19 through 09/30/20  Fund:  Heart Failure

## 2019-10-03 NOTE — Telephone Encounter (Signed)
OK (but suspect the Marcelline Deist was a coincidence).

## 2019-10-03 NOTE — Telephone Encounter (Signed)
Patient brought in [place] paperwork. Unfortunately because we did not receive it before the end of the year, the out of pocket expense report he attached will not meet this years requirement for the foundation. Called and spoke with patient's pharmacy and let them know they could bill his PAN grant for [place] and Torsemide.   Called and updated patient with this same information. Patient advised me that he was not taking [place]. He feels like the Marcelline Deist is the reason why his pacemaker, "hit him". He had no issues before that and stated the few times he took [place], is when his pacemaker "hit him". I advised him that I would let the provider know that he does not plan to take this medication and he will reach out to him if he feels the need to do so.  Archer Asa, CPhT

## 2019-10-03 NOTE — Telephone Encounter (Signed)
No lets keep him on bid for the month of January and then drop him back, he needs to be seen by [place] ( me or APP) or primary cardiology Irving Burton Thanks SK

## 2019-10-06 NOTE — Telephone Encounter (Signed)
Attempted to reach patient to relay Dr. Odessa Fleming recommendations. No answer, no option to LM. Pt is scheduled for f/u with A. Clark, [place], on 10/21/19.

## 2019-10-07 MED FILL — POTASSIUM CHLORIDE CRYS ER: 20 | 30 days supply | Qty: 120 | Fill #0

## 2019-10-08 ENCOUNTER — Other Ambulatory Visit: Payer: Self-pay

## 2019-10-08 MED ORDER — AMIODARONE HCL 200 MG PO TABS: 200.0000 mg | ORAL_TABLET | Freq: Two times a day (BID) | ORAL | 0 refills | Status: DC

## 2019-10-08 NOTE — Telephone Encounter (Signed)
Spoke with patient to advise of recommendations to decrease amiodarone to 200mg  daily beginning on 10/20/19. Pt verbalizes understanding, aware of upcoming appointments with HF Clinic and A. Tillery, PA. No further questions at this time.  Med list updated.

## 2019-10-15 NOTE — Progress Notes (Signed)
Advanced Heart Failure Clinic Note   Primary Care: Dr. Claris Gower, MD HF Cardiology: Dr. Aundra Dubin  HPI: [name] is a 59 y.o. male with a past medical history of chronic systolic CHF  (EF 16% in April 2018), dilated NICM s/p BiV ICD, PAF with intermittent compliance with Xarelto, ETOH abuse, tobacco abuse, OSA on CPAP, and HTN.   Admitted 12/25/16-12/27/16 with acute on chronic systolic CHF, volume overload. He was diuresed 11 pounds on IV lasix.  Discharge weight was 294 pounds. It was felt that the etiology of his NICM was ETOH vs hypertension related.   Admitted 5/14-5/16/18 with volume overload, acute respiratory failure requiring Bipap. He was not taking his Entresto as he was confused about dosing and there was a mix up his pharmacy. He was diuresed with IV lasix, discharge weight was 295 pounds.   Admitted 05/30/17-06/02/17 with VT, ICD shock x 4. He is now on amiodarone and sotalol.  Echo 09/16/19,  EF 20-25%, severe LV dilation, moderate LVH, normal RV, small IVC.   Says he started farxiga on 12/30 and took the 2nd dose the next day. Says he got shocked the next so he stopped farxiga. Had shock x 2 09/19/19 and 09/25/19. Dr Caryl Comes has been following amio 200 mg twice a day until 10/20/19.   Today he returns for HF follow up. Last visit farxiga was started and torsemide was cut back. Overall feeling fine. Mild shortness of breath with exertion. SOB with steps. Denies PND/Orthopnea. Using CPAP every night. Appetite ok. No fever or chills. Weight at home 312-317 pounds. Taking all medications. Drinking 12 pack on the weekends.   Labs (9/18): K 3.8, creatinine 1.02 Labs (11/18): K 4.4, creatinine 1.19, TSH normal, LFTs normal Labs (11/05/18): K 4 Creatinine 1.05  Labs (12/20): K 4, creatinine 1.19 Labs (09/29/19): K 4 Creatinine 1.1    Medtronic device interrogation:   PMH: 1. Chronic systolic CHF: Medtronic CRT-D.  Nonischemic cardiomyopathy. Possibly ETOH-related.  - LHC (2016): No CAD.  -  Echo (2016): EF 20-25%, diffuse hypokinesis.  - Echo (4/18): EF 15%, normal RV size and systolic function.  - Echo (11/18): EF 20%, mildly dilated LV with mild LVH, normal RV size with mildly decreased RV systolic function.  - Echo (12/20): EF 20-25%, severe LV dilation, moderate LVH, normal RV, small IVC.  2. Atrial fibrillation: Paroxysmal.   3. OSA: On CPAP.  4. ETOH abuse: Prior heavy ETOH.  5. H/o VT 6. COPD: Prior smoker.   ROS: All systems reviewed and negative except as per HPI.   Current Outpatient Medications  Medication Sig Dispense Refill  . acetaminophen (TYLENOL) 325 MG tablet Take 2 tablets (650 mg total) by mouth every 4 (four) hours as needed for headache or mild pain.    Marland Kitchen albuterol (PROVENTIL HFA;VENTOLIN HFA) 108 (90 Base) MCG/ACT inhaler Inhale 1 puff into the lungs every 6 (six) hours as needed for wheezing or shortness of breath. 1 Inhaler 1  . atorvastatin (LIPITOR) 80 MG tablet TAKE 1 TABLET BY MOUTH ONCE DAILY AT  6  PM 90 tablet 3  . FLUoxetine (PROZAC) 20 MG tablet Take 20 mg by mouth daily.    . potassium chloride SA (KLOR-CON) 20 MEQ tablet Take 2 tablets by mouth twice daily 120 tablet 0  . rivaroxaban (XARELTO) 20 MG TABS tablet Take 1 tablet (20 mg total) by mouth daily with supper. 30 tablet 11  . sacubitril-valsartan (ENTRESTO) 49-51 MG Take 1 tablet by mouth 2 (two) times daily.  60 tablet 5  . sotalol (BETAPACE) 80 MG tablet TAKE 1 TABLET BY MOUTH EVERY 12 HOURS 180 tablet 2  . spironolactone (ALDACTONE) 25 MG tablet TAKE 1 TABLET BY MOUTH ONCE DAILY (NEED  OFFICE  VISIT  FOR  FUTURE  REFILLS) 30 tablet 0  . tiZANidine (ZANAFLEX) 2 MG tablet Take 1 tablet (2 mg total) by mouth every 8 (eight) hours as needed for muscle spasms. 40 tablet 0  . torsemide (DEMADEX) 20 MG tablet TAKE 3 TABLETS BY MOUTH TWICE DAILY 180 tablet 0  . amiodarone (PACERONE) 200 MG tablet Take 1 tablet (200 mg total) by mouth 2 (two) times daily. Decrease to 1 tablet (200 mg total) by  mouth once daily beginning 10/20/19. 90 tablet 0  . aspirin EC 325 MG tablet Take 1 tablet (325 mg total) by mouth 2 (two) times daily after a meal. Take x 1 month post op to decrease risk of blood clots. 60 tablet 0  . dapagliflozin propanediol (FARXIGA) 10 MG TABS tablet Take 10 mg by mouth daily before breakfast. (Patient not taking: Reported on 10/16/2019) 30 tablet 11   No current facility-administered medications for this encounter.    No Known Allergies    Social History   Socioeconomic History  . Marital status: Divorced    Spouse name: Not on file  . Number of children: 2  . Years of education: Not on file  . Highest education level: Not on file  Occupational History  . Occupation: Copy  . Occupation: disabled, odd jobs  Tobacco Use  . Smoking status: Former Smoker    Packs/day: 0.50    Years: 4.00    Pack years: 2.00    Types: Cigarettes    Quit date: 10/29/2005    Years since quitting: 13.9  . Smokeless tobacco: Never Used  . Tobacco comment: about a pack a week. social  Substance and Sexual Activity  . Alcohol use: Yes    Alcohol/week: 42.0 standard drinks    Types: 42 Cans of beer per week    Comment: 24 a week  . Drug use: Yes    Frequency: 7.0 times per week    Types: Marijuana    Comment: has discontinued  . Sexual activity: Yes    Partners: Female  Other Topics Concern  . Not on file  Social History Narrative   ** Merged History Encounter **       Social Determinants of Health   Financial Resource Strain:   . Difficulty of Paying Living Expenses: Not on file  Food Insecurity:   . Worried About Programme researcher, broadcasting/film/video in the Last Year: Not on file  . Ran Out of Food in the Last Year: Not on file  Transportation Needs:   . Lack of Transportation (Medical): Not on file  . Lack of Transportation (Non-Medical): Not on file  Physical Activity:   . Days of Exercise per Week: Not on file  . Minutes of Exercise per Session: Not on file  Stress:   .  Feeling of Stress : Not on file  Social Connections:   . Frequency of Communication with Friends and Family: Not on file  . Frequency of Social Gatherings with Friends and Family: Not on file  . Attends Religious Services: Not on file  . Active Member of Clubs or Organizations: Not on file  . Attends Banker Meetings: Not on file  . Marital Status: Not on file  Intimate Partner Violence:   .  Fear of Current or Ex-Partner: Not on file  . Emotionally Abused: Not on file  . Physically Abused: Not on file  . Sexually Abused: Not on file      Family History  Problem Relation Age of Onset  . Other Mother        cardiac surgery. late 1990s  . Heart Problems Mother        CABG AGE 47  . Congestive Heart Failure Father   . Healthy Father        AGE 76  . Coronary artery disease Other   . Healthy Brother        AGE 70  . Healthy Sister        AGE 37  . Healthy Sister        AGE 63  . Healthy Son   . Healthy Son   . Healthy Daughter     Vitals:   10/16/19 1138  BP: 103/68  Pulse: 80  SpO2: 97%  Weight: (!) 142 kg (313 lb)   Wt Readings from Last 3 Encounters:  10/16/19 (!) 142 kg (313 lb)  09/16/19 (!) 143.3 kg (316 lb)  06/13/19 (!) 140.2 kg (309 lb)     PHYSICAL EXAM: General:  Well appearing. No resp difficulty HEENT: normal Neck: supple. no JVD. Carotids 2+ bilat; no bruits. No lymphadenopathy or thryomegaly appreciated. Cor: PMI nondisplaced. Regular rate & rhythm. No rubs, gallops or murmurs. Lungs: clear Abdomen: soft, nontender, nondistended. No hepatosplenomegaly. No bruits or masses. Good bowel sounds. Extremities: no cyanosis, clubbing, rash, edema Neuro: alert & orientedx3, cranial nerves grossly intact. moves all 4 extremities w/o difficulty. Affect pleasant   ASSESSMENT & PLAN: 1. Chronic systolic CHF: NICM, Echo 12/2016 EF 15%. Normal coronaries on cath in July 2016. Medtronic CRT-D device. ETOH vs HTN vs myocarditis as cause of  cardiomyopathy. Echo today was reviewed, EF remains 20-25%.  - NYHA III. -He stopped farixga 1/2 due to ICD shocks earlier this month. Doubt that was the cause but he does not want to re-challenge.  -Volume status stable. Continue  torsemide to 60 qam/60 qpm.    - Continue Entresto  49-51 mg twice a day. - Continue spironolactone 25 mg daily - Intolerant to Coreg in the past with hypotension, he is on sotalol and amiodarone.  - He needs to cut back on ETOH as he is still drinking fairly heavily.  - QRS is wide despite BiV pacing, will ask EP to take a look at his ECG to see if this needs to be addressed. He has follow up on 10/20/19  2. Paroxysmal atrial fibrillation:  - Continue amiodarone 200 mg daily.    - Plan to follow amiodarone screening per guidelines  -Check TSH, Free T3 Free T4 and eye exams at least yearly.  - Set up CXR next visit.  -  Continue Xarelto 20 mg daily.   3. ETOH abuse: Suspect this contributes to cardiomyopathy.   Discussed alcohol cessation.  4. Tobacco abuse: No longer smoking.   5. VT: Had VT 09/29/19 Continue amio 200 mg twice a day until 10/20/19. He has follow up with EP.  6. OSA: Continue nightly CPAP.   Follow up in 8 weeks.   Tonye Becket, NP 10/16/19

## 2019-10-16 ENCOUNTER — Ambulatory Visit (HOSPITAL_COMMUNITY)
Admission: RE | Admit: 2019-10-16 | Discharge: 2019-10-16 | Disposition: A | Discharge status: Home / Self Care | Payer: Medicare Other | Source: Ambulatory Visit | Attending: Adult Health | Admitting: Adult Health

## 2019-10-16 ENCOUNTER — Other Ambulatory Visit: Payer: Self-pay

## 2019-10-16 ENCOUNTER — Encounter (HOSPITAL_COMMUNITY): Payer: Self-pay

## 2019-10-16 ENCOUNTER — Telehealth (HOSPITAL_COMMUNITY): Payer: Self-pay | Admitting: Licensed Clinical Social Worker

## 2019-10-16 VITALS — BP 103/68 | HR 80 | Wt 313.0 lb

## 2019-10-16 DIAGNOSIS — R0602 Shortness of breath: Secondary | ICD-10-CM | POA: Insufficient documentation

## 2019-10-16 DIAGNOSIS — I472 Ventricular tachycardia, unspecified: Secondary | ICD-10-CM

## 2019-10-16 DIAGNOSIS — I110 Hypertensive heart disease with heart failure: Secondary | ICD-10-CM | POA: Insufficient documentation

## 2019-10-16 DIAGNOSIS — Z87891 Personal history of nicotine dependence: Secondary | ICD-10-CM | POA: Diagnosis not present

## 2019-10-16 DIAGNOSIS — Z7901 Long term (current) use of anticoagulants: Secondary | ICD-10-CM | POA: Insufficient documentation

## 2019-10-16 DIAGNOSIS — Z7982 Long term (current) use of aspirin: Secondary | ICD-10-CM | POA: Diagnosis not present

## 2019-10-16 DIAGNOSIS — G4733 Obstructive sleep apnea (adult) (pediatric): Secondary | ICD-10-CM | POA: Insufficient documentation

## 2019-10-16 DIAGNOSIS — I480 Paroxysmal atrial fibrillation: Secondary | ICD-10-CM | POA: Diagnosis not present

## 2019-10-16 DIAGNOSIS — Z7984 Long term (current) use of oral hypoglycemic drugs: Secondary | ICD-10-CM | POA: Diagnosis not present

## 2019-10-16 DIAGNOSIS — I5022 Chronic systolic (congestive) heart failure: Secondary | ICD-10-CM | POA: Insufficient documentation

## 2019-10-16 DIAGNOSIS — Z79899 Other long term (current) drug therapy: Secondary | ICD-10-CM | POA: Insufficient documentation

## 2019-10-16 DIAGNOSIS — I428 Other cardiomyopathies: Secondary | ICD-10-CM | POA: Diagnosis not present

## 2019-10-16 DIAGNOSIS — J449 Chronic obstructive pulmonary disease, unspecified: Secondary | ICD-10-CM | POA: Insufficient documentation

## 2019-10-16 DIAGNOSIS — Z8249 Family history of ischemic heart disease and other diseases of the circulatory system: Secondary | ICD-10-CM | POA: Diagnosis not present

## 2019-10-16 DIAGNOSIS — F1010 Alcohol abuse, uncomplicated: Secondary | ICD-10-CM | POA: Diagnosis not present

## 2019-10-16 MED ORDER — TORSEMIDE 20 MG PO TABS: 60.0000 mg | ORAL_TABLET | Freq: Two times a day (BID) | ORAL | 0 refills | Status: DC

## 2019-10-16 MED ORDER — POTASSIUM CHLORIDE CRYS ER 20 MEQ PO TBCR: 40.0000 meq | EXTENDED_RELEASE_TABLET | Freq: Two times a day (BID) | ORAL | 0 refills | Status: DC

## 2019-10-16 MED ORDER — SPIRONOLACTONE 25 MG PO TABS: ORAL_TABLET | ORAL | 0 refills | Status: DC

## 2019-10-16 NOTE — Progress Notes (Signed)
CSW met with pt during clinic visit to complete Extra Help application to try and help with his medication costs.  CSW assisted in completing application- pt should get notice by mail within a month regarding his eligibility.  CSW will continue to follow and assist as needed  [name] [name], [facility] Desk#: [phone] Cell#: [phone]

## 2019-10-16 NOTE — Patient Instructions (Signed)
REFILLED: Torsemide, Spironolactone, Potassium  Please follow up with the Advanced [facility] Failure Clinic in 3 months.  At the [facility] Clinic, you and your health needs are our priority. As part of our continuing mission to provide you with exceptional heart care, we have created designated Provider Care Teams. These Care Teams include your primary Cardiologist (physician) and Advanced Practice Providers (APPs- Physician Assistants and Nurse Practitioners) who all work together to provide you with the care you need, when you need it.   You may see any of the following providers on your designated Care Team at your next follow up: Marland Kitchen Dr Arvilla Meres . Dr Marca Ancona . Tonye Becket, NP . Robbie Lis, PA . Karle Plumber, PharmD   Please be sure to bring in all your medications bottles to every appointment.

## 2019-10-16 NOTE — Telephone Encounter (Signed)
Entered in error

## 2019-10-20 NOTE — Progress Notes (Signed)
Electrophysiology Office Note Date: 10/21/2019  ID:  [name] [name], DOB [birth year], MRN [id number]  PCP: [name], MD Primary Cardiologist: Dr. Shirlee Latch  Electrophysiologist: Dr. Graciela Husbands   CC: Routine ICD follow-up  [name] [name] is a 59 y.o. male seen today for Dr. Graciela Husbands.  They present today for routine electrophysiology followup.  Since last being seen in our clinic, the patient reports doing well since his last ICD shock and meds were adjusted. He went to amiodarone once daily today. They deny chest pain, palpitations, dyspnea, PND, orthopnea, nausea, vomiting, dizziness, syncope, edema, weight gain, or early satiety.  As previously noted, He had ICD shocks on 09/22/19. ATP x 3 that accelerated VT, transitioned to VF after first 35J shock, VF terminated with second 35J shock. 1 NSVT episode on 09/24/19, 21 beats at 217 bpm.  Device History: Medtronic BiV ICD implanted 2016 for CHF History of appropriate therapy: Yes History of AAD therapy: Yes   Past Medical History:  Diagnosis Date  . AICD (automatic cardioverter/defibrillator) present   . Arthritis   . CAD (coronary artery disease)    Mild nonobstructive (Cath 09)  . Chronic systolic heart failure (HCC)    Nonischemic CM: echo 4/12 with EF 25-30%, grade 2 diast dysfxn, mild dilated aortic root 43 mm, trivial MR, mod LAE  . COPD (chronic obstructive pulmonary disease) (HCC)   . Dyspnea   . H/O ETOH abuse   . HTN (hypertension)   . Hypersomnia   . Obesity, morbid (HCC)   . OSA on CPAP   . Systolic and diastolic CHF, acute on chronic (HCC) 11/2016  . Tobacco use disorder   . Ventricular tachycardia [facility])    Past Surgical History:  Procedure Laterality Date  . CARDIAC CATHETERIZATION N/A 04/08/2015   Procedure: Right/Left Heart Cath and Coronary Angiography;  Surgeon: Corky Crafts, MD;  [facility] CV LAB;  Service: Cardiovascular;  Laterality: N/A;  . EP IMPLANTABLE DEVICE N/A 04/14/2015   Procedure: BiV  ICD Insertion CRT-D;  Surgeon: [name], MD;  Location: [facility] INVASIVE CV LAB;  Service: Cardiovascular;  Laterality: N/A;  . HYDROCELE EXCISION / REPAIR    . TOTAL HIP ARTHROPLASTY Right 11/04/2018   Procedure: TOTAL HIP ARTHROPLASTY ANTERIOR APPROACH;  Surgeon: [name], MD;  Location: [facility] ORS;  Service: Orthopedics;  Laterality: Right;  . TUMOR EXCISION Left   . 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 VASECTOMY      Current Outpatient Medications  Medication Sig Dispense Refill  . acetaminophen (TYLENOL) 325 MG tablet Take 2 tablets (650 mg total) by mouth every 4 (four) hours as needed for headache or mild pain.    Marland Kitchen albuterol (PROVENTIL HFA;VENTOLIN HFA) 108 (90 Base) MCG/ACT inhaler Inhale 1 puff into the lungs every 6 (six) hours as needed for wheezing or shortness of breath. 1 Inhaler 1  . amiodarone (PACERONE) 200 MG tablet Take 1 tablet (200 mg total) by mouth 2 (two) times daily. Decrease to 1 tablet (200 mg total) by mouth once daily beginning 10/20/19. 90 tablet 0  . aspirin EC 325 MG tablet Take 1 tablet (325 mg total) by mouth 2 (two) times daily after a meal. Take x 1 month post op to decrease risk of blood clots. 60 tablet 0  . atorvastatin (LIPITOR) 80 MG tablet TAKE 1 TABLET BY MOUTH ONCE DAILY AT  6  PM 90 tablet 3  . dapagliflozin propanediol (FARXIGA) 10 MG TABS  tablet Take 10 mg by mouth daily before breakfast. 30 tablet 11  . FLUoxetine (PROZAC) 20 MG tablet Take 20 mg by mouth daily.    . potassium chloride SA (KLOR-CON) 20 MEQ tablet Take 2 tablets (40 mEq total) by mouth 2 (two) times daily. 120 tablet 0  . rivaroxaban (XARELTO) 20 MG TABS tablet Take 1 tablet (20 mg total) by mouth daily with supper. 30 tablet 11  . sacubitril-valsartan (ENTRESTO) 49-51 MG Take 1 tablet by mouth 2 (two) times daily. 60 tablet 5  . sotalol (BETAPACE) 80 MG tablet TAKE 1 TABLET BY MOUTH EVERY 12 HOURS 180 tablet 2  . spironolactone (ALDACTONE) 25 MG tablet  TAKE 1 TABLET BY MOUTH ONCE DAILY (NEED  OFFICE  VISIT  FOR  FUTURE  REFILLS) 30 tablet 0  . tiZANidine (ZANAFLEX) 2 MG tablet Take 1 tablet (2 mg total) by mouth every 8 (eight) hours as needed for muscle spasms. 40 tablet 0  . torsemide (DEMADEX) 20 MG tablet Take 3 tablets (60 mg total) by mouth 2 (two) times daily. 180 tablet 0   No current facility-administered medications for this visit.    Allergies:   Patient has no known allergies.   Social History: Social History   Socioeconomic History  . Marital status: Divorced    Spouse name: Not on file  . Number of children: 2  . Years of education: Not on file  . Highest education level: Not on file  Occupational History  . Occupation: Air traffic controller  . Occupation: disabled, odd jobs  Tobacco Use  . Smoking status: Former Smoker    Packs/day: 0.50    Years: 4.00    Pack years: 2.00    Types: Cigarettes    Quit date: 10/29/2005    Years since quitting: 13.9  . Smokeless tobacco: Never Used  . Tobacco comment: about a pack a week. social  Substance and Sexual Activity  . Alcohol use: Yes    Alcohol/week: 42.0 standard drinks    Types: 42 Cans of beer per week    Comment: 24 a week  . Drug use: Yes    Frequency: 7.0 times per week    Types: Marijuana    Comment: has discontinued  . Sexual activity: Yes    Partners: Female  Other Topics Concern  . Not on file  Social History Narrative   ** Merged History Encounter **       Social Determinants of Health   Financial Resource Strain:   . Difficulty of Paying Living Expenses: Not on file  Food Insecurity:   . Worried About Charity fundraiser in the Last Year: Not on file  . Ran Out of Food in the Last Year: Not on file  Transportation Needs:   . Lack of Transportation (Medical): Not on file  . Lack of Transportation (Non-Medical): Not on file  Physical Activity:   . Days of Exercise per Week: Not on file  . Minutes of Exercise per Session: Not on file  Stress:   .  Feeling of Stress : Not on file  Social Connections:   . Frequency of Communication with Friends and Family: Not on file  . Frequency of Social Gatherings with Friends and Family: Not on file  . Attends Religious Services: Not on file  . Active Member of Clubs or Organizations: Not on file  . Attends Archivist Meetings: Not on file  . Marital Status: Not on file  Intimate Partner Violence:   .  Fear of Current or Ex-Partner: Not on file  . Emotionally Abused: Not on file  . Physically Abused: Not on file  . Sexually Abused: Not on file    Family History: Family History  Problem Relation Age of Onset  . Other Mother        cardiac surgery. late 1990s  . Heart Problems Mother        CABG AGE 91  . Congestive Heart Failure Father   . Healthy Father        AGE 1  . Coronary artery disease Other   . Healthy Brother        AGE 88  . Healthy Sister        AGE 13  . Healthy Sister        AGE 30  . Healthy Son   . Healthy Son   . Healthy Daughter     Review of Systems: All other systems reviewed and are otherwise negative except as noted above.   Physical Exam: Vitals:   10/21/19 1144  BP: (!) 98/52  Pulse: 84  SpO2: 96%  Weight: (!) 315 lb 12.8 oz (143.2 kg)  Height: 6\' 4"  (1.93 m)     GEN- The patient is well appearing, alert and oriented x 3 today.   HEENT: normocephalic, atraumatic; sclera clear, conjunctiva pink; hearing intact; oropharynx clear; neck supple, no JVP Lymph- no cervical lymphadenopathy Lungs- Clear to ausculation bilaterally, normal work of breathing.  No wheezes, rales, rhonchi Heart- Regular rate and rhythm, no murmurs, rubs or gallops, PMI not laterally displaced GI- soft, non-tender, non-distended, bowel sounds present, no hepatosplenomegaly Extremities- no clubbing, cyanosis, or edema; DP/PT/radial pulses 2+ bilaterally MS- no significant deformity or atrophy Skin- warm and dry, no rash or lesion; ICD pocket well healed Psych-  euthymic mood, full affect Neuro- strength and sensation are intact  ICD interrogation- reviewed in detail today,  See PACEART report  EKG:  EKG is not ordered today. Reviewed previous EKG and office notes.  We have attempted several times to optimize his CRT, and have been unable to narrow his QRS.   Recent Labs: 09/16/2019: ALT 18; Hemoglobin 11.8; Platelets 206; TSH 1.337 09/29/2019: BUN 13; Creatinine, Ser 1.10; Magnesium 2.3; Potassium 4.0; Sodium 140   Wt Readings from Last 3 Encounters:  10/21/19 (!) 315 lb 12.8 oz (143.2 kg)  10/16/19 (!) 313 lb (142 kg)  09/16/19 (!) 316 lb (143.3 kg)     Other studies Reviewed: Additional studies/ records that were reviewed today include: Previous remote and in office checks, ICD op note, previous EKGs, previous   Assessment and Plan:  1.  Chronic systolic dysfunction s/p Medtronic CRT-D  euvolemic today Stable on an appropriate medical regimen Normal ICD function See Pace Art report Reviewed notes and EKGs, we have tried multiple times to narrow his QRS and have been unable to do so. Most recent optimization accomplished a negative QRS in lead 1, and a biphasic RS complex in lead V1, which he maintains currently. He is set LV -> RV by 50 seconds.  Please note on scanned report; LV threshold test ran at higher output and not "reset" to normal results. Threshold stable.  No changes today  2. VT No further since shock 1/4 Decrease amiodarone to 200 mg daily as planned.  Recent labwork stable.  Continue sotalol. Most recent QTc stable.   3. PAF Low burden by device Continue Xarelto for CHA2DS2VASC of at least 3.    Current medicines are  reviewed at length with the patient today.   The patient does not have concerns regarding his medicines.  The following changes were made today:  Decrease amiodarone to 200 mg daily as planned.   Labs/ tests ordered today include:  Orders Placed This Encounter  Procedures  . CUP PACEART INCLINIC  DEVICE CHECK   Disposition:   Follow up with Dr. Graciela Husbands in 6 months. Sooner as needed.   Dustin Flock, PA-C  10/21/2019 2:43 PM  [facility] [address] [place] [address] [phone] (office) [phone] (fax)

## 2019-10-21 ENCOUNTER — Other Ambulatory Visit: Payer: Self-pay

## 2019-10-21 ENCOUNTER — Ambulatory Visit (INDEPENDENT_AMBULATORY_CARE_PROVIDER_SITE_OTHER): Payer: Medicare Other | Admitting: Student

## 2019-10-21 ENCOUNTER — Encounter: Payer: Self-pay | Admitting: Student

## 2019-10-21 VITALS — BP 98/52 | HR 84 | Ht 76.0 in | Wt 315.8 lb

## 2019-10-21 DIAGNOSIS — I5022 Chronic systolic (congestive) heart failure: Secondary | ICD-10-CM

## 2019-10-21 DIAGNOSIS — I480 Paroxysmal atrial fibrillation: Secondary | ICD-10-CM

## 2019-10-21 DIAGNOSIS — I472 Ventricular tachycardia, unspecified: Secondary | ICD-10-CM

## 2019-10-21 DIAGNOSIS — I428 Other cardiomyopathies: Secondary | ICD-10-CM | POA: Diagnosis not present

## 2019-10-21 LAB — CUP PACEART INCLINIC DEVICE CHECK
Battery Remaining Longevity: 16 mo
Battery Voltage: 2.92 V
Brady Statistic AP VP Percent: 63.14 %
Brady Statistic AP VS Percent: 0.13 %
Brady Statistic AS VP Percent: 36.25 %
Brady Statistic AS VS Percent: 0.48 %
Brady Statistic RA Percent Paced: 62.45 %
Brady Statistic RV Percent Paced: 98 %
Date Time Interrogation Session: 20210202143844
HighPow Impedance: 65 Ohm
Implantable Lead Implant Date: 20160727
Implantable Lead Implant Date: 20160727
Implantable Lead Implant Date: 20160727
Implantable Lead Location: 753858
Implantable Lead Location: 753859
Implantable Lead Location: 753860
Implantable Lead Model: 4598
Implantable Lead Model: 5076
Implantable Lead Model: 6935
Implantable Pulse Generator Implant Date: 20160727
Lead Channel Impedance Value: 361 Ohm
Lead Channel Impedance Value: 361 Ohm
Lead Channel Impedance Value: 361 Ohm
Lead Channel Impedance Value: 399 Ohm
Lead Channel Impedance Value: 418 Ohm
Lead Channel Impedance Value: 418 Ohm
Lead Channel Impedance Value: 456 Ohm
Lead Channel Impedance Value: 513 Ohm
Lead Channel Impedance Value: 589 Ohm
Lead Channel Impedance Value: 589 Ohm
Lead Channel Impedance Value: 608 Ohm
Lead Channel Impedance Value: 646 Ohm
Lead Channel Impedance Value: 646 Ohm
Lead Channel Pacing Threshold Amplitude: 0.5 V
Lead Channel Pacing Threshold Amplitude: 0.5 V
Lead Channel Pacing Threshold Amplitude: 1.5 V
Lead Channel Pacing Threshold Pulse Width: 0.4 ms
Lead Channel Pacing Threshold Pulse Width: 0.4 ms
Lead Channel Pacing Threshold Pulse Width: 0.6 ms
Lead Channel Sensing Intrinsic Amplitude: 1.25 mV
Lead Channel Sensing Intrinsic Amplitude: 12.25 mV
Lead Channel Sensing Intrinsic Amplitude: 12.875 mV
Lead Channel Sensing Intrinsic Amplitude: 4.25 mV
Lead Channel Setting Pacing Amplitude: 1.5 V
Lead Channel Setting Pacing Amplitude: 2 V
Lead Channel Setting Pacing Amplitude: 2.5 V
Lead Channel Setting Pacing Pulse Width: 0.4 ms
Lead Channel Setting Pacing Pulse Width: 0.6 ms
Lead Channel Setting Sensing Sensitivity: 0.3 mV

## 2019-10-21 MED FILL — ATORVASTATIN 80 MG TABLET: 80 | 90 days supply | Qty: 90 | Fill #0

## 2019-10-21 MED FILL — XARELTO 20 MG TABLET: 20 | 30 days supply | Qty: 30 | Fill #0

## 2019-10-21 NOTE — Patient Instructions (Signed)
Medication Instructions:  none *If you need a refill on your cardiac medications before your next appointment, please call your pharmacy*  Lab Work: none If you have labs (blood work) drawn today and your tests are completely normal, you will receive your results only by: Marland Kitchen MyChart Message (if you have MyChart) OR . A paper copy in the mail If you have any lab test that is abnormal or we need to change your treatment, we will call you to review the results.  Testing/Procedures: none  Follow-Up: At [facility], you and your health needs are our priority.  As part of our continuing mission to provide you with exceptional heart care, we have created designated Provider Care Teams.  These Care Teams include your primary Cardiologist (physician) and Advanced Practice Providers (APPs -  Physician Assistants and Nurse Practitioners) who all work together to provide you with the care you need, when you need it.  Your next appointment:   6 month(s)  The format for your next appointment:   Either In Person or Virtual  Provider:   Dr Graciela Husbands  Other Instructions Remote monitoring is used to monitor your  ICD from home. This monitoring reduces the number of office visits required to check your device to one time per year. It allows [place] to keep an eye on the functioning of your device to ensure it is working properly. You are scheduled for a device check from home on 12/30/19. You may send your transmission at any time that day. If you have a wireless device, the transmission will be sent automatically. After your physician reviews your transmission, you will receive a postcard with your next transmission date.

## 2019-10-23 ENCOUNTER — Telehealth: Payer: Self-pay | Admitting: Emergency Medicine

## 2019-10-23 DIAGNOSIS — I472 Ventricular tachycardia, unspecified: Secondary | ICD-10-CM

## 2019-10-23 NOTE — Telephone Encounter (Signed)
Quick literature search and cannot find that dapagliflozin causes ventricular tachycardia We will see

## 2019-10-23 NOTE — Telephone Encounter (Signed)
noted 

## 2019-10-23 NOTE — Telephone Encounter (Signed)
Sounds like he needs to just stop dapagliflozin.  Would have him get BMET and Mg.

## 2019-10-23 NOTE — Telephone Encounter (Signed)
Received alert that  10/22/19 between 2033 and 2034 the  patient had 2 episodes of NSVT and 1 episode of VT treated with ATP x 3 unsuccessfully and received a 35 J shock that successfully terminated the episode. Patient is asymptomatic today. Reported dizziness prior to shock from ICD. Reports that he resumed Farixga 10 mg dose 10/22/19 in the morning and received the shock that night. He reports a similar episode 09/19/19 when he took [place] and received shock for VT that evening. He had discontinued the medication until 10/22/19 and now reports he is unwilling to resume Farixga due to having the second episode of VT.  Patient had tapered amiodorone dose to 200 mg daily starting 10/22/19 per previous orders and was seen in clinic by [facility] 10/22/19. [place] DMV driving restrictions reviewed.

## 2019-10-24 NOTE — Telephone Encounter (Signed)
LMOM to call office.Need to arrange for BMET and magnesium to be drawn.

## 2019-10-25 MED FILL — TORSEMIDE 20 MG TABLET: 20 | 30 days supply | Qty: 180 | Fill #0

## 2019-10-27 ENCOUNTER — Ambulatory Visit (INDEPENDENT_AMBULATORY_CARE_PROVIDER_SITE_OTHER): Payer: Medicare Other

## 2019-10-27 ENCOUNTER — Telehealth (HOSPITAL_COMMUNITY): Payer: Self-pay | Admitting: Pharmacist

## 2019-10-27 DIAGNOSIS — Z95810 Presence of automatic (implantable) cardiac defibrillator: Secondary | ICD-10-CM | POA: Diagnosis not present

## 2019-10-27 DIAGNOSIS — I5022 Chronic systolic (congestive) heart failure: Secondary | ICD-10-CM | POA: Diagnosis not present

## 2019-10-27 NOTE — Telephone Encounter (Signed)
Provided patient with Xarelto samples.  Medication: Xarelto 20 mg Quantity: 5 bottles (35 days)  Lot: [id number] Expiration date: 11/21  Dropped samples off at front desk for patient along with patient portion of Xarelto patient assistance application.   Karle Plumber, PharmD, BCPS, CPP Heart Failure Clinic Pharmacist [phone]

## 2019-10-28 NOTE — Progress Notes (Signed)
EPIC Encounter for ICM Monitoring  Patient Name: [name] [name] is a 59 y.o. male Date: 10/28/2019 Primary Care Physican: [name], MD Primary Cardiologist:[name] Electrophysiologist:[name] Bi-V Pacing:96.9% 10/28/2019 Weight: 312lbs   Spoke with patient and he is doing well.    OptivolThoracic impedance normal.  Prescribed:Torsemide20 mg 3 tablets (60 mg total) twice a day. Potassium 2 tablets (40 mEq total) twice a day.  Labs: 09/29/2019 Creatinine 1.10, BUN 13, Potassium 4.0, Sodium 140, GFR 75-86 09/23/2019 Creatinine 1.02, BUN 11, Potassium 4.0, Sodium 140, GFR >60 09/16/2019 Creatinine 1.01, BUN 11, Potassium 4.1, Sodium 140, GFR >60  08/22/2019 Creatinine 1.19, BUN 14, Potassium 4.0, Sodium 141, GFR >60  A complete set of results can be found in Results Review.  Recommendations:   No changes and encouraged to call if experiencing any fluid symptoms.  Follow-up plan: ICM clinic phone appointment on 12/01/2019.   91 day device clinic remote transmission 12/30/2019.      Copy of ICM check sent to Dr. Graciela Husbands.   3 month ICM trend: 10/27/2019    1 Year ICM trend:       [name], RN 10/28/2019 4:06 PM

## 2019-10-28 NOTE — Telephone Encounter (Signed)
Patient advised and verbalized understanding. Lab order placed,lab appt scheduled

## 2019-10-28 NOTE — Telephone Encounter (Signed)
Orders Placed This Encounter  Procedures  . Basic Metabolic Panel (BMET)    Standing Status:   Future    Standing Expiration Date:   10/27/2020    Order Specific Question:   Release to patient    Answer:   Immediate  . Magnesium    Standing Status:   Future    Standing Expiration Date:   10/27/2020    Order Specific Question:   Release to patient    Answer:   Immediate

## 2019-10-29 ENCOUNTER — Other Ambulatory Visit: Payer: Self-pay

## 2019-10-29 ENCOUNTER — Ambulatory Visit (HOSPITAL_COMMUNITY)
Admission: RE | Admit: 2019-10-29 | Discharge: 2019-10-29 | Disposition: A | Discharge status: Home / Self Care | Payer: Medicare Other | Source: Ambulatory Visit | Attending: Internal Medicine | Admitting: Internal Medicine

## 2019-10-29 DIAGNOSIS — I472 Ventricular tachycardia, unspecified: Secondary | ICD-10-CM

## 2019-10-29 LAB — BASIC METABOLIC PANEL
Anion gap: 9 (ref 5–15)
BUN: 15 mg/dL (ref 6–20)
CO2: 30 mmol/L (ref 22–32)
Calcium: 9.3 mg/dL (ref 8.9–10.3)
Chloride: 102 mmol/L (ref 98–111)
Creatinine, Ser: 1.35 mg/dL — ABNORMAL HIGH (ref 0.61–1.24)
GFR calc Af Amer: >60 mL/min (ref >60)
GFR calc non Af Amer: 58 mL/min — ABNORMAL LOW (ref >60)
Glucose, Bld: 119 mg/dL — ABNORMAL HIGH (ref 70–99)
Potassium: 4 mmol/L (ref 3.5–5.1)
Sodium: 141 mmol/L (ref 135–145)

## 2019-10-29 LAB — MAGNESIUM: Magnesium: 2.2 mg/dL (ref 1.7–2.4)

## 2019-10-30 ENCOUNTER — Telehealth (HOSPITAL_COMMUNITY): Payer: Self-pay | Admitting: Pharmacist

## 2019-10-30 NOTE — Telephone Encounter (Signed)
Sent in [employer] Assistance application to [employer] for [employer].   Fax: [phone]  Application pending, will continue to follow.  Karle Plumber, PharmD, BCPS, BCCP, CPP Heart Failure Clinic Pharmacist [phone]

## 2019-11-07 ENCOUNTER — Telehealth (HOSPITAL_COMMUNITY): Payer: Self-pay | Admitting: Pharmacy Technician

## 2019-11-07 NOTE — Telephone Encounter (Signed)
Advanced Heart Failure Patient Advocate Encounter   Patient was approved to receive Entresto from [employer].  Effective dates: 11/07/19 through 09/17/20  Still waiting to hear back about Xarelto. Called and patient is aware of approval. He has phone number to [employer] to reach out about refills.  Archer Asa, CPhT

## 2019-11-13 MED FILL — POTASSIUM CHLORIDE CRYS ER: 20 | 30 days supply | Qty: 120 | Fill #0

## 2019-11-19 ENCOUNTER — Other Ambulatory Visit: Payer: Self-pay

## 2019-11-19 ENCOUNTER — Emergency Department (HOSPITAL_COMMUNITY): Payer: Medicare Other

## 2019-11-19 ENCOUNTER — Inpatient Hospital Stay (HOSPITAL_COMMUNITY)
Admission: EM | Admit: 2019-11-19 | Discharge: 2019-11-23 | DRG: 308 | Disposition: A | Discharge status: Home / Self Care | Payer: Medicare Other | Attending: Cardiology | Admitting: Cardiology

## 2019-11-19 ENCOUNTER — Telehealth: Payer: Self-pay

## 2019-11-19 ENCOUNTER — Other Ambulatory Visit (HOSPITAL_COMMUNITY): Payer: Self-pay | Admitting: Adult Health

## 2019-11-19 ENCOUNTER — Encounter (HOSPITAL_COMMUNITY): Payer: Self-pay

## 2019-11-19 ENCOUNTER — Other Ambulatory Visit (HOSPITAL_COMMUNITY): Payer: Self-pay

## 2019-11-19 DIAGNOSIS — E876 Hypokalemia: Secondary | ICD-10-CM | POA: Diagnosis present

## 2019-11-19 DIAGNOSIS — Z6837 Body mass index (BMI) 37.0-37.9, adult: Secondary | ICD-10-CM

## 2019-11-19 DIAGNOSIS — Z87891 Personal history of nicotine dependence: Secondary | ICD-10-CM | POA: Diagnosis not present

## 2019-11-19 DIAGNOSIS — I313 Pericardial effusion (noninflammatory): Secondary | ICD-10-CM | POA: Diagnosis present

## 2019-11-19 DIAGNOSIS — Z9852 Vasectomy status: Secondary | ICD-10-CM | POA: Diagnosis not present

## 2019-11-19 DIAGNOSIS — J449 Chronic obstructive pulmonary disease, unspecified: Secondary | ICD-10-CM | POA: Diagnosis present

## 2019-11-19 DIAGNOSIS — Z4502 Encounter for adjustment and management of automatic implantable cardiac defibrillator: Secondary | ICD-10-CM

## 2019-11-19 DIAGNOSIS — Z23 Encounter for immunization: Secondary | ICD-10-CM

## 2019-11-19 DIAGNOSIS — F1010 Alcohol abuse, uncomplicated: Secondary | ICD-10-CM | POA: Diagnosis present

## 2019-11-19 DIAGNOSIS — Z96641 Presence of right artificial hip joint: Secondary | ICD-10-CM | POA: Diagnosis present

## 2019-11-19 DIAGNOSIS — E669 Obesity, unspecified: Secondary | ICD-10-CM | POA: Diagnosis present

## 2019-11-19 DIAGNOSIS — I4891 Unspecified atrial fibrillation: Secondary | ICD-10-CM | POA: Diagnosis present

## 2019-11-19 DIAGNOSIS — Z95810 Presence of automatic (implantable) cardiac defibrillator: Secondary | ICD-10-CM | POA: Diagnosis not present

## 2019-11-19 DIAGNOSIS — I420 Dilated cardiomyopathy: Secondary | ICD-10-CM | POA: Diagnosis present

## 2019-11-19 DIAGNOSIS — I480 Paroxysmal atrial fibrillation: Secondary | ICD-10-CM | POA: Diagnosis not present

## 2019-11-19 DIAGNOSIS — Z91120 Patient's intentional underdosing of medication regimen due to financial hardship: Secondary | ICD-10-CM

## 2019-11-19 DIAGNOSIS — R531 Weakness: Secondary | ICD-10-CM | POA: Diagnosis not present

## 2019-11-19 DIAGNOSIS — I110 Hypertensive heart disease with heart failure: Secondary | ICD-10-CM | POA: Diagnosis not present

## 2019-11-19 DIAGNOSIS — Z7901 Long term (current) use of anticoagulants: Secondary | ICD-10-CM

## 2019-11-19 DIAGNOSIS — Z7982 Long term (current) use of aspirin: Secondary | ICD-10-CM

## 2019-11-19 DIAGNOSIS — I447 Left bundle-branch block, unspecified: Secondary | ICD-10-CM | POA: Diagnosis not present

## 2019-11-19 DIAGNOSIS — G4733 Obstructive sleep apnea (adult) (pediatric): Secondary | ICD-10-CM | POA: Diagnosis present

## 2019-11-19 DIAGNOSIS — I2721 Secondary pulmonary arterial hypertension: Secondary | ICD-10-CM | POA: Diagnosis not present

## 2019-11-19 DIAGNOSIS — I428 Other cardiomyopathies: Secondary | ICD-10-CM

## 2019-11-19 DIAGNOSIS — Z8249 Family history of ischemic heart disease and other diseases of the circulatory system: Secondary | ICD-10-CM

## 2019-11-19 DIAGNOSIS — T50916A Underdosing of multiple unspecified drugs, medicaments and biological substances, initial encounter: Secondary | ICD-10-CM | POA: Diagnosis present

## 2019-11-19 DIAGNOSIS — I5023 Acute on chronic systolic (congestive) heart failure: Secondary | ICD-10-CM | POA: Diagnosis present

## 2019-11-19 DIAGNOSIS — Z79899 Other long term (current) drug therapy: Secondary | ICD-10-CM

## 2019-11-19 DIAGNOSIS — I4729 Other ventricular tachycardia: Secondary | ICD-10-CM

## 2019-11-19 DIAGNOSIS — I2510 Atherosclerotic heart disease of native coronary artery without angina pectoris: Secondary | ICD-10-CM | POA: Diagnosis not present

## 2019-11-19 DIAGNOSIS — Z20822 Contact with and (suspected) exposure to covid-19: Secondary | ICD-10-CM | POA: Diagnosis not present

## 2019-11-19 DIAGNOSIS — I472 Ventricular tachycardia, unspecified: Secondary | ICD-10-CM

## 2019-11-19 DIAGNOSIS — I5043 Acute on chronic combined systolic (congestive) and diastolic (congestive) heart failure: Secondary | ICD-10-CM | POA: Diagnosis not present

## 2019-11-19 DIAGNOSIS — I10 Essential (primary) hypertension: Secondary | ICD-10-CM | POA: Diagnosis present

## 2019-11-19 LAB — BASIC METABOLIC PANEL
Anion gap: 10 (ref 5–15)
BUN: 10 mg/dL (ref 6–20)
CO2: 27 mmol/L (ref 22–32)
Calcium: 9 mg/dL (ref 8.9–10.3)
Chloride: 99 mmol/L (ref 98–111)
Creatinine, Ser: 1.18 mg/dL (ref 0.61–1.24)
GFR calc Af Amer: >60 mL/min (ref >60)
GFR calc non Af Amer: >60 mL/min (ref >60)
Glucose, Bld: 169 mg/dL — ABNORMAL HIGH (ref 70–99)
Potassium: 3 mmol/L — ABNORMAL LOW (ref 3.5–5.1)
Sodium: 136 mmol/L (ref 135–145)

## 2019-11-19 LAB — CBC WITH DIFFERENTIAL/PLATELET
Abs Immature Granulocytes: 0.03 10*3/uL (ref 0.00–0.07)
Basophils Absolute: 0 10*3/uL (ref 0.0–0.1)
Basophils Relative: 0 %
Eosinophils Absolute: 0 10*3/uL (ref 0.0–0.5)
Eosinophils Relative: 0 %
HCT: 32.7 % — ABNORMAL LOW (ref 39.0–52.0)
Hemoglobin: 10.8 g/dL — ABNORMAL LOW (ref 13.0–17.0)
Immature Granulocytes: 0 %
Lymphocytes Relative: 6 %
Lymphs Abs: 0.4 10*3/uL — ABNORMAL LOW (ref 0.7–4.0)
MCH: 32.4 pg (ref 26.0–34.0)
MCHC: 33 g/dL (ref 30.0–36.0)
MCV: 98.2 fL (ref 80.0–100.0)
Monocytes Absolute: 0.5 10*3/uL (ref 0.1–1.0)
Monocytes Relative: 7 %
Neutro Abs: 6 10*3/uL (ref 1.7–7.7)
Neutrophils Relative %: 87 %
Platelets: 176 10*3/uL (ref 150–400)
RBC: 3.33 MIL/uL — ABNORMAL LOW (ref 4.22–5.81)
RDW: 13.6 % (ref 11.5–15.5)
WBC: 7 10*3/uL (ref 4.0–10.5)
nRBC: 0 % (ref 0.0–0.2)

## 2019-11-19 LAB — BRAIN NATRIURETIC PEPTIDE: B Natriuretic Peptide: 964.1 pg/mL — ABNORMAL HIGH (ref 0.0–100.0)

## 2019-11-19 LAB — RESPIRATORY PANEL BY RT PCR (FLU A&B, COVID)
Influenza A by PCR: NEGATIVE
Influenza B by PCR: NEGATIVE
SARS Coronavirus 2 by RT PCR: NEGATIVE

## 2019-11-19 LAB — TROPONIN I (HIGH SENSITIVITY)
Troponin I (High Sensitivity): 18 ng/L — ABNORMAL HIGH (ref <18)
Troponin I (High Sensitivity): 20 ng/L — ABNORMAL HIGH (ref <18)

## 2019-11-19 LAB — TSH: TSH: 0.701 u[IU]/mL (ref 0.350–4.500)

## 2019-11-19 LAB — MAGNESIUM: Magnesium: 2.1 mg/dL (ref 1.7–2.4)

## 2019-11-19 LAB — HIV ANTIBODY (ROUTINE TESTING W REFLEX): HIV Screen 4th Generation wRfx: NONREACTIVE

## 2019-11-19 MED ORDER — POTASSIUM CHLORIDE CRYS ER 20 MEQ PO TBCR
40.0000 meq | EXTENDED_RELEASE_TABLET | Freq: Two times a day (BID) | ORAL | Status: DC
  Administered 2019-11-19: 40 meq via ORAL
  Filled 2019-11-19 (×2): qty 2

## 2019-11-19 MED ORDER — AMIODARONE HCL IN DEXTROSE 360-4.14 MG/200ML-% IV SOLN
30.0000 mg/h | INTRAVENOUS | Status: DC
  Administered 2019-11-19 – 2019-11-21 (×4): 30 mg/h via INTRAVENOUS
  Filled 2019-11-19 (×4): qty 200

## 2019-11-19 MED ORDER — INFLUENZA VAC SPLIT QUAD 0.5 ML IM SUSY
0.5000 mL | PREFILLED_SYRINGE | INTRAMUSCULAR | Status: AC
  Administered 2019-11-21: 0.5 mL via INTRAMUSCULAR
  Filled 2019-11-19: qty 0.5

## 2019-11-19 MED ORDER — POTASSIUM CHLORIDE CRYS ER 20 MEQ PO TBCR
40.0000 meq | EXTENDED_RELEASE_TABLET | Freq: Once | ORAL | Status: AC
  Administered 2019-11-19: 40 meq via ORAL

## 2019-11-19 MED ORDER — FLUOXETINE HCL 20 MG PO CAPS
20.0000 mg | ORAL_CAPSULE | Freq: Every day | ORAL | Status: DC
  Administered 2019-11-19 – 2019-11-23 (×5): 20 mg via ORAL
  Filled 2019-11-19 (×6): qty 1

## 2019-11-19 MED ORDER — ATORVASTATIN CALCIUM 80 MG PO TABS
80.0000 mg | ORAL_TABLET | Freq: Every day | ORAL | Status: DC
  Administered 2019-11-19 – 2019-11-22 (×4): 80 mg via ORAL
  Filled 2019-11-19 (×5): qty 1

## 2019-11-19 MED ORDER — RIVAROXABAN 20 MG PO TABS
20.0000 mg | ORAL_TABLET | Freq: Every day | ORAL | Status: DC
  Administered 2019-11-19 – 2019-11-22 (×4): 20 mg via ORAL
  Filled 2019-11-19 (×4): qty 1

## 2019-11-19 MED ORDER — AMIODARONE LOAD VIA INFUSION
150.0000 mg | Freq: Once | INTRAVENOUS | Status: DC
  Filled 2019-11-19: qty 83.34

## 2019-11-19 MED ORDER — TIZANIDINE HCL 4 MG PO TABS
2.0000 mg | ORAL_TABLET | Freq: Three times a day (TID) | ORAL | Status: DC | PRN
  Administered 2019-11-21 – 2019-11-22 (×2): 2 mg via ORAL
  Filled 2019-11-19 (×2): qty 1

## 2019-11-19 MED ORDER — ACETAMINOPHEN 325 MG PO TABS
650.0000 mg | ORAL_TABLET | ORAL | Status: DC | PRN
  Administered 2019-11-19: 650 mg via ORAL
  Filled 2019-11-19: qty 2

## 2019-11-19 MED ORDER — POTASSIUM CHLORIDE 10 MEQ/100ML IV SOLN
10.0000 meq | INTRAVENOUS | Status: AC
  Administered 2019-11-19: 10 meq via INTRAVENOUS
  Filled 2019-11-19: qty 100

## 2019-11-19 MED ORDER — SOTALOL HCL 80 MG PO TABS
80.0000 mg | ORAL_TABLET | Freq: Two times a day (BID) | ORAL | Status: DC
  Administered 2019-11-19 – 2019-11-23 (×8): 80 mg via ORAL
  Filled 2019-11-19 (×9): qty 1

## 2019-11-19 MED ORDER — SACUBITRIL-VALSARTAN 49-51 MG PO TABS
1.0000 | ORAL_TABLET | Freq: Two times a day (BID) | ORAL | Status: DC
  Administered 2019-11-19 – 2019-11-23 (×8): 1 via ORAL
  Filled 2019-11-19 (×8): qty 1

## 2019-11-19 MED ORDER — SPIRONOLACTONE 25 MG PO TABS
25.0000 mg | ORAL_TABLET | Freq: Every day | ORAL | Status: DC
  Administered 2019-11-20 – 2019-11-23 (×4): 25 mg via ORAL
  Filled 2019-11-19 (×5): qty 1

## 2019-11-19 MED ORDER — AMIODARONE HCL IN DEXTROSE 360-4.14 MG/200ML-% IV SOLN
60.0000 mg/h | INTRAVENOUS | Status: AC
  Administered 2019-11-19: 60 mg/h via INTRAVENOUS
  Filled 2019-11-19: qty 200

## 2019-11-19 MED ORDER — PNEUMOCOCCAL VAC POLYVALENT 25 MCG/0.5ML IJ INJ
0.5000 mL | INJECTION | INTRAMUSCULAR | Status: AC
  Administered 2019-11-21: 0.5 mL via INTRAMUSCULAR
  Filled 2019-11-19: qty 0.5

## 2019-11-19 MED ORDER — AMIODARONE IV BOLUS ONLY 150 MG/100ML
150.0000 mg | Freq: Once | INTRAVENOUS | Status: AC
  Administered 2019-11-19: 150 mg via INTRAVENOUS
  Filled 2019-11-19: qty 100

## 2019-11-19 MED ORDER — ALBUTEROL SULFATE (2.5 MG/3ML) 0.083% IN NEBU: 3.0000 mL | INHALATION_SOLUTION | Freq: Four times a day (QID) | RESPIRATORY_TRACT | Status: DC | PRN

## 2019-11-19 MED FILL — MIRTAZAPINE 30 MG TABLET: 30 | 20 days supply | Qty: 30 | Fill #0

## 2019-11-19 MED FILL — AMIODARONE HCL 200 MG TAB: 200 | 30 days supply | Qty: 60 | Fill #0

## 2019-11-19 MED FILL — TORSEMIDE 20 MG TABLET: 20 | 30 days supply | Qty: 180 | Fill #0

## 2019-11-19 NOTE — Telephone Encounter (Signed)
Pt transferred to this nurse due to receiving shock from ICD while on phone with CMA.  Pt reporting feeling dizziness followed by a shock.  This is the second shock pt received in the last hour +.  Pt advised to report to ED, have someone drive him.  If pt condition changes including passing out, chest pain, pull over and call 911.  Pt v/u and stated as family member can drive him to [facility] ED right now.  After call ended transmission received.  Time stamp is off (second episode occurred at approx 1517)-  transmission does reflect multiple NSVT episodes and 2 VT episodes receivng therapy.

## 2019-11-19 NOTE — Telephone Encounter (Signed)
The pt states he was shocked again by his ICD around 2:45 pm. The pt states he felt bad all day. He felt dizzy afterwards. He was laying in the bed at the time. The pt also states he been SOB and having trouble sleeping as well. Pt is still feeling light headed again. The pt got shocked again while on the phone with me. I let him talk with device nurse Amy, Rn,

## 2019-11-19 NOTE — ED Triage Notes (Signed)
Pt arrived from home c/o of weakness and Nausea. Pt also states his defibrillator shocked him at least 2 times today within 30 mins.

## 2019-11-19 NOTE — ED Provider Notes (Signed)
[facility] EMERGENCY DEPARTMENT Provider Note   CSN: [id number] Arrival date & time: 11/19/19  1601     History Chief Complaint  Patient presents with  . Weakness  . Nausea    [name] [name] is a 59 y.o. male w/ pmhx chronic systolic CHF  (EF 15% in April 2018), dilated NICM s/p BiV ICD (Medtronic),  PAF with intermittent compliance with Xarelto, ETOH abuse, tobacco abuse, OSA on CPAP, and HTN, presenting to ED with pacemaker discharge.  Reports he felt it got off twice today.  Medtronic tech at bedside reports 4 discharges in past 24 hours, each for ATP episodes, likely V tach.  He reports currently not having any CP or lightheadedness.  He felt fine yesterday.  Today he felt "hot" all day; normally he "runs cold," but otherwise felt normal until he began to have palpitations and the pacemaker discharged.  No infectious symptoms.  Patient is on amiodarone and sotalol as outpatient.  He is not sure if he's been taking the amio, but thinks he has been, he cannot remember the names.  HPI     Past Medical History:  Diagnosis Date  . AICD (automatic cardioverter/defibrillator) present   . Arthritis   . CAD (coronary artery disease)    Mild nonobstructive (Cath 09)  . Chronic systolic heart failure (HCC)    Nonischemic CM: echo 4/12 with EF 25-30%, grade 2 diast dysfxn, mild dilated aortic root 43 mm, trivial MR, mod LAE  . COPD (chronic obstructive pulmonary disease) (HCC)   . Dyspnea   . H/O ETOH abuse   . HTN (hypertension)   . Hypersomnia   . Obesity, morbid (HCC)   . OSA on CPAP   . Systolic and diastolic CHF, acute on chronic (HCC) 11/2016  . Tobacco use disorder   . Ventricular tachycardia [facility])     Patient Active Problem List   Diagnosis Date Noted  . Ventricular tachycardia (HCC) 11/19/2019  . Primary osteoarthritis of right hip 11/03/2018  . VT (ventricular tachycardia) (HCC) 05/30/2017  . AKI (acute kidney injury) (HCC)   . Acute on chronic  systolic CHF (congestive heart failure) (HCC) 01/29/2017  . HTN (hypertension) 12/25/2016  . Obesity 12/25/2016  . OSA (obstructive sleep apnea) 12/25/2016  . Hyperglycemia 12/25/2016  . Acute respiratory failure with hypoxia (HCC) 12/25/2016  . Chronic anticoagulation 06/27/2016  . Encounter for therapeutic drug monitoring 04/19/2015  . S/P ICD (internal cardiac defibrillator) procedure Medtronic CRT ICD 04/15/2015  . Atrial fibrillation with RVR - Paroxysmal 04/08/2015  . NICM- EF 25% 2012 & 20-25% 2016 04/06/2015  . Troponin level elevated 04/06/2015  . Rectal bleeding 10/26/2011  . Hydrocele, left 05/24/2011  . Tobacco use disorder- 1/2 pk a day   . H/O ETOH abuse 6-8 beers a day   . Obesity 09/27/2008  . CAD- minor CAD 2009 and 2016 09/27/2008  . VENTRICULAR TACHYCARDIA 09/27/2008  . Essential hypertension 06/19/2008  . Chronic systolic heart failure (HCC) 06/19/2008    Past Surgical History:  Procedure Laterality Date  . CARDIAC CATHETERIZATION N/A 04/08/2015   Procedure: Right/Left Heart Cath and Coronary Angiography;  Surgeon: Corky Crafts, MD;  Location: [facility] INVASIVE CV LAB;  Service: Cardiovascular;  Laterality: N/A;  . EP IMPLANTABLE DEVICE N/A 04/14/2015   Procedure: BiV ICD Insertion CRT-D;  Surgeon: [name], MD;  Location: [facility] INVASIVE CV LAB;  Service: Cardiovascular;  Laterality: N/A;  . HYDROCELE EXCISION / REPAIR    . TOTAL HIP ARTHROPLASTY Right  11/04/2018   Procedure: TOTAL HIP ARTHROPLASTY ANTERIOR APPROACH;  Surgeon: [name], MD;  Location: [facility] ORS;  Service: Orthopedics;  Laterality: Right;  . TUMOR EXCISION Left   . 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 VASECTOMY         Family History  Problem Relation Age of Onset  . Other Mother        cardiac surgery. late 1990s  . Heart Problems Mother        CABG AGE 12  . Congestive Heart Failure Father   . Healthy Father        AGE 44  . Coronary artery disease Other    . Healthy Brother        AGE 107  . Healthy Sister        AGE 57  . Healthy Sister        AGE 46  . Healthy Son   . Healthy Son   . Healthy Daughter     Social History   Tobacco Use  . Smoking status: Former Smoker    Packs/day: 0.50    Years: 4.00    Pack years: 2.00    Types: Cigarettes    Quit date: 10/29/2005    Years since quitting: 14.0  . Smokeless tobacco: Never Used  . Tobacco comment: about a pack a week. social  Substance Use Topics  . Alcohol use: Yes    Alcohol/week: 42.0 standard drinks    Types: 42 Cans of beer per week    Comment: 24 a week  . Drug use: Yes    Frequency: 7.0 times per week    Types: Marijuana    Comment: has discontinued    Home Medications Prior to Admission medications   Medication Sig Start Date End Date Taking? Authorizing Provider  acetaminophen (TYLENOL) 325 MG tablet Take 2 tablets (650 mg total) by mouth every 4 (four) hours as needed for headache or mild pain. 04/15/15  Yes [name], NP  albuterol (PROVENTIL HFA;VENTOLIN HFA) 108 (90 Base) MCG/ACT inhaler Inhale 1 puff into the lungs every 6 (six) hours as needed for wheezing or shortness of breath. 01/24/17  Yes [name], NP  amiodarone (PACERONE) 200 MG tablet Take 1 tablet (200 mg total) by mouth 2 (two) times daily. Decrease to 1 tablet (200 mg total) by mouth once daily beginning 10/20/19. 10/08/19  Yes [name], MD  aspirin EC 325 MG tablet Take 1 tablet (325 mg total) by mouth 2 (two) times daily after a meal. Take x 1 month post op to decrease risk of blood clots. 11/04/18  Yes Marshia Ly, PA-C  FLUoxetine (PROZAC) 20 MG tablet Take 20 mg by mouth daily.   Yes [provider]  potassium chloride SA (KLOR-CON) 20 MEQ tablet Take 2 tablets (40 mEq total) by mouth 2 (two) times daily. 10/16/19  Yes Clegg, Amy D, NP  rivaroxaban (XARELTO) 20 MG TABS tablet Take 1 tablet (20 mg total) by mouth daily with supper. 06/13/19  Yes Clegg, Amy D, NP    sacubitril-valsartan (ENTRESTO) 49-51 MG Take 1 tablet by mouth 2 (two) times daily. 06/10/18  Yes [name], MD  sotalol (BETAPACE) 80 MG tablet TAKE 1 TABLET BY MOUTH EVERY 12 HOURS Patient taking differently: Take 80 mg by mouth daily.  09/29/19  Yes [name], MD  torsemide (DEMADEX) 20 MG tablet TAKE 3 TABLETS BY MOUTH TWO TIMES DAILY Patient  taking differently: Take 60 mg by mouth daily.  11/19/19  Yes Larey Dresser, MD  atorvastatin (LIPITOR) 80 MG tablet TAKE 1 TABLET BY MOUTH ONCE DAILY AT  6  PM Patient not taking: Reported on 11/19/2019 09/16/19   Larey Dresser, MD  dapagliflozin propanediol (FARXIGA) 10 MG TABS tablet Take 10 mg by mouth daily before breakfast. Patient not taking: Reported on 11/19/2019 09/16/19   Larey Dresser, MD  spironolactone (ALDACTONE) 25 MG tablet TAKE 1 TABLET BY MOUTH ONCE DAILY (NEED  OFFICE  VISIT  FOR  FUTURE  REFILLS) Patient not taking: Reported on 11/19/2019 10/16/19   Darrick Grinder D, NP  tiZANidine (ZANAFLEX) 2 MG tablet Take 1 tablet (2 mg total) by mouth every 8 (eight) hours as needed for muscle spasms. Patient not taking: Reported on 11/19/2019 11/04/18   Gary Fleet, PA-C    Allergies    Patient has no known allergies.  Review of Systems   Review of Systems  Constitutional: Negative for chills and fever.  HENT: Negative for ear pain and sore throat.   Eyes: Negative for pain and visual disturbance.  Respiratory: Negative for cough and shortness of breath.   Cardiovascular: Negative for chest pain and palpitations.  Gastrointestinal: Negative for abdominal pain and vomiting.  Genitourinary: Negative for dysuria and hematuria.  Musculoskeletal: Negative for arthralgias and back pain.  Skin: Negative for color change and rash.  Neurological: Negative for seizures, syncope, light-headedness and numbness.  All other systems reviewed and are negative.   Physical Exam Updated Vital Signs BP (!) 104/59 (BP Location: Left Arm)    Pulse 69   Temp 98.9 F (37.2 C) (Axillary)   Resp 16   Ht 6\' 4"  (1.93 m)   Wt (!) 142.4 kg   SpO2 92%   BMI 38.21 kg/m   Physical Exam Vitals and nursing note reviewed.  Constitutional:      Appearance: He is well-developed. He is obese.  HENT:     Head: Normocephalic and atraumatic.  Eyes:     Conjunctiva/sclera: Conjunctivae normal.  Cardiovascular:     Rate and Rhythm: Normal rate and regular rhythm.     Pulses: Normal pulses.     Comments: Occasional PVC's noted on telemetry monitor Pulmonary:     Effort: Pulmonary effort is normal. No respiratory distress.     Breath sounds: Normal breath sounds.  Abdominal:     General: There is no distension.     Palpations: Abdomen is soft.     Tenderness: There is no abdominal tenderness.  Musculoskeletal:     Cervical back: Neck supple.  Skin:    General: Skin is warm and dry.  Neurological:     General: No focal deficit present.     Mental Status: He is alert and oriented to person, place, and time.  Psychiatric:        Mood and Affect: Mood normal.        Behavior: Behavior normal.     ED Results / Procedures / Treatments   Labs (all labs ordered are listed, but only abnormal results are displayed) Labs Reviewed  BASIC METABOLIC PANEL - Abnormal; Notable for the following components:      Result Value   Potassium 3.0 (*)    Glucose, Bld 169 (*)    All other components within normal limits  CBC WITH DIFFERENTIAL/PLATELET - Abnormal; Notable for the following components:   RBC 3.33 (*)    Hemoglobin 10.8 (*)  HCT 32.7 (*)    Lymphs Abs 0.4 (*)    All other components within normal limits  BRAIN NATRIURETIC PEPTIDE - Abnormal; Notable for the following components:   B Natriuretic Peptide 964.1 (*)    All other components within normal limits  TROPONIN I (HIGH SENSITIVITY) - Abnormal; Notable for the following components:   Troponin I (High Sensitivity) 18 (*)    All other components within normal limits    TROPONIN I (HIGH SENSITIVITY) - Abnormal; Notable for the following components:   Troponin I (High Sensitivity) 20 (*)    All other components within normal limits  RESPIRATORY PANEL BY RT PCR (FLU A&B, COVID)  MRSA PCR SCREENING  MAGNESIUM  TSH  HIV ANTIBODY (ROUTINE TESTING W REFLEX)  BASIC METABOLIC PANEL    EKG None  Radiology DG Chest Portable 1 View  Result Date: 11/19/2019 CLINICAL DATA:  Defibrillator activation EXAM: PORTABLE CHEST 1 VIEW COMPARISON:  10/29/2018 FINDINGS: Stable positioning of a left-sided implanted cardiac device. Low lung volumes. Grossly stable heart size. There is pulmonary vascular congestion and crowding of the central bronchovascular markings, likely accentuated by low lung volumes. No focal airspace consolidation. No large pleural fluid collection. No pneumothorax. IMPRESSION: Low lung volumes with pulmonary vascular congestion. Electronically Signed   By: Duanne Guess D.O.   On: 11/19/2019 16:37    Procedures .Critical Care Performed by: Terald Sleeper, MD Authorized by: Terald Sleeper, MD   Critical care provider statement:    Critical care time (minutes):  52   Critical care was necessary to treat or prevent imminent or life-threatening deterioration of the following conditions:  Circulatory failure   Critical care was time spent personally by me on the following activities:  Discussions with consultants, evaluation of patient's response to treatment, examination of patient, ordering and performing treatments and interventions, ordering and review of laboratory studies, ordering and review of radiographic studies, pulse oximetry, re-evaluation of patient's condition, obtaining history from patient or surrogate and review of old charts Comments:     Arrhythmia with ventricular tachycardia requiring IV rhythm control agents, repeat ECG, discussion with cardiology, repletion of low potassium, frequent reassessments Ultrasound ED  Echo  Date/Time: 11/19/2019 5:27 PM Performed by: Terald Sleeper, MD Authorized by: Terald Sleeper, MD   Procedure details:    Indications: dyspnea     Views: subxiphoid     Images: archived     Limitations:  Body habitus, increased thoracic air, positioning and patient compliance Findings:    Pericardium: small pericardial effusion   Impression:    Impression: pericardial effusion present     (including critical care time)  Medications Ordered in ED Medications  amiodarone (NEXTERONE PREMIX) 360-4.14 MG/200ML-% (1.8 mg/mL) IV infusion (0 mg/hr Intravenous Stopped 11/19/19 2312)    Followed by  amiodarone (NEXTERONE PREMIX) 360-4.14 MG/200ML-% (1.8 mg/mL) IV infusion (30 mg/hr Intravenous New Bag/Given 11/19/19 2313)  potassium chloride SA (KLOR-CON) CR tablet 40 mEq (40 mEq Oral Given 11/19/19 1736)  potassium chloride 10 mEq in 100 mL IVPB (10 mEq Intravenous Not Given 11/19/19 1943)  acetaminophen (TYLENOL) tablet 650 mg (650 mg Oral Given 11/19/19 2212)  atorvastatin (LIPITOR) tablet 80 mg (80 mg Oral Given 11/19/19 1948)  sacubitril-valsartan (ENTRESTO) 49-51 mg per tablet (1 tablet Oral Given 11/19/19 2129)  sotalol (BETAPACE) tablet 80 mg (80 mg Oral Given 11/19/19 2129)  spironolactone (ALDACTONE) tablet 25 mg (25 mg Oral Canceled Entry 11/19/19 1948)  FLUoxetine (PROZAC) capsule 20 mg (20 mg Oral  Given 11/19/19 2128)  rivaroxaban (XARELTO) tablet 20 mg (20 mg Oral Given 11/19/19 2128)  tiZANidine (ZANAFLEX) tablet 2 mg (has no administration in time range)  albuterol (PROVENTIL) (2.5 MG/3ML) 0.083% nebulizer solution 3 mL (has no administration in time range)  influenza vac split quadrivalent PF (FLUARIX) injection 0.5 mL (has no administration in time range)  pneumococcal 23 valent vaccine (PNEUMOVAX-23) injection 0.5 mL (has no administration in time range)  amiodarone (NEXTERONE) IV bolus only 150 mg/100 mL (0 mg Intravenous Stopped 11/19/19 1722)  potassium chloride SA (KLOR-CON) CR  tablet 40 mEq (40 mEq Oral Given 11/19/19 2210)    ED Course  I have reviewed the triage vital signs and the nursing notes.  Pertinent labs & imaging results that were available during my care of the patient were reviewed by me and considered in my medical decision making (see chart for details).  59 yo male presenting to the ED with defibrillator discharge x 4 in past 24 hours.  Stable here, not in distress.  HR 50-70 bpm with frequent PVC's on the monitor.  He has advanced heart failure.  Apparently no hx of MI, although he has obesity and cardiac risk factors.  Pacemaker pads are on.  Medtronic tech at bedside interrogating device.  EP team contacted and will come evaluate.  We'll check his electrolyte, mag, trop Chest xray for lead placement issues  Ordered IV amiodarone bolus - it's not clear if he's been compliant with this medicine.  No active CP to suggest ACS.  I suspect his initial trop will be elevated in the setting of these recent shocks, but the cardiologists may wish to trend this.  His ECG has multiple PVC's, no clear signs of ischemia  No acute resp distress or dyspnea to suggest CHF exacerbation, although with his degree of heart failure there is likely some mild overload component.    No infectious symptoms to suggest covid or PNA at this time  Clinical Course as of Nov 20 27  Wed Nov 19, 2019  1631 Cardiology card master Trish reported EP team is coming down to see patient   [MT]  1633 Patient on pace pads, medtronic tech at the bedside interrogating device   [MT]  1634 ECG on arrival with PVC's, questionable alterans pattern, no clear ST elevations   [MT]  1659 Potassium(!): 3.0 [MT]  1727 With questionable alternans pattern I performed bedside echo, extremely limited by patient's habitus, but I was able to determine from subX view a small pericardial effusion, no clear tamponade effect, doubtful this is the cause of his symptoms   [MT]  1801 Admitted to step down  ICU   [MT]    Clinical Course User Index [MT] Braydan Marriott, Kermit Balo, MD    Final Clinical Impression(s) / ED Diagnoses Final diagnoses:  Paroxysmal ventricular tachycardia (HCC)  ICD (implantable cardioverter-defibrillator) discharge    Rx / DC Orders ED Discharge Orders    None       Lilli Dewald, Kermit Balo, MD 11/20/19 0030

## 2019-11-19 NOTE — ED Notes (Signed)
[phone] Dede pts significant other would like a call back with an update

## 2019-11-19 NOTE — H&P (Addendum)
Cardiology Admission History and Physical:   Patient ID: [name] [name] MRN: [id number]; DOB: 09/11/1961   Admission date: 11/19/2019  Primary Care Provider: [name], MD Primary Cardiologist: No primary care provider on file.  Primary Electrophysiologist:  [name] Manges, MD  Advanced Heart Failure: [name]. Marca [name]  Chief Complaint:  Shocked by ICD  Patient Profile:   [name] [name] is a 59 y.o. male with past medical history of chronic systolic CHF  (EF 15% in April 2018), dilated NICM s/p BiV ICD, PAF with intermittent compliance with Xarelto, ETOH abuse, tobacco abuse, OSA on CPAP, and HTN  who presented to the ED after receiving 2 ICD shocks (at least 2).   History of Present Illness:   [name] past medical history of chronic systolic CHF  (EF 15% in April 2018), dilated NICM s/p BiV ICD, PAF with intermittent compliance with Xarelto, ETOH abuse, tobacco abuse, OSA on CPAP, and HTN who presented to the ED after receiving 2 ICD shocks (at least 2).   Admitted 12/25/16-12/27/16 with acute on chronic systolic CHF, volume overload. He was diuresed 11 pounds on IV lasix.  Discharge weight was 294 pounds. It was felt that the etiology of his NICM was ETOH vs hypertension related.   Admitted 5/14-5/16/18 with volume overload, acute respiratory failure requiring Bipap. He was not taking his Entresto as he was confused about dosing and there was a mix up his pharmacy. He was diuresed with IV lasix, discharge weight was 295 pounds.   Admitted 05/30/17-06/02/17 with VT, ICD shock x 4. He is now on amiodarone and sotalol.  Echo 09/16/19,  EF 20-25%, severe LV dilation, moderate LVH, normal RV, small IVC.   Says he started farxiga on 12/30 and took the 2nd dose the next day. Says he got shocked the next so he stopped farxiga. Had shock x 2 09/19/19 and 09/25/19. [name] [name] has been following amio 200 mg twice a day until 10/20/19.   He had ICD shocks on 09/22/19. ATP x 3 that accelerated VT,  transitioned to VF after first 35J shock, VF terminated with second 35J shock. 1 NSVT episode on 09/24/19, 21 beats at 217 bpm.    Amiodarone was decreased to 200 mg daily at EP follow-up on 10/21/2019.  He was continued on sotalol.  At heart failure follow-up on 10/16/2019 it was noted that torsemide had been recently cut down.  He was Overall feeling fine. Mild shortness of breath with exertion. SOB with steps. Denied PND/Orthopnea. Using CPAP every night. Weight at home 312-317 pounds.  Was taking all medications. Drinking 12 pack on the weekends.   Today [name] [name] says that he has been feeling fatigued all week.  No specific chest discomfort and not much increase in his baseline dyspnea on exertion which he says he has had for about the last 5-6 months.  He intermittently has worse shortness of breath.  He has been out of several of his medications including Klor-Con, Entresto and atorvastatin.  He states that he has been taking his torsemide, Pacerone and sotalol.  He has been out of his medications due to high deductibles.  He has been taking samples of Xarelto.  He is unaware of being on spironolactone and this does not know if he has been out of it or taking it.  He says that at this time he has gotten approval for his medications.  He has them ordered through the [facility] outpatient pharmacy but has not yet received them.  Today  he went to his primary care doctor this morning who assisted him with renewing some of his medications.  He was feeling okay other than having been fatigued for the last week.  He then went home and ate lunch.  He had some mild nausea after lunch but no vomiting.  He then laid down for nap with his CPAP on.  He woke up feeling "weird" with lightheadedness and nausea.  He took off his CPAP and then got shocked by his ICD.  He called the device clinic to report the shock and again had lightheadedness and nausea and was shocked while on the phone.  He notes that he had some  mild ankle edema on Sunday which improved after he elevated his legs.  Heart Pathway Score:     Past Medical History:  Diagnosis Date  . AICD (automatic cardioverter/defibrillator) present   . Arthritis   . CAD (coronary artery disease)    Mild nonobstructive (Cath 09)  . Chronic systolic heart failure (HCC)    Nonischemic CM: echo 4/12 with EF 25-30%, grade 2 diast dysfxn, mild dilated aortic root 43 mm, trivial MR, mod LAE  . COPD (chronic obstructive pulmonary disease) ([place])   . Dyspnea   . H/O ETOH abuse   . HTN (hypertension)   . Hypersomnia   . Obesity, morbid ([place])   . OSA on CPAP   . Systolic and diastolic CHF, acute on chronic ([place]) 11/2016  . Tobacco use disorder   . Ventricular tachycardia [facility])     Past Surgical History:  Procedure Laterality Date  . CARDIAC CATHETERIZATION N/A 04/08/2015   Procedure: Right/Left Heart Cath and Coronary Angiography;  Surgeon: Jettie Booze, MD;  Location: [place] [facility];  Service: Cardiovascular;  Laterality: N/A;  . EP IMPLANTABLE DEVICE N/A 04/14/2015   Procedure: BiV ICD Insertion CRT-D;  Surgeon: Deboraha Sprang, MD;  Location: [place] [facility];  Service: Cardiovascular;  Laterality: N/A;  . HYDROCELE EXCISION / REPAIR    . TOTAL HIP ARTHROPLASTY Right 11/04/2018   Procedure: TOTAL HIP ARTHROPLASTY ANTERIOR APPROACH;  Surgeon: Dorna Leitz, MD;  Location: [facility];  Service: Orthopedics;  Laterality: Right;  . TUMOR EXCISION Left   . 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 VASECTOMY       Medications Prior to Admission: Prior to Admission medications   Medication Sig Start Date End Date Taking? Authorizing Provider  acetaminophen (TYLENOL) 325 MG tablet Take 2 tablets (650 mg total) by mouth every 4 (four) hours as needed for headache or mild pain. 04/15/15   Isaiah Serge, NP  albuterol (PROVENTIL HFA;VENTOLIN HFA) 108 (90 Base) MCG/ACT inhaler Inhale 1 puff into the lungs every 6 (six) hours as  needed for wheezing or shortness of breath. 01/24/17   Arbutus Leas, NP  amiodarone (PACERONE) 200 MG tablet Take 1 tablet (200 mg total) by mouth 2 (two) times daily. Decrease to 1 tablet (200 mg total) by mouth once daily beginning 10/20/19. 10/08/19   Deboraha Sprang, MD  aspirin EC 325 MG tablet Take 1 tablet (325 mg total) by mouth 2 (two) times daily after a meal. Take x 1 month post op to decrease risk of blood clots. 11/04/18   Gary Fleet, PA-C  atorvastatin (LIPITOR) 80 MG tablet TAKE 1 TABLET BY MOUTH ONCE DAILY AT  6  PM 09/16/19   Larey Dresser, MD  dapagliflozin propanediol (FARXIGA) 10 MG TABS tablet Take  10 mg by mouth daily before breakfast. 09/16/19   [name], MD  FLUoxetine (PROZAC) 20 MG tablet Take 20 mg by mouth daily.    [provider]  potassium chloride SA (KLOR-CON) 20 MEQ tablet Take 2 tablets (40 mEq total) by mouth 2 (two) times daily. 10/16/19   Clegg, Amy D, NP  rivaroxaban (XARELTO) 20 MG TABS tablet Take 1 tablet (20 mg total) by mouth daily with supper. 06/13/19   Clegg, Amy D, NP  sacubitril-valsartan (ENTRESTO) 49-51 MG Take 1 tablet by mouth 2 (two) times daily. 06/10/18   [name], MD  sotalol (BETAPACE) 80 MG tablet TAKE 1 TABLET BY MOUTH EVERY 12 HOURS 09/29/19   [name], MD  spironolactone (ALDACTONE) 25 MG tablet TAKE 1 TABLET BY MOUTH ONCE DAILY (NEED  OFFICE  VISIT  FOR  FUTURE  REFILLS) 10/16/19   Clegg, Amy D, NP  tiZANidine (ZANAFLEX) 2 MG tablet Take 1 tablet (2 mg total) by mouth every 8 (eight) hours as needed for muscle spasms. 11/04/18   Marshia Ly, PA-C  torsemide (DEMADEX) 20 MG tablet Take 3 tablets (60 mg total) by mouth 2 (two) times daily. 10/16/19   Clegg, Amy D, NP     Allergies:   No Known Allergies  Social History:   Social History   Socioeconomic History  . Marital status: Divorced    Spouse name: Not on file  . Number of children: 2  . Years of education: Not on file  . Highest education level:  Not on file  Occupational History  . Occupation: Copy  . Occupation: disabled, odd jobs  Tobacco Use  . Smoking status: Former Smoker    Packs/day: 0.50    Years: 4.00    Pack years: 2.00    Types: Cigarettes    Quit date: 10/29/2005    Years since quitting: 14.0  . Smokeless tobacco: Never Used  . Tobacco comment: about a pack a week. social  Substance and Sexual Activity  . Alcohol use: Yes    Alcohol/week: 42.0 standard drinks    Types: 42 Cans of beer per week    Comment: 24 a week  . Drug use: Yes    Frequency: 7.0 times per week    Types: Marijuana    Comment: has discontinued  . Sexual activity: Yes    Partners: Female  Other Topics Concern  . Not on file  Social History Narrative   ** Merged History Encounter **       Social Determinants of Health   Financial Resource Strain:   . Difficulty of Paying Living Expenses: Not on file  Food Insecurity:   . Worried About Programme researcher, broadcasting/film/video in the Last Year: Not on file  . Ran Out of Food in the Last Year: Not on file  Transportation Needs:   . Lack of Transportation (Medical): Not on file  . Lack of Transportation (Non-Medical): Not on file  Physical Activity:   . Days of Exercise per Week: Not on file  . Minutes of Exercise per Session: Not on file  Stress:   . Feeling of Stress : Not on file  Social Connections:   . Frequency of Communication with Friends and Family: Not on file  . Frequency of Social Gatherings with Friends and Family: Not on file  . Attends Religious Services: Not on file  . Active Member of Clubs or Organizations: Not on file  . Attends Banker Meetings:  Not on file  . Marital Status: Not on file  Intimate Partner Violence:   . Fear of Current or Ex-Partner: Not on file  . Emotionally Abused: Not on file  . Physically Abused: Not on file  . Sexually Abused: Not on file    Family History:   The patient's family history includes Congestive Heart Failure in his  father; Coronary artery disease in an other family member; Healthy in his brother, daughter, father, sister, sister, son, and son; Heart Problems in his mother; Other in his mother.    ROS:  Please see the history of present illness.  All other ROS reviewed and negative.     Physical Exam/Data:   Vitals:   11/19/19 1602 11/19/19 1625  BP: (!) 140/55   Pulse: 65   Resp: 18   Temp: 98.3 F (36.8 C)   TempSrc: Oral   SpO2: 96%   Weight:  (!) 144.2 kg  Height:  6\' 2"  (1.88 m)   No intake or output data in the 24 hours ending 11/19/19 1634 Last 3 Weights 11/19/2019 10/21/2019 10/16/2019  Weight (lbs) 318 lb 315 lb 12.8 oz 313 lb  Weight (kg) 144.244 kg 143.246 kg 141.976 kg     Body mass index is 40.83 kg/m.  General:  Well nourished, well developed, in no acute distress HEENT: normal Lymph: no adenopathy Neck: no JVD Endocrine:  No thryomegaly Vascular: No carotid bruits; FA pulses 2+ bilaterally without bruits  Cardiac:  normal S1, S2; RRR; no murmur  Lungs:  clear to auscultation bilaterally, no wheezing, rhonchi or rales  Abd: soft, nontender, no hepatomegaly, large rounded abdomen Ext: no edema Musculoskeletal:  No deformities, BUE and BLE strength normal and equal Skin: warm and dry  Neuro:  CNs 2-12 intact, no focal abnormalities noted Psych:  Normal affect    EKG:  The ECG that was done was personally reviewed and demonstrates sinus rhythm and V pacing in the 70s with PVCs  Relevant CV Studies:  Echocardiogram 09/16/2019  1. Left ventricular ejection fraction, by visual estimation, is 20 to  25%. The left ventricle has severely decreased function. There is  moderately increased left ventricular hypertrophy.  2. Left ventricular diastolic parameters are consistent with Grade II  diastolic dysfunction (pseudonormalization).  3. Severely dilated left ventricular internal cavity size.  4. The left ventricle demonstrates global hypokinesis.  5. Global right  ventricle has normal systolic function.The right  ventricular size is normal. No increase in right ventricular wall  thickness.  6. A pacer wire is visualized in the RV.  7. Left atrial size was moderately dilated.  8. Right atrial size was mildly dilated.  9. The mitral valve is normal in structure. No evidence of mitral valve  regurgitation. No evidence of mitral stenosis.  10. The tricuspid valve is normal in structure.  11. The aortic valve is tricuspid. Aortic valve regurgitation is not  visualized. No evidence of aortic valve sclerosis or stenosis.  12. TR signal is inadequate for assessing pulmonary artery systolic  pressure.  13. The inferior vena cava is normal in size with greater than 50%  respiratory variability, suggesting right atrial pressure of 3 mmHg.   Laboratory Data:  High Sensitivity Troponin:  No results for input(s): TROPONINIHS in the last 720 hours.    ChemistryNo results for input(s): NA, K, CL, CO2, GLUCOSE, BUN, CREATININE, CALCIUM, GFRNONAA, GFRAA, ANIONGAP in the last 168 hours.  No results for input(s): PROT, ALBUMIN, AST, ALT, ALKPHOS, BILITOT in the  last 168 hours. Hematology Recent Labs  Lab 11/19/19 1620  WBC 7.0  RBC 3.33*  HGB 10.8*  HCT 32.7*  MCV 98.2  MCH 32.4  MCHC 33.0  RDW 13.6  PLT 176   BNPNo results for input(s): BNP, PROBNP in the last 168 hours.  DDimer No results for input(s): DDIMER in the last 168 hours.   Radiology/Studies:  No results found.       Assessment and Plan:   Ventricular tachycardia with ICD shock -Patient also had ICD shocks in January and was given amiodarone 200 mg twice daily which was decreased in follow-up on 2/2 to 200 mg daily. -Patient has been without several medications including Entresto and Klor-Con.  He has continued to take his Pacerone, sotalol and torsemide.  He was appropriately shocked with a total of 4 shocks over 3 episodes and one ATP terminated episode upon device interrogation  today.  He was noted to have fast VT with rates to 120s to 270s. -We Kadence Mimbs admit the patient to stepdown for at least tonight. -The patient has been loaded with 150 mg of IV amiodarone and Laylaa Guevarra start an amiodarone load with IV drip overnight. -Potassium is noted to be 3.0.  Jenene Kauffmann replete potassium with K. Dur 40 mEq x 2 today and then twice daily.  Aim for potassium >=4. Magnesium is 2.1, adequate >2.  -Interruption in his normal medications including Klor-Con and Entresto may have contributed to this episode of VT.  Patient states that he now has his medications under control.  We Hulon Ferron make sure he has some before he leaves the hospital.  Acute on chronic systolic heart failure -Patient with known nonischemic cardiomyopathy, EF 15% in 12/2016.  Normal coronaries on cath in July 2016.  Patient has [employer] CRT-D device (still has wide complex).  Echo on 09/16/2019 showed EF 15%. -Medical therapy includes Entresto 49-51 mg twice daily, spironolactone 25 mg daily and torsemide 60 mg twice daily.  He is noted to be intolerant to Coreg in the past due to hypotension, currently on sotalol and amiodarone. -Patient has been out of his Entresto and possibly spironolactone for about 2 weeks.  He was continuing to take his torsemide 60 mg twice daily. -CXR Shows low lung volumes with pulmonary vascular congestion. -Patient has had chronic dyspnea on exertion for about the last 5-6 months.  No significant edema or orthopnea at this time. -We Jiyah Torpey diurese the patient with Lasix 80 mg IV twice daily while in the hospital for volume overload.  -Daily weights and strict intake and output.  -[name]. Elberta Fortis Ilya Ess let [name]. Shirlee Latch know that pt is here.   Hypokalemia -Patient has been out of his Klor-Con at home and still taking torsemide.  Potassium was 3.0 on admission.  Sundy Houchins replete with K. Dur 40 mEq x 2 today and twice daily starting tomorrow. -We Cesareo Vickrey follow Bmet. -Target potassium >=4.   Paroxysmal atrial  fibrillation -Patient continues on amiodarone. -Heart rates are currently well controlled in the 70s. -Patient is on Xarelto 20 mg daily for stroke risk reduction.  Hypertension -Blood pressure currently well controlled.  Obstructive sleep apnea -Patient reports compliance with CPAP.  Continue CPAP at bedtime while in the hospital.  EtOH abuse -Suspected to be possibly contributing to his cardiomyopathy.  Patient has been counseled on cessation in the office, but reportedly still drinking heavily.  Severity of Illness: The appropriate patient status for this patient is INPATIENT. Inpatient status is judged to be reasonable and necessary in  order to provide the required intensity of service to ensure the patient's safety. The patient's presenting symptoms, physical exam findings, and initial radiographic and laboratory data in the context of their chronic comorbidities is felt to place them at high risk for further clinical deterioration. Furthermore, it is not anticipated that the patient Eather Chaires be medically stable for discharge from the hospital within 2 midnights of admission. The following factors support the patient status of inpatient.   " The patient's presenting symptoms include Vtach appropriately treated with shocks from his ICD x4. " The worrisome physical exam findings include normal physical exam. " The initial radiographic and laboratory data are worrisome because of fluid overload on chest x-ray, VT noted on his device interrogation. " The chronic co-morbidities include chronic systolic heart failure, atrial fibrillation, hypertension, obstructive sleep apnea.   * I certify that at the point of admission it is my clinical judgment that the patient Amerie Beaumont require inpatient hospital care spanning beyond 2 midnights from the point of admission due to high intensity of service, high risk for further deterioration and high frequency of surveillance required.*    For questions or  updates, please contact [facility] Please consult [email or web address] for contact info under        Signed, Berton Bon, NP  11/19/2019 4:34 PM   I have seen and examined this patient with Lizabeth Leyden.  Agree with above, note added to reflect my findings.  On exam, RRR, no murmurs, lungs clear. Patient presented to the ER after multiple ICD shocks. Shocks for VT. Patient with a history of ICD shocks.  Is currently on both amiodarone and sotalol 80 mg.  He was in his usual state of health when he started receiving shocks.  He was actually on the phone with device clinic when he received a shock.  He had dizziness prior to his ICD shocks.  He did receive ATP which degenerated his VT into VF thus requiring ICD shocks.  His potassium is low at 3 and thus we Javaun Dimperio replete.  His BNP is also elevated at 964.  He has been out of his Sherryll Burger for approximately 2 weeks and thus Laquandra Carrillo need this to be restarted.  It is certainly possible that his lack of Entresto as well as hypokalemia and acute on chronic heart failure exacerbated his arrhythmia burden.  We Treyvion Durkee diurese him with 80 mg of Lasix IV twice daily.  We Zyhir Cappella also start him on an amiodarone drip.  The case has been discussed with [name]. Sherlie Ban and [name]. Graciela [name], the patient's primary cardiologists.  Joseph Bias M. Denetta Fei MD 11/19/2019 9:55 PM

## 2019-11-20 ENCOUNTER — Telehealth (HOSPITAL_COMMUNITY): Payer: Self-pay | Admitting: Licensed Clinical Social Worker

## 2019-11-20 DIAGNOSIS — I5043 Acute on chronic combined systolic (congestive) and diastolic (congestive) heart failure: Secondary | ICD-10-CM

## 2019-11-20 LAB — BASIC METABOLIC PANEL
Anion gap: 9 (ref 5–15)
BUN: 9 mg/dL (ref 6–20)
CO2: 28 mmol/L (ref 22–32)
Calcium: 8.6 mg/dL — ABNORMAL LOW (ref 8.9–10.3)
Chloride: 102 mmol/L (ref 98–111)
Creatinine, Ser: 1.11 mg/dL (ref 0.61–1.24)
GFR calc Af Amer: >60 mL/min (ref >60)
GFR calc non Af Amer: >60 mL/min (ref >60)
Glucose, Bld: 149 mg/dL — ABNORMAL HIGH (ref 70–99)
Potassium: 3.2 mmol/L — ABNORMAL LOW (ref 3.5–5.1)
Sodium: 139 mmol/L (ref 135–145)

## 2019-11-20 LAB — URINALYSIS, COMPLETE (UACMP) WITH MICROSCOPIC
Bilirubin Urine: NEGATIVE
Glucose, UA: NEGATIVE mg/dL
Hgb urine dipstick: NEGATIVE
Ketones, ur: NEGATIVE mg/dL
Nitrite: POSITIVE — AB
Protein, ur: NEGATIVE mg/dL
Specific Gravity, Urine: 1.006 (ref 1.005–1.030)
pH: 7 (ref 5.0–8.0)

## 2019-11-20 LAB — MRSA PCR SCREENING: MRSA by PCR: NEGATIVE

## 2019-11-20 LAB — POTASSIUM: Potassium: 3.5 mmol/L (ref 3.5–5.1)

## 2019-11-20 MED ORDER — POTASSIUM CHLORIDE CRYS ER 20 MEQ PO TBCR
40.0000 meq | EXTENDED_RELEASE_TABLET | Freq: Two times a day (BID) | ORAL | Status: DC
  Administered 2019-11-20: 40 meq via ORAL
  Filled 2019-11-20: qty 2

## 2019-11-20 MED ORDER — FUROSEMIDE 10 MG/ML IJ SOLN
80.0000 mg | Freq: Two times a day (BID) | INTRAMUSCULAR | Status: DC
  Administered 2019-11-20 – 2019-11-22 (×5): 80 mg via INTRAVENOUS
  Filled 2019-11-20 (×5): qty 8

## 2019-11-20 MED ORDER — FUROSEMIDE 10 MG/ML IJ SOLN: 40.0000 mg | Freq: Two times a day (BID) | INTRAMUSCULAR | Status: DC

## 2019-11-20 MED ORDER — THIAMINE HCL 100 MG PO TABS
100.0000 mg | ORAL_TABLET | Freq: Every day | ORAL | Status: DC
  Administered 2019-11-20 – 2019-11-23 (×4): 100 mg via ORAL
  Filled 2019-11-20 (×4): qty 1

## 2019-11-20 MED ORDER — FOLIC ACID 1 MG PO TABS
1.0000 mg | ORAL_TABLET | Freq: Every day | ORAL | Status: DC
  Administered 2019-11-20 – 2019-11-23 (×4): 1 mg via ORAL
  Filled 2019-11-20 (×4): qty 1

## 2019-11-20 MED ORDER — POTASSIUM CHLORIDE CRYS ER 20 MEQ PO TBCR
40.0000 meq | EXTENDED_RELEASE_TABLET | ORAL | Status: AC
  Administered 2019-11-20 (×3): 40 meq via ORAL
  Filled 2019-11-20 (×3): qty 2

## 2019-11-20 MED ORDER — ADULT MULTIVITAMIN W/MINERALS CH
1.0000 | ORAL_TABLET | Freq: Every day | ORAL | Status: DC
  Administered 2019-11-20 – 2019-11-23 (×4): 1 via ORAL
  Filled 2019-11-20 (×4): qty 1

## 2019-11-20 NOTE — Telephone Encounter (Signed)
CSW following pt through [facility]- received consult for pt to put on paramedicine program (was previously on program in 2019 but graduated from the program as he was doing so well).  CSW completed referral and will meet with pt during hospital stay to complete assessment.  Pt has been receiving pharmacy assistance through clinic pharmacist for Sherryll Burger and Xarelto cost concerns- pt approved for [employer] to help with Entresto but still haven't heard back regarding Xarelto assistance.  CSW called [facility] and [facility] who claim they don't have physician portion for patient- CSW found physician portion which was sent with original fax and refaxed to [facility]es and [place] for review.  CSW will continue to follow and assist as needed  [name], LCSW Clinical Social Worker [facility] Desk#: [phone] Cell#: [phone]

## 2019-11-20 NOTE — Progress Notes (Addendum)
Progress Note  Patient Name: [name] [name] Date of Encounter: 11/20/2019  Primary Cardiologist/Electrophysiologist: Dr. Graciela Husbands AHF: Dr. Shirlee Latch   Subjective   Still SOB, no CP  Inpatient Medications    Scheduled Meds:  atorvastatin  80 mg Oral q1800   FLUoxetine  20 mg Oral Daily   influenza vac split quadrivalent PF  0.5 mL Intramuscular Tomorrow-1000   pneumococcal 23 valent vaccine  0.5 mL Intramuscular Tomorrow-1000   potassium chloride  40 mEq Oral BID   rivaroxaban  20 mg Oral Q supper   sacubitril-valsartan  1 tablet Oral BID   sotalol  80 mg Oral Q12H   spironolactone  25 mg Oral Daily   Continuous Infusions:  amiodarone 30 mg/hr (11/20/19 0500)   PRN Meds: acetaminophen, albuterol, tiZANidine   Vital Signs    Vitals:   11/19/19 2058 11/19/19 2224 11/19/19 2317 11/20/19 0345  BP: (!) 113/58  (!) 104/59 104/69  Pulse: 71 78 69 68  Resp: 18 (!) 32 16   Temp: 99.3 F (37.4 C)  98.9 F (37.2 C) (!) 100.8 F (38.2 C)  TempSrc: Oral  Axillary Axillary  SpO2: 93% 96% 92% 97%  Weight: (!) 142.4 kg   (!) 142.1 kg  Height: 6\' 4"  (1.93 m)       Intake/Output Summary (Last 24 hours) at 11/20/2019 0751 Last data filed at 11/20/2019 0500 Gross per 24 hour  Intake 832.76 ml  Output --  Net 832.76 ml   Last 3 Weights 11/20/2019 11/19/2019 11/19/2019  Weight (lbs) 313 lb 4.4 oz 313 lb 15 oz 318 lb  Weight (kg) 142.1 kg 142.4 kg 144.244 kg      Telemetry    SR, V paced, NSVT  Episodes, only 3, longest 14 beats - Personally Reviewed  ECG     discussed below - Personally Reviewed  Physical Exam   GEN: No acute distress.   Neck: ++ JVD Cardiac: RRR, no murmurs, rubs, or gallops.  Respiratory: soft crackles b/l to mid lung. GI: Soft, nontender, non-distended  MS: trace edema; No deformity. Neuro:  Nonfocal  Psych: Normal affect   Labs    High Sensitivity Troponin:   Recent Labs  Lab 11/19/19 1620 11/19/19 1806  TROPONINIHS 18* 20*      Chemistry Recent  Labs  Lab 11/19/19 1620 11/20/19 0531  NA 136 139  K 3.0* 3.2*  CL 99 102  CO2 27 28  GLUCOSE 169* 149*  BUN 10 9  CREATININE 1.18 1.11  CALCIUM 9.0 8.6*  GFRNONAA >60 >60  GFRAA >60 >60  ANIONGAP 10 9     Hematology Recent Labs  Lab 11/19/19 1620  WBC 7.0  RBC 3.33*  HGB 10.8*  HCT 32.7*  MCV 98.2  MCH 32.4  MCHC 33.0  RDW 13.6  PLT 176    BNP Recent Labs  Lab 11/19/19 1806  BNP 964.1*     DDimer No results for input(s): DDIMER in the last 168 hours.   Radiology    DG Chest Portable 1 View  Result Date: 11/19/2019 CLINICAL DATA:  Defibrillator activation EXAM: PORTABLE CHEST 1 VIEW COMPARISON:  10/29/2018 FINDINGS: Stable positioning of a left-sided implanted cardiac device. Low lung volumes. Grossly stable heart size. There is pulmonary vascular congestion and crowding of the central bronchovascular markings, likely accentuated by low lung volumes. No focal airspace consolidation. No large pleural fluid collection. No pneumothorax. IMPRESSION: Low lung volumes with pulmonary vascular congestion. Electronically Signed   By: 12/28/2018 D.O.  On: 11/19/2019 16:37    Cardiac Studies    09/16/2019: TTE IMPRESSIONS   1. Left ventricular ejection fraction, by visual estimation, is 20 to  25%. The left ventricle has severely decreased function. There is  moderately increased left ventricular hypertrophy.   2. Left ventricular diastolic parameters are consistent with Grade II  diastolic dysfunction (pseudonormalization).   3. Severely dilated left ventricular internal cavity size.   4. The left ventricle demonstrates global hypokinesis.   5. Global right ventricle has normal systolic function.The right  ventricular size is normal. No increase in right ventricular wall  thickness.   6. A pacer wire is visualized in the RV.   7. Left atrial size was moderately dilated.   8. Right atrial size was mildly dilated.   9. The mitral valve is normal in  structure. No evidence of mitral valve  regurgitation. No evidence of mitral stenosis.  10. The tricuspid valve is normal in structure.  11. The aortic valve is tricuspid. Aortic valve regurgitation is not  visualized. No evidence of aortic valve sclerosis or stenosis.  12. TR signal is inadequate for assessing pulmonary artery systolic  pressure.  13. The inferior vena cava is normal in size with greater than 50%  respiratory variability, suggesting right atrial pressure of 3 mmHg.    04/08/2015: R/LHC Nonobstructive coronary artery disease. Elevated LVEDP Moderate pulmonary artery hypertension. Cardiac output 5.5 L/min; CI 2.1.  Patient Profile     59 y.o. male w/PMHx of NICM, chronic CHF (systolic), w/CRT-D, VT, Afib, ETOH/tobacco abuse, OSW w/CPAP, HTN Admitted to [facility] after getting shocks from his ICD.  Device information MDT CRT-D, implanted 04/14/2015 + h/o appropriate therapies AAD Current sotalol and amiodarone  Device interrogation ([employer] transmission) from 11/19/2019 Battery and lead measurements are good Presenting rhythm AS/BV pace 2 VT episodes, 14 NSVT  1st VT episode (morphology appears alternans) > ATP (total 4) > unchanged rhythm > accelerated to VF zone > 35J > SR 2nd VT epiosde ATP (total 3) >> accerated to VF zone 35J successful  Assessment & Plan    1. ICD shocks      Appropriate for VT     amio gtt running, continue today     Continue sotalol and ranexa (added yesterdayas well    Nonpaced EKG Qtc with QRS measured 174ms is 564ms Keep K+ >4.0 and mag >2.0  2. Hypokalemia     Replacing further     Also getting aldactone     AHF team will see today to address/manage diuresis further  3. Acute/chronic CHF 4. NICM     Symptoms, CXR and exam suggest volume OL     OptiVol strangely does not, though is below threshold trending upwards     Pt told Dr. Caryl Comes he could not afford [name], will leave decisions on this to AHF team    Initial EKGs and  paced EKGs reviewed with Dr. Caryl Comes Despite CRT pacing, broad QRS Device programmed to extend AV delays and with VS response (trigger) pacing looks a little narrower then no V pacing. Dr. Caryl Comes has reviewed, for now will leave him programmed to allow intrinsic AV conduction and avoid VP   5. PAFib     Very low burden,  None of late     CHA2D2Vasc is 2, on Xarelto     For questions or updates, please contact [place] Please consult [email or web address] for contact info under        Signed, Baldwin Jamaica, PA-C  11/20/2019, 7:51 AM     VT storm  Hypokalemia  CHF acute/chronic systolic  ICD-CRT with paced QRS wider than intrinsic conduction   Not sure of the cause fo the VT whether it is 2/2 CHF with weight up--have seen this before or hypokalemia-- clearly could be both/and  With hypokalemia, will replete first and then begin aggressive diuresis.  Have spoken to Dr DM   Will also turn off the LV lead as his paced QRS is considerably wider 195<<135 msec compared to his intrinsic LBBB; perhaps he will do better w less dyssynchrony   Will discontinue ranolazine for now

## 2019-11-20 NOTE — Consult Note (Addendum)
Advanced Heart Failure Team Consult Note   Primary Physician: [name], MD PCP-Cardiologist:  Dr Shirlee Latch  Reason for Consultation: Heart Failure   HPI:    [name] [name] is seen today for evaluation of heart failure at the request of Dr Shirlee Latch.   [name] [name] is a 59 year old with history chronic systolic heart failure, NICM, BIV ICD, PAF, and VT.   The end of last year he was placed on farxiga but stopped after the second dose because he ICD shock.  Had shock x 2 09/19/19 and 09/25/19. Dr Graciela Husbands has been following and he was placed on amio 200 mg twice a day until 10/20/19.   He was seen in the HF clinic on 10/16/19. At that time he was stable. On 2/2 amiodarone was cut back to 200 mg daily and sotalol was continued.   He was out of potassium, entresto, atorvastatin, and spironolactone. He was unable to afford the co-pay. Drinks a case of beer per week.   Yesterday he was fatigued and had some nausea. Says he took a nap then woke up feeling nauseate and lightheaded. Shortly after he was shocked. Device clinic contacted and he was advised to go to the ED. Device interrogation with appropriate shock for VT.   EP admitted and he was started on  amiodarone drip. CXR with vascular congestion. Pertinent admission labs included: K 3, creatinine 1.2, HS Trop20, TSH 0.7, BNP 964.   Echo 09/16/2019 EF 20-25% RV normal   Review of Systems: [y] = yes, [ ]  = no   . General: Weight gain [ ] ; Weight loss [ ] ; Anorexia [ ] ; Fatigue [ Y]; Fever [ ] ; Chills [ ] ; Weakness [ ]   . Cardiac: Chest pain/pressure [ ] ; Resting SOB [ ] ; Exertional SOB [ Y]; Orthopnea [ Y]; Pedal Edema [ ] ; Palpitations [ ] ; Syncope [ ] ; Presyncope [ ] ; Paroxysmal nocturnal dyspnea[ ]   . Pulmonary: Cough [ ] ; Wheezing[ ] ; Hemoptysis[ ] ; Sputum [ ] ; Snoring [ ]   . GI: Vomiting[ ] ; Dysphagia[ ] ; Melena[ ] ; Hematochezia [ ] ; Heartburn[ ] ; Abdominal pain [ ] ; Constipation [ ] ; Diarrhea [ ] ; BRBPR [ ]   . GU: Hematuria[ ] ; Dysuria [  ]; Nocturia[ ]   . Vascular: Pain in legs with walking [ ] ; Pain in feet with lying flat [ ] ; Non-healing sores [ ] ; Stroke [ ] ; TIA [ ] ; Slurred speech [ ] ;  . Neuro: Headaches[ ] ; Vertigo[ ] ; Seizures[ ] ; Paresthesias[ ] ;Blurred vision [ ] ; Diplopia [ ] ; Vision changes [ ]   . Ortho/Skin: Arthritis [ ] ; Joint pain [ Y]; Muscle pain [ ] ; Joint swelling [ ] ; Back Pain [Y ]; Rash [ ]   . Psych: Depression[Y]; Anxiety[ ]   . Heme: Bleeding problems [ ] ; Clotting disorders [ ] ; Anemia [ ]   . Endocrine: Diabetes [ ] ; Thyroid dysfunction[ ]   Home Medications Prior to Admission medications   Medication Sig Start Date End Date Taking? Authorizing Provider  acetaminophen (TYLENOL) 325 MG tablet Take 2 tablets (650 mg total) by mouth every 4 (four) hours as needed for headache or mild pain. 04/15/15  Yes , NP  albuterol (PROVENTIL HFA;VENTOLIN HFA) 108 (90 Base) MCG/ACT inhaler Inhale 1 puff into the lungs every 6 (six) hours as needed for wheezing or shortness of breath. 01/24/17  Yes , NP  amiodarone (PACERONE) 200 MG tablet Take 1 tablet (200 mg total) by mouth 2 (two) times daily. Decrease to 1 tablet (200  mg total) by mouth once daily beginning 10/20/19. 10/08/19  Yes [name], MD  aspirin EC 325 MG tablet Take 1 tablet (325 mg total) by mouth 2 (two) times daily after a meal. Take x 1 month post op to decrease risk of blood clots. 11/04/18  Yes Marshia Ly, PA-C  FLUoxetine (PROZAC) 20 MG tablet Take 20 mg by mouth daily.   Yes [provider]  potassium chloride SA (KLOR-CON) 20 MEQ tablet Take 2 tablets (40 mEq total) by mouth 2 (two) times daily. 10/16/19  Yes Clegg, Amy D, NP  rivaroxaban (XARELTO) 20 MG TABS tablet Take 1 tablet (20 mg total) by mouth daily with supper. 06/13/19  Yes Clegg, Amy D, NP  sacubitril-valsartan (ENTRESTO) 49-51 MG Take 1 tablet by mouth 2 (two) times daily. 06/10/18  Yes [name], MD  sotalol (BETAPACE) 80 MG tablet TAKE 1  TABLET BY MOUTH EVERY 12 HOURS Patient taking differently: Take 80 mg by mouth daily.  09/29/19  Yes [name], MD  torsemide (DEMADEX) 20 MG tablet TAKE 3 TABLETS BY MOUTH TWO TIMES DAILY Patient taking differently: Take 60 mg by mouth daily.  11/19/19  Yes [name], MD  atorvastatin (LIPITOR) 80 MG tablet TAKE 1 TABLET BY MOUTH ONCE DAILY AT  6  PM Patient not taking: Reported on 11/19/2019 09/16/19   [name], MD  dapagliflozin propanediol (FARXIGA) 10 MG TABS tablet Take 10 mg by mouth daily before breakfast. Patient not taking: Reported on 11/19/2019 09/16/19   [name], MD  spironolactone (ALDACTONE) 25 MG tablet TAKE 1 TABLET BY MOUTH ONCE DAILY (NEED  OFFICE  VISIT  FOR  FUTURE  REFILLS) Patient not taking: Reported on 11/19/2019 10/16/19   Tonye Becket D, NP  tiZANidine (ZANAFLEX) 2 MG tablet Take 1 tablet (2 mg total) by mouth every 8 (eight) hours as needed for muscle spasms. Patient not taking: Reported on 11/19/2019 11/04/18   Marshia Ly, PA-C    Past Medical History: Past Medical History:  Diagnosis Date  . AICD (automatic cardioverter/defibrillator) present   . Arthritis   . CAD (coronary artery disease)    Mild nonobstructive (Cath 09)  . Chronic systolic heart failure (HCC)    Nonischemic CM: echo 4/12 with EF 25-30%, grade 2 diast dysfxn, mild dilated aortic root 43 mm, trivial [name], mod LAE  . COPD (chronic obstructive pulmonary disease) (HCC)   . Dyspnea   . H/O ETOH abuse   . HTN (hypertension)   . Hypersomnia   . Obesity, morbid (HCC)   . OSA on CPAP   . Systolic and diastolic CHF, acute on chronic (HCC) 11/2016  . Tobacco use disorder   . Ventricular tachycardia [facility])     Past Surgical History: Past Surgical History:  Procedure Laterality Date  . CARDIAC CATHETERIZATION N/A 04/08/2015   Procedure: Right/Left Heart Cath and Coronary Angiography;  Surgeon: Corky Crafts, MD;  Location: [facility] INVASIVE CV LAB;  Service: Cardiovascular;   Laterality: N/A;  . EP IMPLANTABLE DEVICE N/A 04/14/2015   Procedure: BiV ICD Insertion CRT-D;  Surgeon: [name], MD;  Location: [facility] INVASIVE CV LAB;  Service: Cardiovascular;  Laterality: N/A;  . HYDROCELE EXCISION / REPAIR    . TOTAL HIP ARTHROPLASTY Right 11/04/2018   Procedure: TOTAL HIP ARTHROPLASTY ANTERIOR APPROACH;  Surgeon: [name], MD;  Location: [facility] ORS;  Service: Orthopedics;  Laterality: Right;  . TUMOR EXCISION Left   . 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 VASECTOMY      Family History: Family History  Problem Relation Age of Onset  . Other Mother        cardiac surgery. late 1990s  . Heart Problems Mother        CABG AGE 80  . Congestive Heart Failure Father   . Healthy Father        AGE 3  . Coronary artery disease Other   . Healthy Brother        AGE 72  . Healthy Sister        AGE 39  . Healthy Sister        AGE 81  . Healthy Son   . Healthy Son   . Healthy Daughter     Social History: Social History   Socioeconomic History  . Marital status: Divorced    Spouse name: Not on file  . Number of children: 2  . Years of education: Not on file  . Highest education level: Not on file  Occupational History  . Occupation: Copy  . Occupation: disabled, odd jobs  Tobacco Use  . Smoking status: Former Smoker    Packs/day: 0.50    Years: 4.00    Pack years: 2.00    Types: Cigarettes    Quit date: 10/29/2005    Years since quitting: 14.0  . Smokeless tobacco: Never Used  . Tobacco comment: about a pack a week. social  Substance and Sexual Activity  . Alcohol use: Yes    Alcohol/week: 42.0 standard drinks    Types: 42 Cans of beer per week    Comment: 24 a week  . Drug use: Yes    Frequency: 7.0 times per week    Types: Marijuana    Comment: has discontinued  . Sexual activity: Yes    Partners: Female  Other Topics Concern  . Not on file  Social History Narrative   ** Merged History Encounter **        Social Determinants of Health   Financial Resource Strain:   . Difficulty of Paying Living Expenses: Not on file  Food Insecurity:   . Worried About Programme researcher, broadcasting/film/video in the Last Year: Not on file  . Ran Out of Food in the Last Year: Not on file  Transportation Needs:   . Lack of Transportation (Medical): Not on file  . Lack of Transportation (Non-Medical): Not on file  Physical Activity:   . Days of Exercise per Week: Not on file  . Minutes of Exercise per Session: Not on file  Stress:   . Feeling of Stress : Not on file  Social Connections:   . Frequency of Communication with Friends and Family: Not on file  . Frequency of Social Gatherings with Friends and Family: Not on file  . Attends Religious Services: Not on file  . Active Member of Clubs or Organizations: Not on file  . Attends Banker Meetings: Not on file  . Marital Status: Not on file    Allergies:  No Known Allergies  Objective:    Vital Signs:   Temp:  [98.3 F (36.8 C)-100.8 F (38.2 C)] 100.8 F (38.2 C) (03/04 0345) Pulse Rate:  [52-78] 68 (03/04 0345) Resp:  [10-32] 16 (03/03 2317) BP: (99-140)/(50-72) 104/69 (03/04 0345) SpO2:  [92 %-100 %] 97 % (03/04 0345) Weight:  [142.1 kg-144.2 kg] 142.1 kg (03/04 0345) Last BM Date: 11/18/19  Weight change: Ceasar Mons  Weights   11/19/19 1625 11/19/19 2058 11/20/19 0345  Weight: (!) 144.2 kg (!) 142.4 kg (!) 142.1 kg    Intake/Output:   Intake/Output Summary (Last 24 hours) at 11/20/2019 0948 Last data filed at 11/20/2019 0500 Gross per 24 hour  Intake 832.76 ml  Output --  Net 832.76 ml      Physical Exam    General:  Tearful.  No resp difficulty HEENT: normal Neck: supple. JVP 11-12  . Carotids 2+ bilat; no bruits. No lymphadenopathy or thyromegaly appreciated. Cor: PMI nondisplaced. Regular rate & rhythm. No rubs, gallops or murmurs. Lungs: clear Abdomen: soft, nontender, distended. No hepatosplenomegaly. No bruits or masses. Good  bowel sounds. Extremities: no cyanosis, clubbing, rash, edema Neuro: alert & orientedx3, cranial nerves grossly intact. moves all 4 extremities w/o difficulty. Affect pleasant   Telemetry   A sensed V paced 70-80s with NSVT   EKG    A sensed V paced 70 bpm QRS > 150  Labs   Basic Metabolic Panel: Recent Labs  Lab 11/19/19 1620 11/20/19 0531  NA 136 139  K 3.0* 3.2*  CL 99 102  CO2 27 28  GLUCOSE 169* 149*  BUN 10 9  CREATININE 1.18 1.11  CALCIUM 9.0 8.6*  MG 2.1  --     Liver Function Tests: No results for input(s): AST, ALT, ALKPHOS, BILITOT, PROT, ALBUMIN in the last 168 hours. No results for input(s): LIPASE, AMYLASE in the last 168 hours. No results for input(s): AMMONIA in the last 168 hours.  CBC: Recent Labs  Lab 11/19/19 1620  WBC 7.0  NEUTROABS 6.0  HGB 10.8*  HCT 32.7*  MCV 98.2  PLT 176    Cardiac Enzymes: No results for input(s): CKTOTAL, CKMB, CKMBINDEX, TROPONINI in the last 168 hours.  BNP: BNP (last 3 results) Recent Labs    11/19/19 1806  BNP 964.1*    ProBNP (last 3 results) No results for input(s): PROBNP in the last 8760 hours.   CBG: No results for input(s): GLUCAP in the last 168 hours.  Coagulation Studies: No results for input(s): LABPROT, INR in the last 72 hours.   Imaging   DG Chest Portable 1 View  Result Date: 11/19/2019 CLINICAL DATA:  Defibrillator activation EXAM: PORTABLE CHEST 1 VIEW COMPARISON:  10/29/2018 FINDINGS: Stable positioning of a left-sided implanted cardiac device. Low lung volumes. Grossly stable heart size. There is pulmonary vascular congestion and crowding of the central bronchovascular markings, likely accentuated by low lung volumes. No focal airspace consolidation. No large pleural fluid collection. No pneumothorax. IMPRESSION: Low lung volumes with pulmonary vascular congestion. Electronically Signed   By: Davina Poke D.O.   On: 11/19/2019 16:37      Medications:     Current  Medications: . atorvastatin  80 mg Oral q1800  . FLUoxetine  20 mg Oral Daily  . influenza vac split quadrivalent PF  0.5 mL Intramuscular Tomorrow-1000  . pneumococcal 23 valent vaccine  0.5 mL Intramuscular Tomorrow-1000  . potassium chloride  40 mEq Oral Q3H  . rivaroxaban  20 mg Oral Q supper  . sacubitril-valsartan  1 tablet Oral BID  . sotalol  80 mg Oral Q12H  . spironolactone  25 mg Oral Daily     Infusions: . amiodarone 30 mg/hr (11/20/19 0500)        Assessment/Plan   1. VT - suspect hypokalemia was a factor. He has been out of potassium and entresto.  Recent shocks 1/1, 1/4, and 3/3.  -Supp K  -  Mag 2.1 -On amio drip + sotalol.  - No driving 6 months    2. Chronic Systolic Heart Failure  ECHO 08/2019 EF 20-25% RV normal.  BiV LV lead was turned off. Some question about revision? NYHA III. Volume overloaded. Give 80 mg IV lasix twice daily.  - Supp K .  - Continue sotalol. Intolerant coreg due to hypotension -Continue entresto 49-51 mg twice a day  - Continue spiro 25 mg daily -  - Follow daily BMET   3. Hypokalemia  -K being supplemented.   4. ETOH abuse -Ongoing heavy drinker  - Discussed cessation.   5. OSA Continue CPAP   6. LBBB EP following for possible revision.   7. PAF  -In NSR today. On amio drip.  - On xarelto  8. ID;  -Temp 100.8  -WBC normal  - Check blood cultures.   Refer to HF Paramedicine. Prior to admit he was not taking spiro, potassium, and entresto.   Medication concerns reviewed with patient and pharmacy team. Barriers identified: Cost for medications. Needs meds prior to discharge.   Length of Stay: 1  Amy Clegg, NP  11/20/2019, 9:48 AM  Advanced Heart Failure Team Pager [phone] (M-F; 7a - 4p)  Please contact [facility] for night-coverage after hours (4p -7a ) and weekends on [email or web address]  Patient seen with NP, agree with the above note.   Patient was admitted VT and CHF exacerbation.  He had been out of  Entresto and spironolactone, out of K.  Was hypokalemic at admission.    Now on IV amiodarone and getting K replaced. No further VT.   Wide QRS with BiV pacing, LV lead turned off by Dr. Graciela Husbands today, QRS still wide around 174 msec.   General: NAD Neck: JVP 14+ cm, no thyromegaly or thyroid nodule.  Lungs: Clear to auscultation bilaterally with normal respiratory effort. CV: Nondisplaced PMI.  Heart regular S1/S2, no S3/S4, no murmur.  1+ ankle edema.  No carotid bruit.  Normal pedal pulses.  Abdomen: Soft, nontender, no hepatosplenomegaly, moderate distention.  Skin: Intact without lesions or rashes.  Neurologic: Alert and oriented x 3.  Psych: Normal affect. Extremities: No clubbing or cyanosis.  HEENT: Normal.  1. VT: Admitted with recurrent VT in setting of CHF exacerbation.  Long history of VT.  He has not been taking K and was hypokalemic.  - Replace K.  - Amiodarone IV for now.  - Continue sotalol.  - ?VT ablation in future.  2. Acute on chronic systolic CHF: NICM, Echo 12/2016 EF 15%. Normal coronaries on cath in July 2016. [employer] CRT-D device. ETOH vs HTN vs myocarditis as cause of cardiomyopathy. Echo in 12/20 showed that EF remains 20-25%.  He has been out of Entresto and spironolactone.  He did not tolerate [place].  He has been taking torsemide.  Weight is up about 20 lbs and he is clearly volume overloaded on exam.   - Replace K and start Lasix 80 mg IV bid.  - Restart Entresto 49-51 mg twice a day and spironolactone 25 mg daily.  - Intolerant to Coreg in the past with hypotension, he is on sotalol and amiodarone.  - He needs to cut back on ETOH as he is still drinking fairly heavily.  - QRS is wide despite BiV pacing, EP has turned off LV lead.  Still with wide QRS (174 msec).  I wonder if there would be utility to repositioning the lead versus left bundle pacing.  3. Paroxysmal atrial fibrillation:  He is in NSR today.  - Continue amiodarone.   - Continue Xarelto 20 mg  daily.  4. ETOH abuse: Suspect this contributes to cardiomyopathy.  Still drinking heavily, I strongly encouraged him to quit.   5. Tobacco abuse: No longer smoking.   6. OSA: Continue nightly CPAP.   Marca Ancona 11/20/2019 10:47 AM

## 2019-11-20 NOTE — Plan of Care (Signed)

## 2019-11-21 ENCOUNTER — Other Ambulatory Visit (HOSPITAL_COMMUNITY): Payer: Self-pay

## 2019-11-21 LAB — BASIC METABOLIC PANEL
Anion gap: 9 (ref 5–15)
BUN: 9 mg/dL (ref 6–20)
CO2: 28 mmol/L (ref 22–32)
Calcium: 8.8 mg/dL — ABNORMAL LOW (ref 8.9–10.3)
Chloride: 101 mmol/L (ref 98–111)
Creatinine, Ser: 1 mg/dL (ref 0.61–1.24)
GFR calc Af Amer: >60 mL/min (ref >60)
GFR calc non Af Amer: >60 mL/min (ref >60)
Glucose, Bld: 144 mg/dL — ABNORMAL HIGH (ref 70–99)
Potassium: 3.4 mmol/L — ABNORMAL LOW (ref 3.5–5.1)
Sodium: 138 mmol/L (ref 135–145)

## 2019-11-21 LAB — MAGNESIUM: Magnesium: 2.1 mg/dL (ref 1.7–2.4)

## 2019-11-21 MED ORDER — POTASSIUM CHLORIDE 20 MEQ/15ML (10%) PO SOLN
40.0000 meq | ORAL | Status: AC
  Administered 2019-11-21 (×4): 40 meq via ORAL
  Filled 2019-11-21 (×4): qty 30

## 2019-11-21 MED ORDER — AMIODARONE HCL 200 MG PO TABS
400.0000 mg | ORAL_TABLET | Freq: Two times a day (BID) | ORAL | Status: DC
  Administered 2019-11-21 – 2019-11-23 (×5): 400 mg via ORAL
  Filled 2019-11-21 (×5): qty 2

## 2019-11-21 MED ORDER — RIVAROXABAN 20 MG PO TABS: 20.0000 mg | ORAL_TABLET | Freq: Every day | ORAL | 0 refills | Status: DC

## 2019-11-21 NOTE — Progress Notes (Addendum)
Progress Note  Patient Name: [name] [name] Date of Encounter: 11/21/2019  Primary Cardiologist/Electrophysiologist: Dr. Graciela Husbands AHF: Dr. Shirlee Latch   Subjective   Feeling better this AM, much less SOB, was able to sleep last night, still with some orthopnea, no CP  Inpatient Medications    Scheduled Meds: . atorvastatin  80 mg Oral q1800  . FLUoxetine  20 mg Oral Daily  . folic acid  1 mg Oral Daily  . furosemide  80 mg Intravenous BID  . influenza vac split quadrivalent PF  0.5 mL Intramuscular Tomorrow-1000  . multivitamin with minerals  1 tablet Oral Daily  . pneumococcal 23 valent vaccine  0.5 mL Intramuscular Tomorrow-1000  . potassium chloride  40 mEq Oral Q4H  . rivaroxaban  20 mg Oral Q supper  . sacubitril-valsartan  1 tablet Oral BID  . sotalol  80 mg Oral Q12H  . spironolactone  25 mg Oral Daily  . thiamine  100 mg Oral Daily   Continuous Infusions: . amiodarone 30 mg/hr (11/21/19 0023)   PRN Meds: acetaminophen, albuterol, tiZANidine   Vital Signs    Vitals:   11/21/19 0036 11/21/19 0322 11/21/19 0604 11/21/19 0631  BP:  (!) 100/57    Pulse: 61     Resp: (!) 21 (!) 25  13  Temp: 99.9 F (37.7 C) 98.2 F (36.8 C)    TempSrc: Axillary Oral    SpO2:  100%    Weight:   (!) 139.9 kg   Height:        Intake/Output Summary (Last 24 hours) at 11/21/2019 0723 Last data filed at 11/20/2019 2116 Gross per 24 hour  Intake 1260 ml  Output 2240 ml  Net -980 ml   Last 3 Weights 11/21/2019 11/20/2019 11/19/2019  Weight (lbs) 308 lb 6.8 oz 313 lb 4.4 oz 313 lb 15 oz  Weight (kg) 139.9 kg 142.1 kg 142.4 kg      Telemetry    SR, V paced, NSVT  Episodes,  longest 3 beats - Personally Reviewed  ECG     no new EKGs - Personally Reviewed  Physical Exam   GEN: No acute distress.   Neck: ++ JVD Cardiac: RRR, no murmurs, rubs, or gallops.  Respiratory: soft crackles L, much better pul exam today. GI: Soft, nontender, non-distended  MS: no edema; No deformity. Neuro:   Nonfocal  Psych: Normal affect   Labs    High Sensitivity Troponin:   Recent Labs  Lab 11/19/19 1620 11/19/19 1806  TROPONINIHS 18* 20*      Chemistry Recent Labs  Lab 11/19/19 1620 11/19/19 1620 11/20/19 0531 11/20/19 1342 11/21/19 0159  NA 136  --  139  --  138  K 3.0*   < > 3.2* 3.5 3.4*  CL 99  --  102  --  101  CO2 27  --  28  --  28  GLUCOSE 169*  --  149*  --  144*  BUN 10  --  9  --  9  CREATININE 1.18  --  1.11  --  1.00  CALCIUM 9.0  --  8.6*  --  8.8*  GFRNONAA >60  --  >60  --  >60  GFRAA >60  --  >60  --  >60  ANIONGAP 10  --  9  --  9   < > = values in this interval not displayed.     Hematology Recent Labs  Lab 11/19/19 1620  WBC 7.0  RBC 3.33*  HGB 10.8*  HCT 32.7*  MCV 98.2  MCH 32.4  MCHC 33.0  RDW 13.6  PLT 176    BNP Recent Labs  Lab 11/19/19 1806  BNP 964.1*     DDimer No results for input(s): DDIMER in the last 168 hours.   Radiology    DG Chest Portable 1 View Result Date: 11/19/2019 CLINICAL DATA:  Defibrillator activation EXAM: PORTABLE CHEST 1 VIEW COMPARISON:  10/29/2018 FINDINGS: Stable positioning of a left-sided implanted cardiac device. Low lung volumes. Grossly stable heart size. There is pulmonary vascular congestion and crowding of the central bronchovascular markings, likely accentuated by low lung volumes. No focal airspace consolidation. No large pleural fluid collection. No pneumothorax. IMPRESSION: Low lung volumes with pulmonary vascular congestion. Electronically Signed   By: Davina Poke D.O.   On: 11/19/2019 16:37    Cardiac Studies    09/16/2019: TTE IMPRESSIONS   1. Left ventricular ejection fraction, by visual estimation, is 20 to  25%. The left ventricle has severely decreased function. There is  moderately increased left ventricular hypertrophy.   2. Left ventricular diastolic parameters are consistent with Grade II  diastolic dysfunction (pseudonormalization).   3. Severely dilated left  ventricular internal cavity size.   4. The left ventricle demonstrates global hypokinesis.   5. Global right ventricle has normal systolic function.The right  ventricular size is normal. No increase in right ventricular wall  thickness.   6. A pacer wire is visualized in the RV.   7. Left atrial size was moderately dilated.   8. Right atrial size was mildly dilated.   9. The mitral valve is normal in structure. No evidence of mitral valve  regurgitation. No evidence of mitral stenosis.  10. The tricuspid valve is normal in structure.  11. The aortic valve is tricuspid. Aortic valve regurgitation is not  visualized. No evidence of aortic valve sclerosis or stenosis.  12. TR signal is inadequate for assessing pulmonary artery systolic  pressure.  13. The inferior vena cava is normal in size with greater than 50%  respiratory variability, suggesting right atrial pressure of 3 mmHg.    04/08/2015: R/LHC  Nonobstructive coronary artery disease.  Elevated LVEDP  Moderate pulmonary artery hypertension.  Cardiac output 5.5 L/min; CI 2.1.  Patient Profile     59 y.o. male w/PMHx of NICM, chronic CHF (systolic), w/CRT-D, VT, Afib, ETOH/tobacco abuse, OSW w/CPAP, HTN Admitted to [facility] after getting shocks from his ICD.  Device information MDT CRT-D, implanted 04/14/2015 + h/o appropriate therapies AAD Current sotalol and amiodarone  Device interrogation ([employer] transmission) from 11/19/2019 Battery and lead measurements are good Presenting rhythm AS/BV pace 2 VT episodes, 14 NSVT  1st VT episode (morphology appears alternans) > ATP (total 4) > unchanged rhythm > accelerated to VF zone > 35J > SR 2nd VT epiosde ATP (total 3) >> accerated to VF zone 35J successful  Assessment & Plan    1. ICD shocks      Appropriate for VT storm     amio gtt running, likely plan to transition to PO, though remains hypokalemic despite replacement yesterday, may leave it until we have a better handle  on his K+     Continue sotalol   Nonpaced EKG Qtc with QRS measured 176ms is 552ms Keep K+ >4.0 and mag >2.0  2. Hypokalemia     Remains, is already being addressed this AM     Also getting aldactone 3. Acute/chronic CHF 4. NICM     Symptoms, CXR  and exam suggest volume OL     OptiVol strangely does not, though is below threshold trending upwards     Appreciate HF team  Fluid neg yesterday Weight down 5lbs Creat stable    Will discuss with Dr.Anasophia Pecor, perhaps with MDT rep, try to optimize LV pacing, VS response pacing ECG yesterday had slightly narrower QRS duration.    5. PAFib     Very low burden,  None of late     CHA2D2Vasc is 2, on Xarelto        For questions or updates, please contact [facility] Please consult [email or web address] for contact info under        Signed, Sheilah Pigeon, PA-C  11/21/2019, 7:23 AM     As above.  Device reprogrammed with V sensed response programmed with an AV delay of 200 ms resulting in a shortening of the QRS duration from about 165--142 ms.  At this point would transition his amiodarone to p.o. and continue aggressive diuresis.

## 2019-11-21 NOTE — Progress Notes (Signed)
Patient ID: [name] [name], male   DOB: [birth year], 59 y.o.   MRN: [id number]     Advanced Heart Failure Rounding Note  PCP-Cardiologist: No primary care provider on file.   Subjective:    Patient diuresed well yesterday, feeling better.  Weight down almost 5 lbs.  No VT, having occasional PVCs.    Objective:   Weight Range: (!) 139.9 kg Body mass index is 37.54 kg/m.   Vital Signs:   Temp:  [98 F (36.7 C)-99.9 F (37.7 C)] 98.9 F (37.2 C) (03/05 0704) Pulse Rate:  [61-74] 68 (03/05 0704) Resp:  [13-26] 16 (03/05 0704) BP: (100-132)/(57-79) 111/67 (03/05 0704) SpO2:  [91 %-100 %] 95 % (03/05 0704) Weight:  [139.9 kg] 139.9 kg (03/05 0604) Last BM Date: 11/20/19  Weight change: Filed Weights   11/19/19 2058 11/20/19 0345 11/21/19 0604  Weight: (!) 142.4 kg (!) 142.1 kg (!) 139.9 kg    Intake/Output:   Intake/Output Summary (Last 24 hours) at 11/21/2019 0935 Last data filed at 11/21/2019 0900 Gross per 24 hour  Intake 1080 ml  Output 2740 ml  Net -1660 ml      Physical Exam    General:  Well appearing. No resp difficulty HEENT: Normal Neck: Supple. JVP 8-9 cm. Carotids 2+ bilat; no bruits. No lymphadenopathy or thyromegaly appreciated. Cor: PMI nondisplaced. Regular rate & rhythm. No rubs, gallops or murmurs. Lungs: Clear Abdomen: Soft, nontender, nondistended. No hepatosplenomegaly. No bruits or masses. Good bowel sounds. Extremities: No cyanosis, clubbing, rash, edema Neuro: Alert & orientedx3, cranial nerves grossly intact. moves all 4 extremities w/o difficulty. Affect pleasant   Telemetry   NSR, occasional PVCs (personally reviewed)  Labs    CBC Recent Labs    11/19/19 1620  WBC 7.0  NEUTROABS 6.0  HGB 10.8*  HCT 32.7*  MCV 98.2  PLT 176   Basic Metabolic Panel Recent Labs    03/13/93 1620 11/19/19 1620 11/20/19 0531 11/20/19 0531 11/20/19 1342 11/21/19 0159  NA 136   < > 139  --   --  138  K 3.0*   < > 3.2*   < > 3.5 3.4*  CL 99   <  > 102  --   --  101  CO2 27   < > 28  --   --  28  GLUCOSE 169*   < > 149*  --   --  144*  BUN 10   < > 9  --   --  9  CREATININE 1.18   < > 1.11  --   --  1.00  CALCIUM 9.0   < > 8.6*  --   --  8.8*  MG 2.1  --   --   --   --  2.1   < > = values in this interval not displayed.   Liver Function Tests No results for input(s): AST, ALT, ALKPHOS, BILITOT, PROT, ALBUMIN in the last 72 hours. No results for input(s): LIPASE, AMYLASE in the last 72 hours. Cardiac Enzymes No results for input(s): CKTOTAL, CKMB, CKMBINDEX, TROPONINI in the last 72 hours.  BNP: BNP (last 3 results) Recent Labs    11/19/19 1806  BNP 964.1*    ProBNP (last 3 results) No results for input(s): PROBNP in the last 8760 hours.   D-Dimer No results for input(s): DDIMER in the last 72 hours. Hemoglobin A1C No results for input(s): HGBA1C in the last 72 hours. Fasting Lipid Panel No results for input(s): CHOL, HDL, LDLCALC,  TRIG, CHOLHDL, LDLDIRECT in the last 72 hours. Thyroid Function Tests Recent Labs    11/19/19 1806  TSH 0.701    Other results:   Imaging     No results found.   Medications:     Scheduled Medications: . atorvastatin  80 mg Oral q1800  . FLUoxetine  20 mg Oral Daily  . folic acid  1 mg Oral Daily  . furosemide  80 mg Intravenous BID  . multivitamin with minerals  1 tablet Oral Daily  . potassium chloride  40 mEq Oral Q4H  . rivaroxaban  20 mg Oral Q supper  . sacubitril-valsartan  1 tablet Oral BID  . sotalol  80 mg Oral Q12H  . spironolactone  25 mg Oral Daily  . thiamine  100 mg Oral Daily     Infusions: . amiodarone 30 mg/hr (11/21/19 0023)     PRN Medications:  acetaminophen, albuterol, tiZANidine   Assessment/Plan   1. VT: Admitted with recurrent VT in setting of CHF exacerbation.  Long history of VT.  He has not been taking K and was hypokalemic.  - Continue to replace K.  - Amiodarone IV for now while diuresing, then back to po.  - Continue  sotalol.  - ?VT ablation in future.  2. Acute on chronic systolic CHF: NICM, Echo 04/2504 EF 15%. Normalcoronaries on cath inJuly 2016. Medtronic CRT-Ddevice.ETOH vs HTN vs myocarditis as cause of cardiomyopathy.Echo in 12/20 showed that EF remains 20-25%. He has been out of Entresto and spironolactone.  He did not tolerate [place].  He has been taking torsemide.  Weight was up about 20 lbs.  He diuresed well yesterday, weight down 5 lbs.  I think that he remains mildly volume overloaded.   - Replace K and give Lasix 80 mg IV bid one more day, likely back to po tomorrow.  Would continue his home regimen of torsemide 60 mg bid, hopefully with restarting Entresto and spironolactone he will maintain euvolemia.  - RestartEntresto 49-51 mg twice a day and spironolactone 25 mg daily.  Will need assistance getting [name] (working on this).  - Intolerant to Coreg in the past with hypotension, he is on sotalol and amiodarone. - He needs to cut back on ETOH as he is still drinking fairly heavily.  - QRS is wide despite BiV pacing, EP has turned off LV lead.  Still with wide QRS (174 msec native).  Dr. Caryl [name] to try different LV pacing configurations today, if not helpful I wonder if there would be utility to left bundle pacing.  3.Paroxysmal atrial fibrillation: He is in NSR today. - Continue amiodarone.   - Continue Xarelto 20 mg daily.  4. ETOH abuse: Suspect this contributes to cardiomyopathy.Still drinking heavily, I strongly encouraged him to quit. 5. Tobacco abuse:No longer smoking. 6. OSA: Continue nightly CPAP.  Length of Stay: 2  [name] Champagne, MD  11/21/2019, 9:35 AM  Advanced Heart Failure Team Pager [phone] (M-F; [place])  Please contact [facility] for night-coverage after hours (4p -7a ) and weekends on [email or web address]

## 2019-11-21 NOTE — Plan of Care (Signed)
  Problem: Education: Goal: Knowledge of General Education information will improve Description: Including pain rating scale, medication(s)/side effects and non-pharmacologic comfort measures Outcome: Progressing   Problem: Health Behavior/Discharge Planning: Goal: Ability to manage health-related needs will improve Outcome: Progressing   Problem: Clinical Measurements: Goal: Ability to maintain clinical measurements within normal limits will improve Outcome: Progressing Goal: Will remain free from infection Outcome: Progressing Goal: Diagnostic test results will improve Outcome: Progressing Goal: Respiratory complications will improve Outcome: Progressing Goal: Cardiovascular complication will be avoided Outcome: Progressing   Problem: Activity: Goal: Risk for activity intolerance will decrease Outcome: Progressing   Problem: Nutrition: Goal: Adequate nutrition will be maintained Outcome: Progressing   Problem: Coping: Goal: Level of anxiety will decrease Outcome: Progressing   Problem: Elimination: Goal: Will not experience complications related to bowel motility Outcome: Progressing Goal: Will not experience complications related to urinary retention Outcome: Progressing   Problem: Pain Managment: Goal: General experience of comfort will improve Outcome: Progressing   Problem: Safety: Goal: Ability to remain free from injury will improve Outcome: Progressing   Problem: Skin Integrity: Goal: Risk for impaired skin integrity will decrease Outcome: Progressing   Problem: Education: Goal: Ability to manage disease process will improve Outcome: Progressing Goal: Individualized Educational Video(s) Outcome: Progressing   Problem: Cardiac: Goal: Ability to achieve and maintain adequate cardiopulmonary perfusion will improve Outcome: Progressing   Problem: Education: Goal: Ability to demonstrate management of disease process will improve Outcome:  Progressing Goal: Ability to verbalize understanding of medication therapies will improve Outcome: Progressing Goal: Individualized Educational Video(s) Outcome: Progressing   Problem: Activity: Goal: Capacity to carry out activities will improve Outcome: Progressing   Problem: Cardiac: Goal: Ability to achieve and maintain adequate cardiopulmonary perfusion will improve Outcome: Progressing

## 2019-11-21 NOTE — Progress Notes (Signed)
Paramedicine Initial Assessment:  Housing:  In what kind of housing do you live? House/apt/trailer/shelter? house  Do you rent/pay a mortgage/own? own  Do you live with anyone? Lives with his daughter but also lives on family land which includes several siblings and his parents all which a small area.  Are you currently worried about losing your housing? no  Within the past 12 months have you ever stayed outside, in a car, tent, a shelter, or temporarily with someone? no  Within the past 12 months have you been unable to get utilities when it was really needed? no  Social:  What is your current marital status? single  Do you have any children? yes  Do you have family or friends who live locally? Yes all live on the same land.  Food:  Within the past 12 months were you ever worried that food would run out before you got money to buy more? no  Within the past 15months have you run out of food and didn't have money to buy more?no  But admits to be financially strained so CSW provided with food stamps as he has never attempted to apply before.  Income:  What is your current source of income? SSDI  How hard is it for you to pay for the basics like food housing, medical care, and utilities? Somewhat hard  Do you have outstanding medical bills? None reported  Insurance:  Are you currently insured? yes  Do you have prescription coverage? yes   Transportation:  Do you have transportation to your medical appointments? yes   If yes, how? Drives self- has his own car.  In the past 12 months has lack of transportation kept you from medical appts or from getting medications? no  In the past 12 months has lack of transportation kept you from meetings, work, or getting things you needed? no   Daily Health Needs: Do you have a working scale at home? yes  How do you manage your medications at home? Uses pill box  Do you ever take your medications differently than prescribed? Yes  but usually not intentionally but does admit stopping some meds when he thinks hes better- understands this is not a good idea.  Specifically stopped his prozac and is now depressed again.  Do you have issues affording your medications? Yes- specifically Xarelto and Sherryll Burger- now has [employer] for his Sherryll Burger until end of this year and we are waiting on [place] and [employer] [place] for his Xarelto.  CSW also helped pt apply for Extra Help program about a month ago- pt reports seeing nothing from [facility] yet regarding this application- requested he check when he gets home as it is usually about a month turn around  If yes, has this ever prevented you from obtaining medications? yes  Do you have any concerns with mobility at home? no  Do you use any assistive devices at home or have PCS at home? Has a walker and a cane from a previous hip fracture but no longer needs.  Do you have a PCP? Sees Dr. Windle Guard   Are there any additional barriers you see to getting the care you need?  Admits to depression- reports he was on prozac through his PCP for awhile and states this helped him significantly but he stopped taking it because he didn't get to his PCP to give him a refill.  He is now pretty depressed and admits crying daily.  CSW discussed plan to get pt back on Prozac  this admission and messaged inpatient [facility] team to make sure it is filled in [place] prior to DC so he can begin taking it again before his post hospital DC appt with his PCP.  Discussed potential counseling and will follow up regarding this at next appt after seeing how he is feeling back on prozac.  CSW will continue to follow through paramedicine program and assist as needed.   Jorge Ny, LCSW Clinical Social Worker Advanced Heart Failure Clinic Desk#: [phone] Cell#: [phone]

## 2019-11-22 LAB — BASIC METABOLIC PANEL
Anion gap: 8 (ref 5–15)
BUN: 13 mg/dL (ref 6–20)
CO2: 29 mmol/L (ref 22–32)
Calcium: 9.3 mg/dL (ref 8.9–10.3)
Chloride: 101 mmol/L (ref 98–111)
Creatinine, Ser: 1.02 mg/dL (ref 0.61–1.24)
GFR calc Af Amer: >60 mL/min (ref >60)
GFR calc non Af Amer: >60 mL/min (ref >60)
Glucose, Bld: 149 mg/dL — ABNORMAL HIGH (ref 70–99)
Potassium: 4.3 mmol/L (ref 3.5–5.1)
Sodium: 138 mmol/L (ref 135–145)

## 2019-11-22 LAB — MAGNESIUM: Magnesium: 2.2 mg/dL (ref 1.7–2.4)

## 2019-11-22 MED ORDER — TORSEMIDE 20 MG PO TABS
60.0000 mg | ORAL_TABLET | Freq: Two times a day (BID) | ORAL | Status: DC
  Administered 2019-11-22 – 2019-11-23 (×2): 60 mg via ORAL
  Filled 2019-11-22 (×2): qty 3

## 2019-11-22 NOTE — Progress Notes (Signed)
Patient ID: [name] [name], male   DOB: 06/30/1961, 59 y.o.   MRN: [id number]     Advanced Heart Failure Rounding Note  PCP-Cardiologist: No primary care provider on file.   Subjective:    Mild diuresis overnight on IV Lasix.  Weight down 1 pound.  No further VT.  Denies chest pain or shortness of breath. Anxious to go home   Objective:   Weight Range: (!) 139.7 kg Body mass index is 37.49 kg/m.   Vital Signs:   Temp:  [98 F (36.7 C)-99.1 F (37.3 C)] 98.6 F (37 C) (03/06 1221) Pulse Rate:  [63-79] 63 (03/06 1221) Resp:  [16-20] 20 (03/06 1221) BP: (97-119)/(60-67) 104/60 (03/06 1221) SpO2:  [92 %-98 %] 98 % (03/06 1221) Weight:  [139.7 kg] 139.7 kg (03/06 0234) Last BM Date: 11/22/19  Weight change: Filed Weights   11/20/19 0345 11/21/19 0604 11/22/19 0234  Weight: (!) 142.1 kg (!) 139.9 kg (!) 139.7 kg    Intake/Output:   Intake/Output Summary (Last 24 hours) at 11/22/2019 1500 Last data filed at 11/22/2019 1221 Gross per 24 hour  Intake 1252.2 ml  Output 2500 ml  Net -1247.8 ml      Physical Exam    General:  Obese male lying in bed . No resp difficulty HEENT: normal Neck: supple. no JVD. Carotids 2+ bilat; no bruits. No lymphadenopathy or thryomegaly appreciated. Cor: PMI nondisplaced. Regular rate & rhythm. No rubs, gallops or murmurs. Lungs: clear Abdomen: obese soft, nontender, nondistended. No hepatosplenomegaly. No bruits or masses. Good bowel sounds. Extremities: no cyanosis, clubbing, rash, edema Neuro: alert & orientedx3, cranial nerves grossly intact. moves all 4 extremities w/o difficulty. Affect pleasant   Telemetry   NSR with v-pacing 60-70s occasional PVCs (personally reviewed)  Labs    CBC Recent Labs    11/19/19 1620  WBC 7.0  NEUTROABS 6.0  HGB 10.8*  HCT 32.7*  MCV 98.2  PLT 176   Basic Metabolic Panel Recent Labs    44/92/01 0159 11/22/19 0246  NA 138 138  K 3.4* 4.3  CL 101 101  CO2 28 29  GLUCOSE 144* 149*  BUN 9  13  CREATININE 1.00 1.02  CALCIUM 8.8* 9.3  MG 2.1 2.2   Liver Function Tests No results for input(s): AST, ALT, ALKPHOS, BILITOT, PROT, ALBUMIN in the last 72 hours. No results for input(s): LIPASE, AMYLASE in the last 72 hours. Cardiac Enzymes No results for input(s): CKTOTAL, CKMB, CKMBINDEX, TROPONINI in the last 72 hours.  BNP: BNP (last 3 results) Recent Labs    11/19/19 1806  BNP 964.1*    ProBNP (last 3 results) No results for input(s): PROBNP in the last 8760 hours.   D-Dimer No results for input(s): DDIMER in the last 72 hours. Hemoglobin A1C No results for input(s): HGBA1C in the last 72 hours. Fasting Lipid Panel No results for input(s): CHOL, HDL, LDLCALC, TRIG, CHOLHDL, LDLDIRECT in the last 72 hours. Thyroid Function Tests Recent Labs    11/19/19 1806  TSH 0.701    Other results:   Imaging    No results found.   Medications:     Scheduled Medications: . amiodarone  400 mg Oral BID  . atorvastatin  80 mg Oral q1800  . FLUoxetine  20 mg Oral Daily  . folic acid  1 mg Oral Daily  . furosemide  80 mg Intravenous BID  . multivitamin with minerals  1 tablet Oral Daily  . rivaroxaban  20 mg Oral Q supper  .  sacubitril-valsartan  1 tablet Oral BID  . sotalol  80 mg Oral Q12H  . spironolactone  25 mg Oral Daily  . thiamine  100 mg Oral Daily    Infusions:   PRN Medications: acetaminophen, albuterol, tiZANidine   Assessment/Plan   1. VT: Admitted with recurrent VT in setting of CHF exacerbation.  Long history of VT.  He has not been taking K and was hypokalemic.  - Remains in sinus rhythm on p.o. amnio and sotalol.  Discussed with EP this morning. -K and mag look good - ?VT ablation in future.  Discussed with Dr. Curt Bears. 2. Acute on chronic systolic CHF: NICM, Echo 10/8784 EF 15%. Normalcoronaries on cath inJuly 2016. Medtronic CRT-Ddevice.ETOH vs HTN vs myocarditis as cause of cardiomyopathy.Echo in 12/20 showed that EF remains  20-25%. He has been out of Entresto and spironolactone.  He did not tolerate [place].  He has been taking torsemide.  Weight was up about 20 lbs. has diuresed well.  Creatinine stable. -We will switch back to torsemide 60 mg twice daily which is his home dose -He is back on Entresto 49-51 mg twice a day and spironolactone 25 mg daily.  Will need assistance getting [name] (working on this).  - Intolerant to Coreg in the past with hypotension, he is on sotalol and amiodarone. - He needs to cut back on ETOH as he is still drinking fairly heavily.  - QRS is wide despite BiV pacing, EP has turned off LV lead.  Still with wide QRS (174 msec native).  Dr. Caryl Comes to try different LV pacing configurations today, if not helpful I wonder if there would be utility to left bundle pacing.  3.Paroxysmal atrial fibrillation: He is in NSR today. - Continue amiodarone and sotalol.  Discussed with EP. - Continue Xarelto 20 mg daily.  4. ETOH abuse: Suspect this contributes to cardiomyopathy.Still drinking heavily, I strongly encouraged him to quit. 5. Tobacco abuse:No longer smoking. 6. OSA: Continue nightly CPAP.  Length of Stay: 3  [name], MD  11/22/2019, 3:00 PM  Advanced Heart Failure Team Pager [phone] (M-F; 7a - 4p)  Please contact [facility] for night-coverage after hours (4p -7a ) and weekends on [email or web address]

## 2019-11-22 NOTE — Plan of Care (Signed)
  Problem: Clinical Measurements: Goal: Ability to maintain clinical measurements within normal limits will improve Outcome: Progressing Goal: Will remain free from infection Outcome: Progressing Goal: Diagnostic test results will improve Outcome: Progressing Goal: Respiratory complications will improve Outcome: Progressing Goal: Cardiovascular complication will be avoided Outcome: Progressing   Problem: Activity: Goal: Risk for activity intolerance will decrease Outcome: Progressing   Problem: Coping: Goal: Level of anxiety will decrease Outcome: Progressing   Problem: Safety: Goal: Ability to remain free from injury will improve Outcome: Progressing   Problem: Skin Integrity: Goal: Risk for impaired skin integrity will decrease Outcome: Progressing   Problem: Education: Goal: Ability to manage disease process will improve Outcome: Progressing Goal: Individualized Educational Video(s) Outcome: Progressing   Problem: Cardiac: Goal: Ability to achieve and maintain adequate cardiopulmonary perfusion will improve Outcome: Progressing   Problem: Education: Goal: Ability to demonstrate management of disease process will improve Outcome: Progressing Goal: Ability to verbalize understanding of medication therapies will improve Outcome: Progressing Goal: Individualized Educational Video(s) Outcome: Progressing   Problem: Activity: Goal: Capacity to carry out activities will improve Outcome: Progressing   Problem: Cardiac: Goal: Ability to achieve and maintain adequate cardiopulmonary perfusion will improve Outcome: Progressing

## 2019-11-22 NOTE — Progress Notes (Signed)
Progress Note  Patient Name: [name] [name] Date of Encounter: 11/22/2019  Primary Cardiologist/Electrophysiologist: Dr. Graciela Husbands AHF: Dr. Shirlee Latch   Subjective   Continuing to feel well without major complaint today.  He was able to lay mostly flat overnight last night.  Amiodarone drip was stopped yesterday and he was put on p.o. amiodarone.   Inpatient Medications    Scheduled Meds: . amiodarone  400 mg Oral BID  . atorvastatin  80 mg Oral q1800  . FLUoxetine  20 mg Oral Daily  . folic acid  1 mg Oral Daily  . furosemide  80 mg Intravenous BID  . multivitamin with minerals  1 tablet Oral Daily  . rivaroxaban  20 mg Oral Q supper  . sacubitril-valsartan  1 tablet Oral BID  . sotalol  80 mg Oral Q12H  . spironolactone  25 mg Oral Daily  . thiamine  100 mg Oral Daily   Continuous Infusions:  PRN Meds: acetaminophen, albuterol, tiZANidine   Vital Signs    Vitals:   11/21/19 2005 11/21/19 2303 11/22/19 0234 11/22/19 0731  BP: 113/61 119/66 97/66 108/67  Pulse: 79 66 69 65  Resp: 18 20 16 18   Temp: 99.1 F (37.3 C) 98.6 F (37 C) 98.1 F (36.7 C) 98 F (36.7 C)  TempSrc: Oral Oral Oral Oral  SpO2: 96% 96% 98% 95%  Weight:   (!) 139.7 kg   Height:        Intake/Output Summary (Last 24 hours) at 11/22/2019 0858 Last data filed at 11/22/2019 0731 Gross per 24 hour  Intake 892.2 ml  Output 1900 ml  Net -1007.8 ml   Last 3 Weights 11/22/2019 11/21/2019 11/20/2019  Weight (lbs) 307 lb 15.7 oz 308 lb 6.8 oz 313 lb 4.4 oz  Weight (kg) 139.7 kg 139.9 kg 142.1 kg      Telemetry    Sinus rhythm, ventricular paced, PVC-personally reviewed  ECG    Atrial sensed, ventricular paced QRS 142 ms-personally reviewed  Physical Exam   GEN: Well nourished, well developed, in no acute distress  HEENT: normal  Neck: no JVD, carotid bruits, or masses Cardiac: RRR; no murmurs, rubs, or gallops,no edema  Respiratory: Crackles at the bases GI: soft, nontender, nondistended, + BS MS:  no deformity or atrophy  Skin: warm and dry, device site well healed Neuro:  Strength and sensation are intact Psych: euthymic mood, full affect   Labs    High Sensitivity Troponin:   Recent Labs  Lab 11/19/19 1620 11/19/19 1806  TROPONINIHS 18* 20*      Chemistry Recent Labs  Lab 11/20/19 0531 11/20/19 0531 11/20/19 1342 11/21/19 0159 11/22/19 0246  NA 139  --   --  138 138  K 3.2*   < > 3.5 3.4* 4.3  CL 102  --   --  101 101  CO2 28  --   --  28 29  GLUCOSE 149*  --   --  144* 149*  BUN 9  --   --  9 13  CREATININE 1.11  --   --  1.00 1.02  CALCIUM 8.6*  --   --  8.8* 9.3  GFRNONAA >60  --   --  >60 >60  GFRAA >60  --   --  >60 >60  ANIONGAP 9  --   --  9 8   < > = values in this interval not displayed.     Hematology Recent Labs  Lab 11/19/19 1620  WBC 7.0  RBC 3.33*  HGB 10.8*  HCT 32.7*  MCV 98.2  MCH 32.4  MCHC 33.0  RDW 13.6  PLT 176    BNP Recent Labs  Lab 11/19/19 1806  BNP 964.1*     DDimer No results for input(s): DDIMER in the last 168 hours.   Radiology    DG Chest Portable 1 View Result Date: 11/19/2019 CLINICAL DATA:  Defibrillator activation EXAM: PORTABLE CHEST 1 VIEW COMPARISON:  10/29/2018 FINDINGS: Stable positioning of a left-sided implanted cardiac device. Low lung volumes. Grossly stable heart size. There is pulmonary vascular congestion and crowding of the central bronchovascular markings, likely accentuated by low lung volumes. No focal airspace consolidation. No large pleural fluid collection. No pneumothorax. IMPRESSION: Low lung volumes with pulmonary vascular congestion. Electronically Signed   By: Duanne Guess D.O.   On: 11/19/2019 16:37    Cardiac Studies    09/16/2019: TTE IMPRESSIONS   1. Left ventricular ejection fraction, by visual estimation, is 20 to  25%. The left ventricle has severely decreased function. There is  moderately increased left ventricular hypertrophy.   2. Left ventricular diastolic  parameters are consistent with Grade II  diastolic dysfunction (pseudonormalization).   3. Severely dilated left ventricular internal cavity size.   4. The left ventricle demonstrates global hypokinesis.   5. Global right ventricle has normal systolic function.The right  ventricular size is normal. No increase in right ventricular wall  thickness.   6. A pacer wire is visualized in the RV.   7. Left atrial size was moderately dilated.   8. Right atrial size was mildly dilated.   9. The mitral valve is normal in structure. No evidence of mitral valve  regurgitation. No evidence of mitral stenosis.  10. The tricuspid valve is normal in structure.  11. The aortic valve is tricuspid. Aortic valve regurgitation is not  visualized. No evidence of aortic valve sclerosis or stenosis.  12. TR signal is inadequate for assessing pulmonary artery systolic  pressure.  13. The inferior vena cava is normal in size with greater than 50%  respiratory variability, suggesting right atrial pressure of 3 mmHg.    04/08/2015: R/LHC  Nonobstructive coronary artery disease.  Elevated LVEDP  Moderate pulmonary artery hypertension.  Cardiac output 5.5 L/min; CI 2.1.  Patient Profile     59 y.o. male w/PMHx of NICM, chronic CHF (systolic), w/CRT-D, VT, Afib, ETOH/tobacco abuse, OSW w/CPAP, HTN Admitted to [facility] after getting shocks from his ICD.  Device information MDT CRT-D, implanted 04/14/2015 + h/o appropriate therapies AAD Current sotalol and amiodarone  Device interrogation ([employer] transmission) from 11/19/2019 Battery and lead measurements are good Presenting rhythm AS/BV pace 2 VT episodes, 14 NSVT  1st VT episode (morphology appears alternans) > ATP (total 4) > unchanged rhythm > accelerated to VF zone > 35J > SR 2nd VT epiosde ATP (total 3) >> accerated to VF zone 35J successful  Assessment & Plan    1. ICD shocks  ICD shocks were appropriate, patient was in VT storm.  Was put on IV  amiodarone and his sotalol was continued.  He was not taking his Aldactone nor his Entresto and his potassium was quite low.  Potassium has since been repleted and he has been restarted on these medications.  No further VT.   2. Hypokalemia Potassium has come back up to normal.  Nakhi Choi likely need extra potassium at discharge.   3. Acute/chronic CHF 4. NICM Continues to be mildly volume overloaded.  Is net out 1.1  L, though it does appear that he has more diuresis to go.  We Miarose Lippert continue with IV Lasix.      5. PAFib Very low burden.  Remains in sinus rhythm.  Currently on Xarelto for CHA2DS2-VASc of 2.        For questions or updates, please contact Ashley Please consult [email or web address] for contact info under        Signed, Cailynn Bodnar Meredith Leeds, MD  11/22/2019, 8:58 AM

## 2019-11-23 ENCOUNTER — Encounter (HOSPITAL_COMMUNITY): Payer: Self-pay | Admitting: Cardiology

## 2019-11-23 DIAGNOSIS — E876 Hypokalemia: Secondary | ICD-10-CM

## 2019-11-23 DIAGNOSIS — I480 Paroxysmal atrial fibrillation: Secondary | ICD-10-CM

## 2019-11-23 LAB — MAGNESIUM: Magnesium: 1.9 mg/dL (ref 1.7–2.4)

## 2019-11-23 MED ORDER — POTASSIUM CHLORIDE CRYS ER 20 MEQ PO TBCR: 40.0000 meq | EXTENDED_RELEASE_TABLET | Freq: Two times a day (BID) | ORAL | 6 refills | Status: DC

## 2019-11-23 MED ORDER — TORSEMIDE 20 MG PO TABS: 60.0000 mg | ORAL_TABLET | Freq: Two times a day (BID) | ORAL | 6 refills | Status: DC

## 2019-11-23 MED ORDER — SPIRONOLACTONE 25 MG PO TABS: ORAL_TABLET | ORAL | 6 refills | Status: DC

## 2019-11-23 MED ORDER — ENTRESTO 49-51 MG PO TABS: 1.0000 | ORAL_TABLET | Freq: Two times a day (BID) | ORAL | 6 refills | Status: DC

## 2019-11-23 MED ORDER — MAGNESIUM OXIDE 400 (241.3 MG) MG PO TABS: 400.0000 mg | ORAL_TABLET | Freq: Every day | ORAL | 6 refills | Status: DC

## 2019-11-23 MED ORDER — SOTALOL HCL 80 MG PO TABS: 80.0000 mg | ORAL_TABLET | Freq: Two times a day (BID) | ORAL | 6 refills | Status: DC

## 2019-11-23 MED ORDER — ATORVASTATIN CALCIUM 80 MG PO TABS: ORAL_TABLET | ORAL | 6 refills | Status: DC

## 2019-11-23 MED ORDER — MAGNESIUM SULFATE 2 GM/50ML IV SOLN
2.0000 g | Freq: Once | INTRAVENOUS | Status: AC
  Administered 2019-11-23: 2 g via INTRAVENOUS
  Filled 2019-11-23: qty 50

## 2019-11-23 MED ORDER — RIVAROXABAN 20 MG PO TABS: 20.0000 mg | ORAL_TABLET | Freq: Every day | ORAL | 6 refills | Status: DC

## 2019-11-23 MED ORDER — MAGNESIUM OXIDE 400 (241.3 MG) MG PO TABS
400.0000 mg | ORAL_TABLET | Freq: Every day | ORAL | Status: DC
  Administered 2019-11-23: 400 mg via ORAL
  Filled 2019-11-23: qty 1

## 2019-11-23 MED ORDER — AMIODARONE HCL 200 MG PO TABS: 200.0000 mg | ORAL_TABLET | Freq: Two times a day (BID) | ORAL | 6 refills | Status: DC

## 2019-11-23 NOTE — Discharge Instructions (Signed)
***  PLEASE REMEMBER TO BRING ALL OF YOUR MEDICATIONS TO EACH OF YOUR FOLLOW-UP OFFICE VISITS.  

## 2019-11-23 NOTE — Progress Notes (Addendum)
Explained and discussed discharge instructions, patient aware of follow up appointments, prescriptions sent to [facility]. No complaints voiced at this time. Patient going home with wife and belongings.

## 2019-11-23 NOTE — Plan of Care (Signed)
  Problem: Clinical Measurements: Goal: Ability to maintain clinical measurements within normal limits will improve 11/23/2019 0909 by Don Perking, RN Outcome: Completed/Met 11/23/2019 0734 by Don Perking, RN Outcome: Progressing Goal: Will remain free from infection 11/23/2019 0909 by Don Perking, RN Outcome: Completed/Met 11/23/2019 0734 by Don Perking, RN Outcome: Progressing Goal: Diagnostic test results will improve 11/23/2019 0909 by Don Perking, RN Outcome: Completed/Met 11/23/2019 0734 by Don Perking, RN Outcome: Progressing Goal: Respiratory complications will improve 11/23/2019 0909 by Don Perking, RN Outcome: Completed/Met 11/23/2019 0734 by Don Perking, RN Outcome: Progressing Goal: Cardiovascular complication will be avoided 11/23/2019 0909 by Don Perking, RN Outcome: Completed/Met 11/23/2019 0734 by Don Perking, RN Outcome: Progressing   Problem: Activity: Goal: Risk for activity intolerance will decrease 11/23/2019 0909 by Don Perking, RN Outcome: Completed/Met 11/23/2019 0734 by Don Perking, RN Outcome: Progressing   Problem: Coping: Goal: Level of anxiety will decrease 11/23/2019 0909 by Don Perking, RN Outcome: Completed/Met 11/23/2019 0734 by Don Perking, RN Outcome: Progressing   Problem: Safety: Goal: Ability to remain free from injury will improve 11/23/2019 0909 by Don Perking, RN Outcome: Completed/Met 11/23/2019 0734 by Don Perking, RN Outcome: Progressing   Problem: Skin Integrity: Goal: Risk for impaired skin integrity will decrease 11/23/2019 0909 by Don Perking, RN Outcome: Completed/Met 11/23/2019 0734 by Don Perking, RN Outcome: Progressing   Problem: Education: Goal: Ability to manage disease process will improve 11/23/2019 0909 by Don Perking, RN Outcome: Completed/Met 11/23/2019 0734 by Don Perking, RN Outcome:  Progressing Goal: Individualized Educational Video(s) 11/23/2019 0909 by Don Perking, RN Outcome: Completed/Met 11/23/2019 0734 by Don Perking, RN Outcome: Progressing   Problem: Cardiac: Goal: Ability to achieve and maintain adequate cardiopulmonary perfusion will improve 11/23/2019 0909 by Don Perking, RN Outcome: Completed/Met 11/23/2019 0734 by Don Perking, RN Outcome: Progressing   Problem: Education: Goal: Ability to demonstrate management of disease process will improve 11/23/2019 0909 by Don Perking, RN Outcome: Completed/Met 11/23/2019 0734 by Don Perking, RN Outcome: Progressing Goal: Ability to verbalize understanding of medication therapies will improve 11/23/2019 0909 by Don Perking, RN Outcome: Completed/Met 11/23/2019 0734 by Don Perking, RN Outcome: Progressing Goal: Individualized Educational Video(s) 11/23/2019 0909 by Don Perking, RN Outcome: Completed/Met 11/23/2019 0734 by Don Perking, RN Outcome: Progressing   Problem: Activity: Goal: Capacity to carry out activities will improve 11/23/2019 0909 by Don Perking, RN Outcome: Completed/Met 11/23/2019 0734 by Don Perking, RN Outcome: Progressing   Problem: Cardiac: Goal: Ability to achieve and maintain adequate cardiopulmonary perfusion will improve 11/23/2019 0909 by Don Perking, RN Outcome: Completed/Met 11/23/2019 0734 by Don Perking, RN Outcome: Progressing

## 2019-11-23 NOTE — TOC Transition Note (Signed)
Transition of Care [facility]) - CM/SW Discharge Note   Patient Details  Name: [name] [name] MRN: [id number] Date of Birth: 1960/10/03  [facility] CM/SW Contact:  Lawerance Sabal, RN Phone Number: 11/23/2019, 9:47 AM   Clinical Narrative:    Patient provided with 30 day xaralto and [facility] cards. No other CM needs identified.         Patient Goals and CMS Choice        Discharge Placement                       Discharge Plan and Services                                     Social Determinants of Health (SDOH) Interventions     Readmission Risk Interventions No flowsheet data found.

## 2019-11-23 NOTE — Progress Notes (Signed)
Patient will self-place CPAP when ready. RT added water to chamber. Patient informed to call if he needs any assistance.

## 2019-11-23 NOTE — Plan of Care (Signed)
  Problem: Clinical Measurements: Goal: Ability to maintain clinical measurements within normal limits will improve Outcome: Progressing Goal: Will remain free from infection Outcome: Progressing Goal: Diagnostic test results will improve Outcome: Progressing Goal: Respiratory complications will improve Outcome: Progressing Goal: Cardiovascular complication will be avoided Outcome: Progressing   Problem: Activity: Goal: Risk for activity intolerance will decrease Outcome: Progressing   Problem: Coping: Goal: Level of anxiety will decrease Outcome: Progressing   Problem: Safety: Goal: Ability to remain free from injury will improve Outcome: Progressing   Problem: Skin Integrity: Goal: Risk for impaired skin integrity will decrease Outcome: Progressing   Problem: Education: Goal: Ability to manage disease process will improve Outcome: Progressing Goal: Individualized Educational Video(s) Outcome: Progressing   Problem: Cardiac: Goal: Ability to achieve and maintain adequate cardiopulmonary perfusion will improve Outcome: Progressing   Problem: Education: Goal: Ability to demonstrate management of disease process will improve Outcome: Progressing Goal: Ability to verbalize understanding of medication therapies will improve Outcome: Progressing Goal: Individualized Educational Video(s) Outcome: Progressing   Problem: Activity: Goal: Capacity to carry out activities will improve Outcome: Progressing   Problem: Cardiac: Goal: Ability to achieve and maintain adequate cardiopulmonary perfusion will improve Outcome: Progressing   

## 2019-11-23 NOTE — Progress Notes (Signed)
Patient ID: [name] [name], male   DOB: December 22, 1960, 59 y.o.   MRN: [id number]     Advanced Heart Failure Rounding Note  PCP-Cardiologist: No primary care provider on file.   Subjective:    On po torsemide. Feels good. Denies SOB, orthopnea or PND. Remains in NSR.  Has 4 cups of water on his tray.    Objective:   Weight Range: (!) 138.9 kg Body mass index is 37.27 kg/m.   Vital Signs:   Temp:  [97.4 F (36.3 C)-98.6 F (37 C)] 98 F (36.7 C) (03/07 0713) Pulse Rate:  [57-73] 63 (03/07 0713) Resp:  [12-26] 12 (03/07 0713) BP: (81-121)/(46-72) 104/64 (03/07 0713) SpO2:  [93 %-99 %] 95 % (03/07 0713) Weight:  [138.9 kg] 138.9 kg (03/07 0300) Last BM Date: 11/22/19  Weight change: Filed Weights   11/21/19 0604 11/22/19 0234 11/23/19 0300  Weight: (!) 139.9 kg (!) 139.7 kg (!) 138.9 kg    Intake/Output:   Intake/Output Summary (Last 24 hours) at 11/23/2019 0815 Last data filed at 11/23/2019 9833 Gross per 24 hour  Intake 720 ml  Output 2150 ml  Net -1430 ml      Physical Exam    General:  Obese male sitting up in bed HEENT: normal Neck: supple. no JVD. Carotids 2+ bilat; no bruits. No lymphadenopathy or thryomegaly appreciated. Cor: PMI nondisplaced. Regular rate & rhythm. No rubs, gallops or murmurs. Lungs: clear Abdomen: soft, nontender, nondistended. No hepatosplenomegaly. No bruits or masses. Good bowel sounds. Extremities: no cyanosis, clubbing, rash, edema Neuro: alert & orientedx3, cranial nerves grossly intact. moves all 4 extremities w/o difficulty. Affect pleasant   Telemetry   NSR with v-pacing 60s occasional PVCs. Personally reviewed  Labs    CBC No results for input(s): WBC, NEUTROABS, HGB, HCT, MCV, PLT in the last 72 hours. Basic Metabolic Panel Recent Labs    11/21/19 0159 11/21/19 0159 11/22/19 0246 11/23/19 0207  NA 138  --  138  --   K 3.4*  --  4.3  --   CL 101  --  101  --   CO2 28  --  29  --   GLUCOSE 144*  --  149*  --   BUN 9   --  13  --   CREATININE 1.00  --  1.02  --   CALCIUM 8.8*  --  9.3  --   MG 2.1   < > 2.2 1.9   < > = values in this interval not displayed.   Liver Function Tests No results for input(s): AST, ALT, ALKPHOS, BILITOT, PROT, ALBUMIN in the last 72 hours. No results for input(s): LIPASE, AMYLASE in the last 72 hours. Cardiac Enzymes No results for input(s): CKTOTAL, CKMB, CKMBINDEX, TROPONINI in the last 72 hours.  BNP: BNP (last 3 results) Recent Labs    11/19/19 1806  BNP 964.1*    ProBNP (last 3 results) No results for input(s): PROBNP in the last 8760 hours.   D-Dimer No results for input(s): DDIMER in the last 72 hours. Hemoglobin A1C No results for input(s): HGBA1C in the last 72 hours. Fasting Lipid Panel No results for input(s): CHOL, HDL, LDLCALC, TRIG, CHOLHDL, LDLDIRECT in the last 72 hours. Thyroid Function Tests No results for input(s): TSH, T4TOTAL, T3FREE, THYROIDAB in the last 72 hours.  Invalid input(s): FREET3  Other results:   Imaging    No results found.   Medications:     Scheduled Medications: . amiodarone  400 mg Oral  BID  . atorvastatin  80 mg Oral q1800  . FLUoxetine  20 mg Oral Daily  . folic acid  1 mg Oral Daily  . multivitamin with minerals  1 tablet Oral Daily  . rivaroxaban  20 mg Oral Q supper  . sacubitril-valsartan  1 tablet Oral BID  . sotalol  80 mg Oral Q12H  . spironolactone  25 mg Oral Daily  . thiamine  100 mg Oral Daily  . torsemide  60 mg Oral BID    Infusions: . magnesium sulfate bolus IVPB 2 g (11/23/19 0807)    PRN Medications: acetaminophen, albuterol, tiZANidine   Assessment/Plan   1. VT: Admitted with recurrent VT in setting of CHF exacerbation.  Long history of VT.  He has not been taking K and was hypokalemic.  - Remains in sinus rhythm on p.o. amnio and sotalol. Discussed with EP - K ok. Mag 1.9. Will give mag oxide 400 x1 - ?VT ablation in future.  Discussed with Dr. Elberta Fortis. 2. Acute on  chronic systolic CHF: NICM, Echo 12/2016 EF 15%. Normalcoronaries on cath inJuly 2016. [employer] CRT-Ddevice.ETOH vs HTN vs myocarditis as cause of cardiomyopathy.Echo in 12/20 showed that EF remains 20-25%. He has been out of Entresto and spironolactone.  He did not tolerate [place].  He has been taking torsemide.  Weight was up about 20 lbs. has diuresed well.  Creatinine stable. -Back on torsemide 60 mg twice daily which is his home dose -He is back on Entresto 49-51 mg twice a day and spironolactone 25 mg daily.  Will need assistance getting Sherryll Burger (working on this).  - Intolerant to Coreg in the past with hypotension, he is on sotalol and amiodarone. - He needs to cut back on ETOH as he is still drinking fairly heavily. Also need to watch fluid intake. Reinforced need for daily weights and reviewed use of sliding scale diuretics. - QRS is wide despite BiV pacing, EP has turned off LV lead.  Still with wide QRS (174 msec native).  Dr. Graciela Husbands to try different LV pacing configurations today, if not helpful I wonder if there would be utility to left bundle pacing.  3.Paroxysmal atrial fibrillation: He is in NSR today. - Continue amiodarone and sotalol.  Discussed with EP. - Continue Xarelto 20 mg daily. No bleeding 4. ETOH abuse: Suspect this contributes to cardiomyopathy.Still drinking heavily, I strongly encouraged him to quit. 5. Tobacco abuse:No longer smoking. 6. OSA: Continue nightly CPAP.  Ok for home today. F/u HF Clinic next week.   Length of Stay: 4  Arvilla Meres, MD  11/23/2019, 8:15 AM  Advanced Heart Failure Team Pager [phone] (M-F; 7a - 4p)  Please contact [facility] for night-coverage after hours (4p -7a ) and weekends on [email or web address]

## 2019-11-23 NOTE — Discharge Summary (Addendum)
Discharge Summary    Patient ID: [name] [name] MRN: [id number]; DOB: Nov 06, 1960  Admit date: 11/19/2019 Discharge date: 11/23/2019  Primary Care Provider: [name], MD  Primary Cardiologist: Loralie Champagne, MD  Primary Electrophysiologist:  Virl Axe, MD   Discharge Diagnoses    Principal Problem:   Ventricular tachycardia [facility]) Active Problems:   NICM   Acute on chronic systolic CHF (congestive heart failure) ([place])   Hypokalemia   Essential hypertension   Alcohol abuse   Atrial fibrillation with RVR - Paroxysmal   S/P ICD (internal cardiac defibrillator) procedure Medtronic CRT ICD   Obesity   OSA (obstructive sleep apnea)  Diagnostic Studies/Procedures    None _____________   History of Present Illness     [name] [name] is a 59 y.o. male with a history of chronic combined systolic and diastolic congestive heart failure with an EF of 20-25%, dilated nonischemic cardiomyopathy status post biventricular ICD, paroxysmal atrial fibrillation on Xarelto, ongoing alcohol and tobacco abuse, sleep apnea on CPAP, hypertension, and ventricular tachycardia on sotalol and amiodarone therapy.  Patient has had multiple ICD shocks and antitachycardia pacing episodes since December 2020.  He has been followed closely by electrophysiology as an outpatient.  He has been noncompliant with several of his medications due to cost.  In that setting, he has been feeling fatigued with some worsening of dyspnea on exertion.  On March 3, after being seen by his primary care provider, he took a nap and later awoke feeling lightheaded and nauseated.  He then received an ICD shock.  He contacted our clinic and while he was on the phone, he had recurrent lightheadedness and nausea and was shocked a second time.  He was advised to present to the emergency department for evaluation.  Device interrogation showed 4 shocks over 3 episodes and one ATP terminated episode.  He was noted to have fast ventricular  tachycardia with rates into the 270s.  He was placed on IV amiodarone and in the setting of hypokalemia (K+ = 3.0), potassium was supplemented.  He was admitted to stepdown for further evaluation.  Hospital Course     Consultants: Advanced Heart Failure Team  Following admission, patient's previous home medications including Entresto and spironolactone were resumed.  He was seen by the heart failure team and noted to be up 20 pounds since December 2020 with significant volume overload on exam.  He was placed on Lasix 80 mg IV twice daily and continued to require potassium along with magnesium supplementation.  He was maintained on oral sotalol and intravenous amiodarone without recurrent ventricular tachycardia.  Electrophysiology was involved in his care throughout admission and amiodarone was subsequently changed to 200 mg twice daily.  It was felt that hypokalemia likely played a large role in his recurrent ventricular tachycardia prior to admission.  With diuresis, he was -2.5 L for admission.  His weight dropped from 142.4 kg on admission to 138.9 kg this morning.  Diuretic has been transitioned back to his previous home dose of torsemide 60 mg twice daily and the importance of compliance was stressed.  He remains off of carvedilol therapy in the setting of prior hypotension.  He was also previously intolerant to [place] therapy, therefore he is not on an SGLT2 inhibitor at this time.  He has maintained sinus rhythm throughout this admission and Xarelto therapy has been continued.  He has been counseled on importance of complete tobacco and alcohol cessation.  He will be discharged home today in good condition  Did the patient have an acute coronary syndrome (MI, NSTEMI, STEMI, etc) this admission?:  No                               Did the patient have a percutaneous coronary intervention (stent / angioplasty)?:  No.   _____________  Discharge Vitals Blood pressure 104/64, pulse 63, temperature  98 F (36.7 C), temperature source Oral, resp. rate 12, height 6\' 4"  (1.93 m), weight (!) 138.9 kg, SpO2 95 %.  Filed Weights   11/21/19 0604 11/22/19 0234 11/23/19 0300  Weight: (!) 139.9 kg (!) 139.7 kg (!) 138.9 kg    Labs & Radiologic Studies    CBC Lab Results  Component Value Date   WBC 7.0 11/19/2019   HGB 10.8 (L) 11/19/2019   HCT 32.7 (L) 11/19/2019   MCV 98.2 11/19/2019   PLT 176 11/19/2019    Basic Metabolic Panel Recent Labs    01/19/2020 0159 11/21/19 0159 11/22/19 0246 11/23/19 0207  NA 138  --  138  --   K 3.4*  --  4.3  --   CL 101  --  101  --   CO2 28  --  29  --   GLUCOSE 144*  --  149*  --   BUN 9  --  13  --   CREATININE 1.00  --  1.02  --   CALCIUM 8.8*  --  9.3  --   MG 2.1   < > 2.2 1.9   < > = values in this interval not displayed.   High Sensitivity Troponin:   Recent Labs  Lab 11/19/19 1620 11/19/19 1806  TROPONINIHS 18* 20*    _____________  DG Chest Portable 1 View  Result Date: 11/19/2019 CLINICAL DATA:  Defibrillator activation EXAM: PORTABLE CHEST 1 VIEW COMPARISON:  10/29/2018 FINDINGS: Stable positioning of a left-sided implanted cardiac device. Low lung volumes. Grossly stable heart size. There is pulmonary vascular congestion and crowding of the central bronchovascular markings, likely accentuated by low lung volumes. No focal airspace consolidation. No large pleural fluid collection. No pneumothorax. IMPRESSION: Low lung volumes with pulmonary vascular congestion. Electronically Signed   By: 12/28/2018 D.O.   On: 11/19/2019 16:37   Disposition   Pt is being discharged home today in good condition.  Follow-up Plans & Appointments    Follow-up Information     [facility] Follow up on 12/04/2019.   Specialty: Cardiology Why: 3:00 PM. Advanced Heart Failure Clinic Parking Garage Code [phone]  Contact information: [address] [place] [address] [place]  [place] [phone]          Discharge Instructions     (HEART FAILURE PATIENTS) Call MD:  Anytime you have any of the following symptoms: 1) 3 pound weight gain in 24 hours or 5 pounds in 1 week 2) shortness of breath, with or without a dry hacking cough 3) swelling in the hands, feet or stomach 4) if you have to sleep on extra pillows at night in order to breathe.   Complete by: As directed    Call MD for:  difficulty breathing, headache or visual disturbances   Complete by: As directed    Call MD for:  extreme fatigue   Complete by: As directed    Call MD for:  persistant dizziness or light-headedness   Complete by: As directed    Diet -  low sodium heart healthy   Complete by: As directed    Heart Failure patients record your daily weight using the same scale at the same time of day   Complete by: As directed    Increase activity slowly   Complete by: As directed        Discharge Medications   Allergies as of 11/23/2019   No Known Allergies      Medication List     STOP taking these medications    aspirin EC 325 MG tablet   dapagliflozin propanediol 10 MG Tabs tablet Commonly known as: FARXIGA       TAKE these medications    acetaminophen 325 MG tablet Commonly known as: TYLENOL Take 2 tablets (650 mg total) by mouth every 4 (four) hours as needed for headache or mild pain.   albuterol 108 (90 Base) MCG/ACT inhaler Commonly known as: VENTOLIN HFA Inhale 1 puff into the lungs every 6 (six) hours as needed for wheezing or shortness of breath.   amiodarone 200 MG tablet Commonly known as: Pacerone Take 1 tablet (200 mg total) by mouth 2 (two) times daily. Decrease to 1 tablet (200 mg total) by mouth once daily beginning 10/20/19.   atorvastatin 80 MG tablet Commonly known as: LIPITOR TAKE 1 TABLET BY MOUTH ONCE DAILY AT  6  PM   Entresto 49-51 MG Generic drug: sacubitril-valsartan Take 1 tablet by mouth 2 (two) times daily.   FLUoxetine 20 MG  tablet Commonly known as: PROZAC Take 20 mg by mouth daily.   magnesium oxide 400 (241.3 Mg) MG tablet Commonly known as: MAG-OX Take 1 tablet (400 mg total) by mouth daily. Start taking on: November 24, 2019   potassium chloride SA 20 MEQ tablet Commonly known as: KLOR-CON Take 2 tablets (40 mEq total) by mouth 2 (two) times daily.   rivaroxaban 20 MG Tabs tablet Commonly known as: Xarelto Take 1 tablet (20 mg total) by mouth daily with supper.   sotalol 80 MG tablet Commonly known as: BETAPACE Take 1 tablet (80 mg total) by mouth every 12 (twelve) hours. What changed: when to take this   spironolactone 25 MG tablet Commonly known as: ALDACTONE TAKE 1 TABLET BY MOUTH ONCE DAILY (NEED  OFFICE  VISIT  FOR  FUTURE  REFILLS)   tiZANidine 2 MG tablet Commonly known as: ZANAFLEX Take 1 tablet (2 mg total) by mouth every 8 (eight) hours as needed for muscle spasms.   torsemide 20 MG tablet Commonly known as: DEMADEX Take 3 tablets (60 mg total) by mouth 2 (two) times daily. What changed: when to take this         Outstanding Labs/Studies   F/u bmet & Mg @ office follow-up.  Duration of Discharge Encounter   Greater than 30 minutes including physician time.  Signed, Nicolasa Ducking, NP 11/23/2019, 9:28 AM  Patient seen and examined with the above-signed Advanced Practice Provider and/or Housestaff. I personally reviewed laboratory data, imaging studies and relevant notes. I independently examined the patient and formulated the important aspects of the plan. I have edited the note to reflect any of my changes or salient points. I have personally discussed the plan with the patient and/or family.  Volume status much improved. VT quiescent. Ok for d/c.   Arvilla Meres, MD  8:37 PM

## 2019-11-24 ENCOUNTER — Encounter (HOSPITAL_COMMUNITY): Payer: Self-pay

## 2019-11-24 ENCOUNTER — Telehealth (HOSPITAL_COMMUNITY): Payer: Self-pay | Admitting: Licensed Clinical Social Worker

## 2019-11-24 NOTE — Telephone Encounter (Addendum)
CSW called [employer] to check on status of patient application for Xarelto assistance- they confirm they have everything they need now but they still have not processed the application  CSW called pt to check in after hospital discharge over the weekend.  Pt reports he is doing well and glad to be home so he can get better rest.  Pt reports he was not given his depression medication through [facility] pharmacy but states he has some at home- pt provides name of a medication other that prozac that he is on though CSW had confirmed with his PCP that they had last prescribed him prozac- he states he has some of this other medication at home and started taking it.  CSW encouraged pt to call PCP office after we get off the phone to discuss- pt reports he will do so.  CSW will continue to follow and assist as needed   [name], LCSW Clinical Social Worker [facility] Desk#: [phone] Cell#: [phone]

## 2019-11-25 ENCOUNTER — Telehealth (HOSPITAL_COMMUNITY): Payer: Self-pay | Admitting: Licensed Clinical Social Worker

## 2019-11-25 LAB — CULTURE, BLOOD (ROUTINE X 2)
Culture: NO GROWTH
Culture: NO GROWTH
Special Requests: ADEQUATE
Special Requests: ADEQUATE

## 2019-11-25 NOTE — Telephone Encounter (Signed)
CSW received call from pt sister requesting to touch base about several things that CSW has talked with the patient about.  CSW confirmed with patient that he was agreeable to me speaking with his sister about anything to do with his care.  Pt sister wanted to discuss Extra Help program which CSW helped pt apply for and how to apply for food stamps.  CSW provided requested information and emailed her a link to [employer] website where she can assist him in applying for foodstamps.  CSW will continue to follow and assist as needed  [name], LCSW Clinical Social Worker [facility] Desk#: [phone] Cell#: [phone]

## 2019-11-26 ENCOUNTER — Telehealth (HOSPITAL_COMMUNITY): Payer: Self-pay | Admitting: Pharmacist

## 2019-11-26 ENCOUNTER — Other Ambulatory Visit (HOSPITAL_COMMUNITY): Payer: Self-pay

## 2019-11-26 ENCOUNTER — Telehealth (HOSPITAL_COMMUNITY): Payer: Self-pay | Admitting: Licensed Clinical Social Worker

## 2019-11-26 NOTE — Telephone Encounter (Signed)
CSW informed by pharmacist that pt has been denied by [employer] and [employer] patient assistance until he reaches $744 out of pocket for the year.  CSW called his pharmacy and he has only spent $152 out of pocket at this time- does not look like he would meet out of pocket requirement by the end of the year so will not be able to get patient assistance with Xarelto.    Pt also received letter from [facility] stating that he is not eligible for Extra Help program which we had helped would reduce his medication copays.   Community Paramedic informed of these barriers and they helped pt call his insurance provider and find preferred pharmacy where pt can get Xarelto fro $40/month or $100/90day period- pt reports this is affordable  There had also been some confusion on what antidepressant pt was supposed to be on.  When CSW called PCP last week was informed last prescribed medication was prozac from Oct 2020 but pt just filled bottle of Remeron on 11/19/19 from his PCP.  CSW called back PCP office to clarify.  They now report pt was prescribed Remeron 11/19/19 after calling their office complaining of depression and inability to sleep- he is supposed to be trying out this medication and calling them to check in sometime this week.  CSW updated Community paramedic.  CSW will continue to follow and assist as needed  [name], LCSW Clinical Social Worker [facility] Desk#: [phone] Cell#: [phone]

## 2019-11-26 NOTE — Progress Notes (Signed)
Paramedicine Encounter    Patient ID: [name] [name], male    DOB: Aug 26, 1961, 59 y.o.   MRN: [id number]   Patient Care Team: [name], MD as PCP - General (Family Medicine) [name], [name], MD as PCP - Advanced Heart Failure (Cardiology) [name], MD as PCP - Electrophysiology (Cardiology) [name], MD as PCP - Cardiology (Cardiology) [name], MD (Family Medicine) [name], LCSW as Social Worker (Licensed Clinical Social Worker)  Patient Active Problem List   Diagnosis Date Noted  . Hypokalemia 11/23/2019  . Ventricular tachycardia (HCC) 11/19/2019  . Primary osteoarthritis of right hip 11/03/2018  . VT (ventricular tachycardia) (HCC) 05/30/2017  . AKI (acute kidney injury) (HCC)   . Acute on chronic systolic CHF (congestive heart failure) (HCC) 01/29/2017  . HTN (hypertension) 12/25/2016  . Obesity 12/25/2016  . OSA (obstructive sleep apnea) 12/25/2016  . Hyperglycemia 12/25/2016  . Acute respiratory failure with hypoxia (HCC) 12/25/2016  . Chronic anticoagulation 06/27/2016  . Encounter for therapeutic drug monitoring 04/19/2015  . S/P ICD (internal cardiac defibrillator) procedure Medtronic CRT ICD 04/15/2015  . Atrial fibrillation with RVR - Paroxysmal 04/08/2015  . NICM 04/06/2015  . Troponin level elevated 04/06/2015  . Rectal bleeding 10/26/2011  . Hydrocele, left 05/24/2011  . Tobacco use disorder- 1/2 pk a day   . Alcohol abuse   . Obesity 09/27/2008  . CAD- minor CAD 2009 and 2016 09/27/2008  . VENTRICULAR TACHYCARDIA 09/27/2008  . Essential hypertension 06/19/2008  . Chronic systolic heart failure (HCC) 06/19/2008    Current Outpatient Medications:  .  acetaminophen (TYLENOL) 325 MG tablet, Take 2 tablets (650 mg total) by mouth every 4 (four) hours as needed for headache or mild pain., Disp: , Rfl:  .  albuterol (PROVENTIL HFA;VENTOLIN HFA) 108 (90 Base) MCG/ACT inhaler, Inhale 1 puff into the lungs every 6 (six)  hours as needed for wheezing or shortness of breath., Disp: 1 Inhaler, Rfl: 1 .  amiodarone (PACERONE) 200 MG tablet, Take 1 tablet (200 mg total) by mouth 2 (two) times daily. Decrease to 1 tablet (200 mg total) by mouth once daily beginning 10/20/19., Disp: 60 tablet, Rfl: 6 .  atorvastatin (LIPITOR) 80 MG tablet, TAKE 1 TABLET BY MOUTH ONCE DAILY AT  6  PM, Disp: 30 tablet, Rfl: 6 .  magnesium oxide (MAG-OX) 400 (241.3 Mg) MG tablet, Take 1 tablet (400 mg total) by mouth daily., Disp: 30 tablet, Rfl: 6 .  potassium chloride SA (KLOR-CON) 20 MEQ tablet, Take 2 tablets (40 mEq total) by mouth 2 (two) times daily., Disp: 120 tablet, Rfl: 6 .  rivaroxaban (XARELTO) 20 MG TABS tablet, Take 1 tablet (20 mg total) by mouth daily with supper., Disp: 30 tablet, Rfl: 6 .  sacubitril-valsartan (ENTRESTO) 49-51 MG, Take 1 tablet by mouth 2 (two) times daily., Disp: 60 tablet, Rfl: 6 .  sotalol (BETAPACE) 80 MG tablet, Take 1 tablet (80 mg total) by mouth every 12 (twelve) hours., Disp: 60 tablet, Rfl: 6 .  spironolactone (ALDACTONE) 25 MG tablet, TAKE 1 TABLET BY MOUTH ONCE DAILY (NEED  OFFICE  VISIT  FOR  FUTURE  REFILLS), Disp: 30 tablet, Rfl: 6 .  torsemide (DEMADEX) 20 MG tablet, Take 3 tablets (60 mg total) by mouth 2 (two) times daily., Disp: 180 tablet, Rfl: 6 .  FLUoxetine (PROZAC) 20 MG tablet, Take 20 mg by mouth daily., Disp: , Rfl:  .  tiZANidine (ZANAFLEX) 2 MG tablet, Take 1 tablet (2 mg  total) by mouth every 8 (eight) hours as needed for muscle spasms. (Patient not taking: Reported on 11/19/2019), Disp: 40 tablet, Rfl: 0 No Known Allergies    Social History   Socioeconomic History  . Marital status: Divorced    Spouse name: Not on file  . Number of children: 2  . Years of education: Not on file  . Highest education level: Not on file  Occupational History  . Occupation: Copy  . Occupation: disabled, odd jobs  Tobacco Use  . Smoking status: Former Smoker    Packs/day: 0.50     Years: 4.00    Pack years: 2.00    Types: Cigarettes    Quit date: 10/29/2005    Years since quitting: 14.0  . Smokeless tobacco: Never Used  . Tobacco comment: about a pack a week. social  Substance and Sexual Activity  . Alcohol use: Yes    Alcohol/week: 42.0 standard drinks    Types: 42 Cans of beer per week    Comment: 24 a week  . Drug use: Yes    Frequency: 7.0 times per week    Types: Marijuana    Comment: has discontinued  . Sexual activity: Yes    Partners: Female  Other Topics Concern  . Not on file  Social History Narrative   ** Merged History Encounter **       Social Determinants of Health   Financial Resource Strain:   . Difficulty of Paying Living Expenses: Not on file  Food Insecurity:   . Worried About Programme researcher, broadcasting/film/video in the Last Year: Not on file  . Ran Out of Food in the Last Year: Not on file  Transportation Needs:   . Lack of Transportation (Medical): Not on file  . Lack of Transportation (Non-Medical): Not on file  Physical Activity:   . Days of Exercise per Week: Not on file  . Minutes of Exercise per Session: Not on file  Stress:   . Feeling of Stress : Not on file  Social Connections:   . Frequency of Communication with Friends and Family: Not on file  . Frequency of Social Gatherings with Friends and Family: Not on file  . Attends Religious Services: Not on file  . Active Member of Clubs or Organizations: Not on file  . Attends Banker Meetings: Not on file  . Marital Status: Not on file  Intimate Partner Violence:   . Fear of Current or Ex-Partner: Not on file  . Emotionally Abused: Not on file  . Physically Abused: Not on file  . Sexually Abused: Not on file    Physical Exam Cardiovascular:     Rate and Rhythm: Normal rate and regular rhythm.     Pulses: Normal pulses.  Pulmonary:     Effort: Pulmonary effort is normal.     Breath sounds: Normal breath sounds.  Musculoskeletal:        General: Normal range of  motion.     Right lower leg: No edema.     Left lower leg: No edema.  Skin:    General: Skin is warm and dry.     Capillary Refill: Capillary refill takes less than 2 seconds.  Neurological:     Mental Status: He is alert and oriented to person, place, and time.  Psychiatric:        Mood and Affect: Mood normal.         Future Appointments  Date Time Provider Department Center  12/01/2019  9:25 AM [facility] DEVICE REMOTES [facility]  12/04/2019  3:00 PM [facility] PA/NP [facility] None  12/30/2019  7:20 AM [facility] DEVICE REMOTES [facility]  01/16/2020 11:00 AM Larey Dresser, MD [facility] None  03/30/2020  7:20 AM [facility] DEVICE REMOTES [facility]  06/29/2020  7:20 AM [facility] DEVICE REMOTES [facility]    BP 94/60 (BP Location: Left Arm, Patient Position: Sitting, Cuff Size: Normal)   Pulse 66   Resp 16   Wt (!) 313 lb (142 kg)   SpO2 97%   BMI 38.10 kg/m   Weight yesterday- 312 lb Last visit weight- n/a  [name] [name] was sen at home today for our initial visit since he was re-referred to [employer]. He reported feeling generally well, denied chest pain, SOB, headache, orthopnea, fever or cough since being discharged from the hospital. He stated he has compliant with his medications and his weight is sitting at 313 lb. He stated he is currently unable to afford Xarelto. I contacted his insurance and was able to get his medications transferred to [facility] mail order pharmacy which is a preferred pharmacy with his insurance. This will bring his cost on Xarelto down from $187/month to $100/3 months. Other medications will also be cheaper via this option and [name] Crady said he would be able to afford this. His medications were verified and his pillbox was refilled. I will follow up next week and work on finding him a new PCP, as he has expressed displeasure with his current provider.   Need new Rx of torsemide,  potassium, mirtazapine, albuterol sent to [facility].   Jacquiline Doe, EMT 11/26/19  ACTION: Home visit completed Next visit planned for 1 week

## 2019-11-26 NOTE — Telephone Encounter (Signed)
Received message from [employer] that patient was denied for Xarelto Patient Assistance as he has not met the out of pocket expense requirement for 2021 yet. Representative has informed me that he will need to spend $744.00 on prescription drugs before approved. Social Work is informing patient of decision. Once he has spent this amount on prescription drugs, will send the pharmacy out of pocket expense report to [employer] and the request will be reprocessed.   Lauren Kemp, PharmD, BCPS, BCCP, CPP Heart Failure Clinic Pharmacist [phone]  

## 2019-12-01 ENCOUNTER — Ambulatory Visit (INDEPENDENT_AMBULATORY_CARE_PROVIDER_SITE_OTHER): Payer: Medicare Other

## 2019-12-01 ENCOUNTER — Telehealth (HOSPITAL_COMMUNITY): Payer: Self-pay | Admitting: Licensed Clinical Social Worker

## 2019-12-01 DIAGNOSIS — Z95810 Presence of automatic (implantable) cardiac defibrillator: Secondary | ICD-10-CM

## 2019-12-01 DIAGNOSIS — I5022 Chronic systolic (congestive) heart failure: Secondary | ICD-10-CM | POA: Diagnosis not present

## 2019-12-01 NOTE — Progress Notes (Signed)
EPIC Encounter for ICM Monitoring  Patient Name: [name] [name] is a 59 y.o. male Date: 12/01/2019 Primary Care Physican: [name], MD Primary Cardiologist:[name] Electrophysiologist:[name] Bi-V Pacing:96.8% 12/01/2019 Weight: 311lbs   Spoke with patient and he is doing well since hospital discharge on 3/7 due to ICD shocks.   He was diuresed during hospitalization.  OptivolThoracic impedancenormal.  Prescribed:Torsemide20 mg 3 tablets (60 mg total) twice a day. Potassium 2 tablets (40 mEq total) twice a day.  Labs: 09/29/2019 Creatinine 1.10, BUN 13, Potassium 4.0, Sodium 140, GFR 75-86 09/23/2019 Creatinine1.02, BUN11, Potassium4.0, Sodium140, GFR>60 09/16/2019 Creatinine1.01, BUN11, Potassium4.1, Sodium140, GFR>60  08/22/2019 Creatinine1.19, BUN14, Potassium4.0, Sodium141, GFR>60  A complete set of results can be found in Results Review.  Recommendations:No changes and encouraged to call if experiencing any fluid symptoms.  Follow-up plan: ICM clinic phone appointment on4/15/2021. 91 day device clinic remote transmission 12/30/2019.  Office visit with HF clinic NP/PA on3/18/2021 and with Dr Shirlee Latch on 01/16/2020.  Copy of ICM check sent to Dr.[name]  3 month ICM trend: 12/01/2019    1 Year ICM trend:       [name], RN 12/01/2019 4:54 PM

## 2019-12-01 NOTE — Telephone Encounter (Signed)
CSW called pt to see if they received or been scheduled to receive the COVID-19 vaccine at this time.  Unable to reach- left message requesting return call to discuss and provide resources to sign up.  Shontay Wallner H. Jammy Plotkin, LCSW Clinical Social Worker [facility] Desk#: [phone] Cell#: [phone]  

## 2019-12-02 ENCOUNTER — Telehealth (HOSPITAL_COMMUNITY): Payer: Self-pay | Admitting: Licensed Clinical Social Worker

## 2019-12-02 NOTE — Telephone Encounter (Signed)
CSW called pt to see if they have received or been scheduled to receive the COVID-19 vaccine at this time.  Pt is interested in getting the shot but is only interested in the [place] shot so he has to go once.  CSW explained that to my knowledge there are no local sites currently administering that shot.  Pt not interested in Emergency planning/management officer or Moderna shot right now.  [name], LCSW Clinical Social Worker [facility] Desk#: [phone] Cell#: [phone]

## 2019-12-03 ENCOUNTER — Other Ambulatory Visit (HOSPITAL_COMMUNITY): Payer: Self-pay

## 2019-12-03 ENCOUNTER — Other Ambulatory Visit (HOSPITAL_COMMUNITY): Payer: Self-pay | Admitting: *Deleted

## 2019-12-03 MED ORDER — POTASSIUM CHLORIDE CRYS ER 20 MEQ PO TBCR: 40.0000 meq | EXTENDED_RELEASE_TABLET | Freq: Two times a day (BID) | ORAL | 3 refills | Status: DC

## 2019-12-03 MED ORDER — TORSEMIDE 20 MG PO TABS: 60.0000 mg | ORAL_TABLET | Freq: Two times a day (BID) | ORAL | 3 refills | Status: DC

## 2019-12-03 MED ORDER — ALBUTEROL SULFATE HFA 108 (90 BASE) MCG/ACT IN AERS: 1.0000 | INHALATION_SPRAY | Freq: Four times a day (QID) | RESPIRATORY_TRACT | 3 refills | Status: DC | PRN

## 2019-12-03 NOTE — Progress Notes (Signed)
Paramedicine Encounter    Patient ID: [name], male    DOB: 1961-09-16, 59 y.o.   MRN: [id number]   Patient Care Team: [name], MD as PCP - General (Family Medicine) [name], [name], MD as PCP - Advanced Heart Failure (Cardiology) [name], MD as PCP - Electrophysiology (Cardiology) [name], MD as PCP - Cardiology (Cardiology) [name], MD (Family Medicine) [name], LCSW as Social Worker (Licensed Clinical Social Worker)  Patient Active Problem List   Diagnosis Date Noted  . Hypokalemia 11/23/2019  . Ventricular tachycardia (HCC) 11/19/2019  . Primary osteoarthritis of right hip 11/03/2018  . VT (ventricular tachycardia) (HCC) 05/30/2017  . AKI (acute kidney injury) (HCC)   . Acute on chronic systolic CHF (congestive heart failure) (HCC) 01/29/2017  . HTN (hypertension) 12/25/2016  . Obesity 12/25/2016  . OSA (obstructive sleep apnea) 12/25/2016  . Hyperglycemia 12/25/2016  . Acute respiratory failure with hypoxia (HCC) 12/25/2016  . Chronic anticoagulation 06/27/2016  . Encounter for therapeutic drug monitoring 04/19/2015  . S/P ICD (internal cardiac defibrillator) procedure Medtronic CRT ICD 04/15/2015  . Atrial fibrillation with RVR - Paroxysmal 04/08/2015  . NICM 04/06/2015  . Troponin level elevated 04/06/2015  . Rectal bleeding 10/26/2011  . Hydrocele, left 05/24/2011  . Tobacco use disorder- 1/2 pk a day   . Alcohol abuse   . Obesity 09/27/2008  . CAD- minor CAD 2009 and 2016 09/27/2008  . VENTRICULAR TACHYCARDIA 09/27/2008  . Essential hypertension 06/19/2008  . Chronic systolic heart failure (HCC) 06/19/2008    Current Outpatient Medications:  .  acetaminophen (TYLENOL) 325 MG tablet, Take 2 tablets (650 mg total) by mouth every 4 (four) hours as needed for headache or mild pain., Disp: , Rfl:  .  amiodarone (PACERONE) 200 MG tablet, Take 1 tablet (200 mg total) by mouth 2 (two) times daily. Decrease to 1  tablet (200 mg total) by mouth once daily beginning 10/20/19., Disp: 60 tablet, Rfl: 6 .  atorvastatin (LIPITOR) 80 MG tablet, TAKE 1 TABLET BY MOUTH ONCE DAILY AT  6  PM, Disp: 30 tablet, Rfl: 6 .  magnesium oxide (MAG-OX) 400 (241.3 Mg) MG tablet, Take 1 tablet (400 mg total) by mouth daily., Disp: 30 tablet, Rfl: 6 .  rivaroxaban (XARELTO) 20 MG TABS tablet, Take 1 tablet (20 mg total) by mouth daily with supper., Disp: 30 tablet, Rfl: 6 .  sacubitril-valsartan (ENTRESTO) 49-51 MG, Take 1 tablet by mouth 2 (two) times daily., Disp: 60 tablet, Rfl: 6 .  sotalol (BETAPACE) 80 MG tablet, Take 1 tablet (80 mg total) by mouth every 12 (twelve) hours., Disp: 60 tablet, Rfl: 6 .  spironolactone (ALDACTONE) 25 MG tablet, TAKE 1 TABLET BY MOUTH ONCE DAILY (NEED  OFFICE  VISIT  FOR  FUTURE  REFILLS), Disp: 30 tablet, Rfl: 6 .  albuterol (VENTOLIN HFA) 108 (90 Base) MCG/ACT inhaler, Inhale 1 puff into the lungs every 6 (six) hours as needed for wheezing or shortness of breath., Disp: 8 g, Rfl: 3 .  mirtazapine (REMERON) 30 MG tablet, 15 mg at bedtime., Disp: , Rfl:  .  potassium chloride SA (KLOR-CON) 20 MEQ tablet, Take 2 tablets (40 mEq total) by mouth 2 (two) times daily., Disp: 360 tablet, Rfl: 3 .  tiZANidine (ZANAFLEX) 2 MG tablet, Take 1 tablet (2 mg total) by mouth every 8 (eight) hours as needed for muscle spasms. (Patient not taking: Reported on 11/19/2019), Disp: 40 tablet, Rfl: 0 .  torsemide (DEMADEX)  20 MG tablet, Take 3 tablets (60 mg total) by mouth 2 (two) times daily., Disp: 540 tablet, Rfl: 3 No Known Allergies    Social History   Socioeconomic History  . Marital status: Divorced    Spouse name: Not on file  . Number of children: 2  . Years of education: Not on file  . Highest education level: Not on file  Occupational History  . Occupation: Air traffic controller  . Occupation: disabled, odd jobs  Tobacco Use  . Smoking status: Former Smoker    Packs/day: 0.50    Years: 4.00    Pack  years: 2.00    Types: Cigarettes    Quit date: 10/29/2005    Years since quitting: 14.1  . Smokeless tobacco: Never Used  . Tobacco comment: about a pack a week. social  Substance and Sexual Activity  . Alcohol use: Yes    Alcohol/week: 42.0 standard drinks    Types: 42 Cans of beer per week    Comment: 24 a week  . Drug use: Yes    Frequency: 7.0 times per week    Types: Marijuana    Comment: has discontinued  . Sexual activity: Yes    Partners: Female  Other Topics Concern  . Not on file  Social History Narrative   ** Merged History Encounter **       Social Determinants of Health   Financial Resource Strain:   . Difficulty of Paying Living Expenses:   Food Insecurity:   . Worried About Charity fundraiser in the Last Year:   . Arboriculturist in the Last Year:   Transportation Needs:   . Film/video editor (Medical):   Marland Kitchen Lack of Transportation (Non-Medical):   Physical Activity:   . Days of Exercise per Week:   . Minutes of Exercise per Session:   Stress:   . Feeling of Stress :   Social Connections:   . Frequency of Communication with Friends and Family:   . Frequency of Social Gatherings with Friends and Family:   . Attends Religious Services:   . Active Member of Clubs or Organizations:   . Attends Archivist Meetings:   Marland Kitchen Marital Status:   Intimate Partner Violence:   . Fear of Current or Ex-Partner:   . Emotionally Abused:   Marland Kitchen Physically Abused:   . Sexually Abused:     Physical Exam      Future Appointments  Date Time Provider [place]  12/04/2019  3:00 PM [facility] PA/NP [facility] None  12/18/2019 10:30 AM Libby Maw, MD [facility]  12/30/2019  7:20 AM [facility] DEVICE REMOTES [facility]  01/16/2020 11:00 AM Larey Dresser, MD [facility] None  03/30/2020  7:20 AM [facility] DEVICE REMOTES [facility]  06/29/2020  7:20 AM [facility] DEVICE REMOTES [facility]    BP 100/66  (BP Location: Left Arm, Patient Position: Sitting, Cuff Size: Normal)   Pulse 67   Resp 16   Wt (!) 312 lb (141.5 kg)   SpO2 93%   BMI 37.98 kg/m   Weight yesterday- 312 lb Last visit weight- 313 lb  Mr Christman was seen at home today and reported feeling well. He denied chest pain, Sob, headache, dizziness, orthopnea, fever or cough since our last visit. He stated he has been compliant with his medications over the past week and his weight has been stable. His medications were verified and his pillbox was refilled. I attempted to schedule him for  a new PCP, however we were unable to accomplish this today. We will work on this moving forward but he is in no immediate need. I will follow up next week.   Jacqualine Code, EMT 12/03/19  ACTION: Home visit completed Next visit planned for 1 week

## 2019-12-04 ENCOUNTER — Ambulatory Visit (HOSPITAL_COMMUNITY)
Admit: 2019-12-04 | Discharge: 2019-12-04 | Disposition: A | Discharge status: Home / Self Care | Payer: Medicare Other | Source: Ambulatory Visit | Attending: Cardiology | Admitting: Cardiology

## 2019-12-04 ENCOUNTER — Telehealth (HOSPITAL_COMMUNITY): Payer: Self-pay | Admitting: Licensed Clinical Social Worker

## 2019-12-04 ENCOUNTER — Encounter (HOSPITAL_COMMUNITY): Payer: Self-pay

## 2019-12-04 ENCOUNTER — Other Ambulatory Visit: Payer: Self-pay

## 2019-12-04 VITALS — BP 100/68 | HR 63 | Ht 76.0 in | Wt 316.4 lb

## 2019-12-04 DIAGNOSIS — I480 Paroxysmal atrial fibrillation: Secondary | ICD-10-CM | POA: Insufficient documentation

## 2019-12-04 DIAGNOSIS — Z95810 Presence of automatic (implantable) cardiac defibrillator: Secondary | ICD-10-CM | POA: Diagnosis not present

## 2019-12-04 DIAGNOSIS — Z87891 Personal history of nicotine dependence: Secondary | ICD-10-CM | POA: Insufficient documentation

## 2019-12-04 DIAGNOSIS — G4733 Obstructive sleep apnea (adult) (pediatric): Secondary | ICD-10-CM | POA: Insufficient documentation

## 2019-12-04 DIAGNOSIS — F1010 Alcohol abuse, uncomplicated: Secondary | ICD-10-CM | POA: Insufficient documentation

## 2019-12-04 DIAGNOSIS — I428 Other cardiomyopathies: Secondary | ICD-10-CM | POA: Insufficient documentation

## 2019-12-04 DIAGNOSIS — I110 Hypertensive heart disease with heart failure: Secondary | ICD-10-CM | POA: Insufficient documentation

## 2019-12-04 DIAGNOSIS — I5042 Chronic combined systolic (congestive) and diastolic (congestive) heart failure: Secondary | ICD-10-CM | POA: Insufficient documentation

## 2019-12-04 DIAGNOSIS — I2510 Atherosclerotic heart disease of native coronary artery without angina pectoris: Secondary | ICD-10-CM | POA: Insufficient documentation

## 2019-12-04 DIAGNOSIS — Z79899 Other long term (current) drug therapy: Secondary | ICD-10-CM | POA: Diagnosis not present

## 2019-12-04 DIAGNOSIS — Z8249 Family history of ischemic heart disease and other diseases of the circulatory system: Secondary | ICD-10-CM | POA: Insufficient documentation

## 2019-12-04 DIAGNOSIS — Z7901 Long term (current) use of anticoagulants: Secondary | ICD-10-CM | POA: Insufficient documentation

## 2019-12-04 DIAGNOSIS — I5022 Chronic systolic (congestive) heart failure: Secondary | ICD-10-CM | POA: Diagnosis not present

## 2019-12-04 DIAGNOSIS — I472 Ventricular tachycardia, unspecified: Secondary | ICD-10-CM

## 2019-12-04 DIAGNOSIS — M1990 Unspecified osteoarthritis, unspecified site: Secondary | ICD-10-CM | POA: Diagnosis not present

## 2019-12-04 DIAGNOSIS — J449 Chronic obstructive pulmonary disease, unspecified: Secondary | ICD-10-CM | POA: Insufficient documentation

## 2019-12-04 DIAGNOSIS — E876 Hypokalemia: Secondary | ICD-10-CM | POA: Diagnosis not present

## 2019-12-04 LAB — CBC
HCT: 33.6 % — ABNORMAL LOW (ref 39.0–52.0)
Hemoglobin: 10.8 g/dL — ABNORMAL LOW (ref 13.0–17.0)
MCH: 31.6 pg (ref 26.0–34.0)
MCHC: 32.1 g/dL (ref 30.0–36.0)
MCV: 98.2 fL (ref 80.0–100.0)
Platelets: 242 10*3/uL (ref 150–400)
RBC: 3.42 MIL/uL — ABNORMAL LOW (ref 4.22–5.81)
RDW: 13.9 % (ref 11.5–15.5)
WBC: 6.1 10*3/uL (ref 4.0–10.5)
nRBC: 0 % (ref 0.0–0.2)

## 2019-12-04 LAB — COMPREHENSIVE METABOLIC PANEL
ALT: 20 U/L (ref 0–44)
AST: 16 U/L (ref 15–41)
Albumin: 3.6 g/dL (ref 3.5–5.0)
Alkaline Phosphatase: 81 U/L (ref 38–126)
Anion gap: 10 (ref 5–15)
BUN: 19 mg/dL (ref 6–20)
CO2: 27 mmol/L (ref 22–32)
Calcium: 9.1 mg/dL (ref 8.9–10.3)
Chloride: 101 mmol/L (ref 98–111)
Creatinine, Ser: 1.4 mg/dL — ABNORMAL HIGH (ref 0.61–1.24)
GFR calc Af Amer: >60 mL/min (ref >60)
GFR calc non Af Amer: 56 mL/min — ABNORMAL LOW (ref >60)
Glucose, Bld: 115 mg/dL — ABNORMAL HIGH (ref 70–99)
Potassium: 4.6 mmol/L (ref 3.5–5.1)
Sodium: 138 mmol/L (ref 135–145)
Total Bilirubin: 1 mg/dL (ref 0.3–1.2)
Total Protein: 6.4 g/dL — ABNORMAL LOW (ref 6.5–8.1)

## 2019-12-04 MED ORDER — AMIODARONE HCL 200 MG PO TABS: 200.0000 mg | ORAL_TABLET | Freq: Every day | ORAL | 3 refills | Status: DC

## 2019-12-04 NOTE — Patient Instructions (Signed)
CHANGE Amiodarone to 200 mg, one tab daily  Labs today We will only contact you if something comes back abnormal or we need to make some changes. Otherwise no news is good news!  Keep follow up as scheduled  Do the following things EVERYDAY: 1) Weigh yourself in the morning before breakfast. Write it down and keep it in a log. 2) Take your medicines as prescribed 3) Eat low salt foods--Limit salt (sodium) to 2000 mg per day.  4) Stay as active as you can everyday 5) Limit all fluids for the day to less than 2 liters  At the [facility], you and your health needs are our priority. As part of our continuing mission to provide you with exceptional heart care, we have created designated Provider Care Teams. These Care Teams include your primary Cardiologist (physician) and Advanced Practice Providers (APPs- Physician Assistants and Nurse Practitioners) who all work together to provide you with the care you need, when you need it.   You may see any of the following providers on your designated Care Team at your next follow up: Marland Kitchen Dr Arvilla Meres . Dr Marca Ancona . Tonye Becket, NP . Robbie Lis, PA . Karle Plumber, PharmD   Please be sure to bring in all your medications bottles to every appointment.

## 2019-12-04 NOTE — Progress Notes (Signed)
[facility] Note   Referring Physician: PCP: [name], MD PCP-Cardiologist: Loralie Champagne, MD   HPI:  Mr Holderness is a 59 year old with history chronic systolic heart failure, NICM, BIV ICD, PAF, and VT.   The end of last year he was placed on farxiga but stopped after the second dose because he had an ICD shock.  Had shock x 2 1/1/21and 09/25/19. Dr Caryl Comes has been following and he was placed on amio 200 mg twice a day until 10/20/19.  He was seen in the HF clinic on 10/16/19. At that time he was stable. Amiodarone was cut back to 200 mg daily and sotalol was continued.   Recently admitted 11/19/19 for ICD shock. Device interrogation showed appropriate shock for VT. This was in the setting of hypokalemia. K 3.0. He had ran out of potassium, entresto, atorvastatin, and spironolactone. He was unable to afford the co-pay. Also heavy drinker. Had been drinking a case of beer per week.    EP admitted and he was started on amiodarone drip. K replaced. CXR with vascular congestion. Pertinent admission labs included: K 3, creatinine 1.2, HS Trop20, TSH 0.7, BNP 964. He had no further VT on tele w/ amiodarone and correction of hypokalemia. After IV amiodarone load, was placed on PO amiodarone w/ taper. From a CHF standpoint, he was diuresed w/ IV lasix and placed back on previous home diuretic regimen, torsemide 60 mg bid.   He presents back to clinic today for f/u. Her w/ paramedicine who has been following him closely since discharge. Back on meds and fully compliant. No further ICD shocks. Volume status stable. No edema. Denies orthopnea/ PND. No chest pain. VSS. Still drinking beer but has cut back, reports he had only 3 so far this week. Device interrogation shows no recurrent VT. He is euvolemic by Optivol analysis.     Review of Systems: [y] = yes, [ ]  = no   General: Weight gain [ ] ; Weight loss [ ] ; Anorexia [ ] ; Fatigue [ ] ; Fever [ ] ; Chills [ ] ; Weakness [ ]     Cardiac: Chest pain/pressure [ ] ; Resting SOB [ ] ; Exertional SOB [ ] ; Orthopnea [ ] ; Pedal Edema [ ] ; Palpitations [ ] ; Syncope [ ] ; Presyncope [ ] ; Paroxysmal nocturnal dyspnea[ ]   Pulmonary: Cough [ ] ; Wheezing[ ] ; Hemoptysis[ ] ; Sputum [ ] ; Snoring [ ]   GI: Vomiting[ ] ; Dysphagia[ ] ; Melena[ ] ; Hematochezia [ ] ; Heartburn[ ] ; Abdominal pain [ ] ; Constipation [ ] ; Diarrhea [ ] ; BRBPR [ ]   GU: Hematuria[ ] ; Dysuria [ ] ; Nocturia[ ]   Vascular: Pain in legs with walking [ ] ; Pain in feet with lying flat [ ] ; Non-healing sores [ ] ; Stroke [ ] ; TIA [ ] ; Slurred speech [ ] ;  Neuro: Headaches[ ] ; Vertigo[ ] ; Seizures[ ] ; Paresthesias[ ] ;Blurred vision [ ] ; Diplopia [ ] ; Vision changes [ ]   Ortho/Skin: Arthritis [ ] ; Joint pain [ ] ; Muscle pain [ ] ; Joint swelling [ ] ; Back Pain [ ] ; Rash [ ]   Psych: Depression[ ] ; Anxiety[ ]   Heme: Bleeding problems [ ] ; Clotting disorders [ ] ; Anemia [ ]   Endocrine: Diabetes [ ] ; Thyroid dysfunction[ ]    Past Medical History:  Diagnosis Date  . AICD (automatic cardioverter/defibrillator) present   . Arthritis   . CAD (coronary artery disease)    Mild nonobstructive (Cath 09)  . Chronic systolic heart failure (HCC)    Nonischemic CM: echo 4/12 with EF 25-30%, grade 2 diast  dysfxn, mild dilated aortic root 43 mm, trivial MR, mod LAE  . COPD (chronic obstructive pulmonary disease) (HCC)   . Dyspnea   . H/O ETOH abuse   . HTN (hypertension)   . Hypersomnia   . Obesity, morbid (HCC)   . OSA on CPAP   . Systolic and diastolic CHF, acute on chronic (HCC) 11/2016  . Tobacco use disorder   . Ventricular tachycardia (HCC)     Current Outpatient Medications  Medication Sig Dispense Refill  . acetaminophen (TYLENOL) 325 MG tablet Take 2 tablets (650 mg total) by mouth every 4 (four) hours as needed for headache or mild pain.    Marland Kitchen albuterol (VENTOLIN HFA) 108 (90 Base) MCG/ACT inhaler Inhale 1 puff into the lungs every 6 (six) hours as needed for wheezing or  shortness of breath. 8 g 3  . amiodarone (PACERONE) 200 MG tablet Take 1 tablet (200 mg total) by mouth daily. 90 tablet 3  . atorvastatin (LIPITOR) 80 MG tablet TAKE 1 TABLET BY MOUTH ONCE DAILY AT  6  PM 30 tablet 6  . magnesium oxide (MAG-OX) 400 (241.3 Mg) MG tablet Take 1 tablet (400 mg total) by mouth daily. 30 tablet 6  . mirtazapine (REMERON) 30 MG tablet 15 mg at bedtime.    . potassium chloride SA (KLOR-CON) 20 MEQ tablet Take 2 tablets (40 mEq total) by mouth 2 (two) times daily. 360 tablet 3  . rivaroxaban (XARELTO) 20 MG TABS tablet Take 1 tablet (20 mg total) by mouth daily with supper. 30 tablet 6  . sacubitril-valsartan (ENTRESTO) 49-51 MG Take 1 tablet by mouth 2 (two) times daily. 60 tablet 6  . sotalol (BETAPACE) 80 MG tablet Take 1 tablet (80 mg total) by mouth every 12 (twelve) hours. 60 tablet 6  . spironolactone (ALDACTONE) 25 MG tablet TAKE 1 TABLET BY MOUTH ONCE DAILY (NEED  OFFICE  VISIT  FOR  FUTURE  REFILLS) 30 tablet 6  . torsemide (DEMADEX) 20 MG tablet Take 3 tablets (60 mg total) by mouth 2 (two) times daily. 540 tablet 3  . tiZANidine (ZANAFLEX) 2 MG tablet Take 1 tablet (2 mg total) by mouth every 8 (eight) hours as needed for muscle spasms. (Patient not taking: Reported on 11/19/2019) 40 tablet 0   No current facility-administered medications for this encounter.    No Known Allergies    Social History   Socioeconomic History  . Marital status: Divorced    Spouse name: Not on file  . Number of children: 2  . Years of education: Not on file  . Highest education level: Not on file  Occupational History  . Occupation: Copy  . Occupation: disabled, odd jobs  Tobacco Use  . Smoking status: Former Smoker    Packs/day: 0.50    Years: 4.00    Pack years: 2.00    Types: Cigarettes    Quit date: 10/29/2005    Years since quitting: 14.1  . Smokeless tobacco: Never Used  . Tobacco comment: about a pack a week. social  Substance and Sexual Activity  .  Alcohol use: Yes    Alcohol/week: 42.0 standard drinks    Types: 42 Cans of beer per week    Comment: 24 a week  . Drug use: Yes    Frequency: 7.0 times per week    Types: Marijuana    Comment: has discontinued  . Sexual activity: Yes    Partners: Female  Other Topics Concern  . Not on file  Social History Narrative   ** Merged History Encounter **       Social Determinants of Health   Financial Resource Strain:   . Difficulty of Paying Living Expenses:   Food Insecurity:   . Worried About Programme researcher, broadcasting/film/video in the Last Year:   . Barista in the Last Year:   Transportation Needs: No Transportation Needs  . Lack of Transportation (Medical): No  . Lack of Transportation (Non-Medical): No  Physical Activity: Sufficiently Active  . Days of Exercise per Week: 7 days  . Minutes of Exercise per Session: 30 min  Stress:   . Feeling of Stress :   Social Connections:   . Frequency of Communication with Friends and Family:   . Frequency of Social Gatherings with Friends and Family:   . Attends Religious Services:   . Active Member of Clubs or Organizations:   . Attends Banker Meetings:   Marland Kitchen Marital Status:   Intimate Partner Violence:   . Fear of Current or Ex-Partner:   . Emotionally Abused:   Marland Kitchen Physically Abused:   . Sexually Abused:       Family History  Problem Relation Age of Onset  . Other Mother        cardiac surgery. late 1990s  . Heart Problems Mother        CABG AGE 52  . Congestive Heart Failure Father   . Healthy Father        AGE 49  . Coronary artery disease Other   . Healthy Brother        AGE 84  . Healthy Sister        AGE 39  . Healthy Sister        AGE 13  . Healthy Son   . Healthy Son   . Healthy Daughter     Vitals:   12/04/19 1520  BP: 100/68  Pulse: 63  SpO2: 97%  Weight: (!) 143.5 kg (316 lb 5.8 oz)  Height: 6\' 4"  (1.93 m)     PHYSICAL EXAM: General:  Well appearing, heafty middle aged WM No respiratory  difficulty HEENT: normal Neck: supple. no JVD. Carotids 2+ bilat; no bruits. No lymphadenopathy or thyromegaly appreciated. Cor: PMI nondisplaced. Regular rate & rhythm. No rubs, gallops or murmurs. Lungs: clear Abdomen: soft, nontender, nondistended. No hepatosplenomegaly. No bruits or masses. Good bowel sounds. Extremities: no cyanosis, clubbing, rash, edema Neuro: alert & oriented x 3, cranial nerves grossly intact. moves all 4 extremities w/o difficulty. Affect pleasant.  ECG:  NSR, 65 bpm    ASSESSMENT & PLAN:  1. VT: Recently Admitted 11/19/19 with recurrent VT in setting of CHF exacerbation and hypokalemia (K 3.0). This was in the setting of poor compliance w/ HF meds and KCl. Long history of VT. - Improved w/ IV amiodarone load + correction of hypokalemia. No further ICD shocks. No VT detected on device interrogation today.  - ?VT ablation in future. He will f/u w/ EP.  - Continue PO amiodarone 200 mg daily  - Continue sotalol 80 mg bid - Continue KCl + Mg supplementation.  - Check BMP today  2.Chronic Systolic CHF: NICM, Echo 12/2016 EF 15%. Normalcoronaries on cath inJuly 2016. Has Medtronic CRT-Ddevice.ETOH vs HTN vs myocarditis as cause of cardiomyopathy.Echoin 12/20 showed thatEF remains 20-25%.  -Volume status ok by exam and optivol. NYHA Class II -Continue torsemide 60 mg twice daily  -Continue Entresto 49-51 mg twice a day -Continue spironolactone 25  mg daily.  - Intolerant to Coreg in the past with hypotension, he is on sotalol and amiodarone. - Intolerant to [place]  - His BP is too soft currently for Entresto titration  - He needs to continue to cut back on ETOH. We discussed this again today - Continue parmamedine to help w/ med compliance and monitoring of volume status  3. PAF: in SR on EKG today. HR controlled  -Continue amiodarone and sotalol.   - Continue Xarelto 20 mg daily.No bleeding but drinks ETHO. Check CBC today  4. ETOH abuse: Suspect  this contributes to cardiomyopathy.Encouraged him to quit. 5. Tobacco abuse:No longer smoking. 6. OSA: Continue nightly CPAP.Reports full compliance   F/u in 6 weeks w/ Dr. Lillia Mountain, PA-C 12/04/19

## 2019-12-04 NOTE — Telephone Encounter (Signed)
Pt would like to change PCPs at this time and would like assistance finding a new one that is in network with his insurance.  CSW able to get pt establish PCP appt with Primary Care at [facility].  CSW will continue follow and assist as needed  [name], LCSW Clinical Social Worker [facility] Desk#: [phone] Cell#: [phone]

## 2019-12-04 NOTE — Progress Notes (Signed)
Duplicate. See other progress note

## 2019-12-05 ENCOUNTER — Other Ambulatory Visit (HOSPITAL_COMMUNITY): Payer: Self-pay

## 2019-12-05 MED ORDER — ALBUTEROL SULFATE HFA 108 (90 BASE) MCG/ACT IN AERS: 1.0000 | INHALATION_SPRAY | Freq: Four times a day (QID) | RESPIRATORY_TRACT | 3 refills | Status: DC | PRN

## 2019-12-05 NOTE — Telephone Encounter (Signed)
Rx for inhaler rerouted from [facility] to [facility] outpatient as [facility] did not have ordered dose.

## 2019-12-09 ENCOUNTER — Other Ambulatory Visit (HOSPITAL_COMMUNITY): Payer: Self-pay | Admitting: *Deleted

## 2019-12-09 ENCOUNTER — Telehealth (HOSPITAL_COMMUNITY): Payer: Self-pay | Admitting: *Deleted

## 2019-12-09 ENCOUNTER — Telehealth (HOSPITAL_COMMUNITY): Payer: Self-pay

## 2019-12-09 MED ORDER — POTASSIUM CHLORIDE CRYS ER 20 MEQ PO TBCR: 60.0000 meq | EXTENDED_RELEASE_TABLET | Freq: Every day | ORAL | 3 refills | Status: DC

## 2019-12-09 MED ORDER — TORSEMIDE 20 MG PO TABS: 60.0000 mg | ORAL_TABLET | Freq: Every day | ORAL | 3 refills | Status: DC

## 2019-12-09 MED ORDER — ALBUTEROL SULFATE HFA 108 (90 BASE) MCG/ACT IN AERS: 1.0000 | INHALATION_SPRAY | Freq: Four times a day (QID) | RESPIRATORY_TRACT | 3 refills | Status: DC | PRN

## 2019-12-09 NOTE — Telephone Encounter (Signed)
Modesta Messing, [place]  12/09/2019 10:07 AM EDT    Pt aware and agreeable with plan. Lab appt scheduled.    Theresia Bough, [place]  12/05/2019 4:21 PM EDT    [phone] Judie Petit) LMOM   Robbie Lis [place], PA-C  12/04/2019 8:36 PM EDT    Labs show AKI. Scr has increased from 1.0>>1.4. Hold Torsemide and KCl x 1 day. Then resume Torsemide at lower dose of 60 mg once daily and KCl to 60 mEq daily. Repeat BMP in 1 week. Follow wt closely.

## 2019-12-09 NOTE — Telephone Encounter (Signed)
-----   Message from Allayne Butcher, [place] sent at 12/04/2019  8:36 PM EDT ----- Labs show AKI. Scr has increased from 1.0>>1.4. Hold Torsemide and KCl x 1 day. Then resume Torsemide at lower dose of 60 mg once daily and KCl to 60 mEq daily. Repeat BMP in 1 week. Follow wt closely.

## 2019-12-09 NOTE — Telephone Encounter (Signed)
I called [name] [name] to schedule an appointment for tomorrow. He stated he would be available tomorrow morning so we agreed to meet at 11:00.   Jacqualine Code, EMT 12/09/19

## 2019-12-10 ENCOUNTER — Telehealth (HOSPITAL_COMMUNITY): Payer: Self-pay | Admitting: *Deleted

## 2019-12-10 ENCOUNTER — Other Ambulatory Visit (HOSPITAL_COMMUNITY): Payer: Self-pay

## 2019-12-10 ENCOUNTER — Other Ambulatory Visit (HOSPITAL_COMMUNITY): Payer: Self-pay | Admitting: *Deleted

## 2019-12-10 MED ORDER — ENTRESTO 24-26 MG PO TABS: 1.0000 | ORAL_TABLET | Freq: Two times a day (BID) | ORAL | 3 refills | Status: DC

## 2019-12-10 NOTE — Telephone Encounter (Signed)
[name] with paramedicine called to report pts blood pressure is 86/60 and he is dizzy. Per Dr. Shirlee Latch hold evening dose of entresto and decrease entresto to 24/26mg  twice daily.

## 2019-12-10 NOTE — Progress Notes (Signed)
Paramedicine Encounter    Patient ID: [name] [name], male    DOB: Apr 10, 1961, 59 y.o.   MRN: [id number]   Patient Care Team: [name], MD as PCP - General (Family Medicine) [name], [name], MD as PCP - Advanced Heart Failure (Cardiology) [name], MD as PCP - Electrophysiology (Cardiology) [name], MD as PCP - Cardiology (Cardiology) [name], MD (Family Medicine) [name], LCSW as Social Worker (Licensed Clinical Social Worker)  Patient Active Problem List   Diagnosis Date Noted  . Hypokalemia 11/23/2019  . Ventricular tachycardia (HCC) 11/19/2019  . Primary osteoarthritis of right hip 11/03/2018  . VT (ventricular tachycardia) (HCC) 05/30/2017  . AKI (acute kidney injury) (HCC)   . Acute on chronic systolic CHF (congestive heart failure) (HCC) 01/29/2017  . HTN (hypertension) 12/25/2016  . Obesity 12/25/2016  . OSA (obstructive sleep apnea) 12/25/2016  . Hyperglycemia 12/25/2016  . Acute respiratory failure with hypoxia (HCC) 12/25/2016  . Chronic anticoagulation 06/27/2016  . Encounter for therapeutic drug monitoring 04/19/2015  . S/P ICD (internal cardiac defibrillator) procedure Medtronic CRT ICD 04/15/2015  . Atrial fibrillation with RVR - Paroxysmal 04/08/2015  . NICM 04/06/2015  . Troponin level elevated 04/06/2015  . Rectal bleeding 10/26/2011  . Hydrocele, left 05/24/2011  . Tobacco use disorder- 1/2 pk a day   . Alcohol abuse   . Obesity 09/27/2008  . CAD- minor CAD 2009 and 2016 09/27/2008  . VENTRICULAR TACHYCARDIA 09/27/2008  . Essential hypertension 06/19/2008  . Chronic systolic heart failure (HCC) 06/19/2008    Current Outpatient Medications:  .  acetaminophen (TYLENOL) 325 MG tablet, Take 2 tablets (650 mg total) by mouth every 4 (four) hours as needed for headache or mild pain., Disp: , Rfl:  .  albuterol (VENTOLIN HFA) 108 (90 Base) MCG/ACT inhaler, Inhale 1 puff into the lungs every 6 (six) hours as  needed for wheezing or shortness of breath., Disp: 18 g, Rfl: 3 .  amiodarone (PACERONE) 200 MG tablet, Take 1 tablet (200 mg total) by mouth daily., Disp: 90 tablet, Rfl: 3 .  atorvastatin (LIPITOR) 80 MG tablet, TAKE 1 TABLET BY MOUTH ONCE DAILY AT  6  PM, Disp: 30 tablet, Rfl: 6 .  magnesium oxide (MAG-OX) 400 (241.3 Mg) MG tablet, Take 1 tablet (400 mg total) by mouth daily., Disp: 30 tablet, Rfl: 6 .  mirtazapine (REMERON) 30 MG tablet, 15 mg at bedtime., Disp: , Rfl:  .  potassium chloride SA (KLOR-CON) 20 MEQ tablet, Take 3 tablets (60 mEq total) by mouth daily., Disp: 360 tablet, Rfl: 3 .  rivaroxaban (XARELTO) 20 MG TABS tablet, Take 1 tablet (20 mg total) by mouth daily with supper., Disp: 30 tablet, Rfl: 6 .  sacubitril-valsartan (ENTRESTO) 49-51 MG, Take 1 tablet by mouth 2 (two) times daily., Disp: 60 tablet, Rfl: 6 .  sotalol (BETAPACE) 80 MG tablet, Take 1 tablet (80 mg total) by mouth every 12 (twelve) hours., Disp: 60 tablet, Rfl: 6 .  spironolactone (ALDACTONE) 25 MG tablet, TAKE 1 TABLET BY MOUTH ONCE DAILY (NEED  OFFICE  VISIT  FOR  FUTURE  REFILLS), Disp: 30 tablet, Rfl: 6 .  torsemide (DEMADEX) 20 MG tablet, Take 3 tablets (60 mg total) by mouth daily., Disp: 540 tablet, Rfl: 3 .  tiZANidine (ZANAFLEX) 2 MG tablet, Take 1 tablet (2 mg total) by mouth every 8 (eight) hours as needed for muscle spasms. (Patient not taking: Reported on 11/19/2019), Disp: 40 tablet, Rfl: 0 No Known  Allergies    Social History   Socioeconomic History  . Marital status: Divorced    Spouse name: Not on file  . Number of children: 2  . Years of education: Not on file  . Highest education level: Not on file  Occupational History  . Occupation: Air traffic controller  . Occupation: disabled, odd jobs  Tobacco Use  . Smoking status: Former Smoker    Packs/day: 0.50    Years: 4.00    Pack years: 2.00    Types: Cigarettes    Quit date: 10/29/2005    Years since quitting: 14.1  . Smokeless tobacco: Never  Used  . Tobacco comment: about a pack a week. social  Substance and Sexual Activity  . Alcohol use: Yes    Alcohol/week: 42.0 standard drinks    Types: 42 Cans of beer per week    Comment: 24 a week  . Drug use: Yes    Frequency: 7.0 times per week    Types: Marijuana    Comment: has discontinued  . Sexual activity: Yes    Partners: Female  Other Topics Concern  . Not on file  Social History Narrative   ** Merged History Encounter **       Social Determinants of Health   Financial Resource Strain:   . Difficulty of Paying Living Expenses:   Food Insecurity:   . Worried About Charity fundraiser in the Last Year:   . Arboriculturist in the Last Year:   Transportation Needs: No Transportation Needs  . Lack of Transportation (Medical): No  . Lack of Transportation (Non-Medical): No  Physical Activity: Sufficiently Active  . Days of Exercise per Week: 7 days  . Minutes of Exercise per Session: 30 min  Stress:   . Feeling of Stress :   Social Connections:   . Frequency of Communication with Friends and Family:   . Frequency of Social Gatherings with Friends and Family:   . Attends Religious Services:   . Active Member of Clubs or Organizations:   . Attends Archivist Meetings:   Marland Kitchen Marital Status:   Intimate Partner Violence:   . Fear of Current or Ex-Partner:   . Emotionally Abused:   Marland Kitchen Physically Abused:   . Sexually Abused:     Physical Exam Cardiovascular:     Rate and Rhythm: Normal rate and regular rhythm.     Pulses: Normal pulses.  Pulmonary:     Effort: Pulmonary effort is normal.     Breath sounds: Normal breath sounds.  Musculoskeletal:        General: Normal range of motion.     Right lower leg: No edema.     Left lower leg: No edema.  Skin:    General: Skin is warm and dry.     Capillary Refill: Capillary refill takes less than 2 seconds.  Neurological:     Mental Status: He is alert and oriented to person, place, and time.   Psychiatric:        Mood and Affect: Mood normal.         Future Appointments  Date Time Provider [place]  12/15/2019 12:00 PM [facility] None  12/30/2019  7:20 AM [facility] DEVICE REMOTES [facility]  01/14/2020  1:50 PM Nicolette Bang, DO [facility] None  01/16/2020 11:00 AM Larey Dresser, MD [facility] None  03/30/2020  7:20 AM [facility] DEVICE REMOTES [facility]  06/29/2020  7:20 AM [facility]ICE REMOTES  [facility]    BP (!) 86/60 (BP Location: Left Arm, Patient Position: Sitting, Cuff Size: Large)   Pulse 64   Resp 16   Wt (!) 315 lb (142.9 kg)   SpO2 98%   BMI 38.34 kg/m   Weight yesterday- 314 lb Last visit weight- 316 lb  Mr Satterly was seen at home today and reported feeling generally well. He denied chest pain, SOB, headache, orthopnea, fever or cough over the past week but stated he has been having dizziness. The dizziness typically comes when he stands up or bends over but resolves after a few minutes. I contacted the HF clinic who advised to hold Entresto tonight and decrease his dose starting tomorrow. He his medications were verified and his pillbox was refilled with the necessary changes. I will follow up next week.   Jacqualine Code, EMT 12/10/19  ACTION: Home visit completed Next visit planned for 1 week

## 2019-12-15 ENCOUNTER — Other Ambulatory Visit (HOSPITAL_COMMUNITY): Payer: Self-pay | Admitting: *Deleted

## 2019-12-15 ENCOUNTER — Ambulatory Visit (HOSPITAL_COMMUNITY)
Admission: RE | Admit: 2019-12-15 | Discharge: 2019-12-15 | Disposition: A | Discharge status: Home / Self Care | Payer: Medicare Other | Source: Ambulatory Visit | Attending: Cardiology | Admitting: Cardiology

## 2019-12-15 ENCOUNTER — Other Ambulatory Visit: Payer: Self-pay

## 2019-12-15 DIAGNOSIS — I5022 Chronic systolic (congestive) heart failure: Secondary | ICD-10-CM | POA: Diagnosis not present

## 2019-12-15 LAB — BASIC METABOLIC PANEL
Anion gap: 9 (ref 5–15)
BUN: 11 mg/dL (ref 6–20)
CO2: 29 mmol/L (ref 22–32)
Calcium: 9.1 mg/dL (ref 8.9–10.3)
Chloride: 104 mmol/L (ref 98–111)
Creatinine, Ser: 1.05 mg/dL (ref 0.61–1.24)
GFR calc Af Amer: >60 mL/min (ref >60)
GFR calc non Af Amer: >60 mL/min (ref >60)
Glucose, Bld: 99 mg/dL (ref 70–99)
Potassium: 4.1 mmol/L (ref 3.5–5.1)
Sodium: 142 mmol/L (ref 135–145)

## 2019-12-17 ENCOUNTER — Other Ambulatory Visit: Payer: Self-pay

## 2019-12-17 ENCOUNTER — Other Ambulatory Visit (HOSPITAL_COMMUNITY): Payer: Self-pay

## 2019-12-17 ENCOUNTER — Other Ambulatory Visit (HOSPITAL_COMMUNITY): Payer: Self-pay | Admitting: *Deleted

## 2019-12-17 MED ORDER — MAGNESIUM OXIDE 400 (241.3 MG) MG PO TABS: 400.0000 mg | ORAL_TABLET | Freq: Every day | ORAL | 3 refills | Status: DC

## 2019-12-17 NOTE — Progress Notes (Signed)
Paramedicine Encounter    Patient ID: [name], male    DOB: 08-07-1961, 59 y.o.   MRN: [id number]   Patient Care Team: [name], MD as PCP - General (Family Medicine) [name], [name], MD as PCP - Advanced Heart Failure (Cardiology) [name], MD as PCP - Electrophysiology (Cardiology) [name], MD as PCP - Cardiology (Cardiology) [name], MD (Family Medicine) [name], LCSW as Social Worker (Licensed Clinical Social Worker)  Patient Active Problem List   Diagnosis Date Noted  . Hypokalemia 11/23/2019  . Ventricular tachycardia (HCC) 11/19/2019  . Primary osteoarthritis of right hip 11/03/2018  . VT (ventricular tachycardia) (HCC) 05/30/2017  . AKI (acute kidney injury) (HCC)   . Acute on chronic systolic CHF (congestive heart failure) (HCC) 01/29/2017  . HTN (hypertension) 12/25/2016  . Obesity 12/25/2016  . OSA (obstructive sleep apnea) 12/25/2016  . Hyperglycemia 12/25/2016  . Acute respiratory failure with hypoxia (HCC) 12/25/2016  . Chronic anticoagulation 06/27/2016  . Encounter for therapeutic drug monitoring 04/19/2015  . S/P ICD (internal cardiac defibrillator) procedure Medtronic CRT ICD 04/15/2015  . Atrial fibrillation with RVR - Paroxysmal 04/08/2015  . NICM 04/06/2015  . Troponin level elevated 04/06/2015  . Rectal bleeding 10/26/2011  . Hydrocele, left 05/24/2011  . Tobacco use disorder- 1/2 pk a day   . Alcohol abuse   . Obesity 09/27/2008  . CAD- minor CAD 2009 and 2016 09/27/2008  . VENTRICULAR TACHYCARDIA 09/27/2008  . Essential hypertension 06/19/2008  . Chronic systolic heart failure (HCC) 06/19/2008    Current Outpatient Medications:  .  acetaminophen (TYLENOL) 325 MG tablet, Take 2 tablets (650 mg total) by mouth every 4 (four) hours as needed for headache or mild pain., Disp: , Rfl:  .  albuterol (VENTOLIN HFA) 108 (90 Base) MCG/ACT inhaler, Inhale 1 puff into the lungs every 6 (six) hours as  needed for wheezing or shortness of breath., Disp: 18 g, Rfl: 3 .  amiodarone (PACERONE) 200 MG tablet, Take 1 tablet (200 mg total) by mouth daily., Disp: 90 tablet, Rfl: 3 .  atorvastatin (LIPITOR) 80 MG tablet, TAKE 1 TABLET BY MOUTH ONCE DAILY AT  6  PM, Disp: 30 tablet, Rfl: 6 .  mirtazapine (REMERON) 30 MG tablet, 15 mg at bedtime., Disp: , Rfl:  .  potassium chloride SA (KLOR-CON) 20 MEQ tablet, Take 3 tablets (60 mEq total) by mouth daily., Disp: 360 tablet, Rfl: 3 .  rivaroxaban (XARELTO) 20 MG TABS tablet, Take 1 tablet (20 mg total) by mouth daily with supper., Disp: 30 tablet, Rfl: 6 .  sacubitril-valsartan (ENTRESTO) 24-26 MG, Take 1 tablet by mouth 2 (two) times daily., Disp: 60 tablet, Rfl: 3 .  sotalol (BETAPACE) 80 MG tablet, Take 1 tablet (80 mg total) by mouth every 12 (twelve) hours., Disp: 60 tablet, Rfl: 6 .  spironolactone (ALDACTONE) 25 MG tablet, TAKE 1 TABLET BY MOUTH ONCE DAILY (NEED  OFFICE  VISIT  FOR  FUTURE  REFILLS), Disp: 30 tablet, Rfl: 6 .  torsemide (DEMADEX) 20 MG tablet, Take 3 tablets (60 mg total) by mouth daily., Disp: 540 tablet, Rfl: 3 .  magnesium oxide (MAG-OX) 400 (241.3 Mg) MG tablet, Take 1 tablet (400 mg total) by mouth daily., Disp: 90 tablet, Rfl: 3 .  tiZANidine (ZANAFLEX) 2 MG tablet, Take 1 tablet (2 mg total) by mouth every 8 (eight) hours as needed for muscle spasms. (Patient not taking: Reported on 11/19/2019), Disp: 40 tablet, Rfl: 0 No Known  Allergies    Social History   Socioeconomic History  . Marital status: Divorced    Spouse name: Not on file  . Number of children: 2  . Years of education: Not on file  . Highest education level: Not on file  Occupational History  . Occupation: Copy  . Occupation: disabled, odd jobs  Tobacco Use  . Smoking status: Former Smoker    Packs/day: 0.50    Years: 4.00    Pack years: 2.00    Types: Cigarettes    Quit date: 10/29/2005    Years since quitting: 14.1  . Smokeless tobacco: Never  Used  . Tobacco comment: about a pack a week. social  Substance and Sexual Activity  . Alcohol use: Yes    Alcohol/week: 42.0 standard drinks    Types: 42 Cans of beer per week    Comment: 24 a week  . Drug use: Yes    Frequency: 7.0 times per week    Types: Marijuana    Comment: has discontinued  . Sexual activity: Yes    Partners: Female  Other Topics Concern  . Not on file  Social History Narrative   ** Merged History Encounter **       Social Determinants of Health   Financial Resource Strain:   . Difficulty of Paying Living Expenses:   Food Insecurity:   . Worried About Programme researcher, broadcasting/film/video in the Last Year:   . Barista in the Last Year:   Transportation Needs: No Transportation Needs  . Lack of Transportation (Medical): No  . Lack of Transportation (Non-Medical): No  Physical Activity: Sufficiently Active  . Days of Exercise per Week: 7 days  . Minutes of Exercise per Session: 30 min  Stress:   . Feeling of Stress :   Social Connections:   . Frequency of Communication with Friends and Family:   . Frequency of Social Gatherings with Friends and Family:   . Attends Religious Services:   . Active Member of Clubs or Organizations:   . Attends Banker Meetings:   Marland Kitchen Marital Status:   Intimate Partner Violence:   . Fear of Current or Ex-Partner:   . Emotionally Abused:   Marland Kitchen Physically Abused:   . Sexually Abused:     Physical Exam Cardiovascular:     Rate and Rhythm: Normal rate and regular rhythm.     Pulses: Normal pulses.  Pulmonary:     Effort: Pulmonary effort is normal.     Breath sounds: Examination of the right-lower field reveals rhonchi. Examination of the left-lower field reveals rhonchi. Rhonchi present.  Musculoskeletal:        General: Normal range of motion.     Right lower leg: No edema.     Left lower leg: No edema.  Skin:    General: Skin is warm and dry.     Capillary Refill: Capillary refill takes less than 2 seconds.   Neurological:     Mental Status: He is alert and oriented to person, place, and time.  Psychiatric:        Mood and Affect: Mood normal.         Future Appointments  Date Time Provider Department Center  12/30/2019  7:20 AM [facility]  01/14/2020  1:50 PM Arvilla Market, DO [facility] None  01/16/2020 11:00 AM [name], MD [facility] None  03/30/2020  7:20 AM [facility]ICE REM[facility]  06/29/2020  7:20 AM [facility]    BP (!) 91/55 (BP Location: Left Arm, Patient Position: Sitting, Cuff Size: Normal)   Pulse 69   Resp 16   Wt (!) 317 lb (143.8 kg)   SpO2 98%   BMI 38.59 kg/m   Weight yesterday- 315 Last visit weight- 315 lb  Mr Oleary was seen at home today and reported feeling well. He denied chest pain, headache, dizziness, orthopnea, fever or cough over the past week but is having some exertional dyspnea. Specifically, he state he has to take a break if he is walking >100 feet. He stated he has been compliant with his medications and his weight has been stable. His medications were verified and his pillbox was refilled. I will follow up next week.   Jacquiline Doe, EMT 12/17/19  ACTION: Home visit completed Next visit planned for 1 week

## 2019-12-17 NOTE — Telephone Encounter (Signed)
Requesting a 90 day supply, less expensive for the pt. Please address

## 2019-12-17 NOTE — Telephone Encounter (Signed)
This is a CHF pt, Dr. [name] 

## 2019-12-18 ENCOUNTER — Ambulatory Visit: Payer: Medicare Other | Admitting: Family Medicine

## 2019-12-24 ENCOUNTER — Other Ambulatory Visit (HOSPITAL_COMMUNITY): Payer: Self-pay | Admitting: *Deleted

## 2019-12-24 ENCOUNTER — Other Ambulatory Visit (HOSPITAL_COMMUNITY): Payer: Self-pay

## 2019-12-24 DIAGNOSIS — I472 Ventricular tachycardia, unspecified: Secondary | ICD-10-CM

## 2019-12-24 MED ORDER — AMIODARONE HCL 200 MG PO TABS: 200.0000 mg | ORAL_TABLET | Freq: Every day | ORAL | 3 refills | Status: DC

## 2019-12-24 NOTE — Progress Notes (Signed)
Paramedicine Encounter    Patient ID: [name] [name], male    DOB: [birth year], 59 y.o.   MRN: [id number]   Patient Care Team: [name], MD as PCP - General (Family Medicine) [name], [name], MD as PCP - Advanced Heart Failure (Cardiology) [name], MD as PCP - Electrophysiology (Cardiology) [name], MD as PCP - Cardiology (Cardiology) [name], MD (Family Medicine) [name], LCSW as Social Worker (Licensed Clinical Social Worker)  Patient Active Problem List   Diagnosis Date Noted  . Hypokalemia 11/23/2019  . Ventricular tachycardia (HCC) 11/19/2019  . Primary osteoarthritis of right hip 11/03/2018  . VT (ventricular tachycardia) (HCC) 05/30/2017  . AKI (acute kidney injury) (HCC)   . Acute on chronic systolic CHF (congestive heart failure) (HCC) 01/29/2017  . HTN (hypertension) 12/25/2016  . Obesity 12/25/2016  . OSA (obstructive sleep apnea) 12/25/2016  . Hyperglycemia 12/25/2016  . Acute respiratory failure with hypoxia (HCC) 12/25/2016  . Chronic anticoagulation 06/27/2016  . Encounter for therapeutic drug monitoring 04/19/2015  . S/P ICD (internal cardiac defibrillator) procedure Medtronic CRT ICD 04/15/2015  . Atrial fibrillation with RVR - Paroxysmal 04/08/2015  . NICM 04/06/2015  . Troponin level elevated 04/06/2015  . Rectal bleeding 10/26/2011  . Hydrocele, left 05/24/2011  . Tobacco use disorder- 1/2 pk a day   . Alcohol abuse   . Obesity 09/27/2008  . CAD- minor CAD 2009 and 2016 09/27/2008  . VENTRICULAR TACHYCARDIA 09/27/2008  . Essential hypertension 06/19/2008  . Chronic systolic heart failure (HCC) 06/19/2008    Current Outpatient Medications:  .  acetaminophen (TYLENOL) 325 MG tablet, Take 2 tablets (650 mg total) by mouth every 4 (four) hours as needed for headache or mild pain., Disp: , Rfl:  .  albuterol (VENTOLIN HFA) 108 (90 Base) MCG/ACT inhaler, Inhale 1 puff into the lungs every 6 (six) hours as  needed for wheezing or shortness of breath., Disp: 18 g, Rfl: 3 .  atorvastatin (LIPITOR) 80 MG tablet, TAKE 1 TABLET BY MOUTH ONCE DAILY AT  6  PM, Disp: 30 tablet, Rfl: 6 .  mirtazapine (REMERON) 30 MG tablet, 15 mg at bedtime., Disp: , Rfl:  .  potassium chloride SA (KLOR-CON) 20 MEQ tablet, Take 3 tablets (60 mEq total) by mouth daily., Disp: 360 tablet, Rfl: 3 .  rivaroxaban (XARELTO) 20 MG TABS tablet, Take 1 tablet (20 mg total) by mouth daily with supper., Disp: 30 tablet, Rfl: 6 .  sacubitril-valsartan (ENTRESTO) 24-26 MG, Take 1 tablet by mouth 2 (two) times daily., Disp: 60 tablet, Rfl: 3 .  sotalol (BETAPACE) 80 MG tablet, Take 1 tablet (80 mg total) by mouth every 12 (twelve) hours., Disp: 60 tablet, Rfl: 6 .  spironolactone (ALDACTONE) 25 MG tablet, TAKE 1 TABLET BY MOUTH ONCE DAILY (NEED  OFFICE  VISIT  FOR  FUTURE  REFILLS), Disp: 30 tablet, Rfl: 6 .  torsemide (DEMADEX) 20 MG tablet, Take 3 tablets (60 mg total) by mouth daily., Disp: 540 tablet, Rfl: 3 .  amiodarone (PACERONE) 200 MG tablet, Take 1 tablet (200 mg total) by mouth daily., Disp: 90 tablet, Rfl: 3 .  magnesium oxide (MAG-OX) 400 (241.3 Mg) MG tablet, Take 1 tablet (400 mg total) by mouth daily. (Patient not taking: Reported on 12/24/2019), Disp: 90 tablet, Rfl: 3 .  tiZANidine (ZANAFLEX) 2 MG tablet, Take 1 tablet (2 mg total) by mouth every 8 (eight) hours as needed for muscle spasms. (Patient not taking: Reported on 11/19/2019), Disp:  40 tablet, Rfl: 0 No Known Allergies    Social History   Socioeconomic History  . Marital status: Divorced    Spouse name: Not on file  . Number of children: 2  . Years of education: Not on file  . Highest education level: Not on file  Occupational History  . Occupation: Copy  . Occupation: disabled, odd jobs  Tobacco Use  . Smoking status: Former Smoker    Packs/day: 0.50    Years: 4.00    Pack years: 2.00    Types: Cigarettes    Quit date: 10/29/2005    Years since  quitting: 14.1  . Smokeless tobacco: Never Used  . Tobacco comment: about a pack a week. social  Substance and Sexual Activity  . Alcohol use: Yes    Alcohol/week: 42.0 standard drinks    Types: 42 Cans of beer per week    Comment: 24 a week  . Drug use: Yes    Frequency: 7.0 times per week    Types: Marijuana    Comment: has discontinued  . Sexual activity: Yes    Partners: Female  Other Topics Concern  . Not on file  Social History Narrative   ** Merged History Encounter **       Social Determinants of Health   Financial Resource Strain:   . Difficulty of Paying Living Expenses:   Food Insecurity:   . Worried About Programme researcher, broadcasting/film/video in the Last Year:   . Barista in the Last Year:   Transportation Needs: No Transportation Needs  . Lack of Transportation (Medical): No  . Lack of Transportation (Non-Medical): No  Physical Activity: Sufficiently Active  . Days of Exercise per Week: 7 days  . Minutes of Exercise per Session: 30 min  Stress:   . Feeling of Stress :   Social Connections:   . Frequency of Communication with Friends and Family:   . Frequency of Social Gatherings with Friends and Family:   . Attends Religious Services:   . Active Member of Clubs or Organizations:   . Attends Banker Meetings:   Marland Kitchen Marital Status:   Intimate Partner Violence:   . Fear of Current or Ex-Partner:   . Emotionally Abused:   Marland Kitchen Physically Abused:   . Sexually Abused:     Physical Exam Cardiovascular:     Rate and Rhythm: Normal rate and regular rhythm.     Pulses: Normal pulses.  Pulmonary:     Effort: Pulmonary effort is normal.     Breath sounds: Normal breath sounds.  Musculoskeletal:        General: Normal range of motion.     Right lower leg: No edema.     Left lower leg: No edema.  Skin:    General: Skin is warm and dry.     Capillary Refill: Capillary refill takes less than 2 seconds.  Neurological:     Mental Status: He is alert and  oriented to person, place, and time.  Psychiatric:        Mood and Affect: Mood normal.         Future Appointments  Date Time Provider Department Center  12/30/2019  7:20 AM [facility]  01/14/2020  1:50 PM Arvilla Market, DO [facility] None  01/16/2020 11:00 AM [name], MD [facility] None  03/30/2020  7:20 AM [facility]ICE REMOT[facility]  06/29/2020  7:20 AM [facility]  BP 104/70 (BP Location: Left Arm, Patient Position: Sitting, Cuff Size: Normal)   Pulse 65   Resp 16   Wt (!) 318 lb (144.2 kg)   SpO2 98%   BMI 38.71 kg/m   Weight yesterday- 317 lb Last visit weight- 317 lb  Mr Kienast was seen at home today and reported feeling well. He denied chest pain, SOB, headache, dizziness, orthopnea, fever or cough over the past week. He stated he has been compliant with his medications and his weight has been stable. His medications were verified and his pillbox was refilled. I will follow up next week.   Jacquiline Doe, EMT 12/24/19  ACTION: Home visit completed Next visit planned for 1 week

## 2019-12-30 ENCOUNTER — Ambulatory Visit (INDEPENDENT_AMBULATORY_CARE_PROVIDER_SITE_OTHER): Payer: Medicare Other | Admitting: *Deleted

## 2019-12-30 ENCOUNTER — Telehealth (HOSPITAL_COMMUNITY): Payer: Self-pay

## 2019-12-30 DIAGNOSIS — I472 Ventricular tachycardia, unspecified: Secondary | ICD-10-CM

## 2019-12-30 LAB — CUP PACEART REMOTE DEVICE CHECK
Battery Remaining Longevity: 15 mo
Battery Voltage: 2.92 V
Brady Statistic AP VP Percent: 1.49 %
Brady Statistic AP VS Percent: 15.8 %
Brady Statistic AS VP Percent: 81.68 %
Brady Statistic AS VS Percent: 1.03 %
Brady Statistic RA Percent Paced: 17.05 %
Brady Statistic RV Percent Paced: 82.08 %
Date Time Interrogation Session: 20210413002205
HighPow Impedance: 63 Ohm
Implantable Lead Implant Date: 20160727
Implantable Lead Implant Date: 20160727
Implantable Lead Implant Date: 20160727
Implantable Lead Location: 753858
Implantable Lead Location: 753859
Implantable Lead Location: 753860
Implantable Lead Model: 4598
Implantable Lead Model: 5076
Implantable Lead Model: 6935
Implantable Pulse Generator Implant Date: 20160727
Lead Channel Impedance Value: 342 Ohm
Lead Channel Impedance Value: 342 Ohm
Lead Channel Impedance Value: 361 Ohm
Lead Channel Impedance Value: 399 Ohm
Lead Channel Impedance Value: 399 Ohm
Lead Channel Impedance Value: 399 Ohm
Lead Channel Impedance Value: 399 Ohm
Lead Channel Impedance Value: 418 Ohm
Lead Channel Impedance Value: 589 Ohm
Lead Channel Impedance Value: 589 Ohm
Lead Channel Impedance Value: 646 Ohm
Lead Channel Impedance Value: 646 Ohm
Lead Channel Impedance Value: 646 Ohm
Lead Channel Pacing Threshold Amplitude: 0.5 V
Lead Channel Pacing Threshold Amplitude: 0.625 V
Lead Channel Pacing Threshold Amplitude: 1.625 V
Lead Channel Pacing Threshold Pulse Width: 0.4 ms
Lead Channel Pacing Threshold Pulse Width: 0.4 ms
Lead Channel Pacing Threshold Pulse Width: 0.6 ms
Lead Channel Sensing Intrinsic Amplitude: 1.375 mV
Lead Channel Sensing Intrinsic Amplitude: 1.375 mV
Lead Channel Sensing Intrinsic Amplitude: 9.625 mV
Lead Channel Sensing Intrinsic Amplitude: 9.625 mV
Lead Channel Setting Pacing Amplitude: 0.5 V
Lead Channel Setting Pacing Amplitude: 1.5 V
Lead Channel Setting Pacing Amplitude: 2.75 V
Lead Channel Setting Pacing Pulse Width: 0.03 ms
Lead Channel Setting Pacing Pulse Width: 0.6 ms
Lead Channel Setting Sensing Sensitivity: 0.3 mV

## 2019-12-30 NOTE — Telephone Encounter (Signed)
I called [name] [name] to schedule an appointment for tomorrow. He stated he would be available to meet anytime so we agreed to meet at 13:00.   Jacqualine Code, EMT  12/30/19

## 2019-12-31 ENCOUNTER — Other Ambulatory Visit (HOSPITAL_COMMUNITY): Payer: Self-pay

## 2019-12-31 NOTE — Progress Notes (Signed)
Paramedicine Encounter    Patient ID: [name] [name], male    DOB: 1961/07/09, 59 y.o.   MRN: [id number]   Patient Care Team: [name], MD as PCP - General (Family Medicine) [name], [name], MD as PCP - Advanced Heart Failure (Cardiology) [name], MD as PCP - Electrophysiology (Cardiology) [name], MD as PCP - Cardiology (Cardiology) [name], MD (Family Medicine) [name], LCSW as Social Worker (Licensed Clinical Social Worker)  Patient Active Problem List   Diagnosis Date Noted  . Hypokalemia 11/23/2019  . Ventricular tachycardia (HCC) 11/19/2019  . Primary osteoarthritis of right hip 11/03/2018  . VT (ventricular tachycardia) (HCC) 05/30/2017  . AKI (acute kidney injury) (HCC)   . Acute on chronic systolic CHF (congestive heart failure) (HCC) 01/29/2017  . HTN (hypertension) 12/25/2016  . Obesity 12/25/2016  . OSA (obstructive sleep apnea) 12/25/2016  . Hyperglycemia 12/25/2016  . Acute respiratory failure with hypoxia (HCC) 12/25/2016  . Chronic anticoagulation 06/27/2016  . Encounter for therapeutic drug monitoring 04/19/2015  . S/P ICD (internal cardiac defibrillator) procedure Medtronic CRT ICD 04/15/2015  . Atrial fibrillation with RVR - Paroxysmal 04/08/2015  . NICM 04/06/2015  . Troponin level elevated 04/06/2015  . Rectal bleeding 10/26/2011  . Hydrocele, left 05/24/2011  . Tobacco use disorder- 1/2 pk a day   . Alcohol abuse   . Obesity 09/27/2008  . CAD- minor CAD 2009 and 2016 09/27/2008  . VENTRICULAR TACHYCARDIA 09/27/2008  . Essential hypertension 06/19/2008  . Chronic systolic heart failure (HCC) 06/19/2008    Current Outpatient Medications:  .  acetaminophen (TYLENOL) 325 MG tablet, Take 2 tablets (650 mg total) by mouth every 4 (four) hours as needed for headache or mild pain., Disp: , Rfl:  .  albuterol (VENTOLIN HFA) 108 (90 Base) MCG/ACT inhaler, Inhale 1 puff into the lungs every 6 (six) hours as  needed for wheezing or shortness of breath., Disp: 18 g, Rfl: 3 .  amiodarone (PACERONE) 200 MG tablet, Take 1 tablet (200 mg total) by mouth daily., Disp: 90 tablet, Rfl: 3 .  atorvastatin (LIPITOR) 80 MG tablet, TAKE 1 TABLET BY MOUTH ONCE DAILY AT  6  PM, Disp: 30 tablet, Rfl: 6 .  mirtazapine (REMERON) 30 MG tablet, 15 mg at bedtime., Disp: , Rfl:  .  potassium chloride SA (KLOR-CON) 20 MEQ tablet, Take 3 tablets (60 mEq total) by mouth daily., Disp: 360 tablet, Rfl: 3 .  rivaroxaban (XARELTO) 20 MG TABS tablet, Take 1 tablet (20 mg total) by mouth daily with supper., Disp: 30 tablet, Rfl: 6 .  sacubitril-valsartan (ENTRESTO) 24-26 MG, Take 1 tablet by mouth 2 (two) times daily., Disp: 60 tablet, Rfl: 3 .  sotalol (BETAPACE) 80 MG tablet, Take 1 tablet (80 mg total) by mouth every 12 (twelve) hours., Disp: 60 tablet, Rfl: 6 .  spironolactone (ALDACTONE) 25 MG tablet, TAKE 1 TABLET BY MOUTH ONCE DAILY (NEED  OFFICE  VISIT  FOR  FUTURE  REFILLS), Disp: 30 tablet, Rfl: 6 .  torsemide (DEMADEX) 20 MG tablet, Take 3 tablets (60 mg total) by mouth daily., Disp: 540 tablet, Rfl: 3 .  magnesium oxide (MAG-OX) 400 (241.3 Mg) MG tablet, Take 1 tablet (400 mg total) by mouth daily. (Patient not taking: Reported on 12/24/2019), Disp: 90 tablet, Rfl: 3 .  tiZANidine (ZANAFLEX) 2 MG tablet, Take 1 tablet (2 mg total) by mouth every 8 (eight) hours as needed for muscle spasms. (Patient not taking: Reported on 11/19/2019), Disp:  40 tablet, Rfl: 0 No Known Allergies    Social History   Socioeconomic History  . Marital status: Divorced    Spouse name: Not on file  . Number of children: 2  . Years of education: Not on file  . Highest education level: Not on file  Occupational History  . Occupation: Air traffic controller  . Occupation: disabled, odd jobs  Tobacco Use  . Smoking status: Former Smoker    Packs/day: 0.50    Years: 4.00    Pack years: 2.00    Types: Cigarettes    Quit date: 10/29/2005    Years since  quitting: 14.1  . Smokeless tobacco: Never Used  . Tobacco comment: about a pack a week. social  Substance and Sexual Activity  . Alcohol use: Yes    Alcohol/week: 42.0 standard drinks    Types: 42 Cans of beer per week    Comment: 24 a week  . Drug use: Yes    Frequency: 7.0 times per week    Types: Marijuana    Comment: has discontinued  . Sexual activity: Yes    Partners: Female  Other Topics Concern  . Not on file  Social History Narrative   ** Merged History Encounter **       Social Determinants of Health   Financial Resource Strain:   . Difficulty of Paying Living Expenses:   Food Insecurity:   . Worried About Charity fundraiser in the Last Year:   . Arboriculturist in the Last Year:   Transportation Needs: No Transportation Needs  . Lack of Transportation (Medical): No  . Lack of Transportation (Non-Medical): No  Physical Activity: Sufficiently Active  . Days of Exercise per Week: 7 days  . Minutes of Exercise per Session: 30 min  Stress:   . Feeling of Stress :   Social Connections:   . Frequency of Communication with Friends and Family:   . Frequency of Social Gatherings with Friends and Family:   . Attends Religious Services:   . Active Member of Clubs or Organizations:   . Attends Archivist Meetings:   Marland Kitchen Marital Status:   Intimate Partner Violence:   . Fear of Current or Ex-Partner:   . Emotionally Abused:   Marland Kitchen Physically Abused:   . Sexually Abused:     Physical Exam Cardiovascular:     Rate and Rhythm: Normal rate and regular rhythm.     Pulses: Normal pulses.  Pulmonary:     Effort: Pulmonary effort is normal.     Breath sounds: Normal breath sounds.  Musculoskeletal:        General: Normal range of motion.     Right lower leg: No edema.     Left lower leg: No edema.  Skin:    General: Skin is warm and dry.     Capillary Refill: Capillary refill takes less than 2 seconds.  Neurological:     Mental Status: He is alert and  oriented to person, place, and time.  Psychiatric:        Mood and Affect: Mood normal.         Future Appointments  Date Time Provider [place]  01/14/2020  1:50 PM Nicolette Bang, DO [facility] None  01/16/2020 11:00 AM Larey Dresser, MD [facility] None  03/30/2020  7:20 AM [facility] DEVICE REMOTES [facility]  06/29/2020  7:20 AM [facility] DEVICE REMOTES [facility]    BP 100/60 (BP Location: Left Arm, Patient  Position: Sitting, Cuff Size: Normal)   Pulse 61   Wt (!) 317 lb (143.8 kg)   SpO2 95%   BMI 38.59 kg/m   Weight yesterday- 318 lb Last visit weight- 318 lb  Mr Eplin was seen at home today and reported feeling well. He denied chest pain, SOB, headache, dizziness, orthopnea, fever or cough since our last visit. He stated he has been compliant with his medications over the past week and his weight has been stable. His medications were verified and his pillbox was refilled. I will follow up next week.   Jacqualine Code, EMT 12/31/19  ACTION: Home visit completed Next visit planned for 1 week

## 2019-12-31 NOTE — Progress Notes (Signed)
ICD Remote  

## 2020-01-02 ENCOUNTER — Ambulatory Visit (INDEPENDENT_AMBULATORY_CARE_PROVIDER_SITE_OTHER): Payer: Medicare Other

## 2020-01-02 DIAGNOSIS — Z95810 Presence of automatic (implantable) cardiac defibrillator: Secondary | ICD-10-CM

## 2020-01-02 DIAGNOSIS — I5022 Chronic systolic (congestive) heart failure: Secondary | ICD-10-CM

## 2020-01-02 NOTE — Progress Notes (Signed)
EPIC Encounter for ICM Monitoring  Patient Name: [name] [name] is a 59 y.o. male Date: 01/02/2020 Primary Care Physican: [name], MD Primary Cardiologist:[name] Electrophysiologist:[name] Bi-V Pacing:75 % (was 95.3% on 11/19/19) 3/15/2021Weight: 311lbs   Attempted call to patient and unable to reach.  Left detailed message per DPR regarding transmission. Transmission reviewed.   OptivolThoracic impedancenormal. Message sent to device clinic to review decrease in BiV Pacing  Prescribed:Torsemide20 mg 3 tablets (60 mg total) twice a day. Potassium 2 tablets (40 mEq total) twice a day.  Labs: 09/29/2019 Creatinine 1.10, BUN 13, Potassium 4.0, Sodium 140, GFR 75-86 09/23/2019 Creatinine1.02, BUN11, Potassium4.0, Sodium140, GFR>60 09/16/2019 Creatinine1.01, BUN11, Potassium4.1, Sodium140, GFR>60  08/22/2019 Creatinine1.19, BUN14, Potassium4.0, Sodium141, GFR>60  A complete set of results can be found in Results Review.  Recommendations:Left voice mail with ICM number and encouraged to call if experiencing any fluid symptoms.  Follow-up plan: ICM clinic phone appointment on5/17/2021. 91 day device clinic remote transmission7/13/2021. Office visit with Dr Shirlee Latch on 01/16/2020.  Copy of ICM check sent to Dr.[name]  3 month ICM trend: 01/01/2020    1 Year ICM trend:       [name], RN 01/02/2020 3:38 PM

## 2020-01-02 NOTE — Progress Notes (Signed)
Device clinic BiV pacing review.  Nigel Sloop, RN  Safal Halderman, Josephine Igo, RN  It looks like his device was reprogrammed during his March hospitalization.   Thanks,  Irving Burton

## 2020-01-08 ENCOUNTER — Other Ambulatory Visit (HOSPITAL_COMMUNITY): Payer: Self-pay

## 2020-01-08 NOTE — Progress Notes (Signed)
Paramedicine Encounter    Patient ID: [name], male    DOB: 1961/07/09, 59 y.o.   MRN: [id number]   Patient Care Team: [name], MD as PCP - General (Family Medicine) [name], [name], MD as PCP - Advanced Heart Failure (Cardiology) [name], MD as PCP - Electrophysiology (Cardiology) [name], MD as PCP - Cardiology (Cardiology) [name], MD (Family Medicine) [name], LCSW as Social Worker (Licensed Clinical Social Worker)  Patient Active Problem List   Diagnosis Date Noted  . Hypokalemia 11/23/2019  . Ventricular tachycardia (HCC) 11/19/2019  . Primary osteoarthritis of right hip 11/03/2018  . VT (ventricular tachycardia) (HCC) 05/30/2017  . AKI (acute kidney injury) (HCC)   . Acute on chronic systolic CHF (congestive heart failure) (HCC) 01/29/2017  . HTN (hypertension) 12/25/2016  . Obesity 12/25/2016  . OSA (obstructive sleep apnea) 12/25/2016  . Hyperglycemia 12/25/2016  . Acute respiratory failure with hypoxia (HCC) 12/25/2016  . Chronic anticoagulation 06/27/2016  . Encounter for therapeutic drug monitoring 04/19/2015  . S/P ICD (internal cardiac defibrillator) procedure Medtronic CRT ICD 04/15/2015  . Atrial fibrillation with RVR - Paroxysmal 04/08/2015  . NICM 04/06/2015  . Troponin level elevated 04/06/2015  . Rectal bleeding 10/26/2011  . Hydrocele, left 05/24/2011  . Tobacco use disorder- 1/2 pk a day   . Alcohol abuse   . Obesity 09/27/2008  . CAD- minor CAD 2009 and 2016 09/27/2008  . VENTRICULAR TACHYCARDIA 09/27/2008  . Essential hypertension 06/19/2008  . Chronic systolic heart failure (HCC) 06/19/2008    Current Outpatient Medications:  .  acetaminophen (TYLENOL) 325 MG tablet, Take 2 tablets (650 mg total) by mouth every 4 (four) hours as needed for headache or mild pain., Disp: , Rfl:  .  albuterol (VENTOLIN HFA) 108 (90 Base) MCG/ACT inhaler, Inhale 1 puff into the lungs every 6 (six) hours as  needed for wheezing or shortness of breath., Disp: 18 g, Rfl: 3 .  amiodarone (PACERONE) 200 MG tablet, Take 1 tablet (200 mg total) by mouth daily., Disp: 90 tablet, Rfl: 3 .  atorvastatin (LIPITOR) 80 MG tablet, TAKE 1 TABLET BY MOUTH ONCE DAILY AT  6  PM, Disp: 30 tablet, Rfl: 6 .  mirtazapine (REMERON) 30 MG tablet, 15 mg at bedtime., Disp: , Rfl:  .  potassium chloride SA (KLOR-CON) 20 MEQ tablet, Take 3 tablets (60 mEq total) by mouth daily., Disp: 360 tablet, Rfl: 3 .  rivaroxaban (XARELTO) 20 MG TABS tablet, Take 1 tablet (20 mg total) by mouth daily with supper., Disp: 30 tablet, Rfl: 6 .  sacubitril-valsartan (ENTRESTO) 24-26 MG, Take 1 tablet by mouth 2 (two) times daily., Disp: 60 tablet, Rfl: 3 .  sotalol (BETAPACE) 80 MG tablet, Take 1 tablet (80 mg total) by mouth every 12 (twelve) hours., Disp: 60 tablet, Rfl: 6 .  spironolactone (ALDACTONE) 25 MG tablet, TAKE 1 TABLET BY MOUTH ONCE DAILY (NEED  OFFICE  VISIT  FOR  FUTURE  REFILLS), Disp: 30 tablet, Rfl: 6 .  torsemide (DEMADEX) 20 MG tablet, Take 3 tablets (60 mg total) by mouth daily., Disp: 540 tablet, Rfl: 3 .  magnesium oxide (MAG-OX) 400 (241.3 Mg) MG tablet, Take 1 tablet (400 mg total) by mouth daily. (Patient not taking: Reported on 12/24/2019), Disp: 90 tablet, Rfl: 3 .  tiZANidine (ZANAFLEX) 2 MG tablet, Take 1 tablet (2 mg total) by mouth every 8 (eight) hours as needed for muscle spasms. (Patient not taking: Reported on 11/19/2019), Disp:  40 tablet, Rfl: 0 No Known Allergies    Social History   Socioeconomic History  . Marital status: Divorced    Spouse name: Not on file  . Number of children: 2  . Years of education: Not on file  . Highest education level: Not on file  Occupational History  . Occupation: Air traffic controller  . Occupation: disabled, odd jobs  Tobacco Use  . Smoking status: Former Smoker    Packs/day: 0.50    Years: 4.00    Pack years: 2.00    Types: Cigarettes    Quit date: 10/29/2005    Years since  quitting: 14.2  . Smokeless tobacco: Never Used  . Tobacco comment: about a pack a week. social  Substance and Sexual Activity  . Alcohol use: Yes    Alcohol/week: 42.0 standard drinks    Types: 42 Cans of beer per week    Comment: 24 a week  . Drug use: Yes    Frequency: 7.0 times per week    Types: Marijuana    Comment: has discontinued  . Sexual activity: Yes    Partners: Female  Other Topics Concern  . Not on file  Social History Narrative   ** Merged History Encounter **       Social Determinants of Health   Financial Resource Strain:   . Difficulty of Paying Living Expenses:   Food Insecurity:   . Worried About Charity fundraiser in the Last Year:   . Arboriculturist in the Last Year:   Transportation Needs: No Transportation Needs  . Lack of Transportation (Medical): No  . Lack of Transportation (Non-Medical): No  Physical Activity: Sufficiently Active  . Days of Exercise per Week: 7 days  . Minutes of Exercise per Session: 30 min  Stress:   . Feeling of Stress :   Social Connections:   . Frequency of Communication with Friends and Family:   . Frequency of Social Gatherings with Friends and Family:   . Attends Religious Services:   . Active Member of Clubs or Organizations:   . Attends Archivist Meetings:   Marland Kitchen Marital Status:   Intimate Partner Violence:   . Fear of Current or Ex-Partner:   . Emotionally Abused:   Marland Kitchen Physically Abused:   . Sexually Abused:     Physical Exam Cardiovascular:     Rate and Rhythm: Normal rate and regular rhythm.     Pulses: Normal pulses.  Pulmonary:     Effort: Pulmonary effort is normal.     Breath sounds: Normal breath sounds.  Musculoskeletal:        General: Normal range of motion.     Right lower leg: No edema.     Left lower leg: No edema.  Skin:    General: Skin is warm and dry.     Capillary Refill: Capillary refill takes less than 2 seconds.  Neurological:     Mental Status: He is alert and  oriented to person, place, and time.  Psychiatric:        Mood and Affect: Mood normal.         Future Appointments  Date Time Provider [place]  01/14/2020  1:50 PM Nicolette Bang, DO [facility] None  01/16/2020 11:00 AM Larey Dresser, MD [facility] None  02/02/2020 12:10 PM [facility] DEVICE REMOTES [facility]  03/30/2020  7:20 AM [facility] DEVICE REMOTES [facility]  06/29/2020  7:20 AM [facility] DEVICE REMOTES [facility]  BP 104/70 (BP Location: Left Arm, Patient Position: Sitting, Cuff Size: Large)   Pulse 60   Resp 18   Wt (!) 319 lb (144.7 kg)   SpO2 92%   BMI 38.83 kg/m   Weight yesterday- 318 lb Last visit weight- 317 lb  Mr Dragovich was seen at home today and reported feeling well. He denied chest pain, SOB, headache, dizziness, orthopnea, fever or cough since our last visit. He stated he has been compliant with his medications over the past week and his weight has been stable. His medications were verified and his pillbox was refilled. I will follow up next week.   Jacqualine Code, EMT 01/08/20  ACTION: Home visit completed Next visit planned for 1 week

## 2020-01-09 ENCOUNTER — Telehealth: Payer: Self-pay | Admitting: *Deleted

## 2020-01-09 NOTE — Telephone Encounter (Signed)
Patient with diagnosis of afib on Xarelto for anticoagulation.    Procedure: RIGHT TOTAL KNEE ARTHROPLASTY  Date of procedure: TBD  CHADS2-VASc score of  3 (CHF, HTN, CAD)  CrCl 120 ml/min  Per office protocol, patient can hold Xarelto for 3 days prior to procedure.

## 2020-01-09 NOTE — Telephone Encounter (Signed)
   [facility] Pre-operative Risk Assessment    Request for surgical clearance:  1. What type of surgery is being performed? RIGHT TOTAL KNEE ARTHROPLASTY   2. When is this surgery scheduled? TBD   3. What type of clearance is required (medical clearance vs. Pharmacy clearance to hold med vs. Both)? BOTH  4. Are there any medications that need to be held prior to surgery and how long? [place]   5. Practice name and name of physician performing surgery? [facility]; DR. Jenny Reichmann [place]   6. What is your office phone number [phone]    7.   What is your office fax number [phone]  8.   Anesthesia type (None, local, MAC, general) ? SPINAL   Julaine Hua 01/09/2020, 10:04 AM  _________________________________________________________________   (provider comments below)

## 2020-01-09 NOTE — Telephone Encounter (Signed)
   Primary Cardiologist: Marca Ancona, MD  Chart reviewed as part of pre-operative protocol coverage. Given past medical history and time since last visit, based on ACC/AHA guidelines, Kinnick Kass would be at acceptable risk for the planned procedure without further cardiovascular testing.   Patient with diagnosis of afib on Xarelto for anticoagulation.    Procedure: RIGHT TOTAL KNEE ARTHROPLASTY Date of procedure: TBD  CHADS2-VASc score of  3 (CHF, HTN, CAD)  CrCl 120 ml/min  Per office protocol, patient can hold Xarelto for 3 days prior to procedure.    I will route this recommendation to the requesting party via Epic fax function and remove from pre-op pool.  Please call with questions.  Thomasene Ripple. Frenchie Dangerfield NP-C    01/09/2020, 11:51 AM [facility] [address] Office [phone] Fax [phone]

## 2020-01-12 IMAGING — RF DG HIP (WITH PELVIS) OPERATIVE*R*
1 series · 2 of 2 positions shown · non-contrast
Comparison: None.

CLINICAL DATA: 57-year-old male post right hip replacement. Initial
encounter.

EXAM:
OPERATIVE right HIP (WITH PELVIS IF PERFORMED) 2 VIEWS
TECHNIQUE: Fluoroscopic spot image(s) were submitted for interpretation
post-operatively.
Fluoroscopic time: 29 seconds.

[Series 1: unknown protocol · 0.20mm/px · 2 of 2 slices shown]
[im 1/2]
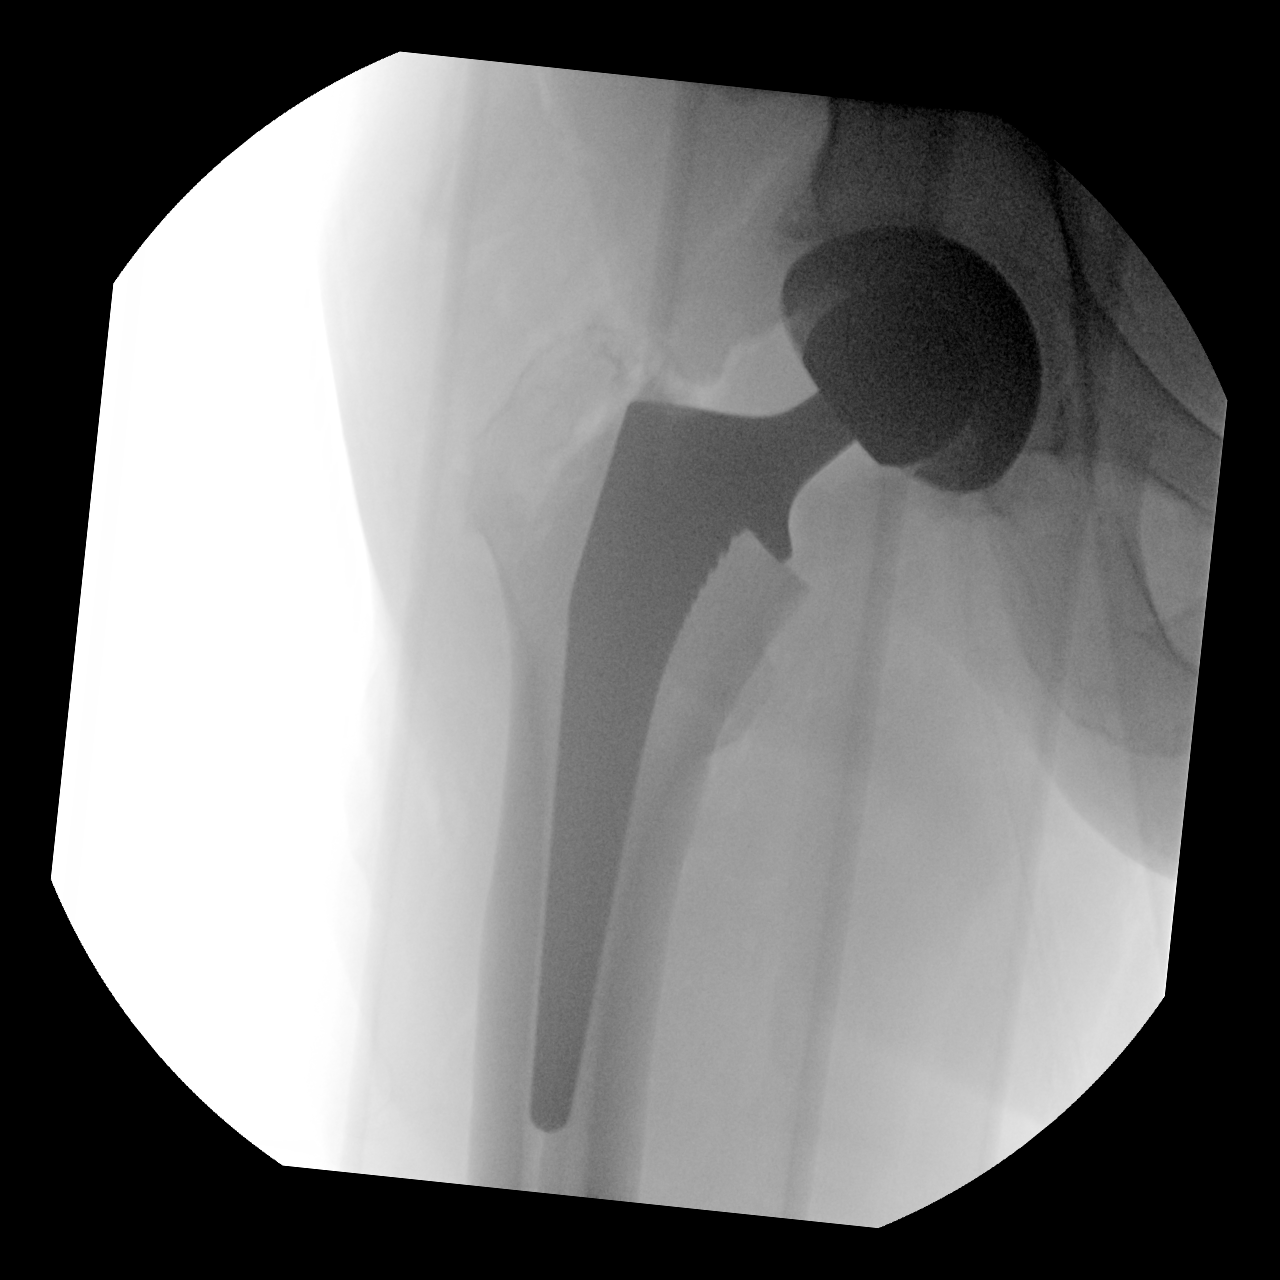
[im 2/2]
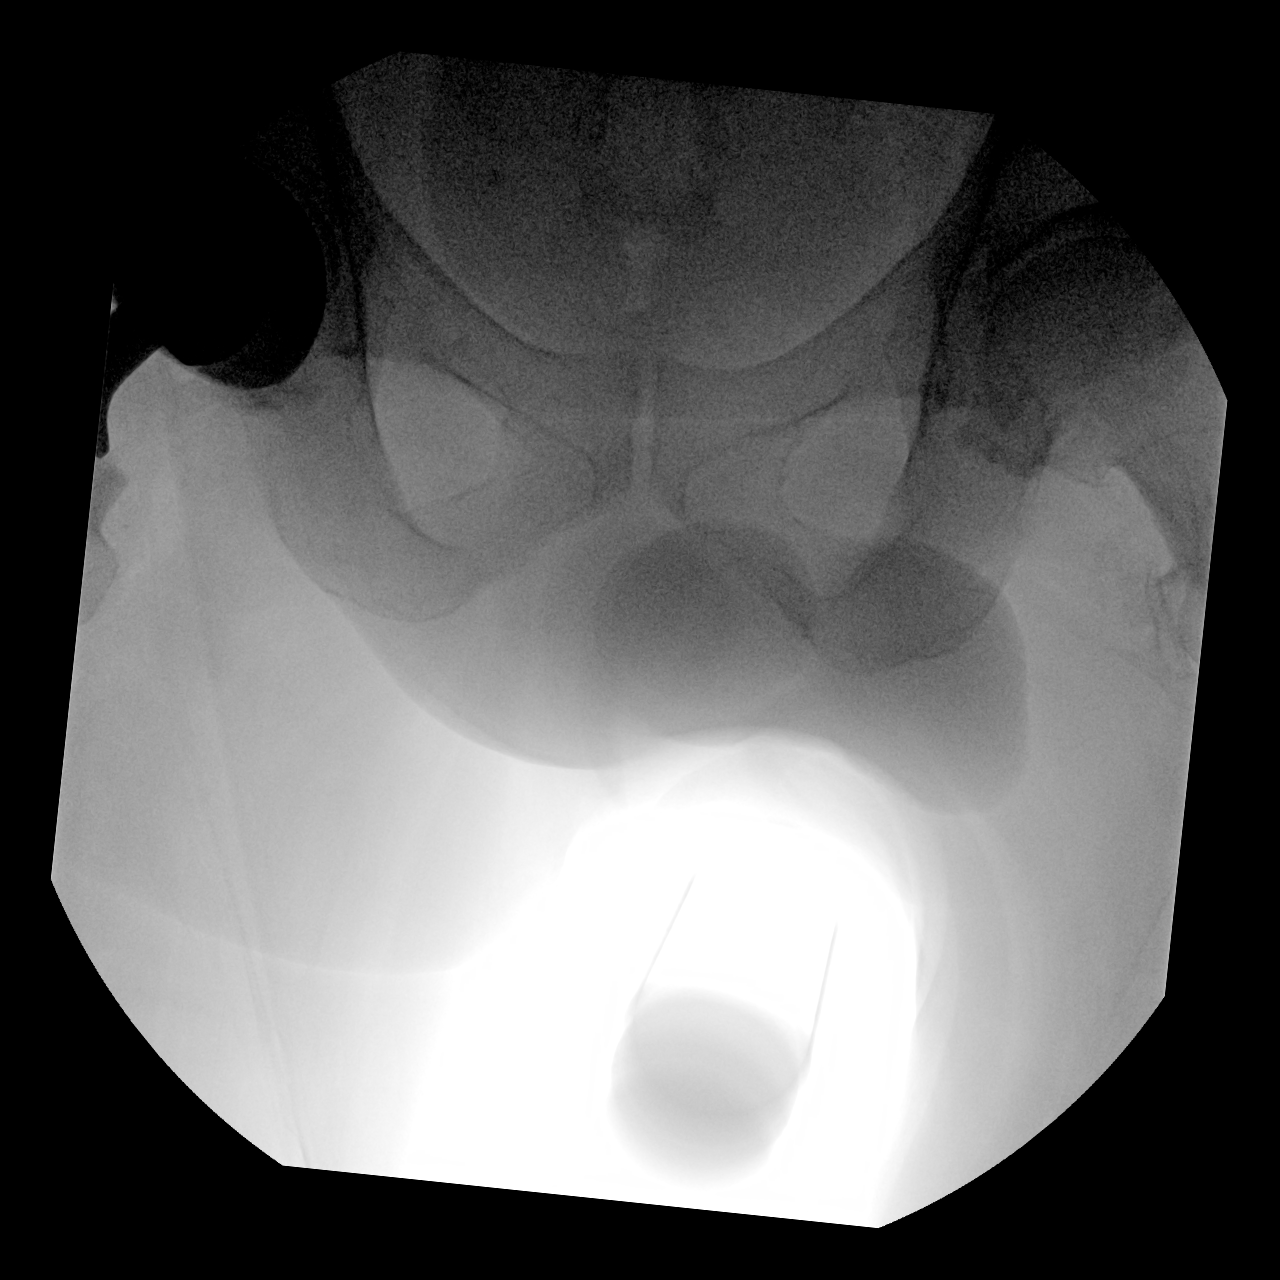

[2 of 2 positions shown; findings below may reference images not displayed]

FINDINGS: Two intraoperative C-arm views of the right hip submitted for review
after surgery. This reveals patient is post total right hip
replacement which appears in satisfactory position without
complication noted on frontal imaging. Overlying soft tissue gas
slightly limits evaluation.
IMPRESSION: Post total right hip replacement.

## 2020-01-13 ENCOUNTER — Other Ambulatory Visit: Payer: Self-pay | Admitting: Orthopedic Surgery

## 2020-01-14 ENCOUNTER — Encounter (HOSPITAL_COMMUNITY): Payer: Self-pay

## 2020-01-14 ENCOUNTER — Encounter: Payer: Self-pay | Admitting: Internal Medicine

## 2020-01-14 ENCOUNTER — Telehealth (HOSPITAL_COMMUNITY): Payer: Self-pay

## 2020-01-14 ENCOUNTER — Telehealth (INDEPENDENT_AMBULATORY_CARE_PROVIDER_SITE_OTHER): Payer: Medicare Other | Admitting: Internal Medicine

## 2020-01-14 DIAGNOSIS — Z7901 Long term (current) use of anticoagulants: Secondary | ICD-10-CM

## 2020-01-14 DIAGNOSIS — R21 Rash and other nonspecific skin eruption: Secondary | ICD-10-CM

## 2020-01-14 DIAGNOSIS — I4891 Unspecified atrial fibrillation: Secondary | ICD-10-CM | POA: Diagnosis not present

## 2020-01-14 DIAGNOSIS — I5022 Chronic systolic (congestive) heart failure: Secondary | ICD-10-CM

## 2020-01-14 DIAGNOSIS — Z7689 Persons encountering health services in other specified circumstances: Secondary | ICD-10-CM | POA: Diagnosis not present

## 2020-01-14 DIAGNOSIS — I10 Essential (primary) hypertension: Secondary | ICD-10-CM | POA: Diagnosis not present

## 2020-01-14 MED ORDER — TRIAMCINOLONE ACETONIDE 0.1 % EX CREA: 1.0000 "application " | TOPICAL_CREAM | Freq: Two times a day (BID) | CUTANEOUS | 1 refills | Status: DC

## 2020-01-14 NOTE — Progress Notes (Signed)
Virtual Visit via Telephone Note  I connected with [name] [name], on 01/14/2020 at 1:59 PM by telephone due to the COVID-19 pandemic and verified that I am speaking with the correct person using two identifiers.   Consent: I discussed the limitations, risks, security and privacy concerns of performing an evaluation and management service by telephone and the availability of in person appointments. I also discussed with the patient that there may be a patient responsible charge related to this service. The patient expressed understanding and agreed to proceed.   Location of Patient: Home   Location of Provider: Clinic    Persons participating in Telemedicine visit: Zamauri Oplinger Montel Clock [facility] Dr. Earlene Plater      History of Present Illness: Patient has a visit to establish care. Patient needs a new primary care provider due to physician retiring. PMH significant for HTN, CAD, chronic systolic HF, Afib. He is followed by the heart failure clinic.   He is followed by paramedic HF once per week at home. He is monitoring his weight at home--no significant changes from baseline. Denies LE edema. Does not feel like he has any volume overload.   Patient is on Xarelto for Afib. Patient has not had any sensation of tachycardia or palpitations.   Patient has a concern about a rash present on his knees, ankles, and back of his legs. It is itchy in nature and seems dry. Sometimes turns red. This has been present for >1 year and doesn't seem to every fully go away. No history of allergies or asthma. 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     Past Medical History:  Diagnosis Date  . AICD (automatic cardioverter/defibrillator) present   . Arthritis   . CAD (coronary artery disease)    Mild nonobstructive (Cath 09)  . Chronic systolic heart failure (HCC)    Nonischemic CM: echo 4/12 with EF 25-30%, grade 2 diast dysfxn, mild dilated aortic root 43 mm, trivial MR,  mod LAE  . COPD (chronic obstructive pulmonary disease) (HCC)   . Dyspnea   . H/O ETOH abuse   . HTN (hypertension)   . Hypersomnia   . Obesity, morbid (HCC)   . OSA on CPAP   . Systolic and diastolic CHF, acute on chronic (HCC) 11/2016  . Tobacco use disorder   . Ventricular tachycardia (HCC)    No Known Allergies  Current Outpatient Medications on File Prior to Visit  Medication Sig Dispense Refill  . albuterol (VENTOLIN HFA) 108 (90 Base) MCG/ACT inhaler Inhale 1 puff into the lungs every 6 (six) hours as needed for wheezing or shortness of breath. 18 g 3  . amiodarone (PACERONE) 200 MG tablet Take 1 tablet (200 mg total) by mouth daily. 90 tablet 3  . atorvastatin (LIPITOR) 80 MG tablet TAKE 1 TABLET BY MOUTH ONCE DAILY AT  6  PM 30 tablet 6  . mirtazapine (REMERON) 30 MG tablet 15 mg at bedtime.    . potassium chloride SA (KLOR-CON) 20 MEQ tablet Take 3 tablets (60 mEq total) by mouth daily. 360 tablet 3  . rivaroxaban (XARELTO) 20 MG TABS tablet Take 1 tablet (20 mg total) by mouth daily with supper. 30 tablet 6  . sacubitril-valsartan (ENTRESTO) 24-26 MG Take 1 tablet by mouth 2 (two) times daily. 60 tablet 3  . sotalol (BETAPACE) 80 MG tablet Take 1 tablet (80 mg total) by mouth every 12 (twelve) hours. 60 tablet 6  . spironolactone (ALDACTONE) 25 MG  tablet TAKE 1 TABLET BY MOUTH ONCE DAILY (NEED  OFFICE  VISIT  FOR  FUTURE  REFILLS) 30 tablet 6  . torsemide (DEMADEX) 20 MG tablet Take 3 tablets (60 mg total) by mouth daily. 540 tablet 3   No current facility-administered medications on file prior to visit.    Observations/Objective: NAD. Speaking clearly.  Work of breathing normal.  Alert and oriented. Mood appropriate.   Assessment and Plan: 1. Encounter to establish care  2. Atrial fibrillation with RVR - Paroxysmal Stable. Patient is on Amiodarone, beta blocker, and anticoagulation. Has pacemaker in place.   3. Chronic anticoagulation No abnormal bleeding.  Continue Xarelto for stroke prevention related to Afib.   4. Essential hypertension Patient reports he has been at goal.   5. Chronic systolic heart failure ([place]) Echo 12/20 shows EF 20-25%. Patient is on Torsemide, Entrestro, Spironolactone, and Sotalol. No signs of volume overload per patient. Followed closely by cardiology and HF clinic.   6. Rash and nonspecific skin eruption Sounds most consistent with eczema given prolonged rash that itches, is dry, and sometimes red. However, certainly could be related to repeat exposure to contact irritants as well. Will treat with steroid cream and discussed appropriate use. If does not resolve/improve with steroids will need f/u.  - triamcinolone cream (KENALOG) 0.1 %; Apply 1 application topically 2 (two) times daily.  Dispense: 80 g; Refill: 1    Follow Up Instructions: Annual exam and PRN    I discussed the assessment and treatment plan with the patient. The patient was provided an opportunity to ask questions and all were answered. The patient agreed with the plan and demonstrated an understanding of the instructions.   The patient was advised to call back or seek an in-person evaluation if the symptoms worsen or if the condition fails to improve as anticipated.     I provided 22 minutes total of non-face-to-face time during this encounter including median intraservice time, reviewing previous notes, investigations, ordering medications, medical decision making, coordinating care and patient verbalized understanding at the end of the visit.    Phill Myron, D.O. Primary Care at [facility]  01/14/2020, 1:59 PM

## 2020-01-14 NOTE — Patient Instructions (Addendum)
DUE TO COVID-19 ONLY ONE VISITOR ARE ALLOWED TO COME WITH YOU AND STAY IN THE WAITING ROOM ONLY DURING PRE OP AND PROCEDURE. THEN TWO VISITORS MAY VISIT WITH YOU IN YOUR PRIVATE ROOM DURING VISITING HOURS ONLY!!   COVID SWAB TESTING MUST BE COMPLETED ON: Tuesday, Jan 27, 2020 at  11:30AM   [address], [place] [facility] enter pre surgical testing line (Must self quarantine after testing. Follow instructions on handout.)             Your procedure is scheduled on: Friday, Jan 30, 2020   Report to [facility] Main  Entrance   Report to Short Stay at 5:30 AM   [facility])   Call this number if you have problems the morning of surgery [phone]   Bring CPAP mask and tubing day of surgery   Do not eat food:After Midnight.   May have liquids until 4:15 AM day of surgery   CLEAR LIQUID DIET  Foods Allowed                                                                     Foods Excluded  Water, Black Coffee and tea, regular and decaf                             liquids that you cannot  Plain Jell-O in any flavor  (No red)                                           see through such as: Fruit ices (not with fruit pulp)                                     milk, soups, orange juice  Iced Popsicles (No red)                                    All solid food Carbonated beverages, regular and diet                                    Apple juices Sports drinks like Gatorade (No red) Lightly seasoned clear broth or consume(fat free) Sugar, honey syrup  Sample Menu Breakfast                                Lunch                                     Supper Cranberry juice                    Beef broth  Chicken broth Jell-O                                     Grape juice                           Apple juice Coffee or tea                        Jell-O                                      Popsicle                                                 Coffee or tea                        Coffee or tea   Complete one Ensure drink the morning of surgery at 4:15 AM the day of surgery.   Oral Hygiene is also important to reduce your risk of infection.                                    Remember - BRUSH YOUR TEETH THE MORNING OF SURGERY WITH YOUR REGULAR TOOTHPASTE   Do NOT smoke after Midnight   Take these medicines the morning of surgery with A SIP OF WATER: Sotalol   Bring Inhaler day of surgery                               You may not have any metal on your body including hair pins, jewelry, and body piercings             Do not wear make-up, lotions, powders, perfumes/cologne, or deodorant             Do not wear nail polish.  Do not shave  48 hours prior to surgery.              Men may shave face and neck.   Do not bring valuables to the hospital. [place].   Contacts, dentures or bridgework may not be worn into surgery.    Patients discharged the day of surgery will not be allowed to drive home.   Special Instructions: Bring a copy of your healthcare power of attorney and living will documents         the day of surgery if you haven't scanned them in before.              Please read over the following fact sheets you were given: IF YOU HAVE QUESTIONS ABOUT YOUR PRE OP INSTRUCTIONS PLEASE CALL [phone]   [facility] - Preparing for Surgery Before surgery, you can play an important role.  Because skin is not sterile, your skin needs to be as free of germs as possible.  You can reduce the number of  germs on your skin by washing with CHG (chlorahexidine gluconate) soap before surgery.  CHG is an antiseptic cleaner which kills germs and bonds with the skin to continue killing germs even after washing. Please DO NOT use if you have an allergy to CHG or antibacterial soaps.  If your skin becomes reddened/irritated stop using the CHG and inform your nurse when you arrive  at Short Stay. Do not shave (including legs and underarms) for at least 48 hours prior to the first CHG shower.  You may shave your face/neck.  Please follow these instructions carefully:  1.  Shower with CHG Soap the night before surgery and the  morning of surgery.  2.  If you choose to wash your hair, wash your hair first as usual with your normal  shampoo.  3.  After you shampoo, rinse your hair and body thoroughly to remove the shampoo.                             4.  Use CHG as you would any other liquid soap.  You can apply chg directly to the skin and wash.  Gently with a scrungie or clean washcloth.  5.  Apply the CHG Soap to your body ONLY FROM THE NECK DOWN.   Do   not use on face/ open                           Wound or open sores. Avoid contact with eyes, ears mouth and   genitals (private parts).                       Wash face,  Genitals (private parts) with your normal soap.             6.  Wash thoroughly, paying special attention to the area where your    surgery  will be performed.  7.  Thoroughly rinse your body with warm water from the neck down.  8.  DO NOT shower/wash with your normal soap after using and rinsing off the CHG Soap.                9.  Pat yourself dry with a clean towel.            10.  Wear clean pajamas.            11.  Place clean sheets on your bed the night of your first shower and do not  sleep with pets. Day of Surgery : Do not apply any lotions/deodorants the morning of surgery.  Please wear clean clothes to the hospital/surgery center.  FAILURE TO FOLLOW THESE INSTRUCTIONS MAY RESULT IN THE CANCELLATION OF YOUR SURGERY  PATIENT SIGNATURE_________________________________  NURSE SIGNATURE__________________________________  ________________________________________________________________________   Rogelia Mire  An incentive spirometer is a tool that can help keep your lungs clear and active. This tool measures how well you are filling  your lungs with each breath. Taking long deep breaths may help reverse or decrease the chance of developing breathing (pulmonary) problems (especially infection) following:  A long period of time when you are unable to move or be active. BEFORE THE PROCEDURE   If the spirometer includes an indicator to show your best effort, your nurse or respiratory therapist will set it to a desired goal.  If possible, sit up straight or lean slightly forward. Try not to slouch.  Hold the incentive spirometer in an upright position. INSTRUCTIONS FOR USE  1. Sit on the edge of your bed if possible, or sit up as far as you can in bed or on a chair. 2. Hold the incentive spirometer in an upright position. 3. Breathe out normally. 4. Place the mouthpiece in your mouth and seal your lips tightly around it. 5. Breathe in slowly and as deeply as possible, raising the piston or the ball toward the top of the column. 6. Hold your breath for 3-5 seconds or for as long as possible. Allow the piston or ball to fall to the bottom of the column. 7. Remove the mouthpiece from your mouth and breathe out normally. 8. Rest for a few seconds and repeat Steps 1 through 7 at least 10 times every 1-2 hours when you are awake. Take your time and take a few normal breaths between deep breaths. 9. The spirometer may include an indicator to show your best effort. Use the indicator as a goal to work toward during each repetition. 10. After each set of 10 deep breaths, practice coughing to be sure your lungs are clear. If you have an incision (the cut made at the time of surgery), support your incision when coughing by placing a pillow or rolled up towels firmly against it. Once you are able to get out of bed, walk around indoors and cough well. You may stop using the incentive spirometer when instructed by your caregiver.  RISKS AND COMPLICATIONS  Take your time so you do not get dizzy or light-headed.  If you are in pain, you may  need to take or ask for pain medication before doing incentive spirometry. It is harder to take a deep breath if you are having pain. AFTER USE  Rest and breathe slowly and easily.  It can be helpful to keep track of a log of your progress. Your caregiver can provide you with a simple table to help with this. If you are using the spirometer at home, follow these instructions: SEEK MEDICAL CARE IF:   You are having difficultly using the spirometer.  You have trouble using the spirometer as often as instructed.  Your pain medication is not giving enough relief while using the spirometer.  You develop fever of 100.5 F (38.1 C) or higher. SEEK IMMEDIATE MEDICAL CARE IF:   You cough up bloody sputum that had not been present before.  You develop fever of 102 F (38.9 C) or greater.  You develop worsening pain at or near the incision site. MAKE SURE YOU:   Understand these instructions.  Will watch your condition.  Will get help right away if you are not doing well or get worse. Document Released: 01/15/2007 Document Revised: 11/27/2011 Document Reviewed: 03/18/2007 ExitCare Patient Information 2014 ExitCare, [place].   ________________________________________________________________________  WHAT IS A BLOOD TRANSFUSION? Blood Transfusion Information  A transfusion is the replacement of blood or some of its parts. Blood is made up of multiple cells which provide different functions.  Red blood cells carry oxygen and are used for blood loss replacement.  White blood cells fight against infection.  Platelets control bleeding.  Plasma helps clot blood.  Other blood products are available for specialized needs, such as hemophilia or other clotting disorders. BEFORE THE TRANSFUSION  Who gives blood for transfusions?   Healthy volunteers who are fully evaluated to make sure their blood is safe. This is blood bank blood. Transfusion therapy is the safest it has ever been  in  the practice of medicine. Before blood is taken from a donor, a complete history is taken to make sure that person has no history of diseases nor engages in risky social behavior (examples are intravenous drug use or sexual activity with multiple partners). The donor's travel history is screened to minimize risk of transmitting infections, such as malaria. The donated blood is tested for signs of infectious diseases, such as HIV and hepatitis. The blood is then tested to be sure it is compatible with you in order to minimize the chance of a transfusion reaction. If you or a relative donates blood, this is often done in anticipation of surgery and is not appropriate for emergency situations. It takes many days to process the donated blood. RISKS AND COMPLICATIONS Although transfusion therapy is very safe and saves many lives, the main dangers of transfusion include:   Getting an infectious disease.  Developing a transfusion reaction. This is an allergic reaction to something in the blood you were given. Every precaution is taken to prevent this. The decision to have a blood transfusion has been considered carefully by your caregiver before blood is given. Blood is not given unless the benefits outweigh the risks. AFTER THE TRANSFUSION  Right after receiving a blood transfusion, you will usually feel much better and more energetic. This is especially true if your red blood cells have gotten low (anemic). The transfusion raises the level of the red blood cells which carry oxygen, and this usually causes an energy increase.  The nurse administering the transfusion will monitor you carefully for complications. HOME CARE INSTRUCTIONS  No special instructions are needed after a transfusion. You may find your energy is better. Speak with your caregiver about any limitations on activity for underlying diseases you may have. SEEK MEDICAL CARE IF:   Your condition is not improving after your  transfusion.  You develop redness or irritation at the intravenous (IV) site. SEEK IMMEDIATE MEDICAL CARE IF:  Any of the following symptoms occur over the next 12 hours:  Shaking chills.  You have a temperature by mouth above 102 F (38.9 C), not controlled by medicine.  Chest, back, or muscle pain.  People around you feel you are not acting correctly or are confused.  Shortness of breath or difficulty breathing.  Dizziness and fainting.  You get a rash or develop hives.  You have a decrease in urine output.  Your urine turns a dark color or changes to pink, red, or brown. Any of the following symptoms occur over the next 10 days:  You have a temperature by mouth above 102 F (38.9 C), not controlled by medicine.  Shortness of breath.  Weakness after normal activity.  The white part of the eye turns yellow (jaundice).  You have a decrease in the amount of urine or are urinating less often.  Your urine turns a dark color or changes to pink, red, or brown. Document Released: 09/01/2000 Document Revised: 11/27/2011 Document Reviewed: 04/20/2008 [facility] Patient Information 2014 [place], [place].  _______________________________________________________________________

## 2020-01-14 NOTE — Telephone Encounter (Signed)
I called [name] to schedule an appointment. He stated he would be available tomorrow morning so we agreed to meet at 10:00.  Jacqualine Code, EMT 01/14/20

## 2020-01-14 NOTE — Progress Notes (Signed)
PERIOPERATIVE PRESCRIPTION FOR IMPLANTED CARDIAC DEVICE PROGRAMMING  Patient Information: Name:  Shelton Square  DOB:  02/17/1961  MRN:  [id number]    Planned Procedure: Right Total Knee Arthroplasty  Surgeon: Dr. Luiz Blare  Date of Procedure: 01/30/2020  Cautery will be used.  Position during surgery: Supine   Please send documentation back to:  [facility] (Fax # [phone])   Device Information:  Clinic EP Physician:  Sherryl Manges, MD   Device Type:  Defibrillator Manufacturer and Phone #:  Medtronic: [phone] Pacemaker Dependent?:  No. Date of Last Device Check: 10/21/2019  Normal Device Function?:  Yes.    Electrophysiologist's Recommendations:   Have magnet available.  Provide continuous ECG monitoring when magnet is used or reprogramming is to be performed.   Procedure should not interfere with device function.  No device programming or magnet placement needed.  Per Device Clinic Standing Orders, [name], RN  4:05 PM 01/14/2020

## 2020-01-15 ENCOUNTER — Telehealth: Payer: Self-pay | Admitting: Family Medicine

## 2020-01-15 ENCOUNTER — Other Ambulatory Visit (HOSPITAL_COMMUNITY): Payer: Self-pay

## 2020-01-15 ENCOUNTER — Other Ambulatory Visit: Payer: Self-pay | Admitting: Internal Medicine

## 2020-01-15 MED ORDER — MIRTAZAPINE 30 MG PO TABS: 15.0000 mg | ORAL_TABLET | Freq: Every day | ORAL | 1 refills | Status: DC

## 2020-01-15 NOTE — Telephone Encounter (Signed)
1) Medication(s) Requested (by name):mirtazapine (REMERON) 30 MG tablet [[id number]   2) Pharmacy of Choice:[facility] - [place], [place] - [address] AT Portal to [facility]  [address], [place]   3) Special Requests:   Approved medications will be sent to the pharmacy, we will reach out if there is an issue.  Requests made after 3pm may not be addressed until the following business day!  If a patient is unsure of the name of the medication(s) please note and ask patient to call back when they are able to provide all info, do not send to responsible party until all information is available!

## 2020-01-15 NOTE — Progress Notes (Signed)
Paramedicine Encounter    Patient ID: [name] [name], male    DOB: Apr 14, 1961, 59 y.o.   MRN: [id number]   Patient Care Team: [name], MD as PCP - General (Family Medicine) [name], [name], MD as PCP - Advanced Heart Failure (Cardiology) [name], MD as PCP - Electrophysiology (Cardiology) [name], MD as PCP - Cardiology (Cardiology) [name], MD (Family Medicine) [name], LCSW as Social Worker (Licensed Clinical Social Worker)  Patient Active Problem List   Diagnosis Date Noted  . Hypokalemia 11/23/2019  . Primary osteoarthritis of right hip 11/03/2018  . VT (ventricular tachycardia) (HCC) 05/30/2017  . AKI (acute kidney injury) (HCC)   . Obesity 12/25/2016  . OSA (obstructive sleep apnea) 12/25/2016  . Hyperglycemia 12/25/2016  . Acute respiratory failure with hypoxia (HCC) 12/25/2016  . Chronic anticoagulation 06/27/2016  . Encounter for therapeutic drug monitoring 04/19/2015  . S/P ICD (internal cardiac defibrillator) procedure Medtronic CRT ICD 04/15/2015  . Atrial fibrillation with RVR - Paroxysmal 04/08/2015  . NICM 04/06/2015  . Troponin level elevated 04/06/2015  . Rectal bleeding 10/26/2011  . Hydrocele, left 05/24/2011  . Tobacco use disorder- 1/2 pk a day   . Alcohol abuse   . Obesity 09/27/2008  . CAD- minor CAD 2009 and 2016 09/27/2008  . VENTRICULAR TACHYCARDIA 09/27/2008  . Essential hypertension 06/19/2008  . Chronic systolic heart failure (HCC) 06/19/2008    Current Outpatient Medications:  .  albuterol (VENTOLIN HFA) 108 (90 Base) MCG/ACT inhaler, Inhale 1 puff into the lungs every 6 (six) hours as needed for wheezing or shortness of breath., Disp: 18 g, Rfl: 3 .  amiodarone (PACERONE) 200 MG tablet, Take 1 tablet (200 mg total) by mouth daily., Disp: 90 tablet, Rfl: 3 .  atorvastatin (LIPITOR) 80 MG tablet, TAKE 1 TABLET BY MOUTH ONCE DAILY AT  6  PM (Patient taking differently: Take 80 mg by mouth daily  at 6 PM. ), Disp: 30 tablet, Rfl: 6 .  mirtazapine (REMERON) 30 MG tablet, Take 15 mg by mouth at bedtime. , Disp: , Rfl:  .  potassium chloride SA (KLOR-CON) 20 MEQ tablet, Take 3 tablets (60 mEq total) by mouth daily., Disp: 360 tablet, Rfl: 3 .  rivaroxaban (XARELTO) 20 MG TABS tablet, Take 1 tablet (20 mg total) by mouth daily with supper., Disp: 30 tablet, Rfl: 6 .  sacubitril-valsartan (ENTRESTO) 24-26 MG, Take 1 tablet by mouth 2 (two) times daily., Disp: 60 tablet, Rfl: 3 .  sotalol (BETAPACE) 80 MG tablet, Take 1 tablet (80 mg total) by mouth every 12 (twelve) hours., Disp: 60 tablet, Rfl: 6 .  spironolactone (ALDACTONE) 25 MG tablet, TAKE 1 TABLET BY MOUTH ONCE DAILY (NEED  OFFICE  VISIT  FOR  FUTURE  REFILLS) (Patient taking differently: Take 25 mg by mouth daily. ), Disp: 30 tablet, Rfl: 6 .  torsemide (DEMADEX) 20 MG tablet, Take 3 tablets (60 mg total) by mouth daily., Disp: 540 tablet, Rfl: 3 .  Ascorbic Acid (VITAMIN C) 100 MG tablet, Take 100 mg by mouth daily., Disp: , Rfl:  .  triamcinolone cream (KENALOG) 0.1 %, Apply 1 application topically 2 (two) times daily. (Patient not taking: Reported on 01/15/2020), Disp: 80 g, Rfl: 1 No Known Allergies    Social History   Socioeconomic History  . Marital status: Divorced    Spouse name: Not on file  . Number of children: 2  . Years of education: Not on file  . Highest  education level: Not on file  Occupational History  . Occupation: Copy  . Occupation: disabled, odd jobs  Tobacco Use  . Smoking status: Former Smoker    Packs/day: 0.50    Years: 4.00    Pack years: 2.00    Types: Cigarettes    Quit date: 10/29/2005    Years since quitting: 14.2  . Smokeless tobacco: Never Used  . Tobacco comment: about a pack a week. social  Substance and Sexual Activity  . Alcohol use: Yes    Alcohol/week: 42.0 standard drinks    Types: 42 Cans of beer per week    Comment: 24 a week  . Drug use: Yes    Frequency: 7.0 times  per week    Types: Marijuana    Comment: has discontinued  . Sexual activity: Yes    Partners: Female  Other Topics Concern  . Not on file  Social History Narrative   ** Merged History Encounter **       Social Determinants of Health   Financial Resource Strain:   . Difficulty of Paying Living Expenses:   Food Insecurity:   . Worried About Programme researcher, broadcasting/film/video in the Last Year:   . Barista in the Last Year:   Transportation Needs: No Transportation Needs  . Lack of Transportation (Medical): No  . Lack of Transportation (Non-Medical): No  Physical Activity: Sufficiently Active  . Days of Exercise per Week: 7 days  . Minutes of Exercise per Session: 30 min  Stress:   . Feeling of Stress :   Social Connections:   . Frequency of Communication with Friends and Family:   . Frequency of Social Gatherings with Friends and Family:   . Attends Religious Services:   . Active Member of Clubs or Organizations:   . Attends Banker Meetings:   Marland Kitchen Marital Status:   Intimate Partner Violence:   . Fear of Current or Ex-Partner:   . Emotionally Abused:   Marland Kitchen Physically Abused:   . Sexually Abused:     Physical Exam Cardiovascular:     Rate and Rhythm: Normal rate and regular rhythm.     Pulses: Normal pulses.  Pulmonary:     Effort: Pulmonary effort is normal.     Breath sounds: Examination of the right-lower field reveals rales. Examination of the left-lower field reveals rales. Rales present.  Musculoskeletal:        General: Normal range of motion.     Right lower leg: No edema.     Left lower leg: No edema.  Skin:    General: Skin is warm and dry.     Capillary Refill: Capillary refill takes less than 2 seconds.  Neurological:     Mental Status: He is alert and oriented to person, place, and time.  Psychiatric:        Mood and Affect: Mood normal.         Future Appointments  Date Time Provider Department Center  01/16/2020 11:00 AM [name], MD [facility] None  01/20/2020 10:00 AM [facility] PAT 4 [facility] None  01/20/2020 11:45 AM [facility] PAT 6 [facility] None  02/02/2020 12:10 PM [facility] DEVICE REMOTES [facility]  03/30/2020  7:20 AM [facility] DEVICE REMOTES [facility]  06/29/2020  7:20 AM [facility] DEVICE REMOTES [facility]    BP 92/64 (BP Location: Left Arm, Patient Position: Sitting, Cuff Size: Large)   Pulse 67   Wt (!) 320 lb (145.2 kg)  SpO2 95%   BMI 38.95 kg/m   Weight yesterday- 320 lb Last visit weight- 319 lb  Mr Heffernan was seen at home today and reported feeling well. He denied chest pain, SOB, headache, dizziness, cough or fever since our last visit. He did complain of some increased orthopnea saying he is propping up on two pillows when he sleeps. He stated he has been compliant with his medications over the past week and his weight has been stable, although he is up 7 lbs since discharge from the hospital. His medications were verified and his pillbox was refilled He has an appointment at the HF clinic tomorrow so I will follow up there.   Jacquiline Doe, EMT 01/15/20  ACTION: Home visit completed Next visit planned for tomorrow

## 2020-01-16 ENCOUNTER — Encounter (HOSPITAL_COMMUNITY): Payer: Self-pay | Admitting: Cardiology

## 2020-01-16 ENCOUNTER — Ambulatory Visit (HOSPITAL_COMMUNITY)
Admission: RE | Admit: 2020-01-16 | Discharge: 2020-01-16 | Disposition: A | Discharge status: Home / Self Care | Payer: Medicare Other | Source: Ambulatory Visit | Attending: Cardiology | Admitting: Cardiology

## 2020-01-16 ENCOUNTER — Other Ambulatory Visit: Payer: Self-pay

## 2020-01-16 VITALS — BP 94/57 | HR 65 | Wt 328.2 lb

## 2020-01-16 DIAGNOSIS — Z8249 Family history of ischemic heart disease and other diseases of the circulatory system: Secondary | ICD-10-CM | POA: Insufficient documentation

## 2020-01-16 DIAGNOSIS — Z87891 Personal history of nicotine dependence: Secondary | ICD-10-CM | POA: Insufficient documentation

## 2020-01-16 DIAGNOSIS — J449 Chronic obstructive pulmonary disease, unspecified: Secondary | ICD-10-CM | POA: Diagnosis not present

## 2020-01-16 DIAGNOSIS — I472 Ventricular tachycardia: Secondary | ICD-10-CM | POA: Insufficient documentation

## 2020-01-16 DIAGNOSIS — I428 Other cardiomyopathies: Secondary | ICD-10-CM | POA: Insufficient documentation

## 2020-01-16 DIAGNOSIS — F1010 Alcohol abuse, uncomplicated: Secondary | ICD-10-CM | POA: Diagnosis not present

## 2020-01-16 DIAGNOSIS — G4733 Obstructive sleep apnea (adult) (pediatric): Secondary | ICD-10-CM | POA: Diagnosis not present

## 2020-01-16 DIAGNOSIS — Z95810 Presence of automatic (implantable) cardiac defibrillator: Secondary | ICD-10-CM | POA: Diagnosis not present

## 2020-01-16 DIAGNOSIS — I110 Hypertensive heart disease with heart failure: Secondary | ICD-10-CM | POA: Insufficient documentation

## 2020-01-16 DIAGNOSIS — Z79899 Other long term (current) drug therapy: Secondary | ICD-10-CM | POA: Diagnosis not present

## 2020-01-16 DIAGNOSIS — Z7901 Long term (current) use of anticoagulants: Secondary | ICD-10-CM | POA: Diagnosis not present

## 2020-01-16 DIAGNOSIS — I480 Paroxysmal atrial fibrillation: Secondary | ICD-10-CM | POA: Diagnosis not present

## 2020-01-16 DIAGNOSIS — I5022 Chronic systolic (congestive) heart failure: Secondary | ICD-10-CM | POA: Diagnosis present

## 2020-01-16 MED ORDER — POTASSIUM CHLORIDE CRYS ER 20 MEQ PO TBCR: 40.0000 meq | EXTENDED_RELEASE_TABLET | Freq: Two times a day (BID) | ORAL | 3 refills | Status: DC

## 2020-01-16 MED ORDER — TORSEMIDE 20 MG PO TABS: ORAL_TABLET | ORAL | 3 refills | Status: DC

## 2020-01-16 NOTE — Patient Instructions (Signed)
CUT BACK ON THE ALCOHOL   INCREASE Torsemide 60mg  (3 tabs) in the morning and 40mg  (2 tabs) in the evening   INCREASE Potassium to 40 meq (2 tabs) twice a day   Your physician recommends that you schedule a follow-up appointment in: 2 months with Dr   At the [facility], you and your health needs are our priority. As part of our continuing mission to provide you with exceptional heart care, we have created designated Provider Care Teams. These Care Teams include your primary Cardiologist (physician) and Advanced Practice Providers (APPs- Physician Assistants and Nurse Practitioners) who all work together to provide you with the care you need, when you need it.   You may see any of the following providers on your designated Care Team at your next follow up: Dr Shirlee Latch . Dr Marland Kitchen . Arvilla Meres, NP . Marca Ancona, PA . Tonye Becket, PharmD   Please be sure to bring in all your medications bottles to every appointment.

## 2020-01-17 NOTE — Progress Notes (Signed)
Advanced Heart Failure Clinic Note   Primary Care: Dr. Claris Gower, MD HF Cardiology: Dr. Aundra Dubin  HPI: [name] [name] is a 59 y.o. male with a past medical history of chronic systolic CHF  (EF 57% in April 2018), dilated NICM s/p BiV ICD, PAF with intermittent compliance with Xarelto, ETOH abuse, tobacco abuse, OSA on CPAP, and HTN.   Admitted 12/25/16-12/27/16 with acute on chronic systolic CHF, volume overload. He was diuresed 11 pounds on IV lasix.  Discharge weight was 294 pounds. It was felt that the etiology of his NICM was ETOH vs hypertension related.   Admitted 5/14-5/16/18 with volume overload, acute respiratory failure requiring Bipap. He was not taking his Entresto as he was confused about dosing and there was a mix up his pharmacy. He was diuresed with IV lasix, discharge weight was 295 pounds.   Admitted 05/30/17-06/02/17 with VT, ICD shock x 4. He is now on amiodarone and sotalol.  Echo in 12/20 showed EF 20-25%, severe LV dilation, moderate LVH, normal RV, small IVC.   He was admitted in 3/21 with ICD shock in setting of hypokalemia and heavy ETOH. Entresto was decreased with low BP.  He was diuresed due to volume overload.   Today he returns for HF followup.  He is stable symptomatically, dyspneic after walking about 50 yards.  No lightheadedness.  Chronic 2 pillow orthopnea.  Using CPAP.  Weight up about 12 lbs.  About a month ago, his torsemide dose was lowered.  He is still drinking about 6-8 beers/week.   ECG (personally reviewed): NSR, BiV paced  Labs (9/18): K 3.8, creatinine 1.02 Labs (11/18): K 4.4, creatinine 1.19, TSH normal, LFTs normal Labs (11/05/18): K 4 Creatinine 1.05  Labs (12/20): K 4, creatinine 1.19 Labs (3/21): K 4.1, creatinine 1.05, hgb 10.8, LFTs normal, TSH normal  Medtronic device interrogation: 81% BiV paced, fluid index < threshold.   PMH: 1. Chronic systolic CHF: Medtronic CRT-D.  Nonischemic cardiomyopathy. Possibly ETOH-related.  - LHC (2016): No  CAD.  - Echo (2016): EF 20-25%, diffuse hypokinesis.  - Echo (4/18): EF 15%, normal RV size and systolic function.  - Echo (11/18): EF 20%, mildly dilated LV with mild LVH, normal RV size with mildly decreased RV systolic function.  - Echo (12/20): EF 20-25%, severe LV dilation, moderate LVH, normal RV, small IVC.  2. Atrial fibrillation: Paroxysmal.   3. OSA: On CPAP.  4. ETOH abuse: Prior heavy ETOH.  5. H/o VT 6. COPD: Prior smoker.   ROS: All systems reviewed and negative except as per HPI.   Current Outpatient Medications  Medication Sig Dispense Refill  . albuterol (VENTOLIN HFA) 108 (90 Base) MCG/ACT inhaler Inhale 1 puff into the lungs every 6 (six) hours as needed for wheezing or shortness of breath. 18 g 3  . amiodarone (PACERONE) 200 MG tablet Take 1 tablet (200 mg total) by mouth daily. 90 tablet 3  . Ascorbic Acid (VITAMIN C) 100 MG tablet Take 100 mg by mouth daily.    Marland Kitchen atorvastatin (LIPITOR) 80 MG tablet Take 80 mg by mouth every evening.    . mirtazapine (REMERON) 30 MG tablet Take 0.5 tablets (15 mg total) by mouth at bedtime. 90 tablet 1  . potassium chloride SA (KLOR-CON) 20 MEQ tablet Take 2 tablets (40 mEq total) by mouth 2 (two) times daily. 240 tablet 3  . rivaroxaban (XARELTO) 20 MG TABS tablet Take 1 tablet (20 mg total) by mouth daily with supper. 30 tablet 6  . sacubitril-valsartan (  ENTRESTO) 24-26 MG Take 1 tablet by mouth 2 (two) times daily. 60 tablet 3  . sotalol (BETAPACE) 80 MG tablet Take 1 tablet (80 mg total) by mouth every 12 (twelve) hours. 60 tablet 6  . spironolactone (ALDACTONE) 25 MG tablet Take 25 mg by mouth daily.    Marland Kitchen torsemide (DEMADEX) 20 MG tablet Take 3 tablets (60 mg total) by mouth every morning AND 2 tablets (40 mg total) every evening. 300 tablet 3  . triamcinolone cream (KENALOG) 0.1 % Apply 1 application topically 2 (two) times daily. 80 g 1   No current facility-administered medications for this encounter.    No Known  Allergies    Social History   Socioeconomic History  . Marital status: Divorced    Spouse name: Not on file  . Number of children: 2  . Years of education: Not on file  . Highest education level: Not on file  Occupational History  . Occupation: Copy  . Occupation: disabled, odd jobs  Tobacco Use  . Smoking status: Former Smoker    Packs/day: 0.50    Years: 4.00    Pack years: 2.00    Types: Cigarettes    Quit date: 10/29/2005    Years since quitting: 14.2  . Smokeless tobacco: Never Used  . Tobacco comment: about a pack a week. social  Substance and Sexual Activity  . Alcohol use: Yes    Alcohol/week: 42.0 standard drinks    Types: 42 Cans of beer per week    Comment: 24 a week  . Drug use: Yes    Frequency: 7.0 times per week    Types: Marijuana    Comment: has discontinued  . Sexual activity: Yes    Partners: Female  Other Topics Concern  . Not on file  Social History Narrative   ** Merged History Encounter **       Social Determinants of Health   Financial Resource Strain:   . Difficulty of Paying Living Expenses:   Food Insecurity:   . Worried About Programme researcher, broadcasting/film/video in the Last Year:   . Barista in the Last Year:   Transportation Needs: No Transportation Needs  . Lack of Transportation (Medical): No  . Lack of Transportation (Non-Medical): No  Physical Activity: Sufficiently Active  . Days of Exercise per Week: 7 days  . Minutes of Exercise per Session: 30 min  Stress:   . Feeling of Stress :   Social Connections:   . Frequency of Communication with Friends and Family:   . Frequency of Social Gatherings with Friends and Family:   . Attends Religious Services:   . Active Member of Clubs or Organizations:   . Attends Banker Meetings:   Marland Kitchen Marital Status:   Intimate Partner Violence:   . Fear of Current or Ex-Partner:   . Emotionally Abused:   Marland Kitchen Physically Abused:   . Sexually Abused:       Family History  Problem  Relation Age of Onset  . Other Mother        cardiac surgery. late 1990s  . Heart Problems Mother        CABG AGE 50  . Congestive Heart Failure Father   . Healthy Father        AGE 4  . Coronary artery disease Other   . Healthy Brother        AGE 78  . Healthy Sister  AGE 24  . Healthy Sister        AGE 26  . Healthy Son   . Healthy Son   . Healthy Daughter     Vitals:   01/16/20 1109  BP: (!) 94/57  Pulse: 65  SpO2: 99%  Weight: (!) 148.9 kg (328 lb 3.2 oz)   Wt Readings from Last 3 Encounters:  01/16/20 (!) 148.9 kg (328 lb 3.2 oz)  01/15/20 (!) 145.2 kg (320 lb)  01/08/20 (!) 144.7 kg (319 lb)     PHYSICAL EXAM: General: NAD Neck: Thick JVP 8 cm, no thyromegaly or thyroid nodule.  Lungs: Clear to auscultation bilaterally with normal respiratory effort. CV: Nondisplaced PMI.  Heart regular S1/S2, no S3/S4, no murmur.  Trace ankle edema.  No carotid bruit.  Normal pedal pulses.  Abdomen: Soft, nontender, no hepatosplenomegaly, no distention.  Skin: Intact without lesions or rashes.  Neurologic: Alert and oriented x 3.  Psych: Normal affect. Extremities: No clubbing or cyanosis.  HEENT: Normal.   ASSESSMENT & PLAN: 1. Chronic systolic CHF: NICM, Echo 12/2016 EF 15%. Normal coronaries on cath in July 2016. Medtronic CRT-D device. ETOH vs HTN vs myocarditis as cause of cardiomyopathy. Echo 12/20 showed that EF remains 20-25%.  NYHA class III symptoms.  He does appear to have some volume overload on exam and weight is up.  - He did not tolerate Farxiga. - Increase torsemide to 60 qam/40 qpm with KCl 40 bid.  BMET today and in 10 days.   - Continue Entresto  49-51 mg twice a day. No BP room to increase today.  - Continue spironolactone 25 mg daily - Intolerant to Coreg in the past with hypotension, he is on sotalol and amiodarone.  - He needs to cut back on ETOH as he is still drinking a decent amount.  - Only 81% BiV pacing.  Will ask EP to address this, can  this be improved?  2. Paroxysmal atrial fibrillation: He is in NSR today.  - Continue amiodarone 200 mg daily.  Check LFTs/TSH today.  Should have regular eye exam.  - Continue Xarelto 20 mg daily.  CBC today.   3. ETOH abuse: Suspect this contributes to cardiomyopathy.  Still drinking, I strongly encouraged him to quit.   4. Tobacco abuse: No longer smoking.   5. VT: Followed closely by EP, he is now on amiodarone and sotalol.  6. OSA: Continue nightly CPAP.   Marca Ancona, MD 01/17/20

## 2020-01-20 ENCOUNTER — Encounter (HOSPITAL_COMMUNITY): Payer: Self-pay

## 2020-01-20 ENCOUNTER — Encounter (HOSPITAL_COMMUNITY)
Admission: RE | Admit: 2020-01-20 | Discharge: 2020-01-20 | Disposition: A | Discharge status: Home / Self Care | Payer: Medicare Other | Source: Ambulatory Visit | Attending: Orthopedic Surgery | Admitting: Orthopedic Surgery

## 2020-01-20 ENCOUNTER — Other Ambulatory Visit: Payer: Self-pay

## 2020-01-20 DIAGNOSIS — Z01812 Encounter for preprocedural laboratory examination: Secondary | ICD-10-CM | POA: Insufficient documentation

## 2020-01-20 HISTORY — DX: Other cardiomyopathies: I42.8

## 2020-01-20 HISTORY — DX: Unspecified atrial fibrillation: I48.91

## 2020-01-20 LAB — URINALYSIS, ROUTINE W REFLEX MICROSCOPIC
Bilirubin Urine: NEGATIVE
Glucose, UA: NEGATIVE mg/dL
Hgb urine dipstick: NEGATIVE
Ketones, ur: NEGATIVE mg/dL
Nitrite: POSITIVE — AB
Protein, ur: NEGATIVE mg/dL
Specific Gravity, Urine: 1.005 (ref 1.005–1.030)
pH: 6 (ref 5.0–8.0)

## 2020-01-20 LAB — COMPREHENSIVE METABOLIC PANEL
ALT: 18 U/L (ref 0–44)
AST: 13 U/L — ABNORMAL LOW (ref 15–41)
Albumin: 4.2 g/dL (ref 3.5–5.0)
Alkaline Phosphatase: 92 U/L (ref 38–126)
Anion gap: 7 (ref 5–15)
BUN: 22 mg/dL — ABNORMAL HIGH (ref 6–20)
CO2: 28 mmol/L (ref 22–32)
Calcium: 9.2 mg/dL (ref 8.9–10.3)
Chloride: 103 mmol/L (ref 98–111)
Creatinine, Ser: 1.22 mg/dL (ref 0.61–1.24)
GFR calc Af Amer: >60 mL/min (ref >60)
GFR calc non Af Amer: >60 mL/min (ref >60)
Glucose, Bld: 79 mg/dL (ref 70–99)
Potassium: 4.6 mmol/L (ref 3.5–5.1)
Sodium: 138 mmol/L (ref 135–145)
Total Bilirubin: 1.3 mg/dL — ABNORMAL HIGH (ref 0.3–1.2)
Total Protein: 7.3 g/dL (ref 6.5–8.1)

## 2020-01-20 LAB — SURGICAL PCR SCREEN
MRSA, PCR: NEGATIVE
Staphylococcus aureus: NEGATIVE

## 2020-01-20 LAB — CBC WITH DIFFERENTIAL/PLATELET
Abs Immature Granulocytes: 0.03 10*3/uL (ref 0.00–0.07)
Basophils Absolute: 0 10*3/uL (ref 0.0–0.1)
Basophils Relative: 0 %
Eosinophils Absolute: 0.2 10*3/uL (ref 0.0–0.5)
Eosinophils Relative: 4 %
HCT: 37 % — ABNORMAL LOW (ref 39.0–52.0)
Hemoglobin: 12.1 g/dL — ABNORMAL LOW (ref 13.0–17.0)
Immature Granulocytes: 1 %
Lymphocytes Relative: 18 %
Lymphs Abs: 1.1 10*3/uL (ref 0.7–4.0)
MCH: 32.4 pg (ref 26.0–34.0)
MCHC: 32.7 g/dL (ref 30.0–36.0)
MCV: 99.2 fL (ref 80.0–100.0)
Monocytes Absolute: 0.5 10*3/uL (ref 0.1–1.0)
Monocytes Relative: 8 %
Neutro Abs: 4.3 10*3/uL (ref 1.7–7.7)
Neutrophils Relative %: 69 %
Platelets: 171 10*3/uL (ref 150–400)
RBC: 3.73 MIL/uL — ABNORMAL LOW (ref 4.22–5.81)
RDW: 14.7 % (ref 11.5–15.5)
WBC: 6.2 10*3/uL (ref 4.0–10.5)
nRBC: 0 % (ref 0.0–0.2)

## 2020-01-20 LAB — PROTIME-INR
INR: 1.1 (ref 0.8–1.2)
Prothrombin Time: 13.3 seconds (ref 11.4–15.2)

## 2020-01-20 LAB — APTT: aPTT: 29 seconds (ref 24–36)

## 2020-01-20 NOTE — Progress Notes (Signed)
PCP - Dr. Sheran Fava last office visit 01/14/20 in epic Cardiologist - Dr. Jearld Pies Last office visit 01/16/20 in epic, J. Cleaver NP clearance telephone encounter 01/09/20 in epic  Last device check:  01/09/20 in epic Chest x-ray - 11/19/19 in epic EKG - 12/04/19 in epic Stress Test - N/A ECHO - 09/16/19 in epic Cardiac Cath - greater than 2 years  Sleep Study - greater than 2 years CPAP - Yes  Fasting Blood Sugar - N/A Checks Blood Sugar _N/A____ times a day  Blood Thinner Instructions: Xarelto per note 3 days, patient did not verbalize being aware instructed him to call MD for instrucions Aspirin Instructions: N/A Last Dose:N/A  Anesthesia review: ICD, OSA, CAD, CHF COPD, HX. OF V. TACH, EF 20% ON ECHO 12/20, AFIB  Patient denies shortness of breath, fever, cough and chest pain at PAT appointment   Patient verbalized understanding of instructions that were given to them at the PAT appointment. Patient was also instructed that they will need to review over the PAT instructions again at home before surgery.

## 2020-01-21 ENCOUNTER — Other Ambulatory Visit (HOSPITAL_COMMUNITY): Payer: Self-pay

## 2020-01-21 NOTE — Progress Notes (Signed)
Paramedicine Encounter    Patient ID: [name] [name], male    DOB: [birth year], 59 y.o.   MRN: [id number]   Patient Care Team: [name] Mask, MD as PCP - General (Family Medicine) [name] [name], [name], MD as PCP - Advanced Heart Failure (Cardiology) [name] Salvia, MD as PCP - Electrophysiology (Cardiology) [name] Morale, MD as PCP - Cardiology (Cardiology) [name] Mask, MD (Family Medicine) [name] Sis, LCSW as Social Worker (Licensed Clinical Social Worker)  Patient Active Problem List   Diagnosis Date Noted  . Hypokalemia 11/23/2019  . Primary osteoarthritis of right hip 11/03/2018  . VT (ventricular tachycardia) (HCC) 05/30/2017  . AKI (acute kidney injury) (HCC)   . Obesity 12/25/2016  . OSA (obstructive sleep apnea) 12/25/2016  . Hyperglycemia 12/25/2016  . Acute respiratory failure with hypoxia (HCC) 12/25/2016  . Chronic anticoagulation 06/27/2016  . Encounter for therapeutic drug monitoring 04/19/2015  . S/P ICD (internal cardiac defibrillator) procedure Medtronic CRT ICD 04/15/2015  . Atrial fibrillation with RVR - Paroxysmal 04/08/2015  . NICM 04/06/2015  . Troponin level elevated 04/06/2015  . Rectal bleeding 10/26/2011  . Hydrocele, left 05/24/2011  . Tobacco use disorder- 1/2 pk a day   . Alcohol abuse   . Obesity 09/27/2008  . CAD- minor CAD 2009 and 2016 09/27/2008  . VENTRICULAR TACHYCARDIA 09/27/2008  . Essential hypertension 06/19/2008  . Chronic systolic heart failure (HCC) 06/19/2008    Current Outpatient Medications:  .  albuterol (VENTOLIN HFA) 108 (90 Base) MCG/ACT inhaler, Inhale 1 puff into the lungs every 6 (six) hours as needed for wheezing or shortness of breath., Disp: 18 g, Rfl: 3 .  amiodarone (PACERONE) 200 MG tablet, Take 1 tablet (200 mg total) by mouth daily., Disp: 90 tablet, Rfl: 3 .  atorvastatin (LIPITOR) 80 MG tablet, Take 80 mg by mouth every evening., Disp: , Rfl:  .  mirtazapine (REMERON) 30 MG tablet, Take 0.5  tablets (15 mg total) by mouth at bedtime., Disp: 90 tablet, Rfl: 1 .  potassium chloride SA (KLOR-CON) 20 MEQ tablet, Take 2 tablets (40 mEq total) by mouth 2 (two) times daily., Disp: 240 tablet, Rfl: 3 .  rivaroxaban (XARELTO) 20 MG TABS tablet, Take 1 tablet (20 mg total) by mouth daily with supper., Disp: 30 tablet, Rfl: 6 .  sacubitril-valsartan (ENTRESTO) 24-26 MG, Take 1 tablet by mouth 2 (two) times daily., Disp: 60 tablet, Rfl: 3 .  sotalol (BETAPACE) 80 MG tablet, Take 1 tablet (80 mg total) by mouth every 12 (twelve) hours., Disp: 60 tablet, Rfl: 6 .  spironolactone (ALDACTONE) 25 MG tablet, Take 25 mg by mouth daily., Disp: , Rfl:  .  torsemide (DEMADEX) 20 MG tablet, Take 3 tablets (60 mg total) by mouth every morning AND 2 tablets (40 mg total) every evening., Disp: 300 tablet, Rfl: 3 .  triamcinolone cream (KENALOG) 0.1 %, Apply 1 application topically 2 (two) times daily., Disp: 80 g, Rfl: 1 .  Ascorbic Acid (VITAMIN C) 100 MG tablet, Take 100 mg by mouth daily., Disp: , Rfl:  No Known Allergies    Social History   Socioeconomic History  . Marital status: Divorced    Spouse name: Not on file  . Number of children: 2  . Years of education: Not on file  . Highest education level: Not on file  Occupational History  . Occupation: Copy  . Occupation: disabled, odd jobs  Tobacco Use  . Smoking status: Former Smoker    Packs/day: 0.50  Years: 4.00    Pack years: 2.00    Types: Cigarettes    Quit date: 10/29/2005    Years since quitting: 14.2  . Smokeless tobacco: Never Used  . Tobacco comment: about a pack a week. social  Substance and Sexual Activity  . Alcohol use: Yes    Alcohol/week: 42.0 standard drinks    Types: 42 Cans of beer per week    Comment: 24 a week  . Drug use: Yes    Frequency: 7.0 times per week    Types: Marijuana    Comment: daily  . Sexual activity: Yes    Partners: Female  Other Topics Concern  . Not on file  Social History  Narrative   ** Merged History Encounter **       Social Determinants of Health   Financial Resource Strain:   . Difficulty of Paying Living Expenses:   Food Insecurity:   . Worried About Charity fundraiser in the Last Year:   . Arboriculturist in the Last Year:   Transportation Needs: No Transportation Needs  . Lack of Transportation (Medical): No  . Lack of Transportation (Non-Medical): No  Physical Activity: Sufficiently Active  . Days of Exercise per Week: 7 days  . Minutes of Exercise per Session: 30 min  Stress:   . Feeling of Stress :   Social Connections:   . Frequency of Communication with Friends and Family:   . Frequency of Social Gatherings with Friends and Family:   . Attends Religious Services:   . Active Member of Clubs or Organizations:   . Attends Archivist Meetings:   Marland Kitchen Marital Status:   Intimate Partner Violence:   . Fear of Current or Ex-Partner:   . Emotionally Abused:   Marland Kitchen Physically Abused:   . Sexually Abused:     Physical Exam Cardiovascular:     Rate and Rhythm: Normal rate and regular rhythm.     Pulses: Normal pulses.  Pulmonary:     Effort: Pulmonary effort is normal.     Breath sounds: Normal breath sounds.  Musculoskeletal:        General: Normal range of motion.     Right lower leg: Edema present.     Left lower leg: Edema present.  Skin:    General: Skin is warm and dry.     Capillary Refill: Capillary refill takes less than 2 seconds.  Neurological:     Mental Status: He is oriented to person, place, and time.  Psychiatric:        Mood and Affect: Mood normal.         Future Appointments  Date Time Provider [place]  01/27/2020 11:30 AM [facility]-S[facility]ING [facility] None  02/02/2020 12:10 PM [facility] DEVICE REMOTES [facility]  03/18/2020 10:30 AM [facility] PA/NP [facility] None  03/30/2020  7:20 AM [facility] DEVICE REMOTES [facility]  06/29/2020  7:20 AM [facility] DEVICE REMOTES  [facility]    BP 94/60 (BP Location: Left Arm, Patient Position: Sitting, Cuff Size: Normal)   Pulse 76   Resp 16   Wt (!) 327 lb (148.3 kg)   SpO2 98%   BMI 39.80 kg/m   Weight yesterday- 326 lb Last visit weight- 326 lb  [name] [name] was seen at home today and reported feeling generally well. He denied chest pain, SOB, headache, dizziness, orthopnea, fever or cough over the past week. He was recently seen at the doctor and diagnosed with a UTI and  was started on antibiotics. He reported being compliant with his medications over the past week and his weight has been stable. His medications were verified and his pillbox was refilled. I will follow up next week.   Jacqualine Code, EMT 01/21/20  ACTION: Home visit completed Next visit planned for 1 week

## 2020-01-26 NOTE — Progress Notes (Addendum)
Anesthesia Chart Review   Case: [id number] Date/Time: 01/30/20 0700   Procedure: TOTAL KNEE ARTHROPLASTY (Right Knee)   Anesthesia type: Spinal   Pre-op diagnosis: RIGHT KNEE OSTEOARTHRITIIS   Location[facility] 08 / [facility]   Surgeons: [name], MD      DISCUSSION:59 y.o. former smoker (2 pack years, quit 10/29/05) with h/o HTN, OSA on CPAP, nonobstructive CAD, chronic systolic heart failure (EF 20-25% on Echo 09/16/2019), AICD, COPD, atrial fibrillation (on Xarelto), ETOH abuse, right knee OA scheduled for above procedure 01/30/2020 with Dr. [name].   He was admitted in 3/21 with ICD shock in setting of hypokalemia and heavy ETOH. Entresto was decreased with low BP.  He was diuresed due to volume overload.   Last seen by cardiologist, Dr. Marca Ancona, 01/16/2020.  Per OV note pt stable symptomatically.  Pt did appear to have som e volume overload, weight up.  Torsemide increased.    Clearance received from cardiologist which states, "Chart reviewed as part of pre-operative protocol coverage. Given past medical history and time since last visit, based on ACC/AHA guidelines, [name] [name] would be at acceptable risk for the planned procedure without further cardiovascular testing.  Patient with diagnosis ofafibon Xareltofor anticoagulation.  Procedure:RIGHT TOTAL KNEE ARTHROPLASTY Date of procedure:TBD CHADS2-VASc score of3(CHF, HTN, CAD) CrCl120 ml/min Per office protocol, patient can holdXareltofor 3days prior to procedure."  Anticipate pt can proceed with planned procedure barring acute status change and after evaluation DOS.    Addendum 01/29/20 1:28pm:  Received a call from Patient Outreach, Gwynneth Albright EMT, who reports pt has not held his Xarelto as advised.  His last dose was yesterday 01/30/2020.  I have informed Dr. Luiz Blare.   VS: BP 129/77   Pulse (!) 59   Temp 36.8 C (Oral)   Resp 18   Ht 6\' 4"  (1.93 m)   Wt (!) 147.9 kg   SpO2 99%   BMI 39.68 kg/m    PROVIDERS: , MD is PCP   [name], MD is Cardiologist  LABS: forwarded to surgeon (all labs ordered are listed, but only abnormal results are displayed)  Labs Reviewed  CBC WITH DIFFERENTIAL/PLATELET - Abnormal; Notable for the following components:      Result Value   RBC 3.73 (*)    Hemoglobin 12.1 (*)    HCT 37.0 (*)    All other components within normal limits  COMPREHENSIVE METABOLIC PANEL - Abnormal; Notable for the following components:   BUN 22 (*)    AST 13 (*)    Total Bilirubin 1.3 (*)    All other components within normal limits  URINALYSIS, ROUTINE W REFLEX MICROSCOPIC - Abnormal; Notable for the following components:   Nitrite POSITIVE (*)    Leukocytes,Ua MODERATE (*)    Bacteria, UA MANY (*)    All other components within normal limits  SURGICAL PCR SCREEN  APTT  PROTIME-INR  TYPE AND SCREEN     IMAGES:   EKG: 01/16/2020 Rate 63 bpm  Atrial-sensed ventricular-paced rhythm with prolonged AV conduction Abnormal ECG No significant change since last tracing  CV: Echo 09/16/2019 IMPRESSIONS    1. Left ventricular ejection fraction, by visual estimation, is 20 to  25%. The left ventricle has severely decreased function. There is  moderately increased left ventricular hypertrophy.  2. Left ventricular diastolic parameters are consistent with Grade II  diastolic dysfunction (pseudonormalization).  3. Severely dilated left ventricular internal cavity size.  4. The left ventricle demonstrates global hypokinesis.  5. Global right ventricle has normal systolic function.The right  ventricular size is normal. No increase in right ventricular wall  thickness.  6. A pacer wire is visualized in the RV.  7. Left atrial size was moderately dilated.  8. Right atrial size was mildly dilated.  9. The mitral valve is normal in structure. No evidence of mitral valve  regurgitation. No evidence of mitral stenosis.  10. The  tricuspid valve is normal in structure.  11. The aortic valve is tricuspid. Aortic valve regurgitation is not  visualized. No evidence of aortic valve sclerosis or stenosis.  12. TR signal is inadequate for assessing pulmonary artery systolic  pressure.  13. The inferior vena cava is normal in size with greater than 50%  respiratory variability, suggesting right atrial pressure of 3 mmHg.   Cardiac Cath 04/08/2015  Nonobstructive coronary artery disease.  Elevated LVEDP  Moderate pulmonary artery hypertension.  Cardiac output 5.5 L/min; CI 2.1.   Await echo results for LV function.  Continue medical therapy.  Past Medical History:  Diagnosis Date  . AICD (automatic cardioverter/defibrillator) present   . Arthritis   . Atrial fibrillation ([place])   . CAD (coronary artery disease)    Mild nonobstructive (Cath 09)  . Chronic systolic heart failure (HCC)    Nonischemic CM: echo 4/12 with EF 25-30%, grade 2 diast dysfxn, mild dilated aortic root 43 mm, trivial MR, mod LAE  . COPD (chronic obstructive pulmonary disease) ([place])   . Dyspnea   . Ejection fraction < 50% 08/2019   20-25% noted on ECHO  . H/O ETOH abuse   . HTN (hypertension)   . Hypersomnia   . Nonischemic cardiomyopathy ([place])   . Obesity, morbid ([place])   . OSA on CPAP   . Systolic and diastolic CHF, acute on chronic ([place]) 11/2016  . Tobacco use disorder   . Ventricular tachycardia [facility])     Past Surgical History:  Procedure Laterality Date  . CARDIAC CATHETERIZATION N/A 04/08/2015   Procedure: Right/Left Heart Cath and Coronary Angiography;  Surgeon: Jettie Booze, MD;  Location: [place] CV LAB;  Service: Cardiovascular;  Laterality: N/A;  . EP IMPLANTABLE DEVICE N/A 04/14/2015   Procedure: BiV ICD Insertion CRT-D;  Surgeon: Deboraha Sprang, MD;  Location: [place] CV LAB;  Service: Cardiovascular;  Laterality: N/A;  . HYDROCELE EXCISION / REPAIR    . TOTAL HIP ARTHROPLASTY Right 11/04/2018   Procedure:  TOTAL HIP ARTHROPLASTY ANTERIOR APPROACH;  Surgeon: Dorna Leitz, MD;  Location: [facility];  Service: Orthopedics;  Laterality: Right;  . TUMOR EXCISION Left   . 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 VASECTOMY      MEDICATIONS: . albuterol (VENTOLIN HFA) 108 (90 Base) MCG/ACT inhaler  . amiodarone (PACERONE) 200 MG tablet  . Ascorbic Acid (VITAMIN C) 100 MG tablet  . atorvastatin (LIPITOR) 80 MG tablet  . mirtazapine (REMERON) 30 MG tablet  . potassium chloride SA (KLOR-CON) 20 MEQ tablet  . rivaroxaban (XARELTO) 20 MG TABS tablet  . sacubitril-valsartan (ENTRESTO) 24-26 MG  . sotalol (BETAPACE) 80 MG tablet  . spironolactone (ALDACTONE) 25 MG tablet  . torsemide (DEMADEX) 20 MG tablet  . triamcinolone cream (KENALOG) 0.1 %   No current facility-administered medications for this encounter.    Maia Plan [facility] Pre-Surgical Testing [phone] 01/26/20  3:39 PM

## 2020-01-27 ENCOUNTER — Other Ambulatory Visit (HOSPITAL_COMMUNITY)
Admission: RE | Admit: 2020-01-27 | Discharge: 2020-01-27 | Disposition: A | Discharge status: Home / Self Care | Payer: Medicare Other | Source: Ambulatory Visit | Attending: Orthopedic Surgery | Admitting: Orthopedic Surgery

## 2020-01-27 DIAGNOSIS — Z20822 Contact with and (suspected) exposure to covid-19: Secondary | ICD-10-CM | POA: Insufficient documentation

## 2020-01-27 DIAGNOSIS — Z01812 Encounter for preprocedural laboratory examination: Secondary | ICD-10-CM | POA: Diagnosis present

## 2020-01-27 LAB — SARS CORONAVIRUS 2 (TAT 6-24 HRS): SARS Coronavirus 2: NEGATIVE

## 2020-01-29 ENCOUNTER — Other Ambulatory Visit (HOSPITAL_COMMUNITY): Payer: Self-pay

## 2020-01-29 MED ORDER — DEXTROSE 5 % IV SOLN
3.0000 g | INTRAVENOUS | Status: AC
  Administered 2020-01-30: 3 g via INTRAVENOUS
  Filled 2020-01-29: qty 3

## 2020-01-29 MED ORDER — BUPIVACAINE LIPOSOME 1.3 % IJ SUSP
20.0000 mL | Freq: Once | INTRAMUSCULAR | Status: DC
  Filled 2020-01-29: qty 20

## 2020-01-29 NOTE — Progress Notes (Signed)
VM left for patient to arrive at 0605 instead of 0530 on 5/14

## 2020-01-29 NOTE — Anesthesia Preprocedure Evaluation (Addendum)
Anesthesia Evaluation  Patient identified by MRN, date of birth, ID band Patient awake    Reviewed: Allergy & Precautions, NPO status , Patient's Chart, lab work & pertinent test results  Airway Mallampati: II  TM Distance: >3 FB Neck ROM: Full    Dental  (+) Dental Advisory Given   Pulmonary former smoker,    breath sounds clear to auscultation       Cardiovascular hypertension, + CAD and +CHF  + Cardiac Defibrillator  Rhythm:Regular  1. Left ventricular ejection fraction, by visual estimation, is 20 to  25%. The left ventricle has severely decreased function. There is  moderately increased left ventricular hypertrophy.  2. Left ventricular diastolic parameters are consistent with Grade II  diastolic dysfunction (pseudonormalization).  3. Severely dilated left ventricular internal cavity size.  4. The left ventricle demonstrates global hypokinesis.  5. Global right ventricle has normal systolic function.The right  ventricular size is normal. No increase in right ventricular wall  thickness.  6. A pacer wire is visualized in the RV.  7. Left atrial size was moderately dilated.  8. Right atrial size was mildly dilated.  9. The mitral valve is normal in structure. No evidence of mitral valve  regurgitation. No evidence of mitral stenosis.  10. The tricuspid valve is normal in structure.  11. The aortic valve is tricuspid. Aortic valve regurgitation is not  visualized. No evidence of aortic valve sclerosis or stenosis.  12. TR signal is inadequate for assessing pulmonary artery systolic  pressure.  13. The inferior vena cava is normal in size with greater than 50%  respiratory variability, suggesting right atrial pressure of 3 mmHg.    Neuro/Psych    GI/Hepatic   Endo/Other    Renal/GU Renal disease     Musculoskeletal   Abdominal   Peds  Hematology   Anesthesia Other Findings  Nonobstructive coronary  artery disease.  Elevated LVEDP  Moderate pulmonary artery hypertension.  Cardiac output 5.5 L/min; CI 2.1.  Reproductive/Obstetrics                          Anesthesia Physical Anesthesia Plan  ASA: III  Anesthesia Plan: General   Post-op Pain Management:  Regional for Post-op pain   Induction: Intravenous  PONV Risk Score and Plan: 2 and Ondansetron and Dexamethasone  Airway Management Planned: Oral ETT and LMA  Additional Equipment: None  Intra-op Plan:   Post-operative Plan: Extubation in OR  Informed Consent: I have reviewed the patients History and Physical, chart, labs and discussed the procedure including the risks, benefits and alternatives for the proposed anesthesia with the patient or authorized representative who has indicated his/her understanding and acceptance.     Dental advisory given  Plan Discussed with: CRNA and Surgeon  Anesthesia Plan Comments: (See PAT note 01/20/20, Jodell Cipro, PA-C)      Anesthesia Quick Evaluation

## 2020-01-29 NOTE — Progress Notes (Signed)
Paramedicine Encounter    Patient ID: [name] [name], male    DOB: [birth year], 59 y.o.   MRN: [id number]   Patient Care Team: [name], MD as PCP - General (Family Medicine) Haroldine Laws, Shaune Pascal, MD as PCP - Advanced Heart Failure (Cardiology) Deboraha Sprang, MD as PCP - Electrophysiology (Cardiology) Larey Dresser, MD as PCP - Cardiology (Cardiology) [name], MD (Family Medicine) Jorge Ny, LCSW as Social Worker (Licensed Clinical Social Worker)  Patient Active Problem List   Diagnosis Date Noted  . Hypokalemia 11/23/2019  . Primary osteoarthritis of right hip 11/03/2018  . VT (ventricular tachycardia) ([place]) 05/30/2017  . AKI (acute kidney injury) ([place])   . Obesity 12/25/2016  . OSA (obstructive sleep apnea) 12/25/2016  . Hyperglycemia 12/25/2016  . Acute respiratory failure with hypoxia ([place]) 12/25/2016  . Chronic anticoagulation 06/27/2016  . Encounter for therapeutic drug monitoring 04/19/2015  . S/P ICD (internal cardiac defibrillator) procedure Medtronic CRT ICD 04/15/2015  . Atrial fibrillation with RVR - Paroxysmal 04/08/2015  . NICM 04/06/2015  . Troponin level elevated 04/06/2015  . Rectal bleeding 10/26/2011  . Hydrocele, left 05/24/2011  . Tobacco use disorder- 1/2 pk a day   . Alcohol abuse   . Obesity 09/27/2008  . CAD- minor CAD 2009 and 2016 09/27/2008  . VENTRICULAR TACHYCARDIA 09/27/2008  . Essential hypertension 06/19/2008  . Chronic systolic heart failure ([place]) 06/19/2008    Current Outpatient Medications:  .  albuterol (VENTOLIN HFA) 108 (90 Base) MCG/ACT inhaler, Inhale 1 puff into the lungs every 6 (six) hours as needed for wheezing or shortness of breath., Disp: 18 g, Rfl: 3 .  amiodarone (PACERONE) 200 MG tablet, Take 1 tablet (200 mg total) by mouth daily., Disp: 90 tablet, Rfl: 3 .  Ascorbic Acid (VITAMIN C) 100 MG tablet, Take 100 mg by mouth daily., Disp: , Rfl:  .  atorvastatin (LIPITOR) 80 MG tablet, Take 80 mg  by mouth every evening., Disp: , Rfl:  .  mirtazapine (REMERON) 30 MG tablet, Take 0.5 tablets (15 mg total) by mouth at bedtime., Disp: 90 tablet, Rfl: 1 .  potassium chloride SA (KLOR-CON) 20 MEQ tablet, Take 2 tablets (40 mEq total) by mouth 2 (two) times daily., Disp: 240 tablet, Rfl: 3 .  rivaroxaban (XARELTO) 20 MG TABS tablet, Take 1 tablet (20 mg total) by mouth daily with supper., Disp: 30 tablet, Rfl: 6 .  sacubitril-valsartan (ENTRESTO) 24-26 MG, Take 1 tablet by mouth 2 (two) times daily., Disp: 60 tablet, Rfl: 3 .  sotalol (BETAPACE) 80 MG tablet, Take 1 tablet (80 mg total) by mouth every 12 (twelve) hours., Disp: 60 tablet, Rfl: 6 .  spironolactone (ALDACTONE) 25 MG tablet, Take 25 mg by mouth daily., Disp: , Rfl:  .  torsemide (DEMADEX) 20 MG tablet, Take 3 tablets (60 mg total) by mouth every morning AND 2 tablets (40 mg total) every evening., Disp: 300 tablet, Rfl: 3 .  triamcinolone cream (KENALOG) 0.1 %, Apply 1 application topically 2 (two) times daily., Disp: 80 g, Rfl: 1 No current facility-administered medications for this visit.  Facility-Administered Medications Ordered in Other Visits:  .  [START ON 01/30/2020] Exparel plus 20cc 0.25% MARCAINE WITH EPI plus 20cc saline, , , Once **AND** [START ON 01/30/2020] bupivacaine liposome (EXPAREL) 1.3 % injection 266 mg, 20 mL, Other, Once, Dorna Leitz, MD .  Derrill Memo ON 01/30/2020] ceFAZolin (ANCEF) 3 g in dextrose 5 % 50 mL IVPB, 3 g, Intravenous, On Call to OR,  [name], MD No Known Allergies    Social History   Socioeconomic History  . Marital status: Divorced    Spouse name: Not on file  . Number of children: 2  . Years of education: Not on file  . Highest education level: Not on file  Occupational History  . Occupation: Copy  . Occupation: disabled, odd jobs  Tobacco Use  . Smoking status: Former Smoker    Packs/day: 0.50    Years: 4.00    Pack years: 2.00    Types: Cigarettes    Quit date: 10/29/2005     Years since quitting: 14.2  . Smokeless tobacco: Never Used  . Tobacco comment: about a pack a week. social  Substance and Sexual Activity  . Alcohol use: Yes    Alcohol/week: 42.0 standard drinks    Types: 42 Cans of beer per week    Comment: 24 a week  . Drug use: Yes    Frequency: 7.0 times per week    Types: Marijuana    Comment: daily  . Sexual activity: Yes    Partners: Female  Other Topics Concern  . Not on file  Social History Narrative   ** Merged History Encounter **       Social Determinants of Health   Financial Resource Strain:   . Difficulty of Paying Living Expenses:   Food Insecurity:   . Worried About Programme researcher, broadcasting/film/video in the Last Year:   . Barista in the Last Year:   Transportation Needs: No Transportation Needs  . Lack of Transportation (Medical): No  . Lack of Transportation (Non-Medical): No  Physical Activity: Sufficiently Active  . Days of Exercise per Week: 7 days  . Minutes of Exercise per Session: 30 min  Stress:   . Feeling of Stress :   Social Connections:   . Frequency of Communication with Friends and Family:   . Frequency of Social Gatherings with Friends and Family:   . Attends Religious Services:   . Active Member of Clubs or Organizations:   . Attends Banker Meetings:   Marland Kitchen Marital Status:   Intimate Partner Violence:   . Fear of Current or Ex-Partner:   . Emotionally Abused:   Marland Kitchen Physically Abused:   . Sexually Abused:     Physical Exam      Future Appointments  Date Time Provider Department Center  02/02/2020 12:10 PM [facility] DEVICE REMOTES [facility]  03/18/2020 10:30 AM [facility] PA/NP [facility] None  03/30/2020  7:20 AM [facility] DEVICE REMOTES [facility]  06/29/2020  7:20 AM [facility] DEVICE REMOTES [facility]    [name] [name] was seen at home today to prepare his pillbox for the coming week. He noted that he was going in for surgery tomorrow but had not  stopped his Xarelto prior to this evening. I contacted [name] Cipro, PA-C, and made her aware of this and she advised she would pass this along to the surgical team. While we were on the phone she advised which medications [name] Dundon should and should not be taking tonight and tomorrow. I filled his pillbox accordingly. I will follow up next week.   Jacqualine Code, EMT 01/29/20  ACTION: Home visit completed Next visit planned for 1 week

## 2020-01-29 NOTE — H&P (Signed)
TOTAL KNEE ADMISSION H&P  Patient is being admitted for right total knee arthroplasty.  Subjective:  Chief Complaint:right knee pain.  HPI: [name] [name], 59 y.o. male, has a history of pain and functional disability in the right knee due to arthritis and has failed non-surgical conservative treatments for greater than 12 weeks to includeNSAID's and/or analgesics, corticosteriod injections, viscosupplementation injections, weight reduction as appropriate and activity modification.  Onset of symptoms was gradual, starting 3 years ago with gradually worsening course since that time. The patient noted no past surgery on the right knee(s).  Patient currently rates pain in the right knee(s) at 9 out of 10 with activity. Patient has night pain, worsening of pain with activity and weight bearing, pain that interferes with activities of daily living, pain with passive range of motion and joint swelling.  Patient has evidence of subchondral sclerosis, periarticular osteophytes and joint space narrowing by imaging studies. This patient has had femoral reasonable conservative care. There is no active infection.  Patient Active Problem List   Diagnosis Date Noted  . Hypokalemia 11/23/2019  . Primary osteoarthritis of right hip 11/03/2018  . VT (ventricular tachycardia) (HCC) 05/30/2017  . AKI (acute kidney injury) (HCC)   . Obesity 12/25/2016  . OSA (obstructive sleep apnea) 12/25/2016  . Hyperglycemia 12/25/2016  . Acute respiratory failure with hypoxia (HCC) 12/25/2016  . Chronic anticoagulation 06/27/2016  . Encounter for therapeutic drug monitoring 04/19/2015  . S/P ICD (internal cardiac defibrillator) procedure Medtronic CRT ICD 04/15/2015  . Atrial fibrillation with RVR - Paroxysmal 04/08/2015  . NICM 04/06/2015  . Troponin level elevated 04/06/2015  . Rectal bleeding 10/26/2011  . Hydrocele, left 05/24/2011  . Tobacco use disorder- 1/2 pk a day   . Alcohol abuse   . Obesity 09/27/2008  . CAD-  minor CAD 2009 and 2016 09/27/2008  . VENTRICULAR TACHYCARDIA 09/27/2008  . Essential hypertension 06/19/2008  . Chronic systolic heart failure (HCC) 06/19/2008   Past Medical History:  Diagnosis Date  . AICD (automatic cardioverter/defibrillator) present   . Arthritis   . Atrial fibrillation (HCC)   . CAD (coronary artery disease)    Mild nonobstructive (Cath 09)  . Chronic systolic heart failure (HCC)    Nonischemic CM: echo 4/12 with EF 25-30%, grade 2 diast dysfxn, mild dilated aortic root 43 mm, trivial MR, mod LAE  . COPD (chronic obstructive pulmonary disease) (HCC)   . Dyspnea   . Ejection fraction < 50% 08/2019   20-25% noted on ECHO  . H/O ETOH abuse   . HTN (hypertension)   . Hypersomnia   . Nonischemic cardiomyopathy (HCC)   . Obesity, morbid (HCC)   . OSA on CPAP   . Systolic and diastolic CHF, acute on chronic (HCC) 11/2016  . Tobacco use disorder   . Ventricular tachycardia [facility])     Past Surgical History:  Procedure Laterality Date  . CARDIAC CATHETERIZATION N/A 04/08/2015   Procedure: Right/Left Heart Cath and Coronary Angiography;  Surgeon: Corky Crafts, MD;  Location: [facility] INVASIVE CV LAB;  Service: Cardiovascular;  Laterality: N/A;  . EP IMPLANTABLE DEVICE N/A 04/14/2015   Procedure: BiV ICD Insertion CRT-D;  Surgeon: [name], MD;  Location: [facility] INVASIVE CV LAB;  Service: Cardiovascular;  Laterality: N/A;  . HYDROCELE EXCISION / REPAIR    . TOTAL HIP ARTHROPLASTY Right 11/04/2018   Procedure: TOTAL HIP ARTHROPLASTY ANTERIOR APPROACH;  Surgeon: [name], MD;  Location: [facility] ORS;  Service: Orthopedics;  Laterality: Right;  . TUMOR EXCISION  Left   . 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 VASECTOMY      Current Facility-Administered Medications  Medication Dose Route Frequency Provider Last Rate Last Admin  . [START ON 01/30/2020] bupivacaine liposome (EXPAREL) 1.3 % injection 266 mg  20 mL Other Once [name], MD      .  Melene Muller ON 01/30/2020] ceFAZolin (ANCEF) 3 g in dextrose 5 % 50 mL IVPB  3 g Intravenous On Call to OR [name], MD       Current Outpatient Medications  Medication Sig Dispense Refill Last Dose  . albuterol (VENTOLIN HFA) 108 (90 Base) MCG/ACT inhaler Inhale 1 puff into the lungs every 6 (six) hours as needed for wheezing or shortness of breath. 18 g 3   . amiodarone (PACERONE) 200 MG tablet Take 1 tablet (200 mg total) by mouth daily. 90 tablet 3   . Ascorbic Acid (VITAMIN C) 100 MG tablet Take 100 mg by mouth daily.     . rivaroxaban (XARELTO) 20 MG TABS tablet Take 1 tablet (20 mg total) by mouth daily with supper. 30 tablet 6   . sacubitril-valsartan (ENTRESTO) 24-26 MG Take 1 tablet by mouth 2 (two) times daily. 60 tablet 3   . sotalol (BETAPACE) 80 MG tablet Take 1 tablet (80 mg total) by mouth every 12 (twelve) hours. 60 tablet 6   . atorvastatin (LIPITOR) 80 MG tablet Take 80 mg by mouth every evening.     . mirtazapine (REMERON) 30 MG tablet Take 0.5 tablets (15 mg total) by mouth at bedtime. 90 tablet 1   . potassium chloride SA (KLOR-CON) 20 MEQ tablet Take 2 tablets (40 mEq total) by mouth 2 (two) times daily. 240 tablet 3   . spironolactone (ALDACTONE) 25 MG tablet Take 25 mg by mouth daily.     Marland Kitchen torsemide (DEMADEX) 20 MG tablet Take 3 tablets (60 mg total) by mouth every morning AND 2 tablets (40 mg total) every evening. 300 tablet 3   . triamcinolone cream (KENALOG) 0.1 % Apply 1 application topically 2 (two) times daily. 80 g 1    No Known Allergies  Social History   Tobacco Use  . Smoking status: Former Smoker    Packs/day: 0.50    Years: 4.00    Pack years: 2.00    Types: Cigarettes    Quit date: 10/29/2005    Years since quitting: 14.2  . Smokeless tobacco: Never Used  . Tobacco comment: about a pack a week. social  Substance Use Topics  . Alcohol use: Yes    Alcohol/week: 42.0 standard drinks    Types: 42 Cans of beer per week    Comment: 24 a week    Family  History  Problem Relation Age of Onset  . Other Mother        cardiac surgery. late 1990s  . Heart Problems Mother        CABG AGE 45  . Congestive Heart Failure Father   . Healthy Father        AGE 55  . Coronary artery disease Other   . Healthy Brother        AGE 45  . Healthy Sister        AGE 15  . Healthy Sister        AGE 43  . Healthy Son   . Healthy Son   . Healthy Daughter      Review of Systems ROS:  I have reviewed the patient's review of systems thoroughly and there are no positive responses as relates to the HPI. Objective:  Physical Exam  Vital signs in last 24 hours:   Well-developed well-nourished patient in no acute distress. Alert and oriented x3 HEENT:within normal limits Cardiac: Regular rate and rhythm Pulmonary: Lungs clear to auscultation Abdomen: Soft and nontender.  Normal active bowel sounds  Musculoskeletal: right knee: Painful range of motion.  Limited range of motion.  Trace effusion.  No instability.  Neurovascular intact distally. Labs: Recent Results (from the past 2160 hour(s))  MRSA PCR Screening     Status: None   Collection Time: 11/19/19  1:05 AM   Specimen: Nasal Mucosa; Nasopharyngeal  Result Value Ref Range   MRSA by PCR NEGATIVE NEGATIVE    Comment:        The GeneXpert MRSA Assay (FDA approved for NASAL specimens only), is one component of a comprehensive MRSA colonization surveillance program. It is not intended to diagnose MRSA infection nor to guide or monitor treatment for MRSA infections. Performed at [facility], [address] [place], [place] [address]   Basic metabolic panel     Status: Abnormal   Collection Time: 11/19/19  4:20 PM  Result Value Ref Range   Sodium 136 135 - 145 mmol/L   Potassium 3.0 (L) 3.5 - 5.1 mmol/L   Chloride 99 98 - 111 mmol/L   CO2 27 22 - 32 mmol/L   Glucose, Bld 169 (H) 70 - 99 mg/dL    Comment: Glucose reference range applies only to samples taken after fasting for at  least 8 hours.   BUN 10 6 - 20 mg/dL   Creatinine, Ser 1.22 0.61 - 1.24 mg/dL   Calcium 9.0 8.9 - 48.2 mg/dL   GFR calc non Af Amer >60 >60 mL/min   GFR calc Af Amer >60 >60 mL/min   Anion gap 10 5 - 15    Comment: Performed at [facility], [address], [place], [place] [address]  CBC with Differential     Status: Abnormal   Collection Time: 11/19/19  4:20 PM  Result Value Ref Range   WBC 7.0 4.0 - 10.5 K/uL   RBC 3.33 (L) 4.22 - 5.81 MIL/uL   Hemoglobin 10.8 (L) 13.0 - 17.0 g/dL   HCT 04.8 (L) 88.9 - 16.9 %   MCV 98.2 80.0 - 100.0 fL   MCH 32.4 26.0 - 34.0 pg   MCHC 33.0 30.0 - 36.0 g/dL   RDW 45.0 38.8 - 82.8 %   Platelets 176 150 - 400 K/uL   nRBC 0.0 0.0 - 0.2 %   Neutrophils Relative % 87 %   Neutro Abs 6.0 1.7 - 7.7 K/uL   Lymphocytes Relative 6 %   Lymphs Abs 0.4 (L) 0.7 - 4.0 K/uL   Monocytes Relative 7 %   Monocytes Absolute 0.5 0.1 - 1.0 K/uL   Eosinophils Relative 0 %   Eosinophils Absolute 0.0 0.0 - 0.5 K/uL   Basophils Relative 0 %   Basophils Absolute 0.0 0.0 - 0.1 K/uL   Immature Granulocytes 0 %   Abs Immature Granulocytes 0.03 0.00 - 0.07 K/uL    Comment: Performed at [facility], [address], [place], [place] [address]  Magnesium     Status: None   Collection Time: 11/19/19  4:20 PM  Result Value Ref Range   Magnesium 2.1 1.7 - 2.4 mg/dL    Comment: Performed  at [facility], [address], [place], [place] [address]  Troponin I (High Sensitivity)     Status: Abnormal   Collection Time: 11/19/19  4:20 PM  Result Value Ref Range   Troponin I (High Sensitivity) 18 (H) <18 ng/L    Comment: (NOTE) Elevated high sensitivity troponin I (hsTnI) values and significant  changes across serial measurements may suggest ACS but many other  chronic and acute conditions are known to elevate hsTnI results.  Refer to the "Links" section for chest pain algorithms and additional  guidance. Performed at [facility], [address], [place], [place] [address]   Respiratory Panel by RT PCR (Flu A&B, Covid) - Nasopharyngeal Swab     Status: None   Collection Time: 11/19/19  5:38 PM   Specimen: Nasopharyngeal Swab  Result Value Ref Range   SARS Coronavirus 2 by RT PCR NEGATIVE NEGATIVE    Comment: (NOTE) SARS-CoV-2 target nucleic acids are NOT DETECTED. The SARS-CoV-2 RNA is generally detectable in upper respiratoy specimens during the acute phase of infection. The lowest concentration of SARS-CoV-2 viral copies this assay can detect is 131 copies/mL. A negative result does not preclude SARS-Cov-2 infection and should not be used as the sole basis for treatment or other patient management decisions. A negative result may occur with  improper specimen collection/handling, submission of specimen other than nasopharyngeal swab, presence of viral mutation(s) within the areas targeted by this assay, and inadequate number of viral copies (<131 copies/mL). A negative result must be combined with clinical observations, patient history, and epidemiological information. The expected result is Negative. Fact Sheet for Patients:  https://www.moore.com/ Fact Sheet for Healthcare Providers:  https://www.young.biz/ This test is not yet ap proved or cleared by the [place] FDA and  has been authorized for detection and/or diagnosis of SARS-CoV-2 by FDA under an Emergency Use Authorization (EUA). This EUA will remain  in effect (meaning this test can be used) for the duration of the COVID-19 declaration under Section 564(b)(1) of the Act, 21 U.S.C. section 360bbb-3(b)(1), unless the authorization is terminated or revoked sooner.    Influenza A by PCR NEGATIVE NEGATIVE   Influenza B by PCR NEGATIVE NEGATIVE    Comment: (NOTE) The Xpert Xpress SARS-CoV-2/FLU/RSV assay is intended as an aid in  the diagnosis of influenza from Nasopharyngeal swab specimens and  should not be used as a  sole basis for treatment. Nasal washings and  aspirates are unacceptable for Xpert Xpress SARS-CoV-2/FLU/RSV  testing. Fact Sheet for Patients: https://www.moore.com/ Fact Sheet for Healthcare Providers: https://www.young.biz/ This test is not yet approved or cleared by the [place] FDA and  has been authorized for detection and/or diagnosis of SARS-CoV-2 by  FDA under an Emergency Use Authorization (EUA). This EUA will remain  in effect (meaning this test can be used) for the duration of the  Covid-19 declaration under Section 564(b)(1) of the Act, 21  U.S.C. section 360bbb-3(b)(1), unless the authorization is  terminated or revoked. Performed at [facility], [address], [place], [place] [address]   Brain natriuretic peptide     Status: Abnormal   Collection Time: 11/19/19  6:06 PM  Result Value Ref Range   B Natriuretic Peptide 964.1 (H) 0.0 - 100.0 pg/mL    Comment: Performed at [facility], [address], [place], [place] [address]  TSH     Status: None   Collection Time: 11/19/19  6:06 PM  Result Value Ref Range  TSH 0.701 0.350 - 4.500 uIU/mL    Comment: Performed by a 3rd Generation assay with a functional sensitivity of <=0.01 uIU/mL. Performed at [facility], [address], [place], [place] [address]   HIV Antibody (routine testing w rflx)     Status: None   Collection Time: 11/19/19  6:06 PM  Result Value Ref Range   HIV Screen 4th Generation wRfx NON REACTIVE NON REACTIVE    Comment: Performed at [facility], [address] [place], [place] [address]  Troponin I (High Sensitivity)     Status: Abnormal   Collection Time: 11/19/19  6:06 PM  Result Value Ref Range   Troponin I (High Sensitivity) 20 (H) <18 ng/L    Comment: (NOTE) Elevated high sensitivity troponin I (hsTnI) values and significant  changes across serial measurements may suggest ACS but many other  chronic and acute  conditions are known to elevate hsTnI results.  Refer to the "Links" section for chest pain algorithms and additional  guidance. Performed at [facility], [address] [place], [place] [address]   Basic metabolic panel     Status: Abnormal   Collection Time: 11/20/19  5:31 AM  Result Value Ref Range   Sodium 139 135 - 145 mmol/L   Potassium 3.2 (L) 3.5 - 5.1 mmol/L   Chloride 102 98 - 111 mmol/L   CO2 28 22 - 32 mmol/L   Glucose, Bld 149 (H) 70 - 99 mg/dL    Comment: Glucose reference range applies only to samples taken after fasting for at least 8 hours.   BUN 9 6 - 20 mg/dL   Creatinine, Ser 4.81 0.61 - 1.24 mg/dL   Calcium 8.6 (L) 8.9 - 10.3 mg/dL   GFR calc non Af Amer >60 >60 mL/min   GFR calc Af Amer >60 >60 mL/min   Anion gap 9 5 - 15    Comment: Performed at [facility], [address] [place], [place] [address]  Culture, blood (Routine X 2) w Reflex to ID Panel     Status: None   Collection Time: 11/20/19 10:45 AM   Specimen: BLOOD LEFT HAND  Result Value Ref Range   Specimen Description BLOOD LEFT HAND    Special Requests      BOTTLES DRAWN AEROBIC AND ANAEROBIC Blood Culture adequate volume   Culture      NO GROWTH 5 DAYS Performed at [facility], [address], [place], [place] [address]    Report Status 11/25/2019 FINAL   Culture, blood (Routine X 2) w Reflex to ID Panel     Status: None   Collection Time: 11/20/19 10:50 AM   Specimen: BLOOD RIGHT HAND  Result Value Ref Range   Specimen Description BLOOD RIGHT HAND    Special Requests      BOTTLES DRAWN AEROBIC AND ANAEROBIC Blood Culture adequate volume   Culture      NO GROWTH 5 DAYS Performed at [facility], [address] [place], [place] [address]    Report Status 11/25/2019 FINAL   Potassium     Status: None   Collection Time: 11/20/19  1:42 PM  Result Value Ref Range   Potassium 3.5 3.5 - 5.1 mmol/L    Comment: Performed at [facility], [address], [place], [place] [address]  Urinalysis, Complete w Microscopic     Status: Abnormal   Collection Time: 11/20/19  2:00 PM  Result Value Ref Range   Color, Urine YELLOW YELLOW   APPearance HAZY (A) CLEAR   Specific Gravity, Urine 1.006 1.005 - 1.030   pH 7.0 5.0 - 8.0   Glucose, UA NEGATIVE NEGATIVE mg/dL   Hgb urine dipstick NEGATIVE NEGATIVE   Bilirubin Urine NEGATIVE NEGATIVE   Ketones, ur NEGATIVE NEGATIVE mg/dL   Protein, ur NEGATIVE NEGATIVE mg/dL   Nitrite POSITIVE (A) NEGATIVE   Leukocytes,Ua SMALL (A) NEGATIVE   RBC / HPF 0-5 0 - 5 RBC/hpf   WBC, UA 6-10 0 - 5 WBC/hpf   Bacteria, UA FEW (A) NONE SEEN   Squamous Epithelial / LPF 0-5 0 - 5   Mucus PRESENT     Comment: Performed at [facility], [address], [place], [place] [address]  Basic metabolic panel     Status: Abnormal   Collection Time: 11/21/19  1:59 AM  Result Value Ref Range   Sodium 138 135 - 145 mmol/L   Potassium 3.4 (L) 3.5 - 5.1 mmol/L   Chloride 101 98 - 111 mmol/L   CO2 28 22 - 32 mmol/L   Glucose, Bld 144 (H) 70 - 99 mg/dL    Comment: Glucose reference range applies only to samples taken after fasting for at least 8 hours.   BUN 9 6 - 20 mg/dL   Creatinine, Ser 2.95 0.61 - 1.24 mg/dL   Calcium 8.8 (L) 8.9 - 10.3 mg/dL   GFR calc non Af Amer >60 >60 mL/min   GFR calc Af Amer >60 >60 mL/min   Anion gap 9 5 - 15    Comment: Performed at [facility], [address], [place], [place] [address]  Magnesium     Status: None   Collection Time: 11/21/19  1:59 AM  Result Value Ref Range   Magnesium 2.1 1.7 - 2.4 mg/dL    Comment: Performed at [facility], [address] [place], [place] [address]  Basic metabolic panel     Status: Abnormal   Collection Time: 11/22/19  2:46 AM  Result Value Ref Range   Sodium 138 135 - 145 mmol/L   Potassium 4.3 3.5 - 5.1 mmol/L   Chloride 101 98 - 111 mmol/L   CO2 29 22 - 32 mmol/L   Glucose, Bld 149 (H) 70 - 99 mg/dL    Comment: Glucose reference  range applies only to samples taken after fasting for at least 8 hours.   BUN 13 6 - 20 mg/dL   Creatinine, Ser 0.16 0.61 - 1.24 mg/dL   Calcium 9.3 8.9 - 01.0 mg/dL   GFR calc non Af Amer >60 >60 mL/min   GFR calc Af Amer >60 >60 mL/min   Anion gap 8 5 - 15    Comment: Performed at [facility], [address] [place], [place] [address]  Magnesium     Status: None   Collection Time: 11/22/19  2:46 AM  Result Value Ref Range   Magnesium 2.2 1.7 - 2.4 mg/dL    Comment: Performed at [facility], [address], [place], [place] [address]  Magnesium     Status: None   Collection Time: 11/23/19  2:07 AM  Result Value Ref Range   Magnesium 1.9 1.7 - 2.4 mg/dL    Comment: Performed at [facility], [address] [place], [place] [address]  Comprehensive metabolic panel     Status: Abnormal   Collection Time: 12/04/19  3:51 PM  Result Value Ref Range   Sodium 138 135 - 145 mmol/L   Potassium 4.6 3.5 - 5.1 mmol/L   Chloride 101 98 - 111 mmol/L   CO2 27 22 - 32 mmol/L   Glucose, Bld 115 (H) 70 - 99 mg/dL    Comment: Glucose reference range applies only to samples taken after fasting for at least 8 hours.   BUN 19 6 - 20 mg/dL   Creatinine, Ser 7.42 (H) 0.61 - 1.24 mg/dL   Calcium 9.1 8.9 - 59.5 mg/dL   Total Protein 6.4 (L) 6.5 - 8.1 g/dL   Albumin 3.6 3.5 - 5.0 g/dL   AST 16 15 - 41 U/L   ALT 20 0 - 44 U/L   Alkaline Phosphatase 81 38 - 126 U/L   Total Bilirubin 1.0 0.3 - 1.2 mg/dL   GFR calc non Af Amer 56 (L) >60 mL/min   GFR calc Af Amer >60 >60 mL/min   Anion gap 10 5 - 15    Comment: Performed at [facility], [address], [place], [place] [address]  CBC     Status: Abnormal   Collection Time: 12/04/19  3:51 PM  Result Value Ref Range   WBC 6.1 4.0 - 10.5 K/uL   RBC 3.42 (L) 4.22 - 5.81 MIL/uL   Hemoglobin 10.8 (L) 13.0 - 17.0 g/dL   HCT 64.3 (L) 32.9 - 51.8 %   MCV 98.2 80.0 - 100.0 fL   MCH 31.6 26.0 - 34.0 pg   MCHC 32.1 30.0 - 36.0 g/dL    RDW 84.1 66.0 - 63.0 %   Platelets 242 150 - 400 K/uL   nRBC 0.0 0.0 - 0.2 %    Comment: Performed at [facility], [address], [place], [place] [address]  Basic metabolic panel     Status: None   Collection Time: 12/15/19 12:03 PM  Result Value Ref Range   Sodium 142 135 - 145 mmol/L   Potassium 4.1 3.5 - 5.1 mmol/L   Chloride 104 98 - 111 mmol/L   CO2 29 22 - 32 mmol/L   Glucose, Bld 99 70 - 99 mg/dL    Comment: Glucose reference range applies only to samples taken after fasting for at least 8 hours.   BUN 11 6 - 20 mg/dL   Creatinine, Ser 9.32 0.61 - 1.24 mg/dL   Calcium 9.1 8.9 - 35.5 mg/dL   GFR calc non Af Amer >60 >60 mL/min   GFR calc Af Amer >60 >60 mL/min   Anion gap 9 5 - 15    Comment: Performed at [facility], [address], [place], [place] [address]  CUP PACEART REMOTE DEVICE CHECK     Status: None   Collection Time: 12/30/19 12:22 AM  Result Value Ref Range   Date Time Interrogation Session [phone]    Pulse Generator Manufacturer MERM    Pulse Gen Model [id number] Ovidio Kin XT CRT-D    Pulse Gen Serial Number [id number]    Clinic Name [facility]    Implantable Pulse Generator Type Cardiac Resynch Therapy Defibulator    Implantable Pulse Generator Implant Date [id number]    Implantable Lead Manufacturer [facility]    Implantable Lead Model 4598 Ian Bushman MRI SureScan    Implantable Lead Serial Number [id number]    Implantable Lead Implant Date 73710626    Implantable Lead Location Detail 1 UNKNOWN    Implantable Lead Location [id number]  Implantable Lead Manufacturer MERM    Implantable Lead Model 5076 CapSureFix Novus    Implantable Lead Serial Number [id number]    Implantable Lead Implant Date 16109604    Implantable Lead Location Detail 1 APPENDAGE    Implantable Lead Location [phone]    Implantable Lead Manufacturer [employer]    Implantable Lead Model [id number] Sprint Quattro Secure S    Implantable Lead Serial Number [id number]     Implantable Lead Implant Date 78295621    Implantable Lead Location Detail 1 APEX    Implantable Lead Location [id number]    Lead Channel Setting Sensing Sensitivity 0.3 mV   Lead Channel Setting Pacing Amplitude 1.5 V   Lead Channel Setting Pacing Pulse Width 0.03 ms   Lead Channel Setting Pacing Amplitude 0.5 V   Lead Channel Setting Pacing Pulse Width 0.6 ms   Lead Channel Setting Pacing Amplitude 2.75 V   Lead Channel Setting Pacing Capture Mode Adaptive Capture    Lead Channel Impedance Value 399 ohm   Lead Channel Sensing Intrinsic Amplitude 1.375 mV   Lead Channel Sensing Intrinsic Amplitude 1.375 mV   Lead Channel Pacing Threshold Amplitude 0.5 V   Lead Channel Pacing Threshold Pulse Width 0.4 ms   Lead Channel Impedance Value 418 ohm   Lead Channel Impedance Value 399 ohm   Lead Channel Sensing Intrinsic Amplitude 9.625 mV   Lead Channel Sensing Intrinsic Amplitude 9.625 mV   Lead Channel Pacing Threshold Amplitude 0.625 V   Lead Channel Pacing Threshold Pulse Width 0.4 ms   HighPow Impedance 63 ohm   Lead Channel Impedance Value 646 ohm   Lead Channel Impedance Value 646 ohm   Lead Channel Impedance Value 646 ohm   Lead Channel Impedance Value 399 ohm   Lead Channel Impedance Value 589 ohm   Lead Channel Impedance Value 589 ohm   Lead Channel Impedance Value 399 ohm   Lead Channel Impedance Value 342 ohm   Lead Channel Impedance Value 361 ohm   Lead Channel Impedance Value 342 ohm   Lead Channel Pacing Threshold Amplitude 1.625 V   Lead Channel Pacing Threshold Pulse Width 0.6 ms   Battery Status OK    Battery Remaining Longevity 15 mo   Battery Voltage 2.92 V   Brady Statistic RA Percent Paced 17.05 %   Brady Statistic RV Percent Paced 82.08 %   Brady Statistic AP VP Percent 1.49 %   Brady Statistic AS VP Percent 81.68 %   Brady Statistic AP VS Percent 15.8 %   Brady Statistic AS VS Percent 1.03 %  Surgical pcr screen     Status: None   Collection Time: 01/20/20  12:20 PM   Specimen: Nasal Mucosa; Nasal Swab  Result Value Ref Range   MRSA, PCR NEGATIVE NEGATIVE   Staphylococcus aureus NEGATIVE NEGATIVE    Comment: (NOTE) The Xpert SA Assay (FDA approved for NASAL specimens in patients 34 years of age and older), is one component of a comprehensive surveillance program. It is not intended to diagnose infection nor to guide or monitor treatment. Performed at [facility], [address] [place], [place] [address]   APTT     Status: None   Collection Time: 01/20/20 12:20 PM  Result Value Ref Range   aPTT 29 24 - 36 seconds    Comment: Performed at [facility], [address], [place], [place] [address]  CBC WITH DIFFERENTIAL     Status: Abnormal   Collection Time: 01/20/20 12:20  PM  Result Value Ref Range   WBC 6.2 4.0 - 10.5 K/uL   RBC 3.73 (L) 4.22 - 5.81 MIL/uL   Hemoglobin 12.1 (L) 13.0 - 17.0 g/dL   HCT 89.3 (L) 81.0 - 17.5 %   MCV 99.2 80.0 - 100.0 fL   MCH 32.4 26.0 - 34.0 pg   MCHC 32.7 30.0 - 36.0 g/dL   RDW 10.2 58.5 - 27.7 %   Platelets 171 150 - 400 K/uL   nRBC 0.0 0.0 - 0.2 %   Neutrophils Relative % 69 %   Neutro Abs 4.3 1.7 - 7.7 K/uL   Lymphocytes Relative 18 %   Lymphs Abs 1.1 0.7 - 4.0 K/uL   Monocytes Relative 8 %   Monocytes Absolute 0.5 0.1 - 1.0 K/uL   Eosinophils Relative 4 %   Eosinophils Absolute 0.2 0.0 - 0.5 K/uL   Basophils Relative 0 %   Basophils Absolute 0.0 0.0 - 0.1 K/uL   Immature Granulocytes 1 %   Abs Immature Granulocytes 0.03 0.00 - 0.07 K/uL    Comment: Performed at [facility], [address] [place], [place] [address]  Comprehensive metabolic panel     Status: Abnormal   Collection Time: 01/20/20 12:20 PM  Result Value Ref Range   Sodium 138 135 - 145 mmol/L   Potassium 4.6 3.5 - 5.1 mmol/L   Chloride 103 98 - 111 mmol/L   CO2 28 22 - 32 mmol/L   Glucose, Bld 79 70 - 99 mg/dL    Comment: Glucose reference range applies only to  samples taken after fasting for at least 8 hours.   BUN 22 (H) 6 - 20 mg/dL   Creatinine, Ser 5.36 0.61 - 1.24 mg/dL   Calcium 9.2 8.9 - 14.4 mg/dL   Total Protein 7.3 6.5 - 8.1 g/dL   Albumin 4.2 3.5 - 5.0 g/dL   AST 13 (L) 15 - 41 U/L   ALT 18 0 - 44 U/L   Alkaline Phosphatase 92 38 - 126 U/L   Total Bilirubin 1.3 (H) 0.3 - 1.2 mg/dL   GFR calc non Af Amer >60 >60 mL/min   GFR calc Af Amer >60 >60 mL/min   Anion gap 7 5 - 15    Comment: Performed at [facility], [address], [place], [place] [address]  Protime-INR     Status: None   Collection Time: 01/20/20 12:20 PM  Result Value Ref Range   Prothrombin Time 13.3 11.4 - 15.2 seconds   INR 1.1 0.8 - 1.2    Comment: (NOTE) INR goal varies based on device and disease states. Performed at [facility], [address] [place], [place] [address]   Type and screen Order type and screen if day of surgery is less than 15 days from draw of preadmission visit or order morning of surgery if day of surgery is greater than 6 days from preadmission visit.     Status: None   Collection Time: 01/20/20 12:20 PM  Result Value Ref Range   ABO/RH(D) O POS    Antibody Screen NEG    Sample Expiration 02/03/2020,2359    Extend sample reason      NO TRANSFUSIONS OR PREGNANCY IN THE PAST 3 MONTHS Performed at [facility], [address] [place], [place] [address]   Urinalysis, Routine w reflex microscopic     Status: Abnormal   Collection Time: 01/20/20 12:20 PM  Result Value Ref Range  Color, Urine YELLOW YELLOW   APPearance CLEAR CLEAR   Specific Gravity, Urine 1.005 1.005 - 1.030   pH 6.0 5.0 - 8.0   Glucose, UA NEGATIVE NEGATIVE mg/dL   Hgb urine dipstick NEGATIVE NEGATIVE   Bilirubin Urine NEGATIVE NEGATIVE   Ketones, ur NEGATIVE NEGATIVE mg/dL   Protein, ur NEGATIVE NEGATIVE mg/dL   Nitrite POSITIVE (A) NEGATIVE   Leukocytes,Ua MODERATE (A) NEGATIVE   RBC / HPF 0-5 0 - 5  RBC/hpf   WBC, UA 11-20 0 - 5 WBC/hpf   Bacteria, UA MANY (A) NONE SEEN   Squamous Epithelial / LPF 0-5 0 - 5   Mucus PRESENT    Hyaline Casts, UA PRESENT     Comment: Performed at [facility], [address], [place], [place] [address]  SARS CORONAVIRUS 2 (TAT 6-24 HRS) Nasopharyngeal Nasopharyngeal Swab     Status: None   Collection Time: 01/27/20 11:45 AM   Specimen: Nasopharyngeal Swab  Result Value Ref Range   SARS Coronavirus 2 NEGATIVE NEGATIVE    Comment: (NOTE) SARS-CoV-2 target nucleic acids are NOT DETECTED. The SARS-CoV-2 RNA is generally detectable in upper and lower respiratory specimens during the acute phase of infection. Negative results do not preclude SARS-CoV-2 infection, do not rule out co-infections with other pathogens, and should not be used as the sole basis for treatment or other patient management decisions. Negative results must be combined with clinical observations, patient history, and epidemiological information. The expected result is Negative. Fact Sheet for Patients: HairSlick.no Fact Sheet for Healthcare Providers: quierodirigir.com This test is not yet approved or cleared by the [place] FDA and  has been authorized for detection and/or diagnosis of SARS-CoV-2 by FDA under an Emergency Use Authorization (EUA). This EUA will remain  in effect (meaning this test can be used) for the duration of the COVID-19 declaration under Section 56 4(b)(1) of the Act, 21 U.S.C. section 360bbb-3(b)(1), unless the authorization is terminated or revoked sooner. Performed at [facility], [address], [place], [place] [address]     Estimated body mass index is 39.8 kg/m as calculated from the following:   Height as of 01/20/20:  (1.93 m).   Weight as of 01/21/20: 148.3 kg.   Imaging Review Plain radiographs demonstrate severe degenerative joint disease of the right  knee(s). The overall alignment ismild varus. The bone quality appears to be fair for age and reported activity level.      Assessment/Plan:  End stage arthritis, right knee   The patient history, physical examination, clinical judgment of the provider and imaging studies are consistent with end stage degenerative joint disease of the right knee(s) and total knee arthroplasty is deemed medically necessary. The treatment options including medical management, injection therapy arthroscopy and arthroplasty were discussed at length. The risks and benefits of total knee arthroplasty were presented and reviewed. The risks due to aseptic loosening, infection, stiffness, patella tracking problems, thromboembolic complications and other imponderables were discussed. The patient acknowledged the explanation, agreed to proceed with the plan and consent was signed. Patient is being admitted for inpatient treatment for surgery, pain control, PT, OT, prophylactic antibiotics, VTE prophylaxis, progressive ambulation and ADL's and discharge planning. The patient is planning to be discharged home with home health services     Patient's anticipated LOS is less than 2 midnights, meeting these requirements: - Younger than 68 - Lives within 1 hour of care - Has a competent adult at home to recover with post-op  recover - NO history of  - Chronic pain requiring opiods  - Diabetes  - Coronary Artery Disease  - Heart failure  - Heart attack  - Stroke  - DVT/VTE  - Cardiac arrhythmia  - Respiratory Failure/COPD  - Renal failure  - Anemia  - Advanced Liver disease

## 2020-01-30 ENCOUNTER — Ambulatory Visit (HOSPITAL_COMMUNITY): Payer: Medicare Other | Admitting: Physician Assistant

## 2020-01-30 ENCOUNTER — Encounter (HOSPITAL_COMMUNITY): Payer: Self-pay | Admitting: Orthopedic Surgery

## 2020-01-30 ENCOUNTER — Ambulatory Visit (HOSPITAL_COMMUNITY)
Admission: RE | Admit: 2020-01-30 | Discharge: 2020-01-30 | Disposition: A | Discharge status: Home / Self Care | Payer: Medicare Other | Source: Other Acute Inpatient Hospital | Attending: Orthopedic Surgery | Admitting: Orthopedic Surgery

## 2020-01-30 ENCOUNTER — Ambulatory Visit (HOSPITAL_COMMUNITY): Payer: Medicare Other | Admitting: Certified Registered Nurse Anesthetist

## 2020-01-30 ENCOUNTER — Encounter (HOSPITAL_COMMUNITY)
Admission: RE | Disposition: A | Discharge status: Home / Self Care | Payer: Self-pay | Source: Other Acute Inpatient Hospital | Attending: Orthopedic Surgery

## 2020-01-30 ENCOUNTER — Other Ambulatory Visit: Payer: Self-pay

## 2020-01-30 DIAGNOSIS — M1711 Unilateral primary osteoarthritis, right knee: Secondary | ICD-10-CM | POA: Diagnosis not present

## 2020-01-30 DIAGNOSIS — Z87891 Personal history of nicotine dependence: Secondary | ICD-10-CM | POA: Insufficient documentation

## 2020-01-30 DIAGNOSIS — I428 Other cardiomyopathies: Secondary | ICD-10-CM | POA: Diagnosis not present

## 2020-01-30 DIAGNOSIS — N179 Acute kidney failure, unspecified: Secondary | ICD-10-CM | POA: Insufficient documentation

## 2020-01-30 DIAGNOSIS — I110 Hypertensive heart disease with heart failure: Secondary | ICD-10-CM | POA: Insufficient documentation

## 2020-01-30 DIAGNOSIS — E669 Obesity, unspecified: Secondary | ICD-10-CM | POA: Insufficient documentation

## 2020-01-30 DIAGNOSIS — Z01811 Encounter for preprocedural respiratory examination: Secondary | ICD-10-CM

## 2020-01-30 DIAGNOSIS — Z8249 Family history of ischemic heart disease and other diseases of the circulatory system: Secondary | ICD-10-CM | POA: Insufficient documentation

## 2020-01-30 DIAGNOSIS — I2510 Atherosclerotic heart disease of native coronary artery without angina pectoris: Secondary | ICD-10-CM | POA: Insufficient documentation

## 2020-01-30 DIAGNOSIS — I480 Paroxysmal atrial fibrillation: Secondary | ICD-10-CM | POA: Diagnosis not present

## 2020-01-30 DIAGNOSIS — M1611 Unilateral primary osteoarthritis, right hip: Secondary | ICD-10-CM | POA: Insufficient documentation

## 2020-01-30 DIAGNOSIS — Z96641 Presence of right artificial hip joint: Secondary | ICD-10-CM | POA: Insufficient documentation

## 2020-01-30 DIAGNOSIS — M6281 Muscle weakness (generalized): Secondary | ICD-10-CM | POA: Diagnosis not present

## 2020-01-30 DIAGNOSIS — Z6839 Body mass index (BMI) 39.0-39.9, adult: Secondary | ICD-10-CM | POA: Insufficient documentation

## 2020-01-30 DIAGNOSIS — Z7901 Long term (current) use of anticoagulants: Secondary | ICD-10-CM | POA: Insufficient documentation

## 2020-01-30 DIAGNOSIS — J449 Chronic obstructive pulmonary disease, unspecified: Secondary | ICD-10-CM | POA: Diagnosis not present

## 2020-01-30 DIAGNOSIS — I5022 Chronic systolic (congestive) heart failure: Secondary | ICD-10-CM | POA: Insufficient documentation

## 2020-01-30 DIAGNOSIS — G4733 Obstructive sleep apnea (adult) (pediatric): Secondary | ICD-10-CM | POA: Insufficient documentation

## 2020-01-30 DIAGNOSIS — Z79899 Other long term (current) drug therapy: Secondary | ICD-10-CM | POA: Insufficient documentation

## 2020-01-30 HISTORY — PX: TOTAL KNEE ARTHROPLASTY: SHX125

## 2020-01-30 LAB — TYPE AND SCREEN
ABO/RH(D): O POS
Antibody Screen: NEGATIVE

## 2020-01-30 SURGERY — ARTHROPLASTY, KNEE, TOTAL
Anesthesia: Regional | Site: Knee | Laterality: Right

## 2020-01-30 MED ORDER — TRANEXAMIC ACID-NACL 1000-0.7 MG/100ML-% IV SOLN
INTRAVENOUS | Status: AC
  Filled 2020-01-30: qty 100

## 2020-01-30 MED ORDER — ROPIVACAINE HCL 7.5 MG/ML IJ SOLN
INTRAMUSCULAR | Status: DC | PRN
Start: 2020-01-30 — End: 2020-01-30
  Administered 2020-01-30: 20 mL via PERINEURAL

## 2020-01-30 MED ORDER — LACTATED RINGERS IV BOLUS
250.0000 mL | Freq: Once | INTRAVENOUS | Status: AC
  Administered 2020-01-30: 250 mL via INTRAVENOUS

## 2020-01-30 MED ORDER — WATER FOR IRRIGATION, STERILE IR SOLN
Status: DC | PRN
  Administered 2020-01-30: 2000 mL

## 2020-01-30 MED ORDER — FENTANYL CITRATE (PF) 100 MCG/2ML IJ SOLN
INTRAMUSCULAR | Status: DC | PRN
  Administered 2020-01-30 (×2): 50 ug via INTRAVENOUS

## 2020-01-30 MED ORDER — SODIUM CHLORIDE (PF) 0.9 % IJ SOLN
INTRAMUSCULAR | Status: AC
  Filled 2020-01-30: qty 50

## 2020-01-30 MED ORDER — LACTATED RINGERS IV BOLUS: 250.0000 mL | Freq: Once | INTRAVENOUS | Status: DC

## 2020-01-30 MED ORDER — VANCOMYCIN HCL IN DEXTROSE 1-5 GM/200ML-% IV SOLN
1000.0000 mg | Freq: Two times a day (BID) | INTRAVENOUS | Status: AC
  Administered 2020-01-30: 1000 mg via INTRAVENOUS

## 2020-01-30 MED ORDER — OXYCODONE HCL 5 MG PO TABS: 5.0000 mg | ORAL_TABLET | Freq: Once | ORAL | Status: DC | PRN

## 2020-01-30 MED ORDER — OXYCODONE HCL 5 MG/5ML PO SOLN: 5.0000 mg | Freq: Once | ORAL | Status: DC | PRN

## 2020-01-30 MED ORDER — BUPIVACAINE-EPINEPHRINE 0.5% -1:200000 IJ SOLN
INTRAMUSCULAR | Status: AC
  Filled 2020-01-30: qty 1

## 2020-01-30 MED ORDER — METHOCARBAMOL 500 MG IVPB - SIMPLE MED: 500.0000 mg | Freq: Four times a day (QID) | INTRAVENOUS | Status: DC | PRN

## 2020-01-30 MED ORDER — BUPIVACAINE-EPINEPHRINE 0.5% -1:200000 IJ SOLN
INTRAMUSCULAR | Status: DC | PRN
  Administered 2020-01-30: 50 mL

## 2020-01-30 MED ORDER — TRANEXAMIC ACID-NACL 1000-0.7 MG/100ML-% IV SOLN
1000.0000 mg | Freq: Once | INTRAVENOUS | Status: AC
  Administered 2020-01-30: 1000 mg via INTRAVENOUS

## 2020-01-30 MED ORDER — FENTANYL CITRATE (PF) 100 MCG/2ML IJ SOLN: 25.0000 ug | INTRAMUSCULAR | Status: DC | PRN

## 2020-01-30 MED ORDER — ACETAMINOPHEN 160 MG/5ML PO SOLN: 1000.0000 mg | Freq: Once | ORAL | Status: DC | PRN

## 2020-01-30 MED ORDER — MIDAZOLAM HCL 2 MG/2ML IJ SOLN
1.0000 mg | INTRAMUSCULAR | Status: DC
  Administered 2020-01-30: 2 mg via INTRAVENOUS
  Filled 2020-01-30: qty 2

## 2020-01-30 MED ORDER — TIZANIDINE HCL 2 MG PO TABS
2.0000 mg | ORAL_TABLET | Freq: Three times a day (TID) | ORAL | 0 refills | Status: DC | PRN
Start: 2020-01-30 — End: 2020-07-07

## 2020-01-30 MED ORDER — LACTATED RINGERS IV BOLUS
500.0000 mL | Freq: Once | INTRAVENOUS | Status: AC
  Administered 2020-01-30: 500 mL via INTRAVENOUS

## 2020-01-30 MED ORDER — PROPOFOL 1000 MG/100ML IV EMUL
INTRAVENOUS | Status: AC
  Filled 2020-01-30: qty 100

## 2020-01-30 MED ORDER — PHENYLEPHRINE HCL (PRESSORS) 10 MG/ML IV SOLN
INTRAVENOUS | Status: AC
  Filled 2020-01-30: qty 1

## 2020-01-30 MED ORDER — ACETAMINOPHEN 500 MG PO TABS: 1000.0000 mg | ORAL_TABLET | Freq: Once | ORAL | Status: DC | PRN

## 2020-01-30 MED ORDER — 0.9 % SODIUM CHLORIDE (POUR BTL) OPTIME
TOPICAL | Status: DC | PRN
  Administered 2020-01-30: 1000 mL

## 2020-01-30 MED ORDER — KETAMINE HCL 10 MG/ML IJ SOLN
INTRAMUSCULAR | Status: AC
  Filled 2020-01-30: qty 1

## 2020-01-30 MED ORDER — LIDOCAINE 2% (20 MG/ML) 5 ML SYRINGE
INTRAMUSCULAR | Status: AC
  Filled 2020-01-30: qty 5

## 2020-01-30 MED ORDER — BUPIVACAINE HCL (PF) 0.25 % IJ SOLN
INTRAMUSCULAR | Status: AC
  Filled 2020-01-30: qty 30

## 2020-01-30 MED ORDER — ONDANSETRON HCL 4 MG/2ML IJ SOLN
INTRAMUSCULAR | Status: DC | PRN
  Administered 2020-01-30: 4 mg via INTRAVENOUS

## 2020-01-30 MED ORDER — KETAMINE HCL 10 MG/ML IJ SOLN
INTRAMUSCULAR | Status: DC | PRN
Start: 2020-01-30 — End: 2020-01-30
  Administered 2020-01-30: 40 mg via INTRAVENOUS

## 2020-01-30 MED ORDER — LACTATED RINGERS IV SOLN: INTRAVENOUS | Status: DC

## 2020-01-30 MED ORDER — BUPIVACAINE LIPOSOME 1.3 % IJ SUSP
INTRAMUSCULAR | Status: DC | PRN
  Administered 2020-01-30: 20 mL

## 2020-01-30 MED ORDER — PROPOFOL 10 MG/ML IV BOLUS
INTRAVENOUS | Status: AC
  Filled 2020-01-30: qty 20

## 2020-01-30 MED ORDER — CHLORHEXIDINE GLUCONATE 0.12 % MT SOLN
15.0000 mL | Freq: Once | OROMUCOSAL | Status: AC
  Administered 2020-01-30: 15 mL via OROMUCOSAL

## 2020-01-30 MED ORDER — DOCUSATE SODIUM 100 MG PO CAPS
100.0000 mg | ORAL_CAPSULE | Freq: Two times a day (BID) | ORAL | 0 refills | Status: DC
Start: 2020-01-30 — End: 2020-07-07

## 2020-01-30 MED ORDER — FENTANYL CITRATE (PF) 100 MCG/2ML IJ SOLN
INTRAMUSCULAR | Status: AC
  Filled 2020-01-30: qty 2

## 2020-01-30 MED ORDER — ACETAMINOPHEN 10 MG/ML IV SOLN: 1000.0000 mg | Freq: Once | INTRAVENOUS | Status: DC | PRN

## 2020-01-30 MED ORDER — LIDOCAINE 2% (20 MG/ML) 5 ML SYRINGE
INTRAMUSCULAR | Status: DC | PRN
  Administered 2020-01-30: 80 mg via INTRAVENOUS

## 2020-01-30 MED ORDER — VANCOMYCIN HCL IN DEXTROSE 1-5 GM/200ML-% IV SOLN
INTRAVENOUS | Status: AC
  Filled 2020-01-30: qty 200

## 2020-01-30 MED ORDER — SODIUM CHLORIDE 0.9% FLUSH
INTRAVENOUS | Status: DC | PRN
  Administered 2020-01-30: 30 mL

## 2020-01-30 MED ORDER — SODIUM CHLORIDE 0.9 % IR SOLN
Status: DC | PRN
  Administered 2020-01-30: 1000 mL

## 2020-01-30 MED ORDER — PROPOFOL 10 MG/ML IV BOLUS
INTRAVENOUS | Status: DC | PRN
  Administered 2020-01-30: 140 mg via INTRAVENOUS

## 2020-01-30 MED ORDER — METHOCARBAMOL 500 MG IVPB - SIMPLE MED
INTRAVENOUS | Status: AC
  Administered 2020-01-30: 500 mg
  Filled 2020-01-30: qty 50

## 2020-01-30 MED ORDER — METHOCARBAMOL 500 MG PO TABS: 500.0000 mg | ORAL_TABLET | Freq: Four times a day (QID) | ORAL | Status: DC | PRN

## 2020-01-30 MED ORDER — MIDAZOLAM HCL 2 MG/2ML IJ SOLN
INTRAMUSCULAR | Status: AC
  Filled 2020-01-30: qty 2

## 2020-01-30 MED ORDER — FENTANYL CITRATE (PF) 100 MCG/2ML IJ SOLN
50.0000 ug | INTRAMUSCULAR | Status: DC
  Administered 2020-01-30: 100 ug via INTRAVENOUS
  Filled 2020-01-30: qty 2

## 2020-01-30 MED ORDER — POVIDONE-IODINE 10 % EX SWAB
2.0000 "application " | Freq: Once | CUTANEOUS | Status: AC
  Administered 2020-01-30: 2 via TOPICAL

## 2020-01-30 MED ORDER — MIDAZOLAM HCL 2 MG/2ML IJ SOLN
INTRAMUSCULAR | Status: DC | PRN
  Administered 2020-01-30 (×2): 1 mg via INTRAVENOUS

## 2020-01-30 MED ORDER — OXYCODONE-ACETAMINOPHEN 5-325 MG PO TABS: 1.0000 | ORAL_TABLET | Freq: Four times a day (QID) | ORAL | 0 refills | Status: DC | PRN

## 2020-01-30 MED ORDER — DEXAMETHASONE SODIUM PHOSPHATE 10 MG/ML IJ SOLN
INTRAMUSCULAR | Status: DC | PRN
  Administered 2020-01-30: 10 mg via INTRAVENOUS

## 2020-01-30 MED ORDER — TRANEXAMIC ACID-NACL 1000-0.7 MG/100ML-% IV SOLN
1000.0000 mg | INTRAVENOUS | Status: AC
  Administered 2020-01-30: 1000 mg via INTRAVENOUS
  Filled 2020-01-30: qty 100

## 2020-01-30 SURGICAL SUPPLY — 54 items
ATTUNE MED DOME PAT 41 KNEE (Knees) ×2 IMPLANT
ATTUNE PS FEM RT SZ 8 CEM KNEE (Femur) ×2 IMPLANT
ATTUNE PSRP INSR SZ8 10 KNEE (Insert) ×2 IMPLANT
BAG ZIPLOCK 12X15 (MISCELLANEOUS) ×2 IMPLANT
BASE TIBIAL ATTUNE KNEE SZ9 (Knees) ×1 IMPLANT
BENZOIN TINCTURE PRP APPL 2/3 (GAUZE/BANDAGES/DRESSINGS) ×2 IMPLANT
BLADE SAGITTAL 25.0X1.19X90 (BLADE) ×2 IMPLANT
BLADE SAW SGTL 13.0X1.19X90.0M (BLADE) ×2 IMPLANT
BLADE SURG SZ10 CARB STEEL (BLADE) ×4 IMPLANT
BNDG ELASTIC 6X10 VLCR STRL LF (GAUZE/BANDAGES/DRESSINGS) ×2 IMPLANT
BNDG ELASTIC 6X5.8 VLCR STR LF (GAUZE/BANDAGES/DRESSINGS) ×2 IMPLANT
BOOTIES KNEE HIGH SLOAN (MISCELLANEOUS) ×2 IMPLANT
BOWL SMART MIX CTS (DISPOSABLE) ×2 IMPLANT
CEMENT HV SMART SET (Cement) ×4 IMPLANT
COVER SURGICAL LIGHT HANDLE (MISCELLANEOUS) ×2 IMPLANT
COVER WAND RF STERILE (DRAPES) IMPLANT
CUFF TOURN SGL QUICK 34 (TOURNIQUET CUFF) ×1
CUFF TRNQT CYL 34X4.125X (TOURNIQUET CUFF) ×1 IMPLANT
DECANTER SPIKE VIAL GLASS SM (MISCELLANEOUS) ×4 IMPLANT
DRAPE U-SHAPE 47X51 STRL (DRAPES) ×2 IMPLANT
DRESSING AQUACEL AG SP 3.5X10 (GAUZE/BANDAGES/DRESSINGS) ×1 IMPLANT
DRSG AQUACEL AG ADV 3.5X10 (GAUZE/BANDAGES/DRESSINGS) ×2 IMPLANT
DRSG AQUACEL AG SP 3.5X10 (GAUZE/BANDAGES/DRESSINGS) ×2
DURAPREP 26ML APPLICATOR (WOUND CARE) ×2 IMPLANT
ELECT REM PT RETURN 15FT ADLT (MISCELLANEOUS) ×2 IMPLANT
GLOVE BIOGEL PI IND STRL 8 (GLOVE) ×2 IMPLANT
GLOVE BIOGEL PI INDICATOR 8 (GLOVE) ×2
GLOVE ECLIPSE 7.5 STRL STRAW (GLOVE) ×4 IMPLANT
GOWN STRL REUS W/TWL XL LVL3 (GOWN DISPOSABLE) ×4 IMPLANT
HANDPIECE INTERPULSE COAX TIP (DISPOSABLE) ×1
HOLDER FOLEY CATH W/STRAP (MISCELLANEOUS) IMPLANT
HOOD PEEL AWAY FLYTE STAYCOOL (MISCELLANEOUS) ×6 IMPLANT
KIT TURNOVER KIT A (KITS) IMPLANT
MANIFOLD NEPTUNE II (INSTRUMENTS) ×2 IMPLANT
NEEDLE HYPO 22GX1.5 SAFETY (NEEDLE) ×2 IMPLANT
NS IRRIG 1000ML POUR BTL (IV SOLUTION) ×2 IMPLANT
PACK ICE MAXI GEL EZY WRAP (MISCELLANEOUS) ×2 IMPLANT
PACK TOTAL KNEE CUSTOM (KITS) ×2 IMPLANT
PADDING CAST COTTON 6X4 STRL (CAST SUPPLIES) ×2 IMPLANT
PENCIL SMOKE EVACUATOR (MISCELLANEOUS) IMPLANT
PIN DRILL FIX HALF THREAD (BIT) ×2 IMPLANT
PIN STEINMAN FIXATION KNEE (PIN) ×2 IMPLANT
PROTECTOR NERVE ULNAR (MISCELLANEOUS) ×2 IMPLANT
SET HNDPC FAN SPRY TIP SCT (DISPOSABLE) ×1 IMPLANT
STRIP CLOSURE SKIN 1/2X4 (GAUZE/BANDAGES/DRESSINGS) ×2 IMPLANT
SUT MNCRL AB 3-0 PS2 18 (SUTURE) ×2 IMPLANT
SUT VIC AB 0 CT1 36 (SUTURE) ×2 IMPLANT
SUT VIC AB 1 CT1 36 (SUTURE) ×4 IMPLANT
SYR CONTROL 10ML LL (SYRINGE) ×4 IMPLANT
TIBIAL BASE ATTUNE KNEE SZ9 (Knees) ×2 IMPLANT
TRAY FOLEY MTR SLVR 16FR STAT (SET/KITS/TRAYS/PACK) ×2 IMPLANT
WATER STERILE IRR 1000ML POUR (IV SOLUTION) ×4 IMPLANT
WRAP KNEE MAXI GEL POST OP (GAUZE/BANDAGES/DRESSINGS) ×2 IMPLANT
YANKAUER SUCT BULB TIP 10FT TU (MISCELLANEOUS) ×2 IMPLANT

## 2020-01-30 NOTE — Anesthesia Postprocedure Evaluation (Signed)
Anesthesia Post Note  Patient: [name] [name]  Procedure(s) Performed: TOTAL KNEE ARTHROPLASTY (Right Knee)     Patient location during evaluation: PACU Anesthesia Type: Regional and General Level of consciousness: awake and alert Pain management: pain level controlled Vital Signs Assessment: post-procedure vital signs reviewed and stable Respiratory status: spontaneous breathing, nonlabored ventilation, respiratory function stable and patient connected to nasal cannula oxygen Cardiovascular status: blood pressure returned to baseline and stable Postop Assessment: no apparent nausea or vomiting Anesthetic complications: no    Last Vitals:  Vitals:   01/30/20 1415 01/30/20 1515  BP: 112/76 114/65  Pulse:  (!) 51  Resp: 16 16  Temp: 36.7 C 36.7 C  SpO2: 91% 91%    Last Pain:  Vitals:   01/30/20 1515  TempSrc:   PainSc: 0-No pain                 Lanora Reveron

## 2020-01-30 NOTE — Progress Notes (Signed)
AssistedDr. Moser with right, ultrasound guided, adductor canal block. Side rails up, monitors on throughout procedure. See vital signs in flow sheet. Tolerated Procedure well.  

## 2020-01-30 NOTE — Anesthesia Procedure Notes (Signed)
Procedure Name: LMA Insertion Date/Time: 01/30/2020 7:57 AM Performed by: Basilio Cairo, CRNA Pre-anesthesia Checklist: Patient identified, Patient being monitored, Timeout performed, Emergency Drugs available and Suction available Patient Re-evaluated:Patient Re-evaluated prior to induction Oxygen Delivery Method: Circle system utilized Preoxygenation: Pre-oxygenation with 100% oxygen Induction Type: IV induction Ventilation: Mask ventilation without difficulty LMA: LMA inserted and LMA with gastric port inserted LMA Size: 5.0 Tube type: Oral Number of attempts: 1 Placement Confirmation: positive ETCO2 and breath sounds checked- equal and bilateral Tube secured with: Tape Dental Injury: Teeth and Oropharynx as per pre-operative assessment

## 2020-01-30 NOTE — Discharge Instructions (Signed)

## 2020-01-30 NOTE — Evaluation (Signed)
Physical Therapy Evaluation Patient Details Name: [name] [name] MRN: [id number] DOB: 19-Jul-1961 Today's Date: 01/30/2020   History of Present Illness  Patient is 59 y.o. male s/p Rt TKA on 01/30/20 with PMH of V-tach, CHF, obesity, cardiomyopathy, HTN, hx of ETOH abuse, COPD, CAD, A-fib, OA, Rt THA.     Clinical Impression  [name] [name] is a 59 y.o. male POD 0 s/p Rt TKA. Patient reports independence with mobility at baseline. Patient is now limited by functional impairments (see PT problem list below) and requires min guard/supervision for transfers and gait with RW. Patient was able to ambulate ~130 feet with RW and min guard and cues for safe walker management. Patient instructed in exercises to facilitate ROM and circulation. Discussed pt's limited Rt ankle dorsiflexion and need for knee immobilizer for safety for discharge home with MD. After discussion Dr. Berenice Primas feels pt is safe and dorsiflexion strength will return within ~48 hours. Patient instructed on safety concerns with mobilizing with foot drop, educated on need for advancing LE with hip hike as he will have knee immobilizer on for safety. Patient will benefit from continued skilled PT interventions to address impairments and progress towards PLOF. Patient has met mobility goals at adequate level for discharge home; will continue to follow if pt continues acute stay to progress towards Mod I goals.     Follow Up Recommendations Follow surgeon's recommendation for DC plan and follow-up therapies;Home health PT    Equipment Recommendations  None recommended by PT    Recommendations for Other Services       Precautions / Restrictions Precautions Precautions: Fall Restrictions Weight Bearing Restrictions: No      Mobility  Bed Mobility Overal bed mobility: Needs Assistance Bed Mobility: Supine to Sit;Sit to Supine     Supine to sit: Supervision Sit to supine: Supervision   General bed mobility comments: pt able to complete  supine to sit transfer from flat bed, significant effort from patient required. patient able to raise bil LE's into bed without difficulty.   Transfers Overall transfer level: Needs assistance Equipment used: Rolling walker (2 wheeled) Transfers: Sit to/from Stand Sit to Stand: Min guard;Supervision         General transfer comment: cues for technique with RW, no assist to needed to rise and pt steady.  Ambulation/Gait Ambulation/Gait assistance: Min guard;Supervision Gait Distance (Feet): 130 Feet Assistive device: Rolling walker (2 wheeled) Gait Pattern/deviations: Step-to pattern;Decreased stance time - right;Decreased stride length;Decreased weight shift to right Gait velocity: decreased   General Gait Details: pt required cues for safe step pattern and proximity to RW at start, then maintained throughout. Patient able to reduce WB on Rt LE to prevent buckling. Pt with no active dorsiflexion on Rt LE and cues required to lift at hip and knee to advance LE without toe dragging.   Stairs       Wheelchair Mobility    Modified Rankin (Stroke Patients Only)       Balance Overall balance assessment: Mild deficits observed, not formally tested              Pertinent Vitals/Pain Pain Assessment: 0-10 Pain Score: 5  Pain Location: Rt knee Pain Descriptors / Indicators: Aching;Discomfort;Sore Pain Intervention(s): Limited activity within patient's tolerance;Monitored during session;Patient requesting pain meds-RN notified;Repositioned    Home Living Family/patient expects to be discharged to:: Private residence Living Arrangements: Children;Spouse/significant other Available Help at Discharge: Family Type of Home: House Home Access: Stairs to enter;Ramped entrance Entrance Stairs-Rails: Can reach both Entrance  Stairs-Number of Steps: 4 at back; 3 at front Home Layout: One level Home Equipment: Bedside commode;Hand held shower head;Walker - 2 wheels;Cane - single  point Additional Comments: ramp set up at back of house    Prior Function Level of Independence: Independent         Comments: pt retired and enjoys working around the [place]: Right    Extremity/Trunk Assessment   Upper Extremity Assessment Upper Extremity Assessment: Overall WFL for tasks assessed    Lower Extremity Assessment Lower Extremity Assessment: RLE deficits/detail RLE Deficits / Details: pt with good quad acitvaiton, no extensor lag with SLR RLE Sensation: WNL(pt reports parasthesia along anterior thigh) RLE Coordination: decreased gross motor(pt with decreased ankle dorsi/plantarflexion (1/5 with MMT))    Cervical / Trunk Assessment Cervical / Trunk Assessment: Other exceptions  Communication   Communication: No difficulties  Cognition Arousal/Alertness: Awake/alert Behavior During Therapy: WFL for tasks assessed/performed Overall Cognitive Status: Within Functional Limits for tasks assessed         General Comments      Exercises Total Joint Exercises Ankle Circles/Pumps: AROM;Both;15 reps;Supine(pt limited with Rt ankle) Quad Sets: PROM;Right;5 reps;Supine Short Arc Quad: AROM;Right;5 reps;Supine Heel Slides: AROM;Right;5 reps;Supine Hip ABduction/ADduction: AROM;Right;5 reps;Supine Straight Leg Raises: AROM;Right;Other reps (comment);Supine(2) Long Arc Quad: AROM;Right;5 reps;Seated Knee Flexion: AROM;AAROM;Right;5 reps;Seated   Assessment/Plan    PT Assessment Patient needs continued PT services  PT Problem List Decreased strength;Decreased range of motion;Decreased activity tolerance;Decreased balance;Decreased mobility;Decreased knowledge of use of DME;Obesity;Decreased knowledge of precautions       PT Treatment Interventions DME instruction;Gait training;Stair training;Functional mobility training;Therapeutic activities;Therapeutic exercise;Balance training;Patient/family education    PT Goals (Current  goals can be found in the Care Plan section)  Acute Rehab PT Goals Patient Stated Goal: go home PT Goal Formulation: With patient Time For Goal Achievement: 02/05/20 Potential to Achieve Goals: Good    Frequency 7X/week    AM-PAC PT "6 Clicks" Mobility  Outcome Measure Help needed turning from your back to your side while in a flat bed without using bedrails?: None Help needed moving from lying on your back to sitting on the side of a flat bed without using bedrails?: None Help needed moving to and from a bed to a chair (including a wheelchair)?: A Little Help needed standing up from a chair using your arms (e.g., wheelchair or bedside chair)?: A Little Help needed to walk in hospital room?: A Little Help needed climbing 3-5 steps with a railing? : A Little 6 Click Score: 20    End of Session Equipment Utilized During Treatment: Gait belt Activity Tolerance: Patient tolerated treatment well Patient left: in bed;with call bell/phone within reach Nurse Communication: Mobility status PT Visit Diagnosis: Muscle weakness (generalized) (M62.81);Difficulty in walking, not elsewhere classified (R26.2)    Time: 0370-9643(83 minutes spent communicating with MD and aquiring equipmen) PT Time Calculation (min) (ACUTE ONLY): 66 min   Charges:   PT Evaluation $PT Eval Moderate Complexity: 1 Mod PT Treatments $Gait Training: 8-22 mins $Therapeutic Exercise: 8-22 mins $Self Care/Home Management: 8-22        Verner Mould, DPT Physical Therapist with [facility] [phone]  01/30/2020 3:45 PM

## 2020-01-30 NOTE — Interval H&P Note (Signed)
History and Physical Interval Note:  01/30/2020 6:46 AM  [name] [name]  has presented today for surgery, with the diagnosis of RIGHT KNEE OSTEOARTHRITIIS.  The various methods of treatment have been discussed with the patient and family. After consideration of risks, benefits and other options for treatment, the patient has consented to  Procedure(s): TOTAL KNEE ARTHROPLASTY (Right) as a surgical intervention.  The patient's history has been reviewed, patient examined, no change in status, stable for surgery.  I have reviewed the patient's chart and labs.  Questions were answered to the patient's satisfaction.     Harvie Junior

## 2020-01-30 NOTE — Progress Notes (Signed)
Orthopedic Tech Progress Note Patient Details:  [name] [name] 1961/02/18 [id number] Patient was up and walking with PT. Knee immobilizer will be applied by PT.  Ortho Devices Type of Ortho Device: Knee Immobilizer, Long leg splint Ortho Device/Splint Location: RLE Ortho Device/Splint Interventions: Ordered   Post Interventions Instructions Provided: Care of device  Patient ID: [name], male   DOB: [birth year], 59 y.o.   MRN: [id number]   Ancil Linsey 01/30/2020, 1:54 PM

## 2020-01-30 NOTE — Anesthesia Procedure Notes (Signed)
Anesthesia Regional Block: Adductor canal block   Pre-Anesthetic Checklist: ,, timeout performed, Correct Patient, Correct Site, Correct Laterality, Correct Procedure, Correct Position, site marked, Risks and benefits discussed,  Surgical consent,  Pre-op evaluation,  At surgeon's request and post-op pain management  Laterality: Right and Lower  Prep: chloraprep       Needles:  Injection technique: Single-shot     Needle Length: 9cm  Needle Gauge: 22     Additional Needles: Arrow StimuQuik ECHO Echogenic Stimulating PNB Needle  Procedures:,,,, ultrasound used (permanent image in chart),,,,  Narrative:  Start time: 01/30/2020 7:36 AM End time: 01/30/2020 7:46 AM Injection made incrementally with aspirations every 5 mL.  Performed by: Personally  Anesthesiologist: Val Eagle, MD

## 2020-01-30 NOTE — Transfer of Care (Signed)
Immediate Anesthesia Transfer of Care Note  Patient: [name] [name]  Procedure(s) Performed: Procedure(s): TOTAL KNEE ARTHROPLASTY (Right)  Patient Location: PACU  Anesthesia Type:General  Level of Consciousness: Alert, Awake, Oriented  Airway & Oxygen Therapy: Patient Spontanous Breathing  Post-op Assessment: Report given to RN  Post vital signs: Reviewed and stable  Last Vitals:  Vitals:   01/30/20 0952 01/30/20 1000  BP: 122/63 (!) 144/85  Pulse: (!) 55 65  Resp: 18 11  Temp: (!) 35.9 C   SpO2: 92% 92%    Complications: No apparent anesthesia complications

## 2020-01-30 NOTE — Op Note (Addendum)
PATIENT ID:      [name] [name]  MRN:     [id number] DOB/AGE:    12/03/1960 / 59 y.o.       OPERATIVE REPORT   DATE OF PROCEDURE:  01/30/2020      PREOPERATIVE DIAGNOSIS:   RIGHT KNEE OSTEOARTHRITIIS      Estimated body mass index is 39.8 kg/m as calculated from the following:   Height as of this encounter: 6\' 4"  (1.93 m).   Weight as of this encounter: 148.3 kg.                                                       POSTOPERATIVE DIAGNOSIS:   Same                                                                  PROCEDURE:  Procedure(s): TOTAL KNEE ARTHROPLASTY Using DepuyAttune RP implants #8 Femur, #9Tibia, 10 mm Attune RP bearing, 41 Patella    SURGEON:  ASSISTANT:   Keirsten shepperdson PA-C   (Present and scrubbed throughout the case, critical for assistance with exposure, retraction, instrumentation, and closure.)        ANESTHESIA: general, 20cc Exparel, 50cc 0.25% Marcaine EBL: min cc FLUID REPLACEMENT: unk cc crystaloid TOURNIQUET: DRAINS: None TRANEXAMIC ACID: 1gm IV, 2gm topical COMPLICATIONS:  None         INDICATIONS FOR PROCEDURE: The patient has  RIGHT KNEE OSTEOARTHRITIIS, varus deformities, XR shows bone on bone arthritis, lateral subluxation of tibia. Patient has failed all conservative measures including anti-inflammatory medicines, narcotics, attempts at exercise and weight loss, cortisone injections and viscosupplementation.  Risks and benefits of surgery have been discussed, questions answered.   DESCRIPTION OF PROCEDURE: The patient identified by armband, received  IV antibiotics, in the holding area at [facility] Patient taken to the operating room, appropriate anesthetic monitors were attached, and general anesthesia was  induced. IV Tranexamic acid was given.Tourniquet applied high to the operative thigh. Lateral post and foot positioner applied to the table, the lower extremity was then prepped and draped in usual sterile fashion from the  toes to the tourniquet. Time-out procedure was performed.Kiersten Shepperdson PAC, was present and scrubbed throughout the case, critical for assistance with, positioning, exposure, retraction, instrumentation, and closure.The skin and subcutaneous tissue along the incision was injected with 20 cc of a mixture of Exparel and Marcaine solution, using a 20-gauge by 1-1/2 inch needle. We began the operation, with the knee flexed 130 degrees, by making the anterior midline incision starting at handbreadth above the patella going over the patella 1 cm medial to and 4 cm distal to the tibial tubercle. Small bleeders in the skin and the subcutaneous tissue identified and cauterized. Transverse retinaculum was incised and reflected medially and a medial parapatellar arthrotomy was accomplished. the patella was everted and theprepatellar fat pad resected. The superficial medial collateral ligament was then elevated from anterior to posterior along the proximal flare of the tibia and anterior half of the menisci resected. The knee was hyperflexed exposing bone on bone arthritis. Peripheral and notch osteophytes as well  as the cruciate ligaments were then resected. We continued to work our way around posteriorly along the proximal tibia, and externally rotated the tibia subluxing it out from underneath the femur. A McHale PCL retractor was placed through the notch and a lateral Hohmann retractor placed, and we then entered the proximal tibia in line with the Depuy starter drill in line with the axis of the tibia followed by an intramedullary guide rod and 0-degree posterior slope cutting guide. The tibial cutting guide, 4 degree posterior sloped, was pinned into place allowing resection of 2 mm of bone medially and 10 mm of bone laterally. Satisfied with the tibial resection, we then entered the distal femur 2 mm anterior to the PCL origin with the intramedullary guide rod and applied the distal femoral cutting guide set at 9  mm, with 5 degrees of valgus. This was pinned along the epicondylar axis. At this point, the distal femoral cut was accomplished without difficulty. We then sized for a #8 femoral component and pinned the guide in 3 degrees of external rotation. The chamfer cutting guide was pinned into place. The anterior, posterior, and chamfer cuts were accomplished without difficulty followed by the Attune RP box cutting guide and the box cut. We also removed posterior osteophytes from the posterior femoral condyles. The posterior capsule was injected with Exparel solution. The knee was brought into full extension. We checked our extension gap and fit a 10 mm bearing. Distracting in extension with a lamina spreader,  bleeders in the posterior capsule, Posterior medial and posterior lateral gutter were cauterized.  The transexamic acid-soaked sponge was then placed in the gap of the knee in extension. The knee was flexed 30. The posterior patella cut was accomplished with the 9.5 mm Attune cutting guide, sized for a 37mm dome, and the fixation pegs drilled.The knee was then once again hyperflexed exposing the proximal tibia. We sized for a # 9 tibial base plate, applied the smokestack and the conical reamer followed by the the Delta fin keel punch. We then hammered into place the Attune RP trial femoral component, drilled the lugs, inserted a  10 mm trial bearing, trial patellar button, and took the knee through range of motion from 0-130 degrees. Medial and lateral ligamentous stability was checked. No thumb pressure was required for patellar Tracking. The tourniquet was released @55  min. All trial components were removed, mating surfaces irrigated with pulse lavage, and dried with suction and sponges. 10 cc of the Exparel solution was applied to the cancellus bone of the patella distal femur and proximal tibia.  After waiting 30 seconds, the bony surfaces were again, dried with sponges. A double batch of DePuy HV cement was  mixed and applied to all bony metallic mating surfaces except for the posterior condyles of the femur itself. In order, we hammered into place the tibial tray and removed excess cement, the femoral component and removed excess cement. The final Attune RP bearing was inserted, and the knee brought to full extension with compression. The patellar button was clamped into place, and excess cement removed. The knee was held at 30 flexion with compression, while the cement cured. The wound was irrigated out with normal saline solution pulse lavage. The rest of the Exparel was injected into the parapatellar arthrotomy, subcutaneous tissues, and periosteal tissues. The parapatellar arthrotomy was closed with running #1 Vicryl suture. The subcutaneous tissue with 3-0 undyed Vicryl suture, and the skin with running 3-0 SQ vicryl. An Aquacil and Ace wrap were applied.  The patient was taken to recovery room without difficulty.   Alta Corning 01/30/2020, 9:50 AM  PATIENT ID:      [name] [name]  MRN:     [id number] DOB/AGE:    Dec 29, 1960 / 59 y.o.

## 2020-02-02 ENCOUNTER — Ambulatory Visit (INDEPENDENT_AMBULATORY_CARE_PROVIDER_SITE_OTHER): Payer: Medicare Other

## 2020-02-02 ENCOUNTER — Encounter: Payer: Self-pay | Admitting: *Deleted

## 2020-02-02 DIAGNOSIS — I5022 Chronic systolic (congestive) heart failure: Secondary | ICD-10-CM

## 2020-02-02 DIAGNOSIS — Z95810 Presence of automatic (implantable) cardiac defibrillator: Secondary | ICD-10-CM

## 2020-02-03 NOTE — Progress Notes (Signed)
EPIC Encounter for ICM Monitoring  Patient Name: [name] [name] is a 59 y.o. male Date: 02/03/2020 Primary Care Physican: [name], MD Primary Cardiologist:[name] Electrophysiologist:[name] Bi-V Pacing:80.6% 01/30/2020 OfficeWeight: 327lbs   Spoke with patient and he is doing well after total knee surgery last week.    OptivolThoracic impedancenormal but suggesting possible fluid accumulation during surgery dates.  Prescribed:Torsemide20 mg 3 tablets (60 mg total) twice a day. Potassium 2 tablets (40 mEq total) twice a day.  Labs: 01/20/2020 Creatinine 1.22, BUN 22, Potassium 4.6, Sodium 138, GFR >60 12/15/2019 Creatinine 1.05, BUN 11, Potassium 4.1 Sodium 142, GFR >60 12/04/2019 Creatinine 1.40, BUN 19, Potassium 4.6, Sodium 138, GFR 56->60 11/22/2019 Creatinine 1.02, BUN 13, Potassium 4.3, Sodium 138, GFR >60  A complete set of results can be found in Results Review.  Recommendations:No changes and encouraged to call if experiencing any fluid symptoms.  Follow-up plan: ICM clinic phone appointment on6/21/2021. 91 day device clinic remote transmission7/13/2021. Office visit with HF clinic PA/NP on 03/18/2020.  Copy of ICM check sent to Dr.[name]  3 month ICM trend: 02/02/2020    1 Year ICM trend:       [name], RN 02/03/2020 12:53 PM

## 2020-02-04 ENCOUNTER — Other Ambulatory Visit (HOSPITAL_COMMUNITY): Payer: Self-pay

## 2020-02-04 NOTE — Progress Notes (Signed)
Mr Bontempo was seen at home today and reported feeling generally well. He was in the middle of PT when I arrived so my visit was shorter than normal. His medications were verified and his pillbox was refilled. I ordered all necessary refills and they will be delivered by the end of the week via mail. I will follow up next week.   Jacqualine Code, EMT 02/04/20

## 2020-02-10 ENCOUNTER — Telehealth: Payer: Self-pay

## 2020-02-10 NOTE — Telephone Encounter (Signed)
Spoke with pt regarding 12/30/19 remote transmission.  Advised adjustments need to be made in  Clinic.  Scheduled pt for Device clinic appt on 02/12/20 10am at [address] office

## 2020-02-10 NOTE — Telephone Encounter (Signed)
-----   Message from [name], MD sent at 02/06/2020  8:46 PM EDT ----- Thx  lets turn of LVSR   thx

## 2020-02-11 ENCOUNTER — Telehealth (HOSPITAL_COMMUNITY): Payer: Self-pay

## 2020-02-11 NOTE — Telephone Encounter (Signed)
I called Mr Eskridge to see if he was available for an appointment today. He stated he had just finished with PT and wasn't feeling up to it. I asked if I could come tomorrow and he advised that he had an appointment with at Dr Odessa Fleming office. Given his multiple medical appointments this week I asked if he would rather me come next week and he answered in the affirmative. I will call next week for an appointment.   Jacqualine Code, EMT 02/11/20

## 2020-02-12 ENCOUNTER — Other Ambulatory Visit: Payer: Self-pay

## 2020-02-12 ENCOUNTER — Ambulatory Visit (INDEPENDENT_AMBULATORY_CARE_PROVIDER_SITE_OTHER): Payer: Medicare Other | Admitting: Emergency Medicine

## 2020-02-12 DIAGNOSIS — Z95810 Presence of automatic (implantable) cardiac defibrillator: Secondary | ICD-10-CM | POA: Diagnosis not present

## 2020-02-12 DIAGNOSIS — I472 Ventricular tachycardia, unspecified: Secondary | ICD-10-CM

## 2020-02-12 DIAGNOSIS — I4729 Other ventricular tachycardia: Secondary | ICD-10-CM

## 2020-02-17 LAB — CUP PACEART INCLINIC DEVICE CHECK
Battery Remaining Longevity: 18 mo
Battery Voltage: 2.91 V
Brady Statistic AP VP Percent: 1.44 %
Brady Statistic AP VS Percent: 13.55 %
Brady Statistic AS VP Percent: 83.1 %
Brady Statistic AS VS Percent: 1.9 %
Brady Statistic RA Percent Paced: 14.69 %
Brady Statistic RV Percent Paced: 83.74 %
Date Time Interrogation Session: 20210527102400
HighPow Impedance: 70 Ohm
Implantable Lead Implant Date: 20160727
Implantable Lead Implant Date: 20160727
Implantable Lead Implant Date: 20160727
Implantable Lead Location: 753858
Implantable Lead Location: 753859
Implantable Lead Location: 753860
Implantable Lead Model: 4598
Implantable Lead Model: 5076
Implantable Lead Model: 6935
Implantable Pulse Generator Implant Date: 20160727
Lead Channel Impedance Value: 342 Ohm
Lead Channel Impedance Value: 342 Ohm
Lead Channel Impedance Value: 342 Ohm
Lead Channel Impedance Value: 399 Ohm
Lead Channel Impedance Value: 418 Ohm
Lead Channel Impedance Value: 418 Ohm
Lead Channel Impedance Value: 475 Ohm
Lead Channel Impedance Value: 513 Ohm
Lead Channel Impedance Value: 551 Ohm
Lead Channel Impedance Value: 551 Ohm
Lead Channel Impedance Value: 589 Ohm
Lead Channel Impedance Value: 589 Ohm
Lead Channel Impedance Value: 608 Ohm
Lead Channel Pacing Threshold Amplitude: 0.5 V
Lead Channel Pacing Threshold Amplitude: 0.5 V
Lead Channel Pacing Threshold Amplitude: 1.25 V
Lead Channel Pacing Threshold Pulse Width: 0.4 ms
Lead Channel Pacing Threshold Pulse Width: 0.4 ms
Lead Channel Pacing Threshold Pulse Width: 0.6 ms
Lead Channel Sensing Intrinsic Amplitude: 11.875 mV
Lead Channel Sensing Intrinsic Amplitude: 4 mV
Lead Channel Sensing Intrinsic Amplitude: 8.5 mV
Lead Channel Setting Pacing Amplitude: 0.5 V
Lead Channel Setting Pacing Amplitude: 1.5 V
Lead Channel Setting Pacing Amplitude: 2.75 V
Lead Channel Setting Pacing Pulse Width: 0.03 ms
Lead Channel Setting Pacing Pulse Width: 0.6 ms
Lead Channel Setting Sensing Sensitivity: 0.3 mV

## 2020-02-17 NOTE — Progress Notes (Signed)
CRT-D device check in office. Thresholds and sensing consistent with previous device measurements. Lead impedance trends stable over time. No mode switches. 1 ventricular arrhythmia episode recorded which showed NSVT 14 beats in duration . Patient bi-ventricularly pacing 83.67% of the time. Device programmed with appropriate safety margins. Heart failure diagnostics reviewed and trends are stable for patient. During hospitalization V sense response was programmed on ,  per Dr Graciela Husbands V Sense response programmed off at this visit.  Estimated longevity 1 yr, 6 months.  Patient enrolled in remote follow up. Plan to check device remotely in 3 months and see in office in 6 months. Patient education completed including shock plan.

## 2020-02-19 ENCOUNTER — Other Ambulatory Visit (HOSPITAL_COMMUNITY): Payer: Self-pay

## 2020-02-19 NOTE — Progress Notes (Signed)
Paramedicine Encounter    Patient ID: [name], male    DOB: Jan 07, 1961, 59 y.o.   MRN: [id number]   Patient Care Team: [name], MD as PCP - General (Family Medicine) [name], [name], MD as PCP - Advanced Heart Failure (Cardiology) [name], MD as PCP - Electrophysiology (Cardiology) [name], MD as PCP - Cardiology (Cardiology) [name], MD (Family Medicine) [name], LCSW as Social Worker (Licensed Clinical Social Worker)  Patient Active Problem List   Diagnosis Date Noted  . Primary osteoarthritis of right knee 01/30/2020  . Hypokalemia 11/23/2019  . Primary osteoarthritis of right hip 11/03/2018  . VT (ventricular tachycardia) (HCC) 05/30/2017  . AKI (acute kidney injury) (HCC)   . Obesity 12/25/2016  . OSA (obstructive sleep apnea) 12/25/2016  . Hyperglycemia 12/25/2016  . Acute respiratory failure with hypoxia (HCC) 12/25/2016  . Chronic anticoagulation 06/27/2016  . Encounter for therapeutic drug monitoring 04/19/2015  . S/P ICD (internal cardiac defibrillator) procedure Medtronic CRT ICD 04/15/2015  . Atrial fibrillation with RVR - Paroxysmal 04/08/2015  . NICM 04/06/2015  . Troponin level elevated 04/06/2015  . Rectal bleeding 10/26/2011  . Hydrocele, left 05/24/2011  . Tobacco use disorder- 1/2 pk a day   . Alcohol abuse   . Obesity 09/27/2008  . CAD- minor CAD 2009 and 2016 09/27/2008  . VENTRICULAR TACHYCARDIA 09/27/2008  . Essential hypertension 06/19/2008  . Chronic systolic heart failure (HCC) 06/19/2008    Current Outpatient Medications:  .  albuterol (VENTOLIN HFA) 108 (90 Base) MCG/ACT inhaler, Inhale 1 puff into the lungs every 6 (six) hours as needed for wheezing or shortness of breath., Disp: 18 g, Rfl: 3 .  amiodarone (PACERONE) 200 MG tablet, Take 1 tablet (200 mg total) by mouth daily., Disp: 90 tablet, Rfl: 3 .  Ascorbic Acid (VITAMIN C) 100 MG tablet, Take 100 mg by mouth daily., Disp: , Rfl:   .  atorvastatin (LIPITOR) 80 MG tablet, Take 80 mg by mouth every evening., Disp: , Rfl:  .  mirtazapine (REMERON) 30 MG tablet, Take 0.5 tablets (15 mg total) by mouth at bedtime., Disp: 90 tablet, Rfl: 1 .  oxyCODONE-acetaminophen (PERCOCET/ROXICET) 5-325 MG tablet, Take 1-2 tablets by mouth every 6 (six) hours as needed for severe pain., Disp: 40 tablet, Rfl: 0 .  potassium chloride SA (KLOR-CON) 20 MEQ tablet, Take 2 tablets (40 mEq total) by mouth 2 (two) times daily., Disp: 240 tablet, Rfl: 3 .  rivaroxaban (XARELTO) 20 MG TABS tablet, Take 1 tablet (20 mg total) by mouth daily with supper., Disp: 30 tablet, Rfl: 6 .  sacubitril-valsartan (ENTRESTO) 24-26 MG, Take 1 tablet by mouth 2 (two) times daily., Disp: 60 tablet, Rfl: 3 .  sotalol (BETAPACE) 80 MG tablet, Take 1 tablet (80 mg total) by mouth every 12 (twelve) hours., Disp: 60 tablet, Rfl: 6 .  spironolactone (ALDACTONE) 25 MG tablet, Take 25 mg by mouth daily., Disp: , Rfl:  .  tiZANidine (ZANAFLEX) 2 MG tablet, Take 1 tablet (2 mg total) by mouth every 8 (eight) hours as needed for muscle spasms., Disp: 40 tablet, Rfl: 0 .  torsemide (DEMADEX) 20 MG tablet, Take 3 tablets (60 mg total) by mouth every morning AND 2 tablets (40 mg total) every evening., Disp: 300 tablet, Rfl: 3 .  docusate sodium (COLACE) 100 MG capsule, Take 1 capsule (100 mg total) by mouth 2 (two) times daily. (Patient not taking: Reported on 02/19/2020), Disp: 30 capsule, Rfl: 0 .  triamcinolone cream (KENALOG) 0.1 %, Apply 1 application topically 2 (two) times daily., Disp: 80 g, Rfl: 1 No Known Allergies    Social History   Socioeconomic History  . Marital status: Divorced    Spouse name: Not on file  . Number of children: 2  . Years of education: Not on file  . Highest education level: Not on file  Occupational History  . Occupation: Copy  . Occupation: disabled, odd jobs  Tobacco Use  . Smoking status: Former Smoker    Packs/day: 0.50     Years: 4.00    Pack years: 2.00    Types: Cigarettes    Quit date: 10/29/2005    Years since quitting: 14.3  . Smokeless tobacco: Never Used  . Tobacco comment: about a pack a week. social  Substance and Sexual Activity  . Alcohol use: Yes    Alcohol/week: 42.0 standard drinks    Types: 42 Cans of beer per week    Comment: 24 a week  . Drug use: Yes    Frequency: 7.0 times per week    Types: Marijuana    Comment: daily  . Sexual activity: Yes    Partners: Female  Other Topics Concern  . Not on file  Social History Narrative   ** Merged History Encounter **       Social Determinants of Health   Financial Resource Strain:   . Difficulty of Paying Living Expenses:   Food Insecurity:   . Worried About Programme researcher, broadcasting/film/video in the Last Year:   . Barista in the Last Year:   Transportation Needs: No Transportation Needs  . Lack of Transportation (Medical): No  . Lack of Transportation (Non-Medical): No  Physical Activity: Sufficiently Active  . Days of Exercise per Week: 7 days  . Minutes of Exercise per Session: 30 min  Stress:   . Feeling of Stress :   Social Connections:   . Frequency of Communication with Friends and Family:   . Frequency of Social Gatherings with Friends and Family:   . Attends Religious Services:   . Active Member of Clubs or Organizations:   . Attends Banker Meetings:   Marland Kitchen Marital Status:   Intimate Partner Violence:   . Fear of Current or Ex-Partner:   . Emotionally Abused:   Marland Kitchen Physically Abused:   . Sexually Abused:     Physical Exam Cardiovascular:     Rate and Rhythm: Normal rate and regular rhythm.     Pulses: Normal pulses.  Pulmonary:     Effort: Pulmonary effort is normal.     Breath sounds: Normal breath sounds.  Musculoskeletal:        General: Normal range of motion.     Right lower leg: No edema.     Left lower leg: No edema.  Skin:    General: Skin is warm and dry.     Capillary Refill: Capillary refill  takes less than 2 seconds.  Neurological:     Mental Status: He is alert and oriented to person, place, and time.  Psychiatric:        Mood and Affect: Mood normal.         Future Appointments  Date Time Provider Department Center  03/08/2020 11:40 AM [facility] DEVICE REMOTES [facility]  03/18/2020 10:30 AM [facility] PA/NP [facility] None  03/30/2020  7:20 AM [facility] DEVICE REMOTES [facility]  06/29/2020  7:20 AM [facility] DEVICE REMOTES [facility]  BP (!) 107/54 (BP Location: Left Arm, Patient Position: Sitting, Cuff Size: Normal)   Pulse 66   Resp 18   Wt (!) 324 lb (147 kg)   SpO2 97%   BMI 39.44 kg/m   Weight yesterday- 324 lb Last visit weight- 327 lb  Mr Knoth was seen at home today and reported feeling well. He denied chest pain, SOB, headache, dizziness, orthopnea, fever or cough since our last visit. He stated he has been compliant with his medications over the past week and his weight has been stable. His medications were verified and his pillbox was refilled. Today we went over how he logs into his online pharmacy and orders his medication refills. He was understanding however nothing needed to be ordered at this time. I will follow up next week.   Jacquiline Doe, EMT 02/19/20  ACTION: Home visit completed Next visit planned for 1 week

## 2020-02-25 ENCOUNTER — Other Ambulatory Visit (HOSPITAL_COMMUNITY): Payer: Self-pay

## 2020-02-25 ENCOUNTER — Other Ambulatory Visit: Payer: Self-pay

## 2020-02-25 ENCOUNTER — Ambulatory Visit (HOSPITAL_COMMUNITY)
Admission: RE | Admit: 2020-02-25 | Discharge: 2020-02-25 | Disposition: A | Discharge status: Home / Self Care | Payer: Medicare Other | Source: Ambulatory Visit | Attending: Cardiology | Admitting: Cardiology

## 2020-02-25 VITALS — BP 142/76 | HR 66 | Wt 325.6 lb

## 2020-02-25 DIAGNOSIS — I509 Heart failure, unspecified: Secondary | ICD-10-CM | POA: Diagnosis present

## 2020-02-25 DIAGNOSIS — I491 Atrial premature depolarization: Secondary | ICD-10-CM | POA: Diagnosis not present

## 2020-02-25 DIAGNOSIS — R9431 Abnormal electrocardiogram [ECG] [EKG]: Secondary | ICD-10-CM | POA: Diagnosis not present

## 2020-02-25 DIAGNOSIS — I4891 Unspecified atrial fibrillation: Secondary | ICD-10-CM

## 2020-02-25 NOTE — Progress Notes (Signed)
Paramedicine Encounter    Patient ID: [name] [name], male    DOB: June 05, 1961, 59 y.o.   MRN: [id number]   Patient Care Team: [name], MD as PCP - General (Family Medicine) [name], [name], MD as PCP - Advanced Heart Failure (Cardiology) [name], MD as PCP - Electrophysiology (Cardiology) [name], MD as PCP - Cardiology (Cardiology) [name], MD (Family Medicine) [name], LCSW as Social Worker (Licensed Clinical Social Worker)  Patient Active Problem List   Diagnosis Date Noted   Primary osteoarthritis of right knee 01/30/2020   Hypokalemia 11/23/2019   Primary osteoarthritis of right hip 11/03/2018   VT (ventricular tachycardia) (HCC) 05/30/2017   AKI (acute kidney injury) (HCC)    Obesity 12/25/2016   OSA (obstructive sleep apnea) 12/25/2016   Hyperglycemia 12/25/2016   Acute respiratory failure with hypoxia (HCC) 12/25/2016   Chronic anticoagulation 06/27/2016   Encounter for therapeutic drug monitoring 04/19/2015   S/P ICD (internal cardiac defibrillator) procedure Medtronic CRT ICD 04/15/2015   Atrial fibrillation with RVR - Paroxysmal 04/08/2015   NICM 04/06/2015   Troponin level elevated 04/06/2015   Rectal bleeding 10/26/2011   Hydrocele, left 05/24/2011   Tobacco use disorder- 1/2 pk a day    Alcohol abuse    Obesity 09/27/2008   CAD- minor CAD 2009 and 2016 09/27/2008   VENTRICULAR TACHYCARDIA 09/27/2008   Essential hypertension 06/19/2008   Chronic systolic heart failure (HCC) 06/19/2008    Current Outpatient Medications:    albuterol (VENTOLIN HFA) 108 (90 Base) MCG/ACT inhaler, Inhale 1 puff into the lungs every 6 (six) hours as needed for wheezing or shortness of breath., Disp: 18 g, Rfl: 3   amiodarone (PACERONE) 200 MG tablet, Take 1 tablet (200 mg total) by mouth daily., Disp: 90 tablet, Rfl: 3   Ascorbic Acid (VITAMIN C) 100 MG tablet, Take 100 mg by mouth daily., Disp: , Rfl:     atorvastatin (LIPITOR) 80 MG tablet, Take 80 mg by mouth every evening., Disp: , Rfl:    mirtazapine (REMERON) 30 MG tablet, Take 0.5 tablets (15 mg total) by mouth at bedtime., Disp: 90 tablet, Rfl: 1   potassium chloride SA (KLOR-CON) 20 MEQ tablet, Take 2 tablets (40 mEq total) by mouth 2 (two) times daily., Disp: 240 tablet, Rfl: 3   rivaroxaban (XARELTO) 20 MG TABS tablet, Take 1 tablet (20 mg total) by mouth daily with supper., Disp: 30 tablet, Rfl: 6   sacubitril-valsartan (ENTRESTO) 24-26 MG, Take 1 tablet by mouth 2 (two) times daily., Disp: 60 tablet, Rfl: 3   sotalol (BETAPACE) 80 MG tablet, Take 1 tablet (80 mg total) by mouth every 12 (twelve) hours., Disp: 60 tablet, Rfl: 6   spironolactone (ALDACTONE) 25 MG tablet, Take 25 mg by mouth daily., Disp: , Rfl:    tiZANidine (ZANAFLEX) 2 MG tablet, Take 1 tablet (2 mg total) by mouth every 8 (eight) hours as needed for muscle spasms., Disp: 40 tablet, Rfl: 0   torsemide (DEMADEX) 20 MG tablet, Take 3 tablets (60 mg total) by mouth every morning AND 2 tablets (40 mg total) every evening., Disp: 300 tablet, Rfl: 3   docusate sodium (COLACE) 100 MG capsule, Take 1 capsule (100 mg total) by mouth 2 (two) times daily. (Patient not taking: Reported on 02/19/2020), Disp: 30 capsule, Rfl: 0   oxyCODONE-acetaminophen (PERCOCET/ROXICET) 5-325 MG tablet, Take 1-2 tablets by mouth every 6 (six) hours as needed for severe pain. (Patient not taking: Reported on 02/25/2020),  Disp: 40 tablet, Rfl: 0   triamcinolone cream (KENALOG) 0.1 %, Apply 1 application topically 2 (two) times daily., Disp: 80 g, Rfl: 1 No Known Allergies    Social History   Socioeconomic History   Marital status: Divorced    Spouse name: Not on file   Number of children: 2   Years of education: Not on file   Highest education level: Not on file  Occupational History   Occupation: scrap metal   Occupation: disabled, odd jobs  Tobacco Use   Smoking status:  Former Smoker    Packs/day: 0.50    Years: 4.00    Pack years: 2.00    Types: Cigarettes    Quit date: 10/29/2005    Years since quitting: 14.3   Smokeless tobacco: Never Used   Tobacco comment: about a pack a week. social  Substance and Sexual Activity   Alcohol use: Yes    Alcohol/week: 42.0 standard drinks    Types: 42 Cans of beer per week    Comment: 24 a week   Drug use: Yes    Frequency: 7.0 times per week    Types: Marijuana    Comment: daily   Sexual activity: Yes    Partners: Female  Other Topics Concern   Not on file  Social History Narrative   ** Merged History Encounter **       Social Determinants of Health   Financial Resource Strain:    Difficulty of Paying Living Expenses:   Food Insecurity:    Worried About Programme researcher, broadcasting/film/video in the Last Year:    Barista in the Last Year:   Transportation Needs: No Transportation Needs   Lack of Transportation (Medical): No   Lack of Transportation (Non-Medical): No  Physical Activity: Sufficiently Active   Days of Exercise per Week: 7 days   Minutes of Exercise per Session: 30 min  Stress:    Feeling of Stress :   Social Connections:    Frequency of Communication with Friends and Family:    Frequency of Social Gatherings with Friends and Family:    Attends Religious Services:    Active Member of Clubs or Organizations:    Attends Banker Meetings:    Marital Status:   Intimate Partner Violence:    Fear of Current or Ex-Partner:    Emotionally Abused:    Physically Abused:    Sexually Abused:     Physical Exam Cardiovascular:     Rate and Rhythm: Normal rate. Rhythm irregular.     Pulses: Normal pulses.  Pulmonary:     Effort: Pulmonary effort is normal.     Breath sounds: Normal breath sounds.  Musculoskeletal:        General: Normal range of motion.     Right lower leg: No edema.     Left lower leg: No edema.  Skin:    General: Skin is warm and dry.      Capillary Refill: Capillary refill takes less than 2 seconds.  Neurological:     Mental Status: He is alert and oriented to person, place, and time.  Psychiatric:        Mood and Affect: Mood normal.         Future Appointments  Date Time Provider Department Center  03/08/2020 11:40 AM [facility] DEVICE REMOTES [facility]  03/18/2020 10:30 AM [facility] PA/NP [facility] None  03/30/2020  7:20 AM [facility] DEVICE REMOTES [facility]  06/29/2020  7:20 AM  [facility] DEVICE [facility]OTES [facility]    BP 97/66 (BP Location: Left Arm, Patient Position: Sitting, Cuff Size: Normal)    Pulse 67    Resp 16    Wt (!) 324 lb (147 kg)    SpO2 97%    BMI 39.44 kg/m   Weight yesterday- 323 lb Last visit weight- 324 lb  [name] [name] was seen at home today and reported feeling well. He denied chest pain, SOB, headache, dizziness, orthopnea, fever or cough since our last visit. He stated he has been compliant with his medications over the past week and his weight has been stable. Upon obtaining his vital signs I noted his radial pulse to be irregular. I places him on a 12 lead ECG and was able to see a paced rhythm but with irregular QRS complexes. [name] Sookdeo has a history of atrial fibrillation so I contacted the clinic with my suspicion that he was back in it. He was advised to come in for an ECG at the HF clinic but no changes were immediately made. His medications were verified and he refilled his pillbox. I will follow up next week or sooner if necessary.  Jacquiline Doe, EMT 02/25/20  ACTION: Home visit completed Next visit planned for 1 week

## 2020-02-25 NOTE — Progress Notes (Addendum)
Patient present to office today for EKG [name] [name] with paramedicine called during patients home visit today to report abnormal EKG (afib) HR 67 irregular b/p 97/66 no s/x  ekg done in patients home reviewed by Dr Shirlee Latch and he was not convinced it was true afib (?pvc's) Patient should return to clinic for ekg   Per Dr Shirlee Latch NSR   No changes are needed at this time

## 2020-03-03 ENCOUNTER — Other Ambulatory Visit: Payer: Self-pay

## 2020-03-03 NOTE — Telephone Encounter (Signed)
This is a CHF pt. Dr. Shirlee Latch. Please address

## 2020-03-04 ENCOUNTER — Telehealth (HOSPITAL_COMMUNITY): Payer: Self-pay

## 2020-03-04 NOTE — Telephone Encounter (Signed)
I called [name] to see if he would be available for a visit tomorrow. He stated he would be home all day and agreed to meet me around 11:30.   Jacqualine Code, EMT 03/04/20

## 2020-03-05 ENCOUNTER — Other Ambulatory Visit (HOSPITAL_COMMUNITY): Payer: Self-pay

## 2020-03-05 NOTE — Progress Notes (Signed)
Paramedicine Encounter    Patient ID: [name] [name], male    DOB: 28-May-1961, 59 y.o.   MRN: [id number]   Patient Care Team: [name], MD as PCP - General (Family Medicine) [name], [name], MD as PCP - Advanced Heart Failure (Cardiology) [name], MD as PCP - Electrophysiology (Cardiology) [name], MD as PCP - Cardiology (Cardiology) [name], MD (Family Medicine) [name], LCSW as Social Worker (Licensed Clinical Social Worker)  Patient Active Problem List   Diagnosis Date Noted  . Primary osteoarthritis of right knee 01/30/2020  . Hypokalemia 11/23/2019  . Primary osteoarthritis of right hip 11/03/2018  . VT (ventricular tachycardia) (HCC) 05/30/2017  . AKI (acute kidney injury) (HCC)   . Obesity 12/25/2016  . OSA (obstructive sleep apnea) 12/25/2016  . Hyperglycemia 12/25/2016  . Acute respiratory failure with hypoxia (HCC) 12/25/2016  . Chronic anticoagulation 06/27/2016  . Encounter for therapeutic drug monitoring 04/19/2015  . S/P ICD (internal cardiac defibrillator) procedure Medtronic CRT ICD 04/15/2015  . Atrial fibrillation with RVR - Paroxysmal 04/08/2015  . NICM 04/06/2015  . Troponin level elevated 04/06/2015  . Rectal bleeding 10/26/2011  . Hydrocele, left 05/24/2011  . Tobacco use disorder- 1/2 pk a day   . Alcohol abuse   . Obesity 09/27/2008  . CAD- minor CAD 2009 and 2016 09/27/2008  . VENTRICULAR TACHYCARDIA 09/27/2008  . Essential hypertension 06/19/2008  . Chronic systolic heart failure (HCC) 06/19/2008    Current Outpatient Medications:  .  albuterol (VENTOLIN HFA) 108 (90 Base) MCG/ACT inhaler, Inhale 1 puff into the lungs every 6 (six) hours as needed for wheezing or shortness of breath., Disp: 18 g, Rfl: 3 .  amiodarone (PACERONE) 200 MG tablet, Take 1 tablet (200 mg total) by mouth daily., Disp: 90 tablet, Rfl: 3 .  Ascorbic Acid (VITAMIN C) 100 MG tablet, Take 100 mg by mouth daily., Disp: , Rfl:   .  atorvastatin (LIPITOR) 80 MG tablet, Take 80 mg by mouth every evening., Disp: , Rfl:  .  mirtazapine (REMERON) 30 MG tablet, Take 0.5 tablets (15 mg total) by mouth at bedtime., Disp: 90 tablet, Rfl: 1 .  potassium chloride SA (KLOR-CON) 20 MEQ tablet, Take 2 tablets (40 mEq total) by mouth 2 (two) times daily., Disp: 240 tablet, Rfl: 3 .  rivaroxaban (XARELTO) 20 MG TABS tablet, Take 1 tablet (20 mg total) by mouth daily with supper., Disp: 30 tablet, Rfl: 6 .  sacubitril-valsartan (ENTRESTO) 24-26 MG, Take 1 tablet by mouth 2 (two) times daily., Disp: 60 tablet, Rfl: 3 .  sotalol (BETAPACE) 80 MG tablet, Take 1 tablet (80 mg total) by mouth every 12 (twelve) hours., Disp: 60 tablet, Rfl: 6 .  spironolactone (ALDACTONE) 25 MG tablet, Take 25 mg by mouth daily., Disp: , Rfl:  .  tiZANidine (ZANAFLEX) 2 MG tablet, Take 1 tablet (2 mg total) by mouth every 8 (eight) hours as needed for muscle spasms., Disp: 40 tablet, Rfl: 0 .  torsemide (DEMADEX) 20 MG tablet, Take 3 tablets (60 mg total) by mouth every morning AND 2 tablets (40 mg total) every evening., Disp: 300 tablet, Rfl: 3 .  triamcinolone cream (KENALOG) 0.1 %, Apply 1 application topically 2 (two) times daily., Disp: 80 g, Rfl: 1 .  docusate sodium (COLACE) 100 MG capsule, Take 1 capsule (100 mg total) by mouth 2 (two) times daily. (Patient not taking: Reported on 02/19/2020), Disp: 30 capsule, Rfl: 0 .  oxyCODONE-acetaminophen (PERCOCET/ROXICET) 5-325 MG tablet,  Take 1-2 tablets by mouth every 6 (six) hours as needed for severe pain. (Patient not taking: Reported on 02/25/2020), Disp: 40 tablet, Rfl: 0 No Known Allergies    Social History   Socioeconomic History  . Marital status: Divorced    Spouse name: Not on file  . Number of children: 2  . Years of education: Not on file  . Highest education level: Not on file  Occupational History  . Occupation: Copy  . Occupation: disabled, odd jobs  Tobacco Use  . Smoking status:  Former Smoker    Packs/day: 0.50    Years: 4.00    Pack years: 2.00    Types: Cigarettes    Quit date: 10/29/2005    Years since quitting: 14.3  . Smokeless tobacco: Never Used  . Tobacco comment: about a pack a week. social  Vaping Use  . Vaping Use: Never used  Substance and Sexual Activity  . Alcohol use: Yes    Alcohol/week: 42.0 standard drinks    Types: 42 Cans of beer per week    Comment: 24 a week  . Drug use: Yes    Frequency: 7.0 times per week    Types: Marijuana    Comment: daily  . Sexual activity: Yes    Partners: Female  Other Topics Concern  . Not on file  Social History Narrative   ** Merged History Encounter **       Social Determinants of Health   Financial Resource Strain:   . Difficulty of Paying Living Expenses:   Food Insecurity:   . Worried About Programme researcher, broadcasting/film/video in the Last Year:   . Barista in the Last Year:   Transportation Needs: No Transportation Needs  . Lack of Transportation (Medical): No  . Lack of Transportation (Non-Medical): No  Physical Activity: Sufficiently Active  . Days of Exercise per Week: 7 days  . Minutes of Exercise per Session: 30 min  Stress:   . Feeling of Stress :   Social Connections:   . Frequency of Communication with Friends and Family:   . Frequency of Social Gatherings with Friends and Family:   . Attends Religious Services:   . Active Member of Clubs or Organizations:   . Attends Banker Meetings:   Marland Kitchen Marital Status:   Intimate Partner Violence:   . Fear of Current or Ex-Partner:   . Emotionally Abused:   Marland Kitchen Physically Abused:   . Sexually Abused:     Physical Exam Cardiovascular:     Rate and Rhythm: Normal rate and regular rhythm.     Pulses: Normal pulses.  Pulmonary:     Effort: Pulmonary effort is normal.     Breath sounds: Normal breath sounds.  Musculoskeletal:        General: Normal range of motion.     Right lower leg: No edema.     Left lower leg: No edema.   Skin:    General: Skin is warm and dry.     Capillary Refill: Capillary refill takes less than 2 seconds.  Neurological:     Mental Status: He is alert and oriented to person, place, and time.  Psychiatric:        Mood and Affect: Mood normal.         Future Appointments  Date Time Provider Department Center  03/08/2020 11:40 AM [facility] DEVICE REMOTES [facility]  03/18/2020 10:30 AM [facility] PA/NP [facility] None  03/30/2020  7:20 AM  [facility] DEVICE REMOTES [facility]  06/29/2020  7:20 AM [facility] DEVICE REMOTES [facility]    BP (!) 100/58 (BP Location: Left Arm, Patient Position: Sitting, Cuff Size: Large)   Pulse 69   Resp 16   Wt (!) 325 lb (147.4 kg)   SpO2 98%   BMI 39.56 kg/m   Weight yesterday- 324 lb Last visit weight- 325 lb  Mr Cix was seen at home today and rpeorted feeling well. He denied chest pain, SOB, headache, dizziness, orthopnea, fever or cough since our last visit. He rpeorted being compliant with his medications and his weight has been stable. His medications were verified and he refilled his own pillbox without assistance. Today I assisted him in ordering refills of his prescriptions through his inline pharmacy. I will follow up next week and begin discussing discharge due to his progress.   Jacquiline Doe, EMT 03/05/20  ACTION: Home visit completed Next visit planned for 1 week

## 2020-03-08 ENCOUNTER — Ambulatory Visit (INDEPENDENT_AMBULATORY_CARE_PROVIDER_SITE_OTHER): Payer: Medicare Other

## 2020-03-08 DIAGNOSIS — I5022 Chronic systolic (congestive) heart failure: Secondary | ICD-10-CM | POA: Diagnosis not present

## 2020-03-08 DIAGNOSIS — Z95810 Presence of automatic (implantable) cardiac defibrillator: Secondary | ICD-10-CM

## 2020-03-08 MED ORDER — ATORVASTATIN CALCIUM 80 MG PO TABS: 80.0000 mg | ORAL_TABLET | Freq: Every evening | ORAL | 3 refills | Status: DC

## 2020-03-10 NOTE — Progress Notes (Signed)
EPIC Encounter for ICM Monitoring  Patient Name: [name] [name] is a 59 y.o. male Date: 03/10/2020 Primary Care Physican: [name], MD Primary Cardiologist:[name] Electrophysiologist:[name] Bi-V Pacing:83.1% 03/10/2020 Weight: 318lbs   Spoke with patient and reports feeling well at this time.  Denies fluid symptoms.    OptivolThoracic impedancenormal.  Prescribed:Torsemide20 mg 3 tablets (60 mg total) twice a day. Potassium 2 tablets (40 mEq total) twice a day.  Labs: 01/20/2020 Creatinine 1.22, BUN 22, Potassium 4.6, Sodium 138, GFR >60 12/15/2019 Creatinine 1.05, BUN 11, Potassium 4.1 Sodium 142, GFR >60 12/04/2019 Creatinine 1.40, BUN 19, Potassium 4.6, Sodium 138, GFR 56->60 11/22/2019 Creatinine 1.02, BUN 13, Potassium 4.3, Sodium 138, GFR >60  A complete set of results can be found in Results Review.  Recommendations:No changes and encouraged to call if experiencing any fluid symptoms.  Follow-up plan: ICM clinic phone appointment on7/26/2021. 91 day device clinic remote transmission7/13/2021. Next office visit with Dr Shirlee Latch on 05/10/2020.  Copy of ICM check sent to Dr.[name]  3 month ICM trend: 03/08/2020    1 Year ICM trend:       [name], RN 03/10/2020 11:05 AM

## 2020-03-11 ENCOUNTER — Other Ambulatory Visit (HOSPITAL_COMMUNITY): Payer: Self-pay

## 2020-03-11 NOTE — Progress Notes (Signed)
Paramedicine Encounter    Patient ID: [name] [name], male    DOB: Jul 14, 1961, 59 y.o.   MRN: [id number]   Patient Care Team: [name], MD as PCP - General (Family Medicine) [name], [name], MD as PCP - Advanced Heart Failure (Cardiology) [name], MD as PCP - Electrophysiology (Cardiology) [name], MD as PCP - Cardiology (Cardiology) [name], MD (Family Medicine) [name], LCSW as Social Worker (Licensed Clinical Social Worker)  Patient Active Problem List   Diagnosis Date Noted   Primary osteoarthritis of right knee 01/30/2020   Hypokalemia 11/23/2019   Primary osteoarthritis of right hip 11/03/2018   VT (ventricular tachycardia) (HCC) 05/30/2017   AKI (acute kidney injury) (HCC)    Obesity 12/25/2016   OSA (obstructive sleep apnea) 12/25/2016   Hyperglycemia 12/25/2016   Acute respiratory failure with hypoxia (HCC) 12/25/2016   Chronic anticoagulation 06/27/2016   Encounter for therapeutic drug monitoring 04/19/2015   S/P ICD (internal cardiac defibrillator) procedure Medtronic CRT ICD 04/15/2015   Atrial fibrillation with RVR - Paroxysmal 04/08/2015   NICM 04/06/2015   Troponin level elevated 04/06/2015   Rectal bleeding 10/26/2011   Hydrocele, left 05/24/2011   Tobacco use disorder- 1/2 pk a day    Alcohol abuse    Obesity 09/27/2008   CAD- minor CAD 2009 and 2016 09/27/2008   VENTRICULAR TACHYCARDIA 09/27/2008   Essential hypertension 06/19/2008   Chronic systolic heart failure (HCC) 06/19/2008    Current Outpatient Medications:    amiodarone (PACERONE) 200 MG tablet, Take 1 tablet (200 mg total) by mouth daily., Disp: 90 tablet, Rfl: 3   Ascorbic Acid (VITAMIN C) 100 MG tablet, Take 100 mg by mouth daily., Disp: , Rfl:    atorvastatin (LIPITOR) 80 MG tablet, Take 1 tablet (80 mg total) by mouth every evening., Disp: 180 tablet, Rfl: 3   mirtazapine (REMERON) 30 MG tablet, Take 0.5 tablets  (15 mg total) by mouth at bedtime., Disp: 90 tablet, Rfl: 1   potassium chloride SA (KLOR-CON) 20 MEQ tablet, Take 2 tablets (40 mEq total) by mouth 2 (two) times daily., Disp: 240 tablet, Rfl: 3   rivaroxaban (XARELTO) 20 MG TABS tablet, Take 1 tablet (20 mg total) by mouth daily with supper., Disp: 30 tablet, Rfl: 6   sacubitril-valsartan (ENTRESTO) 24-26 MG, Take 1 tablet by mouth 2 (two) times daily., Disp: 60 tablet, Rfl: 3   sotalol (BETAPACE) 80 MG tablet, Take 1 tablet (80 mg total) by mouth every 12 (twelve) hours., Disp: 60 tablet, Rfl: 6   spironolactone (ALDACTONE) 25 MG tablet, Take 25 mg by mouth daily., Disp: , Rfl:    torsemide (DEMADEX) 20 MG tablet, Take 3 tablets (60 mg total) by mouth every morning AND 2 tablets (40 mg total) every evening., Disp: 300 tablet, Rfl: 3   albuterol (VENTOLIN HFA) 108 (90 Base) MCG/ACT inhaler, Inhale 1 puff into the lungs every 6 (six) hours as needed for wheezing or shortness of breath., Disp: 18 g, Rfl: 3   docusate sodium (COLACE) 100 MG capsule, Take 1 capsule (100 mg total) by mouth 2 (two) times daily. (Patient not taking: Reported on 02/19/2020), Disp: 30 capsule, Rfl: 0   oxyCODONE-acetaminophen (PERCOCET/ROXICET) 5-325 MG tablet, Take 1-2 tablets by mouth every 6 (six) hours as needed for severe pain. (Patient not taking: Reported on 02/25/2020), Disp: 40 tablet, Rfl: 0   tiZANidine (ZANAFLEX) 2 MG tablet, Take 1 tablet (2 mg total) by mouth every 8 (eight) hours as  needed for muscle spasms. (Patient not taking: Reported on 03/11/2020), Disp: 40 tablet, Rfl: 0   triamcinolone cream (KENALOG) 0.1 %, Apply 1 application topically 2 (two) times daily., Disp: 80 g, Rfl: 1 No Known Allergies    Social History   Socioeconomic History   Marital status: Divorced    Spouse name: Not on file   Number of children: 2   Years of education: Not on file   Highest education level: Not on file  Occupational History   Occupation: scrap  metal   Occupation: disabled, odd jobs  Tobacco Use   Smoking status: Former Smoker    Packs/day: 0.50    Years: 4.00    Pack years: 2.00    Types: Cigarettes    Quit date: 10/29/2005    Years since quitting: 14.3   Smokeless tobacco: Never Used   Tobacco comment: about a pack a week. social  Vaping Use   Vaping Use: Never used  Substance and Sexual Activity   Alcohol use: Yes    Alcohol/week: 42.0 standard drinks    Types: 42 Cans of beer per week    Comment: 24 a week   Drug use: Yes    Frequency: 7.0 times per week    Types: Marijuana    Comment: daily   Sexual activity: Yes    Partners: Female  Other Topics Concern   Not on file  Social History Narrative   ** Merged History Encounter **       Social Determinants of Health   Financial Resource Strain:    Difficulty of Paying Living Expenses:   Food Insecurity:    Worried About Charity fundraiser in the Last Year:    Arboriculturist in the Last Year:   Transportation Needs: No Transportation Needs   Lack of Transportation (Medical): No   Lack of Transportation (Non-Medical): No  Physical Activity: Sufficiently Active   Days of Exercise per Week: 7 days   Minutes of Exercise per Session: 30 min  Stress:    Feeling of Stress :   Social Connections:    Frequency of Communication with Friends and Family:    Frequency of Social Gatherings with Friends and Family:    Attends Religious Services:    Active Member of Clubs or Organizations:    Attends Archivist Meetings:    Marital Status:   Intimate Partner Violence:    Fear of Current or Ex-Partner:    Emotionally Abused:    Physically Abused:    Sexually Abused:     Physical Exam Cardiovascular:     Rate and Rhythm: Normal rate and regular rhythm.     Pulses: Normal pulses.  Pulmonary:     Effort: Pulmonary effort is normal.     Breath sounds: Normal breath sounds.  Musculoskeletal:        General: Normal range of  motion.     Right lower leg: Edema present.     Left lower leg: No edema.  Skin:    General: Skin is warm and dry.     Capillary Refill: Capillary refill takes less than 2 seconds.  Neurological:     Mental Status: He is alert and oriented to person, place, and time.  Psychiatric:        Mood and Affect: Mood normal.         Future Appointments  Date Time Provider [place]  03/30/2020  7:20 AM [facility]ICE [facility]  04/12/2020  11:45 AM [facility]ICE REMOTES [facility]  05/10/2020  9:30 AM [facility] PA/NP [facility] None  06/29/2020  7:20 AM [facility] DEVICE REMOTES [facility]    BP 98/62 (BP Location: Left Arm, Patient Position: Sitting, Cuff Size: Normal)    Pulse 80    Resp 16    Wt (!) 323 lb (146.5 kg)    SpO2 96%    BMI 39.32 kg/m   Weight yesterday- 324 lb Last visit weight- 325 lb  Mr Apodaca was seen at home today and reported feeling well. He denied chest pain, SOB, headache, dizziness, orthopnea, fever or cough over the past week. He stated he has been compliant with is medications over the past week and his weight has been stable. He has some minor swelling in his right lower leg however it is the leg which he is still rehabilitating from a knee replacement surgery. His medications were verified and his pillbox was refilled. He is waiting on his medications to be delivered from the mail order pharmacy but seems to have a good grip on things at this time. I will begin talking to him about discharge next week.   Jacqualine Code, EMT 03/11/20  ACTION: Home visit completed Next visit planned for 1 week

## 2020-03-18 ENCOUNTER — Encounter (HOSPITAL_COMMUNITY): Payer: Medicare Other

## 2020-03-25 ENCOUNTER — Other Ambulatory Visit (HOSPITAL_COMMUNITY): Payer: Self-pay

## 2020-03-25 NOTE — Progress Notes (Signed)
Paramedicine Encounter    Patient ID: [name] [name], male    DOB: 1961/01/25, 59 y.o.   MRN: [id number]   Patient Care Team: [name], MD as PCP - General (Family Medicine) [name], [name], MD as PCP - Advanced Heart Failure (Cardiology) [name], MD as PCP - Electrophysiology (Cardiology) [name], MD as PCP - Cardiology (Cardiology) [name], MD (Family Medicine) [name], LCSW as Social Worker (Licensed Clinical Social Worker)  Patient Active Problem List   Diagnosis Date Noted  . Primary osteoarthritis of right knee 01/30/2020  . Hypokalemia 11/23/2019  . Primary osteoarthritis of right hip 11/03/2018  . VT (ventricular tachycardia) (HCC) 05/30/2017  . AKI (acute kidney injury) (HCC)   . Obesity 12/25/2016  . OSA (obstructive sleep apnea) 12/25/2016  . Hyperglycemia 12/25/2016  . Acute respiratory failure with hypoxia (HCC) 12/25/2016  . Chronic anticoagulation 06/27/2016  . Encounter for therapeutic drug monitoring 04/19/2015  . S/P ICD (internal cardiac defibrillator) procedure Medtronic CRT ICD 04/15/2015  . Atrial fibrillation with RVR - Paroxysmal 04/08/2015  . NICM 04/06/2015  . Troponin level elevated 04/06/2015  . Rectal bleeding 10/26/2011  . Hydrocele, left 05/24/2011  . Tobacco use disorder- 1/2 pk a day   . Alcohol abuse   . Obesity 09/27/2008  . CAD- minor CAD 2009 and 2016 09/27/2008  . VENTRICULAR TACHYCARDIA 09/27/2008  . Essential hypertension 06/19/2008  . Chronic systolic heart failure (HCC) 06/19/2008    Current Outpatient Medications:  .  albuterol (VENTOLIN HFA) 108 (90 Base) MCG/ACT inhaler, Inhale 1 puff into the lungs every 6 (six) hours as needed for wheezing or shortness of breath., Disp: 18 g, Rfl: 3 .  amiodarone (PACERONE) 200 MG tablet, Take 1 tablet (200 mg total) by mouth daily., Disp: 90 tablet, Rfl: 3 .  Ascorbic Acid (VITAMIN C) 100 MG tablet, Take 100 mg by mouth daily., Disp: , Rfl:   .  atorvastatin (LIPITOR) 80 MG tablet, Take 1 tablet (80 mg total) by mouth every evening., Disp: 180 tablet, Rfl: 3 .  docusate sodium (COLACE) 100 MG capsule, Take 1 capsule (100 mg total) by mouth 2 (two) times daily. (Patient not taking: Reported on 02/19/2020), Disp: 30 capsule, Rfl: 0 .  mirtazapine (REMERON) 30 MG tablet, Take 0.5 tablets (15 mg total) by mouth at bedtime., Disp: 90 tablet, Rfl: 1 .  oxyCODONE-acetaminophen (PERCOCET/ROXICET) 5-325 MG tablet, Take 1-2 tablets by mouth every 6 (six) hours as needed for severe pain. (Patient not taking: Reported on 02/25/2020), Disp: 40 tablet, Rfl: 0 .  potassium chloride SA (KLOR-CON) 20 MEQ tablet, Take 2 tablets (40 mEq total) by mouth 2 (two) times daily., Disp: 240 tablet, Rfl: 3 .  rivaroxaban (XARELTO) 20 MG TABS tablet, Take 1 tablet (20 mg total) by mouth daily with supper., Disp: 30 tablet, Rfl: 6 .  sacubitril-valsartan (ENTRESTO) 24-26 MG, Take 1 tablet by mouth 2 (two) times daily., Disp: 60 tablet, Rfl: 3 .  sotalol (BETAPACE) 80 MG tablet, Take 1 tablet (80 mg total) by mouth every 12 (twelve) hours., Disp: 60 tablet, Rfl: 6 .  spironolactone (ALDACTONE) 25 MG tablet, Take 25 mg by mouth daily., Disp: , Rfl:  .  tiZANidine (ZANAFLEX) 2 MG tablet, Take 1 tablet (2 mg total) by mouth every 8 (eight) hours as needed for muscle spasms. (Patient not taking: Reported on 03/11/2020), Disp: 40 tablet, Rfl: 0 .  torsemide (DEMADEX) 20 MG tablet, Take 3 tablets (60 mg total) by mouth  every morning AND 2 tablets (40 mg total) every evening., Disp: 300 tablet, Rfl: 3 .  triamcinolone cream (KENALOG) 0.1 %, Apply 1 application topically 2 (two) times daily., Disp: 80 g, Rfl: 1 No Known Allergies    Social History   Socioeconomic History  . Marital status: Divorced    Spouse name: Not on file  . Number of children: 2  . Years of education: Not on file  . Highest education level: Not on file  Occupational History  . Occupation: Clinical cytogeneticist  . Occupation: disabled, odd jobs  Tobacco Use  . Smoking status: Former Smoker    Packs/day: 0.50    Years: 4.00    Pack years: 2.00    Types: Cigarettes    Quit date: 10/29/2005    Years since quitting: 14.4  . Smokeless tobacco: Never Used  . Tobacco comment: about a pack a week. social  Vaping Use  . Vaping Use: Never used  Substance and Sexual Activity  . Alcohol use: Yes    Alcohol/week: 42.0 standard drinks    Types: 42 Cans of beer per week    Comment: 24 a week  . Drug use: Yes    Frequency: 7.0 times per week    Types: Marijuana    Comment: daily  . Sexual activity: Yes    Partners: Female  Other Topics Concern  . Not on file  Social History Narrative   ** Merged History Encounter **       Social Determinants of Health   Financial Resource Strain:   . Difficulty of Paying Living Expenses:   Food Insecurity:   . Worried About Programme researcher, broadcasting/film/video in the Last Year:   . Barista in the Last Year:   Transportation Needs: No Transportation Needs  . Lack of Transportation (Medical): No  . Lack of Transportation (Non-Medical): No  Physical Activity: Sufficiently Active  . Days of Exercise per Week: 7 days  . Minutes of Exercise per Session: 30 min  Stress:   . Feeling of Stress :   Social Connections:   . Frequency of Communication with Friends and Family:   . Frequency of Social Gatherings with Friends and Family:   . Attends Religious Services:   . Active Member of Clubs or Organizations:   . Attends Banker Meetings:   Marland Kitchen Marital Status:   Intimate Partner Violence:   . Fear of Current or Ex-Partner:   . Emotionally Abused:   Marland Kitchen Physically Abused:   . Sexually Abused:     Physical Exam Cardiovascular:     Rate and Rhythm: Normal rate and regular rhythm.     Pulses: Normal pulses.  Pulmonary:     Effort: Pulmonary effort is normal.     Breath sounds: Normal breath sounds.  Musculoskeletal:        General: Normal range of  motion.     Right lower leg: No edema.     Left lower leg: No edema.  Skin:    General: Skin is warm and dry.     Capillary Refill: Capillary refill takes less than 2 seconds.  Neurological:     Mental Status: He is alert and oriented to person, place, and time.  Psychiatric:        Mood and Affect: Mood normal.         Future Appointments  Date Time Provider Department Center  03/30/2020  7:20 AM [facility] DEVICE REMOTES [facility]  04/12/2020  11:45 AM [facility] DEVICE REMOTES [facility]  05/10/2020  9:30 AM [facility] PA/NP [facility] None  06/29/2020  7:20 AM [facility] DEVICE [facility]OTES [facility]    BP 109/60 (BP Location: Left Arm, Patient Position: Sitting, Cuff Size: Normal)   Pulse 74   Resp 16   Wt (!) 324 lb (147 kg)   SpO2 98%   BMI 39.44 kg/m   Weight yesterday- 324 lb Last visit weight- 323 lb  Mr Soulliere was seen at home today and reported feeling well. He denied chest pain, SOB, headache, dizziness, orthopnea, fever or cough since our last visit. He stated he has been compliant with his medications and his weight has been stable. His medications were verified and his pillbox was refilled. I will follow up in two weeks.    Jacqualine Code, EMT 03/25/20  ACTION: Home visit completed Next visit planned for 2 weeks

## 2020-03-30 ENCOUNTER — Ambulatory Visit (INDEPENDENT_AMBULATORY_CARE_PROVIDER_SITE_OTHER): Payer: Medicare Other | Admitting: *Deleted

## 2020-03-30 DIAGNOSIS — I472 Ventricular tachycardia, unspecified: Secondary | ICD-10-CM

## 2020-03-30 LAB — CUP PACEART REMOTE DEVICE CHECK
Battery Remaining Longevity: 19 mo
Battery Voltage: 2.91 V
Brady Statistic AP VP Percent: 0.18 %
Brady Statistic AP VS Percent: 9.33 %
Brady Statistic AS VP Percent: 89.65 %
Brady Statistic AS VS Percent: 0.85 %
Brady Statistic RA Percent Paced: 9.47 %
Brady Statistic RV Percent Paced: 89.49 %
Date Time Interrogation Session: 20210713012204
HighPow Impedance: 61 Ohm
Implantable Lead Implant Date: 20160727
Implantable Lead Implant Date: 20160727
Implantable Lead Implant Date: 20160727
Implantable Lead Location: 753858
Implantable Lead Location: 753859
Implantable Lead Location: 753860
Implantable Lead Model: 4598
Implantable Lead Model: 5076
Implantable Lead Model: 6935
Implantable Pulse Generator Implant Date: 20160727
Lead Channel Impedance Value: 361 Ohm
Lead Channel Impedance Value: 361 Ohm
Lead Channel Impedance Value: 361 Ohm
Lead Channel Impedance Value: 399 Ohm
Lead Channel Impedance Value: 399 Ohm
Lead Channel Impedance Value: 418 Ohm
Lead Channel Impedance Value: 418 Ohm
Lead Channel Impedance Value: 456 Ohm
Lead Channel Impedance Value: 589 Ohm
Lead Channel Impedance Value: 589 Ohm
Lead Channel Impedance Value: 608 Ohm
Lead Channel Impedance Value: 608 Ohm
Lead Channel Impedance Value: 665 Ohm
Lead Channel Pacing Threshold Amplitude: 0.5 V
Lead Channel Pacing Threshold Amplitude: 0.625 V
Lead Channel Pacing Threshold Amplitude: 1.625 V
Lead Channel Pacing Threshold Pulse Width: 0.4 ms
Lead Channel Pacing Threshold Pulse Width: 0.4 ms
Lead Channel Pacing Threshold Pulse Width: 0.6 ms
Lead Channel Sensing Intrinsic Amplitude: 10.375 mV
Lead Channel Sensing Intrinsic Amplitude: 10.375 mV
Lead Channel Sensing Intrinsic Amplitude: 2.625 mV
Lead Channel Sensing Intrinsic Amplitude: 2.625 mV
Lead Channel Setting Pacing Amplitude: 0.5 V
Lead Channel Setting Pacing Amplitude: 1.5 V
Lead Channel Setting Pacing Amplitude: 2.75 V
Lead Channel Setting Pacing Pulse Width: 0.03 ms
Lead Channel Setting Pacing Pulse Width: 0.6 ms
Lead Channel Setting Sensing Sensitivity: 0.3 mV

## 2020-03-30 NOTE — Progress Notes (Signed)
Remote ICD transmission.   

## 2020-04-07 ENCOUNTER — Other Ambulatory Visit (HOSPITAL_COMMUNITY): Payer: Self-pay

## 2020-04-07 NOTE — Progress Notes (Signed)
Paramedicine Encounter    Patient ID: [name] [name], male    DOB: April 07, 1961, 59 y.o.   MRN: [id number]   Patient Care Team: [name], MD as PCP - General (Family Medicine) [name], [name], MD as PCP - Advanced Heart Failure (Cardiology) [name], MD as PCP - Electrophysiology (Cardiology) [name], MD as PCP - Cardiology (Cardiology) [name], MD (Family Medicine) [name], LCSW as Social Worker (Licensed Clinical Social Worker)  Patient Active Problem List   Diagnosis Date Noted  . Primary osteoarthritis of right knee 01/30/2020  . Hypokalemia 11/23/2019  . Primary osteoarthritis of right hip 11/03/2018  . VT (ventricular tachycardia) (HCC) 05/30/2017  . AKI (acute kidney injury) (HCC)   . Obesity 12/25/2016  . OSA (obstructive sleep apnea) 12/25/2016  . Hyperglycemia 12/25/2016  . Acute respiratory failure with hypoxia (HCC) 12/25/2016  . Chronic anticoagulation 06/27/2016  . Encounter for therapeutic drug monitoring 04/19/2015  . S/P ICD (internal cardiac defibrillator) procedure Medtronic CRT ICD 04/15/2015  . Atrial fibrillation with RVR - Paroxysmal 04/08/2015  . NICM 04/06/2015  . Troponin level elevated 04/06/2015  . Rectal bleeding 10/26/2011  . Hydrocele, left 05/24/2011  . Tobacco use disorder- 1/2 pk a day   . Alcohol abuse   . Obesity 09/27/2008  . CAD- minor CAD 2009 and 2016 09/27/2008  . VENTRICULAR TACHYCARDIA 09/27/2008  . Essential hypertension 06/19/2008  . Chronic systolic heart failure (HCC) 06/19/2008    Current Outpatient Medications:  .  albuterol (VENTOLIN HFA) 108 (90 Base) MCG/ACT inhaler, Inhale 1 puff into the lungs every 6 (six) hours as needed for wheezing or shortness of breath., Disp: 18 g, Rfl: 3 .  amiodarone (PACERONE) 200 MG tablet, Take 1 tablet (200 mg total) by mouth daily., Disp: 90 tablet, Rfl: 3 .  Ascorbic Acid (VITAMIN C) 100 MG tablet, Take 100 mg by mouth daily., Disp: , Rfl:   .  atorvastatin (LIPITOR) 80 MG tablet, Take 1 tablet (80 mg total) by mouth every evening., Disp: 180 tablet, Rfl: 3 .  docusate sodium (COLACE) 100 MG capsule, Take 1 capsule (100 mg total) by mouth 2 (two) times daily. (Patient not taking: Reported on 02/19/2020), Disp: 30 capsule, Rfl: 0 .  mirtazapine (REMERON) 30 MG tablet, Take 0.5 tablets (15 mg total) by mouth at bedtime., Disp: 90 tablet, Rfl: 1 .  oxyCODONE-acetaminophen (PERCOCET/ROXICET) 5-325 MG tablet, Take 1-2 tablets by mouth every 6 (six) hours as needed for severe pain. (Patient not taking: Reported on 02/25/2020), Disp: 40 tablet, Rfl: 0 .  potassium chloride SA (KLOR-CON) 20 MEQ tablet, Take 2 tablets (40 mEq total) by mouth 2 (two) times daily., Disp: 240 tablet, Rfl: 3 .  rivaroxaban (XARELTO) 20 MG TABS tablet, Take 1 tablet (20 mg total) by mouth daily with supper., Disp: 30 tablet, Rfl: 6 .  sacubitril-valsartan (ENTRESTO) 24-26 MG, Take 1 tablet by mouth 2 (two) times daily., Disp: 60 tablet, Rfl: 3 .  sotalol (BETAPACE) 80 MG tablet, Take 1 tablet (80 mg total) by mouth every 12 (twelve) hours., Disp: 60 tablet, Rfl: 6 .  spironolactone (ALDACTONE) 25 MG tablet, Take 25 mg by mouth daily., Disp: , Rfl:  .  tiZANidine (ZANAFLEX) 2 MG tablet, Take 1 tablet (2 mg total) by mouth every 8 (eight) hours as needed for muscle spasms. (Patient not taking: Reported on 03/11/2020), Disp: 40 tablet, Rfl: 0 .  torsemide (DEMADEX) 20 MG tablet, Take 3 tablets (60 mg total) by mouth  every morning AND 2 tablets (40 mg total) every evening., Disp: 300 tablet, Rfl: 3 .  triamcinolone cream (KENALOG) 0.1 %, Apply 1 application topically 2 (two) times daily., Disp: 80 g, Rfl: 1 No Known Allergies    Social History   Socioeconomic History  . Marital status: Divorced    Spouse name: Not on file  . Number of children: 2  . Years of education: Not on file  . Highest education level: Not on file  Occupational History  . Occupation: Clinical cytogeneticist  . Occupation: disabled, odd jobs  Tobacco Use  . Smoking status: Former Smoker    Packs/day: 0.50    Years: 4.00    Pack years: 2.00    Types: Cigarettes    Quit date: 10/29/2005    Years since quitting: 14.4  . Smokeless tobacco: Never Used  . Tobacco comment: about a pack a week. social  Vaping Use  . Vaping Use: Never used  Substance and Sexual Activity  . Alcohol use: Yes    Alcohol/week: 42.0 standard drinks    Types: 42 Cans of beer per week    Comment: 24 a week  . Drug use: Yes    Frequency: 7.0 times per week    Types: Marijuana    Comment: daily  . Sexual activity: Yes    Partners: Female  Other Topics Concern  . Not on file  Social History Narrative   ** Merged History Encounter **       Social Determinants of Health   Financial Resource Strain:   . Difficulty of Paying Living Expenses:   Food Insecurity:   . Worried About Programme researcher, broadcasting/film/video in the Last Year:   . Barista in the Last Year:   Transportation Needs: No Transportation Needs  . Lack of Transportation (Medical): No  . Lack of Transportation (Non-Medical): No  Physical Activity: Sufficiently Active  . Days of Exercise per Week: 7 days  . Minutes of Exercise per Session: 30 min  Stress:   . Feeling of Stress :   Social Connections:   . Frequency of Communication with Friends and Family:   . Frequency of Social Gatherings with Friends and Family:   . Attends Religious Services:   . Active Member of Clubs or Organizations:   . Attends Banker Meetings:   Marland Kitchen Marital Status:   Intimate Partner Violence:   . Fear of Current or Ex-Partner:   . Emotionally Abused:   Marland Kitchen Physically Abused:   . Sexually Abused:     Physical Exam Cardiovascular:     Rate and Rhythm: Normal rate and regular rhythm.     Pulses: Normal pulses.  Pulmonary:     Effort: Pulmonary effort is normal.     Breath sounds: Normal breath sounds.  Musculoskeletal:        General: Normal range of  motion.     Right lower leg: No edema.     Left lower leg: No edema.  Skin:    General: Skin is warm and dry.     Capillary Refill: Capillary refill takes less than 2 seconds.  Neurological:     Mental Status: He is alert and oriented to person, place, and time.  Psychiatric:        Mood and Affect: Mood normal.         Future Appointments  Date Time Provider Department Center  04/12/2020 11:45 AM [facility]ICE [facility]  05/10/2020  9:30 AM [facility] PA/NP [facility] None  06/29/2020  7:20 AM [facility]ICE [facility]    BP 103/62 (BP Location: Left Arm, Patient Position: Sitting, Cuff Size: Large)   Pulse 82   Resp 16   Wt (!) 327 lb (148.3 kg)   SpO2 97%   BMI 39.80 kg/m   Weight yesterday- 325 lb Last visit weight- 324 lb  Mr Nicolson was seen at home today and reported feeling well. He denied chest pain, SOB, headache, dizziness, orthopnea, fever or cough over the past two weeks. He stated he has been compliant with his medications and his weight has been stable. His medications were verified and his pillbox was refilled. I will follow up in two weeks.  Jacqualine Code, EMT 04/07/20  ACTION: Home visit completed Next visit planned for 2 weeks

## 2020-04-12 ENCOUNTER — Ambulatory Visit (INDEPENDENT_AMBULATORY_CARE_PROVIDER_SITE_OTHER): Payer: Medicare Other

## 2020-04-12 DIAGNOSIS — Z95810 Presence of automatic (implantable) cardiac defibrillator: Secondary | ICD-10-CM | POA: Diagnosis not present

## 2020-04-12 DIAGNOSIS — I5022 Chronic systolic (congestive) heart failure: Secondary | ICD-10-CM | POA: Diagnosis not present

## 2020-04-14 NOTE — Progress Notes (Signed)
EPIC Encounter for ICM Monitoring  Patient Name: [name] [name] is a 59 y.o. male Date: 04/14/2020 Primary Care Physican: [name], MD Primary Cardiologist:[name] Electrophysiologist:[name] Bi-V Pacing:91.8% 6/23/2021Weight: 318lbs   Spoke with patient and reports feeling well at this time.  Denies fluid symptoms.    OptivolThoracic impedancenormal.  Prescribed:Torsemide20 mg 3 tablets (60 mg total) twice a day. Potassium 2 tablets (40 mEq total) twice a day.  Labs: 01/20/2020 Creatinine 1.22, BUN 22, Potassium 4.6, Sodium 138, GFR >60 12/15/2019 Creatinine 1.05, BUN 11, Potassium 4.1 Sodium 142, GFR >60 12/04/2019 Creatinine 1.40, BUN 19, Potassium 4.6, Sodium 138, GFR 56->60 11/22/2019 Creatinine 1.02, BUN 13, Potassium 4.3, Sodium 138, GFR >60 A complete set of results can be found in Results Review.  Recommendations:No changes and encouraged to call if experiencing any fluid symptoms.  Follow-up plan: ICM clinic phone appointment on9/09/2019. 91 day device clinic remote transmission10/08/2020.   EP/Cardiology Office Visits: 05/10/2020 with [facility] NP/PA.    Copy of ICM check sent to Dr. Graciela Husbands.   3 month ICM trend: 04/14/2020    1 Year ICM trend:       [name], RN 04/14/2020 4:25 PM

## 2020-04-17 ENCOUNTER — Inpatient Hospital Stay (HOSPITAL_COMMUNITY)
Admission: EM | Admit: 2020-04-17 | Discharge: 2020-04-19 | DRG: 309 | Disposition: A | Discharge status: Home / Self Care | Payer: Medicare Other | Attending: Cardiology | Admitting: Cardiology

## 2020-04-17 ENCOUNTER — Emergency Department (HOSPITAL_COMMUNITY): Payer: Medicare Other

## 2020-04-17 ENCOUNTER — Encounter (HOSPITAL_COMMUNITY): Payer: Self-pay

## 2020-04-17 DIAGNOSIS — Z6841 Body Mass Index (BMI) 40.0 and over, adult: Secondary | ICD-10-CM

## 2020-04-17 DIAGNOSIS — I428 Other cardiomyopathies: Secondary | ICD-10-CM | POA: Diagnosis present

## 2020-04-17 DIAGNOSIS — Z87891 Personal history of nicotine dependence: Secondary | ICD-10-CM

## 2020-04-17 DIAGNOSIS — Z20822 Contact with and (suspected) exposure to covid-19: Secondary | ICD-10-CM | POA: Diagnosis present

## 2020-04-17 DIAGNOSIS — Z79899 Other long term (current) drug therapy: Secondary | ICD-10-CM

## 2020-04-17 DIAGNOSIS — Z7901 Long term (current) use of anticoagulants: Secondary | ICD-10-CM | POA: Diagnosis not present

## 2020-04-17 DIAGNOSIS — I5042 Chronic combined systolic (congestive) and diastolic (congestive) heart failure: Secondary | ICD-10-CM | POA: Diagnosis present

## 2020-04-17 DIAGNOSIS — G4733 Obstructive sleep apnea (adult) (pediatric): Secondary | ICD-10-CM | POA: Diagnosis present

## 2020-04-17 DIAGNOSIS — J449 Chronic obstructive pulmonary disease, unspecified: Secondary | ICD-10-CM | POA: Diagnosis present

## 2020-04-17 DIAGNOSIS — I2510 Atherosclerotic heart disease of native coronary artery without angina pectoris: Secondary | ICD-10-CM | POA: Diagnosis present

## 2020-04-17 DIAGNOSIS — I110 Hypertensive heart disease with heart failure: Secondary | ICD-10-CM | POA: Diagnosis present

## 2020-04-17 DIAGNOSIS — I472 Ventricular tachycardia, unspecified: Secondary | ICD-10-CM

## 2020-04-17 DIAGNOSIS — Z95810 Presence of automatic (implantable) cardiac defibrillator: Secondary | ICD-10-CM | POA: Diagnosis not present

## 2020-04-17 DIAGNOSIS — I480 Paroxysmal atrial fibrillation: Secondary | ICD-10-CM | POA: Diagnosis present

## 2020-04-17 DIAGNOSIS — Z96641 Presence of right artificial hip joint: Secondary | ICD-10-CM | POA: Diagnosis present

## 2020-04-17 DIAGNOSIS — I471 Supraventricular tachycardia: Secondary | ICD-10-CM

## 2020-04-17 DIAGNOSIS — F1010 Alcohol abuse, uncomplicated: Secondary | ICD-10-CM | POA: Diagnosis present

## 2020-04-17 DIAGNOSIS — Z96651 Presence of right artificial knee joint: Secondary | ICD-10-CM | POA: Diagnosis present

## 2020-04-17 LAB — BASIC METABOLIC PANEL
Anion gap: 10 (ref 5–15)
BUN: 15 mg/dL (ref 6–20)
CO2: 27 mmol/L (ref 22–32)
Calcium: 9.9 mg/dL (ref 8.9–10.3)
Chloride: 100 mmol/L (ref 98–111)
Creatinine, Ser: 1.41 mg/dL — ABNORMAL HIGH (ref 0.61–1.24)
GFR calc Af Amer: >60 mL/min (ref >60)
GFR calc non Af Amer: 55 mL/min — ABNORMAL LOW (ref >60)
Glucose, Bld: 117 mg/dL — ABNORMAL HIGH (ref 70–99)
Potassium: 4 mmol/L (ref 3.5–5.1)
Sodium: 137 mmol/L (ref 135–145)

## 2020-04-17 LAB — CBC
HCT: 35.7 % — ABNORMAL LOW (ref 39.0–52.0)
Hemoglobin: 11.7 g/dL — ABNORMAL LOW (ref 13.0–17.0)
MCH: 32.2 pg (ref 26.0–34.0)
MCHC: 32.8 g/dL (ref 30.0–36.0)
MCV: 98.3 fL (ref 80.0–100.0)
Platelets: 196 10*3/uL (ref 150–400)
RBC: 3.63 MIL/uL — ABNORMAL LOW (ref 4.22–5.81)
RDW: 14.4 % (ref 11.5–15.5)
WBC: 4.8 10*3/uL (ref 4.0–10.5)
nRBC: 0 % (ref 0.0–0.2)

## 2020-04-17 LAB — TROPONIN I (HIGH SENSITIVITY)
Troponin I (High Sensitivity): 10 ng/L (ref <18)
Troponin I (High Sensitivity): 15 ng/L (ref <18)

## 2020-04-17 LAB — MAGNESIUM: Magnesium: 2.1 mg/dL (ref 1.7–2.4)

## 2020-04-17 LAB — BRAIN NATRIURETIC PEPTIDE: B Natriuretic Peptide: 92.6 pg/mL (ref 0.0–100.0)

## 2020-04-17 MED ORDER — RIVAROXABAN 20 MG PO TABS
20.0000 mg | ORAL_TABLET | Freq: Every day | ORAL | Status: DC
  Administered 2020-04-18: 20 mg via ORAL
  Filled 2020-04-17: qty 1

## 2020-04-17 MED ORDER — POTASSIUM CHLORIDE CRYS ER 20 MEQ PO TBCR
40.0000 meq | EXTENDED_RELEASE_TABLET | Freq: Two times a day (BID) | ORAL | Status: DC
  Administered 2020-04-18 – 2020-04-19 (×3): 40 meq via ORAL
  Filled 2020-04-17 (×3): qty 2

## 2020-04-17 MED ORDER — ONDANSETRON HCL 4 MG/2ML IJ SOLN: 4.0000 mg | Freq: Four times a day (QID) | INTRAMUSCULAR | Status: DC | PRN

## 2020-04-17 MED ORDER — SPIRONOLACTONE 25 MG PO TABS
25.0000 mg | ORAL_TABLET | Freq: Every day | ORAL | Status: DC
  Administered 2020-04-18 – 2020-04-19 (×2): 25 mg via ORAL
  Filled 2020-04-17 (×3): qty 1

## 2020-04-17 MED ORDER — MIRTAZAPINE 15 MG PO TABS
15.0000 mg | ORAL_TABLET | Freq: Every day | ORAL | Status: DC
  Administered 2020-04-18: 15 mg via ORAL
  Filled 2020-04-17 (×2): qty 1

## 2020-04-17 MED ORDER — AMIODARONE HCL IN DEXTROSE 360-4.14 MG/200ML-% IV SOLN
60.0000 mg/h | INTRAVENOUS | Status: DC
  Administered 2020-04-18: 60 mg/h via INTRAVENOUS
  Filled 2020-04-17 (×2): qty 200

## 2020-04-17 MED ORDER — AMIODARONE LOAD VIA INFUSION
150.0000 mg | Freq: Once | INTRAVENOUS | Status: AC
  Administered 2020-04-18: 150 mg via INTRAVENOUS
  Filled 2020-04-17: qty 83.34

## 2020-04-17 MED ORDER — AMIODARONE HCL IN DEXTROSE 360-4.14 MG/200ML-% IV SOLN
30.0000 mg/h | INTRAVENOUS | Status: DC
  Administered 2020-04-18 (×2): 30 mg/h via INTRAVENOUS
  Filled 2020-04-17 (×3): qty 200

## 2020-04-17 MED ORDER — SACUBITRIL-VALSARTAN 24-26 MG PO TABS
1.0000 | ORAL_TABLET | Freq: Two times a day (BID) | ORAL | Status: DC
  Administered 2020-04-18 – 2020-04-19 (×3): 1 via ORAL
  Filled 2020-04-17 (×4): qty 1

## 2020-04-17 MED ORDER — SOTALOL HCL 80 MG PO TABS
80.0000 mg | ORAL_TABLET | Freq: Two times a day (BID) | ORAL | Status: DC
  Administered 2020-04-18 – 2020-04-19 (×3): 80 mg via ORAL
  Filled 2020-04-17 (×4): qty 1

## 2020-04-17 MED ORDER — ATORVASTATIN CALCIUM 80 MG PO TABS
80.0000 mg | ORAL_TABLET | Freq: Every evening | ORAL | Status: DC
  Administered 2020-04-18: 80 mg via ORAL
  Filled 2020-04-17: qty 1

## 2020-04-17 MED ORDER — ACETAMINOPHEN 325 MG PO TABS: 650.0000 mg | ORAL_TABLET | ORAL | Status: DC | PRN

## 2020-04-17 MED ORDER — SODIUM CHLORIDE 0.9% FLUSH: 3.0000 mL | Freq: Once | INTRAVENOUS | Status: DC

## 2020-04-17 MED ORDER — TORSEMIDE 20 MG PO TABS
60.0000 mg | ORAL_TABLET | Freq: Every day | ORAL | Status: DC
  Administered 2020-04-18 – 2020-04-19 (×2): 60 mg via ORAL
  Filled 2020-04-17 (×2): qty 3

## 2020-04-17 NOTE — H&P (Signed)
Cardiology History & Physical    Patient ID: [name] [name] MRN: [id number]; DOB: [birth year]   Admission date: 04/17/2020  Primary Care Provider: [name], MD Primary Cardiologist: Marca Ancona, MD  Primary Electrophysiologist:  Sherryl Manges, MD   Chief Complaint:  ICD shock  Patient Profile:   [name] [name] is a 59 y.o. male with past medical history of chronic systolic CHF (EF 85% in April 4627), dilated NICM s/p BiV ICD, PAF with intermittent compliance with Xarelto, ETOH abuse, tobacco abuse, OSA on CPAP, and HTN  who presented to the ED after receiving 2 ICD shocks.   History of Present Illness:   [name] [name] he was at home today when he was shocked by his defibrillator.  He was eating dinner at the time.  About an hour later he was shocked again.  He has otherwise been feeling well and denies chest pain, dyspnea, weight gain, or edema.  Came to the ED and device interrogation confirmed VT.  Labs and vitals were otherwise unremarkable.  Patient had a similar presentation in March had multiple ICD shocks for VT.  He was volume overloaded at that time and received IV diuresis.  He was discharged on amiodarone 200 twice daily, but sometime in the interim this was changed to 200 daily.  He is also taking sotalol.  Heart Pathway Score:      Past Medical History:  Diagnosis Date  . AICD (automatic cardioverter/defibrillator) present   . Arthritis   . Atrial fibrillation (HCC)   . CAD (coronary artery disease)    Mild nonobstructive (Cath 09)  . Chronic systolic heart failure (HCC)    Nonischemic CM: echo 4/12 with EF 25-30%, grade 2 diast dysfxn, mild dilated aortic root 43 mm, trivial MR, mod LAE  . COPD (chronic obstructive pulmonary disease) (HCC)   . Dyspnea   . Ejection fraction < 50% 08/2019   20-25% noted on ECHO  . H/O ETOH abuse   . HTN (hypertension)   . Hypersomnia   . Nonischemic cardiomyopathy (HCC)   . Obesity, morbid (HCC)   . OSA on CPAP   .  Systolic and diastolic CHF, acute on chronic (HCC) 11/2016  . Tobacco use disorder   . Ventricular tachycardia [facility])     Past Surgical History:  Procedure Laterality Date  . CARDIAC CATHETERIZATION N/A 04/08/2015   Procedure: Right/Left Heart Cath and Coronary Angiography;  Surgeon: Corky Crafts, MD;  Location: [facility] INVASIVE CV LAB;  Service: Cardiovascular;  Laterality: N/A;  . EP IMPLANTABLE DEVICE N/A 04/14/2015   Procedure: BiV ICD Insertion CRT-D;  Surgeon: [name], MD;  Location: [facility] INVASIVE CV LAB;  Service: Cardiovascular;  Laterality: N/A;  . HYDROCELE EXCISION / REPAIR    . TOTAL HIP ARTHROPLASTY Right 11/04/2018   Procedure: TOTAL HIP ARTHROPLASTY ANTERIOR APPROACH;  Surgeon: [name], MD;  Location: [facility] ORS;  Service: Orthopedics;  Laterality: Right;  . TOTAL KNEE ARTHROPLASTY Right 01/30/2020   Procedure: TOTAL KNEE ARTHROPLASTY;  Surgeon: [name], MD;  Location: [facility] ORS;  Service: Orthopedics;  Laterality: Right;  . TUMOR EXCISION Left   . 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 VASECTOMY       Medications Prior to Admission: Prior to Admission medications   Medication Sig Start Date End Date Taking? Authorizing Provider  albuterol (VENTOLIN HFA) 108 (90 Base) MCG/ACT inhaler Inhale 1 puff into the lungs every 6 (six) hours as needed for wheezing  or shortness of breath. 12/09/19   [name], MD  amiodarone (PACERONE) 200 MG tablet Take 1 tablet (200 mg total) by mouth daily. 12/24/19   Allayne Butcher, PA-C  Ascorbic Acid (VITAMIN C) 100 MG tablet Take 100 mg by mouth daily.    [provider]  atorvastatin (LIPITOR) 80 MG tablet Take 1 tablet (80 mg total) by mouth every evening. 03/08/20   [name], MD  docusate sodium (COLACE) 100 MG capsule Take 1 capsule (100 mg total) by mouth 2 (two) times daily. Patient not taking: Reported on 02/19/2020 01/30/20   Marshia Ly, PA-C  mirtazapine (REMERON) 30 MG tablet  Take 0.5 tablets (15 mg total) by mouth at bedtime. 01/15/20   Arvilla Market, DO  oxyCODONE-acetaminophen (PERCOCET/ROXICET) 5-325 MG tablet Take 1-2 tablets by mouth every 6 (six) hours as needed for severe pain. Patient not taking: Reported on 02/25/2020 01/30/20   Marshia Ly, PA-C  potassium chloride SA (KLOR-CON) 20 MEQ tablet Take 2 tablets (40 mEq total) by mouth 2 (two) times daily. 01/16/20   [name], MD  rivaroxaban (XARELTO) 20 MG TABS tablet Take 1 tablet (20 mg total) by mouth daily with supper. 11/23/19   Creig Hines, NP  sacubitril-valsartan (ENTRESTO) 24-26 MG Take 1 tablet by mouth 2 (two) times daily. 12/10/19   [name], MD  sotalol (BETAPACE) 80 MG tablet Take 1 tablet (80 mg total) by mouth every 12 (twelve) hours. 11/23/19   Creig Hines, NP  spironolactone (ALDACTONE) 25 MG tablet Take 25 mg by mouth daily.    [provider]  tiZANidine (ZANAFLEX) 2 MG tablet Take 1 tablet (2 mg total) by mouth every 8 (eight) hours as needed for muscle spasms. Patient not taking: Reported on 03/11/2020 01/30/20   Marshia Ly, PA-C  torsemide (DEMADEX) 20 MG tablet Take 3 tablets (60 mg total) by mouth every morning AND 2 tablets (40 mg total) every evening. 01/16/20   [name], MD  triamcinolone cream (KENALOG) 0.1 % Apply 1 application topically 2 (two) times daily. Patient not taking: Reported on 04/07/2020 01/14/20   Arvilla Market, DO     Allergies:   No Known Allergies  Social History:   Social History   Socioeconomic History  . Marital status: Divorced    Spouse name: Not on file  . Number of children: 2  . Years of education: Not on file  . Highest education level: Not on file  Occupational History  . Occupation: Copy  . Occupation: disabled, odd jobs  Tobacco Use  . Smoking status: Former Smoker    Packs/day: 0.50    Years: 4.00    Pack years: 2.00    Types: Cigarettes    Quit date:  10/29/2005    Years since quitting: 14.4  . Smokeless tobacco: Never Used  . Tobacco comment: about a pack a week. social  Vaping Use  . Vaping Use: Never used  Substance and Sexual Activity  . Alcohol use: Yes    Alcohol/week: 42.0 standard drinks    Types: 42 Cans of beer per week    Comment: 24 a week  . Drug use: Yes    Frequency: 7.0 times per week    Types: Marijuana    Comment: daily  . Sexual activity: Yes    Partners: Female  Other Topics Concern  . Not on file  Social History Narrative   ** Merged History Encounter **  Social Determinants of Health   Financial Resource Strain:   . Difficulty of Paying Living Expenses:   Food Insecurity:   . Worried About Programme researcher, broadcasting/film/video in the Last Year:   . Barista in the Last Year:   Transportation Needs: No Transportation Needs  . Lack of Transportation (Medical): No  . Lack of Transportation (Non-Medical): No  Physical Activity: Sufficiently Active  . Days of Exercise per Week: 7 days  . Minutes of Exercise per Session: 30 min  Stress:   . Feeling of Stress :   Social Connections:   . Frequency of Communication with Friends and Family:   . Frequency of Social Gatherings with Friends and Family:   . Attends Religious Services:   . Active Member of Clubs or Organizations:   . Attends Banker Meetings:   Marland Kitchen Marital Status:   Intimate Partner Violence:   . Fear of Current or Ex-Partner:   . Emotionally Abused:   Marland Kitchen Physically Abused:   . Sexually Abused:      Family History:   The patient's family history includes Congestive Heart Failure in his father; Coronary artery disease in an other family member; Healthy in his brother, daughter, father, sister, sister, son, and son; Heart Problems in his mother; Other in his mother.    ROS:  Please see the history of present illness.  All other ROS reviewed and negative.     Physical Exam/Data:   Vitals:   04/17/20 1856  BP: (!) 98/51    Pulse: 89  Resp: 20  Temp: 98.2 F (36.8 C)  TempSrc: Oral  SpO2: 99%   No intake or output data in the 24 hours ending 04/17/20 2229 Last 3 Weights 04/07/2020 03/25/2020 03/11/2020  Weight (lbs) 327 lb 324 lb 323 lb  Weight (kg) 148.326 kg 146.965 kg 146.512 kg     There is no height or weight on file to calculate BMI.  Wt Readings from Last 3 Encounters:  04/07/20 (!) 148.3 kg  03/25/20 (!) 147 kg  03/11/20 (!) 146.5 kg    Physical Exam: General: Well developed, well nourished, in no acute distress. Head: Normocephalic, atraumatic, sclera non-icteric, no xanthomas, nares are without discharge.  Neck: Negative for carotid bruits. JVD not elevated. Lungs: Clear bilaterally to auscultation without wheezes, rales, or rhonchi. Breathing is unlabored. Heart: RRR with S1 S2. No murmurs, rubs, or gallops appreciated. Abdomen: Soft, non-tender, non-distended with normoactive bowel sounds. No hepatomegaly. No rebound/guarding. No obvious abdominal masses. Msk:  Strength and tone appear normal for age. Extremities: No clubbing or cyanosis. No edema.  Distal pedal pulses are 2+ and equal bilaterally. Neuro: Alert and oriented X 3. No focal deficit. No facial asymmetry. Moves all extremities spontaneously. Psych:  Responds to questions appropriately with a normal affect.    EKG:  The ECG that was done demonstrates sinus rhythm, ventricular pacing  Relevant CV Studies: 09/16/2019 TTE LV EF 20-25%  Laboratory Data:  High Sensitivity Troponin:   Recent Labs  Lab 04/17/20 1915 04/17/20 2115  TROPONINIHS 10 15      Cardiac EnzymesNo results for input(s): TROPONINI in the last 168 hours. No results for input(s): TROPIPOC in the last 168 hours.  Chemistry Recent Labs  Lab 04/17/20 1915  NA 137  K 4.0  CL 100  CO2 27  GLUCOSE 117*  BUN 15  CREATININE 1.41*  CALCIUM 9.9  GFRNONAA 55*  GFRAA >60  ANIONGAP 10    No results  for input(s): PROT, ALBUMIN, AST, ALT, ALKPHOS, BILITOT  in the last 168 hours. Hematology Recent Labs  Lab 04/17/20 1915  WBC 4.8  RBC 3.63*  HGB 11.7*  HCT 35.7*  MCV 98.3  MCH 32.2  MCHC 32.8  RDW 14.4  PLT 196   BNP Recent Labs  Lab 04/17/20 1946  BNP 92.6    DDimer No results for input(s): DDIMER in the last 168 hours.   Radiology/Studies:  DG Chest 2 View  Result Date: 04/17/2020 CLINICAL DATA:  Shortness of breath, pacer discharge X 2 EXAM: CHEST - 2 VIEW COMPARISON:  Radiograph 11/19/2019 FINDINGS: Expanded AP diameter of the chest with flattening of the diaphragms likely reflecting some hyperinflation. Chronically coarsened interstitial changes are noted. Some new mixed patchy and interstitial opacity is present towards the lung bases, more focally in the retrocardiac space and right infrahilar lung. Three lead pacer/defibrillator battery pack projects over the left chest wall with leads in stable position at the right atrium, cardiac apex and coronary sinus. Cardiomediastinal contours similar to comparison accounting for differences in inflation and portable technique. Exaggerated thoracic kyphosis. Multilevel degenerative changes are present in the imaged portions of the spine. Multilevel flowing anterior osteophytosis, compatible with features of diffuse idiopathic skeletal hyperostosis (DISH). No acute osseous abnormality or suspicious osseous lesion. IMPRESSION: 1. New mixed patchy and interstitial opacity towards the lung bases, more focally in the retrocardiac space and right infrahilar lung, may reflect developing pneumonia or edema. 2. Background of chronic interstitial changes and chronic hyperinflation with increased AP diameter chest Electronically Signed   By: Kreg Shropshire M.D.   On: 04/17/2020 19:22    Assessment and Plan   1. Ventricular tachycardia He presents with two ICD shocks due to VT.  No clear provoking factor.  He does not appear volume overloaded and has no chest pain.  Of note, he is now only taking  amiodarone 200 once daily.  We will plan admission for observation overnight.  He would likely benefit from increasing his amiodarone back to 400 daily.  We will give an IV amiodarone load overnight and then plan to resume 400 mg daily. --IV amiodarone 150 mg followed by infusion --Continue home sotalol  2. NICM Does not appear volume overloaded.  Continue home entresto, spironolactone, torsemide,potassium.  3. PAF Continue home rivaroxaban  Severity of Illness: The appropriate patient status for this patient is INPATIENT. Inpatient status is judged to be reasonable and necessary in order to provide the required intensity of service to ensure the patient's safety. The patient's presenting symptoms, physical exam findings, and initial radiographic and laboratory data in the context of their chronic comorbidities is felt to place them at high risk for further clinical deterioration. Furthermore, it is not anticipated that the patient will be medically stable for discharge from the hospital within 2 midnights of admission. The following factors support the patient status of inpatient.   " The patient's presenting symptoms include ICD shock. " The worrisome physical exam findings include none. " The initial radiographic and laboratory data are worrisome because of n/a. " The chronic co-morbidities include NICM.   * I certify that at the point of admission it is my clinical judgment that the patient will require inpatient hospital care spanning beyond 2 midnights from the point of admission due to high intensity of service, high risk for further deterioration and high frequency of surveillance required.*    For questions or updates, please contact [facility] Please consult [email or web address] for contact info  under      Signed, Allison Quarry, MD 04/17/2020, 10:29 PM

## 2020-04-17 NOTE — ED Triage Notes (Signed)
Pt arrives to ED w/ c/o sob that started today. Pt has defib and states it has shocked him twice in the last two hours. Pt endorses chest pain but states it is secondary to the shock.

## 2020-04-17 NOTE — Progress Notes (Signed)
°  Amiodarone Drug - Drug Interaction Consult Note  Recommendations: -Sotalol is a home med >> transitioning to amiodarone for recurrent ICD shocks  Amiodarone is metabolized by the cytochrome P450 system and therefore has the potential to cause many drug interactions. Amiodarone has an average plasma half-life of 50 days (range 20 to 100 days).   There is potential for drug interactions to occur several weeks or months after stopping treatment and the onset of drug interactions may be slow after initiating amiodarone.   []  Statins: Increased risk of myopathy. Simvastatin- restrict dose to 20mg  daily. Other statins: counsel patients to report any muscle pain or weakness immediately.  []  Anticoagulants: Amiodarone can increase anticoagulant effect. Consider warfarin dose reduction. Patients should be monitored closely and the dose of anticoagulant altered accordingly, remembering that amiodarone levels take several weeks to stabilize.  []  Antiepileptics: Amiodarone can increase plasma concentration of phenytoin, the dose should be reduced. Note that small changes in phenytoin dose can result in large changes in levels. Monitor patient and counsel on signs of toxicity.  [x]  Beta blockers: increased risk of bradycardia, AV block and myocardial depression. Sotalol - avoid concomitant use.  []   Calcium channel blockers (diltiazem and verapamil): increased risk of bradycardia, AV block and myocardial depression.  []   Cyclosporine: Amiodarone increases levels of cyclosporine. Reduced dose of cyclosporine is recommended.  []  Digoxin dose should be halved when amiodarone is started.  []  Diuretics: increased risk of cardiotoxicity if hypokalemia occurs.  []  Oral hypoglycemic agents (glyburide, glipizide, glimepiride): increased risk of hypoglycemia. Patient's glucose levels should be monitored closely when initiating amiodarone therapy.   []  Drugs that prolong the QT interval:  Torsades de pointes  risk may be increased with concurrent use - avoid if possible.  Monitor QTc, also keep magnesium/potassium WNL if concurrent therapy can't be avoided.  Antibiotics: e.g. fluoroquinolones, erythromycin.  Antiarrhythmics: e.g. quinidine, procainamide, disopyramide, sotalol.  Antipsychotics: e.g. phenothiazines, haloperidol.   Lithium, tricyclic antidepressants, and methadone. Thank You,   , PharmD, BCPS Clinical Pharmacist [phone] Please check AMION for all [facility] numbers 04/17/2020

## 2020-04-17 NOTE — ED Provider Notes (Signed)
[facility] EMERGENCY DEPARTMENT Provider Note   CSN: [id number] Arrival date & time: 04/17/20  1846     History Chief Complaint  Patient presents with  . Shortness of Breath    [name] [name] is a 59 y.o. male.  59 yo M with a cc of having his occurred while he started to eat dinner and then occurred about an hour later.  Both occurred while he was eating.  He denied any chest pain or pressure has had some chronic shortness of breath that he feels is unchanged.  Denies cough or fever denies abdominal pain nausea vomiting or diarrhea.  Feels like he has been eating and drinking normally.  Denies any worsening lower extremity edema denies orthopnea or PND.  The history is provided by the patient.  Shortness of Breath Severity:  Moderate Associated symptoms: cough   Associated symptoms: no abdominal pain, no chest pain, no fever, no headaches, no rash and no vomiting   Illness Severity:  Moderate Onset quality:  Gradual Duration:  2 hours Timing:  Constant Progression:  Resolved Chronicity:  New Associated symptoms: cough and shortness of breath (chronic and unchanged)   Associated symptoms: no abdominal pain, no chest pain, no congestion, no diarrhea, no fever, no headaches, no myalgias, no rash and no vomiting        Past Medical History:  Diagnosis Date  . AICD (automatic cardioverter/defibrillator) present   . Arthritis   . Atrial fibrillation (HCC)   . CAD (coronary artery disease)    Mild nonobstructive (Cath 09)  . Chronic systolic heart failure (HCC)    Nonischemic CM: echo 4/12 with EF 25-30%, grade 2 diast dysfxn, mild dilated aortic root 43 mm, trivial MR, mod LAE  . COPD (chronic obstructive pulmonary disease) (HCC)   . Dyspnea   . Ejection fraction < 50% 08/2019   20-25% noted on ECHO  . H/O ETOH abuse   . HTN (hypertension)   . Hypersomnia   . Nonischemic cardiomyopathy (HCC)   . Obesity, morbid (HCC)   . OSA on CPAP   . Systolic and  diastolic CHF, acute on chronic (HCC) 11/2016  . Tobacco use disorder   . Ventricular tachycardia [facility])     Patient Active Problem List   Diagnosis Date Noted  . Ventricular tachycardia (HCC) 04/17/2020  . Primary osteoarthritis of right knee 01/30/2020  . Hypokalemia 11/23/2019  . Primary osteoarthritis of right hip 11/03/2018  . VT (ventricular tachycardia) (HCC) 05/30/2017  . AKI (acute kidney injury) (HCC)   . Obesity 12/25/2016  . OSA (obstructive sleep apnea) 12/25/2016  . Hyperglycemia 12/25/2016  . Acute respiratory failure with hypoxia (HCC) 12/25/2016  . Chronic anticoagulation 06/27/2016  . Encounter for therapeutic drug monitoring 04/19/2015  . S/P ICD (internal cardiac defibrillator) procedure Med[employer]onic CRT ICD 04/15/2015  . Atrial fibrillation with RVR - Paroxysmal 04/08/2015  . NICM 04/06/2015  . Troponin level elevated 04/06/2015  . Rectal bleeding 10/26/2011  . Hydrocele, left 05/24/2011  . Tobacco use disorder- 1/2 pk a day   . Alcohol abuse   . Obesity 09/27/2008  . CAD- minor CAD 2009 and 2016 09/27/2008  . VENTRICULAR TACHYCARDIA 09/27/2008  . Essential hypertension 06/19/2008  . Chronic systolic heart failure (HCC) 06/19/2008    Past Surgical History:  Procedure Laterality Date  . CARDIAC CATHETERIZATION N/A 04/08/2015   Procedure: Right/Left Heart Cath and Coronary Angiography;  Surgeon: Corky Crafts, MD;  Location: [facility] INVASIVE CV LAB;  Service: Cardiovascular;  Laterality:  N/A;  . EP IMPLANTABLE DEVICE N/A 04/14/2015   Procedure: BiV ICD Insertion CRT-D;  Surgeon: [name], MD;  Location: [facility] INVASIVE CV LAB;  Service: Cardiovascular;  Laterality: N/A;  . HYDROCELE EXCISION / REPAIR    . TOTAL HIP ARTHROPLASTY Right 11/04/2018   Procedure: TOTAL HIP ARTHROPLASTY ANTERIOR APPROACH;  Surgeon: [name], MD;  Location: [facility] ORS;  Service: Orthopedics;  Laterality: Right;  . TOTAL KNEE ARTHROPLASTY Right 01/30/2020   Procedure: TOTAL KNEE  ARTHROPLASTY;  Surgeon: [name], MD;  Location: [facility] ORS;  Service: Orthopedics;  Laterality: Right;  . TUMOR EXCISION Left   . 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 VASECTOMY         Family History  Problem Relation Age of Onset  . Other Mother        cardiac surgery. late 1990s  . Heart Problems Mother        CABG AGE 108  . Congestive Heart Failure Father   . Healthy Father        AGE 32  . Coronary artery disease Other   . Healthy Brother        AGE 44  . Healthy Sister        AGE 10  . Healthy Sister        AGE 64  . Healthy Son   . Healthy Son   . Healthy Daughter     Social History   Tobacco Use  . Smoking status: Former Smoker    Packs/day: 0.50    Years: 4.00    Pack years: 2.00    Types: Cigarettes    Quit date: 10/29/2005    Years since quitting: 14.4  . Smokeless tobacco: Never Used  . Tobacco comment: about a pack a week. social  Vaping Use  . Vaping Use: Never used  Substance Use Topics  . Alcohol use: Yes    Alcohol/week: 42.0 standard drinks    Types: 42 Cans of beer per week    Comment: 24 a week  . Drug use: Yes    Frequency: 7.0 times per week    Types: Marijuana    Comment: daily    Home Medications Prior to Admission medications   Medication Sig Start Date End Date Taking? Authorizing Provider  albuterol (VENTOLIN HFA) 108 (90 Base) MCG/ACT inhaler Inhale 1 puff into the lungs every 6 (six) hours as needed for wheezing or shortness of breath. 12/09/19   [name], MD  amiodarone (PACERONE) 200 MG tablet Take 1 tablet (200 mg total) by mouth daily. 12/24/19   Allayne Butcher, PA-C  Ascorbic Acid (VITAMIN C) 100 MG tablet Take 100 mg by mouth daily.    [provider]  atorvastatin (LIPITOR) 80 MG tablet Take 1 tablet (80 mg total) by mouth every evening. 03/08/20   [name], MD  docusate sodium (COLACE) 100 MG capsule Take 1 capsule (100 mg total) by mouth 2 (two) times daily. Patient  not taking: Reported on 02/19/2020 01/30/20   Marshia Ly, PA-C  mirtazapine (REMERON) 30 MG tablet Take 0.5 tablets (15 mg total) by mouth at bedtime. 01/15/20   Arvilla Market, DO  oxyCODONE-acetaminophen (PERCOCET/ROXICET) 5-325 MG tablet Take 1-2 tablets by mouth every 6 (six) hours as needed for severe pain. Patient not taking: Reported on 02/25/2020 01/30/20   Marshia Ly, PA-C  potassium chloride SA (KLOR-CON) 20 MEQ tablet Take 2 tablets (40 mEq total)  by mouth 2 (two) times daily. 01/16/20   [name], MD  rivaroxaban (XARELTO) 20 MG TABS tablet Take 1 tablet (20 mg total) by mouth daily with supper. 11/23/19   Creig Hines, NP  sacubitril-valsartan (ENTRESTO) 24-26 MG Take 1 tablet by mouth 2 (two) times daily. 12/10/19   [name], MD  sotalol (BETAPACE) 80 MG tablet Take 1 tablet (80 mg total) by mouth every 12 (twelve) hours. 11/23/19   Creig Hines, NP  spironolactone (ALDACTONE) 25 MG tablet Take 25 mg by mouth daily.    [provider]  tiZANidine (ZANAFLEX) 2 MG tablet Take 1 tablet (2 mg total) by mouth every 8 (eight) hours as needed for muscle spasms. Patient not taking: Reported on 03/11/2020 01/30/20   Marshia Ly, PA-C  torsemide (DEMADEX) 20 MG tablet Take 3 tablets (60 mg total) by mouth every morning AND 2 tablets (40 mg total) every evening. 01/16/20   [name], MD  triamcinolone cream (KENALOG) 0.1 % Apply 1 application topically 2 (two) times daily. Patient not taking: Reported on 04/07/2020 01/14/20   Arvilla Market, DO    Allergies    Patient has no known allergies.  Review of Systems   Review of Systems  Constitutional: Negative for chills and fever.  HENT: Negative for congestion and facial swelling.   Eyes: Negative for discharge and visual disturbance.  Respiratory: Positive for cough and shortness of breath (chronic and unchanged).   Cardiovascular: Negative for chest pain and  palpitations.  Gastrointestinal: Negative for abdominal pain, diarrhea and vomiting.  Musculoskeletal: Negative for arthralgias and myalgias.  Skin: Negative for color change and rash.  Neurological: Negative for tremors, syncope and headaches.  Psychiatric/Behavioral: Negative for confusion and dysphoric mood.    Physical Exam Updated Vital Signs BP (!) 98/51 (BP Location: Right Arm)   Pulse 89   Temp 98.2 F (36.8 C) (Oral)   Resp 20   SpO2 99%   Physical Exam Vitals and nursing note reviewed.  Constitutional:      Appearance: He is well-developed.  HENT:     Head: Normocephalic and atraumatic.  Eyes:     Pupils: Pupils are equal, round, and reactive to light.  Neck:     Vascular: No JVD.  Cardiovascular:     Rate and Rhythm: Normal rate and regular rhythm.     Heart sounds: No murmur heard.  No friction rub. No gallop.   Pulmonary:     Effort: No respiratory distress.     Breath sounds: No wheezing or rales.  Abdominal:     General: There is no distension.     Tenderness: There is no guarding or rebound.  Musculoskeletal:        General: Normal range of motion.     Cervical back: Normal range of motion and neck supple.     Right lower leg: Edema present.     Left lower leg: Edema present.     Comments: Trace to BLE 1+  Skin:    Coloration: Skin is not pale.     Findings: No rash.  Neurological:     Mental Status: He is alert and oriented to person, place, and time.  Psychiatric:        Behavior: Behavior normal.     ED Results / Procedures / Treatments   Labs (all labs ordered are listed, but only abnormal results are displayed) Labs Reviewed  BASIC METABOLIC PANEL - Abnormal; Notable for the following  components:      Result Value   Glucose, Bld 117 (*)    Creatinine, Ser 1.41 (*)    GFR calc non Af Amer 55 (*)    All other components within normal limits  CBC - Abnormal; Notable for the following components:   RBC 3.63 (*)    Hemoglobin 11.7 (*)      HCT 35.7 (*)    All other components within normal limits  SARS CORONAVIRUS 2 BY RT PCR (HOSPITAL ORDER, PERFORMED IN [facility])  MAGNESIUM  BRAIN NATRIURETIC PEPTIDE  TROPONIN I (HIGH SENSITIVITY)  TROPONIN I (HIGH SENSITIVITY)    EKG EKG Interpretation  Date/Time:  Saturday April 17 2020 18:53:14 EDT Ventricular Rate:  88 PR Interval:    QRS Duration: 134 QT Interval:  378 QTC Calculation: 457 R Axis:   -46 Text Interpretation: Wide QRS rhythm Left axis deviation Non-specific intra-ventricular conduction block Inferior infarct , age undetermined T wave abnormality, consider lateral ischemia Abnormal ECG No significant change since last tracing Confirmed by Melene Plan [id number]) on 04/17/2020 7:26:36 PM   Radiology DG Chest 2 View  Result Date: 04/17/2020 CLINICAL DATA:  Shortness of breath, pacer discharge X 2 EXAM: CHEST - 2 VIEW COMPARISON:  Radiograph 11/19/2019 FINDINGS: Expanded AP diameter of the chest with flattening of the diaphragms likely reflecting some hyperinflation. Chronically coarsened interstitial changes are noted. Some new mixed patchy and interstitial opacity is present towards the lung bases, more focally in the retrocardiac space and right infrahilar lung. Three lead pacer/defibrillator battery pack projects over the left chest wall with leads in stable position at the right atrium, cardiac apex and coronary sinus. Cardiomediastinal contours similar to comparison accounting for differences in inflation and portable technique. Exaggerated thoracic kyphosis. Multilevel degenerative changes are present in the imaged portions of the spine. Multilevel flowing anterior osteophytosis, compatible with features of diffuse idiopathic skeletal hyperostosis (DISH). No acute osseous abnormality or suspicious osseous lesion. IMPRESSION: 1. New mixed patchy and interstitial opacity towards the lung bases, more focally in the retrocardiac space and right infrahilar lung,  may reflect developing pneumonia or edema. 2. Background of chronic interstitial changes and chronic hyperinflation with increased AP diameter chest Electronically Signed   By: Kreg Shropshire M.D.   On: 04/17/2020 19:22    Procedures Procedures (including critical care time)  Medications Ordered in ED Medications  sodium chloride flush (NS) 0.9 % injection 3 mL (has no administration in time range)  acetaminophen (TYLENOL) tablet 650 mg (has no administration in time range)  ondansetron (ZOFRAN) injection 4 mg (has no administration in time range)  amiodarone (NEXTERONE) 1.8 mg/mL load via infusion 150 mg (has no administration in time range)  amiodarone (NEXTERONE PREMIX) 360-4.14 MG/200ML-% (1.8 mg/mL) IV infusion (has no administration in time range)    Followed by  amiodarone (NEXTERONE PREMIX) 360-4.14 MG/200ML-% (1.8 mg/mL) IV infusion (has no administration in time range)    ED Course  I have reviewed the triage vital signs and the nursing notes.  Pertinent labs & imaging results that were available during my care of the patient were reviewed by me and considered in my medical decision making (see chart for details).    MDM Rules/Calculators/A&P                          59 yo M with a chief complaint of having his ICD fire.  Happened x2 earlier today.  Will obtain a laboratory  evaluation and electrolytes interrogate his pacemaker and discussed with cardiology. I discussed  the pacemaker interrogation report with the [employer] rep.  The patient had 2 separate episodes of ventricular tachycardia that there was an attempt to overdrive pace and the patient received defibrillatory shock.  He also has had a couple episodes of nonsustained V. tach today.  We will discuss with cards.  Cards to admit.  CRITICAL CARE Performed by: Rae Roam   Total critical care time: 35 minutes  Critical care time was exclusive of separately billable procedures and treating other  patients.  Critical care was necessary to treat or prevent imminent or life-threatening deterioration.  Critical care was time spent personally by me on the following activities: development of treatment plan with patient and/or surrogate as well as nursing, discussions with consultants, evaluation of patient's response to treatment, examination of patient, obtaining history from patient or surrogate, ordering and performing treatments and interventions, ordering and review of laboratory studies, ordering and review of radiographic studies, pulse oximetry and re-evaluation of patient's condition.  The patients results and plan were reviewed and discussed.   Any x-rays performed were independently reviewed by myself.   Differential diagnosis were considered with the presenting HPI.  Medications  sodium chloride flush (NS) 0.9 % injection 3 mL (has no administration in time range)  acetaminophen (TYLENOL) tablet 650 mg (has no administration in time range)  ondansetron (ZOFRAN) injection 4 mg (has no administration in time range)  amiodarone (NEXTERONE) 1.8 mg/mL load via infusion 150 mg (has no administration in time range)  amiodarone (NEXTERONE PREMIX) 360-4.14 MG/200ML-% (1.8 mg/mL) IV infusion (has no administration in time range)    Followed by  amiodarone (NEXTERONE PREMIX) 360-4.14 MG/200ML-% (1.8 mg/mL) IV infusion (has no administration in time range)    Vitals:   04/17/20 1856  BP: (!) 98/51  Pulse: 89  Resp: 20  Temp: 98.2 F (36.8 C)  TempSrc: Oral  SpO2: 99%    Final diagnoses:  Ventricular tachycardia (HCC)    Admission/ observation were discussed with the admitting physician, patient and/or family and they are comfortable with the plan.    Final Clinical Impression(s) / ED Diagnoses Final diagnoses:  Ventricular tachycardia [facility])    Rx / DC Orders ED Discharge Orders    None       Melene Plan, DO 04/17/20 2232

## 2020-04-18 ENCOUNTER — Encounter (HOSPITAL_COMMUNITY): Payer: Self-pay | Admitting: Cardiology

## 2020-04-18 ENCOUNTER — Other Ambulatory Visit: Payer: Self-pay

## 2020-04-18 DIAGNOSIS — I428 Other cardiomyopathies: Secondary | ICD-10-CM

## 2020-04-18 DIAGNOSIS — I472 Ventricular tachycardia: Principal | ICD-10-CM

## 2020-04-18 LAB — MRSA PCR SCREENING: MRSA by PCR: NEGATIVE

## 2020-04-18 LAB — SARS CORONAVIRUS 2 BY RT PCR (HOSPITAL ORDER, PERFORMED IN ~~LOC~~ HOSPITAL LAB): SARS Coronavirus 2: NEGATIVE

## 2020-04-18 NOTE — ED Notes (Signed)
Pt attempting to get comfortable

## 2020-04-18 NOTE — ED Notes (Signed)
No chest pain at present. 

## 2020-04-18 NOTE — ED Notes (Signed)
The pt looks uncomfortable  Nasal 02  At 2 sats low the pt reports that he uses c-pap at night

## 2020-04-18 NOTE — ED Notes (Signed)
Report called to tisha rn on 6e

## 2020-04-18 NOTE — Consult Note (Signed)
Cardiology Consultation:   Patient ID: [name] [name] MRN: [id number]; DOB: [birth year]  Admit date: 04/17/2020 Date of Consult: 04/18/2020  Primary Care Provider: [name], MD [facility] Cardiologist: Marca Ancona, MD  [facility] Electrophysiologist:  Sherryl Manges, MD    Patient Profile:   [name] [name] is a 59 y.o. male with a hx of VT and chronic systolic heart failure who is being seen today for the evaluation of recurrent VT and ICD shocks at the request of Dr. Tenny Craw.  History of Present Illness:   [name] is well known to our EP service. He is a pleasant 59 yo man with a non-ischemic CM, chronic systolic heart failure, CAD, s/p biv ICD insertion 5 years ago. He is also followed by the CHF service. He has been on maximal medical therapy under the direction of Dr. Shirlee Latch. He has not had worsening of his CHF symptoms nor chest pain. He does admit to drinking 6 beers yesteday before his ICD fired. He felt lightheaded like he was going out and then was shocked. He experienced another episode an hour later. I do not have the [facility] interrogations for my review but he was said to have recurrent VT. The patient denies medical non-compliance.    Past Medical History:  Diagnosis Date  . AICD (automatic cardioverter/defibrillator) present   . Arthritis   . Atrial fibrillation (HCC)   . CAD (coronary artery disease)    Mild nonobstructive (Cath 09)  . Chronic systolic heart failure (HCC)    Nonischemic CM: echo 4/12 with EF 25-30%, grade 2 diast dysfxn, mild dilated aortic root 43 mm, trivial MR, mod LAE  . COPD (chronic obstructive pulmonary disease) (HCC)   . Dyspnea   . Ejection fraction < 50% 08/2019   20-25% noted on ECHO  . H/O ETOH abuse   . HTN (hypertension)   . Hypersomnia   . Nonischemic cardiomyopathy (HCC)   . Obesity, morbid (HCC)   . OSA on CPAP   . Systolic and diastolic CHF, acute on chronic (HCC) 11/2016  . Tobacco use disorder   . Ventricular  tachycardia [facility])     Past Surgical History:  Procedure Laterality Date  . CARDIAC CATHETERIZATION N/A 04/08/2015   Procedure: Right/Left Heart Cath and Coronary Angiography;  Surgeon: Corky Crafts, MD;  Location: [facility] INVASIVE CV LAB;  Service: Cardiovascular;  Laterality: N/A;  . EP IMPLANTABLE DEVICE N/A 04/14/2015   Procedure: BiV ICD Insertion CRT-D;  Surgeon: [name], MD;  Location: [facility] INVASIVE CV LAB;  Service: Cardiovascular;  Laterality: N/A;  . HYDROCELE EXCISION / REPAIR    . ICD IMPLANT    . TOTAL HIP ARTHROPLASTY Right 11/04/2018   Procedure: TOTAL HIP ARTHROPLASTY ANTERIOR APPROACH;  Surgeon: [name], MD;  Location: [facility] ORS;  Service: Orthopedics;  Laterality: Right;  . TOTAL KNEE ARTHROPLASTY Right 01/30/2020   Procedure: TOTAL KNEE ARTHROPLASTY;  Surgeon: [name], MD;  Location: [facility] ORS;  Service: Orthopedics;  Laterality: Right;  . TUMOR EXCISION Left   . 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 VASECTOMY       Home Medications:  Prior to Admission medications   Medication Sig Start Date End Date Taking? Authorizing Provider  acetaminophen (TYLENOL) 325 MG tablet Take 650 mg by mouth every 6 (six) hours as needed for headache (pain).   Yes [provider]  albuterol (VENTOLIN HFA) 108 (90 Base) MCG/ACT inhaler Inhale 1 puff into the  lungs every 6 (six) hours as needed for wheezing or shortness of breath. 12/09/19  Yes [name], MD  amiodarone (PACERONE) 200 MG tablet Take 1 tablet (200 mg total) by mouth daily. Patient taking differently: Take 200 mg by mouth every evening.  12/24/19  Yes Robbie Lis M, PA-C  Ascorbic Acid (VITAMIN C) 100 MG tablet Take 100 mg by mouth daily.   Yes [provider]  atorvastatin (LIPITOR) 80 MG tablet Take 1 tablet (80 mg total) by mouth every evening. 03/08/20  Yes [name], MD  Bioflavonoid Products (BIOFLEX PO) Take 2 tablets by mouth daily.   Yes [provider]  mirtazapine (REMERON) 30 MG tablet Take 0.5 tablets (15 mg total) by mouth at bedtime. 01/15/20  Yes Arvilla Market, DO  potassium chloride SA (KLOR-CON) 20 MEQ tablet Take 2 tablets (40 mEq total) by mouth 2 (two) times daily. 01/16/20  Yes [name], MD  rivaroxaban (XARELTO) 20 MG TABS tablet Take 1 tablet (20 mg total) by mouth daily with supper. 11/23/19  Yes Creig Hines, NP  sacubitril-valsartan (ENTRESTO) 24-26 MG Take 1 tablet by mouth 2 (two) times daily. 12/10/19  Yes [name], MD  sotalol (BETAPACE) 80 MG tablet Take 1 tablet (80 mg total) by mouth every 12 (twelve) hours. 11/23/19  Yes Creig Hines, NP  spironolactone (ALDACTONE) 25 MG tablet Take 25 mg by mouth daily.   Yes [provider]  torsemide (DEMADEX) 20 MG tablet Take 3 tablets (60 mg total) by mouth every morning AND 2 tablets (40 mg total) every evening. 01/16/20  Yes [name], MD  docusate sodium (COLACE) 100 MG capsule Take 1 capsule (100 mg total) by mouth 2 (two) times daily. Patient not taking: Reported on 02/19/2020 01/30/20   Marshia Ly, PA-C  oxyCODONE-acetaminophen (PERCOCET/ROXICET) 5-325 MG tablet Take 1-2 tablets by mouth every 6 (six) hours as needed for severe pain. Patient not taking: Reported on 02/25/2020 01/30/20   Marshia Ly, PA-C  tiZANidine (ZANAFLEX) 2 MG tablet Take 1 tablet (2 mg total) by mouth every 8 (eight) hours as needed for muscle spasms. Patient not taking: Reported on 03/11/2020 01/30/20   Marshia Ly, PA-C  triamcinolone cream (KENALOG) 0.1 % Apply 1 application topically 2 (two) times daily. Patient not taking: Reported on 04/07/2020 01/14/20   Arvilla Market, DO    Inpatient Medications: Scheduled Meds: . atorvastatin  80 mg Oral QPM  . mirtazapine  15 mg Oral QHS  . potassium chloride SA  40 mEq Oral BID  . rivaroxaban  20 mg Oral Q supper  . sacubitril-valsartan  1 tablet Oral BID  . sodium  chloride flush  3 mL Intravenous Once  . sotalol  80 mg Oral Q12H  . spironolactone  25 mg Oral Daily  . torsemide  60 mg Oral Daily   Continuous Infusions: . amiodarone 30 mg/hr (04/18/20 0952)   PRN Meds: acetaminophen, ondansetron (ZOFRAN) IV  Allergies:   No Known Allergies  Social History:   Social History   Socioeconomic History  . Marital status: Divorced    Spouse name: Not on file  . Number of children: 2  . Years of education: Not on file  . Highest education level: Not on file  Occupational History  . Occupation: Copy  . Occupation: disabled, odd jobs  Tobacco Use  . Smoking status: Former Smoker    Packs/day: 0.50    Years: 4.00  Pack years: 2.00    Types: Cigarettes    Quit date: 10/29/2005    Years since quitting: 14.4  . Smokeless tobacco: Never Used  . Tobacco comment: about a pack a week. social  Vaping Use  . Vaping Use: Never used  Substance and Sexual Activity  . Alcohol use: Yes    Alcohol/week: 42.0 standard drinks    Types: 42 Cans of beer per week    Comment: 24 a week  . Drug use: Yes    Frequency: 7.0 times per week    Types: Marijuana    Comment: daily  . Sexual activity: Yes    Partners: Female  Other Topics Concern  . Not on file  Social History Narrative   ** Merged History Encounter **       Social Determinants of Health   Financial Resource Strain:   . Difficulty of Paying Living Expenses:   Food Insecurity:   . Worried About Programme researcher, broadcasting/film/video in the Last Year:   . Barista in the Last Year:   Transportation Needs: No Transportation Needs  . Lack of Transportation (Medical): No  . Lack of Transportation (Non-Medical): No  Physical Activity: Sufficiently Active  . Days of Exercise per Week: 7 days  . Minutes of Exercise per Session: 30 min  Stress:   . Feeling of Stress :   Social Connections:   . Frequency of Communication with Friends and Family:   . Frequency of Social Gatherings with Friends and  Family:   . Attends Religious Services:   . Active Member of Clubs or Organizations:   . Attends Banker Meetings:   Marland Kitchen Marital Status:   Intimate Partner Violence:   . Fear of Current or Ex-Partner:   . Emotionally Abused:   Marland Kitchen Physically Abused:   . Sexually Abused:     Family History:    Family History  Problem Relation Age of Onset  . Other Mother        cardiac surgery. late 1990s  . Heart Problems Mother        CABG AGE 76  . Congestive Heart Failure Father   . Healthy Father        AGE 28  . Coronary artery disease Other   . Healthy Brother        AGE 1  . Healthy Sister        AGE 45  . Healthy Sister        AGE 71  . Healthy Son   . Healthy Son   . Healthy Daughter      ROS:  Please see the history of present illness.   All other ROS reviewed and negative.     Physical Exam/Data:   Vitals:   04/18/20 0300 04/18/20 0319 04/18/20 0439 04/18/20 0809  BP: 98/85  107/65 118/71  Pulse: 67  66 59  Resp: 23  20 20   Temp:  97.8 F (36.6 C) 97.7 F (36.5 C) 98.1 F (36.7 C)  TempSrc:   Oral Oral  SpO2: (!) 86%  99% 96%  Weight:   (!) 151.2 kg   Height:   6\' 4"  (1.93 m)     Intake/Output Summary (Last 24 hours) at 04/18/2020 1022 Last data filed at 04/18/2020 0600 Gross per 24 hour  Intake 145.41 ml  Output --  Net 145.41 ml   Last 3 Weights 04/18/2020 04/07/2020 03/25/2020  Weight (lbs) 333 lb 6.4 oz 327 lb 324  lb  Weight (kg) 151.229 kg 148.326 kg 146.965 kg     Body mass index is 40.58 kg/m.  General:  Well nourished, well developed, in no acute distress HEENT: normal Lymph: no adenopathy Neck: no JVD Endocrine:  No thryomegaly Vascular: No carotid bruits; FA pulses 2+ bilaterally without bruits  Cardiac:  normal S1, S2; RRR; no murmur  Lungs:  clear to auscultation bilaterally, no wheezing, rhonchi or rales  Abd: soft, nontender, no hepatomegaly  Ext: no edema Musculoskeletal:  No deformities, BUE and BLE strength normal and  equal Skin: warm and dry  Neuro:  CNs 2-12 intact, no focal abnormalities noted Psych:  Normal affect   EKG:  The EKG was personally reviewed and demonstrates:  nsr with biv pacing Telemetry:  Telemetry was personally reviewed and demonstrates:  nsr with ventricular pacing  Relevant CV Studies: none  Laboratory Data:  High Sensitivity Troponin:   Recent Labs  Lab 04/17/20 1915 04/17/20 2115  TROPONINIHS 10 15     Chemistry Recent Labs  Lab 04/17/20 1915  NA 137  K 4.0  CL 100  CO2 27  GLUCOSE 117*  BUN 15  CREATININE 1.41*  CALCIUM 9.9  GFRNONAA 55*  GFRAA >60  ANIONGAP 10    No results for input(s): PROT, ALBUMIN, AST, ALT, ALKPHOS, BILITOT in the last 168 hours. Hematology Recent Labs  Lab 04/17/20 1915  WBC 4.8  RBC 3.63*  HGB 11.7*  HCT 35.7*  MCV 98.3  MCH 32.2  MCHC 32.8  RDW 14.4  PLT 196   BNP Recent Labs  Lab 04/17/20 1946  BNP 92.6    DDimer No results for input(s): DDIMER in the last 168 hours.   Radiology/Studies:  DG Chest 2 View  Result Date: 04/17/2020 CLINICAL DATA:  Shortness of breath, pacer discharge X 2 EXAM: CHEST - 2 VIEW COMPARISON:  Radiograph 11/19/2019 FINDINGS: Expanded AP diameter of the chest with flattening of the diaphragms likely reflecting some hyperinflation. Chronically coarsened interstitial changes are noted. Some new mixed patchy and interstitial opacity is present towards the lung bases, more focally in the retrocardiac space and right infrahilar lung. Three lead pacer/defibrillator battery pack projects over the left chest wall with leads in stable position at the right atrium, cardiac apex and coronary sinus. Cardiomediastinal contours similar to comparison accounting for differences in inflation and portable technique. Exaggerated thoracic kyphosis. Multilevel degenerative changes are present in the imaged portions of the spine. Multilevel flowing anterior osteophytosis, compatible with features of diffuse  idiopathic skeletal hyperostosis (DISH). No acute osseous abnormality or suspicious osseous lesion. IMPRESSION: 1. New mixed patchy and interstitial opacity towards the lung bases, more focally in the retrocardiac space and right infrahilar lung, may reflect developing pneumonia or edema. 2. Background of chronic interstitial changes and chronic hyperinflation with increased AP diameter chest Electronically Signed   By: Kreg Shropshire M.D.   On: 04/17/2020 19:22   {  Assessment and Plan:   1. Recurrent VT - his ICD appears to be working normally. He has not had any additional VT. He will be maintained on an amiodarone drip overnight and switched to oral amiodarone tomorrow if no more VT. We will obtain the full interrogation and I would imagine discharge tomorrow afternoon on 200 bid of amio for 6 weeks.  2. Chronic systolic heart failure - he does not appear to have any worsening symptoms over the past several days. No plans to change his meds at this point.    For questions  or updates, please contact [facility] Please consult [email or web address] for contact info under    Signed, Lewayne Bunting, MD  04/18/2020 10:22 AM

## 2020-04-18 NOTE — Progress Notes (Signed)
Progress Note  Patient Name: [name] [name] Date of Encounter: 04/18/2020  [facility] Cardiologist: Marca Ancona, MD  EP  Graciela Husbands  Subjective   No CP  No SOB   Inpatient Medications    Scheduled Meds: . atorvastatin  80 mg Oral QPM  . mirtazapine  15 mg Oral QHS  . potassium chloride SA  40 mEq Oral BID  . rivaroxaban  20 mg Oral Q supper  . sacubitril-valsartan  1 tablet Oral BID  . sodium chloride flush  3 mL Intravenous Once  . sotalol  80 mg Oral Q12H  . spironolactone  25 mg Oral Daily  . torsemide  60 mg Oral Daily   Continuous Infusions: . amiodarone     PRN Meds: acetaminophen, ondansetron (ZOFRAN) IV   Vital Signs    Vitals:   04/18/20 0200 04/18/20 0300 04/18/20 0319 04/18/20 0439  BP: 93/84 98/85  107/65  Pulse: 60 67  66  Resp: 14 23  20   Temp:   97.8 F (36.6 C) 97.7 F (36.5 C)  TempSrc:    Oral  SpO2: 97% (!) 86%  99%  Weight:    (!) 151.2 kg  Height:    6\' 4"  (1.93 m)    Intake/Output Summary (Last 24 hours) at 04/18/2020 0700 Last data filed at 04/18/2020 0600 Gross per 24 hour  Intake 145.41 ml  Output --  Net 145.41 ml   Last 3 Weights 04/18/2020 04/07/2020 03/25/2020  Weight (lbs) 333 lb 6.4 oz 327 lb 324 lb  Weight (kg) 151.229 kg 148.326 kg 146.965 kg      Telemetry    SR  - Personally Reviewed  ECG    No new - Personally Reviewed  Physical Exam   GEN: Obese 59 yo inNo acute distress.   Neck: No JVD Cardiac: RRR, no murmurs, rubs, or gallops.  Respiratory: Clear to auscultation bilaterally. GI: Soft, nontender, non-distended  MS: No edema; No deformity. Neuro:  Nonfocal  Psych: Normal affect   Labs    High Sensitivity Troponin:   Recent Labs  Lab 04/17/20 1915 04/17/20 2115  TROPONINIHS 10 15      Chemistry Recent Labs  Lab 04/17/20 1915  NA 137  K 4.0  CL 100  CO2 27  GLUCOSE 117*  BUN 15  CREATININE 1.41*  CALCIUM 9.9  GFRNONAA 55*  GFRAA >60  ANIONGAP 10     Hematology Recent Labs  Lab  04/17/20 1915  WBC 4.8  RBC 3.63*  HGB 11.7*  HCT 35.7*  MCV 98.3  MCH 32.2  MCHC 32.8  RDW 14.4  PLT 196    BNP Recent Labs  Lab 04/17/20 1946  BNP 92.6     DDimer No results for input(s): DDIMER in the last 168 hours.   Radiology    DG Chest 2 View  Result Date: 04/17/2020 CLINICAL DATA:  Shortness of breath, pacer discharge X 2 EXAM: CHEST - 2 VIEW COMPARISON:  Radiograph 11/19/2019 FINDINGS: Expanded AP diameter of the chest with flattening of the diaphragms likely reflecting some hyperinflation. Chronically coarsened interstitial changes are noted. Some new mixed patchy and interstitial opacity is present towards the lung bases, more focally in the retrocardiac space and right infrahilar lung. Three lead pacer/defibrillator battery pack projects over the left chest wall with leads in stable position at the right atrium, cardiac apex and coronary sinus. Cardiomediastinal contours similar to comparison accounting for differences in inflation and portable technique. Exaggerated thoracic kyphosis. Multilevel degenerative changes are  present in the imaged portions of the spine. Multilevel flowing anterior osteophytosis, compatible with features of diffuse idiopathic skeletal hyperostosis (DISH). No acute osseous abnormality or suspicious osseous lesion. IMPRESSION: 1. New mixed patchy and interstitial opacity towards the lung bases, more focally in the retrocardiac space and right infrahilar lung, may reflect developing pneumonia or edema. 2. Background of chronic interstitial changes and chronic hyperinflation with increased AP diameter chest Electronically Signed   By: Kreg Shropshire M.D.   On: 04/17/2020 19:22    Cardiac Studies   None   Patient Profile     [name] [name] is a 59 y.o. male with past medical history of chronic systolic CHF (EF 88% in April 5027), dilated NICM s/p BiV ICD, PAF with intermittent compliance with Xarelto, ETOH abuse, tobacco abuse, OSA on CPAP, and  HTNwho presented to the ED after receiving 2 ICD shocks.  Assessment & Plan    1  VT  No recurrent spells   On IV amio  Continuing po as well as sotalol  EP to see   2  NICM  Volume status looks OK    Keep on same meds   3  Hx PAF   On Xarelto  Contineu   For questions or updates, please contact [facility] Please consult [email or web address] for contact info under        Signed, Dietrich Pates, MD  04/18/2020, 7:00 AM

## 2020-04-19 MED ORDER — AMIODARONE HCL 200 MG PO TABS
200.0000 mg | ORAL_TABLET | Freq: Two times a day (BID) | ORAL | Status: DC
  Administered 2020-04-19: 200 mg via ORAL
  Filled 2020-04-19: qty 1

## 2020-04-19 MED ORDER — AMIODARONE HCL 200 MG PO TABS: 200.0000 mg | ORAL_TABLET | Freq: Two times a day (BID) | ORAL | 3 refills | Status: DC

## 2020-04-19 NOTE — Progress Notes (Signed)
Telemetry reviewed SR/VP, occ AV pacing, no VT Will change to PO amiodarone get full device interrogation  Patient feels well Anticipate discharge this afternoon  Francis Dowse, PA-C

## 2020-04-19 NOTE — Plan of Care (Signed)
PATIENT DISCHARGED WITH ALL OUTCOMES MET.

## 2020-04-19 NOTE — Discharge Instructions (Signed)
NO DRIVING 6 MONTHS 

## 2020-04-19 NOTE — Discharge Summary (Addendum)
DISCHARGE SUMMARY    Patient ID: [name] [name],  MRN: [id number], DOB/AGE: 21-Jul-1961 59 y.o.  Admit date: 04/17/2020 Discharge date: 04/19/2020  Primary Care Physician: [name], MD  Primary Cardiologist: Dr. Shirlee Latch Electrophysiologist: Dr. Graciela Husbands  Primary Discharge Diagnosis:  1. VT  Secondary Discharge Diagnosis:  1. NICM 2. Chronic CHF (systolic)     Currently compensated 3. Paroxysmal Afib     CHA2DS2Vasc is 2, on Xarelto, appropriately dosed 4. HTN 5. OSA w/CPAP 6. ETOH abuse   No Known Allergies   Procedures This Admission:  none   Brief HPI: [name] [name] is a 59 y.o. male w/PMHx as noted above admitted with ICD shocks appropriate for VT.   Hospital Course:  The patient reported that he had been doing some heavy work out in his garage the day prior and the morning of his ICD shoscks and that evening had drank about 6 beers as well.  He was feeling well, no CP, palpitations or any cardiac awareness when suddenly while eating dinner, he felt different "and knew it was coming, became lightheaded".  he did not have syncope.  He was shocked again about an hour later with the same symptoms.  Both times feeling "completely normal" afterwards.  Reports compliance with his medicines. Labs were unremarkable, potassium and mag wnl.  No CP, HS trop ok. He was not felt to be volume overloaded. CXR  Reported a new mixed patchy and interstitial opacity towards the lung bases, more focally in the retrocardiac space and right infrahilar lung, may reflect developing pneumonia or edema.  In Dr. Lubertha Basque exam and discussion with the patient, given no symptoms of illness, afebrile, did not think reflected a pneumonia.  He instructed the patient should he develop symptoms of illness, new cough, to seek attention.  Device interrogation noted: Battery and lead measurements are ok LV threshold 2.25V > output adjusted to 3.00V OptiVOl looks good 87.7%VP Programmed LV only 2  VT episodes  (rates 209 and 231bpm) 1)ATP failed, second ATP (during charge) accelerated > shock successful 2)ATP failed, 2nd ATP accelerated > shock successful   He was admitted started on amiodarone gtt, his sotalol continued, as well as his other home medicines.  EP was consulted yesterday, planned to continue amiodarone gtt and if no further VT likely discharge to home.   The patient was seen and examined by Dr. Ladona Ridgel and considered stable for discharge to home.   Discharge on amiodarone 200mg  BID  Discussed his 6 months no driving "clock" has been restarted, he understands.   Physical Exam: Vitals:   04/18/20 1113 04/19/20 0552 04/19/20 0755 04/19/20 1130  BP: 116/69 113/70 114/74 95/68  Pulse: 63 77 58 (!) 58  Resp: 20 18 18 18   Temp: 97.8 F (36.6 C) 98.6 F (37 C) 98.2 F (36.8 C) 97.9 F (36.6 C)  TempSrc: Oral Oral Oral Oral  SpO2: 95% 98% 100% 100%  Weight:  (!) 147.4 kg    Height:        GEN- The patient is well appearing, alert and oriented x 3 today.   HEENT: normocephalic, atraumatic; sclera clear, conjunctiva pink; hearing intact; oropharynx clear Lungs- CTA b/l, normal work of breathing.  No wheezes, rales, rhonchi Heart- RRR, no murmurs, rubs or gallops, PMI not laterally displaced GI- soft, non-tender, non-distended Extremities- no clubbing, cyanosis, or edema MS- no significant deformity or atrophy Skin- warm and dry, no rash or lesion Psych- euthymic mood, full affect Neuro- no gross  defecits  Labs:   Lab Results  Component Value Date   WBC 4.8 04/17/2020   HGB 11.7 (L) 04/17/2020   HCT 35.7 (L) 04/17/2020   MCV 98.3 04/17/2020   PLT 196 04/17/2020    Recent Labs  Lab 04/17/20 1915  NA 137  K 4.0  CL 100  CO2 27  BUN 15  CREATININE 1.41*  CALCIUM 9.9  GLUCOSE 117*    Discharge Medications:  Allergies as of 04/19/2020   No Known Allergies      Medication List     TAKE these medications    acetaminophen 325 MG  tablet Commonly known as: TYLENOL Take 650 mg by mouth every 6 (six) hours as needed for headache (pain).   albuterol 108 (90 Base) MCG/ACT inhaler Commonly known as: VENTOLIN HFA Inhale 1 puff into the lungs every 6 (six) hours as needed for wheezing or shortness of breath.   amiodarone 200 MG tablet Commonly known as: PACERONE Take 1 tablet (200 mg total) by mouth 2 (two) times daily. What changed: when to take this Notes to patient: Controls heart rhythm    atorvastatin 80 MG tablet Commonly known as: LIPITOR Take 1 tablet (80 mg total) by mouth every evening. Notes to patient: Lowers cholesterol and triglycerides   BIOFLEX PO Take 2 tablets by mouth daily. Notes to patient: Supplement   docusate sodium 100 MG capsule Commonly known as: Colace Take 1 capsule (100 mg total) by mouth 2 (two) times daily. Notes to patient: Stool softener    Entresto 24-26 MG Generic drug: sacubitril-valsartan Take 1 tablet by mouth 2 (two) times daily. Notes to patient: Treat heart failure    mirtazapine 30 MG tablet Commonly known as: REMERON Take 0.5 tablets (15 mg total) by mouth at bedtime. Notes to patient: Depression/sleep   oxyCODONE-acetaminophen 5-325 MG tablet Commonly known as: PERCOCET/ROXICET Take 1-2 tablets by mouth every 6 (six) hours as needed for severe pain.   potassium chloride SA 20 MEQ tablet Commonly known as: KLOR-CON Take 2 tablets (40 mEq total) by mouth 2 (two) times daily. Notes to patient: Potassium replacement  Lose potassium with urine when taking torsemide   rivaroxaban 20 MG Tabs tablet Commonly known as: Xarelto Take 1 tablet (20 mg total) by mouth daily with supper. Notes to patient: Prevents clotting in heart chamber and stroke   sotalol 80 MG tablet Commonly known as: BETAPACE Take 1 tablet (80 mg total) by mouth every 12 (twelve) hours. Notes to patient: Controls heart rhythm  Lowers blood pressure and heart rate   spironolactone 25 MG  tablet Commonly known as: ALDACTONE Take 25 mg by mouth daily. Notes to patient: Decreases fluid retention   tiZANidine 2 MG tablet Commonly known as: ZANAFLEX Take 1 tablet (2 mg total) by mouth every 8 (eight) hours as needed for muscle spasms.   torsemide 20 MG tablet Commonly known as: DEMADEX Take 3 tablets (60 mg total) by mouth every morning AND 2 tablets (40 mg total) every evening. Notes to patient: Decreases fluid retention   triamcinolone cream 0.1 % Commonly known as: KENALOG Apply 1 application topically 2 (two) times daily.   vitamin C 100 MG tablet Take 100 mg by mouth daily. Notes to patient: Supplement         Disposition: Home Discharge Instructions     Diet - low sodium heart healthy   Complete by: As directed    Increase activity slowly   Complete by: As directed  Follow-up Information     Sheilah Pigeon, PA-C Follow up.   Specialty: Cardiology Why: 05/05/2020 @ 1:00PM Contact information: [address] [place] [address] [phone]                 Duration of Discharge Encounter: Greater than 30 minutes including physician time.  Norma Fredrickson, PA-C 04/19/2020 2:44 PM  EP Attending  Patient seen and examined. Agree with above. The patient has had no recurrent VT and has been transitioned off of IV amiodarone and will continue oral amiodarone. He will continue his other meds.  Leonia Reeves.D.

## 2020-04-21 ENCOUNTER — Other Ambulatory Visit (HOSPITAL_COMMUNITY): Payer: Self-pay

## 2020-04-21 NOTE — Progress Notes (Signed)
Paramedicine Encounter    Patient ID: [name] [name], male    DOB: 1960-11-01, 59 y.o.   MRN: [id number]   Patient Care Team: [name], MD as PCP - General (Family Medicine) [name], [name], MD as PCP - Advanced Heart Failure (Cardiology) [name], MD as PCP - Electrophysiology (Cardiology) [name], MD as PCP - Cardiology (Cardiology) [name], MD (Family Medicine) [name], LCSW as Social Worker (Licensed Clinical Social Worker)  Patient Active Problem List   Diagnosis Date Noted  . Ventricular tachycardia (HCC) 04/17/2020  . Primary osteoarthritis of right knee 01/30/2020  . Hypokalemia 11/23/2019  . Primary osteoarthritis of right hip 11/03/2018  . VT (ventricular tachycardia) (HCC) 05/30/2017  . AKI (acute kidney injury) (HCC)   . Obesity 12/25/2016  . OSA (obstructive sleep apnea) 12/25/2016  . Hyperglycemia 12/25/2016  . Acute respiratory failure with hypoxia (HCC) 12/25/2016  . Chronic anticoagulation 06/27/2016  . Encounter for therapeutic drug monitoring 04/19/2015  . S/P ICD (internal cardiac defibrillator) procedure Medtronic CRT ICD 04/15/2015  . Atrial fibrillation with RVR - Paroxysmal 04/08/2015  . NICM 04/06/2015  . Troponin level elevated 04/06/2015  . Rectal bleeding 10/26/2011  . Hydrocele, left 05/24/2011  . Tobacco use disorder- 1/2 pk a day   . Alcohol abuse   . Obesity 09/27/2008  . CAD- minor CAD 2009 and 2016 09/27/2008  . VENTRICULAR TACHYCARDIA 09/27/2008  . Essential hypertension 06/19/2008  . Chronic systolic heart failure (HCC) 06/19/2008    Current Outpatient Medications:  .  acetaminophen (TYLENOL) 325 MG tablet, Take 650 mg by mouth every 6 (six) hours as needed for headache (pain)., Disp: , Rfl:  .  albuterol (VENTOLIN HFA) 108 (90 Base) MCG/ACT inhaler, Inhale 1 puff into the lungs every 6 (six) hours as needed for wheezing or shortness of breath., Disp: 18 g, Rfl: 3 .  amiodarone (PACERONE)  200 MG tablet, Take 1 tablet (200 mg total) by mouth 2 (two) times daily., Disp: 60 tablet, Rfl: 3 .  Ascorbic Acid (VITAMIN C) 100 MG tablet, Take 100 mg by mouth daily., Disp: , Rfl:  .  atorvastatin (LIPITOR) 80 MG tablet, Take 1 tablet (80 mg total) by mouth every evening., Disp: 180 tablet, Rfl: 3 .  Bioflavonoid Products (BIOFLEX PO), Take 2 tablets by mouth daily., Disp: , Rfl:  .  docusate sodium (COLACE) 100 MG capsule, Take 1 capsule (100 mg total) by mouth 2 (two) times daily. (Patient not taking: Reported on 02/19/2020), Disp: 30 capsule, Rfl: 0 .  mirtazapine (REMERON) 30 MG tablet, Take 0.5 tablets (15 mg total) by mouth at bedtime., Disp: 90 tablet, Rfl: 1 .  oxyCODONE-acetaminophen (PERCOCET/ROXICET) 5-325 MG tablet, Take 1-2 tablets by mouth every 6 (six) hours as needed for severe pain. (Patient not taking: Reported on 02/25/2020), Disp: 40 tablet, Rfl: 0 .  potassium chloride SA (KLOR-CON) 20 MEQ tablet, Take 2 tablets (40 mEq total) by mouth 2 (two) times daily., Disp: 240 tablet, Rfl: 3 .  rivaroxaban (XARELTO) 20 MG TABS tablet, Take 1 tablet (20 mg total) by mouth daily with supper., Disp: 30 tablet, Rfl: 6 .  sacubitril-valsartan (ENTRESTO) 24-26 MG, Take 1 tablet by mouth 2 (two) times daily., Disp: 60 tablet, Rfl: 3 .  sotalol (BETAPACE) 80 MG tablet, Take 1 tablet (80 mg total) by mouth every 12 (twelve) hours., Disp: 60 tablet, Rfl: 6 .  spironolactone (ALDACTONE) 25 MG tablet, Take 25 mg by mouth daily., Disp: , Rfl:  .  tiZANidine (ZANAFLEX) 2 MG tablet, Take 1 tablet (2 mg total) by mouth every 8 (eight) hours as needed for muscle spasms. (Patient not taking: Reported on 03/11/2020), Disp: 40 tablet, Rfl: 0 .  torsemide (DEMADEX) 20 MG tablet, Take 3 tablets (60 mg total) by mouth every morning AND 2 tablets (40 mg total) every evening., Disp: 300 tablet, Rfl: 3 .  triamcinolone cream (KENALOG) 0.1 %, Apply 1 application topically 2 (two) times daily. (Patient not taking:  Reported on 04/07/2020), Disp: 80 g, Rfl: 1 No Known Allergies    Social History   Socioeconomic History  . Marital status: Divorced    Spouse name: Not on file  . Number of children: 2  . Years of education: Not on file  . Highest education level: Not on file  Occupational History  . Occupation: Copy  . Occupation: disabled, odd jobs  Tobacco Use  . Smoking status: Former Smoker    Packs/day: 0.50    Years: 4.00    Pack years: 2.00    Types: Cigarettes    Quit date: 10/29/2005    Years since quitting: 14.4  . Smokeless tobacco: Never Used  . Tobacco comment: about a pack a week. social  Vaping Use  . Vaping Use: Never used  Substance and Sexual Activity  . Alcohol use: Yes    Alcohol/week: 42.0 standard drinks    Types: 42 Cans of beer per week    Comment: 24 a week  . Drug use: Yes    Frequency: 7.0 times per week    Types: Marijuana    Comment: daily  . Sexual activity: Yes    Partners: Female  Other Topics Concern  . Not on file  Social History Narrative   ** Merged History Encounter **       Social Determinants of Health   Financial Resource Strain:   . Difficulty of Paying Living Expenses:   Food Insecurity:   . Worried About Programme researcher, broadcasting/film/video in the Last Year:   . Barista in the Last Year:   Transportation Needs: No Transportation Needs  . Lack of Transportation (Medical): No  . Lack of Transportation (Non-Medical): No  Physical Activity: Sufficiently Active  . Days of Exercise per Week: 7 days  . Minutes of Exercise per Session: 30 min  Stress:   . Feeling of Stress :   Social Connections:   . Frequency of Communication with Friends and Family:   . Frequency of Social Gatherings with Friends and Family:   . Attends Religious Services:   . Active Member of Clubs or Organizations:   . Attends Banker Meetings:   Marland Kitchen Marital Status:   Intimate Partner Violence:   . Fear of Current or Ex-Partner:   . Emotionally  Abused:   Marland Kitchen Physically Abused:   . Sexually Abused:     Physical Exam Cardiovascular:     Rate and Rhythm: Normal rate and regular rhythm.     Pulses: Normal pulses.  Pulmonary:     Effort: Pulmonary effort is normal.     Breath sounds: Examination of the left-lower field reveals rhonchi. Rhonchi present.  Musculoskeletal:        General: Normal range of motion.     Right lower leg: No edema.     Left lower leg: No edema.  Skin:    General: Skin is warm and dry.     Capillary Refill: Capillary refill takes less than 2 seconds.  Neurological:     Mental Status: He is alert and oriented to person, place, and time.  Psychiatric:        Mood and Affect: Mood normal.         Future Appointments  Date Time Provider Department Center  05/05/2020  1:00 PM Sheilah Pigeon, PA-C [facility]  05/10/2020  9:30 AM [facility] PA/NP [facility] None  05/19/2020  8:55 AM [facility] DEVICE REMOTES [facility]  06/29/2020  7:20 AM [facility] DEVICE REMOTES [facility]    BP (!) 93/57 (BP Location: Left Arm, Patient Position: Sitting, Cuff Size: Large)   Pulse 71   Resp 16   Wt (!) 327 lb (148.3 kg)   SpO2 97%   BMI 39.80 kg/m   Weight yesterday- 326 lb Last visit weight- 325 lb  Mr Mink was seen at home today and reported feeling well. He was seen in the ED over the weekend after his defibrillator shocked him twice after working in the yard all day. He was evaluated and discharged with an increase in amiodarone. He stated he has been compliant with his medications and his weight has been stable. He said he has been asymptomatic since discharge. His medications were verified and his pillbox was refilled. I will follow up next week.   Jacqualine Code, EMT 04/21/20  ACTION: Home visit completed Next visit planned for 1 week

## 2020-05-04 NOTE — Progress Notes (Addendum)
Cardiology Office Note Date:  05/05/2020  Patient ID:  [name] [name], DOB 09-25-1960, MRN [id number] PCP:  Arvilla Market, DO  Cardiologist:  Dr. Antoine Poche CHF: Dr. Shirlee Latch Electrophysiologist: Dr. Graciela Husbands   Chief Complaint:  post hospital  History of Present Illness: [name] [name] is a 59 y.o. male with history of chronic systolic CHF (EF 49% in April 2018 . 20-25% Dec 2020), dilated NICM s/p bi V ICD, PAF, ETOH abuse, tobacco abuse, OSA on CPAP, and HTN.  He was admitted to [facility] 05/30/17 for VT/ICD shocks. He reported having a usual day, nothing unusual and reports compliance with all of his medicines and specifically he says this regime has kept his fluid levels good and has kept up with them, happy with the current regime.  He went to bed last night as usual, woke about 10PM feeling SOB, became lightheaded seated at the edge of his bed then diaphoretic, leaned over to steady himself on the wall/window across from him and got shocked, then again, leaned back and the next thing he knew was waking up.  He was aware of 1 or 2 shocks, not all 4.  He was started on sotalol in effort to reduce his DFTs.  Dr. Graciela Husbands noted discussion regarding ETOH, also mentioned COPD and anatomically heart discplaced posteriorly with increased air between device and heart, because of this, discussed with the patient adding new RV lead with SVC coil (rather then coil > can), and repeating DFT testing on sotalol, the patient preferred to proceed with ETOH cessation and sotalol without further for now.  Hospitalized 04/17/2020 with recurrent VT and device shock, he reported compliance with his sotalol, amiodarone was started. He was symptomatic with the VT, felt off and as he was getting weak was shocked.  This happened twice with the same symptoms He had drank about 6 beers that day, had worked heavily in the garage earlier as well. He was discharged after 24hours or so of amio gtt with no VT noted and discharged on  amiodarone 200mg  BID with his sotalol.  TODAY He feels well. No dizzy spells, near syncope or syncope, no more shocks He denies any CP, palpitations No rest SOB, minimal DOE Denies symptoms of orthopnea or PND No bleeding or signs of bleeding He sees HF team next week   Device information MDT CRT-D, implanted 04/14/15, Dr. Graciela Husbands + known PMVT hx (and AF) * h/o appropriate therapies AAD amiodarone 05/30/17 had VT failed 3 ATPs, third accelerated VT to VF, 3 failed shocks at max output 4th restored SR 7/31//21, VT w/apprpriate shocks   Past Medical History:  Diagnosis Date  . AICD (automatic cardioverter/defibrillator) present   . Arthritis   . Atrial fibrillation (HCC)   . CAD (coronary artery disease)    Mild nonobstructive (Cath 09)  . Chronic systolic heart failure (HCC)    Nonischemic CM: echo 4/12 with EF 25-30%, grade 2 diast dysfxn, mild dilated aortic root 43 mm, trivial MR, mod LAE  . COPD (chronic obstructive pulmonary disease) (HCC)   . Dyspnea   . Ejection fraction < 50% 08/2019   20-25% noted on ECHO  . H/O ETOH abuse   . HTN (hypertension)   . Hypersomnia   . Nonischemic cardiomyopathy (HCC)   . Obesity, morbid (HCC)   . OSA on CPAP   . Systolic and diastolic CHF, acute on chronic (HCC) 11/2016  . Tobacco use disorder   . Ventricular tachycardia [facility])     Past Surgical History:  Procedure Laterality Date  . CARDIAC CATHETERIZATION N/A 04/08/2015   Procedure: Right/Left Heart Cath and Coronary Angiography;  Surgeon: Corky Crafts, MD;  Location[facility] INVASIVE CV LAB;  Service: Cardiovascular;  Laterality: N/A;  . EP IMPLANTABLE DEVICE N/A 04/14/2015   Procedure: BiV ICD Insertion CRT-D;  Surgeon: [name], MD;  Location: [facility] INVASIVE CV LAB;  Service: Cardiovascular;  Laterality: N/A;  . HYDROCELE EXCISION / REPAIR    . ICD IMPLANT    . TOTAL HIP ARTHROPLASTY Right 11/04/2018   Procedure: TOTAL HIP ARTHROPLASTY ANTERIOR APPROACH;  Surgeon: [name], MD;  Location: [facility] ORS;  Service: Orthopedics;  Laterality: Right;  . TOTAL KNEE ARTHROPLASTY Right 01/30/2020   Procedure: TOTAL KNEE ARTHROPLASTY;  Surgeon: [name], MD;  Location: [facility] ORS;  Service: Orthopedics;  Laterality: Right;  . TUMOR EXCISION Left   . 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 VASECTOMY      Current Outpatient Medications  Medication Sig Dispense Refill  . acetaminophen (TYLENOL) 325 MG tablet Take 650 mg by mouth every 6 (six) hours as needed for headache (pain).    Marland Kitchen albuterol (VENTOLIN HFA) 108 (90 Base) MCG/ACT inhaler Inhale 1 puff into the lungs every 6 (six) hours as needed for wheezing or shortness of breath. 18 g 3  . amiodarone (PACERONE) 200 MG tablet Take 1 tablet (200 mg total) by mouth daily. Starting 05-19-2020 60 tablet 3  . Ascorbic Acid (VITAMIN C) 100 MG tablet Take 100 mg by mouth daily.    Marland Kitchen atorvastatin (LIPITOR) 80 MG tablet Take 1 tablet (80 mg total) by mouth every evening. 180 tablet 3  . Bioflavonoid Products (BIOFLEX PO) Take 2 tablets by mouth daily.    Marland Kitchen docusate sodium (COLACE) 100 MG capsule Take 1 capsule (100 mg total) by mouth 2 (two) times daily. 30 capsule 0  . mirtazapine (REMERON) 30 MG tablet Take 0.5 tablets (15 mg total) by mouth at bedtime. 90 tablet 1  . oxyCODONE-acetaminophen (PERCOCET/ROXICET) 5-325 MG tablet Take 1-2 tablets by mouth every 6 (six) hours as needed for severe pain. 40 tablet 0  . potassium chloride SA (KLOR-CON) 20 MEQ tablet Take 2 tablets (40 mEq total) by mouth 2 (two) times daily. 240 tablet 3  . rivaroxaban (XARELTO) 20 MG TABS tablet Take 1 tablet (20 mg total) by mouth daily with supper. 30 tablet 6  . sacubitril-valsartan (ENTRESTO) 24-26 MG Take 1 tablet by mouth 2 (two) times daily. 60 tablet 3  . sotalol (BETAPACE) 80 MG tablet Take 1 tablet (80 mg total) by mouth every 12 (twelve) hours. 60 tablet 6  . spironolactone (ALDACTONE) 25 MG tablet Take 25 mg by mouth daily.     Marland Kitchen tiZANidine (ZANAFLEX) 2 MG tablet Take 1 tablet (2 mg total) by mouth every 8 (eight) hours as needed for muscle spasms. 40 tablet 0  . torsemide (DEMADEX) 20 MG tablet Take 3 tablets (60 mg total) by mouth every morning AND 2 tablets (40 mg total) every evening. 300 tablet 3  . triamcinolone cream (KENALOG) 0.1 % Apply 1 application topically 2 (two) times daily. 80 g 1   No current facility-administered medications for this visit.    Allergies:   Patient has no known allergies.   Social History:  The patient  reports that he quit smoking about 14 years ago. His smoking use included cigarettes. He has a 2.00 pack-year smoking history. He has never used smokeless tobacco.  He reports current alcohol use of about 42.0 standard drinks of alcohol per week. He reports current drug use. Frequency: 7.00 times per week. Drug: Marijuana.   Family History:  The patient's family history includes Congestive Heart Failure in his father; Coronary artery disease in an other family member; Healthy in his brother, daughter, father, sister, sister, son, and son; Heart Problems in his mother; Other in his mother.  ROS:  Please see the history of present illness.  All other systems are reviewed and otherwise negative.   PHYSICAL EXAM:  VS:  BP 104/66   Pulse 66   Ht 6\' 4"  (1.93 m)   Wt (!) 332 lb (150.6 kg)   BMI 40.41 kg/m  BMI: Body mass index is 40.41 kg/m. Well nourished, well developed, in no acute distress  HEENT: normocephalic, atraumatic  Neck: no JVD, carotid bruits or masses Cardiac:  RRR; no significant murmurs, no rubs, or gallops Lungs:  CTA b/l, no wheezing, rhonchi or rales  Abd: soft, nontender, obese MS: no deformity or atrophy Ext: no edema  Skin: warm and dry, no rash Neuro:  No gross deficits appreciated Psych: euthymic mood, full affect  ICD site is stable, no tethering or discomfort   EKG:  Not done today  ICD interrogation done today and reviewed by myself:  Battery  estimate 39mo, lead measurements are good No VT/VF, therapies   09/16/2019: TTE IMPRESSIONS  1. Left ventricular ejection fraction, by visual estimation, is 20 to  25%. The left ventricle has severely decreased function. There is  moderately increased left ventricular hypertrophy.  2. Left ventricular diastolic parameters are consistent with Grade II  diastolic dysfunction (pseudonormalization).  3. Severely dilated left ventricular internal cavity size.  4. The left ventricle demonstrates global hypokinesis.  5. Global right ventricle has normal systolic function.The right  ventricular size is normal. No increase in right ventricular wall  thickness.  6. A pacer wire is visualized in the RV.  7. Left atrial size was moderately dilated.  8. Right atrial size was mildly dilated.  9. The mitral valve is normal in structure. No evidence of mitral valve  regurgitation. No evidence of mitral stenosis.  10. The tricuspid valve is normal in structure.  11. The aortic valve is tricuspid. Aortic valve regurgitation is not  visualized. No evidence of aortic valve sclerosis or stenosis.  12. TR signal is inadequate for assessing pulmonary artery systolic  pressure.  13. The inferior vena cava is normal in size with greater than 50%  respiratory variability, suggesting right atrial pressure of 3 mmHg.    12/26/16: TTE Study Conclusions - Left ventricle: The cavity size was moderately dilated. Systolic function was severely reduced. The estimated ejection fraction was 15%. Diffuse hypokinesis. Doppler parameters are consistent with restrictive physiology, indicative of decreased left ventricular diastolic compliance and/or increased left atrial pressure. - Aortic root: The aortic root was mildly dilated. - Mitral valve: Calcified annulus. There was mild regurgitation. - Left atrium: The atrium was severely dilated. - Pericardium, extracardiac: A trivial pericardial  effusion was identified. Impressions: - Technically difficult; severe global reduction in LV systolic function; restrictive filling; mildly dilated aortic root; mild MR; severe LAE  04/08/15: LHC  Nonobstructive coronary artery disease.  Elevated LVEDP  Moderate pulmonary artery hypertension.  Cardiac output 5.5 L/min; CI 2.1.   Recent Labs: 11/19/2019: TSH 0.701 01/20/2020: ALT 18 04/17/2020: B Natriuretic Peptide 92.6; BUN 15; Creatinine, Ser 1.41; Hemoglobin 11.7; Magnesium 2.1; Platelets 196; Potassium 4.0; Sodium 137  No results found for requested labs within last 8760 hours.   Estimated Creatinine Clearance: 91.8 mL/min (A) (by C-G formula based on SCr of 1.41 mg/dL (H)).   Wt Readings from Last 3 Encounters:  05/05/20 (!) 332 lb (150.6 kg)  04/21/20 (!) 327 lb (148.3 kg)  04/19/20 (!) 325 lb (147.4 kg)     Other studies reviewed: Additional studies/records reviewed today include: summarized above  ASSESSMENT AND PLAN:  1. VT     None further     He is reminded no driving for 6 months  Continue amiodarone 200mg  BID for 2 more weeks then once daily LFTs and TSH today  2. NICM     No symptoms or exam fidnings of volume OL    OptiVol looks good     Sees HF team next week  BV Pacing% 87.1% He is programmed (by SK at a prior hospital stay using ECG optimization) RV lead is programmed subtherapeutic LV lead is programmed  Paced AV delay (not to pace) And sensed AV (essentially VS response pacing) His QRS duration with this programming was the best Today  3. ICD    Intact function, no changes made    Programmed as noted above  4. Paroxysmal AFib     CHA2DS2Vasc is at least 2, on xarelto, appropriately dosed     0% burden  5. HTN     Looks ok   Disposition: F/u with CHF team Monday, BMET and mag today, q 3 months remotes, Dr. Tuesday in 3 months, sooner if needed  Current medicines are reviewed at length with the patient today.   The patient did not have any concerns regarding medicines.  Graciela Husbands, PA-C 05/05/2020 2:08 PM     [facility] [address] [place] [phone] (office)  [phone] (fax)

## 2020-05-05 ENCOUNTER — Telehealth (HOSPITAL_COMMUNITY): Payer: Self-pay

## 2020-05-05 ENCOUNTER — Other Ambulatory Visit: Payer: Self-pay

## 2020-05-05 ENCOUNTER — Ambulatory Visit (INDEPENDENT_AMBULATORY_CARE_PROVIDER_SITE_OTHER): Payer: Medicare Other | Admitting: Physician Assistant

## 2020-05-05 VITALS — BP 104/66 | HR 66 | Ht 76.0 in | Wt 332.0 lb

## 2020-05-05 DIAGNOSIS — I5022 Chronic systolic (congestive) heart failure: Secondary | ICD-10-CM

## 2020-05-05 DIAGNOSIS — I472 Ventricular tachycardia, unspecified: Secondary | ICD-10-CM

## 2020-05-05 DIAGNOSIS — I480 Paroxysmal atrial fibrillation: Secondary | ICD-10-CM | POA: Diagnosis not present

## 2020-05-05 DIAGNOSIS — I428 Other cardiomyopathies: Secondary | ICD-10-CM

## 2020-05-05 DIAGNOSIS — Z95810 Presence of automatic (implantable) cardiac defibrillator: Secondary | ICD-10-CM

## 2020-05-05 MED ORDER — AMIODARONE HCL 200 MG PO TABS: 200.0000 mg | ORAL_TABLET | Freq: Every day | ORAL | 3 refills | Status: DC

## 2020-05-05 NOTE — Telephone Encounter (Signed)
I called Mr Schwertner to schedule an appointment. He stated he was at the doctors office today and would not be home until later in the afternoon. I asked if he wanted me to come for a visit tomorrow but since he ws having an appointment today he stated he would just plan on meeting me next week. I agreed with this plan and will follow up next week.   Jacqualine Code, EMT 05/05/20

## 2020-05-05 NOTE — Patient Instructions (Addendum)
Medication Instructions:    IN TWO WEEKS:  START TAKING  AMIODARONE 200 MG ONCE A DAY   *If you need a refill on your cardiac medications before your next appointment, please call your pharmacy*   Lab Work:  BMET  MAG  LFTS  AND TSH today   If you have labs (blood work) drawn today and your tests are completely normal, you will receive your results only by: Marland Kitchen MyChart Message (if you have MyChart) OR . A paper copy in the mail If you have any lab test that is abnormal or we need to change your treatment, we will call you to review the results.   Testing/Procedures: NONE ORDERED  TODAY    Follow-Up: At [facility], you and your health needs are our priority.  As part of our continuing mission to provide you with exceptional heart care, we have created designated Provider Care Teams.  These Care Teams include your primary Cardiologist (physician) and Advanced Practice Providers (APPs -  Physician Assistants and Nurse Practitioners) who all work together to provide you with the care you need, when you need it.  We recommend signing up for the patient portal called "MyChart".  Sign up information is provided on this After Visit Summary.  MyChart is used to connect with patients for Virtual Visits (Telemedicine).  Patients are able to view lab/test results, encounter notes, upcoming appointments, etc.  Non-urgent messages can be sent to your provider as well.   To learn more about what you can do with MyChart, go to [email or web address].    Your next appointment:   1 month(s)  The format for your next appointment:   In Person  Provider:   You may see Sherryl Manges, MD  or one of the following Advanced Practice Providers on your designated Care Team:    Gypsy Balsam, NP  Francis Dowse, [place]  Casimiro Needle "Otilio Saber, [place]    Other Instructions

## 2020-05-06 LAB — HEPATIC FUNCTION PANEL
ALT: 10 IU/L (ref 0–44)
AST: 9 IU/L (ref 0–40)
Albumin: 4.4 g/dL (ref 3.8–4.9)
Alkaline Phosphatase: 112 IU/L (ref 48–121)
Bilirubin Total: 0.7 mg/dL (ref 0.0–1.2)
Bilirubin, Direct: 0.21 mg/dL (ref 0.00–0.40)
Total Protein: 6.4 g/dL (ref 6.0–8.5)

## 2020-05-06 LAB — BASIC METABOLIC PANEL
BUN/Creatinine Ratio: 15 (ref 9–20)
BUN: 20 mg/dL (ref 6–24)
CO2: 29 mmol/L (ref 20–29)
Calcium: 9.4 mg/dL (ref 8.7–10.2)
Chloride: 99 mmol/L (ref 96–106)
Creatinine, Ser: 1.35 mg/dL — ABNORMAL HIGH (ref 0.76–1.27)
GFR calc Af Amer: 67 mL/min/{1.73_m2} (ref >59)
GFR calc non Af Amer: 58 mL/min/{1.73_m2} — ABNORMAL LOW (ref >59)
Glucose: 88 mg/dL (ref 65–99)
Potassium: 4.7 mmol/L (ref 3.5–5.2)
Sodium: 139 mmol/L (ref 134–144)

## 2020-05-06 LAB — MAGNESIUM: Magnesium: 2.5 mg/dL — ABNORMAL HIGH (ref 1.6–2.3)

## 2020-05-06 LAB — TSH: TSH: 1.24 u[IU]/mL (ref 0.450–4.500)

## 2020-05-09 NOTE — Progress Notes (Signed)
Advanced Heart Failure Clinic Note   Primary Care: Dr. Windle Guard, MD HF Cardiology: Dr. Shirlee Latch  HPI: [name] is a 59 y.o. male with a past medical history of chronic systolic CHF  (EF 15% in April 2018), dilated NICM s/p BiV ICD, PAF with intermittent compliance with Xarelto, ETOH abuse, tobacco abuse, OSA on CPAP, and HTN.   Admitted 12/25/16-12/27/16 with acute on chronic systolic CHF, volume overload. He was diuresed 11 pounds on IV lasix.  Discharge weight was 294 pounds. It was felt that the etiology of his NICM was ETOH vs hypertension related.   Admitted 5/14-5/16/18 with volume overload, acute respiratory failure requiring Bipap. He was not taking his Entresto as he was confused about dosing and there was a mix up his pharmacy. He was diuresed with IV lasix, discharge weight was 295 pounds.   Admitted 05/30/17-06/02/17 with VT, ICD shock x 4. He is now on amiodarone and sotalol.  Echo in 12/20 showed EF 20-25%, severe LV dilation, moderate LVH, normal RV, small IVC.   He was admitted in 3/21 with ICD shock in setting of hypokalemia and heavy ETOH. Entresto was decreased with low BP.  He was diuresed due to volume overload.  [name] [name] ED 04/2020 after ICD shock x2. Amio was increased to 200 mg twice a day per EP.    Today he returns for HF follow up.Overall feeling fine. Short of breath after walking 50 yards. Denies PND/Orthopnea. Uses CPAP. Appetite ok. No fever or chills. Weight at home 324  pounds. Taking all medications.Drinks 6 beers a week.   Labs (9/18): K 3.8, creatinine 1.02 Labs (11/18): K 4.4, creatinine 1.19, TSH normal, LFTs normal Labs (11/05/18): K 4 Creatinine 1.05  Labs (12/20): K 4, creatinine 1.19 Labs (3/21): K 4.1, creatinine 1.05, hgb 10.8, LFTs normal, TSH normal  Medtronic device interrogation: Activity ~2 hours,  PMH: 1. Chronic systolic CHF: Medtronic CRT-D.  Nonischemic cardiomyopathy. Possibly ETOH-related.  - LHC (2016): No CAD.  - Echo  (2016): EF 20-25%, diffuse hypokinesis.  - Echo (4/18): EF 15%, normal RV size and systolic function.  - Echo (11/18): EF 20%, mildly dilated LV with mild LVH, normal RV size with mildly decreased RV systolic function.  - Echo (12/20): EF 20-25%, severe LV dilation, moderate LVH, normal RV, small IVC.  2. Atrial fibrillation: Paroxysmal.   3. OSA: On CPAP.  4. ETOH abuse: Prior heavy ETOH.  5. H/o VT 6. COPD: Prior smoker.   ROS: All systems reviewed and negative except as per HPI.   Current Outpatient Medications  Medication Sig Dispense Refill  . acetaminophen (TYLENOL) 325 MG tablet Take 650 mg by mouth every 6 (six) hours as needed for headache (pain).    Marland Kitchen albuterol (VENTOLIN HFA) 108 (90 Base) MCG/ACT inhaler Inhale 1 puff into the lungs every 6 (six) hours as needed for wheezing or shortness of breath. 18 g 3  . amiodarone (PACERONE) 200 MG tablet Take 1 tablet (200 mg total) by mouth daily. Starting 05-19-2020 (Patient taking differently: Take 200 mg by mouth 2 (two) times daily. Starting 05-19-2020) 60 tablet 3  . Ascorbic Acid (VITAMIN C) 100 MG tablet Take 100 mg by mouth daily.    Marland Kitchen atorvastatin (LIPITOR) 80 MG tablet Take 1 tablet (80 mg total) by mouth every evening. 180 tablet 3  . Bioflavonoid Products (BIOFLEX PO) Take 2 tablets by mouth daily.    Marland Kitchen docusate sodium (COLACE) 100 MG capsule Take 1 capsule (100 mg total) by  mouth 2 (two) times daily. 30 capsule 0  . mirtazapine (REMERON) 30 MG tablet Take 0.5 tablets (15 mg total) by mouth at bedtime. 90 tablet 1  . oxyCODONE-acetaminophen (PERCOCET/ROXICET) 5-325 MG tablet Take 1-2 tablets by mouth every 6 (six) hours as needed for severe pain. 40 tablet 0  . potassium chloride SA (KLOR-CON) 20 MEQ tablet Take 2 tablets (40 mEq total) by mouth 2 (two) times daily. 240 tablet 3  . rivaroxaban (XARELTO) 20 MG TABS tablet Take 1 tablet (20 mg total) by mouth daily with supper. 30 tablet 6  . sacubitril-valsartan (ENTRESTO) 24-26 MG  Take 1 tablet by mouth 2 (two) times daily. 60 tablet 3  . sotalol (BETAPACE) 80 MG tablet Take 1 tablet (80 mg total) by mouth every 12 (twelve) hours. 60 tablet 6  . spironolactone (ALDACTONE) 25 MG tablet Take 25 mg by mouth daily.    Marland Kitchen tiZANidine (ZANAFLEX) 2 MG tablet Take 1 tablet (2 mg total) by mouth every 8 (eight) hours as needed for muscle spasms. 40 tablet 0  . torsemide (DEMADEX) 20 MG tablet Take 3 tablets (60 mg total) by mouth every morning AND 2 tablets (40 mg total) every evening. 300 tablet 3  . triamcinolone cream (KENALOG) 0.1 % Apply 1 application topically 2 (two) times daily. 80 g 1   No current facility-administered medications for this encounter.    No Known Allergies    Social History   Socioeconomic History  . Marital status: Divorced    Spouse name: Not on file  . Number of children: 2  . Years of education: Not on file  . Highest education level: Not on file  Occupational History  . Occupation: Copy  . Occupation: disabled, odd jobs  Tobacco Use  . Smoking status: Former Smoker    Packs/day: 0.50    Years: 4.00    Pack years: 2.00    Types: Cigarettes    Quit date: 10/29/2005    Years since quitting: 14.5  . Smokeless tobacco: Never Used  . Tobacco comment: about a pack a week. social  Vaping Use  . Vaping Use: Never used  Substance and Sexual Activity  . Alcohol use: Yes    Alcohol/week: 42.0 standard drinks    Types: 42 Cans of beer per week    Comment: 24 a week  . Drug use: Yes    Frequency: 7.0 times per week    Types: Marijuana    Comment: daily  . Sexual activity: Yes    Partners: Female  Other Topics Concern  . Not on file  Social History Narrative   ** Merged History Encounter **       Social Determinants of Health   Financial Resource Strain:   . Difficulty of Paying Living Expenses: Not on file  Food Insecurity:   . Worried About Programme researcher, broadcasting/film/video in the Last Year: Not on file  . Ran Out of Food in the Last  Year: Not on file  Transportation Needs: No Transportation Needs  . Lack of Transportation (Medical): No  . Lack of Transportation (Non-Medical): No  Physical Activity: Sufficiently Active  . Days of Exercise per Week: 7 days  . Minutes of Exercise per Session: 30 min  Stress:   . Feeling of Stress : Not on file  Social Connections:   . Frequency of Communication with Friends and Family: Not on file  . Frequency of Social Gatherings with Friends and Family: Not on file  .  Attends Religious Services: Not on file  . Active Member of Clubs or Organizations: Not on file  . Attends Banker Meetings: Not on file  . Marital Status: Not on file  Intimate Partner Violence:   . Fear of Current or Ex-Partner: Not on file  . Emotionally Abused: Not on file  . Physically Abused: Not on file  . Sexually Abused: Not on file      Family History  Problem Relation Age of Onset  . Other Mother        cardiac surgery. late 1990s  . Heart Problems Mother        CABG AGE 7  . Congestive Heart Failure Father   . Healthy Father        AGE 53  . Coronary artery disease Other   . Healthy Brother        AGE 69  . Healthy Sister        AGE 63  . Healthy Sister        AGE 98  . Healthy Son   . Healthy Son   . Healthy Daughter     Vitals:   05/10/20 0933  BP: (!) 142/84  Pulse: 64  SpO2: 95%  Weight: (!) 149.8 kg (330 lb 3.2 oz)   Wt Readings from Last 3 Encounters:  05/10/20 (!) 149.8 kg (330 lb 3.2 oz)  05/05/20 (!) 150.6 kg (332 lb)  04/21/20 (!) 148.3 kg (327 lb)     PHYSICAL EXAM: General:  Well appearing. No resp difficulty HEENT: normal Neck: supple. no JVD. Carotids 2+ bilat; no bruits. No lymphadenopathy or thryomegaly appreciated. Cor: PMI nondisplaced. Regular rate & rhythm. No rubs, gallops or murmurs. Lungs: clear Abdomen: soft, nontender, nondistended. No hepatosplenomegaly. No bruits or masses. Good bowel sounds. Extremities: no cyanosis, clubbing,  rash, edema Neuro: alert & orientedx3, cranial nerves grossly intact. moves all 4 extremities w/o difficulty. Affect pleasant  ASSESSMENT & PLAN: 1. Chronic systolic CHF: NICM, Echo 12/2016 EF 15%. Normal coronaries on cath in July 2016. Medtronic CRT-D device. ETOH vs HTN vs myocarditis as cause of cardiomyopathy. Echo 12/20 showed that EF remains 20-25%.   - Discussed optivol.  - He did not tolerate Farxiga. - NYHA III. Volume status stable. Continue torsemide to 60 qam/40 qpm with KCl 40 bid.  - Continue Entresto  49-51 mg twice a day. SBP usually in 100-110s.   Will continue current dose.  - Continue spironolactone 25 mg daily - Intolerant to Coreg in the past with hypotension, he is on sotalol and amiodarone.  - He needs to cut back on ETOH as he is still drinking a decent amount.  - 2. Paroxysmal atrial fibrillation:  - Continue amiodarone 200 mg twice a day .  Should have regular eye exam.  - Continue Xarelto 20 mg daily.   3. ETOH abuse: Suspect this contributes to cardiomyopathy. Discussed cessation.  4. Tobacco abuse: No longer smoking.   5. VT:  Shocked x2 04/2020. Followed closely by EP, on higher dose of amio and remains on sotalol.  6. OSA: Continue nightly CPAP.    Follow up in 4 months with Dr Shirlee Latch and an ECHO.    Tonye Becket, NP 05/10/20

## 2020-05-10 ENCOUNTER — Encounter (HOSPITAL_COMMUNITY): Payer: Self-pay

## 2020-05-10 ENCOUNTER — Ambulatory Visit (HOSPITAL_COMMUNITY)
Admission: RE | Admit: 2020-05-10 | Discharge: 2020-05-10 | Disposition: A | Discharge status: Home / Self Care | Payer: Medicare Other | Source: Ambulatory Visit | Attending: Internal Medicine | Admitting: Internal Medicine

## 2020-05-10 ENCOUNTER — Other Ambulatory Visit: Payer: Self-pay

## 2020-05-10 VITALS — BP 142/84 | HR 64 | Wt 330.2 lb

## 2020-05-10 DIAGNOSIS — I110 Hypertensive heart disease with heart failure: Secondary | ICD-10-CM | POA: Diagnosis present

## 2020-05-10 DIAGNOSIS — I5022 Chronic systolic (congestive) heart failure: Secondary | ICD-10-CM | POA: Diagnosis not present

## 2020-05-10 DIAGNOSIS — Z87891 Personal history of nicotine dependence: Secondary | ICD-10-CM | POA: Insufficient documentation

## 2020-05-10 DIAGNOSIS — I480 Paroxysmal atrial fibrillation: Secondary | ICD-10-CM

## 2020-05-10 DIAGNOSIS — J449 Chronic obstructive pulmonary disease, unspecified: Secondary | ICD-10-CM | POA: Diagnosis not present

## 2020-05-10 DIAGNOSIS — Z7901 Long term (current) use of anticoagulants: Secondary | ICD-10-CM | POA: Diagnosis not present

## 2020-05-10 DIAGNOSIS — Z9989 Dependence on other enabling machines and devices: Secondary | ICD-10-CM

## 2020-05-10 DIAGNOSIS — I472 Ventricular tachycardia, unspecified: Secondary | ICD-10-CM

## 2020-05-10 DIAGNOSIS — I428 Other cardiomyopathies: Secondary | ICD-10-CM | POA: Diagnosis not present

## 2020-05-10 DIAGNOSIS — Z79899 Other long term (current) drug therapy: Secondary | ICD-10-CM | POA: Diagnosis not present

## 2020-05-10 DIAGNOSIS — Z8249 Family history of ischemic heart disease and other diseases of the circulatory system: Secondary | ICD-10-CM | POA: Insufficient documentation

## 2020-05-10 DIAGNOSIS — F1010 Alcohol abuse, uncomplicated: Secondary | ICD-10-CM | POA: Diagnosis not present

## 2020-05-10 DIAGNOSIS — G4733 Obstructive sleep apnea (adult) (pediatric): Secondary | ICD-10-CM

## 2020-05-10 LAB — BASIC METABOLIC PANEL
Anion gap: 10 (ref 5–15)
BUN: 15 mg/dL (ref 6–20)
CO2: 27 mmol/L (ref 22–32)
Calcium: 9.2 mg/dL (ref 8.9–10.3)
Chloride: 99 mmol/L (ref 98–111)
Creatinine, Ser: 1.27 mg/dL — ABNORMAL HIGH (ref 0.61–1.24)
GFR calc Af Amer: >60 mL/min (ref >60)
GFR calc non Af Amer: >60 mL/min (ref >60)
Glucose, Bld: 160 mg/dL — ABNORMAL HIGH (ref 70–99)
Potassium: 4.1 mmol/L (ref 3.5–5.1)
Sodium: 136 mmol/L (ref 135–145)

## 2020-05-10 NOTE — Patient Instructions (Signed)
It was great to see you today! No medication changes are needed at this time.  Your physician recommends that you schedule a follow-up appointment in: 4 months with Dr Shirlee Latch  Your physician has requested that you have an echocardiogram. Echocardiography is a painless test that uses sound waves to create images of your heart. It provides your doctor with information about the size and shape of your heart and how well your heart's chambers and valves are working. This procedure takes approximately one hour. There are no restrictions for this procedure.  Do the following things EVERYDAY: 1) Weigh yourself in the morning before breakfast. Write it down and keep it in a log. 2) Take your medicines as prescribed 3) Eat low salt foods--Limit salt (sodium) to 2000 mg per day.  4) Stay as active as you can everyday 5) Limit all fluids for the day to less than 2 liters  If you have any questions or concerns before your next appointment please send [place] a message through [place] or call our office at [phone].    TO LEAVE A MESSAGE FOR THE NURSE SELECT OPTION 2, PLEASE LEAVE A MESSAGE INCLUDING: . YOUR NAME . DATE OF BIRTH . CALL BACK NUMBER . REASON FOR CALL**this is important as we prioritize the call backs  YOU WILL RECEIVE A CALL BACK THE SAME DAY AS LONG AS YOU CALL BEFORE 4:00 PM

## 2020-05-14 ENCOUNTER — Telehealth (HOSPITAL_COMMUNITY): Payer: Self-pay | Admitting: Pharmacy Technician

## 2020-05-14 ENCOUNTER — Other Ambulatory Visit (HOSPITAL_COMMUNITY): Payer: Self-pay

## 2020-05-14 NOTE — Progress Notes (Signed)
Paramedicine Encounter    Patient ID: [name] [name], male    DOB: 09-18-1961, 59 y.o.   MRN: [id number]   Patient Care Team: Arvilla Market, DO as PCP - General (Family Medicine) [name], [name], MD as PCP - Advanced Heart Failure (Cardiology) [name], MD as PCP - Electrophysiology (Cardiology) [name], MD as PCP - Cardiology (Cardiology) [name], MD (Family Medicine) [name], LCSW as Social Worker (Licensed Clinical Social Worker)  Patient Active Problem List   Diagnosis Date Noted  . Ventricular tachycardia (HCC) 04/17/2020  . Primary osteoarthritis of right knee 01/30/2020  . Hypokalemia 11/23/2019  . Primary osteoarthritis of right hip 11/03/2018  . VT (ventricular tachycardia) (HCC) 05/30/2017  . AKI (acute kidney injury) (HCC)   . Obesity 12/25/2016  . OSA (obstructive sleep apnea) 12/25/2016  . Hyperglycemia 12/25/2016  . Acute respiratory failure with hypoxia (HCC) 12/25/2016  . Chronic anticoagulation 06/27/2016  . Encounter for therapeutic drug monitoring 04/19/2015  . S/P ICD (internal cardiac defibrillator) procedure Medtronic CRT ICD 04/15/2015  . Atrial fibrillation with RVR - Paroxysmal 04/08/2015  . NICM 04/06/2015  . Troponin level elevated 04/06/2015  . Rectal bleeding 10/26/2011  . Hydrocele, left 05/24/2011  . Tobacco use disorder- 1/2 pk a day   . Alcohol abuse   . Obesity 09/27/2008  . CAD- minor CAD 2009 and 2016 09/27/2008  . VENTRICULAR TACHYCARDIA 09/27/2008  . Essential hypertension 06/19/2008  . Chronic systolic heart failure (HCC) 06/19/2008    Current Outpatient Medications:  .  acetaminophen (TYLENOL) 325 MG tablet, Take 650 mg by mouth every 6 (six) hours as needed for headache (pain)., Disp: , Rfl:  .  albuterol (VENTOLIN HFA) 108 (90 Base) MCG/ACT inhaler, Inhale 1 puff into the lungs every 6 (six) hours as needed for wheezing or shortness of breath., Disp: 18 g, Rfl: 3 .  amiodarone  (PACERONE) 200 MG tablet, Take 1 tablet (200 mg total) by mouth daily. Starting 05-19-2020 (Patient taking differently: Take 200 mg by mouth 2 (two) times daily. Starting 05-19-2020), Disp: 60 tablet, Rfl: 3 .  Ascorbic Acid (VITAMIN C) 100 MG tablet, Take 100 mg by mouth daily., Disp: , Rfl:  .  atorvastatin (LIPITOR) 80 MG tablet, Take 1 tablet (80 mg total) by mouth every evening., Disp: 180 tablet, Rfl: 3 .  Bioflavonoid Products (BIOFLEX PO), Take 2 tablets by mouth daily., Disp: , Rfl:  .  docusate sodium (COLACE) 100 MG capsule, Take 1 capsule (100 mg total) by mouth 2 (two) times daily., Disp: 30 capsule, Rfl: 0 .  mirtazapine (REMERON) 30 MG tablet, Take 0.5 tablets (15 mg total) by mouth at bedtime., Disp: 90 tablet, Rfl: 1 .  oxyCODONE-acetaminophen (PERCOCET/ROXICET) 5-325 MG tablet, Take 1-2 tablets by mouth every 6 (six) hours as needed for severe pain., Disp: 40 tablet, Rfl: 0 .  potassium chloride SA (KLOR-CON) 20 MEQ tablet, Take 2 tablets (40 mEq total) by mouth 2 (two) times daily., Disp: 240 tablet, Rfl: 3 .  rivaroxaban (XARELTO) 20 MG TABS tablet, Take 1 tablet (20 mg total) by mouth daily with supper., Disp: 30 tablet, Rfl: 6 .  sacubitril-valsartan (ENTRESTO) 24-26 MG, Take 1 tablet by mouth 2 (two) times daily., Disp: 60 tablet, Rfl: 3 .  sotalol (BETAPACE) 80 MG tablet, Take 1 tablet (80 mg total) by mouth every 12 (twelve) hours., Disp: 60 tablet, Rfl: 6 .  spironolactone (ALDACTONE) 25 MG tablet, Take 25 mg by mouth daily., Disp: , Rfl:  .  tiZANidine (ZANAFLEX) 2 MG tablet, Take 1 tablet (2 mg total) by mouth every 8 (eight) hours as needed for muscle spasms., Disp: 40 tablet, Rfl: 0 .  torsemide (DEMADEX) 20 MG tablet, Take 3 tablets (60 mg total) by mouth every morning AND 2 tablets (40 mg total) every evening., Disp: 300 tablet, Rfl: 3 .  triamcinolone cream (KENALOG) 0.1 %, Apply 1 application topically 2 (two) times daily., Disp: 80 g, Rfl: 1 No Known  Allergies    Social History   Socioeconomic History  . Marital status: Divorced    Spouse name: Not on file  . Number of children: 2  . Years of education: Not on file  . Highest education level: Not on file  Occupational History  . Occupation: Copy  . Occupation: disabled, odd jobs  Tobacco Use  . Smoking status: Former Smoker    Packs/day: 0.50    Years: 4.00    Pack years: 2.00    Types: Cigarettes    Quit date: 10/29/2005    Years since quitting: 14.5  . Smokeless tobacco: Never Used  . Tobacco comment: about a pack a week. social  Vaping Use  . Vaping Use: Never used  Substance and Sexual Activity  . Alcohol use: Yes    Alcohol/week: 42.0 standard drinks    Types: 42 Cans of beer per week    Comment: 24 a week  . Drug use: Yes    Frequency: 7.0 times per week    Types: Marijuana    Comment: daily  . Sexual activity: Yes    Partners: Female  Other Topics Concern  . Not on file  Social History Narrative   ** Merged History Encounter **       Social Determinants of Health   Financial Resource Strain:   . Difficulty of Paying Living Expenses: Not on file  Food Insecurity:   . Worried About Programme researcher, broadcasting/film/video in the Last Year: Not on file  . Ran Out of Food in the Last Year: Not on file  Transportation Needs: No Transportation Needs  . Lack of Transportation (Medical): No  . Lack of Transportation (Non-Medical): No  Physical Activity: Sufficiently Active  . Days of Exercise per Week: 7 days  . Minutes of Exercise per Session: 30 min  Stress:   . Feeling of Stress : Not on file  Social Connections:   . Frequency of Communication with Friends and Family: Not on file  . Frequency of Social Gatherings with Friends and Family: Not on file  . Attends Religious Services: Not on file  . Active Member of Clubs or Organizations: Not on file  . Attends Banker Meetings: Not on file  . Marital Status: Not on file  Intimate Partner Violence:    . Fear of Current or Ex-Partner: Not on file  . Emotionally Abused: Not on file  . Physically Abused: Not on file  . Sexually Abused: Not on file    Physical Exam Cardiovascular:     Rate and Rhythm: Normal rate and regular rhythm.     Pulses: Normal pulses.  Pulmonary:     Effort: Pulmonary effort is normal.     Breath sounds: Normal breath sounds.  Musculoskeletal:        General: Normal range of motion.     Right lower leg: Edema present.     Left lower leg: Edema present.  Skin:    General: Skin is warm and dry.  Capillary Refill: Capillary refill takes less than 2 seconds.  Neurological:     Mental Status: He is alert and oriented to person, place, and time.  Psychiatric:        Mood and Affect: Mood normal.         Future Appointments  Date Time Provider Department Center  05/19/2020  8:55 AM [facility] DEVICE REMOTES [facility]  06/07/2020  1:00 PM Sheilah Pigeon, PA-C [facility]  06/29/2020  7:20 AM [facility] DEVICE REMOTES [facility]  09/02/2020 11:00 AM [facility] 1 [facility]  09/02/2020 12:00 PM [name], MD [facility] None    BP 99/60 (BP Location: Left Arm, Patient Position: Sitting, Cuff Size: Large)   Pulse 64   Resp 16   Wt (!) 327 lb (148.3 kg)   SpO2 97%   BMI 39.80 kg/m   Weight yesterday-326 lb Last visit weight- 330 lb  Mr Sundt was seen at home today and reported feeling well. He denied chest pain, SOB, headache, dizziness, orthopnea, fever or cough over the past two weeks. He stated he has been compliant with his medications over the past week and his weight has been stable. His medications were verified but he was nearly out of Xarelto. Upon checking on this I noted that his co-pay had increased significantly. I contacted the HF clinic and they were able to advise he has gone into the coverage gap for the year. I will obtain samples and help get him signed up for patient assistance  for this medication. He was understanding and agreeable. I will follow up early next week.   Jacqualine Code, EMT 05/14/20  ACTION: Home visit completed Next visit planned for 1 week

## 2020-05-14 NOTE — Telephone Encounter (Signed)
Spoke with Ian Malkin (Paramedicine) regarding the patient's Xarelto today. He has hit the coverage gap and a 30 day supply is now $135.00. Started an application for [employer]. Timothy Lasso is going to get the patient to sign the application and provide proof of income. We already have his OOP expense to send with it.  Three weeks of samples provided as well.   LOT [id number] EXP 11/21  Will fax the application back once received. Will follow up.

## 2020-05-19 ENCOUNTER — Ambulatory Visit (INDEPENDENT_AMBULATORY_CARE_PROVIDER_SITE_OTHER): Payer: Medicare Other

## 2020-05-19 DIAGNOSIS — I5022 Chronic systolic (congestive) heart failure: Secondary | ICD-10-CM

## 2020-05-19 DIAGNOSIS — Z95810 Presence of automatic (implantable) cardiac defibrillator: Secondary | ICD-10-CM | POA: Diagnosis not present

## 2020-05-19 NOTE — Telephone Encounter (Signed)
Sent in application via fax.  Will follow up.  

## 2020-05-21 NOTE — Progress Notes (Signed)
EPIC Encounter for ICM Monitoring  Patient Name: [name] [name] is a 59 y.o. male Date: 05/21/2020 Primary Care Physican: Arvilla Market, DO Primary Cardiologist:[name] Electrophysiologist:[name] Bi-V Pacing:84.5% 9/3/2021Weight: 324lbs   Spoke with patient and reports feeling well at this time. Denies fluid symptoms.   OptivolThoracic impedancenormal.  Prescribed:  Torsemide20 mg 3 tablets (60 mg total) every morning and 2 tablets (40 mg total) every evening.  Potassium 2 tablets (40 mEq total) twice a day.  Spironolactone 25 mg take 1 tablet daily.  Labs: 01/20/2020 Creatinine 1.22, BUN 22, Potassium 4.6, Sodium 138, GFR >60 12/15/2019 Creatinine 1.05, BUN 11, Potassium 4.1 Sodium 142, GFR >60 12/04/2019 Creatinine 1.40, BUN 19, Potassium 4.6, Sodium 138, GFR 56->60 11/22/2019 Creatinine 1.02, BUN 13, Potassium 4.3, Sodium 138, GFR >60 A complete set of results can be found in Results Review.  Recommendations:No changes and encouraged to call if experiencing any fluid symptoms.  Follow-up plan: ICM clinic phone appointment on10/12/2019. 91 day device clinic remote transmission10/08/2020.   EP/Cardiology Office Visits: 06/07/2020 with Francis Dowse, PA.   09/02/2020 with Dr Shirlee Latch.    Copy of ICM check sent to Dr. Graciela Husbands.   3 month ICM trend: 05/19/2020    1 Year ICM trend:       [name], RN 05/21/2020 2:06 PM

## 2020-05-26 ENCOUNTER — Other Ambulatory Visit (HOSPITAL_COMMUNITY): Payer: Self-pay

## 2020-05-26 NOTE — Progress Notes (Signed)
Paramedicine Encounter    Patient ID: [name], male    DOB: [birth year], 59 y.o.   MRN: [id number]   Patient Care Team: Arvilla Market, DO as PCP - General (Family Medicine) [name], [name], MD as PCP - Advanced Heart Failure (Cardiology) [name], MD as PCP - Electrophysiology (Cardiology) [name], MD as PCP - Cardiology (Cardiology) [name], MD (Family Medicine) [name], LCSW as Social Worker (Licensed Clinical Social Worker)  Patient Active Problem List   Diagnosis Date Noted  . Ventricular tachycardia (HCC) 04/17/2020  . Primary osteoarthritis of right knee 01/30/2020  . Hypokalemia 11/23/2019  . Primary osteoarthritis of right hip 11/03/2018  . VT (ventricular tachycardia) (HCC) 05/30/2017  . AKI (acute kidney injury) (HCC)   . Obesity 12/25/2016  . OSA (obstructive sleep apnea) 12/25/2016  . Hyperglycemia 12/25/2016  . Acute respiratory failure with hypoxia (HCC) 12/25/2016  . Chronic anticoagulation 06/27/2016  . Encounter for therapeutic drug monitoring 04/19/2015  . S/P ICD (internal cardiac defibrillator) procedure Medtronic CRT ICD 04/15/2015  . Atrial fibrillation with RVR - Paroxysmal 04/08/2015  . NICM 04/06/2015  . Troponin level elevated 04/06/2015  . Rectal bleeding 10/26/2011  . Hydrocele, left 05/24/2011  . Tobacco use disorder- 1/2 pk a day   . Alcohol abuse   . Obesity 09/27/2008  . CAD- minor CAD 2009 and 2016 09/27/2008  . VENTRICULAR TACHYCARDIA 09/27/2008  . Essential hypertension 06/19/2008  . Chronic systolic heart failure (HCC) 06/19/2008    Current Outpatient Medications:  .  acetaminophen (TYLENOL) 325 MG tablet, Take 650 mg by mouth every 6 (six) hours as needed for headache (pain)., Disp: , Rfl:  .  albuterol (VENTOLIN HFA) 108 (90 Base) MCG/ACT inhaler, Inhale 1 puff into the lungs every 6 (six) hours as needed for wheezing or shortness of breath., Disp: 18 g, Rfl: 3 .  amiodarone  (PACERONE) 200 MG tablet, Take 1 tablet (200 mg total) by mouth daily. Starting 05-19-2020 (Patient taking differently: Take 200 mg by mouth 2 (two) times daily. Starting 05-19-2020), Disp: 60 tablet, Rfl: 3 .  Ascorbic Acid (VITAMIN C) 100 MG tablet, Take 100 mg by mouth daily., Disp: , Rfl:  .  atorvastatin (LIPITOR) 80 MG tablet, Take 1 tablet (80 mg total) by mouth every evening., Disp: 180 tablet, Rfl: 3 .  Bioflavonoid Products (BIOFLEX PO), Take 2 tablets by mouth daily., Disp: , Rfl:  .  docusate sodium (COLACE) 100 MG capsule, Take 1 capsule (100 mg total) by mouth 2 (two) times daily. (Patient not taking: Reported on 05/14/2020), Disp: 30 capsule, Rfl: 0 .  mirtazapine (REMERON) 30 MG tablet, Take 0.5 tablets (15 mg total) by mouth at bedtime., Disp: 90 tablet, Rfl: 1 .  oxyCODONE-acetaminophen (PERCOCET/ROXICET) 5-325 MG tablet, Take 1-2 tablets by mouth every 6 (six) hours as needed for severe pain. (Patient not taking: Reported on 05/14/2020), Disp: 40 tablet, Rfl: 0 .  potassium chloride SA (KLOR-CON) 20 MEQ tablet, Take 2 tablets (40 mEq total) by mouth 2 (two) times daily., Disp: 240 tablet, Rfl: 3 .  rivaroxaban (XARELTO) 20 MG TABS tablet, Take 1 tablet (20 mg total) by mouth daily with supper., Disp: 30 tablet, Rfl: 6 .  sacubitril-valsartan (ENTRESTO) 24-26 MG, Take 1 tablet by mouth 2 (two) times daily., Disp: 60 tablet, Rfl: 3 .  sotalol (BETAPACE) 80 MG tablet, Take 1 tablet (80 mg total) by mouth every 12 (twelve) hours., Disp: 60 tablet, Rfl: 6 .  spironolactone (ALDACTONE)  25 MG tablet, Take 25 mg by mouth daily., Disp: , Rfl:  .  tiZANidine (ZANAFLEX) 2 MG tablet, Take 1 tablet (2 mg total) by mouth every 8 (eight) hours as needed for muscle spasms. (Patient not taking: Reported on 05/14/2020), Disp: 40 tablet, Rfl: 0 .  torsemide (DEMADEX) 20 MG tablet, Take 3 tablets (60 mg total) by mouth every morning AND 2 tablets (40 mg total) every evening., Disp: 300 tablet, Rfl: 3 .   triamcinolone cream (KENALOG) 0.1 %, Apply 1 application topically 2 (two) times daily., Disp: 80 g, Rfl: 1 No Known Allergies    Social History   Socioeconomic History  . Marital status: Divorced    Spouse name: Not on file  . Number of children: 2  . Years of education: Not on file  . Highest education level: Not on file  Occupational History  . Occupation: Copy  . Occupation: disabled, odd jobs  Tobacco Use  . Smoking status: Former Smoker    Packs/day: 0.50    Years: 4.00    Pack years: 2.00    Types: Cigarettes    Quit date: 10/29/2005    Years since quitting: 14.5  . Smokeless tobacco: Never Used  . Tobacco comment: about a pack a week. social  Vaping Use  . Vaping Use: Never used  Substance and Sexual Activity  . Alcohol use: Yes    Alcohol/week: 42.0 standard drinks    Types: 42 Cans of beer per week    Comment: 24 a week  . Drug use: Yes    Frequency: 7.0 times per week    Types: Marijuana    Comment: daily  . Sexual activity: Yes    Partners: Female  Other Topics Concern  . Not on file  Social History Narrative   ** Merged History Encounter **       Social Determinants of Health   Financial Resource Strain:   . Difficulty of Paying Living Expenses: Not on file  Food Insecurity:   . Worried About Programme researcher, broadcasting/film/video in the Last Year: Not on file  . Ran Out of Food in the Last Year: Not on file  Transportation Needs: No Transportation Needs  . Lack of Transportation (Medical): No  . Lack of Transportation (Non-Medical): No  Physical Activity: Sufficiently Active  . Days of Exercise per Week: 7 days  . Minutes of Exercise per Session: 30 min  Stress:   . Feeling of Stress : Not on file  Social Connections:   . Frequency of Communication with Friends and Family: Not on file  . Frequency of Social Gatherings with Friends and Family: Not on file  . Attends Religious Services: Not on file  . Active Member of Clubs or Organizations: Not on file   . Attends Banker Meetings: Not on file  . Marital Status: Not on file  Intimate Partner Violence:   . Fear of Current or Ex-Partner: Not on file  . Emotionally Abused: Not on file  . Physically Abused: Not on file  . Sexually Abused: Not on file    Physical Exam Cardiovascular:     Rate and Rhythm: Normal rate and regular rhythm.     Pulses: Normal pulses.  Pulmonary:     Effort: Pulmonary effort is normal.     Breath sounds: Normal breath sounds.  Musculoskeletal:        General: Normal range of motion.     Right lower leg: No edema.  Left lower leg: No edema.  Skin:    General: Skin is warm and dry.     Capillary Refill: Capillary refill takes less than 2 seconds.  Neurological:     Mental Status: He is alert and oriented to person, place, and time.  Psychiatric:        Mood and Affect: Mood normal.         Future Appointments  Date Time Provider Department Center  06/07/2020  1:00 PM Sheilah Pigeon, PA-C [facility]  06/21/2020 11:45 AM [facility] DEVICE REMOTES [facility]  06/29/2020  7:20 AM [facility] DEVICE REMOTES [facility]  09/02/2020 11:00 AM [facility] OP 1 [facility]  09/02/2020 12:00 PM [name], MD [facility] None    BP (!) 91/51 (BP Location: Left Arm, Patient Position: Sitting, Cuff Size: Large)   Pulse 77   Resp 16   Wt (!) 325 lb (147.4 kg)   SpO2 96%   BMI 39.56 kg/m   Weight yesterday- 326 lb Last visit weight- 327 lb  Mr Polyak was seen at home today and reported feeling well. He denied chest pain, SOB, headache, dizziness, orthopnea, fever or cough over the past week. He stated he has been compliant with his medications over the past week and his weight has been stable. His medications were verified and his pillbox was refilled. He is awaiting approval for assistance from [employer] for Xarelto assistance but will be appropriate for discharge when that happens.   Jacqualine Code, EMT 05/26/20  ACTION: Home visit completed Next visit planned for 2 weeks

## 2020-05-27 NOTE — Telephone Encounter (Signed)
[employer] sent in a denial notification for Xarelto assistance. I called to inquire the denial reason. The representative stated that the electronic income verification checker came back with an income of $40,000 for the patient. The documentation that was sent in, reflects about half that. The patient would have to call in to state that the income sent in with the application is correct, and the electronic income information is incorrect.  I called and advised the patient, provided the phone number to [employer]. He is going to call them as requested.  Will follow up with [employer].

## 2020-06-04 ENCOUNTER — Other Ambulatory Visit: Payer: Self-pay | Admitting: *Deleted

## 2020-06-04 NOTE — Progress Notes (Signed)
Cardiology Office Note Date:  06/04/2020  Patient ID:  [name] [name], DOB Jul 14, 1961, MRN [id number] PCP:  Arvilla Market, DO  Cardiologist:  Dr. Antoine Poche CHF: Dr. Shirlee Latch Electrophysiologist: Dr. Graciela Husbands   Chief Complaint:  planned f/u  History of Present Illness: [name] [name] is a 59 y.o. male with history of chronic systolic CHF (EF 63% in April 2018 . 20-25% Dec 2020), dilated NICM s/p bi V ICD, PAF, ETOH abuse, tobacco abuse, OSA on CPAP, and HTN.  He was admitted to [facility] 05/30/17 for VT/ICD shocks. He reported having a usual day, nothing unusual and reports compliance with all of his medicines and specifically he says this regime has kept his fluid levels good and has kept up with them, happy with the current regime.  He went to bed last night as usual, woke about 10PM feeling SOB, became lightheaded seated at the edge of his bed then diaphoretic, leaned over to steady himself on the wall/window across from him and got shocked, then again, leaned back and the next thing he knew was waking up.  He was aware of 1 or 2 shocks, not all 4.  He was started on sotalol in effort to reduce his DFTs.  Dr. Graciela Husbands noted discussion regarding ETOH, also mentioned COPD and anatomically heart discplaced posteriorly with increased air between device and heart, because of this, discussed with the patient adding new RV lead with SVC coil (rather then coil > can), and repeating DFT testing on sotalol, the patient preferred to proceed with ETOH cessation and sotalol without further for now.  Hospitalized 04/17/2020 with recurrent VT and device shock, he reported compliance with his sotalol, amiodarone was started. He was symptomatic with the VT, felt off and as he was getting weak was shocked.  This happened twice with the same symptoms He had drank about 6 beers that day, had worked heavily in the garage earlier as well. He was discharged after 24hours or so of amio gtt with no VT noted and discharged on  amiodarone 200mg  BID with his sotalol.  I saw him 05/05/20 He feels well. No dizzy spells, near syncope or syncope, no more shocks He denies any CP, palpitations No rest SOB, minimal DOE Denies symptoms of orthopnea or PND No bleeding or signs of bleeding No VT noted Instructed to reduce his amiodarone to daily after 2 more weeks and see Dr. 05/07/20 in 3 mo. Labs updated He had HF team f/u in place   TODAY He reports one day since being on the amiodarone daily, he had about a 1/2 hour that he felt a bit weak, lightheaded and though he may be in VT, took an additional amiodarone that day. No chock, no near syncope or syncope.  No palpitations No CP He does not think he is retaining fluid. No bleeding or signs of bleeding He reports his home weights 324-326lbs and stable   Device information MDT CRT-D, implanted 04/14/15, Dr. 04/16/15 + known PMVT hx (and AF) * h/o appropriate therapies  AAD  05/30/17 had VT failed 3 ATPs, third accelerated VT to VF, 3 failed shocks at max output 4th restored SR 04/17/20, VT w/apprpriate shocks amiodarone AND sotalol are current  Past Medical History:  Diagnosis Date  . AICD (automatic cardioverter/defibrillator) present   . Arthritis   . Atrial fibrillation (HCC)   . CAD (coronary artery disease)    Mild nonobstructive (Cath 09)  . Chronic systolic heart failure (HCC)    Nonischemic CM: echo 4/12 with  EF 25-30%, grade 2 diast dysfxn, mild dilated aortic root 43 mm, trivial MR, mod LAE  . COPD (chronic obstructive pulmonary disease) (HCC)   . Dyspnea   . Ejection fraction < 50% 08/2019   20-25% noted on ECHO  . H/O ETOH abuse   . HTN (hypertension)   . Hypersomnia   . Nonischemic cardiomyopathy (HCC)   . Obesity, morbid (HCC)   . OSA on CPAP   . Systolic and diastolic CHF, acute on chronic (HCC) 11/2016  . Tobacco use disorder   . Ventricular tachycardia [facility])     Past Surgical History:  Procedure Laterality Date  . CARDIAC  CATHETERIZATION N/A 04/08/2015   Procedure: Right/Left Heart Cath and Coronary Angiography;  Surgeon: Corky Crafts, MD;  Location: [facility] INVASIVE CV LAB;  Service: Cardiovascular;  Laterality: N/A;  . EP IMPLANTABLE DEVICE N/A 04/14/2015   Procedure: BiV ICD Insertion CRT-D;  Surgeon: [name], MD;  Location: [facility] INVASIVE CV LAB;  Service: Cardiovascular;  Laterality: N/A;  . HYDROCELE EXCISION / REPAIR    . ICD IMPLANT    . TOTAL HIP ARTHROPLASTY Right 11/04/2018   Procedure: TOTAL HIP ARTHROPLASTY ANTERIOR APPROACH;  Surgeon: [name], MD;  Location: [facility] ORS;  Service: Orthopedics;  Laterality: Right;  . TOTAL KNEE ARTHROPLASTY Right 01/30/2020   Procedure: TOTAL KNEE ARTHROPLASTY;  Surgeon: [name], MD;  Location: [facility] ORS;  Service: Orthopedics;  Laterality: Right;  . TUMOR EXCISION Left   . 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 VASECTOMY      Current Outpatient Medications  Medication Sig Dispense Refill  . acetaminophen (TYLENOL) 325 MG tablet Take 650 mg by mouth every 6 (six) hours as needed for headache (pain).    Marland Kitchen albuterol (VENTOLIN HFA) 108 (90 Base) MCG/ACT inhaler Inhale 1 puff into the lungs every 6 (six) hours as needed for wheezing or shortness of breath. 18 g 3  . amiodarone (PACERONE) 200 MG tablet Take 1 tablet (200 mg total) by mouth daily. Starting 05-19-2020 (Patient taking differently: Take 200 mg by mouth 2 (two) times daily. Starting 05-19-2020) 60 tablet 3  . Ascorbic Acid (VITAMIN C) 100 MG tablet Take 100 mg by mouth daily.    Marland Kitchen atorvastatin (LIPITOR) 80 MG tablet Take 1 tablet (80 mg total) by mouth every evening. 180 tablet 3  . Bioflavonoid Products (BIOFLEX PO) Take 2 tablets by mouth daily.    Marland Kitchen docusate sodium (COLACE) 100 MG capsule Take 1 capsule (100 mg total) by mouth 2 (two) times daily. (Patient not taking: Reported on 05/14/2020) 30 capsule 0  . mirtazapine (REMERON) 30 MG tablet Take 0.5 tablets (15 mg total) by mouth  at bedtime. 90 tablet 1  . oxyCODONE-acetaminophen (PERCOCET/ROXICET) 5-325 MG tablet Take 1-2 tablets by mouth every 6 (six) hours as needed for severe pain. (Patient not taking: Reported on 05/14/2020) 40 tablet 0  . potassium chloride SA (KLOR-CON) 20 MEQ tablet Take 2 tablets (40 mEq total) by mouth 2 (two) times daily. 240 tablet 3  . rivaroxaban (XARELTO) 20 MG TABS tablet Take 1 tablet (20 mg total) by mouth daily with supper. 30 tablet 6  . sacubitril-valsartan (ENTRESTO) 24-26 MG Take 1 tablet by mouth 2 (two) times daily. 60 tablet 3  . sotalol (BETAPACE) 80 MG tablet Take 1 tablet (80 mg total) by mouth every 12 (twelve) hours. 60 tablet 6  . spironolactone (ALDACTONE) 25 MG tablet Take 25 mg by mouth  daily.    . tiZANidine (ZANAFLEX) 2 MG tablet Take 1 tablet (2 mg total) by mouth every 8 (eight) hours as needed for muscle spasms. (Patient not taking: Reported on 05/14/2020) 40 tablet 0  . torsemide (DEMADEX) 20 MG tablet Take 3 tablets (60 mg total) by mouth every morning AND 2 tablets (40 mg total) every evening. 300 tablet 3  . triamcinolone cream (KENALOG) 0.1 % Apply 1 application topically 2 (two) times daily. 80 g 1   No current facility-administered medications for this visit.    Allergies:   Patient has no known allergies.   Social History:  The patient  reports that he quit smoking about 14 years ago. His smoking use included cigarettes. He has a 2.00 pack-year smoking history. He has never used smokeless tobacco. He reports current alcohol use of about 42.0 standard drinks of alcohol per week. He reports current drug use. Frequency: 7.00 times per week. Drug: Marijuana.   Family History:  The patient's family history includes Congestive Heart Failure in his father; Coronary artery disease in an other family member; Healthy in his brother, daughter, father, sister, sister, son, and son; Heart Problems in his mother; Other in his mother.  ROS:  Please see the history of present  illness.  All other systems are reviewed and otherwise negative.   PHYSICAL EXAM:  VS:  There were no vitals taken for this visit. BMI: There is no height or weight on file to calculate BMI. Well nourished, well developed, in no acute distress  HEENT: normocephalic, atraumatic  Neck: no JVD, carotid bruits or masses Cardiac:  RRR; no significant murmurs, no rubs, or gallops Lungs:  CTA b/l, no wheezing, rhonchi or rales  Abd: soft, nontender, obese MS: no deformity or atrophy Ext: no edema  Skin: warm and dry, no rash Neuro:  No gross deficits appreciated Psych: euthymic mood, full affect  ICD site is stable, no tethering or discomfort   EKG:  Not done today  ICD interrogation done today and reviewed by myself:  Battery and lead measurements are good No VT, no HVR LV lead thresholds done with ECG support, very difficult to establish by EGMs Trigger pacing was clearly narrower QRS     09/16/2019: TTE IMPRESSIONS  1. Left ventricular ejection fraction, by visual estimation, is 20 to  25%. The left ventricle has severely decreased function. There is  moderately increased left ventricular hypertrophy.  2. Left ventricular diastolic parameters are consistent with Grade II  diastolic dysfunction (pseudonormalization).  3. Severely dilated left ventricular internal cavity size.  4. The left ventricle demonstrates global hypokinesis.  5. Global right ventricle has normal systolic function.The right  ventricular size is normal. No increase in right ventricular wall  thickness.  6. A pacer wire is visualized in the RV.  7. Left atrial size was moderately dilated.  8. Right atrial size was mildly dilated.  9. The mitral valve is normal in structure. No evidence of mitral valve  regurgitation. No evidence of mitral stenosis.  10. The tricuspid valve is normal in structure.  11. The aortic valve is tricuspid. Aortic valve regurgitation is not  visualized. No evidence of  aortic valve sclerosis or stenosis.  12. TR signal is inadequate for assessing pulmonary artery systolic  pressure.  13. The inferior vena cava is normal in size with greater than 50%  respiratory variability, suggesting right atrial pressure of 3 mmHg.    12/26/16: TTE Study Conclusions - Left ventricle: The cavity size was  moderately dilated. Systolic function was severely reduced. The estimated ejection fraction was 15%. Diffuse hypokinesis. Doppler parameters are consistent with restrictive physiology, indicative of decreased left ventricular diastolic compliance and/or increased left atrial pressure. - Aortic root: The aortic root was mildly dilated. - Mitral valve: Calcified annulus. There was mild regurgitation. - Left atrium: The atrium was severely dilated. - Pericardium, extracardiac: A trivial pericardial effusion was identified. Impressions: - Technically difficult; severe global reduction in LV systolic function; restrictive filling; mildly dilated aortic root; mild MR; severe LAE  04/08/15: LHC  Nonobstructive coronary artery disease.  Elevated LVEDP  Moderate pulmonary artery hypertension.  Cardiac output 5.5 L/min; CI 2.1.   Recent Labs: 04/17/2020: B Natriuretic Peptide 92.6; Hemoglobin 11.7; Platelets 196 05/05/2020: ALT 10; Magnesium 2.5; TSH 1.240 05/10/2020: BUN 15; Creatinine, Ser 1.27; Potassium 4.1; Sodium 136  No results found for requested labs within last 8760 hours.   CrCl cannot be calculated (Patient's most recent lab result is older than the maximum 21 days allowed.).   Wt Readings from Last 3 Encounters:  05/26/20 (!) 325 lb (147.4 kg)  05/14/20 (!) 327 lb (148.3 kg)  05/10/20 (!) 330 lb 3.2 oz (149.8 kg)     Other studies reviewed: Additional studies/records reviewed today include: summarized above  ASSESSMENT AND PLAN:  1. VT     None further     He is reminded today no driving for 6 months  (from 04/17/20)      amiodarone AND sotalol     QT looks OK     Labs are up to date  2. NICM     No symptoms or exam fidnings of volume OL     OptiVol looks good     Record notes intolerant of coreg with hypotension       3. ICD     Intact function, LV lead  Capture management was turned off, noting has been unable to run thresholds since march (lkely 2/2 competing rates)     LV output fixed 3.0V/0.40ms      He is programmed (by SK at a prior hospital stay using ECG optimization) RV lead is programmed subtherapeutic LV lead is programmed  Paced AV delay (not to pace) And sensed AV (essentially VS response pacing) His QRS duration with this programming was the best Today (unchanged)   4. Paroxysmal AFib     CHA2DS2Vasc is at least 2, on xarelto, appropriately dosed     0% burden  5. HTN     Looks ok   Disposition: we will do remotes Q 65mo, he has an appt to see Dr. Shirlee Latch in a few months, will have him see Dr. Graciela Husbands in 4mo, sooner if needed  Current medicines are reviewed at length with the patient today.  The patient did not have any concerns regarding medicines.  Norma Fredrickson, PA-C 06/04/2020 8:58 AM     [facility] [address] [place] [address] [phone] (office)  [phone] (fax)

## 2020-06-07 ENCOUNTER — Ambulatory Visit (INDEPENDENT_AMBULATORY_CARE_PROVIDER_SITE_OTHER): Payer: Medicare Other | Admitting: Physician Assistant

## 2020-06-07 ENCOUNTER — Other Ambulatory Visit: Payer: Self-pay

## 2020-06-07 ENCOUNTER — Other Ambulatory Visit: Payer: Self-pay | Admitting: *Deleted

## 2020-06-07 VITALS — BP 118/62 | HR 66 | Ht 76.0 in | Wt 330.0 lb

## 2020-06-07 DIAGNOSIS — I10 Essential (primary) hypertension: Secondary | ICD-10-CM

## 2020-06-07 DIAGNOSIS — I480 Paroxysmal atrial fibrillation: Secondary | ICD-10-CM

## 2020-06-07 DIAGNOSIS — I5022 Chronic systolic (congestive) heart failure: Secondary | ICD-10-CM | POA: Diagnosis not present

## 2020-06-07 DIAGNOSIS — I4891 Unspecified atrial fibrillation: Secondary | ICD-10-CM | POA: Diagnosis not present

## 2020-06-07 DIAGNOSIS — I472 Ventricular tachycardia, unspecified: Secondary | ICD-10-CM

## 2020-06-07 DIAGNOSIS — Z95810 Presence of automatic (implantable) cardiac defibrillator: Secondary | ICD-10-CM | POA: Diagnosis not present

## 2020-06-07 DIAGNOSIS — I428 Other cardiomyopathies: Secondary | ICD-10-CM

## 2020-06-07 NOTE — Patient Instructions (Addendum)
Medication Instructions:   *If you need a refill on your cardiac medications before your next appointment, please call your pharmacy*   Lab Work: NONE ORDERED  TODAY   If you have labs (blood work) drawn today and your tests are completely normal, you will receive your results only by: Marland Kitchen MyChart Message (if you have MyChart) OR . A paper copy in the mail If you have any lab test that is abnormal or we need to change your treatment, we will call you to review the results.   Testing/Procedures: NONE ORDERED  TODAY    Follow-Up: At [facility], you and your health needs are our priority.  As part of our continuing mission to provide you with exceptional heart care, we have created designated Provider Care Teams.  These Care Teams include your primary Cardiologist (physician) and Advanced Practice Providers (APPs -  Physician Assistants and Nurse Practitioners) who all work together to provide you with the care you need, when you need it.  We recommend signing up for the patient portal called "MyChart".  Sign up information is provided on this After Visit Summary.  MyChart is used to connect with patients for Virtual Visits (Telemedicine).  Patients are able to view lab/test results, encounter notes, upcoming appointments, etc.  Non-urgent messages can be sent to your provider as well.   To learn more about what you can do with MyChart, go to [email or web address].    Your next appointment:   6 month(s)  The format for your next appointment:   In Person  Provider:   You may see Sherryl Manges, MD or one of the following Advanced Practice Providers on your designated Care Team:    Gypsy Balsam, NP  Francis Dowse, [place]  Casimiro Needle "Otilio Saber, [place]    Other Instructions

## 2020-06-10 ENCOUNTER — Other Ambulatory Visit (HOSPITAL_COMMUNITY): Payer: Self-pay | Admitting: *Deleted

## 2020-06-10 ENCOUNTER — Other Ambulatory Visit (HOSPITAL_COMMUNITY): Payer: Self-pay

## 2020-06-10 MED ORDER — SPIRONOLACTONE 25 MG PO TABS: 25.0000 mg | ORAL_TABLET | Freq: Every day | ORAL | 3 refills | Status: DC

## 2020-06-10 MED ORDER — ENTRESTO 24-26 MG PO TABS: 1.0000 | ORAL_TABLET | Freq: Two times a day (BID) | ORAL | 3 refills | Status: DC

## 2020-06-10 NOTE — Progress Notes (Signed)
Paramedicine Encounter    Patient ID: [name] [name], male    DOB: 09-18-1961, 59 y.o.   MRN: [id number]   Patient Care Team: Arvilla Market, DO as PCP - General (Family Medicine) [name], [name], MD as PCP - Advanced Heart Failure (Cardiology) [name], MD as PCP - Electrophysiology (Cardiology) [name], MD as PCP - Cardiology (Cardiology) [name], MD (Family Medicine) [name], LCSW as Social Worker (Licensed Clinical Social Worker)  Patient Active Problem List   Diagnosis Date Noted  . Ventricular tachycardia (HCC) 04/17/2020  . Primary osteoarthritis of right knee 01/30/2020  . Hypokalemia 11/23/2019  . Primary osteoarthritis of right hip 11/03/2018  . VT (ventricular tachycardia) (HCC) 05/30/2017  . AKI (acute kidney injury) (HCC)   . Obesity 12/25/2016  . OSA (obstructive sleep apnea) 12/25/2016  . Hyperglycemia 12/25/2016  . Acute respiratory failure with hypoxia (HCC) 12/25/2016  . Chronic anticoagulation 06/27/2016  . Encounter for therapeutic drug monitoring 04/19/2015  . S/P ICD (internal cardiac defibrillator) procedure Medtronic CRT ICD 04/15/2015  . Atrial fibrillation with RVR - Paroxysmal 04/08/2015  . NICM 04/06/2015  . Troponin level elevated 04/06/2015  . Rectal bleeding 10/26/2011  . Hydrocele, left 05/24/2011  . Tobacco use disorder- 1/2 pk a day   . Alcohol abuse   . Obesity 09/27/2008  . CAD- minor CAD 2009 and 2016 09/27/2008  . VENTRICULAR TACHYCARDIA 09/27/2008  . Essential hypertension 06/19/2008  . Chronic systolic heart failure (HCC) 06/19/2008    Current Outpatient Medications:  .  acetaminophen (TYLENOL) 325 MG tablet, Take 650 mg by mouth every 6 (six) hours as needed for headache (pain)., Disp: , Rfl:  .  albuterol (VENTOLIN HFA) 108 (90 Base) MCG/ACT inhaler, Inhale 1 puff into the lungs every 6 (six) hours as needed for wheezing or shortness of breath., Disp: 18 g, Rfl: 3 .  amiodarone  (PACERONE) 200 MG tablet, Take 1 tablet (200 mg total) by mouth daily. Starting 05-19-2020 (Patient taking differently: Take 200 mg by mouth 2 (two) times daily. Starting 05-19-2020), Disp: 60 tablet, Rfl: 3 .  Ascorbic Acid (VITAMIN C) 100 MG tablet, Take 100 mg by mouth daily., Disp: , Rfl:  .  atorvastatin (LIPITOR) 80 MG tablet, Take 1 tablet (80 mg total) by mouth every evening., Disp: 180 tablet, Rfl: 3 .  Bioflavonoid Products (BIOFLEX PO), Take 2 tablets by mouth daily., Disp: , Rfl:  .  docusate sodium (COLACE) 100 MG capsule, Take 1 capsule (100 mg total) by mouth 2 (two) times daily., Disp: 30 capsule, Rfl: 0 .  mirtazapine (REMERON) 30 MG tablet, Take 0.5 tablets (15 mg total) by mouth at bedtime., Disp: 90 tablet, Rfl: 1 .  oxyCODONE-acetaminophen (PERCOCET/ROXICET) 5-325 MG tablet, Take 1-2 tablets by mouth every 6 (six) hours as needed for severe pain., Disp: 40 tablet, Rfl: 0 .  potassium chloride SA (KLOR-CON) 20 MEQ tablet, Take 2 tablets (40 mEq total) by mouth 2 (two) times daily., Disp: 240 tablet, Rfl: 3 .  rivaroxaban (XARELTO) 20 MG TABS tablet, Take 1 tablet (20 mg total) by mouth daily with supper., Disp: 30 tablet, Rfl: 6 .  sacubitril-valsartan (ENTRESTO) 24-26 MG, Take 1 tablet by mouth 2 (two) times daily., Disp: 60 tablet, Rfl: 3 .  sotalol (BETAPACE) 80 MG tablet, Take 1 tablet (80 mg total) by mouth every 12 (twelve) hours., Disp: 60 tablet, Rfl: 6 .  spironolactone (ALDACTONE) 25 MG tablet, Take 25 mg by mouth daily., Disp: , Rfl:  .  tiZANidine (ZANAFLEX) 2 MG tablet, Take 1 tablet (2 mg total) by mouth every 8 (eight) hours as needed for muscle spasms., Disp: 40 tablet, Rfl: 0 .  torsemide (DEMADEX) 20 MG tablet, Take 3 tablets (60 mg total) by mouth every morning AND 2 tablets (40 mg total) every evening., Disp: 300 tablet, Rfl: 3 .  triamcinolone cream (KENALOG) 0.1 %, Apply 1 application topically 2 (two) times daily., Disp: 80 g, Rfl: 1 No Known  Allergies    Social History   Socioeconomic History  . Marital status: Divorced    Spouse name: Not on file  . Number of children: 2  . Years of education: Not on file  . Highest education level: Not on file  Occupational History  . Occupation: Copy  . Occupation: disabled, odd jobs  Tobacco Use  . Smoking status: Former Smoker    Packs/day: 0.50    Years: 4.00    Pack years: 2.00    Types: Cigarettes    Quit date: 10/29/2005    Years since quitting: 14.6  . Smokeless tobacco: Never Used  . Tobacco comment: about a pack a week. social  Vaping Use  . Vaping Use: Never used  Substance and Sexual Activity  . Alcohol use: Yes    Alcohol/week: 42.0 standard drinks    Types: 42 Cans of beer per week    Comment: 24 a week  . Drug use: Yes    Frequency: 7.0 times per week    Types: Marijuana    Comment: daily  . Sexual activity: Yes    Partners: Female  Other Topics Concern  . Not on file  Social History Narrative   ** Merged History Encounter **       Social Determinants of Health   Financial Resource Strain:   . Difficulty of Paying Living Expenses: Not on file  Food Insecurity:   . Worried About Programme researcher, broadcasting/film/video in the Last Year: Not on file  . Ran Out of Food in the Last Year: Not on file  Transportation Needs: No Transportation Needs  . Lack of Transportation (Medical): No  . Lack of Transportation (Non-Medical): No  Physical Activity: Sufficiently Active  . Days of Exercise per Week: 7 days  . Minutes of Exercise per Session: 30 min  Stress:   . Feeling of Stress : Not on file  Social Connections:   . Frequency of Communication with Friends and Family: Not on file  . Frequency of Social Gatherings with Friends and Family: Not on file  . Attends Religious Services: Not on file  . Active Member of Clubs or Organizations: Not on file  . Attends Banker Meetings: Not on file  . Marital Status: Not on file  Intimate Partner Violence:    . Fear of Current or Ex-Partner: Not on file  . Emotionally Abused: Not on file  . Physically Abused: Not on file  . Sexually Abused: Not on file    Physical Exam Cardiovascular:     Rate and Rhythm: Normal rate and regular rhythm.     Pulses: Normal pulses.  Pulmonary:     Effort: Pulmonary effort is normal.     Breath sounds: Examination of the right-lower field reveals rales. Examination of the left-lower field reveals rales. Rales present.  Musculoskeletal:        General: Normal range of motion.     Right lower leg: Edema present.     Left lower leg: Edema present.  Skin:    General: Skin is warm and dry.     Capillary Refill: Capillary refill takes less than 2 seconds.  Neurological:     Mental Status: He is alert and oriented to person, place, and time.  Psychiatric:        Mood and Affect: Mood normal.         Future Appointments  Date Time Provider Department Center  06/21/2020 11:45 AM [facility] DEVICE REMOTES [facility]  06/29/2020  7:20 AM [facility] DEVICE REMOTES [facility]  09/02/2020 11:00 AM [facility] ECHO OP 1 [facility]  09/02/2020 12:00 PM [name], MD [facility] None    BP 108/70 (BP Location: Left Arm, Patient Position: Sitting, Cuff Size: Normal)   Pulse 76   Resp 16   Wt (!) 332 lb (150.6 kg)   SpO2 97%   BMI 40.41 kg/m   Weight yesterday- 326 lb Last visit weight- 330 lb  Mr Archila was seen at home today and reported feeling generally well. He denied chest pain, Headache, dizziness, headache or cough over the past two weeks but did report orthopnea and exertional dyspnea. His weight was elevated, he had bilateral lower extremity edema and bilateral basilar rales. I contacted the HF clinic and had his Optivol transmitted. Per Robbie Lis, PA-C, he was given an extra 20 mg of torsemide tonight. His medications were verified and his pillbox was refilled. All necessary refills were ordered. I will follow up  next week.   Jacqualine Code, EMT 06/10/20  ACTION: Home visit completed Next visit planned for 1 week

## 2020-06-10 NOTE — Telephone Encounter (Signed)
Called [employer], they have yet to finish processing the patients application. I requested to speak with a Production designer, theatre/television/film, the representative reached out to a manager on my behalf. The application should be done by the end of the day.  Will follow up tomorrow.

## 2020-06-11 NOTE — Telephone Encounter (Signed)
Advanced Heart Failure Patient Advocate Encounter   Patient was approved to receive Xarelto from [employer]   Effective dates: 06/11/20 through 09/17/20  Pharmacy Billing Information  BIN: [id number]  PCN: (none)  Group: [id number]  ID: [id number]  Will call the patient and update the pharmacy.

## 2020-06-14 ENCOUNTER — Other Ambulatory Visit (HOSPITAL_COMMUNITY): Payer: Self-pay | Admitting: *Deleted

## 2020-06-14 MED ORDER — RIVAROXABAN 20 MG PO TABS: 20.0000 mg | ORAL_TABLET | Freq: Every day | ORAL | 6 refills | Status: DC

## 2020-06-14 NOTE — Telephone Encounter (Signed)
Called and spoke with the patient regarding approval. Called patients pharmacy, gave the card billing information. The pharmacy stated they did not have an RX for that medication. Requested that one be sent over.  Archer Asa, CPhT

## 2020-06-17 ENCOUNTER — Telehealth (HOSPITAL_COMMUNITY): Payer: Self-pay

## 2020-06-17 NOTE — Telephone Encounter (Signed)
I called [name] [name] to see if he was doing well this week. He stated he was feeling fine and was expecting delivery of his medications in the next day or two from [facility]. He advised he did not need anything at this time so I suggested we meet next week. He was agreeable and said he would call if anything should come up before then.   Jacqualine Code, EMT

## 2020-06-21 ENCOUNTER — Ambulatory Visit (INDEPENDENT_AMBULATORY_CARE_PROVIDER_SITE_OTHER): Payer: Medicare Other

## 2020-06-21 DIAGNOSIS — I5022 Chronic systolic (congestive) heart failure: Secondary | ICD-10-CM | POA: Diagnosis not present

## 2020-06-21 DIAGNOSIS — Z95810 Presence of automatic (implantable) cardiac defibrillator: Secondary | ICD-10-CM | POA: Diagnosis not present

## 2020-06-23 NOTE — Progress Notes (Signed)
EPIC Encounter for ICM Monitoring  Patient Name: [name] [name] is a 59 y.o. male Date: 06/23/2020 Primary Care Physican: Arvilla Market, DO Primary Cardiologist:[name] Electrophysiologist:[name] Bi-V Pacing:85.1% 10/6/2021Weight: 328lbs   Spoke with patient and reports feeling well at this time. Denies fluid symptoms.   OptivolThoracic impedancenormal.  Prescribed:  Torsemide20 mg 3 tablets (60 mg total) every morning and 2 tablets (40 mg total) every evening.  Potassium 2 tablets (40 mEq total) twice a day.  Spironolactone 25 mg take 1 tablet daily.  Labs: 01/20/2020 Creatinine 1.22, BUN 22, Potassium 4.6, Sodium 138, GFR >60 12/15/2019 Creatinine 1.05, BUN 11, Potassium 4.1 Sodium 142, GFR >60 12/04/2019 Creatinine 1.40, BUN 19, Potassium 4.6, Sodium 138, GFR 56->60 11/22/2019 Creatinine 1.02, BUN 13, Potassium 4.3, Sodium 138, GFR >60 A complete set of results can be found in Results Review.  Recommendations:No changes and encouraged to call if experiencing any fluid symptoms.  Follow-up plan: ICM clinic phone appointment on 07/26/2020. 91 day device clinic remote transmission10/08/2020.   EP/Cardiology Office Visits:  09/02/2020 withDr Shirlee Latch.   Copy of ICM check sent to Dr.[name].    3 month ICM trend: 06/21/2020    1 Year ICM trend:       [name], RN 06/23/2020 2:00 PM

## 2020-06-25 ENCOUNTER — Other Ambulatory Visit (HOSPITAL_COMMUNITY): Payer: Self-pay

## 2020-06-25 NOTE — Progress Notes (Signed)
Paramedicine Encounter    Patient ID: [name] [name], male    DOB: 11-17-1960, 59 y.o.   MRN: [id number]   Patient Care Team: Arvilla Market, DO as PCP - General (Family Medicine) [name], [name], MD as PCP - Advanced Heart Failure (Cardiology) [name], MD as PCP - Electrophysiology (Cardiology) [name], MD as PCP - Cardiology (Cardiology) [name], MD (Family Medicine) [name], LCSW as Social Worker (Licensed Clinical Social Worker)  Patient Active Problem List   Diagnosis Date Noted  . Ventricular tachycardia (HCC) 04/17/2020  . Primary osteoarthritis of right knee 01/30/2020  . Hypokalemia 11/23/2019  . Primary osteoarthritis of right hip 11/03/2018  . VT (ventricular tachycardia) (HCC) 05/30/2017  . AKI (acute kidney injury) (HCC)   . Obesity 12/25/2016  . OSA (obstructive sleep apnea) 12/25/2016  . Hyperglycemia 12/25/2016  . Acute respiratory failure with hypoxia (HCC) 12/25/2016  . Chronic anticoagulation 06/27/2016  . Encounter for therapeutic drug monitoring 04/19/2015  . S/P ICD (internal cardiac defibrillator) procedure Medtronic CRT ICD 04/15/2015  . Atrial fibrillation with RVR - Paroxysmal 04/08/2015  . NICM 04/06/2015  . Troponin level elevated 04/06/2015  . Rectal bleeding 10/26/2011  . Hydrocele, left 05/24/2011  . Tobacco use disorder- 1/2 pk a day   . Alcohol abuse   . Obesity 09/27/2008  . CAD- minor CAD 2009 and 2016 09/27/2008  . VENTRICULAR TACHYCARDIA 09/27/2008  . Essential hypertension 06/19/2008  . Chronic systolic heart failure (HCC) 06/19/2008    Current Outpatient Medications:  .  acetaminophen (TYLENOL) 325 MG tablet, Take 650 mg by mouth every 6 (six) hours as needed for headache (pain)., Disp: , Rfl:  .  albuterol (VENTOLIN HFA) 108 (90 Base) MCG/ACT inhaler, Inhale 1 puff into the lungs every 6 (six) hours as needed for wheezing or shortness of breath., Disp: 18 g, Rfl: 3 .  amiodarone  (PACERONE) 200 MG tablet, Take 1 tablet (200 mg total) by mouth daily. Starting 05-19-2020 (Patient taking differently: Take 200 mg by mouth 2 (two) times daily. Starting 05-19-2020), Disp: 60 tablet, Rfl: 3 .  Ascorbic Acid (VITAMIN C) 100 MG tablet, Take 100 mg by mouth daily., Disp: , Rfl:  .  atorvastatin (LIPITOR) 80 MG tablet, Take 1 tablet (80 mg total) by mouth every evening., Disp: 180 tablet, Rfl: 3 .  Bioflavonoid Products (BIOFLEX PO), Take 2 tablets by mouth daily. (Patient not taking: Reported on 06/10/2020), Disp: , Rfl:  .  docusate sodium (COLACE) 100 MG capsule, Take 1 capsule (100 mg total) by mouth 2 (two) times daily., Disp: 30 capsule, Rfl: 0 .  mirtazapine (REMERON) 30 MG tablet, Take 0.5 tablets (15 mg total) by mouth at bedtime., Disp: 90 tablet, Rfl: 1 .  oxyCODONE-acetaminophen (PERCOCET/ROXICET) 5-325 MG tablet, Take 1-2 tablets by mouth every 6 (six) hours as needed for severe pain. (Patient not taking: Reported on 06/10/2020), Disp: 40 tablet, Rfl: 0 .  potassium chloride SA (KLOR-CON) 20 MEQ tablet, Take 2 tablets (40 mEq total) by mouth 2 (two) times daily., Disp: 240 tablet, Rfl: 3 .  rivaroxaban (XARELTO) 20 MG TABS tablet, Take 1 tablet (20 mg total) by mouth daily with supper., Disp: 30 tablet, Rfl: 6 .  sacubitril-valsartan (ENTRESTO) 24-26 MG, Take 1 tablet by mouth 2 (two) times daily., Disp: 60 tablet, Rfl: 3 .  sotalol (BETAPACE) 80 MG tablet, Take 1 tablet (80 mg total) by mouth every 12 (twelve) hours., Disp: 60 tablet, Rfl: 6 .  spironolactone (ALDACTONE)  25 MG tablet, Take 1 tablet (25 mg total) by mouth daily., Disp: 90 tablet, Rfl: 3 .  tiZANidine (ZANAFLEX) 2 MG tablet, Take 1 tablet (2 mg total) by mouth every 8 (eight) hours as needed for muscle spasms. (Patient not taking: Reported on 06/10/2020), Disp: 40 tablet, Rfl: 0 .  torsemide (DEMADEX) 20 MG tablet, Take 3 tablets (60 mg total) by mouth every morning AND 2 tablets (40 mg total) every evening., Disp:  300 tablet, Rfl: 3 .  triamcinolone cream (KENALOG) 0.1 %, Apply 1 application topically 2 (two) times daily. (Patient not taking: Reported on 06/10/2020), Disp: 80 g, Rfl: 1 No Known Allergies    Social History   Socioeconomic History  . Marital status: Divorced    Spouse name: Not on file  . Number of children: 2  . Years of education: Not on file  . Highest education level: Not on file  Occupational History  . Occupation: Copy  . Occupation: disabled, odd jobs  Tobacco Use  . Smoking status: Former Smoker    Packs/day: 0.50    Years: 4.00    Pack years: 2.00    Types: Cigarettes    Quit date: 10/29/2005    Years since quitting: 14.6  . Smokeless tobacco: Never Used  . Tobacco comment: about a pack a week. social  Vaping Use  . Vaping Use: Never used  Substance and Sexual Activity  . Alcohol use: Yes    Alcohol/week: 42.0 standard drinks    Types: 42 Cans of beer per week    Comment: 24 a week  . Drug use: Yes    Frequency: 7.0 times per week    Types: Marijuana    Comment: daily  . Sexual activity: Yes    Partners: Female  Other Topics Concern  . Not on file  Social History Narrative   ** Merged History Encounter **       Social Determinants of Health   Financial Resource Strain:   . Difficulty of Paying Living Expenses: Not on file  Food Insecurity:   . Worried About Programme researcher, broadcasting/film/video in the Last Year: Not on file  . Ran Out of Food in the Last Year: Not on file  Transportation Needs: No Transportation Needs  . Lack of Transportation (Medical): No  . Lack of Transportation (Non-Medical): No  Physical Activity: Sufficiently Active  . Days of Exercise per Week: 7 days  . Minutes of Exercise per Session: 30 min  Stress:   . Feeling of Stress : Not on file  Social Connections:   . Frequency of Communication with Friends and Family: Not on file  . Frequency of Social Gatherings with Friends and Family: Not on file  . Attends Religious Services:  Not on file  . Active Member of Clubs or Organizations: Not on file  . Attends Banker Meetings: Not on file  . Marital Status: Not on file  Intimate Partner Violence:   . Fear of Current or Ex-Partner: Not on file  . Emotionally Abused: Not on file  . Physically Abused: Not on file  . Sexually Abused: Not on file    Physical Exam Cardiovascular:     Rate and Rhythm: Normal rate and regular rhythm.     Pulses: Normal pulses.  Pulmonary:     Effort: Pulmonary effort is normal.     Breath sounds: Normal breath sounds.  Musculoskeletal:        General: Normal range of motion.  Right lower leg: Edema present.     Left lower leg: Edema present.  Skin:    General: Skin is warm and dry.     Capillary Refill: Capillary refill takes less than 2 seconds.  Neurological:     Mental Status: He is alert and oriented to person, place, and time.  Psychiatric:        Mood and Affect: Mood normal.         Future Appointments  Date Time Provider Department Center  06/29/2020  7:20 AM [facility] DEVICE REMOTES [facility]  07/08/2020  3:50 PM Arvilla Market, DO [facility] None  07/26/2020 12:15 PM [facility] DEVICE REMOTES [facility]  09/02/2020 11:00 AM [facility] E[facility] OP 1 [facility]  09/02/2020 12:00 PM [name], MD [facility] None    BP (!) 85/45 (BP Location: Left Arm, Patient Position: Sitting, Cuff Size: Large)   Pulse 70   Resp 16   Wt (!) 329 lb (149.2 kg)   SpO2 96%   BMI 40.05 kg/m   Weight yesterday- 328 lb Last visit weight- 332 lb  Mr Schoeller was seen at home today and rpeorted feeling well. He denied chest pain, SOB, headache, dizziness, orthopnea, fever or cough. He stated he has been compliant with his medications and his weight has been stable. He was hypotensive however maintained that he does not have any symptoms reference to the same.  His medications were verified and his pillbox was checked for accuracy. I  will follow up in two weeks.   Jacqualine Code, EMT 06/25/20  ACTION: Home visit completed Next visit planned for 2 weeks

## 2020-07-07 ENCOUNTER — Other Ambulatory Visit (HOSPITAL_COMMUNITY): Payer: Self-pay

## 2020-07-07 NOTE — Progress Notes (Signed)
Paramedicine Encounter    Patient ID: [name] [name], male    DOB: [birth year], 59 y.o.   MRN: [id number]   Patient Care Team: Arvilla Market, DO as PCP - General (Family Medicine) [name], [name], MD as PCP - Advanced Heart Failure (Cardiology) [name], MD as PCP - Electrophysiology (Cardiology) [name], MD as PCP - Cardiology (Cardiology) [name], MD (Family Medicine) [name], LCSW as Social Worker (Licensed Clinical Social Worker)  Patient Active Problem List   Diagnosis Date Noted   Ventricular tachycardia (HCC) 04/17/2020   Primary osteoarthritis of right knee 01/30/2020   Hypokalemia 11/23/2019   Primary osteoarthritis of right hip 11/03/2018   VT (ventricular tachycardia) (HCC) 05/30/2017   AKI (acute kidney injury) (HCC)    Obesity 12/25/2016   OSA (obstructive sleep apnea) 12/25/2016   Hyperglycemia 12/25/2016   Acute respiratory failure with hypoxia (HCC) 12/25/2016   Chronic anticoagulation 06/27/2016   Encounter for therapeutic drug monitoring 04/19/2015   S/P ICD (internal cardiac defibrillator) procedure Medtronic CRT ICD 04/15/2015   Atrial fibrillation with RVR - Paroxysmal 04/08/2015   NICM 04/06/2015   Troponin level elevated 04/06/2015   Rectal bleeding 10/26/2011   Hydrocele, left 05/24/2011   Tobacco use disorder- 1/2 pk a day    Alcohol abuse    Obesity 09/27/2008   CAD- minor CAD 2009 and 2016 09/27/2008   VENTRICULAR TACHYCARDIA 09/27/2008   Essential hypertension 06/19/2008   Chronic systolic heart failure (HCC) 06/19/2008    Current Outpatient Medications:    acetaminophen (TYLENOL) 325 MG tablet, Take 650 mg by mouth every 6 (six) hours as needed for headache (pain)., Disp: , Rfl:    albuterol (VENTOLIN HFA) 108 (90 Base) MCG/ACT inhaler, Inhale 1 puff into the lungs every 6 (six) hours as needed for wheezing or shortness of breath., Disp: 18 g, Rfl: 3   amiodarone  (PACERONE) 200 MG tablet, Take 1 tablet (200 mg total) by mouth daily. Starting 05-19-2020 (Patient taking differently: Take 200 mg by mouth 2 (two) times daily. Starting 05-19-2020), Disp: 60 tablet, Rfl: 3   Ascorbic Acid (VITAMIN C) 100 MG tablet, Take 100 mg by mouth daily., Disp: , Rfl:    atorvastatin (LIPITOR) 80 MG tablet, Take 1 tablet (80 mg total) by mouth every evening., Disp: 180 tablet, Rfl: 3   docusate sodium (COLACE) 100 MG capsule, Take 1 capsule (100 mg total) by mouth 2 (two) times daily., Disp: 30 capsule, Rfl: 0   potassium chloride SA (KLOR-CON) 20 MEQ tablet, Take 2 tablets (40 mEq total) by mouth 2 (two) times daily., Disp: 240 tablet, Rfl: 3   rivaroxaban (XARELTO) 20 MG TABS tablet, Take 1 tablet (20 mg total) by mouth daily with supper., Disp: 30 tablet, Rfl: 6   sacubitril-valsartan (ENTRESTO) 24-26 MG, Take 1 tablet by mouth 2 (two) times daily., Disp: 60 tablet, Rfl: 3   sotalol (BETAPACE) 80 MG tablet, Take 1 tablet (80 mg total) by mouth every 12 (twelve) hours., Disp: 60 tablet, Rfl: 6   spironolactone (ALDACTONE) 25 MG tablet, Take 1 tablet (25 mg total) by mouth daily., Disp: 90 tablet, Rfl: 3   torsemide (DEMADEX) 20 MG tablet, Take 3 tablets (60 mg total) by mouth every morning AND 2 tablets (40 mg total) every evening., Disp: 300 tablet, Rfl: 3   Bioflavonoid Products (BIOFLEX PO), Take 2 tablets by mouth daily. (Patient not taking: Reported on 06/10/2020), Disp: , Rfl:    mirtazapine (REMERON) 30 MG tablet,  Take 0.5 tablets (15 mg total) by mouth at bedtime. (Patient not taking: Reported on 06/25/2020), Disp: 90 tablet, Rfl: 1   oxyCODONE-acetaminophen (PERCOCET/ROXICET) 5-325 MG tablet, Take 1-2 tablets by mouth every 6 (six) hours as needed for severe pain. (Patient not taking: Reported on 06/10/2020), Disp: 40 tablet, Rfl: 0   tiZANidine (ZANAFLEX) 2 MG tablet, Take 1 tablet (2 mg total) by mouth every 8 (eight) hours as needed for muscle spasms. (Patient  not taking: Reported on 06/10/2020), Disp: 40 tablet, Rfl: 0   triamcinolone cream (KENALOG) 0.1 %, Apply 1 application topically 2 (two) times daily. (Patient not taking: Reported on 06/10/2020), Disp: 80 g, Rfl: 1 No Known Allergies    Social History   Socioeconomic History   Marital status: Divorced    Spouse name: Not on file   Number of children: 2   Years of education: Not on file   Highest education level: Not on file  Occupational History   Occupation: scrap metal   Occupation: disabled, odd jobs  Tobacco Use   Smoking status: Former Smoker    Packs/day: 0.50    Years: 4.00    Pack years: 2.00    Types: Cigarettes    Quit date: 10/29/2005    Years since quitting: 14.6   Smokeless tobacco: Never Used   Tobacco comment: about a pack a week. social  Vaping Use   Vaping Use: Never used  Substance and Sexual Activity   Alcohol use: Yes    Alcohol/week: 42.0 standard drinks    Types: 42 Cans of beer per week    Comment: 24 a week   Drug use: Yes    Frequency: 7.0 times per week    Types: Marijuana    Comment: daily   Sexual activity: Yes    Partners: Female  Other Topics Concern   Not on file  Social History Narrative   ** Merged History Encounter **       Social Determinants of Health   Financial Resource Strain:    Difficulty of Paying Living Expenses: Not on file  Food Insecurity:    Worried About Programme researcher, broadcasting/film/video in the Last Year: Not on file   [employer] of Food in the Last Year: Not on file  Transportation Needs: No Transportation Needs   Lack of Transportation (Medical): No   Lack of Transportation (Non-Medical): No  Physical Activity: Sufficiently Active   Days of Exercise per Week: 7 days   Minutes of Exercise per Session: 30 min  Stress:    Feeling of Stress : Not on file  Social Connections:    Frequency of Communication with Friends and Family: Not on file   Frequency of Social Gatherings with Friends and Family: Not  on file   Attends Religious Services: Not on file   Active Member of Clubs or Organizations: Not on file   Attends Banker Meetings: Not on file   Marital Status: Not on file  Intimate Partner Violence:    Fear of Current or Ex-Partner: Not on file   Emotionally Abused: Not on file   Physically Abused: Not on file   Sexually Abused: Not on file    Physical Exam Cardiovascular:     Rate and Rhythm: Normal rate and regular rhythm.     Pulses: Normal pulses.  Pulmonary:     Effort: Pulmonary effort is normal.     Breath sounds: Normal breath sounds.  Musculoskeletal:  General: Normal range of motion.     Right lower leg: No edema.     Left lower leg: No edema.  Skin:    General: Skin is warm and dry.     Capillary Refill: Capillary refill takes less than 2 seconds.  Neurological:     Mental Status: He is alert and oriented to person, place, and time.  Psychiatric:        Mood and Affect: Mood normal.         Future Appointments  Date Time Provider Department Center  07/08/2020  3:50 PM Arvilla Market, DO [facility] None  07/26/2020 12:15 PM [facility] DEVICE REMOTES [facility]  09/02/2020 11:00 AM [facility] ECHO OP 1 [facility]  09/02/2020 12:00 PM [name], MD [facility] None    BP (!) 87/54 (BP Location: Right Arm, Patient Position: Sitting, Cuff Size: Large)    Pulse 68    Resp 16    Wt (!) 329 lb (149.2 kg)    SpO2 96%    BMI 40.05 kg/m   Weight yesterday- 330 lb Last visit weight- 329 lb  Mr Bady was seen at home today and rpeorted feeling well. He denied chest pain, SOB, headache, dizziness, orthopnea, fever or cough over the past few weeks. He stated he has been compliant with his medications and his weight has been stable. His medications were verified and he continues to manage his own pillbox. He is nearly out of sotalol which we ordered today. If it does not arrive by Friday I advised him to let me know so we  could get him a partial fill to span the gap. He was understanding and agreeable. I will follow up in two weeks unless it becomes necessary sooner.   Jacqualine Code, EMT 07/07/20  ACTION: Home visit completed Next visit planned for 2 weeks

## 2020-07-08 ENCOUNTER — Ambulatory Visit: Payer: Medicare Other | Admitting: Internal Medicine

## 2020-07-08 DIAGNOSIS — Z130 Encounter for screening for diseases of the blood and blood-forming organs and certain disorders involving the immune mechanism: Secondary | ICD-10-CM

## 2020-07-08 DIAGNOSIS — Z1159 Encounter for screening for other viral diseases: Secondary | ICD-10-CM

## 2020-07-08 DIAGNOSIS — R21 Rash and other nonspecific skin eruption: Secondary | ICD-10-CM

## 2020-07-08 DIAGNOSIS — Z1211 Encounter for screening for malignant neoplasm of colon: Secondary | ICD-10-CM

## 2020-07-09 ENCOUNTER — Telehealth: Payer: Self-pay | Admitting: Internal Medicine

## 2020-07-09 NOTE — Telephone Encounter (Signed)
Yes there is a significant drug interaction between fluconazole and amiodarone.    Concurrent use of AMIODARONE and FLUCONAZOLE may result in increased amiodarone exposure and an increased risk of cardiotoxicity (QT prolongation, torsades de pointes, cardiac arrest).

## 2020-07-09 NOTE — Telephone Encounter (Signed)
Spoke with pt who states he is to take 2 Fluconazole tablets and repeat in 1 week for a rash on his legs that he saw Urgent Care for 07/08/2020.  Pt advised there can be an interaction between his Amiodarone and Fluconazole.   RN spoke with Dr Ladona Ridgel who states the 2 medications together can cause his heart to race.  Pt does have an ICD in place so he can take the medication as prescribed if he desires or may contact Urgent Care and request a different medication if he is uncomfortable taking the 2 medications together.  Pt verbalizes understanding and agrees with current plan.  Pt is undecided at this point as to if he weill take medication or contact Urgent Care.  Pt thanked Charity fundraiser for the call.

## 2020-07-09 NOTE — Telephone Encounter (Signed)
   Pt c/o medication issue:  1. Name of Medication: fluconazole  2. How are you currently taking this medication (dosage and times per day)?   3. Are you having a reaction (difficulty breathing--STAT)?   4. What is your medication issue? Pt said he went to urgent care yesterday and was prescribed fluconazole. He wanted to know if this is going to affect with his other heart meds.

## 2020-07-12 ENCOUNTER — Other Ambulatory Visit: Payer: Self-pay | Admitting: Orthopedic Surgery

## 2020-07-14 NOTE — Progress Notes (Signed)
DUE TO COVID-19 ONLY ONE VISITOR IS ALLOWED TO COME WITH YOU AND STAY IN THE WAITING ROOM ONLY DURING PRE OP AND PROCEDURE DAY OF SURGERY. THE 1 VISITOR  MAY VISIT WITH YOU AFTER SURGERY IN YOUR PRIVATE ROOM DURING VISITING HOURS ONLY!  YOU NEED TO HAVE A COVID 19 TEST ON___11/05/2020 ____ @_______ , THIS TEST MUST BE DONE BEFORE SURGERY,  COVID TESTING SITE [address] [place] , IT IS ON THE RIGHT GOING OUT [address] APPROXIMATELY  2 MINUTES PAST [employer] ON THE RIGHT. ONCE YOUR COVID TEST IS COMPLETED,  PLEASE BEGIN THE QUARANTINE INSTRUCTIONS AS OUTLINED IN YOUR HANDOUT.                [name] [name]  07/14/2020   Your procedure is scheduled on: 07/30/2020    Report to [facility] Main  Entrance   Report to admitting at    0515 AM     Call this number if you have problems the morning of surgery [phone]    REMEMBER: NO  SOLID FOOD CANDY OR GUM AFTER MIDNIGHT. CLEAR LIQUIDS UNTIL 0415am        . NOTHING BY MOUTH EXCEPT CLEAR LIQUIDS UNTIL    . PLEASE FINISH ENSURE DRINK PER SURGEON ORDER  WHICH NEEDS TO BE COMPLETED AT    0415am  .      CLEAR LIQUID DIET   Foods Allowed                                                                    Coffee and tea, regular and decaf                            Fruit ices (not with fruit pulp)                                      Iced Popsicles                                    Carbonated beverages, regular and diet                                    Cranberry, grape and apple juices Sports drinks like Gatorade Lightly seasoned clear broth or consume(fat free) Sugar, honey syrup ___________________________________________________________________      BRUSH YOUR TEETH MORNING OF SURGERY AND RINSE YOUR MOUTH OUT, NO CHEWING GUM CANDY OR MINTS.     Take these medicines the morning of surgery with A SIP OF WATER:       Inhalers as usual and bring, amiodarone, batapace                                  You may not have any metal on your body including hair pins and              piercings  Do not wear  jewelry, make-up, lotions, powders or perfumes, deodorant             Do not wear nail polish on your fingernails.  Do not shave  48 hours prior to surgery.              Men may shave face and neck.   Do not bring valuables to the hospital. [place].  Contacts, dentures or bridgework may not be worn into surgery.  Leave suitcase in the car. After surgery it may be brought to your room.     Patients discharged the day of surgery will not be allowed to drive home. IF YOU ARE HAVING SURGERY AND GOING HOME THE SAME DAY, YOU MUST HAVE AN ADULT TO DRIVE YOU HOME AND BE WITH YOU FOR 24 HOURS. YOU MAY GO HOME BY TAXI OR UBER OR ORTHERWISE, BUT AN ADULT MUST ACCOMPANY YOU HOME AND STAY WITH YOU FOR 24 HOURS.  Name and phone number of your driver:  Special Instructions: N/A              Please read over the following fact sheets you were given: _____________________________________________________________________  [facility] - Preparing for Surgery Before surgery, you can play an important role.  Because skin is not sterile, your skin needs to be as free of germs as possible.  You can reduce the number of germs on your skin by washing with CHG (chlorahexidine gluconate) soap before surgery.  CHG is an antiseptic cleaner which kills germs and bonds with the skin to continue killing germs even after washing. Please DO NOT use if you have an allergy to CHG or antibacterial soaps.  If your skin becomes reddened/irritated stop using the CHG and inform your nurse when you arrive at Short Stay. Do not shave (including legs and underarms) for at least 48 hours prior to the first CHG shower.  You may shave your face/neck. Please follow these instructions carefully:  1.  Shower with CHG Soap the night before surgery and the  morning of Surgery.  2.  If  you choose to wash your hair, wash your hair first as usual with your  normal  shampoo.  3.  After you shampoo, rinse your hair and body thoroughly to remove the  shampoo.                           4.  Use CHG as you would any other liquid soap.  You can apply chg directly  to the skin and wash                       Gently with a scrungie or clean washcloth.  5.  Apply the CHG Soap to your body ONLY FROM THE NECK DOWN.   Do not use on face/ open                           Wound or open sores. Avoid contact with eyes, ears mouth and genitals (private parts).                       Wash face,  Genitals (private parts) with your normal soap.             6.  Wash thoroughly, paying special  attention to the area where your surgery  will be performed.  7.  Thoroughly rinse your body with warm water from the neck down.  8.  DO NOT shower/wash with your normal soap after using and rinsing off  the CHG Soap.                9.  Pat yourself dry with a clean towel.            10.  Wear clean pajamas.            11.  Place clean sheets on your bed the night of your first shower and do not  sleep with pets. Day of Surgery : Do not apply any lotions/deodorants the morning of surgery.  Please wear clean clothes to the hospital/surgery center.  FAILURE TO FOLLOW THESE INSTRUCTIONS MAY RESULT IN THE CANCELLATION OF YOUR SURGERY PATIENT SIGNATURE_________________________________  NURSE SIGNATURE__________________________________  ________________________________________________________________________

## 2020-07-19 ENCOUNTER — Encounter (HOSPITAL_COMMUNITY): Payer: Self-pay

## 2020-07-19 ENCOUNTER — Other Ambulatory Visit: Payer: Self-pay

## 2020-07-19 ENCOUNTER — Encounter (HOSPITAL_COMMUNITY)
Admission: RE | Admit: 2020-07-19 | Discharge: 2020-07-19 | Disposition: A | Discharge status: Home / Self Care | Payer: Medicare Other | Source: Ambulatory Visit | Attending: Orthopedic Surgery | Admitting: Orthopedic Surgery

## 2020-07-19 ENCOUNTER — Encounter: Payer: Self-pay | Admitting: Internal Medicine

## 2020-07-19 DIAGNOSIS — J449 Chronic obstructive pulmonary disease, unspecified: Secondary | ICD-10-CM | POA: Insufficient documentation

## 2020-07-19 DIAGNOSIS — I4891 Unspecified atrial fibrillation: Secondary | ICD-10-CM | POA: Diagnosis not present

## 2020-07-19 DIAGNOSIS — I10 Essential (primary) hypertension: Secondary | ICD-10-CM | POA: Insufficient documentation

## 2020-07-19 DIAGNOSIS — Z7901 Long term (current) use of anticoagulants: Secondary | ICD-10-CM | POA: Insufficient documentation

## 2020-07-19 DIAGNOSIS — I428 Other cardiomyopathies: Secondary | ICD-10-CM | POA: Insufficient documentation

## 2020-07-19 DIAGNOSIS — Z95810 Presence of automatic (implantable) cardiac defibrillator: Secondary | ICD-10-CM | POA: Insufficient documentation

## 2020-07-19 DIAGNOSIS — Z01812 Encounter for preprocedural laboratory examination: Secondary | ICD-10-CM | POA: Diagnosis present

## 2020-07-19 DIAGNOSIS — G4733 Obstructive sleep apnea (adult) (pediatric): Secondary | ICD-10-CM | POA: Diagnosis not present

## 2020-07-19 DIAGNOSIS — I5022 Chronic systolic (congestive) heart failure: Secondary | ICD-10-CM | POA: Diagnosis not present

## 2020-07-19 DIAGNOSIS — Z87891 Personal history of nicotine dependence: Secondary | ICD-10-CM | POA: Diagnosis not present

## 2020-07-19 DIAGNOSIS — Z79899 Other long term (current) drug therapy: Secondary | ICD-10-CM | POA: Insufficient documentation

## 2020-07-19 DIAGNOSIS — Z9989 Dependence on other enabling machines and devices: Secondary | ICD-10-CM | POA: Diagnosis not present

## 2020-07-19 DIAGNOSIS — M1712 Unilateral primary osteoarthritis, left knee: Secondary | ICD-10-CM | POA: Diagnosis not present

## 2020-07-19 LAB — COMPREHENSIVE METABOLIC PANEL
ALT: 20 U/L (ref 0–44)
AST: 16 U/L (ref 15–41)
Albumin: 4.4 g/dL (ref 3.5–5.0)
Alkaline Phosphatase: 87 U/L (ref 38–126)
Anion gap: 11 (ref 5–15)
BUN: 15 mg/dL (ref 6–20)
CO2: 31 mmol/L (ref 22–32)
Calcium: 9.6 mg/dL (ref 8.9–10.3)
Chloride: 100 mmol/L (ref 98–111)
Creatinine, Ser: 1.24 mg/dL (ref 0.61–1.24)
GFR, Estimated: >60 mL/min (ref >60)
Glucose, Bld: 137 mg/dL — ABNORMAL HIGH (ref 70–99)
Potassium: 4.7 mmol/L (ref 3.5–5.1)
Sodium: 142 mmol/L (ref 135–145)
Total Bilirubin: 1 mg/dL (ref 0.3–1.2)
Total Protein: 7.5 g/dL (ref 6.5–8.1)

## 2020-07-19 LAB — URINALYSIS, ROUTINE W REFLEX MICROSCOPIC
Bilirubin Urine: NEGATIVE
Glucose, UA: NEGATIVE mg/dL
Hgb urine dipstick: NEGATIVE
Ketones, ur: NEGATIVE mg/dL
Nitrite: NEGATIVE
Protein, ur: NEGATIVE mg/dL
Specific Gravity, Urine: 1.005 (ref 1.005–1.030)
pH: 6 (ref 5.0–8.0)

## 2020-07-19 LAB — PROTIME-INR
INR: 1.1 (ref 0.8–1.2)
Prothrombin Time: 14.2 seconds (ref 11.4–15.2)

## 2020-07-19 LAB — CBC WITH DIFFERENTIAL/PLATELET
Abs Immature Granulocytes: 0.02 10*3/uL (ref 0.00–0.07)
Basophils Absolute: 0 10*3/uL (ref 0.0–0.1)
Basophils Relative: 1 %
Eosinophils Absolute: 0.3 10*3/uL (ref 0.0–0.5)
Eosinophils Relative: 6 %
HCT: 35.7 % — ABNORMAL LOW (ref 39.0–52.0)
Hemoglobin: 11.9 g/dL — ABNORMAL LOW (ref 13.0–17.0)
Immature Granulocytes: 1 %
Lymphocytes Relative: 26 %
Lymphs Abs: 1.1 10*3/uL (ref 0.7–4.0)
MCH: 33.5 pg (ref 26.0–34.0)
MCHC: 33.3 g/dL (ref 30.0–36.0)
MCV: 100.6 fL — ABNORMAL HIGH (ref 80.0–100.0)
Monocytes Absolute: 0.3 10*3/uL (ref 0.1–1.0)
Monocytes Relative: 7 %
Neutro Abs: 2.7 10*3/uL (ref 1.7–7.7)
Neutrophils Relative %: 59 %
Platelets: 168 10*3/uL (ref 150–400)
RBC: 3.55 MIL/uL — ABNORMAL LOW (ref 4.22–5.81)
RDW: 13.7 % (ref 11.5–15.5)
WBC: 4.4 10*3/uL (ref 4.0–10.5)
nRBC: 0 % (ref 0.0–0.2)

## 2020-07-19 LAB — TYPE AND SCREEN
ABO/RH(D): O POS
Antibody Screen: NEGATIVE

## 2020-07-19 LAB — SURGICAL PCR SCREEN
MRSA, PCR: NEGATIVE
Staphylococcus aureus: NEGATIVE

## 2020-07-19 LAB — APTT: aPTT: 31 seconds (ref 24–36)

## 2020-07-19 NOTE — Progress Notes (Addendum)
        Anesthesia Review:  PCP:  Cardiologist :05/28/2020 - LOV- Zigmund Daniel- card- epic DR Shirlee Latch LOV- 05/10/20 LOV- epic 03/30/20- last dev check- epic  Device orders have been requested on 07/19/2020.  Chest x-ray : EKG :05/05/20 CXR- 04/17/20  Echo : Stress test: Cardiac Cath :  Activity level:  Sleep Study/ CPAP :yes- pt to gring mask and tubing  Fasting Blood Sugar :      / Checks Blood Sugar -- times a day:   Blood Thinner/ Instructions /Last Dose: ASA / Instructions/ Last Dose :  Xarelto - PT HAS NOT YET RECEIVED PROEP INSTRUCTIONS AT TIME OF PREOP APPT,  PT INSTRUCTED TO CALL CARDIOLOGY AND SURGEON OFFICE FOR PREOP INSTRUCTIONS REGARDING XARELTO.  U/A routed via epic to DR Graves.

## 2020-07-19 NOTE — Progress Notes (Unsigned)
PERIOPERATIVE PRESCRIPTION FOR IMPLANTED CARDIAC DEVICE PROGRAMMING  Patient Information: Name:  [name] [name]  DOB:  [birth year]  MRN:  [id number]  {TIP - You do not have to delete this tip  -  Copy the info from the staff message sent by the PAT staff  then press F2 here and paste the information using CTL - V on the next line :[id number]}  Planned Procedure: left total knee arthroplasty  Surgeon: Dr [name]  Date of Procedure: 07/30/2020  Cautery will be used.  Position during surgery:   Please send documentation back to:  [facility] (Fax # [phone])   Device Information:  Clinic EP Physician:  Sherryl Manges, MD   Device Type:  Defibrillator Manufacturer and Phone #:  Medtronic: [id number] Pacemaker Dependent?:  No. Date of Last Device Check:  06/10/2020 Normal Device Function?:  Yes.    Electrophysiologist's Recommendations:   Have magnet available.  Provide continuous ECG monitoring when magnet is used or reprogramming is to be performed.   Procedure should not interfere with device function.  No device programming or magnet placement needed.  Per Device Clinic Standing Orders, [name], RN  3:14 PM 07/19/2020

## 2020-07-21 ENCOUNTER — Other Ambulatory Visit (HOSPITAL_COMMUNITY): Payer: Self-pay

## 2020-07-21 NOTE — Progress Notes (Signed)
Paramedicine Encounter    Patient ID: [name] [name], male    DOB: Jul 05, 1961, 59 y.o.   MRN: [id number]   Patient Care Team: Arvilla Market, DO as PCP - General (Family Medicine) [name], [name], MD as PCP - Advanced Heart Failure (Cardiology) [name], MD as PCP - Electrophysiology (Cardiology) [name], MD as PCP - Cardiology (Cardiology) [name], MD (Family Medicine) [name], LCSW as Social Worker (Licensed Clinical Social Worker)  Patient Active Problem List   Diagnosis Date Noted  . Ventricular tachycardia (HCC) 04/17/2020  . Primary osteoarthritis of right knee 01/30/2020  . Hypokalemia 11/23/2019  . Primary osteoarthritis of right hip 11/03/2018  . VT (ventricular tachycardia) (HCC) 05/30/2017  . AKI (acute kidney injury) (HCC)   . Obesity 12/25/2016  . OSA (obstructive sleep apnea) 12/25/2016  . Hyperglycemia 12/25/2016  . Acute respiratory failure with hypoxia (HCC) 12/25/2016  . Chronic anticoagulation 06/27/2016  . Encounter for therapeutic drug monitoring 04/19/2015  . S/P ICD (internal cardiac defibrillator) procedure Medtronic CRT ICD 04/15/2015  . Atrial fibrillation with RVR - Paroxysmal 04/08/2015  . NICM 04/06/2015  . Troponin level elevated 04/06/2015  . Rectal bleeding 10/26/2011  . Hydrocele, left 05/24/2011  . Tobacco use disorder- 1/2 pk a day   . Alcohol abuse   . Obesity 09/27/2008  . CAD- minor CAD 2009 and 2016 09/27/2008  . VENTRICULAR TACHYCARDIA 09/27/2008  . Essential hypertension 06/19/2008  . Chronic systolic heart failure (HCC) 06/19/2008    Current Outpatient Medications:  .  albuterol (VENTOLIN HFA) 108 (90 Base) MCG/ACT inhaler, Inhale 1 puff into the lungs every 6 (six) hours as needed for wheezing or shortness of breath., Disp: 18 g, Rfl: 3 .  amiodarone (PACERONE) 200 MG tablet, Take 1 tablet (200 mg total) by mouth daily. Starting 05-19-2020 (Patient taking differently: Take 200 mg by  mouth daily. ), Disp: 60 tablet, Rfl: 3 .  Ascorbic Acid (VITAMIN C) 100 MG tablet, Take 100 mg by mouth daily., Disp: , Rfl:  .  atorvastatin (LIPITOR) 80 MG tablet, Take 1 tablet (80 mg total) by mouth every evening., Disp: 180 tablet, Rfl: 3 .  potassium chloride SA (KLOR-CON) 20 MEQ tablet, Take 2 tablets (40 mEq total) by mouth 2 (two) times daily., Disp: 240 tablet, Rfl: 3 .  rivaroxaban (XARELTO) 20 MG TABS tablet, Take 1 tablet (20 mg total) by mouth daily with supper., Disp: 30 tablet, Rfl: 6 .  sacubitril-valsartan (ENTRESTO) 24-26 MG, Take 1 tablet by mouth 2 (two) times daily. (Patient taking differently: Take 0.5 tablets by mouth 2 (two) times daily. ), Disp: 60 tablet, Rfl: 3 .  sotalol (BETAPACE) 80 MG tablet, Take 1 tablet (80 mg total) by mouth every 12 (twelve) hours., Disp: 60 tablet, Rfl: 6 .  spironolactone (ALDACTONE) 25 MG tablet, Take 1 tablet (25 mg total) by mouth daily., Disp: 90 tablet, Rfl: 3 .  torsemide (DEMADEX) 20 MG tablet, Take 3 tablets (60 mg total) by mouth every morning AND 2 tablets (40 mg total) every evening. (Patient taking differently: Take 60 mg by mouth every morning and 40 mg every evening.), Disp: 300 tablet, Rfl: 3 No Known Allergies    Social History   Socioeconomic History  . Marital status: Single    Spouse name: Not on file  . Number of children: 2  . Years of education: Not on file  . Highest education level: Not on file  Occupational History  . Occupation: Copy  .  Occupation: disabled, odd jobs  Tobacco Use  . Smoking status: Former Smoker    Packs/day: 0.50    Years: 4.00    Pack years: 2.00    Types: Cigarettes    Quit date: 10/29/2005    Years since quitting: 14.7  . Smokeless tobacco: Never Used  . Tobacco comment: about a pack a week. social  Vaping Use  . Vaping Use: Never used  Substance and Sexual Activity  . Alcohol use: Yes    Comment: 12 beers per week   . Drug use: Not Currently    Types: Marijuana  .  Sexual activity: Yes    Partners: Female  Other Topics Concern  . Not on file  Social History Narrative   ** Merged History Encounter **       Social Determinants of Health   Financial Resource Strain:   . Difficulty of Paying Living Expenses: Not on file  Food Insecurity:   . Worried About Programme researcher, broadcasting/film/video in the Last Year: Not on file  . Ran Out of Food in the Last Year: Not on file  Transportation Needs: No Transportation Needs  . Lack of Transportation (Medical): No  . Lack of Transportation (Non-Medical): No  Physical Activity: Sufficiently Active  . Days of Exercise per Week: 7 days  . Minutes of Exercise per Session: 30 min  Stress:   . Feeling of Stress : Not on file  Social Connections:   . Frequency of Communication with Friends and Family: Not on file  . Frequency of Social Gatherings with Friends and Family: Not on file  . Attends Religious Services: Not on file  . Active Member of Clubs or Organizations: Not on file  . Attends Banker Meetings: Not on file  . Marital Status: Not on file  Intimate Partner Violence:   . Fear of Current or Ex-Partner: Not on file  . Emotionally Abused: Not on file  . Physically Abused: Not on file  . Sexually Abused: Not on file    Physical Exam Cardiovascular:     Rate and Rhythm: Normal rate and regular rhythm.     Pulses: Normal pulses.  Pulmonary:     Effort: Pulmonary effort is normal.     Breath sounds: Normal breath sounds.  Musculoskeletal:        General: Normal range of motion.     Right lower leg: No edema.     Left lower leg: No edema.  Skin:    General: Skin is warm and dry.     Capillary Refill: Capillary refill takes less than 2 seconds.  Neurological:     Mental Status: He is alert and oriented to person, place, and time.  Psychiatric:        Mood and Affect: Mood normal.         Future Appointments  Date Time Provider Department Center  07/26/2020 12:15 PM [facility] DEVICE  REMOTES [facility]  07/27/2020 11:05 AM MC-SCREENING [facility] None  09/02/2020 11:00 AM MC ECHO OP 1 [facility]  09/02/2020 12:00 PM [name], MD [facility] None  09/29/2020  8:25 AM [facility] DEVICE REMOTES [facility]  12/29/2020  8:25 AM [facility] DEVICE REMOTES [facility]    BP 102/60 (BP Location: Left Arm, Patient Position: Sitting, Cuff Size: Large)   Pulse 62   Resp 16   Wt (!) 329 lb (149.2 kg)   SpO2 97%   BMI 40.05 kg/m   Weight yesterday- 329 lb Last  visit weight- 329.5 lb  [name] [name] was seen at home today and reported feeling well. He denied chest pain, SOB, headache, orthopnea, fever or cough. He complained of intermittent dizziness when going from seated to standing but said it resolves quickly. He stated he has been compliant with his medications and his weight has been stable. His medications were verified and his pillbox was refilled. He has a total knee replacement next week on Friday so I contacted the HF clinic pharmacist, Karle Plumber, who advised he should hold Xarelto for 3 days prior to surgery. [name] [name] was made aware of this and was understanding. He made the adjustment to his pillbox but I will follow up next week to ensure he has stopped taking it before surgery.   Jacqualine Code, EMT 07/21/20  ACTION: Home visit completed Next visit planned for 2 weeks Next call planned for 1 week

## 2020-07-22 NOTE — Progress Notes (Signed)
Anesthesia Chart Review   Case: [id number] Date/Time: 07/30/20 0700   Procedure: TOTAL KNEE ARTHROPLASTY (Left Knee)   Anesthesia type: Spinal   Pre-op diagnosis: LEFT KNEE OSTEOARTHRITIS   Location: [facility] ROOM 08 / [facility]   Surgeons: [name], MD      DISCUSSION:59 y.o. former smoker (2 pack years, quit 10/29/05) with h/o HTN, COPD, a-fib (on xarelto), OSA on CPAP, CHF, dilated NICM s/p bi V ICD (last device check 03/30/2020, device orders on chart), left knee OA scheduled for above procedure 07/30/20 with Dr. [name].   Per cardiology preoperative evaluation 07/26/2020, "Chart reviewed as part of pre-operative protocol coverage. Patient was contacted 07/26/2020 in reference to pre-operative risk assessment for pending surgery as outlined below.  Jshawn Molina was last seen on 06/07/20 by Francis Dowse, PA-C.  Since that day, Zubin Vanroekel has done well from a cardiac standpoint. He can complete 4 METs without anginal complaints Therefore, based on ACC/AHA guidelines, the patient would be at acceptable risk for the planned procedure without further cardiovascular testing. Per pharmacy recommendations, patient can hold xarelto 3 days prior to his upcoming knee replacement with plans to restart when cleared to do so by his surgeon. Patient instructed to hold xarelto after today's dose in preparation for surgery."  Anticipate pt can proceed with planned procedure barring acute status change.   VS: BP 122/68   Pulse 64   Temp 36.8 C (Oral)   Resp 16   Ht 6\' 4"  (1.93 m)   Wt (!) 149.5 kg   SpO2 97%   BMI 40.11 kg/m   PROVIDERS: , DO is PCP   Arvilla Market, MD is Cardiologist   Marca Ancona, MD is EP LABS: Labs reviewed: Acceptable for surgery. (all labs ordered are listed, but only abnormal results are displayed)  Labs Reviewed  CBC WITH DIFFERENTIAL/PLATELET - Abnormal; Notable for the following components:      Result Value   RBC 3.55 (*)    Hemoglobin 11.9  (*)    HCT 35.7 (*)    MCV 100.6 (*)    All other components within normal limits  COMPREHENSIVE METABOLIC PANEL - Abnormal; Notable for the following components:   Glucose, Bld 137 (*)    All other components within normal limits  URINALYSIS, ROUTINE W REFLEX MICROSCOPIC - Abnormal; Notable for the following components:   Color, Urine STRAW (*)    Leukocytes,Ua LARGE (*)    Bacteria, UA RARE (*)    All other components within normal limits  SURGICAL PCR SCREEN  APTT  PROTIME-INR  TYPE AND SCREEN     IMAGES:   EKG: 05/05/20 Rate 66 bpm  SR  CV: Echo 09/16/2019 IMPRESSIONS    1. Left ventricular ejection fraction, by visual estimation, is 20 to  25%. The left ventricle has severely decreased function. There is  moderately increased left ventricular hypertrophy.  2. Left ventricular diastolic parameters are consistent with Grade II  diastolic dysfunction (pseudonormalization).  3. Severely dilated left ventricular internal cavity size.  4. The left ventricle demonstrates global hypokinesis.  5. Global right ventricle has normal systolic function.The right  ventricular size is normal. No increase in right ventricular wall  thickness.  6. A pacer wire is visualized in the RV.  7. Left atrial size was moderately dilated.  8. Right atrial size was mildly dilated.  9. The mitral valve is normal in structure. No evidence of mitral valve  regurgitation. No evidence of mitral stenosis.  10. The  tricuspid valve is normal in structure.  11. The aortic valve is tricuspid. Aortic valve regurgitation is not  visualized. No evidence of aortic valve sclerosis or stenosis.  12. TR signal is inadequate for assessing pulmonary artery systolic  pressure.  13. The inferior vena cava is normal in size with greater than 50%  respiratory variability, suggesting right atrial pressure of 3 mmHg.   Cardiac Cath 04/08/2015  Nonobstructive coronary artery disease.  Elevated  LVEDP  Moderate pulmonary artery hypertension.  Cardiac output 5.5 L/min; CI 2.1.   Await echo results for LV function.  Continue medical therapy.  Past Medical History:  Diagnosis Date  . AICD (automatic cardioverter/defibrillator) present   . Arthritis   . Atrial fibrillation (HCC)   . CAD (coronary artery disease)    Mild nonobstructive (Cath 09)  . Chronic systolic heart failure (HCC)    Nonischemic CM: echo 4/12 with EF 25-30%, grade 2 diast dysfxn, mild dilated aortic root 43 mm, trivial MR, mod LAE  . COPD (chronic obstructive pulmonary disease) (HCC)   . Dyspnea    with exertion   . Ejection fraction < 50% 08/2019   20-25% noted on ECHO  . H/O ETOH abuse   . HTN (hypertension)   . Hypersomnia   . Nonischemic cardiomyopathy (HCC)   . Obesity, morbid (HCC)   . OSA on CPAP   . Systolic and diastolic CHF, acute on chronic (HCC) 11/2016  . Tobacco use disorder   . Ventricular tachycardia [facility])     Past Surgical History:  Procedure Laterality Date  . CARDIAC CATHETERIZATION N/A 04/08/2015   Procedure: Right/Left Heart Cath and Coronary Angiography;  Surgeon: Corky Crafts, MD;  Location: [facility] INVASIVE CV LAB;  Service: Cardiovascular;  Laterality: N/A;  . EP IMPLANTABLE DEVICE N/A 04/14/2015   Procedure: BiV ICD Insertion CRT-D;  Surgeon: [name], MD;  Location: [facility] INVASIVE CV LAB;  Service: Cardiovascular;  Laterality: N/A;  . HYDROCELE EXCISION / REPAIR    . ICD IMPLANT    . TOTAL HIP ARTHROPLASTY Right 11/04/2018   Procedure: TOTAL HIP ARTHROPLASTY ANTERIOR APPROACH;  Surgeon: [name], MD;  Location: [facility];  Service: Orthopedics;  Laterality: Right;  . TOTAL KNEE ARTHROPLASTY Right 01/30/2020   Procedure: TOTAL KNEE ARTHROPLASTY;  Surgeon: [name], MD;  Location: [facility];  Service: Orthopedics;  Laterality: Right;  . TUMOR EXCISION Left   . 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 VASECTOMY      MEDICATIONS: . albuterol  (VENTOLIN HFA) 108 (90 Base) MCG/ACT inhaler  . amiodarone (PACERONE) 200 MG tablet  . Ascorbic Acid (VITAMIN C) 100 MG tablet  . atorvastatin (LIPITOR) 80 MG tablet  . potassium chloride SA (KLOR-CON) 20 MEQ tablet  . rivaroxaban (XARELTO) 20 MG TABS tablet  . sacubitril-valsartan (ENTRESTO) 24-26 MG  . sotalol (BETAPACE) 80 MG tablet  . spironolactone (ALDACTONE) 25 MG tablet  . torsemide (DEMADEX) 20 MG tablet   No current facility-administered medications for this encounter.    Jodell Cipro, PA-C [facility] [phone]

## 2020-07-26 ENCOUNTER — Telehealth: Payer: Self-pay | Admitting: Internal Medicine

## 2020-07-26 ENCOUNTER — Ambulatory Visit (INDEPENDENT_AMBULATORY_CARE_PROVIDER_SITE_OTHER): Payer: Medicare Other

## 2020-07-26 DIAGNOSIS — Z95810 Presence of automatic (implantable) cardiac defibrillator: Secondary | ICD-10-CM

## 2020-07-26 DIAGNOSIS — I5022 Chronic systolic (congestive) heart failure: Secondary | ICD-10-CM

## 2020-07-26 NOTE — Telephone Encounter (Signed)
° °  [facility] Pre-operative Risk Assessment    HEARTCARE STAFF: - Please ensure there is not already an duplicate clearance open for this procedure. - Under Visit Info/Reason for Call, type in Other and utilize the format Clearance MM/DD/YY or Clearance TBD. Do not use dashes or single digits. - If request is for dental extraction, please clarify the # of teeth to be extracted.  Request for surgical clearance:  1. What type of surgery is being performed?  LEFT TOTAL KNEE ARTHROPLASTY    2. When is this surgery scheduled?  07/30/2020   3. What type of clearance is required (medical clearance vs. Pharmacy clearance to hold med vs. Both)?  BOTH  4. Are there any medications that need to be held prior to surgery and how long? [place]   5. Practice name and name of physician performing surgery?  [facility] / DR. GRAVES   6. What is the office phone number?  [phone]   7.   What is the office fax number?  [phone]  8.   Anesthesia type (None, local, MAC, general) ?     Jeanann Lewandowsky 07/26/2020, 11:55 AM  _________________________________________________________________   (provider comments below)

## 2020-07-26 NOTE — Telephone Encounter (Signed)
   Primary Cardiologist: Marca Ancona, MD  Chart reviewed as part of pre-operative protocol coverage. Patient was contacted 07/26/2020 in reference to pre-operative risk assessment for pending surgery as outlined below.  [name] [name] was last seen on 06/07/20 by Francis Dowse, PA-C.  Since that day, [name] [name] has done well from a cardiac standpoint. He can complete 4 METs without anginal complaints  Therefore, based on ACC/AHA guidelines, the patient would be at acceptable risk for the planned procedure without further cardiovascular testing.  Per pharmacy recommendations, patient can hold xarelto 3 days prior to his upcoming knee replacement with plans to restart when cleared to do so by his surgeon. Patient instructed to hold xarelto after today's dose in preparation for surgery.   The patient was advised that if he develops new symptoms prior to surgery to contact our office to arrange for a follow-up visit, and he verbalized understanding.  I will route this recommendation to the requesting party via Epic fax function and remove from pre-op pool. Please call with questions.  Beatriz Stallion, PA-C 07/26/2020, 1:53 PM

## 2020-07-26 NOTE — Telephone Encounter (Signed)
Patient with diagnosis of afib on Xarelto for anticoagulation.    Procedure: LEFT TOTAL KNEE ARTHROPLASTY   Date of procedure: 07/30/20    CHA2DS2-VASc Score = 3  This indicates a 3.2% annual risk of stroke. The patient's score is based upon: CHF History: 1 HTN History: 1 Diabetes History: 0 Stroke History: 0 Vascular Disease History: 1 Age Score: 0 Gender Score: 0     CrCl 103 ml/min  Per office protocol, patient can hold Xarelto for 3 days prior to procedure.

## 2020-07-26 NOTE — Telephone Encounter (Signed)
New Message:    She said pt showed up this morning for an appointment for clearance. He was told he did not need an appointment for clearance. If he does not need clearance, she will need a letter stating that he cleared for surgery. His surgery is scheduled 07-30-20.

## 2020-07-26 NOTE — Telephone Encounter (Signed)
   Primary Cardiologist: Marca Ancona, MD  Chart reviewed as part of pre-operative protocol coverage. Does not appear there is a formal preop request for this patient. Appears patient has an upcoming knee replacement scheduled for 07/30/20. He is on xarelto for history of paroxysmal atrial fibrillation. Will route to pharmacy for recommendations for holding xarelto prior to surgery.   Callback: - Please clarify what is needed from Dr. Luiz Blare office and generate a formal request.   Beatriz Stallion, PA-C 07/26/2020, 11:38 AM

## 2020-07-27 ENCOUNTER — Other Ambulatory Visit (HOSPITAL_COMMUNITY)
Admission: RE | Admit: 2020-07-27 | Discharge: 2020-07-27 | Disposition: A | Discharge status: Home / Self Care | Payer: Medicare Other | Source: Ambulatory Visit | Attending: Orthopedic Surgery | Admitting: Orthopedic Surgery

## 2020-07-27 DIAGNOSIS — Z01818 Encounter for other preprocedural examination: Secondary | ICD-10-CM | POA: Diagnosis present

## 2020-07-27 DIAGNOSIS — Z20822 Contact with and (suspected) exposure to covid-19: Secondary | ICD-10-CM | POA: Insufficient documentation

## 2020-07-27 LAB — SARS CORONAVIRUS 2 (TAT 6-24 HRS): SARS Coronavirus 2: NEGATIVE

## 2020-07-27 NOTE — Anesthesia Preprocedure Evaluation (Addendum)
Anesthesia Evaluation  Patient identified by MRN, date of birth, ID band Patient awake    Reviewed: Allergy & Precautions, NPO status , Patient's Chart, lab work & pertinent test results  History of Anesthesia Complications Negative for: history of anesthetic complications  Airway Mallampati: II  TM Distance: >3 FB Neck ROM: Full    Dental  (+) Dental Advisory Given, Teeth Intact   Pulmonary shortness of breath, sleep apnea , COPD, former smoker,    breath sounds clear to auscultation       Cardiovascular hypertension, Pt. on medications + CAD and +CHF  + dysrhythmias Atrial Fibrillation + Cardiac Defibrillator  Rhythm:Irregular  1. Left ventricular ejection fraction, by visual estimation, is 20 to  25%. The left ventricle has severely decreased function. There is  moderately increased left ventricular hypertrophy.  2. Left ventricular diastolic parameters are consistent with Grade II  diastolic dysfunction (pseudonormalization).  3. Severely dilated left ventricular internal cavity size.  4. The left ventricle demonstrates global hypokinesis.  5. Global right ventricle has normal systolic function.The right  ventricular size is normal. No increase in right ventricular wall  thickness.  6. A pacer wire is visualized in the RV.  7. Left atrial size was moderately dilated.  8. Right atrial size was mildly dilated.  9. The mitral valve is normal in structure. No evidence of mitral valve  regurgitation. No evidence of mitral stenosis.  10. The tricuspid valve is normal in structure.  11. The aortic valve is tricuspid. Aortic valve regurgitation is not  visualized. No evidence of aortic valve sclerosis or stenosis.  12. TR signal is inadequate for assessing pulmonary artery systolic  pressure.  13. The inferior vena cava is normal in size with greater than 50%  respiratory variability, suggesting right atrial pressure of  3 mmHg.    Neuro/Psych negative neurological ROS  negative psych ROS   GI/Hepatic negative GI ROS, Neg liver ROS,   Endo/Other  Morbid obesity  Renal/GU Renal diseaseLab Results      Component                Value               Date                      CREATININE               1.24                07/19/2020                Musculoskeletal  (+) Arthritis ,   Abdominal   Peds  Hematology  (+) Blood dyscrasia, anemia , Xarelto last dose 11/8  Lab Results      Component                Value               Date                      WBC                      4.4                 07/19/2020                HGB  11.9 (L)            07/19/2020                HCT                      35.7 (L)            07/19/2020                MCV                      100.6 (H)           07/19/2020                PLT                      168                 07/19/2020              Anesthesia Other Findings   Reproductive/Obstetrics                            Anesthesia Physical Anesthesia Plan  ASA: III  Anesthesia Plan: MAC, Spinal and Regional   Post-op Pain Management:  Regional for Post-op pain   Induction: Intravenous  PONV Risk Score and Plan: 1 and Propofol infusion and Treatment may vary due to age or medical condition  Airway Management Planned: Nasal Cannula  Additional Equipment: None  Intra-op Plan:   Post-operative Plan:   Informed Consent: I have reviewed the patients History and Physical, chart, labs and discussed the procedure including the risks, benefits and alternatives for the proposed anesthesia with the patient or authorized representative who has indicated his/her understanding and acceptance.     Dental advisory given  Plan Discussed with: CRNA and Surgeon  Anesthesia Plan Comments: (See PAT note 07/19/2020, Konrad Felix, PA-C   Magnet for medtronic biv AICD)       Anesthesia Quick Evaluation

## 2020-07-28 NOTE — Progress Notes (Signed)
EPIC Encounter for ICM Monitoring  Patient Name: [name] [name] is a 60 y.o. male Date: 07/28/2020 Primary Care Physican: [name] Market, DO Primary Cardiologist:[name] [name] Electrophysiologist:[name] [name] Bi-V Pacing:91.9% 10/6/2021Weight: 328lbs   Spoke with patient and reports feeling well at this time.  Denies fluid symptoms.   Total Knee surgery scheduled 07/30/2020   OptivolThoracic impedancenormal.  Prescribed:  Torsemide20 mg 3 tablets (60 mg total)every morning and 2 tablets (40 mg total) every evening.  Potassium 2 tablets (40 mEq total) twice a day.  Spironolactone 25 mg take 1 tablet daily.  Labs: 07/19/2020 Creatinine 1.24, BUN 15, Potassium 4.7, Sodium 142 05/10/2020 Creatinine 1.27, BUN 12, Potassium 4.1, Sodium 136, GFR >60  05/05/2020 Creatinine 1.35, BUN 20, Potassium 4.7, Sodium 139, GFR 58-67  04/17/2020 Creatinine 1.41, BUN 15, Potassium 4.0, Sodium 137, GFR 55->60  A complete set of results can be found in Results Review.  Recommendations: No changes and encouraged to call if experiencing any fluid symptoms.  Follow-up plan: ICM clinic phone appointment on 08/04/2020 to recheck fluid levels after knee surgery. 91 day device clinic remote transmission1/08/2021.   EP/Cardiology Office Visits:09/02/2020 withDr [name] [name].   Copy of ICM check sent to Dr.[name].  3 month ICM trend: 07/26/2020    1 Year ICM trend:       [name] Soda, RN 07/28/2020 10:17 AM

## 2020-07-29 MED ORDER — DEXTROSE 5 % IV SOLN
3.0000 g | INTRAVENOUS | Status: AC
  Administered 2020-07-30: 3 g via INTRAVENOUS
  Filled 2020-07-29: qty 3

## 2020-07-29 MED ORDER — BUPIVACAINE LIPOSOME 1.3 % IJ SUSP
20.0000 mL | Freq: Once | INTRAMUSCULAR | Status: DC
  Filled 2020-07-29: qty 20

## 2020-07-30 ENCOUNTER — Other Ambulatory Visit: Payer: Self-pay

## 2020-07-30 ENCOUNTER — Observation Stay (HOSPITAL_COMMUNITY)
Admission: RE | Admit: 2020-07-30 | Discharge: 2020-07-31 | Disposition: A | Discharge status: Home / Self Care | Payer: Medicare Other | Source: Other Acute Inpatient Hospital | Attending: Orthopedic Surgery | Admitting: Orthopedic Surgery

## 2020-07-30 ENCOUNTER — Ambulatory Visit (HOSPITAL_COMMUNITY): Payer: Medicare Other | Admitting: Physician Assistant

## 2020-07-30 ENCOUNTER — Encounter (HOSPITAL_COMMUNITY)
Admission: RE | Disposition: A | Discharge status: Home / Self Care | Payer: Self-pay | Source: Other Acute Inpatient Hospital | Attending: Orthopedic Surgery

## 2020-07-30 ENCOUNTER — Encounter (HOSPITAL_COMMUNITY): Payer: Self-pay | Admitting: Orthopedic Surgery

## 2020-07-30 ENCOUNTER — Ambulatory Visit (HOSPITAL_COMMUNITY): Payer: Medicare Other | Admitting: Certified Registered Nurse Anesthetist

## 2020-07-30 DIAGNOSIS — I2510 Atherosclerotic heart disease of native coronary artery without angina pectoris: Secondary | ICD-10-CM | POA: Diagnosis not present

## 2020-07-30 DIAGNOSIS — Z87891 Personal history of nicotine dependence: Secondary | ICD-10-CM | POA: Insufficient documentation

## 2020-07-30 DIAGNOSIS — M1712 Unilateral primary osteoarthritis, left knee: Principal | ICD-10-CM | POA: Insufficient documentation

## 2020-07-30 DIAGNOSIS — I4891 Unspecified atrial fibrillation: Secondary | ICD-10-CM | POA: Insufficient documentation

## 2020-07-30 DIAGNOSIS — G4733 Obstructive sleep apnea (adult) (pediatric): Secondary | ICD-10-CM | POA: Diagnosis not present

## 2020-07-30 DIAGNOSIS — Z96652 Presence of left artificial knee joint: Secondary | ICD-10-CM

## 2020-07-30 DIAGNOSIS — I10 Essential (primary) hypertension: Secondary | ICD-10-CM | POA: Insufficient documentation

## 2020-07-30 DIAGNOSIS — J449 Chronic obstructive pulmonary disease, unspecified: Secondary | ICD-10-CM | POA: Insufficient documentation

## 2020-07-30 DIAGNOSIS — Z950 Presence of cardiac pacemaker: Secondary | ICD-10-CM | POA: Insufficient documentation

## 2020-07-30 DIAGNOSIS — Z9989 Dependence on other enabling machines and devices: Secondary | ICD-10-CM | POA: Insufficient documentation

## 2020-07-30 DIAGNOSIS — Z01811 Encounter for preprocedural respiratory examination: Secondary | ICD-10-CM

## 2020-07-30 HISTORY — PX: TOTAL KNEE ARTHROPLASTY: SHX125

## 2020-07-30 SURGERY — ARTHROPLASTY, KNEE, TOTAL
Anesthesia: Monitor Anesthesia Care | Site: Knee | Laterality: Left

## 2020-07-30 MED ORDER — POVIDONE-IODINE 10 % EX SWAB
2.0000 "application " | Freq: Once | CUTANEOUS | Status: AC
  Administered 2020-07-30: 2 via TOPICAL

## 2020-07-30 MED ORDER — MIDAZOLAM HCL 2 MG/2ML IJ SOLN
1.0000 mg | INTRAMUSCULAR | Status: DC
  Administered 2020-07-30: 1 mg via INTRAVENOUS
  Filled 2020-07-30: qty 2

## 2020-07-30 MED ORDER — BUPIVACAINE IN DEXTROSE 0.75-8.25 % IT SOLN
INTRATHECAL | Status: DC | PRN
  Administered 2020-07-30: 2 mL via INTRATHECAL

## 2020-07-30 MED ORDER — ASPIRIN EC 325 MG PO TBEC: 325.0000 mg | DELAYED_RELEASE_TABLET | Freq: Two times a day (BID) | ORAL | Status: DC

## 2020-07-30 MED ORDER — BISACODYL 5 MG PO TBEC: 5.0000 mg | DELAYED_RELEASE_TABLET | Freq: Every day | ORAL | Status: DC | PRN

## 2020-07-30 MED ORDER — ROPIVACAINE HCL 7.5 MG/ML IJ SOLN
INTRAMUSCULAR | Status: DC | PRN
  Administered 2020-07-30: 20 mL via PERINEURAL

## 2020-07-30 MED ORDER — SODIUM CHLORIDE 0.9 % IV SOLN: INTRAVENOUS | Status: DC

## 2020-07-30 MED ORDER — FENTANYL CITRATE (PF) 100 MCG/2ML IJ SOLN: 25.0000 ug | INTRAMUSCULAR | Status: DC | PRN

## 2020-07-30 MED ORDER — DOCUSATE SODIUM 100 MG PO CAPS
100.0000 mg | ORAL_CAPSULE | Freq: Two times a day (BID) | ORAL | Status: DC
  Administered 2020-07-30 – 2020-07-31 (×2): 100 mg via ORAL
  Filled 2020-07-30 (×2): qty 1

## 2020-07-30 MED ORDER — METHOCARBAMOL 500 MG PO TABS
500.0000 mg | ORAL_TABLET | Freq: Four times a day (QID) | ORAL | Status: DC | PRN
  Administered 2020-07-30 – 2020-07-31 (×3): 500 mg via ORAL
  Filled 2020-07-30 (×3): qty 1

## 2020-07-30 MED ORDER — TRANEXAMIC ACID-NACL 1000-0.7 MG/100ML-% IV SOLN
1000.0000 mg | Freq: Once | INTRAVENOUS | Status: AC
  Administered 2020-07-30: 1000 mg via INTRAVENOUS
  Filled 2020-07-30: qty 100

## 2020-07-30 MED ORDER — ASPIRIN EC 325 MG PO TBEC
325.0000 mg | DELAYED_RELEASE_TABLET | Freq: Two times a day (BID) | ORAL | Status: DC
  Administered 2020-07-30 – 2020-07-31 (×2): 325 mg via ORAL
  Filled 2020-07-30 (×2): qty 1

## 2020-07-30 MED ORDER — ONDANSETRON HCL 4 MG/2ML IJ SOLN
INTRAMUSCULAR | Status: AC
  Filled 2020-07-30: qty 2

## 2020-07-30 MED ORDER — DEXAMETHASONE SODIUM PHOSPHATE 10 MG/ML IJ SOLN
INTRAMUSCULAR | Status: AC
  Filled 2020-07-30: qty 1

## 2020-07-30 MED ORDER — ACETAMINOPHEN 325 MG PO TABS: 325.0000 mg | ORAL_TABLET | Freq: Four times a day (QID) | ORAL | Status: DC | PRN

## 2020-07-30 MED ORDER — HYDROMORPHONE HCL 1 MG/ML IJ SOLN: 0.5000 mg | INTRAMUSCULAR | Status: DC | PRN

## 2020-07-30 MED ORDER — PROPOFOL 10 MG/ML IV BOLUS
INTRAVENOUS | Status: DC | PRN
  Administered 2020-07-30: 20 mg via INTRAVENOUS
  Administered 2020-07-30: 40 mg via INTRAVENOUS
  Administered 2020-07-30 (×3): 20 mg via INTRAVENOUS

## 2020-07-30 MED ORDER — BUPIVACAINE-EPINEPHRINE 0.5% -1:200000 IJ SOLN
INTRAMUSCULAR | Status: AC
  Filled 2020-07-30: qty 1

## 2020-07-30 MED ORDER — OXYCODONE HCL 5 MG PO TABS
10.0000 mg | ORAL_TABLET | ORAL | Status: DC | PRN
  Administered 2020-07-30 – 2020-07-31 (×2): 10 mg via ORAL
  Filled 2020-07-30: qty 2

## 2020-07-30 MED ORDER — MENTHOL 3 MG MT LOZG: 1.0000 | LOZENGE | OROMUCOSAL | Status: DC | PRN

## 2020-07-30 MED ORDER — MAGNESIUM CITRATE PO SOLN: 1.0000 | Freq: Once | ORAL | Status: DC | PRN

## 2020-07-30 MED ORDER — METOCLOPRAMIDE HCL 5 MG/ML IJ SOLN: 5.0000 mg | Freq: Three times a day (TID) | INTRAMUSCULAR | Status: DC | PRN

## 2020-07-30 MED ORDER — ACETAMINOPHEN 500 MG PO TABS: 1000.0000 mg | ORAL_TABLET | Freq: Once | ORAL | Status: DC | PRN

## 2020-07-30 MED ORDER — LIDOCAINE HCL (CARDIAC) PF 100 MG/5ML IV SOSY
PREFILLED_SYRINGE | INTRAVENOUS | Status: DC | PRN
  Administered 2020-07-30: 40 mg via INTRAVENOUS

## 2020-07-30 MED ORDER — ACETAMINOPHEN 10 MG/ML IV SOLN: 1000.0000 mg | Freq: Once | INTRAVENOUS | Status: DC | PRN

## 2020-07-30 MED ORDER — FENTANYL CITRATE (PF) 100 MCG/2ML IJ SOLN
50.0000 ug | INTRAMUSCULAR | Status: DC
  Administered 2020-07-30: 50 ug via INTRAVENOUS
  Filled 2020-07-30: qty 2

## 2020-07-30 MED ORDER — LACTATED RINGERS IV SOLN: INTRAVENOUS | Status: DC

## 2020-07-30 MED ORDER — PHENYLEPHRINE HCL-NACL 10-0.9 MG/250ML-% IV SOLN
INTRAVENOUS | Status: AC
  Filled 2020-07-30: qty 250

## 2020-07-30 MED ORDER — OXYCODONE HCL 5 MG/5ML PO SOLN: 5.0000 mg | Freq: Once | ORAL | Status: DC | PRN

## 2020-07-30 MED ORDER — SODIUM CHLORIDE 0.9 % IR SOLN
Status: DC | PRN
  Administered 2020-07-30: 1000 mL

## 2020-07-30 MED ORDER — DEXAMETHASONE SODIUM PHOSPHATE 10 MG/ML IJ SOLN
INTRAMUSCULAR | Status: DC | PRN
  Administered 2020-07-30: 10 mg via INTRAVENOUS

## 2020-07-30 MED ORDER — ALUM & MAG HYDROXIDE-SIMETH 200-200-20 MG/5ML PO SUSP: 30.0000 mL | ORAL | Status: DC | PRN

## 2020-07-30 MED ORDER — PHENYLEPHRINE HCL-NACL 10-0.9 MG/250ML-% IV SOLN
INTRAVENOUS | Status: DC | PRN
  Administered 2020-07-30: 40 ug/min via INTRAVENOUS

## 2020-07-30 MED ORDER — PROPOFOL 1000 MG/100ML IV EMUL
INTRAVENOUS | Status: AC
  Filled 2020-07-30: qty 100

## 2020-07-30 MED ORDER — ONDANSETRON HCL 4 MG/2ML IJ SOLN: 4.0000 mg | Freq: Four times a day (QID) | INTRAMUSCULAR | Status: DC | PRN

## 2020-07-30 MED ORDER — ORAL CARE MOUTH RINSE: 15.0000 mL | Freq: Once | OROMUCOSAL | Status: AC

## 2020-07-30 MED ORDER — 0.9 % SODIUM CHLORIDE (POUR BTL) OPTIME
TOPICAL | Status: DC | PRN
  Administered 2020-07-30: 1000 mL

## 2020-07-30 MED ORDER — PROPOFOL 10 MG/ML IV BOLUS
INTRAVENOUS | Status: AC
  Filled 2020-07-30: qty 20

## 2020-07-30 MED ORDER — LACTATED RINGERS IV BOLUS
250.0000 mL | Freq: Once | INTRAVENOUS | Status: AC
  Administered 2020-07-30: 250 mL via INTRAVENOUS

## 2020-07-30 MED ORDER — PHENYLEPHRINE 40 MCG/ML (10ML) SYRINGE FOR IV PUSH (FOR BLOOD PRESSURE SUPPORT)
PREFILLED_SYRINGE | INTRAVENOUS | Status: DC | PRN
  Administered 2020-07-30: 120 ug via INTRAVENOUS
  Administered 2020-07-30: 160 ug via INTRAVENOUS

## 2020-07-30 MED ORDER — ACETAMINOPHEN 160 MG/5ML PO SOLN: 1000.0000 mg | Freq: Once | ORAL | Status: DC | PRN

## 2020-07-30 MED ORDER — METOCLOPRAMIDE HCL 5 MG PO TABS: 5.0000 mg | ORAL_TABLET | Freq: Three times a day (TID) | ORAL | Status: DC | PRN

## 2020-07-30 MED ORDER — BUPIVACAINE-EPINEPHRINE 0.5% -1:200000 IJ SOLN
INTRAMUSCULAR | Status: DC | PRN
  Administered 2020-07-30: 30 mL

## 2020-07-30 MED ORDER — BUPIVACAINE LIPOSOME 1.3 % IJ SUSP
INTRAMUSCULAR | Status: DC | PRN
  Administered 2020-07-30: 20 mL

## 2020-07-30 MED ORDER — SODIUM CHLORIDE (PF) 0.9 % IJ SOLN
INTRAMUSCULAR | Status: AC
  Filled 2020-07-30: qty 50

## 2020-07-30 MED ORDER — OXYCODONE HCL 5 MG PO TABS: 5.0000 mg | ORAL_TABLET | Freq: Once | ORAL | Status: DC | PRN

## 2020-07-30 MED ORDER — DEXAMETHASONE SODIUM PHOSPHATE 10 MG/ML IJ SOLN
10.0000 mg | Freq: Two times a day (BID) | INTRAMUSCULAR | Status: DC
  Administered 2020-07-31: 10 mg via INTRAVENOUS
  Filled 2020-07-30: qty 1

## 2020-07-30 MED ORDER — ONDANSETRON HCL 4 MG/2ML IJ SOLN
INTRAMUSCULAR | Status: DC | PRN
  Administered 2020-07-30: 4 mg via INTRAVENOUS

## 2020-07-30 MED ORDER — PHENOL 1.4 % MT LIQD: 1.0000 | OROMUCOSAL | Status: DC | PRN

## 2020-07-30 MED ORDER — LACTATED RINGERS IV BOLUS
500.0000 mL | Freq: Once | INTRAVENOUS | Status: AC
  Administered 2020-07-30: 500 mL via INTRAVENOUS

## 2020-07-30 MED ORDER — DIPHENHYDRAMINE HCL 12.5 MG/5ML PO ELIX: 12.5000 mg | ORAL_SOLUTION | ORAL | Status: DC | PRN

## 2020-07-30 MED ORDER — PROPOFOL 500 MG/50ML IV EMUL
INTRAVENOUS | Status: DC | PRN
  Administered 2020-07-30: 80 ug/kg/min via INTRAVENOUS

## 2020-07-30 MED ORDER — CEFAZOLIN SODIUM-DEXTROSE 2-4 GM/100ML-% IV SOLN
2.0000 g | Freq: Four times a day (QID) | INTRAVENOUS | Status: AC
  Administered 2020-07-30 (×2): 2 g via INTRAVENOUS
  Filled 2020-07-30 (×2): qty 100

## 2020-07-30 MED ORDER — CHLORHEXIDINE GLUCONATE 0.12 % MT SOLN
15.0000 mL | Freq: Once | OROMUCOSAL | Status: AC
  Administered 2020-07-30: 15 mL via OROMUCOSAL

## 2020-07-30 MED ORDER — OXYCODONE HCL 5 MG PO TABS
5.0000 mg | ORAL_TABLET | ORAL | Status: DC | PRN
  Administered 2020-07-30 – 2020-07-31 (×3): 10 mg via ORAL
  Filled 2020-07-30 (×4): qty 2

## 2020-07-30 MED ORDER — LIDOCAINE 2% (20 MG/ML) 5 ML SYRINGE
INTRAMUSCULAR | Status: AC
  Filled 2020-07-30: qty 5

## 2020-07-30 MED ORDER — ONDANSETRON HCL 4 MG PO TABS: 4.0000 mg | ORAL_TABLET | Freq: Four times a day (QID) | ORAL | Status: DC | PRN

## 2020-07-30 MED ORDER — POLYETHYLENE GLYCOL 3350 17 G PO PACK: 17.0000 g | PACK | Freq: Every day | ORAL | Status: DC | PRN

## 2020-07-30 MED ORDER — METHOCARBAMOL 1000 MG/10ML IJ SOLN
500.0000 mg | Freq: Four times a day (QID) | INTRAVENOUS | Status: DC | PRN
  Filled 2020-07-30: qty 5

## 2020-07-30 MED ORDER — TRANEXAMIC ACID-NACL 1000-0.7 MG/100ML-% IV SOLN
1000.0000 mg | INTRAVENOUS | Status: AC
  Administered 2020-07-30: 1000 mg via INTRAVENOUS
  Filled 2020-07-30: qty 100

## 2020-07-30 MED ORDER — SODIUM CHLORIDE 0.9% FLUSH
INTRAVENOUS | Status: DC | PRN
  Administered 2020-07-30: 50 mL

## 2020-07-30 MED ORDER — DEXAMETHASONE SODIUM PHOSPHATE 10 MG/ML IJ SOLN: 10.0000 mg | Freq: Two times a day (BID) | INTRAMUSCULAR | Status: DC

## 2020-07-30 MED ORDER — WATER FOR IRRIGATION, STERILE IR SOLN
Status: DC | PRN
  Administered 2020-07-30: 2000 mL

## 2020-07-30 SURGICAL SUPPLY — 52 items
ATTUNE MED DOME PAT 41 KNEE (Knees) ×2 IMPLANT
ATTUNE PS FEM LT SZ 8 CEM KNEE (Femur) ×2 IMPLANT
ATTUNE PSRP INSR SZ8 10 KNEE (Insert) ×2 IMPLANT
BAG ZIPLOCK 12X15 (MISCELLANEOUS) ×2 IMPLANT
BASE TIBIAL ATTUNE KNEE SZ9 (Knees) ×1 IMPLANT
BENZOIN TINCTURE PRP APPL 2/3 (GAUZE/BANDAGES/DRESSINGS) ×2 IMPLANT
BLADE SAGITTAL 25.0X1.19X90 (BLADE) ×2 IMPLANT
BLADE SAW SGTL 13.0X1.19X90.0M (BLADE) ×2 IMPLANT
BLADE SURG SZ10 CARB STEEL (BLADE) IMPLANT
BNDG ELASTIC 6X5.8 VLCR STR LF (GAUZE/BANDAGES/DRESSINGS) ×2 IMPLANT
BOOTIES KNEE HIGH SLOAN (MISCELLANEOUS) IMPLANT
BOWL SMART MIX CTS (DISPOSABLE) ×2 IMPLANT
CEMENT HV SMART SET (Cement) ×4 IMPLANT
COVER SURGICAL LIGHT HANDLE (MISCELLANEOUS) ×2 IMPLANT
COVER WAND RF STERILE (DRAPES) IMPLANT
CUFF TOURN SGL QUICK 34 (TOURNIQUET CUFF) ×1
CUFF TRNQT CYL 34X4.125X (TOURNIQUET CUFF) ×1 IMPLANT
DECANTER SPIKE VIAL GLASS SM (MISCELLANEOUS) ×4 IMPLANT
DRAPE U-SHAPE 47X51 STRL (DRAPES) ×2 IMPLANT
DRESSING AQUACEL AG SP 3.5X10 (GAUZE/BANDAGES/DRESSINGS) ×1 IMPLANT
DRSG AQUACEL AG ADV 3.5X10 (GAUZE/BANDAGES/DRESSINGS) ×2 IMPLANT
DRSG AQUACEL AG SP 3.5X10 (GAUZE/BANDAGES/DRESSINGS) ×2
DURAPREP 26ML APPLICATOR (WOUND CARE) ×2 IMPLANT
ELECT REM PT RETURN 15FT ADLT (MISCELLANEOUS) ×2 IMPLANT
GLOVE BIOGEL PI IND STRL 8 (GLOVE) ×2 IMPLANT
GLOVE BIOGEL PI INDICATOR 8 (GLOVE) ×2
GLOVE ECLIPSE 7.5 STRL STRAW (GLOVE) ×4 IMPLANT
GOWN STRL REUS W/TWL XL LVL3 (GOWN DISPOSABLE) ×4 IMPLANT
HANDPIECE INTERPULSE COAX TIP (DISPOSABLE) ×1
HOLDER FOLEY CATH W/STRAP (MISCELLANEOUS) ×2 IMPLANT
HOOD PEEL AWAY FLYTE STAYCOOL (MISCELLANEOUS) ×6 IMPLANT
KIT TURNOVER KIT A (KITS) ×2 IMPLANT
MANIFOLD NEPTUNE II (INSTRUMENTS) ×2 IMPLANT
NEEDLE HYPO 22GX1.5 SAFETY (NEEDLE) ×4 IMPLANT
NS IRRIG 1000ML POUR BTL (IV SOLUTION) ×2 IMPLANT
PACK ICE MAXI GEL EZY WRAP (MISCELLANEOUS) ×2 IMPLANT
PACK TOTAL KNEE CUSTOM (KITS) ×2 IMPLANT
PADDING CAST COTTON 6X4 STRL (CAST SUPPLIES) ×2 IMPLANT
PENCIL SMOKE EVACUATOR (MISCELLANEOUS) IMPLANT
PIN DRILL FIX HALF THREAD (BIT) ×2 IMPLANT
PIN STEINMAN FIXATION KNEE (PIN) ×2 IMPLANT
PROTECTOR NERVE ULNAR (MISCELLANEOUS) ×2 IMPLANT
SET HNDPC FAN SPRY TIP SCT (DISPOSABLE) ×1 IMPLANT
STRIP CLOSURE SKIN 1/2X4 (GAUZE/BANDAGES/DRESSINGS) ×2 IMPLANT
SUT MNCRL AB 3-0 PS2 18 (SUTURE) ×2 IMPLANT
SUT VIC AB 0 CT1 36 (SUTURE) ×2 IMPLANT
SUT VIC AB 1 CT1 36 (SUTURE) ×4 IMPLANT
SYR CONTROL 10ML LL (SYRINGE) ×4 IMPLANT
TIBIAL BASE ATTUNE KNEE SZ9 (Knees) ×2 IMPLANT
TRAY FOLEY MTR SLVR 16FR STAT (SET/KITS/TRAYS/PACK) ×2 IMPLANT
WATER STERILE IRR 1000ML POUR (IV SOLUTION) ×4 IMPLANT
WRAP KNEE MAXI GEL POST OP (GAUZE/BANDAGES/DRESSINGS) ×2 IMPLANT

## 2020-07-30 NOTE — Evaluation (Signed)
Physical Therapy Evaluation Patient Details Name: [name] [name] MRN: [id number] DOB: 1961-07-11 Today's Date: 07/30/2020   History of Present Illness  Patient is 59 y.o. male s/p Lt TKA on 07/30/20 with PMH of V-tach, CHF, obesity, cardiomyopathy, HTN, hx of ETOH abuse, COPD, CAD, A-fib, OA, Rt THA,  Rt TKA on 01/30/20.    Clinical Impression  [name] [name] is a 59 y.o. male POD 0 s/p Lt TKA. Patient reports independence with mobility at baseline. Patient is now limited by functional impairments (see PT problem list below) and requires min assist/guard for transfers and gait with RW. Patient was able to ambulate ~250 feet with RW and min guard/assist. Patient instructed in exercise to facilitate ROM and circulation. Patient will benefit from continued skilled PT interventions to address impairments and progress towards PLOF. Acute PT will follow to progress mobility and stair training in preparation for safe discharge home.     Follow Up Recommendations Follow surgeon's recommendation for DC plan and follow-up therapies;Outpatient PT    Equipment Recommendations  None recommended by PT    Recommendations for Other Services       Precautions / Restrictions Precautions Precautions: Fall Restrictions Weight Bearing Restrictions: No LLE Weight Bearing: Weight bearing as tolerated      Mobility  Bed Mobility Overal bed mobility: Needs Assistance Bed Mobility: Supine to Sit     Supine to sit: Min guard;HOB elevated     General bed mobility comments: pt using bed rail and HOB elevated, no assist required, guarding for safety.    Transfers Overall transfer level: Needs assistance Equipment used: Rolling walker (2 wheeled) Transfers: Sit to/from Stand Sit to Stand: Min guard;From elevated surface         General transfer comment: cues for safe hand placement and technique for power up, guarding for safety.   Ambulation/Gait Ambulation/Gait assistance: Min guard;Min assist Gait  Distance (Feet): 250 Feet Assistive device: Rolling walker (2 wheeled) Gait Pattern/deviations: Step-to pattern;Step-through pattern;Decreased stride length Gait velocity: fair   General Gait Details: cues for safe step to pattern but pt preferring step through, no overt LOB noted and no buckling at Lt knee. occasional assist for walker position.  Stairs            Wheelchair Mobility    Modified Rankin (Stroke Patients Only)       Balance Overall balance assessment: Mild deficits observed, not formally tested                                           Pertinent Vitals/Pain Pain Assessment: No/denies pain    Home Living Family/patient expects to be discharged to:: Private residence Living Arrangements: Alone Available Help at Discharge: Friend(s) Type of Home: House Home Access: Stairs to enter;Ramped entrance Entrance Stairs-Rails: Can reach both Entrance Stairs-Number of Steps: 4 at back; 3 at front Home Layout: One level Home Equipment: Bedside commode;Hand held shower head;Walker - 2 wheels;Cane - single point Additional Comments: ramp set up at back of house    Prior Function Level of Independence: Independent         Comments: pt retired and enjoys working around the house     [employer]   Dominant Hand: Right    Extremity/Trunk Assessment   Upper Extremity Assessment Upper Extremity Assessment: Overall WFL for tasks assessed    Lower Extremity Assessment Lower Extremity Assessment: LLE deficits/detail LLE Deficits /  Details: good quad activation, no extensor lag with SLR LLE Sensation: WNL LLE Coordination: WNL    Cervical / Trunk Assessment Cervical / Trunk Assessment: Normal;Other exceptions Cervical / Trunk Exceptions: large habitus  Communication   Communication: No difficulties  Cognition Arousal/Alertness: Awake/alert Behavior During Therapy: WFL for tasks assessed/performed Overall Cognitive Status: Within  Functional Limits for tasks assessed                                        General Comments      Exercises Total Joint Exercises Ankle Circles/Pumps: AROM;Both;20 reps;Seated Quad Sets: AROM;Left;10 reps;Seated Heel Slides: AROM;Left;10 reps;Seated   Assessment/Plan    PT Assessment Patient needs continued PT services  PT Problem List Decreased strength;Decreased activity tolerance;Decreased range of motion;Decreased balance;Decreased mobility;Decreased knowledge of use of DME;Decreased knowledge of precautions;Obesity       PT Treatment Interventions DME instruction;Gait training;Stair training;Functional mobility training;Therapeutic activities;Therapeutic exercise;Balance training;Patient/family education    PT Goals (Current goals can be found in the Care Plan section)  Acute Rehab PT Goals Patient Stated Goal: get back home and recover independence to play with his grandson PT Goal Formulation: With patient Time For Goal Achievement: 08/06/20 Potential to Achieve Goals: Good    Frequency 7X/week   Barriers to discharge        Co-evaluation               AM-PAC PT "6 Clicks" Mobility  Outcome Measure Help needed turning from your back to your side while in a flat bed without using bedrails?: None Help needed moving from lying on your back to sitting on the side of a flat bed without using bedrails?: A Little Help needed moving to and from a bed to a chair (including a wheelchair)?: A Little Help needed standing up from a chair using your arms (e.g., wheelchair or bedside chair)?: A Little Help needed to walk in hospital room?: A Little Help needed climbing 3-5 steps with a railing? : A Little 6 Click Score: 19    End of Session Equipment Utilized During Treatment: Gait belt Activity Tolerance: Patient tolerated treatment well Patient left: in chair;with call bell/phone within reach;with chair alarm set;with family/visitor present Nurse  Communication: Mobility status PT Visit Diagnosis: Muscle weakness (generalized) (M62.81);Difficulty in walking, not elsewhere classified (R26.2)    Time: 7096-2836 PT Time Calculation (min) (ACUTE ONLY): 31 min   Charges:   PT Evaluation $PT Eval Low Complexity: 1 Low PT Treatments $Gait Training: 8-22 mins        Wynn Maudlin, DPT Acute Rehabilitation Services  Office [phone] Pager [phone]  07/30/2020 3:36 PM

## 2020-07-30 NOTE — Transfer of Care (Signed)
Immediate Anesthesia Transfer of Care Note  Patient: Horrace Morella  Procedure(s) Performed: TOTAL KNEE ARTHROPLASTY (Left Knee)  Patient Location: PACU  Anesthesia Type:Spinal  Level of Consciousness: awake, alert , oriented and patient cooperative  Airway & Oxygen Therapy: Patient Spontanous Breathing and Patient connected to face mask oxygen  Post-op Assessment: Report given to RN and Post -op Vital signs reviewed and stable  Post vital signs: Reviewed and stable  Last Vitals:  Vitals Value Taken Time  BP 94/51 07/30/20 1145  Temp    Pulse 51 07/30/20 1145  Resp 12 07/30/20 1146  SpO2 98 % 07/30/20 1145  Vitals shown include unvalidated device data.  Last Pain:  Vitals:   07/30/20 0856  TempSrc:   PainSc: 0-No pain      Patients Stated Pain Goal: 4 (07/30/20 0645)  Complications: No complications documented.

## 2020-07-30 NOTE — Anesthesia Procedure Notes (Signed)
Spinal  Patient location during procedure: OR Start time: 07/30/2020 9:24 AM End time: 07/30/2020 9:28 AM Staffing Performed: resident/CRNA  Anesthesiologist: Val Eagle, MD Resident/CRNA: Yolonda Kida, CRNA Preanesthetic Checklist Completed: patient identified, IV checked, site marked, risks and benefits discussed, surgical consent, monitors and equipment checked, pre-op evaluation and timeout performed Spinal Block Patient position: sitting Prep: DuraPrep Patient monitoring: heart rate, continuous pulse ox and blood pressure Approach: midline Location: L3-4 Injection technique: single-shot Needle Needle type: Pencan  Needle gauge: 24 G Assessment Sensory level: T6

## 2020-07-30 NOTE — Anesthesia Procedure Notes (Signed)
Anesthesia Regional Block: Adductor canal block   Pre-Anesthetic Checklist: ,, timeout performed, Correct Patient, Correct Site, Correct Laterality, Correct Procedure, Correct Position, site marked, Risks and benefits discussed,  Surgical consent,  Pre-op evaluation,  At surgeon's request and post-op pain management  Laterality: Left and Lower  Prep: chloraprep       Needles:  Injection technique: Single-shot     Needle Length: 9cm  Needle Gauge: 22     Additional Needles: Arrow StimuQuik ECHO Echogenic Stimulating PNB Needle  Procedures:,,,, ultrasound used (permanent image in chart),,,,  Narrative:  Start time: 07/30/2020 8:50 AM End time: 07/30/2020 8:54 AM Injection made incrementally with aspirations every 5 mL.  Performed by: Personally  Anesthesiologist: Val Eagle, MD

## 2020-07-30 NOTE — Progress Notes (Signed)
Orthopedic Tech Progress Note Patient Details:  [name] [name] 10/24/1960 [id number]  CPM Left Knee CPM Left Knee: On Left Knee Flexion (Degrees): 90 Left Knee Extension (Degrees): 0  Post Interventions Patient Tolerated: Well Instructions Provided: Care of device  Saul Fordyce 07/30/2020, 2:23 PM

## 2020-07-30 NOTE — Progress Notes (Signed)
Assisted Dr. Moser with left, ultrasound guided, adductor canal block. Side rails up, monitors on throughout procedure. See vital signs in flow sheet. Tolerated Procedure well.  

## 2020-07-30 NOTE — Discharge Instructions (Signed)
INSTRUCTIONS AFTER JOINT REPLACEMENT   o Remove items at home which could result in a fall. This includes throw rugs or furniture in walking pathways o ICE to the affected joint every three hours while awake for 30 minutes at a time, for at least the first 3-5 days, and then as needed for pain and swelling.  Continue to use ice for pain and swelling. You may notice swelling that will progress down to the foot and ankle.  This is normal after surgery.  Elevate your leg when you are not up walking on it.   o Continue to use the breathing machine you got in the hospital (incentive spirometer) which will help keep your temperature down.  It is common for your temperature to cycle up and down following surgery, especially at night when you are not up moving around and exerting yourself.  The breathing machine keeps your lungs expanded and your temperature down.   DIET:  As you were doing prior to hospitalization, we recommend a well-balanced diet.  DRESSING / WOUND CARE / SHOWERING  You may remove the ace bandage from your knee 3 days after surgery.  Keep the waterproof bandage underneath in place.  Do not apply creams, lotions or creams on or around the waterproof bandage.  ACTIVITY  o Increase activity slowly as tolerated, but follow the weight bearing instructions below.   o No driving for 6 weeks or until further direction given by your physician.  You cannot drive while taking narcotics.  o No lifting or carrying greater than 10 lbs. until further directed by your surgeon. o Avoid periods of inactivity such as sitting longer than an hour when not asleep. This helps prevent blood clots.  o You may return to work once you are authorized by your doctor.     WEIGHT BEARING   Weight bearing as tolerated with assist device (walker, cane, etc) as directed, use it as long as suggested by your surgeon or therapist, typically at least 4-6 weeks.   EXERCISES  Results after joint replacement  surgery are often greatly improved when you follow the exercise, range of motion and muscle strengthening exercises prescribed by your doctor. Safety measures are also important to protect the joint from further injury. Any time any of these exercises cause you to have increased pain or swelling, decrease what you are doing until you are comfortable again and then slowly increase them. If you have problems or questions, call your caregiver or physical therapist for advice.   Rehabilitation is important following a joint replacement. After just a few days of immobilization, the muscles of the leg can become weakened and shrink (atrophy).  These exercises are designed to build up the tone and strength of the thigh and leg muscles and to improve motion. Often times heat used for twenty to thirty minutes before working out will loosen up your tissues and help with improving the range of motion but do not use heat for the first two weeks following surgery (sometimes heat can increase post-operative swelling).   These exercises can be done on a training (exercise) mat, on the floor, on a table or on a bed. Use whatever works the best and is most comfortable for you.    Use music or television while you are exercising so that the exercises are a pleasant break in your day. This will make your life better with the exercises acting as a break in your routine that you can look forward to.  Perform all exercises about fifteen times, three times per day or as directed.  You should exercise both the operative leg and the other leg as well.  Exercises include:   . Quad Sets - Tighten up the muscle on the front of the thigh (Quad) and hold for 5-10 seconds.   . Straight Leg Raises - With your knee straight (if you were given a brace, keep it on), lift the leg to 60 degrees, hold for 3 seconds, and slowly lower the leg.  Perform this exercise against resistance later as your leg gets stronger.  . Leg Slides: Lying on  your back, slowly slide your foot toward your buttocks, bending your knee up off the floor (only go as far as is comfortable). Then slowly slide your foot back down until your leg is flat on the floor again.  [name] [name] Wings: Lying on your back spread your legs to the side as far apart as you can without causing discomfort.  . Hamstring Strength:  Lying on your back, push your heel against the floor with your leg straight by tightening up the muscles of your buttocks.  Repeat, but this time bend your knee to a comfortable angle, and push your heel against the floor.  You may put a pillow under the heel to make it more comfortable if necessary.   A rehabilitation program following joint replacement surgery can speed recovery and prevent re-injury in the future due to weakened muscles. Contact your doctor or a physical therapist for more information on knee rehabilitation.    CONSTIPATION  Constipation is defined medically as fewer than three stools per week and severe constipation as less than one stool per week.  Even if you have a regular bowel pattern at home, your normal regimen is likely to be disrupted due to multiple reasons following surgery.  Combination of anesthesia, postoperative narcotics, change in appetite and fluid intake all can affect your bowels.   YOU MUST use at least one of the following options; they are listed in order of increasing strength to get the job done.  They are all available over the counter, and you may need to use some, POSSIBLY even all of these options:    Drink plenty of fluids (prune juice may be helpful) and high fiber foods Colace 100 mg by mouth twice a day  Senokot for constipation as directed and as needed Dulcolax (bisacodyl), take with full glass of water  Miralax (polyethylene glycol) once or twice a day as needed.  If you have tried all these things and are unable to have a bowel movement in the first 3-4 days after surgery call either your surgeon or  your primary doctor.    If you experience loose stools or diarrhea, hold the medications until you stool forms back up.  If your symptoms do not get better within 1 week or if they get worse, check with your doctor.  If you experience "the worst abdominal pain ever" or develop nausea or vomiting, please contact the office immediately for further recommendations for treatment.   ITCHING:  If you experience itching with your medications, try taking only a single pain pill, or even half a pain pill at a time.  You can also use Benadryl over the counter for itching or also to help with sleep.   TED HOSE STOCKINGS:  Use stockings on both legs until for at least 2 weeks or as directed by physician office. They may be removed at night for  sleeping.  MEDICATIONS:  See your medication summary on the "After Visit Summary" that nursing will review with you.  You may have some home medications which will be placed on hold until you complete the course of blood thinner medication.  It is important for you to complete the blood thinner medication as prescribed.  PRECAUTIONS:  If you experience chest pain or shortness of breath - call 911 immediately for transfer to the hospital emergency department.   If you develop a fever greater that 101 F, purulent drainage from wound, increased redness or drainage from wound, foul odor from the wound/dressing, or calf pain - CONTACT YOUR SURGEON.                                                   FOLLOW-UP APPOINTMENTS:  If you do not already have a post-op appointment, please call the office for an appointment to be seen by your surgeon.  Guidelines for how soon to be seen are listed in your "After Visit Summary", but are typically between 1-4 weeks after surgery.  OTHER INSTRUCTIONS:   Knee Replacement:  Do not place pillow under knee, focus on keeping the knee straight while resting. CPM instructions: 0-90 degrees, 2 hours in the morning, 2 hours in the afternoon, and 2  hours in the evening. Place foam block, curve side up under heel at all times except when in CPM or when walking.  DO NOT modify, tear, cut, or change the foam block in any way.   DENTAL ANTIBIOTICS:  In most cases prophylactic antibiotics for Dental procdeures after total joint surgery are not necessary.  Exceptions are as follows:  1. History of prior total joint infection  2. Severely immunocompromised (Organ Transplant, cancer chemotherapy, Rheumatoid biologic meds such as Humera)  3. Poorly controlled diabetes (A1C &gt; 8.0, blood glucose over 200)  If you have one of these conditions, contact your surgeon for an antibiotic prescription, prior to your dental procedure.   MAKE SURE YOU:  . Understand these instructions.  . Get help right away if you are not doing well or get worse.    Thank you for letting us be a part of your medical care team.  It is a privilege we respect greatly.  We hope these instructions will help you stay on track for a fast and full recovery!

## 2020-07-30 NOTE — Op Note (Signed)
PATIENT ID:      [name] [name]  MRN:     [id number] DOB/AGE:    1961-03-24 / 59 y.o.       OPERATIVE REPORT   DATE OF PROCEDURE:  07/30/2020      PREOPERATIVE DIAGNOSIS:   LEFT KNEE OSTEOARTHRITIS      Estimated body mass index is 40.14 kg/m as calculated from the following:   Height as of this encounter: 6\' 4"  (1.93 m).   Weight as of this encounter: 149.6 kg.                                                       POSTOPERATIVE DIAGNOSIS:   Same                                                                  PROCEDURE:  Procedure(s): TOTAL KNEE ARTHROPLASTY Using DepuyAttune RP implants #8 Femur, #9Tibia, 10 mm Attune RP bearing, 41 Patella    SURGEON:  ASSISTANT:  Harvie Junior PA-C   (Present and scrubbed throughout the case, critical for assistance with exposure, retraction, instrumentation, and closure.)        ANESTHESIA: spinal, 20cc Exparel, 50cc 0.25% Marcaine EBL: min cc FLUID REPLACEMENT: unk cc crystaloid TOURNIQUET: DRAINS: None TRANEXAMIC ACID: 1gm IV, 2gm topical COMPLICATIONS:  None         INDICATIONS FOR PROCEDURE: The patient has  LEFT KNEE OSTEOARTHRITIS, varus deformities, XR shows bone on bone arthritis, lateral subluxation of tibia. Patient has failed all conservative measures including anti-inflammatory medicines, narcotics, attempts at exercise and weight loss, cortisone injections and viscosupplementation.  Risks and benefits of surgery have been discussed, questions answered.   DESCRIPTION OF PROCEDURE: The patient identified by armband, received  IV antibiotics, in the holding area at [facility]. Patient taken to the operating room, appropriate anesthetic monitors were attached, and spinal anesthesia was  induced. IV Tranexamic acid was given.Tourniquet applied high to the operative thigh. Lateral post and foot positioner applied to the table, the lower extremity was then prepped and draped in usual sterile fashion from the toes to the  tourniquet. Time-out procedure was performed. [facility], was present and scrubbed throughout the case, critical for assistance with, positioning, exposure, retraction, instrumentation, and closure.The skin and subcutaneous tissue along the incision was injected with 20 cc of a mixture of Exparel and Marcaine solution, using a 20-gauge by 1-1/2 inch needle. We began the operation, with the knee flexed 130 degrees, by making the anterior midline incision starting at handbreadth above the patella going over the patella 1 cm medial to and 4 cm distal to the tibial tubercle. Small bleeders in the skin and the subcutaneous tissue identified and cauterized. Transverse retinaculum was incised and reflected medially and a medial parapatellar arthrotomy was accomplished. the patella was everted and theprepatellar fat pad resected. The superficial medial collateral ligament was then elevated from anterior to posterior along the proximal flare of the tibia and anterior half of the menisci resected. The knee was hyperflexed exposing bone on bone arthritis. Peripheral and notch osteophytes as well  as the cruciate ligaments were then resected. We continued to work our way around posteriorly along the proximal tibia, and externally rotated the tibia subluxing it out from underneath the femur. A McHale PCL retractor was placed through the notch and a lateral Hohmann retractor placed, and we then entered the proximal tibia in line with the Depuy starter drill in line with the axis of the tibia followed by an intramedullary guide rod and 0-degree posterior slope cutting guide. The tibial cutting guide, 4 degree posterior sloped, was pinned into place allowing resection of 3 mm of bone medially and 10 mm of bone laterally. Satisfied with the tibial resection, we then entered the distal femur 2 mm anterior to the PCL origin with the intramedullary guide rod and applied the distal femoral cutting guide set at 9 mm, with 5 degrees of  valgus. This was pinned along the epicondylar axis. At this point, the distal femoral cut was accomplished without difficulty. We then sized for a #8 femoral component and pinned the guide in 3 degrees of external rotation. The chamfer cutting guide was pinned into place. The anterior, posterior, and chamfer cuts were accomplished without difficulty followed by the Attune RP box cutting guide and the box cut. We also removed posterior osteophytes from the posterior femoral condyles. The posterior capsule was injected with Exparel solution. The knee was brought into full extension. We checked our extension gap and fit a 10 mm bearing. Distracting in extension with a lamina spreader,  bleeders in the posterior capsule, Posterior medial and posterior lateral gutter were cauterized.  The transexamic acid-soaked sponge was then placed in the gap of the knee in extension. The knee was flexed 30. The posterior patella cut was accomplished with the 9.5 mm Attune cutting guide, sized for a 25mm dome, and the fixation pegs drilled.The knee was then once again hyperflexed exposing the proximal tibia. We sized for a # 9 tibial base plate, applied the smokestack and the conical reamer followed by the the Delta fin keel punch. We then hammered into place the Attune RP trial femoral component, drilled the lugs, inserted a  10 mm trial bearing, trial patellar button, and took the knee through range of motion from 0-130 degrees. Medial and lateral ligamentous stability was checked. No thumb pressure was required for patellar Tracking. The tourniquet was released at 62 min. All trial components were removed, mating surfaces irrigated with pulse lavage, and dried with suction and sponges. 10 cc of the Exparel solution was applied to the cancellus bone of the patella distal femur and proximal tibia.  After waiting 30 seconds, the bony surfaces were again, dried with sponges. A double batch of DePuy HV cement was mixed and applied to  all bony metallic mating surfaces except for the posterior condyles of the femur itself. In order, we hammered into place the tibial tray and removed excess cement, the femoral component and removed excess cement. The final Attune RP bearing was inserted, and the knee brought to full extension with compression. The patellar button was clamped into place, and excess cement removed. The knee was held at 30 flexion with compression, while the cement cured. The wound was irrigated out with normal saline solution pulse lavage. The rest of the Exparel was injected into the parapatellar arthrotomy, subcutaneous tissues, and periosteal tissues. The parapatellar arthrotomy was closed with running #1 Vicryl suture. The subcutaneous tissue with 3-0 undyed Vicryl suture, and the skin with running 3-0 SQ vicryl. An Aquacil and Ace wrap were  applied. The patient was taken to recovery room without difficulty.   Harvie Junior 07/30/2020, 10:57 AM

## 2020-07-30 NOTE — H&P (Signed)
TOTAL KNEE ADMISSION H&P  Patient is being admitted for left total knee arthroplasty.  Subjective:  Chief Complaint:left knee pain.  HPI: [name] [name], 59 y.o. male, has a history of pain and functional disability in the left knee due to arthritis and has failed non-surgical conservative treatments for greater than 12 weeks to includeNSAID's and/or analgesics, corticosteriod injections, viscosupplementation injections, flexibility and strengthening excercises, weight reduction as appropriate and activity modification.  Onset of symptoms was gradual, starting 4 years ago with gradually worsening course since that time. The patient noted no past surgery on the left knee(s).  Patient currently rates pain in the left knee(s) at 9 out of 10 with activity. Patient has night pain, worsening of pain with activity and weight bearing, pain that interferes with activities of daily living, pain with passive range of motion, crepitus and joint swelling.  Patient has evidence of subchondral sclerosis by imaging studies. This patient has had failure of all rreasonable conservative care. There is no active infection.  Patient Active Problem List   Diagnosis Date Noted  . Ventricular tachycardia ([place]) 04/17/2020  . Primary osteoarthritis of right knee 01/30/2020  . Hypokalemia 11/23/2019  . Primary osteoarthritis of right hip 11/03/2018  . VT (ventricular tachycardia) ([place]) 05/30/2017  . AKI (acute kidney injury) ([place])   . Obesity 12/25/2016  . OSA (obstructive sleep apnea) 12/25/2016  . Hyperglycemia 12/25/2016  . Acute respiratory failure with hypoxia ([place]) 12/25/2016  . Chronic anticoagulation 06/27/2016  . Encounter for therapeutic drug monitoring 04/19/2015  . S/P ICD (internal cardiac defibrillator) procedure Medtronic CRT ICD 04/15/2015  . Atrial fibrillation with RVR - Paroxysmal 04/08/2015  . NICM 04/06/2015  . Troponin level elevated 04/06/2015  . Rectal bleeding 10/26/2011  . Hydrocele, left  05/24/2011  . Tobacco use disorder- 1/2 pk a day   . Alcohol abuse   . Obesity 09/27/2008  . CAD- minor CAD 2009 and 2016 09/27/2008  . VENTRICULAR TACHYCARDIA 09/27/2008  . Essential hypertension 06/19/2008  . Chronic systolic heart failure ([place]) 06/19/2008   Past Medical History:  Diagnosis Date  . AICD (automatic cardioverter/defibrillator) present   . Arthritis   . Atrial fibrillation ([place])   . CAD (coronary artery disease)    Mild nonobstructive ([place] 09)  . Chronic systolic heart failure (HCC)    Nonischemic CM: echo 4/12 with EF 25-30%, grade 2 diast dysfxn, mild dilated aortic root 43 mm, trivial MR, mod LAE  . COPD (chronic obstructive pulmonary disease) ([place])   . Dyspnea    with exertion   . Ejection fraction < 50% 08/2019   20-25% noted on ECHO  . H/O ETOH abuse   . HTN (hypertension)   . Hypersomnia   . Nonischemic cardiomyopathy ([place])   . Obesity, morbid ([place])   . OSA on CPAP   . Systolic and diastolic CHF, acute on chronic ([place]) 11/2016  . Tobacco use disorder   . Ventricular tachycardia [facility])     Past Surgical History:  Procedure Laterality Date  . CARDIAC CATHETERIZATION N/A 04/08/2015   Procedure: Right/Left Heart Cath and Coronary Angiography;  Surgeon: Jettie Booze, MD;  Location: [place] [facility];  Service: Cardiovascular;  Laterality: N/A;  . EP IMPLANTABLE DEVICE N/A 04/14/2015   Procedure: BiV ICD Insertion CRT-D;  Surgeon: Deboraha Sprang, MD;  Location: [place] [facility];  Service: Cardiovascular;  Laterality: N/A;  . HYDROCELE EXCISION / REPAIR    . ICD IMPLANT    . TOTAL HIP ARTHROPLASTY Right 11/04/2018  Procedure: TOTAL HIP ARTHROPLASTY ANTERIOR APPROACH;  Surgeon: Dorna Leitz, MD;  Location: [facility];  Service: Orthopedics;  Laterality: Right;  . TOTAL KNEE ARTHROPLASTY Right 01/30/2020   Procedure: TOTAL KNEE ARTHROPLASTY;  Surgeon: Dorna Leitz, MD;  Location: [facility];  Service: Orthopedics;  Laterality: Right;  . TUMOR EXCISION Left    . 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 VASECTOMY      Current Facility-Administered Medications  Medication Dose Route Frequency Provider Last Rate Last Admin  . bupivacaine liposome (EXPAREL) 1.3 % injection 266 mg  20 mL Other Once Dorna Leitz, MD      . ceFAZolin (ANCEF) 3 g in dextrose 5 % 50 mL IVPB  3 g Intravenous On Call to OR Dorna Leitz, MD      . fentaNYL (SUBLIMAZE) injection 50-100 mcg  50-100 mcg Intravenous Reola Mosher, MD   50 mcg at 07/30/20 [phone]  . lactated ringers infusion   Intravenous Continuous Ellender, Karyl Kinnier, MD 10 mL/hr at 07/30/20 3382 Continued from Pre-op at 07/30/20 0828  . midazolam (VERSED) injection 1-2 mg  1-2 mg Intravenous Reola Mosher, MD   1 mg at 07/30/20 0852  . phenylephrine (NEOSYNEPHRINE) 10-0.9 MG/250ML-% infusion           . tranexamic acid (CYKLOKAPRON) IVPB 1,000 mg  1,000 mg Intravenous To OR Dorna Leitz, MD       Facility-Administered Medications Ordered in Other Encounters  Medication Dose Route Frequency Provider Last Rate Last Admin  . ropivacaine (PF) 7.5 mg/mL (0.75%) (NAROPIN) injection   Peri-NEURAL Anesthesia Intra-op Oleta Mouse, MD   20 mL at 07/30/20 0854   No Known Allergies  Social History   Tobacco Use  . Smoking status: Former Smoker    Packs/day: 0.50    Years: 4.00    Pack years: 2.00    Types: Cigarettes    Quit date: 10/29/2005    Years since quitting: 14.7  . Smokeless tobacco: Never Used  . Tobacco comment: about a pack a week. social  Substance Use Topics  . Alcohol use: Yes    Comment: 12 beers per week     Family History  Problem Relation Age of Onset  . Other Mother        cardiac surgery. late 1990s  . Heart Problems Mother        CABG AGE 44  . Congestive Heart Failure Father   . Healthy Father        AGE 42  . Coronary artery disease Other   . Healthy Brother        AGE 73  . Healthy Sister        AGE 17  . Healthy Sister        AGE 42   . Healthy Son   . Healthy Son   . Healthy Daughter      Review of Systems ROS: I have reviewed the patient's review of systems thoroughly and there are no positive responses as relates to the HPI. Objective:  Physical Exam  Vital signs in last 24 hours: Temp:  [98.5 F (36.9 C)] 98.5 F (36.9 C) (11/12 0711) Pulse Rate:  [55-63] 61 (11/12 0856) Resp:  [18-20] 20 (11/12 0856) BP: (92-140)/(63-78) 140/78 (11/12 0856) SpO2:  [94 %-99 %] 98 % (11/12 0856) Weight:  [149.6 kg] 149.6 kg (11/12 0730) Well-developed well-nourished patient in no acute distress. Alert and oriented x3 HEENT:within normal limits Cardiac: Regular rate  and rhythm Pulmonary: Lungs clear to auscultation Abdomen: Soft and nontender.  Normal active bowel sounds  Musculoskeletal: l knee: painful rom limited rom no instability Labs: Recent Results (from the past 2160 hour(s))  Basic metabolic panel     Status: Abnormal   Collection Time: 05/05/20  2:15 PM  Result Value Ref Range   Glucose 88 65 - 99 mg/dL   BUN 20 6 - 24 mg/dL   Creatinine, Ser 1.35 (H) 0.76 - 1.27 mg/dL   GFR calc non Af Amer 58 (L) >59 mL/min/1.73   GFR calc Af Amer 67 >59 mL/min/1.73    Comment: **[facility] currently reports eGFR in compliance with the current**   recommendations of the [employer]. [facility] will   update reporting as new guidelines are published from the NKF-ASN   Task force.    BUN/Creatinine Ratio 15 9 - 20   Sodium 139 134 - 144 mmol/L   Potassium 4.7 3.5 - 5.2 mmol/L   Chloride 99 96 - 106 mmol/L   CO2 29 20 - 29 mmol/L   Calcium 9.4 8.7 - 10.2 mg/dL  Magnesium     Status: Abnormal   Collection Time: 05/05/20  2:15 PM  Result Value Ref Range   Magnesium 2.5 (H) 1.6 - 2.3 mg/dL  Hepatic function panel     Status: None   Collection Time: 05/05/20  2:15 PM  Result Value Ref Range   Total Protein 6.4 6.0 - 8.5 g/dL   Albumin 4.4 3.8 - 4.9 g/dL   Bilirubin Total 0.7 0.0 - 1.2 mg/dL   Bilirubin,  Direct 0.21 0.00 - 0.40 mg/dL   Alkaline Phosphatase 112 48 - 121 IU/L   AST 9 0 - 40 IU/L   ALT 10 0 - 44 IU/L  TSH     Status: None   Collection Time: 05/05/20  2:15 PM  Result Value Ref Range   TSH 1.240 0.450 - 4.500 uIU/mL  Basic metabolic panel     Status: Abnormal   Collection Time: 05/10/20  9:49 AM  Result Value Ref Range   Sodium 136 135 - 145 mmol/L   Potassium 4.1 3.5 - 5.1 mmol/L   Chloride 99 98 - 111 mmol/L   CO2 27 22 - 32 mmol/L   Glucose, Bld 160 (H) 70 - 99 mg/dL    Comment: Glucose reference range applies only to samples taken after fasting for at least 8 hours.   BUN 15 6 - 20 mg/dL   Creatinine, Ser 1.27 (H) 0.61 - 1.24 mg/dL   Calcium 9.2 8.9 - 10.3 mg/dL   GFR calc non Af Amer >60 >60 mL/min   GFR calc Af Amer >60 >60 mL/min   Anion gap 10 5 - 15    Comment: Performed at [facility] [address] [place], [place] [address]  APTT     Status: None   Collection Time: 07/19/20 10:43 AM  Result Value Ref Range   aPTT 31 24 - 36 seconds    Comment: Performed at [facility], [place] [address], [place], [place] [address]  CBC WITH DIFFERENTIAL     Status: Abnormal   Collection Time: 07/19/20 10:43 AM  Result Value Ref Range   WBC 4.4 4.0 - 10.5 K/uL   RBC 3.55 (L) 4.22 - 5.81 MIL/uL   Hemoglobin 11.9 (L) 13.0 - 17.0 g/dL   HCT 35.7 (L) 39 - 52 %   MCV 100.6 (H) 80.0 - 100.0 fL  MCH 33.5 26.0 - 34.0 pg   MCHC 33.3 30.0 - 36.0 g/dL   RDW 13.7 11.5 - 15.5 %   Platelets 168 150 - 400 K/uL   nRBC 0.0 0.0 - 0.2 %   Neutrophils Relative % 59 %   Neutro Abs 2.7 1.7 - 7.7 K/uL   Lymphocytes Relative 26 %   Lymphs Abs 1.1 0.7 - 4.0 K/uL   Monocytes Relative 7 %   Monocytes Absolute 0.3 0.1 - 1.0 K/uL   Eosinophils Relative 6 %   Eosinophils Absolute 0.3 0.0 - 0.5 K/uL   Basophils Relative 1 %   Basophils Absolute 0.0 0.0 - 0.1 K/uL   Immature Granulocytes 1 %   Abs Immature Granulocytes 0.02 0.00 - 0.07 K/uL    Comment: Performed  at [facility], [place] [address] [place], [place] [address]  Comprehensive metabolic panel     Status: Abnormal   Collection Time: 07/19/20 10:43 AM  Result Value Ref Range   Sodium 142 135 - 145 mmol/L   Potassium 4.7 3.5 - 5.1 mmol/L   Chloride 100 98 - 111 mmol/L   CO2 31 22 - 32 mmol/L   Glucose, Bld 137 (H) 70 - 99 mg/dL    Comment: Glucose reference range applies only to samples taken after fasting for at least 8 hours.   BUN 15 6 - 20 mg/dL   Creatinine, Ser 1.24 0.61 - 1.24 mg/dL   Calcium 9.6 8.9 - 10.3 mg/dL   Total Protein 7.5 6.5 - 8.1 g/dL   Albumin 4.4 3.5 - 5.0 g/dL   AST 16 15 - 41 U/L   ALT 20 0 - 44 U/L   Alkaline Phosphatase 87 38 - 126 U/L   Total Bilirubin 1.0 0.3 - 1.2 mg/dL   GFR, Estimated >60 >60 mL/min    Comment: (NOTE) Calculated using the CKD-EPI Creatinine Equation (2021)    Anion gap 11 5 - 15    Comment: Performed at [facility], [place] [address], [place], [place] [address]  Protime-INR     Status: None   Collection Time: 07/19/20 10:43 AM  Result Value Ref Range   Prothrombin Time 14.2 11.4 - 15.2 seconds   INR 1.1 0.8 - 1.2    Comment: (NOTE) INR goal varies based on device and disease states. Performed at [facility], [place] [address] [place], [place] [address]   Type and screen Order type and screen if day of surgery is less than 15 days from draw of preadmission visit or order morning of surgery if day of surgery is greater than 6 days from preadmission visit.     Status: None   Collection Time: 07/19/20 10:43 AM  Result Value Ref Range   ABO/RH(D) O POS    Antibody Screen NEG    Sample Expiration 08/02/2020,2359    Extend sample reason      NO TRANSFUSIONS OR PREGNANCY IN THE PAST 3 MONTHS Performed at [facility], [place] [address], [place], [place] [address]   Urinalysis, Routine w reflex microscopic Urine, Clean Catch     Status: Abnormal   Collection Time:  07/19/20 10:43 AM  Result Value Ref Range   Color, Urine STRAW (A) YELLOW   APPearance CLEAR CLEAR   Specific Gravity, Urine 1.005 1.005 - 1.030   pH 6.0 5.0 - 8.0   Glucose, UA NEGATIVE NEGATIVE mg/dL   Hgb urine dipstick NEGATIVE NEGATIVE   Bilirubin Urine NEGATIVE NEGATIVE  Ketones, ur NEGATIVE NEGATIVE mg/dL   Protein, ur NEGATIVE NEGATIVE mg/dL   Nitrite NEGATIVE NEGATIVE   Leukocytes,Ua LARGE (A) NEGATIVE   RBC / HPF 0-5 0 - 5 RBC/hpf   WBC, UA 11-20 0 - 5 WBC/hpf   Bacteria, UA RARE (A) NONE SEEN   Squamous Epithelial / LPF 0-5 0 - 5   Budding Yeast PRESENT    Hyaline Casts, UA PRESENT     Comment: Performed at [facility], [place] [address] [place], [place] [address]  Surgical pcr screen     Status: None   Collection Time: 07/19/20 10:43 AM   Specimen: Nasal Mucosa; Nasal Swab  Result Value Ref Range   MRSA, PCR NEGATIVE NEGATIVE   Staphylococcus aureus NEGATIVE NEGATIVE    Comment: (NOTE) The Xpert SA Assay (FDA approved for NASAL specimens in patients 67 years of age and older), is one component of a comprehensive surveillance program. It is not intended to diagnose infection nor to guide or monitor treatment. Performed at [facility], [place] [address], [place], [place] [address]   SARS CORONAVIRUS 2 (TAT 6-24 HRS) Nasopharyngeal Nasopharyngeal Swab     Status: None   Collection Time: 07/27/20 11:00 AM   Specimen: Nasopharyngeal Swab  Result Value Ref Range   SARS Coronavirus 2 NEGATIVE NEGATIVE    Comment: (NOTE) SARS-CoV-2 target nucleic acids are NOT DETECTED.  The SARS-CoV-2 RNA is generally detectable in upper and lower respiratory specimens during the acute phase of infection. Negative results do not preclude SARS-CoV-2 infection, do not rule out co-infections with other pathogens, and should not be used as the sole basis for treatment or other patient management decisions. Negative results must be combined with  clinical observations, patient history, and epidemiological information. The expected result is Negative.  Fact Sheet for Patients: SugarRoll.be  Fact Sheet for Healthcare Providers: https://www.woods-mathews.com/  This test is not yet approved or cleared by the [place] FDA and  has been authorized for detection and/or diagnosis of SARS-CoV-2 by FDA under an Emergency Use Authorization (EUA). This EUA will remain  in effect (meaning this test can be used) for the duration of the COVID-19 declaration under Se ction 564(b)(1) of the Act, 21 U.S.C. section 360bbb-3(b)(1), unless the authorization is terminated or revoked sooner.  Performed at [facility], [place] [address], [place], [place] [address]     Estimated body mass index is 40.14 kg/m as calculated from the following:   Height as of this encounter: '6\' 4"'  (1.93 m).   Weight as of this encounter: 149.6 kg.   Imaging Review Plain radiographs demonstrate severe degenerative joint disease of the left knee(s). The overall alignment ismild varus. The bone quality appears to be fair for age and reported activity level.      Assessment/Plan:  End stage arthritis, left knee   The patient history, physical examination, clinical judgment of the provider and imaging studies are consistent with end stage degenerative joint disease of the left knee(s) and total knee arthroplasty is deemed medically necessary. The treatment options including medical management, injection therapy arthroscopy and arthroplasty were discussed at length. The risks and benefits of total knee arthroplasty were presented and reviewed. The risks due to aseptic loosening, infection, stiffness, patella tracking problems, thromboembolic complications and other imponderables were discussed. The patient acknowledged the explanation, agreed to proceed with the plan and consent was signed. Patient is being admitted for  inpatient treatment for surgery, pain control, PT, OT, prophylactic antibiotics,  VTE prophylaxis, progressive ambulation and ADL's and discharge planning. The patient is planning to be discharged home with home health services     Patient's anticipated LOS is less than 2 midnights, meeting these requirements: - Younger than 69 - Lives within 1 hour of care - Has a competent adult at home to recover with post-op recover - NO history of  - Chronic pain requiring opiods  - Diabetes  - Coronary Artery Disease  - Heart failure  - Heart attack  - Stroke  - DVT/VTE  - Cardiac arrhythmia  - Respiratory Failure/COPD  - Renal failure  - Anemia  - Advanced Liver disease

## 2020-07-31 DIAGNOSIS — M1712 Unilateral primary osteoarthritis, left knee: Secondary | ICD-10-CM | POA: Diagnosis not present

## 2020-07-31 LAB — CBC
HCT: 28.2 % — ABNORMAL LOW (ref 39.0–52.0)
Hemoglobin: 9.4 g/dL — ABNORMAL LOW (ref 13.0–17.0)
MCH: 33.2 pg (ref 26.0–34.0)
MCHC: 33.3 g/dL (ref 30.0–36.0)
MCV: 99.6 fL (ref 80.0–100.0)
Platelets: 135 10*3/uL — ABNORMAL LOW (ref 150–400)
RBC: 2.83 MIL/uL — ABNORMAL LOW (ref 4.22–5.81)
RDW: 13.4 % (ref 11.5–15.5)
WBC: 8.2 10*3/uL (ref 4.0–10.5)
nRBC: 0 % (ref 0.0–0.2)

## 2020-07-31 MED ORDER — ASPIRIN EC 325 MG PO TBEC: 325.0000 mg | DELAYED_RELEASE_TABLET | Freq: Two times a day (BID) | ORAL | 0 refills | Status: AC

## 2020-07-31 MED ORDER — TIZANIDINE HCL 4 MG PO TABS: 4.0000 mg | ORAL_TABLET | Freq: Three times a day (TID) | ORAL | 0 refills | Status: DC | PRN

## 2020-07-31 MED ORDER — DOCUSATE SODIUM 100 MG PO CAPS: 100.0000 mg | ORAL_CAPSULE | Freq: Two times a day (BID) | ORAL | 0 refills | Status: AC | PRN

## 2020-07-31 MED ORDER — OXYCODONE HCL 5 MG PO TABS
5.0000 mg | ORAL_TABLET | Freq: Four times a day (QID) | ORAL | 0 refills | Status: DC | PRN
Start: 2020-07-31 — End: 2020-09-02

## 2020-07-31 MED ORDER — CELECOXIB 100 MG PO CAPS: 100.0000 mg | ORAL_CAPSULE | Freq: Two times a day (BID) | ORAL | 0 refills | Status: DC

## 2020-07-31 NOTE — TOC Initial Note (Signed)
Transition of Care [facility]) - Initial/Assessment Note    Patient Details  Name: [name] [name] MRN: [id number] Date of Birth: 13-Jul-1961  Transition of Care (TOC) CM/SW Contact:    Armanda Heritage, RN Phone Number: 07/31/2020, 9:25 AM  Clinical Narrative:    CM spoke with patient who reports he has rolling walker and 3in1.  [facility] to provide HHPT services.                 Expected Discharge Plan: Home w Home Health Services Barriers to Discharge: No Barriers Identified   Patient Goals and CMS Choice Patient states their goals for this hospitalization and ongoing recovery are:: to go home with therapy CMS Medicare.gov Compare Post Acute Care list provided to:: Patient Choice offered to / list presented to : Patient  Expected Discharge Plan and Services Expected Discharge Plan: Home w Home Health Services   Discharge Planning Services: CM Consult Post Acute Care Choice: Home Health Living arrangements for the past 2 months: Single Family Home Expected Discharge Date: 07/30/20               DME Arranged: N/A         HH Arranged: PT HH Agency: [facility] [employer] [facility]     Representative spoke with at [facility] Agency: pre-arranged in MD office  Prior Living Arrangements/Services Living arrangements for the past 2 months: Single Family Home   Patient language and need for interpreter reviewed:: Yes Do you feel safe going back to the place where you live?: Yes      Need for Family Participation in Patient Care: Yes (Comment) Care giver support system in place?: Yes (comment)   Criminal Activity/Legal Involvement Pertinent to Current Situation/Hospitalization: No - Comment as needed  Activities of Daily Living Home Assistive Devices/Equipment: CPAP, Walker (specify type) ADL Screening (condition at time of admission) Patient's cognitive ability adequate to safely complete daily activities?: Yes Is the patient deaf or have difficulty hearing?: No Does  the patient have difficulty seeing, even when wearing glasses/contacts?: No Does the patient have difficulty concentrating, remembering, or making decisions?: No Patient able to express need for assistance with ADLs?: Yes Does the patient have difficulty dressing or bathing?: No Independently performs ADLs?: Yes (appropriate for developmental age) Does the patient have difficulty walking or climbing stairs?: Yes Weakness of Legs: None Weakness of Arms/Hands: None  Permission Sought/Granted                  Emotional Assessment Appearance:: Appears stated age Attitude/Demeanor/Rapport: Engaged Affect (typically observed): Accepting Orientation: : Oriented to Self, Oriented to Place, Oriented to  Time, Oriented to Situation   Psych Involvement: No (comment)  Admission diagnosis:  Status post total knee replacement, left [Z96.652] Patient Active Problem List   Diagnosis Date Noted  . Primary osteoarthritis of left knee 07/30/2020  . Status post total left knee replacement 07/30/2020  . Status post total knee replacement, left 07/30/2020  . Ventricular tachycardia (HCC) 04/17/2020  . Primary osteoarthritis of right knee 01/30/2020  . Hypokalemia 11/23/2019  . Primary osteoarthritis of right hip 11/03/2018  . VT (ventricular tachycardia) (HCC) 05/30/2017  . AKI (acute kidney injury) (HCC)   . Obesity 12/25/2016  . OSA (obstructive sleep apnea) 12/25/2016  . Hyperglycemia 12/25/2016  . Acute respiratory failure with hypoxia (HCC) 12/25/2016  . Chronic anticoagulation 06/27/2016  . Encounter for therapeutic drug monitoring 04/19/2015  . S/P ICD (internal cardiac defibrillator) procedure Medtronic CRT ICD 04/15/2015  . Atrial  fibrillation with RVR - Paroxysmal 04/08/2015  . NICM 04/06/2015  . Troponin level elevated 04/06/2015  . Rectal bleeding 10/26/2011  . Hydrocele, left 05/24/2011  . Tobacco use disorder- 1/2 pk a day   . Alcohol abuse   . Obesity 09/27/2008  . CAD-  minor CAD 2009 and 2016 09/27/2008  . VENTRICULAR TACHYCARDIA 09/27/2008  . Essential hypertension 06/19/2008  . Chronic systolic heart failure (HCC) 06/19/2008   PCP:  Arvilla Market, DO Pharmacy:   [facility] [address], [place] - [address] [facility] [place] [address] Phone: [phone] Fax: [phone]  [facility] - [place], [place] - [address] AT Portal to [facility] [address] [place] [address] Phone: [phone] Fax: [phone]  [facility], [place] - [address] [place] [address] Phone: [phone] Fax: [phone]     Social Determinants of Health (SDOH) Interventions    Readmission Risk Interventions No flowsheet data found.

## 2020-07-31 NOTE — Progress Notes (Signed)
07/31/20 1100  PT Visit Information  Last PT Received On 07/31/20 Pt doing much better, pain controlled. incr amb distance. Reviewed TKA HEP. Pt is ready to d/c with family assist from PT standpoint  Assistance Needed +1  History of Present Illness Patient is 59 y.o. male s/p Lt TKA on 07/30/20 with PMH of V-tach, CHF, obesity, cardiomyopathy, HTN, hx of ETOH abuse, COPD, CAD, A-fib, OA, Rt THA,  Rt TKA on 01/30/20.  Subjective Data  Patient Stated Goal get back home and recover independence to play with his grandson  Precautions  Precautions Fall;Knee  Precaution Comments reviewed no pillow under knee (knee of bed raised)  Restrictions  Weight Bearing Restrictions No  LLE Weight Bearing WBAT  Pain Assessment  Pain Assessment 0-10  Pain Score 4  Pain Location left knee  Pain Descriptors / Indicators Grimacing;Guarding  Pain Intervention(s) Limited activity within patient's tolerance;Monitored during session;Premedicated before session;Ice applied  Cognition  Arousal/Alertness Awake/alert  Behavior During Therapy WFL for tasks assessed/performed  Overall Cognitive Status Within Functional Limits for tasks assessed  Bed Mobility  General bed mobility comments in chair   Transfers  Overall transfer level Needs assistance  Equipment used Rolling walker (2 wheeled)  Transfers Sit to/from Stand  Sit to Stand From elevated surface;Supervision  General transfer comment cues for safe hand placement and technique for power up, guarding for safety.   Ambulation/Gait  Ambulation/Gait assistance Min guard;Min assist  Gait Distance (Feet) 60 Feet  Assistive device Rolling walker (2 wheeled)  Gait Pattern/deviations Step-to pattern;Decreased stance time - left  General Gait Details cues for sequence, incr wt shift to LLE, no buckling, no LOB. slow but steady gait  Gait velocity decr  Total Joint Exercises  Ankle Circles/Pumps AROM;Both;10 reps  Quad Sets AROM;Both;10 reps  Heel Slides  AAROM;Left;10 reps  Straight Leg Raises AAROM;AROM;Left;10 reps  PT - End of Session  Equipment Utilized During Treatment Gait belt  Activity Tolerance Patient limited by pain  Patient left in chair;with call bell/phone within reach;with chair alarm set  Nurse Communication Mobility status   PT - Assessment/Plan  PT Plan Current plan remains appropriate  PT Visit Diagnosis Muscle weakness (generalized) (M62.81);Difficulty in walking, not elsewhere classified (R26.2)  PT Frequency (ACUTE ONLY) 7X/week  Follow Up Recommendations Follow surgeon's recommendation for DC plan and follow-up therapies;Outpatient PT  PT equipment None recommended by PT  AM-PAC PT "6 Clicks" Mobility Outcome Measure (Version 2)  Help needed turning from your back to your side while in a flat bed without using bedrails? 4  Help needed moving from lying on your back to sitting on the side of a flat bed without using bedrails? 3  Help needed moving to and from a bed to a chair (including a wheelchair)? 3  Help needed standing up from a chair using your arms (e.g., wheelchair or bedside chair)? 3  Help needed to walk in hospital room? 3  Help needed climbing 3-5 steps with a railing?  3  6 Click Score 19  Consider Recommendation of Discharge To: Home with [facility]  PT Goal Progression  Progress towards PT goals Progressing toward goals  Acute Rehab PT Goals  PT Goal Formulation With patient  Time For Goal Achievement 08/06/20  Potential to Achieve Goals Good  PT Time Calculation  PT Start Time (ACUTE ONLY) 1106  PT Stop Time (ACUTE ONLY) 1120  PT Time Calculation (min) (ACUTE ONLY) 14 min  PT General Charges  $$ ACUTE PT VISIT 1 Visit  PT Treatments  $Gait Training 8-22 mins

## 2020-07-31 NOTE — Discharge Summary (Signed)
Physician Discharge Summary  Patient ID: [name] [name] MRN: [id number] DOB/AGE: [birth year] 57 y.o.  Admit date: 07/30/2020 Discharge date: 07/31/2020  Admission Diagnoses:  Status post total left knee replacement  Discharge Diagnoses:  Principal Problem:   Status post total left knee replacement Active Problems:   Primary osteoarthritis of left knee   Status post total knee replacement, left   Past Medical History:  Diagnosis Date  . AICD (automatic cardioverter/defibrillator) present   . Arthritis   . Atrial fibrillation (HCC)   . CAD (coronary artery disease)    Mild nonobstructive (Cath 09)  . Chronic systolic heart failure (HCC)    Nonischemic CM: echo 4/12 with EF 25-30%, grade 2 diast dysfxn, mild dilated aortic root 43 mm, trivial MR, mod LAE  . COPD (chronic obstructive pulmonary disease) (HCC)   . Dyspnea    with exertion   . Ejection fraction < 50% 08/2019   20-25% noted on ECHO  . H/O ETOH abuse   . HTN (hypertension)   . Hypersomnia   . Nonischemic cardiomyopathy (HCC)   . Obesity, morbid (HCC)   . OSA on CPAP   . Systolic and diastolic CHF, acute on chronic (HCC) 11/2016  . Tobacco use disorder   . Ventricular tachycardia (HCC)     Surgeries: Procedure(s): TOTAL KNEE ARTHROPLASTY on 07/30/2020   Consultants (if any):   Discharged Condition: Improved  Hospital Course: [name] [name] is an 59 y.o. male who was admitted 07/30/2020 with a diagnosis of Status post total left knee replacement and went to the operating room on 07/30/2020 and underwent the above named procedures.    He was given perioperative antibiotics:  Anti-infectives (From admission, onward)   Start     Dose/Rate Route Frequency Ordered Stop   07/30/20 1600  ceFAZolin (ANCEF) IVPB 2g/100 mL premix        2 g 200 mL/hr over 30 Minutes Intravenous Every 6 hours 07/30/20 1514 07/30/20 2150   07/30/20 0600  ceFAZolin (ANCEF) 3 g in dextrose 5 % 50 mL IVPB        3 g 100 mL/hr over 30  Minutes Intravenous On call to O.R. 07/29/20 5621 07/30/20 0932    .  He was given sequential compression devices, early ambulation, and ASA for DVT prophylaxis.  He benefited maximally from the hospital stay and there were no complications.  On the day of discharge he was tol PO diet and meds, pain reasonably controlled, dressing c/d/i, intact LT sens P/D/1web with intact PF/DF ankle/toes, and cleared PT for d/c home.  Recent vital signs:  Vitals:   07/31/20 0550 07/31/20 1019  BP: 132/66 (!) 142/78  Pulse: 63 68  Resp: 14 18  Temp: 98 F (36.7 C)   SpO2: 96% 92%    Recent laboratory studies:  Lab Results  Component Value Date   HGB 9.4 (L) 07/31/2020   HGB 11.9 (L) 07/19/2020   HGB 11.7 (L) 04/17/2020   Lab Results  Component Value Date   WBC 8.2 07/31/2020   PLT 135 (L) 07/31/2020   Lab Results  Component Value Date   INR 1.1 07/19/2020   Lab Results  Component Value Date   NA 142 07/19/2020   K 4.7 07/19/2020   CL 100 07/19/2020   CO2 31 07/19/2020   BUN 15 07/19/2020   CREATININE 1.24 07/19/2020   GLUCOSE 137 (H) 07/19/2020    Discharge Medications:   Allergies as of 07/31/2020   No Known Allergies  Medication List    TAKE these medications   albuterol 108 (90 Base) MCG/ACT inhaler Commonly known as: VENTOLIN HFA Inhale 1 puff into the lungs every 6 (six) hours as needed for wheezing or shortness of breath.   amiodarone 200 MG tablet Commonly known as: PACERONE Take 1 tablet (200 mg total) by mouth daily. Starting 05-19-2020 What changed: additional instructions   aspirin EC 325 MG tablet Take 1 tablet (325 mg total) by mouth 2 (two) times daily after a meal.   atorvastatin 80 MG tablet Commonly known as: LIPITOR Take 1 tablet (80 mg total) by mouth every evening.   celecoxib 100 MG capsule Commonly known as: CeleBREX Take 1 capsule (100 mg total) by mouth 2 (two) times daily.   docusate sodium 100 MG capsule Commonly known as:  Colace Take 1 capsule (100 mg total) by mouth 2 (two) times daily as needed (for constipation).   Entresto 24-26 MG Generic drug: sacubitril-valsartan Take 1 tablet by mouth 2 (two) times daily. What changed: how much to take   oxyCODONE 5 MG immediate release tablet Commonly known as: Roxicodone Take 1 tablet (5 mg total) by mouth every 6 (six) hours as needed for severe pain.   potassium chloride SA 20 MEQ tablet Commonly known as: KLOR-CON Take 2 tablets (40 mEq total) by mouth 2 (two) times daily.   rivaroxaban 20 MG Tabs tablet Commonly known as: Xarelto Take 1 tablet (20 mg total) by mouth daily with supper.   sotalol 80 MG tablet Commonly known as: BETAPACE Take 1 tablet (80 mg total) by mouth every 12 (twelve) hours.   spironolactone 25 MG tablet Commonly known as: ALDACTONE Take 1 tablet (25 mg total) by mouth daily.   tiZANidine 4 MG tablet Commonly known as: Zanaflex Take 1 tablet (4 mg total) by mouth every 8 (eight) hours as needed for muscle spasms.   torsemide 20 MG tablet Commonly known as: DEMADEX Take 3 tablets (60 mg total) by mouth every morning AND 2 tablets (40 mg total) every evening. What changed: See the new instructions.   vitamin C 100 MG tablet Take 100 mg by mouth daily.            Durable Medical Equipment  (From admission, onward)         Start     Ordered   07/31/20 0940  DME 3-in-1  Once        07/31/20 0941   07/31/20 0940  DME Walker  Once       Question Answer Comment  Walker: With 5 Inch Wheels   Patient needs a walker to treat with the following condition Status post total knee replacement, left      07/31/20 0941   07/30/20 1514  DME Walker rolling  Once       Question:  Patient needs a walker to treat with the following condition  Answer:  Primary osteoarthritis of left knee   07/30/20 1513   07/30/20 1514  DME 3 n 1  Once        07/30/20 1513   07/30/20 1318  DME Walker rolling  Once       Question:  Patient  needs a walker to treat with the following condition  Answer:  Primary osteoarthritis of left knee   07/30/20 1317   07/30/20 1318  DME 3 n 1  Once        07/30/20 1317           Discharge  Care Instructions  (From admission, onward)         Start     Ordered   07/31/20 0000  Leave dressing on - Keep it clean, dry, and intact until clinic visit        07/31/20 0941          Diagnostic Studies: No results found.  Disposition: Discharge disposition: 01-Home or Self Care       Discharge Instructions    Call MD for:  difficulty breathing, headache or visual disturbances   Complete by: As directed    Call MD for:  persistant nausea and vomiting   Complete by: As directed    Call MD for:  severe uncontrolled pain   Complete by: As directed    Call MD for:  temperature >100.4   Complete by: As directed    Diet - low sodium heart healthy   Complete by: As directed    Discharge instructions   Complete by: As directed    INSTRUCTIONS AFTER JOINT REPLACEMENT   Remove items at home which could result in a fall. This includes throw rugs or furniture in walking pathways ICE to the affected joint every three hours while awake for 30 minutes at a time, for at least the first 3-5 days, and then as needed for pain and swelling.  Continue to use ice for pain and swelling. You may notice swelling that will progress down to the foot and ankle.  This is normal after surgery.  Elevate your leg when you are not up walking on it.   Continue to use the breathing machine you got in the hospital (incentive spirometer) which will help keep your temperature down.  It is common for your temperature to cycle up and down following surgery, especially at night when you are not up moving around and exerting yourself.  The breathing machine keeps your lungs expanded and your temperature down.   DIET:  As you were doing prior to hospitalization, we recommend a well-balanced diet.  DRESSING / WOUND  CARE / SHOWERING  You can remove the Ace bandage in 2 days.  You have a waterproof bandage under that.  Do not remove this.  Do not apply oils, creams, lotions or powders and are on the waterproof bandage.  You may shower with it but do not submerge it in water.  ACTIVITY  Increase activity slowly as tolerated, but follow the weight bearing instructions below.   No driving for 6 weeks or until further direction given by your physician.  You cannot drive while taking narcotics.  No lifting or carrying greater than 10 lbs. until further directed by your surgeon. Avoid periods of inactivity such as sitting longer than an hour when not asleep. This helps prevent blood clots.  You may return to work once you are authorized by your doctor.     WEIGHT BEARING   Weight bearing as tolerated with assist device (walker, cane, etc) as directed, use it as long as suggested by your surgeon or therapist, typically at least 4-6 weeks.   EXERCISES  Results after joint replacement surgery are often greatly improved when you follow the exercise, range of motion and muscle strengthening exercises prescribed by your doctor. Safety measures are also important to protect the joint from further injury. Any time any of these exercises cause you to have increased pain or swelling, decrease what you are doing until you are comfortable again and then slowly increase them. If you have problems or  questions, call your caregiver or physical therapist for advice.   Rehabilitation is important following a joint replacement. After just a few days of immobilization, the muscles of the leg can become weakened and shrink (atrophy).  These exercises are designed to build up the tone and strength of the thigh and leg muscles and to improve motion. Often times heat used for twenty to thirty minutes before working out will loosen up your tissues and help with improving the range of motion but do not use heat for the first two weeks  following surgery (sometimes heat can increase post-operative swelling).   These exercises can be done on a training (exercise) mat, on the floor, on a table or on a bed. Use whatever works the best and is most comfortable for you.    Use music or television while you are exercising so that the exercises are a pleasant break in your day. This will make your life better with the exercises acting as a break in your routine that you can look forward to.   Perform all exercises about fifteen times, three times per day or as directed.  You should exercise both the operative leg and the other leg as well.  Exercises include:   Quad Sets - Tighten up the muscle on the front of the thigh (Quad) and hold for 5-10 seconds.   Straight Leg Raises - With your knee straight (if you were given a brace, keep it on), lift the leg to 60 degrees, hold for 3 seconds, and slowly lower the leg.  Perform this exercise against resistance later as your leg gets stronger.  Leg Slides: Lying on your back, slowly slide your foot toward your buttocks, bending your knee up off the floor (only go as far as is comfortable). Then slowly slide your foot back down until your leg is flat on the floor again.  Angel Wings: Lying on your back spread your legs to the side as far apart as you can without causing discomfort.  Hamstring Strength:  Lying on your back, push your heel against the floor with your leg straight by tightening up the muscles of your buttocks.  Repeat, but this time bend your knee to a comfortable angle, and push your heel against the floor.  You may put a pillow under the heel to make it more comfortable if necessary.   A rehabilitation program following joint replacement surgery can speed recovery and prevent re-injury in the future due to weakened muscles. Contact your doctor or a physical therapist for more information on knee rehabilitation.    CONSTIPATION  Constipation is defined medically as fewer than three  stools per week and severe constipation as less than one stool per week.  Even if you have a regular bowel pattern at home, your normal regimen is likely to be disrupted due to multiple reasons following surgery.  Combination of anesthesia, postoperative narcotics, change in appetite and fluid intake all can affect your bowels.   YOU MUST use at least one of the following options; they are listed in order of increasing strength to get the job done.  They are all available over the counter, and you may need to use some, POSSIBLY even all of these options:    Drink plenty of fluids (prune juice may be helpful) and high fiber foods Colace 100 mg by mouth twice a day  Senokot for constipation as directed and as needed Dulcolax (bisacodyl), take with full glass of water  Miralax (polyethylene glycol)  once or twice a day as needed.  If you have tried all these things and are unable to have a bowel movement in the first 3-4 days after surgery call either your surgeon or your primary doctor.    If you experience loose stools or diarrhea, hold the medications until you stool forms back up.  If your symptoms do not get better within 1 week or if they get worse, check with your doctor.  If you experience "the worst abdominal pain ever" or develop nausea or vomiting, please contact the office immediately for further recommendations for treatment.   ITCHING:  If you experience itching with your medications, try taking only a single pain pill, or even half a pain pill at a time.  You can also use Benadryl over the counter for itching or also to help with sleep.   TED HOSE STOCKINGS:  Use stockings on both legs until for at least 2 weeks or as directed by physician office. They may be removed at night for sleeping.  MEDICATIONS:  See your medication summary on the "After Visit Summary" that nursing will review with you.  You may have some home medications which will be placed on hold until you complete the course  of blood thinner medication.  It is important for you to complete the blood thinner medication as prescribed.  PRECAUTIONS:  If you experience chest pain or shortness of breath - call 911 immediately for transfer to the hospital emergency department.   If you develop a fever greater that 101 F, purulent drainage from wound, increased redness or drainage from wound, foul odor from the wound/dressing, or calf pain - CONTACT YOUR SURGEON.                                                   FOLLOW-UP APPOINTMENTS:  If you do not already have a post-op appointment, please call the office for an appointment to be seen by your surgeon.  Guidelines for how soon to be seen are listed in your "After Visit Summary", but are typically between 1-4 weeks after surgery.  OTHER INSTRUCTIONS:   Knee Replacement:  Do not place pillow under knee, focus on keeping the knee straight while resting. CPM instructions: 0-90 degrees, 2 hours in the morning, 2 hours in the afternoon, and 2 hours in the evening. Place foam block, curve side up under heel at all times except when in CPM or when walking.  DO NOT modify, tear, cut, or change the foam block in any way.   DENTAL ANTIBIOTICS:  In most cases prophylactic antibiotics for Dental procdeures after total joint surgery are not necessary.  Exceptions are as follows:  1. History of prior total joint infection  2. Severely immunocompromised (Organ Transplant, cancer chemotherapy, Rheumatoid biologic meds such as Humera)  3. Poorly controlled diabetes (A1C &gt; 8.0, blood glucose over 200)  If you have one of these conditions, contact your surgeon for an antibiotic prescription, prior to your dental procedure.   MAKE SURE YOU:  Understand these instructions.  Get help right away if you are not doing well or get worse.    Thank you for letting us be a part of your medical care team.  It is a privilege we respect greatly.  We hope these instructions will help you  stay on track for a fast  and full recovery!   Increase activity slowly   Complete by: As directed    Leave dressing on - Keep it clean, dry, and intact until clinic visit   Complete by: As directed        Follow-up Information    [facility] Follow up.   Specialty: Home Health Services Why: agency will provide home health physical therapy Contact information: [address] [place] [address] [phone]        [name], MD Follow up on 08/16/2020.   Specialty: Orthopedic Surgery Why: Post-operative follow up scheduled for 08/16/2020 @ 10:15am Contact information: [address] [place] [address] [phone]                Signed: Jodi Marble 07/31/2020, 10:45 AM

## 2020-07-31 NOTE — Progress Notes (Signed)
Physical Therapy Treatment Patient Details Name: [name] [name] MRN: [id number] DOB: [birth year] Today's Date: 07/31/2020    History of Present Illness Patient is 59 y.o. male s/p Lt TKA on 07/30/20 with PMH of V-tach, CHF, obesity, cardiomyopathy, HTN, hx of ETOH abuse, COPD, CAD, A-fib, OA, Rt THA,  Rt TKA on 01/30/20.    PT Comments    Pt limited by pain this session. RN notified. Will see again later today.   Follow Up Recommendations  Follow surgeon's recommendation for DC plan and follow-up therapies;Outpatient PT     Equipment Recommendations  None recommended by PT    Recommendations for Other Services       Precautions / Restrictions Precautions Precautions: Fall;Knee Precaution Comments: reviewed no pillow under knee (knee of bed raised) Restrictions LLE Weight Bearing: Weight bearing as tolerated    Mobility  Bed Mobility Overal bed mobility: Needs Assistance Bed Mobility: Supine to Sit     Supine to sit: Min assist     General bed mobility comments: assist with LLE, incr times, cues for technique   Transfers Overall transfer level: Needs assistance Equipment used: Rolling walker (2 wheeled) Transfers: Sit to/from Stand Sit to Stand: Min guard;From elevated surface;Min assist         General transfer comment: cues for safe hand placement and technique for power up, guarding for safety.   Ambulation/Gait Ambulation/Gait assistance: Min guard;Min assist Gait Distance (Feet): 6 Feet Assistive device: Rolling walker (2 wheeled) Gait Pattern/deviations: Step-to pattern;Decreased stance time - left Gait velocity: decr   General Gait Details: cues for sequence, limited by pain, chair brought to pt    Stairs             Wheelchair Mobility    Modified Rankin (Stroke Patients Only)       Balance                                            Cognition Arousal/Alertness: Awake/alert Behavior During Therapy: WFL for tasks  assessed/performed Overall Cognitive Status: Within Functional Limits for tasks assessed                                        Exercises Total Joint Exercises Ankle Circles/Pumps: AROM;Both;5 reps Quad Sets: Limitations Quad Sets Limitations: pain    General Comments        Pertinent Vitals/Pain Pain Assessment: 0-10 Pain Score: 9  Pain Location: left knee Pain Descriptors / Indicators: Grimacing;Guarding Pain Intervention(s): Limited activity within patient's tolerance;Monitored during session;Ice applied;Repositioned;Patient requesting pain meds-RN notified    Home Living                      Prior Function            PT Goals (current goals can now be found in the care plan section) Acute Rehab PT Goals Patient Stated Goal: get back home and recover independence to play with his grandson PT Goal Formulation: With patient Time For Goal Achievement: 08/06/20 Potential to Achieve Goals: Good Progress towards PT goals: Progressing toward goals    Frequency    7X/week      PT Plan Current plan remains appropriate    Co-evaluation  AM-PAC PT "6 Clicks" Mobility   Outcome Measure  Help needed turning from your back to your side while in a flat bed without using bedrails?: None Help needed moving from lying on your back to sitting on the side of a flat bed without using bedrails?: A Little Help needed moving to and from a bed to a chair (including a wheelchair)?: A Little Help needed standing up from a chair using your arms (e.g., wheelchair or bedside chair)?: A Little Help needed to walk in hospital room?: A Little Help needed climbing 3-5 steps with a railing? : A Little 6 Click Score: 19    End of Session Equipment Utilized During Treatment: Gait belt Activity Tolerance: Patient limited by pain Patient left: in chair;with call bell/phone within reach;with chair alarm set Nurse Communication: Mobility status PT  Visit Diagnosis: Muscle weakness (generalized) (M62.81);Difficulty in walking, not elsewhere classified (R26.2)     Time: 0950-1009 PT Time Calculation (min) (ACUTE ONLY): 19 min  Charges:  $Gait Training: 8-22 mins                     Delice Bison, PT  Acute Rehab Dept ([facility]) [phone] Pager [phone]  07/31/2020    [facility] 07/31/2020, 10:15 AM

## 2020-08-02 NOTE — Anesthesia Postprocedure Evaluation (Signed)
Anesthesia Post Note  Patient: Kennieth Reish  Procedure(s) Performed: TOTAL KNEE ARTHROPLASTY (Left Knee)     Patient location during evaluation: PACU Anesthesia Type: Regional, MAC and Spinal Level of consciousness: awake and alert Pain management: pain level controlled Vital Signs Assessment: post-procedure vital signs reviewed and stable Respiratory status: spontaneous breathing, nonlabored ventilation, respiratory function stable and patient connected to nasal cannula oxygen Cardiovascular status: stable and blood pressure returned to baseline Postop Assessment: no apparent nausea or vomiting and spinal receding Anesthetic complications: no   No complications documented.  Last Vitals:  Vitals:   07/31/20 0550 07/31/20 1019  BP: 132/66 (!) 142/78  Pulse: 63 68  Resp: 14 18  Temp: 36.7 C   SpO2: 96% 92%    Last Pain:  Vitals:   07/31/20 1024  TempSrc:   PainSc: 8                  Ronnald Shedden

## 2020-08-03 ENCOUNTER — Encounter (HOSPITAL_COMMUNITY): Payer: Self-pay | Admitting: Orthopedic Surgery

## 2020-08-04 ENCOUNTER — Telehealth: Payer: Self-pay

## 2020-08-04 ENCOUNTER — Ambulatory Visit (INDEPENDENT_AMBULATORY_CARE_PROVIDER_SITE_OTHER): Payer: Medicare Other

## 2020-08-04 DIAGNOSIS — I5022 Chronic systolic (congestive) heart failure: Secondary | ICD-10-CM

## 2020-08-04 DIAGNOSIS — Z95810 Presence of automatic (implantable) cardiac defibrillator: Secondary | ICD-10-CM

## 2020-08-04 NOTE — Telephone Encounter (Signed)
[facility] transmission reviewed 08/04/20. 3 VT events recorded with 3 rounds of ATP successfully terminated. Longest episode in duration 26 seconds with average VR 185-200 bpm.  Patient reports of during the time of ATP he felt fine and was watching tv. Patient reports towards the afternoon he started to experience nausea but related it to eating something that did not agree with him. Denies any chest pain, palpitations, dizziness or shortness of breath. Patient had knee surgery last Friday and has done well. Reports of swelling in knee, denies any swelling to lower legs or abdomen.  Patient reports compliance to all medications including  Amiodarone 200 mg daily, xarelto 20 mg daily, Entresto 24-26 mg 0.5 tablets BID, sotalol 80 mg every 12 hours, torsemide 60 mg every morning and 40 mg every evening.   Shock plan/driving restrictions reviewed with patient. Advised patient to call with any questions or concerns.   Routing to Dr. Estill Dooms for review or recommendations. (Covering Dr. Graciela Husbands)

## 2020-08-06 ENCOUNTER — Other Ambulatory Visit: Payer: Self-pay

## 2020-08-06 MED ORDER — SOTALOL HCL 80 MG PO TABS
80.0000 mg | ORAL_TABLET | Freq: Two times a day (BID) | ORAL | 3 refills | Status: DC
Start: 2020-08-06 — End: 2020-09-23

## 2020-08-06 NOTE — Progress Notes (Signed)
EPIC Encounter for ICM Monitoring  Patient Name: [name] [name] is a 59 y.o. male Date: 08/06/2020 Primary Care Physican: Arvilla Market, DO Primary Cardiologist:[name] Electrophysiologist:[name] Bi-V Pacing:85.2% 10/6/2021Weight: 328lbs   Spoke with patient and reports doing well after sugery.  Decreased impedance correlates with total Knee surgery scheduled 07/30/2020. He did not feel pace terminated episodes when it happened and feels fine.  OptivolThoracic impedancesuggesting possible fluid accumulation starting 07/29/2020 and returning close to baseline normal on 08/04/2020.  Report shows pace terminated episodes x 3 which has already been addressed by device clinic.  Prescribed:  Torsemide20 mg 3 tablets (60 mg total)every morning and 2 tablets (40 mg total) every evening.  Potassium 2 tablets (40 mEq total) twice a day.  Spironolactone 25 mg take 1 tablet daily.  Labs: 07/19/2020 Creatinine 1.24, BUN 15, Potassium 4.7, Sodium 142 05/10/2020 Creatinine 1.27, BUN 12, Potassium 4.1, Sodium 136, GFR >60  05/05/2020 Creatinine 1.35, BUN 20, Potassium 4.7, Sodium 139, GFR 58-67  04/17/2020 Creatinine 1.41, BUN 15, Potassium 4.0, Sodium 137, GFR 55->60  A complete set of results can be found in Results Review.  Recommendations: No changes and encouraged to call if experiencing any fluid symptoms.  Follow-up plan: ICM clinic phone appointment on12/14/2021. 91 day device clinic remote transmission1/08/2021.   EP/Cardiology Office Visits:09/02/2020 withDr Shirlee Latch.   Copy of ICM check sent to Dr.[name].   3 month ICM trend: 08/04/2020    1 Year ICM trend:       [name], RN 08/06/2020 1:46 PM

## 2020-08-06 NOTE — Patient Instructions (Signed)
4

## 2020-08-11 ENCOUNTER — Other Ambulatory Visit (HOSPITAL_COMMUNITY): Payer: Self-pay | Admitting: *Deleted

## 2020-08-11 ENCOUNTER — Other Ambulatory Visit (HOSPITAL_COMMUNITY): Payer: Self-pay

## 2020-08-11 MED ORDER — RIVAROXABAN 20 MG PO TABS
20.0000 mg | ORAL_TABLET | Freq: Every day | ORAL | 3 refills | Status: DC
Start: 2020-08-11 — End: 2020-09-23

## 2020-08-11 MED ORDER — ATORVASTATIN CALCIUM 80 MG PO TABS
80.0000 mg | ORAL_TABLET | Freq: Every evening | ORAL | 3 refills | Status: DC
Start: 2020-08-11 — End: 2020-09-23

## 2020-08-11 NOTE — Progress Notes (Signed)
Paramedicine Encounter    Patient ID: [name], male    DOB: [birth year], 59 y.o.   MRN: [id number]   Patient Care Team: Arvilla Market, DO as PCP - General (Family Medicine) [name], [name], MD as PCP - Advanced Heart Failure (Cardiology) [name], MD as PCP - Electrophysiology (Cardiology) [name], MD as PCP - Cardiology (Cardiology) [name], MD (Family Medicine) [name], LCSW as Social Worker (Licensed Clinical Social Worker)  Patient Active Problem List   Diagnosis Date Noted  . Primary osteoarthritis of left knee 07/30/2020  . Status post total left knee replacement 07/30/2020  . Status post total knee replacement, left 07/30/2020  . Ventricular tachycardia (HCC) 04/17/2020  . Primary osteoarthritis of right knee 01/30/2020  . Hypokalemia 11/23/2019  . Primary osteoarthritis of right hip 11/03/2018  . VT (ventricular tachycardia) (HCC) 05/30/2017  . AKI (acute kidney injury) (HCC)   . Obesity 12/25/2016  . OSA (obstructive sleep apnea) 12/25/2016  . Hyperglycemia 12/25/2016  . Acute respiratory failure with hypoxia (HCC) 12/25/2016  . Chronic anticoagulation 06/27/2016  . Encounter for therapeutic drug monitoring 04/19/2015  . S/P ICD (internal cardiac defibrillator) procedure Medtronic CRT ICD 04/15/2015  . Atrial fibrillation with RVR - Paroxysmal 04/08/2015  . NICM 04/06/2015  . Troponin level elevated 04/06/2015  . Rectal bleeding 10/26/2011  . Hydrocele, left 05/24/2011  . Tobacco use disorder- 1/2 pk a day   . Alcohol abuse   . Obesity 09/27/2008  . CAD- minor CAD 2009 and 2016 09/27/2008  . VENTRICULAR TACHYCARDIA 09/27/2008  . Essential hypertension 06/19/2008  . Chronic systolic heart failure (HCC) 06/19/2008    Current Outpatient Medications:  .  albuterol (VENTOLIN HFA) 108 (90 Base) MCG/ACT inhaler, Inhale 1 puff into the lungs every 6 (six) hours as needed for wheezing or shortness of breath., Disp: 18  g, Rfl: 3 .  amiodarone (PACERONE) 200 MG tablet, Take 1 tablet (200 mg total) by mouth daily. Starting 05-19-2020 (Patient taking differently: Take 200 mg by mouth daily. ), Disp: 60 tablet, Rfl: 3 .  Ascorbic Acid (VITAMIN C) 100 MG tablet, Take 100 mg by mouth daily., Disp: , Rfl:  .  aspirin EC 325 MG tablet, Take 1 tablet (325 mg total) by mouth 2 (two) times daily after a meal., Disp: 60 tablet, Rfl: 0 .  atorvastatin (LIPITOR) 80 MG tablet, Take 1 tablet (80 mg total) by mouth every evening., Disp: 180 tablet, Rfl: 3 .  celecoxib (CELEBREX) 100 MG capsule, Take 1 capsule (100 mg total) by mouth 2 (two) times daily., Disp: 60 capsule, Rfl: 0 .  docusate sodium (COLACE) 100 MG capsule, Take 1 capsule (100 mg total) by mouth 2 (two) times daily as needed (for constipation)., Disp: 60 capsule, Rfl: 0 .  oxyCODONE (ROXICODONE) 5 MG immediate release tablet, Take 1 tablet (5 mg total) by mouth every 6 (six) hours as needed for severe pain., Disp: 28 tablet, Rfl: 0 .  potassium chloride SA (KLOR-CON) 20 MEQ tablet, Take 2 tablets (40 mEq total) by mouth 2 (two) times daily., Disp: 240 tablet, Rfl: 3 .  rivaroxaban (XARELTO) 20 MG TABS tablet, Take 1 tablet (20 mg total) by mouth daily with supper., Disp: 30 tablet, Rfl: 6 .  sacubitril-valsartan (ENTRESTO) 24-26 MG, Take 1 tablet by mouth 2 (two) times daily. (Patient taking differently: Take 0.5 tablets by mouth 2 (two) times daily. ), Disp: 60 tablet, Rfl: 3 .  sotalol (BETAPACE) 80 MG tablet, Take  1 tablet (80 mg total) by mouth every 12 (twelve) hours., Disp: 180 tablet, Rfl: 3 .  spironolactone (ALDACTONE) 25 MG tablet, Take 1 tablet (25 mg total) by mouth daily., Disp: 90 tablet, Rfl: 3 .  tiZANidine (ZANAFLEX) 4 MG tablet, Take 1 tablet (4 mg total) by mouth every 8 (eight) hours as needed for muscle spasms., Disp: 60 tablet, Rfl: 0 .  torsemide (DEMADEX) 20 MG tablet, Take 3 tablets (60 mg total) by mouth every morning AND 2 tablets (40 mg total)  every evening. (Patient taking differently: Take 60 mg by mouth every morning and 40 mg every evening.), Disp: 300 tablet, Rfl: 3 No Known Allergies    Social History   Socioeconomic History  . Marital status: Single    Spouse name: Not on file  . Number of children: 2  . Years of education: Not on file  . Highest education level: Not on file  Occupational History  . Occupation: Copy  . Occupation: disabled, odd jobs  Tobacco Use  . Smoking status: Former Smoker    Packs/day: 0.50    Years: 4.00    Pack years: 2.00    Types: Cigarettes    Quit date: 10/29/2005    Years since quitting: 14.7  . Smokeless tobacco: Never Used  . Tobacco comment: about a pack a week. social  Vaping Use  . Vaping Use: Never used  Substance and Sexual Activity  . Alcohol use: Yes    Comment: 12 beers per week   . Drug use: Not Currently    Types: Marijuana  . Sexual activity: Yes    Partners: Female  Other Topics Concern  . Not on file  Social History Narrative   ** Merged History Encounter **       Social Determinants of Health   Financial Resource Strain:   . Difficulty of Paying Living Expenses: Not on file  Food Insecurity:   . Worried About Programme researcher, broadcasting/film/video in the Last Year: Not on file  . Ran Out of Food in the Last Year: Not on file  Transportation Needs: No Transportation Needs  . Lack of Transportation (Medical): No  . Lack of Transportation (Non-Medical): No  Physical Activity: Sufficiently Active  . Days of Exercise per Week: 7 days  . Minutes of Exercise per Session: 30 min  Stress:   . Feeling of Stress : Not on file  Social Connections:   . Frequency of Communication with Friends and Family: Not on file  . Frequency of Social Gatherings with Friends and Family: Not on file  . Attends Religious Services: Not on file  . Active Member of Clubs or Organizations: Not on file  . Attends Banker Meetings: Not on file  . Marital Status: Not on file   Intimate Partner Violence:   . Fear of Current or Ex-Partner: Not on file  . Emotionally Abused: Not on file  . Physically Abused: Not on file  . Sexually Abused: Not on file    Physical Exam Cardiovascular:     Rate and Rhythm: Normal rate and regular rhythm.     Pulses: Normal pulses.  Pulmonary:     Effort: Pulmonary effort is normal.     Breath sounds: Normal breath sounds.  Musculoskeletal:        General: Normal range of motion.     Right lower leg: No edema.     Left lower leg: No edema.  Skin:    General: Skin  is warm and dry.     Capillary Refill: Capillary refill takes less than 2 seconds.  Neurological:     Mental Status: He is alert and oriented to person, place, and time.  Psychiatric:        Mood and Affect: Mood normal.         Future Appointments  Date Time Provider Department Center  08/31/2020  8:35 AM [facility] DEVICE REMOTES [facility]  09/02/2020 11:00 AM [facility] E[facility] OP 1 [facility]  09/02/2020 12:00 PM [name], MD [facility] None  09/29/2020  8:25 AM [facility] DEVICE REMOTES [facility]  12/29/2020  8:25 AM [facility] DEVICE REMOTES [facility]    BP 134/81 (BP Location: Left Arm, Patient Position: Sitting, Cuff Size: Normal)   Pulse 73   Resp 16   Wt (!) 333 lb (151 kg)   SpO2 99%   BMI 40.53 kg/m   Weight yesterday- 331 lb Last visit weight- 329 lb  Mr Brunton was seen at home today and reported feeling well. He denied chest pain, SOB, headache, dizziness, orthopnea, fever or cough over the past 2 weeks. He stated he has been compliant with his medications and his weight has been generally stable. His medications were verified and his pillbox was refilled. I will follow up in two weeks.   Jacqualine Code, EMT 08/11/20  ACTION: Home visit completed Next visit planned for 2 weeks

## 2020-08-25 ENCOUNTER — Telehealth (HOSPITAL_COMMUNITY): Payer: Self-pay

## 2020-08-25 NOTE — Telephone Encounter (Signed)
I called Mr Kinkead to schedule an appointment. He stated he would be available tomorrow morning before his PT appointment so I advised I would come at 08:30. He was understanding and agreeable.   Jacqualine Code, EMT 08/25/20

## 2020-08-26 ENCOUNTER — Ambulatory Visit (INDEPENDENT_AMBULATORY_CARE_PROVIDER_SITE_OTHER): Payer: Medicare Other

## 2020-08-26 ENCOUNTER — Other Ambulatory Visit (HOSPITAL_COMMUNITY): Payer: Self-pay

## 2020-08-26 DIAGNOSIS — I428 Other cardiomyopathies: Secondary | ICD-10-CM

## 2020-08-26 LAB — CUP PACEART REMOTE DEVICE CHECK
Battery Remaining Longevity: 13 mo
Battery Voltage: 2.89 V
Brady Statistic AP VP Percent: 0.65 %
Brady Statistic AP VS Percent: 5.96 %
Brady Statistic AS VP Percent: 91.41 %
Brady Statistic AS VS Percent: 1.99 %
Brady Statistic RA Percent Paced: 6.55 %
Brady Statistic RV Percent Paced: 91.63 %
Date Time Interrogation Session: 20211209154123
HighPow Impedance: 74 Ohm
Implantable Lead Implant Date: 20160727
Implantable Lead Implant Date: 20160727
Implantable Lead Implant Date: 20160727
Implantable Lead Location: 753858
Implantable Lead Location: 753859
Implantable Lead Location: 753860
Implantable Lead Model: 4598
Implantable Lead Model: 5076
Implantable Lead Model: 6935
Implantable Pulse Generator Implant Date: 20160727
Lead Channel Impedance Value: 361 Ohm
Lead Channel Impedance Value: 361 Ohm
Lead Channel Impedance Value: 399 Ohm
Lead Channel Impedance Value: 418 Ohm
Lead Channel Impedance Value: 418 Ohm
Lead Channel Impedance Value: 456 Ohm
Lead Channel Impedance Value: 456 Ohm
Lead Channel Impedance Value: 475 Ohm
Lead Channel Impedance Value: 608 Ohm
Lead Channel Impedance Value: 646 Ohm
Lead Channel Impedance Value: 665 Ohm
Lead Channel Impedance Value: 703 Ohm
Lead Channel Impedance Value: 722 Ohm
Lead Channel Pacing Threshold Amplitude: 0.5 V
Lead Channel Pacing Threshold Amplitude: 0.625 V
Lead Channel Pacing Threshold Amplitude: 1.625 V
Lead Channel Pacing Threshold Pulse Width: 0.4 ms
Lead Channel Pacing Threshold Pulse Width: 0.4 ms
Lead Channel Pacing Threshold Pulse Width: 0.6 ms
Lead Channel Sensing Intrinsic Amplitude: 1.875 mV
Lead Channel Sensing Intrinsic Amplitude: 1.875 mV
Lead Channel Sensing Intrinsic Amplitude: 10.25 mV
Lead Channel Sensing Intrinsic Amplitude: 10.25 mV
Lead Channel Setting Pacing Amplitude: 0.5 V
Lead Channel Setting Pacing Amplitude: 1.5 V
Lead Channel Setting Pacing Amplitude: 3 V
Lead Channel Setting Pacing Pulse Width: 0.03 ms
Lead Channel Setting Pacing Pulse Width: 0.6 ms
Lead Channel Setting Sensing Sensitivity: 0.3 mV

## 2020-08-26 NOTE — Progress Notes (Signed)
Paramedicine Encounter    Patient ID: [name] [name], male    DOB: [birth year], 59 y.o.   MRN: [id number]   Patient Care Team: Arvilla Market, DO as PCP - General (Family Medicine) [name] [name], [name], MD as PCP - Advanced Heart Failure (Cardiology) [name], MD as PCP - Electrophysiology (Cardiology) [name], MD as PCP - Cardiology (Cardiology) [name], MD (Family Medicine) [name], LCSW as Social Worker (Licensed Clinical Social Worker)  Patient Active Problem List   Diagnosis Date Noted  . Primary osteoarthritis of left knee 07/30/2020  . Status post total left knee replacement 07/30/2020  . Status post total knee replacement, left 07/30/2020  . Ventricular tachycardia (HCC) 04/17/2020  . Primary osteoarthritis of right knee 01/30/2020  . Hypokalemia 11/23/2019  . Primary osteoarthritis of right hip 11/03/2018  . VT (ventricular tachycardia) (HCC) 05/30/2017  . AKI (acute kidney injury) (HCC)   . Obesity 12/25/2016  . OSA (obstructive sleep apnea) 12/25/2016  . Hyperglycemia 12/25/2016  . Acute respiratory failure with hypoxia (HCC) 12/25/2016  . Chronic anticoagulation 06/27/2016  . Encounter for therapeutic drug monitoring 04/19/2015  . S/P ICD (internal cardiac defibrillator) procedure Medtronic CRT ICD 04/15/2015  . Atrial fibrillation with RVR - Paroxysmal 04/08/2015  . NICM 04/06/2015  . Troponin level elevated 04/06/2015  . Rectal bleeding 10/26/2011  . Hydrocele, left 05/24/2011  . Tobacco use disorder- 1/2 pk a day   . Alcohol abuse   . Obesity 09/27/2008  . CAD- minor CAD 2009 and 2016 09/27/2008  . VENTRICULAR TACHYCARDIA 09/27/2008  . Essential hypertension 06/19/2008  . Chronic systolic heart failure (HCC) 06/19/2008    Current Outpatient Medications:  .  albuterol (VENTOLIN HFA) 108 (90 Base) MCG/ACT inhaler, Inhale 1 puff into the lungs every 6 (six) hours as needed for wheezing or shortness of breath., Disp: 18  g, Rfl: 3 .  amiodarone (PACERONE) 200 MG tablet, Take 1 tablet (200 mg total) by mouth daily. Starting 05-19-2020 (Patient taking differently: Take 200 mg by mouth daily. ), Disp: 60 tablet, Rfl: 3 .  Ascorbic Acid (VITAMIN C) 100 MG tablet, Take 100 mg by mouth daily., Disp: , Rfl:  .  aspirin EC 325 MG tablet, Take 1 tablet (325 mg total) by mouth 2 (two) times daily after a meal., Disp: 60 tablet, Rfl: 0 .  atorvastatin (LIPITOR) 80 MG tablet, Take 1 tablet (80 mg total) by mouth every evening., Disp: 90 tablet, Rfl: 3 .  celecoxib (CELEBREX) 100 MG capsule, Take 1 capsule (100 mg total) by mouth 2 (two) times daily., Disp: 60 capsule, Rfl: 0 .  docusate sodium (COLACE) 100 MG capsule, Take 1 capsule (100 mg total) by mouth 2 (two) times daily as needed (for constipation)., Disp: 60 capsule, Rfl: 0 .  oxyCODONE (ROXICODONE) 5 MG immediate release tablet, Take 1 tablet (5 mg total) by mouth every 6 (six) hours as needed for severe pain., Disp: 28 tablet, Rfl: 0 .  potassium chloride SA (KLOR-CON) 20 MEQ tablet, Take 2 tablets (40 mEq total) by mouth 2 (two) times daily., Disp: 240 tablet, Rfl: 3 .  rivaroxaban (XARELTO) 20 MG TABS tablet, Take 1 tablet (20 mg total) by mouth daily with supper., Disp: 90 tablet, Rfl: 3 .  sacubitril-valsartan (ENTRESTO) 24-26 MG, Take 1 tablet by mouth 2 (two) times daily. (Patient taking differently: Take 0.5 tablets by mouth 2 (two) times daily. ), Disp: 60 tablet, Rfl: 3 .  sotalol (BETAPACE) 80 MG tablet, Take  1 tablet (80 mg total) by mouth every 12 (twelve) hours., Disp: 180 tablet, Rfl: 3 .  spironolactone (ALDACTONE) 25 MG tablet, Take 1 tablet (25 mg total) by mouth daily., Disp: 90 tablet, Rfl: 3 .  tiZANidine (ZANAFLEX) 4 MG tablet, Take 1 tablet (4 mg total) by mouth every 8 (eight) hours as needed for muscle spasms., Disp: 60 tablet, Rfl: 0 .  torsemide (DEMADEX) 20 MG tablet, Take 3 tablets (60 mg total) by mouth every morning AND 2 tablets (40 mg total)  every evening. (Patient taking differently: Take 60 mg by mouth every morning and 40 mg every evening.), Disp: 300 tablet, Rfl: 3 No Known Allergies    Social History   Socioeconomic History  . Marital status: Single    Spouse name: Not on file  . Number of children: 2  . Years of education: Not on file  . Highest education level: Not on file  Occupational History  . Occupation: Copy  . Occupation: disabled, odd jobs  Tobacco Use  . Smoking status: Former Smoker    Packs/day: 0.50    Years: 4.00    Pack years: 2.00    Types: Cigarettes    Quit date: 10/29/2005    Years since quitting: 14.8  . Smokeless tobacco: Never Used  . Tobacco comment: about a pack a week. social  Vaping Use  . Vaping Use: Never used  Substance and Sexual Activity  . Alcohol use: Yes    Comment: 12 beers per week   . Drug use: Not Currently    Types: Marijuana  . Sexual activity: Yes    Partners: Female  Other Topics Concern  . Not on file  Social History Narrative   ** Merged History Encounter **       Social Determinants of Health   Financial Resource Strain: Not on file  Food Insecurity: Not on file  Transportation Needs: No Transportation Needs  . Lack of Transportation (Medical): No  . Lack of Transportation (Non-Medical): No  Physical Activity: Sufficiently Active  . Days of Exercise per Week: 7 days  . Minutes of Exercise per Session: 30 min  Stress: Not on file  Social Connections: Not on file  Intimate Partner Violence: Not on file    Physical Exam Cardiovascular:     Rate and Rhythm: Normal rate and regular rhythm.     Pulses: Normal pulses.  Pulmonary:     Effort: Pulmonary effort is normal.     Breath sounds: Normal breath sounds.  Musculoskeletal:        General: Normal range of motion.     Right lower leg: No edema.     Left lower leg: No edema.  Skin:    General: Skin is warm and dry.     Capillary Refill: Capillary refill takes less than 2 seconds.   Neurological:     Mental Status: He is alert and oriented to person, place, and time.  Psychiatric:        Mood and Affect: Mood normal.         Future Appointments  Date Time Provider Department Center  08/31/2020  8:35 AM [facility] DEVICE REMOTES [facility]  09/02/2020 11:00 AM [facility] OP 1 [facility]  09/02/2020 12:00 PM [name], MD [facility] None  09/29/2020  8:25 AM [facility] DEVICE REMOTES [facility]  12/29/2020  8:25 AM [facility] DEVICE REMOTES [facility]    BP 124/68 (BP Location: Left Arm, Patient Position: Sitting, Cuff Size:  Large)   Pulse 74   Resp 16   Wt (!) 328 lb (148.8 kg)   SpO2 98%   BMI 39.93 kg/m   Weight yesterday- 328 lb Last visit weight- 333 lb  Mr  Plocher was seen at home today and reported feeling well. He denied chest pain, SOB, headache, dizziness, orthopnea, fever or cough over the past week. He stated he has been compliant with his medications over the past week and his weight has been stable. His medications were verified and his pillbox was checked for accuracy. He will be having an insurance change starting the first of the year so I have asked that he speak to his representative to determine if he will need patient assistance for Xarelto and Entresto next week. I have also ordered refills of both medication to help bridge the new year. I will follow up in two weeks.   Jacqualine Code, EMT 08/26/20  ACTION: Home visit completed Next visit planned for 2 weeks

## 2020-08-27 ENCOUNTER — Other Ambulatory Visit (HOSPITAL_COMMUNITY): Payer: Self-pay | Admitting: *Deleted

## 2020-08-31 ENCOUNTER — Ambulatory Visit (INDEPENDENT_AMBULATORY_CARE_PROVIDER_SITE_OTHER): Payer: Medicare Other

## 2020-08-31 DIAGNOSIS — I5022 Chronic systolic (congestive) heart failure: Secondary | ICD-10-CM | POA: Diagnosis not present

## 2020-08-31 DIAGNOSIS — Z95810 Presence of automatic (implantable) cardiac defibrillator: Secondary | ICD-10-CM

## 2020-09-02 ENCOUNTER — Ambulatory Visit (HOSPITAL_BASED_OUTPATIENT_CLINIC_OR_DEPARTMENT_OTHER)
Admission: RE | Admit: 2020-09-02 | Discharge: 2020-09-02 | Disposition: A | Discharge status: Home / Self Care | Payer: Medicare Other | Source: Ambulatory Visit | Attending: Cardiology | Admitting: Cardiology

## 2020-09-02 ENCOUNTER — Ambulatory Visit (HOSPITAL_COMMUNITY)
Admission: RE | Admit: 2020-09-02 | Discharge: 2020-09-02 | Disposition: A | Discharge status: Home / Self Care | Payer: Medicare Other | Source: Ambulatory Visit | Attending: Cardiology | Admitting: Cardiology

## 2020-09-02 ENCOUNTER — Encounter (HOSPITAL_COMMUNITY): Payer: Self-pay | Admitting: Cardiology

## 2020-09-02 ENCOUNTER — Other Ambulatory Visit: Payer: Self-pay

## 2020-09-02 VITALS — BP 120/78 | HR 69 | Wt 329.8 lb

## 2020-09-02 DIAGNOSIS — Z79899 Other long term (current) drug therapy: Secondary | ICD-10-CM | POA: Insufficient documentation

## 2020-09-02 DIAGNOSIS — I480 Paroxysmal atrial fibrillation: Secondary | ICD-10-CM | POA: Insufficient documentation

## 2020-09-02 DIAGNOSIS — Z7901 Long term (current) use of anticoagulants: Secondary | ICD-10-CM | POA: Insufficient documentation

## 2020-09-02 DIAGNOSIS — Z8249 Family history of ischemic heart disease and other diseases of the circulatory system: Secondary | ICD-10-CM | POA: Insufficient documentation

## 2020-09-02 DIAGNOSIS — I454 Nonspecific intraventricular block: Secondary | ICD-10-CM | POA: Diagnosis not present

## 2020-09-02 DIAGNOSIS — Z95810 Presence of automatic (implantable) cardiac defibrillator: Secondary | ICD-10-CM | POA: Diagnosis not present

## 2020-09-02 DIAGNOSIS — I110 Hypertensive heart disease with heart failure: Secondary | ICD-10-CM | POA: Diagnosis not present

## 2020-09-02 DIAGNOSIS — I5022 Chronic systolic (congestive) heart failure: Secondary | ICD-10-CM

## 2020-09-02 DIAGNOSIS — F1010 Alcohol abuse, uncomplicated: Secondary | ICD-10-CM | POA: Diagnosis not present

## 2020-09-02 DIAGNOSIS — J449 Chronic obstructive pulmonary disease, unspecified: Secondary | ICD-10-CM | POA: Insufficient documentation

## 2020-09-02 DIAGNOSIS — G4733 Obstructive sleep apnea (adult) (pediatric): Secondary | ICD-10-CM | POA: Diagnosis not present

## 2020-09-02 DIAGNOSIS — Z87891 Personal history of nicotine dependence: Secondary | ICD-10-CM | POA: Insufficient documentation

## 2020-09-02 DIAGNOSIS — I428 Other cardiomyopathies: Secondary | ICD-10-CM | POA: Insufficient documentation

## 2020-09-02 DIAGNOSIS — I440 Atrioventricular block, first degree: Secondary | ICD-10-CM | POA: Insufficient documentation

## 2020-09-02 LAB — COMPREHENSIVE METABOLIC PANEL
ALT: 16 U/L (ref 0–44)
AST: 14 U/L — ABNORMAL LOW (ref 15–41)
Albumin: 3.9 g/dL (ref 3.5–5.0)
Alkaline Phosphatase: 104 U/L (ref 38–126)
Anion gap: 12 (ref 5–15)
BUN: 12 mg/dL (ref 6–20)
CO2: 26 mmol/L (ref 22–32)
Calcium: 9.4 mg/dL (ref 8.9–10.3)
Chloride: 101 mmol/L (ref 98–111)
Creatinine, Ser: 1.03 mg/dL (ref 0.61–1.24)
GFR, Estimated: >60 mL/min (ref >60)
Glucose, Bld: 120 mg/dL — ABNORMAL HIGH (ref 70–99)
Potassium: 3.9 mmol/L (ref 3.5–5.1)
Sodium: 139 mmol/L (ref 135–145)
Total Bilirubin: 0.9 mg/dL (ref 0.3–1.2)
Total Protein: 6.8 g/dL (ref 6.5–8.1)

## 2020-09-02 LAB — CBC
HCT: 35.6 % — ABNORMAL LOW (ref 39.0–52.0)
Hemoglobin: 11.9 g/dL — ABNORMAL LOW (ref 13.0–17.0)
MCH: 32.3 pg (ref 26.0–34.0)
MCHC: 33.4 g/dL (ref 30.0–36.0)
MCV: 96.7 fL (ref 80.0–100.0)
Platelets: 165 10*3/uL (ref 150–400)
RBC: 3.68 MIL/uL — ABNORMAL LOW (ref 4.22–5.81)
RDW: 13.7 % (ref 11.5–15.5)
WBC: 5.4 10*3/uL (ref 4.0–10.5)
nRBC: 0 % (ref 0.0–0.2)

## 2020-09-02 LAB — ECHOCARDIOGRAM COMPLETE
Area-P 1/2: 5.02 cm2
S' Lateral: 4.7 cm

## 2020-09-02 LAB — TSH: TSH: 0.532 u[IU]/mL (ref 0.350–4.500)

## 2020-09-02 MED ORDER — ENTRESTO 49-51 MG PO TABS
1.0000 | ORAL_TABLET | Freq: Two times a day (BID) | ORAL | 3 refills | Status: DC
Start: 2020-09-02 — End: 2020-09-02

## 2020-09-02 MED ORDER — ENTRESTO 49-51 MG PO TABS: 1.0000 | ORAL_TABLET | Freq: Two times a day (BID) | ORAL | 3 refills | Status: DC

## 2020-09-02 MED ORDER — ENTRESTO 49-51 MG PO TABS
1.0000 | ORAL_TABLET | Freq: Two times a day (BID) | ORAL | 3 refills | Status: DC
Start: 2020-09-02 — End: 2020-09-23

## 2020-09-02 NOTE — Progress Notes (Signed)
  Echocardiogram 2D Echocardiogram has been performed.  [name] [name] 09/02/2020, 11:42 AM

## 2020-09-02 NOTE — Patient Instructions (Signed)
Increase Entresto to 49/51 mg Twice daily   Labs done today, your results will be available in MyChart, we will contact you for abnormal readings.  Your physician recommends that you return for lab work in: 1-2 weeks  Please follow up with our heart failure pharmacist in 3-4 weeks  Your physician recommends that you schedule a follow-up appointment in: 6 weeks  If you have any questions or concerns before your next appointment please send [place] a message through [place] or call our office at [phone].    TO LEAVE A MESSAGE FOR THE NURSE SELECT OPTION 2, PLEASE LEAVE A MESSAGE INCLUDING:  YOUR NAME  DATE OF BIRTH  CALL BACK NUMBER  REASON FOR CALL**this is important as we prioritize the call backs  YOU WILL RECEIVE A CALL BACK THE SAME DAY AS LONG AS YOU CALL BEFORE 4:00 PM  At the [facility], you and your health needs are our priority. As part of our continuing mission to provide you with exceptional heart care, we have created designated Provider Care Teams. These Care Teams include your primary Cardiologist (physician) and Advanced Practice Providers (APPs- Physician Assistants and Nurse Practitioners) who all work together to provide you with the care you need, when you need it.   You may see any of the following providers on your designated Care Team at your next follow up:  Dr Arvilla Meres  Dr Carron Curie, NP  Robbie Lis, [place]  Karle Plumber, PharmD   Please be sure to bring in all your medications bottles to every appointment.

## 2020-09-03 NOTE — Progress Notes (Signed)
EPIC Encounter for ICM Monitoring  Patient Name: [name] [name] is a 59 y.o. male Date: 09/03/2020 Primary Care Physican: Arvilla Market, DO Primary Cardiologist:[name] Electrophysiologist:[name] Bi-V Pacing:87.7% 12/16/2021Weight: 329lbs  VT-NS (>4 beats, >176 bpm) 10   Patient seen by Dr Shirlee Latch 12/16 in the office.   OptivolThoracic impedancenormal  Prescribed:  Torsemide20 mg 3 tablets (60 mg total)every morning and 2 tablets (40 mg total) every evening.  Potassium 2 tablets (40 mEq total) twice a day.  Spironolactone 25 mg take 1 tablet daily.  Labs: 09/02/2020 Creatinine 1.03, BUN 12, Potassium 3.9, Sodium 139, GFR >60 07/19/2020 Creatinine1.24, BUN15, Potassium4.7, Sodium142 05/10/2020 Creatinine1.27, BUN12, Potassium4.1, Sodium136, GFR>60  05/05/2020 Creatinine1.35, BUN20, Potassium4.7, Sodium139, HOZ22-48  04/17/2020 Creatinine1.41, BUN15, Potassium4.0, Sodium137, GFR55->60 A complete set of results can be found in Results Review.  Recommendations:Given at 09/02/2020 office visit.   Follow-up plan: ICM clinic phone appointment on2/03/2021. 91 day device clinic remote transmission1/08/2021.   EP/Cardiology Office Visits:11/09/2020 withDr Shirlee Latch.   Copy of ICM check sent to Dr.[name].   3 month ICM trend: 09/02/2020    1 Year ICM trend:       [name], RN 09/03/2020 10:47 AM

## 2020-09-03 NOTE — Progress Notes (Signed)
Advanced Heart Failure Clinic Note   Primary Care: Dr. Windle Guard, MD HF Cardiology: Dr. Shirlee Latch  HPI: [name] [name] is a 59 y.o. male with a past medical history of chronic systolic CHF  (EF 15% in April 2018), dilated NICM s/p BiV ICD, PAF with intermittent compliance with Xarelto, ETOH abuse, tobacco abuse, OSA on CPAP, and HTN.   Admitted 12/25/16-12/27/16 with acute on chronic systolic CHF, volume overload. He was diuresed 11 pounds on IV lasix.  Discharge weight was 294 pounds. It was felt that the etiology of his NICM was ETOH vs hypertension related.   Admitted 5/14-5/16/18 with volume overload, acute respiratory failure requiring Bipap. He was not taking his Entresto as he was confused about dosing and there was a mix up his pharmacy. He was diuresed with IV lasix, discharge weight was 295 pounds.   Admitted 05/30/17-06/02/17 with VT, ICD shock x 4. He is now on amiodarone and sotalol.  Echo in 12/20 showed EF 20-25%, severe LV dilation, moderate LVH, normal RV, small IVC.   He was admitted in 3/21 with ICD shock in setting of hypokalemia and heavy ETOH. Entresto was decreased with low BP.  He was diuresed due to volume overload.   He had left TKR in 11/21 with no complication.   Echo was done today and reviewed, EF 20-25%, moderate LVH, normal RV, normal IVC.   Today he returns for HF followup.  No recent ICD discharge.  He has been more active since TKR, riding stationary bike for 30 minutes/day. No significant exertional dyspnea or chest pain.  No palpitations. He is not lightheaded with standing.  BP stable.  Weight stable. Using CPAP.  Still drinks 4-6 beers on the weekend.   ECG (personally reviewed): NSR, BiV paced  Labs (9/18): K 3.8, creatinine 1.02 Labs (11/18): K 4.4, creatinine 1.19, TSH normal, LFTs normal Labs (11/05/18): K 4 Creatinine 1.05  Labs (12/20): K 4, creatinine 1.19 Labs (3/21): K 4.1, creatinine 1.05, hgb 10.8, LFTs normal, TSH normal Labs (11/21): K 4.7,  creatinine 1.24, hgb 11.9  Medtronic device interrogation: stable thoracic impedance.  ATP in 11/21 and again in 12/21, no ICD discharge.   PMH: 1. Chronic systolic CHF: Medtronic CRT-D.  Nonischemic cardiomyopathy. Possibly ETOH-related.  - LHC (2016): No CAD.  - Echo (2016): EF 20-25%, diffuse hypokinesis.  - Echo (4/18): EF 15%, normal RV size and systolic function.  - Echo (11/18): EF 20%, mildly dilated LV with mild LVH, normal RV size with mildly decreased RV systolic function.  - Echo (12/20): EF 20-25%, severe LV dilation, moderate LVH, normal RV, small IVC.  - Echo (12/21): EF 20-25%, moderate LVH, normal RV, normal IVC. 2. Atrial fibrillation: Paroxysmal.   3. OSA: On CPAP.  4. ETOH abuse: Prior heavy ETOH.  5. H/o VT 6. COPD: Prior smoker.   ROS: All systems reviewed and negative except as per HPI.   Current Outpatient Medications  Medication Sig Dispense Refill  . albuterol (VENTOLIN HFA) 108 (90 Base) MCG/ACT inhaler Inhale 1 puff into the lungs every 6 (six) hours as needed for wheezing or shortness of breath. 18 g 3  . amiodarone (PACERONE) 200 MG tablet Take 1 tablet (200 mg total) by mouth daily. Starting 05-19-2020 60 tablet 3  . atorvastatin (LIPITOR) 80 MG tablet Take 1 tablet (80 mg total) by mouth every evening. 90 tablet 3  . docusate sodium (COLACE) 100 MG capsule Take 1 capsule (100 mg total) by mouth 2 (two) times daily as needed (  for constipation). 60 capsule 0  . potassium chloride SA (KLOR-CON) 20 MEQ tablet Take 2 tablets (40 mEq total) by mouth 2 (two) times daily. 240 tablet 3  . rivaroxaban (XARELTO) 20 MG TABS tablet Take 1 tablet (20 mg total) by mouth daily with supper. 90 tablet 3  . sotalol (BETAPACE) 80 MG tablet Take 1 tablet (80 mg total) by mouth every 12 (twelve) hours. 180 tablet 3  . spironolactone (ALDACTONE) 25 MG tablet Take 1 tablet (25 mg total) by mouth daily. 90 tablet 3  . tiZANidine (ZANAFLEX) 4 MG tablet Take 1 tablet (4 mg total) by  mouth every 8 (eight) hours as needed for muscle spasms. 60 tablet 0  . Ascorbic Acid (VITAMIN C) 100 MG tablet Take 100 mg by mouth daily.    . sacubitril-valsartan (ENTRESTO) 49-51 MG Take 1 tablet by mouth 2 (two) times daily. 180 tablet 3  . torsemide (DEMADEX) 20 MG tablet Take 3 tablets (60 mg total) by mouth every morning AND 2 tablets (40 mg total) every evening. 300 tablet 3   No current facility-administered medications for this encounter.    No Known Allergies    Social History   Socioeconomic History  . Marital status: Single    Spouse name: Not on file  . Number of children: 2  . Years of education: Not on file  . Highest education level: Not on file  Occupational History  . Occupation: Copy  . Occupation: disabled, odd jobs  Tobacco Use  . Smoking status: Former Smoker    Packs/day: 0.50    Years: 4.00    Pack years: 2.00    Types: Cigarettes    Quit date: 10/29/2005    Years since quitting: 14.8  . Smokeless tobacco: Never Used  . Tobacco comment: about a pack a week. social  Vaping Use  . Vaping Use: Never used  Substance and Sexual Activity  . Alcohol use: Yes    Comment: 12 beers per week   . Drug use: Not Currently    Types: Marijuana  . Sexual activity: Yes    Partners: Female  Other Topics Concern  . Not on file  Social History Narrative   ** Merged History Encounter **       Social Determinants of Health   Financial Resource Strain: Not on file  Food Insecurity: Not on file  Transportation Needs: No Transportation Needs  . Lack of Transportation (Medical): No  . Lack of Transportation (Non-Medical): No  Physical Activity: Sufficiently Active  . Days of Exercise per Week: 7 days  . Minutes of Exercise per Session: 30 min  Stress: Not on file  Social Connections: Not on file  Intimate Partner Violence: Not on file      Family History  Problem Relation Age of Onset  . Other Mother        cardiac surgery. late 1990s  . Heart  Problems Mother        CABG AGE 36  . Congestive Heart Failure Father   . Healthy Father        AGE 68  . Coronary artery disease Other   . Healthy Brother        AGE 30  . Healthy Sister        AGE 60  . Healthy Sister        AGE 42  . Healthy Son   . Healthy Son   . Healthy Daughter     Vitals:  09/02/20 1216  BP: 120/78  Pulse: 69  SpO2: 96%  Weight: (!) 149.6 kg (329 lb 12.8 oz)   Wt Readings from Last 3 Encounters:  09/02/20 (!) 149.6 kg (329 lb 12.8 oz)  08/26/20 (!) 148.8 kg (328 lb)  08/11/20 (!) 151 kg (333 lb)     PHYSICAL EXAM: General: NAD Neck: No JVD, no thyromegaly or thyroid nodule.  Lungs: Clear to auscultation bilaterally with normal respiratory effort. CV: Nondisplaced PMI.  Heart regular S1/S2, no S3/S4, no murmur.  Trace ankle edema.  No carotid bruit.  Normal pedal pulses.  Abdomen: Soft, nontender, no hepatosplenomegaly, no distention.  Skin: Intact without lesions or rashes.  Neurologic: Alert and oriented x 3.  Psych: Normal affect. Extremities: No clubbing or cyanosis.  HEENT: Normal.   ASSESSMENT & PLAN: 1. Chronic systolic CHF: NICM, Echo 12/2016 EF 15%. Normal coronaries on cath in July 2016. Medtronic CRT-D device. ETOH vs HTN vs myocarditis as cause of cardiomyopathy. Echo 12/21 showed that EF remains 20-25%.  NYHA class II symptoms.  Weight stable, not volume overloaded on exam or by Optivol.  BP stable.  - Continue torsemide 60 qam/40 qpm with KCl 40 bid.  BMET today.   - Increase Entresto to 49/51 twice a day. BMET 10 days.  - Continue spironolactone 25 mg daily - Intolerant to Coreg in the past with hypotension, he is on sotalol and amiodarone.  - He initially thought [place] caused his last ICD shock, but think this was due to being off amiodarone.  He is willing to try Marcelline Deist again, will start it at next appt.  - He needs to cut back on ETOH as he is still drinking a decent amount.  2. Paroxysmal atrial fibrillation: He is in NSR  today.  - Continue amiodarone 200 mg daily.  Check LFTs/TSH today.  Should have regular eye exam.  - Continue Xarelto 20 mg daily.  CBC today.   3. ETOH abuse: Suspect this contributes to cardiomyopathy.  Still drinking occasionally, I strongly encouraged him to quit.   4. Tobacco abuse: No longer smoking.   5. VT: Followed closely by EP, he is now on amiodarone and sotalol (QTc ok at 473 msec today).  6. OSA: Continue nightly CPAP.   Followup 3 wks with HF pharmacist for med titration, see me in 6 wks.   Marca Ancona, MD 09/03/20

## 2020-09-07 NOTE — Progress Notes (Signed)
Remote ICD transmission.   

## 2020-09-08 ENCOUNTER — Telehealth (HOSPITAL_COMMUNITY): Payer: Self-pay | Admitting: Pharmacy Technician

## 2020-09-08 NOTE — Telephone Encounter (Signed)
Spoke to patient regarding re-enrollment of Entresto assistance with [employer]. Sent in the application via fax.  Will follow up.

## 2020-09-15 ENCOUNTER — Telehealth (HOSPITAL_COMMUNITY): Payer: Self-pay

## 2020-09-15 NOTE — Telephone Encounter (Signed)
I called [name] [name] to schedule an appointment. He stated he was feeling well and was working on something in his garage today and would like to meet next week instead. I agreed and will reach out next week to confirm a day and time.   Jacqualine Code, EMT 09/15/20

## 2020-09-16 ENCOUNTER — Other Ambulatory Visit: Payer: Self-pay

## 2020-09-16 ENCOUNTER — Ambulatory Visit (HOSPITAL_COMMUNITY)
Admission: RE | Admit: 2020-09-16 | Discharge: 2020-09-16 | Disposition: A | Discharge status: Home / Self Care | Payer: Medicare Other | Source: Ambulatory Visit | Attending: Cardiology | Admitting: Cardiology

## 2020-09-16 DIAGNOSIS — I5022 Chronic systolic (congestive) heart failure: Secondary | ICD-10-CM | POA: Insufficient documentation

## 2020-09-16 LAB — BASIC METABOLIC PANEL
Anion gap: 10 (ref 5–15)
BUN: 14 mg/dL (ref 6–20)
CO2: 26 mmol/L (ref 22–32)
Calcium: 9 mg/dL (ref 8.9–10.3)
Chloride: 101 mmol/L (ref 98–111)
Creatinine, Ser: 1.09 mg/dL (ref 0.61–1.24)
GFR, Estimated: >60 mL/min (ref >60)
Glucose, Bld: 193 mg/dL — ABNORMAL HIGH (ref 70–99)
Potassium: 4 mmol/L (ref 3.5–5.1)
Sodium: 137 mmol/L (ref 135–145)

## 2020-09-16 NOTE — Progress Notes (Signed)
Primary Care: Dr. Windle Guard, MD HF Cardiology: Dr. Shirlee Latch  HPI:  [name] [name] is a 59 y.o. male with a past medical history of chronic systolic CHF  (EF 15% in April 2018), dilated NICM s/p BiV ICD, PAF with intermittent compliance with Xarelto, ETOH abuse, tobacco abuse, OSA on CPAP, and HTN.   Admitted 12/25/16-12/27/16 with acute on chronic systolic CHF, volume overload. He was diuresed 11 pounds on IV furosemide.  Discharge weight was 294 pounds. It was felt that the etiology of his NICM was ETOH vs hypertension related.   Admitted 5/14-5/16/18 with volume overload, acute respiratory failure requiring Bipap. He was not taking his Entresto as he was confused about dosing and there was a mix up his pharmacy. He was diuresed with IV furosemide, discharge weight was 295 pounds.   Admitted 05/30/17-06/02/17 with VT, ICD shock x 4. He was placed on amiodarone and sotalol.  Echo in 12/20 showed EF 20-25%, severe LV dilation, moderate LVH, normal RV, small IVC.   He was admitted in 3/21 with ICD shock in setting of hypokalemia and heavy ETOH. Entresto was decreased with low BP.  He was diuresed due to volume overload.   He had left TKR in 11/21 with no complication.   Echo was done today and reviewed, EF 20-25%, moderate LVH, normal RV, normal IVC.   He recently returned to HF Clinic for follow up on 09/02/20.  No recent ICD discharge.  He had been more active since TKR, riding stationary bike for 30 minutes/day. No significant exertional dyspnea or chest pain.  No palpitations. He was not lightheaded with standing.  BP was stable.  Weight was stable. Was using CPAP.  Still drinking 4-6 beers on the weekend.   Today he returns to HF clinic for pharmacist medication titration. At last visit with MD, Sherryll Burger was increased to 49/51 mg BID. Overall he is feeling well today. Occasional dizziness when he stands up, but this is not new and not bothersome. No chest pain or palpitations. Gets SOB after  walking ~100 yards. Weight has been stable at home. He takes torsemide 60 mg QAM/40 mg QPM and has not needed any extra. No LEE, PND or orthopnea. Taking all medications as prescribed with the help of paramedicine.    HF Medications: Entresto 49/51 mg BID Spironolactone 25 mg daily Torsemide 60 mg QAM/40 mg QPM Potassium chloride 40 meq BID  Has the patient been experiencing any side effects to the medications prescribed?  no  Does the patient have any problems obtaining medications due to transportation or finances?   Has Medicare part D. [employer] re-enrollment for 2022 pending.   Understanding of regimen: good Understanding of indications: good Potential of compliance: good Patient understands to avoid NSAIDs. Patient understands to avoid decongestants.    Pertinent Lab Values (09/16/20): Marland Kitchen Serum creatinine 1.09, BUN 14, Potassium 4.0, Sodium 137   Vital Signs: . Weight: 332 lbs (last clinic weight: 329.8 lbs) . Blood pressure: 106/58  . Heart rate: 67   Assessment: 1. Chronic systolic CHF: NICM, Echo 12/2016 EF 15%. Normal coronaries on cath in July 2016. Medtronic CRT-D device. ETOH vs HTN vs myocarditis as cause of cardiomyopathy. Echo 12/21 showed that EF remains 20-25%. - NYHA class II symptoms. Not volume overloaded on exam. - Continue torsemide 60 qam/40 qpm with KCl 40 mg BID.   - Continue Entresto 49/51 BID - Continue spironolactone 25 mg daily - Intolerant to carvedilol in the past with hypotension, he is on sotalol and  amiodarone.  - Start Jardiance 10 mg daily. Previously had been on Farxiga, but insurance benefits investigation revealed that Marcelline Deist is $95/month and London Pepper is $45/month. Patient is amenable to starting Jardiance today. - He needs to cut back on ETOH as he is still drinking a decent amount.  2. Paroxysmal atrial fibrillation:  - Continue amiodarone 200 mg daily.  - Continue Xarelto 20 mg daily.   3. ETOH abuse: Suspect this contributes to  cardiomyopathy.  Still drinking occasionally, I strongly encouraged him to quit.   4. Tobacco abuse: No longer smoking.   5. VT: Followed closely by EP, he is now on amiodarone and sotalol (QTc ok at 473 msec 09/02/20).  6. OSA: Continue nightly CPAP.   Plan: 1) Medication changes: Based on clinical presentation, vital signs and recent labs will start Jardiance 10 mg daily. 2) Follow-up: 1 month with Dr. Jonn Shingles, PharmD, BCPS, [facility], CPP Heart Failure Clinic Pharmacist [phone]

## 2020-09-23 ENCOUNTER — Other Ambulatory Visit (HOSPITAL_COMMUNITY): Payer: Self-pay

## 2020-09-23 ENCOUNTER — Other Ambulatory Visit (HOSPITAL_COMMUNITY): Payer: Self-pay | Admitting: *Deleted

## 2020-09-23 MED ORDER — POTASSIUM CHLORIDE CRYS ER 20 MEQ PO TBCR
40.0000 meq | EXTENDED_RELEASE_TABLET | Freq: Two times a day (BID) | ORAL | 3 refills | Status: DC
Start: 2020-09-23 — End: 2021-02-08

## 2020-09-23 MED ORDER — ATORVASTATIN CALCIUM 80 MG PO TABS
80.0000 mg | ORAL_TABLET | Freq: Every evening | ORAL | 3 refills | Status: DC
Start: 2020-09-23 — End: 2021-06-13

## 2020-09-23 MED ORDER — SPIRONOLACTONE 25 MG PO TABS: 25.0000 mg | ORAL_TABLET | Freq: Every day | ORAL | 3 refills | Status: DC

## 2020-09-23 MED ORDER — TORSEMIDE 20 MG PO TABS: ORAL_TABLET | ORAL | 3 refills | Status: DC

## 2020-09-23 MED ORDER — RIVAROXABAN 20 MG PO TABS: 20.0000 mg | ORAL_TABLET | Freq: Every day | ORAL | 3 refills | Status: DC

## 2020-09-23 MED ORDER — SOTALOL HCL 80 MG PO TABS: 80.0000 mg | ORAL_TABLET | Freq: Two times a day (BID) | ORAL | 3 refills | Status: DC

## 2020-09-23 MED ORDER — ENTRESTO 49-51 MG PO TABS
1.0000 | ORAL_TABLET | Freq: Two times a day (BID) | ORAL | 3 refills | Status: DC
Start: 2020-09-23 — End: 2021-07-15

## 2020-09-23 MED ORDER — AMIODARONE HCL 200 MG PO TABS
200.0000 mg | ORAL_TABLET | Freq: Every day | ORAL | 3 refills | Status: DC
Start: 2020-09-23 — End: 2021-09-27

## 2020-09-23 NOTE — Progress Notes (Signed)
Paramedicine Encounter    Patient ID: [name] [name], male    DOB: 08/31/1961, 60 y.o.   MRN: [id number]   Patient Care Team: Arvilla Market, DO as PCP - General (Family Medicine) [name], [name], MD as PCP - Advanced Heart Failure (Cardiology) [name], MD as PCP - Electrophysiology (Cardiology) [name], MD as PCP - Cardiology (Cardiology) [name], MD (Family Medicine) [name], LCSW as Social Worker (Licensed Clinical Social Worker)  Patient Active Problem List   Diagnosis Date Noted  . Primary osteoarthritis of left knee 07/30/2020  . Status post total left knee replacement 07/30/2020  . Status post total knee replacement, left 07/30/2020  . Ventricular tachycardia (HCC) 04/17/2020  . Primary osteoarthritis of right knee 01/30/2020  . Hypokalemia 11/23/2019  . Primary osteoarthritis of right hip 11/03/2018  . VT (ventricular tachycardia) (HCC) 05/30/2017  . AKI (acute kidney injury) (HCC)   . Obesity 12/25/2016  . OSA (obstructive sleep apnea) 12/25/2016  . Hyperglycemia 12/25/2016  . Acute respiratory failure with hypoxia (HCC) 12/25/2016  . Chronic anticoagulation 06/27/2016  . Encounter for therapeutic drug monitoring 04/19/2015  . S/P ICD (internal cardiac defibrillator) procedure Medtronic CRT ICD 04/15/2015  . Atrial fibrillation with RVR - Paroxysmal 04/08/2015  . NICM 04/06/2015  . Troponin level elevated 04/06/2015  . Rectal bleeding 10/26/2011  . Hydrocele, left 05/24/2011  . Tobacco use disorder- 1/2 pk a day   . Alcohol abuse   . Obesity 09/27/2008  . CAD- minor CAD 2009 and 2016 09/27/2008  . VENTRICULAR TACHYCARDIA 09/27/2008  . Essential hypertension 06/19/2008  . Chronic systolic heart failure (HCC) 06/19/2008    Current Outpatient Medications:  .  albuterol (VENTOLIN HFA) 108 (90 Base) MCG/ACT inhaler, Inhale 1 puff into the lungs every 6 (six) hours as needed for wheezing or shortness of breath., Disp: 18  g, Rfl: 3 .  amiodarone (PACERONE) 200 MG tablet, Take 1 tablet (200 mg total) by mouth daily. Starting 05-19-2020, Disp: 60 tablet, Rfl: 3 .  Ascorbic Acid (VITAMIN C) 100 MG tablet, Take 100 mg by mouth daily., Disp: , Rfl:  .  atorvastatin (LIPITOR) 80 MG tablet, Take 1 tablet (80 mg total) by mouth every evening., Disp: 90 tablet, Rfl: 3 .  docusate sodium (COLACE) 100 MG capsule, Take 1 capsule (100 mg total) by mouth 2 (two) times daily as needed (for constipation)., Disp: 60 capsule, Rfl: 0 .  potassium chloride SA (KLOR-CON) 20 MEQ tablet, Take 2 tablets (40 mEq total) by mouth 2 (two) times daily., Disp: 240 tablet, Rfl: 3 .  rivaroxaban (XARELTO) 20 MG TABS tablet, Take 1 tablet (20 mg total) by mouth daily with supper., Disp: 90 tablet, Rfl: 3 .  sacubitril-valsartan (ENTRESTO) 49-51 MG, Take 1 tablet by mouth 2 (two) times daily., Disp: 180 tablet, Rfl: 3 .  sotalol (BETAPACE) 80 MG tablet, Take 1 tablet (80 mg total) by mouth every 12 (twelve) hours., Disp: 180 tablet, Rfl: 3 .  spironolactone (ALDACTONE) 25 MG tablet, Take 1 tablet (25 mg total) by mouth daily., Disp: 90 tablet, Rfl: 3 .  torsemide (DEMADEX) 20 MG tablet, Take 3 tablets (60 mg total) by mouth every morning AND 2 tablets (40 mg total) every evening., Disp: 300 tablet, Rfl: 3 .  tiZANidine (ZANAFLEX) 4 MG tablet, Take 1 tablet (4 mg total) by mouth every 8 (eight) hours as needed for muscle spasms. (Patient not taking: Reported on 09/23/2020), Disp: 60 tablet, Rfl: 0 No Known Allergies  Social History   Socioeconomic History  . Marital status: Single    Spouse name: Not on file  . Number of children: 2  . Years of education: Not on file  . Highest education level: Not on file  Occupational History  . Occupation: Copy  . Occupation: disabled, odd jobs  Tobacco Use  . Smoking status: Former Smoker    Packs/day: 0.50    Years: 4.00    Pack years: 2.00    Types: Cigarettes    Quit date: 10/29/2005     Years since quitting: 14.9  . Smokeless tobacco: Never Used  . Tobacco comment: about a pack a week. social  Vaping Use  . Vaping Use: Never used  Substance and Sexual Activity  . Alcohol use: Yes    Comment: 12 beers per week   . Drug use: Not Currently    Types: Marijuana  . Sexual activity: Yes    Partners: Female  Other Topics Concern  . Not on file  Social History Narrative   ** Merged History Encounter **       Social Determinants of Health   Financial Resource Strain: Not on file  Food Insecurity: Not on file  Transportation Needs: No Transportation Needs  . Lack of Transportation (Medical): No  . Lack of Transportation (Non-Medical): No  Physical Activity: Sufficiently Active  . Days of Exercise per Week: 7 days  . Minutes of Exercise per Session: 30 min  Stress: Not on file  Social Connections: Not on file  Intimate Partner Violence: Not on file    Physical Exam Cardiovascular:     Rate and Rhythm: Normal rate and regular rhythm.     Pulses: Normal pulses.  Pulmonary:     Effort: Pulmonary effort is normal.     Breath sounds: Normal breath sounds.  Musculoskeletal:        General: Normal range of motion.     Right lower leg: No edema.     Left lower leg: No edema.  Skin:    General: Skin is warm and dry.     Capillary Refill: Capillary refill takes less than 2 seconds.  Neurological:     Mental Status: He is alert and oriented to person, place, and time.  Psychiatric:        Mood and Affect: Mood normal.         Future Appointments  Date Time Provider Department Center  09/30/2020 11:00 AM [facility] PHARMACY [facility] None  10/25/2020  9:40 AM [facility] DEVICE REMOTES [facility]  11/09/2020  9:20 AM [name], MD [facility] None  11/25/2020  4:15 PM [facility] DEVICE REMOTES [facility]  02/24/2021  4:15 PM [facility] DEVICE REMOTES [facility]  05/26/2021  4:15 PM [facility] DEVICE REMOTES [facility]  08/25/2021  4:15 PM [facility] DEVICE REMOTES [facility]    BP 117/64 (BP Location: Left Arm, Patient Position: Sitting, Cuff Size: Large)   Pulse 70   Resp 16   Wt (!) 328 lb (148.8 kg)   SpO2 98%   BMI 39.93 kg/m   Weight yesterday- 327 lb Last visit weight- 329.8 lb  Ms Ascher was seen at home today and reported feeling well. He denied chest pain, SOB, headache, dizziness, orthopnea, fever or cough over the past week. He stated he has been compliant with his medications over the past week and his weight has been stable. His medications were verified and his pillbox was refilled. We set up his new mail  order pharmacy through [facility] and I had all necessary prescriptions forwarded to them. I will follow up next week at his clinic appointment.   Jacquiline Doe, EMT 09/23/20  ACTION: Home visit completed Next visit planned for 1 week

## 2020-09-23 NOTE — Addendum Note (Signed)
Addended by: Noralee Space on: 09/23/2020 11:12 AM   Modules accepted: Orders

## 2020-09-30 ENCOUNTER — Ambulatory Visit (HOSPITAL_COMMUNITY)
Admission: RE | Admit: 2020-09-30 | Discharge: 2020-09-30 | Disposition: A | Discharge status: Home / Self Care | Payer: Medicare HMO | Source: Ambulatory Visit | Attending: Cardiology | Admitting: Cardiology

## 2020-09-30 ENCOUNTER — Telehealth (HOSPITAL_COMMUNITY): Payer: Self-pay | Admitting: Pharmacist

## 2020-09-30 ENCOUNTER — Other Ambulatory Visit: Payer: Self-pay

## 2020-09-30 VITALS — BP 106/58 | HR 67 | Wt 332.0 lb

## 2020-09-30 DIAGNOSIS — Z87891 Personal history of nicotine dependence: Secondary | ICD-10-CM | POA: Insufficient documentation

## 2020-09-30 DIAGNOSIS — I110 Hypertensive heart disease with heart failure: Secondary | ICD-10-CM | POA: Insufficient documentation

## 2020-09-30 DIAGNOSIS — G4733 Obstructive sleep apnea (adult) (pediatric): Secondary | ICD-10-CM | POA: Insufficient documentation

## 2020-09-30 DIAGNOSIS — I428 Other cardiomyopathies: Secondary | ICD-10-CM | POA: Insufficient documentation

## 2020-09-30 DIAGNOSIS — Z7901 Long term (current) use of anticoagulants: Secondary | ICD-10-CM | POA: Diagnosis not present

## 2020-09-30 DIAGNOSIS — Z95810 Presence of automatic (implantable) cardiac defibrillator: Secondary | ICD-10-CM | POA: Insufficient documentation

## 2020-09-30 DIAGNOSIS — Z9989 Dependence on other enabling machines and devices: Secondary | ICD-10-CM | POA: Insufficient documentation

## 2020-09-30 DIAGNOSIS — Z79899 Other long term (current) drug therapy: Secondary | ICD-10-CM | POA: Insufficient documentation

## 2020-09-30 DIAGNOSIS — I5022 Chronic systolic (congestive) heart failure: Secondary | ICD-10-CM | POA: Insufficient documentation

## 2020-09-30 DIAGNOSIS — F1010 Alcohol abuse, uncomplicated: Secondary | ICD-10-CM | POA: Insufficient documentation

## 2020-09-30 DIAGNOSIS — I472 Ventricular tachycardia: Secondary | ICD-10-CM | POA: Diagnosis not present

## 2020-09-30 DIAGNOSIS — I480 Paroxysmal atrial fibrillation: Secondary | ICD-10-CM | POA: Insufficient documentation

## 2020-09-30 MED ORDER — EMPAGLIFLOZIN 10 MG PO TABS: 10.0000 mg | ORAL_TABLET | Freq: Every day | ORAL | 11 refills | Status: DC

## 2020-09-30 NOTE — Telephone Encounter (Signed)
Sent in Manufacturer's Assistance application to [employer] for Jardiance.   Application pending, will continue to follow.  Katria Botts, PharmD, BCPS, BCCP, CPP Heart Failure Clinic Pharmacist [phone]  

## 2020-09-30 NOTE — Patient Instructions (Signed)
It was a pleasure seeing you today!  MEDICATIONS: -We are changing your medications today -Start Jardiance 10 mg (1 tablet) daily -Call if you have questions about your medications.   NEXT APPOINTMENT: Return to clinic in 1 month with Dr. [name].  In general, to take care of your heart failure: -Limit your fluid intake to 2 Liters (half-gallon) per day.   -Limit your salt intake to ideally 2-3 grams (2000-3000 mg) per day. -Weigh yourself daily and record, and bring that "weight diary" to your next appointment.  (Weight gain of 2-3 pounds in 1 day typically means fluid weight.) -The medications for your heart are to help your heart and help you live longer.   -Please contact us before stopping any of your heart medications.  Call the clinic at [phone] with questions or to reschedule future appointments.  

## 2020-10-01 ENCOUNTER — Telehealth (HOSPITAL_COMMUNITY): Payer: Self-pay | Admitting: Pharmacy Technician

## 2020-10-01 NOTE — Telephone Encounter (Signed)
Sent patient 14 day free card for Jardiance.  Archer Asa, CPhT

## 2020-10-07 NOTE — Telephone Encounter (Signed)
Received message from [facility] that patient needs to apply for and be denied Medicare LIS before he can be approved for Jardiance patient assistance. Will forward to social work to start application process.  Karle Plumber, PharmD, BCPS, BCCP, CPP Heart Failure Clinic Pharmacist [phone]

## 2020-10-14 ENCOUNTER — Telehealth (HOSPITAL_COMMUNITY): Payer: Self-pay | Admitting: Licensed Clinical Social Worker

## 2020-10-14 NOTE — Telephone Encounter (Signed)
Entered in error

## 2020-10-14 NOTE — Telephone Encounter (Signed)
CSW consulted to help pt apply for Extra help so we could proceed with [facility] [employer] application for [place] assistance.  CSW had already assisted with pt applying for Extra Help last year to try and help with medication expenses.  Pt was denied at that time.  CSW called pt to see if he still had denial letter- pt unable to find so assisted in calling social security and requested another one be sent out- they will mail to patient and patient informed to call us once it arrives.  Will continue to follow and assist as needed  [name], LCSW Clinical Social Worker Advanced Heart Failure Clinic Desk#: [phone] Cell#: [phone]

## 2020-10-22 ENCOUNTER — Telehealth (HOSPITAL_COMMUNITY): Payer: Self-pay | Admitting: Pharmacy Technician

## 2020-10-22 NOTE — Telephone Encounter (Signed)
Sent LIS denial letter to [facility].

## 2020-10-25 ENCOUNTER — Ambulatory Visit (INDEPENDENT_AMBULATORY_CARE_PROVIDER_SITE_OTHER): Payer: Medicare HMO

## 2020-10-25 DIAGNOSIS — I5022 Chronic systolic (congestive) heart failure: Secondary | ICD-10-CM | POA: Diagnosis not present

## 2020-10-25 DIAGNOSIS — Z95810 Presence of automatic (implantable) cardiac defibrillator: Secondary | ICD-10-CM

## 2020-10-26 NOTE — Progress Notes (Signed)
EPIC Encounter for ICM Monitoring  Patient Name: [name] [name] is a 60 y.o. male Date: 10/26/2020 Primary Care Physican: Arvilla Market, DO Primary Cardiologist:[name] Electrophysiologist:[name] Bi-V Pacing:89% 2/8/2022Weight: [id number]  Spoke with patient and reports feeling well at this time.  Denies fluid symptoms.  He was eating a bacon and increased fluid this past weekend which may contribute to decreased impedance.    OptivolThoracic impedance suggesting possible fluid accumulation 10/19/2020.  Prescribed:  Torsemide20 mg 3 tablets (60 mg total)every morning and 2 tablets (40 mg total) every evening.  Potassium 2 tablets (40 mEq total) twice a day.  Spironolactone 25 mg take 1 tablet daily.  Labs: 09/16/2021 Creatinine 1.09, BUN 14, Potassium 4.0, Sodium 137, GFR >60 09/02/2020 Creatinine 1.03, BUN 12, Potassium 3.9, Sodium 139, GFR >60 07/19/2020 Creatinine1.24, BUN15, Potassium4.7, Sodium142 05/10/2020 Creatinine1.27, BUN12, Potassium4.1, Sodium136, GFR>60  05/05/2020 Creatinine1.35, BUN20, Potassium4.7, Sodium139, HAL93-79  04/17/2020 Creatinine1.41, BUN15, Potassium4.0, Sodium137, GFR55->60 A complete set of results can be found in Results Review.  Recommendations:Advised to limit salt and fluid intake.  Encouraged to call for changes in condition.   Follow-up plan: ICM clinic phone appointment on 11/26/2020. 91 day device clinic remote transmission3/06/2021.   EP/Cardiology Office Visits:11/09/2020 withDr Shirlee Latch.   Copy of ICM check sent to Dr.[name].   3 month ICM trend: 10/25/2020.    1 Year ICM trend:       [name], RN 10/26/2020 12:24 PM

## 2020-11-01 ENCOUNTER — Telehealth (HOSPITAL_COMMUNITY): Payer: Self-pay

## 2020-11-01 NOTE — Telephone Encounter (Signed)
I called [name] to see how he has been doing. He reported feeling well, denying chest pain, SOB, headache, dizziness, orthopnea, fever or cough over the past several weeks. He stated he has been compliant with his medications and has not been having any trouble affording them with his new insurance. He also advised he was accepted for assistance with Jardiance and will begin receiving it in the mail. He stated he does not need any help at this time but would reach out if that should change. I will follow up as needed.  Jacqualine Code, EMT 11/01/20

## 2020-11-03 NOTE — Telephone Encounter (Signed)
Advanced Heart Failure Patient Advocate Encounter   Patient was approved to receive Entresto from [employer]  Patient ID: [id number] Effective dates: 10/28/20 through 09/17/21  Advanced Heart Failure Patient Advocate Encounter   Patient was approved to receive Jardiance from [employer]  Effective dates: 10/07/20 through 09/17/21  Left patient message. Reminded him not to pay for [facility] as he did last year. It will come straight to him.  Archer Asa, CPhT

## 2020-11-09 ENCOUNTER — Ambulatory Visit (HOSPITAL_COMMUNITY)
Admission: RE | Admit: 2020-11-09 | Discharge: 2020-11-09 | Disposition: A | Discharge status: Home / Self Care | Payer: Medicare HMO | Source: Ambulatory Visit | Attending: Cardiology | Admitting: Cardiology

## 2020-11-09 ENCOUNTER — Encounter (HOSPITAL_COMMUNITY): Payer: Self-pay | Admitting: Cardiology

## 2020-11-09 ENCOUNTER — Other Ambulatory Visit: Payer: Self-pay

## 2020-11-09 ENCOUNTER — Telehealth: Payer: Self-pay | Admitting: Cardiology

## 2020-11-09 ENCOUNTER — Telehealth (HOSPITAL_COMMUNITY): Payer: Self-pay | Admitting: Pharmacy Technician

## 2020-11-09 VITALS — BP 102/60 | HR 69 | Wt 329.6 lb

## 2020-11-09 DIAGNOSIS — Z7984 Long term (current) use of oral hypoglycemic drugs: Secondary | ICD-10-CM | POA: Insufficient documentation

## 2020-11-09 DIAGNOSIS — I480 Paroxysmal atrial fibrillation: Secondary | ICD-10-CM | POA: Insufficient documentation

## 2020-11-09 DIAGNOSIS — I472 Ventricular tachycardia, unspecified: Secondary | ICD-10-CM

## 2020-11-09 DIAGNOSIS — R0602 Shortness of breath: Secondary | ICD-10-CM | POA: Diagnosis not present

## 2020-11-09 DIAGNOSIS — I110 Hypertensive heart disease with heart failure: Secondary | ICD-10-CM | POA: Insufficient documentation

## 2020-11-09 DIAGNOSIS — I5022 Chronic systolic (congestive) heart failure: Secondary | ICD-10-CM | POA: Insufficient documentation

## 2020-11-09 DIAGNOSIS — F1010 Alcohol abuse, uncomplicated: Secondary | ICD-10-CM | POA: Diagnosis not present

## 2020-11-09 DIAGNOSIS — Z87891 Personal history of nicotine dependence: Secondary | ICD-10-CM | POA: Diagnosis not present

## 2020-11-09 DIAGNOSIS — Z7901 Long term (current) use of anticoagulants: Secondary | ICD-10-CM | POA: Diagnosis not present

## 2020-11-09 DIAGNOSIS — Z79899 Other long term (current) drug therapy: Secondary | ICD-10-CM | POA: Diagnosis not present

## 2020-11-09 DIAGNOSIS — Z8249 Family history of ischemic heart disease and other diseases of the circulatory system: Secondary | ICD-10-CM | POA: Insufficient documentation

## 2020-11-09 DIAGNOSIS — G4733 Obstructive sleep apnea (adult) (pediatric): Secondary | ICD-10-CM | POA: Insufficient documentation

## 2020-11-09 DIAGNOSIS — I428 Other cardiomyopathies: Secondary | ICD-10-CM | POA: Insufficient documentation

## 2020-11-09 LAB — COMPREHENSIVE METABOLIC PANEL
ALT: 20 U/L (ref 0–44)
AST: 16 U/L (ref 15–41)
Albumin: 3.8 g/dL (ref 3.5–5.0)
Alkaline Phosphatase: 80 U/L (ref 38–126)
Anion gap: 7 (ref 5–15)
BUN: 17 mg/dL (ref 6–20)
CO2: 28 mmol/L (ref 22–32)
Calcium: 9.3 mg/dL (ref 8.9–10.3)
Chloride: 102 mmol/L (ref 98–111)
Creatinine, Ser: 1.29 mg/dL — ABNORMAL HIGH (ref 0.61–1.24)
GFR, Estimated: >60 mL/min (ref >60)
Glucose, Bld: 99 mg/dL (ref 70–99)
Potassium: 4.8 mmol/L (ref 3.5–5.1)
Sodium: 137 mmol/L (ref 135–145)
Total Bilirubin: 1.2 mg/dL (ref 0.3–1.2)
Total Protein: 6.5 g/dL (ref 6.5–8.1)

## 2020-11-09 LAB — TSH: TSH: 1.15 u[IU]/mL (ref 0.350–4.500)

## 2020-11-09 NOTE — Patient Instructions (Addendum)
EKG done today.  Labs done today. We will contact you only if your labs are abnormal.  INCREASE Toresmide to 60mg  (3 tablets) by mouth 2 times daily for 2 days THEN decrease back down to 60mg  (3 tablets) every morning and 40mg  (2 tablets) by mouth every evening.   No other medication changes were made. Please continue all current medications as prescribed.  Please call [phone] to get shipping updates about your   You have been referred to Dr. at [facility] for Sleep Apnea. Her office will contact you to schedule an appointment.  Your physician recommends that you schedule a follow-up appointment in: 3 months with our APP Clinic here in office.    If you have any questions or concerns before your next appointment please send London Pepper a message through [place] or call our office at [phone].    TO LEAVE A MESSAGE FOR THE NURSE SELECT OPTION 2, PLEASE LEAVE A MESSAGE INCLUDING: . YOUR NAME . DATE OF BIRTH . CALL BACK NUMBER . REASON FOR CALL**this is important as we prioritize the call backs  YOU WILL RECEIVE A CALL BACK THE SAME DAY AS LONG AS YOU CALL BEFORE 4:00 PM   Do the following things EVERYDAY: 1) Weigh yourself in the morning before breakfast. Write it down and keep it in a log. 2) Take your medicines as prescribed 3) Eat low salt foods--Limit salt (sodium) to 2000 mg per day.  4) Stay as active as you can everyday 5) Limit all fluids for the day to less than 2 liters   At the [facility] Failure [facility], you and your health needs are our priority. As part of our continuing mission to provide you with exceptional heart care, we have created designated Provider Care Teams. These Care Teams include your primary Cardiologist (physician) and Advanced Practice Providers (APPs- Physician Assistants and Nurse Practitioners) who all work together to provide you with the care you need, when you need it.   You may see any of the following providers on  your designated Care Team at your next follow up: [address] [place] . Dr [phone] . Marland Kitchen, NP . Arvilla Meres, PA . Marca Ancona, PharmD   Please be sure to bring in all your medications bottles to every appointment.

## 2020-11-09 NOTE — Telephone Encounter (Signed)
Advanced Heart Failure Patient Advocate Encounter   Patient was approved to receive Jardiance from [employer]  Effective dates: 10/26/20 through 09/17/21  Advanced Heart Failure Patient Advocate Encounter   Patient was approved to receive Entresto from [employer]  Patient ID: [id number] Effective dates: 10/28/20 through 09/17/21  Called and spoke with the patient.  Archer Asa, CPhT

## 2020-11-09 NOTE — Progress Notes (Signed)
Advanced Heart Failure Clinic Note   Primary Care: Dr. Windle Guard, MD HF Cardiology: Dr. Shirlee Latch  HPI: [name] [name] is a 60 y.o. male with a past medical history of chronic systolic CHF  (EF 15% in April 2018), dilated NICM s/p BiV ICD, PAF with intermittent compliance with Xarelto, ETOH abuse, tobacco abuse, OSA on CPAP, and HTN.   Admitted 12/25/16-12/27/16 with acute on chronic systolic CHF, volume overload. He was diuresed 11 pounds on IV lasix.  Discharge weight was 294 pounds. It was felt that the etiology of his NICM was ETOH vs hypertension related.   Admitted 5/14-5/16/18 with volume overload, acute respiratory failure requiring Bipap. He was not taking his Entresto as he was confused about dosing and there was a mix up his pharmacy. He was diuresed with IV lasix, discharge weight was 295 pounds.   Admitted 05/30/17-06/02/17 with VT, ICD shock x 4. He is now on amiodarone and sotalol.  Echo in 12/20 showed EF 20-25%, severe LV dilation, moderate LVH, normal RV, small IVC.   He was admitted in 3/21 with ICD shock in setting of hypokalemia and heavy ETOH. Entresto was decreased with low BP.  He was diuresed due to volume overload.   He had left TKR in 11/21 with no complication.   Echo in 12/21 showed EF 20-25%, moderate LVH, normal RV, normal IVC.   Today he returns for HF followup.  No recent ICD discharge.  Symptomatically stable.  He rides his exercise bike now for about 30 minutes/day.  He is short of breath with heavy exertion but no problems walking on flat ground.  No chest pain or lightheadedness.  Using CPAP but feels like it is "not working." Weight is stable.  Once or twice a week will drink up to 5 beers.  He says that this is a lot less than in the past.   ECG (personally reviewed): NSR, BiV paced with PVCs, QTc 486 msec  Labs (9/18): K 3.8, creatinine 1.02 Labs (11/18): K 4.4, creatinine 1.19, TSH normal, LFTs normal Labs (11/05/18): K 4 Creatinine 1.05  Labs (12/20): K 4,  creatinine 1.19 Labs (3/21): K 4.1, creatinine 1.05, hgb 10.8, LFTs normal, TSH normal Labs (11/21): K 4.7, creatinine 1.24, hgb 11.9 Labs (12/21): K 4, creatinine 1.05, TSH and LFTs normal  [employer] device interrogation: Thoracic impedance trending down but fluid index still < threshold.  No AF/VT.   PMH: 1. Chronic systolic CHF: Medtronic CRT-D.  Nonischemic cardiomyopathy. Possibly ETOH-related.  - LHC (2016): No CAD.  - Echo (2016): EF 20-25%, diffuse hypokinesis.  - Echo (4/18): EF 15%, normal RV size and systolic function.  - Echo (11/18): EF 20%, mildly dilated LV with mild LVH, normal RV size with mildly decreased RV systolic function.  - Echo (12/20): EF 20-25%, severe LV dilation, moderate LVH, normal RV, small IVC.  - Echo (12/21): EF 20-25%, moderate LVH, normal RV, normal IVC. 2. Atrial fibrillation: Paroxysmal.   3. OSA: On CPAP.  4. ETOH abuse: Prior heavy ETOH.  5. H/o VT 6. COPD: Prior smoker.   ROS: All systems reviewed and negative except as per HPI.   Current Outpatient Medications  Medication Sig Dispense Refill  . albuterol (VENTOLIN HFA) 108 (90 Base) MCG/ACT inhaler Inhale 1 puff into the lungs every 6 (six) hours as needed for wheezing or shortness of breath. 18 g 3  . amiodarone (PACERONE) 200 MG tablet Take 1 tablet (200 mg total) by mouth daily. 180 tablet 3  . Ascorbic Acid (  VITAMIN C) 100 MG tablet Take 100 mg by mouth daily.    Marland Kitchen atorvastatin (LIPITOR) 80 MG tablet Take 1 tablet (80 mg total) by mouth every evening. 90 tablet 3  . docusate sodium (COLACE) 100 MG capsule Take 1 capsule (100 mg total) by mouth 2 (two) times daily as needed (for constipation). 60 capsule 0  . empagliflozin (JARDIANCE) 10 MG TABS tablet Take 1 tablet (10 mg total) by mouth daily. 30 tablet 11  . potassium chloride SA (KLOR-CON) 20 MEQ tablet Take 2 tablets (40 mEq total) by mouth 2 (two) times daily. 360 tablet 3  . rivaroxaban (XARELTO) 20 MG TABS tablet Take 1 tablet (20  mg total) by mouth daily with supper. 30 tablet 3  . sacubitril-valsartan (ENTRESTO) 49-51 MG Take 1 tablet by mouth 2 (two) times daily. 180 tablet 3  . sotalol (BETAPACE) 80 MG tablet Take 1 tablet (80 mg total) by mouth every 12 (twelve) hours. 180 tablet 3  . spironolactone (ALDACTONE) 25 MG tablet Take 1 tablet (25 mg total) by mouth daily. 90 tablet 3  . torsemide (DEMADEX) 20 MG tablet Take 3 tablets (60 mg total) by mouth every morning AND 2 tablets (40 mg total) every evening. 450 tablet 3   No current facility-administered medications for this encounter.    No Known Allergies    Social History   Socioeconomic History  . Marital status: Single    Spouse name: Not on file  . Number of children: 2  . Years of education: Not on file  . Highest education level: Not on file  Occupational History  . Occupation: Copy  . Occupation: disabled, odd jobs  Tobacco Use  . Smoking status: Former Smoker    Packs/day: 0.50    Years: 4.00    Pack years: 2.00    Types: Cigarettes    Quit date: 10/29/2005    Years since quitting: 15.0  . Smokeless tobacco: Never Used  . Tobacco comment: about a pack a week. social  Vaping Use  . Vaping Use: Never used  Substance and Sexual Activity  . Alcohol use: Yes    Comment: 12 beers per week   . Drug use: Not Currently    Types: Marijuana  . Sexual activity: Yes    Partners: Female  Other Topics Concern  . Not on file  Social History Narrative   ** Merged History Encounter **       Social Determinants of Health   Financial Resource Strain: Not on file  Food Insecurity: Not on file  Transportation Needs: No Transportation Needs  . Lack of Transportation (Medical): No  . Lack of Transportation (Non-Medical): No  Physical Activity: Sufficiently Active  . Days of Exercise per Week: 7 days  . Minutes of Exercise per Session: 30 min  Stress: Not on file  Social Connections: Not on file  Intimate Partner Violence: Not on file       Family History  Problem Relation Age of Onset  . Other Mother        cardiac surgery. late 1990s  . Heart Problems Mother        CABG AGE 75  . Congestive Heart Failure Father   . Healthy Father        AGE 103  . Coronary artery disease Other   . Healthy Brother        AGE 55  . Healthy Sister        AGE 38  . Healthy  Sister        AGE 72  . Healthy Son   . Healthy Son   . Healthy Daughter     Vitals:   11/09/20 0934  BP: 102/60  Pulse: 69  SpO2: 94%  Weight: (!) 149.5 kg (329 lb 9.6 oz)   Wt Readings from Last 3 Encounters:  11/09/20 (!) 149.5 kg (329 lb 9.6 oz)  09/30/20 (!) 150.6 kg (332 lb)  09/23/20 (!) 148.8 kg (328 lb)     PHYSICAL EXAM: General: NAD Neck: No JVD, no thyromegaly or thyroid nodule.  Lungs: Clear to auscultation bilaterally with normal respiratory effort. CV: Nondisplaced PMI.  Heart regular S1/S2, no S3/S4, no murmur.  No peripheral edema.  No carotid bruit.  Normal pedal pulses.  Abdomen: Soft, nontender, no hepatosplenomegaly, no distention.  Skin: Intact without lesions or rashes.  Neurologic: Alert and oriented x 3.  Psych: Normal affect. Extremities: No clubbing or cyanosis.  HEENT: Normal.   ASSESSMENT & PLAN: 1. Chronic systolic CHF: NICM, Echo 12/2016 EF 15%. Normal coronaries on cath in July 2016. Medtronic CRT-D device. ETOH vs HTN vs myocarditis as cause of cardiomyopathy. Echo 12/21 showed that EF remains 20-25%.  NYHA class II symptoms.  Weight stable, not volume overloaded on exam though thoracic impedance is trending down on Optivol.  BP stable.  - Increase torsemide to 60 mg bid x 2 days then back to 60 qam/40 qpm.  BMET today.    - Continue Entresto 49/51 twice a day, BP relatively low so will not increase today.   - Continue spironolactone 25 mg daily - Intolerant to Coreg in the past with hypotension, he is on sotalol and amiodarone.  - Continue empagliflozin 10 mg daily, refill today.   - He needs to cut back on  ETOH as he is still drinking a decent amount though a lot less than in the past.  2. Paroxysmal atrial fibrillation: He is in NSR today.  - Continue amiodarone 200 mg daily.  Check LFTs/TSH today.  Should have regular eye exam.  - Continue Xarelto 20 mg daily.   3. ETOH abuse: Suspect this contributes to cardiomyopathy.  Still drinking occasionally, I strongly encouraged him to quit.   4. Tobacco abuse: No longer smoking.   5. VT: Followed closely by EP, he is now on amiodarone and sotalol (QTc ok at 486 msec today).  6. OSA: Continue nightly CPAP. - He does not feel like it is working properly, will have him see Dr. Mayford Knife for sleep medicine.    Followup 3 mos with APP.  Marca Ancona, MD 11/09/20

## 2020-11-09 NOTE — Telephone Encounter (Signed)
Dr. Shirlee Latch placed referral for Dr. Mayford Knife, can you place sleep study order?

## 2020-11-11 NOTE — Progress Notes (Signed)
Referral was placed at patients appointment.

## 2020-11-16 NOTE — Telephone Encounter (Signed)
Split night to be ordered

## 2020-11-25 ENCOUNTER — Ambulatory Visit (INDEPENDENT_AMBULATORY_CARE_PROVIDER_SITE_OTHER): Payer: Medicare HMO

## 2020-11-25 DIAGNOSIS — I428 Other cardiomyopathies: Secondary | ICD-10-CM

## 2020-11-26 ENCOUNTER — Ambulatory Visit (INDEPENDENT_AMBULATORY_CARE_PROVIDER_SITE_OTHER): Payer: Medicare HMO

## 2020-11-26 DIAGNOSIS — Z95810 Presence of automatic (implantable) cardiac defibrillator: Secondary | ICD-10-CM

## 2020-11-26 DIAGNOSIS — I5022 Chronic systolic (congestive) heart failure: Secondary | ICD-10-CM

## 2020-11-26 NOTE — Progress Notes (Signed)
EPIC Encounter for ICM Monitoring  Patient Name: [name] [name] is a 60 y.o. male Date: 11/26/2020 Primary Care Physican: Arvilla Market, DO Primary Cardiologist:[name] Electrophysiologist:[name] Bi-V Pacing:86.9% 3/11/2022Weight: 329lbs  Spoke with patient and reports feeling well at this time.  Denies fluid symptoms.    OptivolThoracic impedance suggesting normal fluid levels.  Prescribed:  Torsemide20 mg 3 tablets (60 mg total)every morning and 2 tablets (40 mg total) every evening.  Potassium 2 tablets (40 mEq total) twice a day.  Spironolactone 25 mg take 1 tablet daily.  Labs: 11/09/2020 Creatinine 1.29, BUN 17, Potassium 4.8, Sodium 137, GFR >60 09/16/2020 Creatinine 1.09, BUN 14, Potassium 4.0, Sodium 137, GFR >60 09/02/2020 Creatinine 1.03, BUN 12, Potassium 3.9, Sodium 139, GFR >60 07/19/2020 Creatinine1.24, BUN15, Potassium4.7, Sodium142 05/10/2020 Creatinine1.27, BUN12, Potassium4.1, Sodium136, GFR>60  05/05/2020 Creatinine1.35, BUN20, Potassium4.7, Sodium139, OMA00-45  04/17/2020 Creatinine1.41, BUN15, Potassium4.0, Sodium137, GFR55->60 A complete set of results can be found in Results Review.  Recommendations:Advised to limit salt and fluid intake.  Encouraged to call for changes in condition.   Follow-up plan: ICM clinic phone appointment on 12/27/2020. 91 day device clinic remote transmission6/05/2021.   EP/Cardiology Office Visits:[facility].   Copy of ICM check sent to Dr.[name].  3 month ICM trend: 11/25/2020.    1 Year ICM trend:       [name], RN 11/26/2020 4:11 PM

## 2020-11-27 LAB — CUP PACEART REMOTE DEVICE CHECK
Battery Remaining Longevity: 10 mo
Battery Voltage: 2.87 V
Brady Statistic AP VP Percent: 1.82 %
Brady Statistic AP VS Percent: 11.05 %
Brady Statistic AS VP Percent: 85.74 %
Brady Statistic AS VS Percent: 1.39 %
Brady Statistic RA Percent Paced: 12.71 %
Brady Statistic RV Percent Paced: 86.88 %
Date Time Interrogation Session: 20220310001803
HighPow Impedance: 68 Ohm
Implantable Lead Implant Date: 20160727
Implantable Lead Implant Date: 20160727
Implantable Lead Implant Date: 20160727
Implantable Lead Location: 753858
Implantable Lead Location: 753859
Implantable Lead Location: 753860
Implantable Lead Model: 4598
Implantable Lead Model: 5076
Implantable Lead Model: 6935
Implantable Pulse Generator Implant Date: 20160727
Lead Channel Impedance Value: 361 Ohm
Lead Channel Impedance Value: 361 Ohm
Lead Channel Impedance Value: 399 Ohm
Lead Channel Impedance Value: 399 Ohm
Lead Channel Impedance Value: 399 Ohm
Lead Channel Impedance Value: 418 Ohm
Lead Channel Impedance Value: 418 Ohm
Lead Channel Impedance Value: 456 Ohm
Lead Channel Impedance Value: 589 Ohm
Lead Channel Impedance Value: 608 Ohm
Lead Channel Impedance Value: 646 Ohm
Lead Channel Impedance Value: 646 Ohm
Lead Channel Impedance Value: 703 Ohm
Lead Channel Pacing Threshold Amplitude: 0.375 V
Lead Channel Pacing Threshold Amplitude: 0.625 V
Lead Channel Pacing Threshold Amplitude: 1.625 V
Lead Channel Pacing Threshold Pulse Width: 0.4 ms
Lead Channel Pacing Threshold Pulse Width: 0.4 ms
Lead Channel Pacing Threshold Pulse Width: 0.6 ms
Lead Channel Sensing Intrinsic Amplitude: 2.375 mV
Lead Channel Sensing Intrinsic Amplitude: 2.375 mV
Lead Channel Sensing Intrinsic Amplitude: 8.875 mV
Lead Channel Sensing Intrinsic Amplitude: 8.875 mV
Lead Channel Setting Pacing Amplitude: 0.5 V
Lead Channel Setting Pacing Amplitude: 1.5 V
Lead Channel Setting Pacing Amplitude: 3 V
Lead Channel Setting Pacing Pulse Width: 0.03 ms
Lead Channel Setting Pacing Pulse Width: 0.6 ms
Lead Channel Setting Sensing Sensitivity: 0.3 mV

## 2020-11-29 ENCOUNTER — Other Ambulatory Visit (HOSPITAL_COMMUNITY): Payer: Self-pay | Admitting: Cardiology

## 2020-12-01 NOTE — Telephone Encounter (Signed)
Patient is scheduled for lab study on 01/13/21. Patient understands his sleep study will be done at [facility] sleep lab. Patient understands he will receive a sleep packet in a week or so. Patient understands to call if he does not receive the sleep packet in a timely manner. Patient agrees with treatment and thanked me for call.

## 2020-12-02 ENCOUNTER — Telehealth (HOSPITAL_COMMUNITY): Payer: Self-pay

## 2020-12-02 NOTE — Telephone Encounter (Signed)
Patient is now discharged from [facility].  Patient has/has not met the following goals:  Yes :Patient expresses basic understanding of medications and what they are for Yes :Patient able to verbalize heart failure specific dietary/fluid restrictions Yes :Patient is aware of who to call if they have medical concerns or if they need to schedule or change appts Yes :Patient has a scale for daily weights and weighs regularly Yes :Patient able to verbalize concerning symptoms when they should call the HF clinic (weight gain ranges, etc) Yes :Patient has a PCP and has seen within the past year or has upcoming appt Yes :Patient has reliable access to getting their medications Yes :Patient has shown they are able to reorder medications reliably No :Patient has had admission in past 30 days- if yes how many? No :Patient has had admission in past 90 days- if yes how many?  Discharge Comments: I called Mr Marinaccio to discuss graduation from [employer]. He stated she he has been managing well on his own and is able to afford all his medications at this time. He understands his medications and disease process and feels comfortable with moving forward without to safety net of the [employer]. He was advised that he should reach out to the HF clinic moving forward if he should have issues.   Jacquiline Doe, EMT 12/02/20

## 2020-12-03 NOTE — Progress Notes (Signed)
Remote ICD transmission.   

## 2020-12-24 ENCOUNTER — Telehealth: Payer: Self-pay
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
## 2020-12-28 ENCOUNTER — Inpatient Hospital Stay (HOSPITAL_COMMUNITY): Admission: RE | Admit: 2020-12-28 | Payer: Medicare HMO | Source: Ambulatory Visit | Admitting: Cardiology

## 2020-12-31 NOTE — Telephone Encounter (Signed)
Call to [employer].  Ordered replacement monitor for patient.  Rep advised monitors are currently on back order for 2 months but will ship as soon as available.  Advised to send temporary address of [address], [place].

## 2020-12-31 NOTE — Telephone Encounter (Signed)
ICM Remote Transmission Rescheduled for 02/28/2021 to allow time for patient to receive replacement monitor.  Pt will call when he receives new monitor.
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
## 2021-01-13 ENCOUNTER — Ambulatory Visit (HOSPITAL_BASED_OUTPATIENT_CLINIC_OR_DEPARTMENT_OTHER): Payer: Medicare HMO | Attending: Cardiology | Admitting: Cardiology

## 2021-01-13 ENCOUNTER — Other Ambulatory Visit: Payer: Self-pay

## 2021-01-13 DIAGNOSIS — G4733 Obstructive sleep apnea (adult) (pediatric): Secondary | ICD-10-CM

## 2021-01-23 NOTE — Procedures (Signed)
   Patient Name: [name] [name], [name] [name] Date: 01/13/2021 Gender: Male D.O.B: 1961-05-03 Age (years): 60 Referring Provider: Loralie Champagne Height (inches): 76 Interpreting Physician: Fransico Him MD, ABSM Weight (lbs): 329 RPSGT: Laren Everts BMI: 40 MRN: [id number] Neck Size: 20.00  CLINICAL INFORMATION The patient is referred for a split night study with BPAP.  MEDICATIONS Medications self-administered by patient taken the night of the study : N/A  SLEEP STUDY TECHNIQUE As per the AASM Manual for the Scoring of Sleep and Associated Events v2.3 (April 2016) with a hypopnea requiring 4% desaturations.  The channels recorded and monitored were frontal, central and occipital EEG, electrooculogram (EOG), submentalis EMG (chin), nasal and oral airflow, thoracic and abdominal wall motion, anterior tibialis EMG, snore microphone, electrocardiogram, and pulse oximetry. Bi-level positive airway pressure (BiPAP) was initiated when the patient met split night criteria and was titrated according to treat sleep-disordered breathing.  RESPIRATORY PARAMETERS Diagnostic Total AHI (/hr): 99.8  RDI (/hr):99.8  OA Index (/hr): 52.1  CA Index (/hr): 0.0 REM AHI (/hr): N/A  NREM AHI (/hr):99.8  Supine AHI (/hr):135.0 Non-supine AHI (/hr):96.00 Min O2 Sat (%):68.00  Mean O2 (%): 85.89  Time below 88% (min):71.9   Titration Optimal IPAP Pressure (cm): 23  Optimal EPAP Pressure (cm):19  AHI at Optimal Pressure (/hr):2.4  Min O2 at Optimal Pressure (%):85.0 Sleep % at Optimal (%):76  Supine % at Optimal (%):9   SLEEP ARCHITECTURE The study was initiated at 10:07:17 PM and terminated at 4:42:12 AM. The total recorded time was 394.9 minutes. EEG confirmed total sleep time was 304 minutes yielding a sleep efficiency of 77.0%. Sleep onset after lights out was 7.2 minutes with a REM latency of 230.5 minutes. The patient spent 25.99% of the night in stage N1 sleep, 70.07% in stage N2 sleep, 0.00% in  stage N3 and 4% in REM. Wake after sleep onset (WASO) was 83.7 minutes. The Arousal Index was 86.4/hour.  LEG MOVEMENT DATA The total Periodic Limb Movements of Sleep (PLMS) were 8. The PLMS index was 1.58 .  CARDIAC DATA The 2 lead EKG demonstrated sinus rhythm. The mean heart rate was 52.61 beats per minute. Other EKG findings include: PVCs.  IMPRESSIONS - Severe obstructive sleep apnea occurred during the diagnostic portion of the study (AHI = 99.8 /hour). An optimal PAP pressure could not be selected for this patient due to ongoing respiratory events.  - No significant central sleep apnea occurred during the diagnostic portion of the study (CAI = 0.0/hour). - Severe oxygen desaturation was noted during the diagnostic portion of the study (Min O2 = 68.00%). - The patient snored with moderate snoring volume during the diagnostic portion of the study. - EKG findings include PVCs. - Clinically significant periodic limb movements of sleep did not occur during the study.  DIAGNOSIS - Obstructive Sleep Apnea (G47.33) - Nocturnal Hypoxemia - Unsuccessful CPAP titration due to ongoing respiratory events.   RECOMMENDATIONS - Recommend in lab BiPAP titration. - Avoid alcohol, sedatives and other CNS depressants that may worsen sleep apnea and disrupt normal sleep architecture. - Sleep hygiene should be reviewed to assess factors that may improve sleep quality. - Weight management and regular exercise should be initiated or continued.  [Electronically signed] 01/23/2021 12:33 AM  Fransico Him MD, ABSM Diplomate, American Board of Sleep Medicine

## 2021-01-24 ENCOUNTER — Telehealth: Payer: Self-pay | Admitting: *Deleted

## 2021-01-24 DIAGNOSIS — G4733 Obstructive sleep apnea (adult) (pediatric): Secondary | ICD-10-CM

## 2021-01-24 NOTE — Telephone Encounter (Signed)
-----   Message from [name], MD sent at 01/23/2021 12:39 AM EDT ----- Please let patient know that they have sleep apnea but unsuccessful PAP titration.  Please order repeat in lab all night BiPAP study

## 2021-01-24 NOTE — Telephone Encounter (Signed)
The patient has been notified of the result and verbalized understanding.  All questions (if any) were answered. Patient understands his titration study showed they have sleep apnea but unsuccessful PAP titration. Please order repeat in lab all night BiPAP study   Left detailed message on voicemail and informed patient to call back with questions  Bipap sent to sleep pool

## 2021-02-02 ENCOUNTER — Ambulatory Visit (INDEPENDENT_AMBULATORY_CARE_PROVIDER_SITE_OTHER): Payer: Medicare HMO | Admitting: Internal Medicine

## 2021-02-02 ENCOUNTER — Other Ambulatory Visit: Payer: Self-pay

## 2021-02-02 ENCOUNTER — Other Ambulatory Visit (HOSPITAL_COMMUNITY)
Admission: RE | Admit: 2021-02-02 | Discharge: 2021-02-02 | Disposition: A | Discharge status: Home / Self Care | Payer: Medicare HMO | Source: Ambulatory Visit | Attending: Internal Medicine | Admitting: Internal Medicine

## 2021-02-02 ENCOUNTER — Encounter: Payer: Self-pay | Admitting: Internal Medicine

## 2021-02-02 VITALS — BP 95/61 | HR 58 | Resp 24 | Wt 324.0 lb

## 2021-02-02 DIAGNOSIS — R300 Dysuria: Secondary | ICD-10-CM | POA: Insufficient documentation

## 2021-02-02 DIAGNOSIS — R81 Glycosuria: Secondary | ICD-10-CM

## 2021-02-02 LAB — POCT URINALYSIS DIP (CLINITEK)
Bilirubin, UA: NEGATIVE
Glucose, UA: 500 mg/dL — AB
Ketones, POC UA: NEGATIVE mg/dL
Nitrite, UA: POSITIVE — AB
POC PROTEIN,UA: NEGATIVE
Spec Grav, UA: 1.01 (ref 1.010–1.025)
Urobilinogen, UA: 0.2 E.U./dL
pH, UA: 5 (ref 5.0–8.0)

## 2021-02-02 MED ORDER — CEPHALEXIN 500 MG PO CAPS: 500.0000 mg | ORAL_CAPSULE | Freq: Two times a day (BID) | ORAL | 0 refills | Status: DC

## 2021-02-02 NOTE — Progress Notes (Signed)
Subjective:    [name] [name] - 60 y.o. male MRN [id number]  Date of birth: 1961/05/10  HPI  [name] [name] is here for dysuria has been occurring x1 month. Burning occurs at the end of the urination. No hematuria. Increased urinary frequency. Has UTI x1 a few years ago and this feels similar. Has never seen a urologist. Afebrile. No nausea/vomiting. No flank pain.   Health Maintenance:  Health Maintenance Due  Topic Date Due  . COVID-19 Vaccine (1) Never done  . Hepatitis C Screening  Never done  . TETANUS/TDAP  Never done  . COLONOSCOPY (Pts 45-60yrs Insurance coverage will need to be confirmed)  Never done    -  reports that he quit smoking about 15 years ago. His smoking use included cigarettes. He has a 2.00 pack-year smoking history. He has never used smokeless tobacco. - Review of Systems: Per HPI. - Past Medical History: Patient Active Problem List   Diagnosis Date Noted  . Primary osteoarthritis of left knee 07/30/2020  . Status post total left knee replacement 07/30/2020  . Status post total knee replacement, left 07/30/2020  . Ventricular tachycardia (HCC) 04/17/2020  . Primary osteoarthritis of right knee 01/30/2020  . Hypokalemia 11/23/2019  . Primary osteoarthritis of right hip 11/03/2018  . VT (ventricular tachycardia) (HCC) 05/30/2017  . AKI (acute kidney injury) (HCC)   . Obesity 12/25/2016  . OSA (obstructive sleep apnea) 12/25/2016  . Hyperglycemia 12/25/2016  . Acute respiratory failure with hypoxia (HCC) 12/25/2016  . Chronic anticoagulation 06/27/2016  . Encounter for therapeutic drug monitoring 04/19/2015  . S/P ICD (internal cardiac defibrillator) procedure Medtronic CRT ICD 04/15/2015  . Atrial fibrillation with RVR - Paroxysmal 04/08/2015  . NICM 04/06/2015  . Troponin level elevated 04/06/2015  . Rectal bleeding 10/26/2011  . Hydrocele, left 05/24/2011  . Tobacco use disorder- 1/2 pk a day   . Alcohol abuse   . Obesity 09/27/2008  . CAD- minor CAD  2009 and 2016 09/27/2008  . VENTRICULAR TACHYCARDIA 09/27/2008  . Essential hypertension 06/19/2008  . Chronic systolic heart failure (HCC) 06/19/2008   - Medications: reviewed and updated   Objective:   Physical Exam BP 95/61   Pulse (!) 58   Resp (!) 24   Wt (!) 324 lb (147 kg)   SpO2 97%   BMI 39.44 kg/m  Physical Exam Constitutional:      General: He is not in acute distress.    Appearance: He is not diaphoretic.  Cardiovascular:     Rate and Rhythm: Normal rate.  Pulmonary:     Effort: Pulmonary effort is normal. No respiratory distress.  Musculoskeletal:        General: Normal range of motion.  Skin:    General: Skin is warm and dry.  Neurological:     Mental Status: He is alert and oriented to person, place, and time.  Psychiatric:        Mood and Affect: Affect normal.        Judgment: Judgment normal.            Assessment & Plan:   1. Dysuria Dipstick is concerning for infection with leukocytes and nitrites. Will send for culture and start empiric treatment with Keflex. Will also send for micro given blood reported on dipstick. Obtain GC/Chlamydia as well.  - POCT URINALYSIS DIP (CLINITEK) - Urine Culture - GC/Chlamydia Probe Amp - Urinalysis, Routine w reflex microscopic - cephALEXin (KEFLEX) 500 MG capsule; Take 1 capsule (500 mg total) by mouth  2 (two) times daily.  Dispense: 14 capsule; Refill: 0  2. Glucosuria Noted that he had 500 glucose on urine with no prior history of DM. This could also be etiology of his symptoms. Will obtain A1c.  - Hemoglobin A1c    Marcy Siren, D.O. 02/02/2021, 10:44 AM Primary Care at [facility]

## 2021-02-02 NOTE — Progress Notes (Signed)
C/o dysuria x 1 month 

## 2021-02-03 LAB — URINE CYTOLOGY ANCILLARY ONLY
Chlamydia: NEGATIVE
Comment: NEGATIVE
Comment: NEGATIVE
Comment: NORMAL
Neisseria Gonorrhea: NEGATIVE
Trichomonas: NEGATIVE

## 2021-02-04 LAB — HEMOGLOBIN A1C
Est. average glucose Bld gHb Est-mCnc: 120 mg/dL
Hgb A1c MFr Bld: 5.8 % — ABNORMAL HIGH (ref 4.8–5.6)

## 2021-02-04 LAB — SPECIMEN STATUS REPORT

## 2021-02-07 ENCOUNTER — Encounter (HOSPITAL_COMMUNITY): Payer: Medicaid Other

## 2021-02-08 ENCOUNTER — Encounter (HOSPITAL_COMMUNITY): Payer: Self-pay

## 2021-02-08 ENCOUNTER — Telehealth (HOSPITAL_COMMUNITY): Payer: Self-pay | Admitting: Cardiology

## 2021-02-08 ENCOUNTER — Other Ambulatory Visit: Payer: Self-pay

## 2021-02-08 ENCOUNTER — Ambulatory Visit (HOSPITAL_COMMUNITY)
Admission: RE | Admit: 2021-02-08 | Discharge: 2021-02-08 | Disposition: A | Discharge status: Home / Self Care | Payer: Medicare HMO | Source: Ambulatory Visit | Attending: Cardiology | Admitting: Cardiology

## 2021-02-08 VITALS — BP 92/54 | HR 65 | Ht 76.0 in | Wt 329.4 lb

## 2021-02-08 DIAGNOSIS — Z8249 Family history of ischemic heart disease and other diseases of the circulatory system: Secondary | ICD-10-CM | POA: Diagnosis not present

## 2021-02-08 DIAGNOSIS — I480 Paroxysmal atrial fibrillation: Secondary | ICD-10-CM | POA: Insufficient documentation

## 2021-02-08 DIAGNOSIS — Z6841 Body Mass Index (BMI) 40.0 and over, adult: Secondary | ICD-10-CM | POA: Diagnosis not present

## 2021-02-08 DIAGNOSIS — F1010 Alcohol abuse, uncomplicated: Secondary | ICD-10-CM | POA: Diagnosis not present

## 2021-02-08 DIAGNOSIS — I110 Hypertensive heart disease with heart failure: Secondary | ICD-10-CM | POA: Diagnosis not present

## 2021-02-08 DIAGNOSIS — I5022 Chronic systolic (congestive) heart failure: Secondary | ICD-10-CM

## 2021-02-08 DIAGNOSIS — Z87891 Personal history of nicotine dependence: Secondary | ICD-10-CM | POA: Diagnosis not present

## 2021-02-08 DIAGNOSIS — Z79899 Other long term (current) drug therapy: Secondary | ICD-10-CM | POA: Insufficient documentation

## 2021-02-08 DIAGNOSIS — J449 Chronic obstructive pulmonary disease, unspecified: Secondary | ICD-10-CM | POA: Diagnosis not present

## 2021-02-08 DIAGNOSIS — Z7984 Long term (current) use of oral hypoglycemic drugs: Secondary | ICD-10-CM | POA: Insufficient documentation

## 2021-02-08 DIAGNOSIS — I428 Other cardiomyopathies: Secondary | ICD-10-CM | POA: Diagnosis not present

## 2021-02-08 DIAGNOSIS — G4733 Obstructive sleep apnea (adult) (pediatric): Secondary | ICD-10-CM | POA: Diagnosis not present

## 2021-02-08 DIAGNOSIS — Z9989 Dependence on other enabling machines and devices: Secondary | ICD-10-CM | POA: Insufficient documentation

## 2021-02-08 DIAGNOSIS — Z7901 Long term (current) use of anticoagulants: Secondary | ICD-10-CM | POA: Diagnosis not present

## 2021-02-08 DIAGNOSIS — Z95810 Presence of automatic (implantable) cardiac defibrillator: Secondary | ICD-10-CM | POA: Diagnosis not present

## 2021-02-08 DIAGNOSIS — Z87440 Personal history of urinary (tract) infections: Secondary | ICD-10-CM | POA: Diagnosis not present

## 2021-02-08 LAB — COMPREHENSIVE METABOLIC PANEL
ALT: 23 U/L (ref 0–44)
AST: 19 U/L (ref 15–41)
Albumin: 3.6 g/dL (ref 3.5–5.0)
Alkaline Phosphatase: 83 U/L (ref 38–126)
Anion gap: 4 — ABNORMAL LOW (ref 5–15)
BUN: 31 mg/dL — ABNORMAL HIGH (ref 6–20)
CO2: 30 mmol/L (ref 22–32)
Calcium: 9.2 mg/dL (ref 8.9–10.3)
Chloride: 102 mmol/L (ref 98–111)
Creatinine, Ser: 1.72 mg/dL — ABNORMAL HIGH (ref 0.61–1.24)
GFR, Estimated: 46 mL/min — ABNORMAL LOW (ref >60)
Glucose, Bld: 121 mg/dL — ABNORMAL HIGH (ref 70–99)
Potassium: 5.6 mmol/L — ABNORMAL HIGH (ref 3.5–5.1)
Sodium: 136 mmol/L (ref 135–145)
Total Bilirubin: 1.2 mg/dL (ref 0.3–1.2)
Total Protein: 6.2 g/dL — ABNORMAL LOW (ref 6.5–8.1)

## 2021-02-08 LAB — CBC
HCT: 34.7 % — ABNORMAL LOW (ref 39.0–52.0)
Hemoglobin: 11.5 g/dL — ABNORMAL LOW (ref 13.0–17.0)
MCH: 33.3 pg (ref 26.0–34.0)
MCHC: 33.1 g/dL (ref 30.0–36.0)
MCV: 100.6 fL — ABNORMAL HIGH (ref 80.0–100.0)
Platelets: 188 10*3/uL (ref 150–400)
RBC: 3.45 MIL/uL — ABNORMAL LOW (ref 4.22–5.81)
RDW: 14 % (ref 11.5–15.5)
WBC: 4.5 10*3/uL (ref 4.0–10.5)
nRBC: 0 % (ref 0.0–0.2)

## 2021-02-08 LAB — MAGNESIUM: Magnesium: 2.3 mg/dL (ref 1.7–2.4)

## 2021-02-08 LAB — TSH: TSH: 0.727 u[IU]/mL (ref 0.350–4.500)

## 2021-02-08 MED ORDER — SPIRONOLACTONE 25 MG PO TABS: 12.5000 mg | ORAL_TABLET | Freq: Every day | ORAL | 3 refills | Status: DC

## 2021-02-08 MED ORDER — POTASSIUM CHLORIDE CRYS ER 20 MEQ PO TBCR: 40.0000 meq | EXTENDED_RELEASE_TABLET | Freq: Every day | ORAL | 3 refills | Status: DC

## 2021-02-08 NOTE — Telephone Encounter (Signed)
Pt aware and voiced understanding Repeat lab 5/27

## 2021-02-08 NOTE — Progress Notes (Signed)
[facility] Note   Primary Care: Dr. Windle Guard, MD HF Cardiology: Dr. Shirlee Latch  HPI: [name] [name] is a 60 y.o. male with a past medical history of chronic systolic CHF  (EF 15% in April 2018), dilated NICM s/p BiV ICD, PAF with intermittent compliance with Xarelto, ETOH abuse, tobacco abuse, OSA on CPAP, and HTN.   Admitted 12/25/16-12/27/16 with acute on chronic systolic CHF, volume overload. He was diuresed 11 pounds on IV lasix.  Discharge weight was 294 pounds. It was felt that the etiology of his NICM was ETOH vs hypertension related.   Admitted 5/14-5/16/18 with volume overload, acute respiratory failure requiring Bipap. He was not taking his Entresto as he was confused about dosing and there was a mix up his pharmacy. He was diuresed with IV lasix, discharge weight was 295 pounds.   Admitted 05/30/17-06/02/17 with VT, ICD shock x 4. He is now on amiodarone and sotalol.  Echo in 12/20 showed EF 20-25%, severe LV dilation, moderate LVH, normal RV, small IVC.   He was admitted in 3/21 with ICD shock in setting of hypokalemia and heavy ETOH. Entresto was decreased with low BP.  He was diuresed due to volume overload.   He had left TKR in 11/21 with no complication.   Echo in 12/21 showed EF 20-25%, moderate LVH, normal RV, normal IVC.   Last seen by Dr. Shirlee Latch 2/22. Was doing fairly well symptomatically but was mildly fluid overloaded and torsemide was increased to 60 mg bid x 2 days then back to 60 qam/40 qpm.  He presents back to clinic today for 3 month f/u. Doing well. No complaints. NYHA Class II. No resting dyspnea, orthopnea, PND or LEE. Denies CP and palpitations. Wt is stable/ unchanged since his last visit at 329 lb. Impedence stable on device interrogation and index is < threshold. No AT/AF. No VT/VF. Activity level ~3h/day. BP is soft, 92/54. He denies orthostatic symptoms. Still drinks beer ~2-3/day. Compliant w/ meds including Xarelto. Denies abnormal bleeding.  Waiting on BiPAP device.   He also reports seeing his PCP last week for dysuria and placed on Keflex for UTI. Symptoms improving. He is on Jardiance but this is his 1st UTI in recent years.  Hgb A1c on 02/02/21 was 5.8     ECG (personally reviewed): A- sensed V-paced, QT/QTc 456/492 ms   Labs (9/18): K 3.8, creatinine 1.02 Labs (11/18): K 4.4, creatinine 1.19, TSH normal, LFTs normal Labs (11/05/18): K 4 Creatinine 1.05  Labs (12/20): K 4, creatinine 1.19 Labs (3/21): K 4.1, creatinine 1.05, hgb 10.8, LFTs normal, TSH normal Labs (11/21): K 4.7, creatinine 1.24, hgb 11.9 Labs (12/21): K 4, creatinine 1.05, TSH and LFTs normal Labs (2/22):   Scr 1.29, K 4.8, TSH 1.15, ALT 20, AST 16   Labs (5/22): Hgb A1c 5.8   Medtronic device interrogation:  Impedence stable and index is < threshold. No AT/AF. No VT/VF. Activity level ~3h/day.  PMH: 1. Chronic systolic CHF: Medtronic CRT-D.  Nonischemic cardiomyopathy. Possibly ETOH-related.  - LHC (2016): No CAD.  - Echo (2016): EF 20-25%, diffuse hypokinesis.  - Echo (4/18): EF 15%, normal RV size and systolic function.  - Echo (11/18): EF 20%, mildly dilated LV with mild LVH, normal RV size with mildly decreased RV systolic function.  - Echo (12/20): EF 20-25%, severe LV dilation, moderate LVH, normal RV, small IVC.  - Echo (12/21): EF 20-25%, moderate LVH, normal RV, normal IVC. 2. Atrial fibrillation: Paroxysmal.   3. OSA:  On CPAP.  4. ETOH abuse: Prior heavy ETOH.  5. H/o VT 6. COPD: Prior smoker.   ROS: All systems reviewed and negative except as per HPI.   Current Outpatient Medications  Medication Sig Dispense Refill  . albuterol (VENTOLIN HFA) 108 (90 Base) MCG/ACT inhaler Inhale 1 puff into the lungs every 6 (six) hours as needed for wheezing or shortness of breath. 18 g 3  . amiodarone (PACERONE) 200 MG tablet Take 1 tablet (200 mg total) by mouth daily. 180 tablet 3  . Ascorbic Acid (VITAMIN C) 100 MG tablet Take 100 mg by mouth  daily.    Marland Kitchen atorvastatin (LIPITOR) 80 MG tablet Take 1 tablet (80 mg total) by mouth every evening. 90 tablet 3  . cephALEXin (KEFLEX) 500 MG capsule Take 1 capsule (500 mg total) by mouth 2 (two) times daily. 14 capsule 0  . docusate sodium (COLACE) 100 MG capsule Take 1 capsule (100 mg total) by mouth 2 (two) times daily as needed (for constipation). 60 capsule 0  . empagliflozin (JARDIANCE) 10 MG TABS tablet Take 1 tablet (10 mg total) by mouth daily. 30 tablet 11  . potassium chloride SA (KLOR-CON) 20 MEQ tablet Take 2 tablets (40 mEq total) by mouth 2 (two) times daily. 360 tablet 3  . sacubitril-valsartan (ENTRESTO) 49-51 MG Take 1 tablet by mouth 2 (two) times daily. 180 tablet 3  . sotalol (BETAPACE) 80 MG tablet Take 1 tablet (80 mg total) by mouth every 12 (twelve) hours. 180 tablet 3  . spironolactone (ALDACTONE) 25 MG tablet Take 1 tablet (25 mg total) by mouth daily. 90 tablet 3  . torsemide (DEMADEX) 20 MG tablet Take 3 tablets (60 mg total) by mouth every morning AND 2 tablets (40 mg total) every evening. 450 tablet 3  . XARELTO 20 MG TABS tablet TAKE 1 TABLET DAILY WITH SUPPER. 90 tablet 3   No current facility-administered medications for this encounter.    No Known Allergies    Social History   Socioeconomic History  . Marital status: Single    Spouse name: Not on file  . Number of children: 2  . Years of education: Not on file  . Highest education level: Not on file  Occupational History  . Occupation: Copy  . Occupation: disabled, odd jobs  Tobacco Use  . Smoking status: Former Smoker    Packs/day: 0.50    Years: 4.00    Pack years: 2.00    Types: Cigarettes    Quit date: 10/29/2005    Years since quitting: 15.2  . Smokeless tobacco: Never Used  . Tobacco comment: about a pack a week. social  Vaping Use  . Vaping Use: Never used  Substance and Sexual Activity  . Alcohol use: Yes    Comment: 12 beers per week   . Drug use: Not Currently    Types:  Marijuana  . Sexual activity: Yes    Partners: Female  Other Topics Concern  . Not on file  Social History Narrative   ** Merged History Encounter **       Social Determinants of Health   Financial Resource Strain: Not on file  Food Insecurity: Not on file  Transportation Needs: Not on file  Physical Activity: Not on file  Stress: Not on file  Social Connections: Not on file  Intimate Partner Violence: Not on file      Family History  Problem Relation Age of Onset  . Other Mother  cardiac surgery. late 1990s  . Heart Problems Mother        CABG AGE 18  . Congestive Heart Failure Father   . Healthy Father        AGE 78  . Coronary artery disease Other   . Healthy Brother        AGE 26  . Healthy Sister        AGE 53  . Healthy Sister        AGE 45  . Healthy Son   . Healthy Son   . Healthy Daughter     Vitals:   02/08/21 0900  BP: (!) 92/54  Pulse: 65  SpO2: 95%  Weight: (!) 149.4 kg (329 lb 6.4 oz)  Height: 6\' 4"  (1.93 m)   Wt Readings from Last 3 Encounters:  02/08/21 (!) 149.4 kg (329 lb 6.4 oz)  02/02/21 (!) 147 kg (324 lb)  01/13/21 (!) 149.2 kg (329 lb)     PHYSICAL EXAM: General:  Well appearing, obese. No respiratory difficulty HEENT: normal Neck: supple. no JVD. Carotids 2+ bilat; no bruits. No lymphadenopathy or thyromegaly appreciated. Cor: PMI nondisplaced. Regular rate & rhythm. No rubs, gallops or murmurs. Lungs: clear Abdomen: soft, nontender, nondistended. No hepatosplenomegaly. No bruits or masses. Good bowel sounds. Extremities: no cyanosis, clubbing, rash, edema Neuro: alert & oriented x 3, cranial nerves grossly intact. moves all 4 extremities w/o difficulty. Affect pleasant.   ASSESSMENT & PLAN: 1. Chronic systolic CHF: NICM, Echo 12/2016 EF 15%. Normal coronaries on cath in July 2016. Medtronic CRT-D device. ETOH vs HTN vs myocarditis as cause of cardiomyopathy. Echo 12/21 showed that EF remains 20-25%.  NYHA class II  symptoms.  Weight stable, not volume overloaded on exam. Impedence/fluid index on Optivol c/w euvolemia.  - Continue torsemide 60 qam/40 qpm.      - Continue Entresto 49/51 bid. BP too soft for titration  - Continue spironolactone 25 mg daily - Intolerant to Coreg in the past with hypotension, he is on sotalol and amiodarone.  - Continue empagliflozin 10 mg daily. Advised to alert 1/22 if recurrent UTIs  - He needs to cut back on ETOH as he is still drinking ~3 beers daily. Discussed importance of reducing intake  - Check BMP today  2. Paroxysmal atrial fibrillation: He is in NSR today.  - Continue amiodarone 200 mg daily.  Check LFTs/TSH today.  Needs annual eye exam.  - Continue Xarelto 20 mg daily.  Denies abnormal bleeding. Check CBC today  3. ETOH abuse: Suspect this contributes to cardiomyopathy.  Still drinking 2-3 beers/day.  I strongly encouraged him to quit.   4. Tobacco abuse: No longer smoking.   5. VT: Followed closely by EP, he is now on amiodarone and sotalol (QTc ok at 492 msec ). No VT/VF on device interrogation - check Mg and K level today  6. OSA: followed by Dr. [place]. Awaiting BiPAP titration   F/u w/ Dr. Mayford Knife in 3-4 months   Shirlee Latch, PA-C 02/08/21

## 2021-02-08 NOTE — Patient Instructions (Signed)
It was great to see you today! No medication changes are needed at this time.  Labs today We will only contact you if something comes back abnormal or we need to make some changes. Otherwise no news is good news!  Keep follow up as scheduled with Dr Shirlee Latch  Do the following things EVERYDAY: 1) Weigh yourself in the morning before breakfast. Write it down and keep it in a log. 2) Take your medicines as prescribed 3) Eat low salt foods--Limit salt (sodium) to 2000 mg per day.  4) Stay as active as you can everyday 5) Limit all fluids for the day to less than 2 liters  At the [facility], you and your health needs are our priority. As part of our continuing mission to provide you with exceptional heart care, we have created designated Provider Care Teams. These Care Teams include your primary Cardiologist (physician) and Advanced Practice Providers (APPs- Physician Assistants and Nurse Practitioners) who all work together to provide you with the care you need, when you need it.   You may see any of the following providers on your designated Care Team at your next follow up: Marland Kitchen Dr Arvilla Meres . Dr Marca Ancona . Dr Thornell Mule . Tonye Becket, NP . Robbie Lis, PA . Shanda Bumps [place],NP . Karle Plumber, PharmD   Please be sure to bring in all your medications bottles to every appointment.   If you have any questions or concerns before your next appointment please send [place] a message through [place] or call our office at [phone].    TO LEAVE A MESSAGE FOR THE NURSE SELECT OPTION 2, PLEASE LEAVE A MESSAGE INCLUDING: . YOUR NAME . DATE OF BIRTH . CALL BACK NUMBER . REASON FOR CALL**this is important as we prioritize the call backs  YOU WILL RECEIVE A CALL BACK THE SAME DAY AS LONG AS YOU CALL BEFORE 4:00 PM

## 2021-02-09 LAB — URINALYSIS, ROUTINE W REFLEX MICROSCOPIC

## 2021-02-09 LAB — URINE CULTURE

## 2021-02-11 ENCOUNTER — Other Ambulatory Visit: Payer: Self-pay

## 2021-02-11 ENCOUNTER — Ambulatory Visit (HOSPITAL_COMMUNITY)
Admission: RE | Admit: 2021-02-11 | Discharge: 2021-02-11 | Disposition: A | Discharge status: Home / Self Care | Payer: Medicare HMO | Source: Ambulatory Visit | Attending: Cardiology | Admitting: Cardiology

## 2021-02-11 DIAGNOSIS — I5022 Chronic systolic (congestive) heart failure: Secondary | ICD-10-CM | POA: Insufficient documentation

## 2021-02-11 LAB — BASIC METABOLIC PANEL
Anion gap: 5 (ref 5–15)
BUN: 26 mg/dL — ABNORMAL HIGH (ref 6–20)
CO2: 30 mmol/L (ref 22–32)
Calcium: 9.5 mg/dL (ref 8.9–10.3)
Chloride: 104 mmol/L (ref 98–111)
Creatinine, Ser: 1.67 mg/dL — ABNORMAL HIGH (ref 0.61–1.24)
GFR, Estimated: 47 mL/min — ABNORMAL LOW (ref >60)
Glucose, Bld: 109 mg/dL — ABNORMAL HIGH (ref 70–99)
Potassium: 4.8 mmol/L (ref 3.5–5.1)
Sodium: 139 mmol/L (ref 135–145)

## 2021-02-15 ENCOUNTER — Other Ambulatory Visit: Payer: Self-pay | Admitting: Internal Medicine

## 2021-02-15 MED ORDER — SULFAMETHOXAZOLE-TRIMETHOPRIM 800-160 MG PO TABS: 1.0000 | ORAL_TABLET | Freq: Two times a day (BID) | ORAL | 0 refills | Status: DC

## 2021-02-16 NOTE — Telephone Encounter (Addendum)
Prior Authorization for Bipap titration sent to [facility] via web portal. Tracking Number [id number].

## 2021-02-16 NOTE — Telephone Encounter (Signed)
RE: precert Received: 2 weeks ago Gaynelle Cage, CMA  Reesa Chew, CMA This is a Environmental education officer for you to do :)    From: Reesa Chew, CMA  Sent: 01/24/2021  6:02 PM EDT  To: [facility] Sleep Studies  Subject: precert                      repeat in lab all night BiPAP study

## 2021-03-01 ENCOUNTER — Telehealth: Payer: Self-pay

## 2021-03-01 NOTE — Telephone Encounter (Signed)
Called pt regarding sleep study. I was not able to reach pt or leave a voicemail.

## 2021-03-03 ENCOUNTER — Telehealth (HOSPITAL_COMMUNITY): Payer: Self-pay | Admitting: *Deleted

## 2021-03-03 NOTE — Telephone Encounter (Signed)
Pt called inquiring about cpap machine. I gave pt the number to [facility] to call for nina or Dr.Turner

## 2021-03-03 NOTE — Telephone Encounter (Signed)
Informed patient of sleep study results and patient understanding was verbalized. Patient understands his sleep study showed they have sleep apnea and recommend BiPAP titration. Please set up titration in the sleep lab.  Pt is aware of his results Bipap titration sent to sleep pool.

## 2021-03-04 NOTE — Telephone Encounter (Signed)
Bipap titration scheduled 8/18. Patient is scheduled for lab study on 8/18. Patient understands her sleep study will be done at [facility] sleep lab. Patient understands she will receive a sleep packet in a week or so. Patient understands to call if she does not receive the sleep packet in a timely manner.  Patient agrees with treatment and thanked me for call.

## 2021-03-04 NOTE — Progress Notes (Signed)
No ICM remote transmission received for 02/28/2021 and next ICM transmission scheduled for 04/11/2021.

## 2021-03-04 NOTE — Telephone Encounter (Signed)
Bipap titration scheduled 8/18. Patient is scheduled for lab study on 8/18. Patient understands her sleep study will be done at [facility] sleep lab. Patient understands she will receive a sleep packet in a week or so. Patient understands to call if she does not receive the sleep packet in a timely manner.  Patient agrees with treatment and thanked me for call.  

## 2021-03-10 ENCOUNTER — Other Ambulatory Visit (HOSPITAL_COMMUNITY): Payer: Self-pay

## 2021-04-08 ENCOUNTER — Telehealth: Payer: Self-pay

## 2021-04-08 NOTE — Telephone Encounter (Signed)
Attempted ICM call to patient.  Left message inquiring if he has received the Carelink replacement monitor that was on back order for 2 months.  Advised to return call to provide update on Carlink monitor.

## 2021-04-08 NOTE — Telephone Encounter (Signed)
Received voice mail message from patient stating he has not received a replacement monitor.  He is doing well at this time.  Will reschedule fluid level check for 05/16/2021.

## 2021-04-14 ENCOUNTER — Telehealth: Payer: Self-pay | Admitting: Internal Medicine

## 2021-04-14 NOTE — Telephone Encounter (Signed)
..  [name] [name],you are scheduled for a virtual visit with your provider today.    Just as we do with appointments in the office, we must obtain your consent to participate.  Your consent will be active for this visit and any virtual visit you may have with one of our providers in the next 365 days.    If you have a MyChart account, I can also send a copy of this consent to you electronically.  All virtual visits are billed to your insurance company just like a traditional visit in the office.  As this is a virtual visit, video technology does not allow for your provider to perform a traditional examination.  This may limit your provider's ability to fully assess your condition.  If your provider identifies any concerns that need to be evaluated in person or the need to arrange testing such as labs, EKG, etc, we will make arrangements to do so.    Although advances in technology are sophisticated, we cannot ensure that it will always work on either your end or our end.  If the connection with a video visit is poor, we may have to switch to a telephone visit.  With either a video or telephone visit, we are not always able to ensure that we have a secure connection.   I need to obtain your verbal consent now.   Are you willing to proceed with your visit today?   [name] [name] has provided verbal consent on 04/14/2021 for a virtual visit (video or telephone).   Katharine Look 04/14/2021  5:19 PM

## 2021-04-14 NOTE — Progress Notes (Signed)
Virtual Visit via Telephone Note  I connected with [name], on 04/15/2021 at 10:42 AM by telephone due to the COVID-19 pandemic and verified that I am speaking with the correct person using two identifiers.  Due to current restrictions/limitations of in-office visits due to the COVID-19 pandemic, this scheduled clinical appointment was converted to a telehealth visit.   Consent: I discussed the limitations, risks, security and privacy concerns of performing an evaluation and management service by telephone and the availability of in person appointments. I also discussed with the patient that there may be a patient responsible charge related to this service. The patient expressed understanding and agreed to proceed.   Location of Patient: Home  Location of Provider: [facility] at [facility]   Persons participating in Telemedicine visit: Karion Pickler Ricky Stabs, NP Margorie John, CMA   History of Present Illness: [name] [name] is a 60 year-old male who presents for Covid-19.   Went to his father's funeral 5 days ago. Tested positive for Covid with home test 3 days ago. Symptoms include fatigue, runny nose, headache, body aches, and cough productive of clear phlegm. Denies shortness of breath and chest pain. Taking Tylenol helps some. Had 1 Covid vaccine, unsure of which one it was. Requesting Prednisone pack.    Past Medical History:  Diagnosis Date   AICD (automatic cardioverter/defibrillator) present    Arthritis    Atrial fibrillation (HCC)    CAD (coronary artery disease)    Mild nonobstructive (Cath 09)   Chronic systolic heart failure (HCC)    Nonischemic CM: echo 4/12 with EF 25-30%, grade 2 diast dysfxn, mild dilated aortic root 43 mm, trivial MR, mod LAE   COPD (chronic obstructive pulmonary disease) (HCC)    Dyspnea    with exertion    Ejection fraction < 50% 08/2019   20-25% noted on ECHO   H/O ETOH abuse    HTN (hypertension)    Hypersomnia     Nonischemic cardiomyopathy (HCC)    Obesity, morbid (HCC)    OSA on CPAP    Systolic and diastolic CHF, acute on chronic (HCC) 11/2016   Tobacco use disorder    Ventricular tachycardia (HCC)    No Known Allergies  Current Outpatient Medications on File Prior to Visit  Medication Sig Dispense Refill   albuterol (VENTOLIN HFA) 108 (90 Base) MCG/ACT inhaler Inhale 1 puff into the lungs every 6 (six) hours as needed for wheezing or shortness of breath. 18 g 3   amiodarone (PACERONE) 200 MG tablet Take 1 tablet (200 mg total) by mouth daily. 180 tablet 3   Ascorbic Acid (VITAMIN C) 100 MG tablet Take 100 mg by mouth daily.     atorvastatin (LIPITOR) 80 MG tablet Take 1 tablet (80 mg total) by mouth every evening. 90 tablet 3   cephALEXin (KEFLEX) 500 MG capsule Take 1 capsule (500 mg total) by mouth 2 (two) times daily. 14 capsule 0   docusate sodium (COLACE) 100 MG capsule Take 1 capsule (100 mg total) by mouth 2 (two) times daily as needed (for constipation). 60 capsule 0   empagliflozin (JARDIANCE) 10 MG TABS tablet Take 1 tablet (10 mg total) by mouth daily. 30 tablet 11   potassium chloride SA (KLOR-CON) 20 MEQ tablet Take 2 tablets (40 mEq total) by mouth daily. 60 tablet 3   sacubitril-valsartan (ENTRESTO) 49-51 MG Take 1 tablet by mouth 2 (two) times daily. 180 tablet 3   sotalol (BETAPACE) 80 MG tablet Take  1 tablet (80 mg total) by mouth every 12 (twelve) hours. 180 tablet 3   spironolactone (ALDACTONE) 25 MG tablet Take 0.5 tablets (12.5 mg total) by mouth daily. 45 tablet 3   sulfamethoxazole-trimethoprim (BACTRIM DS) 800-160 MG tablet Take 1 tablet by mouth 2 (two) times daily. 14 tablet 0   torsemide (DEMADEX) 20 MG tablet Take 3 tablets (60 mg total) by mouth every morning AND 2 tablets (40 mg total) every evening. 450 tablet 3   XARELTO 20 MG TABS tablet TAKE 1 TABLET DAILY WITH SUPPER. 90 tablet 3   No current facility-administered medications on file prior to visit.     Observations/Objective: Alert and oriented x 3. Not in acute distress. Physical examination not completed as this is a telemedicine visit.  Assessment and Plan: 1. COVID-19: - Prednisone and Guaifenesin as prescribed.  - Offered Nirmatrelvir Ritonavir, patient declined.  - Continue over-the-counter Acetaminophen.  - Patient was given clear instructions to go to Emergency Department or return to medical center if symptoms don't improve, worsen, or new problems develop.The patient verbalized understanding. - Follow-up with primary provider as scheduled.  - predniSONE (DELTASONE) 10 MG tablet; Take 6 tablets (60 mg total) by mouth daily with breakfast for 1 day, THEN 5 tablets (50 mg total) daily with breakfast for 1 day, THEN 4 tablets (40 mg total) daily with breakfast for 1 day, THEN 3 tablets (30 mg total) daily with breakfast for 1 day, THEN 2 tablets (20 mg total) daily with breakfast for 1 day, THEN 1 tablet (10 mg total) daily with breakfast for 1 day.  Dispense: 21 tablet; Refill: 0 - guaiFENesin 200 MG tablet; Take 1 tablet (200 mg total) by mouth every 4 (four) hours as needed for cough or to loosen phlegm.  Dispense: 30 tablet; Refill: 0   Follow Up Instructions: Follow-up with primary provider as scheduled. Report to the Emergency Department for any red flag symptoms discussed during today's visit.    I discussed the assessment and treatment plan with the patient. The patient was provided an opportunity to ask questions and all were answered. The patient agreed with the plan and demonstrated an understanding of the instructions.   The patient was advised to call back or seek an in-person evaluation if the symptoms worsen or if the condition fails to improve as anticipated.   I provided 10 minutes total of non-face-to-face time during this encounter.   [name], NP  [facility] at [facility] [place], [place] [phone] 04/15/2021, 10:42 AM

## 2021-04-15 ENCOUNTER — Telehealth (INDEPENDENT_AMBULATORY_CARE_PROVIDER_SITE_OTHER): Payer: Medicare HMO | Admitting: Family

## 2021-04-15 ENCOUNTER — Encounter: Payer: Self-pay | Admitting: Family

## 2021-04-15 ENCOUNTER — Other Ambulatory Visit: Payer: Self-pay

## 2021-04-15 DIAGNOSIS — U071 COVID-19: Secondary | ICD-10-CM | POA: Diagnosis not present

## 2021-04-15 MED ORDER — PREDNISONE 10 MG PO TABS: ORAL_TABLET | ORAL | 0 refills | Status: AC

## 2021-04-15 MED ORDER — GUAIFENESIN 200 MG PO TABS: 200.0000 mg | ORAL_TABLET | ORAL | 0 refills | Status: DC | PRN

## 2021-04-15 NOTE — Progress Notes (Signed)
Pt presents for telemedicine visit tested positive for COVID on Tuesday. Symptoms include runny nose, fatigue, and denies cough or fever.

## 2021-05-05 ENCOUNTER — Other Ambulatory Visit: Payer: Self-pay

## 2021-05-05 ENCOUNTER — Ambulatory Visit (HOSPITAL_BASED_OUTPATIENT_CLINIC_OR_DEPARTMENT_OTHER): Payer: Medicare HMO | Attending: Cardiology | Admitting: Cardiology

## 2021-05-05 DIAGNOSIS — R0902 Hypoxemia: Secondary | ICD-10-CM | POA: Diagnosis not present

## 2021-05-05 DIAGNOSIS — G4733 Obstructive sleep apnea (adult) (pediatric): Secondary | ICD-10-CM | POA: Diagnosis not present

## 2021-05-08 NOTE — Procedures (Signed)
   Patient Name: [name] [name], [name] [name] Date: 05/05/2021 Gender: Male D.O.B: 25-May-1961 Age (years): 60 Referring Provider: Marca Ancona Height (inches): 76 Interpreting Physician: Armanda Magic MD, ABSM Weight (lbs): 329 RPSGT: Armen Pickup BMI: 40 MRN: [id number] Neck Size: 20.00  CLINICAL INFORMATION The patient is referred for a BiPAP titration to treat sleep apnea.  SLEEP STUDY TECHNIQUE As per the AASM Manual for the Scoring of Sleep and Associated Events v2.3 (April 2016) with a hypopnea requiring 4% desaturations.  The channels recorded and monitored were frontal, central and occipital EEG, electrooculogram (EOG), submentalis EMG (chin), nasal and oral airflow, thoracic and abdominal wall motion, anterior tibialis EMG, snore microphone, electrocardiogram, and pulse oximetry. Bilevel positive airway pressure (BPAP) was initiated at the beginning of the study and titrated to treat sleep-disordered breathing.  MEDICATIONS Medications self-administered by patient taken the night of the study : N/A  RESPIRATORY PARAMETERS Optimal IPAP Pressure (cm): 25  AHI at Optimal Pressure (/hr) 0 Optimal EPAP Pressure (cm):21  Overall Minimal O2 (%):82.0  Minimal O2 at Optimal Pressure (%): 86.0  SLEEP ARCHITECTURE Start Time:9:48:30 PM  Stop Time:4:51:18 AM  Total Time (min):422.8  Total Sleep Time (min):258 Sleep Latency (min):23.2  Sleep Efficiency (%):61.0%  REM Latency (min):59.5  WASO (min):141.6 Stage N1 (%): 13.2%  Stage N2 (%): 51.4%  Stage N3 (%): 6.6%  Stage R (%):28.9 Supine (%):37.79  Arousal Index (/hr):44.7   CARDIAC DATA The 2 lead EKG demonstrated sinus rhythm, pacemaker generated. The mean heart rate was 54.4 beats per minute. Other EKG findings include: PACs, PVCs.  LEG MOVEMENT DATA The total Periodic Limb Movements of Sleep (PLMS) were 0. The PLMS index was 0.0. A PLMS index of <15 is considered normal in adults.  IMPRESSIONS - An optimal PAP pressure  was selected for this patient ( 25 / cm of water) - Central sleep apnea was not noted during this titration (CAI = 1.4/h). - Moderete oxygen desaturations were observed during this titration (min O2 = 82.0%). - No snoring was audible during this study. - PACs and PVCs  were observed during this study. - Clinically significant periodic limb movements were not noted during this study. Arousals associated with PLMs were rare.  DIAGNOSIS - Obstructive Sleep Apnea (G47.33) - Nocturnal Hypoxemia  RECOMMENDATIONS - Trial of auto BiPAP therapy with IPAP max 20cm H2O, EPAP min 5cm H2O and PS 4cm H2O with a Large size Fisher&Paykel Full Face Mask Simplus mask, chin strap and heated humidification. - Avoid alcohol, sedatives and other CNS depressants that may worsen sleep apnea and disrupt normal sleep architecture. - Sleep hygiene should be reviewed to assess factors that may improve sleep quality. - Weight management and regular exercise should be initiated or continued. - Return to Sleep Center for re-evaluation after 4 weeks of therapy - Recommend overnight pulse oximetry on BiPAP to determine if supplemental O2 is needed.  [Electronically signed] 05/08/2021 10:52 PM  Armanda Magic MD, ABSM Diplomate, American Board of Sleep Medicine

## 2021-05-15 ENCOUNTER — Ambulatory Visit (INDEPENDENT_AMBULATORY_CARE_PROVIDER_SITE_OTHER): Payer: Medicare HMO

## 2021-05-15 DIAGNOSIS — Z0000 Encounter for general adult medical examination without abnormal findings: Secondary | ICD-10-CM | POA: Diagnosis not present

## 2021-05-15 DIAGNOSIS — Z1211 Encounter for screening for malignant neoplasm of colon: Secondary | ICD-10-CM

## 2021-05-15 NOTE — Patient Instructions (Signed)
Health Maintenance, Male Adopting a healthy lifestyle and getting preventive care are important in promoting health and wellness. Ask your health care provider about: The right schedule for you to have regular tests and exams. Things you can do on your own to prevent diseases and keep yourself healthy. What should I know about diet, weight, and exercise? Eat a healthy diet  Eat a diet that includes plenty of vegetables, fruits, low-fat dairy products, and lean protein. Do not eat a lot of foods that are high in solid fats, added sugars, or sodium.  Maintain a healthy weight Body mass index (BMI) is a measurement that can be used to identify possible weight problems. It estimates body fat based on height and weight. Your health care provider can help determine your BMI and help you achieve or maintain ahealthy weight. Get regular exercise Get regular exercise. This is one of the most important things you can do for your health. Most adults should: Exercise for at least 150 minutes each week. The exercise should increase your heart rate and make you sweat (moderate-intensity exercise). Do strengthening exercises at least twice a week. This is in addition to the moderate-intensity exercise. Spend less time sitting. Even light physical activity can be beneficial. Watch cholesterol and blood lipids Have your blood tested for lipids and cholesterol at 58 years of age, then havethis test every 5 years. You may need to have your cholesterol levels checked more often if: Your lipid or cholesterol levels are high. You are older than 58 years of age. You are at high risk for heart disease. What should I know about cancer screening? Many types of cancers can be detected early and may often be prevented. Depending on your health history and family history, you may need to have cancer screening at various ages. This may include screening for: Colorectal cancer. Prostate cancer. Skin cancer. Lung  cancer. What should I know about heart disease, diabetes, and high blood pressure? Blood pressure and heart disease High blood pressure causes heart disease and increases the risk of stroke. This is more likely to develop in people who have high blood pressure readings, are of African descent, or are overweight. Talk with your health care provider about your target blood pressure readings. Have your blood pressure checked: Every 3-5 years if you are 18-39 years of age. Every year if you are 40 years old or older. If you are between the ages of 65 and 75 and are a current or former smoker, ask your health care provider if you should have a one-time screening for abdominal aortic aneurysm (AAA). Diabetes Have regular diabetes screenings. This checks your fasting blood sugar level. Have the screening done: Once every three years after age 45 if you are at a normal weight and have a low risk for diabetes. More often and at a younger age if you are overweight or have a high risk for diabetes. What should I know about preventing infection? Hepatitis B If you have a higher risk for hepatitis B, you should be screened for this virus. Talk with your health care provider to find out if you are at risk forhepatitis B infection. Hepatitis C Blood testing is recommended for: Everyone born from 1945 through 1965. Anyone with known risk factors for hepatitis C. Sexually transmitted infections (STIs) You should be screened each year for STIs, including gonorrhea and chlamydia, if: You are sexually active and are younger than 58 years of age. You are older than 58 years of age   and your health care provider tells you that you are at risk for this type of infection. Your sexual activity has changed since you were last screened, and you are at increased risk for chlamydia or gonorrhea. Ask your health care provider if you are at risk. Ask your health care provider about whether you are at high risk for HIV.  Your health care provider may recommend a prescription medicine to help prevent HIV infection. If you choose to take medicine to prevent HIV, you should first get tested for HIV. You should then be tested every 3 months for as long as you are taking the medicine. Follow these instructions at home: Lifestyle Do not use any products that contain nicotine or tobacco, such as cigarettes, e-cigarettes, and chewing tobacco. If you need help quitting, ask your health care provider. Do not use street drugs. Do not share needles. Ask your health care provider for help if you need support or information about quitting drugs. Alcohol use Do not drink alcohol if your health care provider tells you not to drink. If you drink alcohol: Limit how much you have to 0-2 drinks a day. Be aware of how much alcohol is in your drink. In the [place] one drink equals one 12 oz bottle of beer (355 mL), one 5 oz glass of wine (148 mL), or one 1 oz glass of hard liquor (44 mL). General instructions Schedule regular health, dental, and eye exams. Stay current with your vaccines. Tell your health care provider if: You often feel depressed. You have ever been abused or do not feel safe at home. Summary Adopting a healthy lifestyle and getting preventive care are important in promoting health and wellness. Follow your health care provider's instructions about healthy diet, exercising, and getting tested or screened for diseases. Follow your health care provider's instructions on monitoring your cholesterol and blood pressure. This information is not intended to replace advice given to you by your health care provider. Make sure you discuss any questions you have with your healthcare provider. Document Revised: 08/28/2018 Document Reviewed: 08/28/2018 Elsevier Patient Education  2022 [employer]  

## 2021-05-15 NOTE — Progress Notes (Cosign Needed)
I connected with  [name] [name] on 05/15/21 by an audio only telemedicine application and verified that I am speaking with the correct person using two identifiers.   I discussed the limitations, risks, security and privacy concerns of performing an evaluation and management service by telephone and the availability of in person appointments. I also discussed with the patient that there may be a patient responsible charge related to this service. The patient expressed understanding and verbally consented to this telephonic visit.  Location of Patient: Home Location of Provider: Office  List any persons and their role that are participating in the visit with the patient.    Subjective:   [name] [name] is a 60 y.o. male who presents for Medicare Annual/Subsequent preventive examination.  Review of Systems      Refer to PCP     Objective:    There were no vitals filed for this visit. There is no height or weight on file to calculate BMI.  Advanced Directives 05/05/2021 01/13/2021 07/30/2020 07/19/2020 04/18/2020 01/20/2020 11/19/2019  Does Patient Have a Medical Advance Directive? Yes No Yes Yes No No No  Type of Youth worker of [place];Living will Healthcare Power of [place];Living will - - -  Does patient want to make changes to medical advance directive? No - Patient declined - No - Patient declined - - - -  Copy of Healthcare Power of Attorney in Chart? No - copy requested - No - copy requested - - - -  Would patient like information on creating a medical advance directive? - Yes (MAU/Ambulatory/Procedural Areas - Information given) - - No - Patient declined - No - Patient declined    Current Medications (verified) Outpatient Encounter Medications as of 05/15/2021  Medication Sig   albuterol (VENTOLIN HFA) 108 (90 Base) MCG/ACT inhaler Inhale 1 puff into the lungs every 6 (six) hours as needed for wheezing or shortness of breath.    amiodarone (PACERONE) 200 MG tablet Take 1 tablet (200 mg total) by mouth daily.   Ascorbic Acid (VITAMIN C) 100 MG tablet Take 100 mg by mouth daily.   atorvastatin (LIPITOR) 80 MG tablet Take 1 tablet (80 mg total) by mouth every evening.   docusate sodium (COLACE) 100 MG capsule Take 1 capsule (100 mg total) by mouth 2 (two) times daily as needed (for constipation).   potassium chloride SA (KLOR-CON) 20 MEQ tablet Take 2 tablets (40 mEq total) by mouth daily.   sacubitril-valsartan (ENTRESTO) 49-51 MG Take 1 tablet by mouth 2 (two) times daily.   sotalol (BETAPACE) 80 MG tablet Take 1 tablet (80 mg total) by mouth every 12 (twelve) hours.   spironolactone (ALDACTONE) 25 MG tablet Take 0.5 tablets (12.5 mg total) by mouth daily.   torsemide (DEMADEX) 20 MG tablet Take 3 tablets (60 mg total) by mouth every morning AND 2 tablets (40 mg total) every evening.   XARELTO 20 MG TABS tablet TAKE 1 TABLET DAILY WITH SUPPER.   [DISCONTINUED] cephALEXin (KEFLEX) 500 MG capsule Take 1 capsule (500 mg total) by mouth 2 (two) times daily. (Patient not taking: Reported on 05/15/2021)   [DISCONTINUED] empagliflozin (JARDIANCE) 10 MG TABS tablet Take 1 tablet (10 mg total) by mouth daily. (Patient not taking: Reported on 05/15/2021)   [DISCONTINUED] guaiFENesin 200 MG tablet Take 1 tablet (200 mg total) by mouth every 4 (four) hours as needed for cough or to loosen phlegm. (Patient not taking: Reported on 05/15/2021)   [DISCONTINUED] sulfamethoxazole-trimethoprim (BACTRIM  DS) 800-160 MG tablet Take 1 tablet by mouth 2 (two) times daily.   No facility-administered encounter medications on file as of 05/15/2021.    Allergies (verified) Patient has no known allergies.   History: Past Medical History:  Diagnosis Date   AICD (automatic cardioverter/defibrillator) present    Arthritis    Atrial fibrillation (HCC)    CAD (coronary artery disease)    Mild nonobstructive (Cath 09)   Chronic systolic heart failure  (HCC)    Nonischemic CM: echo 4/12 with EF 25-30%, grade 2 diast dysfxn, mild dilated aortic root 43 mm, trivial MR, mod LAE   COPD (chronic obstructive pulmonary disease) (HCC)    Dyspnea    with exertion    Ejection fraction < 50% 08/2019   20-25% noted on ECHO   H/O ETOH abuse    HTN (hypertension)    Hypersomnia    Nonischemic cardiomyopathy (HCC)    Obesity, morbid (HCC)    OSA on CPAP    Systolic and diastolic CHF, acute on chronic (HCC) 11/2016   Tobacco use disorder    Ventricular tachycardia (HCC)    Past Surgical History:  Procedure Laterality Date   CARDIAC CATHETERIZATION N/A 04/08/2015   Procedure: Right/Left Heart Cath and Coronary Angiography;  Surgeon: Corky Crafts, MD;  Location: [facility] INVASIVE CV LAB;  Service: Cardiovascular;  Laterality: N/A;   EP IMPLANTABLE DEVICE N/A 04/14/2015   Procedure: BiV ICD Insertion CRT-D;  Surgeon: [name], MD;  Location: [facility] INVASIVE CV LAB;  Service: Cardiovascular;  Laterality: N/A;   HYDROCELE EXCISION / REPAIR     ICD IMPLANT     TOTAL HIP ARTHROPLASTY Right 11/04/2018   Procedure: TOTAL HIP ARTHROPLASTY ANTERIOR APPROACH;  Surgeon: [name], MD;  Location: [facility] ORS;  Service: Orthopedics;  Laterality: Right;   TOTAL KNEE ARTHROPLASTY Right 01/30/2020   Procedure: TOTAL KNEE ARTHROPLASTY;  Surgeon: [name], MD;  Location: [facility] ORS;  Service: Orthopedics;  Laterality: Right;   TOTAL KNEE ARTHROPLASTY Left 07/30/2020   Procedure: TOTAL KNEE ARTHROPLASTY;  Surgeon: [name], MD;  Location: [facility] OR[facility];  Service: Orthopedics;  Laterality: Left;   TUMOR EXCISION Left    TUMOR EXCISION Left 1982   Benign tumor removed from L leg   VASECTOMY     Family History  Problem Relation Age of Onset   Other Mother        cardiac surgery. late 1990s   Heart Problems Mother        CABG AGE 47   Congestive Heart Failure Father    Healthy Father        AGE 88   Coronary artery disease Other    Healthy Brother        AGE 51    Healthy Sister        AGE 20   Healthy Sister        AGE 8   Healthy Son    Healthy Son    Healthy Daughter    Social History   Socioeconomic History   Marital status: Single    Spouse name: Not on file   Number of children: 2   Years of education: Not on file   Highest education level: Not on file  Occupational History   Occupation: Copy   Occupation: disabled, odd jobs  Tobacco Use   Smoking status: Former    Packs/day: 0.50    Years: 4.00    Pack years: 2.00    Types: Cigarettes  Quit date: 10/29/2005    Years since quitting: 15.5   Smokeless tobacco: Never   Tobacco comments:    about a pack a week. social  Vaping Use   Vaping Use: Never used  Substance and Sexual Activity   Alcohol use: Yes    Comment: 12 beers per week    Drug use: Not Currently    Types: Marijuana   Sexual activity: Yes    Partners: Female  Other Topics Concern   Not on file  Social History Narrative   ** Merged History Encounter **       Social Determinants of Health   Financial Resource Strain: Low Risk    Difficulty of Paying Living Expenses: Not hard at all  Food Insecurity: No Food Insecurity   Worried About Programme researcher, broadcasting/film/video in the Last Year: Never true   Barista in the Last Year: Never true  Transportation Needs: No Transportation Needs   Lack of Transportation (Medical): No   Lack of Transportation (Non-Medical): No  Physical Activity: Insufficiently Active   Days of Exercise per Week: 2 days   Minutes of Exercise per Session: 20 min  Stress: No Stress Concern Present   Feeling of Stress : Not at all  Social Connections: Moderately Isolated   Frequency of Communication with Friends and Family: More than three times a week   Frequency of Social Gatherings with Friends and Family: More than three times a week   Attends Religious Services: 1 to 4 times per year   Active Member of [employer] or Organizations: No   Attends Banker Meetings: Never    Marital Status: Divorced    Tobacco Counseling Counseling given: Not Answered Tobacco comments: about a pack a week. social   Clinical Intake:                 Diabetic?No         Activities of Daily Living In your present state of health, do you have any difficulty performing the following activities: 05/15/2021 07/30/2020  Hearing? N N  Vision? Y N  Difficulty concentrating or making decisions? N N  Walking or climbing stairs? N Y  Dressing or bathing? N N  Doing errands, shopping? N N  Some recent data might be hidden    Patient Care Team: Arvilla Market, MD as PCP - General (Family Medicine) [name], [name], MD as PCP - Advanced Heart Failure (Cardiology) [name], MD as PCP - Electrophysiology (Cardiology) [name], MD as PCP - Cardiology (Cardiology) [name], MD (Family Medicine) [name], LCSW as Social Worker (Licensed Clinical Social Worker)  Indicate any recent [employer] you may have received from other than [facility] providers in the past year (date may be approximate).     Assessment:   This is a routine wellness examination for Shoaib.  Hearing/Vision screen No results found.  Dietary issues and exercise activities discussed:     Goals Addressed   None   Depression Screen PHQ 2/9 Scores 05/15/2021 04/15/2021  PHQ - 2 Score 0 0  PHQ- 9 Score 3 -    Fall Risk Fall Risk  05/15/2021  Falls in the past year? 0  Number falls in past yr: 0  Injury with Fall? 0  Risk for fall due to : No Fall Risks  Follow up Falls evaluation completed    FALL RISK PREVENTION PERTAINING TO THE HOME:  Any stairs in or around the  home? Yes  If so, are there any without handrails? Yes  Home free of loose throw rugs in walkways, pet beds, electrical cords, etc? Yes  Adequate lighting in your home to reduce risk of falls? Yes   ASSISTIVE DEVICES UTILIZED TO PREVENT FALLS:  Life alert? No  Use of a cane,  walker or w/c? No  Grab bars in the bathroom? Yes  Shower chair or bench in shower? No  Elevated toilet seat or a handicapped toilet? No   TIMED UP AND GO:  Was the test performed? No .  Length of time to ambulate 10 feet: N/A sec.     Cognitive Function:     6CIT Screen 05/15/2021  What Year? 0 points  What month? 0 points  What time? 0 points  Count back from 20 0 points  Months in reverse 0 points  Repeat phrase 0 points  Total Score 0    Immunizations Immunization History  Administered Date(s) Administered   Influenza, Quadrivalent, Recombinant, Inj, Pf 05/22/2018   Influenza,inj,Quad PF,6+ Mos 05/31/2017, 11/21/2019   Pneumococcal Polysaccharide-23 11/21/2019    TDAP status: Due, Education has been provided regarding the importance of this vaccine. Advised may receive this vaccine at local pharmacy or Health Dept. Aware to provide a copy of the vaccination record if obtained from local pharmacy or Health Dept. Verbalized acceptance and understanding.  Flu Vaccine status: Due, Education has been provided regarding the importance of this vaccine. Advised may receive this vaccine at local pharmacy or Health Dept. Aware to provide a copy of the vaccination record if obtained from local pharmacy or Health Dept. Verbalized acceptance and understanding.  Pneumococcal vaccine status: Due, Education has been provided regarding the importance of this vaccine. Advised may receive this vaccine at local pharmacy or Health Dept. Aware to provide a copy of the vaccination record if obtained from local pharmacy or Health Dept. Verbalized acceptance and understanding.  Covid-19 vaccine status: Completed vaccines  Qualifies for Shingles Vaccine? Yes   Zostavax completed No   Shingrix Completed?: No.    Education has been provided regarding the importance of this vaccine. Patient has been advised to call insurance company to determine out of pocket expense if they have not yet received  this vaccine. Advised may also receive vaccine at local pharmacy or Health Dept. Verbalized acceptance and understanding.  Screening Tests Health Maintenance  Topic Date Due   COVID-19 Vaccine (1) Never done   Hepatitis C Screening  Never done   TETANUS/TDAP  Never done   COLONOSCOPY (Pts 45-86yrs Insurance coverage will need to be confirmed)  Never done   Zoster Vaccines- Shingrix (1 of 2) Never done   Pneumococcal Vaccine 49-76 Years old (2 - PCV) 11/20/2020   INFLUENZA VACCINE  04/18/2021   HIV Screening  Completed   HPV VACCINES  Aged Out    Health Maintenance  Health Maintenance Due  Topic Date Due   COVID-19 Vaccine (1) Never done   Hepatitis C Screening  Never done   TETANUS/TDAP  Never done   COLONOSCOPY (Pts 45-69yrs Insurance coverage will need to be confirmed)  Never done   Zoster Vaccines- Shingrix (1 of 2) Never done   Pneumococcal Vaccine 34-5 Years old (2 - PCV) 11/20/2020   INFLUENZA VACCINE  04/18/2021     Colorectal cancer screening: Referral to GI placed ORDERING COLOGUARD. Pt aware the office will call re: appt. Lung Cancer Screening: (Low Dose CT Chest recommended if Age 40-80 years, 30 pack-year currently smoking  OR have quit w/in 15years.) does not qualify.   Lung Cancer Screening Referral:   Additional Screening:  Hepatitis C Screening: does qualify; Completed patient declined  Vision Screening: Recommended annual ophthalmology exams for early detection of glaucoma and other disorders of the eye. Is the patient up to date with their annual eye exam?  No  Who is the provider or what is the name of the office in which the patient attends annual eye exams?  If pt is not established with a provider, would they like to be referred to a provider to establish care? No .   Dental Screening: Recommended annual dental exams for proper oral hygiene  Community Resource Referral / Chronic Care Management: CRR required this visit?  No   CCM required this  visit?  No      Plan:     I have personally reviewed and noted the following in the patient's chart:   Medical and social history Use of alcohol, tobacco or illicit drugs  Current medications and supplements including opioid prescriptions. Patient is not currently taking opioid prescriptions. Functional ability and status Nutritional status Physical activity Advanced directives List of other physicians Hospitalizations, surgeries, and ER visits in previous 12 months Vitals Screenings to include cognitive, depression, and falls Referrals and appointments  In addition, I have reviewed and discussed with patient certain preventive protocols, quality metrics, and best practice recommendations. A written personalized care plan for preventive services as well as general preventive health recommendations were provided to patient.     Laren Evertsthena M April Colter, CMA   05/15/2021   Nurse Notes: None face to face-35 minutes   Mr. Havel , Thank you for taking time to come for your Medicare Wellness Visit. I appreciate your ongoing commitment to your health goals. Please review the following plan we discussed and let me know if I can assist you in the future.   These are the goals we discussed:  Goals   None     This is a list of the screening recommended for you and due dates:  Health Maintenance  Topic Date Due   COVID-19 Vaccine (1) Never done   Hepatitis C Screening: USPSTF Recommendation to screen - Ages 5118-79 yo.  Never done   Tetanus Vaccine  Never done   Colon Cancer Screening  Never done   Zoster (Shingles) Vaccine (1 of 2) Never done   Pneumococcal Vaccination (2 - PCV) 11/20/2020   Flu Shot  04/18/2021   HIV Screening  Completed   HPV Vaccine  Aged Out

## 2021-05-16 NOTE — Progress Notes (Signed)
No ICM remote transmission received for 05/16/2021 and next ICM transmission scheduled for 06/20/2021.   Pt waiting on new monitor and per [facility] website new monitor has not been distributed.

## 2021-05-18 ENCOUNTER — Ambulatory Visit (HOSPITAL_COMMUNITY)
Admission: RE | Admit: 2021-05-18 | Discharge: 2021-05-18 | Disposition: A | Discharge status: Home / Self Care | Payer: Medicare HMO | Source: Ambulatory Visit | Attending: Cardiology | Admitting: Cardiology

## 2021-05-18 ENCOUNTER — Encounter (HOSPITAL_COMMUNITY): Payer: Self-pay | Admitting: Cardiology

## 2021-05-18 ENCOUNTER — Other Ambulatory Visit: Payer: Self-pay

## 2021-05-18 VITALS — BP 100/58 | HR 61 | Wt 326.0 lb

## 2021-05-18 DIAGNOSIS — Z8249 Family history of ischemic heart disease and other diseases of the circulatory system: Secondary | ICD-10-CM | POA: Insufficient documentation

## 2021-05-18 DIAGNOSIS — F1010 Alcohol abuse, uncomplicated: Secondary | ICD-10-CM | POA: Diagnosis not present

## 2021-05-18 DIAGNOSIS — I110 Hypertensive heart disease with heart failure: Secondary | ICD-10-CM | POA: Diagnosis not present

## 2021-05-18 DIAGNOSIS — I5022 Chronic systolic (congestive) heart failure: Secondary | ICD-10-CM | POA: Insufficient documentation

## 2021-05-18 DIAGNOSIS — E785 Hyperlipidemia, unspecified: Secondary | ICD-10-CM | POA: Diagnosis not present

## 2021-05-18 DIAGNOSIS — Z79899 Other long term (current) drug therapy: Secondary | ICD-10-CM | POA: Insufficient documentation

## 2021-05-18 DIAGNOSIS — G4733 Obstructive sleep apnea (adult) (pediatric): Secondary | ICD-10-CM | POA: Diagnosis not present

## 2021-05-18 DIAGNOSIS — Z95810 Presence of automatic (implantable) cardiac defibrillator: Secondary | ICD-10-CM | POA: Diagnosis not present

## 2021-05-18 DIAGNOSIS — Z7901 Long term (current) use of anticoagulants: Secondary | ICD-10-CM | POA: Insufficient documentation

## 2021-05-18 DIAGNOSIS — I480 Paroxysmal atrial fibrillation: Secondary | ICD-10-CM | POA: Insufficient documentation

## 2021-05-18 DIAGNOSIS — Z8616 Personal history of COVID-19: Secondary | ICD-10-CM | POA: Diagnosis not present

## 2021-05-18 DIAGNOSIS — Z87891 Personal history of nicotine dependence: Secondary | ICD-10-CM | POA: Diagnosis not present

## 2021-05-18 DIAGNOSIS — I428 Other cardiomyopathies: Secondary | ICD-10-CM | POA: Diagnosis not present

## 2021-05-18 DIAGNOSIS — J449 Chronic obstructive pulmonary disease, unspecified: Secondary | ICD-10-CM | POA: Diagnosis not present

## 2021-05-18 DIAGNOSIS — Z6841 Body Mass Index (BMI) 40.0 and over, adult: Secondary | ICD-10-CM | POA: Diagnosis not present

## 2021-05-18 LAB — LIPID PANEL
Cholesterol: 104 mg/dL (ref 0–200)
HDL: 40 mg/dL — ABNORMAL LOW (ref >40)
LDL Cholesterol: 50 mg/dL (ref 0–99)
Total CHOL/HDL Ratio: 2.6 RATIO
Triglycerides: 69 mg/dL (ref <150)
VLDL: 14 mg/dL (ref 0–40)

## 2021-05-18 LAB — COMPREHENSIVE METABOLIC PANEL
ALT: 23 U/L (ref 0–44)
AST: 19 U/L (ref 15–41)
Albumin: 3.7 g/dL (ref 3.5–5.0)
Alkaline Phosphatase: 76 U/L (ref 38–126)
Anion gap: 8 (ref 5–15)
BUN: 18 mg/dL (ref 6–20)
CO2: 27 mmol/L (ref 22–32)
Calcium: 9.1 mg/dL (ref 8.9–10.3)
Chloride: 103 mmol/L (ref 98–111)
Creatinine, Ser: 1.31 mg/dL — ABNORMAL HIGH (ref 0.61–1.24)
GFR, Estimated: >60 mL/min (ref >60)
Glucose, Bld: 117 mg/dL — ABNORMAL HIGH (ref 70–99)
Potassium: 4.4 mmol/L (ref 3.5–5.1)
Sodium: 138 mmol/L (ref 135–145)
Total Bilirubin: 0.9 mg/dL (ref 0.3–1.2)
Total Protein: 6.2 g/dL — ABNORMAL LOW (ref 6.5–8.1)

## 2021-05-18 LAB — CBC
HCT: 33.7 % — ABNORMAL LOW (ref 39.0–52.0)
Hemoglobin: 11.4 g/dL — ABNORMAL LOW (ref 13.0–17.0)
MCH: 33.3 pg (ref 26.0–34.0)
MCHC: 33.8 g/dL (ref 30.0–36.0)
MCV: 98.5 fL (ref 80.0–100.0)
Platelets: 183 10*3/uL (ref 150–400)
RBC: 3.42 MIL/uL — ABNORMAL LOW (ref 4.22–5.81)
RDW: 13.7 % (ref 11.5–15.5)
WBC: 4.2 10*3/uL (ref 4.0–10.5)
nRBC: 0 % (ref 0.0–0.2)

## 2021-05-18 LAB — TSH: TSH: 1.095 u[IU]/mL (ref 0.350–4.500)

## 2021-05-18 LAB — BRAIN NATRIURETIC PEPTIDE: B Natriuretic Peptide: 137.8 pg/mL — ABNORMAL HIGH (ref 0.0–100.0)

## 2021-05-18 MED ORDER — SPIRONOLACTONE 25 MG PO TABS: 25.0000 mg | ORAL_TABLET | Freq: Every day | ORAL | 3 refills | Status: DC

## 2021-05-18 NOTE — Progress Notes (Signed)
Advanced Heart Failure Clinic Note   Primary Care: Dr. Windle Guard, MD HF Cardiology: Dr. Shirlee Latch  HPI: Mr. Troiano is a 60 y.o. male with a past medical history of chronic systolic CHF  (EF 15% in April 2018), dilated NICM s/p BiV ICD, PAF with intermittent compliance with Xarelto, ETOH abuse, tobacco abuse, OSA on CPAP, and HTN.   Admitted 12/25/16-12/27/16 with acute on chronic systolic CHF, volume overload. He was diuresed 11 pounds on IV lasix.  Discharge weight was 294 pounds. It was felt that the etiology of his NICM was ETOH vs hypertension related.   Admitted 5/14-5/16/18 with volume overload, acute respiratory failure requiring Bipap. He was not taking his Entresto as he was confused about dosing and there was a mix up his pharmacy. He was diuresed with IV lasix, discharge weight was 295 pounds.   Admitted 05/30/17-06/02/17 with VT, ICD shock x 4. He is now on amiodarone and sotalol.  Echo in 12/20 showed EF 20-25%, severe LV dilation, moderate LVH, normal RV, small IVC.   He was admitted in 3/21 with ICD shock in setting of hypokalemia and heavy ETOH. Entresto was decreased with low BP.  He was diuresed due to volume overload.   He had left TKR in 11/21 with no complication.   Echo in 12/21 showed EF 20-25%, moderate LVH, normal RV, normal IVC.   Today he returns for HF followup.  Generally doing well.  He is short of breath with long walks.  No orthopnea/PND.  No chest pain.  He is drinking about 2 beers/day.  He stopped Jardiance after he had his 3rd UTI.  He has OSA and is waiting on CPAP.   ECG (personally reviewed): NSR, BiV paced with PVCs, QTc 464 msec  Medtronic device interrogation: fluid index < threshold with stable thoracic impedance, no atrial fibrillation, 88% BiV pacing.   Labs (9/18): K 3.8, creatinine 1.02 Labs (11/18): K 4.4, creatinine 1.19, TSH normal, LFTs normal Labs (11/05/18): K 4 Creatinine 1.05  Labs (12/20): K 4, creatinine 1.19 Labs (3/21): K 4.1,  creatinine 1.05, hgb 10.8, LFTs normal, TSH normal Labs (11/21): K 4.7, creatinine 1.24, hgb 11.9 Labs (12/21): K 4, creatinine 1.05, TSH and LFTs normal Labs (5/22): K 4.8, creatinine 1.67  PMH: 1. Chronic systolic CHF: Medtronic CRT-D.  Nonischemic cardiomyopathy. Possibly ETOH-related.  - LHC (2016): No CAD.  - Echo (2016): EF 20-25%, diffuse hypokinesis.  - Echo (4/18): EF 15%, normal RV size and systolic function.  - Echo (11/18): EF 20%, mildly dilated LV with mild LVH, normal RV size with mildly decreased RV systolic function.  - Echo (12/20): EF 20-25%, severe LV dilation, moderate LVH, normal RV, small IVC.  - Echo (12/21): EF 20-25%, moderate LVH, normal RV, normal IVC. 2. Atrial fibrillation: Paroxysmal.   3. OSA: On CPAP.  4. ETOH abuse: Prior heavy ETOH.  5. H/o VT 6. COPD: Prior smoker.  7. COVID-19 infection 7/22  ROS: All systems reviewed and negative except as per HPI.   Current Outpatient Medications  Medication Sig Dispense Refill   albuterol (VENTOLIN HFA) 108 (90 Base) MCG/ACT inhaler Inhale 1 puff into the lungs every 6 (six) hours as needed for wheezing or shortness of breath. 18 g 3   amiodarone (PACERONE) 200 MG tablet Take 1 tablet (200 mg total) by mouth daily. 180 tablet 3   Ascorbic Acid (VITAMIN C) 100 MG tablet Take 100 mg by mouth daily.     atorvastatin (LIPITOR) 80 MG tablet Take 1  tablet (80 mg total) by mouth every evening. 90 tablet 3   docusate sodium (COLACE) 100 MG capsule Take 1 capsule (100 mg total) by mouth 2 (two) times daily as needed (for constipation). 60 capsule 0   sacubitril-valsartan (ENTRESTO) 49-51 MG Take 1 tablet by mouth 2 (two) times daily. 180 tablet 3   sotalol (BETAPACE) 80 MG tablet Take 1 tablet (80 mg total) by mouth every 12 (twelve) hours. 180 tablet 3   torsemide (DEMADEX) 20 MG tablet Take 3 tablets (60 mg total) by mouth every morning AND 2 tablets (40 mg total) every evening. 450 tablet 3   XARELTO 20 MG TABS tablet  TAKE 1 TABLET DAILY WITH SUPPER. 90 tablet 3   spironolactone (ALDACTONE) 25 MG tablet Take 1 tablet (25 mg total) by mouth daily. 90 tablet 3   No current facility-administered medications for this encounter.    No Known Allergies    Social History   Socioeconomic History   Marital status: Single    Spouse name: Not on file   Number of children: 2   Years of education: Not on file   Highest education level: Not on file  Occupational History   Occupation: scrap metal   Occupation: disabled, odd jobs  Tobacco Use   Smoking status: Former    Packs/day: 0.50    Years: 4.00    Pack years: 2.00    Types: Cigarettes    Quit date: 10/29/2005    Years since quitting: 15.5   Smokeless tobacco: Never   Tobacco comments:    about a pack a week. social  Vaping Use   Vaping Use: Never used  Substance and Sexual Activity   Alcohol use: Yes    Comment: 12 beers per week    Drug use: Not Currently    Types: Marijuana   Sexual activity: Yes    Partners: Female  Other Topics Concern   Not on file  Social History Narrative   ** Merged History Encounter **       Social Determinants of Health   Financial Resource Strain: Low Risk    Difficulty of Paying Living Expenses: Not hard at all  Food Insecurity: No Food Insecurity   Worried About Programme researcher, broadcasting/film/video in the Last Year: Never true   Barista in the Last Year: Never true  Transportation Needs: No Transportation Needs   Lack of Transportation (Medical): No   Lack of Transportation (Non-Medical): No  Physical Activity: Insufficiently Active   Days of Exercise per Week: 2 days   Minutes of Exercise per Session: 20 min  Stress: No Stress Concern Present   Feeling of Stress : Not at all  Social Connections: Moderately Isolated   Frequency of Communication with Friends and Family: More than three times a week   Frequency of Social Gatherings with Friends and Family: More than three times a week   Attends Religious  Services: 1 to 4 times per year   Active Member of [employer] or Organizations: No   Attends Banker Meetings: Never   Marital Status: Divorced  Catering manager Violence: Not At Risk   Fear of Current or Ex-Partner: No   Emotionally Abused: No   Physically Abused: No   Sexually Abused: No      Family History  Problem Relation Age of Onset   Other Mother        cardiac surgery. late 1990s   Heart Problems Mother  CABG AGE 42   Congestive Heart Failure Father    Healthy Father        AGE 66   Coronary artery disease Other    Healthy Brother        AGE 30   Healthy Sister        AGE 26   Healthy Sister        AGE 9   Healthy Son    Healthy Son    Healthy Daughter     Vitals:   05/18/21 0932  BP: (!) 100/58  Pulse: 61  SpO2: 96%  Weight: (!) 147.9 kg (326 lb)   Wt Readings from Last 3 Encounters:  05/18/21 (!) 147.9 kg (326 lb)  05/05/21 (!) 149.2 kg (329 lb)  02/08/21 (!) 149.4 kg (329 lb 6.4 oz)     PHYSICAL EXAM: General: NAD Neck: No JVD, no thyromegaly or thyroid nodule.  Lungs: Clear to auscultation bilaterally with normal respiratory effort. CV: Nondisplaced PMI.  Heart regular S1/S2, no S3/S4, no murmur.  No peripheral edema.  No carotid bruit.  Normal pedal pulses.  Abdomen: Soft, nontender, no hepatosplenomegaly, no distention.  Skin: Intact without lesions or rashes.  Neurologic: Alert and oriented x 3.  Psych: Normal affect. Extremities: No clubbing or cyanosis.  HEENT: Normal.   ASSESSMENT & PLAN: 1. Chronic systolic CHF: NICM, Echo 12/2016 EF 15%. Normal coronaries on cath in July 2016. Medtronic CRT-D device. ETOH vs HTN vs myocarditis as cause of cardiomyopathy. Echo 12/21 showed that EF remains 20-25%.  NYHA class II symptoms.  Weight down 3 lbs, not volume overloaded on exam or Optivol.  BP on the low side but no orthostatic symptoms.  Of note, he is only BiV pacing 88% of the time.  No atrial fibrillation detected.  - Continue  torsemide 60 qam/40 qpm.  BMET today.    - Continue Entresto 49/51 twice a day, BP relatively low so will not increase today.   - Increase spironolactone back to 25 mg daily and stop KCl supplement.  BMET in 10 days.  - Intolerant to Coreg in the past with hypotension, he is on sotalol and amiodarone.  - He stopped Jardiance after 3 UTIs, I will not start back.  - Continue to minimize ETOH. - I will discuss BiV pacing with EP, can this be optimized (low percentage)?  - Echo at followup in 3 months.  2. Paroxysmal atrial fibrillation: He is in NSR today.  - Continue amiodarone 200 mg daily.  Check LFTs/TSH today.  Should have regular eye exam.  - Continue Xarelto 20 mg daily.  CBC today.  3. ETOH abuse: Suspect this contributes to cardiomyopathy.  Still drinking occasionally, I strongly encouraged him to quit.   4. Tobacco abuse: No longer smoking.   5. VT: Followed closely by EP, he is now on amiodarone and sotalol (QTc ok at 464 msec today).  6. OSA: Waiting to get CPAP unit.    Followup 3 mos with echo  Marca Ancona, MD 05/18/21

## 2021-05-18 NOTE — Patient Instructions (Signed)
EKG done today.  Labs done today. We will contact you only if your labs are abnormal.  STOP taking Potassium  INCREASE Spironolactone to 25mg  (1 tablet) by mouth daily.   No other medication changes were made. Please continue all current medications as prescribed.  Your physician recommends that you schedule a follow-up appointment in: 10 days for a lab only appointment and in 3 months with an echo prior to your exam.   Your physician has requested that you have an echocardiogram. Echocardiography is a painless test that uses sound waves to create images of your heart. It provides your doctor with information about the size and shape of your heart and how well your heart's chambers and valves are working. This procedure takes approximately one hour. There are no restrictions for this procedure.  If you have any questions or concerns before your next appointment please send a message through [place] or call our office at [phone].    TO LEAVE A MESSAGE FOR THE NURSE SELECT OPTION 2, PLEASE LEAVE A MESSAGE INCLUDING: YOUR NAME DATE OF BIRTH CALL BACK NUMBER REASON FOR CALL**this is important as we prioritize the call backs  YOU WILL RECEIVE A CALL BACK THE SAME DAY AS LONG AS YOU CALL BEFORE 4:00 PM   Do the following things EVERYDAY: Weigh yourself in the morning before breakfast. Write it down and keep it in a log. Take your medicines as prescribed Eat low salt foods--Limit salt (sodium) to 2000 mg per day.  Stay as active as you can everyday Limit all fluids for the day to less than 2 liters   At the [facility], you and your health needs are our priority. As part of our continuing mission to provide you with exceptional heart care, we have created designated Provider Care Teams. These Care Teams include your primary Cardiologist (physician) and Advanced Practice Providers (APPs- Physician Assistants and Nurse Practitioners) who all work together to provide  you with the care you need, when you need it.   You may see any of the following providers on your designated Care Team at your next follow up: Dr [phone] Dr Arvilla Meres, NP Carron Curie, Robbie Lis [place], PharmD   Please be sure to bring in all your medications bottles to every appointment.

## 2021-05-19 ENCOUNTER — Telehealth: Payer: Self-pay | Admitting: *Deleted

## 2021-05-19 DIAGNOSIS — G4733 Obstructive sleep apnea (adult) (pediatric): Secondary | ICD-10-CM

## 2021-05-19 NOTE — Telephone Encounter (Addendum)
The patient has been notified of the result and verbalized understanding.  Left detailed message on voicemail and informed patient to call back with questions. Latrelle Dodrill, CMA 05/19/2021 5:30 PM    Upon patient request DME selection is [facility]. Patient understands he will be contacted by [facility] to set up his cpap. Patient understands to call if [facility] does not contact him with new setup in a timely manner. Patient understands they will be called once confirmation has been received from [facility] that they have received their new machine to schedule 10 week follow up appointment.   [facility] notified of new cpap order  Please add to [facility]

## 2021-05-19 NOTE — Telephone Encounter (Signed)
-----   Message from [name], MD sent at 05/08/2021 10:55 PM EDT ----- Please let patient know that they will be set up on auto BiPAP titration and let DME know that orders are in EPIC.  Please set up 6 week OV with me. Get an overnight pulse ox on BiPAP

## 2021-05-29 ENCOUNTER — Ambulatory Visit (INDEPENDENT_AMBULATORY_CARE_PROVIDER_SITE_OTHER): Payer: Medicare HMO

## 2021-05-29 DIAGNOSIS — Z91199 Patient's noncompliance with other medical treatment and regimen due to unspecified reason: Secondary | ICD-10-CM

## 2021-05-29 DIAGNOSIS — Z5329 Procedure and treatment not carried out because of patient's decision for other reasons: Secondary | ICD-10-CM

## 2021-05-29 LAB — COLOGUARD

## 2021-05-29 NOTE — Progress Notes (Signed)
Called patient for AWV - patient did not answer left message for patient to call back to reschedule.

## 2021-05-29 NOTE — Patient Instructions (Signed)
Health Maintenance, Male Adopting a healthy lifestyle and getting preventive care are important in promoting health and wellness. Ask your health care provider about: The right schedule for you to have regular tests and exams. Things you can do on your own to prevent diseases and keep yourself healthy. What should I know about diet, weight, and exercise? Eat a healthy diet  Eat a diet that includes plenty of vegetables, fruits, low-fat dairy products, and lean protein. Do not eat a lot of foods that are high in solid fats, added sugars, or sodium. Maintain a healthy weight Body mass index (BMI) is a measurement that can be used to identify possible weight problems. It estimates body fat based on height and weight. Your health care provider can help determine your BMI and help you achieve or maintain a healthy weight. Get regular exercise Get regular exercise. This is one of the most important things you can do for your health. Most adults should: Exercise for at least 150 minutes each week. The exercise should increase your heart rate and make you sweat (moderate-intensity exercise). Do strengthening exercises at least twice a week. This is in addition to the moderate-intensity exercise. Spend less time sitting. Even light physical activity can be beneficial. Watch cholesterol and blood lipids Have your blood tested for lipids and cholesterol at 58 years of age, then have this test every 5 years. You may need to have your cholesterol levels checked more often if: Your lipid or cholesterol levels are high. You are older than 58 years of age. You are at high risk for heart disease. What should I know about cancer screening? Many types of cancers can be detected early and may often be prevented. Depending on your health history and family history, you may need to have cancer screening at various ages. This may include screening for: Colorectal cancer. Prostate cancer. Skin cancer. Lung  cancer. What should I know about heart disease, diabetes, and high blood pressure? Blood pressure and heart disease High blood pressure causes heart disease and increases the risk of stroke. This is more likely to develop in people who have high blood pressure readings, are of African descent, or are overweight. Talk with your health care provider about your target blood pressure readings. Have your blood pressure checked: Every 3-5 years if you are 18-39 years of age. Every year if you are 40 years old or older. If you are between the ages of 65 and 75 and are a current or former smoker, ask your health care provider if you should have a one-time screening for abdominal aortic aneurysm (AAA). Diabetes Have regular diabetes screenings. This checks your fasting blood sugar level. Have the screening done: Once every three years after age 45 if you are at a normal weight and have a low risk for diabetes. More often and at a younger age if you are overweight or have a high risk for diabetes. What should I know about preventing infection? Hepatitis B If you have a higher risk for hepatitis B, you should be screened for this virus. Talk with your health care provider to find out if you are at risk for hepatitis B infection. Hepatitis C Blood testing is recommended for: Everyone born from 1945 through 1965. Anyone with known risk factors for hepatitis C. Sexually transmitted infections (STIs) You should be screened each year for STIs, including gonorrhea and chlamydia, if: You are sexually active and are younger than 58 years of age. You are older than 58 years   of age and your health care provider tells you that you are at risk for this type of infection. Your sexual activity has changed since you were last screened, and you are at increased risk for chlamydia or gonorrhea. Ask your health care provider if you are at risk. Ask your health care provider about whether you are at high risk for HIV.  Your health care provider may recommend a prescription medicine to help prevent HIV infection. If you choose to take medicine to prevent HIV, you should first get tested for HIV. You should then be tested every 3 months for as long as you are taking the medicine. Follow these instructions at home: Lifestyle Do not use any products that contain nicotine or tobacco, such as cigarettes, e-cigarettes, and chewing tobacco. If you need help quitting, ask your health care provider. Do not use street drugs. Do not share needles. Ask your health care provider for help if you need support or information about quitting drugs. Alcohol use Do not drink alcohol if your health care provider tells you not to drink. If you drink alcohol: Limit how much you have to 0-2 drinks a day. Be aware of how much alcohol is in your drink. In the [place] one drink equals one 12 oz bottle of beer (355 mL), one 5 oz glass of wine (148 mL), or one 1 oz glass of hard liquor (44 mL). General instructions Schedule regular health, dental, and eye exams. Stay current with your vaccines. Tell your health care provider if: You often feel depressed. You have ever been abused or do not feel safe at home. Summary Adopting a healthy lifestyle and getting preventive care are important in promoting health and wellness. Follow your health care provider's instructions about healthy diet, exercising, and getting tested or screened for diseases. Follow your health care provider's instructions on monitoring your cholesterol and blood pressure. This information is not intended to replace advice given to you by your health care provider. Make sure you discuss any questions you have with your health care provider. Document Revised: 11/12/2020 Document Reviewed: 08/28/2018 Elsevier Patient Education  2022 Elsevier Inc.  

## 2021-05-30 ENCOUNTER — Ambulatory Visit (HOSPITAL_COMMUNITY)
Admission: RE | Admit: 2021-05-30 | Discharge: 2021-05-30 | Disposition: A | Discharge status: Home / Self Care | Payer: Medicare HMO | Source: Ambulatory Visit | Attending: Cardiology | Admitting: Cardiology

## 2021-05-30 ENCOUNTER — Ambulatory Visit: Payer: Medicare HMO

## 2021-05-30 ENCOUNTER — Other Ambulatory Visit: Payer: Self-pay

## 2021-05-30 DIAGNOSIS — I5022 Chronic systolic (congestive) heart failure: Secondary | ICD-10-CM | POA: Diagnosis not present

## 2021-05-30 LAB — BASIC METABOLIC PANEL
Anion gap: 8 (ref 5–15)
BUN: 21 mg/dL — ABNORMAL HIGH (ref 6–20)
CO2: 28 mmol/L (ref 22–32)
Calcium: 8.9 mg/dL (ref 8.9–10.3)
Chloride: 103 mmol/L (ref 98–111)
Creatinine, Ser: 1.37 mg/dL — ABNORMAL HIGH (ref 0.61–1.24)
GFR, Estimated: 60 mL/min — ABNORMAL LOW (ref >60)
Glucose, Bld: 109 mg/dL — ABNORMAL HIGH (ref 70–99)
Potassium: 3.8 mmol/L (ref 3.5–5.1)
Sodium: 139 mmol/L (ref 135–145)

## 2021-05-31 ENCOUNTER — Telehealth (HOSPITAL_COMMUNITY): Payer: Self-pay | Admitting: *Deleted

## 2021-05-31 NOTE — Telephone Encounter (Signed)
Pt dropped off Disability form for property tax exclusion, form completed and signed by Dr Shirlee Latch. Pt is aware and form mailed to his home address

## 2021-06-06 NOTE — Addendum Note (Signed)
Addended by: Victorio Palm on: 06/06/2021 03:41 PM   Modules accepted: Level of Service

## 2021-06-10 ENCOUNTER — Other Ambulatory Visit (HOSPITAL_COMMUNITY): Payer: Self-pay | Admitting: Cardiology

## 2021-06-14 ENCOUNTER — Other Ambulatory Visit (HOSPITAL_COMMUNITY): Payer: Self-pay

## 2021-06-14 ENCOUNTER — Telehealth (HOSPITAL_COMMUNITY): Payer: Self-pay | Admitting: Pharmacist

## 2021-06-14 ENCOUNTER — Telehealth (HOSPITAL_COMMUNITY): Payer: Self-pay | Admitting: Pharmacy Technician

## 2021-06-14 NOTE — Telephone Encounter (Signed)
Patient Advocate Encounter   Received notification from [facility] that prior authorization for Amiodarone 200mg  is required.   PA submitted on CoverMyMeds Key Status is pending   Will continue to follow.

## 2021-06-14 NOTE — Telephone Encounter (Signed)
Provided patient with Xarelto samples.  Medication: Xarelto 20 mg Quantity: 3 bottles  Lot: [id number] Expiration date: 06/2023   Karle Plumber, PharmD, BCPS, CPP Heart Failure Clinic Pharmacist [phone]

## 2021-06-15 ENCOUNTER — Other Ambulatory Visit (HOSPITAL_COMMUNITY): Payer: Self-pay | Admitting: Cardiology

## 2021-06-24 NOTE — Telephone Encounter (Signed)
Advanced Heart Failure Patient Advocate Encounter  Prior Authorization for Amiodarone has been approved.    PA# [id number] Effective dates: 06/14/21 through 09/17/21  Archer Asa, CPhT

## 2021-07-11 DIAGNOSIS — H6092 Unspecified otitis externa, left ear: Secondary | ICD-10-CM | POA: Diagnosis not present

## 2021-07-15 ENCOUNTER — Other Ambulatory Visit (HOSPITAL_COMMUNITY): Payer: Self-pay

## 2021-07-15 MED ORDER — ENTRESTO 49-51 MG PO TABS: 1.0000 | ORAL_TABLET | Freq: Two times a day (BID) | ORAL | 3 refills | Status: DC

## 2021-08-15 ENCOUNTER — Telehealth (HOSPITAL_COMMUNITY): Payer: Self-pay | Admitting: Pharmacy Technician

## 2021-08-15 NOTE — Telephone Encounter (Signed)
Advanced Heart Failure Patient Advocate Encounter  Spoke with patient regarding Entresto assistance renewal. Will bring application and POI to next appointment, this Friday.   Will fax in once signatures are obtained.   Archer Asa, CPhT

## 2021-08-17 ENCOUNTER — Other Ambulatory Visit (HOSPITAL_COMMUNITY): Payer: Self-pay

## 2021-08-17 MED ORDER — ENTRESTO 49-51 MG PO TABS: 1.0000 | ORAL_TABLET | Freq: Two times a day (BID) | ORAL | 3 refills | Status: DC

## 2021-08-19 ENCOUNTER — Other Ambulatory Visit: Payer: Self-pay

## 2021-08-19 ENCOUNTER — Ambulatory Visit (HOSPITAL_COMMUNITY)
Admission: RE | Admit: 2021-08-19 | Discharge: 2021-08-19 | Disposition: A | Discharge status: Home / Self Care | Payer: Medicare HMO | Source: Ambulatory Visit | Attending: Cardiology | Admitting: Cardiology

## 2021-08-19 ENCOUNTER — Ambulatory Visit (HOSPITAL_BASED_OUTPATIENT_CLINIC_OR_DEPARTMENT_OTHER)
Admission: RE | Admit: 2021-08-19 | Discharge: 2021-08-19 | Disposition: A | Discharge status: Home / Self Care | Payer: Medicare HMO | Source: Ambulatory Visit | Attending: Cardiology | Admitting: Cardiology

## 2021-08-19 VITALS — BP 98/60 | HR 55 | Wt 321.2 lb

## 2021-08-19 DIAGNOSIS — I5022 Chronic systolic (congestive) heart failure: Secondary | ICD-10-CM | POA: Insufficient documentation

## 2021-08-19 DIAGNOSIS — Z8616 Personal history of COVID-19: Secondary | ICD-10-CM | POA: Diagnosis not present

## 2021-08-19 DIAGNOSIS — I428 Other cardiomyopathies: Secondary | ICD-10-CM | POA: Insufficient documentation

## 2021-08-19 DIAGNOSIS — Z79899 Other long term (current) drug therapy: Secondary | ICD-10-CM | POA: Insufficient documentation

## 2021-08-19 DIAGNOSIS — Z87891 Personal history of nicotine dependence: Secondary | ICD-10-CM | POA: Insufficient documentation

## 2021-08-19 DIAGNOSIS — Z7901 Long term (current) use of anticoagulants: Secondary | ICD-10-CM | POA: Insufficient documentation

## 2021-08-19 DIAGNOSIS — F1010 Alcohol abuse, uncomplicated: Secondary | ICD-10-CM | POA: Insufficient documentation

## 2021-08-19 DIAGNOSIS — I110 Hypertensive heart disease with heart failure: Secondary | ICD-10-CM | POA: Insufficient documentation

## 2021-08-19 DIAGNOSIS — I480 Paroxysmal atrial fibrillation: Secondary | ICD-10-CM | POA: Diagnosis not present

## 2021-08-19 DIAGNOSIS — G4733 Obstructive sleep apnea (adult) (pediatric): Secondary | ICD-10-CM | POA: Diagnosis not present

## 2021-08-19 LAB — COMPREHENSIVE METABOLIC PANEL
ALT: 21 U/L (ref 0–44)
AST: 16 U/L (ref 15–41)
Albumin: 3.9 g/dL (ref 3.5–5.0)
Alkaline Phosphatase: 73 U/L (ref 38–126)
Anion gap: 5 (ref 5–15)
BUN: 15 mg/dL (ref 6–20)
CO2: 32 mmol/L (ref 22–32)
Calcium: 9.5 mg/dL (ref 8.9–10.3)
Chloride: 101 mmol/L (ref 98–111)
Creatinine, Ser: 1.18 mg/dL (ref 0.61–1.24)
GFR, Estimated: >60 mL/min (ref >60)
Glucose, Bld: 96 mg/dL (ref 70–99)
Potassium: 3.9 mmol/L (ref 3.5–5.1)
Sodium: 138 mmol/L (ref 135–145)
Total Bilirubin: 0.9 mg/dL (ref 0.3–1.2)
Total Protein: 6.5 g/dL (ref 6.5–8.1)

## 2021-08-19 LAB — CBC
HCT: 34.7 % — ABNORMAL LOW (ref 39.0–52.0)
Hemoglobin: 11.8 g/dL — ABNORMAL LOW (ref 13.0–17.0)
MCH: 32.5 pg (ref 26.0–34.0)
MCHC: 34 g/dL (ref 30.0–36.0)
MCV: 95.6 fL (ref 80.0–100.0)
Platelets: 189 10*3/uL (ref 150–400)
RBC: 3.63 MIL/uL — ABNORMAL LOW (ref 4.22–5.81)
RDW: 12.9 % (ref 11.5–15.5)
WBC: 4.5 10*3/uL (ref 4.0–10.5)
nRBC: 0 % (ref 0.0–0.2)

## 2021-08-19 LAB — ECHOCARDIOGRAM COMPLETE
Area-P 1/2: 2.42 cm2
Calc EF: 22.3 %
S' Lateral: 4.8 cm
Single Plane A2C EF: 20.3 %
Single Plane A4C EF: 27.1 %

## 2021-08-19 LAB — BRAIN NATRIURETIC PEPTIDE: B Natriuretic Peptide: 81.2 pg/mL (ref 0.0–100.0)

## 2021-08-19 LAB — TSH: TSH: 1.085 u[IU]/mL (ref 0.350–4.500)

## 2021-08-19 NOTE — Patient Instructions (Signed)
Medication Changes:  none  Lab Work:  Labs done today, your results will be available in MyChart, we will contact you for abnormal readings.   Testing/Procedures: Your physician has recommended that you have a cardiopulmonary stress test (CPX). CPX testing is a non-invasive measurement of heart and lung function. It replaces a traditional treadmill stress test. This type of test provides a tremendous amount of information that relates not only to your present condition but also for future outcomes. This test combines measurements of you ventilation, respiratory gas exchange in the lungs, electrocardiogram (EKG), blood pressure and physical response before, during, and following an exercise protocol. WE WILL CALL YOU NEXT WEEK WITH APPOINTMENT.   Referrals:  You have been referred to You have been referred to Electrophysiologist Doctor They will call you with an appoinment   Special Instructions // Education:  CPX test paper instructions  Follow-Up in: 3 months  At the [facility], you and your health needs are our priority. We have a designated team specialized in the treatment of Heart Failure. This Care Team includes your primary Heart Failure Specialized Cardiologist (physician), Advanced Practice Providers (APPs- Physician Assistants and Nurse Practitioners), and Pharmacist who all work together to provide you with the care you need, when you need it.   You may see any of the following providers on your designated Care Team at your next follow up:  Dr Arvilla Meres Dr Carron Curie, NP Robbie Lis, [place] [facility] [place], [place] Karle Plumber, PharmD   Please be sure to bring in all your medications bottles to every appointment.   Need to Contact us:  If you have any questions or concerns before your next appointment please send [place] a message through [place] or call our office at [phone].    TO LEAVE A MESSAGE FOR THE  NURSE SELECT OPTION 2, PLEASE LEAVE A MESSAGE INCLUDING: YOUR NAME DATE OF BIRTH CALL BACK NUMBER REASON FOR CALL**this is important as we prioritize the call backs  YOU WILL RECEIVE A CALL BACK THE SAME DAY AS LONG AS YOU CALL BEFORE 4:00 PM

## 2021-08-21 NOTE — Progress Notes (Signed)
[facility] Note   Primary Care: Dr. Claris Gower, MD HF Cardiology: Dr. Aundra Dubin  HPI: [name] is a 60 y.o. male with a past medical history of chronic systolic CHF  (EF 0000000 in April 2018), dilated NICM s/p BiV ICD, PAF with intermittent compliance with Xarelto, ETOH abuse, tobacco abuse, OSA on CPAP, and HTN.   Admitted 12/25/16-12/27/16 with acute on chronic systolic CHF, volume overload. He was diuresed 11 pounds on IV lasix.  Discharge weight was 294 pounds. It was felt that the etiology of his NICM was ETOH vs hypertension related.   Admitted 5/14-5/16/18 with volume overload, acute respiratory failure requiring Bipap. He was not taking his Entresto as he was confused about dosing and there was a mix up his pharmacy. He was diuresed with IV lasix, discharge weight was 295 pounds.   Admitted 05/30/17-06/02/17 with VT, ICD shock x 4. He is now on amiodarone and sotalol.  Echo in 12/20 showed EF 20-25%, severe LV dilation, moderate LVH, normal RV, small IVC.   He was admitted in 3/21 with ICD shock in setting of hypokalemia and heavy ETOH. Entresto was decreased with low BP.  He was diuresed due to volume overload.   He had left TKR in [id number] with no complication.   Echo in 12/21 showed EF 20-25%, moderate LVH, normal RV, normal IVC.   Echo was done today and reviewed, EF 20-25%, diffuse hypokinesis, mildly decreased RV systolic function.   Patient returns for followup of CHF.  No dyspnea walking on flat ground, mild dyspnea walking up stairs.  No lightheadedness.  No chest pain.  No orthopnea/PND.  He still drinks about 12 beers/week, mainly on weekends. Weight down 5 lbs.   ECG (personally reviewed): NSR, BiV paced with QTc 480 msec  Labs (9/18): K 3.8, creatinine 1.02 Labs (11/18): K 4.4, creatinine 1.19, TSH normal, LFTs normal Labs (11/05/18): K 4 Creatinine 1.05  Labs (12/20): K 4, creatinine 1.19 Labs (3/21): K 4.1, creatinine 1.05, hgb 10.8, LFTs normal, TSH  normal Labs (11/21): K 4.7, creatinine 1.24, hgb 11.9 Labs (12/21): K 4, creatinine 1.05, TSH and LFTs normal Labs (5/22): K 4.8, creatinine 1.67  PMH: 1. Chronic systolic CHF: Medtronic CRT-D.  Nonischemic cardiomyopathy. Possibly ETOH-related.  - LHC (2016): No CAD.  - Echo (2016): EF 20-25%, diffuse hypokinesis.  - Echo (4/18): EF 15%, normal RV size and systolic function.  - Echo (11/18): EF 20%, mildly dilated LV with mild LVH, normal RV size with mildly decreased RV systolic function.  - Echo (12/20): EF 20-25%, severe LV dilation, moderate LVH, normal RV, small IVC.  - Echo (12/21): EF 20-25%, moderate LVH, normal RV, normal IVC. - Echo (12/22): EF 20-25%, diffuse hypokinesis, mildly decreased RV systolic function.  2. Atrial fibrillation: Paroxysmal.   3. OSA: On CPAP.  4. ETOH abuse: Prior heavy ETOH.  5. H/o VT 6. COPD: Prior smoker.  7. COVID-19 infection 7/22  ROS: All systems reviewed and negative except as per HPI.   Current Outpatient Medications  Medication Sig Dispense Refill   albuterol (VENTOLIN HFA) 108 (90 Base) MCG/ACT inhaler Inhale 1 puff into the lungs every 6 (six) hours as needed for wheezing or shortness of breath. 18 g 3   amiodarone (PACERONE) 200 MG tablet Take 1 tablet (200 mg total) by mouth daily. 180 tablet 3   Ascorbic Acid (VITAMIN C) 100 MG tablet Take 100 mg by mouth daily.     atorvastatin (LIPITOR) 80 MG tablet TAKE 1  TABLET (80 MG TOTAL) BY MOUTH EVERY EVENING. 90 tablet 3   sacubitril-valsartan (ENTRESTO) 49-51 MG Take 1 tablet by mouth 2 (two) times daily. 180 tablet 3   sotalol (BETAPACE) 80 MG tablet TAKE 1 TABLET (80 MG TOTAL) BY MOUTH EVERY 12 (TWELVE) HOURS. 180 tablet 3   spironolactone (ALDACTONE) 25 MG tablet TAKE 1 TABLET (25 MG TOTAL) BY MOUTH DAILY. 90 tablet 3   torsemide (DEMADEX) 20 MG tablet TAKE 3 TABLETS (60 MG TOTAL) BY MOUTH EVERY MORNING AND 2 TABLETS (40 MG TOTAL) EVERY EVENING. 450 tablet 3   XARELTO 20 MG TABS tablet  TAKE 1 TABLET DAILY WITH SUPPER. 90 tablet 3   No current facility-administered medications for this encounter.    No Known Allergies    Social History   Socioeconomic History   Marital status: Single    Spouse name: Not on file   Number of children: 2   Years of education: Not on file   Highest education level: Not on file  Occupational History   Occupation: scrap metal   Occupation: disabled, odd jobs  Tobacco Use   Smoking status: Former    Packs/day: 0.50    Years: 4.00    Pack years: 2.00    Types: Cigarettes    Quit date: 10/29/2005    Years since quitting: 15.8   Smokeless tobacco: Never   Tobacco comments:    about a pack a week. social  Vaping Use   Vaping Use: Never used  Substance and Sexual Activity   Alcohol use: Yes    Comment: 12 beers per week    Drug use: Not Currently    Types: Marijuana   Sexual activity: Yes    Partners: Female  Other Topics Concern   Not on file  Social History Narrative   ** Merged History Encounter **       Social Determinants of Health   Financial Resource Strain: Low Risk    Difficulty of Paying Living Expenses: Not hard at all  Food Insecurity: No Food Insecurity   Worried About Programme researcher, broadcasting/film/video in the Last Year: Never true   Barista in the Last Year: Never true  Transportation Needs: No Transportation Needs   Lack of Transportation (Medical): No   Lack of Transportation (Non-Medical): No  Physical Activity: Insufficiently Active   Days of Exercise per Week: 2 days   Minutes of Exercise per Session: 20 min  Stress: No Stress Concern Present   Feeling of Stress : Not at all  Social Connections: Moderately Isolated   Frequency of Communication with Friends and Family: More than three times a week   Frequency of Social Gatherings with Friends and Family: More than three times a week   Attends Religious Services: 1 to 4 times per year   Active Member of [employer] or Organizations: No   Attends Tax inspector Meetings: Never   Marital Status: Divorced  Catering manager Violence: Not At Risk   Fear of Current or Ex-Partner: No   Emotionally Abused: No   Physically Abused: No   Sexually Abused: No      Family History  Problem Relation Age of Onset   Other Mother        cardiac surgery. late 1990s   Heart Problems Mother        CABG AGE 73   Congestive Heart Failure Father    Healthy Father        AGE 88  Coronary artery disease Other    Healthy Brother        AGE 57   Healthy Sister        AGE 53   Healthy Sister        AGE 54   Healthy Son    Healthy Son    Healthy Daughter     Vitals:   08/19/21 0940  BP: 98/60  Pulse: (!) 55  SpO2: 99%  Weight: (!) 145.7 kg (321 lb 3.2 oz)   Wt Readings from Last 3 Encounters:  08/19/21 (!) 145.7 kg (321 lb 3.2 oz)  05/18/21 (!) 147.9 kg (326 lb)  05/05/21 (!) 149.2 kg (329 lb)     PHYSICAL EXAM: General: NAD Neck: No JVD, no thyromegaly or thyroid nodule.  Lungs: Clear to auscultation bilaterally with normal respiratory effort. CV: Nondisplaced PMI.  Heart regular S1/S2, no S3/S4, no murmur.  No peripheral edema.  No carotid bruit.  Normal pedal pulses.  Abdomen: Soft, nontender, no hepatosplenomegaly, no distention.  Skin: Intact without lesions or rashes.  Neurologic: Alert and oriented x 3.  Psych: Normal affect. Extremities: No clubbing or cyanosis.  HEENT: Normal.   ASSESSMENT & PLAN: 1. Chronic systolic CHF: NICM, Echo 12/2016 EF 15%. Normal coronaries on cath in July 2016. Medtronic CRT-D device. ETOH vs HTN vs myocarditis as cause of cardiomyopathy. Echo 12/22 showed that EF remains 20-25%.  NYHA class II symptoms.  Weight down 5 lbs, not volume overloaded on exam.  BP on the low side but no orthostatic symptoms.  At last appointment, he was only BiV pacing 88% of the time, he has not seen EP since then.  Unable to check today as our interrogator was not working.   - Continue torsemide 60 qam/40 qpm.  BMET  today.    - Continue Entresto 49/51 twice a day, BP relatively low so will not increase today.   - Continue spironolactone 25 mg daily.  - Intolerant to Coreg in the past with hypotension, he is on sotalol and amiodarone.  - He stopped Jardiance after 3 UTIs, I will not start back.  - Discussed cutting back on ETOH as it may play a role in his cardiomyopathy.  - I would like him to see EP to try to optimize BiV pacing, will make sure he has appt.  - I would like him to have CPX done.  2. Paroxysmal atrial fibrillation: He is in NSR today.  - Continue amiodarone 200 mg daily.  Check LFTs/TSH today.  Should have regular eye exam.  - Continue Xarelto 20 mg daily.  CBC today.  3. ETOH abuse: Suspect this contributes to cardiomyopathy.  Still drinking moderately, I again encouraged him to cut back. .   4. Tobacco abuse: No longer smoking.   5. VT: Followed closely by EP, he is now on amiodarone and sotalol (QTc ok at 480 msec today).  6. OSA: Use CPAP.   Followup 3 mos with APP.   Marca Ancona, MD 08/21/21

## 2021-08-22 ENCOUNTER — Ambulatory Visit (INDEPENDENT_AMBULATORY_CARE_PROVIDER_SITE_OTHER): Payer: Medicare HMO

## 2021-08-22 DIAGNOSIS — I5022 Chronic systolic (congestive) heart failure: Secondary | ICD-10-CM | POA: Diagnosis not present

## 2021-08-22 DIAGNOSIS — I428 Other cardiomyopathies: Secondary | ICD-10-CM | POA: Diagnosis not present

## 2021-08-22 DIAGNOSIS — Z95810 Presence of automatic (implantable) cardiac defibrillator: Secondary | ICD-10-CM | POA: Diagnosis not present

## 2021-08-23 ENCOUNTER — Telehealth: Payer: Self-pay

## 2021-08-23 LAB — CUP PACEART REMOTE DEVICE CHECK
Battery Remaining Longevity: 4 mo
Battery Voltage: 2.81 V
Brady Statistic AP VP Percent: 8.73 %
Brady Statistic AP VS Percent: 13.53 %
Brady Statistic AS VP Percent: 74.6 %
Brady Statistic AS VS Percent: 3.14 %
Brady Statistic RA Percent Paced: 21.16 %
Brady Statistic RV Percent Paced: 80.68 %
Date Time Interrogation Session: 20221202111826
HighPow Impedance: 65 Ohm
Implantable Lead Implant Date: 20160727
Implantable Lead Implant Date: 20160727
Implantable Lead Implant Date: 20160727
Implantable Lead Location: 753858
Implantable Lead Location: 753859
Implantable Lead Location: 753860
Implantable Lead Model: 4598
Implantable Lead Model: 5076
Implantable Lead Model: 6935
Implantable Pulse Generator Implant Date: 20160727
Lead Channel Impedance Value: 342 Ohm
Lead Channel Impedance Value: 361 Ohm
Lead Channel Impedance Value: 399 Ohm
Lead Channel Impedance Value: 399 Ohm
Lead Channel Impedance Value: 399 Ohm
Lead Channel Impedance Value: 418 Ohm
Lead Channel Impedance Value: 456 Ohm
Lead Channel Impedance Value: 475 Ohm
Lead Channel Impedance Value: 589 Ohm
Lead Channel Impedance Value: 608 Ohm
Lead Channel Impedance Value: 646 Ohm
Lead Channel Impedance Value: 646 Ohm
Lead Channel Impedance Value: 665 Ohm
Lead Channel Pacing Threshold Amplitude: 0.5 V
Lead Channel Pacing Threshold Amplitude: 0.625 V
Lead Channel Pacing Threshold Amplitude: 1.625 V
Lead Channel Pacing Threshold Pulse Width: 0.4 ms
Lead Channel Pacing Threshold Pulse Width: 0.4 ms
Lead Channel Pacing Threshold Pulse Width: 0.6 ms
Lead Channel Sensing Intrinsic Amplitude: 1.875 mV
Lead Channel Sensing Intrinsic Amplitude: 1.875 mV
Lead Channel Sensing Intrinsic Amplitude: 10 mV
Lead Channel Sensing Intrinsic Amplitude: 10 mV
Lead Channel Setting Pacing Amplitude: 0.5 V
Lead Channel Setting Pacing Amplitude: 1.5 V
Lead Channel Setting Pacing Amplitude: 3 V
Lead Channel Setting Pacing Pulse Width: 0.03 ms
Lead Channel Setting Pacing Pulse Width: 0.6 ms
Lead Channel Setting Sensing Sensitivity: 0.3 mV
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
## 2021-08-24 NOTE — Progress Notes (Signed)
EPIC Encounter for ICM Monitoring  Patient Name: [name] [name] is a 60 y.o. male Date: 08/24/2021 Primary Care Physican: [name], NP Primary Cardiologist: Shirlee Latch Electrophysiologist: Joycelyn Schmid Pacing: 80.7% 08/19/2021 Weight: 321 lbs  Battery Longevity: 4 months   Transmission reviewed.     Optivol Thoracic impedance suggesting normal fluid levels.    Prescribed:  Torsemide 20 mg 3 tablets (60 mg total) every morning and 2 tablets (40 mg total) every evening. Spironolactone 25 mg take 1 tablet daily.   Labs: 08/19/2021 Creatinine 1.18, BUN 15, Potassium 3.9, Sodium 138, GFR >60 05/30/2021 Creatinine 1.37, BUN 21, Potassium 3.8, Sodium 139, GFR 60  05/18/2021 Creatinine 1.31, BUN 18, Potassium 4.4, Sodium 138, GFR >60  A complete set of results can be found in Results Review.   Recommendations:  No changes.   Follow-up plan: ICM clinic phone appointment on 09/26/2021.   91 day device clinic remote transmission 11/21/2021.     EP/Cardiology Office Visits:  09/26/2021 with Dr Graciela Husbands.     Copy of ICM check sent to Dr. Graciela Husbands.   3 month ICM trend: 08/19/2021.    12-14 Month ICM trend:       [name], RN 08/24/2021 9:58 AM

## 2021-08-26 ENCOUNTER — Other Ambulatory Visit (HOSPITAL_COMMUNITY): Payer: Self-pay | Admitting: *Deleted

## 2021-08-26 MED ORDER — ENTRESTO 49-51 MG PO TABS: 1.0000 | ORAL_TABLET | Freq: Two times a day (BID) | ORAL | 3 refills | Status: DC

## 2021-08-31 NOTE — Progress Notes (Signed)
Remote ICD transmission.   

## 2021-09-06 NOTE — Telephone Encounter (Signed)
Sent in [place] application via fax 12/09.  Will follow up.

## 2021-09-21 ENCOUNTER — Ambulatory Visit (HOSPITAL_COMMUNITY): Payer: Medicare HMO | Attending: Cardiology

## 2021-09-21 ENCOUNTER — Other Ambulatory Visit: Payer: Self-pay

## 2021-09-21 DIAGNOSIS — Z95810 Presence of automatic (implantable) cardiac defibrillator: Secondary | ICD-10-CM | POA: Insufficient documentation

## 2021-09-21 DIAGNOSIS — I428 Other cardiomyopathies: Secondary | ICD-10-CM | POA: Insufficient documentation

## 2021-09-21 DIAGNOSIS — Z87891 Personal history of nicotine dependence: Secondary | ICD-10-CM | POA: Diagnosis not present

## 2021-09-21 DIAGNOSIS — G4733 Obstructive sleep apnea (adult) (pediatric): Secondary | ICD-10-CM | POA: Insufficient documentation

## 2021-09-21 DIAGNOSIS — I5022 Chronic systolic (congestive) heart failure: Secondary | ICD-10-CM | POA: Insufficient documentation

## 2021-09-21 DIAGNOSIS — Z8616 Personal history of COVID-19: Secondary | ICD-10-CM | POA: Diagnosis not present

## 2021-09-21 DIAGNOSIS — I480 Paroxysmal atrial fibrillation: Secondary | ICD-10-CM | POA: Diagnosis not present

## 2021-09-21 DIAGNOSIS — Z9989 Dependence on other enabling machines and devices: Secondary | ICD-10-CM | POA: Diagnosis not present

## 2021-09-21 DIAGNOSIS — J449 Chronic obstructive pulmonary disease, unspecified: Secondary | ICD-10-CM | POA: Diagnosis not present

## 2021-09-21 DIAGNOSIS — F1010 Alcohol abuse, uncomplicated: Secondary | ICD-10-CM | POA: Diagnosis not present

## 2021-09-26 ENCOUNTER — Ambulatory Visit (INDEPENDENT_AMBULATORY_CARE_PROVIDER_SITE_OTHER): Payer: Medicare HMO | Admitting: Internal Medicine

## 2021-09-26 ENCOUNTER — Encounter: Payer: Self-pay | Admitting: Internal Medicine

## 2021-09-26 ENCOUNTER — Ambulatory Visit (INDEPENDENT_AMBULATORY_CARE_PROVIDER_SITE_OTHER): Payer: Self-pay

## 2021-09-26 ENCOUNTER — Other Ambulatory Visit: Payer: Self-pay

## 2021-09-26 VITALS — BP 98/64 | HR 68 | Ht 75.0 in | Wt 321.0 lb

## 2021-09-26 DIAGNOSIS — I4891 Unspecified atrial fibrillation: Secondary | ICD-10-CM | POA: Diagnosis not present

## 2021-09-26 DIAGNOSIS — I428 Other cardiomyopathies: Secondary | ICD-10-CM

## 2021-09-26 DIAGNOSIS — I5022 Chronic systolic (congestive) heart failure: Secondary | ICD-10-CM | POA: Diagnosis not present

## 2021-09-26 DIAGNOSIS — I472 Ventricular tachycardia, unspecified: Secondary | ICD-10-CM

## 2021-09-26 DIAGNOSIS — J449 Chronic obstructive pulmonary disease, unspecified: Secondary | ICD-10-CM | POA: Diagnosis not present

## 2021-09-26 DIAGNOSIS — Z95810 Presence of automatic (implantable) cardiac defibrillator: Secondary | ICD-10-CM | POA: Diagnosis not present

## 2021-09-26 DIAGNOSIS — Z6841 Body Mass Index (BMI) 40.0 and over, adult: Secondary | ICD-10-CM | POA: Diagnosis not present

## 2021-09-26 NOTE — Progress Notes (Signed)
Pt had office defib check by Dr Graciela Husbands today, 09/26/2021.  ICM remote transmission rescheduled for 10/18/2021.

## 2021-09-26 NOTE — Patient Instructions (Addendum)
Medication Instructions:  Your physician recommends that you continue on your current medications as directed. Please refer to the Current Medication list given to you today.  *If you need a refill on your cardiac medications before your next appointment, please call your pharmacy*   Lab Work: None ordered.  If you have labs (blood work) drawn today and your tests are completely normal, you will receive your results only by: [place] (if you have MyChart) OR A paper copy in the mail If you have any lab test that is abnormal or we need to change your treatment, we will call you to review the results.   Testing/Procedures: Your device will need a battery generator change in a few months.  Let us know if you are alerted or will we contact you once we see you have reached replacement recommendation.    Follow-Up: At [facility], you and your health needs are our priority.  As part of our continuing mission to provide you with exceptional heart care, we have created designated Provider Care Teams.  These Care Teams include your primary Cardiologist (physician) and Advanced Practice Providers (APPs -  Physician Assistants and Nurse Practitioners) who all work together to provide you with the care you need, when you need it.  We recommend signing up for the patient portal called "MyChart".  Sign up information is provided on this After Visit Summary.  MyChart is used to connect with patients for Virtual Visits (Telemedicine).  Patients are able to view lab/test results, encounter notes, upcoming appointments, etc.  Non-urgent messages can be sent to your provider as well.   To learn more about what you can do with MyChart, go to [email or web address].    Your next appointment:   6 months with Dr Caryl Comes.    Dr Caryl Comes recommends you purchase a Carpal Tunnel Splint

## 2021-09-26 NOTE — Progress Notes (Signed)
10 months 3 CRT-D      Patient Care Team: Camillia Herter, NP as PCP - General (Nurse Practitioner) Jolaine Artist, MD as PCP - Advanced Heart Failure (Cardiology) Deboraha Sprang, MD as PCP - Electrophysiology (Cardiology) Larey Dresser, MD as PCP - Cardiology (Cardiology) [name], MD (Family Medicine) Jorge Ny, LCSW as Social Worker (Licensed Clinical Social Worker)   HPI  [name] [name] is a 61 y.o. 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  When I last saw him 3/21 in hospital reprogrammed LV subtherapeutic with a QRS duration of 140 ms  He also has a history of atrial fibrillation and polymorphic ventricular tachycardia for which amiodarone therapy was initiated. Potassium levels were low at that time   Recurrent ventricular tachycardia 9/18 potassium levels were normal at that time.   With failed shocks and amiodarone was continued and sotalol was initiated.  Hospitalized 3/21 his device was reprogrammed.  Unfortunately there is a discordance between my notes and the finding, my notes reporting that LV lead was turned off and the device programming is that RV lead is subthreshold.  Date Cr K Mg TSH LFTs PFTs  9/18  1.02 3.8 2.3        11/18     0.860 36    12/22 1.18 3.9 2.3 (5/22) 1.085 16     DATE TEST EF   12/21 Echo 20-25 % LV global hypokinesis and hypertrophy, G1 diastolic dysfunction  [id number] Echo 20-25%           Today, he has been doing well. He has lost weight and his shortness of breath has improved. He has stopped drinking alcohol during the week and cut back on sugar. However, when he wakes up. He feels a tingling sensation localized to his bilateral hands. He wonders if the tingling sensation is related to dehydration. EKG is office shows frequent PVCs. However, he denies feeling  these PVCs. He regularly uses his BiPAP machine.   The patient denies chest pain, shortness of breath, nocturnal dyspnea, orthopnea or peripheral edema.  There have been no lightheadedness or syncope.  Complains of bilateral hand tingling and dropping things  Past Medical History:  Diagnosis Date   AICD (automatic cardioverter/defibrillator) present    Arthritis    Atrial fibrillation (HCC)    CAD (coronary artery disease)    Mild nonobstructive (Cath 09)   Chronic systolic heart failure (HCC)    Nonischemic CM: echo 4/12 with EF 25-30%, grade 2 diast dysfxn, mild dilated aortic root 43 mm, trivial MR, mod LAE   COPD (chronic obstructive pulmonary disease) (HCC)    Dyspnea    with exertion    Ejection fraction < 50% 08/2019   20-25% noted on ECHO   H/O ETOH abuse    HTN (hypertension)    Hypersomnia    Nonischemic cardiomyopathy (HCC)    Obesity, morbid (HCC)    OSA on CPAP    Systolic and diastolic CHF, acute on chronic ([place]) 11/2016   Tobacco use disorder    Ventricular tachycardia     Past Surgical History:  Procedure Laterality Date   CARDIAC CATHETERIZATION N/A 04/08/2015   Procedure: Right/Left Heart Cath and Coronary Angiography;  Surgeon: Jettie Booze, MD;  Location: [place] [facility];  Service: Cardiovascular;  Laterality: N/A;   EP IMPLANTABLE DEVICE N/A 04/14/2015  Procedure: BiV ICD Insertion CRT-D;  Surgeon: Deboraha Sprang, MD;  Location: [place] [facility];  Service: Cardiovascular;  Laterality: N/A;   HYDROCELE EXCISION / REPAIR     ICD IMPLANT     TOTAL HIP ARTHROPLASTY Right 11/04/2018   Procedure: TOTAL HIP ARTHROPLASTY ANTERIOR APPROACH;  Surgeon: Dorna Leitz, MD;  Location: [facility];  Service: Orthopedics;  Laterality: Right;   TOTAL KNEE ARTHROPLASTY Right 01/30/2020   Procedure: TOTAL KNEE ARTHROPLASTY;  Surgeon: Dorna Leitz, MD;  Location: [facility];  Service: Orthopedics;  Laterality: Right;   TOTAL KNEE ARTHROPLASTY Left 07/30/2020   Procedure:  TOTAL KNEE ARTHROPLASTY;  Surgeon: Dorna Leitz, MD;  Location: [facility];  Service: Orthopedics;  Laterality: Left;   TUMOR EXCISION Left    TUMOR EXCISION Left 1982   Benign 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 18 g 3   amiodarone (PACERONE) 200 MG tablet Take 1 tablet (200 mg total) by mouth daily. 180 tablet 3   Ascorbic Acid (VITAMIN C) 100 MG tablet Take 100 mg by mouth daily.     atorvastatin (LIPITOR) 80 MG tablet TAKE 1 TABLET (80 MG TOTAL) BY MOUTH EVERY EVENING. 90 tablet 3   sacubitril-valsartan (ENTRESTO) 49-51 MG Take 1 tablet by mouth 2 (two) times daily. 180 tablet 3   sotalol (BETAPACE) 80 MG tablet TAKE 1 TABLET (80 MG TOTAL) BY MOUTH EVERY 12 (TWELVE) HOURS. 180 tablet 3   spironolactone (ALDACTONE) 25 MG tablet TAKE 1 TABLET (25 MG TOTAL) BY MOUTH DAILY. 90 tablet 3   torsemide (DEMADEX) 20 MG tablet TAKE 3 TABLETS (60 MG TOTAL) BY MOUTH EVERY MORNING AND 2 TABLETS (40 MG TOTAL) EVERY EVENING. 450 tablet 3   XARELTO 20 MG TABS tablet TAKE 1 TABLET DAILY WITH SUPPER. 90 tablet 3   No current facility-administered medications for this visit.    No Known Allergies    Review of Systems negative except from HPI and PMH  Physical Exam BP 98/64    Pulse 68    Ht 6\' 3"  (1.905 m)    Wt (!) 321 lb (145.6 kg)    SpO2 97%    BMI 40.12 kg/m  Well developed and well nourished in no acute distress HENT normal Neck supple with JVP-flat Clear Device pocket well healed; without hematoma or erythema.  There is no tethering  Regular rate and rhythm, no  gallop No  murmur Abd-soft with active BS No Clubbing cyanosis  edema Skin-warm and dry A & Oriented thenar wasting right greater than left and weakness on thumb opposition right greater than left   ECG Sinus  with P-synchronous/ AV  pacing  Freq PVC QRS upright lead 1 and neg V1  12/18 QRS Rs V1 and negative lead I QRSd approximately 200 ms  1/20 QRS upright lead I and negative lead V1 QRSd 158 ms 12/20 QRS negative lead I and upright V1 QRSd 218 ms 12/21 QRS negative lead V1 upright lead I QRSd 142 ms 8/22 QRS negative lead V1 upright lead I QRSd 140 ms  Assessment and  Plan  Nonischemic cardiomyopathy  Congestive heart failure-chronic-systolic  Ventricular tachycardia-polymorphic  PVCs  Atrial fibrillation-paroxysmal  Amiodarone therapy for the above  Sinus node dysfunction with chronotropic incompetence  Carpal  tunnel   Ventricular ectopy continues to be an issue.  And this despite amiodarone and sotalol.  We will continue these at 200 mg a day for the former and 80 twice daily for the latter.  Surveillance laboratories were normal; if that number continues to increase, we could either consider catheter ablation or an adjunctive use of ranolazine  No interval ventricular tachycardia.  Continue the amiodarone and the sotalol  Euvolemic.  With his cardiomyopathy we will continue the Entresto 49/51 spironolactone 25.  His device is approaching ERI.  As noted there is a discordance between the programming in my last note.  We will leave things unchanged for now  Has carpal tunnel with bilateral right greater than left thenar wasting and weakness.  I suggested a carpal tunnel splint and then to follow-up with his PCP.  We also have to keep in mind the possibility of in the context of his nonischemic cardiomyopathy some kind of infiltrative disease like amyloid that could contribute to both  No interval atrial fibrillation of which we are aware.  Continue Xarelto 20 mg twice daily      I,Mykaella Javier,acting as a scribe for Virl Axe, MD.,have documented all relevant documentation on the behalf of Virl Axe, MD,as directed by  Virl Axe, MD while in the presence of  Virl Axe, MD.  I, Virl Axe, MD, have reviewed all documentation for this visit. The documentation on 09/26/21 for the exam, diagnosis, procedures, and orders are all accurate and complete.

## 2021-09-27 ENCOUNTER — Other Ambulatory Visit (HOSPITAL_COMMUNITY): Payer: Self-pay | Admitting: Cardiology

## 2021-09-27 LAB — CUP PACEART REMOTE DEVICE CHECK
Battery Remaining Longevity: 3 mo
Battery Voltage: 2.79 V
Brady Statistic AP VP Percent: 0.27 %
Brady Statistic AP VS Percent: 1.99 %
Brady Statistic AS VP Percent: 96.6 %
Brady Statistic AS VS Percent: 1.14 %
Brady Statistic RA Percent Paced: 2.24 %
Brady Statistic RV Percent Paced: 96.79 %
Date Time Interrogation Session: 20230109223626
HighPow Impedance: 67 Ohm
Implantable Lead Implant Date: 20160727
Implantable Lead Implant Date: 20160727
Implantable Lead Implant Date: 20160727
Implantable Lead Location: 753858
Implantable Lead Location: 753859
Implantable Lead Location: 753860
Implantable Lead Model: 4598
Implantable Lead Model: 5076
Implantable Lead Model: 6935
Implantable Pulse Generator Implant Date: 20160727
Lead Channel Impedance Value: 342 Ohm
Lead Channel Impedance Value: 361 Ohm
Lead Channel Impedance Value: 399 Ohm
Lead Channel Impedance Value: 399 Ohm
Lead Channel Impedance Value: 399 Ohm
Lead Channel Impedance Value: 418 Ohm
Lead Channel Impedance Value: 456 Ohm
Lead Channel Impedance Value: 456 Ohm
Lead Channel Impedance Value: 551 Ohm
Lead Channel Impedance Value: 589 Ohm
Lead Channel Impedance Value: 608 Ohm
Lead Channel Impedance Value: 646 Ohm
Lead Channel Impedance Value: 646 Ohm
Lead Channel Pacing Threshold Amplitude: 0.375 V
Lead Channel Pacing Threshold Amplitude: 0.625 V
Lead Channel Pacing Threshold Amplitude: 1.625 V
Lead Channel Pacing Threshold Pulse Width: 0.4 ms
Lead Channel Pacing Threshold Pulse Width: 0.4 ms
Lead Channel Pacing Threshold Pulse Width: 0.6 ms
Lead Channel Sensing Intrinsic Amplitude: 3.875 mV
Lead Channel Sensing Intrinsic Amplitude: 4.75 mV
Lead Channel Sensing Intrinsic Amplitude: 8.125 mV
Lead Channel Sensing Intrinsic Amplitude: 8.375 mV
Lead Channel Setting Pacing Amplitude: 0.5 V
Lead Channel Setting Pacing Amplitude: 1.5 V
Lead Channel Setting Pacing Amplitude: 3 V
Lead Channel Setting Pacing Pulse Width: 0.03 ms
Lead Channel Setting Pacing Pulse Width: 0.6 ms
Lead Channel Setting Sensing Sensitivity: 0.3 mV

## 2021-09-29 ENCOUNTER — Telehealth (HOSPITAL_COMMUNITY): Payer: Self-pay | Admitting: Pharmacist

## 2021-09-29 LAB — CUP PACEART INCLINIC DEVICE CHECK
Battery Remaining Longevity: 3 mo
Battery Voltage: 2.8 V
Brady Statistic AP VP Percent: 15.66 %
Brady Statistic AP VS Percent: 4.36 %
Brady Statistic AS VP Percent: 79.08 %
Brady Statistic AS VS Percent: 0.91 %
Brady Statistic RA Percent Paced: 19.8 %
Brady Statistic RV Percent Paced: 93.85 %
Date Time Interrogation Session: 20230109112500
HighPow Impedance: 59 Ohm
Implantable Lead Implant Date: 20160727
Implantable Lead Implant Date: 20160727
Implantable Lead Implant Date: 20160727
Implantable Lead Location: 753858
Implantable Lead Location: 753859
Implantable Lead Location: 753860
Implantable Lead Model: 4598
Implantable Lead Model: 5076
Implantable Lead Model: 6935
Implantable Pulse Generator Implant Date: 20160727
Lead Channel Impedance Value: 361 Ohm
Lead Channel Impedance Value: 361 Ohm
Lead Channel Impedance Value: 361 Ohm
Lead Channel Impedance Value: 361 Ohm
Lead Channel Impedance Value: 399 Ohm
Lead Channel Impedance Value: 399 Ohm
Lead Channel Impedance Value: 418 Ohm
Lead Channel Impedance Value: 456 Ohm
Lead Channel Impedance Value: 589 Ohm
Lead Channel Impedance Value: 608 Ohm
Lead Channel Impedance Value: 608 Ohm
Lead Channel Impedance Value: 646 Ohm
Lead Channel Impedance Value: 665 Ohm
Lead Channel Pacing Threshold Amplitude: 0.375 V
Lead Channel Pacing Threshold Amplitude: 0.625 V
Lead Channel Pacing Threshold Amplitude: 1.625 V
Lead Channel Pacing Threshold Pulse Width: 0.4 ms
Lead Channel Pacing Threshold Pulse Width: 0.4 ms
Lead Channel Pacing Threshold Pulse Width: 0.6 ms
Lead Channel Sensing Intrinsic Amplitude: 3.875 mV
Lead Channel Sensing Intrinsic Amplitude: 4.75 mV
Lead Channel Sensing Intrinsic Amplitude: 8.125 mV
Lead Channel Sensing Intrinsic Amplitude: 8.375 mV
Lead Channel Setting Pacing Amplitude: 0.5 V
Lead Channel Setting Pacing Amplitude: 1.5 V
Lead Channel Setting Pacing Amplitude: 3 V
Lead Channel Setting Pacing Pulse Width: 0.03 ms
Lead Channel Setting Pacing Pulse Width: 0.6 ms
Lead Channel Setting Sensing Sensitivity: 0.3 mV

## 2021-09-29 NOTE — Telephone Encounter (Signed)
Advanced Heart Failure Patient Advocate Encounter   Patient was approved to receive Entresto from [employer].   Patient ID: [id number] Effective dates: 09/18/21 through 09/17/22  Karle Plumber, PharmD, BCPS, BCCP, CPP Heart Failure Clinic Pharmacist [phone]

## 2021-10-01 DIAGNOSIS — N3091 Cystitis, unspecified with hematuria: Secondary | ICD-10-CM | POA: Diagnosis not present

## 2021-10-01 DIAGNOSIS — N3000 Acute cystitis without hematuria: Secondary | ICD-10-CM | POA: Diagnosis not present

## 2021-10-04 NOTE — Addendum Note (Signed)
Addended by: Elease Etienne A on: 10/04/2021 12:28 PM   Modules accepted: Level of Service

## 2021-10-04 NOTE — Progress Notes (Signed)
Remote ICD transmission.   

## 2021-10-18 ENCOUNTER — Ambulatory Visit (INDEPENDENT_AMBULATORY_CARE_PROVIDER_SITE_OTHER): Payer: Medicare HMO

## 2021-10-18 DIAGNOSIS — I5022 Chronic systolic (congestive) heart failure: Secondary | ICD-10-CM | POA: Diagnosis not present

## 2021-10-18 DIAGNOSIS — Z95810 Presence of automatic (implantable) cardiac defibrillator: Secondary | ICD-10-CM | POA: Diagnosis not present

## 2021-10-19 NOTE — Progress Notes (Signed)
EPIC Encounter for ICM Monitoring  Patient Name: [name] [name] is a 61 y.o. male Date: 10/19/2021 Primary Care Physican: [name], NP Primary Cardiologist: Shirlee Latch Electrophysiologist: Joycelyn Schmid Pacing: 92.3% 10/19/2021 Weight: 321 lbs   Battery Longevity: 3 months   Spoke with patient and heart failure questions reviewed.  Pt asymptomatic for fluid accumulation.  Reports feeling well at this time and voices no complaints.    Optivol Thoracic impedance suggesting normal fluid levels.    Prescribed:  Torsemide 20 mg 3 tablets (60 mg total) every morning and 2 tablets (40 mg total) every evening. Spironolactone 25 mg take 1 tablet daily.   Labs: 08/19/2021 Creatinine 1.18, BUN 15, Potassium 3.9, Sodium 138, GFR >60 05/30/2021 Creatinine 1.37, BUN 21, Potassium 3.8, Sodium 139, GFR 60  05/18/2021 Creatinine 1.31, BUN 18, Potassium 4.4, Sodium 138, GFR >60  A complete set of results can be found in Results Review.   Recommendations:  No changes and encouraged to call if experiencing any fluid symptoms.   Follow-up plan: ICM clinic phone appointment on 11/22/2021.   91 day device clinic remote transmission 11/21/2021.     EP/Cardiology Office Visits:  09/26/2021 with Dr Graciela Husbands.     Copy of ICM check sent to Dr. Graciela Husbands.   3 month ICM trend: 10/18/2021.    12-14 Month ICM trend:     [name], RN 10/19/2021 1:51 PM

## 2021-10-28 ENCOUNTER — Ambulatory Visit (INDEPENDENT_AMBULATORY_CARE_PROVIDER_SITE_OTHER): Payer: Medicare HMO

## 2021-10-28 DIAGNOSIS — I428 Other cardiomyopathies: Secondary | ICD-10-CM

## 2021-10-29 LAB — CUP PACEART REMOTE DEVICE CHECK
Battery Remaining Longevity: 2 mo
Battery Voltage: 2.76 V
Brady Statistic AP VP Percent: 14.06 %
Brady Statistic AP VS Percent: 4.63 %
Brady Statistic AS VP Percent: 79.8 %
Brady Statistic AS VS Percent: 1.51 %
Brady Statistic RA Percent Paced: 18.49 %
Brady Statistic RV Percent Paced: 92.74 %
Date Time Interrogation Session: 20230210012404
HighPow Impedance: 69 Ohm
Implantable Lead Implant Date: 20160727
Implantable Lead Implant Date: 20160727
Implantable Lead Implant Date: 20160727
Implantable Lead Location: 753858
Implantable Lead Location: 753859
Implantable Lead Location: 753860
Implantable Lead Model: 4598
Implantable Lead Model: 5076
Implantable Lead Model: 6935
Implantable Pulse Generator Implant Date: 20160727
Lead Channel Impedance Value: 361 Ohm
Lead Channel Impedance Value: 361 Ohm
Lead Channel Impedance Value: 361 Ohm
Lead Channel Impedance Value: 399 Ohm
Lead Channel Impedance Value: 399 Ohm
Lead Channel Impedance Value: 418 Ohm
Lead Channel Impedance Value: 456 Ohm
Lead Channel Impedance Value: 456 Ohm
Lead Channel Impedance Value: 589 Ohm
Lead Channel Impedance Value: 589 Ohm
Lead Channel Impedance Value: 608 Ohm
Lead Channel Impedance Value: 646 Ohm
Lead Channel Impedance Value: 646 Ohm
Lead Channel Pacing Threshold Amplitude: 0.5 V
Lead Channel Pacing Threshold Amplitude: 0.625 V
Lead Channel Pacing Threshold Amplitude: 1.625 V
Lead Channel Pacing Threshold Pulse Width: 0.4 ms
Lead Channel Pacing Threshold Pulse Width: 0.4 ms
Lead Channel Pacing Threshold Pulse Width: 0.6 ms
Lead Channel Sensing Intrinsic Amplitude: 2.375 mV
Lead Channel Sensing Intrinsic Amplitude: 2.375 mV
Lead Channel Sensing Intrinsic Amplitude: 3.875 mV
Lead Channel Sensing Intrinsic Amplitude: 3.875 mV
Lead Channel Setting Pacing Amplitude: 0.5 V
Lead Channel Setting Pacing Amplitude: 1.5 V
Lead Channel Setting Pacing Amplitude: 3 V
Lead Channel Setting Pacing Pulse Width: 0.03 ms
Lead Channel Setting Pacing Pulse Width: 0.6 ms
Lead Channel Setting Sensing Sensitivity: 0.3 mV

## 2021-11-01 NOTE — Progress Notes (Signed)
Remote ICD transmission.   

## 2021-11-01 NOTE — Addendum Note (Signed)
Addended by: Elease Etienne A on: 11/01/2021 03:20 PM   Modules accepted: Level of Service

## 2021-11-09 DIAGNOSIS — N3001 Acute cystitis with hematuria: Secondary | ICD-10-CM | POA: Diagnosis not present

## 2021-11-09 DIAGNOSIS — N390 Urinary tract infection, site not specified: Secondary | ICD-10-CM | POA: Diagnosis not present

## 2021-11-21 ENCOUNTER — Ambulatory Visit (INDEPENDENT_AMBULATORY_CARE_PROVIDER_SITE_OTHER): Payer: Medicare HMO

## 2021-11-21 DIAGNOSIS — I428 Other cardiomyopathies: Secondary | ICD-10-CM

## 2021-11-22 ENCOUNTER — Ambulatory Visit (INDEPENDENT_AMBULATORY_CARE_PROVIDER_SITE_OTHER): Payer: Medicare HMO

## 2021-11-22 DIAGNOSIS — Z95810 Presence of automatic (implantable) cardiac defibrillator: Secondary | ICD-10-CM | POA: Diagnosis not present

## 2021-11-22 DIAGNOSIS — I5022 Chronic systolic (congestive) heart failure: Secondary | ICD-10-CM | POA: Diagnosis not present

## 2021-11-22 LAB — CUP PACEART REMOTE DEVICE CHECK
Battery Remaining Longevity: 1 mo
Battery Voltage: 2.77 V
Brady Statistic AP VP Percent: 17.37 %
Brady Statistic AP VS Percent: 4.19 %
Brady Statistic AS VP Percent: 77.35 %
Brady Statistic AS VS Percent: 1.09 %
Brady Statistic RA Percent Paced: 21.38 %
Brady Statistic RV Percent Paced: 93.9 %
Date Time Interrogation Session: 20230306022823
HighPow Impedance: 70 Ohm
Implantable Lead Implant Date: 20160727
Implantable Lead Implant Date: 20160727
Implantable Lead Implant Date: 20160727
Implantable Lead Location: 753858
Implantable Lead Location: 753859
Implantable Lead Location: 753860
Implantable Lead Model: 4598
Implantable Lead Model: 5076
Implantable Lead Model: 6935
Implantable Pulse Generator Implant Date: 20160727
Lead Channel Impedance Value: 361 Ohm
Lead Channel Impedance Value: 399 Ohm
Lead Channel Impedance Value: 418 Ohm
Lead Channel Impedance Value: 418 Ohm
Lead Channel Impedance Value: 418 Ohm
Lead Channel Impedance Value: 475 Ohm
Lead Channel Impedance Value: 475 Ohm
Lead Channel Impedance Value: 475 Ohm
Lead Channel Impedance Value: 608 Ohm
Lead Channel Impedance Value: 646 Ohm
Lead Channel Impedance Value: 646 Ohm
Lead Channel Impedance Value: 665 Ohm
Lead Channel Impedance Value: 703 Ohm
Lead Channel Pacing Threshold Amplitude: 0.5 V
Lead Channel Pacing Threshold Amplitude: 0.625 V
Lead Channel Pacing Threshold Amplitude: 1.625 V
Lead Channel Pacing Threshold Pulse Width: 0.4 ms
Lead Channel Pacing Threshold Pulse Width: 0.4 ms
Lead Channel Pacing Threshold Pulse Width: 0.6 ms
Lead Channel Sensing Intrinsic Amplitude: 2.125 mV
Lead Channel Sensing Intrinsic Amplitude: 2.125 mV
Lead Channel Sensing Intrinsic Amplitude: 7.875 mV
Lead Channel Sensing Intrinsic Amplitude: 7.875 mV
Lead Channel Setting Pacing Amplitude: 0.5 V
Lead Channel Setting Pacing Amplitude: 1.5 V
Lead Channel Setting Pacing Amplitude: 3 V
Lead Channel Setting Pacing Pulse Width: 0.03 ms
Lead Channel Setting Pacing Pulse Width: 0.6 ms
Lead Channel Setting Sensing Sensitivity: 0.3 mV

## 2021-11-22 NOTE — Progress Notes (Signed)
EPIC Encounter for ICM Monitoring ? ?Patient Name: [name] [name] is a 61 y.o. male ?Date: 11/22/2021 ?Primary Care Physican: [name], NP ?Primary Cardiologist: Shirlee Latch ?Electrophysiologist: Graciela Husbands ?Bi-V Pacing: 93.9% ?10/19/2021 Weight: 321 lbs ? ?VT-NS (>4 beats, >176 bpm) 3 ?  ?Battery Longevity: 3 months ?  ?Spoke with patient and heart failure questions reviewed.  Pt asymptomatic for fluid accumulation.  Reports feeling well at this time and voices no complaints.  ?  ?Optivol Thoracic impedance suggesting normal fluid levels. ?   ?Prescribed:  ?Torsemide 20 mg 3 tablets (60 mg total) every morning and 2 tablets (40 mg total) every evening. ?Spironolactone 25 mg take 1 tablet daily. ?  ?Labs: ?08/19/2021 Creatinine 1.18, BUN 15, Potassium 3.9, Sodium 138, GFR >60 ?05/30/2021 Creatinine 1.37, BUN 21, Potassium 3.8, Sodium 139, GFR 60  ?05/18/2021 Creatinine 1.31, BUN 18, Potassium 4.4, Sodium 138, GFR >60  ?A complete set of results can be found in Results Review. ?  ?Recommendations:  No changes and encouraged to call if experiencing any fluid symptoms. ?  ?Follow-up plan: ICM clinic phone appointment on 12/26/2021.   91 day device clinic remote transmission 02/20/2022.   ?  ?EP/Cardiology Office Visits:  12/01/2021 with [facility] Failure [facility].   03/25/2022 with Dr Graciela Husbands.   ?  ?Copy of ICM check sent to Dr. Graciela Husbands.  ? ?3 month ICM trend: 11/21/2021. ? ? ? ?12-14 Month ICM trend:  ? ? ? ?[name], RN ?11/22/2021 ?3:30 PM ? ?

## 2021-11-29 NOTE — Progress Notes (Signed)
Remote ICD transmission.   

## 2021-11-30 NOTE — Progress Notes (Signed)
?Advanced Heart Failure Clinic Note  ? ?Primary Care: Dr. Claris Gower, MD ?HF Cardiology: Dr. Aundra Dubin ? ?HPI: [name] is a 61 y.o. male with a past medical history of chronic systolic CHF (EF 0000000 in April 2018), dilated NICM s/p BiV ICD, PAF with intermittent compliance with Xarelto, ETOH abuse, tobacco abuse, OSA on CPAP, and HTN.  ? ?Admitted 12/25/16-12/27/16 with acute on chronic systolic CHF, volume overload. He was diuresed 11 pounds on IV lasix.  Discharge weight was 294 pounds. It was felt that the etiology of his NICM was ETOH vs hypertension related.  ? ?Admitted 5/14-5/16/18 with volume overload, acute respiratory failure requiring Bipap. He was not taking his Entresto as he was confused about dosing and there was a mix up his pharmacy. He was diuresed with IV lasix, discharge weight was 295 pounds.  ? ?Admitted 05/30/17-06/02/17 with VT, ICD shock x 4. He is now on amiodarone and sotalol. ? ?Echo in 12/20 showed EF 20-25%, severe LV dilation, moderate LVH, normal RV, small IVC.  ? ?He was admitted in 3/21 with ICD shock in setting of hypokalemia and heavy ETOH. Entresto was decreased with low BP.  He was diuresed due to volume overload.  ? ?He had left TKR in [id number] with no complication.  ? ?Echo in 12/21 showed EF 20-25%, moderate LVH, normal RV, normal IVC.  ? ?Echo 12/22, EF 20-25%, diffuse hypokinesis, mildly decreased RV systolic function.  ? ?CPX 1/23 showed moderate functional impairment,  primarily limited due to body habitus and deconditioning with additional limitations related to chronotropic incompetence. ? ?Also seen by Dr. Caryl Comes 1/23 for device follow-up. He was noted to have frequent PVCs but Dr. Caryl Comes recommended continued surveillance and noted that either catheter ablation or an adjunctive use of ranolazine could later be considered if burden increased. Pt had also complained of b/l wrist and arm pain c/w carpal tunnel, rasing concern for ? Infiltrative CM such as amyloid.  ? ?Pt returns  to the [facility] today for f/u. Reports felling well. Has lost ~15 lb since last visit through dietary changes. Has reduced consumption of high sugary foods and illuminated soft drinks. Device interrogated w/ help of [employer] device rep (full interrogation). Impedence is good, and fluid index is down. No AT/AF. Activity level ~3hr/day. He had a long run of VT 2/23 but self terminated and did not need therapy. Device rep also confirmed that despite having a BiV pacer, his RV lead has been turned off, only to allow LV pacing. His LV pacing percentage is 93%.  ? ?From symptom standpoint, he denies any significant limitations w/ ALDs, NYHA Class I-II. No dizziness w/ meds. BP today 130/66. Denies CP. Quit smoking. Still drinking beer, moderately. Fully compliant w/ CPAP. No abnormal bleeding w/ Xarelto. Device is followed by [name] [name]. Monthly Optivol monitoring.  ? ? ?He still drinks about 12 beers/week, mainly on weekends.  ?ECG (personally reviewed): Sinus bradycardia, 57 bpm. QTc 488 ms  ? ?Labs (9/18): K 3.8, creatinine 1.02 ?Labs (11/18): K 4.4, creatinine 1.19, TSH normal, LFTs normal ?Labs (11/05/18): K 4 Creatinine 1.05  ?Labs (12/20): K 4, creatinine 1.19 ?Labs (3/21): K 4.1, creatinine 1.05, hgb 10.8, LFTs normal, TSH normal ?Labs (11/21): K 4.7, creatinine 1.24, hgb 11.9 ?Labs (12/21): K 4, creatinine 1.05, TSH and LFTs normal ?Labs (5/22): K 4.8, creatinine 1.67 ?Labs (12/22): 3.9, creatinine 1.8, TSH 1.085, LFTs normal, Hgb 11.8, BNP 81  ? ?PMH: ?1. Chronic systolic CHF: [employer] CRT-D.  Nonischemic cardiomyopathy. Possibly ETOH-related.  ?-  LHC (2016): No CAD.  ?- Echo (2016): EF 20-25%, diffuse hypokinesis.  ?- Echo (4/18): EF 15%, normal RV size and systolic function.  ?- Echo (11/18): EF 20%, mildly dilated LV with mild LVH, normal RV size with mildly decreased RV systolic function.  ?- Echo (12/20): EF 20-25%, severe LV dilation, moderate LVH, normal RV, small IVC.  ?- Echo (12/21): EF 20-25%, moderate LVH,  normal RV, normal IVC. ?- Echo (12/22): EF 20-25%, diffuse hypokinesis, mildly decreased RV systolic function.  ?2. Atrial fibrillation: Paroxysmal.   ?3. OSA: On CPAP.  ?4. ETOH abuse: Prior heavy ETOH.  ?5. H/o VT ?6. COPD: Prior smoker.  ?7. COVID-19 infection 7/22 ? ?ROS: All systems reviewed and negative except as per HPI.  ? ?Current Outpatient Medications  ?Medication Sig Dispense Refill  ? albuterol (VENTOLIN HFA) 108 (90 Base) MCG/ACT inhaler Inhale 1 puff into the lungs every 6 (six) hours as needed for wheezing or shortness of breath. 18 g 3  ? amiodarone (PACERONE) 200 MG tablet TAKE 1 TABLET (200 MG TOTAL) BY MOUTH DAILY. 90 tablet 3  ? Ascorbic Acid (VITAMIN C) 100 MG tablet Take 100 mg by mouth daily.    ? atorvastatin (LIPITOR) 80 MG tablet TAKE 1 TABLET (80 MG TOTAL) BY MOUTH EVERY EVENING. 90 tablet 3  ? sotalol (BETAPACE) 80 MG tablet TAKE 1 TABLET (80 MG TOTAL) BY MOUTH EVERY 12 (TWELVE) HOURS. 180 tablet 3  ? spironolactone (ALDACTONE) 25 MG tablet TAKE 1 TABLET (25 MG TOTAL) BY MOUTH DAILY. 90 tablet 3  ? torsemide (DEMADEX) 20 MG tablet Take 2 tablets (40 mg total) by mouth 2 (two) times daily. 60 tablet 6  ? XARELTO 20 MG TABS tablet TAKE 1 TABLET DAILY WITH SUPPER. 90 tablet 3  ? sacubitril-valsartan (ENTRESTO) 97-103 MG Take 1 tablet by mouth 2 (two) times daily. 90 tablet 3  ? ?No current facility-administered medications for this encounter.  ? ? ?No Known Allergies ? ?  ?Social History  ? ?Socioeconomic History  ? Marital status: Single  ?  Spouse name: Not on file  ? Number of children: 2  ? Years of education: Not on file  ? Highest education level: Not on file  ?Occupational History  ? Occupation: Air traffic controller  ? Occupation: disabled, odd jobs  ?Tobacco Use  ? Smoking status: Former  ?  Packs/day: 0.50  ?  Years: 4.00  ?  Pack years: 2.00  ?  Types: Cigarettes  ?  Quit date: 10/29/2005  ?  Years since quitting: 16.1  ? Smokeless tobacco: Never  ? Tobacco comments:  ?  about a pack a  week. social  ?Vaping Use  ? Vaping Use: Never used  ?Substance and Sexual Activity  ? Alcohol use: Yes  ?  Comment: 12 beers per week   ? Drug use: Not Currently  ?  Types: Marijuana  ? Sexual activity: Yes  ?  Partners: Female  ?Other Topics Concern  ? Not on file  ?Social History Narrative  ? ** Merged History Encounter **  ?    ? ?Social Determinants of Health  ? ?Financial Resource Strain: Low Risk   ? Difficulty of Paying Living Expenses: Not hard at all  ?Food Insecurity: No Food Insecurity  ? Worried About Charity fundraiser in the Last Year: Never true  ? Ran Out of Food in the Last Year: Never true  ?Transportation Needs: No Transportation Needs  ? Lack of Transportation (Medical): No  ?  Lack of Transportation (Non-Medical): No  ?Physical Activity: Insufficiently Active  ? Days of Exercise per Week: 2 days  ? Minutes of Exercise per Session: 20 min  ?Stress: No Stress Concern Present  ? Feeling of Stress : Not at all  ?Social Connections: Moderately Isolated  ? Frequency of Communication with Friends and Family: More than three times a week  ? Frequency of Social Gatherings with Friends and Family: More than three times a week  ? Attends Religious Services: 1 to 4 times per year  ? Active Member of Clubs or Organizations: No  ? Attends Archivist Meetings: Never  ? Marital Status: Divorced  ?Intimate Partner Violence: Not At Risk  ? Fear of Current or Ex-Partner: No  ? Emotionally Abused: No  ? Physically Abused: No  ? Sexually Abused: No  ? ? ?  ?Family History  ?Problem Relation Age of Onset  ? Other Mother   ?     cardiac surgery. late 1990s  ? Heart Problems Mother   ?     CABG AGE 11  ? Congestive Heart Failure Father   ? Healthy Father   ?     AGE 67  ? Coronary artery disease Other   ? Healthy Brother   ?     AGE 26  ? Healthy Sister   ?     AGE 22  ? Healthy Sister   ?     AGE 49  ? Healthy Son   ? Healthy Son   ? Healthy Daughter   ? ? ?Vitals:  ? 12/01/21 1101  ?BP: 130/66  ?Pulse: (!)  54  ?SpO2: 97%  ?Weight: (!) 142.4 kg (314 lb)  ?Height: 6\' 4"  (1.93 m)  ? ? ?Wt Readings from Last 3 Encounters:  ?12/01/21 (!) 142.4 kg (314 lb)  ?09/26/21 (!) 145.6 kg (321 lb)  ?08/19/21 (!) 145.7 kg (3

## 2021-12-01 ENCOUNTER — Ambulatory Visit (HOSPITAL_COMMUNITY)
Admission: RE | Admit: 2021-12-01 | Discharge: 2021-12-01 | Disposition: A | Discharge status: Home / Self Care | Payer: Medicare HMO | Source: Ambulatory Visit | Attending: Cardiology | Admitting: Cardiology

## 2021-12-01 ENCOUNTER — Encounter (HOSPITAL_COMMUNITY): Payer: Self-pay

## 2021-12-01 ENCOUNTER — Other Ambulatory Visit: Payer: Self-pay

## 2021-12-01 VITALS — BP 130/66 | HR 54 | Ht 76.0 in | Wt 314.0 lb

## 2021-12-01 DIAGNOSIS — I5022 Chronic systolic (congestive) heart failure: Secondary | ICD-10-CM

## 2021-12-01 DIAGNOSIS — I472 Ventricular tachycardia, unspecified: Secondary | ICD-10-CM | POA: Diagnosis not present

## 2021-12-01 DIAGNOSIS — Z87891 Personal history of nicotine dependence: Secondary | ICD-10-CM | POA: Insufficient documentation

## 2021-12-01 DIAGNOSIS — Z09 Encounter for follow-up examination after completed treatment for conditions other than malignant neoplasm: Secondary | ICD-10-CM | POA: Diagnosis not present

## 2021-12-01 DIAGNOSIS — F1010 Alcohol abuse, uncomplicated: Secondary | ICD-10-CM | POA: Insufficient documentation

## 2021-12-01 DIAGNOSIS — Z8616 Personal history of COVID-19: Secondary | ICD-10-CM | POA: Diagnosis not present

## 2021-12-01 DIAGNOSIS — I480 Paroxysmal atrial fibrillation: Secondary | ICD-10-CM | POA: Insufficient documentation

## 2021-12-01 DIAGNOSIS — G4733 Obstructive sleep apnea (adult) (pediatric): Secondary | ICD-10-CM | POA: Insufficient documentation

## 2021-12-01 DIAGNOSIS — I428 Other cardiomyopathies: Secondary | ICD-10-CM | POA: Insufficient documentation

## 2021-12-01 DIAGNOSIS — I110 Hypertensive heart disease with heart failure: Secondary | ICD-10-CM | POA: Diagnosis not present

## 2021-12-01 DIAGNOSIS — Z9989 Dependence on other enabling machines and devices: Secondary | ICD-10-CM | POA: Insufficient documentation

## 2021-12-01 DIAGNOSIS — Z7901 Long term (current) use of anticoagulants: Secondary | ICD-10-CM | POA: Insufficient documentation

## 2021-12-01 DIAGNOSIS — Z79899 Other long term (current) drug therapy: Secondary | ICD-10-CM | POA: Diagnosis not present

## 2021-12-01 DIAGNOSIS — I493 Ventricular premature depolarization: Secondary | ICD-10-CM | POA: Diagnosis not present

## 2021-12-01 LAB — CBC
HCT: 31.7 % — ABNORMAL LOW (ref 39.0–52.0)
Hemoglobin: 10.8 g/dL — ABNORMAL LOW (ref 13.0–17.0)
MCH: 32.3 pg (ref 26.0–34.0)
MCHC: 34.1 g/dL (ref 30.0–36.0)
MCV: 94.9 fL (ref 80.0–100.0)
Platelets: 152 10*3/uL (ref 150–400)
RBC: 3.34 MIL/uL — ABNORMAL LOW (ref 4.22–5.81)
RDW: 13 % (ref 11.5–15.5)
WBC: 4.2 10*3/uL (ref 4.0–10.5)
nRBC: 0 % (ref 0.0–0.2)

## 2021-12-01 LAB — BASIC METABOLIC PANEL
Anion gap: 8 (ref 5–15)
BUN: 15 mg/dL (ref 6–20)
CO2: 32 mmol/L (ref 22–32)
Calcium: 9.2 mg/dL (ref 8.9–10.3)
Chloride: 100 mmol/L (ref 98–111)
Creatinine, Ser: 1.28 mg/dL — ABNORMAL HIGH (ref 0.61–1.24)
GFR, Estimated: >60 mL/min (ref >60)
Glucose, Bld: 99 mg/dL (ref 70–99)
Potassium: 4.1 mmol/L (ref 3.5–5.1)
Sodium: 140 mmol/L (ref 135–145)

## 2021-12-01 MED ORDER — TORSEMIDE 20 MG PO TABS: 40.0000 mg | ORAL_TABLET | Freq: Two times a day (BID) | ORAL | 6 refills | Status: DC

## 2021-12-01 MED ORDER — SACUBITRIL-VALSARTAN 97-103 MG PO TABS: 1.0000 | ORAL_TABLET | Freq: Two times a day (BID) | ORAL | 3 refills | Status: DC

## 2021-12-01 NOTE — Patient Instructions (Addendum)
INCREASE Entresto to 97/103mg  (1 tab) twice a day ? ?DECREASE Torsemide to 40mg  (2 tabs) twice a day ? ?Labs today ?We will only contact you if something comes back abnormal or we need to make some changes. ?Otherwise no news is good news! ? ?Please follow up with Dr. office.  I have sent a message to his nurse to contact you.  If you do not receive a call within a week. Please give their office a call.  ? ?Dr Koren Bound office ?[phone] ? ?Your physician recommends that you schedule a follow-up appointment in: 4 months with Dr [phone].  We will call you to schedule this appointment.  ? ?Please call office at [phone] option [phone] if you have any questions or concerns.  ? ?At the [facility] Clinic, you and your health needs are our priority. As part of our continuing mission to provide you with exceptional heart care, we have created designated Provider Care Teams. These Care Teams include your primary Cardiologist (physician) and Advanced Practice Providers (APPs- Physician Assistants and Nurse Practitioners) who all work together to provide you with the care you need, when you need it.  ? ?You may see any of the following providers on your designated Care Team at your next follow up: ?Dr [phone] ?Dr Arvilla Meres ?Marca Ancona, NP ?Tonye Becket, PA ?Jessica Milford,NP ?Robbie Lis, PA ?Anna Genre, PharmD ? ? ?Please be sure to bring in all your medications bottles to every appointment.  ? ? ?

## 2021-12-05 ENCOUNTER — Other Ambulatory Visit (HOSPITAL_COMMUNITY): Payer: Self-pay

## 2021-12-05 MED ORDER — SACUBITRIL-VALSARTAN 97-103 MG PO TABS: 1.0000 | ORAL_TABLET | Freq: Two times a day (BID) | ORAL | 3 refills | Status: DC

## 2021-12-12 ENCOUNTER — Other Ambulatory Visit: Payer: Self-pay | Admitting: Cardiology

## 2021-12-20 ENCOUNTER — Other Ambulatory Visit (HOSPITAL_COMMUNITY): Payer: Self-pay | Admitting: Cardiology

## 2021-12-26 ENCOUNTER — Ambulatory Visit (INDEPENDENT_AMBULATORY_CARE_PROVIDER_SITE_OTHER): Payer: Medicare HMO

## 2021-12-26 DIAGNOSIS — I5022 Chronic systolic (congestive) heart failure: Secondary | ICD-10-CM

## 2021-12-26 DIAGNOSIS — Z95810 Presence of automatic (implantable) cardiac defibrillator: Secondary | ICD-10-CM

## 2021-12-28 NOTE — Progress Notes (Signed)
EPIC Encounter for ICM Monitoring ? ?Patient Name: [name] [name] is a 61 y.o. male ?Date: 12/28/2021 ?Primary Care Physican: [name], NP ?Primary Cardiologist: Shirlee Latch ?Electrophysiologist: Graciela Husbands ?L-V Pacing: 94.5% ?10/19/2021 Weight: 321 lbs ?  ?Battery Longevity: 2 months ?  ?Spoke with patient and heart failure questions reviewed.  Pt asymptomatic for fluid accumulation.  Reports feeling well at this time and voices no complaints.  ?  ?Optivol Thoracic impedance suggesting normal fluid levels. ?   ?Prescribed:  ?Torsemide 20 mg 2 tablets (40 mg total) by mouth twice a day. (Decreased on 3/16) ?Spironolactone 25 mg take 1 tablet daily. ?  ?Labs: ?12/01/2021 Creatinine 1.28, BUN 15, Potassium 4.1, Sodium 140, GFR >60 ?08/19/2021 Creatinine 1.18, BUN 15, Potassium 3.9, Sodium 138, GFR >60 ?05/30/2021 Creatinine 1.37, BUN 21, Potassium 3.8, Sodium 139, GFR 60  ?05/18/2021 Creatinine 1.31, BUN 18, Potassium 4.4, Sodium 138, GFR >60  ?A complete set of results can be found in Results Review. ?  ?Recommendations:  No changes and encouraged to call if experiencing any fluid symptoms. ?  ?Follow-up plan: ICM clinic phone appointment on 01/30/2022.   91 day device clinic remote transmission 02/20/2022.   ?  ?EP/Cardiology Office Visits:  03/31/2022 with Dr Shirlee Latch.   12/30/2021 with Dr Graciela Husbands.   ?  ?Copy of ICM check sent to Dr. Graciela Husbands and Robbie Lis, PA at Advanced Heart Failure clinic as FYI that Optivol remains normal after decreasing Torsemide dosage 3/16.  ? ?3 month ICM trend: 12/28/2021. ? ? ? ?12-14 Month ICM trend:  ? ? ? ?[name], RN ?12/28/2021 ?11:11 AM ? ?

## 2021-12-30 ENCOUNTER — Ambulatory Visit: Payer: Medicare HMO | Admitting: Internal Medicine

## 2021-12-30 ENCOUNTER — Encounter: Payer: Self-pay | Admitting: Internal Medicine

## 2021-12-30 VITALS — BP 96/62 | HR 51 | Ht 76.0 in | Wt 315.0 lb

## 2021-12-30 DIAGNOSIS — Z79899 Other long term (current) drug therapy: Secondary | ICD-10-CM

## 2021-12-30 DIAGNOSIS — I428 Other cardiomyopathies: Secondary | ICD-10-CM

## 2021-12-30 DIAGNOSIS — I495 Sick sinus syndrome: Secondary | ICD-10-CM

## 2021-12-30 DIAGNOSIS — I5022 Chronic systolic (congestive) heart failure: Secondary | ICD-10-CM

## 2021-12-30 DIAGNOSIS — Z95810 Presence of automatic (implantable) cardiac defibrillator: Secondary | ICD-10-CM

## 2021-12-30 DIAGNOSIS — I493 Ventricular premature depolarization: Secondary | ICD-10-CM

## 2021-12-30 DIAGNOSIS — I4891 Unspecified atrial fibrillation: Secondary | ICD-10-CM | POA: Diagnosis not present

## 2021-12-30 DIAGNOSIS — I472 Ventricular tachycardia, unspecified: Secondary | ICD-10-CM | POA: Diagnosis not present

## 2021-12-30 NOTE — Progress Notes (Signed)
10 months 3 CRT-D  ? ? ? ? ?Patient Care Team: ?Camillia Herter, NP as PCP - General (Nurse Practitioner) ?[name], Shaune Pascal, MD as PCP - Advanced Heart Failure (Cardiology) ?Deboraha Sprang, MD as PCP - Electrophysiology (Cardiology) ?Larey Dresser, MD as PCP - Cardiology (Cardiology) ?[name], MD (Family Medicine) ?Jorge Ny, LCSW as Education officer, museum (Licensed Holiday representative) ? ? ?HPI ? ?[name] [name] is a 61 y.o. 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  When I last saw him 3/21 in hospital reprogrammed LV subtherapeutic with a QRS duration of 140 ms ? ?He also has a history of atrial fibrillation and polymorphic ventricular tachycardia for which amiodarone therapy was initiated. Potassium levels were low at that time  ? ?Recurrent ventricular tachycardia 9/18 potassium levels were normal at that time.   With failed shocks and amiodarone was continued and sotalol was initiated. ? ?Hospitalized 3/21 his device was reprogrammed.  Unfortunately there is a discordance between my notes and the finding, my notes reporting that LV lead was turned off and the device programming is that RV lead is subthreshold. ? ? The patient denies chest pain, nocturnal dyspnea, orthopnea or peripheral edema.  There have been no palpitations, lightheadedness or syncope.  Complains of some shortness of breath but much improved. ? ?Carpal tunnel pain is much improved with night braces..  ? ? ? ?Date Cr K Mg TSH LFTs PFTs  ?9/18  1.02 3.8 2.3        ?11/18     0.860 36    ?12/22 1.18 3.9 2.3 (5/22) 1.085 16   ?  ?DATE TEST EF   ?12/21 Echo 20-25 % LV global hypokinesis and hypertrophy, G1 diastolic dysfunction  ?12/22 Echo 20-25%   ?     ?  ? ? ?Past Medical History:  ?Diagnosis Date  ? AICD (automatic cardioverter/defibrillator) present   ? Arthritis   ?  Atrial fibrillation ([place])   ? CAD (coronary artery disease)   ? Mild nonobstructive (Cath 09)  ? Chronic systolic heart failure ([place])   ? Nonischemic CM: echo 4/12 with EF 25-30%, grade 2 diast dysfxn, mild dilated aortic root 43 mm, trivial MR, mod LAE  ? COPD (chronic obstructive pulmonary disease) ([place])   ? Dyspnea   ? with exertion   ? Ejection fraction < 50% 08/2019  ? 20-25% noted on ECHO  ? H/O ETOH abuse   ? HTN (hypertension)   ? Hypersomnia   ? Nonischemic cardiomyopathy ([place])   ? Obesity, morbid ([place])   ? OSA on CPAP   ? Systolic and diastolic CHF, acute on chronic ([place]) 11/2016  ? Tobacco use disorder   ? Ventricular tachycardia ([place])   ? ? ?Past Surgical History:  ?Procedure Laterality Date  ? CARDIAC CATHETERIZATION N/A 04/08/2015  ? Procedure: Right/Left Heart Cath and Coronary Angiography;  Surgeon: Jettie Booze, MD;  Location: [facility];  Service: Cardiovascular;  Laterality: N/A;  ? EP IMPLANTABLE DEVICE N/A 04/14/2015  ? Procedure: BiV ICD Insertion CRT-D;  Surgeon: Deboraha Sprang, MD;  Location: [facility];  Service: Cardiovascular;  Laterality: N/A;  ? HYDROCELE EXCISION / REPAIR    ? ICD IMPLANT    ? TOTAL HIP ARTHROPLASTY Right 11/04/2018  ? Procedure: TOTAL HIP ARTHROPLASTY ANTERIOR APPROACH;  Surgeon: Dorna Leitz, MD;  Location:  [facility];  Service: Orthopedics;  Laterality: Right;  ? TOTAL KNEE ARTHROPLASTY Right 01/30/2020  ? Procedure: TOTAL KNEE ARTHROPLASTY;  Surgeon: Dorna Leitz, MD;  Location: [facility];  Service: Orthopedics;  Laterality: Right;  ? TOTAL KNEE ARTHROPLASTY Left 07/30/2020  ? Procedure: TOTAL KNEE ARTHROPLASTY;  Surgeon: Dorna Leitz, MD;  Location: [facility];  Service: Orthopedics;  Laterality: Left;  ? TUMOR EXCISION Left   ? TUMOR EXCISION Left 1982  ? Benign tumor removed from L leg  ? VASECTOMY    ? ? ?Current Outpatient Medications  ?Medication Sig Dispense Refill  ? albuterol (VENTOLIN HFA) 108 (90 Base) MCG/ACT inhaler Inhale 1 puff into the lungs  every 6 (six) hours as needed for wheezing or shortness of breath. 18 g 3  ? amiodarone (PACERONE) 200 MG tablet TAKE 1 TABLET (200 MG TOTAL) BY MOUTH DAILY. 90 tablet 3  ? Ascorbic Acid (VITAMIN C) 100 MG tablet Take 100 mg by mouth daily.    ? atorvastatin (LIPITOR) 80 MG tablet TAKE 1 TABLET (80 MG TOTAL) BY MOUTH EVERY EVENING. 90 tablet 3  ? sacubitril-valsartan (ENTRESTO) 97-103 MG Take 1 tablet by mouth 2 (two) times daily. 180 tablet 3  ? sotalol (BETAPACE) 80 MG tablet TAKE 1 TABLET (80 MG TOTAL) BY MOUTH EVERY 12 (TWELVE) HOURS. 180 tablet 3  ? spironolactone (ALDACTONE) 25 MG tablet TAKE 1 TABLET (25 MG TOTAL) BY MOUTH DAILY. 90 tablet 3  ? torsemide (DEMADEX) 20 MG tablet Take 2 tablets (40 mg total) by mouth 2 (two) times daily. 60 tablet 6  ? XARELTO 20 MG TABS tablet TAKE 1 TABLET DAILY WITH SUPPER. 90 tablet 3  ? ?No current facility-administered medications for this visit.  ? ? ?No Known Allergies ? ? ? ?Review of Systems negative except from HPI and PMH ? ?Physical Exam ?BP 96/62   Pulse (!) 51   Ht 6\' 4"  (1.93 m)   Wt (!) 315 lb (142.9 kg)   SpO2 93%   BMI 38.34 kg/m?  ?Well developed and well nourished in no acute distress ?HENT normal ?Neck supple with JVP-flat ?Clear ?Device pocket well healed; without hematoma or erythema.  There is no tethering  ?Regular rate and rhythm, no murmur ?Abd-soft with active BS ?No Clubbing cyanosis  edema ?Skin-warm and dry ?A & Oriented  Grossly normal sensory and motor function ? ?ECG sinus with P synchronous pacing with a ?Intervals 20/15/49 ? ?12/18 QRS Rs V1 and negative lead I QRSd approximately 200 ms ? ?1/20 QRS upright lead I and negative lead V1 QRSd 158 ms ?12/20 QRS negative lead I and upright V1 QRSd 218 ms ?12/21 QRS negative lead V1 upright lead I QRSd 142 ms ?8/22 QRS negative lead V1 upright lead I QRSd 140 ms ? ?Assessment and  Plan ? ?Nonischemic cardiomyopathy ? ?Congestive heart failure-chronic-systolic ? ?Ventricular  tachycardia-polymorphic ? ?Implantable defibrillator-CRT-Medtronic ? ?PVCs ? ?Atrial fibrillation-paroxysmal ? ?Amiodarone therapy for the above ? ?Sinus node dysfunction with chronotropic incompetence ? ?Carpal tunnel ? ?Well compensated despite significant left ventricular dysfunction; continue entresto and aldactone-- ? ?No intercurrent ventricular tachycardia.  We will continue the amiodarone 200 mg a day and sotalol at 80 mg twice daily.  Surveillance laboratories were ordered 12/22.  Tolerating clinically. ? ?With his likely carpal tunnel and his cardiomyopathy, we will undertake a pyrophosphate scan as well as a SPEP. ? ?No interval atrial fibrillation of note.  We will continue him on his anticoagulation with Xarelto.  No clinical bleeding. ? ?  Device is approaching ERI.  Discussed potential risks including infection and lead fracture.  He understands these risks and is ready to proceed at that time. ? ?  ? .  ? ?

## 2021-12-30 NOTE — Patient Instructions (Signed)
Medication Instructions:  ?Your physician recommends that you continue on your current medications as directed. Please refer to the Current Medication list given to you today. ? ?*If you need a refill on your cardiac medications before your next appointment, please call your pharmacy* ? ? ?Lab Work: ?TSH, Liver panel and Serum Protein Electrophoresis today ?   ?If you have labs (blood work) drawn today and your tests are completely normal, you will receive your results only by: ?MyChart Message (if you have MyChart) OR ?A paper copy in the mail ?If you have any lab test that is abnormal or we need to change your treatment, we will call you to review the results. ? ? ?Testing/Procedures: ?Dr Graciela Husbands would like for you to have an Amyloid Scan  ? ? ?Follow-Up: ?At [facility], you and your health needs are our priority.  As part of our continuing mission to provide you with exceptional heart care, we have created designated Provider Care Teams.  These Care Teams include your primary Cardiologist (physician) and Advanced Practice Providers (APPs -  Physician Assistants and Nurse Practitioners) who all work together to provide you with the care you need, when you need it. ? ?We recommend signing up for the patient portal called "MyChart".  Sign up information is provided on this After Visit Summary.  MyChart is used to connect with patients for Virtual Visits (Telemedicine).  Patients are able to view lab/test results, encounter notes, upcoming appointments, etc.  Non-urgent messages can be sent to your provider as well.   ?To learn more about what you can do with MyChart, go to [email or web address].   ? ?Your next appointment:   ?6 months with Dr Graciela Husbands ? ?Important Information About Sugar ? ? ? ? ?  ?

## 2022-01-03 ENCOUNTER — Ambulatory Visit (INDEPENDENT_AMBULATORY_CARE_PROVIDER_SITE_OTHER): Payer: Self-pay

## 2022-01-03 DIAGNOSIS — I428 Other cardiomyopathies: Secondary | ICD-10-CM

## 2022-01-03 LAB — CUP PACEART REMOTE DEVICE CHECK
Battery Remaining Longevity: 2 mo
Battery Voltage: 2.73 V
Brady Statistic AP VP Percent: 10.45 %
Brady Statistic AP VS Percent: 2.25 %
Brady Statistic AS VP Percent: 85.81 %
Brady Statistic AS VS Percent: 1.49 %
Brady Statistic RA Percent Paced: 12.31 %
Brady Statistic RV Percent Paced: 93.98 %
Date Time Interrogation Session: 20230418133724
HighPow Impedance: 63 Ohm
Implantable Lead Implant Date: 20160727
Implantable Lead Implant Date: 20160727
Implantable Lead Implant Date: 20160727
Implantable Lead Location: 753858
Implantable Lead Location: 753859
Implantable Lead Location: 753860
Implantable Lead Model: 4598
Implantable Lead Model: 5076
Implantable Lead Model: 6935
Implantable Pulse Generator Implant Date: 20160727
Lead Channel Impedance Value: 361 Ohm
Lead Channel Impedance Value: 361 Ohm
Lead Channel Impedance Value: 361 Ohm
Lead Channel Impedance Value: 399 Ohm
Lead Channel Impedance Value: 418 Ohm
Lead Channel Impedance Value: 456 Ohm
Lead Channel Impedance Value: 475 Ohm
Lead Channel Impedance Value: 475 Ohm
Lead Channel Impedance Value: 608 Ohm
Lead Channel Impedance Value: 646 Ohm
Lead Channel Impedance Value: 665 Ohm
Lead Channel Impedance Value: 703 Ohm
Lead Channel Impedance Value: 703 Ohm
Lead Channel Pacing Threshold Amplitude: 0.5 V
Lead Channel Pacing Threshold Amplitude: 0.625 V
Lead Channel Pacing Threshold Amplitude: 1.625 V
Lead Channel Pacing Threshold Pulse Width: 0.4 ms
Lead Channel Pacing Threshold Pulse Width: 0.4 ms
Lead Channel Pacing Threshold Pulse Width: 0.6 ms
Lead Channel Sensing Intrinsic Amplitude: 3.25 mV
Lead Channel Sensing Intrinsic Amplitude: 3.25 mV
Lead Channel Sensing Intrinsic Amplitude: 3.75 mV
Lead Channel Sensing Intrinsic Amplitude: 3.75 mV
Lead Channel Setting Pacing Amplitude: 0.5 V
Lead Channel Setting Pacing Amplitude: 1.5 V
Lead Channel Setting Pacing Amplitude: 3 V
Lead Channel Setting Pacing Pulse Width: 0.03 ms
Lead Channel Setting Pacing Pulse Width: 0.6 ms
Lead Channel Setting Sensing Sensitivity: 0.3 mV

## 2022-01-03 LAB — HEPATIC FUNCTION PANEL
ALT: 29 IU/L (ref 0–44)
AST: 23 IU/L (ref 0–40)
Albumin: 4.6 g/dL (ref 3.8–4.9)
Alkaline Phosphatase: 92 IU/L (ref 44–121)
Bilirubin Total: 1 mg/dL (ref 0.0–1.2)
Bilirubin, Direct: 0.28 mg/dL (ref 0.00–0.40)

## 2022-01-03 LAB — PROTEIN ELECTROPHORESIS, SERUM
A/G Ratio: 1.6 (ref 0.7–1.7)
Albumin ELP: 4.2 g/dL (ref 2.9–4.4)
Alpha 1: 0.2 g/dL (ref 0.0–0.4)
Alpha 2: 0.5 g/dL (ref 0.4–1.0)
Beta: 0.9 g/dL (ref 0.7–1.3)
Gamma Globulin: 1.1 g/dL (ref 0.4–1.8)
Globulin, Total: 2.7 g/dL (ref 2.2–3.9)
Total Protein: 6.9 g/dL (ref 6.0–8.5)

## 2022-01-03 LAB — TSH: TSH: 0.96 u[IU]/mL (ref 0.450–4.500)

## 2022-01-05 DIAGNOSIS — H524 Presbyopia: Secondary | ICD-10-CM | POA: Diagnosis not present

## 2022-01-06 ENCOUNTER — Telehealth (HOSPITAL_COMMUNITY): Payer: Self-pay | Admitting: *Deleted

## 2022-01-06 NOTE — Telephone Encounter (Signed)
Close encounter 

## 2022-01-11 ENCOUNTER — Ambulatory Visit (HOSPITAL_COMMUNITY)
Admission: RE | Admit: 2022-01-11 | Discharge: 2022-01-11 | Disposition: A | Discharge status: Home / Self Care | Payer: Medicare HMO | Source: Ambulatory Visit | Attending: Cardiology | Admitting: Cardiology

## 2022-01-11 DIAGNOSIS — I428 Other cardiomyopathies: Secondary | ICD-10-CM | POA: Diagnosis not present

## 2022-01-11 MED ORDER — TECHNETIUM TC 99M PYROPHOSPHATE
21.6000 | Freq: Once | INTRAVENOUS | Status: AC
  Administered 2022-01-11: 21.6 via INTRAVENOUS

## 2022-01-16 ENCOUNTER — Encounter (HOSPITAL_COMMUNITY): Payer: Medicare HMO

## 2022-01-17 ENCOUNTER — Inpatient Hospital Stay (HOSPITAL_COMMUNITY): Admission: RE | Admit: 2022-01-17 | Payer: Medicare HMO | Source: Ambulatory Visit

## 2022-01-20 NOTE — Progress Notes (Signed)
Remote ICD transmission.   

## 2022-01-23 ENCOUNTER — Telehealth: Payer: Self-pay

## 2022-01-23 NOTE — Telephone Encounter (Signed)
Spoke with patient informed him that his ICD had reached ERI informed him that I would send a message to scheduler to get him in to see SK or EP APP, patient voiced understanding patient stated that the alarm did not bother him  ?

## 2022-01-30 ENCOUNTER — Ambulatory Visit (INDEPENDENT_AMBULATORY_CARE_PROVIDER_SITE_OTHER): Payer: Medicare HMO

## 2022-01-30 DIAGNOSIS — Z95810 Presence of automatic (implantable) cardiac defibrillator: Secondary | ICD-10-CM

## 2022-01-30 DIAGNOSIS — I5022 Chronic systolic (congestive) heart failure: Secondary | ICD-10-CM

## 2022-01-31 NOTE — Progress Notes (Signed)
EPIC Encounter for ICM Monitoring ? ?Patient Name: [name] [name] is a 61 y.o. male ?Date: 01/31/2022 ?Primary Care Physican: Camillia Herter, NP ?Primary Cardiologist: Aundra Dubin ?Electrophysiologist: Caryl Comes ?L-V Pacing: 90.3% ?10/19/2021 Weight: 321 lbs ?01/31/2022 Weight: 312 lbs ?  ?Battery reached ERI and will be scheduled for surgery.   ?  ?Spoke with patient and heart failure questions reviewed.  Pt reports one day of ankle swelling but has resolved.   ?  ?Optivol Thoracic impedance suggesting normal fluid levels. ?   ?Prescribed:  ?Torsemide 20 mg 2 tablets (40 mg total) by mouth twice a day. (Decreased on 3/16) ?Spironolactone 25 mg take 1 tablet daily. ?  ?Labs: ?12/01/2021 Creatinine 1.28, BUN 15, Potassium 4.1, Sodium 140, GFR >60 ?08/19/2021 Creatinine 1.18, BUN 15, Potassium 3.9, Sodium 138, GFR >60 ?05/30/2021 Creatinine 1.37, BUN 21, Potassium 3.8, Sodium 139, GFR 60  ?05/18/2021 Creatinine 1.31, BUN 18, Potassium 4.4, Sodium 138, GFR >60  ?A complete set of results can be found in Results Review. ?  ?Recommendations:  No changes and encouraged to call if experiencing any fluid symptoms. ?  ?Follow-up plan: ICM clinic phone appointment on 03/13/2022.   91 day device clinic remote transmission 02/20/2022.   ?  ?EP/Cardiology Office Visits:  03/31/2022 with Dr Aundra Dubin.   02/07/2022 with Oda Kilts, PA.   ?  ?Copy of ICM check sent to Dr. Caryl Comes ? ?3 month ICM trend: 01/31/2022. ? ? ? ?12-14 Month ICM trend:  ? ? ? ?Rosalene Billings, RN ?01/31/2022 ?4:34 PM ? ?

## 2022-02-07 ENCOUNTER — Encounter: Payer: Self-pay | Admitting: Student

## 2022-02-07 ENCOUNTER — Ambulatory Visit: Payer: Medicare HMO | Admitting: Student

## 2022-02-07 VITALS — BP 122/64 | HR 60 | Ht 76.0 in | Wt 316.0 lb

## 2022-02-07 DIAGNOSIS — I480 Paroxysmal atrial fibrillation: Secondary | ICD-10-CM

## 2022-02-07 DIAGNOSIS — I5022 Chronic systolic (congestive) heart failure: Secondary | ICD-10-CM | POA: Diagnosis not present

## 2022-02-07 LAB — CBC WITH DIFFERENTIAL/PLATELET
Basophils Absolute: 0 10*3/uL (ref 0.0–0.2)
Basos: 0 %
EOS (ABSOLUTE): 0.2 10*3/uL (ref 0.0–0.4)
Eos: 4 %
Hematocrit: 33 % — ABNORMAL LOW (ref 37.5–51.0)
Hemoglobin: 11.8 g/dL — ABNORMAL LOW (ref 13.0–17.7)
Immature Grans (Abs): 0 10*3/uL (ref 0.0–0.1)
Immature Granulocytes: 0 %
Lymphocytes Absolute: 1.2 10*3/uL (ref 0.7–3.1)
Lymphs: 24 %
MCH: 33.7 pg — ABNORMAL HIGH (ref 26.6–33.0)
MCHC: 35.8 g/dL — ABNORMAL HIGH (ref 31.5–35.7)
MCV: 94 fL (ref 79–97)
Monocytes Absolute: 0.3 10*3/uL (ref 0.1–0.9)
Monocytes: 7 %
Neutrophils Absolute: 3.2 10*3/uL (ref 1.4–7.0)
Neutrophils: 65 %
Platelets: 177 10*3/uL (ref 150–450)
RBC: 3.5 x10E6/uL — ABNORMAL LOW (ref 4.14–5.80)
RDW: 12.4 % (ref 11.6–15.4)
WBC: 4.9 10*3/uL (ref 3.4–10.8)

## 2022-02-07 LAB — BASIC METABOLIC PANEL
BUN/Creatinine Ratio: 17 (ref 9–20)
BUN: 22 mg/dL (ref 6–24)
CO2: 25 mmol/L (ref 20–29)
Calcium: 9.3 mg/dL (ref 8.7–10.2)
Chloride: 98 mmol/L (ref 96–106)
Creatinine, Ser: 1.32 mg/dL — ABNORMAL HIGH (ref 0.76–1.27)
Glucose: 90 mg/dL (ref 70–99)
Potassium: 4.1 mmol/L (ref 3.5–5.2)
Sodium: 139 mmol/L (ref 134–144)
eGFR: 62 mL/min/{1.73_m2} (ref >59)

## 2022-02-07 NOTE — H&P (View-Only) (Signed)
Patient Care Team: Camillia Herter, NP as PCP - General (Nurse Practitioner) Jolaine Artist, MD as PCP - Advanced Heart Failure (Cardiology) Deboraha Sprang, MD as PCP - Electrophysiology (Cardiology) Larey Dresser, MD as PCP - Cardiology (Cardiology) [name], MD (Family Medicine) Jorge Ny, LCSW as Social Worker (Licensed Clinical Social Worker)   HPI  [name] [name] is a 61 y.o. 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  When I last saw him 3/21 in hospital reprogrammed LV subtherapeutic with a QRS duration of 140 ms  He also has a history of atrial fibrillation and polymorphic ventricular tachycardia for which amiodarone therapy was initiated. Potassium levels were low at that time   Recurrent ventricular tachycardia 9/18 potassium levels were normal at that time.   With failed shocks and amiodarone was continued and sotalol was initiated.  Hospitalized 3/21 his device was reprogrammed.  Unfortunately there is a discordance between my notes and the finding, my notes reporting that LV lead was turned off and the device programming is that RV lead is subthreshold.  Seen today to review gen change. Recently saw Dr. Caryl Comes for same 12/30/2021. Pt denies any new symptoms. Some DOE at baseline, but overall breathing has improved and he remains active and trying to lose weight. Denies chest pain.    Date Cr K Mg TSH LFTs PFTs  9/18  1.02 3.8 2.3        11/18     0.860 36    12/22 1.18 3.9 2.3 (5/22) 1.085 16     DATE TEST EF   12/21 Echo 20-25 % LV global hypokinesis and hypertrophy, G1 diastolic dysfunction  [id number] Echo 20-25%            Past Medical History:  Diagnosis Date   AICD (automatic cardioverter/defibrillator) present    Arthritis    Atrial fibrillation ([facility])    CAD (coronary artery  disease)    Mild nonobstructive (Cath 09)   Chronic systolic heart failure ([facility])    Nonischemic CM: echo 4/12 with EF 25-30%, grade 2 diast dysfxn, mild dilated aortic root 43 mm, trivial MR, mod LAE   COPD (chronic obstructive pulmonary disease) ([facility])    Dyspnea    with exertion    Ejection fraction < 50% 08/2019   20-25% noted on ECHO   H/O ETOH abuse    HTN (hypertension)    Hypersomnia    Nonischemic cardiomyopathy ([facility])    Obesity, morbid ([facility])    OSA on CPAP    Systolic and diastolic CHF, acute on chronic ([place]) 11/2016   Tobacco use disorder    Ventricular tachycardia ([place])     Past Surgical History:  Procedure Laterality Date   CARDIAC CATHETERIZATION N/A 04/08/2015   Procedure: Right/Left Heart Cath and Coronary Angiography;  Surgeon: Jettie Booze, MD;  Location: [facility];  Service: Cardiovascular;  Laterality: N/A;   EP IMPLANTABLE DEVICE N/A 04/14/2015   Procedure: BiV ICD Insertion CRT-D;  Surgeon: Deboraha Sprang, MD;  Location: [place] [facility];  Service: Cardiovascular;  Laterality: N/A;   HYDROCELE EXCISION / REPAIR     ICD IMPLANT     TOTAL HIP ARTHROPLASTY Right 11/04/2018   Procedure: TOTAL HIP ARTHROPLASTY ANTERIOR APPROACH;  Surgeon: Dorna Leitz, MD;  Location: [facility];  Service: Orthopedics;  Laterality: Right;  TOTAL KNEE ARTHROPLASTY Right 01/30/2020   Procedure: TOTAL KNEE ARTHROPLASTY;  Surgeon: Dorna Leitz, MD;  Location: [facility];  Service: Orthopedics;  Laterality: Right;   TOTAL KNEE ARTHROPLASTY Left 07/30/2020   Procedure: TOTAL KNEE ARTHROPLASTY;  Surgeon: Dorna Leitz, MD;  Location: [facility];  Service: Orthopedics;  Laterality: Left;   TUMOR EXCISION Left    TUMOR EXCISION Left 1982   Benign tumor removed from L leg   VASECTOMY      Current Outpatient Medications  Medication Sig Dispense Refill   albuterol (VENTOLIN HFA) 108 (90 Base) MCG/ACT inhaler Inhale 1 puff into the lungs every 6 (six) hours as needed for wheezing or  shortness of breath. 18 g 3   amiodarone (PACERONE) 200 MG tablet TAKE 1 TABLET (200 MG TOTAL) BY MOUTH DAILY. 90 tablet 3   Ascorbic Acid (VITAMIN C) 100 MG tablet Take 100 mg by mouth daily.     atorvastatin (LIPITOR) 80 MG tablet TAKE 1 TABLET (80 MG TOTAL) BY MOUTH EVERY EVENING. 90 tablet 3   sacubitril-valsartan (ENTRESTO) 97-103 MG Take 1 tablet by mouth 2 (two) times daily. 180 tablet 3   sotalol (BETAPACE) 80 MG tablet TAKE 1 TABLET (80 MG TOTAL) BY MOUTH EVERY 12 (TWELVE) HOURS. 180 tablet 3   spironolactone (ALDACTONE) 25 MG tablet TAKE 1 TABLET (25 MG TOTAL) BY MOUTH DAILY. 90 tablet 3   torsemide (DEMADEX) 20 MG tablet Take 2 tablets (40 mg total) by mouth 2 (two) times daily. 60 tablet 6   XARELTO 20 MG TABS tablet TAKE 1 TABLET DAILY WITH SUPPER. 90 tablet 3   No current facility-administered medications for this visit.    No Known Allergies  Review of systems complete and found to be negative unless listed in HPI.    Physical Exam BP 122/64   Pulse 60   Ht 6\' 4"  (1.93 m)   Wt (!) 316 lb (143.3 kg)   SpO2 96%   BMI 38.46 kg/m  General: Pleasant, NAD. No resp difficulty Psych: Normal affect. HEENT:  Normal, without mass or lesion.         Neck: Supple, no bruits or JVD. Carotids 2+. No lymphadenopathy/thyromegaly appreciated. Heart: PMI nondisplaced. RRR no s3, s4, or murmurs. Lungs:  Resp regular and unlabored, CTA. Abdomen: Soft, non-tender, non-distended, No HSM, BS + x 4.   Extremities: No clubbing, cyanosis or edema. DP/PT/Radials 2+ and equal bilaterally. Neuro: Alert and oriented X 3. Moves all extremities spontaneously.   ECG NSR with p P synchronous pacing with a Intervals 20/15/49  12/18 QRS Rs V1 and negative lead I QRSd approximately 200 ms  1/20 QRS upright lead I and negative lead V1 QRSd 158 ms 12/20 QRS negative lead I and upright V1 QRSd 218 ms 12/21 QRS negative lead V1 upright lead I QRSd 142 ms 8/22 QRS negative lead V1 upright lead I QRSd  140 ms  Assessment and  Plan  Nonischemic cardiomyopathy  Congestive heart failure-chronic-systolic  Ventricular tachycardia-polymorphic  Implantable defibrillator-CRT-Medtronic  PVCs  Atrial fibrillation-paroxysmal  Amiodarone therapy for the above  Sinus node dysfunction with chronotropic incompetence  Carpal tunnel  Well compensated despite significant left ventricular dysfunction; continue entresto and aldactone. Follows with CHF team.   Device at [facility] as of 5/8. Recent stable leads with testing. Discussed potential risks including infection and lead fracture.  He understands these risks and is ready to proceed at that time.  With his likely carpal tunnel and his cardiomyopathy, PYP scan performed which was  equivacol for TTR amyloid. Will make sure HF team aware.   No interval atrial fibrillation of note.  We will continue him on his anticoagulation with Xarelto.  No clinical bleeding. No h/o stroke.    Follow up as usual post gen change.    Legrand Como [address]" [place], PA-C  02/07/2022 9:55 AM

## 2022-02-07 NOTE — Progress Notes (Signed)
Patient Care Team: Camillia Herter, NP as PCP - General (Nurse Practitioner) Jolaine Artist, MD as PCP - Advanced Heart Failure (Cardiology) Deboraha Sprang, MD as PCP - Electrophysiology (Cardiology) Larey Dresser, MD as PCP - Cardiology (Cardiology) [name], MD (Family Medicine) Jorge Ny, LCSW as Social Worker (Licensed Clinical Social Worker)   HPI  [name] [name] is a 61 y.o. 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  When I last saw him 3/21 in hospital reprogrammed LV subtherapeutic with a QRS duration of 140 ms  He also has a history of atrial fibrillation and polymorphic ventricular tachycardia for which amiodarone therapy was initiated. Potassium levels were low at that time   Recurrent ventricular tachycardia 9/18 potassium levels were normal at that time.   With failed shocks and amiodarone was continued and sotalol was initiated.  Hospitalized 3/21 his device was reprogrammed.  Unfortunately there is a discordance between my notes and the finding, my notes reporting that LV lead was turned off and the device programming is that RV lead is subthreshold.  Seen today to review gen change. Recently saw Dr. Caryl Comes for same 12/30/2021. Pt denies any new symptoms. Some DOE at baseline, but overall breathing has improved and he remains active and trying to lose weight. Denies chest pain.    Date Cr K Mg TSH LFTs PFTs  9/18  1.02 3.8 2.3        11/18     0.860 36    12/22 1.18 3.9 2.3 (5/22) 1.085 16     DATE TEST EF   12/21 Echo 20-25 % LV global hypokinesis and hypertrophy, G1 diastolic dysfunction  [id number] Echo 20-25%            Past Medical History:  Diagnosis Date   AICD (automatic cardioverter/defibrillator) present    Arthritis    Atrial fibrillation (HCC)    CAD (coronary artery  disease)    Mild nonobstructive (Cath 09)   Chronic systolic heart failure (HCC)    Nonischemic CM: echo 4/12 with EF 25-30%, grade 2 diast dysfxn, mild dilated aortic root 43 mm, trivial MR, mod LAE   COPD (chronic obstructive pulmonary disease) (HCC)    Dyspnea    with exertion    Ejection fraction < 50% 08/2019   20-25% noted on ECHO   H/O ETOH abuse    HTN (hypertension)    Hypersomnia    Nonischemic cardiomyopathy (HCC)    Obesity, morbid (HCC)    OSA on CPAP    Systolic and diastolic CHF, acute on chronic ([place]) 11/2016   Tobacco use disorder    Ventricular tachycardia ([place])     Past Surgical History:  Procedure Laterality Date   CARDIAC CATHETERIZATION N/A 04/08/2015   Procedure: Right/Left Heart Cath and Coronary Angiography;  Surgeon: Jettie Booze, MD;  Location: [facility] [facility];  Service: Cardiovascular;  Laterality: N/A;   EP IMPLANTABLE DEVICE N/A 04/14/2015   Procedure: BiV ICD Insertion CRT-D;  Surgeon: Deboraha Sprang, MD;  Location: [facility];  Service: Cardiovascular;  Laterality: N/A;   HYDROCELE EXCISION / REPAIR     ICD IMPLANT     TOTAL HIP ARTHROPLASTY Right 11/04/2018   Procedure: TOTAL HIP ARTHROPLASTY ANTERIOR APPROACH;  Surgeon: Dorna Leitz, MD;  Location: [facility];  Service: Orthopedics;  Laterality: Right;  TOTAL KNEE ARTHROPLASTY Right 01/30/2020   Procedure: TOTAL KNEE ARTHROPLASTY;  Surgeon: Dorna Leitz, MD;  Location: [facility];  Service: Orthopedics;  Laterality: Right;   TOTAL KNEE ARTHROPLASTY Left 07/30/2020   Procedure: TOTAL KNEE ARTHROPLASTY;  Surgeon: Dorna Leitz, MD;  Location: [facility];  Service: Orthopedics;  Laterality: Left;   TUMOR EXCISION Left    TUMOR EXCISION Left 1982   Benign tumor removed from L leg   VASECTOMY      Current Outpatient Medications  Medication Sig Dispense Refill   albuterol (VENTOLIN HFA) 108 (90 Base) MCG/ACT inhaler Inhale 1 puff into the lungs every 6 (six) hours as needed for wheezing or  shortness of breath. 18 g 3   amiodarone (PACERONE) 200 MG tablet TAKE 1 TABLET (200 MG TOTAL) BY MOUTH DAILY. 90 tablet 3   Ascorbic Acid (VITAMIN C) 100 MG tablet Take 100 mg by mouth daily.     atorvastatin (LIPITOR) 80 MG tablet TAKE 1 TABLET (80 MG TOTAL) BY MOUTH EVERY EVENING. 90 tablet 3   sacubitril-valsartan (ENTRESTO) 97-103 MG Take 1 tablet by mouth 2 (two) times daily. 180 tablet 3   sotalol (BETAPACE) 80 MG tablet TAKE 1 TABLET (80 MG TOTAL) BY MOUTH EVERY 12 (TWELVE) HOURS. 180 tablet 3   spironolactone (ALDACTONE) 25 MG tablet TAKE 1 TABLET (25 MG TOTAL) BY MOUTH DAILY. 90 tablet 3   torsemide (DEMADEX) 20 MG tablet Take 2 tablets (40 mg total) by mouth 2 (two) times daily. 60 tablet 6   XARELTO 20 MG TABS tablet TAKE 1 TABLET DAILY WITH SUPPER. 90 tablet 3   No current facility-administered medications for this visit.    No Known Allergies  Review of systems complete and found to be negative unless listed in HPI.    Physical Exam BP 122/64   Pulse 60   Ht 6\' 4"  (1.93 m)   Wt (!) 316 lb (143.3 kg)   SpO2 96%   BMI 38.46 kg/m  General: Pleasant, NAD. No resp difficulty Psych: Normal affect. HEENT:  Normal, without mass or lesion.         Neck: Supple, no bruits or JVD. Carotids 2+. No lymphadenopathy/thyromegaly appreciated. Heart: PMI nondisplaced. RRR no s3, s4, or murmurs. Lungs:  Resp regular and unlabored, CTA. Abdomen: Soft, non-tender, non-distended, No HSM, BS + x 4.   Extremities: No clubbing, cyanosis or edema. DP/PT/Radials 2+ and equal bilaterally. Neuro: Alert and oriented X 3. Moves all extremities spontaneously.   ECG NSR with p P synchronous pacing with a Intervals 20/15/49  12/18 QRS Rs V1 and negative lead I QRSd approximately 200 ms  1/20 QRS upright lead I and negative lead V1 QRSd 158 ms 12/20 QRS negative lead I and upright V1 QRSd 218 ms 12/21 QRS negative lead V1 upright lead I QRSd 142 ms 8/22 QRS negative lead V1 upright lead I QRSd  140 ms  Assessment and  Plan  Nonischemic cardiomyopathy  Congestive heart failure-chronic-systolic  Ventricular tachycardia-polymorphic  Implantable defibrillator-CRT-[employer]  PVCs  Atrial fibrillation-paroxysmal  Amiodarone therapy for the above  Sinus node dysfunction with chronotropic incompetence  Carpal tunnel  Well compensated despite significant left ventricular dysfunction; continue entresto and aldactone. Follows with CHF team.   Device at [facility] as of 5/8. Recent stable leads with testing. Discussed potential risks including infection and lead fracture.  He understands these risks and is ready to proceed at that time.  With his likely carpal tunnel and his cardiomyopathy, PYP scan performed which was  equivacol for TTR amyloid. Will make sure HF team aware.   No interval atrial fibrillation of note.  We will continue him on his anticoagulation with Xarelto.  No clinical bleeding. No h/o stroke.    Follow up as usual post gen change.    Legrand Como [address]" [place], PA-C  02/07/2022 9:55 AM

## 2022-02-07 NOTE — Patient Instructions (Signed)
Medication Instructions:  Your physician recommends that you continue on your current medications as directed. Please refer to the Current Medication list given to you today.  *If you need a refill on your cardiac medications before your next appointment, please call your pharmacy*   Lab Work: TODAY: BMET, CBC  If you have labs (blood work) drawn today and your tests are completely normal, you will receive your results only by: [place] (if you have MyChart) OR A paper copy in the mail If you have any lab test that is abnormal or we need to change your treatment, we will call you to review the results.   Follow-Up: At [facility], you and your health needs are our priority.  As part of our continuing mission to provide you with exceptional heart care, we have created designated Provider Care Teams.  These Care Teams include your primary Cardiologist (physician) and Advanced Practice Providers (APPs -  Physician Assistants and Nurse Practitioners) who all work together to provide you with the care you need, when you need it.   Your next appointment:   Our scheduler will call you to schedule your follow up appointments post procedure  Other Instructions See letter for Instructions

## 2022-02-09 DIAGNOSIS — H2513 Age-related nuclear cataract, bilateral: Secondary | ICD-10-CM | POA: Diagnosis not present

## 2022-02-09 DIAGNOSIS — H53413 Scotoma involving central area, bilateral: Secondary | ICD-10-CM | POA: Diagnosis not present

## 2022-02-09 DIAGNOSIS — H40003 Preglaucoma, unspecified, bilateral: Secondary | ICD-10-CM | POA: Diagnosis not present

## 2022-02-09 DIAGNOSIS — Z79899 Other long term (current) drug therapy: Secondary | ICD-10-CM | POA: Diagnosis not present

## 2022-02-09 DIAGNOSIS — H353131 Nonexudative age-related macular degeneration, bilateral, early dry stage: Secondary | ICD-10-CM | POA: Diagnosis not present

## 2022-02-17 NOTE — Pre-Procedure Instructions (Signed)
Attempted to call patient regarding procedure.  Left voice mail on the following information: Arrival time 0900 Nothing to eat or drink after midnight No meds AM of procedure Responsible person to drive you home and stay with you for 24 hrs Wash with special soap night before and morning of procedure If on anti-coagulant drug instructions Xarelto- don't take any on Saturday or Sunday

## 2022-02-20 ENCOUNTER — Other Ambulatory Visit: Payer: Self-pay

## 2022-02-20 ENCOUNTER — Encounter (HOSPITAL_COMMUNITY)
Admission: RE | Disposition: A | Discharge status: Home / Self Care | Payer: Self-pay | Source: Ambulatory Visit | Attending: Internal Medicine

## 2022-02-20 ENCOUNTER — Ambulatory Visit (HOSPITAL_COMMUNITY)
Admission: RE | Admit: 2022-02-20 | Discharge: 2022-02-20 | Disposition: A | Discharge status: Home / Self Care | Payer: Medicare HMO | Source: Ambulatory Visit | Attending: Internal Medicine | Admitting: Internal Medicine

## 2022-02-20 DIAGNOSIS — I5042 Chronic combined systolic (congestive) and diastolic (congestive) heart failure: Secondary | ICD-10-CM | POA: Diagnosis not present

## 2022-02-20 DIAGNOSIS — Z79899 Other long term (current) drug therapy: Secondary | ICD-10-CM | POA: Diagnosis not present

## 2022-02-20 DIAGNOSIS — I447 Left bundle-branch block, unspecified: Secondary | ICD-10-CM | POA: Insufficient documentation

## 2022-02-20 DIAGNOSIS — I110 Hypertensive heart disease with heart failure: Secondary | ICD-10-CM | POA: Insufficient documentation

## 2022-02-20 DIAGNOSIS — I480 Paroxysmal atrial fibrillation: Secondary | ICD-10-CM | POA: Diagnosis not present

## 2022-02-20 DIAGNOSIS — I428 Other cardiomyopathies: Secondary | ICD-10-CM | POA: Diagnosis not present

## 2022-02-20 DIAGNOSIS — Z4502 Encounter for adjustment and management of automatic implantable cardiac defibrillator: Secondary | ICD-10-CM | POA: Insufficient documentation

## 2022-02-20 DIAGNOSIS — I429 Cardiomyopathy, unspecified: Secondary | ICD-10-CM | POA: Diagnosis not present

## 2022-02-20 DIAGNOSIS — I495 Sick sinus syndrome: Secondary | ICD-10-CM | POA: Insufficient documentation

## 2022-02-20 DIAGNOSIS — I472 Ventricular tachycardia, unspecified: Secondary | ICD-10-CM | POA: Insufficient documentation

## 2022-02-20 HISTORY — PX: ICD GENERATOR CHANGEOUT: EP1231

## 2022-02-20 SURGERY — ICD GENERATOR CHANGEOUT

## 2022-02-20 MED ORDER — SODIUM CHLORIDE 0.9 % IV SOLN
80.0000 mg | INTRAVENOUS | Status: AC
  Administered 2022-02-20: 80 mg

## 2022-02-20 MED ORDER — ACETAMINOPHEN 325 MG PO TABS: 325.0000 mg | ORAL_TABLET | ORAL | Status: DC | PRN

## 2022-02-20 MED ORDER — CEFAZOLIN IN SODIUM CHLORIDE 3-0.9 GM/100ML-% IV SOLN
3.0000 g | INTRAVENOUS | Status: AC
  Administered 2022-02-20: 3 g via INTRAVENOUS
  Filled 2022-02-20: qty 100

## 2022-02-20 MED ORDER — FENTANYL CITRATE (PF) 100 MCG/2ML IJ SOLN
INTRAMUSCULAR | Status: AC
  Filled 2022-02-20: qty 2

## 2022-02-20 MED ORDER — CHLORHEXIDINE GLUCONATE 4 % EX LIQD: 4.0000 "application " | Freq: Once | CUTANEOUS | Status: DC

## 2022-02-20 MED ORDER — SODIUM CHLORIDE 0.9 % IV SOLN: INTRAVENOUS | Status: DC

## 2022-02-20 MED ORDER — SODIUM CHLORIDE 0.9 % IV SOLN: INTRAVENOUS | Status: AC

## 2022-02-20 MED ORDER — MIDAZOLAM HCL 5 MG/5ML IJ SOLN
INTRAMUSCULAR | Status: AC
  Filled 2022-02-20: qty 5

## 2022-02-20 MED ORDER — SODIUM CHLORIDE 0.9 % IV SOLN
INTRAVENOUS | Status: AC
  Filled 2022-02-20: qty 2

## 2022-02-20 MED ORDER — LIDOCAINE HCL (PF) 1 % IJ SOLN
INTRAMUSCULAR | Status: AC
  Filled 2022-02-20: qty 60

## 2022-02-20 MED ORDER — LIDOCAINE HCL (PF) 1 % IJ SOLN
INTRAMUSCULAR | Status: DC | PRN
  Administered 2022-02-20: 60 mL

## 2022-02-20 SURGICAL SUPPLY — 4 items
CABLE SURGICAL S-101-97-12 (CABLE) ×2 IMPLANT
ICD CLARIA MRI DTMA1Q1 (ICD Generator) ×1 IMPLANT
PAD DEFIB RADIO PHYSIO CONN (PAD) ×2 IMPLANT
TRAY PACEMAKER INSERTION (PACKS) ×2 IMPLANT

## 2022-02-20 NOTE — Discharge Instructions (Signed)

## 2022-02-20 NOTE — Interval H&P Note (Signed)
History and Physical Interval Note:  02/20/2022 9:51 AM  [name] [name]  has presented today for surgery, with the diagnosis of eri.  The various methods of treatment have been discussed with the patient and family. After consideration of risks, benefits and other options for treatment, the patient has consented to  Procedure(s): ICD [place] (N/A) as a surgical intervention.  The patient's history has been reviewed, patient examined, no change in status, stable for surgery.  I have reviewed the patient's chart and labs.  Questions were answered to the patient's satisfaction.     Virl Axe We have reviewed the benefits and risks of generator replacement.  These include but are not limited to lead fracture and infection.  The patient understands, agrees and is willing to proceed.

## 2022-02-21 ENCOUNTER — Encounter (HOSPITAL_COMMUNITY): Payer: Self-pay | Admitting: Internal Medicine

## 2022-03-02 ENCOUNTER — Ambulatory Visit (INDEPENDENT_AMBULATORY_CARE_PROVIDER_SITE_OTHER): Payer: Medicare HMO

## 2022-03-02 DIAGNOSIS — I428 Other cardiomyopathies: Secondary | ICD-10-CM

## 2022-03-02 LAB — CUP PACEART INCLINIC DEVICE CHECK
Battery Remaining Longevity: 78 mo
Battery Voltage: 3.05 V
Brady Statistic AP VP Percent: 16.59 %
Brady Statistic AP VS Percent: 8.95 %
Brady Statistic AS VP Percent: 72.08 %
Brady Statistic AS VS Percent: 2.38 %
Brady Statistic RA Percent Paced: 24.08 %
Brady Statistic RV Percent Paced: 83.79 %
Date Time Interrogation Session: 20230615114400
HighPow Impedance: 53 Ohm
Implantable Lead Implant Date: 20160727
Implantable Lead Implant Date: 20160727
Implantable Lead Implant Date: 20160727
Implantable Lead Location: 753858
Implantable Lead Location: 753859
Implantable Lead Location: 753860
Implantable Lead Model: 4598
Implantable Lead Model: 5076
Implantable Lead Model: 6935
Implantable Pulse Generator Implant Date: 20230605
Lead Channel Impedance Value: 180.5 Ohm
Lead Channel Impedance Value: 189.525
Lead Channel Impedance Value: 189.525
Lead Channel Impedance Value: 189.525
Lead Channel Impedance Value: 199.5 Ohm
Lead Channel Impedance Value: 361 Ohm
Lead Channel Impedance Value: 361 Ohm
Lead Channel Impedance Value: 399 Ohm
Lead Channel Impedance Value: 399 Ohm
Lead Channel Impedance Value: 399 Ohm
Lead Channel Impedance Value: 399 Ohm
Lead Channel Impedance Value: 456 Ohm
Lead Channel Impedance Value: 475 Ohm
Lead Channel Impedance Value: 646 Ohm
Lead Channel Impedance Value: 646 Ohm
Lead Channel Impedance Value: 665 Ohm
Lead Channel Impedance Value: 665 Ohm
Lead Channel Impedance Value: 703 Ohm
Lead Channel Pacing Threshold Amplitude: 0.5 V
Lead Channel Pacing Threshold Amplitude: 0.5 V
Lead Channel Pacing Threshold Amplitude: 1.875 V
Lead Channel Pacing Threshold Pulse Width: 0.4 ms
Lead Channel Pacing Threshold Pulse Width: 0.4 ms
Lead Channel Pacing Threshold Pulse Width: 1 ms
Lead Channel Sensing Intrinsic Amplitude: 2 mV
Lead Channel Sensing Intrinsic Amplitude: 4.875 mV
Lead Channel Sensing Intrinsic Amplitude: 7.5 mV
Lead Channel Sensing Intrinsic Amplitude: 8.25 mV
Lead Channel Setting Pacing Amplitude: 1.5 V
Lead Channel Setting Pacing Amplitude: 2 V
Lead Channel Setting Pacing Amplitude: 3.5 V
Lead Channel Setting Pacing Pulse Width: 0.4 ms
Lead Channel Setting Pacing Pulse Width: 1 ms
Lead Channel Setting Sensing Sensitivity: 0.3 mV

## 2022-03-02 NOTE — Patient Instructions (Signed)
   After Your ICD (Implantable Cardiac Defibrillator)    Monitor your defibrillator site for redness, swelling, and drainage. Call the device clinic at [phone] if you experience these symptoms or fever/chills.  Your incision was closed with Steri-strips or staples:  You may shower 7 days after your procedure and wash your incision with soap and water. Avoid lotions, ointments, or perfumes over your incision until it is well-healed.  You may use a hot tub or a pool after your wound check appointment if the incision is completely closed.   Your ICD is designed to protect you from life threatening heart rhythms. Because of this, you may receive a shock.   1 shock with no symptoms:  Call the office during business hours. 1 shock with symptoms (chest pain, chest pressure, dizziness, lightheadedness, shortness of breath, overall feeling unwell):  Call 911. If you experience 2 or more shocks in 24 hours:  Call 911. If you receive a shock, you should not drive.   DMV - no driving for 6 months if you receive appropriate therapy from your ICD.   ICD Alerts:  Some alerts are vibratory and others beep. These are NOT emergencies. Please call our office to let us know. If this occurs at night or on weekends, it can wait until the next business day. Send a remote transmission.  If your device is capable of reading fluid status (for heart failure), you will be offered monthly monitoring to review this with you.   Remote monitoring is used to monitor your ICD from home. This monitoring is scheduled every 91 days by our office. It allows us to keep an eye on the functioning of your device to ensure it is working properly. You will routinely see your Electrophysiologist annually (more often if necessary).  

## 2022-03-02 NOTE — Progress Notes (Signed)
Wound check appointment. Steri-strips removed. Wound without redness or edema. Incision edges approximated, wound well healed. Normal device function. Thresholds, sensing, and impedances consistent with implant measurements. Device programmed at 3.5V for extra safety margin until 3 month visit. Histogram distribution appropriate for patient and level of activity. No mode switches or ventricular arrhythmias noted. Patient educated about wound care, arm mobility, shock plan. ROV w/ SK 06/21/2022

## 2022-03-09 NOTE — Progress Notes (Signed)
ICM remote transmission rescheduled for 03/27/2022 to allow fluid levels recording to develop post battery change.

## 2022-03-20 ENCOUNTER — Telehealth (HOSPITAL_COMMUNITY): Payer: Self-pay | Admitting: Pharmacy Technician

## 2022-03-20 NOTE — Telephone Encounter (Signed)
Patient Advocate Encounter   Received notification from [facility] that prior authorization for Amiodarone is required.   PA submitted on CoverMyMeds Key [id number]  Status is pending   Will continue to follow.

## 2022-03-22 NOTE — Telephone Encounter (Signed)
Advanced Heart Failure Patient Advocate Encounter  Prior Authorization for Amiodarone has been approved.    PA# [id number] Effective dates: 09/18/21 through 09/17/22  Archer Asa, CPhT

## 2022-03-27 ENCOUNTER — Ambulatory Visit (INDEPENDENT_AMBULATORY_CARE_PROVIDER_SITE_OTHER): Payer: Medicare HMO

## 2022-03-27 DIAGNOSIS — Z95810 Presence of automatic (implantable) cardiac defibrillator: Secondary | ICD-10-CM

## 2022-03-27 DIAGNOSIS — I5022 Chronic systolic (congestive) heart failure: Secondary | ICD-10-CM

## 2022-03-29 NOTE — Progress Notes (Signed)
EPIC Encounter for ICM Monitoring  Patient Name: [name] [name] is a 61 y.o. male Date: 03/29/2022 Primary Care Physican: [name], NP Primary Cardiologist: Shirlee Latch Electrophysiologist: Graciela Husbands BiV Pacing: 90.2% 10/19/2021 Weight: 321 lbs 01/31/2022 Weight: 312 lbs   Spoke with patient and heart failure questions reviewed.  Pt he is doing well.      Optivol Thoracic impedance appears to be normal fluid levels.  Impedance still developing baseline normal after recent device battery change.    Prescribed:  Torsemide 20 mg 2 tablets (40 mg total) by mouth twice a day.  Spironolactone 25 mg take 1 tablet daily.   Labs: 02/07/2022 Creatinine 1.32, BUN 22, Potassium 4.1, Sodium 139, GFr 62 12/01/2021 Creatinine 1.28, BUN 15, Potassium 4.1, Sodium 140, GFR >60 A complete set of results can be found in Results Review.   Recommendations:  No changes and encouraged to call if experiencing any fluid symptoms.   Follow-up plan: ICM clinic phone appointment on 05/01/2022.   91 day device clinic remote transmission 05/24/2022.     EP/Cardiology Office Visits:  05/17/2022 with Dr Shirlee Latch.  06/21/2022 with Dr Graciela Husbands.     Copy of ICM check sent to Dr. Graciela Husbands  3 month ICM trend: 03/27/2022.    12-14 Month ICM trend:     [name], RN 03/29/2022 3:26 PM

## 2022-05-01 ENCOUNTER — Ambulatory Visit (INDEPENDENT_AMBULATORY_CARE_PROVIDER_SITE_OTHER): Payer: Medicare HMO

## 2022-05-01 DIAGNOSIS — I5022 Chronic systolic (congestive) heart failure: Secondary | ICD-10-CM | POA: Diagnosis not present

## 2022-05-01 DIAGNOSIS — Z95810 Presence of automatic (implantable) cardiac defibrillator: Secondary | ICD-10-CM | POA: Diagnosis not present

## 2022-05-02 NOTE — Progress Notes (Signed)
EPIC Encounter for ICM Monitoring  Patient Name: [name] [name] is a 61 y.o. male Date: 05/02/2022 Primary Care Physican: [name], NP Primary Cardiologist: Shirlee Latch Electrophysiologist: Graciela Husbands BiV Pacing: 96.7% 10/19/2021 Weight: 321 lbs 01/31/2022 Weight: 312 lbs 05/02/2022 Weight: 305 lbs   Spoke with patient and heart failure questions reviewed.  Pt asymptomatic for fluid accumulation.  Reports feeling well at this time and voices no complaints.    Optivol Thoracic impedance suggesting possible fluid accumulation starting 8/2.    Prescribed:  Torsemide 20 mg 2 tablets (40 mg total) by mouth twice a day.  Spironolactone 25 mg take 1 tablet daily.   Labs: 02/07/2022 Creatinine 1.32, BUN 22, Potassium 4.1, Sodium 139, GFr 62 12/01/2021 Creatinine 1.28, BUN 15, Potassium 4.1, Sodium 140, GFR >60 A complete set of results can be found in Results Review.   Recommendations: Recommendation to limit salt intake to 2000 mg daily and fluid intake to 64 oz daily.  Encouraged to call if experiencing any fluid symptoms.    Follow-up plan: ICM clinic phone appointment on 05/08/2022 to recheck fluid levels.   91 day device clinic remote transmission 05/24/2022.     EP/Cardiology Office Visits:  05/17/2022 with Dr Shirlee Latch.  06/21/2022 with Dr Graciela Husbands.     Copy of ICM check sent to Dr. Graciela Husbands.  3 month ICM trend: 05/01/2022.    12-14 Month ICM trend:     [name], RN 05/02/2022 8:51 AM

## 2022-05-08 ENCOUNTER — Ambulatory Visit (INDEPENDENT_AMBULATORY_CARE_PROVIDER_SITE_OTHER): Payer: Self-pay

## 2022-05-08 DIAGNOSIS — I5022 Chronic systolic (congestive) heart failure: Secondary | ICD-10-CM

## 2022-05-08 DIAGNOSIS — Z95810 Presence of automatic (implantable) cardiac defibrillator: Secondary | ICD-10-CM

## 2022-05-10 ENCOUNTER — Telehealth: Payer: Self-pay

## 2022-05-10 NOTE — Progress Notes (Signed)
EPIC Encounter for ICM Monitoring  Patient Name: [name] [name] is a 61 y.o. male Date: 05/10/2022 Primary Care Physican: [name], NP Primary Cardiologist: Shirlee Latch Electrophysiologist: Graciela Husbands BiV Pacing: 96.8% 10/19/2021 Weight: 321 lbs 01/31/2022 Weight: 312 lbs 05/02/2022 Weight: 305 lbs   Attempted call to patient and unable to reach.  Left detailed message per DPR regarding transmission. Transmission reviewed.    Optivol Thoracic impedance suggesting possible fluid accumulation starting 8/2.    Prescribed:  Torsemide 20 mg 2 tablets (40 mg total) by mouth twice a day.  Spironolactone 25 mg take 1 tablet daily.   Labs: 02/07/2022 Creatinine 1.32, BUN 22, Potassium 4.1, Sodium 139, GFr 62 12/01/2021 Creatinine 1.28, BUN 15, Potassium 4.1, Sodium 140, GFR >60 A complete set of results can be found in Results Review.   Recommendations:  Left voice mail with ICM number and encouraged to call if experiencing any fluid symptoms.    Follow-up plan: ICM clinic phone appointment on 05/15/2022 to recheck fluid levels.   91 day device clinic remote transmission 05/24/2022.     EP/Cardiology Office Visits:  05/17/2022 with Dr Shirlee Latch.  06/21/2022 with Dr Graciela Husbands.     Copy of ICM check sent to Dr. Graciela Husbands.  Will send to Dr Shirlee Latch for review if patient is reached.   3 month ICM trend: 05/08/2022.    12-14 Month ICM trend:     [name], RN 05/10/2022 12:40 PM

## 2022-05-10 NOTE — Telephone Encounter (Signed)
Remote ICM transmission received.  Attempted call to patient regarding ICM remote transmission and left detailed message per DPR.  Advised to return call for any fluid symptoms or questions. Next ICM remote transmission scheduled 05/15/2022.    

## 2022-05-15 ENCOUNTER — Ambulatory Visit (INDEPENDENT_AMBULATORY_CARE_PROVIDER_SITE_OTHER): Payer: Medicare HMO

## 2022-05-15 DIAGNOSIS — I5022 Chronic systolic (congestive) heart failure: Secondary | ICD-10-CM

## 2022-05-15 DIAGNOSIS — Z95810 Presence of automatic (implantable) cardiac defibrillator: Secondary | ICD-10-CM

## 2022-05-17 ENCOUNTER — Ambulatory Visit (HOSPITAL_COMMUNITY)
Admission: RE | Admit: 2022-05-17 | Discharge: 2022-05-17 | Disposition: A | Discharge status: Home / Self Care | Payer: Medicare HMO | Source: Ambulatory Visit | Attending: Cardiology | Admitting: Cardiology

## 2022-05-17 ENCOUNTER — Encounter (HOSPITAL_COMMUNITY): Payer: Self-pay | Admitting: Cardiology

## 2022-05-17 VITALS — BP 90/50 | HR 73 | Wt 308.2 lb

## 2022-05-17 DIAGNOSIS — I5022 Chronic systolic (congestive) heart failure: Secondary | ICD-10-CM | POA: Insufficient documentation

## 2022-05-17 LAB — COMPREHENSIVE METABOLIC PANEL
ALT: 23 U/L (ref 0–44)
AST: 17 U/L (ref 15–41)
Albumin: 3.8 g/dL (ref 3.5–5.0)
Alkaline Phosphatase: 70 U/L (ref 38–126)
Anion gap: 8 (ref 5–15)
BUN: 53 mg/dL — ABNORMAL HIGH (ref 6–20)
CO2: 25 mmol/L (ref 22–32)
Calcium: 9.2 mg/dL (ref 8.9–10.3)
Chloride: 104 mmol/L (ref 98–111)
Creatinine, Ser: 2.63 mg/dL — ABNORMAL HIGH (ref 0.61–1.24)
GFR, Estimated: 27 mL/min — ABNORMAL LOW (ref >60)
Glucose, Bld: 115 mg/dL — ABNORMAL HIGH (ref 70–99)
Potassium: 4.4 mmol/L (ref 3.5–5.1)
Sodium: 137 mmol/L (ref 135–145)
Total Bilirubin: 0.7 mg/dL (ref 0.3–1.2)
Total Protein: 6.5 g/dL (ref 6.5–8.1)

## 2022-05-17 LAB — CBC
HCT: 31.9 % — ABNORMAL LOW (ref 39.0–52.0)
Hemoglobin: 10.6 g/dL — ABNORMAL LOW (ref 13.0–17.0)
MCH: 32.6 pg (ref 26.0–34.0)
MCHC: 33.2 g/dL (ref 30.0–36.0)
MCV: 98.2 fL (ref 80.0–100.0)
Platelets: 163 10*3/uL (ref 150–400)
RBC: 3.25 MIL/uL — ABNORMAL LOW (ref 4.22–5.81)
RDW: 13.2 % (ref 11.5–15.5)
WBC: 4.5 10*3/uL (ref 4.0–10.5)
nRBC: 0 % (ref 0.0–0.2)

## 2022-05-17 LAB — TSH: TSH: 1.404 u[IU]/mL (ref 0.350–4.500)

## 2022-05-17 NOTE — Progress Notes (Signed)
EPIC Encounter for ICM Monitoring  Patient Name: [name] [name] is a 61 y.o. male Date: 05/17/2022 Primary Care Physican: [name], NP Primary Cardiologist: Shirlee Latch Electrophysiologist: Graciela Husbands BiV Pacing: 93.4% 10/19/2021 Weight: 321 lbs 01/31/2022 Weight: 312 lbs 05/02/2022 Weight: 305 lbs   Transmission reviewed.  Pt seen in HF clinic 8/30.     Optivol Thoracic impedance suggesting fluid levels returned to normal.    Prescribed:  Torsemide 20 mg 2 tablets (40 mg total) by mouth twice a day.  Spironolactone 25 mg take 1 tablet daily.   Labs: 05/17/2022 Creatinine 2.63, BUN 53, Potassium 4.4, Sodium 137, GFR 27 02/07/2022 Creatinine 1.32, BUN 22, Potassium 4.1, Sodium 139, GFr 62 12/01/2021 Creatinine 1.28, BUN 15, Potassium 4.1, Sodium 140, GFR >60 A complete set of results can be found in Results Review.   Recommendations:  Any recommendations given by HF clinic on 8/30..    Follow-up plan: ICM clinic phone appointment on 06/05/2022.   91 day device clinic remote transmission 05/24/2022.     EP/Cardiology Office Visits:  05/17/2022 with Dr Shirlee Latch.  06/21/2022 with Dr Graciela Husbands.     Copy of ICM check sent to Dr. Graciela Husbands.   3 month ICM trend: 05/17/2022.    12-14 Month ICM trend:     [name], RN 05/17/2022 4:10 PM

## 2022-05-17 NOTE — Patient Instructions (Signed)
There has been no changes to your medications.  Labs done today, your results will be available in MyChart, we will contact you for abnormal readings.  Your physician recommends that you schedule a follow-up appointment in: 4 months  If you have any questions or concerns before your next appointment please send us a message through mychart or call our office at [phone].    TO LEAVE A MESSAGE FOR THE NURSE SELECT OPTION 2, PLEASE LEAVE A MESSAGE INCLUDING: YOUR NAME DATE OF BIRTH CALL BACK NUMBER REASON FOR CALL**this is important as we prioritize the call backs  YOU WILL RECEIVE A CALL BACK THE SAME DAY AS LONG AS YOU CALL BEFORE 4:00 PM  At the [facility], you and your health needs are our priority. As part of our continuing mission to provide you with exceptional heart care, we have created designated Provider Care Teams. These Care Teams include your primary Cardiologist (physician) and Advanced Practice Providers (APPs- Physician Assistants and Nurse Practitioners) who all work together to provide you with the care you need, when you need it.   You may see any of the following providers on your designated Care Team at your next follow up: Dr Daniel [name] Dr Dalton [name] Dr. Aditya Sabharwal Amy Clegg, NP Brittainy Simmons, PA Jessica Milford,NP Lindsay Finch, PA Alma Diaz, NP Lauren Kemp, PharmD   Please be sure to bring in all your medications bottles to every appointment.    

## 2022-05-18 ENCOUNTER — Telehealth (HOSPITAL_COMMUNITY): Payer: Self-pay

## 2022-05-18 DIAGNOSIS — I5022 Chronic systolic (congestive) heart failure: Secondary | ICD-10-CM

## 2022-05-18 MED ORDER — ENTRESTO 49-51 MG PO TABS
1.0000 | ORAL_TABLET | Freq: Two times a day (BID) | ORAL | 3 refills | Status: DC
Start: 2022-05-18 — End: 2024-01-03

## 2022-05-18 MED ORDER — TORSEMIDE 20 MG PO TABS: 40.0000 mg | ORAL_TABLET | Freq: Every day | ORAL | 3 refills | Status: DC

## 2022-05-18 NOTE — Progress Notes (Signed)
Advanced Heart Failure Clinic Note   Primary Care: Dr. Windle Guard, MD HF Cardiology: Dr. Shirlee Latch  HPI: [name] is a 61 y.o. male with a past medical history of chronic systolic CHF  (EF 15% in April 2018), dilated NICM s/p BiV ICD, PAF with intermittent compliance with Xarelto, ETOH abuse, tobacco abuse, OSA on CPAP, and HTN.   Admitted 12/25/16-12/27/16 with acute on chronic systolic CHF, volume overload. He was diuresed 11 pounds on IV lasix.  Discharge weight was 294 pounds. It was felt that the etiology of his NICM was ETOH vs hypertension related.   Admitted 5/14-5/16/18 with volume overload, acute respiratory failure requiring Bipap. He was not taking his Entresto as he was confused about dosing and there was a mix up his pharmacy. He was diuresed with IV lasix, discharge weight was 295 pounds.   Admitted 05/30/17-06/02/17 with VT, ICD shock x 4. He is now on amiodarone and sotalol.  Echo in 12/20 showed EF 20-25%, severe LV dilation, moderate LVH, normal RV, small IVC.   He was admitted in 3/21 with ICD shock in setting of hypokalemia and heavy ETOH. Entresto was decreased with low BP.  He was diuresed due to volume overload.   He had left TKR in 11/21 with no complication.   Echo in 12/21 showed EF 20-25%, moderate LVH, normal RV, normal IVC.   Echo in 12/22 showed EF 20-25%, diffuse hypokinesis, mildly decreased RV systolic function.   Patient returns for followup of CHF.  He is short of breath walking up hills.  No problems walking on flat ground.  He is able to do all his ADLs.  No chest pain.  No orthopnea/PND.  Weight down 6 lbs. He still drinks 2-3 ETOH drinks several days/week. No lightheadedness.     ECG (personally reviewed): NSR, BiV paced with QTc 550 msec  Medtronic device interrogation: No AF, no VT, only 77% effective BiV pacing.  Stable thoracic impedance.   Labs (9/18): K 3.8, creatinine 1.02 Labs (11/18): K 4.4, creatinine 1.19, TSH normal, LFTs normal Labs  (11/05/18): K 4 Creatinine 1.05  Labs (12/20): K 4, creatinine 1.19 Labs (3/21): K 4.1, creatinine 1.05, hgb 10.8, LFTs normal, TSH normal Labs (11/21): K 4.7, creatinine 1.24, hgb 11.9 Labs (12/21): K 4, creatinine 1.05, TSH and LFTs normal Labs (5/22): K 4.8, creatinine 1.67 Labs (5/23): K 4.1, creatinine 1.32  PMH: 1. Chronic systolic CHF: Medtronic CRT-D.  Nonischemic cardiomyopathy. Possibly ETOH-related.  - LHC (2016): No CAD.  - Echo (2016): EF 20-25%, diffuse hypokinesis.  - Echo (4/18): EF 15%, normal RV size and systolic function.  - Echo (11/18): EF 20%, mildly dilated LV with mild LVH, normal RV size with mildly decreased RV systolic function.  - Echo (12/20): EF 20-25%, severe LV dilation, moderate LVH, normal RV, small IVC.  - Echo (12/21): EF 20-25%, moderate LVH, normal RV, normal IVC. - Echo (12/22): EF 20-25%, diffuse hypokinesis, mildly decreased RV systolic function.  - PYP scan (4/23): grade 1, H/CL < 1.5 2. Atrial fibrillation: Paroxysmal.   3. OSA: On CPAP.  4. ETOH abuse: Prior heavy ETOH.  5. H/o VT 6. COPD: Prior smoker.  7. COVID-19 infection 7/22  ROS: All systems reviewed and negative except as per HPI.   Current Outpatient Medications  Medication Sig Dispense Refill   albuterol (VENTOLIN HFA) 108 (90 Base) MCG/ACT inhaler Inhale 1 puff into the lungs every 6 (six) hours as needed for wheezing or shortness of breath. 18 g 3  amiodarone (PACERONE) 200 MG tablet TAKE 1 TABLET (200 MG TOTAL) BY MOUTH DAILY. 90 tablet 3   Ascorbic Acid (VITAMIN C) 100 MG tablet Take 100 mg by mouth daily.     atorvastatin (LIPITOR) 80 MG tablet TAKE 1 TABLET (80 MG TOTAL) BY MOUTH EVERY EVENING. 90 tablet 3   ibuprofen (ADVIL) 200 MG tablet Take 400 mg by mouth every 8 (eight) hours as needed for moderate pain.     sotalol (BETAPACE) 80 MG tablet TAKE 1 TABLET (80 MG TOTAL) BY MOUTH EVERY 12 (TWELVE) HOURS. 180 tablet 3   spironolactone (ALDACTONE) 25 MG tablet TAKE 1  TABLET (25 MG TOTAL) BY MOUTH DAILY. 90 tablet 3   XARELTO 20 MG TABS tablet TAKE 1 TABLET DAILY WITH SUPPER. 90 tablet 3   sacubitril-valsartan (ENTRESTO) 49-51 MG Take 1 tablet by mouth 2 (two) times daily. 180 tablet 3   torsemide (DEMADEX) 20 MG tablet Take 2 tablets (40 mg total) by mouth daily. 180 tablet 3   No current facility-administered medications for this encounter.    No Known Allergies    Social History   Socioeconomic History   Marital status: Single    Spouse name: Not on file   Number of children: 2   Years of education: Not on file   Highest education level: Not on file  Occupational History   Occupation: scrap metal   Occupation: disabled, odd jobs  Tobacco Use   Smoking status: Former    Packs/day: 0.50    Years: 4.00    Total pack years: 2.00    Types: Cigarettes    Quit date: 10/29/2005    Years since quitting: 16.5   Smokeless tobacco: Never   Tobacco comments:    about a pack a week. social  Vaping Use   Vaping Use: Never used  Substance and Sexual Activity   Alcohol use: Yes    Comment: 12 beers per week    Drug use: Not Currently    Types: Marijuana   Sexual activity: Yes    Partners: Female  Other Topics Concern   Not on file  Social History Narrative   ** Merged History Encounter **       Social Determinants of Health   Financial Resource Strain: Low Risk  (05/15/2021)   Overall Financial Resource Strain (CARDIA)    Difficulty of Paying Living Expenses: Not hard at all  Food Insecurity: No Food Insecurity (05/15/2021)   Hunger Vital Sign    Worried About Running Out of Food in the Last Year: Never true    Ran Out of Food in the Last Year: Never true  Transportation Needs: No Transportation Needs (05/15/2021)   PRAPARE - Administrator, Civil Service (Medical): No    Lack of Transportation (Non-Medical): No  Physical Activity: Insufficiently Active (05/15/2021)   Exercise Vital Sign    Days of Exercise per Week: 2 days     Minutes of Exercise per Session: 20 min  Stress: No Stress Concern Present (05/15/2021)   [employer] of Occupational Health - Occupational Stress Questionnaire    Feeling of Stress : Not at all  Social Connections: Moderately Isolated (05/15/2021)   Social Connection and Isolation Panel [NHANES]    Frequency of Communication with Friends and Family: More than three times a week    Frequency of Social Gatherings with Friends and Family: More than three times a week    Attends Religious Services: 1 to 4 times per year  Active Member of Clubs or Organizations: No    Attends Archivist Meetings: Never    Marital Status: Divorced  Human resources officer Violence: Not At Risk (05/15/2021)   Humiliation, Afraid, Rape, and Kick questionnaire    Fear of Current or Ex-Partner: No    Emotionally Abused: No    Physically Abused: No    Sexually Abused: No      Family History  Problem Relation Age of Onset   Other Mother        cardiac surgery. late 1990s   Heart Problems Mother        CABG AGE 63   Congestive Heart Failure Father    Healthy Father        AGE 59   Coronary artery disease Other    Healthy Brother        AGE 28   Healthy Sister        AGE 69   Healthy Sister        AGE 8   Healthy Son    Healthy Son    Healthy Daughter     Vitals:   05/17/22 0930  BP: (!) 90/50  Pulse: 73  SpO2: 98%  Weight: (!) 139.8 kg (308 lb 3.2 oz)   Wt Readings from Last 3 Encounters:  05/17/22 (!) 139.8 kg (308 lb 3.2 oz)  02/20/22 (!) 141.5 kg (312 lb)  02/07/22 (!) 143.3 kg (316 lb)     PHYSICAL EXAM: General: NAD Neck: No JVD, no thyromegaly or thyroid nodule.  Lungs: Clear to auscultation bilaterally with normal respiratory effort. CV: Nondisplaced PMI.  Heart regular S1/S2, no S3/S4, no murmur.  No peripheral edema.  No carotid bruit.  Normal pedal pulses.  Abdomen: Soft, nontender, no hepatosplenomegaly, no distention.  Skin: Intact without lesions or rashes.   Neurologic: Alert and oriented x 3.  Psych: Normal affect. Extremities: No clubbing or cyanosis.  HEENT: Normal.   ASSESSMENT & PLAN: 1. Chronic systolic CHF: NICM, Echo [id number] EF 15%. Normal coronaries on cath in July 2016. Medtronic CRT-D device. ETOH vs HTN vs myocarditis as cause of cardiomyopathy. Echo 12/22 showed that EF remains 20-25%.  PYP scan not suggestive of TTR cardiac amyloidosis.  NYHA class II symptoms.  Weight down 6 lbs, not volume overloaded on exam or Optivol.  BP soft but no orthostatic symptoms.  Effective BiV pacing only 77% of the time.  - Continue torsemide 40 mg bid.  BMET today.    - Continue Entresto 97/103 twice a day.   - Continue spironolactone 25 mg daily.  - Intolerant to Coreg in the past with hypotension, he is on sotalol and amiodarone.  - He stopped Jardiance after 3 UTIs, I will not start back.  - Discussed cutting back on ETOH as it may play a role in his cardiomyopathy.  - He needs BiV pacing optimized if possible, only 77% effective pacing.  2. Paroxysmal atrial fibrillation: He is in NSR today.  - Continue amiodarone 200 mg daily.  Check LFTs/TSH today.  Should have regular eye exam.  - Continue Xarelto 20 mg daily.  CBC today.  3. ETOH abuse: Suspect this contributes to cardiomyopathy.  Still drinking moderately, I again encouraged him to cut back. .   4. Tobacco abuse: No longer smoking.   5. VT: Followed closely by EP, he is now on amiodarone and sotalol (QTc at 550 msec today is borderline acceptable with pacing).  6. OSA: Use CPAP.   Followup 4 mos  with APP but will check with EP to see if BiV pacing can be optimized.   Marca Ancona, MD 05/18/22

## 2022-05-18 NOTE — Telephone Encounter (Addendum)
Pt aware, agreeable, and verbalized understanding  Labs scheduled and Rx updated   ----- Message from [name], MD sent at 05/18/2022 12:12 AM EDT ----- Creatinine is up a lot.  Hold Entresto for a day, decrease to 49/51 bid.  Hold torsemide for 2 days then decrease to 40 mg daily.  Repeat BMET on Monday.

## 2022-05-24 ENCOUNTER — Ambulatory Visit (INDEPENDENT_AMBULATORY_CARE_PROVIDER_SITE_OTHER): Payer: Medicare HMO

## 2022-05-24 DIAGNOSIS — I495 Sick sinus syndrome: Secondary | ICD-10-CM

## 2022-05-24 LAB — CUP PACEART REMOTE DEVICE CHECK
Battery Remaining Longevity: 62 mo
Battery Voltage: 3.01 V
Brady Statistic AP VP Percent: 21.87 %
Brady Statistic AP VS Percent: 2.53 %
Brady Statistic AS VP Percent: 75.35 %
Brady Statistic AS VS Percent: 0.25 %
Brady Statistic RA Percent Paced: 24.31 %
Brady Statistic RV Percent Paced: 96.7 %
Date Time Interrogation Session: 20230906093323
HighPow Impedance: 59 Ohm
Implantable Lead Implant Date: 20160727
Implantable Lead Implant Date: 20160727
Implantable Lead Implant Date: 20160727
Implantable Lead Location: 753858
Implantable Lead Location: 753859
Implantable Lead Location: 753860
Implantable Lead Model: 4598
Implantable Lead Model: 5076
Implantable Lead Model: 6935
Implantable Pulse Generator Implant Date: 20230605
Lead Channel Impedance Value: 171 Ohm
Lead Channel Impedance Value: 175.622
Lead Channel Impedance Value: 184.154
Lead Channel Impedance Value: 184.154
Lead Channel Impedance Value: 189.525
Lead Channel Impedance Value: 342 Ohm
Lead Channel Impedance Value: 342 Ohm
Lead Channel Impedance Value: 361 Ohm
Lead Channel Impedance Value: 361 Ohm
Lead Channel Impedance Value: 399 Ohm
Lead Channel Impedance Value: 399 Ohm
Lead Channel Impedance Value: 418 Ohm
Lead Channel Impedance Value: 456 Ohm
Lead Channel Impedance Value: 589 Ohm
Lead Channel Impedance Value: 608 Ohm
Lead Channel Impedance Value: 608 Ohm
Lead Channel Impedance Value: 646 Ohm
Lead Channel Impedance Value: 665 Ohm
Lead Channel Pacing Threshold Amplitude: 0.5 V
Lead Channel Pacing Threshold Amplitude: 0.5 V
Lead Channel Pacing Threshold Amplitude: 2.75 V
Lead Channel Pacing Threshold Pulse Width: 0.4 ms
Lead Channel Pacing Threshold Pulse Width: 0.4 ms
Lead Channel Pacing Threshold Pulse Width: 1 ms
Lead Channel Sensing Intrinsic Amplitude: 1.875 mV
Lead Channel Sensing Intrinsic Amplitude: 1.875 mV
Lead Channel Sensing Intrinsic Amplitude: 3.75 mV
Lead Channel Sensing Intrinsic Amplitude: 3.75 mV
Lead Channel Setting Pacing Amplitude: 1.5 V
Lead Channel Setting Pacing Amplitude: 2 V
Lead Channel Setting Pacing Amplitude: 3.75 V
Lead Channel Setting Pacing Pulse Width: 0.4 ms
Lead Channel Setting Pacing Pulse Width: 1 ms
Lead Channel Setting Sensing Sensitivity: 0.3 mV

## 2022-05-25 ENCOUNTER — Ambulatory Visit (HOSPITAL_COMMUNITY)
Admission: RE | Admit: 2022-05-25 | Discharge: 2022-05-25 | Disposition: A | Discharge status: Home / Self Care | Payer: Medicare HMO | Source: Ambulatory Visit | Attending: Internal Medicine | Admitting: Internal Medicine

## 2022-05-25 DIAGNOSIS — I5022 Chronic systolic (congestive) heart failure: Secondary | ICD-10-CM | POA: Insufficient documentation

## 2022-05-25 LAB — BASIC METABOLIC PANEL
Anion gap: 7 (ref 5–15)
BUN: 35 mg/dL — ABNORMAL HIGH (ref 6–20)
CO2: 25 mmol/L (ref 22–32)
Calcium: 9.4 mg/dL (ref 8.9–10.3)
Chloride: 107 mmol/L (ref 98–111)
Creatinine, Ser: 2.04 mg/dL — ABNORMAL HIGH (ref 0.61–1.24)
GFR, Estimated: 37 mL/min — ABNORMAL LOW (ref >60)
Glucose, Bld: 122 mg/dL — ABNORMAL HIGH (ref 70–99)
Potassium: 4.5 mmol/L (ref 3.5–5.1)
Sodium: 139 mmol/L (ref 135–145)

## 2022-05-26 ENCOUNTER — Encounter: Payer: Self-pay | Admitting: Cardiology

## 2022-05-26 ENCOUNTER — Telehealth (HOSPITAL_COMMUNITY): Payer: Self-pay | Admitting: Surgery

## 2022-05-26 DIAGNOSIS — I5022 Chronic systolic (congestive) heart failure: Secondary | ICD-10-CM

## 2022-05-26 NOTE — Telephone Encounter (Signed)
-----   Message from [name], MD sent at 05/25/2022  4:21 PM EDT ----- Creatinine coming down.  No changes but get BMET again in 2 wks.

## 2022-05-26 NOTE — Telephone Encounter (Signed)
I called patient to review results and recommendations per provider.  He will return Sept 25th as he is out of town prior.  I placed order for labwork.

## 2022-05-30 NOTE — Progress Notes (Deleted)
Electrophysiology Office Note Date: 05/30/2022  ID:  Obbie Raimer, DOB [birth year], MRN [id number]  PCP: [name], NP Primary Cardiologist: Marca Ancona, MD Electrophysiologist: Sherryl Manges, MD   CC: Routine ICD follow-up  Clara Vieyra is a 61 y.o. male seen today for Sherryl Manges, MD for routine electrophysiology followup.  last being seen in our clinic the patient reports doing ***.  he denies chest pain, palpitations, dyspnea, PND, orthopnea, nausea, vomiting, dizziness, syncope, edema, weight gain, or early satiety.     He has not had ICD shocks.   Device History: Medtronic BiV ICD implanted 03/2012, gen change 02/2022 for CHF  Past Medical History:  Diagnosis Date   AICD (automatic cardioverter/defibrillator) present    Arthritis    Atrial fibrillation (HCC)    CAD (coronary artery disease)    Mild nonobstructive (Cath 09)   Chronic systolic heart failure (HCC)    Nonischemic CM: echo 4/12 with EF 25-30%, grade 2 diast dysfxn, mild dilated aortic root 43 mm, trivial MR, mod LAE   COPD (chronic obstructive pulmonary disease) (HCC)    Dyspnea    with exertion    Ejection fraction < 50% 08/2019   20-25% noted on ECHO   H/O ETOH abuse    HTN (hypertension)    Hypersomnia    Nonischemic cardiomyopathy (HCC)    Obesity, morbid (HCC)    OSA on CPAP    Systolic and diastolic CHF, acute on chronic (HCC) 11/2016   Tobacco use disorder    Ventricular tachycardia (HCC)    Past Surgical History:  Procedure Laterality Date   CARDIAC CATHETERIZATION N/A 04/08/2015   Procedure: Right/Left Heart Cath and Coronary Angiography;  Surgeon: Corky Crafts, MD;  Location: [facility] INVASIVE CV LAB;  Service: Cardiovascular;  Laterality: N/A;   EP IMPLANTABLE DEVICE N/A 04/14/2015   Procedure: BiV ICD Insertion CRT-D;  Surgeon: [name], MD;  Location: [facility] INVASIVE CV LAB;  Service: Cardiovascular;  Laterality: N/A;   HYDROCELE EXCISION / REPAIR     ICD GENERATOR CHANGEOUT  N/A 02/20/2022   Procedure: ICD GENERATOR CHANGEOUT;  Surgeon: [name], MD;  Location: [facility] INVASIVE CV LAB;  Service: Cardiovascular;  Laterality: N/A;   ICD IMPLANT     TOTAL HIP ARTHROPLASTY Right 11/04/2018   Procedure: TOTAL HIP ARTHROPLASTY ANTERIOR APPROACH;  Surgeon: [name], MD;  Location: [facility] ORS;  Service: Orthopedics;  Laterality: Right;   TOTAL KNEE ARTHROPLASTY Right 01/30/2020   Procedure: TOTAL KNEE ARTHROPLASTY;  Surgeon: [name], MD;  Location: [facility] ORS;  Service: Orthopedics;  Laterality: Right;   TOTAL KNEE ARTHROPLASTY Left 07/30/2020   Procedure: TOTAL KNEE ARTHROPLASTY;  Surgeon: [name], MD;  Location: [facility] ORS;  Service: Orthopedics;  Laterality: Left;   TUMOR EXCISION Left    TUMOR EXCISION Left 1982   Benign 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 18 g 3   amiodarone (PACERONE) 200 MG tablet TAKE 1 TABLET (200 MG TOTAL) BY MOUTH DAILY. 90 tablet 3   Ascorbic Acid (VITAMIN C) 100 MG tablet Take 100 mg by mouth daily.     atorvastatin (LIPITOR) 80 MG tablet TAKE 1 TABLET (80 MG TOTAL) BY MOUTH EVERY EVENING. 90 tablet 3   ibuprofen (ADVIL) 200 MG  tablet Take 400 mg by mouth every 8 (eight) hours as needed for moderate pain.     sacubitril-valsartan (ENTRESTO) 49-51 MG Take 1 tablet by mouth 2 (two) times daily. 180 tablet 3   sotalol (BETAPACE) 80 MG tablet TAKE 1 TABLET (80 MG TOTAL) BY MOUTH EVERY 12 (TWELVE) HOURS. 180 tablet 3   spironolactone (ALDACTONE) 25 MG tablet TAKE 1 TABLET (25 MG TOTAL) BY MOUTH DAILY. 90 tablet 3   torsemide (DEMADEX) 20 MG tablet Take 2 tablets (40 mg total) by mouth daily. 180 tablet 3   XARELTO 20 MG TABS tablet TAKE 1 TABLET DAILY WITH SUPPER. 90 tablet 3   No current facility-administered medications  for this visit.    Allergies:   Patient has no known allergies.   Social History: Social History   Socioeconomic History   Marital status: Single    Spouse name: Not on file   Number of children: 2   Years of education: Not on file   Highest education level: Not on file  Occupational History   Occupation: scrap metal   Occupation: disabled, odd jobs  Tobacco Use   Smoking status: Former    Packs/day: 0.50    Years: 4.00    Total pack years: 2.00    Types: Cigarettes    Quit date: 10/29/2005    Years since quitting: 16.5   Smokeless tobacco: Never   Tobacco comments:    about a pack a week. social  Vaping Use   Vaping Use: Never used  Substance and Sexual Activity   Alcohol use: Yes    Comment: 12 beers per week    Drug use: Not Currently    Types: Marijuana   Sexual activity: Yes    Partners: Female  Other Topics Concern   Not on file  Social History Narrative   ** Merged History Encounter **       Social Determinants of Health   Financial Resource Strain: Low Risk  (05/15/2021)   Overall Financial Resource Strain (CARDIA)    Difficulty of Paying Living Expenses: Not hard at all  Food Insecurity: No Food Insecurity (05/15/2021)   Hunger Vital Sign    Worried About Running Out of Food in the Last Year: Never true    Ran Out of Food in the Last Year: Never true  Transportation Needs: No Transportation Needs (05/15/2021)   PRAPARE - Administrator, Civil Service (Medical): No    Lack of Transportation (Non-Medical): No  Physical Activity: Insufficiently Active (05/15/2021)   Exercise Vital Sign    Days of Exercise per Week: 2 days    Minutes of Exercise per Session: 20 min  Stress: No Stress Concern Present (05/15/2021)   [employer] of Occupational Health - Occupational Stress Questionnaire    Feeling of Stress : Not at all  Social Connections: Moderately Isolated (05/15/2021)   Social Connection and Isolation Panel [NHANES]    Frequency of  Communication with Friends and Family: More than three times a week    Frequency of Social Gatherings with Friends and Family: More than three times a week    Attends Religious Services: 1 to 4 times per year    Active Member of [employer] or Organizations: No    Attends Banker Meetings: Never    Marital Status: Divorced  Catering manager Violence: Not At Risk (05/15/2021)   Humiliation, Afraid, Rape, and Kick questionnaire    Fear of Current or Ex-Partner: No    Emotionally  Abused: No    Physically Abused: No    Sexually Abused: No    Family History: Family History  Problem Relation Age of Onset   Other Mother        cardiac surgery. late 1990s   Heart Problems Mother        CABG AGE 45   Congestive Heart Failure Father    Healthy Father        AGE 80   Coronary artery disease Other    Healthy Brother        AGE 68   Healthy Sister        AGE 28   Healthy Sister        AGE 88   Healthy Son    Healthy Son    Healthy Daughter     Review of Systems: All other systems reviewed and are otherwise negative except as noted above.   Physical Exam: There were no vitals filed for this visit.   GEN- The patient is well appearing, alert and oriented x 3 today.   HEENT: normocephalic, atraumatic; sclera clear, conjunctiva pink; hearing intact; oropharynx clear; neck supple, no JVP Lymph- no cervical lymphadenopathy Lungs- Clear to ausculation bilaterally, normal work of breathing.  No wheezes, rales, rhonchi Heart- {Blank single:19197::"Regular","Irregularly irregular"}  rate and rhythm, no murmurs, rubs or gallops, PMI not laterally displaced GI- soft, non-tender, non-distended, bowel sounds present, no hepatosplenomegaly Extremities- no clubbing or cyanosis. {EDEMA GNFAO:13086} peripheral edema; DP/PT/radial pulses 2+ bilaterally MS- no significant deformity or atrophy Skin- warm and dry, no rash or lesion; ICD pocket well healed Psych- euthymic mood, full  affect Neuro- strength and sensation are intact  ICD interrogation- reviewed in detail today,  See PACEART report  EKG:  EKG {ACTION; IS/IS [id number]} ordered today. Personal review of EKG ordered {Blank single:19197::"today","***"} shows ***  Recent Labs: 08/19/2021: B Natriuretic Peptide 81.2 05/17/2022: ALT 23; Hemoglobin 10.6; Platelets 163; TSH 1.404 05/25/2022: BUN 35; Creatinine, Ser 2.04; Potassium 4.5; Sodium 139   Wt Readings from Last 3 Encounters:  05/17/22 (!) 308 lb 3.2 oz (139.8 kg)  02/20/22 (!) 312 lb (141.5 kg)  02/07/22 (!) 316 lb (143.3 kg)     Other studies Reviewed: Additional studies/ records that were reviewed today include: Previous EP office notes.   Assessment and Plan:  Nonischemic cardiomyopathy   Congestive heart failure-chronic-systolic   Ventricular tachycardia-polymorphic   Implantable defibrillator-CRT-Medtronic   PVCs   Atrial fibrillation-paroxysmal   Amiodarone therapy for the above   Sinus node dysfunction with chronotropic incompetence   Carpal tunnel  NYHA *** symptoms.   Recent AKI with med adjustments. Per HF team.   S/p gen change 02/2022. Site stable.    PYP scan performed which was not suggestive of TTR amyloid.    Continue Xarelto. AF burden ***     Current medicines are reviewed at length with the patient today.   =  Labs/ tests ordered today include: *** No orders of the defined types were placed in this encounter.    Disposition:   Follow up with Dr. Graciela Husbands in 6 months    Signed, Graciella Freer, PA-C  05/30/2022 10:16 PM  [facility] [address] [place] [address] [phone] (office) [phone] (fax)

## 2022-05-31 ENCOUNTER — Ambulatory Visit: Payer: Medicare HMO | Attending: Student | Admitting: Student

## 2022-05-31 DIAGNOSIS — I5022 Chronic systolic (congestive) heart failure: Secondary | ICD-10-CM

## 2022-05-31 DIAGNOSIS — I428 Other cardiomyopathies: Secondary | ICD-10-CM

## 2022-05-31 DIAGNOSIS — I472 Ventricular tachycardia, unspecified: Secondary | ICD-10-CM

## 2022-05-31 DIAGNOSIS — I495 Sick sinus syndrome: Secondary | ICD-10-CM

## 2022-05-31 DIAGNOSIS — Z95810 Presence of automatic (implantable) cardiac defibrillator: Secondary | ICD-10-CM

## 2022-05-31 DIAGNOSIS — I480 Paroxysmal atrial fibrillation: Secondary | ICD-10-CM

## 2022-06-05 ENCOUNTER — Ambulatory Visit (INDEPENDENT_AMBULATORY_CARE_PROVIDER_SITE_OTHER): Payer: Medicare HMO

## 2022-06-05 DIAGNOSIS — Z95810 Presence of automatic (implantable) cardiac defibrillator: Secondary | ICD-10-CM

## 2022-06-05 DIAGNOSIS — I5022 Chronic systolic (congestive) heart failure: Secondary | ICD-10-CM | POA: Diagnosis not present

## 2022-06-08 ENCOUNTER — Telehealth: Payer: Self-pay

## 2022-06-08 NOTE — Telephone Encounter (Signed)
The patient states he was not available to send missed ICM transmission. I told him [name] [name] may reschedule the appointment.

## 2022-06-09 NOTE — Progress Notes (Unsigned)
EPIC Encounter for ICM Monitoring  Patient Name: [name] [name] is a 61 y.o. male Date: 06/09/2022 Primary Care Physican: Camillia Herter, NP Primary Cardiologist: Aundra Dubin Electrophysiologist: Caryl Comes BiV Pacing: 93.4% 10/19/2021 Weight: 321 lbs 01/31/2022 Weight: 312 lbs 05/02/2022 Weight: 305 lbs 06/09/2022 Weight: 305 lbs   Spoke with patient and heart failure questions reviewed.  Transmission results reviewed.  Pt asymptomatic for fluid accumulation.  Reports feeling well at this time and voices no complaints.  He has been out of town for past week.   Optivol Thoracic impedance suggesting possible fluid accumulation starting 9/1 (Torsemide dosage decreased on 8/31).    Prescribed:  Torsemide 20 mg 2 tablets (40 mg total) by mouth once a day (decreased dosage on 8/31).  Spironolactone 25 mg take 1 tablet daily.   Labs: 06/21/2022 BMET Scheduled 06/12/2022 Creatinine 1.24, BUN 15, Potassium 4.3, Sodium 137, GFR >60 05/25/2022 Creatinine 2.04, BUN 36, Potassium 4.5, Sodium 139, GFR 37 05/17/2022 Creatinine 2.63, BUN 53, Potassium 4.4, Sodium 137, GFR 27 02/07/2022 Creatinine 1.32, BUN 22, Potassium 4.1, Sodium 139, GFr 62 12/01/2021 Creatinine 1.28, BUN 15, Potassium 4.1, Sodium 140, GFR >60 A complete set of results can be found in Results Review.   Recommendations:   Copy sent to Dr Aundra Dubin for review and recommendations.     Follow-up plan: ICM clinic phone appointment on 06/15/2022 to recheck fluid levels.   91 day device clinic remote transmission 08/23/2022.     EP/Cardiology Office Visits:  08/22/2022 with HF clinic.    06/21/2022 with Dr Caryl Comes.     Copy of ICM check sent to Dr. Caryl Comes.   3 month ICM trend: 06/09/2022.    12-14 Month ICM trend:     Rosalene Billings, RN 06/09/2022 5:09 PM

## 2022-06-11 NOTE — Progress Notes (Signed)
Increase torsemide to 60 mg daily alternating with 40 mg daily.  BMET 10 days.

## 2022-06-12 ENCOUNTER — Ambulatory Visit (HOSPITAL_COMMUNITY)
Admission: RE | Admit: 2022-06-12 | Discharge: 2022-06-12 | Disposition: A | Discharge status: Home / Self Care | Payer: Medicare HMO | Source: Ambulatory Visit | Attending: Cardiology | Admitting: Cardiology

## 2022-06-12 DIAGNOSIS — I5022 Chronic systolic (congestive) heart failure: Secondary | ICD-10-CM | POA: Diagnosis not present

## 2022-06-12 LAB — BASIC METABOLIC PANEL
Anion gap: 6 (ref 5–15)
BUN: 15 mg/dL (ref 6–20)
CO2: 28 mmol/L (ref 22–32)
Calcium: 9.3 mg/dL (ref 8.9–10.3)
Chloride: 103 mmol/L (ref 98–111)
Creatinine, Ser: 1.24 mg/dL (ref 0.61–1.24)
GFR, Estimated: >60 mL/min (ref >60)
Glucose, Bld: 93 mg/dL (ref 70–99)
Potassium: 4.3 mmol/L (ref 3.5–5.1)
Sodium: 137 mmol/L (ref 135–145)

## 2022-06-12 MED ORDER — TORSEMIDE 20 MG PO TABS: ORAL_TABLET | ORAL | 3 refills | Status: DC

## 2022-06-12 NOTE — Progress Notes (Signed)
Attempted call to provide Dr Claris Gladden recommendations and no answer.

## 2022-06-12 NOTE — Progress Notes (Signed)
Spoke with patient and advised Dr Aundra Dubin recommended to alternate Torsemide 60 mg every other day with 40 mg every other day.  Advised labs in 10 days and scheduled at [facility] street office 10/4 after appt with Dr Caryl Comes.  He asked Torsemide prescription be sent to preferred pharmacy [place] but will call when refill is needed.  He repeated instructions back correctly and agreed to plan.  Will reschedule ICM fluid level recheck for 10/2.

## 2022-06-12 NOTE — Progress Notes (Signed)
Remote ICD transmission.   

## 2022-06-19 ENCOUNTER — Ambulatory Visit (INDEPENDENT_AMBULATORY_CARE_PROVIDER_SITE_OTHER): Payer: Medicare HMO

## 2022-06-19 DIAGNOSIS — Z95810 Presence of automatic (implantable) cardiac defibrillator: Secondary | ICD-10-CM

## 2022-06-19 DIAGNOSIS — I5022 Chronic systolic (congestive) heart failure: Secondary | ICD-10-CM

## 2022-06-20 ENCOUNTER — Telehealth: Payer: Self-pay

## 2022-06-20 NOTE — Telephone Encounter (Signed)
Remote ICM transmission received.  Attempted call to patient regarding ICM remote transmission and left detailed message per DPR.  Advised to return call for any fluid symptoms or questions. Next ICM remote transmission scheduled 07/10/2022.    

## 2022-06-20 NOTE — Progress Notes (Signed)
EPIC Encounter for ICM Monitoring  Patient Name: [name] [name] is a 61 y.o. male Date: 06/20/2022 Primary Care Physican: Camillia Herter, NP Primary Cardiologist: Aundra Dubin Electrophysiologist: Caryl Comes BiV Pacing: 94.7% 10/19/2021 Weight: 321 lbs 01/31/2022 Weight: 312 lbs 05/02/2022 Weight: 305 lbs 06/09/2022 Weight: 305 lbs   Attempted call to patient and unable to reach.  Left detailed message per DPR regarding transmission. Transmission reviewed.    Optivol Thoracic impedance suggesting fluid levels improving since Torsemide dosage change and trending closer to baseline normal.  Fluid index has increased above normal threshold.     Prescribed:  Torsemide 20 mg 2 tablets (40 mg total) by mouth every other day alternating with 3 tablets (60 mg total) every other day.  Spironolactone 25 mg take 1 tablet daily.   Labs: 06/21/2022 BMET Scheduled 06/12/2022 Creatinine 1.24, BUN 15, Potassium 4.3, Sodium 137, GFR >60 05/25/2022 Creatinine 2.04, BUN 36, Potassium 4.5, Sodium 139, GFR 37 05/17/2022 Creatinine 2.63, BUN 53, Potassium 4.4, Sodium 137, GFR 27 02/07/2022 Creatinine 1.32, BUN 22, Potassium 4.1, Sodium 139, GFr 62 12/01/2021 Creatinine 1.28, BUN 15, Potassium 4.1, Sodium 140, GFR >60 A complete set of results can be found in Results Review.   Recommendations:   Left voice mail with ICM number and encouraged to call if experiencing any fluid symptoms.   Any further recommendations will be given at 10/4 OV with Dr Caryl Comes.   Follow-up plan: ICM clinic phone appointment on 07/10/2022.   91 day device clinic remote transmission 08/23/2022.     EP/Cardiology Office Visits:  08/22/2022 with HF clinic.    06/21/2022 with Dr Caryl Comes.     Copy of ICM check sent to Dr. Caryl Comes.   3 month ICM trend: 06/19/2022.    12-14 Month ICM trend:     Rosalene Billings, RN 06/20/2022 1:12 PM

## 2022-06-21 ENCOUNTER — Encounter: Payer: Self-pay | Admitting: Internal Medicine

## 2022-06-21 ENCOUNTER — Ambulatory Visit: Payer: Medicare HMO | Attending: Internal Medicine | Admitting: Internal Medicine

## 2022-06-21 ENCOUNTER — Ambulatory Visit: Payer: Medicare HMO

## 2022-06-21 ENCOUNTER — Other Ambulatory Visit (HOSPITAL_COMMUNITY): Payer: Medicare HMO

## 2022-06-21 VITALS — BP 104/60 | HR 59 | Ht 76.0 in | Wt 315.4 lb

## 2022-06-21 DIAGNOSIS — Z95810 Presence of automatic (implantable) cardiac defibrillator: Secondary | ICD-10-CM

## 2022-06-21 DIAGNOSIS — I4891 Unspecified atrial fibrillation: Secondary | ICD-10-CM

## 2022-06-21 DIAGNOSIS — I493 Ventricular premature depolarization: Secondary | ICD-10-CM | POA: Diagnosis not present

## 2022-06-21 DIAGNOSIS — I495 Sick sinus syndrome: Secondary | ICD-10-CM

## 2022-06-21 DIAGNOSIS — I428 Other cardiomyopathies: Secondary | ICD-10-CM

## 2022-06-21 DIAGNOSIS — I472 Ventricular tachycardia, unspecified: Secondary | ICD-10-CM

## 2022-06-21 DIAGNOSIS — I5022 Chronic systolic (congestive) heart failure: Secondary | ICD-10-CM

## 2022-06-21 LAB — BASIC METABOLIC PANEL
BUN/Creatinine Ratio: 18 (ref 9–20)
BUN: 30 mg/dL — ABNORMAL HIGH (ref 6–24)
CO2: 28 mmol/L (ref 20–29)
Calcium: 9.3 mg/dL (ref 8.7–10.2)
Chloride: 101 mmol/L (ref 96–106)
Creatinine, Ser: 1.64 mg/dL — ABNORMAL HIGH (ref 0.76–1.27)
Glucose: 97 mg/dL (ref 70–99)
Potassium: 4.9 mmol/L (ref 3.5–5.2)
Sodium: 140 mmol/L (ref 134–144)
eGFR: 48 mL/min/{1.73_m2} — ABNORMAL LOW (ref >59)

## 2022-06-21 NOTE — Patient Instructions (Signed)
Medication Instructions:  Your physician recommends that you continue on your current medications as directed. Please refer to the Current Medication list given to you today.   *If you need a refill on your cardiac medications before your next appointment, please call your pharmacy*   Lab Work: None ordered.  If you have labs (blood work) drawn today and your tests are completely normal, you will receive your results only by: MyChart Message (if you have MyChart) OR A paper copy in the mail If you have any lab test that is abnormal or we need to change your treatment, we will call you to review the results.   Testing/Procedures: None ordered.    Follow-Up: At [facility], you and your health needs are our priority.  As part of our continuing mission to provide you with exceptional heart care, we have created designated Provider Care Teams.  These Care Teams include your primary Cardiologist (physician) and Advanced Practice Providers (APPs -  Physician Assistants and Nurse Practitioners) who all work together to provide you with the care you need, when you need it.  We recommend signing up for the patient portal called "MyChart".  Sign up information is provided on this After Visit Summary.  MyChart is used to connect with patients for Virtual Visits (Telemedicine).  Patients are able to view lab/test results, encounter notes, upcoming appointments, etc.  Non-urgent messages can be sent to your provider as well.   To learn more about what you can do with MyChart, go to [email or web address].    Your next appointment:   9 months with Dr [name]  Important Information About Sugar       

## 2022-06-21 NOTE — Progress Notes (Signed)
10 months 3 CRT-D      Patient Care Team: Camillia Herter, NP as PCP - General (Nurse Practitioner) Jolaine Artist, MD as PCP - Advanced Heart Failure (Cardiology) Deboraha Sprang, MD as PCP - Electrophysiology (Cardiology) Larey Dresser, MD as PCP - Cardiology (Cardiology) [name], MD (Family Medicine) Jorge Ny, LCSW as Social Worker (Licensed Clinical Social Worker)   HPI  [name] [name] is a 61 y.o. 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  When I last saw him 3/21 in hospital reprogrammed LV subtherapeutic with a QRS duration of 140 ms.  GEN change 6/23  History of atrial fibrillation and polymorphic ventricular tachycardia for which amiodarone therapy was initiated. Potassium levels were low at that time   Recurrent ventricular tachycardia 9/18 potassium levels were normal at that time.   With failed shocks and amiodarone was continued and sotalol was initiated.  Hospitalized 3/21 his device was reprogrammed.  Unfortunately there is a discordance between my notes and the finding, my notes reporting that LV lead was turned off and the device programming is that RV lead is subthreshold.  The patient denies chest pain,, nocturnal dyspne, orthopnea or peripheral edema.  There have been no palpitations, lightheadedness or syncope.  Complains of dyspnea on exertion..      Date Cr K Mg TSH LFTs PFTs  9/18  1.02 3.8 2.3        11/18     0.860 36    12/22 1.18 3.9 2.3 (5/22) 1.085 16     DATE TEST EF   12/21 Echo 20-25 % LV global hypokinesis and hypertrophy, G1 diastolic dysfunction  42/87 Echo 20-25%            Past Medical History:  Diagnosis Date   AICD (automatic cardioverter/defibrillator) present    Arthritis    Atrial fibrillation (HCC)    CAD (coronary artery disease)    Mild  nonobstructive (Cath 09)   Chronic systolic heart failure (HCC)    Nonischemic CM: echo 4/12 with EF 25-30%, grade 2 diast dysfxn, mild dilated aortic root 43 mm, trivial MR, mod LAE   COPD (chronic obstructive pulmonary disease) (HCC)    Dyspnea    with exertion    Ejection fraction < 50% 08/2019   20-25% noted on ECHO   H/O ETOH abuse    HTN (hypertension)    Hypersomnia    Nonischemic cardiomyopathy (HCC)    Obesity, morbid (HCC)    OSA on CPAP    Systolic and diastolic CHF, acute on chronic ([place]) 11/2016   Tobacco use disorder    Ventricular tachycardia ([place])     Past Surgical History:  Procedure Laterality Date   CARDIAC CATHETERIZATION N/A 04/08/2015   Procedure: Right/Left Heart Cath and Coronary Angiography;  Surgeon: Jettie Booze, MD;  Location: [facility];  Service: Cardiovascular;  Laterality: N/A;   EP IMPLANTABLE DEVICE N/A 04/14/2015   Procedure: BiV ICD Insertion CRT-D;  Surgeon: Deboraha Sprang, MD;  Location: [facility];  Service: Cardiovascular;  Laterality: N/A;   HYDROCELE EXCISION / REPAIR     ICD GENERATOR CHANGEOUT N/A 02/20/2022   Procedure: ICD GENERATOR CHANGEOUT;  Surgeon: Deboraha Sprang, MD;  Location: [facility];  Service: Cardiovascular;  Laterality: N/A;   ICD IMPLANT     TOTAL HIP ARTHROPLASTY  Right 11/04/2018   Procedure: TOTAL HIP ARTHROPLASTY ANTERIOR APPROACH;  Surgeon: Dorna Leitz, MD;  Location: [facility];  Service: Orthopedics;  Laterality: Right;   TOTAL KNEE ARTHROPLASTY Right 01/30/2020   Procedure: TOTAL KNEE ARTHROPLASTY;  Surgeon: Dorna Leitz, MD;  Location: [facility];  Service: Orthopedics;  Laterality: Right;   TOTAL KNEE ARTHROPLASTY Left 07/30/2020   Procedure: TOTAL KNEE ARTHROPLASTY;  Surgeon: Dorna Leitz, MD;  Location: [facility];  Service: Orthopedics;  Laterality: Left;   TUMOR EXCISION Left    TUMOR EXCISION Left 1982   Benign tumor removed from L leg   VASECTOMY      Current Outpatient Medications   Medication Sig Dispense Refill   albuterol (VENTOLIN HFA) 108 (90 Base) MCG/ACT inhaler Inhale 1 puff into the lungs every 6 (six) hours as needed for wheezing or shortness of breath. 18 g 3   amiodarone (PACERONE) 200 MG tablet TAKE 1 TABLET (200 MG TOTAL) BY MOUTH DAILY. 90 tablet 3   Ascorbic Acid (VITAMIN C) 100 MG tablet Take 100 mg by mouth daily.     atorvastatin (LIPITOR) 80 MG tablet TAKE 1 TABLET (80 MG TOTAL) BY MOUTH EVERY EVENING. 90 tablet 3   ibuprofen (ADVIL) 200 MG tablet Take 400 mg by mouth every 8 (eight) hours as needed for moderate pain.     sacubitril-valsartan (ENTRESTO) 49-51 MG Take 1 tablet by mouth 2 (two) times daily. 180 tablet 3   sotalol (BETAPACE) 80 MG tablet TAKE 1 TABLET (80 MG TOTAL) BY MOUTH EVERY 12 (TWELVE) HOURS. 180 tablet 3   spironolactone (ALDACTONE) 25 MG tablet TAKE 1 TABLET (25 MG TOTAL) BY MOUTH DAILY. 90 tablet 3   torsemide (DEMADEX) 20 MG tablet Take 2 tablets (40 mg total) every other day alternating with 3 tablets (60 mg total) every other day. 225 tablet 3   XARELTO 20 MG TABS tablet TAKE 1 TABLET DAILY WITH SUPPER. 90 tablet 3   No current facility-administered medications for this visit.    No Known Allergies    Review of Systems negative except from HPI and PMH  Physical Exam BP 104/60   Pulse (!) 59   Ht 6\' 4"  (1.93 m)   Wt (!) 315 lb 6.4 oz (143.1 kg)   SpO2 97%   BMI 38.39 kg/m  Well developed and well nourished in no acute distress HENT normal Neck supple with JVP-flat Clear Device pocket well healed; without hematoma or erythema.  There is no tethering  Regular rate and rhythm, no  murmur Abd-soft with active BS No Clubbing cyanosis  edema Skin-warm and dry A & Oriented  Grossly normal sensory and motor function   ECG last (See Below) intervals 24/15/42  ECG  # 1 sinus at 61 with P synchronous pacing Intervals 25/20/50  12/18 QRS Rs V1 and negative lead I QRSd approximately 200 ms  1/20 QRS upright lead I  and negative lead V1 QRSd 158 ms 12/20 QRS negative lead I and upright V1 QRSd 218 ms 12/21 QRS negative lead V1 upright lead I QRSd 142 ms 8/22 QRS negative lead V1 upright lead I QRSd 140 ms  Assessment and  Plan  Nonischemic cardiomyopathy  Right bundle branch block  Congestive heart failure-chronic-systolic  Ventricular tachycardia-polymorphic  Implantable defibrillator-CRT-[employer]  PVCs  Atrial fibrillation-paroxysmal  Amiodarone therapy for the above  Sinus node dysfunction with chronotropic incompetence  Carpal tunnel  Lengthy reprogramming sessions.  His underlying conduction is right bundle branch block with left axis deviation and intrinsic  QRS duration of approximately 160 ms  Multiple adjustments were made initially excluding LV pacing and trying to create fusion between his right bundle branch block and his right ventricular pacing lead which was accomplished with the loss of his R prime at a PR interval of 210 ms and with some further narrowing of the QRS by LV activation at delay of 80 ms.  This is also associated with using a different LV vector resulting in improved longevity by about 2 years  No interval atrial fibrillation No interval ventricular tachycardia     .

## 2022-06-22 ENCOUNTER — Other Ambulatory Visit (HOSPITAL_COMMUNITY): Payer: Medicare HMO

## 2022-07-10 ENCOUNTER — Ambulatory Visit (INDEPENDENT_AMBULATORY_CARE_PROVIDER_SITE_OTHER): Payer: Medicare HMO

## 2022-07-10 ENCOUNTER — Other Ambulatory Visit (HOSPITAL_COMMUNITY): Payer: Self-pay | Admitting: Cardiology

## 2022-07-10 DIAGNOSIS — Z95810 Presence of automatic (implantable) cardiac defibrillator: Secondary | ICD-10-CM

## 2022-07-10 DIAGNOSIS — I5022 Chronic systolic (congestive) heart failure: Secondary | ICD-10-CM

## 2022-07-11 NOTE — Progress Notes (Signed)
EPIC Encounter for ICM Monitoring  Patient Name: [name] [name] is a 61 y.o. male Date: 07/11/2022 Primary Care Physican: Camillia Herter, NP Primary Cardiologist: Aundra Dubin Electrophysiologist: Caryl Comes BiV Pacing: 95.7% 10/19/2021 Weight: 321 lbs 01/31/2022 Weight: 312 lbs 05/02/2022 Weight: 305 lbs 06/09/2022 Weight: 305 lbs  Since 21-Jun-2022 Monitored VT (133-176 bpm) 1 VT-NS (>4 beats, >176 bpm) 2   Spoke with patient and heart failure questions reviewed.  Transmission results reviewed.  Pt asymptomatic for fluid accumulation.  Reports feeling well at this time and voices no complaints.     Optivol Thoracic impedance suggesting possible fluid accumulation starting 9/2 and returned to baseline normal 10/17.     Prescribed:  Torsemide 20 mg 2 tablets (40 mg total) by mouth every other day alternating with 3 tablets (60 mg total) every other day.  Spironolactone 25 mg take 1 tablet daily.   Labs: 06/21/2022 Creatinine 1.64, BUN 30, Potassium 4.9, Sodium 140, GFR 48 06/12/2022 Creatinine 1.24, BUN 15, Potassium 4.3, Sodium 137, GFR >60 05/25/2022 Creatinine 2.04, BUN 36, Potassium 4.5, Sodium 139, GFR 37 05/17/2022 Creatinine 2.63, BUN 53, Potassium 4.4, Sodium 137, GFR 27 02/07/2022 Creatinine 1.32, BUN 22, Potassium 4.1, Sodium 139, GFr 62 12/01/2021 Creatinine 1.28, BUN 15, Potassium 4.1, Sodium 140, GFR >60 A complete set of results can be found in Results Review.   Recommendations:  No changes and encouraged to call if experiencing any fluid symptoms.   Follow-up plan: ICM clinic phone appointment on 08/14/2022.   91 day device clinic remote transmission 08/23/2022.     EP/Cardiology Office Visits:  08/22/2022 with HF clinic.  9 month f/u due  03/2023 with Dr Caryl Comes (no recall).     Copy of ICM check sent to Dr. Caryl Comes.   3 month ICM trend: 07/10/2022.    12-14 Month ICM trend:     Rosalene Billings, RN 07/11/2022 6:22 AM

## 2022-07-27 ENCOUNTER — Telehealth (HOSPITAL_COMMUNITY): Payer: Self-pay

## 2022-07-27 ENCOUNTER — Other Ambulatory Visit (HOSPITAL_COMMUNITY): Payer: Self-pay

## 2022-07-27 NOTE — Telephone Encounter (Addendum)
Advanced Heart Failure Patient Advocate Encounter  Received renewal notification for [employer]). This patient is eligible for an open grant that would cover the cost of medication.  Patient also asked about potential Xarelto assistance. I will follow up about both medications.  Burnell Blanks, CPhT Rx Patient Advocate Phone: [phone]

## 2022-07-28 NOTE — Telephone Encounter (Signed)
Advanced Heart Failure Patient Advocate Encounter  The patient was approved for a [employer] grant that will help cover the cost of Entresto.  Total amount awarded, $10,000.  Effective: 06/27/2022 - 06/27/2023.  BIN [id number] PCN [id number] Group [id number] ID [id number]  Patient provided with approval and processing information via email. Current insurance coverage would require patient to spend 4% OOP in the 2024 year before Xarelto assistance would be approved, unable to apply for assistance at this time.  Burnell Blanks, CPhT Rx Patient Advocate Phone: [phone]

## 2022-08-14 ENCOUNTER — Ambulatory Visit (INDEPENDENT_AMBULATORY_CARE_PROVIDER_SITE_OTHER): Payer: Medicare HMO

## 2022-08-14 DIAGNOSIS — I5022 Chronic systolic (congestive) heart failure: Secondary | ICD-10-CM | POA: Diagnosis not present

## 2022-08-14 DIAGNOSIS — Z95810 Presence of automatic (implantable) cardiac defibrillator: Secondary | ICD-10-CM

## 2022-08-18 NOTE — Progress Notes (Signed)
EPIC Encounter for ICM Monitoring  Patient Name: [name] [name] is a 61 y.o. male Date: 08/18/2022 Primary Care Physican: [name], NP Primary Cardiologist: Shirlee Latch Electrophysiologist: Graciela Husbands BiV Pacing: 95.5% 10/19/2021 Weight: 321 lbs 01/31/2022 Weight: 312 lbs 05/02/2022 Weight: 305 lbs 06/09/2022 Weight: 305 lbs   Since 11-Jul-2022 Monitored VT (133-176 bpm)        1 VT-NS (>4 beats, >176 bpm)    1   Spoke with patient and heart failure questions reviewed.  Transmission results reviewed.  Pt asymptomatic for fluid accumulation.  Reports feeling well at this time and voices no complaints.     Optivol Thoracic impedance suggesting normal fluid levels.     Prescribed:  Torsemide 20 mg 2 tablets (40 mg total) by mouth every other day alternating with 3 tablets (60 mg total) every other day.  Spironolactone 25 mg take 1 tablet daily.   Labs: 06/21/2022 Creatinine 1.64, BUN 30, Potassium 4.9, Sodium 140, GFR 48 06/12/2022 Creatinine 1.24, BUN 15, Potassium 4.3, Sodium 137, GFR >60 05/25/2022 Creatinine 2.04, BUN 36, Potassium 4.5, Sodium 139, GFR 37 05/17/2022 Creatinine 2.63, BUN 53, Potassium 4.4, Sodium 137, GFR 27 02/07/2022 Creatinine 1.32, BUN 22, Potassium 4.1, Sodium 139, GFr 62 12/01/2021 Creatinine 1.28, BUN 15, Potassium 4.1, Sodium 140, GFR >60 A complete set of results can be found in Results Review.   Recommendations:  No changes and encouraged to call if experiencing any fluid symptoms.   Follow-up plan: ICM clinic phone appointment on 09/25/2022.   91 day device clinic remote transmission 08/23/2022.     EP/Cardiology Office Visits:  08/22/2022 with HF clinic.  9 month f/u due  03/2023 with Dr Graciela Husbands (no recall).     Copy of ICM check sent to Dr. Graciela Husbands.    3 month ICM trend: 08/14/2022.    12-14 Month ICM trend:     [name], RN 08/18/2022 9:21 AM

## 2022-08-21 NOTE — Progress Notes (Signed)
Advanced Heart Failure Clinic Note   Primary Care: Dr. Windle Guard, MD HF Cardiology: Dr. Shirlee Latch  HPI: [name] is a 61 y.o. male with a past medical history of chronic systolic CHF  (EF 15% in April 2018), dilated NICM s/p BiV ICD, PAF with intermittent compliance with Xarelto, ETOH abuse, tobacco abuse, OSA on CPAP, and HTN.   Admitted 12/25/16-12/27/16 with acute on chronic systolic CHF, volume overload. He was diuresed 11 pounds on IV lasix.  Discharge weight was 294 pounds. It was felt that the etiology of his NICM was ETOH vs hypertension related.   Admitted 5/14-5/16/18 with volume overload, acute respiratory failure requiring Bipap. He was not taking his Entresto as he was confused about dosing and there was a mix up his pharmacy. He was diuresed with IV lasix, discharge weight was 295 pounds.   Admitted 05/30/17-06/02/17 with VT, ICD shock x 4. He is now on amiodarone and sotalol.  Echo in 12/20 showed EF 20-25%, severe LV dilation, moderate LVH, normal RV, small IVC.   He was admitted in 3/21 with ICD shock in setting of hypokalemia and heavy ETOH. Entresto was decreased with low BP.  He was diuresed due to volume overload.   He had left TKR in 11/21 with no complication.   Echo in 12/21 showed EF 20-25%, moderate LVH, normal RV, normal IVC.   Echo in 12/22 showed EF 20-25%, diffuse hypokinesis, mildly decreased RV systolic function.   Patient returns for followup of CHF.  He is short of breath walking up hills.  No problems walking on flat ground.  He is able to do all his ADLs.  No chest pain.  No orthopnea/PND.  Weight down 6 lbs. He still drinks 2-3 ETOH drinks several days/week. No lightheadedness.     ECG (personally reviewed): NSR, BiV paced with QTc 550 msec  Medtronic device interrogation: No AF, no VT, only 77% effective BiV pacing.  Stable thoracic impedance.   Labs (9/18): K 3.8, creatinine 1.02 Labs (11/18): K 4.4, creatinine 1.19, TSH normal, LFTs normal Labs  (11/05/18): K 4 Creatinine 1.05  Labs (12/20): K 4, creatinine 1.19 Labs (3/21): K 4.1, creatinine 1.05, hgb 10.8, LFTs normal, TSH normal Labs (11/21): K 4.7, creatinine 1.24, hgb 11.9 Labs (12/21): K 4, creatinine 1.05, TSH and LFTs normal Labs (5/22): K 4.8, creatinine 1.67 Labs (5/23): K 4.1, creatinine 1.32  PMH: 1. Chronic systolic CHF: Medtronic CRT-D.  Nonischemic cardiomyopathy. Possibly ETOH-related.  - LHC (2016): No CAD.  - Echo (2016): EF 20-25%, diffuse hypokinesis.  - Echo (4/18): EF 15%, normal RV size and systolic function.  - Echo (11/18): EF 20%, mildly dilated LV with mild LVH, normal RV size with mildly decreased RV systolic function.  - Echo (12/20): EF 20-25%, severe LV dilation, moderate LVH, normal RV, small IVC.  - Echo (12/21): EF 20-25%, moderate LVH, normal RV, normal IVC. - Echo (12/22): EF 20-25%, diffuse hypokinesis, mildly decreased RV systolic function.  - PYP scan (4/23): grade 1, H/CL < 1.5 2. Atrial fibrillation: Paroxysmal.   3. OSA: On CPAP.  4. ETOH abuse: Prior heavy ETOH.  5. H/o VT 6. COPD: Prior smoker.  7. COVID-19 infection 7/22  ROS: All systems reviewed and negative except as per HPI.   Current Outpatient Medications  Medication Sig Dispense Refill   albuterol (VENTOLIN HFA) 108 (90 Base) MCG/ACT inhaler Inhale 1 puff into the lungs every 6 (six) hours as needed for wheezing or shortness of breath. 18 g 3  amiodarone (PACERONE) 200 MG tablet TAKE 1 TABLET (200 MG TOTAL) BY MOUTH DAILY. 90 tablet 3   Ascorbic Acid (VITAMIN C) 100 MG tablet Take 100 mg by mouth daily.     atorvastatin (LIPITOR) 80 MG tablet TAKE 1 TABLET EVERY EVENING 90 tablet 1   ibuprofen (ADVIL) 200 MG tablet Take 400 mg by mouth every 8 (eight) hours as needed for moderate pain.     sacubitril-valsartan (ENTRESTO) 49-51 MG Take 1 tablet by mouth 2 (two) times daily. 180 tablet 3   sotalol (BETAPACE) 80 MG tablet TAKE 1 TABLET EVERY 12 HOURS 180 tablet 1    spironolactone (ALDACTONE) 25 MG tablet TAKE 1 TABLET EVERY DAY 90 tablet 1   torsemide (DEMADEX) 20 MG tablet TAKE 3 TABLETS (60 MG TOTAL) EVERY MORNING AND 2 TABLETS (40 MG TOTAL) EVERY EVENING. 450 tablet 1   XARELTO 20 MG TABS tablet TAKE 1 TABLET DAILY WITH SUPPER. 90 tablet 3   No current facility-administered medications for this visit.    No Known Allergies    Social History   Socioeconomic History   Marital status: Single    Spouse name: Not on file   Number of children: 2   Years of education: Not on file   Highest education level: Not on file  Occupational History   Occupation: scrap metal   Occupation: disabled, odd jobs  Tobacco Use   Smoking status: Former    Packs/day: 0.50    Years: 4.00    Total pack years: 2.00    Types: Cigarettes    Quit date: 10/29/2005    Years since quitting: 16.8   Smokeless tobacco: Never   Tobacco comments:    about a pack a week. social  Vaping Use   Vaping Use: Never used  Substance and Sexual Activity   Alcohol use: Yes    Comment: 12 beers per week    Drug use: Not Currently    Types: Marijuana   Sexual activity: Yes    Partners: Female  Other Topics Concern   Not on file  Social History Narrative   ** Merged History Encounter **       Social Determinants of Health   Financial Resource Strain: Low Risk  (05/15/2021)   Overall Financial Resource Strain (CARDIA)    Difficulty of Paying Living Expenses: Not hard at all  Food Insecurity: No Food Insecurity (05/15/2021)   Hunger Vital Sign    Worried About Running Out of Food in the Last Year: Never true    Ran Out of Food in the Last Year: Never true  Transportation Needs: No Transportation Needs (05/15/2021)   PRAPARE - Administrator, Civil ServiceTransportation    Lack of Transportation (Medical): No    Lack of Transportation (Non-Medical): No  Physical Activity: Insufficiently Active (05/15/2021)   Exercise Vital Sign    Days of Exercise per Week: 2 days    Minutes of Exercise per Session:  20 min  Stress: No Stress Concern Present (05/15/2021)   [employer] - Occupational Stress Questionnaire    Feeling of Stress : Not at all  Social Connections: Moderately Isolated (05/15/2021)   Social Connection and Isolation Panel [NHANES]    Frequency of Communication with Friends and Family: More than three times a week    Frequency of Social Gatherings with Friends and Family: More than three times a week    Attends Religious Services: 1 to 4 times per year    Active Member of Clubs or  Organizations: No    Attends Banker Meetings: Never    Marital Status: Divorced  Catering manager Violence: Not At Risk (05/15/2021)   Humiliation, Afraid, Rape, and Kick questionnaire    Fear of Current or Ex-Partner: No    Emotionally Abused: No    Physically Abused: No    Sexually Abused: No      Family History  Problem Relation Age of Onset   Other Mother        cardiac surgery. late 1990s   Heart Problems Mother        CABG AGE 81   Congestive Heart Failure Father    Healthy Father        AGE 71   Coronary artery disease Other    Healthy Brother        AGE 16   Healthy Sister        AGE 62   Healthy Sister        AGE 55   Healthy Son    Healthy Son    Healthy Daughter     There were no vitals filed for this visit.  Wt Readings from Last 3 Encounters:  06/21/22 (!) 143.1 kg (315 lb 6.4 oz)  05/17/22 (!) 139.8 kg (308 lb 3.2 oz)  02/20/22 (!) 141.5 kg (312 lb)     PHYSICAL EXAM: General: NAD Neck: No JVD, no thyromegaly or thyroid nodule.  Lungs: Clear to auscultation bilaterally with normal respiratory effort. CV: Nondisplaced PMI.  Heart regular S1/S2, no S3/S4, no murmur.  No peripheral edema.  No carotid bruit.  Normal pedal pulses.  Abdomen: Soft, nontender, no hepatosplenomegaly, no distention.  Skin: Intact without lesions or rashes.  Neurologic: Alert and oriented x 3.  Psych: Normal affect. Extremities: No clubbing or  cyanosis.  HEENT: Normal.   ASSESSMENT & PLAN: 1. Chronic systolic CHF: NICM, Echo 12/2016 EF 15%. Normal coronaries on cath in July 2016. Medtronic CRT-D device. ETOH vs HTN vs myocarditis as cause of cardiomyopathy. Echo 12/22 showed that EF remains 20-25%.  PYP scan not suggestive of TTR cardiac amyloidosis.  NYHA class II symptoms.  Weight down 6 lbs, not volume overloaded on exam or Optivol.  BP soft but no orthostatic symptoms.  Effective BiV pacing only 77% of the time.  - Continue torsemide 40 mg bid.  BMET today.    - Continue Entresto 97/103 twice a day.   - Continue spironolactone 25 mg daily.  - Intolerant to Coreg in the past with hypotension, he is on sotalol and amiodarone.  - He stopped Jardiance after 3 UTIs, I will not start back.  - Discussed cutting back on ETOH as it may play a role in his cardiomyopathy.  - He needs BiV pacing optimized if possible, only 77% effective pacing.  2. Paroxysmal atrial fibrillation: He is in NSR today.  - Continue amiodarone 200 mg daily.  Check LFTs/TSH today.  Should have regular eye exam.  - Continue Xarelto 20 mg daily.  CBC today.  3. ETOH abuse: Suspect this contributes to cardiomyopathy.  Still drinking moderately, I again encouraged him to cut back. .   4. Tobacco abuse: No longer smoking.   5. VT: Followed closely by EP, he is now on amiodarone and sotalol (QTc at 550 msec today is borderline acceptable with pacing).  6. OSA: Use CPAP.   Followup 4 mos with APP but will check with EP to see if BiV pacing can be optimized.   Jacklynn Ganong,  FNP 08/21/22

## 2022-08-22 ENCOUNTER — Other Ambulatory Visit (HOSPITAL_COMMUNITY): Payer: Self-pay

## 2022-08-22 ENCOUNTER — Encounter (HOSPITAL_COMMUNITY): Payer: Self-pay

## 2022-08-22 ENCOUNTER — Ambulatory Visit (HOSPITAL_COMMUNITY)
Admission: RE | Admit: 2022-08-22 | Discharge: 2022-08-22 | Disposition: A | Discharge status: Home / Self Care | Payer: Medicare HMO | Source: Ambulatory Visit | Attending: Family Medicine | Admitting: Family Medicine

## 2022-08-22 VITALS — BP 104/72 | HR 58 | Wt 312.8 lb

## 2022-08-22 DIAGNOSIS — Z7901 Long term (current) use of anticoagulants: Secondary | ICD-10-CM | POA: Insufficient documentation

## 2022-08-22 DIAGNOSIS — G4733 Obstructive sleep apnea (adult) (pediatric): Secondary | ICD-10-CM | POA: Insufficient documentation

## 2022-08-22 DIAGNOSIS — Z87891 Personal history of nicotine dependence: Secondary | ICD-10-CM | POA: Insufficient documentation

## 2022-08-22 DIAGNOSIS — I5022 Chronic systolic (congestive) heart failure: Secondary | ICD-10-CM | POA: Insufficient documentation

## 2022-08-22 DIAGNOSIS — F1010 Alcohol abuse, uncomplicated: Secondary | ICD-10-CM | POA: Insufficient documentation

## 2022-08-22 DIAGNOSIS — I110 Hypertensive heart disease with heart failure: Secondary | ICD-10-CM | POA: Insufficient documentation

## 2022-08-22 DIAGNOSIS — I472 Ventricular tachycardia, unspecified: Secondary | ICD-10-CM | POA: Diagnosis not present

## 2022-08-22 DIAGNOSIS — I428 Other cardiomyopathies: Secondary | ICD-10-CM | POA: Diagnosis not present

## 2022-08-22 DIAGNOSIS — Z79899 Other long term (current) drug therapy: Secondary | ICD-10-CM | POA: Diagnosis not present

## 2022-08-22 DIAGNOSIS — I480 Paroxysmal atrial fibrillation: Secondary | ICD-10-CM | POA: Diagnosis not present

## 2022-08-22 LAB — CBC
HCT: 34.1 % — ABNORMAL LOW (ref 39.0–52.0)
Hemoglobin: 11.2 g/dL — ABNORMAL LOW (ref 13.0–17.0)
MCH: 32.3 pg (ref 26.0–34.0)
MCHC: 32.8 g/dL (ref 30.0–36.0)
MCV: 98.3 fL (ref 80.0–100.0)
Platelets: 173 10*3/uL (ref 150–400)
RBC: 3.47 MIL/uL — ABNORMAL LOW (ref 4.22–5.81)
RDW: 12.9 % (ref 11.5–15.5)
WBC: 4.4 10*3/uL (ref 4.0–10.5)
nRBC: 0 % (ref 0.0–0.2)

## 2022-08-22 LAB — BASIC METABOLIC PANEL
Anion gap: 5 (ref 5–15)
BUN: 17 mg/dL (ref 6–20)
CO2: 29 mmol/L (ref 22–32)
Calcium: 9.2 mg/dL (ref 8.9–10.3)
Chloride: 106 mmol/L (ref 98–111)
Creatinine, Ser: 1.25 mg/dL — ABNORMAL HIGH (ref 0.61–1.24)
GFR, Estimated: >60 mL/min (ref >60)
Glucose, Bld: 86 mg/dL (ref 70–99)
Potassium: 4.1 mmol/L (ref 3.5–5.1)
Sodium: 140 mmol/L (ref 135–145)

## 2022-08-22 LAB — MAGNESIUM: Magnesium: 2.4 mg/dL (ref 1.7–2.4)

## 2022-08-22 MED ORDER — TORSEMIDE 20 MG PO TABS: ORAL_TABLET | ORAL | 6 refills | Status: DC

## 2022-08-22 NOTE — Progress Notes (Signed)
Medication Samples have been provided to the patient.  Drug name: xarelto       Strength: 20 mg        Qty: 28  LOT: [id number]  Exp.Date: 06/2023  Dosing instructions: ONE TAB DAILY   The patient has been instructed regarding the correct time, dose, and frequency of taking this medication, including desired effects and most common side effects.   Magda Bernheim M 11:11 AM 08/22/2022

## 2022-08-22 NOTE — Patient Instructions (Signed)
Great to see you today! Please continue taking Torsemide 60 mg alternating with 40 mg every other day. Labwork completed today --we will only call with abnormal values.  Please call in March to schedule an appt in April with Dr. Shirlee Latch   At the [facility], you and your health needs are our priority. We have a designated team specialized in the treatment of Heart Failure. This Care Team includes your primary Heart Failure Specialized Cardiologist (physician), Advanced Practice Providers (APPs- Physician Assistants and Nurse Practitioners), and Pharmacist who all work together to provide you with the care you need, when you need it.   You may see any of the following providers on your designated Care Team at your next follow up:  Dr. Arvilla Meres Dr. Marca Ancona Dr. Marcos Eke, NP Robbie Lis, [place] [facility] [place], [place] Brynda Peon, NP Karle Plumber, PharmD   Please be sure to bring in all your medications bottles to every appointment.   Need to Contact us:  If you have any questions or concerns before your next appointment please send [place] a message through [place] or call our office at [phone].    TO LEAVE A MESSAGE FOR THE NURSE SELECT OPTION 2, PLEASE LEAVE A MESSAGE INCLUDING: YOUR NAME DATE OF BIRTH CALL BACK NUMBER REASON FOR CALL**this is important as we prioritize the call backs  YOU WILL RECEIVE A CALL BACK THE SAME DAY AS LONG AS YOU CALL BEFORE 4:00 PM

## 2022-08-23 ENCOUNTER — Ambulatory Visit (INDEPENDENT_AMBULATORY_CARE_PROVIDER_SITE_OTHER): Payer: Medicare HMO

## 2022-08-23 DIAGNOSIS — I472 Ventricular tachycardia, unspecified: Secondary | ICD-10-CM | POA: Diagnosis not present

## 2022-08-23 LAB — CUP PACEART REMOTE DEVICE CHECK
Battery Remaining Longevity: 87 mo
Battery Voltage: 3.01 V
Brady Statistic AP VP Percent: 9.24 %
Brady Statistic AP VS Percent: 3.65 %
Brady Statistic AS VP Percent: 86.7 %
Brady Statistic AS VS Percent: 0.41 %
Brady Statistic RA Percent Paced: 12.83 %
Brady Statistic RV Percent Paced: 95.28 %
Date Time Interrogation Session: 20231206022723
HighPow Impedance: 64 Ohm
Implantable Lead Connection Status: 753985
Implantable Lead Connection Status: 753985
Implantable Lead Connection Status: 753985
Implantable Lead Implant Date: 20160727
Implantable Lead Implant Date: 20160727
Implantable Lead Implant Date: 20160727
Implantable Lead Location: 753858
Implantable Lead Location: 753859
Implantable Lead Location: 753860
Implantable Lead Model: 4598
Implantable Lead Model: 5076
Implantable Lead Model: 6935
Implantable Pulse Generator Implant Date: 20230605
Lead Channel Impedance Value: 175.622
Lead Channel Impedance Value: 180.5 Ohm
Lead Channel Impedance Value: 184.154
Lead Channel Impedance Value: 189.525
Lead Channel Impedance Value: 189.525
Lead Channel Impedance Value: 342 Ohm
Lead Channel Impedance Value: 342 Ohm
Lead Channel Impedance Value: 361 Ohm
Lead Channel Impedance Value: 361 Ohm
Lead Channel Impedance Value: 399 Ohm
Lead Channel Impedance Value: 399 Ohm
Lead Channel Impedance Value: 418 Ohm
Lead Channel Impedance Value: 456 Ohm
Lead Channel Impedance Value: 589 Ohm
Lead Channel Impedance Value: 608 Ohm
Lead Channel Impedance Value: 608 Ohm
Lead Channel Impedance Value: 646 Ohm
Lead Channel Impedance Value: 646 Ohm
Lead Channel Pacing Threshold Amplitude: 0.5 V
Lead Channel Pacing Threshold Amplitude: 0.75 V
Lead Channel Pacing Threshold Amplitude: 1 V
Lead Channel Pacing Threshold Pulse Width: 0.4 ms
Lead Channel Pacing Threshold Pulse Width: 0.4 ms
Lead Channel Pacing Threshold Pulse Width: 1 ms
Lead Channel Sensing Intrinsic Amplitude: 1.75 mV
Lead Channel Sensing Intrinsic Amplitude: 1.75 mV
Lead Channel Sensing Intrinsic Amplitude: 3.625 mV
Lead Channel Sensing Intrinsic Amplitude: 3.625 mV
Lead Channel Setting Pacing Amplitude: 1.5 V
Lead Channel Setting Pacing Amplitude: 1.5 V
Lead Channel Setting Pacing Amplitude: 2 V
Lead Channel Setting Pacing Pulse Width: 0.4 ms
Lead Channel Setting Pacing Pulse Width: 1 ms
Lead Channel Setting Sensing Sensitivity: 0.3 mV
Zone Setting Status: 755011

## 2022-09-04 ENCOUNTER — Telehealth: Payer: Self-pay

## 2022-09-04 NOTE — Telephone Encounter (Signed)
Following alert received from [employer] received for Device alert for sustained VT with successful HV therapy. Event occurred 12/15 @ 23:27, EGM shows sustained VT, duration 21sec HR 207, falling into the VF zone, ATP delivered x1 unsuccessfully followed by 35J HV therapy converting to AS-VP.  Amiodarone, Sotalol.  Patient reports he got up to go to the restroom, was standing and suddenly felt lightheaded and felt the shock. Denies any chest pain, shortness of breath or palpitations. Meds on file verified including doses and reports compliance. Patient reports he was surprised his ICD shocked him due to feeling well lately.   Had recent OV with Dr. Graciela Husbands 06/21/2022 and several changes made to ICD, please refer to note.   Advised patient of shock plan/no driving x6 months with start date of 09/01/2022 per [place] DMV. Patient voiced understanding.   Advised I will forward to Dr. Graciela Husbands for review and we will call if any changes warranted. Appreciative of call.

## 2022-09-06 NOTE — Telephone Encounter (Signed)
VT noted,  will make no changes at this time -- possible that reprogramming has increased dispersion of refractoriness which can be arrhythmogenic  Thanks SK

## 2022-09-07 NOTE — Telephone Encounter (Signed)
Notified patient.

## 2022-09-15 NOTE — Progress Notes (Signed)
Remote ICD transmission.   

## 2022-09-25 ENCOUNTER — Ambulatory Visit (INDEPENDENT_AMBULATORY_CARE_PROVIDER_SITE_OTHER): Payer: Medicare HMO

## 2022-09-25 DIAGNOSIS — I5022 Chronic systolic (congestive) heart failure: Secondary | ICD-10-CM

## 2022-09-25 DIAGNOSIS — Z95810 Presence of automatic (implantable) cardiac defibrillator: Secondary | ICD-10-CM | POA: Diagnosis not present

## 2022-09-28 NOTE — Progress Notes (Signed)
EPIC Encounter for ICM Monitoring  Patient Name: [name] [name] is a 62 y.o. male Date: 09/28/2022 Primary Care Physican: Camillia Herter, NP Primary Cardiologist: Aundra Dubin Electrophysiologist: Caryl Comes BiV Pacing: 95.3% 10/19/2021 Weight: 321 lbs 01/31/2022 Weight: 312 lbs 05/02/2022 Weight: 305 lbs 06/09/2022 Weight: 305 lbs   Since 02-Sep-2022 Monitored VT (133-176 bpm)        1 VT-NS (>4 beats, >176 bpm)    6   Spoke with patient and heart failure questions reviewed.  Transmission results reviewed.  Pt asymptomatic for fluid accumulation.  Reports feeling well at this time and voices no complaints.     Optivol Thoracic impedance suggesting normal fluid levels.     Prescribed:  Torsemide 20 mg 2 tablets (40 mg total) by mouth every other day alternating with 3 tablets (60 mg total) every other day.  Spironolactone 25 mg take 1 tablet daily.   Labs: 08/23/2023 Creatinine 1.25, BUN 17, Potassium 4.1, Sodium 140, GFR >60 06/21/2022 Creatinine 1.64, BUN 30, Potassium 4.9, Sodium 140, GFR 48 06/12/2022 Creatinine 1.24, BUN 15, Potassium 4.3, Sodium 137, GFR >60 05/25/2022 Creatinine 2.04, BUN 36, Potassium 4.5, Sodium 139, GFR 37 05/17/2022 Creatinine 2.63, BUN 53, Potassium 4.4, Sodium 137, GFR 27 02/07/2022 Creatinine 1.32, BUN 22, Potassium 4.1, Sodium 139, GFr 62 12/01/2021 Creatinine 1.28, BUN 15, Potassium 4.1, Sodium 140, GFR >60 A complete set of results can be found in Results Review.   Recommendations:  No changes and encouraged to call if experiencing any fluid symptoms.   Follow-up plan: ICM clinic phone appointment on 10/30/2022.   91 day device clinic remote transmission 11/22/2022.     EP/Cardiology Office Visits:  12/22/2022 with HF clinic.  9 month f/u due  10/30/2022 with Dr Caryl Comes.     Copy of ICM check sent to Dr. Caryl Comes.     3 month ICM trend: 09/25/2022.    12-14 Month ICM trend:     Rosalene Billings, RN 09/28/2022 3:34 PM

## 2022-10-26 DIAGNOSIS — I4510 Unspecified right bundle-branch block: Secondary | ICD-10-CM | POA: Insufficient documentation

## 2022-10-27 ENCOUNTER — Encounter: Payer: Self-pay | Admitting: Cardiology

## 2022-10-30 ENCOUNTER — Ambulatory Visit: Payer: Medicare HMO | Attending: Internal Medicine | Admitting: Internal Medicine

## 2022-10-30 ENCOUNTER — Ambulatory Visit: Payer: Medicare HMO | Attending: Internal Medicine

## 2022-10-30 ENCOUNTER — Encounter: Payer: Self-pay | Admitting: Internal Medicine

## 2022-10-30 VITALS — BP 102/64 | HR 61 | Ht 76.0 in | Wt 321.0 lb

## 2022-10-30 DIAGNOSIS — I5022 Chronic systolic (congestive) heart failure: Secondary | ICD-10-CM | POA: Diagnosis not present

## 2022-10-30 DIAGNOSIS — G5600 Carpal tunnel syndrome, unspecified upper limb: Secondary | ICD-10-CM

## 2022-10-30 DIAGNOSIS — I4891 Unspecified atrial fibrillation: Secondary | ICD-10-CM | POA: Diagnosis not present

## 2022-10-30 DIAGNOSIS — Z95810 Presence of automatic (implantable) cardiac defibrillator: Secondary | ICD-10-CM

## 2022-10-30 DIAGNOSIS — Z79899 Other long term (current) drug therapy: Secondary | ICD-10-CM

## 2022-10-30 DIAGNOSIS — I4729 Other ventricular tachycardia: Secondary | ICD-10-CM

## 2022-10-30 DIAGNOSIS — I4510 Unspecified right bundle-branch block: Secondary | ICD-10-CM | POA: Diagnosis not present

## 2022-10-30 DIAGNOSIS — J449 Chronic obstructive pulmonary disease, unspecified: Secondary | ICD-10-CM | POA: Diagnosis not present

## 2022-10-30 DIAGNOSIS — I428 Other cardiomyopathies: Secondary | ICD-10-CM

## 2022-10-30 DIAGNOSIS — I495 Sick sinus syndrome: Secondary | ICD-10-CM

## 2022-10-30 DIAGNOSIS — I493 Ventricular premature depolarization: Secondary | ICD-10-CM

## 2022-10-30 DIAGNOSIS — I472 Ventricular tachycardia, unspecified: Secondary | ICD-10-CM

## 2022-10-30 LAB — CUP PACEART INCLINIC DEVICE CHECK
Brady Statistic RA Percent Paced: 24.2 %
Date Time Interrogation Session: 20240212120821
HighPow Impedance: 61 Ohm
Implantable Lead Connection Status: 753985
Implantable Lead Connection Status: 753985
Implantable Lead Connection Status: 753985
Implantable Lead Implant Date: 20160727
Implantable Lead Implant Date: 20160727
Implantable Lead Implant Date: 20160727
Implantable Lead Location: 753858
Implantable Lead Location: 753859
Implantable Lead Location: 753860
Implantable Lead Model: 4598
Implantable Lead Model: 5076
Implantable Lead Model: 6935
Implantable Pulse Generator Implant Date: 20230605
Lead Channel Impedance Value: 399 Ohm
Lead Channel Impedance Value: 436 Ohm
Lead Channel Impedance Value: 456 Ohm
Lead Channel Pacing Threshold Amplitude: 0.5 V
Lead Channel Pacing Threshold Amplitude: 0.75 V
Lead Channel Pacing Threshold Amplitude: 1 V
Lead Channel Pacing Threshold Pulse Width: 0.4 ms
Lead Channel Pacing Threshold Pulse Width: 0.4 ms
Lead Channel Pacing Threshold Pulse Width: 1 ms
Lead Channel Sensing Intrinsic Amplitude: 4.3 mV
Lead Channel Sensing Intrinsic Amplitude: 5.3 mV

## 2022-10-30 LAB — HEPATIC FUNCTION PANEL
ALT: 24 IU/L (ref 0–44)
AST: 20 IU/L (ref 0–40)
Albumin: 4.5 g/dL (ref 3.8–4.9)
Alkaline Phosphatase: 82 IU/L (ref 44–121)
Bilirubin Total: 0.5 mg/dL (ref 0.0–1.2)
Bilirubin, Direct: 0.17 mg/dL (ref 0.00–0.40)
Total Protein: 6.5 g/dL (ref 6.0–8.5)

## 2022-10-30 LAB — TSH: TSH: 1.44 u[IU]/mL (ref 0.450–4.500)

## 2022-10-30 NOTE — Progress Notes (Signed)
10 months 3 CRT-D      Patient Care Team: Camillia Herter, NP as PCP - General (Nurse Practitioner) Jolaine Artist, MD as PCP - Advanced Heart Failure (Cardiology) Deboraha Sprang, MD as PCP - Electrophysiology (Cardiology) Larey Dresser, MD as PCP - Cardiology (Cardiology) [name], MD (Family Medicine) Jorge Ny, LCSW as Social Worker (Licensed Clinical Social Worker)   HPI  [name] [name] is a 62 y.o. 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  When I last saw him 3/21 in hospital reprogrammed LV subtherapeutic with a QRS duration of 140 ms.  GEN change 6/23  History of atrial fibrillation and polymorphic ventricular tachycardia for which amiodarone therapy was initiated. Potassium levels were low at that time   Recurrent ventricular tachycardia 9/18 potassium levels were normal at that time.   With failed shocks and amiodarone was continued and sotalol was initiated.  Hospitalized 3/21 his device was reprogrammed.  Unfortunately there is a discordance between my notes and the finding, my notes reporting that LV lead was turned off and the device programming is that RV lead is subthreshold.  At the last visit 10/23 reprogramming was undertaken to try to create fusion with improvement in QRSd from 200--150 ms.  Intercurrent ventricular tachycardia treated with ATP    Dyspnea is better since the reprogramming.  Also with some peripheral edema.  Feels like his carpal tunnel is worsening as he is dropping things with his right hand particularly.  Struggling with his CPAP.  Feels like he is waking at night with a sense of suffocation.   Date Cr K Mg TSH LFTs Hgb PFTs  9/18  1.02 3.8 2.3         11/18     0.860 36 11.0    12/22 1.18 3.9 2.3 (5/22) 1.085 16 11.8   12/23 1.25<<2.6 4.1    11.2     DATE  TEST EF   12/21 Echo 20-25 % LV global hypokinesis and hypertrophy, G1 diastolic dysfunction  [id number] Echo 20-25%   4/23 PYP  Equivocal       Past Medical History:  Diagnosis Date   AICD (automatic cardioverter/defibrillator) present    Arthritis    Atrial fibrillation (HCC)    CAD (coronary artery disease)    Mild nonobstructive (Cath 09)   Chronic systolic heart failure (HCC)    Nonischemic CM: echo 4/12 with EF 25-30%, grade 2 diast dysfxn, mild dilated aortic root 43 mm, trivial MR, mod LAE   COPD (chronic obstructive pulmonary disease) (HCC)    Dyspnea    with exertion    Ejection fraction < 50% 08/2019   20-25% noted on ECHO   H/O ETOH abuse    HTN (hypertension)    Hypersomnia    Nonischemic cardiomyopathy (HCC)    Obesity, morbid (HCC)    OSA on CPAP    Systolic and diastolic CHF, acute on chronic ([place]) 11/2016   Tobacco use disorder    Ventricular tachycardia ([place])     Past Surgical History:  Procedure Laterality Date   CARDIAC CATHETERIZATION N/A 04/08/2015   Procedure: Right/Left Heart Cath and Coronary Angiography;  Surgeon: Jettie Booze, MD;  Location: [facility];  Service: Cardiovascular;  Laterality: N/A;   EP IMPLANTABLE DEVICE N/A 04/14/2015   Procedure: BiV ICD Insertion CRT-D;  Surgeon: Deboraha Sprang,  MD;  Location: [facility];  Service: Cardiovascular;  Laterality: N/A;   HYDROCELE EXCISION / REPAIR     ICD GENERATOR CHANGEOUT N/A 02/20/2022   Procedure: ICD GENERATOR CHANGEOUT;  Surgeon: Deboraha Sprang, MD;  Location: [facility];  Service: Cardiovascular;  Laterality: N/A;   ICD IMPLANT     TOTAL HIP ARTHROPLASTY Right 11/04/2018   Procedure: TOTAL HIP ARTHROPLASTY ANTERIOR APPROACH;  Surgeon: Dorna Leitz, MD;  Location: [facility];  Service: Orthopedics;  Laterality: Right;   TOTAL KNEE ARTHROPLASTY Right 01/30/2020   Procedure: TOTAL KNEE ARTHROPLASTY;  Surgeon: Dorna Leitz, MD;  Location: [facility];  Service: Orthopedics;   Laterality: Right;   TOTAL KNEE ARTHROPLASTY Left 07/30/2020   Procedure: TOTAL KNEE ARTHROPLASTY;  Surgeon: Dorna Leitz, MD;  Location: [facility];  Service: Orthopedics;  Laterality: Left;   TUMOR EXCISION Left    TUMOR EXCISION Left 1982   Benign 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 18 g 3   amiodarone (PACERONE) 200 MG tablet TAKE 1 TABLET (200 MG TOTAL) BY MOUTH DAILY. 90 tablet 3   Ascorbic Acid (VITAMIN C) 100 MG tablet Take 100 mg by mouth daily.     atorvastatin (LIPITOR) 80 MG tablet TAKE 1 TABLET EVERY EVENING 90 tablet 1   ibuprofen (ADVIL) 200 MG tablet Take 400 mg by mouth every 8 (eight) hours as needed for moderate pain.     sacubitril-valsartan (ENTRESTO) 49-51 MG Take 1 tablet by mouth 2 (two) times daily. 180 tablet 3   sotalol (BETAPACE) 80 MG tablet TAKE 1 TABLET EVERY 12 HOURS 180 tablet 1   spironolactone (ALDACTONE) 25 MG tablet TAKE 1 TABLET EVERY DAY 90 tablet 1   torsemide (DEMADEX) 20 MG tablet Take 3 tablets (60 mg total) by mouth every other day AND 2 tablets (40 mg total) every other day. Take 60 mg alternating every other day with 40 mg. 250 tablet 6   XARELTO 20 MG TABS tablet TAKE 1 TABLET DAILY WITH SUPPER. 90 tablet 3   No current facility-administered medications for this visit.    No Known Allergies    Review of Systems negative except from HPI and PMH  Physical Exam BP 102/64   Pulse 61   Ht 6' 4"$  (1.93 m)   Wt (!) 321 lb (145.6 kg)   SpO2 99%   BMI 39.07 kg/m  Well developed and well nourished in no acute distress HENT normal Neck supple with JVP-flat Clear Device pocket well healed; without hematoma or erythema.  There is no tethering  Regular rate and rhythm, no  gallop No  murmur Abd-soft with active BS No Clubbing  cyanosis 2+ edema Skin-warm and dry A & Oriented  Grossly normal sensory and motor function  ECG sinus  P-synchronous/ AV  pacing 25/17/49  Device function is normal. Programming changes none I think I given on a piece of paper is not very increasing oh CUBII change in the continues to get him thank you much See Paceart for details     ECG last (See Below) intervals 24/15/42  ECG  # 1 sinus at 59 with P synchronous pacing Intervals 25/20/50  12/18 QRS Rs V1 and negative lead I QRSd approximately 200 ms  1/20  QRS upright lead I and negative lead V1 QRSd 158 ms 12/20 QRS negative lead I and upright V1 QRSd 218 ms 12/21 QRS negative lead V1 upright lead I QRSd 142 ms 8/22 QRS negative lead V1 upright lead I QRSd 140 ms  Assessment and  Plan  Nonischemic cardiomyopathy  Right bundle branch block  Congestive heart failure-chronic-systolic  Ventricular tachycardia-polymorphic  Implantable defibrillator-CRT-Medtronic  PVCs  Atrial fibrillation-paroxysmal  Amiodarone therapy for the above  Sinus node dysfunction with chronotropic incompetence  Carpal tunnel  No interval atrial fibrillation.  Interval ventricular tachycardia requiring treatment.  Also an increase in the burden of his nonsustained ventricular tachycardia.  However, given the fact that he has been feeling considerably better with exercise tolerance and this not withstanding the peripheral edema, we will leave his device as it is and plan to review this again in 3 months.  With his edema, we will increase his Demadex from 40 alternating with 60 to 60 mg daily x 5 days.  I then encouraged him that if he has recurrent edema to increase his Demadex for a couple of days until it resolves.  Carpal tunnel is worsening.  Will refer to Dr. Amedeo Plenty  He is feeling like he is suffocating during his sleep therapy is more recently.  He is on BiPAP, care has been assumed by Dr. Radford Pax so we will refer him back to Dr.  Radford Pax for assistance.     Marland Kitchen

## 2022-10-30 NOTE — Patient Instructions (Addendum)
Medication Instructions:  Your physician recommends that you continue on your current medications as directed. Please refer to the Current Medication list given to you today.  *If you need a refill on your cardiac medications before your next appointment, please call your pharmacy*   Lab Work: TSH and Liver Panel today  If you have labs (blood work) drawn today and your tests are completely normal, you will receive your results only by: MyChart Message (if you have MyChart) OR A paper copy in the mail If you have any lab test that is abnormal or we need to change your treatment, we will call you to review the results.   Testing/Procedures: None ordered.    Follow-Up: At [facility], you and your health needs are our priority.  As part of our continuing mission to provide you with exceptional heart care, we have created designated Provider Care Teams.  These Care Teams include your primary Cardiologist (physician) and Advanced Practice Providers (APPs -  Physician Assistants and Nurse Practitioners) who all work together to provide you with the care you need, when you need it.  We recommend signing up for the patient portal called "MyChart".  Sign up information is provided on this After Visit Summary.  MyChart is used to connect with patients for Virtual Visits (Telemedicine).  Patients are able to view lab/test results, encounter notes, upcoming appointments, etc.  Non-urgent messages can be sent to your provider as well.   To learn more about what you can do with MyChart, go to [email or web address].    Your next appointment:   6 months with Dr Caryl Comes  Other Instructions Follow up with Dr Radford Pax

## 2022-11-06 NOTE — Progress Notes (Signed)
EPIC Encounter for ICM Monitoring  Patient Name: [name] [name] is a 62 y.o. male Date: 11/06/2022 Primary Care Physican: Camillia Herter, NP Primary Cardiologist: Aundra Dubin Electrophysiologist: Caryl Comes BiV Pacing: 95.5% 10/19/2021 Weight: 321 lbs 01/31/2022 Weight: 312 lbs 05/02/2022 Weight: 305 lbs 06/09/2022 Weight: 305 lbs   Since 25-Sep-2022 Time in AT/AF 0.0 hr/day (0.0%)    Transmission reviewed.    Optivol Thoracic impedance suggesting normal fluid levels.     Prescribed:  Torsemide 20 mg 2 tablets (40 mg total) by mouth every other day alternating with 3 tablets (60 mg total) every other day.  Spironolactone 25 mg take 1 tablet daily.   Labs: 08/23/2023 Creatinine 1.25, BUN 17, Potassium 4.1, Sodium 140, GFR >60 06/21/2022 Creatinine 1.64, BUN 30, Potassium 4.9, Sodium 140, GFR 48 06/12/2022 Creatinine 1.24, BUN 15, Potassium 4.3, Sodium 137, GFR >60 05/25/2022 Creatinine 2.04, BUN 36, Potassium 4.5, Sodium 139, GFR 37 05/17/2022 Creatinine 2.63, BUN 53, Potassium 4.4, Sodium 137, GFR 27 02/07/2022 Creatinine 1.32, BUN 22, Potassium 4.1, Sodium 139, GFr 62 12/01/2021 Creatinine 1.28, BUN 15, Potassium 4.1, Sodium 140, GFR >60 A complete set of results can be found in Results Review.   Recommendations:  No changes.     Follow-up plan: ICM clinic phone appointment on 12/04/2022.   91 day device clinic remote transmission 11/22/2022.     EP/Cardiology Office Visits:   9 month f/u due  04/20/2023 with Dr Caryl Comes.     Copy of ICM check sent to Dr. Caryl Comes.   3 month ICM trend: 10/30/2022.    12-14 Month ICM trend:     Rosalene Billings, RN 11/06/2022 3:01 PM

## 2022-11-08 DIAGNOSIS — G5603 Carpal tunnel syndrome, bilateral upper limbs: Secondary | ICD-10-CM | POA: Diagnosis not present

## 2022-11-21 ENCOUNTER — Other Ambulatory Visit (HOSPITAL_COMMUNITY): Payer: Self-pay | Admitting: Cardiology

## 2022-11-22 ENCOUNTER — Ambulatory Visit: Payer: Medicare HMO

## 2022-11-22 DIAGNOSIS — G5603 Carpal tunnel syndrome, bilateral upper limbs: Secondary | ICD-10-CM | POA: Diagnosis not present

## 2022-11-22 DIAGNOSIS — I428 Other cardiomyopathies: Secondary | ICD-10-CM | POA: Diagnosis not present

## 2022-11-22 LAB — CUP PACEART REMOTE DEVICE CHECK
Battery Remaining Longevity: 84 mo
Battery Voltage: 3 V
Brady Statistic AP VP Percent: 17.47 %
Brady Statistic AP VS Percent: 3.02 %
Brady Statistic AS VP Percent: 78.67 %
Brady Statistic AS VS Percent: 0.83 %
Brady Statistic RA Percent Paced: 20.26 %
Brady Statistic RV Percent Paced: 94.76 %
Date Time Interrogation Session: 20240306022722
HighPow Impedance: 66 Ohm
Implantable Lead Connection Status: 753985
Implantable Lead Connection Status: 753985
Implantable Lead Connection Status: 753985
Implantable Lead Implant Date: 20160727
Implantable Lead Implant Date: 20160727
Implantable Lead Implant Date: 20160727
Implantable Lead Location: 753858
Implantable Lead Location: 753859
Implantable Lead Location: 753860
Implantable Lead Model: 4598
Implantable Lead Model: 5076
Implantable Lead Model: 6935
Implantable Pulse Generator Implant Date: 20230605
Lead Channel Impedance Value: 180.5 Ohm
Lead Channel Impedance Value: 189.525
Lead Channel Impedance Value: 189.525
Lead Channel Impedance Value: 189.525
Lead Channel Impedance Value: 199.5 Ohm
Lead Channel Impedance Value: 361 Ohm
Lead Channel Impedance Value: 361 Ohm
Lead Channel Impedance Value: 361 Ohm
Lead Channel Impedance Value: 399 Ohm
Lead Channel Impedance Value: 399 Ohm
Lead Channel Impedance Value: 399 Ohm
Lead Channel Impedance Value: 418 Ohm
Lead Channel Impedance Value: 456 Ohm
Lead Channel Impedance Value: 608 Ohm
Lead Channel Impedance Value: 608 Ohm
Lead Channel Impedance Value: 646 Ohm
Lead Channel Impedance Value: 646 Ohm
Lead Channel Impedance Value: 665 Ohm
Lead Channel Pacing Threshold Amplitude: 0.5 V
Lead Channel Pacing Threshold Amplitude: 0.75 V
Lead Channel Pacing Threshold Amplitude: 1 V
Lead Channel Pacing Threshold Pulse Width: 0.4 ms
Lead Channel Pacing Threshold Pulse Width: 0.4 ms
Lead Channel Pacing Threshold Pulse Width: 1 ms
Lead Channel Sensing Intrinsic Amplitude: 1.625 mV
Lead Channel Sensing Intrinsic Amplitude: 1.625 mV
Lead Channel Sensing Intrinsic Amplitude: 3.5 mV
Lead Channel Sensing Intrinsic Amplitude: 3.5 mV
Lead Channel Setting Pacing Amplitude: 1.5 V
Lead Channel Setting Pacing Amplitude: 1.5 V
Lead Channel Setting Pacing Amplitude: 2 V
Lead Channel Setting Pacing Pulse Width: 0.4 ms
Lead Channel Setting Pacing Pulse Width: 1 ms
Lead Channel Setting Sensing Sensitivity: 0.3 mV
Zone Setting Status: 755011

## 2022-11-29 DIAGNOSIS — G5603 Carpal tunnel syndrome, bilateral upper limbs: Secondary | ICD-10-CM | POA: Diagnosis not present

## 2022-12-04 ENCOUNTER — Ambulatory Visit: Payer: Medicare HMO | Attending: Internal Medicine

## 2022-12-04 DIAGNOSIS — Z95810 Presence of automatic (implantable) cardiac defibrillator: Secondary | ICD-10-CM

## 2022-12-04 DIAGNOSIS — I5022 Chronic systolic (congestive) heart failure: Secondary | ICD-10-CM | POA: Diagnosis not present

## 2022-12-06 NOTE — Progress Notes (Signed)
EPIC Encounter for ICM Monitoring  Patient Name: [name] [name] is a 62 y.o. male Date: 12/06/2022 Primary Care Physican: No primary care provider on file. Primary Cardiologist: Aundra Dubin Electrophysiologist: Caryl Comes BiV Pacing: 94.4% 10/30/2022 Office Weight: 321 lbs   Since 22-Nov-2022 Time in AT/AF  0.0 hr/day (0.0%)     Spoke with patient and heart failure questions reviewed.  Transmission results reviewed.  Pt asymptomatic for fluid accumulation.  Reports feeling well at this time and voices no complaints.     Optivol Thoracic impedance suggesting normal fluid levels.     Prescribed:  Torsemide 20 mg 2 tablets (40 mg total) by mouth every other day alternating with 3 tablets (60 mg total) every other day.  Spironolactone 25 mg take 1 tablet daily.   Labs: 08/23/2023 Creatinine 1.25, BUN 17, Potassium 4.1, Sodium 140, GFR >60 06/21/2022 Creatinine 1.64, BUN 30, Potassium 4.9, Sodium 140, GFR 48 06/12/2022 Creatinine 1.24, BUN 15, Potassium 4.3, Sodium 137, GFR >60 05/25/2022 Creatinine 2.04, BUN 36, Potassium 4.5, Sodium 139, GFR 37 05/17/2022 Creatinine 2.63, BUN 53, Potassium 4.4, Sodium 137, GFR 27 02/07/2022 Creatinine 1.32, BUN 22, Potassium 4.1, Sodium 139, GFr 62 12/01/2021 Creatinine 1.28, BUN 15, Potassium 4.1, Sodium 140, GFR >60 A complete set of results can be found in Results Review.   Recommendations: No changes and encouraged to call if experiencing any fluid symptoms.     Follow-up plan: ICM clinic phone appointment on 01/08/2023.   91 day device clinic remote transmission 02/21/2023.     EP/Cardiology Office Visits:   04/20/2023 with Dr Caryl Comes.     Copy of ICM check sent to Dr. Caryl Comes.   3 month ICM trend: 12/04/2022.    12-14 Month ICM trend:     Rosalene Billings, RN 12/06/2022 2:51 PM

## 2022-12-07 DIAGNOSIS — G5601 Carpal tunnel syndrome, right upper limb: Secondary | ICD-10-CM | POA: Diagnosis not present

## 2022-12-12 ENCOUNTER — Telehealth: Payer: Self-pay | Admitting: Internal Medicine

## 2022-12-12 NOTE — Telephone Encounter (Signed)
Patient is going to fax over paperwork for patient assistance on his Entresto. Patient is requesting a callback once the paperwork has been received. Please advise

## 2022-12-12 NOTE — Telephone Encounter (Signed)
**Note De-Identified Jaykwon Morones Obfuscation** I did receive the pts NPA[employer] application for [facility] assistance. I have completed the providers page of his application and have e-mailed it to the nurse working with our DOD, Dr Curt Bears so she can obtain his signature in Dr Olin Pia absence, date it, and to fax to [employer] at the fax number written on the cover letter included.

## 2022-12-13 NOTE — Telephone Encounter (Signed)
Faxed, confirmation received 

## 2022-12-18 NOTE — Telephone Encounter (Signed)
**Note De-Identified Kasai Beltran Obfuscation** Letter received from [employer] stating that they need the pts proof of income. The letter states that they have reached out to the pt as well. Pt ID: [id number]

## 2022-12-20 ENCOUNTER — Ambulatory Visit: Payer: Medicare HMO | Admitting: Cardiology

## 2022-12-20 ENCOUNTER — Encounter: Payer: Self-pay | Admitting: Cardiology

## 2022-12-20 NOTE — Progress Notes (Unsigned)
SLEEP MEDICINE VIRTUAL CONSULT NOTE via Video Note   Because of [name] [name]'s co-morbid illnesses, he is at least at moderate risk for complications without adequate follow up.  This format is felt to be most appropriate for this patient at this time.  All issues noted in this document were discussed and addressed.  A limited physical exam was performed with this format.  Please refer to the patient's chart for his consent to telehealth for [facility].      Date:  12/20/2022   ID:  [name] [name], DOB 02-Mar-1961, MRN [id number] The patient was identified using 2 identifiers.  Patient Location: Home Provider Location: Home Office   PCP:  No primary care provider on file.   [facility] Providers Cardiologist:  Marca Ancona, MD Electrophysiologist:  Sherryl Manges, MD  Advanced Heart Failure:  Arvilla Meres, MD     Evaluation Performed:  Consultation - [name] was referred by Marca Ancona, MD for the evaluation of OSA.  Chief Complaint:  OSA  History of Present Illness:    [name] [name] is a 62 y.o. male who is being seen today for the evaluation of OSA at the request of Marca Ancona, MD.  Tillie Veldkamp is a 62 y.o. 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  He was initially referred for a split-night sleep study by Dr. Shirlee Latch in April 2022 which revealed severe obstructive sleep apnea with an AHI of 99.8/h and no significant central events.  He had nocturnal hypoxemia.  Due to ongoing respiratory events he failed his CPAP titration.  He subsequently underwent a BiPAP titration on 05/05/2021 and was titrated to 25 over 21 cm H2O.  He was subsequently started on auto BiPAP with IPAP max 20 cm H2O, EPAP min 5 cm H2O and pressure support of 4 cm H2O.  He is now referred for sleep medicine consultation to establish sleep care.  He is doing well with his PAP device and  thinks that he has gotten used to it.  He tolerates the mask and feels the pressure is adequate.  Since going on PAP he feels rested in the am and has no significant daytime sleepiness.  He denies any significant mouth or nasal dryness or nasal congestion.  He does not think that he snores.     Past Medical History:  Diagnosis Date   AICD (automatic cardioverter/defibrillator) present    Arthritis    Atrial fibrillation    CAD (coronary artery disease)    Mild nonobstructive (Cath 09)   Chronic systolic heart failure    Nonischemic CM: echo 4/12 with EF 25-30%, grade 2 diast dysfxn, mild dilated aortic root 43 mm, trivial MR, mod LAE   COPD (chronic obstructive pulmonary disease)    Dyspnea    with exertion    Ejection fraction < 50% 08/2019   20-25% noted on ECHO   H/O ETOH abuse    HTN (hypertension)    Hypersomnia    Nonischemic cardiomyopathy    Obesity, morbid    OSA on CPAP    Systolic and diastolic CHF, acute on chronic 11/2016   Tobacco use disorder    Ventricular tachycardia    Past Surgical History:  Procedure Laterality Date   CARDIAC CATHETERIZATION N/A 04/08/2015   Procedure: Right/Left Heart Cath and Coronary Angiography;  Surgeon: Corky Crafts, MD;  Location: [facility] INVASIVE CV LAB;  Service: Cardiovascular;  Laterality: N/A;   EP IMPLANTABLE DEVICE N/A 04/14/2015   Procedure: BiV ICD Insertion CRT-D;  Surgeon: [name], MD;  Location: [facility] INVASIVE CV LAB;  Service: Cardiovascular;  Laterality: N/A;   HYDROCELE EXCISION / REPAIR     ICD GENERATOR CHANGEOUT N/A 02/20/2022   Procedure: ICD GENERATOR CHANGEOUT;  Surgeon: [name], MD;  Location: [facility] INVASIVE CV LAB;  Service: Cardiovascular;  Laterality: N/A;   ICD IMPLANT     TOTAL HIP ARTHROPLASTY Right 11/04/2018   Procedure: TOTAL HIP ARTHROPLASTY ANTERIOR APPROACH;  Surgeon: [name], MD;  Location: [facility];  Service: Orthopedics;  Laterality: Right;   TOTAL KNEE ARTHROPLASTY Right 01/30/2020    Procedure: TOTAL KNEE ARTHROPLASTY;  Surgeon: [name], MD;  Location: [facility];  Service: Orthopedics;  Laterality: Right;   TOTAL KNEE ARTHROPLASTY Left 07/30/2020   Procedure: TOTAL KNEE ARTHROPLASTY;  Surgeon: [name], MD;  Location: [facility];  Service: Orthopedics;  Laterality: Left;   TUMOR EXCISION Left    TUMOR EXCISION Left 1982   Benign 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     Allergies:   Patient has no known allergies.   Social History   Tobacco Use   Smoking status: Former    Packs/day: 0.50    Years: 4.00    Additional pack years: 0.00    Total pack years: 2.00    Types: Cigarettes    Quit date: 10/29/2005    Years since quitting: 17.1   Smokeless tobacco: Never   Tobacco comments:    about a pack a week. social  Vaping Use   Vaping Use: Never used  Substance Use Topics   Alcohol use: Yes    Comment: 12 beers per week    Drug use: Not Currently    Types: Marijuana     Family Hx: The patient's family history includes Congestive Heart Failure in his father; Coronary artery disease in an other family member; Healthy in his brother, daughter, father, sister, sister, son, and son; Heart Problems in his mother; Other in his mother.  ROS:   Please see the history of present illness.     All other systems reviewed and are negative.   Prior Sleep studies:   The following studies were reviewed today:  Split-night sleep study, BiPAP titration, PAP compliance download  Labs/Other Tests and Data Reviewed:     Recent Labs: 08/22/2022: BUN 17; Creatinine, Ser 1.25; Hemoglobin 11.2; Magnesium 2.4; Platelets 173; Potassium 4.1; Sodium 140 10/30/2022: ALT 24; TSH 1.440    Wt Readings from Last 3 Encounters:  10/30/22 (!) 321 lb (145.6 kg)  08/22/22 (!) 312 lb 12.8 oz (141.9 kg)  06/21/22 (!) 315 lb 6.4 oz (143.1 kg)     Risk  Assessment/Calculations:          Objective:    Vital Signs:  There were no vitals taken for this visit.   VITAL SIGNS:  reviewed GEN:  no acute distress EYES:  sclerae anicteric, EOMI - Extraocular Movements Intact RESPIRATORY:  normal respiratory effort, symmetric expansion CARDIOVASCULAR:  no peripheral edema SKIN:  no rash, lesions or ulcers. MUSCULOSKELETAL:  no obvious deformities. NEURO:  alert and oriented x 3, no obvious focal deficit PSYCH:  normal affect  ASSESSMENT & PLAN:    OSA - The patient is tolerating PAP therapy well without  any problems. The PAP download performed by his DME was personally reviewed and interpreted by me today and showed an AHI of 9 a course has not been there /hr on *** cm H2O with ***% compliance in using more than 4 hours nightly.  The patient has been using and benefiting from PAP use and will continue to benefit from therapy.   Hypertension BP is controlled at home Continue prescription drug management with Entresto 49-51 mg twice daily, spironolactone 20 mg daily with as needed refills  Time:   Today, I have spent *** minutes with the patient with telehealth technology discussing the above problems.     Medication Adjustments/Labs and Tests Ordered: Current medicines are reviewed at length with the patient today.  Concerns regarding medicines are outlined above.   Tests Ordered: No orders of the defined types were placed in this encounter.   Medication Changes: No orders of the defined types were placed in this encounter.   Follow Up:  {F/U [phone]} {follow up:15908}  Signed, Armanda Magic, MD  12/20/2022 12:46 PM    [facility]

## 2022-12-21 NOTE — Telephone Encounter (Signed)
**Note De-Identified [name] [name] Obfuscation** Per letter received [name] fax from [employer], they have approved the pt for Entresto assistance until 12//31/2024. Pt ID: [id number]  The letter states that they have notified the pt of this approval as well.

## 2022-12-27 NOTE — Progress Notes (Signed)
Remote ICD transmission.   

## 2023-01-08 ENCOUNTER — Ambulatory Visit: Payer: Medicare HMO | Attending: Internal Medicine

## 2023-01-08 DIAGNOSIS — Z95810 Presence of automatic (implantable) cardiac defibrillator: Secondary | ICD-10-CM | POA: Diagnosis not present

## 2023-01-08 DIAGNOSIS — I5022 Chronic systolic (congestive) heart failure: Secondary | ICD-10-CM | POA: Diagnosis not present

## 2023-01-10 ENCOUNTER — Telehealth: Payer: Self-pay

## 2023-01-10 NOTE — Progress Notes (Signed)
EPIC Encounter for ICM Monitoring  Patient Name: [name] [name] is a 62 y.o. male Date: 01/10/2023 Primary Care Physican: No primary care provider on file. Primary Cardiologist: Shirlee Latch Electrophysiologist: Graciela Husbands BiV Pacing: 93.2% 10/30/2022 Office Weight: 321 lbs   Since 04-Dec-2022 Time in AT/AF  0.0 hr/day (0.0%)  Monitored VT-NS (>4 beats, >176 bpm)  2 VT (133-176 bpm)  1 1 monitored VT episodes, longest was 23 sec.     Attempted call to patient and unable to reach.  Left detailed message per DPR regarding transmission. Transmission reviewed.    Optivol Thoracic impedance suggesting normal fluid levels.     Prescribed:  Torsemide 20 mg 2 tablets (40 mg total) by mouth every other day alternating with 3 tablets (60 mg total) every other day.  Spironolactone 25 mg take 1 tablet daily.   Labs: 08/23/2023 Creatinine 1.25, BUN 17, Potassium 4.1, Sodium 140, GFR >60 06/21/2022 Creatinine 1.64, BUN 30, Potassium 4.9, Sodium 140, GFR 48 06/12/2022 Creatinine 1.24, BUN 15, Potassium 4.3, Sodium 137, GFR >60 05/25/2022 Creatinine 2.04, BUN 36, Potassium 4.5, Sodium 139, GFR 37 05/17/2022 Creatinine 2.63, BUN 53, Potassium 4.4, Sodium 137, GFR 27 02/07/2022 Creatinine 1.32, BUN 22, Potassium 4.1, Sodium 139, GFr 62 12/01/2021 Creatinine 1.28, BUN 15, Potassium 4.1, Sodium 140, GFR >60 A complete set of results can be found in Results Review.   Recommendations: Left voice mail with ICM number and encouraged to call if experiencing any fluid symptoms.   Follow-up plan: ICM clinic phone appointment on 02/13/2023.   91 day device clinic remote transmission 02/21/2023.     EP/Cardiology Office Visits:   04/20/2023 with Dr Graciela Husbands.     Copy of ICM check sent to Dr. Graciela Husbands.    3 month ICM trend: 01/08/2023.    12-14 Month ICM trend:     [name], RN 01/10/2023 3:04 PM

## 2023-01-10 NOTE — Telephone Encounter (Signed)
Remote ICM transmission received.  Attempted call to patient regarding ICM remote transmission and left detailed message per DPR.  Advised to return call for any fluid symptoms or questions. Next ICM remote transmission scheduled 02/13/2023.    

## 2023-01-12 ENCOUNTER — Telehealth: Payer: Self-pay

## 2023-01-12 NOTE — Telephone Encounter (Signed)
Monitored VT episode with histograms and presenting rhythm.

## 2023-01-15 NOTE — Telephone Encounter (Signed)
Wuill continue to monitor for prolonged VT nonsustained

## 2023-01-17 ENCOUNTER — Telehealth: Payer: Self-pay | Admitting: Internal Medicine

## 2023-01-17 NOTE — Telephone Encounter (Signed)
Pt c/o medication issue:  1. Name of Medication: sacubitril-valsartan (ENTRESTO) 49-51 MG   2. How are you currently taking this medication (dosage and times per day)? N/A  3. Are you having a reaction (difficulty breathing--STAT)? N/A  4. What is your medication issue? CoverMyMeds is calling stating the quantity is missing from the assistance application for this medication. They are requesting a callback to clarify the amount. Please advise.

## 2023-01-18 DIAGNOSIS — G5602 Carpal tunnel syndrome, left upper limb: Secondary | ICD-10-CM | POA: Diagnosis not present

## 2023-01-18 NOTE — Telephone Encounter (Signed)
**Note De-Identified Zanya Lindo Obfuscation** I called covermymeds back and s/w Tiffany who advised me that they just need to know the quantity of Entresto 49-51 mg the pt will need for 90 days. I verified that he needs #180/90 day supply with 3 refills.  Per Tiffany they are filling the pts Entresto and plan to ship out to the pt today. She thanked me for calling them back.

## 2023-01-30 ENCOUNTER — Encounter: Payer: Self-pay | Admitting: Cardiology

## 2023-01-30 ENCOUNTER — Ambulatory Visit: Payer: Medicare HMO | Attending: Cardiology | Admitting: Cardiology

## 2023-01-30 ENCOUNTER — Telehealth: Payer: Self-pay | Admitting: *Deleted

## 2023-01-30 VITALS — BP 110/66 | HR 64 | Ht 76.0 in | Wt 326.2 lb

## 2023-01-30 DIAGNOSIS — G4733 Obstructive sleep apnea (adult) (pediatric): Secondary | ICD-10-CM | POA: Diagnosis not present

## 2023-01-30 DIAGNOSIS — I10 Essential (primary) hypertension: Secondary | ICD-10-CM | POA: Diagnosis not present

## 2023-01-30 NOTE — Progress Notes (Signed)
Sleep Medicine CONSULT Note    Date:  01/30/2023   ID:  [name] [name], DOB 1961/04/21, MRN [id number]  PCP:  No primary care provider on file.  Cardiologist: Marca Ancona, MD   Chief Complaint  Patient presents with   New Patient (Initial Visit)    OSA    History of Present Illness:  [name] [name] is a 62 y.o. male who is being seen today for the evaluation of obstructive sleep apnea at the request of Marca Ancona, MD.  This is a 62 year old male with a history of nonischemic cardiomyopathy, chronic systolic heart failure, CAD, hypertension, ventricular tachycardia. He has a dx of OSA and had CPAP for years but in 2022 mentioned to Dr. Shirlee Latch that it was not working.  When he was seen by Dr. Shirlee Latch back in February 2022 and ordered a split-night BiPAP study showing severe obstructive sleep apnea with an AHI of 99.8/h with no central events.  He underwent CPAP titration but not be adequately titrated on CPAP.  He then went back to the lab for a BiPAP titration and was started on auto BiPAP with an IPAP max of 20 cm H2O, EPAP min 6 5 cm H2O and pressure support of 4 cm H2O.  He is now referred for sleep medicine consultation to establish sleep care.  He has tried to use the BiPAP but says that it stops blowing air during the night.  He tolerates the full face mask but feels like the pressure is fine at first but then during the night says that the device does not produce air.  He has a lot of problems with mouth dryness but no nasal dryness.  Past Medical History:  Diagnosis Date   AICD (automatic cardioverter/defibrillator) present    Arthritis    Atrial fibrillation (HCC)    CAD (coronary artery disease)    Mild nonobstructive (Cath 09)   Chronic systolic heart failure (HCC)    Nonischemic CM: echo 4/12 with EF 25-30%, grade 2 diast dysfxn, mild dilated aortic root 43 mm, trivial MR, mod LAE   COPD (chronic obstructive pulmonary disease) (HCC)    Dyspnea    with exertion     Ejection fraction < 50% 08/2019   20-25% noted on ECHO   H/O ETOH abuse    HTN (hypertension)    Hypersomnia    Nonischemic cardiomyopathy (HCC)    Obesity, morbid (HCC)    OSA on CPAP    Systolic and diastolic CHF, acute on chronic (HCC) 11/2016   Tobacco use disorder    Ventricular tachycardia (HCC)     Past Surgical History:  Procedure Laterality Date   CARDIAC CATHETERIZATION N/A 04/08/2015   Procedure: Right/Left Heart Cath and Coronary Angiography;  Surgeon: Corky Crafts, MD;  Location: [facility] INVASIVE CV LAB;  Service: Cardiovascular;  Laterality: N/A;   EP IMPLANTABLE DEVICE N/A 04/14/2015   Procedure: BiV ICD Insertion CRT-D;  Surgeon: [name], MD;  Location: [facility] INVASIVE CV LAB;  Service: Cardiovascular;  Laterality: N/A;   HYDROCELE EXCISION / REPAIR     ICD GENERATOR CHANGEOUT N/A 02/20/2022   Procedure: ICD GENERATOR CHANGEOUT;  Surgeon: [name], MD;  Location: [facility] INVASIVE CV LAB;  Service: Cardiovascular;  Laterality: N/A;   ICD IMPLANT     TOTAL HIP ARTHROPLASTY Right 11/04/2018   Procedure: TOTAL HIP ARTHROPLASTY ANTERIOR APPROACH;  Surgeon: [name], MD;  Location: [facility] ORS;  Service: Orthopedics;  Laterality: Right;   TOTAL KNEE ARTHROPLASTY  Right 01/30/2020   Procedure: TOTAL KNEE ARTHROPLASTY;  Surgeon: [name], MD;  Location: [facility] ORS;  Service: Orthopedics;  Laterality: Right;   TOTAL KNEE ARTHROPLASTY Left 07/30/2020   Procedure: TOTAL KNEE ARTHROPLASTY;  Surgeon: [name], MD;  Location: [facility] ORS;  Service: Orthopedics;  Laterality: Left;   TUMOR EXCISION Left    TUMOR EXCISION Left 1982   Benign tumor removed from L leg   VASECTOMY      Current Medications: Current Meds  Medication Sig   albuterol (VENTOLIN HFA) 108 (90 Base) MCG/ACT inhaler Inhale 1 puff into the lungs every 6 (six) hours as needed for wheezing or shortness of breath.   amiodarone (PACERONE) 200 MG tablet TAKE 1 TABLET EVERY DAY   Ascorbic Acid (VITAMIN C) 100 MG  tablet Take 100 mg by mouth daily.   atorvastatin (LIPITOR) 80 MG tablet TAKE 1 TABLET EVERY EVENING   ibuprofen (ADVIL) 200 MG tablet Take 400 mg by mouth every 8 (eight) hours as needed for moderate pain.   sacubitril-valsartan (ENTRESTO) 49-51 MG Take 1 tablet by mouth 2 (two) times daily.   sotalol (BETAPACE) 80 MG tablet TAKE 1 TABLET EVERY 12 HOURS   spironolactone (ALDACTONE) 25 MG tablet TAKE 1 TABLET EVERY DAY   torsemide (DEMADEX) 20 MG tablet Take 3 tablets (60 mg total) by mouth every other day AND 2 tablets (40 mg total) every other day. Take 60 mg alternating every other day with 40 mg.   XARELTO 20 MG TABS tablet TAKE 1 TABLET DAILY WITH SUPPER.    Allergies:   Patient has no known allergies.   Social History   Socioeconomic History   Marital status: Single    Spouse name: Not on file   Number of children: 2   Years of education: Not on file   Highest education level: Not on file  Occupational History   Occupation: scrap metal   Occupation: disabled, odd jobs  Tobacco Use   Smoking status: Former    Packs/day: 0.50    Years: 4.00    Additional pack years: 0.00    Total pack years: 2.00    Types: Cigarettes    Quit date: 10/29/2005    Years since quitting: 17.2   Smokeless tobacco: Never   Tobacco comments:    about a pack a week. social  Vaping Use   Vaping Use: Never used  Substance and Sexual Activity   Alcohol use: Yes    Comment: 12 beers per week    Drug use: Not Currently    Types: Marijuana   Sexual activity: Yes    Partners: Female  Other Topics Concern   Not on file  Social History Narrative   ** Merged History Encounter **       Social Determinants of Health   Financial Resource Strain: Low Risk  (05/15/2021)   Overall Financial Resource Strain (CARDIA)    Difficulty of Paying Living Expenses: Not hard at all  Food Insecurity: No Food Insecurity (05/15/2021)   Hunger Vital Sign    Worried About Running Out of Food in the Last Year: Never  true    Ran Out of Food in the Last Year: Never true  Transportation Needs: No Transportation Needs (05/15/2021)   PRAPARE - Administrator, Civil Service (Medical): No    Lack of Transportation (Non-Medical): No  Physical Activity: Insufficiently Active (05/15/2021)   Exercise Vital Sign    Days of Exercise per Week: 2 days  Minutes of Exercise per Session: 20 min  Stress: No Stress Concern Present (05/15/2021)   [employer] of Occupational Health - Occupational Stress Questionnaire    Feeling of Stress : Not at all  Social Connections: Moderately Isolated (05/15/2021)   Social Connection and Isolation Panel [NHANES]    Frequency of Communication with Friends and Family: More than three times a week    Frequency of Social Gatherings with Friends and Family: More than three times a week    Attends Religious Services: 1 to 4 times per year    Active Member of [employer] or Organizations: No    Attends Engineer, structural: Never    Marital Status: Divorced     Family History:  The patient's family history includes Congestive Heart Failure in his father; Coronary artery disease in an other family member; Healthy in his brother, daughter, father, sister, sister, son, and son; Heart Problems in his mother; Other in his mother.   ROS:   Please see the history of present illness.    ROS All other systems reviewed and are negative.      No data to display             PHYSICAL EXAM:   VS:  BP 110/66   Pulse 64   Ht 6\' 4"  (1.93 m)   Wt (!) 326 lb 3.2 oz (148 kg)   SpO2 96%   BMI 39.71 kg/m    GEN: Well nourished, well developed, in no acute distress  HEENT: normal  Neck: no JVD, carotid bruits, or masses Cardiac: RRR; no murmurs, rubs, or gallops,no edema.  Intact distal pulses bilaterally.  Respiratory:  clear to auscultation bilaterally, normal work of breathing GI: soft, nontender, nondistended, + BS MS: no deformity or atrophy  Skin: warm and dry,  no rash Neuro:  Alert and Oriented x 3, Strength and sensation are intact Psych: euthymic mood, full affect  Wt Readings from Last 3 Encounters:  01/30/23 (!) 326 lb 3.2 oz (148 kg)  10/30/22 (!) 321 lb (145.6 kg)  08/22/22 (!) 312 lb 12.8 oz (141.9 kg)      Studies/Labs Reviewed:   Split-night sleep study, BiPAP titration, PAP compliance download  Recent Labs: 08/22/2022: BUN 17; Creatinine, Ser 1.25; Hemoglobin 11.2; Magnesium 2.4; Platelets 173; Potassium 4.1; Sodium 140 10/30/2022: ALT 24; TSH 1.440     Additional studies/ records that were reviewed today include:  none    ASSESSMENT:    1. OSA (obstructive sleep apnea)   2. Essential hypertension      PLAN:  In order of problems listed above:  OSA -he tells me that he has been using his device but the download registers that he has not been using it -he also says that it does not seem to be pushing out any air in the middle of the night and also cannot get a download on it -I will get him an appt with the DME to check his device to find out why it is shutting off and teach him how to adjust the humidity  2.  Hypertension -BP controlled on exam today -Continue prescription drug management with Entresto 49-51 mg twice daily and Aldactone 25 mg daily with as needed refills  I will see him back in about 6 weeks  Time Spent: 20 minutes total time of encounter, including 15 minutes spent in face-to-face patient care on the date of this encounter. This time includes coordination of care and counseling regarding above mentioned problem  list. Remainder of non-face-to-face time involved reviewing chart documents/testing relevant to the patient encounter and documentation in the medical record. I have independently reviewed documentation from referring provider  Medication Adjustments/Labs and Tests Ordered: Current medicines are reviewed at length with the patient today.  Concerns regarding medicines are outlined above.   Medication changes, Labs and Tests ordered today are listed in the Patient Instructions below.  There are no Patient Instructions on file for this visit.   Signed, Armanda Magic, MD  01/30/2023 1:56 PM    [facility] [address] [place], [place], [place]  [address] Phone: [phone]; Fax: [phone]

## 2023-01-30 NOTE — Addendum Note (Signed)
Addended by: Armanda Magic R on: 01/30/2023 02:03 PM   Modules accepted: Level of Service

## 2023-01-30 NOTE — Telephone Encounter (Signed)
Per Dr Mayford Knife, get him an appt with the DME to check his device to find out why it is shutting off and teach him how to adjust the humidity and also find out why it is not registering that he his using it - he says that he uses it every night.  Order placed to [employer] [facility] with [facility]. Patient will bring his chip to [facility] for download on Thursday May 16th.

## 2023-01-30 NOTE — Patient Instructions (Signed)
Medication Instructions:  Your physician recommends that you continue on your current medications as directed. Please refer to the Current Medication list given to you today.  *If you need a refill on your cardiac medications before your next appointment, please call your pharmacy*  Lab Work: None ordered today.  Testing/Procedures: None ordered today.  Follow-Up: At [facility], you and your health needs are our priority.  As part of our continuing mission to provide you with exceptional heart care, we have created designated Provider Care Teams.  These Care Teams include your primary Cardiologist (physician) and Advanced Practice Providers (APPs -  Physician Assistants and Nurse Practitioners) who all work together to provide you with the care you need, when you need it.  Your next appointment:   6 week(s) after DME appointment  The format for your next appointment:   In Person  Provider:   Armanda Magic, MD

## 2023-02-13 ENCOUNTER — Ambulatory Visit: Payer: Medicare HMO | Attending: Internal Medicine

## 2023-02-13 DIAGNOSIS — Z95810 Presence of automatic (implantable) cardiac defibrillator: Secondary | ICD-10-CM | POA: Diagnosis not present

## 2023-02-13 DIAGNOSIS — I5022 Chronic systolic (congestive) heart failure: Secondary | ICD-10-CM

## 2023-02-15 NOTE — Progress Notes (Signed)
EPIC Encounter for ICM Monitoring  Patient Name: [name] [name] is a 62 y.o. male Date: 02/15/2023 Primary Care Physican: No primary care provider on file. Primary Cardiologist: Shirlee Latch Electrophysiologist: Graciela Husbands BiV Pacing: 93.3% 10/30/2022 Office Weight: 321 lbs 02/15/2023 Weight: 320 lbs   Since 08-Jan-2023 Time in AT/AF  0.0 hr/day (0.0%)   Monitored VT-NS (>4 beats, >176 bpm)  1  Spoke with patient and heart failure questions reviewed.  Transmission results reviewed.  Pt asymptomatic for fluid accumulation.  Reports feeling well at this time and voices no complaints.     Optivol Thoracic impedance suggesting normal fluid levels.     Prescribed:  Torsemide 20 mg 2 tablets (40 mg total) by mouth every other day alternating with 3 tablets (60 mg total) every other day.  Spironolactone 25 mg take 1 tablet daily.   Labs: 08/23/2023 Creatinine 1.25, BUN 17, Potassium 4.1, Sodium 140, GFR >60 06/21/2022 Creatinine 1.64, BUN 30, Potassium 4.9, Sodium 140, GFR 48 06/12/2022 Creatinine 1.24, BUN 15, Potassium 4.3, Sodium 137, GFR >60 05/25/2022 Creatinine 2.04, BUN 36, Potassium 4.5, Sodium 139, GFR 37 05/17/2022 Creatinine 2.63, BUN 53, Potassium 4.4, Sodium 137, GFR 27 02/07/2022 Creatinine 1.32, BUN 22, Potassium 4.1, Sodium 139, GFr 62 12/01/2021 Creatinine 1.28, BUN 15, Potassium 4.1, Sodium 140, GFR >60 A complete set of results can be found in Results Review.   Recommendations: No changes and encouraged to call if experiencing any fluid symptoms..   Follow-up plan: ICM clinic phone appointment on 03/19/2023.   91 day device clinic remote transmission 02/21/2023.     EP/Cardiology Office Visits:   04/20/2023 with Dr Graciela Husbands.     Copy of ICM check sent to Dr. Graciela Husbands.    3 month ICM trend: 02/13/2023.    12-14 Month ICM trend:     [name], RN 02/15/2023 3:02 PM

## 2023-02-21 ENCOUNTER — Ambulatory Visit (INDEPENDENT_AMBULATORY_CARE_PROVIDER_SITE_OTHER): Payer: Medicare HMO

## 2023-02-21 DIAGNOSIS — I428 Other cardiomyopathies: Secondary | ICD-10-CM

## 2023-02-21 LAB — CUP PACEART REMOTE DEVICE CHECK
Battery Remaining Longevity: 83 mo
Battery Voltage: 3 V
Brady Statistic AP VP Percent: 10.58 %
Brady Statistic AP VS Percent: 2.63 %
Brady Statistic AS VP Percent: 86 %
Brady Statistic AS VS Percent: 0.79 %
Brady Statistic RA Percent Paced: 13.11 %
Brady Statistic RV Percent Paced: 95.85 %
Date Time Interrogation Session: 20240605001702
HighPow Impedance: 64 Ohm
Implantable Lead Connection Status: 753985
Implantable Lead Connection Status: 753985
Implantable Lead Connection Status: 753985
Implantable Lead Implant Date: 20160727
Implantable Lead Implant Date: 20160727
Implantable Lead Implant Date: 20160727
Implantable Lead Location: 753858
Implantable Lead Location: 753859
Implantable Lead Location: 753860
Implantable Lead Model: 4598
Implantable Lead Model: 5076
Implantable Lead Model: 6935
Implantable Pulse Generator Implant Date: 20230605
Lead Channel Impedance Value: 175.622
Lead Channel Impedance Value: 180.5 Ohm
Lead Channel Impedance Value: 184.154
Lead Channel Impedance Value: 189.525
Lead Channel Impedance Value: 189.525
Lead Channel Impedance Value: 342 Ohm
Lead Channel Impedance Value: 361 Ohm
Lead Channel Impedance Value: 361 Ohm
Lead Channel Impedance Value: 361 Ohm
Lead Channel Impedance Value: 399 Ohm
Lead Channel Impedance Value: 418 Ohm
Lead Channel Impedance Value: 418 Ohm
Lead Channel Impedance Value: 456 Ohm
Lead Channel Impedance Value: 608 Ohm
Lead Channel Impedance Value: 646 Ohm
Lead Channel Impedance Value: 646 Ohm
Lead Channel Impedance Value: 665 Ohm
Lead Channel Impedance Value: 703 Ohm
Lead Channel Pacing Threshold Amplitude: 0.625 V
Lead Channel Pacing Threshold Amplitude: 0.625 V
Lead Channel Pacing Threshold Amplitude: 0.75 V
Lead Channel Pacing Threshold Pulse Width: 0.4 ms
Lead Channel Pacing Threshold Pulse Width: 0.4 ms
Lead Channel Pacing Threshold Pulse Width: 1 ms
Lead Channel Sensing Intrinsic Amplitude: 1.5 mV
Lead Channel Sensing Intrinsic Amplitude: 1.5 mV
Lead Channel Sensing Intrinsic Amplitude: 3.75 mV
Lead Channel Sensing Intrinsic Amplitude: 3.75 mV
Lead Channel Setting Pacing Amplitude: 1.25 V
Lead Channel Setting Pacing Amplitude: 1.5 V
Lead Channel Setting Pacing Amplitude: 2 V
Lead Channel Setting Pacing Pulse Width: 0.4 ms
Lead Channel Setting Pacing Pulse Width: 1 ms
Lead Channel Setting Sensing Sensitivity: 0.3 mV
Zone Setting Status: 755011

## 2023-03-05 ENCOUNTER — Other Ambulatory Visit (HOSPITAL_COMMUNITY): Payer: Self-pay | Admitting: Cardiology

## 2023-03-09 ENCOUNTER — Other Ambulatory Visit (HOSPITAL_COMMUNITY): Payer: Self-pay

## 2023-03-09 MED ORDER — TORSEMIDE 20 MG PO TABS: ORAL_TABLET | ORAL | 0 refills | Status: DC

## 2023-03-09 NOTE — Telephone Encounter (Signed)
Meds ordered this encounter  Medications   torsemide (DEMADEX) 20 MG tablet    Sig: TAKE 3 TABLETS (60 MG TOTAL) EVERY MORNING AND 2 TABLETS (40 MG TOTAL) EVERY EVENING    Dispense:  450 tablet    Refill:  0

## 2023-03-14 ENCOUNTER — Other Ambulatory Visit (HOSPITAL_COMMUNITY): Payer: Self-pay | Admitting: Cardiology

## 2023-03-16 NOTE — Progress Notes (Signed)
Remote ICD transmission.   

## 2023-03-19 ENCOUNTER — Ambulatory Visit: Payer: Medicare HMO | Attending: Internal Medicine

## 2023-03-19 DIAGNOSIS — Z95810 Presence of automatic (implantable) cardiac defibrillator: Secondary | ICD-10-CM | POA: Diagnosis not present

## 2023-03-19 DIAGNOSIS — I5022 Chronic systolic (congestive) heart failure: Secondary | ICD-10-CM

## 2023-03-21 NOTE — Progress Notes (Signed)
EPIC Encounter for ICM Monitoring  Patient Name: [name] [name] is a 62 y.o. male Date: 03/21/2023 Primary Care Physican: No primary care provider on file. Primary Cardiologist: Shirlee Latch Electrophysiologist: Graciela Husbands BiV Pacing: 96.5% 03/21/2023 Weight: 320 lbs   Since 21-Feb-2023 Time in AT/AF  0.0 hr/day (0.0%)   Spoke with patient and heart failure questions reviewed.  Transmission results reviewed.  Pt asymptomatic for fluid accumulation.  Reports feeling well at this time and voices no complaints.     Optivol Thoracic impedance suggesting normal fluid levels.     Prescribed:  Torsemide 20 mg 2 tablets (40 mg total) by mouth every other day alternating with 3 tablets (60 mg total) every other day.  Spironolactone 25 mg take 1 tablet daily.   Labs: 08/23/2023 Creatinine 1.25, BUN 17, Potassium 4.1, Sodium 140, GFR >60 06/21/2022 Creatinine 1.64, BUN 30, Potassium 4.9, Sodium 140, GFR 48 06/12/2022 Creatinine 1.24, BUN 15, Potassium 4.3, Sodium 137, GFR >60 05/25/2022 Creatinine 2.04, BUN 36, Potassium 4.5, Sodium 139, GFR 37 05/17/2022 Creatinine 2.63, BUN 53, Potassium 4.4, Sodium 137, GFR 27 02/07/2022 Creatinine 1.32, BUN 22, Potassium 4.1, Sodium 139, GFr 62 12/01/2021 Creatinine 1.28, BUN 15, Potassium 4.1, Sodium 140, GFR >60 A complete set of results can be found in Results Review.   Recommendations:   No changes and encouraged to call if experiencing any fluid symptoms.   Follow-up plan: ICM clinic phone appointment on 04/23/2023.   91 day device clinic remote transmission 05/23/2023.     EP/Cardiology Office Visits:   04/20/2023 with Dr Graciela Husbands.     Copy of ICM check sent to Dr. Graciela Husbands.    3 month ICM trend: 03/19/2023.    12-14 Month ICM trend:     [name], RN 03/21/2023 8:52 AM

## 2023-03-30 ENCOUNTER — Telehealth: Payer: Self-pay

## 2023-03-30 NOTE — Telephone Encounter (Signed)
-----   Message from [facility] Apolinar Junes J sent at 03/26/2023 10:08 AM EDT ----- Yes, I called the pt and he had his appt and he states his machine is working better for him. ----- Message ----- From: Luellen Pucker, RN Sent: 03/21/2023   4:37 PM EDT To: Reesa Chew, CMA; Lars Mage, RN  Trying to figure out if he needs a new appt with Dr. Mayford Knife. He was just seen in 01/30/2023. Looks like an in-person appt w/ DME company was ordered so his machine could be checked and so he could be taught how to adjust the humidity. Any way to tell if that was done? If so then yes I can schedule him for his 6 month follow up.  Alcario Drought ----- Message ----- From: Lars Mage, RN Sent: 03/21/2023   3:08 PM EDT To: Luellen Pucker, RN   ----- Message ----- From: Reesa Chew, CMA Sent: 03/20/2023  12:13 PM EDT To: Lars Mage, RN  He has a cpap. He was set up  on 04/09/22.   Thanks, [facility] ----- Message ----- From: Lars Mage, RN Sent: 03/16/2023   8:39 AM EDT To: Reesa Chew, CMA  [facility],  Is there a way to know if he has gotten his CPAP yet? ----- Message ----- From: Franchot Gallo Sent: 02/07/2023   5:32 PM EDT To: Luellen Pucker, RN   ----- Message ----- From: Reesa Chew, CMA Sent: 02/07/2023   5:12 PM EDT To: Demetrios Loll L  Appt will be made after he gets his cpap. Thanks ----- Message ----- From: Franchot Gallo Sent: 01/30/2023   2:05 PM EDT To: Reesa Chew, CMA  Hi Nina,  Follow-up to Dr. Norris Cross message regarding Mr. Ezekiel. She said she wants him to follow-up with her  6 weeks after his appointment with his DME.  Thank you, Danford Bad

## 2023-03-30 NOTE — Telephone Encounter (Signed)
Called to schedule patient a 6 week f/u appt, no answer. Left message with no identifiers asking recipient to call [facility] at [phone].

## 2023-04-04 ENCOUNTER — Other Ambulatory Visit (HOSPITAL_COMMUNITY): Payer: Self-pay | Admitting: Cardiology

## 2023-04-20 ENCOUNTER — Encounter: Payer: Self-pay | Admitting: Internal Medicine

## 2023-04-20 ENCOUNTER — Ambulatory Visit: Payer: Medicare HMO | Attending: Internal Medicine | Admitting: Internal Medicine

## 2023-04-20 VITALS — BP 96/64 | HR 54 | Ht 77.0 in | Wt 329.0 lb

## 2023-04-20 DIAGNOSIS — E039 Hypothyroidism, unspecified: Secondary | ICD-10-CM

## 2023-04-20 DIAGNOSIS — Z79899 Other long term (current) drug therapy: Secondary | ICD-10-CM

## 2023-04-20 DIAGNOSIS — I428 Other cardiomyopathies: Secondary | ICD-10-CM

## 2023-04-20 MED ORDER — TORSEMIDE 20 MG PO TABS: ORAL_TABLET | ORAL | Status: DC

## 2023-04-20 NOTE — Progress Notes (Signed)
POTS 10 months 3 CRT-D      Patient Care Team: [name], NP as PCP - General (Nurse Practitioner) [name] [name], MD as PCP - Advanced Heart Failure (Cardiology) [name], MD as PCP - Electrophysiology (Cardiology) [name], MD as PCP - Cardiology (Cardiology) [name], MD (Family Medicine) [name], LCSW as Social Worker (Licensed Clinical Social Worker)   HPI  [name] [name] is a 61 y.o. 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    LV programed subtherapeutic with a QRS duration of 140 ms.  Because of RBBB AND PROGRAMed FUSION PATIENT>>  Dyspnea is better since the reprogramming. GEN change 6/23  Atrial fibrillation and polymorphic ventricular tachycardia Rx w amiodarone therapy.     Recurrent ventricular tachycardia 9/18 potassium levels were normal at that time.   With failed shocks and amiodarone was continued and sotalol was initiated.  Intercurrent ventricular tachycardia treated with ATP    The patient denies chest pain, nocturnal dyspnea, or peripheral edema.  There have been no palpitations or syncope. .    Date Cr K Mg TSH LFTs Hgb PFTs  9/18  1.02 3.8 2.3         11/18     0.860 36 11.0    12/22 1.18 3.9 2.3 (5/22) 1.085 16 11.8   12/23 1.25<<2.6 4.1    11.2              DATE TEST EF   12/21 Echo 20-25 % LV global hypokinesis and hypertrophy, G1 diastolic dysfunction  12/22 Echo 20-25%   4/23 PYP  Equivocal       Past Medical History:  Diagnosis Date   AICD (automatic cardioverter/defibrillator) present    Arthritis    Atrial fibrillation (HCC)    CAD (coronary artery disease)    Mild nonobstructive (Cath 09)   Chronic systolic heart failure (HCC)    Nonischemic CM: echo 4/12 with EF 25-30%, grade 2 diast dysfxn, mild dilated aortic root 43 mm, trivial MR,  mod LAE   COPD (chronic obstructive pulmonary disease) (HCC)    Dyspnea    with exertion    Ejection fraction < 50% 08/2019   20-25% noted on ECHO   H/O ETOH abuse    HTN (hypertension)    Hypersomnia    Nonischemic cardiomyopathy (HCC)    Obesity, morbid (HCC)    OSA on CPAP    Systolic and diastolic CHF, acute on chronic (HCC) 11/2016   Tobacco use disorder    Ventricular tachycardia (HCC)     Past Surgical History:  Procedure Laterality Date   CARDIAC CATHETERIZATION N/A 04/08/2015   Procedure: Right/Left Heart Cath and Coronary Angiography;  Surgeon: Corky Crafts, MD;  Location: [facility] INVASIVE CV LAB;  Service: Cardiovascular;  Laterality: N/A;   EP IMPLANTABLE DEVICE N/A 04/14/2015   Procedure: BiV ICD Insertion CRT-D;  Surgeon: [name], MD;  Location: [facility] INVASIVE CV LAB;  Service: Cardiovascular;  Laterality: N/A;   HYDROCELE EXCISION / REPAIR     ICD GENERATOR CHANGEOUT N/A 02/20/2022   Procedure: ICD GENERATOR CHANGEOUT;  Surgeon: [name], MD;  Location: [facility] INVASIVE CV LAB;  Service: Cardiovascular;  Laterality: N/A;   ICD IMPLANT     TOTAL HIP ARTHROPLASTY Right 11/04/2018   Procedure: TOTAL HIP ARTHROPLASTY ANTERIOR APPROACH;  Surgeon: [name], MD;  Location: [facility] ORS;  Service: Orthopedics;  Laterality: Right;   TOTAL KNEE ARTHROPLASTY Right 01/30/2020   Procedure: TOTAL KNEE ARTHROPLASTY;  Surgeon: [name], MD;  Location: [facility] ORS;  Service: Orthopedics;  Laterality: Right;   TOTAL KNEE ARTHROPLASTY Left 07/30/2020   Procedure: TOTAL KNEE ARTHROPLASTY;  Surgeon: [name], MD;  Location: [facility] ORS;  Service: Orthopedics;  Laterality: Left;   TUMOR EXCISION Left    TUMOR EXCISION Left 1982   Benign tumor removed from L leg   VASECTOMY      Current Outpatient Medications  Medication Sig Dispense Refill   albuterol (VENTOLIN HFA) 108 (90 Base) MCG/ACT inhaler Inhale 1 puff into the lungs every 6 (six) hours as needed for wheezing or shortness of  breath. 18 g 3   amiodarone (PACERONE) 200 MG tablet TAKE 1 TABLET EVERY DAY 90 tablet 3   Ascorbic Acid (VITAMIN C) 100 MG tablet Take 100 mg by mouth daily.     atorvastatin (LIPITOR) 80 MG tablet TAKE 1 TABLET EVERY EVENING 90 tablet 3   ibuprofen (ADVIL) 200 MG tablet Take 400 mg by mouth every 8 (eight) hours as needed for moderate pain.     sacubitril-valsartan (ENTRESTO) 49-51 MG Take 1 tablet by mouth 2 (two) times daily. 180 tablet 3   sotalol (BETAPACE) 80 MG tablet Take 1 tablet (80 mg total) by mouth every 12 (twelve) hours. NEEDS FOLLOW UP APPOINTMENT FOR ANYMORE REFILLS 180 tablet 0   spironolactone (ALDACTONE) 25 MG tablet Take 1 tablet (25 mg total) by mouth daily. NEEDS FOLLOW UP APPOINTMENT FOR MORE REFILLS 90 tablet 0   torsemide (DEMADEX) 20 MG tablet TAKE 3 TABLETS (60 MG TOTAL) EVERY MORNING AND 2 TABLETS (40 MG TOTAL) EVERY EVENING 450 tablet 0   XARELTO 20 MG TABS tablet TAKE 1 TABLET DAILY WITH SUPPER. 90 tablet 3   No current facility-administered medications for this visit.    No Known Allergies    Review of Systems negative except from HPI and PMH  Physical Exam BP 96/64   Pulse (!) 54   Ht 6\' 5"  (1.956 m)   Wt (!) 329 lb (149.2 kg)   SpO2 97%   BMI 39.01 kg/m  Well developed and well nourished in no acute distress HENT normal Neck supple with JVP-8 positive HJR  clear Device pocket well healed; without hematoma or erythema.  There is no tethering  Regular rate and rhythm, no  gallop No  murmur Abd-soft with active BS No Clubbing cyanosis 2= normal.  Edema Skin-warm and dry A & Oriented  Grossly normal sensory and motor function  ECG sinus with P synchronous pacing with a QRS that is upright in lead I and negative in lead V1 with an offset of without evaluated at 80 ms and QRS duration of 184 ms  Device function is normal. Programming changes will leave the ventricular therapies unchanged; however, bradycardic with chronotropic incompetence  activated rate response See Paceart for details        ECG  # 1 sinus at 59 with P synchronous pacing Intervals 25/20/50  12/18 QRS Rs V1 and negative lead I QRSd approximately 200 ms  1/20 QRS upright lead I and negative lead V1 QRSd 158 ms 12/20 QRS negative lead I and upright V1 QRSd 218 ms 12/21 QRS negative lead V1 upright lead I QRSd 142 ms 8/22 QRS negative lead V1 upright lead I QRSd 140 ms  Assessment and  Plan  Nonischemic cardiomyopathy  Right bundle branch  block  Congestive heart failure-chronic-systolic  Ventricular tachycardia-polymorphic  Implantable defibrillator-CRT-Medtronic  PVCs  Atrial fibrillation-paroxysmal  Amiodarone therapy for the above  Sinus node dysfunction with chronotropic incompetence  SGLT2 intolerance  For his cardiomyopathy, continue Entresto and spironolactone; with his low blood pressure we will have him take spironolactone at night.  Will check his metabolic profile today.  Intercurrent ventricular tachycardia below the threshold of detection.  Will continue to monitor and continue his amiodarone 200 mg daily.  Need surveillance laboratories.\   No interval atrial fibrillation, no bleeding, continue anticoagulation  Volume overloaded with edema and jugular venous distention continue torsemide will transition from 60/40-80 every morning  Chronotropic incompetence and sinus bradycardia.  Will activated rate response         .

## 2023-04-20 NOTE — Patient Instructions (Signed)
Medication Instructions:  Your physician has recommended you make the following change in your medication:   ** Increase Torsemide 20mg  to 80mg  (4 tablets) every morning and continue 2 tablets (40mg ) every evening  *If you need a refill on your cardiac medications before your next appointment, please call your pharmacy*   Lab Work: CMET and TSH If you have labs (blood work) drawn today and your tests are completely normal, you will receive your results only by: MyChart Message (if you have MyChart) OR A paper copy in the mail If you have any lab test that is abnormal or we need to change your treatment, we will call you to review the results.   Testing/Procedures: None ordered.    Follow-Up: At [facility], you and your health needs are our priority.  As part of our continuing mission to provide you with exceptional heart care, we have created designated Provider Care Teams.  These Care Teams include your primary Cardiologist (physician) and Advanced Practice Providers (APPs -  Physician Assistants and Nurse Practitioners) who all work together to provide you with the care you need, when you need it.  We recommend signing up for the patient portal called "MyChart".  Sign up information is provided on this After Visit Summary.  MyChart is used to connect with patients for Virtual Visits (Telemedicine).  Patients are able to view lab/test results, encounter notes, upcoming appointments, etc.  Non-urgent messages can be sent to your provider as well.   To learn more about what you can do with MyChart, go to [email or web address].    Your next appointment:   12 months with Dr Graciela Husbands

## 2023-04-23 ENCOUNTER — Ambulatory Visit: Payer: Medicare HMO | Attending: Internal Medicine

## 2023-04-23 DIAGNOSIS — I5022 Chronic systolic (congestive) heart failure: Secondary | ICD-10-CM | POA: Diagnosis not present

## 2023-04-23 DIAGNOSIS — Z95810 Presence of automatic (implantable) cardiac defibrillator: Secondary | ICD-10-CM | POA: Diagnosis not present

## 2023-04-24 ENCOUNTER — Telehealth: Payer: Self-pay

## 2023-04-24 NOTE — Progress Notes (Signed)
EPIC Encounter for ICM Monitoring  Patient Name: [name] [name] is a 62 y.o. male Date: 04/24/2023 Primary Care Physican: [name], NP Primary Cardiologist: Shirlee Latch Electrophysiologist: Graciela Husbands BiV Pacing: 95.4% 03/21/2023 Weight: 320 lbs   Since 20-Apr-2023 Time in AT/AF  0.0 hr/day (0.0%)   Attempted call to patient and unable to reach.  Left detailed message per DPR regarding transmission. Transmission reviewed.    Optivol Thoracic impedance suggesting possible fluid accumulation starting 7/25 and returned closed to baseline (Torsemide dosage increased 8/2).     Prescribed:  Torsemide 20 mg 4 tablets (80 mg total) by mouth every morning and2 tablets (40 mg total) every evening  Spironolactone 25 mg take 1 tablet daily.   Labs: 04/20/2023 Creatinine 1.59, BUN 27, Potassium 4.2, Sodium 140, GFR 49  A complete set of results can be found in Results Review.   Recommendations:   Left voice mail with ICM number and encouraged to call if experiencing any fluid symptoms.   Follow-up plan: ICM clinic phone appointment on 05/28/2023.   91 day device clinic remote transmission 05/23/2023.     EP/Cardiology Office Visits:   04/20/2023 with Dr Graciela Husbands.     Copy of ICM check sent to Dr. Graciela Husbands.   3 month ICM trend: 04/23/2023.    12-14 Month ICM trend:     [name], RN 04/24/2023 8:11 AM

## 2023-04-24 NOTE — Telephone Encounter (Signed)
Remote ICM transmission received.  Attempted call to patient regarding ICM remote transmission and left detailed message per DPR.  Left ICM phone number and advised to return call for any fluid symptoms or questions. Next ICM remote transmission scheduled 05/28/2023.

## 2023-05-23 ENCOUNTER — Ambulatory Visit (INDEPENDENT_AMBULATORY_CARE_PROVIDER_SITE_OTHER): Payer: Medicare HMO

## 2023-05-23 DIAGNOSIS — I5022 Chronic systolic (congestive) heart failure: Secondary | ICD-10-CM

## 2023-05-23 LAB — CUP PACEART REMOTE DEVICE CHECK
Battery Remaining Longevity: 85 mo
Battery Voltage: 2.99 V
Brady Statistic AP VP Percent: 51.01 %
Brady Statistic AP VS Percent: 3.83 %
Brady Statistic AS VP Percent: 44.64 %
Brady Statistic AS VS Percent: 0.52 %
Brady Statistic RA Percent Paced: 54.62 %
Brady Statistic RV Percent Paced: 95.32 %
Date Time Interrogation Session: 20240904043624
HighPow Impedance: 61 Ohm
Implantable Lead Connection Status: 753985
Implantable Lead Connection Status: 753985
Implantable Lead Connection Status: 753985
Implantable Lead Implant Date: 20160727
Implantable Lead Implant Date: 20160727
Implantable Lead Implant Date: 20160727
Implantable Lead Location: 753858
Implantable Lead Location: 753859
Implantable Lead Location: 753860
Implantable Lead Model: 4598
Implantable Lead Model: 5076
Implantable Lead Model: 6935
Implantable Pulse Generator Implant Date: 20230605
Lead Channel Impedance Value: 175.622
Lead Channel Impedance Value: 175.622
Lead Channel Impedance Value: 180.5 Ohm
Lead Channel Impedance Value: 189.525
Lead Channel Impedance Value: 189.525
Lead Channel Impedance Value: 342 Ohm
Lead Channel Impedance Value: 361 Ohm
Lead Channel Impedance Value: 361 Ohm
Lead Channel Impedance Value: 361 Ohm
Lead Channel Impedance Value: 361 Ohm
Lead Channel Impedance Value: 399 Ohm
Lead Channel Impedance Value: 399 Ohm
Lead Channel Impedance Value: 456 Ohm
Lead Channel Impedance Value: 589 Ohm
Lead Channel Impedance Value: 608 Ohm
Lead Channel Impedance Value: 608 Ohm
Lead Channel Impedance Value: 608 Ohm
Lead Channel Impedance Value: 646 Ohm
Lead Channel Pacing Threshold Amplitude: 0.5 V
Lead Channel Pacing Threshold Amplitude: 0.875 V
Lead Channel Pacing Threshold Amplitude: 1.5 V
Lead Channel Pacing Threshold Pulse Width: 0.4 ms
Lead Channel Pacing Threshold Pulse Width: 0.4 ms
Lead Channel Pacing Threshold Pulse Width: 0.4 ms
Lead Channel Sensing Intrinsic Amplitude: 1.5 mV
Lead Channel Sensing Intrinsic Amplitude: 1.5 mV
Lead Channel Sensing Intrinsic Amplitude: 3.625 mV
Lead Channel Sensing Intrinsic Amplitude: 3.625 mV
Lead Channel Setting Pacing Amplitude: 1.5 V
Lead Channel Setting Pacing Amplitude: 2 V
Lead Channel Setting Pacing Amplitude: 2 V
Lead Channel Setting Pacing Pulse Width: 0.4 ms
Lead Channel Setting Pacing Pulse Width: 0.4 ms
Lead Channel Setting Sensing Sensitivity: 0.3 mV
Zone Setting Status: 755011

## 2023-05-24 ENCOUNTER — Telehealth: Payer: Self-pay

## 2023-05-24 NOTE — Telephone Encounter (Signed)
Forwarding to your basket for follow up per note.

## 2023-05-24 NOTE — Telephone Encounter (Signed)
Alert received from CV solutions:  Scheduled remote reviewed. Normal device function.  4 VHR detections, EGMs consistent with VT.  Longest VT lasted 2 minutes 24 seconds, rates as slow as 140 bpm and below treatment zone.  Within the monitoring period, HF diagnostics have been normal.  Pt had recent office visit with Dr. Graciela Husbands on 04/20/2023-noted at that time that Pt was having slow VT.  No med changes made at that time.  Will discuss with Dr. Graciela Husbands to see if any changes recommended.

## 2023-05-24 NOTE — Telephone Encounter (Signed)
Reviewed with Dr. Graciela Husbands.    See CV PROC remote check.  No action needed.  No slow VT in last 2 weeks.  Continue to monitor.

## 2023-05-28 ENCOUNTER — Ambulatory Visit: Payer: Medicare HMO | Attending: Internal Medicine

## 2023-05-28 ENCOUNTER — Other Ambulatory Visit (HOSPITAL_COMMUNITY): Payer: Self-pay | Admitting: Cardiology

## 2023-05-28 DIAGNOSIS — Z95810 Presence of automatic (implantable) cardiac defibrillator: Secondary | ICD-10-CM

## 2023-05-28 DIAGNOSIS — I5022 Chronic systolic (congestive) heart failure: Secondary | ICD-10-CM | POA: Diagnosis not present

## 2023-05-30 NOTE — Progress Notes (Signed)
EPIC Encounter for ICM Monitoring  Patient Name: [name] [name] is a 62 y.o. male Date: 05/30/2023 Primary Care Physican: [name], NP Primary Cardiologist: Shirlee Latch Electrophysiologist: Graciela Husbands BiV Pacing: 94.6% 03/21/2023 Weight: 320 lbs   Since 20-Apr-2023 Time in AT/AF  0.0 hr/day (0.0%)    Transmission reviewed.    Optivol Thoracic impedance suggesting possible fluid accumulation starting 7/25 and returned closed to baseline (Torsemide dosage increased 8/2).     Prescribed:  Torsemide 20 mg 4 tablets (80 mg total) by mouth every morning and2 tablets (40 mg total) every evening  Spironolactone 25 mg take 1 tablet daily.   Labs: 04/20/2023 Creatinine 1.59, BUN 27, Potassium 4.2, Sodium 140, GFR 49  A complete set of results can be found in Results Review.   Recommendations:   No changes.   Follow-up plan: ICM clinic phone appointment on 07/02/2023.   91 day device clinic remote transmission 08/22/2023.     EP/Cardiology Office Visits:   Recall 04/14/2024 with Dr Graciela Husbands.   Recall 12/22/2022 with Dr [name].   Copy of ICM check sent to Dr. Graciela Husbands.    3 month ICM trend: 05/28/2023.    12-14 Month ICM trend:     [name], RN 05/30/2023 2:41 PM

## 2023-06-01 ENCOUNTER — Telehealth: Payer: Self-pay

## 2023-06-01 MED ORDER — AMIODARONE HCL 200 MG PO TABS: 200.0000 mg | ORAL_TABLET | Freq: Two times a day (BID) | ORAL | 3 refills | Status: DC

## 2023-06-01 NOTE — Telephone Encounter (Signed)
Per Andy's recommendations ~  Amiodarone increased to 200 mg BID, stat labs (CMET, Mg, TSH, Free T4) and OV Monday 06/04/23 @ 11:00 AM. Pt to go to ER if he starts to feel bad or has any further shocks.   Patient called advised of the following above. Patient states he currently out of town and unable to make it in for lab work. He has enough Amiodarone on hand to take twice a day. Patient advised to call if he has further questions or concerns.

## 2023-06-01 NOTE — Telephone Encounter (Signed)
Following alert received from [employer] received for VF with successful HV therapy. Event occurrred 9/12 @ 15:20, 21sec in duration, mean HR 214 falling into the VF zone.  EGM show sustained VT unchanged with one burst of ATP, HV therapy delivered 35J converting arrhythmia. There are an additional 5 events for VT in the monitor zone, HR's 147-150.  Patient reports he was riding his lawnmower, felt lightheaded then felt shock. Denies chest pain and shortness of breath.Reports compliance with Amiodarone 200 mg daily, sotalol 80 mg BID, Xarelto 20 mg daily, Entresto 49-51 mg BID, Spirolactone 25 mg daily and torsemide 60 mg in AM and 40 mg in PM. Pt educated on shock plan w/ verbal understanding. Advised of [place] DMV driving restrictions x6 months with a start date of 05/31/23, pt voiced understanding.   Secure chat sent to PA in hospital who is working alongside Dr. Graciela Husbands. Pt advised someone will f/u once his chart is reviewed.

## 2023-06-04 ENCOUNTER — Ambulatory Visit: Payer: Medicare HMO | Attending: Student | Admitting: Student

## 2023-06-04 ENCOUNTER — Encounter: Payer: Self-pay | Admitting: Student

## 2023-06-04 VITALS — BP 102/60 | HR 75 | Ht 77.0 in | Wt 324.0 lb

## 2023-06-04 DIAGNOSIS — I4729 Other ventricular tachycardia: Secondary | ICD-10-CM | POA: Diagnosis not present

## 2023-06-04 DIAGNOSIS — Z79899 Other long term (current) drug therapy: Secondary | ICD-10-CM | POA: Diagnosis not present

## 2023-06-04 DIAGNOSIS — Z95810 Presence of automatic (implantable) cardiac defibrillator: Secondary | ICD-10-CM

## 2023-06-04 DIAGNOSIS — I5022 Chronic systolic (congestive) heart failure: Secondary | ICD-10-CM

## 2023-06-04 LAB — CUP PACEART INCLINIC DEVICE CHECK
Battery Remaining Longevity: 85 mo
Battery Voltage: 2.96 V
Brady Statistic AP VP Percent: 50.68 %
Brady Statistic AP VS Percent: 3.9 %
Brady Statistic AS VP Percent: 44.93 %
Brady Statistic AS VS Percent: 0.49 %
Brady Statistic RA Percent Paced: 54.35 %
Brady Statistic RV Percent Paced: 95.24 %
Date Time Interrogation Session: 20240916134706
HighPow Impedance: 64 Ohm
Implantable Lead Connection Status: 753985
Implantable Lead Connection Status: 753985
Implantable Lead Connection Status: 753985
Implantable Lead Implant Date: 20160727
Implantable Lead Implant Date: 20160727
Implantable Lead Implant Date: 20160727
Implantable Lead Location: 753858
Implantable Lead Location: 753859
Implantable Lead Location: 753860
Implantable Lead Model: 4598
Implantable Lead Model: 5076
Implantable Lead Model: 6935
Implantable Pulse Generator Implant Date: 20230605
Lead Channel Impedance Value: 175.622
Lead Channel Impedance Value: 184.154
Lead Channel Impedance Value: 189.525
Lead Channel Impedance Value: 189.525
Lead Channel Impedance Value: 199.5 Ohm
Lead Channel Impedance Value: 342 Ohm
Lead Channel Impedance Value: 361 Ohm
Lead Channel Impedance Value: 399 Ohm
Lead Channel Impedance Value: 399 Ohm
Lead Channel Impedance Value: 418 Ohm
Lead Channel Impedance Value: 456 Ohm
Lead Channel Impedance Value: 456 Ohm
Lead Channel Impedance Value: 475 Ohm
Lead Channel Impedance Value: 608 Ohm
Lead Channel Impedance Value: 646 Ohm
Lead Channel Impedance Value: 665 Ohm
Lead Channel Impedance Value: 665 Ohm
Lead Channel Impedance Value: 665 Ohm
Lead Channel Pacing Threshold Amplitude: 0.5 V
Lead Channel Pacing Threshold Amplitude: 0.875 V
Lead Channel Pacing Threshold Amplitude: 1.5 V
Lead Channel Pacing Threshold Pulse Width: 0.4 ms
Lead Channel Pacing Threshold Pulse Width: 0.4 ms
Lead Channel Pacing Threshold Pulse Width: 0.4 ms
Lead Channel Sensing Intrinsic Amplitude: 1.375 mV
Lead Channel Sensing Intrinsic Amplitude: 3.5 mV
Lead Channel Sensing Intrinsic Amplitude: 4.125 mV
Lead Channel Sensing Intrinsic Amplitude: 5.25 mV
Lead Channel Setting Pacing Amplitude: 1.5 V
Lead Channel Setting Pacing Amplitude: 2 V
Lead Channel Setting Pacing Amplitude: 2 V
Lead Channel Setting Pacing Pulse Width: 0.4 ms
Lead Channel Setting Pacing Pulse Width: 0.4 ms
Lead Channel Setting Sensing Sensitivity: 0.3 mV
Zone Setting Status: 755011

## 2023-06-04 NOTE — Progress Notes (Unsigned)
Electrophysiology Office Note:   ID:  Cohan Youngerman, DOB 1961/05/14, MRN [id number]  Primary Cardiologist: Marca Ancona, MD Electrophysiologist: Sherryl Manges, MD *** {Click to update primary MD,subspecialty MD or APP then REFRESH:1}    History of Present Illness:   Taff Leet is a 62 y.o. male with h/o *** seen today for {VISITTYPE:28148}  Review of systems complete and found to be negative unless listed in HPI.   EP Information / Studies Reviewed:    {EKGtoday:28818}       ICD Interrogation-  reviewed in detail today,  See PACEART report.  Device History: {INDUSTRY:28136} {Blank single:19197::"Dual Chamber","Single Chamber","BiV","S-ICD"} ICD implanted *** for *** History of appropriate therapy: {yes/no:20286} History of AAD therapy: {Blank single:19197::"No","Yes; currently on ***","Yes; previously tolerated ***"}   Physical Exam:   VS:  BP 102/60   Pulse 75   Ht 6\' 5"  (1.956 m)   Wt (!) 324 lb (147 kg)   SpO2 95%   BMI 38.42 kg/m    Wt Readings from Last 3 Encounters:  06/04/23 (!) 324 lb (147 kg)  04/20/23 (!) 329 lb (149.2 kg)  01/30/23 (!) 326 lb 3.2 oz (148 kg)     GEN: Well nourished, well developed in no acute distress NECK: No JVD; No carotid bruits CARDIAC: {EPRHYTHM:28826}, no murmurs, rubs, gallops RESPIRATORY:  Clear to auscultation without rales, wheezing or rhonchi  ABDOMEN: Soft, non-tender, non-distended EXTREMITIES:  No edema; No deformity   ASSESSMENT AND PLAN:    Chronic systolic dysfunction s/p {INDUSTRY:28136} {Blank single:19197::"***","single chamber ICD","dual chamber ICD","CRT-D","S-ICD"}  euvolemic today Stable on an appropriate medical regimen Normal ICD function See Pace Art report No changes today  Disposition:   Follow up with {EPPROVIDERS:28135} {EPFOLLOW UP:28173}   Signed, Graciella Freer, PA-C

## 2023-06-04 NOTE — Patient Instructions (Signed)
Medication Instructions:  Your physician recommends that you continue on your current medications as directed. Please refer to the Current Medication list given to you today.  *If you need a refill on your cardiac medications before your next appointment, please call your pharmacy*  Lab Work: CBC, CMET, TSH, FreeT4-TODAY If you have labs (blood work) drawn today and your tests are completely normal, you will receive your results only by: MyChart Message (if you have MyChart) OR A paper copy in the mail If you have any lab test that is abnormal or we need to change your treatment, we will call you to review the results.  Testing/Procedures: Your physician has requested that you have an echocardiogram. Echocardiography is a painless test that uses sound waves to create images of your heart. It provides your doctor with information about the size and shape of your heart and how well your heart's chambers and valves are working. This procedure takes approximately one hour. There are no restrictions for this procedure. Please do NOT wear cologne, perfume, aftershave, or lotions (deodorant is allowed). Please arrive 15 minutes prior to your appointment time.   Cardiac/Peripheral Catheterization   You are scheduled for a Cardiac Catheterization on Wednesday, September 18 with Dr. Marca Ancona.  1. Please arrive at the [facility] (Main Entrance A) at [facility]: [address] [place], [place] [address] at 5:30 AM (This time is 2 hour(s) before your procedure to ensure your preparation). Free valet parking service is available. You will check in at ADMITTING. The support person will be asked to wait in the waiting room.  It is OK to have someone drop you off and come back when you are ready to be discharged.        Special note: Every effort is made to have your procedure done on time. Please understand that emergencies sometimes delay scheduled procedures.  2. Diet: Do not eat solid  foods after midnight.  You may have clear liquids until 5 AM the day of the procedure.  3. Labs: You will need to have blood drawn on TODAY  4. Medication instructions in preparation for your procedure:   Stop taking Xarelto (Rivaroxaban) the day before and day of Do not take Torsemide or Spironolactone on the morning of   Contrast Allergy: No  On the morning of your procedure, take Aspirin 81 mg and any morning medicines NOT listed above.  You may use sips of water.  5. Plan to go home the same day, you will only stay overnight if medically necessary. 6. You MUST have a responsible adult to drive you home. 7. An adult MUST be with you the first 24 hours after you arrive home. 8. Bring a current list of your medications, and the last time and date medication taken. 9. Bring ID and current insurance cards. 10.Please wear clothes that are easy to get on and off and wear slip-on shoes.  Thank you for allowing [place] to care for you!   -- [facility]  Follow-Up: At [facility], you and your health needs are our priority.  As part of our continuing mission to provide you with exceptional heart care, we have created designated Provider Care Teams.  These Care Teams include your primary Cardiologist (physician) and Advanced Practice Providers (APPs -  Physician Assistants and Nurse Practitioners) who all work together to provide you with the care you need, when you need it.  Your next appointment:   1 month(s)  Provider:   [facility]" Lanna Poche, PA-C

## 2023-06-04 NOTE — H&P (View-Only) (Signed)
Electrophysiology Office Note:   ID:  [name] [name], DOB 09-05-1961, MRN [id number]  Primary Cardiologist: Marca Ancona, MD Electrophysiologist: Sherryl Manges, MD      History of Present Illness:   [name] [name] is a 62 y.o. male with h/o chronic systolic CHF, dilated NICM, s/p BiV ICD, PAF, ETOH use, tobacco use, OSA on CPAP and HTN seen today for acute visit due to ICD shock.    Patient reports he was his USOH. He was mowing the yard last week when he had sudden onset of dizziness and then an ICD shock. Received our call but was out of town so could not present for labs or visit. Presents today for visit. Denies any worsening symptoms as of late, compliant with medications. No new fatigue or edema.   Review of systems complete and found to be negative unless listed in HPI.   EP Information / Studies Reviewed:    EKG is not ordered today. EKG from 04/20/2023 reviewed which showed AS-BiVB pacing at 54 bpm  with stable QT.        ICD Interrogation-  reviewed in detail today,  See PACEART report.  Device History: Medtronic BiV ICD implanted 2016, gen change 02/2022 for CHF History of appropriate therapy: Yes History of AAD therapy: Yes; currently on amiodarone and sotalol    Physical Exam:   VS:  BP 102/60   Pulse 75   Ht 6\' 5"  (1.956 m)   Wt (!) 324 lb (147 kg)   SpO2 95%   BMI 38.42 kg/m    Wt Readings from Last 3 Encounters:  06/04/23 (!) 324 lb (147 kg)  04/20/23 (!) 329 lb (149.2 kg)  01/30/23 (!) 326 lb 3.2 oz (148 kg)     GEN: Well nourished, well developed in no acute distress NECK: No JVD; No carotid bruits CARDIAC: Regular rate and rhythm, no murmurs, rubs, gallops RESPIRATORY:  Clear to auscultation without rales, wheezing or rhonchi  ABDOMEN: Soft, non-tender, non-distended EXTREMITIES:  No edema; No deformity   ASSESSMENT AND PLAN:     Nonischemic cardiomyopathy   Congestive heart failure-chronic-systolic   Ventricular tachycardia-polymorphic   Implantable  defibrillator-CRT-Medtronic   PVCs   Atrial fibrillation-paroxysmal   Amiodarone therapy for the above   Sinus node dysfunction with chronotropic incompetence   Carpal tunnel  Last Echo 08/2021 LVEF 20-25%  -> Update PYP scan not suggestive of TTR cardiac amyloidosis by HF review.  euvolemic today Stable on an appropriate medical regimen Normal ICD function See Pace Art report No changes today  With VT recurrence after long period of quiescence will undergo L/RHC. Last cath 2016 showed only non-obstructive CAD  Labs today.  Increase amiodarone to 200 mg BID for now Continue sotalol.  Disposition:   Follow up with EP APP  post cath and echo.     Signed, Graciella Freer, PA-C

## 2023-06-05 ENCOUNTER — Telehealth: Payer: Self-pay | Admitting: *Deleted

## 2023-06-05 LAB — T4, FREE: Free T4: 1.58 ng/dL (ref 0.82–1.77)

## 2023-06-05 LAB — COMPREHENSIVE METABOLIC PANEL
ALT: 28 IU/L (ref 0–44)
AST: 18 IU/L (ref 0–40)
Albumin: 4.4 g/dL (ref 3.8–4.9)
Alkaline Phosphatase: 88 IU/L (ref 44–121)
BUN/Creatinine Ratio: 24 (ref 10–24)
BUN: 43 mg/dL — ABNORMAL HIGH (ref 8–27)
Bilirubin Total: 0.7 mg/dL (ref 0.0–1.2)
CO2: 29 mmol/L (ref 20–29)
Calcium: 9.5 mg/dL (ref 8.6–10.2)
Chloride: 100 mmol/L (ref 96–106)
Creatinine, Ser: 1.78 mg/dL — ABNORMAL HIGH (ref 0.76–1.27)
Globulin, Total: 2.2 g/dL (ref 1.5–4.5)
Glucose: 99 mg/dL (ref 70–99)
Potassium: 4.5 mmol/L (ref 3.5–5.2)
Sodium: 141 mmol/L (ref 134–144)
Total Protein: 6.6 g/dL (ref 6.0–8.5)
eGFR: 43 mL/min/{1.73_m2} — ABNORMAL LOW (ref >59)

## 2023-06-05 LAB — CBC
Hematocrit: 33.4 % — ABNORMAL LOW (ref 37.5–51.0)
Hemoglobin: 11.5 g/dL — ABNORMAL LOW (ref 13.0–17.7)
MCH: 32.8 pg (ref 26.6–33.0)
MCHC: 34.4 g/dL (ref 31.5–35.7)
MCV: 95 fL (ref 79–97)
Platelets: 167 10*3/uL (ref 150–450)
RBC: 3.51 x10E6/uL — ABNORMAL LOW (ref 4.14–5.80)
RDW: 12.5 % (ref 11.6–15.4)
WBC: 4.3 10*3/uL (ref 3.4–10.8)

## 2023-06-05 LAB — TSH: TSH: 1.09 u[IU]/mL (ref 0.450–4.500)

## 2023-06-05 NOTE — Progress Notes (Signed)
Remote ICD transmission.   

## 2023-06-05 NOTE — Telephone Encounter (Addendum)
Per Dr Shirlee Latch: -no pre-procedure hydration/IV flow rate 50 cc/hr on arrival -okay to hold Spironolactone/Torsemide/Entresto day before and day of procedure-(pt will also hold NSAIDs day before and day of procedure).

## 2023-06-05 NOTE — Telephone Encounter (Signed)
Cardiac Catheterization scheduled at [facility] for: Thursday June 07, 2023 7:30 AM Arrival time [facility] Main Entrance A at: 5:30 AM  Nothing to eat after midnight prior to procedure, clear liquids until 5 AM day of procedure.  Medication instructions: -Hold:  Xarelto-none 06/05/23 until post procedure  Entresto/Spironolactone/Torsemide/NSAIDs-day before and day of procedure -per protocol GFR 43 -Other usual morning medications can be taken with sips of water including aspirin 81 mg.  Plan to go home the same day, you will only stay overnight if medically necessary.  You must have responsible adult to drive you home.  Someone must be with you the first 24 hours after you arrive home.  Reviewed procedure instructions with patient.

## 2023-06-06 ENCOUNTER — Telehealth (HOSPITAL_COMMUNITY): Payer: Self-pay | Admitting: Cardiology

## 2023-06-06 NOTE — Telephone Encounter (Signed)
Pt left VM on triage line reporting second device shock today . Aware he is scheduled for cath Friday however wanted to confirm if additional changes are needed

## 2023-06-06 NOTE — Telephone Encounter (Signed)
Patient received 1 shock from ICD and had 1 VT-Monitor zone event logged. Patient called who reports he was sitting in chair, felt lightheaded and received shock. Patient denies any symptoms at this time and reports "feeling fine." Spoke to Otilio Saber, [place] who advised if patient receives any further shocks to go to ER. Patient made aware and voiced understanding. Pt also advised [place] DMV driving restrictions x6 months with start date 06/06/23 w/ verbal understanding.

## 2023-06-06 NOTE — Telephone Encounter (Signed)
His cath is tomorrow not Friday, make sure he knows that.

## 2023-06-06 NOTE — Telephone Encounter (Signed)
Pt aware of procedure 9/19 arrival @0530 

## 2023-06-07 ENCOUNTER — Encounter (HOSPITAL_COMMUNITY)
Admission: RE | Disposition: A | Discharge status: Home / Self Care | Payer: Self-pay | Source: Home / Self Care | Attending: Cardiology

## 2023-06-07 ENCOUNTER — Observation Stay (HOSPITAL_COMMUNITY): Payer: Medicare HMO

## 2023-06-07 ENCOUNTER — Encounter (HOSPITAL_COMMUNITY): Payer: Self-pay | Admitting: Cardiology

## 2023-06-07 ENCOUNTER — Other Ambulatory Visit: Payer: Self-pay

## 2023-06-07 ENCOUNTER — Observation Stay (HOSPITAL_COMMUNITY)
Admission: RE | Admit: 2023-06-07 | Discharge: 2023-06-08 | Disposition: A | Discharge status: Home / Self Care | Payer: Medicare HMO | Attending: Cardiology | Admitting: Cardiology

## 2023-06-07 DIAGNOSIS — I428 Other cardiomyopathies: Secondary | ICD-10-CM | POA: Diagnosis not present

## 2023-06-07 DIAGNOSIS — Z95810 Presence of automatic (implantable) cardiac defibrillator: Secondary | ICD-10-CM | POA: Diagnosis not present

## 2023-06-07 DIAGNOSIS — Z23 Encounter for immunization: Secondary | ICD-10-CM | POA: Diagnosis not present

## 2023-06-07 DIAGNOSIS — I472 Ventricular tachycardia, unspecified: Principal | ICD-10-CM | POA: Insufficient documentation

## 2023-06-07 DIAGNOSIS — Z96641 Presence of right artificial hip joint: Secondary | ICD-10-CM | POA: Diagnosis not present

## 2023-06-07 DIAGNOSIS — Z96653 Presence of artificial knee joint, bilateral: Secondary | ICD-10-CM | POA: Insufficient documentation

## 2023-06-07 DIAGNOSIS — I5022 Chronic systolic (congestive) heart failure: Secondary | ICD-10-CM

## 2023-06-07 DIAGNOSIS — Z87891 Personal history of nicotine dependence: Secondary | ICD-10-CM | POA: Diagnosis not present

## 2023-06-07 DIAGNOSIS — Z79899 Other long term (current) drug therapy: Secondary | ICD-10-CM | POA: Diagnosis not present

## 2023-06-07 DIAGNOSIS — Z7901 Long term (current) use of anticoagulants: Secondary | ICD-10-CM | POA: Diagnosis not present

## 2023-06-07 DIAGNOSIS — I5042 Chronic combined systolic (congestive) and diastolic (congestive) heart failure: Secondary | ICD-10-CM | POA: Insufficient documentation

## 2023-06-07 DIAGNOSIS — I2510 Atherosclerotic heart disease of native coronary artery without angina pectoris: Secondary | ICD-10-CM | POA: Insufficient documentation

## 2023-06-07 DIAGNOSIS — I480 Paroxysmal atrial fibrillation: Secondary | ICD-10-CM | POA: Insufficient documentation

## 2023-06-07 HISTORY — PX: RIGHT/LEFT HEART CATH AND CORONARY ANGIOGRAPHY: CATH118266

## 2023-06-07 LAB — POCT I-STAT EG7
Acid-Base Excess: 2 mmol/L (ref 0.0–2.0)
Acid-Base Excess: 2 mmol/L (ref 0.0–2.0)
Bicarbonate: 27.8 mmol/L (ref 20.0–28.0)
Bicarbonate: 27.5 mmol/L (ref 20.0–28.0)
Calcium, Ion: 1.27 mmol/L (ref 1.15–1.40)
Calcium, Ion: 1.23 mmol/L (ref 1.15–1.40)
HCT: 28 % — ABNORMAL LOW (ref 39.0–52.0)
HCT: 28 % — ABNORMAL LOW (ref 39.0–52.0)
Hemoglobin: 9.5 g/dL — ABNORMAL LOW (ref 13.0–17.0)
Hemoglobin: 9.5 g/dL — ABNORMAL LOW (ref 13.0–17.0)
O2 Saturation: 63 %
O2 Saturation: 60 %
Potassium: 3.9 mmol/L (ref 3.5–5.1)
Potassium: 3.8 mmol/L (ref 3.5–5.1)
Sodium: 142 mmol/L (ref 135–145)
Sodium: 142 mmol/L (ref 135–145)
TCO2: 29 mmol/L (ref 22–32)
TCO2: 29 mmol/L (ref 22–32)
pCO2, Ven: 45.3 mmHg (ref 44–60)
pCO2, Ven: 45 mmHg (ref 44–60)
pH, Ven: 7.395 (ref 7.25–7.43)
pH, Ven: 7.393 (ref 7.25–7.43)
pO2, Ven: 33 mmHg (ref 32–45)
pO2, Ven: 32 mmHg (ref 32–45)

## 2023-06-07 LAB — CBC WITH DIFFERENTIAL/PLATELET
Abs Immature Granulocytes: 0.01 10*3/uL (ref 0.00–0.07)
Basophils Absolute: 0 10*3/uL (ref 0.0–0.1)
Basophils Relative: 1 %
Eosinophils Absolute: 0.2 10*3/uL (ref 0.0–0.5)
Eosinophils Relative: 4 %
HCT: 31.4 % — ABNORMAL LOW (ref 39.0–52.0)
Hemoglobin: 11 g/dL — ABNORMAL LOW (ref 13.0–17.0)
Immature Granulocytes: 0 %
Lymphocytes Relative: 21 %
Lymphs Abs: 0.9 10*3/uL (ref 0.7–4.0)
MCH: 34.3 pg — ABNORMAL HIGH (ref 26.0–34.0)
MCHC: 35 g/dL (ref 30.0–36.0)
MCV: 97.8 fL (ref 80.0–100.0)
Monocytes Absolute: 0.3 10*3/uL (ref 0.1–1.0)
Monocytes Relative: 7 %
Neutro Abs: 2.7 10*3/uL (ref 1.7–7.7)
Neutrophils Relative %: 67 %
Platelets: 144 10*3/uL — ABNORMAL LOW (ref 150–400)
RBC: 3.21 MIL/uL — ABNORMAL LOW (ref 4.22–5.81)
RDW: 13.2 % (ref 11.5–15.5)
WBC: 4 10*3/uL (ref 4.0–10.5)
nRBC: 0 % (ref 0.0–0.2)

## 2023-06-07 LAB — COMPREHENSIVE METABOLIC PANEL
ALT: 29 U/L (ref 0–44)
AST: 22 U/L (ref 15–41)
Albumin: 3.8 g/dL (ref 3.5–5.0)
Alkaline Phosphatase: 62 U/L (ref 38–126)
Anion gap: 8 (ref 5–15)
BUN: 22 mg/dL — ABNORMAL HIGH (ref 6–20)
CO2: 26 mmol/L (ref 22–32)
Calcium: 9.1 mg/dL (ref 8.9–10.3)
Chloride: 106 mmol/L (ref 98–111)
Creatinine, Ser: 1.6 mg/dL — ABNORMAL HIGH (ref 0.61–1.24)
GFR, Estimated: 49 mL/min — ABNORMAL LOW (ref >60)
Glucose, Bld: 144 mg/dL — ABNORMAL HIGH (ref 70–99)
Potassium: 3.5 mmol/L (ref 3.5–5.1)
Sodium: 140 mmol/L (ref 135–145)
Total Bilirubin: 1 mg/dL (ref 0.3–1.2)
Total Protein: 6.4 g/dL — ABNORMAL LOW (ref 6.5–8.1)

## 2023-06-07 LAB — BRAIN NATRIURETIC PEPTIDE: B Natriuretic Peptide: 312.6 pg/mL — ABNORMAL HIGH (ref 0.0–100.0)

## 2023-06-07 LAB — TSH: TSH: 1.137 u[IU]/mL (ref 0.350–4.500)

## 2023-06-07 LAB — HIV ANTIBODY (ROUTINE TESTING W REFLEX): HIV Screen 4th Generation wRfx: NONREACTIVE

## 2023-06-07 LAB — MAGNESIUM: Magnesium: 2.1 mg/dL (ref 1.7–2.4)

## 2023-06-07 SURGERY — RIGHT/LEFT HEART CATH AND CORONARY ANGIOGRAPHY
Anesthesia: LOCAL

## 2023-06-07 MED ORDER — AMIODARONE HCL IN DEXTROSE 360-4.14 MG/200ML-% IV SOLN
60.0000 mg/h | INTRAVENOUS | Status: AC
  Administered 2023-06-07: 60 mg/h via INTRAVENOUS
  Filled 2023-06-07: qty 200

## 2023-06-07 MED ORDER — SODIUM CHLORIDE 0.9 % IV SOLN: 250.0000 mL | INTRAVENOUS | Status: DC | PRN

## 2023-06-07 MED ORDER — SODIUM CHLORIDE 0.9% FLUSH
3.0000 mL | Freq: Two times a day (BID) | INTRAVENOUS | Status: DC
  Administered 2023-06-07 (×2): 3 mL via INTRAVENOUS

## 2023-06-07 MED ORDER — HEPARIN SODIUM (PORCINE) 1000 UNIT/ML IJ SOLN
INTRAMUSCULAR | Status: AC
  Filled 2023-06-07: qty 10

## 2023-06-07 MED ORDER — IOHEXOL 350 MG/ML SOLN
INTRAVENOUS | Status: DC | PRN
  Administered 2023-06-07: 70 mL

## 2023-06-07 MED ORDER — TORSEMIDE 20 MG PO TABS
40.0000 mg | ORAL_TABLET | Freq: Every day | ORAL | Status: DC
  Administered 2023-06-07: 40 mg via ORAL
  Filled 2023-06-07 (×2): qty 2

## 2023-06-07 MED ORDER — FENTANYL CITRATE (PF) 100 MCG/2ML IJ SOLN
INTRAMUSCULAR | Status: AC
  Filled 2023-06-07: qty 2

## 2023-06-07 MED ORDER — SPIRONOLACTONE 25 MG PO TABS
25.0000 mg | ORAL_TABLET | Freq: Every day | ORAL | Status: DC
  Administered 2023-06-07 – 2023-06-08 (×2): 25 mg via ORAL
  Filled 2023-06-07 (×2): qty 1

## 2023-06-07 MED ORDER — FENTANYL CITRATE (PF) 100 MCG/2ML IJ SOLN
INTRAMUSCULAR | Status: DC | PRN
  Administered 2023-06-07: 12.5 ug via INTRAVENOUS
  Administered 2023-06-07: 25 ug via INTRAVENOUS

## 2023-06-07 MED ORDER — SOTALOL HCL 80 MG PO TABS
80.0000 mg | ORAL_TABLET | Freq: Two times a day (BID) | ORAL | Status: DC
  Administered 2023-06-07 – 2023-06-08 (×3): 80 mg via ORAL
  Filled 2023-06-07 (×4): qty 1

## 2023-06-07 MED ORDER — SODIUM CHLORIDE 0.9 % IV SOLN: INTRAVENOUS | Status: DC

## 2023-06-07 MED ORDER — TORSEMIDE 20 MG PO TABS
60.0000 mg | ORAL_TABLET | Freq: Every day | ORAL | Status: DC
  Administered 2023-06-07 – 2023-06-08 (×2): 60 mg via ORAL
  Filled 2023-06-07 (×2): qty 3

## 2023-06-07 MED ORDER — MIDAZOLAM HCL 2 MG/2ML IJ SOLN
INTRAMUSCULAR | Status: AC
  Filled 2023-06-07: qty 2

## 2023-06-07 MED ORDER — SODIUM CHLORIDE 0.9% FLUSH: 3.0000 mL | INTRAVENOUS | Status: DC | PRN

## 2023-06-07 MED ORDER — INFLUENZA VIRUS VACC SPLIT PF (FLUZONE) 0.5 ML IM SUSY
0.5000 mL | PREFILLED_SYRINGE | INTRAMUSCULAR | Status: AC
  Administered 2023-06-08: 0.5 mL via INTRAMUSCULAR
  Filled 2023-06-07: qty 0.5

## 2023-06-07 MED ORDER — ADULT MULTIVITAMIN W/MINERALS CH
1.0000 | ORAL_TABLET | Freq: Every day | ORAL | Status: DC
  Administered 2023-06-08: 1 via ORAL
  Filled 2023-06-07: qty 1

## 2023-06-07 MED ORDER — ONDANSETRON HCL 4 MG/2ML IJ SOLN: 4.0000 mg | Freq: Four times a day (QID) | INTRAMUSCULAR | Status: DC | PRN

## 2023-06-07 MED ORDER — LIDOCAINE HCL (PF) 1 % IJ SOLN
INTRAMUSCULAR | Status: AC
  Filled 2023-06-07: qty 30

## 2023-06-07 MED ORDER — RIVAROXABAN 20 MG PO TABS
20.0000 mg | ORAL_TABLET | Freq: Every day | ORAL | Status: DC
  Administered 2023-06-07: 20 mg via ORAL
  Filled 2023-06-07: qty 1

## 2023-06-07 MED ORDER — SODIUM CHLORIDE 0.9 % IV SOLN: INTRAVENOUS | Status: AC

## 2023-06-07 MED ORDER — SACUBITRIL-VALSARTAN 49-51 MG PO TABS
1.0000 | ORAL_TABLET | Freq: Two times a day (BID) | ORAL | Status: DC
  Administered 2023-06-07 – 2023-06-08 (×3): 1 via ORAL
  Filled 2023-06-07 (×3): qty 1

## 2023-06-07 MED ORDER — HYDRALAZINE HCL 20 MG/ML IJ SOLN: 10.0000 mg | INTRAMUSCULAR | Status: AC | PRN

## 2023-06-07 MED ORDER — ACETAMINOPHEN 325 MG PO TABS
650.0000 mg | ORAL_TABLET | ORAL | Status: DC | PRN
  Administered 2023-06-08 (×2): 650 mg via ORAL
  Filled 2023-06-07 (×2): qty 2

## 2023-06-07 MED ORDER — HEPARIN (PORCINE) IN NACL 1000-0.9 UT/500ML-% IV SOLN
INTRAVENOUS | Status: DC | PRN
  Administered 2023-06-07 (×2): 500 mL

## 2023-06-07 MED ORDER — LABETALOL HCL 5 MG/ML IV SOLN: 10.0000 mg | INTRAVENOUS | Status: AC | PRN

## 2023-06-07 MED ORDER — AMIODARONE HCL IN DEXTROSE 360-4.14 MG/200ML-% IV SOLN
30.0000 mg/h | INTRAVENOUS | Status: DC
  Administered 2023-06-08: 30 mg/h via INTRAVENOUS
  Filled 2023-06-07 (×2): qty 200

## 2023-06-07 MED ORDER — TORSEMIDE 20 MG PO TABS: 40.0000 mg | ORAL_TABLET | Freq: Two times a day (BID) | ORAL | Status: DC

## 2023-06-07 MED ORDER — ATORVASTATIN CALCIUM 80 MG PO TABS
80.0000 mg | ORAL_TABLET | Freq: Every evening | ORAL | Status: DC
  Administered 2023-06-07: 80 mg via ORAL
  Filled 2023-06-07: qty 1

## 2023-06-07 MED ORDER — MIDAZOLAM HCL 2 MG/2ML IJ SOLN
INTRAMUSCULAR | Status: DC | PRN
  Administered 2023-06-07: 1 mg via INTRAVENOUS
  Administered 2023-06-07: .5 mg via INTRAVENOUS

## 2023-06-07 MED ORDER — SODIUM CHLORIDE 0.9% FLUSH: 3.0000 mL | Freq: Two times a day (BID) | INTRAVENOUS | Status: DC

## 2023-06-07 MED ORDER — LIDOCAINE HCL (PF) 1 % IJ SOLN
INTRAMUSCULAR | Status: DC | PRN
  Administered 2023-06-07: 7 mL

## 2023-06-07 MED ORDER — ALBUTEROL SULFATE (2.5 MG/3ML) 0.083% IN NEBU: 3.0000 mL | INHALATION_SOLUTION | Freq: Four times a day (QID) | RESPIRATORY_TRACT | Status: DC | PRN

## 2023-06-07 MED ORDER — ASPIRIN 81 MG PO CHEW: 81.0000 mg | CHEWABLE_TABLET | ORAL | Status: DC

## 2023-06-07 MED ORDER — VERAPAMIL HCL 2.5 MG/ML IV SOLN
INTRAVENOUS | Status: DC | PRN
  Administered 2023-06-07: 10 mL via INTRA_ARTERIAL

## 2023-06-07 MED ORDER — ASPIRIN 81 MG PO CHEW: 81.0000 mg | CHEWABLE_TABLET | ORAL | Status: AC

## 2023-06-07 MED ORDER — ACETAMINOPHEN 325 MG PO TABS: 650.0000 mg | ORAL_TABLET | ORAL | Status: DC | PRN

## 2023-06-07 MED ORDER — AMIODARONE LOAD VIA INFUSION
150.0000 mg | Freq: Once | INTRAVENOUS | Status: AC
  Administered 2023-06-07: 150 mg via INTRAVENOUS
  Filled 2023-06-07: qty 83.34

## 2023-06-07 MED ORDER — VERAPAMIL HCL 2.5 MG/ML IV SOLN
INTRAVENOUS | Status: AC
  Filled 2023-06-07: qty 2

## 2023-06-07 MED ORDER — HEPARIN SODIUM (PORCINE) 1000 UNIT/ML IJ SOLN
INTRAMUSCULAR | Status: DC | PRN
  Administered 2023-06-07: 6000 [IU] via INTRAVENOUS

## 2023-06-07 MED ORDER — AMIODARONE HCL 200 MG PO TABS: 200.0000 mg | ORAL_TABLET | Freq: Two times a day (BID) | ORAL | Status: DC

## 2023-06-07 SURGICAL SUPPLY — 11 items
CATH 5FR JL3.5 JR4 ANG PIG MP (CATHETERS) IMPLANT
CATH BALLN WEDGE 5F 110CM (CATHETERS) IMPLANT
DEVICE RAD COMP TR BAND LRG (VASCULAR PRODUCTS) IMPLANT
ELECT DEFIB PAD ADLT CADENCE (PAD) IMPLANT
GLIDESHEATH SLEND SS 6F .021 (SHEATH) IMPLANT
GUIDEWIRE INQWIRE 1.5J.035X260 (WIRE) IMPLANT
INQWIRE 1.5J .035X260CM (WIRE) ×1
KIT SYRINGE INJ CVI SPIKEX1 (MISCELLANEOUS) IMPLANT
PACK CARDIAC CATHETERIZATION (CUSTOM PROCEDURE TRAY) ×1 IMPLANT
SET ATX-X65L (MISCELLANEOUS) IMPLANT
SHEATH GLIDE SLENDER 4/5FR (SHEATH) IMPLANT

## 2023-06-07 NOTE — Progress Notes (Signed)
TR BAND REMOVAL  LOCATION:    radial  rt radial arterial site  DEFLATED PER PROTOCOL:   yes  TIME BAND OFF / DRESSING APPLIED:    1125/gauze and tegaderm  SITE UPON ARRIVAL:    Level 0  SITE AFTER BAND REMOVAL:    Level 0  CIRCULATION SENSATION AND MOVEMENT:    Within Normal Limits :  rt radial pulse palpable, rt hand and fingers warm and pink, sensation present, good pleath waveform  COMMENTS:

## 2023-06-07 NOTE — H&P (Signed)
Advanced Heart Failure Team History and Physical Note   PCP:  [name], NP  PCP-Cardiology: Marca Ancona, MD     Reason for Admission: VT   HPI:    62 y.o.with a past medical history of chronic systolic CHF  (EF 15% in April 2018), dilated NICM s/p BiV ICD, PAF with intermittent compliance with Xarelto, ETOH abuse, tobacco abuse, OSA on CPAP, VT, and HTN.    Admitted 12/25/16-12/27/16 with acute on chronic systolic CHF, volume overload. He was diuresed 11 pounds on IV lasix.  Discharge weight was 294 pounds. It was felt that the etiology of his NICM was ETOH vs hypertension related.    Admitted 5/14-5/16/18 with volume overload, acute respiratory failure requiring Bipap. He was not taking his Entresto as he was confused about dosing and there was a mix up his pharmacy. He was diuresed with IV lasix, discharge weight was 295 pounds.    Admitted 05/30/17-06/02/17 with VT, ICD shock x 4. He is now on amiodarone and sotalol (to lower DFT)   Echo in 12/20 showed EF 20-25%, severe LV dilation, moderate LVH, normal RV, small IVC.    He was admitted in 3/21 with ICD shock in setting of hypokalemia and heavy ETOH. Entresto was decreased with low BP.  He was diuresed due to volume overload.    He had left TKR in 11/21 with no complication.    Echo in 12/21 showed EF 20-25%, moderate LVH, normal RV, normal IVC.    Echo in 12/22 showed EF 20-25%, diffuse hypokinesis, mildly decreased RV systolic function.   Most recently, he had been feeling well with no complaint of dyspnea or chest pain.  However, he had VT on 9/13 terminated by ICD shock and again on 9/18 after a long period of quiescence.  He was brought in for [facility] today, showing well-compensated CHF and mild nonobstructive CAD.   Review of Systems:All systems reviewed and negative except as per HPI.    Home Medications Prior to Admission medications   Medication Sig Start Date End Date Taking? Authorizing Provider  albuterol  (VENTOLIN HFA) 108 (90 Base) MCG/ACT inhaler Inhale 1 puff into the lungs every 6 (six) hours as needed for wheezing or shortness of breath. 12/09/19  Yes [name], MD  amiodarone (PACERONE) 200 MG tablet Take 1 tablet (200 mg total) by mouth 2 (two) times daily. 06/01/23  Yes [name], MD  Ascorbic Acid (VITAMIN C) 100 MG tablet Take 100 mg by mouth daily.   Yes [provider]  atorvastatin (LIPITOR) 80 MG tablet TAKE 1 TABLET EVERY EVENING 04/05/23  Yes [name], MD  ibuprofen (ADVIL) 200 MG tablet Take 400 mg by mouth every 8 (eight) hours as needed for moderate pain.   Yes [provider]  Multiple Vitamins-Minerals (MULTIVITAMIN WITH MINERALS) tablet Take 1 tablet by mouth daily.   Yes [provider]  OVER THE COUNTER MEDICATION Take 1 tablet by mouth daily. Healthy eye care   Yes [provider]  sacubitril-valsartan (ENTRESTO) 49-51 MG Take 1 tablet by mouth 2 (two) times daily. 05/18/22  Yes [name], MD  sotalol (BETAPACE) 80 MG tablet TAKE 1 TABLET EVERY 12 HOURS (NEED FOLLOW UP APPOINTMENT FOR ANYMORE REFILLS) 05/29/23  Yes [name], MD  spironolactone (ALDACTONE) 25 MG tablet TAKE 1 TABLET EVERY DAY 05/29/23  Yes [name], MD  torsemide (DEMADEX) 20 MG tablet TAKE 3 TABLETS EVERY MORNING AND TAKE 2 TABLETS  EVERY EVENING 05/29/23  Yes [name], MD  XARELTO 20 MG TABS tablet TAKE 1 TABLET DAILY WITH SUPPER. 03/14/23  Yes [name], MD    Past Medical History: Past Medical History:  Diagnosis Date   AICD (automatic cardioverter/defibrillator) present    Arthritis    Atrial fibrillation (HCC)    CAD (coronary artery disease)    Mild nonobstructive (Cath 09)   Chronic systolic heart failure (HCC)    Nonischemic CM: echo 4/12 with EF 25-30%, grade 2 diast dysfxn, mild dilated aortic root 43 mm, trivial MR, mod LAE   COPD (chronic obstructive pulmonary disease) (HCC)    Dyspnea    with exertion     Ejection fraction < 50% 08/2019   20-25% noted on ECHO   H/O ETOH abuse    HTN (hypertension)    Hypersomnia    Nonischemic cardiomyopathy (HCC)    Obesity, morbid (HCC)    OSA on CPAP    Systolic and diastolic CHF, acute on chronic (HCC) 11/2016   Tobacco use disorder    Ventricular tachycardia (HCC)     Past Surgical History: Past Surgical History:  Procedure Laterality Date   CARDIAC CATHETERIZATION N/A 04/08/2015   Procedure: Right/Left Heart Cath and Coronary Angiography;  Surgeon: Corky Crafts, MD;  Location: [facility] INVASIVE CV LAB;  Service: Cardiovascular;  Laterality: N/A;   EP IMPLANTABLE DEVICE N/A 04/14/2015   Procedure: BiV ICD Insertion CRT-D;  Surgeon: [name], MD;  Location: [facility] INVASIVE CV LAB;  Service: Cardiovascular;  Laterality: N/A;   HYDROCELE EXCISION / REPAIR     ICD GENERATOR CHANGEOUT N/A 02/20/2022   Procedure: ICD GENERATOR CHANGEOUT;  Surgeon: [name], MD;  Location: [facility] INVASIVE CV LAB;  Service: Cardiovascular;  Laterality: N/A;   ICD IMPLANT     TOTAL HIP ARTHROPLASTY Right 11/04/2018   Procedure: TOTAL HIP ARTHROPLASTY ANTERIOR APPROACH;  Surgeon: [name], MD;  Location: [facility] ORS;  Service: Orthopedics;  Laterality: Right;   TOTAL KNEE ARTHROPLASTY Right 01/30/2020   Procedure: TOTAL KNEE ARTHROPLASTY;  Surgeon: [name], MD;  Location: [facility] ORS;  Service: Orthopedics;  Laterality: Right;   TOTAL KNEE ARTHROPLASTY Left 07/30/2020   Procedure: TOTAL KNEE ARTHROPLASTY;  Surgeon: [name], MD;  Location: [facility] ORS;  Service: Orthopedics;  Laterality: Left;   TUMOR EXCISION Left    TUMOR EXCISION Left 1982   Benign tumor removed from L leg   VASECTOMY      Family History:  Family History  Problem Relation Age of Onset   Other Mother        cardiac surgery. late 1990s   Heart Problems Mother        CABG AGE 33   Congestive Heart Failure Father    Healthy Father        AGE 39   Coronary artery disease Other    Healthy  Brother        AGE 39   Healthy Sister        AGE 78   Healthy Sister        AGE 29   Healthy Son    Healthy Son    Healthy Daughter     Social History: Social History   Socioeconomic History   Marital status: Single    Spouse name: Not on file   Number of children: 2   Years of education: Not on file   Highest education level: Not on file  Occupational History  Occupation: Copy   Occupation: disabled, odd jobs  Tobacco Use   Smoking status: Former    Current packs/day: 0.00    Average packs/day: 0.5 packs/day for 4.0 years (2.0 ttl pk-yrs)    Types: Cigarettes    Start date: 10/29/2001    Quit date: 10/29/2005    Years since quitting: 17.6   Smokeless tobacco: Never   Tobacco comments:    about a pack a week. social  Vaping Use   Vaping status: Never Used  Substance and Sexual Activity   Alcohol use: Yes    Comment: 12 beers per week    Drug use: Not Currently    Types: Marijuana   Sexual activity: Yes    Partners: Female  Other Topics Concern   Not on file  Social History Narrative   ** Merged History Encounter **       Social Determinants of Health   Financial Resource Strain: Low Risk  (05/15/2021)   Overall Financial Resource Strain (CARDIA)    Difficulty of Paying Living Expenses: Not hard at all  Food Insecurity: No Food Insecurity (05/15/2021)   Hunger Vital Sign    Worried About Running Out of Food in the Last Year: Never true    Ran Out of Food in the Last Year: Never true  Transportation Needs: No Transportation Needs (05/15/2021)   PRAPARE - Administrator, Civil Service (Medical): No    Lack of Transportation (Non-Medical): No  Physical Activity: Insufficiently Active (05/15/2021)   Exercise Vital Sign    Days of Exercise per Week: 2 days    Minutes of Exercise per Session: 20 min  Stress: No Stress Concern Present (05/15/2021)   [employer] of Occupational Health - Occupational Stress Questionnaire    Feeling of  Stress : Not at all  Social Connections: Moderately Isolated (05/15/2021)   Social Connection and Isolation Panel [NHANES]    Frequency of Communication with Friends and Family: More than three times a week    Frequency of Social Gatherings with Friends and Family: More than three times a week    Attends Religious Services: 1 to 4 times per year    Active Member of [employer] or Organizations: No    Attends Banker Meetings: Never    Marital Status: Divorced    Allergies:  Allergies  Allergen Reactions   Sglt2 Inhibitors Other (See Comments)    Recurrent UTI    Objective:    Vital Signs:   Temp:  [97.9 F (36.6 C)] 97.9 F (36.6 C) (09/19 0614) Pulse Rate:  [51-64] 51 (09/19 0915) Resp:  [10-20] 20 (09/19 0915) BP: (111-131)/(62-82) 128/68 (09/19 0915) SpO2:  [86 %-99 %] 94 % (09/19 0915) Weight:  [147.4 kg] 147.4 kg (09/19 0614)   Filed Weights   06/07/23 0614  Weight: (!) 147.4 kg     Physical Exam     General:  Well appearing. No respiratory difficulty HEENT: Normal Neck: Supple. no JVD. Carotids 2+ bilat; no bruits. No lymphadenopathy or thyromegaly appreciated. Cor: PMI nondisplaced. Regular rate & rhythm. No rubs, gallops or murmurs. Lungs: Clear Abdomen: Soft, nontender, nondistended. No hepatosplenomegaly. No bruits or masses. Good bowel sounds. Extremities: No cyanosis, clubbing, rash, edema Neuro: Alert & oriented x 3, cranial nerves grossly intact. moves all 4 extremities w/o difficulty. Affect pleasant.   Telemetry   NSR with BiV pacing (personally reviewed)  EKG   NSR, BiV pacing, QTc 511 msec (personally reviewed)  Labs  Basic Metabolic Panel: Recent Labs  Lab 06/04/23 1145 06/07/23 0823  NA 141 142  K 4.5 3.9  CL 100  --   CO2 29  --   GLUCOSE 99  --   BUN 43*  --   CREATININE 1.78*  --   CALCIUM 9.5  --     Liver Function Tests: Recent Labs  Lab 06/04/23 1145  AST 18  ALT 28  ALKPHOS 88  BILITOT 0.7  PROT 6.6   ALBUMIN 4.4   No results for input(s): "LIPASE", "AMYLASE" in the last 168 hours. No results for input(s): "AMMONIA" in the last 168 hours.  CBC: Recent Labs  Lab 06/04/23 1145 06/07/23 0823  WBC 4.3  --   HGB 11.5* 9.5*  HCT 33.4* 28.0*  MCV 95  --   PLT 167  --     Cardiac Enzymes: No results for input(s): "CKTOTAL", "CKMB", "CKMBINDEX", "TROPONINI" in the last 168 hours.  BNP: BNP (last 3 results) No results for input(s): "BNP" in the last 8760 hours.  ProBNP (last 3 results) No results for input(s): "PROBNP" in the last 8760 hours.   CBG: No results for input(s): "GLUCAP" in the last 168 hours.  Coagulation Studies: No results for input(s): "LABPROT", "INR" in the last 72 hours.  Imaging: CARDIAC CATHETERIZATION  Result Date: 06/07/2023   Mid LAD lesion is 30% stenosed. 1. Normal PCWP and LVEDP, minimally elevated RA pressure. 2. Preserved cardiac output. 3. Nonobstructive mild CAD.     Assessment/Plan   1. VT: Has been chronically on amiodarone with addition of sotalol to lower DFT.  QTc 511 msec today (paced).  He has had 2 episodes of VT with ICD shock in the last week, most recent was yesterday.  Cath today (see below) showed optimized filling pressures and preserved cardiac output, minimal CAD.   - I will bring him in for observation overnight and ask EP to see. Will plan to increase his lower pacing rate.  2. Chronic systolic CHF: NICM, Echo 12/2016 EF 15%. Normal coronaries on cath in July 2016. Medtronic CRT-D device. ETOH vs HTN vs myocarditis as cause of cardiomyopathy. Echo 12/22 showed that EF remains 20-25%.  PYP scan not suggestive of TTR cardiac amyloidosis.  RHC/LHC today showed nonobstructive mild CAD, preserved cardiac output, and relatively normal filling pressures.  NYHA class II symptoms,at baseline currently.   - Continue torsemide 60 mg daily alternating with 40 mg every other day. BMET today. - Continue Entresto 49/51 mg bid. - Continue  spironolactone 25 mg daily.  - Intolerant to Coreg in the past with hypotension, he is on sotalol and amiodarone.  - He stopped Jardiance after 3 UTIs, I will not start back.  3. Paroxysmal atrial fibrillation: He is in NSR today.  - Continue amiodarone, check LFTs/TSH today.   - Continue Xarelto 20 mg daily.  CBC today. 4. ETOH abuse: Suspect this contributes to cardiomyopathy.  Still drinking moderately, I have encouraged him to cut back.  5. OSA: Use CPAP.    Marca Ancona, MD 06/07/2023, 9:32 AM  Advanced Heart Failure Team Pager [phone] (M-F; 7a - 5p)  Please contact [facility] for night-coverage after hours (4p -7a ) and weekends on [email or web address]

## 2023-06-07 NOTE — Progress Notes (Signed)
   06/07/23 2226  BiPAP/CPAP/SIPAP  $ Non-Invasive Ventilator  Non-Invasive Vent Set Up  BiPAP/CPAP/SIPAP Pt Type Adult  BiPAP/CPAP/SIPAP DREAMSTATIOND  Mask Type Full face mask  Mask Size Extra large  EPAP 10 cmH2O  FiO2 (%) 28 %  Patient Home Equipment No

## 2023-06-07 NOTE — Plan of Care (Signed)

## 2023-06-07 NOTE — Progress Notes (Signed)
Patient comes today for elective out patient cath. Reached out to by Dr. Shirlee Latch with recent up-tick in VT of late having tgotten ICD therapies. Cath without obstructive disease to explain his VT (which he does have hx of) On sotalol and amiodarone via Dr. Graciela Husbands, QTc appears stable  Recommendation was to increase his base pacing rate to 80bpm with close follow up with dr. Graciela Husbands out patient  I have asked industry to increase his pacing rate, follow up with Dr. Graciela Husbands is in place for 06/18/23.  Francis Dowse, PA-C

## 2023-06-07 NOTE — Progress Notes (Signed)
Heart Failure Navigator Progress Note  Assessed for Heart & Vascular TOC clinic readiness.  Patient does not meet criteria due to Advanced Heart Failure Team patient of Dr. Shirlee Latch.   Navigator will sign off at this time.   Rhae Hammock, BSN, Scientist, clinical (histocompatibility and immunogenetics) Only

## 2023-06-07 NOTE — Significant Event (Signed)
    Called by RN with patient having recurrent episode of VT, review of telemetry shows about 3 min run with break. He complained of light-headedness and dizziness. Anxious as he thought his device was going to shock him. BP 98 systolic currently. No complaints at this time.  -- review of notes (brought in for observation due to recurrent episodes of VT and several shocks outpt, cath today without obstructive disease), EP increased baseline pacing rate to 80, this was done -- spoke with DOD and EP, will start IV amiodarone for now. Check mag -- ordered home Cpap  Signed, Laverda Page, NP-C 06/07/2023, 6:43 PM Pager: [phone]

## 2023-06-07 NOTE — Progress Notes (Addendum)
Patient went into [place], sustained for about two minutes, symptomatic. ICD never fired. Heart rates ran into the 160's. He was able to convert on his own. Rapid response nurse notified of patient findings, current BP 120/77, Placed on 2L of oxygen due to shortness of breath. Patient stated he felt like he was going to pass out, NP notified of findings. EKG obtained.

## 2023-06-07 NOTE — Interval H&P Note (Signed)
History and Physical Interval Note:  06/07/2023 8:09 AM  [name] [name]  has presented today for surgery, with the diagnosis of heart failure - vtach.  The various methods of treatment have been discussed with the patient and family. After consideration of risks, benefits and other options for treatment, the patient has consented to  Procedure(s): RIGHT/LEFT HEART CATH AND CORONARY ANGIOGRAPHY (N/A) as a surgical intervention.  The patient's history has been reviewed, patient examined, no change in status, stable for surgery.  I have reviewed the patient's chart and labs.  Questions were answered to the patient's satisfaction.     Torryn Hudspeth [employer]

## 2023-06-08 ENCOUNTER — Other Ambulatory Visit (HOSPITAL_COMMUNITY): Payer: Self-pay

## 2023-06-08 ENCOUNTER — Observation Stay (HOSPITAL_BASED_OUTPATIENT_CLINIC_OR_DEPARTMENT_OTHER): Payer: Medicare HMO

## 2023-06-08 DIAGNOSIS — I5042 Chronic combined systolic (congestive) and diastolic (congestive) heart failure: Secondary | ICD-10-CM | POA: Diagnosis not present

## 2023-06-08 DIAGNOSIS — Z96653 Presence of artificial knee joint, bilateral: Secondary | ICD-10-CM | POA: Diagnosis not present

## 2023-06-08 DIAGNOSIS — Z96641 Presence of right artificial hip joint: Secondary | ICD-10-CM | POA: Diagnosis not present

## 2023-06-08 DIAGNOSIS — I480 Paroxysmal atrial fibrillation: Secondary | ICD-10-CM | POA: Diagnosis not present

## 2023-06-08 DIAGNOSIS — Z7901 Long term (current) use of anticoagulants: Secondary | ICD-10-CM | POA: Diagnosis not present

## 2023-06-08 DIAGNOSIS — I2510 Atherosclerotic heart disease of native coronary artery without angina pectoris: Secondary | ICD-10-CM | POA: Diagnosis not present

## 2023-06-08 DIAGNOSIS — I5021 Acute systolic (congestive) heart failure: Secondary | ICD-10-CM | POA: Diagnosis not present

## 2023-06-08 DIAGNOSIS — I472 Ventricular tachycardia, unspecified: Secondary | ICD-10-CM | POA: Diagnosis not present

## 2023-06-08 DIAGNOSIS — I5022 Chronic systolic (congestive) heart failure: Secondary | ICD-10-CM | POA: Diagnosis not present

## 2023-06-08 DIAGNOSIS — Z79899 Other long term (current) drug therapy: Secondary | ICD-10-CM | POA: Diagnosis not present

## 2023-06-08 DIAGNOSIS — Z95810 Presence of automatic (implantable) cardiac defibrillator: Secondary | ICD-10-CM | POA: Diagnosis not present

## 2023-06-08 LAB — ECHOCARDIOGRAM COMPLETE
AR max vel: 2.07 cm2
AV Area VTI: 1.92 cm2
AV Area mean vel: 2.04 cm2
AV Mean grad: 5 mmHg
AV Peak grad: 8 mmHg
Ao pk vel: 1.41 m/s
Area-P 1/2: 3.72 cm2
Height: 77 in
S' Lateral: 4.8 cm
Weight: 5088 oz

## 2023-06-08 LAB — BASIC METABOLIC PANEL
Anion gap: 7 (ref 5–15)
BUN: 18 mg/dL (ref 6–20)
CO2: 28 mmol/L (ref 22–32)
Calcium: 8.8 mg/dL — ABNORMAL LOW (ref 8.9–10.3)
Chloride: 106 mmol/L (ref 98–111)
Creatinine, Ser: 1.27 mg/dL — ABNORMAL HIGH (ref 0.61–1.24)
GFR, Estimated: >60 mL/min (ref >60)
Glucose, Bld: 119 mg/dL — ABNORMAL HIGH (ref 70–99)
Potassium: 3.5 mmol/L (ref 3.5–5.1)
Sodium: 141 mmol/L (ref 135–145)

## 2023-06-08 MED ORDER — POTASSIUM CHLORIDE CRYS ER 20 MEQ PO TBCR: 20.0000 meq | EXTENDED_RELEASE_TABLET | Freq: Every day | ORAL | Status: DC

## 2023-06-08 MED ORDER — SERTRALINE HCL 50 MG PO TABS
25.0000 mg | ORAL_TABLET | Freq: Every day | ORAL | Status: DC
  Administered 2023-06-08: 25 mg via ORAL
  Filled 2023-06-08: qty 1

## 2023-06-08 MED ORDER — METOPROLOL SUCCINATE ER 25 MG PO TB24
25.0000 mg | ORAL_TABLET | Freq: Every day | ORAL | 5 refills | Status: DC
  Filled 2023-06-08: qty 30, 30d supply, fill #0

## 2023-06-08 MED ORDER — POTASSIUM CHLORIDE CRYS ER 20 MEQ PO TBCR
40.0000 meq | EXTENDED_RELEASE_TABLET | Freq: Once | ORAL | Status: AC
  Administered 2023-06-08: 40 meq via ORAL
  Filled 2023-06-08: qty 2

## 2023-06-08 MED ORDER — METOPROLOL SUCCINATE ER 25 MG PO TB24
25.0000 mg | ORAL_TABLET | Freq: Every day | ORAL | Status: DC
  Administered 2023-06-08: 25 mg via ORAL
  Filled 2023-06-08: qty 1

## 2023-06-08 MED ORDER — SERTRALINE HCL 25 MG PO TABS
25.0000 mg | ORAL_TABLET | Freq: Every day | ORAL | 1 refills | Status: DC
  Filled 2023-06-08: qty 30, 30d supply, fill #0

## 2023-06-08 MED ORDER — SOTALOL HCL 80 MG PO TABS
80.0000 mg | ORAL_TABLET | Freq: Two times a day (BID) | ORAL | 3 refills | Status: DC
  Filled 2023-06-08: qty 60, 30d supply, fill #0

## 2023-06-08 NOTE — TOC Progression Note (Signed)
Transition of Care [facility]) - Progression Note    Patient Details  Name: [name] [name] MRN: [id number] Date of Birth: [birth year]  [facility]) CM/SW Contact  Nicanor Bake Phone Number: [phone] 06/08/2023, 2:20 PM  Clinical Narrative:   HF CSW met with pt and significant other at bedside. CSW built rapport with pt and  family. Pt stated that he lives a lone with his pet dog. Pt does not drive, but has great family support. Pt stated that he has not history with HH services. Pt stated that the only equipment he has uses is his BiPAP machine. Pt stated that he has a scale. Pt stated that he needs to establish care with a PCP. CSW explained that a follow up hospital appointment will be scheduled closer to dc.   TOC will continue following.     Expected Discharge Plan: Home/Self Care Barriers to Discharge: Continued Medical Work up  Expected Discharge Plan and Services       Living arrangements for the past 2 months: Single Family Home                                       Social Determinants of Health (SDOH) Interventions SDOH Screenings   Food Insecurity: No Food Insecurity (06/07/2023)  Housing: Low Risk  (06/07/2023)  Transportation Needs: No Transportation Needs (06/07/2023)  Utilities: Not At Risk (06/07/2023)  Alcohol Screen: Low Risk  (05/15/2021)  Depression (PHQ2-9): Low Risk  (05/15/2021)  Financial Resource Strain: Low Risk  (05/15/2021)  Physical Activity: Insufficiently Active (05/15/2021)  Social Connections: Moderately Isolated (05/15/2021)  Stress: No Stress Concern Present (05/15/2021)  Tobacco Use: Medium Risk (06/07/2023)    Readmission Risk Interventions     No data to display

## 2023-06-08 NOTE — Discharge Summary (Signed)
HGB 11.0 (L) 06/07/2023   HCT 31.4 (L) 06/07/2023   MCV 97.8 06/07/2023   PLT 144 (L) 06/07/2023    Recent Labs  Lab 06/07/23 1905 06/08/23 0840  NA 140 141  K 3.5 3.5  CL 106 106  CO2 26 28  BUN 22* 18  CREATININE 1.60* 1.27*  CALCIUM 9.1 8.8*  PROT 6.4*  --   BILITOT 1.0  --   ALKPHOS 62  --   ALT 29  --   AST 22  --   GLUCOSE 144* 119*   Lab Results  Component Value Date   CHOL 104 05/18/2021   HDL 40 (L) 05/18/2021   LDLCALC 50 05/18/2021    TRIG 69 05/18/2021   BNP (last 3 results) Recent Labs    06/07/23 1905  BNP 312.6*    ProBNP (last 3 results) No results for input(s): "PROBNP" in the last 8760 hours.   Diagnostic Studies/Procedures   ECHOCARDIOGRAM COMPLETE  Result Date: 06/08/2023    ECHOCARDIOGRAM REPORT   Patient Name:   [name] [name] Date of Exam: 06/08/2023 Medical Rec #:  [id number]   Height:       77.0 in Accession #:    [id number]  Weight:       318.0 lb Date of Birth:  [birth year]   BSA:          2.724 m Patient Age:    62 years    BP:           100/58 mmHg Patient Gender: M           HR:           85 bpm. Exam Location:  Inpatient Procedure: 2D Echo, Cardiac Doppler and Color Doppler Indications:    CHF-Acute Systolic I50.21  History:        Patient has prior history of Echocardiogram examinations, most                 recent 08/19/2021. Cardiomegaly, CAD, Defibrillator,                 Signs/Symptoms:Dyspnea; Risk Factors:Hypertension.  Sonographer:    Darlys Gales Referring Phys: Eliot Ford [name] IMPRESSIONS  1. Technically difficult study.  2. Left ventricular ejection fraction, by estimation, is 20 to 25%. The left ventricle has severely decreased function. The left ventricle has no regional wall motion abnormalities. There is mild concentric left ventricular hypertrophy. Left ventricular  diastolic function could not be evaluated.  3. Right ventricular systolic function is normal. The right ventricular size is normal.  4. The mitral valve is normal in structure. No evidence of mitral valve regurgitation. No evidence of mitral stenosis.  5. The aortic valve is normal in structure. There is mild calcification of the aortic valve. Aortic valve regurgitation is not visualized. No aortic stenosis is present.  6. The inferior vena cava is normal in size with greater than 50% respiratory variability, suggesting right atrial pressure of 3 mmHg. FINDINGS  Left Ventricle: Left ventricular ejection fraction, by estimation, is 20 to  25%. The left ventricle has severely decreased function. The left ventricle has no regional wall motion abnormalities. The left ventricular internal cavity size was normal in size. There is mild concentric left ventricular hypertrophy. Abnormal (paradoxical) septal motion, consistent with RV pacemaker. Left ventricular diastolic function could not be evaluated due to atrial fibrillation. Left ventricular diastolic function could not be evaluated. Right Ventricle: The right ventricular size is normal. No increase in right ventricular wall thickness.  Advanced Heart Failure Team  Discharge Summary   Patient ID: [name] [name] MRN: [id number], DOB/AGE: 01-19-1961 62 y.o. Admit date: 06/07/2023 D/C date:     06/08/2023   Primary Discharge Diagnoses:  Ventricular Tachycardia w/ Recent ICD Shock  Chronic Systolic Heart Failure Nonobstructive CAD Depression and Anxiety    Hospital Course:  62 y.o. male with a past medical history of chronic systolic CHF (EF 87% in April 5643), dilated NICM s/p BiV ICD, PAF with intermittent compliance with Xarelto, ETOH abuse, tobacco abuse, OSA on CPAP, VT, and HTN.    He has had 2 episodes of VT with ICD shock in the last week. Brought in for diagnostic [facility] and EP evaluation. RHC/LHC showed nonobstructive mild CAD, preserved cardiac output, and relatively normal filling pressures. He was admitted for overnight observation and EP consultation. Overnight, he had another 3 min run of VT. Rate too slow for ICD therapies. Self-terminated. EP felt that acute emotional stressors (death of his mother) may be possible trigger. They recommended continuation of PO amiodarone, sotalol and added Metoprolol XL, 25 mg daily, as an adjunct. HF GDMT and home diuretic regimen continued. Given depression and anxiety, he was started on Zoloft.   On 9/20, he was last seen and examined by Dr. Shirlee Latch and felt stable for d/c home. EP team also deemed ok for d/c. He has close clinic f/us arranged in both EP and the [facility].   See detailed hospital problem list below  1. VT: Has been chronically on amiodarone with addition of sotalol to lower DFT.  QTc 511 msec today (paced).  He has had 2 out of hospital episodes of VT with ICD shock in the last week.  Cath this admit showed optimized filling pressures and preserved cardiac output, minimal CAD.  Had recurrent VT overnight. EP increased baseline pacing rate to 80. Started on amio gtt. Back in SR, V paced on tele. - continue IV amio load. Keep K > 4.0 and Mg > 2.0 - formal EP consult today  for further guidance   2. Chronic systolic CHF: NICM, Echo 12/2016 EF 15%. Normal coronaries on cath in July 2016. Medtronic CRT-D device. ETOH vs HTN vs myocarditis as cause of cardiomyopathy. Echo 12/22 showed that EF remains 20-25%.  PYP scan not suggestive of TTR cardiac amyloidosis.  RHC/LHC yesterday showed nonobstructive mild CAD, preserved cardiac output, and relatively normal filling pressures.  NYHA class II symptoms, at baseline currently.   - Continue torsemide 60 mg daily alternating with 40 mg every other day.  - Continue Entresto 49/51 mg bid. - Continue spironolactone 25 mg daily.  - Intolerant to Coreg in the past with hypotension, he is on sotalol and amiodarone.  - He stopped Jardiance after 3 UTIs, I will not start back.  3. Paroxysmal atrial fibrillation: He is in NSR today.  - Continue amiodarone. LFTs/TSH checked on admit and WNL  - Continue Xarelto 20 mg daily.   4. ETOH abuse: Suspect this contributes to cardiomyopathy.  Still drinking moderately, I have encouraged him to cut back.  5. OSA: Use CPAP.  6. Depression/Anxiety: lots of life stressors + mother recently passed away. He is asking for pharmacological therapy - added Zoloft   Discharge Weight Range: 318 lb  Discharge Vitals: Blood pressure 105/70, pulse 82, temperature 98.7 F (37.1 C), temperature source Oral, resp. rate 18, height 6\' 5"  (1.956 m), weight (!) 144.2 kg, SpO2 96%.  Labs: Lab Results  Component Value Date   WBC 4.0 06/07/2023  HGB 11.0 (L) 06/07/2023   HCT 31.4 (L) 06/07/2023   MCV 97.8 06/07/2023   PLT 144 (L) 06/07/2023    Recent Labs  Lab 06/07/23 1905 06/08/23 0840  NA 140 141  K 3.5 3.5  CL 106 106  CO2 26 28  BUN 22* 18  CREATININE 1.60* 1.27*  CALCIUM 9.1 8.8*  PROT 6.4*  --   BILITOT 1.0  --   ALKPHOS 62  --   ALT 29  --   AST 22  --   GLUCOSE 144* 119*   Lab Results  Component Value Date   CHOL 104 05/18/2021   HDL 40 (L) 05/18/2021   LDLCALC 50 05/18/2021    TRIG 69 05/18/2021   BNP (last 3 results) Recent Labs    06/07/23 1905  BNP 312.6*    ProBNP (last 3 results) No results for input(s): "PROBNP" in the last 8760 hours.   Diagnostic Studies/Procedures   ECHOCARDIOGRAM COMPLETE  Result Date: 06/08/2023    ECHOCARDIOGRAM REPORT   Patient Name:   [name] [name] Date of Exam: 06/08/2023 Medical Rec #:  [id number]   Height:       77.0 in Accession #:    [id number]  Weight:       318.0 lb Date of Birth:  [birth year]   BSA:          2.724 m Patient Age:    62 years    BP:           100/58 mmHg Patient Gender: M           HR:           85 bpm. Exam Location:  Inpatient Procedure: 2D Echo, Cardiac Doppler and Color Doppler Indications:    CHF-Acute Systolic I50.21  History:        Patient has prior history of Echocardiogram examinations, most                 recent 08/19/2021. Cardiomegaly, CAD, Defibrillator,                 Signs/Symptoms:Dyspnea; Risk Factors:Hypertension.  Sonographer:    Darlys Gales Referring Phys: Eliot Ford [name] IMPRESSIONS  1. Technically difficult study.  2. Left ventricular ejection fraction, by estimation, is 20 to 25%. The left ventricle has severely decreased function. The left ventricle has no regional wall motion abnormalities. There is mild concentric left ventricular hypertrophy. Left ventricular  diastolic function could not be evaluated.  3. Right ventricular systolic function is normal. The right ventricular size is normal.  4. The mitral valve is normal in structure. No evidence of mitral valve regurgitation. No evidence of mitral stenosis.  5. The aortic valve is normal in structure. There is mild calcification of the aortic valve. Aortic valve regurgitation is not visualized. No aortic stenosis is present.  6. The inferior vena cava is normal in size with greater than 50% respiratory variability, suggesting right atrial pressure of 3 mmHg. FINDINGS  Left Ventricle: Left ventricular ejection fraction, by estimation, is 20 to  25%. The left ventricle has severely decreased function. The left ventricle has no regional wall motion abnormalities. The left ventricular internal cavity size was normal in size. There is mild concentric left ventricular hypertrophy. Abnormal (paradoxical) septal motion, consistent with RV pacemaker. Left ventricular diastolic function could not be evaluated due to atrial fibrillation. Left ventricular diastolic function could not be evaluated. Right Ventricle: The right ventricular size is normal. No increase in right ventricular wall thickness.  Advanced Heart Failure Team  Discharge Summary   Patient ID: [name] [name] MRN: [id number], DOB/AGE: 01-19-1961 62 y.o. Admit date: 06/07/2023 D/C date:     06/08/2023   Primary Discharge Diagnoses:  Ventricular Tachycardia w/ Recent ICD Shock  Chronic Systolic Heart Failure Nonobstructive CAD Depression and Anxiety    Hospital Course:  62 y.o. male with a past medical history of chronic systolic CHF (EF 87% in April 5643), dilated NICM s/p BiV ICD, PAF with intermittent compliance with Xarelto, ETOH abuse, tobacco abuse, OSA on CPAP, VT, and HTN.    He has had 2 episodes of VT with ICD shock in the last week. Brought in for diagnostic [facility] and EP evaluation. RHC/LHC showed nonobstructive mild CAD, preserved cardiac output, and relatively normal filling pressures. He was admitted for overnight observation and EP consultation. Overnight, he had another 3 min run of VT. Rate too slow for ICD therapies. Self-terminated. EP felt that acute emotional stressors (death of his mother) may be possible trigger. They recommended continuation of PO amiodarone, sotalol and added Metoprolol XL, 25 mg daily, as an adjunct. HF GDMT and home diuretic regimen continued. Given depression and anxiety, he was started on Zoloft.   On 9/20, he was last seen and examined by Dr. Shirlee Latch and felt stable for d/c home. EP team also deemed ok for d/c. He has close clinic f/us arranged in both EP and the [facility].   See detailed hospital problem list below  1. VT: Has been chronically on amiodarone with addition of sotalol to lower DFT.  QTc 511 msec today (paced).  He has had 2 out of hospital episodes of VT with ICD shock in the last week.  Cath this admit showed optimized filling pressures and preserved cardiac output, minimal CAD.  Had recurrent VT overnight. EP increased baseline pacing rate to 80. Started on amio gtt. Back in SR, V paced on tele. - continue IV amio load. Keep K > 4.0 and Mg > 2.0 - formal EP consult today  for further guidance   2. Chronic systolic CHF: NICM, Echo 12/2016 EF 15%. Normal coronaries on cath in July 2016. Medtronic CRT-D device. ETOH vs HTN vs myocarditis as cause of cardiomyopathy. Echo 12/22 showed that EF remains 20-25%.  PYP scan not suggestive of TTR cardiac amyloidosis.  RHC/LHC yesterday showed nonobstructive mild CAD, preserved cardiac output, and relatively normal filling pressures.  NYHA class II symptoms, at baseline currently.   - Continue torsemide 60 mg daily alternating with 40 mg every other day.  - Continue Entresto 49/51 mg bid. - Continue spironolactone 25 mg daily.  - Intolerant to Coreg in the past with hypotension, he is on sotalol and amiodarone.  - He stopped Jardiance after 3 UTIs, I will not start back.  3. Paroxysmal atrial fibrillation: He is in NSR today.  - Continue amiodarone. LFTs/TSH checked on admit and WNL  - Continue Xarelto 20 mg daily.   4. ETOH abuse: Suspect this contributes to cardiomyopathy.  Still drinking moderately, I have encouraged him to cut back.  5. OSA: Use CPAP.  6. Depression/Anxiety: lots of life stressors + mother recently passed away. He is asking for pharmacological therapy - added Zoloft   Discharge Weight Range: 318 lb  Discharge Vitals: Blood pressure 105/70, pulse 82, temperature 98.7 F (37.1 C), temperature source Oral, resp. rate 18, height 6\' 5"  (1.956 m), weight (!) 144.2 kg, SpO2 96%.  Labs: Lab Results  Component Value Date   WBC 4.0 06/07/2023

## 2023-06-08 NOTE — Progress Notes (Addendum)
Advanced Heart Failure Rounding Note  PCP-Cardiologist: Marca Ancona, MD   Subjective:    Had recurrent VT overnight. EP increased baseline pacing rate to 80. Started on amio gtt.   Back in Sinus, V paced 80s.   K 3.5. Mg was 2.1 yesterday.   Feels ok this morning. No CP or dyspnea. Only issue is depression and anxiety. His mother recently died. He is asking for something to help w/ anxiety    Objective:   Weight Range: (!) 144.2 kg Body mass index is 37.71 kg/m.   Vital Signs:   Temp:  [97.6 F (36.4 C)-98.9 F (37.2 C)] 98.2 F (36.8 C) (09/20 0804) Pulse Rate:  [59-83] 80 (09/20 0804) Resp:  [18-31] 18 (09/20 0804) BP: (93-152)/(54-136) 152/136 (09/20 0804) SpO2:  [87 %-99 %] 97 % (09/20 0804) FiO2 (%):  [28 %] 28 % (09/19 2226) Weight:  [144.2 kg] 144.2 kg (09/20 0445) Last BM Date : 06/07/23  Weight change: Filed Weights   06/07/23 0614 06/08/23 0445  Weight: (!) 147.4 kg (!) 144.2 kg    Intake/Output:   Intake/Output Summary (Last 24 hours) at 06/08/2023 1112 Last data filed at 06/08/2023 0543 Gross per 24 hour  Intake 718.77 ml  Output 3000 ml  Net -2281.23 ml      Physical Exam    General:  Well appearing. No resp difficulty HEENT: Normal Neck: Supple. JVP not elevated . Carotids 2+ bilat; no bruits. No lymphadenopathy or thyromegaly appreciated. Cor: PMI nondisplaced. Regular rate & rhythm. No rubs, gallops or murmurs. Lungs: Clear Abdomen: Soft, nontender, nondistended. No hepatosplenomegaly. No bruits or masses. Good bowel sounds. Extremities: No cyanosis, clubbing, rash, edema Neuro: Alert & orientedx3, cranial nerves grossly intact. moves all 4 extremities w/o difficulty. Affect pleasant   Telemetry   V paced 80s   EKG    No new EKG to review   Labs    CBC Recent Labs    06/07/23 0823 06/07/23 1905  WBC  --  4.0  NEUTROABS  --  2.7  HGB 9.5*  9.5* 11.0*  HCT 28.0*  28.0* 31.4*  MCV  --  97.8  PLT  --  144*    Basic Metabolic Panel Recent Labs    57/84/69 1905 06/08/23 0840  NA 140 141  K 3.5 3.5  CL 106 106  CO2 26 28  GLUCOSE 144* 119*  BUN 22* 18  CREATININE 1.60* 1.27*  CALCIUM 9.1 8.8*  MG 2.1  --    Liver Function Tests Recent Labs    06/07/23 1905  AST 22  ALT 29  ALKPHOS 62  BILITOT 1.0  PROT 6.4*  ALBUMIN 3.8   No results for input(s): "LIPASE", "AMYLASE" in the last 72 hours. Cardiac Enzymes No results for input(s): "CKTOTAL", "CKMB", "CKMBINDEX", "TROPONINI" in the last 72 hours.  BNP: BNP (last 3 results) Recent Labs    06/07/23 1905  BNP 312.6*    ProBNP (last 3 results) No results for input(s): "PROBNP" in the last 8760 hours.   D-Dimer No results for input(s): "DDIMER" in the last 72 hours. Hemoglobin A1C No results for input(s): "HGBA1C" in the last 72 hours. Fasting Lipid Panel No results for input(s): "CHOL", "HDL", "LDLCALC", "TRIG", "CHOLHDL", "LDLDIRECT" in the last 72 hours. Thyroid Function Tests Recent Labs    06/07/23 1905  TSH 1.137    Other results:   Imaging    No results found.   Medications:     Scheduled Medications:  atorvastatin  80 mg Oral QPM   influenza vac split trivalent PF  0.5 mL Intramuscular Tomorrow-1000   multivitamin with minerals  1 tablet Oral Daily   rivaroxaban  20 mg Oral Q supper   sacubitril-valsartan  1 tablet Oral BID   sodium chloride flush  3 mL Intravenous Q12H   sodium chloride flush  3 mL Intravenous Q12H   sotalol  80 mg Oral Q12H   spironolactone  25 mg Oral Daily   torsemide  40 mg Oral q1800   torsemide  60 mg Oral Daily    Infusions:  sodium chloride     sodium chloride     amiodarone 30 mg/hr (06/08/23 0635)    PRN Medications: sodium chloride, sodium chloride, acetaminophen, albuterol, ondansetron (ZOFRAN) IV, ondansetron (ZOFRAN) IV, sodium chloride flush, sodium chloride flush    Patient Profile   62 y.o.with a past medical history of chronic systolic CHF   (EF 15% in April 2018), dilated NICM s/p BiV ICD, PAF with intermittent compliance with Xarelto, ETOH abuse, tobacco abuse, OSA on CPAP, VT, and HTN.   He has had 2 episodes of VT with ICD shock in the last week. Brought in for diagnostic [facility] and EP evaluation.   Assessment/Plan   1. VT: Has been chronically on amiodarone with addition of sotalol to lower DFT.  QTc 511 msec today (paced).  He has had 2 out of hospital episodes of VT with ICD shock in the last week.  Cath this admit showed optimized filling pressures and preserved cardiac output, minimal CAD.  Had recurrent VT overnight. EP increased baseline pacing rate to 80. Started on amio gtt. Back in SR, V paced on tele. - continue IV amio load. Keep K > 4.0 and Mg > 2.0 - formal EP consult today for further guidance   2. Chronic systolic CHF: NICM, Echo 12/2016 EF 15%. Normal coronaries on cath in July 2016. Medtronic CRT-D device. ETOH vs HTN vs myocarditis as cause of cardiomyopathy. Echo 12/22 showed that EF remains 20-25%.  PYP scan not suggestive of TTR cardiac amyloidosis.  RHC/LHC yesterday showed nonobstructive mild CAD, preserved cardiac output, and relatively normal filling pressures.  NYHA class II symptoms, at baseline currently.   - Continue torsemide 60 mg daily alternating with 40 mg every other day.  - Continue Entresto 49/51 mg bid. - Continue spironolactone 25 mg daily.  - Intolerant to Coreg in the past with hypotension, he is on sotalol and amiodarone.  - He stopped Jardiance after 3 UTIs, I will not start back.  3. Paroxysmal atrial fibrillation: He is in NSR today.  - Continue amiodarone. LFTs/TSH checked on admit and WNL  - Continue Xarelto 20 mg daily.   4. ETOH abuse: Suspect this contributes to cardiomyopathy.  Still drinking moderately, I have encouraged him to cut back.  5. OSA: Use CPAP.  6. Depression/Anxiety: lots of life stressors + mother recently passed away. He is asking for pharmacological therapy -  will discuss initiation of Zoloft w/ MD. Discussed w/ EP. They are ok with it    Length of Stay: 0  Robbie Lis, PA-C  06/08/2023, 11:12 AM  Advanced Heart Failure Team Pager [phone] (M-F; 7a - 5p)  Please contact [facility] for night-coverage after hours (5p -7a ) and weekends on [email or web address]  Patient seen with PA, agree with the above note.  3 minute run of monomorphic VT last night.  Rate too slow for ICD therapies.  Self-terminated.    No complaints this  morning, no dyspnea or chest pain.   General: NAD Neck: No JVD, no thyromegaly or thyroid nodule.  Lungs: Clear to auscultation bilaterally with normal respiratory effort. CV: Nondisplaced PMI.  Heart regular S1/S2, no S3/S4, no murmur.  No peripheral edema.  Abdomen: Soft, nontender, no hepatosplenomegaly, no distention.  Skin: Intact without lesions or rashes.  Neurologic: Alert and oriented x 3.  Psych: Normal affect. Extremities: No clubbing or cyanosis.  HEENT: Normal.   Reviewed with EP.  Patient had acute emotional stressors recently, this may have been a trigger.  Will add Toprol XL 25 mg daily to regimen and let him go home today.    Meds for discharge:  Toprol XL 25 daily Amiodarone 200 mg bid Sotalol 80 mg bid KCl 20 daily  Torsemide 60 qam/40 qpm Atorvastatin 80 Xarelto 20 Spironolactone 25 daily Entresto 49/51 bid.   Followup with EP and in HF clinic.   Marca Ancona 06/08/2023 1:35 PM

## 2023-06-08 NOTE — Consult Note (Addendum)
Cardiology Consultation   Patient ID: [name] [name] MRN: [id number]; DOB: 06-Jan-1961  Admit date: 06/07/2023 Date of Consult: 06/08/2023  PCP:  [name], NP   [facility] Providers Cardiologist:  Marca Ancona, MD  Electrophysiologist:  Sherryl Manges, MD  Advanced Heart Failure:  Arvilla Meres, MD  {    Patient Profile:   [name] [name] is a 62 y.o. male with a hx of  chronic systolic CHF (EF 46% in April 2018 . 20-25% Dec 2020), dilated NICM s/p bi V ICD (PYP scan not suggestive of TTR cardiac amyloidosis by HF review), PAF, ETOH abuse, tobacco abuse, OSA on CPAP, and HTN, VT who is being seen 06/08/2023 for the evaluation of recurrent VT at the request of Dr. Shirlee Latch.  Device information MDT CRT-D, implanted 04/14/15, Dr. Graciela Husbands + known PMVT hx (and AF) * h/o appropriate therapies   AAD  05/30/17 had VT failed 3 ATPs, third accelerated VT to VF, 3 failed shocks at max output 4th restored SR 04/17/20, VT w/apprpriate shocks  amiodarone AND sotalol are current   History of Present Illness:   Mr. Meller saw Dr. Graciela Husbands 04/20/23, discussed VT below detection, planned for labs, felt to be volume OL and torsemide adjusted, rate response turned on with chronotropic incompetence   Saw A. 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 Hs hx of VT though had been years, with recurrent uptick again, planned for LHC and echo  Came yesterday for his cath, noting  Mid LAD lesion is 30% stenosed.   1. Normal PCWP and LVEDP, minimally elevated RA pressure.  2. Preserved cardiac output.  3. Nonobstructive mild CAD.   Dr. Shirlee Latch reached out post cath yesterday d/w briefly with Dr. Lalla Brothers, options for his VT given no obstructive CAD, recommended increase his pacing rate to 80 and have f/u with Dr. Graciela Husbands.  Planned to do this (was done) and monitor him over night.  Last night he had sustained MMVT 157bpm, no device  therapies, self terminated, lasted  LABS K+ 3.5 Mag 2.1 BUN/Creat 18/1.27 BNP 312 WBC 4.0 H/H 11/32 Plts 144 TSH 1.137  Home AAD Amiodarone 200mg  daily (looks like it goes back to 2016) Sotalol 80mg  BID, added 2018 with failed ICD shocks  He reports excellent medication compliance, Mardelle Matte had him take his amiodarone BID< and has been, after Dr. Graciela Husbands made an adjustment to his device he did and has been feeling better, able to do more, better energy No CP A few days ago he reports sitting comfortable and started to get lightheaded and was shocked, also aware of his heart racing, last night not as much but aware   Past Medical History:  Diagnosis Date   AICD (automatic cardioverter/defibrillator) present    Arthritis    Atrial fibrillation (HCC)    CAD (coronary artery disease)    Mild nonobstructive (Cath 09)   Chronic systolic heart failure (HCC)    Nonischemic CM: echo 4/12 with EF 25-30%, grade 2 diast dysfxn, mild dilated aortic root 43 mm, trivial MR, mod LAE   COPD (chronic obstructive pulmonary disease) (HCC)    Dyspnea    with exertion    Ejection fraction < 50% 08/2019   20-25% noted on ECHO   H/O ETOH abuse    HTN (hypertension)    Hypersomnia    Nonischemic cardiomyopathy (HCC)    Obesity, morbid (HCC)    OSA  on CPAP    Systolic and diastolic CHF, acute on chronic (HCC) 11/2016   Tobacco use disorder    Ventricular tachycardia [facility])     Past Surgical History:  Procedure Laterality Date   CARDIAC CATHETERIZATION N/A 04/08/2015   Procedure: Right/Left Heart Cath and Coronary Angiography;  Surgeon: Corky Crafts, MD;  Location: [facility] INVASIVE CV LAB;  Service: Cardiovascular;  Laterality: N/A;   EP IMPLANTABLE DEVICE N/A 04/14/2015   Procedure: BiV ICD Insertion CRT-D;  Surgeon: [name], MD;  Location: [facility] INVASIVE CV LAB;  Service: Cardiovascular;  Laterality: N/A;   HYDROCELE EXCISION / REPAIR     ICD GENERATOR CHANGEOUT N/A 02/20/2022    Procedure: ICD GENERATOR CHANGEOUT;  Surgeon: [name], MD;  Location: [facility] INVASIVE CV LAB;  Service: Cardiovascular;  Laterality: N/A;   ICD IMPLANT     RIGHT/LEFT HEART CATH AND CORONARY ANGIOGRAPHY N/A 06/07/2023   Procedure: RIGHT/LEFT HEART CATH AND CORONARY ANGIOGRAPHY;  Surgeon: [name], MD;  Location: [facility] INVASIVE CV LAB;  Service: Cardiovascular;  Laterality: N/A;   TOTAL HIP ARTHROPLASTY Right 11/04/2018   Procedure: TOTAL HIP ARTHROPLASTY ANTERIOR APPROACH;  Surgeon: [name], MD;  Location: [facility] ORS;  Service: Orthopedics;  Laterality: Right;   TOTAL KNEE ARTHROPLASTY Right 01/30/2020   Procedure: TOTAL KNEE ARTHROPLASTY;  Surgeon: [name], MD;  Location: [facility];  Service: Orthopedics;  Laterality: Right;   TOTAL KNEE ARTHROPLASTY Left 07/30/2020   Procedure: TOTAL KNEE ARTHROPLASTY;  Surgeon: [name], MD;  Location: [facility];  Service: Orthopedics;  Laterality: Left;   TUMOR EXCISION Left    TUMOR EXCISION Left 1982   Benign tumor removed from L leg   VASECTOMY       Home Medications:  Prior to Admission medications   Medication Sig Start Date End Date Taking? Authorizing Provider  albuterol (VENTOLIN HFA) 108 (90 Base) MCG/ACT inhaler Inhale 1 puff into the lungs every 6 (six) hours as needed for wheezing or shortness of breath. 12/09/19  Yes [name], MD  amiodarone (PACERONE) 200 MG tablet Take 1 tablet (200 mg total) by mouth 2 (two) times daily. 06/01/23  Yes [name], MD  Ascorbic Acid (VITAMIN C) 100 MG tablet Take 100 mg by mouth daily.   Yes [provider]  atorvastatin (LIPITOR) 80 MG tablet TAKE 1 TABLET EVERY EVENING 04/05/23  Yes [name], MD  ibuprofen (ADVIL) 200 MG tablet Take 400 mg by mouth every 8 (eight) hours as needed for moderate pain.   Yes [provider]  Multiple Vitamins-Minerals (MULTIVITAMIN WITH MINERALS) tablet Take 1 tablet by mouth daily.   Yes [provider]  OVER THE COUNTER  MEDICATION Take 1 tablet by mouth daily. Healthy eye care   Yes [provider]  sacubitril-valsartan (ENTRESTO) 49-51 MG Take 1 tablet by mouth 2 (two) times daily. 05/18/22  Yes [name], MD  sotalol (BETAPACE) 80 MG tablet TAKE 1 TABLET EVERY 12 HOURS (NEED FOLLOW UP APPOINTMENT FOR ANYMORE REFILLS) 05/29/23  Yes [name], MD  spironolactone (ALDACTONE) 25 MG tablet TAKE 1 TABLET EVERY DAY 05/29/23  Yes [name], MD  torsemide (DEMADEX) 20 MG tablet TAKE 3 TABLETS EVERY MORNING AND TAKE 2 TABLETS EVERY EVENING 05/29/23  Yes [name], MD  XARELTO 20 MG TABS tablet TAKE 1 TABLET DAILY WITH SUPPER. 03/14/23  Yes [name], MD    Inpatient Medications: Scheduled Meds:  atorvastatin  80 mg  Oral QPM   influenza vac split trivalent PF  0.5 mL Intramuscular Tomorrow-1000   multivitamin with minerals  1 tablet Oral Daily   rivaroxaban  20 mg Oral Q supper   sacubitril-valsartan  1 tablet Oral BID   sodium chloride flush  3 mL Intravenous Q12H   sodium chloride flush  3 mL Intravenous Q12H   sotalol  80 mg Oral Q12H   spironolactone  25 mg Oral Daily   torsemide  40 mg Oral q1800   torsemide  60 mg Oral Daily   Continuous Infusions:  sodium chloride     sodium chloride     amiodarone 30 mg/hr (06/08/23 0635)   PRN Meds: sodium chloride, sodium chloride, acetaminophen, albuterol, ondansetron (ZOFRAN) IV, ondansetron (ZOFRAN) IV, sodium chloride flush, sodium chloride flush  Allergies:    Allergies  Allergen Reactions   Sglt2 Inhibitors Other (See Comments)    Recurrent UTI    Social History:   Social History   Socioeconomic History   Marital status: Single    Spouse name: Not on file   Number of children: 2   Years of education: Not on file   Highest education level: Not on file  Occupational History   Occupation: scrap metal   Occupation: disabled, odd jobs  Tobacco Use   Smoking status: Former    Current packs/day: 0.00     Average packs/day: 0.5 packs/day for 4.0 years (2.0 ttl pk-yrs)    Types: Cigarettes    Start date: 10/29/2001    Quit date: 10/29/2005    Years since quitting: 17.6   Smokeless tobacco: Never   Tobacco comments:    about a pack a week. social  Vaping Use   Vaping status: Never Used  Substance and Sexual Activity   Alcohol use: Yes    Comment: 12 beers per week    Drug use: Not Currently    Types: Marijuana   Sexual activity: Yes    Partners: Female  Other Topics Concern   Not on file  Social History Narrative   ** Merged History Encounter **       Social Determinants of Health   Financial Resource Strain: Low Risk  (05/15/2021)   Overall Financial Resource Strain (CARDIA)    Difficulty of Paying Living Expenses: Not hard at all  Food Insecurity: No Food Insecurity (06/07/2023)   Hunger Vital Sign    Worried About Running Out of Food in the Last Year: Never true    Ran Out of Food in the Last Year: Never true  Transportation Needs: No Transportation Needs (06/07/2023)   PRAPARE - Administrator, Civil Service (Medical): No    Lack of Transportation (Non-Medical): No  Physical Activity: Insufficiently Active (05/15/2021)   Exercise Vital Sign    Days of Exercise per Week: 2 days    Minutes of Exercise per Session: 20 min  Stress: No Stress Concern Present (05/15/2021)   [employer] of Occupational Health - Occupational Stress Questionnaire    Feeling of Stress : Not at all  Social Connections: Moderately Isolated (05/15/2021)   Social Connection and Isolation Panel [NHANES]    Frequency of Communication with Friends and Family: More than three times a week    Frequency of Social Gatherings with Friends and Family: More than three times a week    Attends Religious Services: 1 to 4 times per year    Active Member of [employer] or Organizations: No    Attends Banker  Meetings: Never    Marital Status: Divorced  Catering manager Violence: Not At Risk  (06/07/2023)   Humiliation, Afraid, Rape, and Kick questionnaire    Fear of Current or Ex-Partner: No    Emotionally Abused: No    Physically Abused: No    Sexually Abused: No    Family History:    Family History  Problem Relation Age of Onset   Other Mother        cardiac surgery. late 1990s   Heart Problems Mother        CABG AGE 37   Congestive Heart Failure Father    Healthy Father        AGE 76   Coronary artery disease Other    Healthy Brother        AGE 39   Healthy Sister        AGE 59   Healthy Sister        AGE 26   Healthy Son    Healthy Son    Healthy Daughter      ROS:  Please see the history of present illness.  All other ROS reviewed and negative.     Physical Exam/Data:   Vitals:   06/08/23 0300 06/08/23 0400 06/08/23 0445 06/08/23 0804  BP: (!) 100/58 102/64 (!) 103/54 (!) 152/136  Pulse: 80 80 79 80  Resp:   20 18  Temp:   98.9 F (37.2 C) 98.2 F (36.8 C)  TempSrc:   Oral Oral  SpO2:  97% 97% 97%  Weight:   (!) 144.2 kg   Height:        Intake/Output Summary (Last 24 hours) at 06/08/2023 1037 Last data filed at 06/08/2023 0543 Gross per 24 hour  Intake 718.77 ml  Output 3000 ml  Net -2281.23 ml      06/08/2023    4:45 AM 06/07/2023    6:14 AM 06/04/2023   10:45 AM  Last 3 Weights  Weight (lbs) 318 lb 325 lb 324 lb  Weight (kg) 144.244 kg 147.419 kg 146.965 kg     Body mass index is 37.71 kg/m.  General:  Well nourished, well developed, in no acute distress HEENT: normal Neck: no JVD Vascular: No carotid bruits Cardiac:  RRR; no murmurs, gallops or rubs Lungs:  CTA b/l, no wheezing, rhonchi or rales  Abd: soft, nontender Ext: no edema Musculoskeletal:  No deformities Skin: warm and dry  Neuro: no focal abnormalities noted Psych:  Normal affect   EKG:  The EKG was personally reviewed and demonstrates:    SR/V paced 61bpm, QRS , QTc 511 (not accounting for QRS) AV pacing  80, QRS 184, manually measured QT , QTc  508 (not accounting for QRS)  Telemetry:  Telemetry was personally reviewed and demonstrates:    AV pacing, yesterday had 3 minutes of MMVT rates 150's  Relevant CV Studies:  06/07/23: R/LHC   Mid LAD lesion is 30% stenosed.   1. Normal PCWP and LVEDP, minimally elevated RA pressure.  2. Preserved cardiac output.  3. Nonobstructive mild CAD.   08/19/21: TTE 1. Left ventricular ejection fraction, by estimation, is 20 to 25%. The  left ventricle has severely decreased function. The left ventricle  demonstrates global hypokinesis. There is mild left ventricular  hypertrophy. Left ventricular diastolic parameters   are consistent with Grade I diastolic dysfunction (impaired relaxation).   2. Right ventricular systolic function is mildly reduced. The right  ventricular size is normal. Tricuspid regurgitation signal is inadequate  for assessing PA pressure.   3. Left atrial size was mildly dilated.   4. Right atrial size was mildly dilated.   5. The mitral valve is normal in structure. No evidence of mitral valve  regurgitation. No evidence of mitral stenosis.   6. The aortic valve is tricuspid. Aortic valve regurgitation is not  visualized. No aortic stenosis is present.   7. Aortic dilatation noted. There is moderate dilatation of the aortic  root, measuring 44 mm.   8. The inferior vena cava is normal in size with greater than 50%  respiratory variability, suggesting right atrial pressure of 3 mmHg.   09/16/2019: TTE IMPRESSIONS  1. Left ventricular ejection fraction, by visual estimation, is 20 to  25%. The left ventricle has severely decreased function. There is  moderately increased left ventricular hypertrophy.   2. Left ventricular diastolic parameters are consistent with Grade II  diastolic dysfunction (pseudonormalization).   3. Severely dilated left ventricular internal cavity size.   4. The left ventricle demonstrates global hypokinesis.   5. Global right ventricle has  normal systolic function.The right  ventricular size is normal. No increase in right ventricular wall  thickness.   6. A pacer wire is visualized in the RV.   7. Left atrial size was moderately dilated.   8. Right atrial size was mildly dilated.   9. The mitral valve is normal in structure. No evidence of mitral valve  regurgitation. No evidence of mitral stenosis.  10. The tricuspid valve is normal in structure.  11. The aortic valve is tricuspid. Aortic valve regurgitation is not  visualized. No evidence of aortic valve sclerosis or stenosis.  12. TR signal is inadequate for assessing pulmonary artery systolic  pressure.  13. The inferior vena cava is normal in size with greater than 50%  respiratory variability, suggesting right atrial pressure of 3 mmHg.      12/26/16: TTE Study Conclusions - Left ventricle: The cavity size was moderately dilated. Systolic   function was severely reduced. The estimated ejection fraction   was 15%. Diffuse hypokinesis. Doppler parameters are consistent   with restrictive physiology, indicative of decreased left   ventricular diastolic compliance and/or increased left atrial   pressure. - Aortic root: The aortic root was mildly dilated. - Mitral valve: Calcified annulus. There was mild regurgitation. - Left atrium: The atrium was severely dilated. - Pericardium, extracardiac: A trivial pericardial effusion was   identified. Impressions: - Technically difficult; severe global reduction in LV systolic   function; restrictive filling; mildly dilated aortic root; mild   MR; severe LAE   04/08/15: LHC Nonobstructive coronary artery disease. Elevated LVEDP Moderate pulmonary artery hypertension. Cardiac output 5.5 L/min; CI 2.1.  Laboratory Data:  High Sensitivity Troponin:  No results for input(s): "TROPONINIHS" in the last 720 hours.   Chemistry Recent Labs  Lab 06/04/23 1145 06/07/23 0823 06/07/23 1905  NA 141 142  142 140  K 4.5 3.8   3.9 3.5  CL 100  --  106  CO2 29  --  26  GLUCOSE 99  --  144*  BUN 43*  --  22*  CREATININE 1.78*  --  1.60*  CALCIUM 9.5  --  9.1  MG  --   --  2.1  GFRNONAA  --   --  49*  ANIONGAP  --   --  8    Recent Labs  Lab 06/04/23 1145 06/07/23 1905  PROT 6.6 6.4*  ALBUMIN 4.4 3.8  AST 18 22  ALT 28 29  ALKPHOS 88 62  BILITOT 0.7 1.0   Lipids No results for input(s): "CHOL", "TRIG", "HDL", "LABVLDL", "LDLCALC", "CHOLHDL" in the last 168 hours.  Hematology Recent Labs  Lab 06/04/23 1145 06/07/23 0823 06/07/23 1905  WBC 4.3  --  4.0  RBC 3.51*  --  3.21*  HGB 11.5* 9.5*  9.5* 11.0*  HCT 33.4* 28.0*  28.0* 31.4*  MCV 95  --  97.8  MCH 32.8  --  34.3*  MCHC 34.4  --  35.0  RDW 12.5  --  13.2  PLT 167  --  144*   Thyroid  Recent Labs  Lab 06/04/23 1145 06/07/23 1905  TSH 1.090 1.137  FREET4 1.58  --     BNP Recent Labs  Lab 06/07/23 1905  BNP 312.6*    DDimer No results for input(s): "DDIMER" in the last 168 hours.   Radiology/Studies:  CARDIAC CATHETERIZATION  Result Date: 06/07/2023   Mid LAD lesion is 30% stenosed. 1. Normal PCWP and LVEDP, minimally elevated RA pressure. 2. Preserved cardiac output. 3. Nonobstructive mild CAD.   CUP PACEART INCLINIC DEVICE CHECK  Result Date: 06/04/2023 ICD check in clinic. Normal device function. Thresholds and sensing consistent with previous device measurements. Impedance trends stable over time. No mode switches. 4 VHR detections, EGMs consistent with VT.  Longest VT lasted 2 minutes 24 seconds, rates as slow as 140 bpm and below treatment zone. Histogram distribution appropriate for patient and level of activity. No changes made this session. Device programmed at appropriate safety margins. Estimated longevity 73yr65mo. Pt enrolled in remote follow-up. Patient education completed including shock plan. Auditory/vibratory alert demonstrated.  Set up for [facility]    Assessment and Plan:   MMVT below detection Has: VT  zone that starts at 178 VF starts at >188  Started on amiodarone gtt last night with his recurrent and sustained VT  QT looks OK Keep electrolytes adequately replaced, K+ has been ordered/replaced today Perhaps add on BB  Might need to consider EP/ablation  2. Paroxysmal AFib CHA2DS2Vasc is 2, on xarelto   EP MD will see once out of procedure  ADDEND: Dr. Nelly Laurence has seen the patient Patient mentions acute increase in personal stressor, Mom passed away 2 weeks ago, his brother had an acute heart related hospitalization, thinks perhaps this may be contributing. Recommend Toprol 25mg  daily OK to discharge from EP perspective when felt ready to go by HF team. Would continue amiodarone gtt until time of discharge > resume home dose on discharge EP follow up is in place Pt is aware no driving 6 mo   Risk Assessment/Risk Scores:    For questions or updates, please contact [facility] Please consult [email or web address] for contact info under    Signed, Sheilah Pigeon, PA-C  06/08/2023 10:37 AM

## 2023-06-11 ENCOUNTER — Telehealth: Payer: Self-pay

## 2023-06-11 NOTE — Telephone Encounter (Signed)
Following alert received from [employer] received for sustained VT in the VF zone with successful HV therapy. Event occurred 9/20 @ 21:52, duration 19sec, HR 214.  EGM shows sustained VT unchanged with one burst of ATP, HV therapy delivered 35J converting arrhythmia There are 6 additional VT episodes and 1 NSVT.  Patient reports he was d/c from hospital earlier and laying in bed with no symptoms when he received a shock. Has an upcoming apt with Dr. Graciela Husbands 06/18/23 (pt aware). Patient does not drive although did make aware of no driving x6 months per [place] DMV and shock plan.   Routing to Dr. Graciela Husbands per protocol.

## 2023-06-11 NOTE — Transitions of Care (Post Inpatient/ED Visit) (Signed)
06/11/2023  Name: Hernandez Sauro MRN: [id number] DOB: 02-22-1961  Today's TOC FU Call Status: Today's TOC FU Call Status:: Unsuccessful Call (1st Attempt) Unsuccessful Call (1st Attempt) Date: 06/11/23  Attempted to reach the patient regarding the most recent Inpatient/ED visit.  Follow Up Plan: Additional outreach attempts will be made to reach the patient to complete the Transitions of Care (Post Inpatient/ED visit) call.   Signature Robyne Peers, RN

## 2023-06-12 ENCOUNTER — Telehealth: Payer: Self-pay

## 2023-06-12 ENCOUNTER — Other Ambulatory Visit (HOSPITAL_COMMUNITY): Payer: Self-pay | Admitting: Cardiology

## 2023-06-12 LAB — LIPOPROTEIN A (LPA): Lipoprotein (a): 127.7 nmol/L — ABNORMAL HIGH (ref <75.0)

## 2023-06-12 MED ORDER — ALBUTEROL SULFATE HFA 108 (90 BASE) MCG/ACT IN AERS: 1.0000 | INHALATION_SPRAY | Freq: Four times a day (QID) | RESPIRATORY_TRACT | 0 refills | Status: DC | PRN

## 2023-06-12 NOTE — Transitions of Care (Post Inpatient/ED Visit) (Signed)
06/12/2023  Name: [name] [name] MRN: [id number] DOB: 11-07-1960  Today's TOC FU Call Status: Today's TOC FU Call Status:: Successful TOC FU Call Completed Unsuccessful Call (1st Attempt) Date: 06/11/23 [name] [name] FU Call Complete Date: 06/12/23 Patient's Name and Date of Birth confirmed.  Transition Care Management Follow-up Telephone Call Date of Discharge: 06/08/23 Discharge Facility: [facility] Type of Discharge: Inpatient Admission Primary Inpatient Discharge Diagnosis:: VT How have you been since you were released from the [name]?: Better Any questions or concerns?: No  Items Reviewed: Did you receive and understand the discharge instructions provided?: No Medications obtained,verified, and reconciled?: Partial Review Completed Reason for Partial Mediation Review: He stated that he has all of his medications and did not have any questions about the med regime and did not need to review the med list. He said that he has been on potassium in the past but it is not listed on his med list. He wanted to know if he should be taking it. I instructed him to adhere to the list on the AVS and to check with his cardiologist before taking anything else. He said he understood and will talk to the cardiologist about it on Mon, 9/30. Any new allergies since your discharge?: No Dietary orders reviewed?: Yes Type of Diet Ordered:: heart healthy, low sodium. Do you have support at home?: Yes People in Home: significant other Name of Support/Comfort Primary Source: his girlfriend  Medications Reviewed Today: Medications Reviewed Today   Medications were not reviewed in this encounter     Home Care and Equipment/Supplies: Were Home Health Services Ordered?: No Any new equipment or medical supplies ordered?: No  Functional Questionnaire: Do you need assistance with bathing/showering or dressing?: No Do you need assistance with meal preparation?: No Do you need assistance with eating?: No Do  you have difficulty maintaining continence: No Do you need assistance with getting out of bed/getting out of a chair/moving?: No Do you have difficulty managing or taking your medications?: No  Follow up appointments reviewed: PCP Follow-up appointment confirmed?: Yes Date of PCP follow-up appointment?: 06/20/23 Follow-up Provider: Amy [name]. NP Specialist [name] Follow-up appointment confirmed?: Yes Date of Specialist follow-up appointment?: 06/18/23 Follow-Up Specialty Provider:: cardiology, 06/22/2023- [facility]. mulitple appointments scheduled for defub checks. Do you need transportation to your follow-up appointment?: No Do you understand care options if your condition(s) worsen?: Yes-patient verbalized understanding    SIGNATURE Robyne Peers, RN

## 2023-06-13 ENCOUNTER — Encounter (HOSPITAL_COMMUNITY): Payer: Self-pay | Admitting: Cardiology

## 2023-06-13 NOTE — Telephone Encounter (Signed)
Message to EP for device report

## 2023-06-13 NOTE — Telephone Encounter (Signed)
[name], MD  Magda Bernheim [place], [place] minutes ago (1:21 PM)    Agree, have device clinic call him.   Will forward to the device clinic.

## 2023-06-13 NOTE — Telephone Encounter (Signed)
Remote transmission received. Normal device function. No alerts triggered. Patient in normal rhythm at this time. Patient called made aware and forwarding back to Dr. Shirlee Latch who requested transmission.

## 2023-06-15 ENCOUNTER — Ambulatory Visit: Payer: Medicare HMO | Attending: Cardiovascular Disease

## 2023-06-15 VITALS — BP 118/70 | HR 80 | Wt 324.2 lb

## 2023-06-15 DIAGNOSIS — I472 Ventricular tachycardia, unspecified: Secondary | ICD-10-CM

## 2023-06-15 NOTE — Patient Instructions (Signed)
Medication Instructions:  Your physician recommends that you continue on your current medications as directed. Please refer to the Current Medication list given to you today.  *If you need a refill on your cardiac medications before your next appointment, please call your pharmacy*   Lab Work: None ordered.  If you have labs (blood work) drawn today and your tests are completely normal, you will receive your results only by: MyChart Message (if you have MyChart) OR A paper copy in the mail If you have any lab test that is abnormal or we need to change your treatment, we will call you to review the results.   Testing/Procedures: None ordered.    Follow-Up: At [facility], you and your health needs are our priority.  As part of our continuing mission to provide you with exceptional heart care, we have created designated Provider Care Teams.  These Care Teams include your primary Cardiologist (physician) and Advanced Practice Providers (APPs -  Physician Assistants and Nurse Practitioners) who all work together to provide you with the care you need, when you need it.  We recommend signing up for the patient portal called "MyChart".  Sign up information is provided on this After Visit Summary.  MyChart is used to connect with patients for Virtual Visits (Telemedicine).  Patients are able to view lab/test results, encounter notes, upcoming appointments, etc.  Non-urgent messages can be sent to your provider as well.   To learn more about what you can do with MyChart, go to [email or web address].    Your next appointment:   As scheduled with Dr Graciela Husbands 06/18/23

## 2023-06-15 NOTE — Progress Notes (Signed)
Nurse Visit   Date of Encounter: 06/15/2023 ID: [name] [name], DOB January 10, 1961, MRN [id number]  PCP:  [name], NP   [facility] Providers Cardiologist:  Marca Ancona, MD Electrophysiologist:  Sherryl Manges, MD  Advanced Heart Failure:  Arvilla Meres, MD      Visit Details   VS:  BP 118/70   Pulse 80   Wt (!) 324 lb 3.2 oz (147.1 kg)   BMI 38.44 kg/m  , BMI Body mass index is 38.44 kg/m.  Wt Readings from Last 3 Encounters:  06/15/23 (!) 324 lb 3.2 oz (147.1 kg)  06/08/23 (!) 318 lb (144.2 kg)  06/04/23 (!) 324 lb (147 kg)     Reason for visit: EKG due to starting Sertraline and taking Sotalol Performed today: Vital signs, EKG and consulted with Dr Nelly Laurence, DOD Changes (medications, testing, etc.) : none Length of Visit: 40 minutes    Medications Adjustments/Labs and Tests Ordered: Orders Placed This Encounter  Procedures   EKG 12-Lead   No orders of the defined types were placed in this encounter. Pt presents today for EKG due to being started on Sertraline and being on Sotalol.  Pt states he has stopped Sertraline and is not certain he wishes to continue due to a previous side effect with another SSRI and he also had a day he did not feel well after hospital discharge.  He felt it may have been related to the Sertraline.  Dr Nelly Laurence made aware. Pt has a hospital f/u appointment with Dr Graciela Husbands on 06/18/2023 and will plan to keep that appt.  Reviewed ED precautions.    Signed, Alois Cliche, RN  06/15/2023 12:19 PM

## 2023-06-18 ENCOUNTER — Encounter: Payer: Self-pay | Admitting: Internal Medicine

## 2023-06-18 ENCOUNTER — Ambulatory Visit: Payer: Medicare HMO | Attending: Internal Medicine | Admitting: Internal Medicine

## 2023-06-18 VITALS — BP 118/68 | HR 84 | Ht 77.0 in | Wt 325.2 lb

## 2023-06-18 DIAGNOSIS — I472 Ventricular tachycardia, unspecified: Secondary | ICD-10-CM

## 2023-06-18 DIAGNOSIS — I4891 Unspecified atrial fibrillation: Secondary | ICD-10-CM | POA: Diagnosis not present

## 2023-06-18 DIAGNOSIS — Z79899 Other long term (current) drug therapy: Secondary | ICD-10-CM | POA: Diagnosis not present

## 2023-06-18 DIAGNOSIS — I493 Ventricular premature depolarization: Secondary | ICD-10-CM | POA: Diagnosis not present

## 2023-06-18 DIAGNOSIS — I428 Other cardiomyopathies: Secondary | ICD-10-CM

## 2023-06-18 DIAGNOSIS — I5022 Chronic systolic (congestive) heart failure: Secondary | ICD-10-CM | POA: Diagnosis not present

## 2023-06-18 DIAGNOSIS — Z95810 Presence of automatic (implantable) cardiac defibrillator: Secondary | ICD-10-CM | POA: Diagnosis not present

## 2023-06-18 MED ORDER — POTASSIUM CHLORIDE CRYS ER 20 MEQ PO TBCR: 20.0000 meq | EXTENDED_RELEASE_TABLET | Freq: Every day | ORAL | 3 refills | Status: DC

## 2023-06-18 MED ORDER — RANOLAZINE ER 500 MG PO TB12: 500.0000 mg | ORAL_TABLET | Freq: Two times a day (BID) | ORAL | 3 refills | Status: DC

## 2023-06-18 NOTE — Patient Instructions (Addendum)
Medication Instructions:  Your physician has recommended you make the following change in your medication:   ** Begin Ranolazine 500mg  - 1 tablet by mouth twice daily.  ** Potassium daily  *If you need a refill on your cardiac medications before your next appointment, please call your pharmacy*   Lab Work: BMET 2 -3 weeks  If you have labs (blood work) drawn today and your tests are completely normal, you will receive your results only by: MyChart Message (if you have MyChart) OR A paper copy in the mail If you have any lab test that is abnormal or we need to change your treatment, we will call you to review the results.   Testing/Procedures: None ordered.    Follow-Up: At [facility], you and your health needs are our priority.  As part of our continuing mission to provide you with exceptional heart care, we have created designated Provider Care Teams.  These Care Teams include your primary Cardiologist (physician) and Advanced Practice Providers (APPs -  Physician Assistants and Nurse Practitioners) who all work together to provide you with the care you need, when you need it.  We recommend signing up for the patient portal called "MyChart".  Sign up information is provided on this After Visit Summary.  MyChart is used to connect with patients for Virtual Visits (Telemedicine).  Patients are able to view lab/test results, encounter notes, upcoming appointments, etc.  Non-urgent messages can be sent to your provider as well.   To learn more about what you can do with MyChart, go to [email or web address].    Your next appointment:   3 months with Dr Graciela Husbands as scheduled

## 2023-06-18 NOTE — Progress Notes (Signed)
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    LV programed subtherapeutic with a QRS duration of 140 ms.  Because of RBBB AND PROGRAMed FUSION PATIENT>>  Dyspnea is better since the reprogramming. GEN change 6/23  Atrial fibrillation and polymorphic ventricular tachycardia Rx w amiodarone therapy.     Recurrent ventricular tachycardia 9/18 potassium levels were normal at that time.   Amiodarone was continued and sotalol was initiated.  Intercurrent ventricular tachycardia treated with ATP ; 9/24 hospitalization for recurrent shock Rx VT; Metoprolol uptitrated  Interval shock for ATP failure as well as an interval episode of 3 minutes of ventricular tachycardia  Quite anxious.  The patient denies chest pain, shortness of breath, nocturnal dyspnea, orthopnea or peripheral edema.  There have been no palpitations, lightheadedness or syncope.     Date Cr K Mg TSH LFTs Hgb PFTs  9/18  1.02 3.8 2.3         11/18     0.860 36 11.0    12/22 1.18 3.9 2.3 (5/22) 1.085 16 11.8   12/23 1.25<<2.6 4.1    11.2   9/24 1.27<<1.6 3.5 2.1 1.137 29 11.0     DATE TEST EF   12/21 Echo 20-25 % LV global hypokinesis and hypertrophy, G1 diastolic dysfunction  12/22 Echo 20-25%   4/23 PYP  Equivocal   9/24  L/RHC  No obstruc CAD Filling press normal CO preserved   9/24 Echo   20-25%       Past Medical History:   Diagnosis Date   AICD (automatic cardioverter/defibrillator) present    Arthritis    Atrial fibrillation (HCC)    CAD (coronary artery disease)    Mild nonobstructive (Cath 09)   Chronic systolic heart failure (HCC)    Nonischemic CM: echo 4/12 with EF 25-30%, grade 2 diast dysfxn, mild dilated aortic root 43 mm, trivial MR, mod LAE   COPD (chronic obstructive pulmonary disease) (HCC)    Dyspnea    with exertion    Ejection fraction < 50% 08/2019   20-25% noted on ECHO   H/O ETOH abuse    HTN (hypertension)    Hypersomnia    Nonischemic cardiomyopathy (HCC)    Obesity, morbid (HCC)    OSA on CPAP    Systolic and diastolic CHF, acute on chronic (HCC) 11/2016   Tobacco use disorder    Ventricular tachycardia (HCC)     Past Surgical History:  Procedure Laterality Date   CARDIAC CATHETERIZATION N/A 04/08/2015   Procedure: Right/Left Heart Cath and Coronary Angiography;  Surgeon: Corky Crafts, MD;  Location: [facility] INVASIVE CV LAB;  Service: Cardiovascular;  Laterality: N/A;   EP IMPLANTABLE DEVICE N/A 04/14/2015   Procedure: BiV ICD Insertion CRT-D;  Surgeon: [name], MD;  Location: [facility] INVASIVE CV LAB;  Service: Cardiovascular;  Laterality: N/A;   HYDROCELE EXCISION / REPAIR     ICD GENERATOR CHANGEOUT N/A  02/20/2022   Procedure: ICD GENERATOR CHANGEOUT;  Surgeon: [name], MD;  Location: [facility] INVASIVE CV LAB;  Service: Cardiovascular;  Laterality: N/A;   ICD IMPLANT     RIGHT/LEFT HEART CATH AND CORONARY ANGIOGRAPHY N/A 06/07/2023   Procedure: RIGHT/LEFT HEART CATH AND CORONARY ANGIOGRAPHY;  Surgeon: [name], MD;  Location: [facility] INVASIVE CV LAB;  Service: Cardiovascular;  Laterality: N/A;   TOTAL HIP ARTHROPLASTY Right 11/04/2018   Procedure: TOTAL HIP ARTHROPLASTY ANTERIOR APPROACH;  Surgeon: [name], MD;  Location: [facility] ORS;  Service: Orthopedics;  Laterality: Right;   TOTAL KNEE ARTHROPLASTY Right 01/30/2020   Procedure: TOTAL KNEE ARTHROPLASTY;  Surgeon:  [name], MD;  Location: [facility] OR[facility];  Service: Orthopedics;  Laterality: Right;   TOTAL KNEE ARTHROPLASTY Left 07/30/2020   Procedure: TOTAL KNEE ARTHROPLASTY;  Surgeon: [name], MD;  Location: [facility] OR[facility];  Service: Orthopedics;  Laterality: Left;   TUMOR EXCISION Left    TUMOR EXCISION Left 1982   Benign 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 18 g 0   amiodarone (PACERONE) 200 MG tablet Take 1 tablet (200 mg total) by mouth 2 (two) times daily. 90 tablet 3   Ascorbic Acid (VITAMIN C) 100 MG tablet Take 100 mg by mouth daily.     atorvastatin (LIPITOR) 80 MG tablet TAKE 1 TABLET EVERY EVENING 90 tablet 3   ibuprofen (ADVIL) 200 MG tablet Take 400 mg by mouth every 8 (eight) hours as needed for moderate pain.     metoprolol succinate (TOPROL-XL) 25 MG 24 hr tablet Take 1 tablet (25 mg total) by mouth daily. 30 tablet 5   Multiple Vitamins-Minerals (MULTIVITAMIN WITH MINERALS) tablet Take 1 tablet by mouth daily.     OVER THE COUNTER MEDICATION Take 1 tablet by mouth daily. Healthy eye care     ranolazine (RANEXA) 500 MG 12 hr tablet Take 1 tablet (500 mg total) by mouth 2 (two) times daily. 180 tablet 3   sacubitril-valsartan (ENTRESTO) 49-51 MG Take 1 tablet by mouth 2 (two) times daily. 180 tablet 3   sertraline (ZOLOFT) 25 MG tablet Take 1 tablet (25 mg total) by mouth daily. 30 tablet 1   sotalol (BETAPACE) 80 MG tablet Take 1 tablet (80 mg total) by mouth 2 (two) times daily. 60 tablet 3   spironolactone (ALDACTONE) 25 MG tablet TAKE 1 TABLET EVERY DAY 90 tablet 3   torsemide (DEMADEX) 20 MG tablet TAKE 3 TABLETS EVERY MORNING AND TAKE 2 TABLETS EVERY EVENING 450 tablet 3   XARELTO 20 MG TABS tablet TAKE 1 TABLET DAILY WITH SUPPER. 90 tablet 3   No current  facility-administered medications for this visit.    Allergies  Allergen Reactions   Sglt2 Inhibitors Other (See Comments)    Recurrent UTI      Review of Systems negative except from HPI and PMH  Physical Exam BP 118/68 (BP Location: Left Arm, Patient Position: Sitting, Cuff Size: Large)   Pulse 84   Ht 6\' 5"  (1.956 m)   Wt (!) 325 lb 3.2 oz (147.5 kg)   SpO2 96%   BMI 38.56 kg/m  Well developed and well nourished in no acute distress HENT normal Neck supple with JVP-flat Clear Device pocket well healed; without hematoma  or erythema.  There is no tethering  Regular rate and rhythm, no  gallop No  murmur Abd-soft with active BS No Clubbing cyanosis  edema Skin-warm and dry A & Oriented  Grossly normal sensory and motor function  ECG AV pacing with ventricular pacing trying to create some degree of QRS narrowing in the context of right bundle branch block frequent PVCs intrinsicoid deflection about 100 ms  Device function is normal. Programming changes VT detection was decreased from 180 to 160 ms ATP was added See Paceart for details    12/18 QRS Rs V1 and negative lead I QRSd approximately 200 ms  1/20 QRS upright lead I and negative lead V1 QRSd 158 ms 12/20 QRS negative lead I and upright V1 QRSd 218 ms 12/21 QRS negative lead V1 upright lead I QRSd 142 ms 8/22 QRS negative lead V1 upright lead I QRSd 140 ms  Assessment and  Plan  Nonischemic cardiomyopathy  Right bundle branch block  Congestive heart failure-chronic-systolic  Ventricular tachycardia-polymorphic  Implantable defibrillator-CRT-[employer]  PVCs  Atrial fibrillation-paroxysmal  Amiodarone therapy for the above  Sinus node dysfunction with chronotropic incompetence  SGLT2 intolerance  Recurrent ventricular tachycardia, drug refractory.  Will add ranolazine to augment antiarrhythmic benefit.   Catheter ablation was discussed, and I have reached out to Dr Berneice Gandy continue the  amiodarone at 200 twice daily and the sotalol  Will add low-dose potassium given his diuretics.  Will plan metabolic profile in about 2 to 3 weeks.  In this regard renal function was a little bit better from last time.  He is euvolemic.   With his cardiomyopathy continue Entresto and spironolactone.  The device ended previously programmed at a lower rate of 80 to  attempt atrial overdrive.  Not sure if been successful, still more than 7% PVCs; will continue for right now but cannot sustain this for a long period of time         .

## 2023-06-19 LAB — CUP PACEART INCLINIC DEVICE CHECK
Date Time Interrogation Session: 20240930094855
Implantable Lead Connection Status: 753985
Implantable Lead Connection Status: 753985
Implantable Lead Connection Status: 753985
Implantable Lead Implant Date: 20160727
Implantable Lead Implant Date: 20160727
Implantable Lead Implant Date: 20160727
Implantable Lead Location: 753858
Implantable Lead Location: 753859
Implantable Lead Location: 753860
Implantable Lead Model: 4598
Implantable Lead Model: 5076
Implantable Lead Model: 6935
Implantable Pulse Generator Implant Date: 20230605

## 2023-06-20 ENCOUNTER — Encounter: Payer: Self-pay | Admitting: Family

## 2023-06-20 ENCOUNTER — Ambulatory Visit (INDEPENDENT_AMBULATORY_CARE_PROVIDER_SITE_OTHER): Payer: Medicare HMO | Admitting: Family

## 2023-06-20 VITALS — BP 104/70 | HR 85 | Temp 98.9°F | Ht 77.0 in | Wt 324.6 lb

## 2023-06-20 DIAGNOSIS — I472 Ventricular tachycardia, unspecified: Secondary | ICD-10-CM | POA: Diagnosis not present

## 2023-06-20 DIAGNOSIS — F1010 Alcohol abuse, uncomplicated: Secondary | ICD-10-CM

## 2023-06-20 DIAGNOSIS — I480 Paroxysmal atrial fibrillation: Secondary | ICD-10-CM

## 2023-06-20 DIAGNOSIS — I5022 Chronic systolic (congestive) heart failure: Secondary | ICD-10-CM | POA: Diagnosis not present

## 2023-06-20 DIAGNOSIS — F32A Depression, unspecified: Secondary | ICD-10-CM | POA: Diagnosis not present

## 2023-06-20 DIAGNOSIS — Z09 Encounter for follow-up examination after completed treatment for conditions other than malignant neoplasm: Secondary | ICD-10-CM

## 2023-06-20 DIAGNOSIS — F419 Anxiety disorder, unspecified: Secondary | ICD-10-CM

## 2023-06-20 DIAGNOSIS — G4733 Obstructive sleep apnea (adult) (pediatric): Secondary | ICD-10-CM | POA: Diagnosis not present

## 2023-06-20 NOTE — Progress Notes (Unsigned)
Patient ID: [name] [name], male    DOB: 1961-08-17  MRN: [id number]  CC: Hospital Discharge Follow-Up  Subjective: [name] [name] is a 62 y.o. male who presents for hospital discharge follow-up.   His concerns today include:  06/07/2023 - 06/08/2023 [facility] per MD note:  Hospital Course:   62 y.o. male with a past medical history of chronic systolic CHF (EF 41% in April 6606), dilated NICM s/p BiV ICD, PAF with intermittent compliance with Xarelto, ETOH abuse, tobacco abuse, OSA on CPAP, VT, and HTN.    He has had 2 episodes of VT with ICD shock in the last week. Brought in for diagnostic [facility] and EP evaluation. RHC/LHC showed nonobstructive mild CAD, preserved cardiac output, and relatively normal filling pressures. He was admitted for overnight observation and EP consultation. Overnight, he had another 3 min run of VT. Rate too slow for ICD therapies. Self-terminated. EP felt that acute emotional stressors (death of his mother) may be possible trigger. They recommended continuation of PO amiodarone, sotalol and added Metoprolol XL, 25 mg daily, as an adjunct. HF GDMT and home diuretic regimen continued. Given depression and anxiety, he was started on Zoloft.    On 9/20, he was last seen and examined by Dr. Shirlee Latch and felt stable for d/c home. EP team also deemed ok for d/c. He has close clinic f/us arranged in both EP and the [facility].    See detailed hospital problem list below   1. VT: Has been chronically on amiodarone with addition of sotalol to lower DFT.  QTc 511 msec today (paced).  He has had 2 out of hospital episodes of VT with ICD shock in the last week.  Cath this admit showed optimized filling pressures and preserved cardiac output, minimal CAD.  Had recurrent VT overnight. EP increased baseline pacing rate to 80. Started on amio gtt. Back in SR, V paced on tele. - continue IV amio load. Keep K > 4.0 and Mg > 2.0 - formal EP consult today for further guidance   2. Chronic  systolic CHF: NICM, Echo 12/2016 EF 15%. Normal coronaries on cath in July 2016. Medtronic CRT-D device. ETOH vs HTN vs myocarditis as cause of cardiomyopathy. Echo 12/22 showed that EF remains 20-25%.  PYP scan not suggestive of TTR cardiac amyloidosis.  RHC/LHC yesterday showed nonobstructive mild CAD, preserved cardiac output, and relatively normal filling pressures.  NYHA class II symptoms, at baseline currently.   - Continue torsemide 60 mg daily alternating with 40 mg every other day.  - Continue Entresto 49/51 mg bid. - Continue spironolactone 25 mg daily.  - Intolerant to Coreg in the past with hypotension, he is on sotalol and amiodarone.  - He stopped Jardiance after 3 UTIs, I will not start back.  3. Paroxysmal atrial fibrillation: He is in NSR today.  - Continue amiodarone. LFTs/TSH checked on admit and WNL  - Continue Xarelto 20 mg daily.   4. ETOH abuse: Suspect this contributes to cardiomyopathy.  Still drinking moderately, I have encouraged him to cut back.  5. OSA: Use CPAP.  6. Depression/Anxiety: lots of life stressors + mother recently passed away. He is asking for pharmacological therapy - added Zoloft   Follow-Ups: Follow up with [facility] (Cardiology); 06/22/23 at 11:30 AM  Hospital Follow -up in [facility]  Medication List at Discharge   Albuterol Sulfate 108 (90 Base) MCG/ACT 1 puff Inhalation Every 6 hours PRN  Amiodarone HCl 200 mg Oral 2 times daily  Ascorbic Acid 100 mg Oral Daily  Atorvastatin Calcium 80 mg Oral Every evening  Ibuprofen 400 mg Oral Every 8 hours PRN  Metoprolol Succinate 25 mg Oral Daily  Multiple Vitamins-Minerals 1 tablet Oral Daily  OVER THE COUNTER MEDICATION 1 tablet Oral Daily, Healthy eye care  Rivaroxaban 20 MG TAKE 1 TABLET DAILY WITH SUPPER.  Sacubitril-Valsartan 49-51 MG 1 tablet Oral 2 times daily  Sertraline HCl 25 mg Oral Daily  Sotalol HCl 80 mg  Oral 2 times daily  Spironolactone 25 mg Oral Daily  Torsemide 20 MG TAKE 3 TABLETS EVERY MORNING AND TAKE 2 TABLETS EVERY EVENING   06/18/2023 [facility] per MD note: Assessment and  Plan   Nonischemic cardiomyopathy   Right bundle branch block   Congestive heart failure-chronic-systolic   Ventricular tachycardia-polymorphic   Implantable defibrillator-CRT-Medtronic   PVCs   Atrial fibrillation-paroxysmal   Amiodarone therapy for the above   Sinus node dysfunction with chronotropic incompetence   SGLT2 intolerance   Recurrent ventricular tachycardia, drug refractory.  Will add ranolazine to augment antiarrhythmic benefit.   Catheter ablation was discussed, and I have reached out to Dr Berneice Gandy continue the amiodarone at 200 twice daily and the sotalol   Will add low-dose potassium given his diuretics.  Will plan metabolic profile in about 2 to 3 weeks.  In this regard renal function was a little bit better from last time.  He is euvolemic.   With his cardiomyopathy continue Entresto and spironolactone.   The device ended previously programmed at a lower rate of 80 to  attempt atrial overdrive.  Not sure if been successful, still more than 7% PVCs; will continue for right now but cannot sustain this for a long period of time    Today's office visit 06/20/2023:  Patient Active Problem List   Diagnosis Date Noted   RBBB 10/26/2022   Sinus node dysfunction (HCC) 12/30/2021   PVC's (premature ventricular contractions) 12/30/2021   Primary osteoarthritis of left knee 07/30/2020   Status post total left knee replacement 07/30/2020   Status post total knee replacement, left 07/30/2020   Ventricular tachycardia (HCC) 04/17/2020   Primary osteoarthritis of right knee 01/30/2020   Hypokalemia 11/23/2019   Primary osteoarthritis of right hip 11/03/2018   VT (ventricular tachycardia) (HCC) 05/30/2017   AKI (acute kidney injury) (HCC)    Obesity  12/25/2016   OSA (obstructive sleep apnea) 12/25/2016   Hyperglycemia 12/25/2016   Acute respiratory failure with hypoxia (HCC) 12/25/2016   Chronic anticoagulation 06/27/2016   Encounter for therapeutic drug monitoring 04/19/2015   S/P ICD (internal cardiac defibrillator) procedure Medtronic CRT ICD 04/15/2015   Atrial fibrillation with RVR - Paroxysmal 04/08/2015   NICM 04/06/2015   Troponin level elevated 04/06/2015   Rectal bleeding 10/26/2011   Hydrocele, left 05/24/2011   Tobacco use disorder- 1/2 pk a day    Alcohol abuse    Obesity 09/27/2008   CAD- minor CAD 2009 and 2016 09/27/2008   VENTRICULAR TACHYCARDIA 09/27/2008   Essential hypertension 06/19/2008   Chronic systolic heart failure (HCC) 06/19/2008     Current Outpatient Medications on File Prior to Visit  Medication Sig Dispense Refill   albuterol (VENTOLIN HFA) 108 (90 Base) MCG/ACT inhaler Inhale 1 puff into the lungs every 6 (six) hours as needed for wheezing or shortness of breath. 18 g 0   amiodarone (PACERONE) 200 MG tablet Take 1 tablet (200 mg total) by  mouth 2 (two) times daily. 90 tablet 3   Ascorbic Acid (VITAMIN C) 100 MG tablet Take 100 mg by mouth daily.     atorvastatin (LIPITOR) 80 MG tablet TAKE 1 TABLET EVERY EVENING 90 tablet 3   ibuprofen (ADVIL) 200 MG tablet Take 400 mg by mouth every 8 (eight) hours as needed for moderate pain.     metoprolol succinate (TOPROL-XL) 25 MG 24 hr tablet Take 1 tablet (25 mg total) by mouth daily. 30 tablet 5   Multiple Vitamins-Minerals (MULTIVITAMIN WITH MINERALS) tablet Take 1 tablet by mouth daily.     OVER THE COUNTER MEDICATION Take 1 tablet by mouth daily. Healthy eye care     potassium chloride SA (KLOR-CON M) 20 MEQ tablet Take 1 tablet (20 mEq total) by mouth daily. 90 tablet 3   ranolazine (RANEXA) 500 MG 12 hr tablet Take 1 tablet (500 mg total) by mouth 2 (two) times daily. 180 tablet 3   sacubitril-valsartan (ENTRESTO) 49-51 MG Take 1 tablet by mouth 2  (two) times daily. 180 tablet 3   sertraline (ZOLOFT) 25 MG tablet Take 1 tablet (25 mg total) by mouth daily. 30 tablet 1   sotalol (BETAPACE) 80 MG tablet Take 1 tablet (80 mg total) by mouth 2 (two) times daily. 60 tablet 3   spironolactone (ALDACTONE) 25 MG tablet TAKE 1 TABLET EVERY DAY 90 tablet 3   torsemide (DEMADEX) 20 MG tablet TAKE 3 TABLETS EVERY MORNING AND TAKE 2 TABLETS EVERY EVENING 450 tablet 3   XARELTO 20 MG TABS tablet TAKE 1 TABLET DAILY WITH SUPPER. 90 tablet 3   No current facility-administered medications on file prior to visit.    Allergies  Allergen Reactions   Sglt2 Inhibitors Other (See Comments)    Recurrent UTI    Social History   Socioeconomic History   Marital status: Single    Spouse name: Not on file   Number of children: 2   Years of education: Not on file   Highest education level: Not on file  Occupational History   Occupation: scrap metal   Occupation: disabled, odd jobs  Tobacco Use   Smoking status: Former    Current packs/day: 0.00    Average packs/day: 0.5 packs/day for 4.0 years (2.0 ttl pk-yrs)    Types: Cigarettes    Start date: 10/29/2001    Quit date: 10/29/2005    Years since quitting: 17.6   Smokeless tobacco: Never   Tobacco comments:    about a pack a week. social  Vaping Use   Vaping status: Never Used  Substance and Sexual Activity   Alcohol use: Yes    Comment: 12 beers per week    Drug use: Not Currently    Types: Marijuana   Sexual activity: Yes    Partners: Female  Other Topics Concern   Not on file  Social History Narrative   ** Merged History Encounter **       Social Determinants of Health   Financial Resource Strain: Low Risk  (05/15/2021)   Overall Financial Resource Strain (CARDIA)    Difficulty of Paying Living Expenses: Not hard at all  Food Insecurity: No Food Insecurity (06/07/2023)   Hunger Vital Sign    Worried About Running Out of Food in the Last Year: Never true    Ran Out of Food in the  Last Year: Never true  Transportation Needs: No Transportation Needs (06/07/2023)   PRAPARE - Administrator, Civil Service (Medical):  No    Lack of Transportation (Non-Medical): No  Physical Activity: Insufficiently Active (05/15/2021)   Exercise Vital Sign    Days of Exercise per Week: 2 days    Minutes of Exercise per Session: 20 min  Stress: No Stress Concern Present (05/15/2021)   [employer] of Occupational Health - Occupational Stress Questionnaire    Feeling of Stress : Not at all  Social Connections: Moderately Isolated (05/15/2021)   Social Connection and Isolation Panel [NHANES]    Frequency of Communication with Friends and Family: More than three times a week    Frequency of Social Gatherings with Friends and Family: More than three times a week    Attends Religious Services: 1 to 4 times per year    Active Member of [employer] or Organizations: No    Attends Banker Meetings: Never    Marital Status: Divorced  Catering manager Violence: Not At Risk (06/07/2023)   Humiliation, Afraid, Rape, and Kick questionnaire    Fear of Current or Ex-Partner: No    Emotionally Abused: No    Physically Abused: No    Sexually Abused: No    Family History  Problem Relation Age of Onset   Other Mother        cardiac surgery. late 1990s   Heart Problems Mother        CABG AGE 70   Congestive Heart Failure Father    Healthy Father        AGE 38   Coronary artery disease Other    Healthy Brother        AGE 20   Healthy Sister        AGE 5   Healthy Sister        AGE 8   Healthy Son    Healthy Son    Healthy Daughter     Past Surgical History:  Procedure Laterality Date   CARDIAC CATHETERIZATION N/A 04/08/2015   Procedure: Right/Left Heart Cath and Coronary Angiography;  Surgeon: Corky Crafts, MD;  Location: [facility] INVASIVE CV LAB;  Service: Cardiovascular;  Laterality: N/A;   EP IMPLANTABLE DEVICE N/A 04/14/2015   Procedure: BiV ICD Insertion  CRT-D;  Surgeon: [name], MD;  Location: [facility] INVASIVE CV LAB;  Service: Cardiovascular;  Laterality: N/A;   HYDROCELE EXCISION / REPAIR     ICD GENERATOR CHANGEOUT N/A 02/20/2022   Procedure: ICD GENERATOR CHANGEOUT;  Surgeon: [name], MD;  Location: [facility] INVASIVE CV LAB;  Service: Cardiovascular;  Laterality: N/A;   ICD IMPLANT     RIGHT/LEFT HEART CATH AND CORONARY ANGIOGRAPHY N/A 06/07/2023   Procedure: RIGHT/LEFT HEART CATH AND CORONARY ANGIOGRAPHY;  Surgeon: [name], MD;  Location: [facility] INVASIVE CV LAB;  Service: Cardiovascular;  Laterality: N/A;   TOTAL HIP ARTHROPLASTY Right 11/04/2018   Procedure: TOTAL HIP ARTHROPLASTY ANTERIOR APPROACH;  Surgeon: [name], MD;  Location: [facility];  Service: Orthopedics;  Laterality: Right;   TOTAL KNEE ARTHROPLASTY Right 01/30/2020   Procedure: TOTAL KNEE ARTHROPLASTY;  Surgeon: [name], MD;  Location: [facility];  Service: Orthopedics;  Laterality: Right;   TOTAL KNEE ARTHROPLASTY Left 07/30/2020   Procedure: TOTAL KNEE ARTHROPLASTY;  Surgeon: [name], MD;  Location: [facility];  Service: Orthopedics;  Laterality: Left;   TUMOR EXCISION Left    TUMOR EXCISION Left 1982   Benign tumor removed from L leg   VASECTOMY      ROS: Review of Systems Negative except as stated above  PHYSICAL  EXAM: There were no vitals taken for this visit.  Physical Exam  {male adult master:[id number]} {male adult master:[id number]}     Latest Ref Rng & Units 06/08/2023    8:40 AM 06/07/2023    7:05 PM 06/07/2023    8:23 AM  CMP  Glucose 70 - 99 mg/dL 829  562    BUN 6 - 20 mg/dL 18  22    Creatinine 1.30 - 1.24 mg/dL 8.65  7.84    Sodium 696 - 145 mmol/L 141  140  142    142   Potassium 3.5 - 5.1 mmol/L 3.5  3.5  3.9    3.8   Chloride 98 - 111 mmol/L 106  106    CO2 22 - 32 mmol/L 28  26    Calcium 8.9 - 10.3 mg/dL 8.8  9.1    Total Protein 6.5 - 8.1 g/dL  6.4    Total Bilirubin 0.3 - 1.2 mg/dL  1.0    Alkaline Phos 38 - 126 U/L  62     AST 15 - 41 U/L  22    ALT 0 - 44 U/L  29     Lipid Panel     Component Value Date/Time   CHOL 104 05/18/2021 1020   TRIG 69 05/18/2021 1020   HDL 40 (L) 05/18/2021 1020   CHOLHDL 2.6 05/18/2021 1020   VLDL 14 05/18/2021 1020   LDLCALC 50 05/18/2021 1020    CBC    Component Value Date/Time   WBC 4.0 06/07/2023 1905   RBC 3.21 (L) 06/07/2023 1905   HGB 11.0 (L) 06/07/2023 1905   HGB 11.5 (L) 06/04/2023 1145   HCT 31.4 (L) 06/07/2023 1905   HCT 33.4 (L) 06/04/2023 1145   PLT 144 (L) 06/07/2023 1905   PLT 167 06/04/2023 1145   MCV 97.8 06/07/2023 1905   MCV 95 06/04/2023 1145   MCH 34.3 (H) 06/07/2023 1905   MCHC 35.0 06/07/2023 1905   RDW 13.2 06/07/2023 1905   RDW 12.5 06/04/2023 1145   LYMPHSABS 0.9 06/07/2023 1905   LYMPHSABS 1.2 02/07/2022 0959   MONOABS 0.3 06/07/2023 1905   EOSABS 0.2 06/07/2023 1905   EOSABS 0.2 02/07/2022 0959   BASOSABS 0.0 06/07/2023 1905   BASOSABS 0.0 02/07/2022 0959    ASSESSMENT AND PLAN:  There are no diagnoses linked to this encounter.   Patient was given the opportunity to ask questions.  Patient verbalized understanding of the plan and was able to repeat key elements of the plan. Patient was given clear instructions to go to Emergency Department or return to medical center if symptoms don't improve, worsen, or new problems develop.The patient verbalized understanding.   No orders of the defined types were placed in this encounter.    Requested Prescriptions    No prescriptions requested or ordered in this encounter    No follow-ups on file.  [name], NP

## 2023-06-20 NOTE — Progress Notes (Unsigned)
Patient states he doesn't know why he is here.   Patient declined Flu vaccine.

## 2023-06-21 NOTE — Progress Notes (Signed)
ADVANCED HF CLINIC NOTE   Primary Care: Dr. Windle Guard, MD HF Cardiology: Dr. Shirlee Latch  HPI: [name] [name] is a 62 y.o. male with a past medical history of chronic systolic CHF  (EF 15% in April 2018), dilated NICM s/p BiV ICD, PAF with intermittent compliance with Xarelto, ETOH abuse, tobacco abuse, OSA on CPAP, and HTN.   Admitted 12/25/16-12/27/16 with acute on chronic systolic CHF, volume overload. He was diuresed 11 pounds on IV lasix.  Discharge weight was 294 pounds. It was felt that the etiology of his NICM was ETOH vs hypertension related.   Admitted 5/14-5/16/18 with volume overload, acute respiratory failure requiring Bipap. He was not taking his Entresto as he was confused about dosing and there was a mix up his pharmacy. He was diuresed with IV lasix, discharge weight was 295 pounds.   Admitted 05/30/17-06/02/17 with VT, ICD shock x 4. He is now on amiodarone and sotalol.  Echo in 12/20 showed EF 20-25%, severe LV dilation, moderate LVH, normal RV, small IVC.   He was admitted in 3/21 with ICD shock in setting of hypokalemia and heavy ETOH. Entresto was decreased with low BP.  He was diuresed due to volume overload.   He had left TKR in 11/21 with no complication.   Echo in 12/21 showed EF 20-25%, moderate LVH, normal RV, normal IVC.   Echo in 12/22 showed EF 20-25%, diffuse hypokinesis, mildly decreased RV systolic function.   Admitted 9/24 with VT. Was shocked by his ICD 9/13 and again 9/18. He was brought in for [facility] which showed well-compensated CHF and mild nonobstructive CAD. Suspected VT was 2/2 acute stressors (mom passed away and his brother had recently been hospitalized). Started toprol XL. No further VT.   Today he returns for post hospital follow up. Overall feeling good. Denies palpitations, CP, dizziness, edema, or PND/Orthopnea. No SOB. Appetite good. No fever or chills. Weight at home 320 pounds. SBP at home usually 112s-115s. Taking all medications. Smokes  marijuana every now and then. Drinks 2-3x a week 3-4 beers, has cut back . Stays active by working around the house.   ECG (personally reviewed): v-paced, 80 bpm   Medtronic device interrogation (personally reviewed): AT/AF 0.0 hr/day. VP 95.7%, optivol stable, thoracic impedance below threshold, activity 2.3 hr/day  Labs (9/18): K 3.8, creatinine 1.02 Labs (11/18): K 4.4, creatinine 1.19, TSH normal, LFTs normal Labs (11/05/18): K 4 Creatinine 1.05  Labs (12/20): K 4, creatinine 1.19 Labs (3/21): K 4.1, creatinine 1.05, hgb 10.8, LFTs normal, TSH normal Labs (11/21): K 4.7, creatinine 1.24, hgb 11.9 Labs (12/21): K 4, creatinine 1.05, TSH and LFTs normal Labs (5/22): K 4.8, creatinine 1.67 Labs (5/23): K 4.1, creatinine 1.32 Labs (10/23): K 4.9, creatinine 1.64 Labs (9/24): K 3.5, SCr 1.27  PMH: 1. Chronic systolic CHF: Medtronic CRT-D.  Nonischemic cardiomyopathy. Possibly ETOH-related.  - LHC (2016): No CAD.  - Echo (2016): EF 20-25%, diffuse hypokinesis.  - Echo (4/18): EF 15%, normal RV size and systolic function.  - Echo (11/18): EF 20%, mildly dilated LV with mild LVH, normal RV size with mildly decreased RV systolic function.  - Echo (12/20): EF 20-25%, severe LV dilation, moderate LVH, normal RV, small IVC.  - Echo (12/21): EF 20-25%, moderate LVH, normal RV, normal IVC. - Echo (12/22): EF 20-25%, diffuse hypokinesis, mildly decreased RV systolic function.  - PYP scan (4/23): grade 1, H/CL < 1.5 - LHC/RHC (9/24) showed well-compensated CHF and mild nonobstructive CAD 2. Atrial fibrillation: Paroxysmal.  3. OSA: On CPAP.  4. ETOH abuse: Prior heavy ETOH.  5. H/o VT 6. COPD: Prior smoker.  7. COVID-19 infection 7/22  ROS: All systems reviewed and negative except as per HPI.   Current Outpatient Medications  Medication Sig Dispense Refill   albuterol (VENTOLIN HFA) 108 (90 Base) MCG/ACT inhaler Inhale 1 puff into the lungs every 6 (six) hours as needed for wheezing or  shortness of breath. 18 g 0   amiodarone (PACERONE) 200 MG tablet Take 1 tablet (200 mg total) by mouth 2 (two) times daily. 90 tablet 3   Ascorbic Acid (VITAMIN C) 100 MG tablet Take 100 mg by mouth daily.     atorvastatin (LIPITOR) 80 MG tablet TAKE 1 TABLET EVERY EVENING 90 tablet 3   ibuprofen (ADVIL) 200 MG tablet Take 400 mg by mouth every 8 (eight) hours as needed for moderate pain.     metoprolol succinate (TOPROL-XL) 25 MG 24 hr tablet Take 1 tablet (25 mg total) by mouth daily. 30 tablet 5   Multiple Vitamins-Minerals (MULTIVITAMIN WITH MINERALS) tablet Take 1 tablet by mouth daily.     OVER THE COUNTER MEDICATION Take 1 tablet by mouth daily. Healthy eye care     potassium chloride SA (KLOR-CON M) 20 MEQ tablet Take 1 tablet (20 mEq total) by mouth daily. 90 tablet 3   ranolazine (RANEXA) 500 MG 12 hr tablet Take 1 tablet (500 mg total) by mouth 2 (two) times daily. 180 tablet 3   sacubitril-valsartan (ENTRESTO) 49-51 MG Take 1 tablet by mouth 2 (two) times daily. 180 tablet 3   sotalol (BETAPACE) 80 MG tablet Take 1 tablet (80 mg total) by mouth 2 (two) times daily. 60 tablet 3   spironolactone (ALDACTONE) 25 MG tablet TAKE 1 TABLET EVERY DAY 90 tablet 3   torsemide (DEMADEX) 20 MG tablet TAKE 3 TABLETS EVERY MORNING AND TAKE 2 TABLETS EVERY EVENING 450 tablet 3   XARELTO 20 MG TABS tablet TAKE 1 TABLET DAILY WITH SUPPER. 90 tablet 3   No current facility-administered medications for this encounter.   Allergies  Allergen Reactions   Sglt2 Inhibitors Other (See Comments)    Recurrent UTI    Social History   Socioeconomic History   Marital status: Single    Spouse name: Not on file   Number of children: 2   Years of education: Not on file   Highest education level: Not on file  Occupational History   Occupation: scrap metal   Occupation: disabled, odd jobs  Tobacco Use   Smoking status: Former    Current packs/day: 0.00    Average packs/day: 0.5 packs/day for 4.0 years  (2.0 ttl pk-yrs)    Types: Cigarettes    Start date: 10/29/2001    Quit date: 10/29/2005    Years since quitting: 17.6   Smokeless tobacco: Never   Tobacco comments:    about a pack a week. social  Vaping Use   Vaping status: Never Used  Substance and Sexual Activity   Alcohol use: Yes    Comment: 12 beers per week    Drug use: Not Currently    Types: Marijuana   Sexual activity: Yes    Partners: Female  Other Topics Concern   Not on file  Social History Narrative   ** Merged History Encounter **       Social Determinants of Health   Financial Resource Strain: Low Risk  (05/15/2021)   Overall Financial Resource Strain (CARDIA)  Difficulty of Paying Living Expenses: Not hard at all  Food Insecurity: No Food Insecurity (06/07/2023)   Hunger Vital Sign    Worried About Running Out of Food in the Last Year: Never true    Ran Out of Food in the Last Year: Never true  Transportation Needs: No Transportation Needs (06/07/2023)   PRAPARE - Administrator, Civil Service (Medical): No    Lack of Transportation (Non-Medical): No  Physical Activity: Insufficiently Active (05/15/2021)   Exercise Vital Sign    Days of Exercise per Week: 2 days    Minutes of Exercise per Session: 20 min  Stress: No Stress Concern Present (05/15/2021)   [employer] of Occupational Health - Occupational Stress Questionnaire    Feeling of Stress : Not at all  Social Connections: Moderately Isolated (05/15/2021)   Social Connection and Isolation Panel [NHANES]    Frequency of Communication with Friends and Family: More than three times a week    Frequency of Social Gatherings with Friends and Family: More than three times a week    Attends Religious Services: 1 to 4 times per year    Active Member of [employer] or Organizations: No    Attends Banker Meetings: Never    Marital Status: Divorced  Catering manager Violence: Not At Risk (06/07/2023)   Humiliation, Afraid, Rape, and Kick  questionnaire    Fear of Current or Ex-Partner: No    Emotionally Abused: No    Physically Abused: No    Sexually Abused: No   Family History  Problem Relation Age of Onset   Other Mother        cardiac surgery. late 1990s   Heart Problems Mother        CABG AGE 65   Congestive Heart Failure Father    Healthy Father        AGE 59   Coronary artery disease Other    Healthy Brother        AGE 64   Healthy Sister        AGE 70   Healthy Sister        AGE 76   Healthy Son    Healthy Son    Healthy Daughter    BP 112/68   Pulse 82   Wt (!) 146.2 kg (322 lb 6.4 oz)   SpO2 96%   BMI 38.23 kg/m   Wt Readings from Last 3 Encounters:  06/22/23 (!) 146.2 kg (322 lb 6.4 oz)  06/20/23 (!) 147.2 kg (324 lb 9.6 oz)  06/18/23 (!) 147.5 kg (325 lb 3.2 oz)    PHYSICAL EXAM: General:  well appearing.  No respiratory difficulty. Walked into clinic HEENT: normal Neck: supple. JVD ~8 cm. Carotids 2+ bilat; no bruits. No lymphadenopathy or thyromegaly appreciated. Cor: PMI nondisplaced. Regular rate & rhythm. No rubs, gallops or murmurs. Lungs: clear Abdomen: soft, nontender, nondistended. No hepatosplenomegaly. No bruits or masses. Good bowel sounds. Extremities: no cyanosis, clubbing, rash, trace BLE edema  Neuro: alert & oriented x 3, cranial nerves grossly intact. moves all 4 extremities w/o difficulty. Affect pleasant.   ASSESSMENT & PLAN: 1. Chronic systolic CHF: NICM, Echo 12/2016 EF 15%. Normal coronaries on cath in July 2016. Medtronic CRT-D device. ETOH vs HTN vs myocarditis as cause of cardiomyopathy. Echo 12/22 showed that EF remains 20-25%.  PYP scan not suggestive of TTR cardiac amyloidosis.  NYHA class II symptoms.  He is not volume overloaded on exam or Optivol.  Effective  BiV pacing > 95% of the time.  - Continue torsemide 60 mg daily alternating with 40 mg every other day. BMET today. Instructed on when to increase to 60 mg BID (weight gain, SOB, swelling). Instructed to  contact us if symptoms persist - Continue Entresto 49/51 mg bid. - Continue spironolactone 25 mg daily.  - Intolerant to Coreg in the past with hypotension, he is on sotalol and amiodarone.  - He stopped Jardiance after 3 UTIs, would not start back.  - Discussed cutting back on ETOH as it may play a role in his cardiomyopathy.   - BMET/BNP today 2. Paroxysmal atrial fibrillation: He is in NSR today.  - Continue amiodarone 200 mg daily.  Normal LFTs/TSH 9/24.  Should have regular eye exam.  - Continue Xarelto 20 mg daily.  No bleeding issues. CBC today. 3. ETOH abuse: Suspect this contributes to cardiomyopathy.  Still drinking moderately, I again encouraged him to cut back.  4. Tobacco abuse: No longer smoking but does smoke marijuana on some days 5. VT: Followed closely by EP, EP increased baseline pacing rate to 80. He is now on amiodarone, sotalol, and toprol XL 25 mg daily. QTc 516 msec today  6. OSA: Use CPAP.  7. Depression/Anxiety: lots of life stressors + mother recently passed away.  - Now off Zoloft d/t prolonged erections - symptoms improving   Follow up in 4 months with Dr. Shirlee Latch.  Alen Bleacher, NP 06/22/23

## 2023-06-22 ENCOUNTER — Telehealth (HOSPITAL_COMMUNITY): Payer: Self-pay | Admitting: Student

## 2023-06-22 ENCOUNTER — Encounter (HOSPITAL_COMMUNITY): Payer: Self-pay

## 2023-06-22 ENCOUNTER — Ambulatory Visit (HOSPITAL_COMMUNITY)
Admit: 2023-06-22 | Discharge: 2023-06-22 | Disposition: A | Discharge status: Home / Self Care | Payer: Medicare HMO | Attending: Internal Medicine | Admitting: Internal Medicine

## 2023-06-22 VITALS — BP 112/68 | HR 82 | Wt 322.4 lb

## 2023-06-22 DIAGNOSIS — I2510 Atherosclerotic heart disease of native coronary artery without angina pectoris: Secondary | ICD-10-CM | POA: Insufficient documentation

## 2023-06-22 DIAGNOSIS — F32A Depression, unspecified: Secondary | ICD-10-CM | POA: Diagnosis not present

## 2023-06-22 DIAGNOSIS — I480 Paroxysmal atrial fibrillation: Secondary | ICD-10-CM | POA: Insufficient documentation

## 2023-06-22 DIAGNOSIS — F1010 Alcohol abuse, uncomplicated: Secondary | ICD-10-CM | POA: Diagnosis not present

## 2023-06-22 DIAGNOSIS — G4733 Obstructive sleep apnea (adult) (pediatric): Secondary | ICD-10-CM

## 2023-06-22 DIAGNOSIS — Z95810 Presence of automatic (implantable) cardiac defibrillator: Secondary | ICD-10-CM | POA: Insufficient documentation

## 2023-06-22 DIAGNOSIS — Z79899 Other long term (current) drug therapy: Secondary | ICD-10-CM | POA: Diagnosis not present

## 2023-06-22 DIAGNOSIS — I472 Ventricular tachycardia, unspecified: Secondary | ICD-10-CM | POA: Diagnosis not present

## 2023-06-22 DIAGNOSIS — Z7901 Long term (current) use of anticoagulants: Secondary | ICD-10-CM | POA: Diagnosis not present

## 2023-06-22 DIAGNOSIS — I110 Hypertensive heart disease with heart failure: Secondary | ICD-10-CM | POA: Diagnosis not present

## 2023-06-22 DIAGNOSIS — Z87891 Personal history of nicotine dependence: Secondary | ICD-10-CM

## 2023-06-22 DIAGNOSIS — I4891 Unspecified atrial fibrillation: Secondary | ICD-10-CM

## 2023-06-22 DIAGNOSIS — I5022 Chronic systolic (congestive) heart failure: Secondary | ICD-10-CM

## 2023-06-22 DIAGNOSIS — I428 Other cardiomyopathies: Secondary | ICD-10-CM | POA: Insufficient documentation

## 2023-06-22 DIAGNOSIS — F419 Anxiety disorder, unspecified: Secondary | ICD-10-CM | POA: Diagnosis not present

## 2023-06-22 LAB — CBC
HCT: 33.4 % — ABNORMAL LOW (ref 39.0–52.0)
Hemoglobin: 11.4 g/dL — ABNORMAL LOW (ref 13.0–17.0)
MCH: 32.6 pg (ref 26.0–34.0)
MCHC: 34.1 g/dL (ref 30.0–36.0)
MCV: 95.4 fL (ref 80.0–100.0)
Platelets: 165 10*3/uL (ref 150–400)
RBC: 3.5 MIL/uL — ABNORMAL LOW (ref 4.22–5.81)
RDW: 12.9 % (ref 11.5–15.5)
WBC: 4.8 10*3/uL (ref 4.0–10.5)
nRBC: 0 % (ref 0.0–0.2)

## 2023-06-22 LAB — BRAIN NATRIURETIC PEPTIDE: B Natriuretic Peptide: 149.1 pg/mL — ABNORMAL HIGH (ref 0.0–100.0)

## 2023-06-22 LAB — BASIC METABOLIC PANEL
Anion gap: 7 (ref 5–15)
BUN: 18 mg/dL (ref 6–20)
CO2: 30 mmol/L (ref 22–32)
Calcium: 9.3 mg/dL (ref 8.9–10.3)
Chloride: 102 mmol/L (ref 98–111)
Creatinine, Ser: 1.27 mg/dL — ABNORMAL HIGH (ref 0.61–1.24)
GFR, Estimated: >60 mL/min (ref >60)
Glucose, Bld: 107 mg/dL — ABNORMAL HIGH (ref 70–99)
Potassium: 4.1 mmol/L (ref 3.5–5.1)
Sodium: 139 mmol/L (ref 135–145)

## 2023-06-22 NOTE — Patient Instructions (Signed)
Medication Changes:  No Changes In Medications at this time.   Lab Work:  Labs done today, your results will be available in MyChart, we will contact you for abnormal readings.   Follow-Up in: 4 months with Dr. Shirlee Latch PLEASE CALL OUR OFFICE AROUND DECEMBER TO GET SCHEDULED FOR YOUR APPOINTMENT. PHONE NUMBER IS [phone] OPTION 2   At the [facility], you and your health needs are our priority. We have a designated team specialized in the treatment of Heart Failure. This Care Team includes your primary Heart Failure Specialized Cardiologist (physician), Advanced Practice Providers (APPs- Physician Assistants and Nurse Practitioners), and Pharmacist who all work together to provide you with the care you need, when you need it.   You may see any of the following providers on your designated Care Team at your next follow up:  Dr. Arvilla Meres Dr. Marca Ancona Dr. Dorthula Nettles Dr. Theresia Bough Tonye Becket, NP Robbie Lis, [place] [facility] [place], [place] Brynda Peon, NP [place] Lee, NP Karle Plumber, PharmD   Please be sure to bring in all your medications bottles to every appointment.   Need to Contact us:  If you have any questions or concerns before your next appointment please send [place] a message through [place] or call our office at [phone].    TO LEAVE A MESSAGE FOR THE NURSE SELECT OPTION 2, PLEASE LEAVE A MESSAGE INCLUDING: YOUR NAME DATE OF BIRTH CALL BACK NUMBER REASON FOR CALL**this is important as we prioritize the call backs  YOU WILL RECEIVE A CALL BACK THE SAME DAY AS LONG AS YOU CALL BEFORE 4:00 PM

## 2023-06-22 NOTE — Telephone Encounter (Signed)
Patient cancelled echocardiogram for reason below:  06/22/2023 12:30 PM TERRY, Montel Clock E Cancel [id number] (patient states his doctor advised that this appointment is not necessary)   Order will be removed from the echo WQ. Thank you

## 2023-06-25 ENCOUNTER — Other Ambulatory Visit (HOSPITAL_COMMUNITY): Payer: Medicare HMO

## 2023-07-02 ENCOUNTER — Ambulatory Visit: Payer: Medicare HMO | Attending: Internal Medicine

## 2023-07-02 ENCOUNTER — Ambulatory Visit: Payer: Medicare HMO

## 2023-07-02 DIAGNOSIS — Z95810 Presence of automatic (implantable) cardiac defibrillator: Secondary | ICD-10-CM | POA: Diagnosis not present

## 2023-07-02 DIAGNOSIS — I472 Ventricular tachycardia, unspecified: Secondary | ICD-10-CM | POA: Diagnosis not present

## 2023-07-02 DIAGNOSIS — Z79899 Other long term (current) drug therapy: Secondary | ICD-10-CM

## 2023-07-02 DIAGNOSIS — I5022 Chronic systolic (congestive) heart failure: Secondary | ICD-10-CM

## 2023-07-02 DIAGNOSIS — I428 Other cardiomyopathies: Secondary | ICD-10-CM | POA: Diagnosis not present

## 2023-07-02 DIAGNOSIS — I4891 Unspecified atrial fibrillation: Secondary | ICD-10-CM | POA: Diagnosis not present

## 2023-07-02 DIAGNOSIS — I493 Ventricular premature depolarization: Secondary | ICD-10-CM | POA: Diagnosis not present

## 2023-07-03 LAB — BASIC METABOLIC PANEL
BUN/Creatinine Ratio: 17 (ref 10–24)
BUN: 27 mg/dL (ref 8–27)
CO2: 25 mmol/L (ref 20–29)
Calcium: 8.8 mg/dL (ref 8.6–10.2)
Chloride: 101 mmol/L (ref 96–106)
Creatinine, Ser: 1.57 mg/dL — ABNORMAL HIGH (ref 0.76–1.27)
Glucose: 130 mg/dL — ABNORMAL HIGH (ref 70–99)
Potassium: 4.2 mmol/L (ref 3.5–5.2)
Sodium: 140 mmol/L (ref 134–144)
eGFR: 50 mL/min/{1.73_m2} — ABNORMAL LOW (ref >59)

## 2023-07-06 NOTE — Progress Notes (Signed)
EPIC Encounter for ICM Monitoring  Patient Name: [name] [name] is a 62 y.o. male Date: 07/06/2023 Primary Care Physican: [name], NP Primary Cardiologist: Shirlee Latch Electrophysiologist: Graciela Husbands BiV Pacing: 96.1% 03/21/2023 Weight: 320 lbs 07/06/2023 Weight: 320 lbs   Clinical Status  Since 22-Jun-2023 Treated VT  1   ATP 10/7  AT/AF(Monitor)  Monitored VT (133-150 bpm) 1 VT-NS (>4 beats, >150 bpm)  15   Spoke with patient and heart failure questions reviewed.  Transmission results reviewed.  Pt asymptomatic for fluid accumulation.  He has been compliant with meds and did not have any problems or symptoms of ATP on 10/7.   Hospitalized  9/19-9/20 for VT   Optivol Thoracic impedance suggesting normal fluid levels with the exception of possible fluid accumulation from 9/24-10/6.     Prescribed:  Torsemide 20 mg 3 tablets (60 mg total) by mouth every other day alternating with 2 tablets (40 mg total) every other day.  Per 10/4 HF clinic note, discussed with patient when to increase to 60 mg bid (wt gain, SOB, swelling)  Potassium 20 mEq take 1 tablet by mouth daily Spironolactone 25 mg take 1 tablet daily.   Labs: 07/02/2023 Creatinine 1.57, BUN 27, Potassium 4.2, Sodium 140  06/22/2023 Creatinine 1.27, BUN 18, Potassium 4.1, Sodium 139, GFR >60  06/08/2023 Creatinine 1.27, BUN 18, Potassium 3.5, Sodium 141, GFR >60  06/07/2023 Creatinine 1.60, BUN 22, Potassium 3.5, Sodium 140, GFR 49 06/04/2023 Creatinine 1.78, BUN 43, Potassium 4.5, Sodium 141  04/20/2023 Creatinine 1.59, BUN 27, Potassium 4.2, Sodium 140, GFR 49  A complete set of results can be found in Results Review.   Recommendations:   No changes and encouraged to call if experiencing any fluid symptoms.  Advised Otilio Saber, [place] will follow up regarding ATP at 10/29 OV.     Follow-up plan: ICM clinic phone appointment on 08/06/2023.   91 day device clinic remote transmission 08/22/2023.     EP/Cardiology Office Visits:   07/17/2023 with Otilio Saber, PA.  09/28/2023 with Dr Graciela Husbands.  Next HF clinic due 10/2023 for 4 month f/u (no recall).   Copy of ICM check sent to Dr. Graciela Husbands.    3 month ICM trend: 07/02/2023.    12-14 Month ICM trend:     [name], RN 07/06/2023 9:50 AM

## 2023-07-06 NOTE — Progress Notes (Signed)
RE: ATP Received: 4 days ago [name], RN  Delainey Winstanley, Josephine Igo, RN Thanks for sending. This is not new for patient and he has upcoming apt with Mardelle Matte in about 2 weeks for f/u. Thanks! Leigh

## 2023-07-10 ENCOUNTER — Other Ambulatory Visit (HOSPITAL_COMMUNITY): Payer: Self-pay

## 2023-07-16 NOTE — Progress Notes (Unsigned)
Electrophysiology Office Note:   ID:  [name] [name], DOB [birth year], MRN [id number]  Primary Cardiologist: Marca Ancona, MD Electrophysiologist: Sherryl Manges, MD  {Click to update primary MD,subspecialty MD or APP then REFRESH:1}    History of Present Illness:   [name] [name] is a 62 y.o. male with h/o chronic systolic CHF, dilated NICM, s/p BiV ICD, PAF, ETOH use, tobacco use, OSA on CPAP and HTN  seen today for routine electrophysiology followup.   Atrial fibrillation and polymorphic ventricular tachycardia Rx w amiodarone therapy.      Recurrent ventricular tachycardia 9/18 potassium levels were normal at that time.   Amiodarone was continued and sotalol was initiated.   Intercurrent ventricular tachycardia treated with ATP ; 9/24 hospitalization for recurrent shock Rx VT; Metoprolol uptitrated Cardiac cath 06/07/2023 showed normal filling pressures and mild, non-obstructive CAD.    Interval shock for ATP failure as well as an interval episode of 3 minutes of ventricular tachycardia  Since last being seen in our clinic the patient reports doing ***.  he denies chest pain, palpitations, dyspnea, PND, orthopnea, nausea, vomiting, dizziness, syncope, edema, weight gain, or early satiety.   Review of systems complete and found to be negative unless listed in HPI.   EP Information / Studies Reviewed:    EKG is not ordered today. EKG from *** reviewed which showed ***       ICD Interrogation-  reviewed in detail today,  See PACEART report.  Device History: Medtronic BiV ICD implanted 2016, gen change 02/2022 for CHF History of appropriate therapy: Yes History of AAD therapy: Yes; currently on amiodarone and sotalol    Physical Exam:   VS:  There were no vitals taken for this visit.   Wt Readings from Last 3 Encounters:  06/22/23 (!) 322 lb 6.4 oz (146.2 kg)  06/20/23 (!) 324 lb 9.6 oz (147.2 kg)  06/18/23 (!) 325 lb 3.2 oz (147.5 kg)     GEN: Well nourished, well developed in no  acute distress NECK: No JVD; No carotid bruits CARDIAC: {EPRHYTHM:28826}, no murmurs, rubs, gallops RESPIRATORY:  Clear to auscultation without rales, wheezing or rhonchi  ABDOMEN: Soft, non-tender, non-distended EXTREMITIES:  No edema; No deformity   ASSESSMENT AND PLAN:    Nonischemic cardiomyopathy   Right bundle branch block   Congestive heart failure-chronic-systolic   Ventricular tachycardia-polymorphic   Implantable defibrillator-CRT-Medtronic   PVCs   Atrial fibrillation-paroxysmal   Amiodarone therapy for the above   Sinus node dysfunction with chronotropic incompetence   SGLT2 intolerance  Has had drug refractory, recurrent VT.  Continue amiodarone 200 mg BID along with sotalol Dr. Graciela Husbands has reached out to Dr. Berneice Gandy per notes.   Labs today with complicated regiment to follow renal function and electrolytes.   On Entresto and spiro.   Programmed at [facility] of [facility] to attempt overdrive pacing of his PVCs. Burden ***%  Disposition:   Follow up with {EPPROVIDERS:28135} {EPFOLLOW UP:28173}   Signed, Graciella Freer, PA-C

## 2023-07-17 ENCOUNTER — Encounter: Payer: Self-pay | Admitting: Internal Medicine

## 2023-07-17 ENCOUNTER — Ambulatory Visit: Payer: Medicare HMO | Attending: Student | Admitting: Student

## 2023-07-17 VITALS — BP 98/66 | HR 83 | Ht 77.0 in | Wt 332.2 lb

## 2023-07-17 DIAGNOSIS — Z95810 Presence of automatic (implantable) cardiac defibrillator: Secondary | ICD-10-CM | POA: Diagnosis not present

## 2023-07-17 DIAGNOSIS — I472 Ventricular tachycardia, unspecified: Secondary | ICD-10-CM

## 2023-07-17 DIAGNOSIS — I5022 Chronic systolic (congestive) heart failure: Secondary | ICD-10-CM | POA: Diagnosis not present

## 2023-07-17 DIAGNOSIS — I4891 Unspecified atrial fibrillation: Secondary | ICD-10-CM

## 2023-07-17 LAB — CUP PACEART INCLINIC DEVICE CHECK
Battery Remaining Longevity: 80 mo
Battery Voltage: 2.98 V
Brady Statistic AP VP Percent: 95.98 %
Brady Statistic AP VS Percent: 3.99 %
Brady Statistic AS VP Percent: 0.02 %
Brady Statistic AS VS Percent: 0 %
Brady Statistic RA Percent Paced: 99.92 %
Brady Statistic RV Percent Paced: 96.33 %
Date Time Interrogation Session: 20241029123501
HighPow Impedance: 67 Ohm
Implantable Lead Connection Status: 753985
Implantable Lead Connection Status: 753985
Implantable Lead Connection Status: 753985
Implantable Lead Implant Date: 20160727
Implantable Lead Implant Date: 20160727
Implantable Lead Implant Date: 20160727
Implantable Lead Location: 753858
Implantable Lead Location: 753859
Implantable Lead Location: 753860
Implantable Lead Model: 4598
Implantable Lead Model: 5076
Implantable Lead Model: 6935
Implantable Pulse Generator Implant Date: 20230605
Lead Channel Impedance Value: 175.622
Lead Channel Impedance Value: 175.622
Lead Channel Impedance Value: 180.5 Ohm
Lead Channel Impedance Value: 189.525
Lead Channel Impedance Value: 189.525
Lead Channel Impedance Value: 342 Ohm
Lead Channel Impedance Value: 361 Ohm
Lead Channel Impedance Value: 361 Ohm
Lead Channel Impedance Value: 399 Ohm
Lead Channel Impedance Value: 418 Ohm
Lead Channel Impedance Value: 456 Ohm
Lead Channel Impedance Value: 456 Ohm
Lead Channel Impedance Value: 456 Ohm
Lead Channel Impedance Value: 589 Ohm
Lead Channel Impedance Value: 646 Ohm
Lead Channel Impedance Value: 646 Ohm
Lead Channel Impedance Value: 646 Ohm
Lead Channel Impedance Value: 665 Ohm
Lead Channel Pacing Threshold Amplitude: 0.5 V
Lead Channel Pacing Threshold Amplitude: 0.75 V
Lead Channel Pacing Threshold Amplitude: 1.125 V
Lead Channel Pacing Threshold Pulse Width: 0.4 ms
Lead Channel Pacing Threshold Pulse Width: 0.4 ms
Lead Channel Pacing Threshold Pulse Width: 0.4 ms
Lead Channel Sensing Intrinsic Amplitude: 0.75 mV
Lead Channel Sensing Intrinsic Amplitude: 3.25 mV
Lead Channel Sensing Intrinsic Amplitude: 3.375 mV
Lead Channel Sensing Intrinsic Amplitude: 3.5 mV
Lead Channel Setting Pacing Amplitude: 1.5 V
Lead Channel Setting Pacing Amplitude: 1.75 V
Lead Channel Setting Pacing Amplitude: 2 V
Lead Channel Setting Pacing Pulse Width: 0.4 ms
Lead Channel Setting Pacing Pulse Width: 0.4 ms
Lead Channel Setting Sensing Sensitivity: 0.3 mV
Zone Setting Status: 755011

## 2023-07-17 NOTE — Patient Instructions (Signed)
Medication Instructions:  Your physician recommends that you continue on your current medications as directed. Please refer to the Current Medication list given to you today.  *If you need a refill on your cardiac medications before your next appointment, please call your pharmacy*  Lab Work: BMET, MAG-TODAY If you have labs (blood work) drawn today and your tests are completely normal, you will receive your results only by: MyChart Message (if you have MyChart) OR A paper copy in the mail If you have any lab test that is abnormal or we need to change your treatment, we will call you to review the results.  Follow-Up: At [facility], you and your health needs are our priority.  As part of our continuing mission to provide you with exceptional heart care, we have created designated Provider Care Teams.  These Care Teams include your primary Cardiologist (physician) and Advanced Practice Providers (APPs -  Physician Assistants and Nurse Practitioners) who all work together to provide you with the care you need, when you need it.  Your next appointment:   As scheduled  Provider:   Sherryl Manges, MD

## 2023-07-18 LAB — BASIC METABOLIC PANEL
BUN/Creatinine Ratio: 18 (ref 10–24)
BUN: 30 mg/dL — ABNORMAL HIGH (ref 8–27)
CO2: 30 mmol/L — ABNORMAL HIGH (ref 20–29)
Calcium: 9.5 mg/dL (ref 8.6–10.2)
Chloride: 100 mmol/L (ref 96–106)
Creatinine, Ser: 1.64 mg/dL — ABNORMAL HIGH (ref 0.76–1.27)
Glucose: 88 mg/dL (ref 70–99)
Potassium: 5 mmol/L (ref 3.5–5.2)
Sodium: 139 mmol/L (ref 134–144)
eGFR: 48 mL/min/{1.73_m2} — ABNORMAL LOW (ref >59)

## 2023-07-18 LAB — MAGNESIUM: Magnesium: 2.2 mg/dL (ref 1.6–2.3)

## 2023-07-28 ENCOUNTER — Other Ambulatory Visit (HOSPITAL_COMMUNITY): Payer: Self-pay | Admitting: Cardiology

## 2023-08-06 ENCOUNTER — Ambulatory Visit: Payer: Medicare HMO | Attending: Internal Medicine

## 2023-08-06 DIAGNOSIS — I5022 Chronic systolic (congestive) heart failure: Secondary | ICD-10-CM | POA: Diagnosis not present

## 2023-08-06 DIAGNOSIS — Z95810 Presence of automatic (implantable) cardiac defibrillator: Secondary | ICD-10-CM

## 2023-08-07 ENCOUNTER — Telehealth: Payer: Self-pay

## 2023-08-07 NOTE — Telephone Encounter (Signed)
Discussed with AT.  Pt referred to Dr. Berneice Gandy.  Ensure Pt has follow up.  Spoke with Pt-he had not heard from Dr. Nelta Numbers office yet.  Spoke with NP for Dr. Berneice Gandy.  Pt information given.  She will call Pt to schedule appointment.

## 2023-08-08 NOTE — Progress Notes (Signed)
EPIC Encounter for ICM Monitoring  Patient Name: [name] [name] is a 62 y.o. male Date: 08/08/2023 Primary Care Physican: [name], NP Primary Cardiologist: Shirlee Latch Electrophysiologist: Graciela Husbands BiV Pacing: 95% 03/21/2023 Weight: 320 lbs 07/06/2023 Weight: 320 lbs  Clinical Status Since 20-Jul-2023 Treated  VF   1 VT   4  Monitored VT (133-150 bpm)   3 VT-NS (>4 beats, >150 bpm)   164   Transmission reviewed.   Dr Graciela Husbands referred pt to Dr Berneice Gandy for follow regarding recent VT episodes.  Device triage nurse spoke with patient regarding referral to Dr Berneice Gandy.   Optivol Thoracic impedance suggesting normal fluid levels with the exception of possible fluid accumulation from 11/3-11/12   Device clinic triage nurse addressed VT with EP APP/Physician and pt.      Prescribed:  Torsemide 20 mg 3 tablets (60 mg total) by mouth every other day alternating with 2 tablets (40 mg total) every other day.  Per 10/4 HF clinic note, discussed with patient when to increase to 60 mg bid (wt gain, SOB, swelling)  Potassium 20 mEq take 1 tablet by mouth daily Spironolactone 25 mg take 1 tablet daily.   Labs: 07/17/2023 Creatinine 1.64, BUN 18, Potassium 5.0, Sodium 139, GFR 48 07/02/2023 Creatinine 1.57, BUN 27, Potassium 4.2, Sodium 140  06/22/2023 Creatinine 1.27, BUN 18, Potassium 4.1, Sodium 139, GFR >60  06/08/2023 Creatinine 1.27, BUN 18, Potassium 3.5, Sodium 141, GFR >60  06/07/2023 Creatinine 1.60, BUN 22, Potassium 3.5, Sodium 140, GFR 49 06/04/2023 Creatinine 1.78, BUN 43, Potassium 4.5, Sodium 141  04/20/2023 Creatinine 1.59, BUN 27, Potassium 4.2, Sodium 140, GFR 49  A complete set of results can be found in Results Review.   Recommendations:   No changes    Follow-up plan: ICM clinic phone appointment on 09/17/2023.   91 day device clinic remote transmission 08/22/2023.     EP/Cardiology Office Visits:   09/28/2023 with Dr Graciela Husbands.  Next HF clinic due 10/2023 for 4 month f/u (no  recall).   Copy of ICM check sent to Dr. Graciela Husbands.   3 month ICM trend: 08/05/2023.    12-14 Month ICM trend:     [name], RN 08/08/2023 7:27 AM

## 2023-08-10 ENCOUNTER — Other Ambulatory Visit (HOSPITAL_COMMUNITY): Payer: Self-pay

## 2023-08-22 ENCOUNTER — Ambulatory Visit (INDEPENDENT_AMBULATORY_CARE_PROVIDER_SITE_OTHER): Payer: Medicare HMO

## 2023-08-22 DIAGNOSIS — I472 Ventricular tachycardia, unspecified: Secondary | ICD-10-CM | POA: Diagnosis not present

## 2023-08-22 LAB — CUP PACEART REMOTE DEVICE CHECK
Battery Remaining Longevity: 71 mo
Battery Voltage: 2.98 V
Brady Statistic AP VP Percent: 94.54 %
Brady Statistic AP VS Percent: 5.41 %
Brady Statistic AS VP Percent: 0.04 %
Brady Statistic AS VS Percent: 0.01 %
Brady Statistic RA Percent Paced: 99.79 %
Brady Statistic RV Percent Paced: 94.84 %
Date Time Interrogation Session: 20241204044223
HighPow Impedance: 67 Ohm
Implantable Lead Connection Status: 753985
Implantable Lead Connection Status: 753985
Implantable Lead Connection Status: 753985
Implantable Lead Implant Date: 20160727
Implantable Lead Implant Date: 20160727
Implantable Lead Implant Date: 20160727
Implantable Lead Location: 753858
Implantable Lead Location: 753859
Implantable Lead Location: 753860
Implantable Lead Model: 4598
Implantable Lead Model: 5076
Implantable Lead Model: 6935
Implantable Pulse Generator Implant Date: 20230605
Lead Channel Impedance Value: 165.029
Lead Channel Impedance Value: 165.029
Lead Channel Impedance Value: 180.5 Ohm
Lead Channel Impedance Value: 189.525
Lead Channel Impedance Value: 189.525
Lead Channel Impedance Value: 304 Ohm
Lead Channel Impedance Value: 342 Ohm
Lead Channel Impedance Value: 361 Ohm
Lead Channel Impedance Value: 361 Ohm
Lead Channel Impedance Value: 361 Ohm
Lead Channel Impedance Value: 399 Ohm
Lead Channel Impedance Value: 399 Ohm
Lead Channel Impedance Value: 418 Ohm
Lead Channel Impedance Value: 589 Ohm
Lead Channel Impedance Value: 589 Ohm
Lead Channel Impedance Value: 646 Ohm
Lead Channel Impedance Value: 646 Ohm
Lead Channel Impedance Value: 646 Ohm
Lead Channel Pacing Threshold Amplitude: 0.5 V
Lead Channel Pacing Threshold Amplitude: 0.875 V
Lead Channel Pacing Threshold Amplitude: 1.125 V
Lead Channel Pacing Threshold Pulse Width: 0.4 ms
Lead Channel Pacing Threshold Pulse Width: 0.4 ms
Lead Channel Pacing Threshold Pulse Width: 0.4 ms
Lead Channel Sensing Intrinsic Amplitude: 3.75 mV
Lead Channel Sensing Intrinsic Amplitude: 3.75 mV
Lead Channel Sensing Intrinsic Amplitude: 9 mV
Lead Channel Sensing Intrinsic Amplitude: 9 mV
Lead Channel Setting Pacing Amplitude: 1.5 V
Lead Channel Setting Pacing Amplitude: 1.75 V
Lead Channel Setting Pacing Amplitude: 2 V
Lead Channel Setting Pacing Pulse Width: 0.4 ms
Lead Channel Setting Pacing Pulse Width: 0.4 ms
Lead Channel Setting Sensing Sensitivity: 0.3 mV
Zone Setting Status: 755011

## 2023-08-23 ENCOUNTER — Telehealth: Payer: Self-pay

## 2023-08-23 MED ORDER — MEXILETINE HCL 200 MG PO CAPS
200.0000 mg | ORAL_CAPSULE | Freq: Two times a day (BID) | ORAL | 0 refills | Status: DC
Start: 2023-08-23 — End: 2023-09-11

## 2023-08-23 NOTE — Telephone Encounter (Signed)
Spoke w/ pt. Verbalized understanding of discontinuation of ranolazine and starting of Mexillitine 200 BID. Per SK. DC appt made for 11 AM Friday 12/6 to lower VT 1 to 130 BPM per SK

## 2023-08-23 NOTE — Telephone Encounter (Signed)
Scheduled remote reviewed. Normal device function.   7 VT with therapy episodes, longest 3 min 55 sec on 08/12/23 at 19:47, most recent on 08/20/23 at 06:51 of 3 min 44 sec duration, 1-4 ATP Seq appropriately and successfully delivered per episode, average V rates 158-167 bpm. Routed to Triage for Review. Per 08/07/23 Telephone Encounter in [place], patient is to be seen by Dr Berneice Gandy. 113 VT-NS and 15 VT-Mon episodes recorded since 08/06/23, longest 9 min on 08/20/23 at 05:33, most recent on 08/22/23 at 01:54 of 48 sec duration, V>A, average V rates 116-188 bpm.  Clinic to consider resetting Carealert for shock alert on Carelink. Next remote 91 days. MC, CVRS   Spoke w/ pt. Symptomatic during times associated w/ episodes. Feels heart rate increase followed by lightheadedness, dizziness, and SOB. Lasts a few minutes. Overall does not feel worse than baseline. Confirmed patient is taking sotalol, ranolazine, amio, metop as prescribed.

## 2023-08-24 ENCOUNTER — Ambulatory Visit: Payer: Medicare HMO | Attending: Cardiology

## 2023-08-24 DIAGNOSIS — I472 Ventricular tachycardia, unspecified: Secondary | ICD-10-CM

## 2023-08-27 LAB — CUP PACEART INCLINIC DEVICE CHECK
Date Time Interrogation Session: 20241206070243
Implantable Lead Connection Status: 753985
Implantable Lead Connection Status: 753985
Implantable Lead Connection Status: 753985
Implantable Lead Implant Date: 20160727
Implantable Lead Implant Date: 20160727
Implantable Lead Implant Date: 20160727
Implantable Lead Location: 753858
Implantable Lead Location: 753859
Implantable Lead Location: 753860
Implantable Lead Model: 4598
Implantable Lead Model: 5076
Implantable Lead Model: 6935
Implantable Pulse Generator Implant Date: 20230605

## 2023-08-27 NOTE — Progress Notes (Signed)
Pt brought in for adjustments to VT1 zone. Four treated episodes of VT w/ one successful round of ATP since previous remote. As of this appointment has not started Mexillitine prescribed 12/4. Pt waiting on VT ablation consult.   Programming changes made this session. - VT detection lowered from 150BPM(456ms) to 130 BPM(448ms) - VT monitor zone disabled - UTR and VS response max rate decreased from 130BPM to 115BPM Changes made per SK in conjunction with AT.

## 2023-09-11 ENCOUNTER — Telehealth: Payer: Self-pay

## 2023-09-11 MED ORDER — MEXILETINE HCL 200 MG PO CAPS: 200.0000 mg | ORAL_CAPSULE | Freq: Three times a day (TID) | ORAL | 3 refills | Status: DC

## 2023-09-11 NOTE — Telephone Encounter (Signed)
Spoke w/ pt. Asymptomatic mostly. Reports some intermittent lightheadedness/dizziness w/o syncope. Taking meds as prescribed. Will review w/ EP doc.

## 2023-09-11 NOTE — Telephone Encounter (Signed)
Unscheduled manual transmission received.  Normal device function Since 12/6:  -17 VT treated events, 12/6 between 1610-9604, 12/17 between 5409-8119, 12/9 @ 0634, 12/15 @ 1443, 12/16 between 0223-0730, and 12/18 between 1478-2956.  The longest x 22 sec with V-rates 136-194 bpm.  EGMs with V>A.  Each treated with 1-3 sequences of ATP.  Sending for review. -265 VT-NS events, longest listed 3 sec with V-rates 130s-140s.  V>A. Follow up as scheduled [place], CV[facility]  All appropriate and true VT. LM to assess symptoms, med reg, and if pt has appointment w/ EP for VT ablation yet.

## 2023-09-11 NOTE — Telephone Encounter (Signed)
Mexitel 200mg  increased to TID per [facility] MD. Pt provided w/ ED precautions and when to call the clinic back. Will schedule a remote transmission for 1 week for f/u. Has c/s w/ [facility] EP MD 10/22/23. 6 month driving restrictions given.

## 2023-09-17 ENCOUNTER — Ambulatory Visit: Payer: Medicare HMO | Attending: Internal Medicine

## 2023-09-17 DIAGNOSIS — Z95810 Presence of automatic (implantable) cardiac defibrillator: Secondary | ICD-10-CM

## 2023-09-17 DIAGNOSIS — I5022 Chronic systolic (congestive) heart failure: Secondary | ICD-10-CM | POA: Diagnosis not present

## 2023-09-17 NOTE — Telephone Encounter (Signed)
Reviewed w/ SK. Will get remote transmission 09/24/23 to f/u on increased VT burden.

## 2023-09-20 ENCOUNTER — Telehealth: Payer: Self-pay | Admitting: Pharmacy Technician

## 2023-09-20 ENCOUNTER — Other Ambulatory Visit (HOSPITAL_COMMUNITY): Payer: Self-pay

## 2023-09-20 NOTE — Telephone Encounter (Signed)
 Pharmacy Patient Advocate Encounter   Received notification from CoverMyMeds that prior authorization for Mexiletine HCl 200MG  capsules is required/requested.   Insurance verification completed.   The patient is insured through [employer] .   Per test claim: PA required; PA submitted to above mentioned insurance via CoverMyMeds Key/confirmation #/EOC [id number] Status is pending

## 2023-09-21 ENCOUNTER — Other Ambulatory Visit: Payer: Self-pay

## 2023-09-21 ENCOUNTER — Other Ambulatory Visit (HOSPITAL_COMMUNITY): Payer: Self-pay

## 2023-09-21 MED ORDER — MEXILETINE HCL 200 MG PO CAPS: 200.0000 mg | ORAL_CAPSULE | Freq: Three times a day (TID) | ORAL | 3 refills | Status: DC

## 2023-09-21 NOTE — Telephone Encounter (Signed)
 See note from pharmacy. Pharmacy Patient Advocate Encounter   Received notification from [employer] that Prior Authorization for Mexiletine HCl 200MG  capsules has been APPROVED from 09/19/23 to 09/17/24. Ran test claim, Copay is $40.80. This test claim was processed through [facility]- copay amounts may vary at other pharmacies due to pharmacy/plan contracts, or as the patient moves through the different stages of their insurance plan.    PA #/Case ID/Reference #: [id number] Called and spoke with patient to inform him of approval and he states he wants this prescription sent to [facility] pharmacy. Assured him that I would change his pharmacy of choice .

## 2023-09-21 NOTE — Telephone Encounter (Signed)
 Pharmacy Patient Advocate Encounter  Received notification from [employer] that Prior Authorization for Mexiletine HCl 200MG  capsules has been APPROVED from 09/19/23 to 09/17/24. Ran test claim, Copay is $40.80. This test claim was processed through [facility]- copay amounts may vary at other pharmacies due to pharmacy/plan contracts, or as the patient moves through the different stages of their insurance plan.   PA #/Case ID/Reference #: [id number]

## 2023-09-24 ENCOUNTER — Telehealth: Payer: Self-pay

## 2023-09-24 NOTE — Telephone Encounter (Signed)
 Called and requested pt send a manual transmission to review w/ SK.

## 2023-09-28 ENCOUNTER — Ambulatory Visit: Payer: Medicare HMO | Attending: Internal Medicine | Admitting: Internal Medicine

## 2023-09-28 ENCOUNTER — Encounter: Payer: Self-pay | Admitting: Internal Medicine

## 2023-09-28 VITALS — BP 110/68 | HR 81 | Ht 77.0 in | Wt 331.6 lb

## 2023-09-28 DIAGNOSIS — Z95810 Presence of automatic (implantable) cardiac defibrillator: Secondary | ICD-10-CM | POA: Diagnosis not present

## 2023-09-28 DIAGNOSIS — I5022 Chronic systolic (congestive) heart failure: Secondary | ICD-10-CM

## 2023-09-28 DIAGNOSIS — I428 Other cardiomyopathies: Secondary | ICD-10-CM

## 2023-09-28 DIAGNOSIS — I4510 Unspecified right bundle-branch block: Secondary | ICD-10-CM

## 2023-09-28 DIAGNOSIS — I480 Paroxysmal atrial fibrillation: Secondary | ICD-10-CM

## 2023-09-28 DIAGNOSIS — I493 Ventricular premature depolarization: Secondary | ICD-10-CM | POA: Diagnosis not present

## 2023-09-28 DIAGNOSIS — I495 Sick sinus syndrome: Secondary | ICD-10-CM | POA: Diagnosis not present

## 2023-09-28 DIAGNOSIS — I472 Ventricular tachycardia, unspecified: Secondary | ICD-10-CM | POA: Diagnosis not present

## 2023-09-28 NOTE — Progress Notes (Signed)
 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    LV programed subtherapeutic with a QRS duration of 140 ms.  Because of RBBB AND PROGRAMed FUSION PATIENT>>  Dyspnea is better since the reprogramming. GEN change 6/23  Atrial fibrillation and polymorphic ventricular tachycardia Rx w amiodarone  therapy.     Recurrent ventricular tachycardia 9/18 potassium levels were normal at that time.   Amiodarone  was continued and sotalol  was initiated.  Intercurrent ventricular tachycardia treated with ATP ; 9/24 hospitalization for recurrent shock Rx VT; Metoprolol  uptitrated  Interval shock for ATP failure as well as an interval episode of 3 minutes of ventricular tachycardia  Tolerating amiodarone  and mexiletine pretty well at this point no intercurrent treated VT for about 2-1/2 weeks. Denies dyspnea chest pain or peripheral edema   Date Cr K Mg TSH LFTs Hgb PFTs  9/18  1.02 3.8 2.3         11/18     0.860 36 11.0    12/22 1.18 3.9 2.3 (5/22) 1.085 16 11.8   12/23 1.25<<2.6 4.1    11.2   9/24 1.27<<1.6 3.5 2.1 1.137 29 11.0     DATE TEST EF   12/21 Echo 20-25 % LV global hypokinesis and hypertrophy, G1 diastolic dysfunction  12/22 Echo 20-25%   4/23 PYP  Equivocal   9/24  L/RHC  No obstruc CAD Filling press normal CO preserved   9/24 Echo   20-25%       Past Medical History:  Diagnosis Date   AICD  (automatic cardioverter/defibrillator) present    Arthritis    Atrial fibrillation (HCC)    CAD (coronary artery disease)    Mild nonobstructive (Cath 09)   Chronic systolic heart failure (HCC)    Nonischemic CM: echo 4/12 with EF 25-30%, grade 2 diast dysfxn, mild dilated aortic root 43 mm, trivial MR, mod LAE   COPD (chronic obstructive pulmonary disease) (HCC)    Dyspnea    with exertion    Ejection fraction < 50% 08/2019   20-25% noted on ECHO   H/O ETOH abuse    HTN (hypertension)    Hypersomnia    Nonischemic cardiomyopathy (HCC)    Obesity, morbid (HCC)    OSA on CPAP    Systolic and diastolic CHF, acute on chronic (HCC) 11/2016   Tobacco use disorder    Ventricular tachycardia (HCC)     Past Surgical History:  Procedure Laterality Date   CARDIAC CATHETERIZATION N/A 04/08/2015   Procedure: Right/Left Heart Cath and Coronary Angiography;  Surgeon: Candyce RAMAN Reek, MD;  Location: [facility] INVASIVE CV LAB;  Service: Cardiovascular;  Laterality: N/A;   EP IMPLANTABLE DEVICE N/A 04/14/2015   Procedure: BiV ICD Insertion CRT-D;  Surgeon: Elspeth BROCKS Fernande, MD;  Location: [facility] INVASIVE CV LAB;  Service: Cardiovascular;  Laterality: N/A;   HYDROCELE EXCISION / REPAIR     ICD GENERATOR CHANGEOUT N/A 02/20/2022   Procedure:  ICD GENERATOR CHANGEOUT;  Surgeon: Fernande Elspeth BROCKS, MD;  Location: [facility] INVASIVE CV LAB;  Service: Cardiovascular;  Laterality: N/A;   ICD IMPLANT     RIGHT/LEFT HEART CATH AND CORONARY ANGIOGRAPHY N/A 06/07/2023   Procedure: RIGHT/LEFT HEART CATH AND CORONARY ANGIOGRAPHY;  Surgeon: Rolan Ezra RAMAN, MD;  Location: [facility] INVASIVE CV LAB;  Service: Cardiovascular;  Laterality: N/A;   TOTAL HIP ARTHROPLASTY Right 11/04/2018   Procedure: TOTAL HIP ARTHROPLASTY ANTERIOR APPROACH;  Surgeon: Yvone Rush, MD;  Location: [facility] ORS;  Service: Orthopedics;  Laterality: Right;   TOTAL KNEE ARTHROPLASTY Right 01/30/2020   Procedure: TOTAL KNEE ARTHROPLASTY;  Surgeon: Yvone Rush, MD;   Location: [facility] ORS;  Service: Orthopedics;  Laterality: Right;   TOTAL KNEE ARTHROPLASTY Left 07/30/2020   Procedure: TOTAL KNEE ARTHROPLASTY;  Surgeon: Yvone Rush, MD;  Location: [facility] ORS;  Service: Orthopedics;  Laterality: Left;   TUMOR EXCISION Left    TUMOR EXCISION Left 1982   Benign 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 18 g 0   amiodarone  (PACERONE ) 200 MG tablet Take 1 tablet (200 mg total) by mouth 2 (two) times daily. 90 tablet 3   Ascorbic Acid (VITAMIN C) 100 MG tablet Take 100 mg by mouth daily.     atorvastatin  (LIPITOR ) 80 MG tablet TAKE 1 TABLET EVERY EVENING 90 tablet 3   ibuprofen (ADVIL) 200 MG tablet Take 400 mg by mouth every 8 (eight) hours as needed for moderate pain.     metoprolol  succinate (TOPROL -XL) 25 MG 24 hr tablet Take 1 tablet (25 mg total) by mouth daily. 30 tablet 5   mexiletine (MEXITIL) 200 MG capsule Take 1 capsule (200 mg total) by mouth 3 (three) times daily for 120 doses. 30 capsule 3   Multiple Vitamins-Minerals (MULTIVITAMIN WITH MINERALS) tablet Take 1 tablet by mouth daily.     OVER THE COUNTER MEDICATION Take 1 tablet by mouth daily. Healthy eye care     potassium chloride  SA (KLOR-CON  M) 20 MEQ tablet Take 1 tablet (20 mEq total) by mouth daily. 90 tablet 3   sacubitril -valsartan  (ENTRESTO ) 49-51 MG Take 1 tablet by mouth 2 (two) times daily. 180 tablet 3   sotalol  (BETAPACE ) 80 MG tablet Take 1 tablet (80 mg total) by mouth 2 (two) times daily. 60 tablet 3   spironolactone  (ALDACTONE ) 25 MG tablet TAKE 1 TABLET EVERY DAY 90 tablet 3   torsemide  (DEMADEX ) 20 MG tablet TAKE 3 TABLETS EVERY MORNING AND TAKE 2 TABLETS EVERY EVENING 450 tablet 3   XARELTO  20 MG TABS tablet TAKE 1 TABLET DAILY WITH SUPPER. 90 tablet 3   No current  facility-administered medications for this visit.    Allergies  Allergen Reactions   Sglt2 Inhibitors Other (See Comments)    Recurrent UTI      Review of Systems negative except from HPI and PMH  Physical Exam BP 110/68   Pulse 81   Ht 6' 5 (1.956 m)   Wt (!) 331 lb 9.6 oz (150.4 kg)   SpO2 97%   BMI 39.32 kg/m  Well developed and well nourished in no acute distress HENT normal Neck supple with JVP-flat Clear Device pocket well healed; without hematoma or erythema.  There is no tethering  Regular rate  and rhythm, no  gallop No  murmur Abd-soft with active BS No Clubbing cyanosis  edema Skin-warm and dry A & Oriented  Grossly normal sensory and motor function  ECG AV pacing with a QR in lead V1 and a Rs in lead I  Device function is normal. Programming changes none   See Paceart for details    Device function is normal. Programming changes VT detection was decreased from 180 to 160 ms ATP was added See Paceart for details    12/18 QRS Rs V1 and negative lead I QRSd approximately 200 ms  1/20 QRS upright lead I and negative lead V1 QRSd 158 ms 12/20 QRS negative lead I and upright V1 QRSd 218 ms 12/21 QRS negative lead V1 upright lead I QRSd 142 ms 8/22 QRS negative lead V1 upright lead I QRSd 140 ms  Assessment and  Plan  Nonischemic cardiomyopathy  Right bundle branch block  Congestive heart failure-chronic-systolic  Ventricular tachycardia-polymorphic  Implantable defibrillator-CRT-[employer]  PVCs  Atrial fibrillation-paroxysmal  Amiodarone  therapy for the above  Sinus node dysfunction with chronotropic incompetence  SGLT2 intolerance  Recurrent ventricular tachycardia.  On amiodarone  and mexiletine the latter with some dizziness.  Has an appointment in about 3 weeks with Dr. Edwyna for catheter ablation       .

## 2023-09-28 NOTE — Patient Instructions (Signed)
 Medication Instructions:  Your physician recommends that you continue on your current medications as directed. Please refer to the Current Medication list given to you today.  *If you need a refill on your cardiac medications before your next appointment, please call your pharmacy*   Lab Work: None ordered.  If you have labs (blood work) drawn today and your tests are completely normal, you will receive your results only by: MyChart Message (if you have MyChart) OR A paper copy in the mail If you have any lab test that is abnormal or we need to change your treatment, we will call you to review the results.   Testing/Procedures: None ordered.    Follow-Up: At [facility], you and your health needs are our priority.  As part of our continuing mission to provide you with exceptional heart care, we have created designated Provider Care Teams.  These Care Teams include your primary Cardiologist (physician) and Advanced Practice Providers (APPs -  Physician Assistants and Nurse Practitioners) who all work together to provide you with the care you need, when you need it.  We recommend signing up for the patient portal called "MyChart".  Sign up information is provided on this After Visit Summary.  MyChart is used to connect with patients for Virtual Visits (Telemedicine).  Patients are able to view lab/test results, encounter notes, upcoming appointments, etc.  Non-urgent messages can be sent to your provider as well.   To learn more about what you can do with MyChart, go to [email or web address].    Your next appointment:   3-4 months with Dr Graciela Husbands

## 2023-10-04 ENCOUNTER — Ambulatory Visit: Payer: Medicare HMO

## 2023-10-04 DIAGNOSIS — Z0000 Encounter for general adult medical examination without abnormal findings: Secondary | ICD-10-CM

## 2023-10-04 NOTE — Patient Instructions (Signed)
[name] , Thank you for taking time to come for your Medicare Wellness Visit. I appreciate your ongoing commitment to your health goals. Please review the following plan we discussed and let me know if I can assist you in the future.   Referrals/Orders/Follow-Ups/Clinician Recommendations: none  This is a list of the screening recommended for you and due dates:  Health Maintenance  Topic Date Due   Hepatitis C Screening  Never done   Zoster (Shingles) Vaccine (1 of 2) Never done   Colon Cancer Screening  Never done   Pneumococcal Vaccination (2 of 2 - PCV) 11/20/2020   COVID-19 Vaccine (1) 10/20/2023*   DTaP/Tdap/Td vaccine (2 - Td or Tdap) 01/29/2024   Medicare Annual Wellness Visit  10/03/2024   Flu Shot  Completed   HIV Screening  Completed   HPV Vaccine  Aged Out  *Topic was postponed. The date shown is not the original due date.    Advanced directives: (Copy Requested) Please bring a copy of your health care power of attorney and living will to the office to be added to your chart at your convenience.  Next Medicare Annual Wellness Visit scheduled for next year: Yes  insert Preventive Care attachment Insert FALL PREVENTION attachment if needed

## 2023-10-04 NOTE — Progress Notes (Cosign Needed Addendum)
Subjective:   [name] [name] is a 63 y.o. male who presents for Medicare Annual/Subsequent preventive examination.  Visit Complete: Virtual I connected with  [name] [name] on 10/04/23 by a audio enabled telemedicine application and verified that I am speaking with the correct person using two identifiers.  Interactive audio and video telecommunications were attempted between this provider and patient, however failed, due to patient having technical difficulties OR patient did not have access to video capability.  We continued and completed visit with audio only.  Patient Location: Home  Provider Location: Home Office  I discussed the limitations of evaluation and management by telemedicine. The patient expressed understanding and agreed to proceed.  Vital Signs: Because this visit was a virtual/telehealth visit, some criteria may be missing or patient reported. Any vitals not documented were not able to be obtained and vitals that have been documented are patient reported.    Cardiac Risk Factors include: advanced age (>4men, >49 women);hypertension;male gender     Objective:    Today's Vitals   There is no height or weight on file to calculate BMI.     10/04/2023    9:37 AM 06/07/2023    2:46 PM 06/07/2023    6:25 AM 02/20/2022    9:19 AM 05/05/2021    9:57 PM 01/13/2021    9:09 PM 07/30/2020    8:00 PM  Advanced Directives  Does Patient Have a Medical Advance Directive? Yes No No No Yes No Yes  Type of Advance Directive Living will    Healthcare Power of [employer] of [place];Living will  Does patient want to make changes to medical advance directive?     No - Patient declined  No - Patient declined  Copy of Healthcare Power of Attorney in Chart?     No - copy requested  No - copy requested  Would patient like information on creating a medical advance directive?  No - Patient declined No - Patient declined No - Patient declined  Yes (MAU/Ambulatory/Procedural Areas  - Information given)     Current Medications (verified) Outpatient Encounter Medications as of 10/04/2023  Medication Sig   albuterol (VENTOLIN HFA) 108 (90 Base) MCG/ACT inhaler Inhale 1 puff into the lungs every 6 (six) hours as needed for wheezing or shortness of breath.   amiodarone (PACERONE) 200 MG tablet Take 1 tablet (200 mg total) by mouth 2 (two) times daily.   Ascorbic Acid (VITAMIN C) 100 MG tablet Take 100 mg by mouth daily.   atorvastatin (LIPITOR) 80 MG tablet TAKE 1 TABLET EVERY EVENING   ibuprofen (ADVIL) 200 MG tablet Take 400 mg by mouth every 8 (eight) hours as needed for moderate pain.   metoprolol succinate (TOPROL-XL) 25 MG 24 hr tablet Take 1 tablet (25 mg total) by mouth daily.   mexiletine (MEXITIL) 200 MG capsule Take 1 capsule (200 mg total) by mouth 3 (three) times daily for 120 doses.   Multiple Vitamins-Minerals (MULTIVITAMIN WITH MINERALS) tablet Take 1 tablet by mouth daily.   OVER THE COUNTER MEDICATION Take 1 tablet by mouth daily. Healthy eye care   potassium chloride SA (KLOR-CON M) 20 MEQ tablet Take 1 tablet (20 mEq total) by mouth daily.   sacubitril-valsartan (ENTRESTO) 49-51 MG Take 1 tablet by mouth 2 (two) times daily.   sotalol (BETAPACE) 80 MG tablet Take 1 tablet (80 mg total) by mouth 2 (two) times daily.   spironolactone (ALDACTONE) 25 MG tablet TAKE 1 TABLET EVERY DAY   torsemide (  DEMADEX) 20 MG tablet TAKE 3 TABLETS EVERY MORNING AND TAKE 2 TABLETS EVERY EVENING   XARELTO 20 MG TABS tablet TAKE 1 TABLET DAILY WITH SUPPER.   No facility-administered encounter medications on file as of 10/04/2023.    Allergies (verified) Sglt2 inhibitors   History: Past Medical History:  Diagnosis Date   AICD (automatic cardioverter/defibrillator) present    Arthritis    Atrial fibrillation (HCC)    CAD (coronary artery disease)    Mild nonobstructive (Cath 09)   Chronic systolic heart failure (HCC)    Nonischemic CM: echo 4/12 with EF 25-30%, grade  2 diast dysfxn, mild dilated aortic root 43 mm, trivial MR, mod LAE   COPD (chronic obstructive pulmonary disease) (HCC)    Dyspnea    with exertion    Ejection fraction < 50% 08/2019   20-25% noted on ECHO   H/O ETOH abuse    HTN (hypertension)    Hypersomnia    Nonischemic cardiomyopathy (HCC)    Obesity, morbid (HCC)    OSA on CPAP    Systolic and diastolic CHF, acute on chronic (HCC) 11/2016   Tobacco use disorder    Ventricular tachycardia (HCC)    Past Surgical History:  Procedure Laterality Date   CARDIAC CATHETERIZATION N/A 04/08/2015   Procedure: Right/Left Heart Cath and Coronary Angiography;  Surgeon: Corky Crafts, MD;  Location: [facility] INVASIVE CV LAB;  Service: Cardiovascular;  Laterality: N/A;   EP IMPLANTABLE DEVICE N/A 04/14/2015   Procedure: BiV ICD Insertion CRT-D;  Surgeon: [name], MD;  Location: [facility] INVASIVE CV LAB;  Service: Cardiovascular;  Laterality: N/A;   HYDROCELE EXCISION / REPAIR     ICD GENERATOR CHANGEOUT N/A 02/20/2022   Procedure: ICD GENERATOR CHANGEOUT;  Surgeon: [name], MD;  Location: [facility] INVASIVE CV LAB;  Service: Cardiovascular;  Laterality: N/A;   ICD IMPLANT     RIGHT/LEFT HEART CATH AND CORONARY ANGIOGRAPHY N/A 06/07/2023   Procedure: RIGHT/LEFT HEART CATH AND CORONARY ANGIOGRAPHY;  Surgeon: [name], MD;  Location: [facility] INVASIVE CV LAB;  Service: Cardiovascular;  Laterality: N/A;   TOTAL HIP ARTHROPLASTY Right 11/04/2018   Procedure: TOTAL HIP ARTHROPLASTY ANTERIOR APPROACH;  Surgeon: [name], MD;  Location: [facility] ORS;  Service: Orthopedics;  Laterality: Right;   TOTAL KNEE ARTHROPLASTY Right 01/30/2020   Procedure: TOTAL KNEE ARTHROPLASTY;  Surgeon: [name], MD;  Location: [facility] ORS;  Service: Orthopedics;  Laterality: Right;   TOTAL KNEE ARTHROPLASTY Left 07/30/2020   Procedure: TOTAL KNEE ARTHROPLASTY;  Surgeon: [name], MD;  Location: [facility] ORS;  Service: Orthopedics;  Laterality: Left;   TUMOR EXCISION Left     TUMOR EXCISION Left 1982   Benign tumor removed from L leg   VASECTOMY     Family History  Problem Relation Age of Onset   Other Mother        cardiac surgery. late 1990s   Heart Problems Mother        CABG AGE 13   Congestive Heart Failure Father    Healthy Father        AGE 33   Coronary artery disease Other    Healthy Brother        AGE 75   Healthy Sister        AGE 67   Healthy Sister        AGE 69   Healthy Son    Healthy Son    Healthy Daughter    Social History   Socioeconomic  History   Marital status: Single    Spouse name: Not on file   Number of children: 2   Years of education: Not on file   Highest education level: Not on file  Occupational History   Occupation: scrap metal   Occupation: disabled, odd jobs  Tobacco Use   Smoking status: Former    Current packs/day: 0.00    Average packs/day: 0.5 packs/day for 4.0 years (2.0 ttl pk-yrs)    Types: Cigarettes    Start date: 10/29/2001    Quit date: 10/29/2005    Years since quitting: 17.9   Smokeless tobacco: Never   Tobacco comments:    about a pack a week. social  Vaping Use   Vaping status: Never Used  Substance and Sexual Activity   Alcohol use: Yes    Comment: 12 beers per week    Drug use: Yes    Types: Marijuana   Sexual activity: Yes    Partners: Female  Other Topics Concern   Not on file  Social History Narrative   ** Merged History Encounter **       Social Drivers of Health   Financial Resource Strain: Low Risk  (10/04/2023)   Overall Financial Resource Strain (CARDIA)    Difficulty of Paying Living Expenses: Not hard at all  Food Insecurity: No Food Insecurity (10/04/2023)   Hunger Vital Sign    Worried About Running Out of Food in the Last Year: Never true    Ran Out of Food in the Last Year: Never true  Transportation Needs: No Transportation Needs (10/04/2023)   PRAPARE - Administrator, Civil Service (Medical): No    Lack of Transportation (Non-Medical): No   Physical Activity: Insufficiently Active (10/04/2023)   Exercise Vital Sign    Days of Exercise per Week: 3 days    Minutes of Exercise per Session: 30 min  Stress: No Stress Concern Present (10/04/2023)   [employer] of Occupational Health - Occupational Stress Questionnaire    Feeling of Stress : Not at all  Social Connections: Socially Isolated (10/04/2023)   Social Connection and Isolation Panel [NHANES]    Frequency of Communication with Friends and Family: Three times a week    Frequency of Social Gatherings with Friends and Family: Twice a week    Attends Religious Services: Never    Database administrator or Organizations: No    Attends Banker Meetings: Never    Marital Status: Divorced    Tobacco Counseling Counseling given: Not Answered Tobacco comments: about a pack a week. social   Clinical Intake:  Pre-visit preparation completed: Yes  Pain : No/denies pain     Nutritional Risks: None Diabetes: No  How often do you need to have someone help you when you read instructions, pamphlets, or other written materials from your doctor or pharmacy?: 1 - Never  Interpreter Needed?: No  Information entered by :: NAllen LPN   Activities of Daily Living    10/04/2023    9:32 AM 06/07/2023    2:49 PM  In your present state of health, do you have any difficulty performing the following activities:  Hearing? 0   Vision? 0   Difficulty concentrating or making decisions? 0   Walking or climbing stairs? 0   Dressing or bathing? 0   Doing errands, shopping? 0 0  Preparing Food and eating ? N   Using the Toilet? N   In the past six months, have you  accidently leaked urine? N   Do you have problems with loss of bowel control? N   Managing your Medications? N   Managing your Finances? N   Housekeeping or managing your Housekeeping? N     Patient Care Team: [name], NP as PCP - General (Nurse Practitioner) Dolores Patty, MD as PCP -  Advanced Heart Failure (Cardiology) [name], MD as PCP - Electrophysiology (Cardiology) [name], MD as PCP - Cardiology (Cardiology) [name], MD (Family Medicine) [name], LCSW as Social Worker (Licensed Clinical Social Worker)  Indicate any recent [employer] you may have received from other than [facility] providers in the past year (date may be approximate).     Assessment:   This is a routine wellness examination for [place].  Hearing/Vision screen Hearing Screening - Comments:: Denies hearing issues Vision Screening - Comments:: Regular eye exams, [facility]   Goals Addressed             This Visit's Progress    Patient Stated       10/04/2023, wants to lose weight and drink more water       Depression Screen    10/04/2023    9:38 AM 06/20/2023    3:39 PM 05/15/2021   10:23 AM 04/15/2021   10:40 AM  PHQ 2/9 Scores  PHQ - 2 Score 0 0 0 0  PHQ- 9 Score   3     Fall Risk    10/04/2023    9:37 AM 06/20/2023    3:38 PM 05/15/2021   10:22 AM  Fall Risk   Falls in the past year? 0 0 0  Number falls in past yr: 0 0 0  Injury with Fall? 0 0 0  Risk for fall due to : Medication side effect No Fall Risks No Fall Risks  Follow up Falls prevention discussed;Falls evaluation completed  Falls evaluation completed    MEDICARE RISK AT HOME: Medicare Risk at Home Any stairs in or around the home?: Yes If so, are there any without handrails?: No Home free of loose throw rugs in walkways, pet beds, electrical cords, etc?: Yes Adequate lighting in your home to reduce risk of falls?: Yes Life alert?: No Use of a cane, walker or w/c?: No Grab bars in the bathroom?: Yes Shower chair or bench in shower?: Yes Elevated toilet seat or a handicapped toilet?: No  TIMED UP AND GO:  Was the test performed?  No    Cognitive Function:        10/04/2023    9:39 AM 05/15/2021   10:24 AM  6CIT Screen  What Year? 0 points 0 points  What  month? 0 points 0 points  What time? 0 points 0 points  Count back from 20 0 points 0 points  Months in reverse 0 points 0 points  Repeat phrase 0 points 0 points  Total Score 0 points 0 points    Immunizations Immunization History  Administered Date(s) Administered   Influenza, Quadrivalent, Recombinant, Inj, Pf 05/22/2018   Influenza, Seasonal, Injecte, Preservative Fre 06/08/2023   Influenza,inj,Quad PF,6+ Mos 05/31/2017, 11/21/2019   Pneumococcal Polysaccharide-23 11/21/2019   Tdap 01/28/2014    TDAP status: Up to date  Flu Vaccine status: Up to date  Pneumococcal vaccine status: Due, Education has been provided regarding the importance of this vaccine. Advised may receive this vaccine at local pharmacy or Health Dept. Aware to provide a copy of the vaccination record  if obtained from local pharmacy or Health Dept. Verbalized acceptance and understanding.  Covid-19 vaccine status: Declined, Education has been provided regarding the importance of this vaccine but patient still declined. Advised may receive this vaccine at local pharmacy or Health Dept.or vaccine clinic. Aware to provide a copy of the vaccination record if obtained from local pharmacy or Health Dept. Verbalized acceptance and understanding.  Qualifies for Shingles Vaccine? Yes   Zostavax completed No   Shingrix Completed?: No.    Education has been provided regarding the importance of this vaccine. Patient has been advised to call insurance company to determine out of pocket expense if they have not yet received this vaccine. Advised may also receive vaccine at local pharmacy or Health Dept. Verbalized acceptance and understanding.  Screening Tests Health Maintenance  Topic Date Due   Hepatitis C Screening  Never done   Zoster Vaccines- Shingrix (1 of 2) Never done   Colonoscopy  Never done   Pneumococcal Vaccine 25-19 Years old (2 of 2 - PCV) 11/20/2020   COVID-19 Vaccine (1) 10/20/2023 (Originally 12/13/1965)    DTaP/Tdap/Td (2 - Td or Tdap) 01/29/2024   Medicare Annual Wellness (AWV)  10/03/2024   INFLUENZA VACCINE  Completed   HIV Screening  Completed   HPV VACCINES  Aged Out    Health Maintenance  Health Maintenance Due  Topic Date Due   Hepatitis C Screening  Never done   Zoster Vaccines- Shingrix (1 of 2) Never done   Colonoscopy  Never done   Pneumococcal Vaccine 53-63 Years old (2 of 2 - PCV) 11/20/2020    Colorectal cancer screening: Type of screening: Cologuard. Completed 05/25/2021. Repeat every 3 years  Lung Cancer Screening: (Low Dose CT Chest recommended if Age 32-80 years, 20 pack-year currently smoking OR have quit w/in 15years.) does not qualify.   Lung Cancer Screening Referral: no  Additional Screening:  Hepatitis C Screening: does qualify;  Vision Screening: Recommended annual ophthalmology exams for early detection of glaucoma and other disorders of the eye. Is the patient up to date with their annual eye exam?  Yes  Who is the provider or what is the name of the office in which the patient attends annual eye exams? [facility] If pt is not established with a provider, would they like to be referred to a provider to establish care? No .   Dental Screening: Recommended annual dental exams for proper oral hygiene  Diabetic Foot Exam: n/a  Community Resource Referral / Chronic Care Management: CRR required this visit?  No   CCM required this visit?  No     Plan:     I have personally reviewed and noted the following in the patient's chart:   Medical and social history Use of alcohol, tobacco or illicit drugs  Current medications and supplements including opioid prescriptions. Patient is not currently taking opioid prescriptions. Functional ability and status Nutritional status Physical activity Advanced directives List of other physicians Hospitalizations, surgeries, and ER visits in previous 12 months Vitals Screenings to include cognitive,  depression, and falls Referrals and appointments  In addition, I have reviewed and discussed with patient certain preventive protocols, quality metrics, and best practice recommendations. A written personalized care plan for preventive services as well as general preventive health recommendations were provided to patient.     Barb Merino, LPN   0/45/4098   After Visit Summary: (MyChart) Due to this being a telephonic visit, the after visit summary with patients personalized plan was  offered to patient via MyChart   Nurse Notes: none

## 2023-10-22 ENCOUNTER — Ambulatory Visit: Payer: Medicare HMO | Attending: Internal Medicine

## 2023-10-22 DIAGNOSIS — Z95810 Presence of automatic (implantable) cardiac defibrillator: Secondary | ICD-10-CM

## 2023-10-22 DIAGNOSIS — I5022 Chronic systolic (congestive) heart failure: Secondary | ICD-10-CM

## 2023-10-22 DIAGNOSIS — I472 Ventricular tachycardia, unspecified: Secondary | ICD-10-CM | POA: Diagnosis not present

## 2023-10-22 DIAGNOSIS — Z950 Presence of cardiac pacemaker: Secondary | ICD-10-CM | POA: Diagnosis not present

## 2023-10-23 ENCOUNTER — Telehealth: Payer: Self-pay

## 2023-10-23 NOTE — Telephone Encounter (Signed)
Pt returned nurse call.

## 2023-10-23 NOTE — Telephone Encounter (Signed)
 Follow up call for SK to see if patient had appointment with Hranitzky MD for VT ablation. Also to assess symptoms r/t 7 additional treated slow VT episodes since last being seen in clinic. See paceart. Has been relatively asymptomatic despite high VT burden as of late.

## 2023-10-23 NOTE — Telephone Encounter (Signed)
 Erroneous note

## 2023-10-23 NOTE — Telephone Encounter (Signed)
 Spoke to patient:   He saw Dr. Sharron yesterday, they are scheduling him for a VT ablation, he is waiting for appointment. He recalls some mild dizziness over the weekend likely during event times but otherwise doing well, no symptoms.   He is taking all of his medications.  No missed doses. Forwarding to Augustin Quivers RN and Dr. Fernande for follow up and review.

## 2023-10-23 NOTE — Telephone Encounter (Signed)
Routing to [employer]

## 2023-10-25 ENCOUNTER — Other Ambulatory Visit (HOSPITAL_COMMUNITY): Payer: Self-pay | Admitting: Cardiology

## 2023-11-05 ENCOUNTER — Telehealth: Payer: Self-pay | Admitting: *Deleted

## 2023-11-05 NOTE — Telephone Encounter (Signed)
   Pre-operative Risk Assessment    Patient Name: [name] [name]  DOB: 02-12-1961 MRN: [id number]   Date of last office visit: 09/28/23 DR. [name] Date of next office visit:  01/09/24 DR. [name]  Request for Surgical Clearance    Procedure:  Dental Extraction - Amount of Teeth to be Pulled:  22 SURGICALLY EXTRACTED, BONE GRAFT; PREPARE FOR DENTURES  Date of Surgery:  Clearance TBD                                Surgeon:  DR. Evelene Croon, DMD Surgeon's Group or Practice Name:  [facility] Phone number:  [phone] Fax number:  [phone]   Type of Clearance Requested:   - Medical  - Pharmacy:  Hold Rivaroxaban (Xarelto)     Type of Anesthesia:  Local  AND NUMBING AGENTS (LIDOCAINE, NOVOCAINE TC)    Additional requests/questions:    Elpidio Anis   11/05/2023, 11:24 AM

## 2023-11-06 ENCOUNTER — Telehealth: Payer: Self-pay

## 2023-11-06 NOTE — Telephone Encounter (Signed)
Unscheduled remote transmission: PII.  Observation: Unable to measure LV thresholds in last 7 days. Normal device function. 7 VT episodes treated with ATP, longest 43 sec on 10/06/23 at 04:06 treated with ATP x 3 Bursts, most recent on 10/16/23 at 00:31, EGMs c/w V>A conduction treated with 1 Burst of ATP (1 episode was treated with 3 bursts), all successfully terminated except episode #1135 self terminated after unsuccessful ATP.  Average V rates 133-150 bpm.  Routed to clinic for review (see PDF of 5645 KB). 626 VT-NS classified episodes since 09/28/23, longest 4 sec (device defined), V>A conduction, average V rates 133-158 bpm. Follow up as scheduled. [facility], [facility]   Treated VT episodes before 2/4 reviewed with Dr. Graciela Husbands on 2/4. No changes at that time. Pt scheduled for VT ablation. No new treated episodes of VT since 1/28. Will review increased NSVT w/ Graciela Husbands MD Pt asymptomatic. Does not feel any different at any point. Pleasant gentleman and thankful for call.

## 2023-11-06 NOTE — Telephone Encounter (Signed)
Dr. Graciela Husbands,  You saw this patient on 09/28/2023. Per office protocol, will you please comment on medical clearance for extraction of 22 teeth in preparation of dentures?  Please route your response to P CV DIV Preop. I will communicate with requesting office once you have given recommendations.   Thank you!  Carlos Levering, NP

## 2023-11-06 NOTE — Telephone Encounter (Signed)
Please advise holding Xarelto prior to extraction of 22 teeth and bone graft.  Thank you!  DW

## 2023-11-07 ENCOUNTER — Encounter: Payer: Self-pay | Admitting: Internal Medicine

## 2023-11-07 NOTE — Telephone Encounter (Signed)
Patient with diagnosis of afib on Xarelto for anticoagulation.    Procedure: 22 SURGICALLY EXTRACTED, BONE GRAFT; PREPARE FOR DENTURES  Date of procedure: TBD   CHA2DS2-VASc Score = 3   This indicates a 3.2% annual risk of stroke. The patient's score is based upon: CHF History: 1 HTN History: 1 Diabetes History: 0 Stroke History: 0 Vascular Disease History: 1 Age Score: 0 Gender Score: 0      CrCl 77 ml/min Platelet count 165  Patient does NOT require pre-op antibiotics for dental procedure.  Per office protocol, patient can hold Xarelto for 1 day prior to procedure.    **This guidance is not considered finalized until pre-operative APP has relayed final recommendations.**

## 2023-11-07 NOTE — Progress Notes (Signed)
PERIOPERATIVE PRESCRIPTION FOR IMPLANTED CARDIAC DEVICE PROGRAMMING   Patient Information:  Patient: [name] [name]  MRN: [id number]  Date of Birth: 24-Feb-1961    Surgeon:  DR. Evelene Croon, DMD Surgeon's Group or Practice Name:  [facility] Phone number:  [phone] Fax number:  [phone] Planned Procedure:    Dental Extraction - Amount of Teeth to be Pulled:  22 SURGICALLY EXTRACTED, BONE GRAFT; PREPARE FOR DENTURES  Date of Procedure:  TBD    Device Information:   Clinic EP Physician:   Dr. Sherryl Manges Device Type:  Defibrillator Manufacturer and Phone #:  [employer]: [phone] Pacemaker Dependent?:  No Date of Last Device Check:  11/04/2023        Normal Device Function?:  Yes     Electrophysiologist's Recommendations:   Have magnet available. Provide continuous ECG monitoring when magnet is used or reprogramming is to be performed.  Procedure may interfere with device function.  Magnet should be placed over device during procedure. Per Device Clinic Standing Orders, [name]  11/07/2023

## 2023-11-07 NOTE — Telephone Encounter (Signed)
Discussed with Dr. Graciela Husbands.  No new orders received.

## 2023-11-08 NOTE — Telephone Encounter (Signed)
   Patient Name: [name] [name]  DOB: [birth year] MRN: [id number]  Primary Cardiologist: Marca Ancona, MD  Chart reviewed as part of pre-operative protocol coverage. Given past medical history and time since last visit, based on ACC/AHA guidelines, Khamron Mcquiston is at acceptable risk for the planned procedure without further cardiovascular testing.   If his ablation is scheduled in the near future, I would have him delay his mouth procedure so as to avoid the sympathetic stresses which could trigger -- to the dentist's anxiety--ventricular tachycardia Also this procedure would need to be done in hospital not in the office   Ablation is not yet scheduled.  Thanks SK    Patient does NOT require pre-op antibiotics for dental procedure.   Per office protocol, patient can hold Xarelto for 1 day prior to procedure.      The patient was advised that if he develops new symptoms prior to surgery to contact our office to arrange for a follow-up visit, and he verbalized understanding.  I will route this recommendation to the requesting party via Epic fax function and remove from pre-op pool.  Please call with questions.Call Dr. Odessa Fleming office for details.   Joni Reining, NP 11/08/2023, 4:07 PM

## 2023-11-08 NOTE — Telephone Encounter (Signed)
If his ablation is scheduled in the near future, I would have him delay his mouth procedure so as to avoid the sympathetic stresses which could trigger -- to the dentist's anxiety--ventricular tachycardia Also this procedure would need to be done in hospital not in the office  /Thanks SK

## 2023-11-13 DIAGNOSIS — R0981 Nasal congestion: Secondary | ICD-10-CM | POA: Diagnosis not present

## 2023-11-13 DIAGNOSIS — R059 Cough, unspecified: Secondary | ICD-10-CM | POA: Diagnosis not present

## 2023-11-13 DIAGNOSIS — J0110 Acute frontal sinusitis, unspecified: Secondary | ICD-10-CM | POA: Diagnosis not present

## 2023-11-21 ENCOUNTER — Ambulatory Visit: Payer: Medicare HMO

## 2023-11-21 DIAGNOSIS — I428 Other cardiomyopathies: Secondary | ICD-10-CM | POA: Diagnosis not present

## 2023-11-23 LAB — CUP PACEART REMOTE DEVICE CHECK
Battery Remaining Longevity: 67 mo
Battery Voltage: 2.98 V
Brady Statistic AP VP Percent: 92.18 %
Brady Statistic AP VS Percent: 7.65 %
Brady Statistic AS VP Percent: 0.15 %
Brady Statistic AS VS Percent: 0.02 %
Brady Statistic RA Percent Paced: 99.8 %
Brady Statistic RV Percent Paced: 92.98 %
Date Time Interrogation Session: 20250305012303
HighPow Impedance: 69 Ohm
Implantable Lead Connection Status: 753985
Implantable Lead Connection Status: 753985
Implantable Lead Connection Status: 753985
Implantable Lead Implant Date: 20160727
Implantable Lead Implant Date: 20160727
Implantable Lead Implant Date: 20160727
Implantable Lead Location: 753858
Implantable Lead Location: 753859
Implantable Lead Location: 753860
Implantable Lead Model: 4598
Implantable Lead Model: 5076
Implantable Lead Model: 6935
Implantable Pulse Generator Implant Date: 20230605
Lead Channel Impedance Value: 180.5 Ohm
Lead Channel Impedance Value: 189.525
Lead Channel Impedance Value: 189.525
Lead Channel Impedance Value: 189.525
Lead Channel Impedance Value: 199.5 Ohm
Lead Channel Impedance Value: 361 Ohm
Lead Channel Impedance Value: 361 Ohm
Lead Channel Impedance Value: 361 Ohm
Lead Channel Impedance Value: 399 Ohm
Lead Channel Impedance Value: 399 Ohm
Lead Channel Impedance Value: 399 Ohm
Lead Channel Impedance Value: 418 Ohm
Lead Channel Impedance Value: 456 Ohm
Lead Channel Impedance Value: 608 Ohm
Lead Channel Impedance Value: 608 Ohm
Lead Channel Impedance Value: 608 Ohm
Lead Channel Impedance Value: 646 Ohm
Lead Channel Impedance Value: 665 Ohm
Lead Channel Pacing Threshold Amplitude: 0.5 V
Lead Channel Pacing Threshold Amplitude: 0.75 V
Lead Channel Pacing Threshold Amplitude: 1.25 V
Lead Channel Pacing Threshold Pulse Width: 0.4 ms
Lead Channel Pacing Threshold Pulse Width: 0.4 ms
Lead Channel Pacing Threshold Pulse Width: 0.4 ms
Lead Channel Sensing Intrinsic Amplitude: 1.625 mV
Lead Channel Sensing Intrinsic Amplitude: 1.625 mV
Lead Channel Sensing Intrinsic Amplitude: 3.375 mV
Lead Channel Sensing Intrinsic Amplitude: 7.5 mV
Lead Channel Setting Pacing Amplitude: 1.5 V
Lead Channel Setting Pacing Amplitude: 1.75 V
Lead Channel Setting Pacing Amplitude: 2 V
Lead Channel Setting Pacing Pulse Width: 0.4 ms
Lead Channel Setting Pacing Pulse Width: 0.4 ms
Lead Channel Setting Sensing Sensitivity: 0.3 mV

## 2023-11-26 ENCOUNTER — Ambulatory Visit: Payer: Medicare HMO | Attending: Internal Medicine

## 2023-11-26 DIAGNOSIS — Z95810 Presence of automatic (implantable) cardiac defibrillator: Secondary | ICD-10-CM | POA: Diagnosis not present

## 2023-11-26 DIAGNOSIS — I5022 Chronic systolic (congestive) heart failure: Secondary | ICD-10-CM

## 2023-11-29 ENCOUNTER — Telehealth (HOSPITAL_COMMUNITY): Payer: Self-pay

## 2023-11-29 NOTE — Progress Notes (Signed)
 EPIC Encounter for ICM Monitoring  Patient Name: [name] [name] is a 63 y.o. male Date: 11/29/2023 Primary Care Physican: [name], NP Primary Cardiologist: Shirlee Latch Electrophysiologist: Graciela Husbands BiV Pacing: 88.4% 03/21/2023 Weight: 320 lbs 07/06/2023 Weight: 320 lbs 11/29/2023 Weight: 323 lbs   Clinical Status Since 21-Nov-2023 Monitored VT-NS (>4 beats, >150 bpm)   1     Spoke with patient and heart failure questions reviewed.  Transmission results reviewed.  Pt asymptomatic for fluid accumulation.  Reports feeling well at this time and voices no complaints.      Optivol Thoracic impedance suggesting normal fluid levels since 2/3.   Dr Conley Simmonds in [place] and scheduled him for a VT ablation 4/7.    Prescribed:  Torsemide 20 mg 3 tablets (60 mg total) by mouth every other day alternating with 2 tablets (40 mg total) every other day.  Per 10/4 HF clinic note, discussed with patient when to increase to 60 mg bid (wt gain, SOB, swelling)  Potassium 20 mEq take 1 tablet by mouth daily Spironolactone 25 mg take 1 tablet daily.   Labs: 07/17/2023 Creatinine 1.64, BUN 18, Potassium 5.0, Sodium 139, GFR 48 07/02/2023 Creatinine 1.57, BUN 27, Potassium 4.2, Sodium 140  06/22/2023 Creatinine 1.27, BUN 18, Potassium 4.1, Sodium 139, GFR >60  06/08/2023 Creatinine 1.27, BUN 18, Potassium 3.5, Sodium 141, GFR >60  06/07/2023 Creatinine 1.60, BUN 22, Potassium 3.5, Sodium 140, GFR 49 06/04/2023 Creatinine 1.78, BUN 43, Potassium 4.5, Sodium 141  04/20/2023 Creatinine 1.59, BUN 27, Potassium 4.2, Sodium 140, GFR 49  A complete set of results can be found in Results Review.   Recommendations:  No changes and encouraged to call if experiencing any fluid symptoms.   Follow-up plan: ICM clinic phone appointment on 12/31/2023.   91 day device clinic remote transmission 02/20/2024.     EP/Cardiology Office Visits:  01/09/2024 with Dr Graciela Husbands.  Advised to call Dr Alford Highland office for appointment. Last HF  clinic visit was 2023.   Copy of ICM check sent to Dr. Graciela Husbands.   3 month ICM trend: 11/26/2023.    12-14 Month ICM trend:     [name], RN 11/29/2023 7:59 AM

## 2023-11-29 NOTE — Telephone Encounter (Signed)
 Advanced Heart Failure Patient Advocate Encounter  Application for Entresto faxed to [employer] on 11/29/2023. Application form attached to patient chart.  Burnell Blanks, CPhT Rx Patient Advocate Phone: [phone]

## 2023-12-03 NOTE — Telephone Encounter (Signed)
 [employer] is requesting proof of income before making a determination. Left voicemail for patient.

## 2023-12-10 NOTE — Telephone Encounter (Signed)
 Spoke to patient by phone, he will fax income records to [facility] later this week.

## 2023-12-11 NOTE — Telephone Encounter (Signed)
 Proof of income faxed to [employer] on 12/11/2023.

## 2023-12-14 ENCOUNTER — Other Ambulatory Visit (HOSPITAL_COMMUNITY): Payer: Self-pay | Admitting: Cardiology

## 2023-12-14 MED ORDER — AMIODARONE HCL 200 MG PO TABS: 200.0000 mg | ORAL_TABLET | Freq: Two times a day (BID) | ORAL | 2 refills | Status: DC

## 2023-12-17 NOTE — Telephone Encounter (Signed)
 Attempted to contact [employer] for status update. The office is currently 'unable to accept phone calls at this time' Will continue to follow up.

## 2023-12-24 ENCOUNTER — Other Ambulatory Visit (HOSPITAL_COMMUNITY): Payer: Self-pay

## 2023-12-24 NOTE — Telephone Encounter (Signed)
 Spoke to [employer] for status update. Rep stated that POI records have not been received, and a copy of the insurance card is needed. POI and insurance card faxed to [employer] on 12/24/2023.

## 2023-12-29 ENCOUNTER — Encounter: Payer: Self-pay | Admitting: Internal Medicine

## 2023-12-31 ENCOUNTER — Telehealth: Payer: Self-pay

## 2023-12-31 ENCOUNTER — Ambulatory Visit: Attending: Internal Medicine

## 2023-12-31 DIAGNOSIS — Z95810 Presence of automatic (implantable) cardiac defibrillator: Secondary | ICD-10-CM | POA: Diagnosis not present

## 2023-12-31 DIAGNOSIS — I5022 Chronic systolic (congestive) heart failure: Secondary | ICD-10-CM

## 2023-12-31 NOTE — Telephone Encounter (Signed)
 Sent a message to [facility] to reconnect him there for consideration of advanced threapies and willwait and see what PH has to say from [place].

## 2023-12-31 NOTE — Telephone Encounter (Addendum)
 Alert received from [employer]:  Alert remote transmission:  VT/VF with successful HV therapy Event occurred 4/13 @00 :11, duration 59sec, HR 136-140, V>A, ATP x8 followed by 35J of HV therapy converting arrhythmia.  There are 7 additional NSVT 4/12 21:31-4/13 00:11, HR's 136-148.  Per EPIC pt had VT ablation scheduled 4/11.    Outreach made to Pt.  He states he remembers this episode.  States he was lying in his bed.  He denies chest pain or SOB affiliated with episode.  He states Dr. Fausto Hooker advised him this could happen.  He states he is compliant with all antiarrhythmic medication:  amiodarone, metoprolol succinate and sotalol.  He states he is not taking mexiletine-removed from med list.  Pt with recent lab work-potassium and magnesium WNL.  Pt states he has not had any further therapies.  Advised this nurse has sent a message to NP for Dr. Fausto Hooker.  Will also forward to Dr. Rodolfo Clan for review.

## 2024-01-01 ENCOUNTER — Telehealth: Payer: Self-pay

## 2024-01-01 NOTE — Telephone Encounter (Signed)
 Pt has been scheduled to see Dr Mitzie Anda on 01/09/2024.

## 2024-01-01 NOTE — Telephone Encounter (Signed)
 Remote ICM transmission received.  Attempted call to patient regarding ICM remote transmission and no answer.

## 2024-01-01 NOTE — Progress Notes (Signed)
 EPIC Encounter for ICM Monitoring  Patient Name: [name] [name] is a 63 y.o. 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   Transmission results reviewed.    Optivol Thoracic impedance suggesting normal fluid levels with the exception of possible fluid accumulation from 4/8-4/10.   Followed by Dr Achilles Holes in [place].  4/13 ICD Shock as been addressed by Device clinic triage and Dr Rodolfo Clan.    Prescribed:  Torsemide 20 mg 3 tablets (60 mg total) by mouth every other day alternating with 2 tablets (40 mg total) every other day.  Per 10/4 HF clinic note, discussed with patient when to increase to 60 mg bid (wt gain, SOB, swelling)  Potassium 20 mEq take 1 tablet by mouth daily Spironolactone 25 mg take 1 tablet daily.   Labs: 12/28/2023 Creatinine 1.33, BUN 29, Potassium 4.5, Sodium 142, GFR 61 12/21/2023 Creatinine 1.89, BUN 46, Potassium 5.1, Sodium 143, GFR 40 07/17/2023 Creatinine 1.64, BUN 18, Potassium 5.0, Sodium 139, GFR 48 07/02/2023 Creatinine 1.57, BUN 27, Potassium 4.2, Sodium 140  A complete set of results can be found in Results Review.   Recommendations:  Unable to reach.     Follow-up plan: ICM clinic phone appointment on 02/04/2024.   91 day device clinic remote transmission 02/20/2024.     EP/Cardiology Office Visits:  01/09/2024 with Dr Rodolfo Clan.   01/09/2024 with Dr Mitzie Anda.   Copy of ICM check sent to Dr. Rodolfo Clan.   3 month ICM trend: 12/31/2023.    12-14 Month ICM trend:     Almyra Jain, RN 01/01/2024 5:02 PM

## 2024-01-02 ENCOUNTER — Telehealth (HOSPITAL_COMMUNITY): Payer: Self-pay

## 2024-01-02 ENCOUNTER — Other Ambulatory Visit (HOSPITAL_COMMUNITY): Payer: Self-pay

## 2024-01-02 NOTE — Telephone Encounter (Signed)
 [employer] is now only accepting 1040 for proof of income. This patient is eligible for a grant that will not require additional information be sent in. Will obtain grant in separate encounter.

## 2024-01-02 NOTE — Telephone Encounter (Signed)
 Reviewed episode with Dellis Fermo NP for Dr. Fausto Hooker.  Per Dr. Fausto Hooker, he had a hard time inducing VT during recent ablation.   Per Dr. Fausto Hooker could consider going up on amiodarone, but unsure if that will work.  They agree with referral to Advance Heart Failure.  Will continue to monitor.

## 2024-01-02 NOTE — Telephone Encounter (Signed)
 Advanced Heart Failure Patient Advocate Encounter  The patient was approved for a [employer] grant that will help cover the cost of Entresto, Metoprolol, Spironolactone.  Total amount awarded, $10,000.  Effective: 12/03/2023 - 12/01/2024.  BIN [id number] PCN [id number] Group [id number] ID [id number]  Pharmacy provided with approval and processing information. Patient informed via phone.  Kennis Peacock, CPhT Rx Patient Advocate Phone: [phone]

## 2024-01-03 MED ORDER — ENTRESTO 49-51 MG PO TABS: 1.0000 | ORAL_TABLET | Freq: Two times a day (BID) | ORAL | 3 refills | Status: DC

## 2024-01-04 NOTE — Addendum Note (Signed)
 Addended by: Lott Rouleau A on: 01/04/2024 10:24 AM   Modules accepted: Orders

## 2024-01-04 NOTE — Progress Notes (Signed)
 Remote ICD transmission.

## 2024-01-09 ENCOUNTER — Ambulatory Visit
Admission: RE | Admit: 2024-01-09 | Discharge: 2024-01-09 | Disposition: A | Discharge status: Home / Self Care | Source: Ambulatory Visit | Attending: Cardiology | Admitting: Cardiology

## 2024-01-09 ENCOUNTER — Ambulatory Visit (INDEPENDENT_AMBULATORY_CARE_PROVIDER_SITE_OTHER): Payer: Medicare HMO | Admitting: Internal Medicine

## 2024-01-09 ENCOUNTER — Encounter: Payer: Self-pay | Admitting: Internal Medicine

## 2024-01-09 ENCOUNTER — Encounter (HOSPITAL_COMMUNITY): Payer: Self-pay | Admitting: Cardiology

## 2024-01-09 VITALS — BP 90/50 | HR 77 | Wt 330.0 lb

## 2024-01-09 VITALS — BP 110/78 | HR 93 | Ht 77.0 in | Wt 329.0 lb

## 2024-01-09 DIAGNOSIS — I428 Other cardiomyopathies: Secondary | ICD-10-CM | POA: Insufficient documentation

## 2024-01-09 DIAGNOSIS — I472 Ventricular tachycardia, unspecified: Secondary | ICD-10-CM

## 2024-01-09 DIAGNOSIS — F32A Depression, unspecified: Secondary | ICD-10-CM | POA: Diagnosis not present

## 2024-01-09 DIAGNOSIS — I4729 Other ventricular tachycardia: Secondary | ICD-10-CM | POA: Insufficient documentation

## 2024-01-09 DIAGNOSIS — I110 Hypertensive heart disease with heart failure: Secondary | ICD-10-CM | POA: Insufficient documentation

## 2024-01-09 DIAGNOSIS — F1010 Alcohol abuse, uncomplicated: Secondary | ICD-10-CM

## 2024-01-09 DIAGNOSIS — Z95810 Presence of automatic (implantable) cardiac defibrillator: Secondary | ICD-10-CM | POA: Diagnosis not present

## 2024-01-09 DIAGNOSIS — F419 Anxiety disorder, unspecified: Secondary | ICD-10-CM | POA: Insufficient documentation

## 2024-01-09 DIAGNOSIS — I480 Paroxysmal atrial fibrillation: Secondary | ICD-10-CM | POA: Diagnosis not present

## 2024-01-09 DIAGNOSIS — Z7901 Long term (current) use of anticoagulants: Secondary | ICD-10-CM | POA: Insufficient documentation

## 2024-01-09 DIAGNOSIS — G4733 Obstructive sleep apnea (adult) (pediatric): Secondary | ICD-10-CM | POA: Insufficient documentation

## 2024-01-09 DIAGNOSIS — Z79899 Other long term (current) drug therapy: Secondary | ICD-10-CM | POA: Diagnosis not present

## 2024-01-09 DIAGNOSIS — I5022 Chronic systolic (congestive) heart failure: Secondary | ICD-10-CM

## 2024-01-09 DIAGNOSIS — Z87891 Personal history of nicotine dependence: Secondary | ICD-10-CM | POA: Diagnosis not present

## 2024-01-09 LAB — CBC
HCT: 33.8 % — ABNORMAL LOW (ref 39.0–52.0)
Hemoglobin: 11.2 g/dL — ABNORMAL LOW (ref 13.0–17.0)
MCH: 32.7 pg (ref 26.0–34.0)
MCHC: 33.1 g/dL (ref 30.0–36.0)
MCV: 98.8 fL (ref 80.0–100.0)
Platelets: 169 10*3/uL (ref 150–400)
RBC: 3.42 MIL/uL — ABNORMAL LOW (ref 4.22–5.81)
RDW: 13.9 % (ref 11.5–15.5)
WBC: 4.3 10*3/uL (ref 4.0–10.5)
nRBC: 0 % (ref 0.0–0.2)

## 2024-01-09 LAB — BASIC METABOLIC PANEL WITH GFR
Anion gap: 10 (ref 5–15)
BUN: 21 mg/dL (ref 8–23)
CO2: 29 mmol/L (ref 22–32)
Calcium: 9.3 mg/dL (ref 8.9–10.3)
Chloride: 100 mmol/L (ref 98–111)
Creatinine, Ser: 1.55 mg/dL — ABNORMAL HIGH (ref 0.61–1.24)
GFR, Estimated: 51 mL/min — ABNORMAL LOW (ref >60)
Glucose, Bld: 90 mg/dL (ref 70–99)
Potassium: 4.6 mmol/L (ref 3.5–5.1)
Sodium: 139 mmol/L (ref 135–145)

## 2024-01-09 NOTE — Patient Instructions (Signed)
 Medication Instructions:  Your physician recommends that you continue on your current medications as directed. Please refer to the Current Medication list given to you today.  *If you need a refill on your cardiac medications before your next appointment, please call your pharmacy*  Lab Work: None ordered.  If you have labs (blood work) drawn today and your tests are completely normal, you will receive your results only by: MyChart Message (if you have MyChart) OR A paper copy in the mail If you have any lab test that is abnormal or we need to change your treatment, we will call you to review the results.  Testing/Procedures: None ordered.   Follow-Up: At [facility], you and your health needs are our priority.  As part of our continuing mission to provide you with exceptional heart care, our providers are all part of one team.  This team includes your primary Cardiologist (physician) and Advanced Practice Providers or APPs (Physician Assistants and Nurse Practitioners) who all work together to provide you with the care you need, when you need it.  Your next appointment:   4 weeks with [name] Adolphus, PA-C      1st Floor: - Lobby - Registration  - Pharmacy  - Lab - Cafe  2nd Floor: - PV Lab - Diagnostic Testing (echo, CT, nuclear med)  3rd Floor: - Vacant  4th Floor: - TCTS (cardiothoracic surgery) - AFib Clinic - Structural Heart Clinic - Vascular Surgery  - Vascular Ultrasound  5th Floor: - HeartCare Cardiology (general and EP) - Clinical Pharmacy for coumadin , hypertension, lipid, weight-loss medications, and med management appointments    Valet parking services will be available as well.

## 2024-01-09 NOTE — Progress Notes (Signed)
 So     Patient Care Team: Senaida Dama, NP as PCP - General (Nurse Practitioner) Mardell Shade, MD as PCP - Advanced Heart Failure (Cardiology) Verona Goodwill, MD as PCP - Electrophysiology (Cardiology) Darlis Eisenmenger, MD as PCP - Cardiology (Cardiology) Candiss Chamorro, MD (Family Medicine) Denton Flakes, LCSW as Social Worker (Licensed Clinical Social Worker)   HPI  [name] [name] is a 63 y.o. 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    LV programed subtherapeutic with a QRS duration of 140 ms.  Because of RBBB AND PROGRAMed FUSION PATIENT>>  Dyspnea is better since the reprogramming. GEN change 6/23  Atrial fibrillation and polymorphic ventricular tachycardia Rx w amiodarone  therapy.     Recurrent ventricular tachycardia 9/18 potassium levels were normal at that time.   Amiodarone  was continued and sotalol  was initiated.  Intercurrent ventricular tachycardia treated with ATP ; 9/24 hospitalization for recurrent shock Rx VT; Metoprolol  uptitrated  Interval shock for ATP failure as well as an interval episode of 3 minutes of ventricular tachycardia  Tolerating amiodarone  and mexiletine pretty well at this point no intercurrent treated VT for about 2-1/2 weeks.  Underwent ablation with PHr but difficult procedure 2/2 noninducibility  Had recurrent VT afterwards  Still w fatigue and I will now but he did not 80s anything needs an answer   Date Cr K Mg TSH LFTs Hgb PFTs  9/18  1.02 3.8 2.3         11/18     0.860 36 11.0    12/22 1.18 3.9 2.3 (5/22) 1.085 16 11.8   12/23 1.25<<2.6 4.1    11.2   9/24} 1.27<<1.6 3.5 2.1 1.137 29 11.0   4/25 1.55 4.6 2.2   11.2     DATE TEST EF   12/21 Echo 20-25 % LV global hypokinesis and hypertrophy, G1 diastolic dysfunction  12/22 Echo 20-25%   4/23 PYP  Equivocal    9/24  L/RHC  No obstruc CAD Filling press normal CO preserved   9/24 Echo   20-25%       Past Medical History:  Diagnosis Date   AICD (automatic cardioverter/defibrillator) present    Arthritis    Atrial fibrillation (HCC)    CAD (coronary artery disease)    Mild nonobstructive (Cath 09)   Chronic systolic heart failure (HCC)    Nonischemic CM: echo 4/12 with EF 25-30%, grade 2 diast dysfxn, mild dilated aortic root 43 mm, trivial MR, mod LAE   COPD (chronic obstructive pulmonary disease) (HCC)    Dyspnea    with exertion    Ejection fraction < 50% 08/2019   20-25% noted on ECHO   H/O ETOH abuse    HTN (hypertension)    Hypersomnia    Nonischemic cardiomyopathy (HCC)    Obesity, morbid (HCC)    OSA on CPAP    Systolic and diastolic CHF, acute on chronic (HCC) 11/2016   Tobacco use disorder    Ventricular tachycardia (HCC)     Past Surgical History:  Procedure Laterality Date   CARDIAC CATHETERIZATION N/A 04/08/2015   Procedure: Right/Left Heart Cath and Coronary Angiography;  Surgeon: Lucendia Rusk, MD;  Location: [facility] INVASIVE CV LAB;  Service: Cardiovascular;  Laterality: N/A;   EP IMPLANTABLE DEVICE N/A 04/14/2015   Procedure: BiV ICD Insertion CRT-D;  Surgeon: Verona Goodwill, MD;  Location: [facility] INVASIVE CV LAB;  Service: Cardiovascular;  Laterality: N/A;   HYDROCELE EXCISION / REPAIR     ICD GENERATOR CHANGEOUT N/A 02/20/2022   Procedure: ICD GENERATOR CHANGEOUT;  Surgeon: Verona Goodwill, MD;  Location: [facility] INVASIVE CV LAB;  Service: Cardiovascular;  Laterality: N/A;   ICD IMPLANT     RIGHT/LEFT HEART CATH AND CORONARY ANGIOGRAPHY N/A 06/07/2023   Procedure: RIGHT/LEFT HEART CATH AND CORONARY ANGIOGRAPHY;  Surgeon: Darlis Eisenmenger, MD;  Location: [facility] INVASIVE CV LAB;  Service: Cardiovascular;  Laterality: N/A;   TOTAL HIP ARTHROPLASTY Right 11/04/2018   Procedure: TOTAL HIP ARTHROPLASTY ANTERIOR APPROACH;  Surgeon: Neil Balls, MD;  Location: [facility] ORS;  Service:  Orthopedics;  Laterality: Right;   TOTAL KNEE ARTHROPLASTY Right 01/30/2020   Procedure: TOTAL KNEE ARTHROPLASTY;  Surgeon: Neil Balls, MD;  Location: [facility] ORS;  Service: Orthopedics;  Laterality: Right;   TOTAL KNEE ARTHROPLASTY Left 07/30/2020   Procedure: TOTAL KNEE ARTHROPLASTY;  Surgeon: Neil Balls, MD;  Location: [facility] OR[facility];  Service: Orthopedics;  Laterality: Left;   TUMOR EXCISION Left    TUMOR EXCISION Left 1982   Benign 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 18 g 0   amiodarone  (PACERONE ) 200 MG tablet Take 1 tablet (200 mg total) by mouth 2 (two) times daily. 180 tablet 2   Ascorbic Acid (VITAMIN C) 100 MG tablet Take 100 mg by mouth daily.     atorvastatin  (LIPITOR ) 80 MG tablet TAKE 1 TABLET EVERY EVENING 90 tablet 3   colchicine 0.6 MG tablet Take 0.6 mg by mouth daily.     ibuprofen (ADVIL) 200 MG tablet Take 400 mg by mouth every 8 (eight) hours as needed for moderate pain.     metoprolol  succinate (TOPROL -XL) 25 MG 24 hr tablet Take 1 tablet (25 mg total) by mouth daily. 30 tablet 5   Multiple Vitamins-Minerals (MULTIVITAMIN WITH MINERALS) tablet Take 1 tablet by mouth daily.     OVER THE COUNTER MEDICATION Take 1 tablet by mouth daily. Healthy eye care     potassium chloride  SA (KLOR-CON  M) 20 MEQ tablet Take 1 tablet (20 mEq total) by mouth daily. 90 tablet 3   sacubitril -valsartan  (ENTRESTO ) 49-51 MG Take 1 tablet by mouth 2 (two) times daily. 180 tablet 3   sotalol  (BETAPACE ) 80 MG tablet Take 1 tablet (80 mg total) by mouth 2 (two) times daily. 60 tablet 3   spironolactone  (ALDACTONE ) 25 MG tablet TAKE 1 TABLET EVERY DAY 90 tablet 3   torsemide  (DEMADEX ) 20 MG tablet TAKE 3 TABLETS EVERY MORNING AND TAKE 2 TABLETS EVERY EVENING 450 tablet 3   XARELTO  20 MG  TABS tablet TAKE 1 TABLET DAILY WITH SUPPER. 90 tablet 3   No current facility-administered medications for this visit.    Allergies  Allergen Reactions   Sglt2 Inhibitors Other (See Comments)    Recurrent UTI      Review of Systems negative except from HPI and PMH  Physical Exam BP 110/78   Pulse 93   Ht 6\' 5"  (1.956 m)   Wt (!) 329 lb (149.2 kg)   SpO2 94%   BMI 39.01 kg/m  Well developed and well nourished in no acute distress HENT normal Neck supple with JVP-flat Clear  Device pocket well healed; without hematoma or erythema.  There is no tethering  Regular rate and rhythm, no  gallop No  murmur Abd-soft with active BS No Clubbing cyanosis  edema Skin-warm and dry A & Oriented  Grossly normal sensory and motor function  ECG AV pacing at 350.  The AV delay was shortened.  Multiple different pacing configurations were tested.  His underlying rhythm is now a right bundle branch block and a left bundle branch block and so he is ultimately programmed with an AV delay of 200 ms with an RV offset of 40 ms with a paced QRS duration going from about 190 to about 150 ms  Device function is normal. Programming changes (As above)  See Paceart for details        12/18 QRS Rs V1 and negative lead I QRSd approximately 200 ms  1/20 QRS upright lead I and negative lead V1 QRSd 158 ms 12/20 QRS negative lead I and upright V1 QRSd 218 ms 12/21 QRS negative lead V1 upright lead I QRSd 142 ms 8/22 QRS negative lead V1 upright lead I QRSd 140 ms  Assessment and  Plan  Nonischemic cardiomyopathy  Right bundle branch block  Congestive heart failure-chronic-systolic  Ventricular tachycardia-polymorphic  Implantable defibrillator-CRT-[employer]  PVCs  Atrial fibrillation-paroxysmal  Amiodarone  therapy for the above  Sinus node dysfunction with chronotropic incompetence  SGLT2 intolerance  Recurrent ventricular tachycardia.  On amiodarone  and sotalol , mexiletine have not  been discontinued  I have talked to Dr.Hranitsky following recurrent VT following the ablation.  2 thoguhts that I have is-- 1 trying quinidine as a sodium channel blocker to potentially further slow conduction and terminate the VT that way as opposed to trying to prolong repolarization and the other is the consideration of advanced therapies and/or transplant.  To this end we have referred him back to the heart failure clinic and we may want to get him connected with Dr. Aline Apo at [facility] who could help further think about VT therapies and the role of transplant if we are unable to control VT in this relatively young man       .

## 2024-01-09 NOTE — Progress Notes (Signed)
 ADVANCED HF CLINIC NOTE   Primary Care: Dr. Devere Flurry, MD HF Cardiology: Dr. Mitzie Anda EP: Dr  Helmut Lobe  HPI: [name] [name] is a 63 y.o. male with a past medical history of chronic systolic CHF  (EF 15% in April 2018), dilated NICM s/p BiV ICD, PAF with intermittent compliance with Xarelto , ETOH abuse, tobacco abuse, OSA on CPAP, and HTN.   Admitted 12/25/16-12/27/16 with acute on chronic systolic CHF, volume overload. He was diuresed 11 pounds on IV lasix .  Discharge weight was 294 pounds. It was felt that the etiology of his NICM was ETOH vs hypertension related.   Admitted 5/14-5/16/18 with volume overload, acute respiratory failure requiring Bipap. He was not taking his Entresto  as he was confused about dosing and there was a mix up his pharmacy. He was diuresed with IV lasix , discharge weight was 295 pounds.   Admitted 05/30/17-06/02/17 with VT, ICD shock x 4. He is now on amiodarone  and sotalol .  Echo in 12/20 showed EF 20-25%, severe LV dilation, moderate LVH, normal RV, small IVC.   He was admitted in 3/21 with ICD shock in setting of hypokalemia and heavy ETOH. Entresto  was decreased with low BP.  He was diuresed due to volume overload.   He had left TKR in 11/21 with no complication.   Echo in 12/21 showed EF 20-25%, moderate LVH, normal RV, normal IVC.   Echo in 12/22 showed EF 20-25%, diffuse hypokinesis, mildly decreased RV systolic function.   Admitted 9/24 with VT. Was shocked by his ICD 9/13 and again 9/18. He was brought in for [facility] which showed well-compensated CHF and mild nonobstructive CAD. Suspected VT was 2/2 acute stressors (mom passed away and his brother had recently been hospitalized). Started toprol  XL. No further VT.   Admitted 12/28/23 for scheduled VT ablation at [facility].   Today he returns for HF follow up. Overall feeling fine but get a little tired. He doesn't feel as short of breath. Denies PND/Orthopnea. Denies syncope/presyncope. Appetite ok. No fever or  chills. Weight at home stable. Taking all medications. Smoking marijuana every other day.   ECG (personally reviewed): v-paced, 82 bpm   Medtronic device interrogation- Activity ~ 2 hours per day. Thoracic impedance low.  Labs (9/18): K 3.8, creatinine 1.02 Labs (11/18): K 4.4, creatinine 1.19, TSH normal, LFTs normal Labs (11/05/18): K 4 Creatinine 1.05  Labs (12/20): K 4, creatinine 1.19 Labs (3/21): K 4.1, creatinine 1.05, hgb 10.8, LFTs normal, TSH normal Labs (11/21): K 4.7, creatinine 1.24, hgb 11.9 Labs (12/21): K 4, creatinine 1.05, TSH and LFTs normal Labs (5/22): K 4.8, creatinine 1.67 Labs (5/23): K 4.1, creatinine 1.32 Labs (10/23): K 4.9, creatinine 1.64 Labs (9/24): K 3.5, SCr 1.27   PMH: 1. Chronic systolic CHF: Medtronic CRT-D.  Nonischemic cardiomyopathy. Possibly ETOH-related.  - LHC (2016): No CAD.  - Echo (2016): EF 20-25%, diffuse hypokinesis.  - Echo (4/18): EF 15%, normal RV size and systolic function.  - Echo (11/18): EF 20%, mildly dilated LV with mild LVH, normal RV size with mildly decreased RV systolic function.  - Echo (12/20): EF 20-25%, severe LV dilation, moderate LVH, normal RV, small IVC.  - Echo (12/21): EF 20-25%, moderate LVH, normal RV, normal IVC. - Echo (12/22): EF 20-25%, diffuse hypokinesis, mildly decreased RV systolic function.  - PYP scan (4/23): grade 1, H/CL < 1.5 - LHC/RHC (9/24) showed well-compensated CHF and mild nonobstructive CAD 2. Atrial fibrillation: Paroxysmal.   3. OSA: On CPAP.  4. ETOH abuse:  Prior heavy ETOH.  5. H/o VT 6. COPD: Prior smoker.  7. COVID-19 infection 7/22 8. 12/2023 VT ablation at [facility]  ROS: All systems reviewed and negative except as per HPI.   Current Outpatient Medications  Medication Sig Dispense Refill   albuterol  (VENTOLIN  HFA) 108 (90 Base) MCG/ACT inhaler Inhale 1 puff into the lungs every 6 (six) hours as needed for wheezing or shortness of breath. 18 g 0   amiodarone  (PACERONE ) 200 MG tablet  Take 1 tablet (200 mg total) by mouth 2 (two) times daily. 180 tablet 2   Ascorbic Acid (VITAMIN C) 100 MG tablet Take 100 mg by mouth daily.     atorvastatin  (LIPITOR ) 80 MG tablet TAKE 1 TABLET EVERY EVENING 90 tablet 3   colchicine 0.6 MG tablet Take 0.6 mg by mouth daily.     ibuprofen (ADVIL) 200 MG tablet Take 400 mg by mouth every 8 (eight) hours as needed for moderate pain.     metoprolol  succinate (TOPROL -XL) 25 MG 24 hr tablet Take 1 tablet (25 mg total) by mouth daily. 30 tablet 5   Multiple Vitamins-Minerals (MULTIVITAMIN WITH MINERALS) tablet Take 1 tablet by mouth daily.     OVER THE COUNTER MEDICATION Take 1 tablet by mouth daily. Healthy eye care     potassium chloride  SA (KLOR-CON  M) 20 MEQ tablet Take 1 tablet (20 mEq total) by mouth daily. 90 tablet 3   sacubitril -valsartan  (ENTRESTO ) 49-51 MG Take 1 tablet by mouth 2 (two) times daily. 180 tablet 3   sotalol  (BETAPACE ) 80 MG tablet Take 1 tablet (80 mg total) by mouth 2 (two) times daily. 60 tablet 3   spironolactone  (ALDACTONE ) 25 MG tablet TAKE 1 TABLET EVERY DAY 90 tablet 3   torsemide  (DEMADEX ) 20 MG tablet TAKE 3 TABLETS EVERY MORNING AND TAKE 2 TABLETS EVERY EVENING 450 tablet 3   XARELTO  20 MG TABS tablet TAKE 1 TABLET DAILY WITH SUPPER. 90 tablet 3   No current facility-administered medications for this encounter.   Allergies  Allergen Reactions   Sglt2 Inhibitors Other (See Comments)    Recurrent UTI    Social History   Socioeconomic History   Marital status: Single    Spouse name: Not on file   Number of children: 2   Years of education: Not on file   Highest education level: Not on file  Occupational History   Occupation: scrap metal   Occupation: disabled, odd jobs  Tobacco Use   Smoking status: Former    Current packs/day: 0.00    Average packs/day: 0.5 packs/day for 4.0 years (2.0 ttl pk-yrs)    Types: Cigarettes    Start date: 10/29/2001    Quit date: 10/29/2005    Years since quitting: 18.2    Smokeless tobacco: Never   Tobacco comments:    about a pack a week. social  Vaping Use   Vaping status: Never Used  Substance and Sexual Activity   Alcohol use: Yes    Comment: 12 beers per week    Drug use: Yes    Types: Marijuana   Sexual activity: Yes    Partners: Female  Other Topics Concern   Not on file  Social History Narrative   ** Merged History Encounter **       Social Drivers of Health   Financial Resource Strain: Low Risk  (10/04/2023)   Overall Financial Resource Strain (CARDIA)    Difficulty of Paying Living Expenses: Not hard at all  Food Insecurity: No  Food Insecurity (10/04/2023)   Hunger Vital Sign    Worried About Running Out of Food in the Last Year: Never true    Ran Out of Food in the Last Year: Never true  Transportation Needs: No Transportation Needs (10/04/2023)   PRAPARE - Administrator, Civil Service (Medical): No    Lack of Transportation (Non-Medical): No  Physical Activity: Insufficiently Active (10/04/2023)   Exercise Vital Sign    Days of Exercise per Week: 3 days    Minutes of Exercise per Session: 30 min  Stress: No Stress Concern Present (10/04/2023)   [employer] of Occupational Health - Occupational Stress Questionnaire    Feeling of Stress : Not at all  Social Connections: Socially Isolated (10/04/2023)   Social Connection and Isolation Panel [NHANES]    Frequency of Communication with Friends and Family: Three times a week    Frequency of Social Gatherings with Friends and Family: Twice a week    Attends Religious Services: Never    Database administrator or Organizations: No    Attends Banker Meetings: Never    Marital Status: Divorced  Catering manager Violence: Not At Risk (10/04/2023)   Humiliation, Afraid, Rape, and Kick questionnaire    Fear of Current or Ex-Partner: No    Emotionally Abused: No    Physically Abused: No    Sexually Abused: No   Family History  Problem Relation Age of Onset    Other Mother        cardiac surgery. late 1990s   Heart Problems Mother        CABG AGE 72   Congestive Heart Failure Father    Healthy Father        AGE 2   Coronary artery disease Other    Healthy Brother        AGE 66   Healthy Sister        AGE 17   Healthy Sister        AGE 83   Healthy Son    Healthy Son    Healthy Daughter    BP (!) 90/50   Pulse 77   Wt (!) 149.7 kg (330 lb)   SpO2 95%   BMI 39.13 kg/m   Wt Readings from Last 3 Encounters:  01/09/24 (!) 149.7 kg (330 lb)  09/28/23 (!) 150.4 kg (331 lb 9.6 oz)  07/17/23 (!) 150.7 kg (332 lb 3.7 oz)    PHYSICAL EXAM: General:   No resp difficulty. Walked in the clinic.  Neck: supple. no JVD.  Cor: PMI nondisplaced. Regular rate & rhythm. No rubs, gallops or murmurs. Lungs: clear Abdomen: soft, nontender, nondistended.  Extremities: no cyanosis, clubbing, rash, edema Neuro: alert & oriented x3  ASSESSMENT & PLAN: 1. Chronic systolic CHF: NICM, Echo 12/2016 EF 15%. Normal coronaries on cath in July 2016. Medtronic CRT-D device. ETOH vs HTN vs myocarditis as cause of cardiomyopathy. Echo 12/22 showed that EF remains 20-25%.  PYP scan not suggestive of TTR cardiac amyloidosis.   NYHA IIIC. Appears euvolemic. Reviewed Optivol.   -Continue torsemide  60 mg daily alternating with 40 mg every other day. - Continue Entresto  49/51 mg bid. - Continue spironolactone  25 mg daily.  - Intolerant to Coreg  in the past with hypotension, Continue Toprol  XL, Sotalol  and amiodarone .  - He stopped Jardiance  after 3 UTIs, would not start back.  - Rarely drinking alcohol.   - BMET/BNP today Briefly discussed advanced therapies. May need to consider  down the road. 2. Paroxysmal atrial fibrillation: Maintaining SR .  - Continue amiodarone  200 mg daily.  Normal LFTs/TSH 9/24.  Should have regular eye exam.  - Continue Xarelto  20 mg daily.  No bleeding issues.  3. ETOH abuse: Suspect this contributes to cardiomyopathy.   Sounds like  he has cut back.  4. Tobacco abuse: No longer smoking but does smoke marijuana on some days 5. VT: Followed closely by EP, EP increased baseline pacing rate to 80.  S/P VT ablation.  He is now on amiodarone , sotalol , and toprol  XL 25 mg daily.  6. OSA: Use CPAP.  7. Depression/Anxiety: lots of life stressors + mother recently passed away.  - Now off Zoloft  d/t prolonged erections   He has follow up with EP later today.   Follow up with Dr Mitzie Anda in 4 weeks.   [name] Bars, NP 01/09/24

## 2024-01-09 NOTE — Patient Instructions (Signed)
 There has been no changes to your medications.  Labs done today, your results will be available in MyChart, we will contact you for abnormal readings.  Your physician recommends that you schedule a follow-up appointment at the [facility] office.  Located at [address] [facility] [address] [place], [place], [address]  . If you have any questions or concerns before your next appointment please send us  a message through [place] or call our office at [phone].    TO LEAVE A MESSAGE FOR THE NURSE SELECT OPTION 2, PLEASE LEAVE A MESSAGE INCLUDING: YOUR NAME DATE OF BIRTH CALL BACK NUMBER REASON FOR CALL**this is important as we prioritize the call backs  YOU WILL RECEIVE A CALL BACK THE SAME DAY AS LONG AS YOU CALL BEFORE 4:00 PM  At the [facility], you and your health needs are our priority. As part of our continuing mission to provide you with exceptional heart care, we have created designated Provider Care Teams. These Care Teams include your primary Cardiologist (physician) and Advanced Practice Providers (APPs- Physician Assistants and Nurse Practitioners) who all work together to provide you with the care you need, when you need it.   You may see any of the following providers on your designated Care Team at your next follow up: Dr Jules Oar Dr Peder Bourdon Dr. Alwin Baars Dr. Arta Lark Amy Marijane Shoulders, NP Ruddy Corral, [place] [facility], [place] Dennise Fitz, NP [place] Lee, NP Shawnee Dellen, NP Luster Salters, PharmD Bevely Brush, PharmD   Please be sure to bring in all your medications bottles to every appointment.    Thank you for choosing [facility]

## 2024-01-10 ENCOUNTER — Telehealth: Payer: Self-pay | Admitting: Internal Medicine

## 2024-01-10 DIAGNOSIS — G4733 Obstructive sleep apnea (adult) (pediatric): Secondary | ICD-10-CM

## 2024-01-10 NOTE — Telephone Encounter (Signed)
 Patient states that he is calling for the setting for his cpap. Please advise

## 2024-01-14 NOTE — Telephone Encounter (Signed)
 Follow Up:       Patient is calling back again about his C-Pap

## 2024-01-15 ENCOUNTER — Encounter: Payer: Self-pay | Admitting: *Deleted

## 2024-01-15 NOTE — Telephone Encounter (Addendum)
 Patient is having problems with his Bipap machine and states he has restarted his Bipap twice. He states [facility] needs his current settings. Updated settings have been entered in Epic and order placed to [employer] [facility].

## 2024-01-21 ENCOUNTER — Telehealth: Payer: Self-pay

## 2024-01-21 LAB — CUP PACEART INCLINIC DEVICE CHECK
Date Time Interrogation Session: 20250423173855
Implantable Lead Connection Status: 753985
Implantable Lead Connection Status: 753985
Implantable Lead Connection Status: 753985
Implantable Lead Implant Date: 20160727
Implantable Lead Implant Date: 20160727
Implantable Lead Implant Date: 20160727
Implantable Lead Location: 753858
Implantable Lead Location: 753859
Implantable Lead Location: 753860
Implantable Lead Model: 4598
Implantable Lead Model: 5076
Implantable Lead Model: 6935
Implantable Pulse Generator Implant Date: 20230605

## 2024-01-21 NOTE — Telephone Encounter (Signed)
 [name] [name]

## 2024-01-21 NOTE — Telephone Encounter (Signed)
 1. Looking at device interrogation, fair amount of ongoing NSVT and VT terminated by ATP despite amiodarone  + sotalol  and recent VT ablation by Dr. Birdia Buhl.  From my standpoint, I think we should add quinidine.  However, I would like EP input also, so in Dr. Doyle Generous absence, I have brought in Dr. Lawana Pray for assistance.   2. I think he needs RHC to assess filling pressures and cardiac output.  Pattie Borders, could you please arrange this for RHC this week or next week with me? Happy to see him in office if he wants to do that first but had recent office visit and would rather just directly schedule the RHC if he is agreeable.

## 2024-01-21 NOTE — Telephone Encounter (Signed)
 Called to request transmission to check on VT burden before reaching out to [facility] MD. Pt will send one.

## 2024-01-21 NOTE — Telephone Encounter (Signed)
 Spoke w/pt, he states he is feeling fine, denies sob/cp/dizzy/lightheaded Appt sch for tomorrow 5/6 at 1:30 with Dr Mitzie Anda

## 2024-01-22 ENCOUNTER — Ambulatory Visit (HOSPITAL_COMMUNITY)
Admission: RE | Admit: 2024-01-22 | Discharge: 2024-01-22 | Disposition: A | Discharge status: Home / Self Care | Source: Ambulatory Visit | Attending: Cardiology | Admitting: Cardiology

## 2024-01-22 ENCOUNTER — Encounter (HOSPITAL_COMMUNITY): Payer: Self-pay | Admitting: Cardiology

## 2024-01-22 VITALS — BP 110/62 | HR 84 | Ht 77.0 in | Wt 328.0 lb

## 2024-01-22 DIAGNOSIS — G4733 Obstructive sleep apnea (adult) (pediatric): Secondary | ICD-10-CM | POA: Diagnosis not present

## 2024-01-22 DIAGNOSIS — Z7901 Long term (current) use of anticoagulants: Secondary | ICD-10-CM | POA: Diagnosis not present

## 2024-01-22 DIAGNOSIS — F1010 Alcohol abuse, uncomplicated: Secondary | ICD-10-CM | POA: Diagnosis not present

## 2024-01-22 DIAGNOSIS — I472 Ventricular tachycardia, unspecified: Secondary | ICD-10-CM

## 2024-01-22 DIAGNOSIS — Z4502 Encounter for adjustment and management of automatic implantable cardiac defibrillator: Secondary | ICD-10-CM | POA: Diagnosis not present

## 2024-01-22 DIAGNOSIS — I480 Paroxysmal atrial fibrillation: Secondary | ICD-10-CM | POA: Diagnosis not present

## 2024-01-22 DIAGNOSIS — F1590 Other stimulant use, unspecified, uncomplicated: Secondary | ICD-10-CM | POA: Insufficient documentation

## 2024-01-22 DIAGNOSIS — I5022 Chronic systolic (congestive) heart failure: Secondary | ICD-10-CM

## 2024-01-22 DIAGNOSIS — I110 Hypertensive heart disease with heart failure: Secondary | ICD-10-CM | POA: Diagnosis not present

## 2024-01-22 DIAGNOSIS — I2510 Atherosclerotic heart disease of native coronary artery without angina pectoris: Secondary | ICD-10-CM | POA: Diagnosis not present

## 2024-01-22 DIAGNOSIS — I493 Ventricular premature depolarization: Secondary | ICD-10-CM

## 2024-01-22 DIAGNOSIS — I420 Dilated cardiomyopathy: Secondary | ICD-10-CM | POA: Insufficient documentation

## 2024-01-22 DIAGNOSIS — Z79899 Other long term (current) drug therapy: Secondary | ICD-10-CM | POA: Insufficient documentation

## 2024-01-22 DIAGNOSIS — Z87891 Personal history of nicotine dependence: Secondary | ICD-10-CM | POA: Insufficient documentation

## 2024-01-22 LAB — COMPREHENSIVE METABOLIC PANEL WITH GFR
ALT: 46 U/L — ABNORMAL HIGH (ref 0–44)
AST: 25 U/L (ref 15–41)
Albumin: 3.5 g/dL (ref 3.5–5.0)
Alkaline Phosphatase: 86 U/L (ref 38–126)
Anion gap: 10 (ref 5–15)
BUN: 26 mg/dL — ABNORMAL HIGH (ref 8–23)
CO2: 30 mmol/L (ref 22–32)
Calcium: 9.4 mg/dL (ref 8.9–10.3)
Chloride: 100 mmol/L (ref 98–111)
Creatinine, Ser: 1.88 mg/dL — ABNORMAL HIGH (ref 0.61–1.24)
GFR, Estimated: 40 mL/min — ABNORMAL LOW (ref >60)
Glucose, Bld: 97 mg/dL (ref 70–99)
Potassium: 4.4 mmol/L (ref 3.5–5.1)
Sodium: 140 mmol/L (ref 135–145)
Total Bilirubin: 0.9 mg/dL (ref 0.0–1.2)
Total Protein: 6.3 g/dL — ABNORMAL LOW (ref 6.5–8.1)

## 2024-01-22 LAB — MAGNESIUM: Magnesium: 2 mg/dL (ref 1.7–2.4)

## 2024-01-22 LAB — TSH: TSH: 1.32 u[IU]/mL (ref 0.350–4.500)

## 2024-01-22 LAB — BRAIN NATRIURETIC PEPTIDE: B Natriuretic Peptide: 380.1 pg/mL — ABNORMAL HIGH (ref 0.0–100.0)

## 2024-01-22 MED ORDER — METOPROLOL SUCCINATE ER 50 MG PO TB24: 50.0000 mg | ORAL_TABLET | Freq: Every day | ORAL | 6 refills | Status: DC

## 2024-01-22 NOTE — Progress Notes (Signed)
 ADVANCED HF CLINIC NOTE   Primary Care: Dr. Devere Flurry, MD HF Cardiology: Dr. Mitzie Anda EP: Rodolfo Clan  Chief complaint: VT  HPI: [name] is a 63 y.o. male with a past medical history of chronic systolic CHF  (EF 15% in April 2018), dilated NICM s/p BiV ICD, PAF with intermittent compliance with Xarelto , ETOH abuse, tobacco abuse, OSA on CPAP, and HTN.   Admitted 12/25/16-12/27/16 with acute on chronic systolic CHF, volume overload. He was diuresed 11 pounds on IV lasix .  Discharge weight was 294 pounds. It was felt that the etiology of his NICM was ETOH vs hypertension related.   Admitted 5/14-5/16/18 with volume overload, acute respiratory failure requiring Bipap. He was not taking his Entresto  as he was confused about dosing and there was a mix up his pharmacy. He was diuresed with IV lasix , discharge weight was 295 pounds.   Admitted 05/30/17-06/02/17 with VT, ICD shock x 4. He is now on amiodarone  and sotalol .  Echo in 12/20 showed EF 20-25%, severe LV dilation, moderate LVH, normal RV, small IVC.   He was admitted in 3/21 with ICD shock in setting of hypokalemia and heavy ETOH. Entresto  was decreased with low BP.  He was diuresed due to volume overload.   He had left TKR in 11/21 with no complication.   Echo in 12/21 showed EF 20-25%, moderate LVH, normal RV, normal IVC.   Echo in 12/22 showed EF 20-25%, diffuse hypokinesis, mildly decreased RV systolic function.   Admitted 9/24 with VT. Was shocked by his ICD 9/13 and again 9/18. He was brought in for [facility] which showed well-compensated CHF and mild nonobstructive CAD. Suspected VT was 2/2 acute stressors (mom passed away and his brother had recently been hospitalized). Started Toprol  XL.   VT ablation with Dr. Birdia Buhl in [place] in 4/25.  However, has continued to have VT after this.   Today he returns for HF follow up. He has continued to have episodes of NSVT and VT, 2 recent VT runs terminated by [facility].  He does not feel the VT.   He has not been shocked since 4/12. He is using Bipap nightly.  He is symptomatically doing reasonably well.  He is mildly short of breath after walking about 100 yards.  No problems with stairs.  No chest pain.  No lightheadedness.  Of note he still drinks 2-3 beers/day and drinks a pot of coffee every morning.    ECG (personally reviewed):A-V sequential pacing  Medtronic device interrogation: Stable thoracic impedance, 94% BiV pacing, no AF, Multiple runs NSVT and VT   Labs (9/18): K 3.8, creatinine 1.02 Labs (11/18): K 4.4, creatinine 1.19, TSH normal, LFTs normal Labs (11/05/18): K 4 Creatinine 1.05  Labs (12/20): K 4, creatinine 1.19 Labs (3/21): K 4.1, creatinine 1.05, hgb 10.8, LFTs normal, TSH normal Labs (11/21): K 4.7, creatinine 1.24, hgb 11.9 Labs (12/21): K 4, creatinine 1.05, TSH and LFTs normal Labs (5/22): K 4.8, creatinine 1.67 Labs (5/23): K 4.1, creatinine 1.32 Labs (10/23): K 4.9, creatinine 1.64 Labs (9/24): K 3.5, SCr 1.27 Labs (4/25): K 4.6, creatinine 1.55, hgb 11.2  PMH: 1. Chronic systolic CHF: Medtronic CRT-D.  Nonischemic cardiomyopathy. Possibly ETOH-related.  - LHC (2016): No CAD.  - Echo (2016): EF 20-25%, diffuse hypokinesis.  - Echo (4/18): EF 15%, normal RV size and systolic function.  - Echo (11/18): EF 20%, mildly dilated LV with mild LVH, normal RV size with mildly decreased RV systolic function.  - Echo (12/20): EF 20-25%,  severe LV dilation, moderate LVH, normal RV, small IVC.  - Echo (12/21): EF 20-25%, moderate LVH, normal RV, normal IVC. - Echo (12/22): EF 20-25%, diffuse hypokinesis, mildly decreased RV systolic function.  - PYP scan (4/23): grade 1, H/CL < 1.5 - LHC/RHC (9/24) showed well-compensated CHF and mild nonobstructive CAD - Echo (9/24): EF 20-25%, normal RV 2. Atrial fibrillation: Paroxysmal.   3. OSA: On CPAP.  4. ETOH abuse: Prior heavy ETOH.  5. H/o VT: VT ablation by Dr. Birdia Buhl at [facility] in 4/25. 6. COPD: Prior smoker.  7.  COVID-19 infection 7/22  ROS: All systems reviewed and negative except as per HPI.   Current Outpatient Medications  Medication Sig Dispense Refill   amiodarone  (PACERONE ) 200 MG tablet Take 1 tablet (200 mg total) by mouth 2 (two) times daily. 180 tablet 2   Ascorbic Acid (VITAMIN C) 100 MG tablet Take 100 mg by mouth daily.     atorvastatin  (LIPITOR ) 80 MG tablet TAKE 1 TABLET EVERY EVENING 90 tablet 3   colchicine 0.6 MG tablet Take 0.6 mg by mouth daily.     ibuprofen (ADVIL) 200 MG tablet Take 400 mg by mouth every 8 (eight) hours as needed for moderate pain.     Multiple Vitamins-Minerals (MULTIVITAMIN WITH MINERALS) tablet Take 1 tablet by mouth daily.     OVER THE COUNTER MEDICATION Take 1 tablet by mouth daily. Healthy eye care     potassium chloride  SA (KLOR-CON  M) 20 MEQ tablet Take 1 tablet (20 mEq total) by mouth daily. 90 tablet 3   sacubitril -valsartan  (ENTRESTO ) 49-51 MG Take 1 tablet by mouth 2 (two) times daily. 180 tablet 3   sotalol  (BETAPACE ) 80 MG tablet Take 1 tablet (80 mg total) by mouth 2 (two) times daily. 60 tablet 3   spironolactone  (ALDACTONE ) 25 MG tablet TAKE 1 TABLET EVERY DAY 90 tablet 3   torsemide  (DEMADEX ) 20 MG tablet TAKE 3 TABLETS EVERY MORNING AND TAKE 2 TABLETS EVERY EVENING 450 tablet 3   XARELTO  20 MG TABS tablet TAKE 1 TABLET DAILY WITH SUPPER. 90 tablet 3   albuterol  (VENTOLIN  HFA) 108 (90 Base) MCG/ACT inhaler Inhale 1 puff into the lungs every 6 (six) hours as needed for wheezing or shortness of breath. 18 g 0   metoprolol  succinate (TOPROL -XL) 50 MG 24 hr tablet Take 1 tablet (50 mg total) by mouth daily. 30 tablet 6   No current facility-administered medications for this encounter.   Allergies  Allergen Reactions   Sglt2 Inhibitors Other (See Comments)    Recurrent UTI    Social History   Socioeconomic History   Marital status: Single    Spouse name: Not on file   Number of children: 2   Years of education: Not on file   Highest  education level: Not on file  Occupational History   Occupation: scrap metal   Occupation: disabled, odd jobs  Tobacco Use   Smoking status: Former    Current packs/day: 0.00    Average packs/day: 0.5 packs/day for 4.0 years (2.0 ttl pk-yrs)    Types: Cigarettes    Start date: 10/29/2001    Quit date: 10/29/2005    Years since quitting: 18.2   Smokeless tobacco: Never   Tobacco comments:    about a pack a week. social  Vaping Use   Vaping status: Never Used  Substance and Sexual Activity   Alcohol use: Yes    Comment: 12 beers per week    Drug use: Yes  Types: Marijuana   Sexual activity: Yes    Partners: Female  Other Topics Concern   Not on file  Social History Narrative   ** Merged History Encounter **       Social Drivers of Health   Financial Resource Strain: Low Risk  (10/04/2023)   Overall Financial Resource Strain (CARDIA)    Difficulty of Paying Living Expenses: Not hard at all  Food Insecurity: No Food Insecurity (10/04/2023)   Hunger Vital Sign    Worried About Running Out of Food in the Last Year: Never true    Ran Out of Food in the Last Year: Never true  Transportation Needs: No Transportation Needs (10/04/2023)   PRAPARE - Administrator, Civil Service (Medical): No    Lack of Transportation (Non-Medical): No  Physical Activity: Insufficiently Active (10/04/2023)   Exercise Vital Sign    Days of Exercise per Week: 3 days    Minutes of Exercise per Session: 30 min  Stress: No Stress Concern Present (10/04/2023)   [employer] of Occupational Health - Occupational Stress Questionnaire    Feeling of Stress : Not at all  Social Connections: Socially Isolated (10/04/2023)   Social Connection and Isolation Panel [NHANES]    Frequency of Communication with Friends and Family: Three times a week    Frequency of Social Gatherings with Friends and Family: Twice a week    Attends Religious Services: Never    Database administrator or Organizations:  No    Attends Banker Meetings: Never    Marital Status: Divorced  Catering manager Violence: Not At Risk (10/04/2023)   Humiliation, Afraid, Rape, and Kick questionnaire    Fear of Current or Ex-Partner: No    Emotionally Abused: No    Physically Abused: No    Sexually Abused: No   Family History  Problem Relation Age of Onset   Other Mother        cardiac surgery. late 1990s   Heart Problems Mother        CABG AGE 79   Congestive Heart Failure Father    Healthy Father        AGE 59   Coronary artery disease Other    Healthy Brother        AGE 15   Healthy Sister        AGE 1   Healthy Sister        AGE 1   Healthy Son    Healthy Son    Healthy Daughter    BP 110/62   Pulse 84   Ht 6\' 5"  (1.956 m)   Wt (!) 148.8 kg (328 lb)   SpO2 95%   BMI 38.90 kg/m   Wt Readings from Last 3 Encounters:  01/22/24 (!) 148.8 kg (328 lb)  01/09/24 (!) 149.7 kg (330 lb)  01/09/24 (!) 149.2 kg (329 lb)    PHYSICAL EXAM: General: NAD Neck: No JVD, no thyromegaly or thyroid  nodule.  Lungs: Clear to auscultation bilaterally with normal respiratory effort. CV: Nondisplaced PMI.  Heart regular S1/S2, no S3/S4, no murmur.  No peripheral edema.  No carotid bruit.  Normal pedal pulses.  Abdomen: Soft, nontender, no hepatosplenomegaly, no distention.  Skin: Intact without lesions or rashes.  Neurologic: Alert and oriented x 3.  Psych: Normal affect. Extremities: No clubbing or cyanosis.  HEENT: Normal.   ASSESSMENT & PLAN: 1. Chronic systolic CHF: NICM, Echo 12/2016 EF 15%. Minimal CAD on cath in July 2016  and again in 9/24. Medtronic CRT-D device. ETOH vs HTN vs myocarditis as cause of cardiomyopathy. Echo 12/22 and again in 9/24 showed that EF remains 20-25%.  PYP scan not suggestive of TTR cardiac amyloidosis.  NYHA class II, he is not volume overloaded by exam or Optivol.  Main problem continues to be very frequent NSVT and VT.   - Continue torsemide  60 mg daily  alternating with 40 mg every other day. - Continue Entresto  49/51 mg bid. - Continue spironolactone  25 mg daily.  - Increase Toprol  XL to 50 mg daily.   - He stopped Jardiance  after 3 UTIs, would not start back.  - Would cut back further on ETOH, still drinks moderately.    - BMET/BNP today - With intractable ventricular arrhythmias, I discussed bringing him in for RHC to assess filling pressures and cardiac output.  If we are unable to get ventricular arrhythmias under control, he may be a candidate for a heart transplant.  I discussed risks/benefits and he agrees to RHC.  2. Paroxysmal atrial fibrillation: Maintaining SR .  - Continue amiodarone  200 mg daily.  Check LFTs and TSH today.  Should have regular eye exam.  - Continue Xarelto  20 mg daily.   3. ETOH abuse: Suspect this contributes to cardiomyopathy.  He has cut back but still drinks moderately, asked him to cut back further.   4. Tobacco abuse: No longer smoking but does smoke marijuana on some days 5. VT: Had VT ablation at [facility] in 4/25 but still with frequent NSVT and VT, most recently treated by [facility].  He has not had syncope.  - Continue amiodarone  200 mg bid, check LFTs and TSH today.  Needs regular eye exam.  - Continue sotalol .  - I am going to have him followup with EP for initiation of quinidine.   - I will increase Toprol  XL to 50 mg daily. - If initiation of quinidine is not helpful and ventricular arrhythmias continue/worsen, will need to consider heart transplant.   - We discussed cutting back on both caffeine and ETOH (drinks pot of coffee daily).  6. OSA: Use BiPAP.   Arrange EP followup for quinidine initiation.  Followup with me in 6 wks.   I spent 45 minutes reviewing records, interpreting echo, interviewing/examining patient, discussing with EP colleagues, and managing orders.    Peder Bourdon, MD 01/22/24

## 2024-01-22 NOTE — H&P (View-Only) (Signed)
 ADVANCED HF CLINIC NOTE   Primary Care: Dr. Devere Flurry, MD HF Cardiology: Dr. Mitzie Anda EP: [name] [name]  Chief complaint: VT  HPI: [name] [name] is a 63 y.o. male with a past medical history of chronic systolic CHF  (EF 15% in April 2018), dilated NICM s/p BiV ICD, PAF with intermittent compliance with Xarelto , ETOH abuse, tobacco abuse, OSA on CPAP, and HTN.   Admitted 12/25/16-12/27/16 with acute on chronic systolic CHF, volume overload. He was diuresed 11 pounds on IV lasix .  Discharge weight was 294 pounds. It was felt that the etiology of his NICM was ETOH vs hypertension related.   Admitted 5/14-5/16/18 with volume overload, acute respiratory failure requiring Bipap. He was not taking his Entresto  as he was confused about dosing and there was a mix up his pharmacy. He was diuresed with IV lasix , discharge weight was 295 pounds.   Admitted 05/30/17-06/02/17 with VT, ICD shock x 4. He is now on amiodarone  and sotalol .  Echo in 12/20 showed EF 20-25%, severe LV dilation, moderate LVH, normal RV, small IVC.   He was admitted in 3/21 with ICD shock in setting of hypokalemia and heavy ETOH. Entresto  was decreased with low BP.  He was diuresed due to volume overload.   He had left TKR in 11/21 with no complication.   Echo in 12/21 showed EF 20-25%, moderate LVH, normal RV, normal IVC.   Echo in 12/22 showed EF 20-25%, diffuse hypokinesis, mildly decreased RV systolic function.   Admitted 9/24 with VT. Was shocked by his ICD 9/13 and again 9/18. He was brought in for [facility] which showed well-compensated CHF and mild nonobstructive CAD. Suspected VT was 2/2 acute stressors (mom passed away and his brother had recently been hospitalized). Started Toprol  XL.   VT ablation with Dr. Birdia Buhl in [place] in 4/25.  However, has continued to have VT after this.   Today he returns for HF follow up. He has continued to have episodes of NSVT and VT, 2 recent VT runs terminated by [facility].  He does not feel the VT.   He has not been shocked since 4/12. He is using Bipap nightly.  He is symptomatically doing reasonably well.  He is mildly short of breath after walking about 100 yards.  No problems with stairs.  No chest pain.  No lightheadedness.  Of note he still drinks 2-3 beers/day and drinks a pot of coffee every morning.    ECG (personally reviewed):A-V sequential pacing  Medtronic device interrogation: Stable thoracic impedance, 94% BiV pacing, no AF, Multiple runs NSVT and VT   Labs (9/18): K 3.8, creatinine 1.02 Labs (11/18): K 4.4, creatinine 1.19, TSH normal, LFTs normal Labs (11/05/18): K 4 Creatinine 1.05  Labs (12/20): K 4, creatinine 1.19 Labs (3/21): K 4.1, creatinine 1.05, hgb 10.8, LFTs normal, TSH normal Labs (11/21): K 4.7, creatinine 1.24, hgb 11.9 Labs (12/21): K 4, creatinine 1.05, TSH and LFTs normal Labs (5/22): K 4.8, creatinine 1.67 Labs (5/23): K 4.1, creatinine 1.32 Labs (10/23): K 4.9, creatinine 1.64 Labs (9/24): K 3.5, SCr 1.27 Labs (4/25): K 4.6, creatinine 1.55, hgb 11.2  PMH: 1. Chronic systolic CHF: Medtronic CRT-D.  Nonischemic cardiomyopathy. Possibly ETOH-related.  - LHC (2016): No CAD.  - Echo (2016): EF 20-25%, diffuse hypokinesis.  - Echo (4/18): EF 15%, normal RV size and systolic function.  - Echo (11/18): EF 20%, mildly dilated LV with mild LVH, normal RV size with mildly decreased RV systolic function.  - Echo (12/20): EF 20-25%,  severe LV dilation, moderate LVH, normal RV, small IVC.  - Echo (12/21): EF 20-25%, moderate LVH, normal RV, normal IVC. - Echo (12/22): EF 20-25%, diffuse hypokinesis, mildly decreased RV systolic function.  - PYP scan (4/23): grade 1, H/CL < 1.5 - LHC/RHC (9/24) showed well-compensated CHF and mild nonobstructive CAD - Echo (9/24): EF 20-25%, normal RV 2. Atrial fibrillation: Paroxysmal.   3. OSA: On CPAP.  4. ETOH abuse: Prior heavy ETOH.  5. H/o VT: VT ablation by Dr. Birdia Buhl at [facility] in 4/25. 6. COPD: Prior smoker.  7.  COVID-19 infection 7/22  ROS: All systems reviewed and negative except as per HPI.   Current Outpatient Medications  Medication Sig Dispense Refill   amiodarone  (PACERONE ) 200 MG tablet Take 1 tablet (200 mg total) by mouth 2 (two) times daily. 180 tablet 2   Ascorbic Acid (VITAMIN C) 100 MG tablet Take 100 mg by mouth daily.     atorvastatin  (LIPITOR ) 80 MG tablet TAKE 1 TABLET EVERY EVENING 90 tablet 3   colchicine 0.6 MG tablet Take 0.6 mg by mouth daily.     ibuprofen (ADVIL) 200 MG tablet Take 400 mg by mouth every 8 (eight) hours as needed for moderate pain.     Multiple Vitamins-Minerals (MULTIVITAMIN WITH MINERALS) tablet Take 1 tablet by mouth daily.     OVER THE COUNTER MEDICATION Take 1 tablet by mouth daily. Healthy eye care     potassium chloride  SA (KLOR-CON  M) 20 MEQ tablet Take 1 tablet (20 mEq total) by mouth daily. 90 tablet 3   sacubitril -valsartan  (ENTRESTO ) 49-51 MG Take 1 tablet by mouth 2 (two) times daily. 180 tablet 3   sotalol  (BETAPACE ) 80 MG tablet Take 1 tablet (80 mg total) by mouth 2 (two) times daily. 60 tablet 3   spironolactone  (ALDACTONE ) 25 MG tablet TAKE 1 TABLET EVERY DAY 90 tablet 3   torsemide  (DEMADEX ) 20 MG tablet TAKE 3 TABLETS EVERY MORNING AND TAKE 2 TABLETS EVERY EVENING 450 tablet 3   XARELTO  20 MG TABS tablet TAKE 1 TABLET DAILY WITH SUPPER. 90 tablet 3   albuterol  (VENTOLIN  HFA) 108 (90 Base) MCG/ACT inhaler Inhale 1 puff into the lungs every 6 (six) hours as needed for wheezing or shortness of breath. 18 g 0   metoprolol  succinate (TOPROL -XL) 50 MG 24 hr tablet Take 1 tablet (50 mg total) by mouth daily. 30 tablet 6   No current facility-administered medications for this encounter.   Allergies  Allergen Reactions   Sglt2 Inhibitors Other (See Comments)    Recurrent UTI    Social History   Socioeconomic History   Marital status: Single    Spouse name: Not on file   Number of children: 2   Years of education: Not on file   Highest  education level: Not on file  Occupational History   Occupation: scrap metal   Occupation: disabled, odd jobs  Tobacco Use   Smoking status: Former    Current packs/day: 0.00    Average packs/day: 0.5 packs/day for 4.0 years (2.0 ttl pk-yrs)    Types: Cigarettes    Start date: 10/29/2001    Quit date: 10/29/2005    Years since quitting: 18.2   Smokeless tobacco: Never   Tobacco comments:    about a pack a week. social  Vaping Use   Vaping status: Never Used  Substance and Sexual Activity   Alcohol use: Yes    Comment: 12 beers per week    Drug use: Yes  Types: Marijuana   Sexual activity: Yes    Partners: Female  Other Topics Concern   Not on file  Social History Narrative   ** Merged History Encounter **       Social Drivers of Health   Financial Resource Strain: Low Risk  (10/04/2023)   Overall Financial Resource Strain (CARDIA)    Difficulty of Paying Living Expenses: Not hard at all  Food Insecurity: No Food Insecurity (10/04/2023)   Hunger Vital Sign    Worried About Running Out of Food in the Last Year: Never true    Ran Out of Food in the Last Year: Never true  Transportation Needs: No Transportation Needs (10/04/2023)   PRAPARE - Administrator, Civil Service (Medical): No    Lack of Transportation (Non-Medical): No  Physical Activity: Insufficiently Active (10/04/2023)   Exercise Vital Sign    Days of Exercise per Week: 3 days    Minutes of Exercise per Session: 30 min  Stress: No Stress Concern Present (10/04/2023)   [employer] of Occupational Health - Occupational Stress Questionnaire    Feeling of Stress : Not at all  Social Connections: Socially Isolated (10/04/2023)   Social Connection and Isolation Panel [NHANES]    Frequency of Communication with Friends and Family: Three times a week    Frequency of Social Gatherings with Friends and Family: Twice a week    Attends Religious Services: Never    Database administrator or Organizations:  No    Attends Banker Meetings: Never    Marital Status: Divorced  Catering manager Violence: Not At Risk (10/04/2023)   Humiliation, Afraid, Rape, and Kick questionnaire    Fear of Current or Ex-Partner: No    Emotionally Abused: No    Physically Abused: No    Sexually Abused: No   Family History  Problem Relation Age of Onset   Other Mother        cardiac surgery. late 1990s   Heart Problems Mother        CABG AGE 79   Congestive Heart Failure Father    Healthy Father        AGE 59   Coronary artery disease Other    Healthy Brother        AGE 15   Healthy Sister        AGE 1   Healthy Sister        AGE 1   Healthy Son    Healthy Son    Healthy Daughter    BP 110/62   Pulse 84   Ht 6\' 5"  (1.956 m)   Wt (!) 148.8 kg (328 lb)   SpO2 95%   BMI 38.90 kg/m   Wt Readings from Last 3 Encounters:  01/22/24 (!) 148.8 kg (328 lb)  01/09/24 (!) 149.7 kg (330 lb)  01/09/24 (!) 149.2 kg (329 lb)    PHYSICAL EXAM: General: NAD Neck: No JVD, no thyromegaly or thyroid  nodule.  Lungs: Clear to auscultation bilaterally with normal respiratory effort. CV: Nondisplaced PMI.  Heart regular S1/S2, no S3/S4, no murmur.  No peripheral edema.  No carotid bruit.  Normal pedal pulses.  Abdomen: Soft, nontender, no hepatosplenomegaly, no distention.  Skin: Intact without lesions or rashes.  Neurologic: Alert and oriented x 3.  Psych: Normal affect. Extremities: No clubbing or cyanosis.  HEENT: Normal.   ASSESSMENT & PLAN: 1. Chronic systolic CHF: NICM, Echo 12/2016 EF 15%. Minimal CAD on cath in July 2016  and again in 9/24. Medtronic CRT-D device. ETOH vs HTN vs myocarditis as cause of cardiomyopathy. Echo 12/22 and again in 9/24 showed that EF remains 20-25%.  PYP scan not suggestive of TTR cardiac amyloidosis.  NYHA class II, he is not volume overloaded by exam or Optivol.  Main problem continues to be very frequent NSVT and VT.   - Continue torsemide  60 mg daily  alternating with 40 mg every other day. - Continue Entresto  49/51 mg bid. - Continue spironolactone  25 mg daily.  - Increase Toprol  XL to 50 mg daily.   - He stopped Jardiance  after 3 UTIs, would not start back.  - Would cut back further on ETOH, still drinks moderately.    - BMET/BNP today - With intractable ventricular arrhythmias, I discussed bringing him in for RHC to assess filling pressures and cardiac output.  If we are unable to get ventricular arrhythmias under control, he may be a candidate for a heart transplant.  I discussed risks/benefits and he agrees to RHC.  2. Paroxysmal atrial fibrillation: Maintaining SR .  - Continue amiodarone  200 mg daily.  Check LFTs and TSH today.  Should have regular eye exam.  - Continue Xarelto  20 mg daily.   3. ETOH abuse: Suspect this contributes to cardiomyopathy.  He has cut back but still drinks moderately, asked him to cut back further.   4. Tobacco abuse: No longer smoking but does smoke marijuana on some days 5. VT: Had VT ablation at [facility] in 4/25 but still with frequent NSVT and VT, most recently treated by [facility].  He has not had syncope.  - Continue amiodarone  200 mg bid, check LFTs and TSH today.  Needs regular eye exam.  - Continue sotalol .  - I am going to have him followup with EP for initiation of quinidine.   - I will increase Toprol  XL to 50 mg daily. - If initiation of quinidine is not helpful and ventricular arrhythmias continue/worsen, will need to consider heart transplant.   - We discussed cutting back on both caffeine and ETOH (drinks pot of coffee daily).  6. OSA: Use BiPAP.   Arrange EP followup for quinidine initiation.  Followup with me in 6 wks.   I spent 45 minutes reviewing records, interpreting echo, interviewing/examining patient, discussing with EP colleagues, and managing orders.    [name] Bourdon, MD 01/22/24

## 2024-01-22 NOTE — Patient Instructions (Addendum)
 Great to see you today!!!  Medication Changes:  INCREASE Metoprolol  XL to 50 mg Daily  Lab Work:  Labs done today, your results will be available in MyChart, we will contact you for abnormal readings.   Testing/Procedures:  Heart Catheterization, see instructions below  Referrals:  You have been referred to follow-up with Dr Lawana Pray asap, his office will call you for an appointment  Special Instructions // Education:  Do the following things EVERYDAY: Weigh yourself in the morning before breakfast. Write it down and keep it in a log. Take your medicines as prescribed Eat low salt foods--Limit salt (sodium) to 2000 mg per day.  Stay as active as you can everyday Limit all fluids for the day to less than 2 liters   CATHETERIZATION INSTRUCTIONS:  You are scheduled for a Cardiac Catheterization on Thursday, May 15 with Dr. Peder Bourdon.  1. Please arrive at the [facility] (Main Entrance A) at [facility]: [address] [place], [place] [address] at 7:00 AM (This time is 2 hour(s) before your procedure to ensure your preparation).   Free valet parking service is available. You will check in at ADMITTING. The support person will be asked to wait in the waiting room.  It is OK to have someone drop you off and come back when you are ready to be discharged.    Special note: Every effort is made to have your procedure done on time. Please understand that emergencies sometimes delay scheduled procedures.  2. Diet: Do not eat solid foods after midnight.  The patient may have clear liquids until 5am upon the day of the procedure.  3. Labs: DONE TODAY  4. Medication instructions in preparation for your procedure:   DO NOT TAKE XARELTO  WED 5/14 OR THUR 5/15  THURSDAY 5/15 AM DO NOT TAKE: Torsemide  or Spironolactone   On the morning of your procedure, take any morning medicines NOT listed above.  You may use sips of water .  5. Plan to go home the same day, you will only  stay overnight if medically necessary. 6. Bring a current list of your medications and current insurance cards. 7. You MUST have a responsible person to drive you home. 8. Someone MUST be with you the first 24 hours after you arrive home or your discharge will be delayed. 9. Please wear clothes that are easy to get on and off and wear slip-on shoes.  Thank you for allowing us  to care for you!   -- [facility]   Follow-Up in: 6 weeks   At the [facility], you and your health needs are our priority. We have a designated team specialized in the treatment of Heart Failure. This Care Team includes your primary Heart Failure Specialized Cardiologist (physician), Advanced Practice Providers (APPs- Physician Assistants and Nurse Practitioners), and Pharmacist who all work together to provide you with the care you need, when you need it.   You may see any of the following providers on your designated Care Team at your next follow up:  Dr. Jules Oar Dr. Peder Bourdon Dr. Alwin Baars Dr. Judyth Nunnery Nieves Bars, NP Ruddy Corral, [place] [facility] [place], [place] Dennise Fitz, NP [place] Lee, NP Luster Salters, PharmD   Please be sure to bring in all your medications bottles to every appointment.   Need to Contact Us :  If you have any questions or concerns before your next appointment please send us  a message through mychart or call our  office at [phone].    TO LEAVE A MESSAGE FOR THE NURSE SELECT OPTION 2, PLEASE LEAVE A MESSAGE INCLUDING: YOUR NAME DATE OF BIRTH CALL BACK NUMBER REASON FOR CALL**this is important as we prioritize the call backs  YOU WILL RECEIVE A CALL BACK THE SAME DAY AS LONG AS YOU CALL BEFORE 4:00 PM

## 2024-01-23 ENCOUNTER — Other Ambulatory Visit (HOSPITAL_COMMUNITY): Payer: Self-pay | Admitting: *Deleted

## 2024-01-23 ENCOUNTER — Telehealth (HOSPITAL_COMMUNITY): Payer: Self-pay | Admitting: *Deleted

## 2024-01-23 DIAGNOSIS — I5022 Chronic systolic (congestive) heart failure: Secondary | ICD-10-CM

## 2024-01-23 MED ORDER — TORSEMIDE 20 MG PO TABS: 40.0000 mg | ORAL_TABLET | Freq: Two times a day (BID) | ORAL | Status: DC

## 2024-01-23 NOTE — Addendum Note (Signed)
 Addended by: Glorietta Lark on: 01/23/2024 05:08 PM   Modules accepted: Orders

## 2024-01-23 NOTE — Telephone Encounter (Signed)
 Pt scheduled to see Michaelle Adolphus, PA 5/30.  Forwarding note to him for his FYI for upcoming appt or recommend sooner appt if needed.

## 2024-01-23 NOTE — Telephone Encounter (Signed)
 Pt aware, agreeable, and verbalized understanding, bmet added to procedure orders

## 2024-01-23 NOTE — Telephone Encounter (Signed)
-----   Message from Peder Bourdon sent at 01/22/2024  9:09 PM EDT ----- Creatinine higher, decrease torsemide  to 40 mg bid. BMET 10 days.

## 2024-01-24 NOTE — Telephone Encounter (Signed)
 Spoke to [facility] pharmacy -- they can obtain 200 mg tablets.  If they order they will have the supply the next day. Areatha Ku made aware for next weeks appt.

## 2024-01-28 ENCOUNTER — Encounter: Payer: Self-pay | Admitting: Cardiovascular Disease

## 2024-01-28 NOTE — Progress Notes (Unsigned)
  Electrophysiology Office Note:   ID:  [name] [name], DOB [birth year], MRN [id number]  Primary Cardiologist: Peder Bourdon, MD Electrophysiologist: Richardo Chandler, MD *** {Click to update primary MD,subspecialty MD or APP then REFRESH:1}    History of Present Illness:   [name] [name] is a 63 y.o. male with h/o *** seen today for {VISITTYPE:28148}  Review of systems complete and found to be negative unless listed in HPI.   EP Information / Studies Reviewed:    EKG is not ordered today. EKG from 01/22/2024 reviewed which showed AV dual paced rhythm at 80 bpm, QRS 196, QTc by auto EKG 578 ms        ICD Interrogation-  reviewed in detail today,  See PACEART report.  Arrhythmia/Device History Medtronic BiV ICD implanted 2016, gen change 02/2022 for CHF   Physical Exam:   VS:  There were no vitals taken for this visit.   Wt Readings from Last 3 Encounters:  01/22/24 (!) 328 lb (148.8 kg)  01/09/24 (!) 330 lb (149.7 kg)  01/09/24 (!) 329 lb (149.2 kg)     GEN: No acute distress *** NECK: No JVD; No carotid bruits CARDIAC: {EPRHYTHM:28826}, no murmurs, rubs, gallops RESPIRATORY:  Clear to auscultation without rales, wheezing or rhonchi  ABDOMEN: Soft, non-tender, non-distended EXTREMITIES:  {EDEMA LEVEL:28147::"No"} edema; No deformity   ASSESSMENT AND PLAN:    Chronic systolic CHF  s/p Medtronic CRT-D  euvolemic today Stable on an appropriate medical regimen Normal ICD function See Pace Art report No changes today  Recurrent VT Drug refractory Have tried Amiodarone  + Sotalol  + Ranolazine  Mexitil ineffective Last notes mention considering Sotalol  and referral for advanced therapies Planned for RHC 01/31/2024  Disposition:   Follow up with {EPPROVIDERS:28135} {EPFOLLOW UP:28173}   Signed, Tylene Galla, PA-C

## 2024-01-29 ENCOUNTER — Other Ambulatory Visit (HOSPITAL_COMMUNITY): Payer: Self-pay

## 2024-01-29 ENCOUNTER — Encounter: Payer: Self-pay | Admitting: Student

## 2024-01-29 ENCOUNTER — Ambulatory Visit: Attending: Student | Admitting: Student

## 2024-01-29 ENCOUNTER — Other Ambulatory Visit: Payer: Self-pay

## 2024-01-29 VITALS — BP 90/60 | HR 84 | Ht 77.0 in | Wt 327.0 lb

## 2024-01-29 DIAGNOSIS — I493 Ventricular premature depolarization: Secondary | ICD-10-CM

## 2024-01-29 DIAGNOSIS — I472 Ventricular tachycardia, unspecified: Secondary | ICD-10-CM

## 2024-01-29 DIAGNOSIS — I5022 Chronic systolic (congestive) heart failure: Secondary | ICD-10-CM

## 2024-01-29 DIAGNOSIS — Z79899 Other long term (current) drug therapy: Secondary | ICD-10-CM | POA: Diagnosis not present

## 2024-01-29 LAB — CUP PACEART INCLINIC DEVICE CHECK
Battery Remaining Longevity: 57 mo
Battery Voltage: 2.97 V
Brady Statistic AP VP Percent: 98.86 %
Brady Statistic AP VS Percent: 1.05 %
Brady Statistic AS VP Percent: 0.06 %
Brady Statistic AS VS Percent: 0.02 %
Brady Statistic RA Percent Paced: 99.82 %
Brady Statistic RV Percent Paced: 98.8 %
Date Time Interrogation Session: 20250513125738
HighPow Impedance: 64 Ohm
Implantable Lead Connection Status: 753985
Implantable Lead Connection Status: 753985
Implantable Lead Connection Status: 753985
Implantable Lead Implant Date: 20160727
Implantable Lead Implant Date: 20160727
Implantable Lead Implant Date: 20160727
Implantable Lead Location: 753858
Implantable Lead Location: 753859
Implantable Lead Location: 753860
Implantable Lead Model: 4598
Implantable Lead Model: 5076
Implantable Lead Model: 6935
Implantable Pulse Generator Implant Date: 20230605
Lead Channel Impedance Value: 165.029
Lead Channel Impedance Value: 165.029
Lead Channel Impedance Value: 175.622
Lead Channel Impedance Value: 175.622
Lead Channel Impedance Value: 180.5 Ohm
Lead Channel Impedance Value: 304 Ohm
Lead Channel Impedance Value: 342 Ohm
Lead Channel Impedance Value: 361 Ohm
Lead Channel Impedance Value: 361 Ohm
Lead Channel Impedance Value: 361 Ohm
Lead Channel Impedance Value: 418 Ohm
Lead Channel Impedance Value: 456 Ohm
Lead Channel Impedance Value: 475 Ohm
Lead Channel Impedance Value: 551 Ohm
Lead Channel Impedance Value: 551 Ohm
Lead Channel Impedance Value: 589 Ohm
Lead Channel Impedance Value: 589 Ohm
Lead Channel Impedance Value: 608 Ohm
Lead Channel Pacing Threshold Amplitude: 0.625 V
Lead Channel Pacing Threshold Amplitude: 0.75 V
Lead Channel Pacing Threshold Amplitude: 1.25 V
Lead Channel Pacing Threshold Pulse Width: 0.4 ms
Lead Channel Pacing Threshold Pulse Width: 0.4 ms
Lead Channel Pacing Threshold Pulse Width: 0.4 ms
Lead Channel Sensing Intrinsic Amplitude: 1.625 mV
Lead Channel Sensing Intrinsic Amplitude: 3.25 mV
Lead Channel Sensing Intrinsic Amplitude: 4.875 mV
Lead Channel Sensing Intrinsic Amplitude: 7.875 mV
Lead Channel Setting Pacing Amplitude: 1.5 V
Lead Channel Setting Pacing Amplitude: 2 V
Lead Channel Setting Pacing Amplitude: 2.25 V
Lead Channel Setting Pacing Pulse Width: 0.4 ms
Lead Channel Setting Pacing Pulse Width: 0.4 ms
Lead Channel Setting Sensing Sensitivity: 0.3 mV

## 2024-01-29 MED ORDER — QUINIDINE SULFATE 200 MG PO TABS
200.0000 mg | ORAL_TABLET | Freq: Three times a day (TID) | ORAL | 6 refills | Status: DC
  Filled 2024-01-29: qty 90, 30d supply, fill #0

## 2024-01-29 NOTE — Patient Instructions (Signed)
 Medication Instructions:  Stop sotalol  Start quinidine 200 mg three times daily--start taking this on Thursday evening *If you need a refill on your cardiac medications before your next appointment, please call your pharmacy*  Lab Work: None ordered If you have labs (blood work) drawn today and your tests are completely normal, you will receive your results only by: MyChart Message (if you have MyChart) OR A paper copy in the mail If you have any lab test that is abnormal or we need to change your treatment, we will call you to review the results.  Follow-Up: At [facility], you and your health needs are our priority.  As part of our continuing mission to provide you with exceptional heart care, our providers are all part of one team.  This team includes your primary Cardiologist (physician) and Advanced Practice Providers or APPs (Physician Assistants and Nurse Practitioners) who all work together to provide you with the care you need, when you need it.  Your next appointment:   3-4 week(s)  Provider:   Bambi Lever "Michaelle Adolphus, PA-C    Schedule 1 week nurse visit for an EKG due to quinidine start.

## 2024-01-30 ENCOUNTER — Other Ambulatory Visit (HOSPITAL_COMMUNITY): Payer: Self-pay

## 2024-01-31 ENCOUNTER — Encounter (HOSPITAL_COMMUNITY): Payer: Self-pay | Admitting: Cardiology

## 2024-01-31 ENCOUNTER — Other Ambulatory Visit: Payer: Self-pay

## 2024-01-31 ENCOUNTER — Ambulatory Visit (HOSPITAL_COMMUNITY)
Admission: RE | Admit: 2024-01-31 | Discharge: 2024-01-31 | Disposition: A | Discharge status: Home / Self Care | Attending: Cardiology | Admitting: Cardiology

## 2024-01-31 ENCOUNTER — Other Ambulatory Visit (HOSPITAL_COMMUNITY): Payer: Self-pay

## 2024-01-31 ENCOUNTER — Encounter (HOSPITAL_COMMUNITY)
Admission: RE | Disposition: A | Discharge status: Home / Self Care | Payer: Self-pay | Source: Home / Self Care | Attending: Cardiology

## 2024-01-31 DIAGNOSIS — F12929 Cannabis use, unspecified with intoxication, unspecified: Secondary | ICD-10-CM | POA: Insufficient documentation

## 2024-01-31 DIAGNOSIS — Z95810 Presence of automatic (implantable) cardiac defibrillator: Secondary | ICD-10-CM | POA: Diagnosis not present

## 2024-01-31 DIAGNOSIS — Z87891 Personal history of nicotine dependence: Secondary | ICD-10-CM | POA: Insufficient documentation

## 2024-01-31 DIAGNOSIS — I428 Other cardiomyopathies: Secondary | ICD-10-CM | POA: Diagnosis not present

## 2024-01-31 DIAGNOSIS — I5022 Chronic systolic (congestive) heart failure: Secondary | ICD-10-CM | POA: Insufficient documentation

## 2024-01-31 DIAGNOSIS — Z79899 Other long term (current) drug therapy: Secondary | ICD-10-CM | POA: Insufficient documentation

## 2024-01-31 DIAGNOSIS — I472 Ventricular tachycardia, unspecified: Secondary | ICD-10-CM | POA: Diagnosis not present

## 2024-01-31 DIAGNOSIS — I110 Hypertensive heart disease with heart failure: Secondary | ICD-10-CM | POA: Diagnosis present

## 2024-01-31 DIAGNOSIS — F1010 Alcohol abuse, uncomplicated: Secondary | ICD-10-CM | POA: Diagnosis not present

## 2024-01-31 DIAGNOSIS — I480 Paroxysmal atrial fibrillation: Secondary | ICD-10-CM | POA: Diagnosis not present

## 2024-01-31 DIAGNOSIS — Z7901 Long term (current) use of anticoagulants: Secondary | ICD-10-CM | POA: Insufficient documentation

## 2024-01-31 DIAGNOSIS — I509 Heart failure, unspecified: Secondary | ICD-10-CM | POA: Diagnosis not present

## 2024-01-31 DIAGNOSIS — G4733 Obstructive sleep apnea (adult) (pediatric): Secondary | ICD-10-CM | POA: Diagnosis not present

## 2024-01-31 HISTORY — PX: RIGHT HEART CATH: CATH118263

## 2024-01-31 LAB — POCT I-STAT EG7
Acid-Base Excess: 2 mmol/L (ref 0.0–2.0)
Acid-Base Excess: 3 mmol/L — ABNORMAL HIGH (ref 0.0–2.0)
Bicarbonate: 27.3 mmol/L (ref 20.0–28.0)
Bicarbonate: 28.9 mmol/L — ABNORMAL HIGH (ref 20.0–28.0)
Calcium, Ion: 1.22 mmol/L (ref 1.15–1.40)
Calcium, Ion: 1.25 mmol/L (ref 1.15–1.40)
HCT: 26 % — ABNORMAL LOW (ref 39.0–52.0)
HCT: 27 % — ABNORMAL LOW (ref 39.0–52.0)
Hemoglobin: 8.8 g/dL — ABNORMAL LOW (ref 13.0–17.0)
Hemoglobin: 9.2 g/dL — ABNORMAL LOW (ref 13.0–17.0)
O2 Saturation: 60 %
O2 Saturation: 61 %
Potassium: 4.4 mmol/L (ref 3.5–5.1)
Potassium: 4.5 mmol/L (ref 3.5–5.1)
Sodium: 139 mmol/L (ref 135–145)
Sodium: 140 mmol/L (ref 135–145)
TCO2: 29 mmol/L (ref 22–32)
TCO2: 30 mmol/L (ref 22–32)
pCO2, Ven: 47.5 mmHg (ref 44–60)
pCO2, Ven: 48.2 mmHg (ref 44–60)
pH, Ven: 7.367 (ref 7.25–7.43)
pH, Ven: 7.386 (ref 7.25–7.43)
pO2, Ven: 32 mmHg (ref 32–45)
pO2, Ven: 33 mmHg (ref 32–45)

## 2024-01-31 SURGERY — RIGHT HEART CATH
Anesthesia: LOCAL

## 2024-01-31 MED ORDER — SODIUM CHLORIDE 0.9% FLUSH: 3.0000 mL | INTRAVENOUS | Status: DC | PRN

## 2024-01-31 MED ORDER — HYDRALAZINE HCL 20 MG/ML IJ SOLN: 10.0000 mg | INTRAMUSCULAR | Status: DC | PRN

## 2024-01-31 MED ORDER — TORSEMIDE 20 MG PO TABS: ORAL_TABLET | ORAL | Status: DC

## 2024-01-31 MED ORDER — LABETALOL HCL 5 MG/ML IV SOLN: 10.0000 mg | INTRAVENOUS | Status: DC | PRN

## 2024-01-31 MED ORDER — HEPARIN (PORCINE) IN NACL 1000-0.9 UT/500ML-% IV SOLN
INTRAVENOUS | Status: DC | PRN
  Administered 2024-01-31: 500 mL

## 2024-01-31 MED ORDER — ONDANSETRON HCL 4 MG/2ML IJ SOLN: 4.0000 mg | Freq: Four times a day (QID) | INTRAMUSCULAR | Status: DC | PRN

## 2024-01-31 MED ORDER — SODIUM CHLORIDE 0.9% FLUSH: 3.0000 mL | Freq: Two times a day (BID) | INTRAVENOUS | Status: DC

## 2024-01-31 MED ORDER — ACETAMINOPHEN 325 MG PO TABS: 650.0000 mg | ORAL_TABLET | ORAL | Status: DC | PRN

## 2024-01-31 MED ORDER — SODIUM CHLORIDE 0.9 % IV SOLN
INTRAVENOUS | Status: DC
Start: 2024-01-31 — End: 2024-01-31

## 2024-01-31 MED ORDER — SODIUM CHLORIDE 0.9 % IV SOLN: 250.0000 mL | INTRAVENOUS | Status: DC | PRN

## 2024-01-31 MED ORDER — LIDOCAINE HCL (PF) 1 % IJ SOLN
INTRAMUSCULAR | Status: DC | PRN
  Administered 2024-01-31: 2 mL

## 2024-01-31 MED ORDER — LIDOCAINE HCL (PF) 1 % IJ SOLN
INTRAMUSCULAR | Status: AC
  Filled 2024-01-31: qty 30

## 2024-01-31 SURGICAL SUPPLY — 5 items
CATH SWAN GANZ 7F STRAIGHT (CATHETERS) IMPLANT
GLIDESHEATH SLENDER 7FR .021G (SHEATH) IMPLANT
PACK CARDIAC CATHETERIZATION (CUSTOM PROCEDURE TRAY) ×1 IMPLANT
TRANSDUCER W/STOPCOCK (MISCELLANEOUS) IMPLANT
TUBING ART PRESS 72 MALE/FEM (TUBING) IMPLANT

## 2024-01-31 NOTE — Progress Notes (Signed)
 Patient was given discharge instructions. He verbalized understanding.

## 2024-01-31 NOTE — Discharge Instructions (Addendum)
 Brachial Site Care   This sheet gives you information about how to care for yourself after your procedure. Your health care provider may also give you more specific instructions. If you have problems or questions, contact your health care provider. What can I expect after the procedure? After the procedure, it is common to have: Bruising and tenderness at the catheter insertion area. Follow these instructions at home:  Insertion site care Follow instructions from your health care provider about how to take care of your insertion site. Make sure you: Wash your hands with soap and water  before you change your bandage (dressing). If soap and water  are not available, use hand sanitizer. Remove your dressing as told by your health care provider. In 24 hours Check your insertion site every day for signs of infection. Check for: Redness, swelling, or pain. Pus or a bad smell. Warmth. You may shower 24-48 hours after the procedure. Do not apply powder or lotion to the site.  Activity For 24 hours after the procedure, or as directed by your health care provider: Do not push or pull heavy objects with the affected arm. Do not drive yourself home from the hospital or clinic. You may drive 24 hours after the procedure unless your health care provider tells you not to. Do not lift anything that is heavier than 10 lb (4.5 kg), or the limit that you are told, until your health care provider says that it is safe.  For 24 hours Brachial Site Care   This sheet gives you information about how to care for yourself after your procedure. Your health care provider may also give you more specific instructions. If you have problems or questions, contact your health care provider. What can I expect after the procedure? After the procedure, it is common to have: Bruising and tenderness at the catheter insertion area. Follow these instructions at home:  Insertion site care Follow instructions from your health  care provider about how to take care of your insertion site. Make sure you: Wash your hands with soap and water  before you change your bandage (dressing). If soap and water  are not available, use hand sanitizer. Remove your dressing as told by your health care provider. In 24 hours Check your insertion site every day for signs of infection. Check for: Redness, swelling, or pain. Pus or a bad smell. Warmth. You may shower 24-48 hours after the procedure. Do not apply powder or lotion to the site.  Activity For 24 hours after the procedure, or as directed by your health care provider: Do not push or pull heavy objects with the affected arm. Do not drive yourself home from the hospital or clinic. You may drive 24 hours after the procedure unless your health care provider tells you not to. Do not lift anything that is heavier than 10 lb (4.5 kg), or the limit that you are told, until your health care provider says that it is safe.  For 24 hours   ** Increase torsemide  back to 60 mg in the morning and 40 mg in the afternoon. **

## 2024-01-31 NOTE — Interval H&P Note (Signed)
 History and Physical Interval Note:  01/31/2024 7:59 AM  [name] [name]  has presented today for surgery, with the diagnosis of hf.  The various methods of treatment have been discussed with the patient and family. After consideration of risks, benefits and other options for treatment, the patient has consented to  Procedure(s): RIGHT HEART CATH (N/A) as a surgical intervention.  The patient's history has been reviewed, patient examined, no change in status, stable for surgery.  I have reviewed the patient's chart and labs.  Questions were answered to the patient's satisfaction.     Sulema Braid [employer]

## 2024-02-01 ENCOUNTER — Ambulatory Visit: Payer: Self-pay | Admitting: Family

## 2024-02-01 ENCOUNTER — Telehealth (HOSPITAL_COMMUNITY): Payer: Self-pay

## 2024-02-01 ENCOUNTER — Ambulatory Visit: Payer: Self-pay | Admitting: Cardiovascular Disease

## 2024-02-01 DIAGNOSIS — I5022 Chronic systolic (congestive) heart failure: Secondary | ICD-10-CM

## 2024-02-01 NOTE — Telephone Encounter (Signed)
 Per Dr. Mitzie Anda, patient needs 10 day lab post medication changes. Labs ordered and scheduled

## 2024-02-04 ENCOUNTER — Ambulatory Visit: Attending: Cardiovascular Disease

## 2024-02-04 DIAGNOSIS — Z95810 Presence of automatic (implantable) cardiac defibrillator: Secondary | ICD-10-CM | POA: Diagnosis not present

## 2024-02-04 DIAGNOSIS — I5022 Chronic systolic (congestive) heart failure: Secondary | ICD-10-CM | POA: Diagnosis not present

## 2024-02-06 ENCOUNTER — Telehealth: Payer: Self-pay | Admitting: *Deleted

## 2024-02-06 ENCOUNTER — Telehealth: Payer: Self-pay

## 2024-02-06 NOTE — Progress Notes (Signed)
  Received: Today Mariella Shore, RN  Giulietta Prokop, Myrtie Atkinson, RN Will route to provider for review and recommendation

## 2024-02-06 NOTE — Telephone Encounter (Signed)
 Received message from heart failure RN, Christiane Cowing Short:  Hey,   5/19 Carelink report shows ATP on 5/17 which is after patient's 5/13 office visit with Jonelle Neri and his heart cath 5/15 with [facility].    Can you review and follow up if needed.   Thanks  Christiane Cowing   _________________________________________________________________________  Called patient and he stated that he did not feel his device giving him ATP treatment He said he felt a little dizzy, but that it happens so frequently to him that he has stopped paying it any mind and that it wasn't any worse than it normally effects him. Patient informed of driving restrictions for 6 months per [place]CDOT Patient acknowledged understanding of restriction for 6 months Patient scheduled for OV 02/07/24 for 1 week follow up and EKG after starting Quinidine  Routing to providers for review and awareness

## 2024-02-06 NOTE — Progress Notes (Signed)
 EPIC Encounter for ICM Monitoring  Patient Name: [name] [name] is a 63 y.o. male Date: 02/06/2024 Primary Care Physican: Senaida Dama, NP Primary Cardiologist: Mitzie Anda Electrophysiologist: Mealor BiV Pacing: 98.4% 03/21/2023 Weight: 320 lbs 07/06/2023 Weight: 320 lbs 11/29/2023 Weight: 323 lbs 01/29/2024 Office Weight: 327 lbs   Since 29-Jan-2024 Monitored VT-NS (>4 beats, >150 bpm)   27   VT/VT Pace-Terminated Episodes  1 of 1    Attempted call to patient and unable to reach. Transmission results reviewed.  Heart cath on 5/15 by Dr Mitzie Anda.   Optivol Thoracic impedance suggesting normal fluid levels within the last month.   Message sent to device clinic on 5/21 regarding ATP 5/17.    Prescribed:  Torsemide  20 mg 3 tablets (60 mg total) by mouth every morning and 2 tablets (40 mg total) every evening Potassium 20 mEq take 1 tablet by mouth daily Spironolactone  25 mg take 1 tablet daily.   Labs: 01/22/2024 Creatinine 1.88, BUN 26, Potassium 4.4, Sodium 140, GFR 40 01/09/2024 Creatinine 1.55, BUN 21, Potassium 4.6, Sodium 139, GFR 51 12/28/2023 Creatinine 1.33, BUN 29, Potassium 4.5, Sodium 142, GFR 61 12/21/2023 Creatinine 1.89, BUN 46, Potassium 5.1, Sodium 143, GFR 40 07/17/2023 Creatinine 1.64, BUN 18, Potassium 5.0, Sodium 139, GFR 48 07/02/2023 Creatinine 1.57, BUN 27, Potassium 4.2, Sodium 140  A complete set of results can be found in Results Review.   Recommendations:  Unable to reach.     Follow-up plan: ICM clinic phone appointment on 04/07/2024.   91 day device clinic remote transmission 02/20/2024.     EP/Cardiology Office Visits:  02/21/2024 with Michaelle Adolphus, PA.   03/26/2024 with Dr Mitzie Anda.   Copy of ICM check sent to Dr. Arlester Ladd.   3 month ICM trend: 02/04/2024.    12-14 Month ICM trend:     Almyra Jain, RN 02/06/2024 1:25 PM

## 2024-02-06 NOTE — Telephone Encounter (Signed)
Remote ICM transmission received.  Attempted call to patient regarding ICM remote transmission and mail box is full. 

## 2024-02-07 ENCOUNTER — Ambulatory Visit

## 2024-02-07 ENCOUNTER — Telehealth: Payer: Self-pay

## 2024-02-07 DIAGNOSIS — I472 Ventricular tachycardia, unspecified: Secondary | ICD-10-CM

## 2024-02-07 NOTE — Telephone Encounter (Signed)
 Patient came in for EKG. He started quinidine 200 mg TID. He states it was causing his chest to feel really tight. He tried 200 mg BID and it was still causing his chest to feel tight. He started taking it once day but his chest still gets really tight. Over the weekend it really scared him.  Being that he was not taking it how prescribed I spoke with DOD Dr. Audery Blazing and he states to not do EKG and for patient to stop the medication and forward to EP for next steps.

## 2024-02-08 NOTE — Telephone Encounter (Signed)
 Spoke with pt and advised per Michaelle Adolphus, PA-C recommendation is to refer pt to see Dr Lennette Quiver with [facility].  Referral has been placed.  Pt advised to keep appointment with PA on 02/21/2024.  Pt states he is currently feeling well. And will plan to keep appointment as scheduled.

## 2024-02-12 ENCOUNTER — Other Ambulatory Visit (HOSPITAL_COMMUNITY)

## 2024-02-15 ENCOUNTER — Encounter: Payer: Self-pay | Admitting: Internal Medicine

## 2024-02-15 ENCOUNTER — Ambulatory Visit (HOSPITAL_COMMUNITY)
Admission: RE | Admit: 2024-02-15 | Discharge: 2024-02-15 | Disposition: A | Discharge status: Home / Self Care | Source: Ambulatory Visit | Attending: Internal Medicine | Admitting: Internal Medicine

## 2024-02-15 ENCOUNTER — Ambulatory Visit (HOSPITAL_COMMUNITY): Payer: Self-pay | Admitting: Cardiology

## 2024-02-15 ENCOUNTER — Encounter: Admitting: Student

## 2024-02-15 ENCOUNTER — Encounter: Admitting: Cardiology

## 2024-02-15 DIAGNOSIS — I5022 Chronic systolic (congestive) heart failure: Secondary | ICD-10-CM | POA: Diagnosis present

## 2024-02-15 LAB — BASIC METABOLIC PANEL WITH GFR
Anion gap: 9 (ref 5–15)
BUN: 37 mg/dL — ABNORMAL HIGH (ref 8–23)
CO2: 27 mmol/L (ref 22–32)
Calcium: 9.3 mg/dL (ref 8.9–10.3)
Chloride: 104 mmol/L (ref 98–111)
Creatinine, Ser: 1.9 mg/dL — ABNORMAL HIGH (ref 0.61–1.24)
GFR, Estimated: 40 mL/min — ABNORMAL LOW (ref >60)
Glucose, Bld: 107 mg/dL — ABNORMAL HIGH (ref 70–99)
Potassium: 4.7 mmol/L (ref 3.5–5.1)
Sodium: 140 mmol/L (ref 135–145)

## 2024-02-15 MED ORDER — TORSEMIDE 20 MG PO TABS: 40.0000 mg | ORAL_TABLET | Freq: Two times a day (BID) | ORAL | Status: DC

## 2024-02-15 NOTE — Telephone Encounter (Addendum)
 Patient stated he is taking 60 mg / 40mg . Told patient to change per Dr.[name]'s note.  Pt aware, agreeable, and verbalized understanding Labs ordered and scheduled med list update   ----- Message from Peder Bourdon sent at 02/15/2024  2:18 PM EDT ----- If he is currently taking torsemide  60 qam/40 qpm, would drop it to 40 mg bid. BMET 10 days.

## 2024-02-20 ENCOUNTER — Ambulatory Visit (INDEPENDENT_AMBULATORY_CARE_PROVIDER_SITE_OTHER): Payer: Medicare HMO

## 2024-02-20 ENCOUNTER — Telehealth: Payer: Self-pay

## 2024-02-20 DIAGNOSIS — I428 Other cardiomyopathies: Secondary | ICD-10-CM

## 2024-02-20 LAB — CUP PACEART REMOTE DEVICE CHECK
Battery Remaining Longevity: 55 mo
Battery Voltage: 2.97 V
Brady Statistic AP VP Percent: 98.29 %
Brady Statistic AP VS Percent: 1.53 %
Brady Statistic AS VP Percent: 0.07 %
Brady Statistic AS VS Percent: 0.11 %
Brady Statistic RA Percent Paced: 99.35 %
Brady Statistic RV Percent Paced: 97.9 %
Date Time Interrogation Session: 20250604022725
HighPow Impedance: 63 Ohm
Implantable Lead Connection Status: 753985
Implantable Lead Connection Status: 753985
Implantable Lead Connection Status: 753985
Implantable Lead Implant Date: 20160727
Implantable Lead Implant Date: 20160727
Implantable Lead Implant Date: 20160727
Implantable Lead Location: 753858
Implantable Lead Location: 753859
Implantable Lead Location: 753860
Implantable Lead Model: 4598
Implantable Lead Model: 5076
Implantable Lead Model: 6935
Implantable Pulse Generator Implant Date: 20230605
Lead Channel Impedance Value: 160.941
Lead Channel Impedance Value: 165.029
Lead Channel Impedance Value: 175.622
Lead Channel Impedance Value: 175.622
Lead Channel Impedance Value: 180.5 Ohm
Lead Channel Impedance Value: 304 Ohm
Lead Channel Impedance Value: 342 Ohm
Lead Channel Impedance Value: 342 Ohm
Lead Channel Impedance Value: 361 Ohm
Lead Channel Impedance Value: 361 Ohm
Lead Channel Impedance Value: 399 Ohm
Lead Channel Impedance Value: 399 Ohm
Lead Channel Impedance Value: 418 Ohm
Lead Channel Impedance Value: 532 Ohm
Lead Channel Impedance Value: 532 Ohm
Lead Channel Impedance Value: 551 Ohm
Lead Channel Impedance Value: 589 Ohm
Lead Channel Impedance Value: 589 Ohm
Lead Channel Pacing Threshold Amplitude: 0.5 V
Lead Channel Pacing Threshold Amplitude: 0.875 V
Lead Channel Pacing Threshold Amplitude: 1.25 V
Lead Channel Pacing Threshold Pulse Width: 0.4 ms
Lead Channel Pacing Threshold Pulse Width: 0.4 ms
Lead Channel Pacing Threshold Pulse Width: 0.4 ms
Lead Channel Sensing Intrinsic Amplitude: 1.5 mV
Lead Channel Sensing Intrinsic Amplitude: 1.5 mV
Lead Channel Sensing Intrinsic Amplitude: 4.625 mV
Lead Channel Sensing Intrinsic Amplitude: 4.625 mV
Lead Channel Setting Pacing Amplitude: 1.5 V
Lead Channel Setting Pacing Amplitude: 2 V
Lead Channel Setting Pacing Amplitude: 2.25 V
Lead Channel Setting Pacing Pulse Width: 0.4 ms
Lead Channel Setting Pacing Pulse Width: 0.4 ms
Lead Channel Setting Sensing Sensitivity: 0.3 mV

## 2024-02-20 NOTE — Telephone Encounter (Signed)
 Alert received from [employer] for Presenting rhythm:  AP/BiV pace Previous HF transmission with 27 NSVT events 5/17, 1 sustained VT 5/17 @ 06:32, duration 19sec per device HR 136 pace terminated with 1 burst of ATP Current transmission with 13 Slow VT episodes, HR's 140-150, all pace terminated with 1-2 bursts of ATP.  468 NSVT events There has been an increase in ventricular ectopy per trends, route to triage high alert per protocol.  Pt has scheduled f/u 6/5.  ____________________________________  Patient called, denies any symptoms in last few days such as chest pain, palpitations, shortness of breath, dizziness or lightheadedness. No missed doses of medications. Pt has OV tomorrow 02/21/24 w/ Jonelle Neri, PA, patient aware to come.  Pt advised no driving x6 months per [place] DMV and aware of any symptoms arise to call 911 and go to ER. Pt voiced understanding and agreeable to plan.

## 2024-02-20 NOTE — Progress Notes (Unsigned)
  Electrophysiology Office Note:   ID:  [name] [name], DOB February 27, 1961, MRN [id number]  Primary Cardiologist: Peder Bourdon, MD Electrophysiologist: Richardo Chandler, MD      History of Present Illness:   [name] [name] is a 63 y.o. male with h/o recurrent PMVT despite AAD and ablation, Paroxysmal AF, NICM s/p ICD, PVCs, and SND seen today for acute visit due to recurrent VT.    Patient reports doing well overall. Wasn't particularly aware of ATP treated episodes end of April and 5/1. Agreeable to med change as he and Dr. Rodolfo Clan had discussed at length at visit in April. Planned for RHC later this week to potentially consider advanced therapies.   Review of systems complete and found to be negative unless listed in HPI.   EP Information / Studies Reviewed:    EKG is not ordered today. EKG from 01/29/2024 reviewed which showed AV dual paced rhythm at 84 bpm, QRS 156, QTc by auto EKG 531 ms        ICD Interrogation-  reviewed in detail today,  See PACEART report.  Arrhythmia/Device History Medtronic BiV ICD implanted 2016, gen change 02/2022 for CHF  S/p VT ablation 12/2023  Mexitil ineffective    Physical Exam:   VS:  There were no vitals taken for this visit.   Wt Readings from Last 3 Encounters:  01/31/24 (!) 325 lb (147.4 kg)  01/29/24 (!) 327 lb (148.3 kg)  01/22/24 (!) 328 lb (148.8 kg)     GEN: No acute distress  NECK: No JVD; No carotid bruits CARDIAC: Regular rate and rhythm, no murmurs, rubs, gallops RESPIRATORY:  Clear to auscultation without rales, wheezing or rhonchi  ABDOMEN: Soft, non-tender, non-distended EXTREMITIES:  No edema; No deformity   ASSESSMENT AND PLAN:    Chronic systolic CHF  s/p Medtronic CRT-D  NICM Volume status *** On an appropriate medical regimen Normal ICD function See Pace Art report No changes today  Recurrent VT Drug refractory Despite multiple AADs and combinations.  Mexitil ineffective RHC 01/31/2024 with preserved cardiac output but  low PAPi, suggesting a degree of RV failure Continue amiodarone  200 mg BID STOP sotalol  to let it wash out.  *** quinidine 200 mg  Recent labs stable.   Paroxysmal AF Continue Xarelto  20 mg daily Low burden given AADs as above.  Disposition:   Follow up with EP APP in 3-4 weeks. EKG next week.    Signed, Tylene Galla, PA-C

## 2024-02-21 ENCOUNTER — Ambulatory Visit: Payer: Self-pay | Admitting: Cardiovascular Disease

## 2024-02-21 ENCOUNTER — Ambulatory Visit: Attending: Student | Admitting: Student

## 2024-02-21 ENCOUNTER — Encounter: Payer: Self-pay | Admitting: Student

## 2024-02-21 VITALS — BP 134/62 | HR 75 | Ht 77.0 in | Wt 333.5 lb

## 2024-02-21 DIAGNOSIS — I472 Ventricular tachycardia, unspecified: Secondary | ICD-10-CM

## 2024-02-21 DIAGNOSIS — Z95810 Presence of automatic (implantable) cardiac defibrillator: Secondary | ICD-10-CM

## 2024-02-21 DIAGNOSIS — I493 Ventricular premature depolarization: Secondary | ICD-10-CM

## 2024-02-21 DIAGNOSIS — I4891 Unspecified atrial fibrillation: Secondary | ICD-10-CM | POA: Diagnosis not present

## 2024-02-21 DIAGNOSIS — I5022 Chronic systolic (congestive) heart failure: Secondary | ICD-10-CM | POA: Diagnosis not present

## 2024-02-21 DIAGNOSIS — Z79899 Other long term (current) drug therapy: Secondary | ICD-10-CM

## 2024-02-21 LAB — CUP PACEART INCLINIC DEVICE CHECK
Battery Remaining Longevity: 55 mo
Battery Voltage: 2.96 V
Brady Statistic AP VP Percent: 98.22 %
Brady Statistic AP VS Percent: 1.46 %
Brady Statistic AS VP Percent: 0.21 %
Brady Statistic AS VS Percent: 0.11 %
Brady Statistic RA Percent Paced: 99.29 %
Brady Statistic RV Percent Paced: 98.05 %
Date Time Interrogation Session: 20250605115126
HighPow Impedance: 61 Ohm
Implantable Lead Connection Status: 753985
Implantable Lead Connection Status: 753985
Implantable Lead Connection Status: 753985
Implantable Lead Implant Date: 20160727
Implantable Lead Implant Date: 20160727
Implantable Lead Implant Date: 20160727
Implantable Lead Location: 753858
Implantable Lead Location: 753859
Implantable Lead Location: 753860
Implantable Lead Model: 4598
Implantable Lead Model: 5076
Implantable Lead Model: 6935
Implantable Pulse Generator Implant Date: 20230605
Lead Channel Impedance Value: 165.029
Lead Channel Impedance Value: 165.029
Lead Channel Impedance Value: 175.622
Lead Channel Impedance Value: 175.622
Lead Channel Impedance Value: 180.5 Ohm
Lead Channel Impedance Value: 304 Ohm
Lead Channel Impedance Value: 342 Ohm
Lead Channel Impedance Value: 342 Ohm
Lead Channel Impedance Value: 361 Ohm
Lead Channel Impedance Value: 361 Ohm
Lead Channel Impedance Value: 399 Ohm
Lead Channel Impedance Value: 418 Ohm
Lead Channel Impedance Value: 418 Ohm
Lead Channel Impedance Value: 551 Ohm
Lead Channel Impedance Value: 589 Ohm
Lead Channel Impedance Value: 589 Ohm
Lead Channel Impedance Value: 608 Ohm
Lead Channel Impedance Value: 608 Ohm
Lead Channel Pacing Threshold Amplitude: 0.5 V
Lead Channel Pacing Threshold Amplitude: 0.875 V
Lead Channel Pacing Threshold Amplitude: 1.25 V
Lead Channel Pacing Threshold Pulse Width: 0.4 ms
Lead Channel Pacing Threshold Pulse Width: 0.4 ms
Lead Channel Pacing Threshold Pulse Width: 0.4 ms
Lead Channel Sensing Intrinsic Amplitude: 1.5 mV
Lead Channel Sensing Intrinsic Amplitude: 1.5 mV
Lead Channel Sensing Intrinsic Amplitude: 6.875 mV
Lead Channel Sensing Intrinsic Amplitude: 6.875 mV
Lead Channel Setting Pacing Amplitude: 1.5 V
Lead Channel Setting Pacing Amplitude: 2 V
Lead Channel Setting Pacing Amplitude: 2.25 V
Lead Channel Setting Pacing Pulse Width: 0.4 ms
Lead Channel Setting Pacing Pulse Width: 0.4 ms
Lead Channel Setting Sensing Sensitivity: 0.3 mV

## 2024-02-21 MED ORDER — SOTALOL HCL 80 MG PO TABS: 80.0000 mg | ORAL_TABLET | Freq: Two times a day (BID) | ORAL | 3 refills | Status: DC

## 2024-02-21 NOTE — Patient Instructions (Addendum)
 Medication Instructions:  Resume sotalol  80 mg BID (pt had already) STAY OFF quinidine    *If you need a refill on your cardiac medications before your next appointment, please call your pharmacy*  Lab Work: BMET and Mg today. If you have labs (blood work) drawn today and your tests are completely normal, you will receive your results only by: MyChart Message (if you have MyChart) OR A paper copy in the mail If you have any lab test that is abnormal or we need to change your treatment, we will call you to review the results.  Testing/Procedures: No testing/procedures were scheduled today  Follow-Up: At [facility], you and your health needs are our priority.  As part of our continuing mission to provide you with exceptional heart care, our providers are all part of one team.  This team includes your primary Cardiologist (physician) and Advanced Practice Providers or APPs (Physician Assistants and Nurse Practitioners) who all work together to provide you with the care you need, when you need it.  Your next appointment:   3 month(s) with MD  Provider:   You may see Efraim Grange, MD or one of the following Advanced Practice Providers on your designated Care Team:   Mertha Abrahams, [place] [address] [place], PA-C Suzann Riddle, NP Creighton Doffing, NP    We recommend signing up for the patient portal called "MyChart".  Sign up information is provided on this After Visit Summary.  MyChart is used to connect with patients for Virtual Visits (Telemedicine).  Patients are able to view lab/test results, encounter notes, upcoming appointments, etc.  Non-urgent messages can be sent to your provider as well.   To learn more about what you can do with MyChart, go to [email or web address].

## 2024-02-25 ENCOUNTER — Other Ambulatory Visit: Payer: Self-pay | Admitting: *Deleted

## 2024-02-25 ENCOUNTER — Other Ambulatory Visit (HOSPITAL_COMMUNITY)

## 2024-02-25 ENCOUNTER — Other Ambulatory Visit: Payer: Self-pay | Admitting: Student

## 2024-02-25 DIAGNOSIS — I5022 Chronic systolic (congestive) heart failure: Secondary | ICD-10-CM

## 2024-02-25 LAB — BASIC METABOLIC PANEL WITH GFR
BUN/Creatinine Ratio: 21 (ref 10–24)
BUN: 38 mg/dL — ABNORMAL HIGH (ref 8–27)
CO2: 24 mmol/L (ref 20–29)
Calcium: 9.3 mg/dL (ref 8.6–10.2)
Chloride: 104 mmol/L (ref 96–106)
Creatinine, Ser: 1.85 mg/dL — ABNORMAL HIGH (ref 0.76–1.27)
Glucose: 145 mg/dL — ABNORMAL HIGH (ref 70–99)
Potassium: 5 mmol/L (ref 3.5–5.2)
Sodium: 142 mmol/L (ref 134–144)
eGFR: 41 mL/min/{1.73_m2} — ABNORMAL LOW (ref >59)

## 2024-02-25 LAB — MAGNESIUM: Magnesium: 2.2 mg/dL (ref 1.6–2.3)

## 2024-02-26 ENCOUNTER — Ambulatory Visit: Payer: Self-pay | Admitting: Student

## 2024-02-26 LAB — BASIC METABOLIC PANEL WITH GFR
BUN/Creatinine Ratio: 20 (ref 10–24)
BUN: 38 mg/dL — ABNORMAL HIGH (ref 8–27)
CO2: 22 mmol/L (ref 20–29)
Calcium: 9.1 mg/dL (ref 8.6–10.2)
Chloride: 101 mmol/L (ref 96–106)
Creatinine, Ser: 1.93 mg/dL — ABNORMAL HIGH (ref 0.76–1.27)
Glucose: 77 mg/dL (ref 70–99)
Potassium: 5.4 mmol/L — ABNORMAL HIGH (ref 3.5–5.2)
Sodium: 140 mmol/L (ref 134–144)
eGFR: 39 mL/min/{1.73_m2} — ABNORMAL LOW (ref >59)

## 2024-02-26 LAB — MAGNESIUM: Magnesium: 2.2 mg/dL (ref 1.6–2.3)

## 2024-03-05 ENCOUNTER — Ambulatory Visit

## 2024-03-13 ENCOUNTER — Other Ambulatory Visit (HOSPITAL_COMMUNITY): Payer: Self-pay | Admitting: Cardiology

## 2024-03-19 ENCOUNTER — Other Ambulatory Visit (HOSPITAL_COMMUNITY): Payer: Self-pay | Admitting: Cardiology

## 2024-03-26 ENCOUNTER — Ambulatory Visit (HOSPITAL_COMMUNITY)
Admission: RE | Admit: 2024-03-26 | Discharge: 2024-03-26 | Disposition: A | Discharge status: Home / Self Care | Source: Ambulatory Visit | Attending: Cardiology | Admitting: Cardiology

## 2024-03-26 ENCOUNTER — Encounter (HOSPITAL_COMMUNITY): Payer: Self-pay | Admitting: Cardiology

## 2024-03-26 ENCOUNTER — Ambulatory Visit (HOSPITAL_COMMUNITY): Payer: Self-pay | Admitting: Cardiology

## 2024-03-26 VITALS — BP 100/60 | HR 73 | Wt 337.0 lb

## 2024-03-26 DIAGNOSIS — Z7901 Long term (current) use of anticoagulants: Secondary | ICD-10-CM | POA: Insufficient documentation

## 2024-03-26 DIAGNOSIS — Z79899 Other long term (current) drug therapy: Secondary | ICD-10-CM | POA: Insufficient documentation

## 2024-03-26 DIAGNOSIS — I2510 Atherosclerotic heart disease of native coronary artery without angina pectoris: Secondary | ICD-10-CM | POA: Insufficient documentation

## 2024-03-26 DIAGNOSIS — E8770 Fluid overload, unspecified: Secondary | ICD-10-CM | POA: Diagnosis not present

## 2024-03-26 DIAGNOSIS — I5022 Chronic systolic (congestive) heart failure: Secondary | ICD-10-CM | POA: Insufficient documentation

## 2024-03-26 DIAGNOSIS — I472 Ventricular tachycardia, unspecified: Secondary | ICD-10-CM | POA: Diagnosis present

## 2024-03-26 DIAGNOSIS — Z87440 Personal history of urinary (tract) infections: Secondary | ICD-10-CM | POA: Insufficient documentation

## 2024-03-26 DIAGNOSIS — Z95810 Presence of automatic (implantable) cardiac defibrillator: Secondary | ICD-10-CM | POA: Insufficient documentation

## 2024-03-26 DIAGNOSIS — I110 Hypertensive heart disease with heart failure: Secondary | ICD-10-CM | POA: Insufficient documentation

## 2024-03-26 DIAGNOSIS — F1010 Alcohol abuse, uncomplicated: Secondary | ICD-10-CM | POA: Insufficient documentation

## 2024-03-26 DIAGNOSIS — G4733 Obstructive sleep apnea (adult) (pediatric): Secondary | ICD-10-CM | POA: Diagnosis not present

## 2024-03-26 DIAGNOSIS — Z87891 Personal history of nicotine dependence: Secondary | ICD-10-CM | POA: Insufficient documentation

## 2024-03-26 DIAGNOSIS — I480 Paroxysmal atrial fibrillation: Secondary | ICD-10-CM | POA: Insufficient documentation

## 2024-03-26 DIAGNOSIS — I428 Other cardiomyopathies: Secondary | ICD-10-CM | POA: Insufficient documentation

## 2024-03-26 DIAGNOSIS — J449 Chronic obstructive pulmonary disease, unspecified: Secondary | ICD-10-CM | POA: Diagnosis not present

## 2024-03-26 LAB — CBC
HCT: 30.4 % — ABNORMAL LOW (ref 39.0–52.0)
Hemoglobin: 10 g/dL — ABNORMAL LOW (ref 13.0–17.0)
MCH: 33 pg (ref 26.0–34.0)
MCHC: 32.9 g/dL (ref 30.0–36.0)
MCV: 100.3 fL — ABNORMAL HIGH (ref 80.0–100.0)
Platelets: 159 K/uL (ref 150–400)
RBC: 3.03 MIL/uL — ABNORMAL LOW (ref 4.22–5.81)
RDW: 14.5 % (ref 11.5–15.5)
WBC: 3.9 K/uL — ABNORMAL LOW (ref 4.0–10.5)
nRBC: 0 % (ref 0.0–0.2)

## 2024-03-26 LAB — COMPREHENSIVE METABOLIC PANEL WITH GFR
ALT: 22 U/L (ref 0–44)
AST: 22 U/L (ref 15–41)
Albumin: 3.5 g/dL (ref 3.5–5.0)
Alkaline Phosphatase: 65 U/L (ref 38–126)
Anion gap: 13 (ref 5–15)
BUN: 27 mg/dL — ABNORMAL HIGH (ref 8–23)
CO2: 28 mmol/L (ref 22–32)
Calcium: 9.2 mg/dL (ref 8.9–10.3)
Chloride: 99 mmol/L (ref 98–111)
Creatinine, Ser: 1.82 mg/dL — ABNORMAL HIGH (ref 0.61–1.24)
GFR, Estimated: 42 mL/min — ABNORMAL LOW (ref >60)
Glucose, Bld: 164 mg/dL — ABNORMAL HIGH (ref 70–99)
Potassium: 4.3 mmol/L (ref 3.5–5.1)
Sodium: 140 mmol/L (ref 135–145)
Total Bilirubin: 0.8 mg/dL (ref 0.0–1.2)
Total Protein: 6 g/dL — ABNORMAL LOW (ref 6.5–8.1)

## 2024-03-26 LAB — BRAIN NATRIURETIC PEPTIDE: B Natriuretic Peptide: 326.3 pg/mL — ABNORMAL HIGH (ref 0.0–100.0)

## 2024-03-26 LAB — TSH: TSH: 0.743 u[IU]/mL (ref 0.350–4.500)

## 2024-03-26 MED ORDER — TORSEMIDE 20 MG PO TABS: ORAL_TABLET | ORAL | 1 refills | Status: DC

## 2024-03-26 NOTE — Patient Instructions (Signed)
 Medication Changes:  INCREASE TORSEMIDE  TO 60MG  TWICE DAILY FOR 2 DAYS   THEN START 60MG  IN THE MORNING AND 40MG  IN THE EVENING   Lab Work:  Labs done today, your results will be available in MyChart, we will contact you for abnormal readings.  THEN RETURN FOR LABS AS SCHEDULED IN 10 DAYS AS SCHEDULED   Testing/Procedures:  ECHOCARDIOGRAM AS SCHEDULED   Follow-Up in: 1 MONTH AS SCHEDULED WITH APP   At the [facility], you and your health needs are our priority. We have a designated team specialized in the treatment of Heart Failure. This Care Team includes your primary Heart Failure Specialized Cardiologist (physician), Advanced Practice Providers (APPs- Physician Assistants and Nurse Practitioners), and Pharmacist who all work together to provide you with the care you need, when you need it.   You may see any of the following providers on your designated Care Team at your next follow up:  Dr. Toribio Fuel Dr. Ezra Shuck Dr. Ria Commander Dr. Odis Brownie Greig Mosses, NP Caffie Shed, [place] [facility] [place], [place] Beckey Coe, NP [place] Lee, NP Tinnie Redman, PharmD   Please be sure to bring in all your medications bottles to every appointment.   Need to Contact Us :  If you have any questions or concerns before your next appointment please send us  a message through [place] or call our office at [phone].    TO LEAVE A MESSAGE FOR THE NURSE SELECT OPTION 2, PLEASE LEAVE A MESSAGE INCLUDING: YOUR NAME DATE OF BIRTH CALL BACK NUMBER REASON FOR CALL**this is important as we prioritize the call backs  YOU WILL RECEIVE A CALL BACK THE SAME DAY AS LONG AS YOU CALL BEFORE 4:00 PM

## 2024-03-26 NOTE — Progress Notes (Signed)
 ADVANCED HF CLINIC NOTE   Primary Care: Dr. Tanda Eke, MD HF Cardiology: Dr. Rolan EP: [place]  Chief complaint: VT  HPI: [name] [name] is a 63 y.o. male with a past medical history of chronic systolic CHF  (EF 15% in April 2018), dilated NICM s/p BiV ICD, PAF with intermittent compliance with Xarelto , ETOH abuse, tobacco abuse, OSA on CPAP, and HTN.   Admitted 12/25/16-12/27/16 with acute on chronic systolic CHF, volume overload. He was diuresed 11 pounds on IV lasix .  Discharge weight was 294 pounds. It was felt that the etiology of his NICM was ETOH vs hypertension related.   Admitted 5/14-5/16/18 with volume overload, acute respiratory failure requiring Bipap. He was not taking his Entresto  as he was confused about dosing and there was a mix up his pharmacy. He was diuresed with IV lasix , discharge weight was 295 pounds.   Admitted 05/30/17-06/02/17 with VT, ICD shock x 4. He is now on amiodarone  and sotalol .  Echo in 12/20 showed EF 20-25%, severe LV dilation, moderate LVH, normal RV, small IVC.   He was admitted in 3/21 with ICD shock in setting of hypokalemia and heavy ETOH. Entresto  was decreased with low BP.  He was diuresed due to volume overload.   He had left TKR in 11/21 with no complication.   Echo in 12/21 showed EF 20-25%, moderate LVH, normal RV, normal IVC.   Echo in 12/22 showed EF 20-25%, diffuse hypokinesis, mildly decreased RV systolic function.   Admitted 9/24 with VT. Was shocked by his ICD 9/13 and again 9/18. He was brought in for [facility] which showed well-compensated CHF and mild nonobstructive CAD. Suspected VT was 2/2 acute stressors (mom passed away and his brother had recently been hospitalized). Started Toprol  XL.   VT ablation with Dr. Sherrye in [place] in 4/25.  However, has continued to have VT after this.   RHC in 5/25 showed mildly elevated RA pressure and borderline elevated PCWP with preserved cardiac output.    Sotalol  was stopped and he was  started on quinidine, but he tolerated this poorly so quinidine was stopped and he went back on sotalol .   Today he returns for HF follow up. He has continued to have VT runs terminated by ATP but no ICD shocks.  He does not seem to feel the VT, no lightheadedness (feels occasional palpitations that may be VT => ATP).  He is short of breath walking about 100 yards, feels worse in the heat.  He has cut back on his prior very heavy caffeine use.  No orthopnea/PND.  No chest pain. Weight is up 9 lbs.   ECG (personally reviewed):A-BiV sequential pacing  Medtronic device interrogation: Thoracic impedance trending down though fluid index still < threshold, 94% BiV pacing, no AF, 16 runs of VT have been terminated by ATP since last interrogation (no ICD shock)  Labs (9/18): K 3.8, creatinine 1.02 Labs (11/18): K 4.4, creatinine 1.19, TSH normal, LFTs normal Labs (11/05/18): K 4 Creatinine 1.05  Labs (12/20): K 4, creatinine 1.19 Labs (3/21): K 4.1, creatinine 1.05, hgb 10.8, LFTs normal, TSH normal Labs (11/21): K 4.7, creatinine 1.24, hgb 11.9 Labs (12/21): K 4, creatinine 1.05, TSH and LFTs normal Labs (5/22): K 4.8, creatinine 1.67 Labs (5/23): K 4.1, creatinine 1.32 Labs (10/23): K 4.9, creatinine 1.64 Labs (9/24): K 3.5, SCr 1.27 Labs (4/25): K 4.6, creatinine 1.55, hgb 11.2 Labs (5/25): TSH normal, ALT 46 Labs (6/25): K 5.4, creatinine 1.93  PMH: 1. Chronic systolic  CHF: Medtronic CRT-D.  Nonischemic cardiomyopathy. Possibly ETOH-related.  - LHC (2016): No CAD.  - Echo (2016): EF 20-25%, diffuse hypokinesis.  - Echo (4/18): EF 15%, normal RV size and systolic function.  - Echo (11/18): EF 20%, mildly dilated LV with mild LVH, normal RV size with mildly decreased RV systolic function.  - Echo (12/20): EF 20-25%, severe LV dilation, moderate LVH, normal RV, small IVC.  - Echo (12/21): EF 20-25%, moderate LVH, normal RV, normal IVC. - Echo (12/22): EF 20-25%, diffuse hypokinesis, mildly  decreased RV systolic function.  - PYP scan (4/23): grade 1, H/CL < 1.5 - LHC/RHC (9/24) showed well-compensated CHF and mild nonobstructive CAD - Echo (9/24): EF 20-25%, normal RV - RHC (5/25): mean RA 11, PA 31/18 mean 25, mean PCWP 15, CI 2.82 (Fick)/2.96 (Thermo), PAPi 1.2 2. Atrial fibrillation: Paroxysmal.   3. OSA: On CPAP.  4. ETOH abuse: Prior heavy ETOH.  5. H/o VT: VT ablation by Dr. Sherrye at [facility] in 4/25. 6. COPD: Prior smoker.  7. COVID-19 infection 7/22  ROS: All systems reviewed and negative except as per HPI.   Current Outpatient Medications  Medication Sig Dispense Refill   albuterol  (VENTOLIN  HFA) 108 (90 Base) MCG/ACT inhaler Inhale 1 puff into the lungs every 6 (six) hours as needed for wheezing or shortness of breath. 18 g 0   amiodarone  (PACERONE ) 200 MG tablet Take 1 tablet (200 mg total) by mouth 2 (two) times daily. 180 tablet 2   Ascorbic Acid (VITAMIN C PO) Take 1 tablet by mouth daily.     atorvastatin  (LIPITOR ) 80 MG tablet TAKE 1 TABLET EVERY EVENING 90 tablet 1   ibuprofen (ADVIL) 200 MG tablet Take 400 mg by mouth every 8 (eight) hours as needed (pain.).     metoprolol  succinate (TOPROL -XL) 50 MG 24 hr tablet Take 1 tablet (50 mg total) by mouth daily. 30 tablet 6   Multiple Vitamins-Minerals (MULTIVITAMIN WITH MINERALS) tablet Take 1 tablet by mouth daily.     potassium chloride  SA (KLOR-CON  M) 20 MEQ tablet Take 1 tablet (20 mEq total) by mouth daily. 90 tablet 3   sacubitril -valsartan  (ENTRESTO ) 49-51 MG Take 1 tablet by mouth 2 (two) times daily. 180 tablet 3   sotalol  (BETAPACE ) 80 MG tablet Take 1 tablet (80 mg total) by mouth 2 (two) times daily. 180 tablet 3   spironolactone  (ALDACTONE ) 25 MG tablet TAKE 1 TABLET EVERY DAY 90 tablet 3   XARELTO  20 MG TABS tablet TAKE 1 TABLET DAILY WITH SUPPER. 30 tablet 11   torsemide  (DEMADEX ) 20 MG tablet TAKE 60MG  (3) TABLETS TWICE DAILY FOR 2 DAYS- THEN GO TO 60MG  (3) TABLETS IN THE MORNING, AND 40MG  (2)  TABLETS IN THE EVENING 160 tablet 1   No current facility-administered medications for this encounter.   Allergies  Allergen Reactions   Sglt2 Inhibitors Other (See Comments)    Recurrent UTI    Social History   Socioeconomic History   Marital status: Single    Spouse name: Not on file   Number of children: 2   Years of education: Not on file   Highest education level: Not on file  Occupational History   Occupation: scrap metal   Occupation: disabled, odd jobs  Tobacco Use   Smoking status: Former    Current packs/day: 0.00    Average packs/day: 0.5 packs/day for 4.0 years (2.0 ttl pk-yrs)    Types: Cigarettes    Start date: 10/29/2001    Quit date:  10/29/2005    Years since quitting: 18.4   Smokeless tobacco: Never   Tobacco comments:    about a pack a week. social  Vaping Use   Vaping status: Never Used  Substance and Sexual Activity   Alcohol use: Yes    Comment: 12 beers per week    Drug use: Yes    Types: Marijuana   Sexual activity: Yes    Partners: Female  Other Topics Concern   Not on file  Social History Narrative   ** Merged History Encounter **       Social Drivers of Health   Financial Resource Strain: Low Risk  (10/04/2023)   Overall Financial Resource Strain (CARDIA)    Difficulty of Paying Living Expenses: Not hard at all  Food Insecurity: No Food Insecurity (10/04/2023)   Hunger Vital Sign    Worried About Running Out of Food in the Last Year: Never true    Ran Out of Food in the Last Year: Never true  Transportation Needs: No Transportation Needs (10/04/2023)   PRAPARE - Administrator, Civil Service (Medical): No    Lack of Transportation (Non-Medical): No  Physical Activity: Insufficiently Active (10/04/2023)   Exercise Vital Sign    Days of Exercise per Week: 3 days    Minutes of Exercise per Session: 30 min  Stress: No Stress Concern Present (10/04/2023)   [employer] of Occupational Health - Occupational Stress  Questionnaire    Feeling of Stress : Not at all  Social Connections: Socially Isolated (10/04/2023)   Social Connection and Isolation Panel    Frequency of Communication with Friends and Family: Three times a week    Frequency of Social Gatherings with Friends and Family: Twice a week    Attends Religious Services: Never    Database administrator or Organizations: No    Attends Banker Meetings: Never    Marital Status: Divorced  Catering manager Violence: Not At Risk (10/04/2023)   Humiliation, Afraid, Rape, and Kick questionnaire    Fear of Current or Ex-Partner: No    Emotionally Abused: No    Physically Abused: No    Sexually Abused: No   Family History  Problem Relation Age of Onset   Other Mother        cardiac surgery. late 1990s   Heart Problems Mother        CABG AGE 83   Congestive Heart Failure Father    Healthy Father        AGE 11   Coronary artery disease Other    Healthy Brother        AGE 81   Healthy Sister        AGE 69   Healthy Sister        AGE 69   Healthy Son    Healthy Son    Healthy Daughter    BP 100/60   Pulse 73   Wt (!) 152.9 kg (337 lb)   SpO2 93%   BMI 39.96 kg/m   Wt Readings from Last 3 Encounters:  03/26/24 (!) 152.9 kg (337 lb)  02/21/24 (!) 151.3 kg (333 lb 8 oz)  01/31/24 (!) 147.4 kg (325 lb)    PHYSICAL EXAM: General: NAD Neck: JVP 8-9 cm, no thyromegaly or thyroid  nodule.  Lungs: Clear to auscultation bilaterally with normal respiratory effort. CV: Nondisplaced PMI.  Heart regular S1/S2, no S3/S4, no murmur.  1+ edema 1/2 to knees bilaterally.  No carotid  bruit.  Normal pedal pulses.  Abdomen: Soft, nontender, no hepatosplenomegaly, no distention.  Skin: Intact without lesions or rashes.  Neurologic: Alert and oriented x 3.  Psych: Normal affect. Extremities: No clubbing or cyanosis.  HEENT: Normal.   ASSESSMENT & PLAN: 1. Chronic systolic CHF: NICM, Echo 12/2016 EF 15%. Minimal CAD on cath in July 2016  and again in 9/24. Medtronic CRT-D device. ETOH vs HTN vs myocarditis as cause of cardiomyopathy. Echo 12/22 and again in 9/24 showed that EF remains 20-25%.  PYP scan not suggestive of TTR cardiac amyloidosis.  RHC 5/25 showed R>L heart failure with preserved cardiac output by Fick and thermo but low PAPi.  NYHA class II-III symptoms, he is volume overloaded by exam and Optivol, and weight is up.   Main problem continues to be very frequent NSVT and VT.   - Increase torsemide  to 60 mg bid x 2 days then 60 qam/40 qpm after that.  BMET/BNP today, BMET in 10 days.  - Continue Entresto  49/51 mg bid. - Continue spironolactone  25 mg daily.  - Continue Toprol  XL 50 mg daily.   - He stopped Jardiance  after 3 UTIs, would not start back.  - Continue to keep ETOH minimal.    - If we are unable to get ventricular arrhythmias under control, he may be a candidate for a heart transplant. Cardiac output was not low on RHC in 5/25.  2. Paroxysmal atrial fibrillation: Maintaining SR .  - Continue amiodarone  200 mg bid per EP.  Check LFTs (ALT has been mildly elevated) and TSH today.  Should have regular eye exam.  - Continue Xarelto  20 mg daily.   3. ETOH abuse: Suspect this contributes to cardiomyopathy.  He has cut back but still drinks moderately, asked him to cut back further.   4. Tobacco abuse: No longer smoking but does smoke marijuana on some days 5. VT: Had VT ablation at [facility] in 4/25 but still with frequent NSVT and VT.  16 episodes of ATP since last interrogation though he rarely feels palpitations.  He has not had syncope/presyncope.  - Continue amiodarone  200 mg bid, check LFTs and TSH today.  Needs regular eye exam.  - Continue sotalol .  - He did not tolerate quinidine.  - Continue Toprol  XL 50 mg daily. - He has been referred to EP at [facility] for additional evaluation.  6. OSA: Use BiPAP.   Followup in 1 month with APP.   I spent 32 minutes reviewing records, interviewing/examining patient, and  managing orders.    [name] Shuck, MD 03/26/24

## 2024-04-03 ENCOUNTER — Encounter: Payer: Self-pay | Admitting: Internal Medicine

## 2024-04-04 ENCOUNTER — Telehealth: Payer: Self-pay

## 2024-04-04 NOTE — Telephone Encounter (Addendum)
 Alert received from [employer]:  Unscheduled remote transmission:  PII, no EMR note Normal device function.  10 VT events 7/11 & 7/12,  HR's 140-182, V>A, pace terminated with 1-2 bursts of ATP --15 NSVT events  Outreach made to Pt.  Pt states he sent a transmission because his bedside monitor was lighting up and it was bothering him.  Pt denies any symptoms regarding transmission.  Pt with known VT treated with ATP.   Close follow up scheduled with Heart Failure.

## 2024-04-07 ENCOUNTER — Ambulatory Visit (HOSPITAL_COMMUNITY)
Admission: RE | Admit: 2024-04-07 | Discharge: 2024-04-07 | Disposition: A | Discharge status: Home / Self Care | Source: Ambulatory Visit | Attending: Cardiology | Admitting: Cardiology

## 2024-04-07 ENCOUNTER — Ambulatory Visit (INDEPENDENT_AMBULATORY_CARE_PROVIDER_SITE_OTHER)

## 2024-04-07 DIAGNOSIS — Z95810 Presence of automatic (implantable) cardiac defibrillator: Secondary | ICD-10-CM | POA: Diagnosis not present

## 2024-04-07 DIAGNOSIS — I5022 Chronic systolic (congestive) heart failure: Secondary | ICD-10-CM

## 2024-04-10 ENCOUNTER — Telehealth: Payer: Self-pay

## 2024-04-10 NOTE — Progress Notes (Signed)
 EPIC Encounter for ICM Monitoring  Patient Name: [name] [name] is a 63 y.o. male Date: 04/10/2024 Primary Care Physican: Lorren Greig PARAS, NP Primary Cardiologist: Rolan Electrophysiologist: Mealor BiV Pacing: 98.2% 03/21/2023 Weight: 320 lbs 07/06/2023 Weight: 320 lbs 11/29/2023 Weight: 323 lbs 01/29/2024 Office Weight: 327 lbs 03/26/2024 Office Weight: 337 lbs   Since 04-Apr-2024 Monitored VT-NS (>4 beats, >150 bpm)   36    VT/VT Pace-Terminated Episodes  2 of 2    Spoke with patient and heart failure questions reviewed.  Transmission results reviewed.  Pt asymptomatic for fluid accumulation.  Reports feeling well at this time and voices no complaints.      Optivol Thoracic impedance suggesting normal fluid levels within the last month.   Message sent to device clinic on 7/21 regarding ATP.    Prescribed:  Torsemide  20 mg 3 tablets (60 mg total) by mouth every morning and 2 tablets (40 mg total) every evening Potassium 20 mEq take 1 tablet by mouth daily Spironolactone  25 mg take 1 tablet daily.   Labs: 03/26/2024 Creatinine 1.82, BUN 27, Potassium 4.3, Sodium 140  02/25/2024 Creatinine 1.93, BUN 38, Potassium 5.4, Sodium 140, GFR 39  02/21/2024 Creatinine 1.85, BUN 38, Potassium 5.0, Sodium 142, GFR 41  02/15/2024 Creatinine 1.90, BUN 37, Potassium 4.7, Sodium 140 01/22/2024 Creatinine 1.88, BUN 26, Potassium 4.4, Sodium 140, GFR 40 A complete set of results can be found in Results Review.   Recommendations:  No changes and encouraged to call if experiencing any fluid symptoms.   Follow-up plan: ICM clinic phone appointment on 05/12/2024.   91 day device clinic remote transmission 05/21/2024.     EP/Cardiology Office Visits:  06/06/2024 with Dr Nancey.   04/28/2024 with HF clinic.   Copy of ICM check sent to Dr. Nancey.   3 month ICM trend: 04/07/2024.    12-14 Month ICM trend:     Mitzie GORMAN Garner, RN 04/10/2024 10:54 AM

## 2024-04-10 NOTE — Progress Notes (Signed)
 [name] [name] LABOR, RN  [name] [name], [name] [name] RAMAN, RN I don't think so.  If you see he got a shock be sure to let us  know.  But the ATP is ongoing.  HF is aware.  Referring him to [facility].   [name]

## 2024-04-10 NOTE — Telephone Encounter (Signed)
 Remote ICM transmission received.  Attempted call to patient regarding ICM remote transmission and he returned call

## 2024-04-14 NOTE — Progress Notes (Signed)
 Remote ICD transmission.

## 2024-04-18 ENCOUNTER — Ambulatory Visit (HOSPITAL_COMMUNITY)
Admission: RE | Admit: 2024-04-18 | Discharge: 2024-04-18 | Disposition: A | Discharge status: Home / Self Care | Source: Ambulatory Visit | Attending: Cardiology | Admitting: Cardiology

## 2024-04-18 DIAGNOSIS — I77810 Thoracic aortic ectasia: Secondary | ICD-10-CM | POA: Diagnosis not present

## 2024-04-18 DIAGNOSIS — I110 Hypertensive heart disease with heart failure: Secondary | ICD-10-CM | POA: Diagnosis not present

## 2024-04-18 DIAGNOSIS — I340 Nonrheumatic mitral (valve) insufficiency: Secondary | ICD-10-CM | POA: Insufficient documentation

## 2024-04-18 DIAGNOSIS — Z95810 Presence of automatic (implantable) cardiac defibrillator: Secondary | ICD-10-CM | POA: Insufficient documentation

## 2024-04-18 DIAGNOSIS — I2510 Atherosclerotic heart disease of native coronary artery without angina pectoris: Secondary | ICD-10-CM | POA: Diagnosis not present

## 2024-04-18 DIAGNOSIS — I5022 Chronic systolic (congestive) heart failure: Secondary | ICD-10-CM | POA: Insufficient documentation

## 2024-04-18 LAB — ECHOCARDIOGRAM COMPLETE
AR max vel: 5.75 cm2
AV Area VTI: 5.61 cm2
AV Area mean vel: 5.42 cm2
AV Mean grad: 4 mmHg
AV Peak grad: 7.2 mmHg
Ao pk vel: 1.34 m/s
Calc EF: 32.7 %
S' Lateral: 6.3 cm
Single Plane A2C EF: 29.5 %
Single Plane A4C EF: 35.4 %

## 2024-04-18 MED ORDER — PERFLUTREN LIPID MICROSPHERE
1.0000 mL | INTRAVENOUS | Status: AC | PRN
  Administered 2024-04-18: 5 mL via INTRAVENOUS

## 2024-04-25 ENCOUNTER — Telehealth (HOSPITAL_COMMUNITY): Payer: Self-pay

## 2024-04-25 NOTE — Telephone Encounter (Signed)
 Called to confirm/remind patient of their appointment at [facility] on 04/28/24.   Appointment:   [x] Confirmed  [] Left mess   [] No answer/No voice mail  [] VM Full/unable to leave message  [] Phone not in service  Patient reminded to bring all medications and/or complete list.  Confirmed patient has transportation. Gave directions, instructed to utilize valet parking.

## 2024-04-25 NOTE — Progress Notes (Signed)
 ADVANCED HF CLINIC NOTE   PCP: Lorren Greig PARAS, NP EP: Dr. Nancey HF Cardiology: Dr. Rolan  HPI:  Mr. Depuy is a 63 y.o. male with a past medical history of chronic systolic CHF  (EF 15% in April 2018), dilated NICM s/p BiV ICD, PAF with intermittent compliance with Xarelto , ETOH abuse, tobacco abuse, OSA on CPAP, and HTN.   Admitted 12/25/16-12/27/16 with acute on chronic systolic CHF, volume overload. He was diuresed 11 pounds on IV lasix .  Discharge weight was 294 pounds. It was felt that the etiology of his NICM was ETOH vs hypertension related.   Admitted 5/14-5/16/18 with volume overload, acute respiratory failure requiring Bipap. He was not taking his Entresto  as he was confused about dosing and there was a mix up his pharmacy. He was diuresed with IV lasix , discharge weight was 295 pounds.   Admitted 05/30/17-06/02/17 with VT, ICD shock x 4. He is now on amiodarone  and sotalol .  Echo in 12/20 showed EF 20-25%, severe LV dilation, moderate LVH, normal RV, small IVC.   He was admitted in 3/21 with ICD shock in setting of hypokalemia and heavy ETOH. Entresto  was decreased with low BP.  He was diuresed due to volume overload.   He had left TKR in 11/21 with no complication.   Echo in 12/21 showed EF 20-25%, moderate LVH, normal RV, normal IVC.   Echo in 12/22 showed EF 20-25%, diffuse hypokinesis, mildly decreased RV systolic function.   Admitted 9/24 with VT. Was shocked by his ICD 9/13 and again 9/18. He was brought in for [facility] which showed well-compensated CHF and mild nonobstructive CAD. Suspected VT was 2/2 acute stressors (mom passed away and his brother had recently been hospitalized). Started Toprol  XL.   VT ablation with Dr. Sherrye in [place] in 4/25.  However, has continued to have VT after this.   RHC in 5/25 showed mildly elevated RA pressure and borderline elevated PCWP with preserved cardiac output.    Sotalol  was stopped and he was started on quinidine, but he  tolerated this poorly so quinidine was stopped and he went back on sotalol .   Today he returns for HF follow up. Overall feeling fine. He has SOB walking further distances on flat ground, > 100 yards. Occasional positional dizziness, no falls. Occasional ankle swelling. Denies palpitations, abnormal bleeding, CP, or PND/Orthopnea. Appetite ok. Weight at home 334 pounds. Taking all medications. He has continues to have VT runs terminated by ATP but no ICD shocks.  He does not seem to feel the VT. Wears BiPAP. Occasional THC, drinks ETOH on weekends.  ECG (personally reviewed): none ordered today.  Medtronic device interrogation (personally reviewed): Stable thoracic impedence, no AT/AF, 2.3 hr/day activity, 41 pace-terminated episodes, no ICD shocks, 98.2% VP  Labs (9/24): K 3.5, creatinine 1.27 Labs (4/25): K 4.6, creatinine 1.55, hgb 11.2 Labs (5/25): TSH normal, ALT 46 Labs (6/25): K 5.4, creatinine 1.93 Labs (7/25): K 4.3, creatinine 1.82, normal LFTs, normal TSH  PMH: 1. Chronic systolic CHF: Medtronic CRT-D.  Nonischemic cardiomyopathy. Possibly ETOH-related.  - LHC (2016): No CAD.  - Echo (2016): EF 20-25%, diffuse hypokinesis.  - Echo (4/18): EF 15%, normal RV size and systolic function.  - Echo (11/18): EF 20%, mildly dilated LV with mild LVH, normal RV size with mildly decreased RV systolic function.  - Echo (12/20): EF 20-25%, severe LV dilation, moderate LVH, normal RV, small IVC.  - Echo (12/21): EF 20-25%, moderate LVH, normal RV, normal IVC. - Echo (  12/22): EF 20-25%, diffuse hypokinesis, mildly decreased RV systolic function.  - PYP scan (4/23): grade 1, H/CL < 1.5 - LHC/RHC (9/24) showed well-compensated CHF and mild nonobstructive CAD - Echo (9/24): EF 20-25%, normal RV - RHC (5/25): mean RA 11, PA 31/18 mean 25, mean PCWP 15, CI 2.82 (Fick)/2.96 (Thermo), PAPi 1.2 2. Atrial fibrillation: Paroxysmal.   3. OSA: On CPAP.  4. ETOH abuse: Prior heavy ETOH.  5. H/o VT: VT  ablation by Dr. Sherrye at [facility] in 4/25. 6. COPD: Prior smoker.  7. COVID-19 infection 7/22  ROS: All systems reviewed and negative except as per HPI.   Current Outpatient Medications  Medication Sig Dispense Refill   albuterol  (VENTOLIN  HFA) 108 (90 Base) MCG/ACT inhaler Inhale 1 puff into the lungs every 6 (six) hours as needed for wheezing or shortness of breath. 18 g 0   amiodarone  (PACERONE ) 200 MG tablet Take 1 tablet (200 mg total) by mouth 2 (two) times daily. 180 tablet 2   Ascorbic Acid (VITAMIN C PO) Take 1 tablet by mouth daily.     atorvastatin  (LIPITOR ) 80 MG tablet TAKE 1 TABLET EVERY EVENING 90 tablet 1   ibuprofen (ADVIL) 200 MG tablet Take 400 mg by mouth every 8 (eight) hours as needed (pain.).     metoprolol  succinate (TOPROL -XL) 50 MG 24 hr tablet Take 1 tablet (50 mg total) by mouth daily. 30 tablet 6   Multiple Vitamins-Minerals (MULTIVITAMIN WITH MINERALS) tablet Take 1 tablet by mouth daily.     potassium chloride  SA (KLOR-CON  M) 20 MEQ tablet Take 1 tablet (20 mEq total) by mouth daily. 90 tablet 3   sacubitril -valsartan  (ENTRESTO ) 49-51 MG Take 1 tablet by mouth 2 (two) times daily. 180 tablet 3   sotalol  (BETAPACE ) 80 MG tablet Take 1 tablet (80 mg total) by mouth 2 (two) times daily. 180 tablet 3   spironolactone  (ALDACTONE ) 25 MG tablet TAKE 1 TABLET EVERY DAY 90 tablet 3   torsemide  (DEMADEX ) 20 MG tablet TAKE  60MG  (3) TABLETS IN THE MORNING, AND 40MG  (2) TABLETS IN THE EVENING,     XARELTO  20 MG TABS tablet TAKE 1 TABLET DAILY WITH SUPPER. 30 tablet 11   No current facility-administered medications for this encounter.   Allergies  Allergen Reactions   Sglt2 Inhibitors Other (See Comments)    Recurrent UTI    Social History   Socioeconomic History   Marital status: Single    Spouse name: Not on file   Number of children: 2   Years of education: Not on file   Highest education level: Not on file  Occupational History   Occupation: scrap metal    Occupation: disabled, odd jobs  Tobacco Use   Smoking status: Former    Current packs/day: 0.00    Average packs/day: 0.5 packs/day for 4.0 years (2.0 ttl pk-yrs)    Types: Cigarettes    Start date: 10/29/2001    Quit date: 10/29/2005    Years since quitting: 18.5   Smokeless tobacco: Never   Tobacco comments:    about a pack a week. social  Vaping Use   Vaping status: Never Used  Substance and Sexual Activity   Alcohol use: Yes    Comment: 12 beers per week    Drug use: Yes    Types: Marijuana   Sexual activity: Yes    Partners: Female  Other Topics Concern   Not on file  Social History Narrative   ** Merged History Encounter **  Social Drivers of Corporate investment banker Strain: Low Risk  (10/04/2023)   Overall Financial Resource Strain (CARDIA)    Difficulty of Paying Living Expenses: Not hard at all  Food Insecurity: No Food Insecurity (10/04/2023)   Hunger Vital Sign    Worried About Running Out of Food in the Last Year: Never true    Ran Out of Food in the Last Year: Never true  Transportation Needs: No Transportation Needs (10/04/2023)   PRAPARE - Administrator, Civil Service (Medical): No    Lack of Transportation (Non-Medical): No  Physical Activity: Insufficiently Active (10/04/2023)   Exercise Vital Sign    Days of Exercise per Week: 3 days    Minutes of Exercise per Session: 30 min  Stress: No Stress Concern Present (10/04/2023)   [employer] of Occupational Health - Occupational Stress Questionnaire    Feeling of Stress : Not at all  Social Connections: Socially Isolated (10/04/2023)   Social Connection and Isolation Panel    Frequency of Communication with Friends and Family: Three times a week    Frequency of Social Gatherings with Friends and Family: Twice a week    Attends Religious Services: Never    Database administrator or Organizations: No    Attends Banker Meetings: Never    Marital Status: Divorced   Catering manager Violence: Not At Risk (10/04/2023)   Humiliation, Afraid, Rape, and Kick questionnaire    Fear of Current or Ex-Partner: No    Emotionally Abused: No    Physically Abused: No    Sexually Abused: No   Family History  Problem Relation Age of Onset   Other Mother        cardiac surgery. late 1990s   Heart Problems Mother        CABG AGE 54   Congestive Heart Failure Father    Healthy Father        AGE 44   Coronary artery disease Other    Healthy Brother        AGE 44   Healthy Sister        AGE 70   Healthy Sister        AGE 31   Healthy Son    Healthy Son    Healthy Daughter    BP 101/72   Pulse 82   Ht 6' 5 (1.956 m)   Wt (!) 153 kg (337 lb 3.2 oz)   SpO2 99%   BMI 39.99 kg/m   Wt Readings from Last 3 Encounters:  04/28/24 (!) 153 kg (337 lb 3.2 oz)  03/26/24 (!) 152.9 kg (337 lb)  02/21/24 (!) 151.3 kg (333 lb 8 oz)    PHYSICAL EXAM: General:  NAD. No resp difficulty, walked into clinic HEENT: Normal Neck: Supple. No JVD. Thick neck Cor: Regular rate & rhythm. No rubs, gallops or murmurs. Lungs: Clear Abdomen: Soft, obese, nontender, nondistended.  Extremities: No cyanosis, clubbing, rash, trace L ankle edema Neuro: Alert & oriented x 3, moves all 4 extremities w/o difficulty. Affect pleasant.  ASSESSMENT & PLAN: 1. Chronic systolic CHF: NICM, Echo 12/2016 EF 15%. Minimal CAD on cath in July 2016 and again in 9/24. Medtronic CRT-D device. ETOH vs HTN vs myocarditis as cause of cardiomyopathy. Echo 12/22 and again in 9/24 showed that EF remains 20-25%.  PYP scan not suggestive of TTR cardiac amyloidosis.  RHC 5/25 showed R>L heart failure with preserved cardiac output by Fick and thermo but low  PAPi.  NYHA class II-III symptoms, he is not volume overloaded on exam or by OptiVol.  Main problem continues to be very frequent NSVT and VT.   - Continue torsemide  60 qam/40 qpm + 20 KCL daily. BMET/BNP today  - Continue Entresto  49/51 mg bid. -  Continue spironolactone  25 mg daily.  - Continue Toprol  XL 50 mg daily.   - He stopped Jardiance  after 3 UTIs, would not start back.  - Continue to keep ETOH minimal.    - If we are unable to get ventricular arrhythmias under control, he may be a candidate for a heart transplant. Cardiac output was not low on RHC in 5/25.  2. Paroxysmal atrial fibrillation: Maintaining SR on device interrogation.  - Continue amiodarone  200 mg bid per EP.  Amio labs UTD. Should have regular eye exam.  - Continue Xarelto  20 mg daily.  Recent CBC stable 3. ETOH abuse: Suspect this contributes to cardiomyopathy.  He has cut back but still drinks moderately, asked him to cut back further.   4. Tobacco abuse: No longer smoking but does smoke marijuana on some days 5. VT: Had VT ablation at [facility] in 4/25 but still with frequent NSVT and VT. 41 episodes of ATP since last interrogation though he rarely feels palpitations.  He has not had syncope/presyncope.  - Continue amiodarone  200 mg bid. Needs regular eye exam.  - Continue sotalol .  - Continue Toprol  XL 50 mg daily. - He did not tolerate quinidine.  - He has been referred to EP at [facility] for additional evaluation, 06/20/24.  6. OSA: Use BiPAP.   Follow up in 3-4 months with Dr. Rolan Harlene CHRISTELLA Glena, FNP 04/28/24

## 2024-04-28 ENCOUNTER — Encounter (HOSPITAL_COMMUNITY): Payer: Self-pay

## 2024-04-28 ENCOUNTER — Ambulatory Visit (HOSPITAL_COMMUNITY)
Admission: RE | Admit: 2024-04-28 | Discharge: 2024-04-28 | Disposition: A | Discharge status: Home / Self Care | Source: Ambulatory Visit | Attending: Family Medicine

## 2024-04-28 VITALS — BP 101/72 | HR 82 | Ht 77.0 in | Wt 337.2 lb

## 2024-04-28 DIAGNOSIS — Z7901 Long term (current) use of anticoagulants: Secondary | ICD-10-CM | POA: Diagnosis not present

## 2024-04-28 DIAGNOSIS — I110 Hypertensive heart disease with heart failure: Secondary | ICD-10-CM | POA: Insufficient documentation

## 2024-04-28 DIAGNOSIS — Z87891 Personal history of nicotine dependence: Secondary | ICD-10-CM | POA: Insufficient documentation

## 2024-04-28 DIAGNOSIS — F1010 Alcohol abuse, uncomplicated: Secondary | ICD-10-CM | POA: Diagnosis not present

## 2024-04-28 DIAGNOSIS — Z7984 Long term (current) use of oral hypoglycemic drugs: Secondary | ICD-10-CM | POA: Insufficient documentation

## 2024-04-28 DIAGNOSIS — Z79899 Other long term (current) drug therapy: Secondary | ICD-10-CM | POA: Insufficient documentation

## 2024-04-28 DIAGNOSIS — I480 Paroxysmal atrial fibrillation: Secondary | ICD-10-CM | POA: Diagnosis not present

## 2024-04-28 DIAGNOSIS — I2510 Atherosclerotic heart disease of native coronary artery without angina pectoris: Secondary | ICD-10-CM | POA: Diagnosis not present

## 2024-04-28 DIAGNOSIS — G4733 Obstructive sleep apnea (adult) (pediatric): Secondary | ICD-10-CM | POA: Diagnosis not present

## 2024-04-28 DIAGNOSIS — I5022 Chronic systolic (congestive) heart failure: Secondary | ICD-10-CM | POA: Insufficient documentation

## 2024-04-28 DIAGNOSIS — I472 Ventricular tachycardia, unspecified: Secondary | ICD-10-CM | POA: Diagnosis not present

## 2024-04-28 DIAGNOSIS — I428 Other cardiomyopathies: Secondary | ICD-10-CM | POA: Insufficient documentation

## 2024-04-28 LAB — BASIC METABOLIC PANEL WITH GFR
Anion gap: 10 (ref 5–15)
BUN: 53 mg/dL — ABNORMAL HIGH (ref 8–23)
CO2: 25 mmol/L (ref 22–32)
Calcium: 9.4 mg/dL (ref 8.9–10.3)
Chloride: 102 mmol/L (ref 98–111)
Creatinine, Ser: 2.26 mg/dL — ABNORMAL HIGH (ref 0.61–1.24)
GFR, Estimated: 32 mL/min — ABNORMAL LOW (ref >60)
Glucose, Bld: 107 mg/dL — ABNORMAL HIGH (ref 70–99)
Potassium: 4.8 mmol/L (ref 3.5–5.1)
Sodium: 137 mmol/L (ref 135–145)

## 2024-04-28 LAB — BRAIN NATRIURETIC PEPTIDE: B Natriuretic Peptide: 611.9 pg/mL — ABNORMAL HIGH (ref 0.0–100.0)

## 2024-04-28 MED ORDER — TORSEMIDE 20 MG PO TABS: ORAL_TABLET | ORAL | Status: DC

## 2024-04-28 NOTE — Patient Instructions (Signed)
 No change in medications. Labs today - will call you if abnormal.  Return to see Dr. Rolan in 4 months.  CALL [phone] IN NOVEMBER TO SCHEDULE THIS APPOINTMENT. Please call us  at [phone] if any questions or concerns prior to your next appointment.

## 2024-04-29 ENCOUNTER — Ambulatory Visit (HOSPITAL_COMMUNITY): Payer: Self-pay | Admitting: Family Medicine

## 2024-05-12 ENCOUNTER — Ambulatory Visit (INDEPENDENT_AMBULATORY_CARE_PROVIDER_SITE_OTHER)

## 2024-05-12 ENCOUNTER — Ambulatory Visit (HOSPITAL_COMMUNITY)
Admission: RE | Admit: 2024-05-12 | Discharge: 2024-05-12 | Disposition: A | Discharge status: Home / Self Care | Source: Ambulatory Visit | Attending: Cardiology

## 2024-05-12 DIAGNOSIS — Z95810 Presence of automatic (implantable) cardiac defibrillator: Secondary | ICD-10-CM | POA: Diagnosis not present

## 2024-05-12 DIAGNOSIS — I5022 Chronic systolic (congestive) heart failure: Secondary | ICD-10-CM | POA: Insufficient documentation

## 2024-05-12 LAB — BASIC METABOLIC PANEL WITH GFR
Anion gap: 8 (ref 5–15)
BUN: 54 mg/dL — ABNORMAL HIGH (ref 8–23)
CO2: 25 mmol/L (ref 22–32)
Calcium: 9.3 mg/dL (ref 8.9–10.3)
Chloride: 106 mmol/L (ref 98–111)
Creatinine, Ser: 2.32 mg/dL — ABNORMAL HIGH (ref 0.61–1.24)
GFR, Estimated: 31 mL/min — ABNORMAL LOW (ref >60)
Glucose, Bld: 123 mg/dL — ABNORMAL HIGH (ref 70–99)
Potassium: 4.8 mmol/L (ref 3.5–5.1)
Sodium: 139 mmol/L (ref 135–145)

## 2024-05-13 ENCOUNTER — Other Ambulatory Visit (HOSPITAL_COMMUNITY)

## 2024-05-13 ENCOUNTER — Other Ambulatory Visit (HOSPITAL_COMMUNITY): Payer: Self-pay

## 2024-05-13 DIAGNOSIS — I5022 Chronic systolic (congestive) heart failure: Secondary | ICD-10-CM

## 2024-05-13 MED ORDER — TORSEMIDE 20 MG PO TABS: 40.0000 mg | ORAL_TABLET | Freq: Two times a day (BID) | ORAL | Status: DC

## 2024-05-15 ENCOUNTER — Telehealth: Payer: Self-pay

## 2024-05-15 NOTE — Progress Notes (Signed)
 EPIC Encounter for ICM Monitoring  Patient Name: [name] [name] is a 63 y.o. male Date: 05/15/2024 Primary Care Physican: Lorren Greig PARAS, NP Primary Cardiologist: Rolan Electrophysiologist: Mealor BiV Pacing: 98.6% 03/21/2023 Weight: 320 lbs 07/06/2023 Weight: 320 lbs 11/29/2023 Weight: 323 lbs 01/29/2024 Office Weight: 327 lbs 03/26/2024 Office Weight: 337 lbs 04/28/2024 Office Weight:  338 lbs   Since 28-Apr-2024 Monitored VT-NS (>4 beats, >150 bpm)   37    VT/VT Pace-Terminated Episodes  1 of 1    Attempted call to patient and unable to reach.    Transmission results reviewed.     Optivol Thoracic impedance suggesting normal fluid levels within the last month.   Per 04/28/2024 HF clinic note, aware of ongoing ATP for VT episodes.     Prescribed:  Torsemide  20 mg 3 tablets (60 mg total) by mouth every morning and 2 tablets (40 mg total) every evening Potassium 20 mEq take 1 tablet by mouth daily Spironolactone  25 mg take 1 tablet daily.   Labs: 05/22/2024 BMET scheduled with HF clinic.   03/26/2024 Creatinine 1.82, BUN 27, Potassium 4.3, Sodium 140  02/25/2024 Creatinine 1.93, BUN 38, Potassium 5.4, Sodium 140, GFR 39  02/21/2024 Creatinine 1.85, BUN 38, Potassium 5.0, Sodium 142, GFR 41  02/15/2024 Creatinine 1.90, BUN 37, Potassium 4.7, Sodium 140 01/22/2024 Creatinine 1.88, BUN 26, Potassium 4.4, Sodium 140, GFR 40 A complete set of results can be found in Results Review.   Recommendations:  Unable to reach.     Follow-up plan: ICM clinic phone appointment on 06/16/2024.   91 day device clinic remote transmission 05/21/2024.     EP/Cardiology Office Visits:  06/06/2024 with Dr Nancey.   Recall 08/26/2024 with Dr Rolan.   Copy of ICM check sent to Dr. Nancey.  3 month ICM trend: 05/12/2024.    12-14 Month ICM trend:     Mitzie GORMAN Garner, RN 05/15/2024 9:55 AM

## 2024-05-15 NOTE — Telephone Encounter (Signed)
Remote ICM transmission received.  Attempted call to patient regarding ICM remote transmission and no answer.  Mail box is full. 

## 2024-05-21 ENCOUNTER — Ambulatory Visit (INDEPENDENT_AMBULATORY_CARE_PROVIDER_SITE_OTHER): Payer: Medicare HMO

## 2024-05-21 DIAGNOSIS — I5022 Chronic systolic (congestive) heart failure: Secondary | ICD-10-CM | POA: Diagnosis not present

## 2024-05-22 ENCOUNTER — Ambulatory Visit (HOSPITAL_COMMUNITY): Payer: Self-pay | Admitting: Cardiology

## 2024-05-22 ENCOUNTER — Ambulatory Visit (HOSPITAL_COMMUNITY)
Admission: RE | Admit: 2024-05-22 | Discharge: 2024-05-22 | Disposition: A | Discharge status: Home / Self Care | Source: Ambulatory Visit | Attending: Cardiology | Admitting: Cardiology

## 2024-05-22 ENCOUNTER — Ambulatory Visit: Payer: Self-pay | Admitting: Cardiovascular Disease

## 2024-05-22 DIAGNOSIS — I5022 Chronic systolic (congestive) heart failure: Secondary | ICD-10-CM | POA: Insufficient documentation

## 2024-05-22 LAB — CUP PACEART REMOTE DEVICE CHECK
Battery Remaining Longevity: 53 mo
Battery Voltage: 2.96 V
Brady Statistic AP VP Percent: 99.69 %
Brady Statistic AP VS Percent: 0.28 %
Brady Statistic AS VP Percent: 0.02 %
Brady Statistic AS VS Percent: 0.01 %
Brady Statistic RA Percent Paced: 99.93 %
Brady Statistic RV Percent Paced: 99.66 %
Date Time Interrogation Session: 20250903044224
HighPow Impedance: 64 Ohm
Implantable Lead Connection Status: 753985
Implantable Lead Connection Status: 753985
Implantable Lead Connection Status: 753985
Implantable Lead Implant Date: 20160727
Implantable Lead Implant Date: 20160727
Implantable Lead Implant Date: 20160727
Implantable Lead Location: 753858
Implantable Lead Location: 753859
Implantable Lead Location: 753860
Implantable Lead Model: 4598
Implantable Lead Model: 5076
Implantable Lead Model: 6935
Implantable Pulse Generator Implant Date: 20230605
Lead Channel Impedance Value: 175.622
Lead Channel Impedance Value: 175.622
Lead Channel Impedance Value: 180.5 Ohm
Lead Channel Impedance Value: 189.525
Lead Channel Impedance Value: 189.525
Lead Channel Impedance Value: 342 Ohm
Lead Channel Impedance Value: 361 Ohm
Lead Channel Impedance Value: 361 Ohm
Lead Channel Impedance Value: 361 Ohm
Lead Channel Impedance Value: 399 Ohm
Lead Channel Impedance Value: 418 Ohm
Lead Channel Impedance Value: 418 Ohm
Lead Channel Impedance Value: 456 Ohm
Lead Channel Impedance Value: 589 Ohm
Lead Channel Impedance Value: 589 Ohm
Lead Channel Impedance Value: 646 Ohm
Lead Channel Impedance Value: 646 Ohm
Lead Channel Impedance Value: 646 Ohm
Lead Channel Pacing Threshold Amplitude: 0.5 V
Lead Channel Pacing Threshold Amplitude: 0.875 V
Lead Channel Pacing Threshold Amplitude: 1.25 V
Lead Channel Pacing Threshold Pulse Width: 0.4 ms
Lead Channel Pacing Threshold Pulse Width: 0.4 ms
Lead Channel Pacing Threshold Pulse Width: 0.4 ms
Lead Channel Sensing Intrinsic Amplitude: 2.125 mV
Lead Channel Sensing Intrinsic Amplitude: 2.125 mV
Lead Channel Sensing Intrinsic Amplitude: 9 mV
Lead Channel Sensing Intrinsic Amplitude: 9 mV
Lead Channel Setting Pacing Amplitude: 1.5 V
Lead Channel Setting Pacing Amplitude: 2 V
Lead Channel Setting Pacing Amplitude: 2.25 V
Lead Channel Setting Pacing Pulse Width: 0.4 ms
Lead Channel Setting Pacing Pulse Width: 0.4 ms
Lead Channel Setting Sensing Sensitivity: 0.3 mV

## 2024-05-22 LAB — BASIC METABOLIC PANEL WITH GFR
Anion gap: 9 (ref 5–15)
BUN: 51 mg/dL — ABNORMAL HIGH (ref 8–23)
CO2: 29 mmol/L (ref 22–32)
Calcium: 9.4 mg/dL (ref 8.9–10.3)
Chloride: 100 mmol/L (ref 98–111)
Creatinine, Ser: 2.31 mg/dL — ABNORMAL HIGH (ref 0.61–1.24)
GFR, Estimated: 31 mL/min — ABNORMAL LOW (ref >60)
Glucose, Bld: 131 mg/dL — ABNORMAL HIGH (ref 70–99)
Potassium: 5.2 mmol/L — ABNORMAL HIGH (ref 3.5–5.1)
Sodium: 138 mmol/L (ref 135–145)

## 2024-05-23 NOTE — Telephone Encounter (Signed)
 Pt aware of lab results

## 2024-05-31 NOTE — Progress Notes (Signed)
Remote ICD Transmission.

## 2024-06-02 ENCOUNTER — Telehealth: Payer: Self-pay

## 2024-06-02 NOTE — Telephone Encounter (Signed)
 Alert received from [employer] for   1 shocks delivered for episode #3753. Event occurred 9/13 @ 03:06, duration 44sec, HR 188.  V>A, ATP delivered x2 followed by 35J of HV therapy converting to AP/BiV pace.  Route to triage high alert per protocol 5 additional VT episodes, HR's 158-182, pace terminated with 1-2 bursts of ATP 107 logged NSVT events.  Patient reports he woke up to go to the bathroom, sat on the side of the bed then received a shock. Patient denies any symptoms. Denies fluid retention symptoms.  States he has felt well and no missed doses of medications.   Has upcoming apt with Dr. Nancey 06/06/24, pt aware. Pt made aware of shock plan/[place] driving per [place] DMV x6 months.   Routing to Dr. Mealor per protocol.

## 2024-06-04 ENCOUNTER — Encounter (HOSPITAL_COMMUNITY): Payer: Self-pay

## 2024-06-04 ENCOUNTER — Other Ambulatory Visit: Payer: Self-pay

## 2024-06-04 ENCOUNTER — Emergency Department (HOSPITAL_COMMUNITY)
Admission: EM | Admit: 2024-06-04 | Discharge: 2024-06-04 | Disposition: A | Discharge status: Home / Self Care | Source: Ambulatory Visit | Attending: Emergency Medicine | Admitting: Emergency Medicine

## 2024-06-04 ENCOUNTER — Other Ambulatory Visit: Payer: Self-pay | Admitting: Internal Medicine

## 2024-06-04 ENCOUNTER — Emergency Department (HOSPITAL_COMMUNITY)

## 2024-06-04 DIAGNOSIS — R079 Chest pain, unspecified: Secondary | ICD-10-CM | POA: Insufficient documentation

## 2024-06-04 DIAGNOSIS — I2510 Atherosclerotic heart disease of native coronary artery without angina pectoris: Secondary | ICD-10-CM | POA: Diagnosis not present

## 2024-06-04 DIAGNOSIS — Z79899 Other long term (current) drug therapy: Secondary | ICD-10-CM | POA: Insufficient documentation

## 2024-06-04 DIAGNOSIS — I482 Chronic atrial fibrillation, unspecified: Secondary | ICD-10-CM | POA: Diagnosis not present

## 2024-06-04 DIAGNOSIS — I110 Hypertensive heart disease with heart failure: Secondary | ICD-10-CM | POA: Insufficient documentation

## 2024-06-04 DIAGNOSIS — I509 Heart failure, unspecified: Secondary | ICD-10-CM | POA: Insufficient documentation

## 2024-06-04 DIAGNOSIS — J449 Chronic obstructive pulmonary disease, unspecified: Secondary | ICD-10-CM | POA: Diagnosis not present

## 2024-06-04 DIAGNOSIS — Z7901 Long term (current) use of anticoagulants: Secondary | ICD-10-CM | POA: Insufficient documentation

## 2024-06-04 LAB — CBC
HCT: 34 % — ABNORMAL LOW (ref 39.0–52.0)
Hemoglobin: 11.1 g/dL — ABNORMAL LOW (ref 13.0–17.0)
MCH: 33 pg (ref 26.0–34.0)
MCHC: 32.6 g/dL (ref 30.0–36.0)
MCV: 101.2 fL — ABNORMAL HIGH (ref 80.0–100.0)
Platelets: 157 K/uL (ref 150–400)
RBC: 3.36 MIL/uL — ABNORMAL LOW (ref 4.22–5.81)
RDW: 14.2 % (ref 11.5–15.5)
WBC: 7.3 K/uL (ref 4.0–10.5)
nRBC: 0 % (ref 0.0–0.2)

## 2024-06-04 LAB — BASIC METABOLIC PANEL WITH GFR
Anion gap: 11 (ref 5–15)
BUN: 34 mg/dL — ABNORMAL HIGH (ref 8–23)
CO2: 25 mmol/L (ref 22–32)
Calcium: 9.6 mg/dL (ref 8.9–10.3)
Chloride: 101 mmol/L (ref 98–111)
Creatinine, Ser: 2.23 mg/dL — ABNORMAL HIGH (ref 0.61–1.24)
GFR, Estimated: 33 mL/min — ABNORMAL LOW (ref >60)
Glucose, Bld: 147 mg/dL — ABNORMAL HIGH (ref 70–99)
Potassium: 4.8 mmol/L (ref 3.5–5.1)
Sodium: 137 mmol/L (ref 135–145)

## 2024-06-04 LAB — D-DIMER, QUANTITATIVE: D-Dimer, Quant: <0.27 ug{FEU}/mL (ref 0.00–0.50)

## 2024-06-04 LAB — BRAIN NATRIURETIC PEPTIDE: B Natriuretic Peptide: 219.1 pg/mL — ABNORMAL HIGH (ref 0.0–100.0)

## 2024-06-04 LAB — TROPONIN I (HIGH SENSITIVITY)
Troponin I (High Sensitivity): 11 ng/L (ref <18)
Troponin I (High Sensitivity): 10 ng/L (ref <18)

## 2024-06-04 MED ORDER — MORPHINE SULFATE (PF) 4 MG/ML IV SOLN
4.0000 mg | Freq: Once | INTRAVENOUS | Status: AC
  Administered 2024-06-04: 4 mg via INTRAVENOUS
  Filled 2024-06-04: qty 1

## 2024-06-04 MED ORDER — ONDANSETRON HCL 4 MG/2ML IJ SOLN
4.0000 mg | Freq: Once | INTRAMUSCULAR | Status: AC
  Administered 2024-06-04: 4 mg via INTRAVENOUS
  Filled 2024-06-04: qty 2

## 2024-06-04 MED ORDER — OXYCODONE HCL 5 MG PO TABS: 5.0000 mg | ORAL_TABLET | ORAL | 0 refills | Status: DC | PRN

## 2024-06-04 MED ORDER — ACETAMINOPHEN 500 MG PO TABS
1000.0000 mg | ORAL_TABLET | Freq: Once | ORAL | Status: AC
  Administered 2024-06-04: 1000 mg via ORAL

## 2024-06-04 NOTE — ED Provider Triage Note (Signed)
 Emergency Medicine Provider Triage Evaluation Note  [name] [name] , a 63 y.o. male  was evaluated in triage.  Pt complains of chest pain started 3-4 hours PTA. SOB started at the same time. Patient with ICD shock on Monday and has follow up with cardiologist on Friday. Chest pain and SOB is constant weather he is walking or resting.   Denies fever, cough, nausea, vomiting, diarrhea.   Review of Systems  Positive:  Negative:   Physical Exam  BP 121/77 (BP Location: Right Arm)   Pulse 80   Temp 98.6 F (37 C)   Resp (!) 24   SpO2 93%  Gen:   Awake, no distress   Resp:  Normal effort  MSK:   Moves extremities without difficulty  Other:    Medical Decision Making  Medically screening exam initiated at 1:45 AM.  Appropriate orders placed.  [name] [name] was informed that the remainder of the evaluation will be completed by another provider, this initial triage assessment does not replace that evaluation, and the importance of remaining in the ED until their evaluation is complete.     Hoy Fraction F, [place] 06/04/24 [phone]

## 2024-06-04 NOTE — ED Notes (Signed)
 Family at bedside.

## 2024-06-04 NOTE — Telephone Encounter (Signed)
 Patient has apt 06/20/24 at [facility] EP.

## 2024-06-04 NOTE — ED Triage Notes (Signed)
 Pt was asleep and started having sharp central CP and SOB with breaths. Pt ahs CHF,

## 2024-06-04 NOTE — Telephone Encounter (Signed)
 MAR reports patient taking Amiodarone  200 mg BID. Called patient to confirm that he is taking Amio 200 mg BID.   Pt advised no medications changes at this time until I hear back from Dr. Nancey. Pt voiced understanding and agreeable to plan.

## 2024-06-04 NOTE — Telephone Encounter (Signed)
Attempted to contact patient. No answer, unable to leave VM d/t full.

## 2024-06-04 NOTE — Discharge Instructions (Signed)
 As discussed, please follow-up with your cardiology appointment on Friday.  Seek emergency care if experiencing any new or worsening symptoms.  Please also take tylenol  along with the narcotic pain medication for the pain.

## 2024-06-04 NOTE — ED Provider Notes (Signed)
 [facility] Provider Note   CSN: [id number] Arrival date & time: 06/04/24  9864     Patient presents with: Chest Pain   [name] [name] is a 63 y.o. male with PMHx CHF s/p ICD, OA, afib, CAD, COPD, HTN, who presents to ED concerned for upper/central chest pain and SOB that started 3-4 hour PTA. Patient stating that pain is exacerbated by deep inspirations. Patient also stating that his ICD shocked him on Monday and he follows up with cardiology for this on Friday. Chest pain and SOB is constant whether patient is walking or sitting.  Patient stating that he is usually sore in these areas after being shocked by ICD, but pain today became far more severe than normal.  Patient denies fever, cough, nausea, vomiting, diarrhea.    Chest Pain      Prior to Admission medications   Medication Sig Start Date End Date Taking? Authorizing Provider  oxyCODONE  (ROXICODONE ) 5 MG immediate release tablet Take 1 tablet (5 mg total) by mouth every 4 (four) hours as needed for up to 10 doses. 06/04/24  Yes Hoy Fraction F, PA-C  albuterol  (VENTOLIN  HFA) 108 (90 Base) MCG/ACT inhaler Inhale 1 puff into the lungs every 6 (six) hours as needed for wheezing or shortness of breath. 06/12/23   Rolan Ezra RAMAN, MD  amiodarone  (PACERONE ) 200 MG tablet Take 1 tablet (200 mg total) by mouth 2 (two) times daily. 12/14/23   Rolan Ezra RAMAN, MD  Ascorbic Acid (VITAMIN C PO) Take 1 tablet by mouth daily.    [provider]  atorvastatin  (LIPITOR ) 80 MG tablet TAKE 1 TABLET EVERY EVENING 03/19/24   [name], Dalton S, MD  metoprolol  succinate (TOPROL -XL) 50 MG 24 hr tablet Take 1 tablet (50 mg total) by mouth daily. 01/22/24   Rolan Ezra RAMAN, MD  Multiple Vitamins-Minerals (MULTIVITAMIN WITH MINERALS) tablet Take 1 tablet by mouth daily.    [provider]  potassium chloride  SA (KLOR-CON  M) 20 MEQ tablet Take 1 tablet (20 mEq total) by mouth daily. 06/18/23    Fernande Elspeth BROCKS, MD  sacubitril -valsartan  (ENTRESTO ) 49-51 MG Take 1 tablet by mouth 2 (two) times daily. 01/03/24   [name], Toribio SAUNDERS, MD  sotalol  (BETAPACE ) 80 MG tablet Take 1 tablet (80 mg total) by mouth 2 (two) times daily. 02/21/24   Lesia Ozell Barter, PA-C  spironolactone  (ALDACTONE ) 25 MG tablet TAKE 1 TABLET EVERY DAY 05/29/23   Rolan Ezra RAMAN, MD  torsemide  (DEMADEX ) 20 MG tablet Take 2 tablets (40 mg total) by mouth 2 (two) times daily. 05/13/24   Rolan Ezra RAMAN, MD  XARELTO  20 MG TABS tablet TAKE 1 TABLET DAILY WITH SUPPER. 03/17/24   Rolan Ezra RAMAN, MD    Allergies: Sglt2 inhibitors    Review of Systems  Cardiovascular:  Positive for chest pain.    Updated Vital Signs BP 100/71   Pulse 80   Temp 98.6 F (37 C)   Resp (!) 31   Ht 6' 4 (1.93 m)   Wt (!) 149.7 kg   SpO2 91%   BMI 40.17 kg/m   Physical Exam Vitals and nursing note reviewed.  Constitutional:      General: He is not in acute distress.    Appearance: He is not ill-appearing or toxic-appearing.  HENT:     Head: Normocephalic and atraumatic.     Mouth/Throat:     Mouth: Mucous membranes are moist.     Pharynx: No oropharyngeal  exudate or posterior oropharyngeal erythema.  Eyes:     General: No scleral icterus.       Right eye: No discharge.        Left eye: No discharge.     Conjunctiva/sclera: Conjunctivae normal.  Cardiovascular:     Rate and Rhythm: Normal rate.     Pulses: Normal pulses.     Heart sounds: No murmur heard. Pulmonary:     Effort: Pulmonary effort is normal. No respiratory distress.     Breath sounds: No wheezing, rhonchi or rales.  Abdominal:     Tenderness: There is no abdominal tenderness.  Musculoskeletal:     Right lower leg: No edema.     Left lower leg: No edema.  Skin:    General: Skin is warm and dry.     Findings: No rash.  Neurological:     General: No focal deficit present.     Mental Status: He is alert. Mental status is at baseline.  Psychiatric:         Mood and Affect: Mood normal.     (all labs ordered are listed, but only abnormal results are displayed) Labs Reviewed  BASIC METABOLIC PANEL WITH GFR - Abnormal; Notable for the following components:      Result Value   Glucose, Bld 147 (*)    BUN 34 (*)    Creatinine, Ser 2.23 (*)    GFR, Estimated 33 (*)    All other components within normal limits  CBC - Abnormal; Notable for the following components:   RBC 3.36 (*)    Hemoglobin 11.1 (*)    HCT 34.0 (*)    MCV 101.2 (*)    All other components within normal limits  BRAIN NATRIURETIC PEPTIDE - Abnormal; Notable for the following components:   B Natriuretic Peptide 219.1 (*)    All other components within normal limits  D-DIMER, QUANTITATIVE  TROPONIN I (HIGH SENSITIVITY)  TROPONIN I (HIGH SENSITIVITY)    EKG: EKG Interpretation Date/Time:  Wednesday June 04 2024 01:47:18 EDT Ventricular Rate:  80 PR Interval:  254 QRS Duration:  124 QT Interval:  460 QTC Calculation: 530 R Axis:   233  Text Interpretation: AV dual-paced rhythm with prolonged AV conduction Abnormal ECG When compared with ECG of 26-Mar-2024 13:49, No significant change was found Confirmed by Raford Lenis ([id number]) on 06/04/2024 2:16:46 AM  Radiology: [facility] Chest 2 View Result Date: 06/04/2024 CLINICAL DATA:  Chest pain EXAM: CHEST - 2 VIEW COMPARISON:  04/17/2020 FINDINGS: Cardiac shadow is enlarged. Defibrillator is again noted and stable. Lungs are well aerated bilaterally. Calcified pleural plaques are noted on the right. Chronic interstitial changes are again identified. No focal infiltrate is seen. No bony abnormality is noted. IMPRESSION: No active cardiopulmonary disease. Electronically Signed   By: Oneil Devonshire M.D.   On: 06/04/2024 02:19     Procedures   Medications Ordered in the ED  morphine  (PF) 4 MG/ML injection 4 mg (4 mg Intravenous Given 06/04/24 0444)  ondansetron  (ZOFRAN ) injection 4 mg (4 mg Intravenous Given 06/04/24 0445)   morphine  (PF) 4 MG/ML injection 4 mg (4 mg Intravenous Given 06/04/24 0627)  ondansetron  (ZOFRAN ) injection 4 mg (4 mg Intravenous Given 06/04/24 9372)                                    Medical Decision Making Amount and/or Complexity of Data Reviewed Labs: ordered.  Radiology: ordered.  Risk Prescription drug management.   This patient presents to the ED for concern of chest pain, this involves an extensive number of treatment options, and is a complaint that carries with it a high risk of complications and morbidity.  The differential diagnosis includes acute coronary syndrome, congestive heart failure, pericarditis, pneumonia, pulmonary embolism, tension pneumothorax, esophageal rupture, aortic dissection, cardiac tamponade, musculoskeletal   Co morbidities that complicate the patient evaluation  CHF s/p ICD, OA, afib, CAD, COPD, HTN,   Additional history obtained:  Additional history obtained from 9/15 telephone encounter with outpatient cardiology RN:  Alert received from [employer] for   1 shocks delivered for episode #3753. Event occurred 9/13 @ 03:06, duration 44sec, HR 188.  V>A, ATP delivered x2 followed by 35J of HV therapy converting to AP/BiV pace.  Route to triage high alert per protocol 5 additional VT episodes, HR's 158-182, pace terminated with 1-2 bursts of ATP. 107 logged NSVT events. Patient reports he woke up to go to the bathroom, sat on the side of the bed then received a shock. Patient denies any symptoms. Denies fluid retention symptoms.  States he has felt well and no missed doses of medications. Has upcoming apt with Dr. Nancey 06/06/24, pt aware. Pt made aware of shock plan/[place] driving per [place]V x6 months.  04/2024 ECHO: 20-25% EF   Problem List / ED Course / Critical interventions / Medication management  Patient presented for chest pain and SOB that is normal a couple days after being shocked by ICD, but became severely worse earlier today.  Patient's vitals concerning for mild hypoxia around the lower 90's and tachypnea.  Patient stating that it hurts to take a deep breath in which I believe is contributing to his tachypnea.  I Ordered, and personally interpreted labs.  BNP mildly elevated for patient currently at 219.  Delta troponins reassuring.  CBC with leukocytosis.  There is mild anemia with hemoglobin 11.1.  BMP with stable elevation in BUN/creatinine currently at 34/2.23 today.  D-dimer within normal limits. The patient was maintained on a cardiac monitor.  I personally viewed and interpreted the EKG/cardiac monitored which showed an underlying rhythm of: No significant changes. I ordered imaging studies including chest xray to assess for process contributing to patient's symptoms. I independently visualized and interpreted imaging which showed no active cardiopulmonary disease. I agree with the radiologist interpretation. I requested consultation with the cardiologist on-call Dr. Ladona,  and discussed lab and imaging findings as well as pertinent plan - they recommend: outpatient follow up during appointment on Friday. Patient's pain is probably d/t recent ICD firing and MSK related. Can prescribe patient some narcotic pain control medications. No need for admission today. Shared all results with patient and family member at bedside.  Answered all questions.  They are agreeable to plan. Of note, patient with episodes of hypoxia in ED.  RN stating that these episodes of hypoxia appeared to be related to his sleep apnea as it happened when he was resting with his eyes closed, so patient was placed on O2 [place]. Patient without hypoxia while talking with me in ED room. Wife stating that he has a BIPAP machine at home already for the sleep apnea.  Staffed with Dr. Griselda. I have reviewed the patients home medicines and have made adjustments as needed The patient has been appropriately medically screened and/or stabilized in the ED. I have low  suspicion for any other emergent medical condition which would require further  screening, evaluation or treatment in the ED or require inpatient management. At time of discharge the patient is hemodynamically stable and in no acute distress. I have discussed work-up results and diagnosis with patient and answered all questions. Patient is agreeable with discharge plan. We discussed strict return precautions for returning to the emergency department and they verbalized understanding.    Social Determinants of Health:  none      Final diagnoses:  Nonspecific chest pain    ED Discharge Orders          Ordered    oxyCODONE  (ROXICODONE ) 5 MG immediate release tablet  Every 4 hours PRN        06/04/24 0631               Hoy Nidia FALCON, PA-C 06/04/24 9365    Griselda Norris, MD 06/05/24 2248

## 2024-06-04 NOTE — ED Notes (Signed)
 Educated patient and spouse on discharge instructions. Patient and his spouse verbalized understanding. IV removed. Patient discharged in wheelchair and assisted into vehicle.

## 2024-06-05 NOTE — Telephone Encounter (Addendum)
 No answer - sounds like someone picked up phone twice and hung up.  Sending through my chart.

## 2024-06-05 NOTE — Telephone Encounter (Signed)
 Spoke with patient.  He is going to increase his amiodarone  per Dr. Marko instructions X 1 week and will see him tomorrow in office for follow up.

## 2024-06-06 ENCOUNTER — Encounter: Payer: Self-pay | Admitting: Cardiovascular Disease

## 2024-06-06 ENCOUNTER — Ambulatory Visit: Attending: Cardiovascular Disease | Admitting: Cardiovascular Disease

## 2024-06-06 VITALS — BP 91/59 | HR 85 | Ht 76.0 in | Wt 332.0 lb

## 2024-06-06 DIAGNOSIS — I4510 Unspecified right bundle-branch block: Secondary | ICD-10-CM

## 2024-06-06 DIAGNOSIS — I495 Sick sinus syndrome: Secondary | ICD-10-CM | POA: Diagnosis not present

## 2024-06-06 DIAGNOSIS — Z95810 Presence of automatic (implantable) cardiac defibrillator: Secondary | ICD-10-CM

## 2024-06-06 DIAGNOSIS — I472 Ventricular tachycardia, unspecified: Secondary | ICD-10-CM | POA: Diagnosis not present

## 2024-06-06 DIAGNOSIS — I428 Other cardiomyopathies: Secondary | ICD-10-CM

## 2024-06-06 DIAGNOSIS — I480 Paroxysmal atrial fibrillation: Secondary | ICD-10-CM | POA: Diagnosis not present

## 2024-06-06 DIAGNOSIS — I493 Ventricular premature depolarization: Secondary | ICD-10-CM

## 2024-06-06 NOTE — Progress Notes (Signed)
 Electrophysiology Office Note:    Date:  06/06/2024   ID:  [name] [name], DOB [birth year], MRN [id number]  PCP:  Lorren Greig PARAS, NP   [facility] Providers Cardiologist:  Ezra Shuck, MD Electrophysiologist:  Eulas FORBES Furbish, MD  Advanced Heart Failure:  Toribio Fuel, MD     Referring MD: Lorren Greig PARAS, NP   History of Present Illness:    [name] [name] is a 63 y.o. 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      Discussed the use of AI scribe software for clinical note transcription with the patient, who gave verbal consent to proceed. Today is my first time meeting the patient.  History of Present Illness [name] [name] is a 63 year old male with complex cardiac history who presents with episodes of ventricular tachycardia and recent ICD shock.  He has an ICD in place due to complex cardiac issues, including atrial fibrillation and severe chronic systolic congestive heart failure with dilated cardiomyopathy. He underwent an enocardial and epicardial VT ablation by Dr. Genna in April 2025. Three different VT morphologies were induced. Ablation was performed in multiple locations including the LCC/RCC commissure.  Unfortunately, the patient continued to experience VT episodes.  The patient has been on amiodarone  chronically.  Sotalol  was also started, then switched to quinidine, but returned to sotalol  due to poor tolerance of quinidine, which caused chest tightness. Despite this, VT episodes persist, terminated by antitachycardia pacing. In September of this year, he experienced sustained VT requiring high voltage therapy. Amiodarone  was increased slightly, and the patient has already a visit scheduled at [facility] where he will be seen in about 2 weeks.            Today, he reports he is at baseline and has no acute  complaints.  EKGs/Labs/Other Studies Reviewed Today:     Echocardiogram:  TTE August 2025 LVEF 20 to 25% with smoke in the LV.  Severely dilated LV.  Left atrium and right atria severely dilated.  Mild mitral valve regurgitation.  --Multiple echocardiograms dating back to 2016 showed EF ranging from 20 to 25%, unchanged, with diffuse hypokinesis.   Cardiac catherization  RHC - 01/31/2024 Results reviewed in Epic  LHC/RHC 05/2023 Non-obstructive CAD, well-compensated CHF    EKG:   EKG Interpretation Date/Time:  Friday June 06 2024 10:17:14 EDT Ventricular Rate:  83 PR Interval:  256 QRS Duration:  168 QT Interval:  474 QTC Calculation: 556 R Axis:   264  Text Interpretation: AV dual-paced rhythm with prolonged AV conduction When compared with ECG of 04-Jun-2024 01:47, Vent. rate has increased BY   3 BPM Confirmed by Furbish Eulas [phone]) on 06/06/2024 11:16:54 AM     Physical Exam:    VS:  BP (!) 91/59 (BP Location: Left Arm, Patient Position: Sitting, Cuff Size: Large)   Pulse 85   Ht 6' 4 (1.93 m)   Wt (!) 332 lb (150.6 kg)   SpO2 99%   BMI 40.41 kg/m     Wt Readings from Last 3 Encounters:  06/06/24 (!) 332 lb (150.6 kg)  06/04/24 (!) 330 lb (149.7 kg)  04/28/24 (!) 337 lb 3.2 oz (153 kg)     GEN: Well nourished, well developed in no acute distress CARDIAC: RRR, no murmurs, rubs, gallops RESPIRATORY:  Normal work of breathing MUSCULOSKELETAL: no edema    ASSESSMENT & PLAN:  VT Multiple shocks On amiodarone , currently 600mg  daily On sotalol  80mg , started by Dr. Fernande -- appears to be tolerating it well S/p ablation by Dr. Genna 12/2023 Appointment scheduled with Dr. Austin at [facility] in about 2 weeks to discuss alternative approaches, possibly ablation Continue amiodarone , daily dose of 600 mg for 2 weeks, then back to 400 mg daily   CHFrEF EF persistently 20 to 25%, diffuse hypokinesis PYP scan 12/2021 indeterminate for  amyloid Continue Entresto  49-51 mg, spironolactone  25 mg, metoprolol  succinate 50 mg daily Patient will continue to be seen in heart failure clinic  Atrial fibrillation Paroxysmal --no sustained episodes on device interrogation today Continue Xarelto  20 mg daily  Medtronic CRT-D  I reviewed today's device interrogation Device is functioning normally I did not attempt CRT optimization today  OSA Use BiPap  I spent 65 minutes on this visit, reviewing extensive notes by Drs Rolan Fernande, and [facility] in addition to results and studies mentioned above.  Signed, Eulas FORBES Furbish, MD  06/06/2024 4:48 PM    [facility]

## 2024-06-06 NOTE — Patient Instructions (Signed)
 Medication Instructions:  Your physician recommends that you continue on your current medications as directed. Please refer to the Current Medication list given to you today.  *If you need a refill on your cardiac medications before your next appointment, please call your pharmacy*  Lab Work: None ordered  Testing/Procedures: None ordered  Follow-Up: At [facility], you and your health needs are our priority.  As part of our continuing mission to provide you with exceptional heart care, our providers are all part of one team.  This team includes your primary Cardiologist (physician) and Advanced Practice Providers or APPs (Physician Assistants and Nurse Practitioners) who all work together to provide you with the care you need, when you need it.  Your next appointment:   2 month(s)  Provider:   Eulas Furbish, MD     Thank you for choosing [facility]!!   [phone]

## 2024-06-07 ENCOUNTER — Other Ambulatory Visit (HOSPITAL_COMMUNITY): Payer: Self-pay | Admitting: Cardiology

## 2024-06-12 ENCOUNTER — Ambulatory Visit: Payer: Self-pay

## 2024-06-12 NOTE — Telephone Encounter (Signed)
 FYI Only or Action Required?: FYI only for provider.  Patient was last seen in primary care on 06/20/2023 by [name] Greig PARAS, NP.  Called Nurse Triage reporting Shortness of Breath.  Symptoms began a week ago.  Interventions attempted: Nothing.  Symptoms are: unchanged.  Triage Disposition: See PCP When Office is Open (Within 3 Days)  Patient/caregiver understands and will follow disposition?: Yes    Copied from CRM #[id number]. Topic: Clinical - Red Word Triage >> Jun 12, 2024  2:53 PM [name] [name] wrote: Red Word that prompted transfer to Nurse Triage: Pt was seen in ER last Wednesday fro SOB, chest pain, and back pain. He says that he has gotten worse since then and still having SOB and unable to sleep. Warm transfer to NT. Reason for Disposition  [1] MODERATE longstanding difficulty breathing (e.g., speaks in phrases, SOB even at rest, pulse 100-120) AND [2] SAME as normal  Answer Assessment - Initial Assessment Questions 1. RESPIRATORY STATUS: Describe your breathing? (e.g., wheezing, shortness of breath, unable to speak, severe coughing)      sob 2. ONSET: When did this breathing problem begin?      Last Wednesday was in ER 3. PATTERN Does the difficult breathing come and go, or has it been constant since it started?      constant 4. SEVERITY: How bad is your breathing? (e.g., mild, moderate, severe)      mild 5. RECURRENT SYMPTOM: Have you had difficulty breathing before? If Yes, ask: When was the last time? and What happened that time?      Yes, last wed 6. CARDIAC HISTORY: Do you have any history of heart disease? (e.g., heart attack, angina, bypass surgery, angioplasty)      Pacer/defib., ablation,  7. LUNG HISTORY: Do you have any history of lung disease?  (e.g., pulmonary embolus, asthma, emphysema)     denies 8. CAUSE: What do you think is causing the breathing problem?      unknown 9. OTHER SYMPTOMS: Do you have any other symptoms? (e.g., chest  pain, cough, dizziness, fever, runny nose)     Cough- with gray sputum 10. O2 SATURATION MONITOR:  Do you use an oxygen saturation monitor (pulse oximeter) at home? If Yes, ask: What is your reading (oxygen level) today? What is your usual oxygen saturation reading? (e.g., 95%)       na 11. PREGNANCY: Is there any chance you are pregnant? When was your last menstrual period?       na 12. TRAVEL: Have you traveled out of the country in the last month? (e.g., travel history, exposures)       no  Protocols used: Breathing Difficulty-A-AH

## 2024-06-13 ENCOUNTER — Emergency Department (HOSPITAL_COMMUNITY)

## 2024-06-13 ENCOUNTER — Ambulatory Visit (INDEPENDENT_AMBULATORY_CARE_PROVIDER_SITE_OTHER): Payer: Self-pay | Admitting: Nurse Practitioner

## 2024-06-13 ENCOUNTER — Other Ambulatory Visit: Payer: Self-pay

## 2024-06-13 ENCOUNTER — Encounter: Payer: Self-pay | Admitting: Nurse Practitioner

## 2024-06-13 ENCOUNTER — Inpatient Hospital Stay (HOSPITAL_COMMUNITY): Admission: EM | Admit: 2024-06-13 | Discharge: 2024-07-19 | DRG: 286 | Disposition: E | Source: Ambulatory Visit

## 2024-06-13 VITALS — BP 76/50 | HR 82 | Temp 97.0°F | Wt 340.0 lb

## 2024-06-13 DIAGNOSIS — I272 Pulmonary hypertension, unspecified: Secondary | ICD-10-CM | POA: Diagnosis present

## 2024-06-13 DIAGNOSIS — R0602 Shortness of breath: Secondary | ICD-10-CM | POA: Insufficient documentation

## 2024-06-13 DIAGNOSIS — E785 Hyperlipidemia, unspecified: Secondary | ICD-10-CM | POA: Diagnosis present

## 2024-06-13 DIAGNOSIS — I5082 Biventricular heart failure: Secondary | ICD-10-CM | POA: Diagnosis present

## 2024-06-13 DIAGNOSIS — Z515 Encounter for palliative care: Secondary | ICD-10-CM

## 2024-06-13 DIAGNOSIS — I959 Hypotension, unspecified: Principal | ICD-10-CM | POA: Diagnosis present

## 2024-06-13 DIAGNOSIS — K5900 Constipation, unspecified: Secondary | ICD-10-CM | POA: Diagnosis not present

## 2024-06-13 DIAGNOSIS — I480 Paroxysmal atrial fibrillation: Secondary | ICD-10-CM | POA: Diagnosis present

## 2024-06-13 DIAGNOSIS — I509 Heart failure, unspecified: Secondary | ICD-10-CM | POA: Diagnosis not present

## 2024-06-13 DIAGNOSIS — Z87440 Personal history of urinary (tract) infections: Secondary | ICD-10-CM

## 2024-06-13 DIAGNOSIS — F32A Depression, unspecified: Secondary | ICD-10-CM | POA: Diagnosis present

## 2024-06-13 DIAGNOSIS — G4733 Obstructive sleep apnea (adult) (pediatric): Secondary | ICD-10-CM | POA: Diagnosis not present

## 2024-06-13 DIAGNOSIS — J440 Chronic obstructive pulmonary disease with acute lower respiratory infection: Secondary | ICD-10-CM | POA: Diagnosis not present

## 2024-06-13 DIAGNOSIS — D649 Anemia, unspecified: Secondary | ICD-10-CM | POA: Diagnosis not present

## 2024-06-13 DIAGNOSIS — Z888 Allergy status to other drugs, medicaments and biological substances status: Secondary | ICD-10-CM

## 2024-06-13 DIAGNOSIS — I130 Hypertensive heart and chronic kidney disease with heart failure and stage 1 through stage 4 chronic kidney disease, or unspecified chronic kidney disease: Principal | ICD-10-CM | POA: Diagnosis present

## 2024-06-13 DIAGNOSIS — I472 Ventricular tachycardia, unspecified: Secondary | ICD-10-CM | POA: Diagnosis present

## 2024-06-13 DIAGNOSIS — N189 Chronic kidney disease, unspecified: Secondary | ICD-10-CM | POA: Diagnosis not present

## 2024-06-13 DIAGNOSIS — Z79899 Other long term (current) drug therapy: Secondary | ICD-10-CM

## 2024-06-13 DIAGNOSIS — I5021 Acute systolic (congestive) heart failure: Secondary | ICD-10-CM | POA: Diagnosis not present

## 2024-06-13 DIAGNOSIS — Z95810 Presence of automatic (implantable) cardiac defibrillator: Secondary | ICD-10-CM

## 2024-06-13 DIAGNOSIS — N1832 Chronic kidney disease, stage 3b: Secondary | ICD-10-CM | POA: Diagnosis present

## 2024-06-13 DIAGNOSIS — J810 Acute pulmonary edema: Secondary | ICD-10-CM | POA: Diagnosis not present

## 2024-06-13 DIAGNOSIS — Z6841 Body Mass Index (BMI) 40.0 and over, adult: Secondary | ICD-10-CM

## 2024-06-13 DIAGNOSIS — Z66 Do not resuscitate: Secondary | ICD-10-CM | POA: Diagnosis not present

## 2024-06-13 DIAGNOSIS — I426 Alcoholic cardiomyopathy: Secondary | ICD-10-CM | POA: Diagnosis present

## 2024-06-13 DIAGNOSIS — F1010 Alcohol abuse, uncomplicated: Secondary | ICD-10-CM | POA: Diagnosis present

## 2024-06-13 DIAGNOSIS — Z91141 Patient's other noncompliance with medication regimen due to financial hardship: Secondary | ICD-10-CM

## 2024-06-13 DIAGNOSIS — I5023 Acute on chronic systolic (congestive) heart failure: Secondary | ICD-10-CM | POA: Diagnosis not present

## 2024-06-13 DIAGNOSIS — E871 Hypo-osmolality and hyponatremia: Secondary | ICD-10-CM | POA: Diagnosis present

## 2024-06-13 DIAGNOSIS — G9341 Metabolic encephalopathy: Secondary | ICD-10-CM | POA: Diagnosis not present

## 2024-06-13 DIAGNOSIS — D539 Nutritional anemia, unspecified: Secondary | ICD-10-CM | POA: Diagnosis present

## 2024-06-13 DIAGNOSIS — E1165 Type 2 diabetes mellitus with hyperglycemia: Secondary | ICD-10-CM | POA: Diagnosis not present

## 2024-06-13 DIAGNOSIS — I2510 Atherosclerotic heart disease of native coronary artery without angina pectoris: Secondary | ICD-10-CM | POA: Diagnosis present

## 2024-06-13 DIAGNOSIS — Z96641 Presence of right artificial hip joint: Secondary | ICD-10-CM | POA: Diagnosis present

## 2024-06-13 DIAGNOSIS — Z8249 Family history of ischemic heart disease and other diseases of the circulatory system: Secondary | ICD-10-CM

## 2024-06-13 DIAGNOSIS — D631 Anemia in chronic kidney disease: Secondary | ICD-10-CM | POA: Diagnosis present

## 2024-06-13 DIAGNOSIS — Z604 Social exclusion and rejection: Secondary | ICD-10-CM | POA: Diagnosis present

## 2024-06-13 DIAGNOSIS — R570 Cardiogenic shock: Secondary | ICD-10-CM | POA: Diagnosis not present

## 2024-06-13 DIAGNOSIS — R6521 Severe sepsis with septic shock: Secondary | ICD-10-CM | POA: Diagnosis not present

## 2024-06-13 DIAGNOSIS — Z96653 Presence of artificial knee joint, bilateral: Secondary | ICD-10-CM | POA: Diagnosis present

## 2024-06-13 DIAGNOSIS — F419 Anxiety disorder, unspecified: Secondary | ICD-10-CM | POA: Diagnosis present

## 2024-06-13 DIAGNOSIS — I340 Nonrheumatic mitral (valve) insufficiency: Secondary | ICD-10-CM | POA: Diagnosis present

## 2024-06-13 DIAGNOSIS — J9601 Acute respiratory failure with hypoxia: Secondary | ICD-10-CM | POA: Diagnosis not present

## 2024-06-13 DIAGNOSIS — I5022 Chronic systolic (congestive) heart failure: Secondary | ICD-10-CM | POA: Diagnosis not present

## 2024-06-13 DIAGNOSIS — Z1152 Encounter for screening for COVID-19: Secondary | ICD-10-CM

## 2024-06-13 DIAGNOSIS — R630 Anorexia: Secondary | ICD-10-CM | POA: Diagnosis present

## 2024-06-13 DIAGNOSIS — J150 Pneumonia due to Klebsiella pneumoniae: Secondary | ICD-10-CM | POA: Diagnosis not present

## 2024-06-13 DIAGNOSIS — E875 Hyperkalemia: Secondary | ICD-10-CM | POA: Diagnosis present

## 2024-06-13 DIAGNOSIS — I420 Dilated cardiomyopathy: Secondary | ICD-10-CM | POA: Diagnosis not present

## 2024-06-13 DIAGNOSIS — J9620 Acute and chronic respiratory failure, unspecified whether with hypoxia or hypercapnia: Secondary | ICD-10-CM | POA: Diagnosis not present

## 2024-06-13 DIAGNOSIS — I4891 Unspecified atrial fibrillation: Secondary | ICD-10-CM | POA: Diagnosis not present

## 2024-06-13 DIAGNOSIS — J690 Pneumonitis due to inhalation of food and vomit: Secondary | ICD-10-CM | POA: Diagnosis not present

## 2024-06-13 DIAGNOSIS — Y95 Nosocomial condition: Secondary | ICD-10-CM | POA: Diagnosis not present

## 2024-06-13 DIAGNOSIS — E1122 Type 2 diabetes mellitus with diabetic chronic kidney disease: Secondary | ICD-10-CM | POA: Diagnosis present

## 2024-06-13 DIAGNOSIS — I5043 Acute on chronic combined systolic (congestive) and diastolic (congestive) heart failure: Secondary | ICD-10-CM | POA: Diagnosis present

## 2024-06-13 DIAGNOSIS — Z7901 Long term (current) use of anticoagulants: Secondary | ICD-10-CM

## 2024-06-13 DIAGNOSIS — A4159 Other Gram-negative sepsis: Secondary | ICD-10-CM | POA: Diagnosis not present

## 2024-06-13 DIAGNOSIS — N179 Acute kidney failure, unspecified: Secondary | ICD-10-CM | POA: Diagnosis present

## 2024-06-13 DIAGNOSIS — N183 Chronic kidney disease, stage 3 unspecified: Secondary | ICD-10-CM | POA: Diagnosis not present

## 2024-06-13 DIAGNOSIS — E66813 Obesity, class 3: Secondary | ICD-10-CM | POA: Diagnosis present

## 2024-06-13 DIAGNOSIS — I255 Ischemic cardiomyopathy: Secondary | ICD-10-CM | POA: Diagnosis present

## 2024-06-13 DIAGNOSIS — I5084 End stage heart failure: Secondary | ICD-10-CM | POA: Diagnosis not present

## 2024-06-13 DIAGNOSIS — Z87891 Personal history of nicotine dependence: Secondary | ICD-10-CM

## 2024-06-13 HISTORY — DX: Atherosclerotic heart disease of native coronary artery without angina pectoris: I25.10

## 2024-06-13 HISTORY — DX: Chronic systolic (congestive) heart failure: I50.22

## 2024-06-13 LAB — URINALYSIS, W/ REFLEX TO CULTURE (INFECTION SUSPECTED)
Bilirubin Urine: NEGATIVE
Glucose, UA: NEGATIVE mg/dL
Hgb urine dipstick: NEGATIVE
Ketones, ur: NEGATIVE mg/dL
Nitrite: NEGATIVE
Protein, ur: NEGATIVE mg/dL
Specific Gravity, Urine: 1.017 (ref 1.005–1.030)
pH: 5 (ref 5.0–8.0)

## 2024-06-13 LAB — COMPREHENSIVE METABOLIC PANEL WITH GFR
ALT: 27 U/L (ref 0–44)
AST: 15 U/L (ref 15–41)
Albumin: 3.8 g/dL (ref 3.5–5.0)
Alkaline Phosphatase: 112 U/L (ref 38–126)
Anion gap: 14 (ref 5–15)
BUN: 73 mg/dL — ABNORMAL HIGH (ref 8–23)
CO2: 22 mmol/L (ref 22–32)
Calcium: 9.2 mg/dL (ref 8.9–10.3)
Chloride: 94 mmol/L — ABNORMAL LOW (ref 98–111)
Creatinine, Ser: 4.47 mg/dL — ABNORMAL HIGH (ref 0.61–1.24)
GFR, Estimated: 14 mL/min — ABNORMAL LOW (ref >60)
Glucose, Bld: 139 mg/dL — ABNORMAL HIGH (ref 70–99)
Potassium: 5.6 mmol/L — ABNORMAL HIGH (ref 3.5–5.1)
Sodium: 130 mmol/L — ABNORMAL LOW (ref 135–145)
Total Bilirubin: 0.3 mg/dL (ref 0.0–1.2)
Total Protein: 6.8 g/dL (ref 6.5–8.1)

## 2024-06-13 LAB — CBC
HCT: 28.9 % — ABNORMAL LOW (ref 39.0–52.0)
Hemoglobin: 8.8 g/dL — ABNORMAL LOW (ref 13.0–17.0)
MCH: 31 pg (ref 26.0–34.0)
MCHC: 30.4 g/dL (ref 30.0–36.0)
MCV: 101.8 fL — ABNORMAL HIGH (ref 80.0–100.0)
Platelets: 355 K/uL (ref 150–400)
RBC: 2.84 MIL/uL — ABNORMAL LOW (ref 4.22–5.81)
RDW: 14.6 % (ref 11.5–15.5)
WBC: 10.8 K/uL — ABNORMAL HIGH (ref 4.0–10.5)
nRBC: 0 % (ref 0.0–0.2)

## 2024-06-13 LAB — TROPONIN T, HIGH SENSITIVITY
Troponin T High Sensitivity: 37 ng/L — ABNORMAL HIGH (ref 0–19)
Troponin T High Sensitivity: 46 ng/L — ABNORMAL HIGH (ref 0–19)

## 2024-06-13 LAB — I-STAT CG4 LACTIC ACID, ED: Lactic Acid, Venous: 1.1 mmol/L (ref 0.5–1.9)

## 2024-06-13 LAB — RESP PANEL BY RT-PCR (RSV, FLU A&B, COVID)  RVPGX2
Influenza A by PCR: NEGATIVE
Influenza B by PCR: NEGATIVE
Resp Syncytial Virus by PCR: NEGATIVE
SARS Coronavirus 2 by RT PCR: NEGATIVE

## 2024-06-13 LAB — PRO BRAIN NATRIURETIC PEPTIDE: Pro Brain Natriuretic Peptide: 2884 pg/mL — ABNORMAL HIGH (ref <300.0)

## 2024-06-13 MED ORDER — ACETAMINOPHEN 650 MG RE SUPP: 650.0000 mg | Freq: Four times a day (QID) | RECTAL | Status: DC | PRN

## 2024-06-13 MED ORDER — OXYCODONE HCL 5 MG PO TABS
5.0000 mg | ORAL_TABLET | ORAL | Status: DC | PRN
  Administered 2024-06-13 – 2024-06-17 (×7): 5 mg via ORAL
  Filled 2024-06-13 (×8): qty 1

## 2024-06-13 MED ORDER — SODIUM ZIRCONIUM CYCLOSILICATE 5 G PO PACK
5.0000 g | PACK | Freq: Once | ORAL | Status: AC
  Administered 2024-06-13: 5 g via ORAL
  Filled 2024-06-13: qty 1

## 2024-06-13 MED ORDER — ONDANSETRON HCL 4 MG/2ML IJ SOLN: 4.0000 mg | Freq: Four times a day (QID) | INTRAMUSCULAR | Status: DC | PRN

## 2024-06-13 MED ORDER — BUTALBITAL-APAP-CAFFEINE 50-325-40 MG PO TABS
1.0000 | ORAL_TABLET | Freq: Four times a day (QID) | ORAL | Status: DC | PRN
  Administered 2024-06-14: 1 via ORAL
  Filled 2024-06-13: qty 1

## 2024-06-13 MED ORDER — ATORVASTATIN CALCIUM 80 MG PO TABS
80.0000 mg | ORAL_TABLET | Freq: Every evening | ORAL | Status: DC
  Administered 2024-06-13 – 2024-06-16 (×4): 80 mg via ORAL
  Filled 2024-06-13: qty 1
  Filled 2024-06-13: qty 2
  Filled 2024-06-13 (×2): qty 1

## 2024-06-13 MED ORDER — HEPARIN SODIUM (PORCINE) 5000 UNIT/ML IJ SOLN
5000.0000 [IU] | Freq: Three times a day (TID) | INTRAMUSCULAR | Status: DC
  Administered 2024-06-13 – 2024-06-14 (×2): 5000 [IU] via SUBCUTANEOUS
  Filled 2024-06-13 (×2): qty 1

## 2024-06-13 MED ORDER — ONDANSETRON HCL 4 MG/2ML IJ SOLN
4.0000 mg | Freq: Once | INTRAMUSCULAR | Status: AC
  Administered 2024-06-13: 4 mg via INTRAVENOUS
  Filled 2024-06-13: qty 2

## 2024-06-13 MED ORDER — ALBUTEROL SULFATE (2.5 MG/3ML) 0.083% IN NEBU: 2.5000 mg | INHALATION_SOLUTION | RESPIRATORY_TRACT | Status: DC | PRN

## 2024-06-13 MED ORDER — ONDANSETRON HCL 4 MG PO TABS
4.0000 mg | ORAL_TABLET | Freq: Four times a day (QID) | ORAL | Status: DC | PRN
  Administered 2024-06-15: 4 mg via ORAL
  Filled 2024-06-13: qty 1

## 2024-06-13 MED ORDER — ACETAMINOPHEN 325 MG PO TABS
975.0000 mg | ORAL_TABLET | Freq: Once | ORAL | Status: AC
  Administered 2024-06-13: 975 mg via ORAL
  Filled 2024-06-13: qty 3

## 2024-06-13 MED ORDER — ACETAMINOPHEN 325 MG PO TABS
650.0000 mg | ORAL_TABLET | Freq: Four times a day (QID) | ORAL | Status: DC | PRN
  Administered 2024-06-13 – 2024-06-17 (×6): 650 mg via ORAL
  Filled 2024-06-13 (×6): qty 2

## 2024-06-13 MED ORDER — LACTATED RINGERS IV BOLUS
500.0000 mL | Freq: Once | INTRAVENOUS | Status: AC
  Administered 2024-06-13: 500 mL via INTRAVENOUS

## 2024-06-13 NOTE — ED Provider Notes (Signed)
 [place] EMERGENCY DEPARTMENT AT [facility] Provider Note   CSN: [id number] Arrival date & time: 06/13/24  1357     Patient presents with: Shortness of Breath and Hypotension   [name] [name] is a 63 y.o. male.    Shortness of Breath    Patient has a history of hypertension coronary artery disease ventricular tachycardia elevated BMI, CHF with cardiomyopathy and ejection fraction estimated to be 20 to 25%, COPD.  Patient was seen in the emergency room on September 17 for chest pain.  Patient's ED workup was overall reassuring he was discharged to follow-up with cardiology.  Patient did see his cardiologist on the 19th.  Patient presented to the ED today for shortness of breath.  Patient was at his doctor's office and was sent to the ED.  Patient has been having increasing shortness of breath over several days.  He also has been having some headache.  He is not have any vomiting or diarrhea.  No current chest pain.  No increasing leg swelling  Prior to Admission medications   Medication Sig Start Date End Date Taking? Authorizing Provider  albuterol  (VENTOLIN  HFA) 108 (90 Base) MCG/ACT inhaler Inhale 1 puff into the lungs every 6 (six) hours as needed for wheezing or shortness of breath. 06/12/23   Rolan Ezra RAMAN, MD  amiodarone  (PACERONE ) 200 MG tablet Take 1 tablet (200 mg total) by mouth 2 (two) times daily. 12/14/23   Rolan Ezra RAMAN, MD  Ascorbic Acid (VITAMIN C PO) Take 1 tablet by mouth daily.    [provider]  atorvastatin  (LIPITOR ) 80 MG tablet TAKE 1 TABLET EVERY EVENING 03/19/24   [name], Dalton S, MD  metoprolol  succinate (TOPROL -XL) 50 MG 24 hr tablet Take 1 tablet (50 mg total) by mouth daily. 01/22/24   Rolan Ezra RAMAN, MD  Multiple Vitamins-Minerals (MULTIVITAMIN WITH MINERALS) tablet Take 1 tablet by mouth daily.    [provider]  oxyCODONE  (ROXICODONE ) 5 MG immediate release tablet Take 1 tablet (5 mg total) by mouth every 4 (four) hours as  needed for up to 10 doses. 06/04/24   Hoy Nidia FALCON, PA-C  potassium chloride  SA (KLOR-CON  M) 20 MEQ tablet TAKE 1 TABLET EVERY DAY 06/04/24   Lesia Ozell Barter, PA-C  sacubitril -valsartan  (ENTRESTO ) 49-51 MG Take 1 tablet by mouth 2 (two) times daily. 01/03/24   [name], Toribio SAUNDERS, MD  sotalol  (BETAPACE ) 80 MG tablet Take 1 tablet (80 mg total) by mouth 2 (two) times daily. 02/21/24   Lesia Ozell Barter, PA-C  spironolactone  (ALDACTONE ) 25 MG tablet TAKE 1 TABLET EVERY DAY 06/10/24   [name], Dalton S, MD  torsemide  (DEMADEX ) 20 MG tablet TAKE 3 TABLETS EVERY MORNING AND TAKE 2 TABLETS EVERY EVENING 06/10/24   [name], Dalton S, MD  XARELTO  20 MG TABS tablet TAKE 1 TABLET DAILY WITH SUPPER. 03/17/24   [name], Dalton S, MD    Allergies: Sglt2 inhibitors    Review of Systems  Respiratory:  Positive for shortness of breath.     Updated Vital Signs BP 91/76   Pulse 81   Temp 98 F (36.7 C) (Oral)   Resp 20   Ht 1.93 m (6' 4)   Wt (!) 154.2 kg   SpO2 94%   BMI 41.39 kg/m   Physical Exam Vitals and nursing note reviewed.  Constitutional:      Appearance: He is well-developed. He is ill-appearing.  HENT:     Head: Normocephalic and atraumatic.     Right  Ear: External ear normal.     Left Ear: External ear normal.  Eyes:     General: No scleral icterus.       Right eye: No discharge.        Left eye: No discharge.     Conjunctiva/sclera: Conjunctivae normal.  Neck:     Trachea: No tracheal deviation.  Cardiovascular:     Rate and Rhythm: Normal rate and regular rhythm.  Pulmonary:     Effort: Pulmonary effort is normal. No respiratory distress.     Breath sounds: Normal breath sounds. No stridor. No wheezing, rhonchi or rales.  Abdominal:     General: Bowel sounds are normal. There is no distension.     Palpations: Abdomen is soft.     Tenderness: There is no abdominal tenderness. There is no guarding or rebound.  Musculoskeletal:        General: No tenderness  or deformity.     Cervical back: Neck supple.     Right lower leg: Edema present.     Left lower leg: Edema present.  Skin:    General: Skin is warm and dry.     Findings: No rash.  Neurological:     General: No focal deficit present.     Mental Status: He is alert.     Cranial Nerves: No cranial nerve deficit, dysarthria or facial asymmetry.     Sensory: No sensory deficit.     Motor: No abnormal muscle tone or seizure activity.     Coordination: Coordination normal.  Psychiatric:        Mood and Affect: Mood normal.     (all labs ordered are listed, but only abnormal results are displayed) Labs Reviewed  CBC - Abnormal; Notable for the following components:      Result Value   WBC 10.8 (*)    RBC 2.84 (*)    Hemoglobin 8.8 (*)    HCT 28.9 (*)    MCV 101.8 (*)    All other components within normal limits  COMPREHENSIVE METABOLIC PANEL WITH GFR - Abnormal; Notable for the following components:   Sodium 130 (*)    Potassium 5.6 (*)    Chloride 94 (*)    Glucose, Bld 139 (*)    BUN 73 (*)    Creatinine, Ser 4.47 (*)    GFR, Estimated 14 (*)    All other components within normal limits  PRO BRAIN NATRIURETIC PEPTIDE - Abnormal; Notable for the following components:   Pro Brain Natriuretic Peptide 2,884.0 (*)    All other components within normal limits  TROPONIN T, HIGH SENSITIVITY - Abnormal; Notable for the following components:   Troponin T High Sensitivity 37 (*)    All other components within normal limits  RESP PANEL BY RT-PCR (RSV, FLU A&B, COVID)  RVPGX2  CULTURE, BLOOD (ROUTINE X 2)  CULTURE, BLOOD (ROUTINE X 2)  URINALYSIS, W/ REFLEX TO CULTURE (INFECTION SUSPECTED)  I-STAT CG4 LACTIC ACID, ED  I-STAT CG4 LACTIC ACID, ED  TROPONIN T, HIGH SENSITIVITY    EKG: None  Radiology: DG Chest Portable 1 View Result Date: 06/13/2024 EXAM: 1 VIEW(S) XRAY OF THE CHEST 06/13/2024 03:32:00 PM COMPARISON: 06/04/2024 CLINICAL HISTORY: SOB and hypotension. FINDINGS:  LINES, TUBES AND DEVICES: AICD noted, ventricular lead tip is obscured by underpenetration. LUNGS AND PLEURA: Low lung volumes are present, causing crowding of the pulmonary vasculature. No focal pulmonary opacity. No pulmonary edema. No pleural effusion. No pneumothorax. No consolidation. HEART AND MEDIASTINUM: Moderate  cardiomegaly. Cardiomegaly and mediastinal contours stable. BONES AND SOFT TISSUES: No acute osseous abnormality. LIMITATIONS/ARTIFACTS: The frontal projection is rotated to the right, reducing diagnostic sensitivity and specificity. IMPRESSION: 1. No acute cardiopulmonary abnormality. 2. Moderate cardiomegaly, stable. 3. AICD in place; ventricular lead tip not well visualized due to underpenetration. 4. Low lung volumes. Electronically signed by: Ryan Salvage MD 06/13/2024 04:01 PM EDT RP Workstation: [id number]     .Critical Care  Performed by: Randol Simmonds, MD Authorized by: Randol Simmonds, MD   Critical care provider statement:    Critical care time (minutes):  30   Critical care was time spent personally by me on the following activities:  Development of treatment plan with patient or surrogate, discussions with consultants, evaluation of patient's response to treatment, examination of patient, ordering and review of laboratory studies, ordering and review of radiographic studies, ordering and performing treatments and interventions, pulse oximetry, re-evaluation of patient's condition and review of old charts    Medications Ordered in the ED  acetaminophen  (TYLENOL ) tablet 975 mg (975 mg Oral Given 06/13/24 1527)  ondansetron  (ZOFRAN ) injection 4 mg (4 mg Intravenous Given 06/13/24 1644)  lactated ringers  bolus 500 mL (500 mLs Intravenous New Bag/Given 06/13/24 1631)    Clinical Course as of 06/13/24 1710  Fri Jun 13, 2024  1454 Blood pressure 80s over 60s at the bedside [JK]  1554 Blood pressure ranges 80s over 60s at bedside.  Discussed these findings with patient and wife.   They state normally his blood pressure runs in the 80s and 90s systolic [JK]  1556 Previous blood pressure readings reviewed.  Patient has been in the 90s low 100s previously [JK]  1625 Chest x-ray does not show acute abnormality [JK]  1627 Blood pressure improving at 91/76 [JK]  1650 Pro Brain natriuretic peptide(!) BNP elevated 2884 [JK]  1650 Comprehensive metabolic panel(!) Metabolic panel shows elevated BUN and creatinine of 73 and 4.47 [JK]  1651 CBC(!) Hemoglobin decreased compared to recent [JK]  1651 I-Stat CG4 Lactic Acid Lactic acid normal normal [JK]  1651 Blood pressure increasing at 91/76 [JK]  1706 Case discussed with Roxane.  Will plan on admission to [facility] [JK]    Clinical Course User Index [JK] Randol Simmonds, MD                                 Medical Decision Making Problems Addressed: AKI (acute kidney injury): acute illness or injury that poses a threat to life or bodily functions Congestive heart failure, unspecified HF chronicity, unspecified heart failure type [facility]): acute illness or injury that poses a threat to life or bodily functions  Amount and/or Complexity of Data Reviewed Labs: ordered. Decision-making details documented in ED Course. Radiology: ordered and independent interpretation performed.  Risk OTC drugs. Prescription drug management.   Patient presents to the ED with complaints of hypertension and shortness of breath.  Patient initially denied any chest pain or abdominal pain.  He however has been complaining of headache as well.  Patient notably hypotensive.  Very low ejection fraction at baseline.  Concerned about the possibility of cardiogenic shock sepsis, dehydration.  Initial labs do not show any signs of lactic acidosis.  Patient was given small fluid bolus for persistent covid more towards baseline.  Labs notable for AKI with increased BUN/creatinine now 73 and 4.47.  Will obtain bladder scan.  Will check urine.  Etiology of AKI  unclear.  He does  not have any symptoms to suggest dehydration.  It is possible but it could be related to his CHF and ischemic cardiomyopathy if he has been having hypotension recently.  Will make sure is not obstructive etiology with the bladder scan  Will consult with medical service for admission.  Will also consult cardiology   Cardiology consult pending at shift change.  Dr Patsey will follow on that call     Final diagnoses:  Hypotension, unspecified hypotension type  Congestive heart failure, unspecified HF chronicity, unspecified heart failure type (HCC)  AKI (acute kidney injury)    ED Discharge Orders     None          Randol Simmonds, MD 06/13/24 1710

## 2024-06-13 NOTE — Assessment & Plan Note (Signed)
 BP Readings from Last 3 Encounters:  06/13/24 91/76  06/13/24 (!) 76/50  06/06/24 (!) 91/59    Blood pressure as low as 68/74 mmHg, likely due to medication mismanagement and fluid status changes. - Adjust medications based on blood pressure readings.

## 2024-06-13 NOTE — Patient Instructions (Signed)
   Thanks for choosing [facility] we consider it a privelige to serve you.  

## 2024-06-13 NOTE — Assessment & Plan Note (Signed)
  Worsening since Sunday, associated with hypoxemia. - Transfer to emergency  room  on 2 ltrs via nasal cannula for further evaluation and management.

## 2024-06-13 NOTE — ED Triage Notes (Signed)
 Last Saturday pacemaker went off. Tuesday was seen in the ER for SOB, HA, nausea.  Currently c/o HA, SOB, generally feeling unwell. Denies CP. Patient speaking in short sentences. Increased weight gain.

## 2024-06-13 NOTE — Assessment & Plan Note (Signed)
 Heart failure with reduced ejection fraction with acute exacerbation and hypoxemia Possible Acute exacerbation with hypoxemia and low blood pressure, possibly due to medication mismanagement and fluid status changes. - Transfer to emergency room for further evaluation and management. - Monitor blood pressure closely. - Adjust medications based on blood pressure readings.

## 2024-06-13 NOTE — H&P (Signed)
 History and Physical  Jamil Armwood FMW:[id number] DOB: [birth year] DOA: 06/13/2024  PCP: Lorren Greig PARAS, NP   Chief Complaint: Shortness of breath, nausea  HPI: [name] [name] is a 63 y.o. male with medical history significant for congestive heart failure with reduced EF 20%, severely dilated LV, nonischemic cardiomyopathy, recurrent VT being admitted to the hospital with several days of progressive shortness of breath, dyspnea, nausea and weight gain found to be hypotensive and with acute on chronic kidney disease.  History is provided by the patient as well as his wife who is at the bedside, he states he has not been feeling well for the last week or so.  He presented to the emergency department on 9/17 was evaluated with a chief complaint of chest pain and shortness of breath.  Just prior to that, he had ICD shock.  Workup in the emergency department was relatively benign, labs and EKG were noted to be at baseline.  He was discharged home in stable condition, followed up with his electrophysiologist in the clinic on 9/19.  Patient was felt to be stable without any acute processes, plan was to continue his current medical therapy, with routine follow-up.  Patient states that since that clinic visit for the last several days, he has been having progressive shortness of breath, nausea, anorexia, has a severe bilateral temporal headache, and feels short of breath.  Patient states he does not check his weight on a regular basis, but here his weight today is up about 10 pounds from when it was last checked.  He admits to some chronic lower extremity edema, but on further questioning states that it might be a little bit worse today than at baseline.  On initial evaluation in the emergency department, patient was found to be quite hypotensive with initial blood pressure 68/48, he was given a 500 cc fluid bolus and blood pressure is now improved to 91/76.  Review of Systems: Please see HPI for pertinent positives  and negatives. A complete 10 system review of systems are otherwise negative.  Past Medical History:  Diagnosis Date   AICD (automatic cardioverter/defibrillator) present    Arthritis    Atrial fibrillation (HCC)    CAD (coronary artery disease)    Mild nonobstructive (Cath 09)   Chronic systolic heart failure (HCC)    Nonischemic CM: echo 4/12 with EF 25-30%, grade 2 diast dysfxn, mild dilated aortic root 43 mm, trivial MR, mod LAE   COPD (chronic obstructive pulmonary disease) (HCC)    Dyspnea    with exertion    Ejection fraction < 50% 08/2019   20-25% noted on ECHO   H/O ETOH abuse    HTN (hypertension)    Hypersomnia    Nonischemic cardiomyopathy (HCC)    Obesity, morbid (HCC)    OSA on CPAP    Systolic and diastolic CHF, acute on chronic (HCC) 11/2016   Tobacco use disorder    Ventricular tachycardia (HCC)    Past Surgical History:  Procedure Laterality Date   CARDIAC CATHETERIZATION N/A 04/08/2015   Procedure: Right/Left Heart Cath and Coronary Angiography;  Surgeon: Candyce GORMAN Reek, MD;  Location: [facility] INVASIVE CV LAB;  Service: Cardiovascular;  Laterality: N/A;   EP IMPLANTABLE DEVICE N/A 04/14/2015   Procedure: BiV ICD Insertion CRT-D;  Surgeon: Elspeth JAYSON Sage, MD;  Location: [facility] INVASIVE CV LAB;  Service: Cardiovascular;  Laterality: N/A;   HYDROCELE EXCISION / REPAIR     ICD GENERATOR CHANGEOUT N/A 02/20/2022   Procedure: ICD GENERATOR CHANGEOUT;  Surgeon: Fernande Elspeth BROCKS, MD;  Location: [facility] INVASIVE CV LAB;  Service: Cardiovascular;  Laterality: N/A;   ICD IMPLANT     RIGHT HEART CATH N/A 01/31/2024   Procedure: RIGHT HEART CATH;  Surgeon: Rolan Ezra RAMAN, MD;  Location: [facility] INVASIVE CV LAB;  Service: Cardiovascular;  Laterality: N/A;   RIGHT/LEFT HEART CATH AND CORONARY ANGIOGRAPHY N/A 06/07/2023   Procedure: RIGHT/LEFT HEART CATH AND CORONARY ANGIOGRAPHY;  Surgeon: Rolan Ezra RAMAN, MD;  Location: [facility] INVASIVE CV LAB;  Service: Cardiovascular;  Laterality: N/A;   TOTAL  HIP ARTHROPLASTY Right 11/04/2018   Procedure: TOTAL HIP ARTHROPLASTY ANTERIOR APPROACH;  Surgeon: Yvone Rush, MD;  Location: [facility] ORS;  Service: Orthopedics;  Laterality: Right;   TOTAL KNEE ARTHROPLASTY Right 01/30/2020   Procedure: TOTAL KNEE ARTHROPLASTY;  Surgeon: Yvone Rush, MD;  Location: [facility] ORS;  Service: Orthopedics;  Laterality: Right;   TOTAL KNEE ARTHROPLASTY Left 07/30/2020   Procedure: TOTAL KNEE ARTHROPLASTY;  Surgeon: Yvone Rush, MD;  Location: [facility] OR[facility];  Service: Orthopedics;  Laterality: Left;   TUMOR EXCISION Left    TUMOR EXCISION Left 1982   Benign tumor removed from L leg   VASECTOMY     Social History:  reports that he quit smoking about 18 years ago. His smoking use included cigarettes. He started smoking about 22 years ago. He has a 2 pack-year smoking history. He has never used smokeless tobacco. He reports current alcohol use. He reports current drug use. Drug: Marijuana.  Allergies  Allergen Reactions   Sglt2 Inhibitors Other (See Comments)    Recurrent UTI    Family History  Problem Relation Age of Onset   Other Mother        cardiac surgery. late 1990s   Heart Problems Mother        CABG AGE 51   Congestive Heart Failure Father    Healthy Father        AGE 54   Coronary artery disease Other    Healthy Brother        AGE 25   Healthy Sister        AGE 75   Healthy Sister        AGE 55   Healthy Son    Healthy Son    Healthy Daughter      Prior to Admission medications   Medication Sig Start Date End Date Taking? Authorizing Provider  albuterol  (VENTOLIN  HFA) 108 (90 Base) MCG/ACT inhaler Inhale 1 puff into the lungs every 6 (six) hours as needed for wheezing or shortness of breath. 06/12/23   Rolan Ezra RAMAN, MD  amiodarone  (PACERONE ) 200 MG tablet Take 1 tablet (200 mg total) by mouth 2 (two) times daily. 12/14/23   Rolan Ezra RAMAN, MD  Ascorbic Acid (VITAMIN C PO) Take 1 tablet by mouth daily.    [provider]  atorvastatin   (LIPITOR ) 80 MG tablet TAKE 1 TABLET EVERY EVENING 03/19/24   Rolan Ezra RAMAN, MD  metoprolol  succinate (TOPROL -XL) 50 MG 24 hr tablet Take 1 tablet (50 mg total) by mouth daily. 01/22/24   Rolan Ezra RAMAN, MD  Multiple Vitamins-Minerals (MULTIVITAMIN WITH MINERALS) tablet Take 1 tablet by mouth daily.    [provider]  oxyCODONE  (ROXICODONE ) 5 MG immediate release tablet Take 1 tablet (5 mg total) by mouth every 4 (four) hours as needed for up to 10 doses. 06/04/24   Hoy Nidia FALCON, PA-C  potassium chloride  SA (KLOR-CON  M) 20 MEQ tablet TAKE 1 TABLET EVERY DAY  06/04/24   Lesia Ozell Barter, PA-C  sacubitril -valsartan  (ENTRESTO ) 49-51 MG Take 1 tablet by mouth 2 (two) times daily. 01/03/24   [name], Toribio SAUNDERS, MD  sotalol  (BETAPACE ) 80 MG tablet Take 1 tablet (80 mg total) by mouth 2 (two) times daily. 02/21/24   Lesia Ozell Barter, PA-C  spironolactone  (ALDACTONE ) 25 MG tablet TAKE 1 TABLET EVERY DAY 06/10/24   Rolan Ezra RAMAN, MD  torsemide  (DEMADEX ) 20 MG tablet TAKE 3 TABLETS EVERY MORNING AND TAKE 2 TABLETS EVERY EVENING 06/10/24   [name], Dalton S, MD  XARELTO  20 MG TABS tablet TAKE 1 TABLET DAILY WITH SUPPER. 03/17/24   Rolan Ezra RAMAN, MD    Physical Exam: BP 91/76   Pulse 81   Temp 98 F (36.7 C) (Oral)   Resp 20   Ht 6' 4 (1.93 m)   Wt (!) 154.2 kg   SpO2 94%   BMI 41.39 kg/m  General:  Alert, oriented, calm, in mild distress due to his headache.  Able to speak in full sentences, no obvious dyspnea.  His wife is at the bedside, patient is wearing 2 L nasal cannula oxygen.  Overall patient appears to be moderately volume overloaded. Cardiovascular: RRR, no murmurs or rubs, he has 1-2+ pitting edema in the bilateral lower extremities up to the knees Respiratory: clear to auscultation bilaterally but breath sounds are diminished on the left side, no wheezes, no crackles  Abdomen: soft, nontender, nondistended, normal bowel tones heard  Skin: dry, no rashes   Musculoskeletal: no joint effusions, normal range of motion  Psychiatric: appropriate affect, normal speech  Neurologic: extraocular muscles intact, clear speech, moving all extremities with intact sensorium         Labs on Admission:  Basic Metabolic Panel: Recent Labs  Lab 06/13/24 1435  NA 130*  K 5.6*  CL 94*  CO2 22  GLUCOSE 139*  BUN 73*  CREATININE 4.47*  CALCIUM  9.2   Liver Function Tests: Recent Labs  Lab 06/13/24 1435  AST 15  ALT 27  ALKPHOS 112  BILITOT 0.3  PROT 6.8  ALBUMIN  3.8   No results for input(s): LIPASE, AMYLASE in the last 168 hours. No results for input(s): AMMONIA in the last 168 hours. CBC: Recent Labs  Lab 06/13/24 1435  WBC 10.8*  HGB 8.8*  HCT 28.9*  MCV 101.8*  PLT 355   Cardiac Enzymes: No results for input(s): CKTOTAL, CKMB, CKMBINDEX, TROPONINI in the last 168 hours. BNP (last 3 results) Recent Labs    03/26/24 1413 04/28/24 1023 06/04/24 0204  BNP 326.3* 611.9* 219.1*    ProBNP (last 3 results) Recent Labs    06/13/24 1435  PROBNP 2,884.0*    CBG: No results for input(s): GLUCAP in the last 168 hours.  Radiological Exams on Admission: DG Chest Portable 1 View Result Date: 06/13/2024 EXAM: 1 VIEW(S) XRAY OF THE CHEST 06/13/2024 03:32:00 PM COMPARISON: 06/04/2024 CLINICAL HISTORY: SOB and hypotension. FINDINGS: LINES, TUBES AND DEVICES: AICD noted, ventricular lead tip is obscured by underpenetration. LUNGS AND PLEURA: Low lung volumes are present, causing crowding of the pulmonary vasculature. No focal pulmonary opacity. No pulmonary edema. No pleural effusion. No pneumothorax. No consolidation. HEART AND MEDIASTINUM: Moderate cardiomegaly. Cardiomegaly and mediastinal contours stable. BONES AND SOFT TISSUES: No acute osseous abnormality. LIMITATIONS/ARTIFACTS: The frontal projection is rotated to the right, reducing diagnostic sensitivity and specificity. IMPRESSION: 1. No acute cardiopulmonary  abnormality. 2. Moderate cardiomegaly, stable. 3. AICD in place; ventricular lead tip not well visualized due  to underpenetration. 4. Low lung volumes. Electronically signed by: Ryan Salvage MD 06/13/2024 04:01 PM EDT RP Workstation: [id number]   Assessment/Plan Nickalous Jaffer is a 63 y.o. male with medical history significant for congestive heart failure with reduced EF 20%, severely dilated LV, nonischemic cardiomyopathy, recurrent VT being admitted to the hospital with several days of progressive shortness of breath, dyspnea, nausea and weight gain found to be hypotensive and with acute on chronic kidney disease.  Patient's overall volume status is difficult to ascertain, though I suspect he is whole body volume overloaded, and intravascularly dry.  This would explain his acute kidney injury and hypotension.  He may have some abdominal congestion causing his anorexia and abdominal discomfort.  Hypotension, acute on CKD stage III-suspect this is due to third spacing and intravascular depletion.  Will hold antihypertensives and nephrotoxins. -Inpatient admission to cardiac telemetry -Given complexity of his cardiac history, will admit to [facility] -Blood pressure has improved with 500 cc NS bolus, low threshold for repeating bolus in case of recurrent hypotension, or may consider pressors if persistent -ER provider has consulted cardiology  Hyperkalemia-without acute EKG changes, though due to paced rhythm difficult to interpret. -Lokelma  x 1 now -Recheck electrolytes in the morning  Congestive heart failure with reduced EF-last known EF 20%, severely dilated LV, suspected nonischemic cardiomyopathy -Patient given 500 cc fluid bolus due to hypotension as above -Holding home metoprolol , sotalol , Entresto , torsemide , Aldactone  due to hypotension and AKI -Await further recommendations from cardiology  Atrial fibrillation-continue amiodarone  once dosing is reconciled  Hyperlipidemia-Lipitor   DVT  prophylaxis: Subcutaneous heparin     Code Status: Full Code  Consults called: ER provider is consulting inpatient cardiology.  Admission status: The appropriate patient status for this patient is INPATIENT. Inpatient status is judged to be reasonable and necessary in order to provide the required intensity of service to ensure the patient's safety. The patient's presenting symptoms, physical exam findings, and initial radiographic and laboratory data in the context of their chronic comorbidities is felt to place them at high risk for further clinical deterioration. Furthermore, it is not anticipated that the patient will be medically stable for discharge from the hospital within 2 midnights of admission.    I certify that at the point of admission it is my clinical judgment that the patient will require inpatient hospital care spanning beyond 2 midnights from the point of admission due to high intensity of service, high risk for further deterioration and high frequency of surveillance required  Time spent: 65 minutes  Braniya Farrugia CHRISTELLA Gail MD [facility] Pager [phone]  If 7PM-7AM, please contact night-coverage [email or web address] Password [email or web address]  06/13/2024, 5:38 PM

## 2024-06-13 NOTE — Progress Notes (Signed)
 Acute Office Visit  Subjective:     Patient ID: [name] [name], male    DOB: 05-28-1961, 63 y.o.   MRN: [id number]  Chief Complaint  Patient presents with   Shortness of Breath    Since sunday    Shortness of Breath    Discussed the use of AI scribe software for clinical note transcription with the patient, who gave verbal consent to proceed.  History of Present Illness [name] [name] is a 63 year old male  has a past medical history of AICD (automatic cardioverter/defibrillator) present, Arthritis, Atrial fibrillation (HCC), CAD (coronary artery disease), Chronic systolic heart failure (HCC), COPD (chronic obstructive pulmonary disease) (HCC), Dyspnea, Ejection fraction < 50% (08/2019), H/O ETOH abuse, HTN (hypertension), Hypersomnia, Nonischemic cardiomyopathy (HCC), Obesity, morbid (HCC), OSA on CPAP, Systolic and diastolic CHF, acute on chronic (HCC) (11/2016), Tobacco use disorder, and Ventricular tachycardia (HCC).   who presents with worsening shortness of breath and difficulty breathing.  He has been experiencing worsening shortness of breath since Sunday, which has been persistent but has recently intensified. His oxygen levels in the office today  was fluctuating between 88% and 90% on room air . The patient does not report any noticeable swelling in his legs, stomach, or feet. He denies chest pain.  He has a history of chronic heart failure and has been taking his medications, although he was taking amiodarone  200 mg three times a day instead of the prescribed twice daily. He was not taking metoprolol  at all but has since corrected this. He is currently taking Entresto  one tablet twice daily, spironolactone  25 mg daily, and torsemide     He reports a headache for the past few days, which he describes as originating from his eyes, and has been taking Tylenol  for relief. The patient reports pain in his eyes but denies swelling.  He mentions difficulty sleeping and eating, with  unexpected weight gain despite reduced food intake. He also notes a low blood pressure reading of 86/51, similar to his morning reading, and a previous reading of 68/74 last night.  He recalls a recent emergency room visit where a chest x-ray was performed to rule out a blood clot, but no significant findings were reported. He received morphine  for pain during that visit, which he states made him feel worse.     Assessment & Plan      Review of Systems  Respiratory:  Positive for shortness of breath.         Objective:    BP (!) 76/50   Pulse 82   Temp (!) 97 F (36.1 C)   Wt (!) 340 lb (154.2 kg)   SpO2 97%   BMI 41.39 kg/m    Physical Exam Vitals and nursing note reviewed.  Constitutional:      General: He is in acute distress.     Appearance: Normal appearance. He is obese. He is not ill-appearing or diaphoretic.  HENT:     Mouth/Throat:     Mouth: Mucous membranes are moist.     Pharynx: Oropharynx is clear. No oropharyngeal exudate or posterior oropharyngeal erythema.  Eyes:     General: No scleral icterus.       Right eye: No discharge.        Left eye: No discharge.     Extraocular Movements: Extraocular movements intact.     Conjunctiva/sclera: Conjunctivae normal.  Cardiovascular:     Rate and Rhythm: Normal rate and regular rhythm.     Pulses: Normal pulses.  Heart sounds: Normal heart sounds. No murmur heard.    No friction rub. No gallop.  Pulmonary:     Effort: Pulmonary effort is normal. No respiratory distress.     Breath sounds: Normal breath sounds. No stridor. No wheezing, rhonchi or rales.  Chest:     Chest wall: No tenderness.  Abdominal:     General: There is no distension.     Palpations: Abdomen is soft.     Tenderness: There is no abdominal tenderness. There is no right CVA tenderness, left CVA tenderness or guarding.  Musculoskeletal:        General: No swelling, tenderness, deformity or signs of injury.     Right lower leg:  Edema present.     Left lower leg: Edema present.  Skin:    General: Skin is warm and dry.     Capillary Refill: Capillary refill takes 2 to 3 seconds.     Coloration: Skin is not jaundiced or pale.     Findings: No bruising, erythema or lesion.  Neurological:     Mental Status: He is alert and oriented to person, place, and time.     Comments: Sitting in a chair  Psychiatric:        Mood and Affect: Mood normal.        Behavior: Behavior normal.        Thought Content: Thought content normal.        Judgment: Judgment normal.     Results for orders placed or performed during the hospital encounter of 06/13/24  Resp panel by RT-PCR (RSV, Flu A&B, Covid) Anterior Nasal Swab   Specimen: Anterior Nasal Swab  Result Value Ref Range   SARS Coronavirus 2 by RT PCR NEGATIVE NEGATIVE   Influenza A by PCR NEGATIVE NEGATIVE   Influenza B by PCR NEGATIVE NEGATIVE   Resp Syncytial Virus by PCR NEGATIVE NEGATIVE  CBC  Result Value Ref Range   WBC 10.8 (H) 4.0 - 10.5 K/uL   RBC 2.84 (L) 4.22 - 5.81 MIL/uL   Hemoglobin 8.8 (L) 13.0 - 17.0 g/dL   HCT 71.0 (L) 60.9 - 47.9 %   MCV 101.8 (H) 80.0 - 100.0 fL   MCH 31.0 26.0 - 34.0 pg   MCHC 30.4 30.0 - 36.0 g/dL   RDW 85.3 88.4 - 84.4 %   Platelets 355 150 - 400 K/uL   nRBC 0.0 0.0 - 0.2 %  Comprehensive metabolic panel  Result Value Ref Range   Sodium 130 (L) 135 - 145 mmol/L   Potassium 5.6 (H) 3.5 - 5.1 mmol/L   Chloride 94 (L) 98 - 111 mmol/L   CO2 22 22 - 32 mmol/L   Glucose, Bld 139 (H) 70 - 99 mg/dL   BUN 73 (H) 8 - 23 mg/dL   Creatinine, Ser 5.52 (H) 0.61 - 1.24 mg/dL   Calcium  9.2 8.9 - 10.3 mg/dL   Total Protein 6.8 6.5 - 8.1 g/dL   Albumin  3.8 3.5 - 5.0 g/dL   AST 15 15 - 41 U/L   ALT 27 0 - 44 U/L   Alkaline Phosphatase 112 38 - 126 U/L   Total Bilirubin 0.3 0.0 - 1.2 mg/dL   GFR, Estimated 14 (L) >60 mL/min   Anion gap 14 5 - 15  Pro Brain natriuretic peptide  Result Value Ref Range   Pro Brain Natriuretic Peptide  2,884.0 (H) <300.0 pg/mL  I-Stat CG4 Lactic Acid  Result Value Ref Range   Lactic Acid,  Venous 1.1 0.5 - 1.9 mmol/L  Troponin T, High Sensitivity  Result Value Ref Range   Troponin T High Sensitivity 37 (H) 0 - 19 ng/L  Troponin T, High Sensitivity  Result Value Ref Range   Troponin T High Sensitivity 46 (H) 0 - 19 ng/L        Assessment & Plan:   Problem List Items Addressed This Visit       Cardiovascular and Mediastinum   Chronic systolic heart failure (HCC) (Chronic)   Heart failure with reduced ejection fraction with acute exacerbation and hypoxemia Possible Acute exacerbation with hypoxemia and low blood pressure, possibly due to medication mismanagement and fluid status changes. - Transfer to emergency room for further evaluation and management. - Monitor blood pressure closely. - Adjust medications based on blood pressure readings.          Hypotension, unspecified   BP Readings from Last 3 Encounters:  06/13/24 91/76  06/13/24 (!) 76/50  06/06/24 (!) 91/59    Blood pressure as low as 68/74 mmHg, likely due to medication mismanagement and fluid status changes. - Adjust medications based on blood pressure readings.         Respiratory   Acute respiratory failure with hypoxia (HCC)     Other   Shortness of breath - Primary    Worsening since Sunday, associated with hypoxemia. - Transfer to emergency  room  on 2 ltrs via nasal cannula for further evaluation and management.       No orders of the defined types were placed in this encounter.   No follow-ups on file.  Adanya Sosinski R Imogen Maddalena, FNP

## 2024-06-14 ENCOUNTER — Other Ambulatory Visit: Payer: Self-pay

## 2024-06-14 ENCOUNTER — Encounter (HOSPITAL_COMMUNITY): Admission: EM | Disposition: E | Payer: Self-pay | Source: Ambulatory Visit | Attending: Internal Medicine

## 2024-06-14 ENCOUNTER — Encounter (HOSPITAL_COMMUNITY): Payer: Self-pay | Admitting: Internal Medicine

## 2024-06-14 DIAGNOSIS — I959 Hypotension, unspecified: Secondary | ICD-10-CM | POA: Diagnosis not present

## 2024-06-14 DIAGNOSIS — I420 Dilated cardiomyopathy: Secondary | ICD-10-CM

## 2024-06-14 DIAGNOSIS — I272 Pulmonary hypertension, unspecified: Secondary | ICD-10-CM

## 2024-06-14 DIAGNOSIS — N183 Chronic kidney disease, stage 3 unspecified: Secondary | ICD-10-CM | POA: Diagnosis not present

## 2024-06-14 DIAGNOSIS — I480 Paroxysmal atrial fibrillation: Secondary | ICD-10-CM

## 2024-06-14 DIAGNOSIS — I5021 Acute systolic (congestive) heart failure: Secondary | ICD-10-CM | POA: Diagnosis not present

## 2024-06-14 DIAGNOSIS — N179 Acute kidney failure, unspecified: Secondary | ICD-10-CM

## 2024-06-14 DIAGNOSIS — G4733 Obstructive sleep apnea (adult) (pediatric): Secondary | ICD-10-CM

## 2024-06-14 DIAGNOSIS — I5023 Acute on chronic systolic (congestive) heart failure: Secondary | ICD-10-CM

## 2024-06-14 DIAGNOSIS — R570 Cardiogenic shock: Secondary | ICD-10-CM | POA: Diagnosis present

## 2024-06-14 DIAGNOSIS — I509 Heart failure, unspecified: Secondary | ICD-10-CM | POA: Diagnosis not present

## 2024-06-14 HISTORY — PX: RIGHT HEART CATH: CATH118263

## 2024-06-14 LAB — CBC
HCT: 25.8 % — ABNORMAL LOW (ref 39.0–52.0)
Hemoglobin: 8.2 g/dL — ABNORMAL LOW (ref 13.0–17.0)
MCH: 32.3 pg (ref 26.0–34.0)
MCHC: 31.8 g/dL (ref 30.0–36.0)
MCV: 101.6 fL — ABNORMAL HIGH (ref 80.0–100.0)
Platelets: 306 K/uL (ref 150–400)
RBC: 2.54 MIL/uL — ABNORMAL LOW (ref 4.22–5.81)
RDW: 14.8 % (ref 11.5–15.5)
WBC: 9.7 K/uL (ref 4.0–10.5)
nRBC: 0 % (ref 0.0–0.2)

## 2024-06-14 LAB — POCT I-STAT EG7
Acid-base deficit: 2 mmol/L (ref 0.0–2.0)
Acid-base deficit: 1 mmol/L (ref 0.0–2.0)
Bicarbonate: 24.3 mmol/L (ref 20.0–28.0)
Bicarbonate: 25.5 mmol/L (ref 20.0–28.0)
Calcium, Ion: 1.18 mmol/L (ref 1.15–1.40)
Calcium, Ion: 1.2 mmol/L (ref 1.15–1.40)
HCT: 25 % — ABNORMAL LOW (ref 39.0–52.0)
HCT: 25 % — ABNORMAL LOW (ref 39.0–52.0)
Hemoglobin: 8.5 g/dL — ABNORMAL LOW (ref 13.0–17.0)
Hemoglobin: 8.5 g/dL — ABNORMAL LOW (ref 13.0–17.0)
O2 Saturation: 47 %
O2 Saturation: 42 %
Potassium: 5.7 mmol/L — ABNORMAL HIGH (ref 3.5–5.1)
Potassium: 5.8 mmol/L — ABNORMAL HIGH (ref 3.5–5.1)
Sodium: 132 mmol/L — ABNORMAL LOW (ref 135–145)
Sodium: 132 mmol/L — ABNORMAL LOW (ref 135–145)
TCO2: 26 mmol/L (ref 22–32)
TCO2: 27 mmol/L (ref 22–32)
pCO2, Ven: 51.1 mmHg (ref 44–60)
pCO2, Ven: 54.6 mmHg (ref 44–60)
pH, Ven: 7.285 (ref 7.25–7.43)
pH, Ven: 7.278 (ref 7.25–7.43)
pO2, Ven: 29 mmHg — CL (ref 32–45)
pO2, Ven: 27 mmHg — CL (ref 32–45)

## 2024-06-14 LAB — HIV ANTIBODY (ROUTINE TESTING W REFLEX): HIV Screen 4th Generation wRfx: NONREACTIVE

## 2024-06-14 LAB — BASIC METABOLIC PANEL WITH GFR
Anion gap: 13 (ref 5–15)
Anion gap: 11 (ref 5–15)
BUN: 80 mg/dL — ABNORMAL HIGH (ref 8–23)
BUN: 88 mg/dL — ABNORMAL HIGH (ref 8–23)
CO2: 19 mmol/L — ABNORMAL LOW (ref 22–32)
CO2: 22 mmol/L (ref 22–32)
Calcium: 8.1 mg/dL — ABNORMAL LOW (ref 8.9–10.3)
Calcium: 8.7 mg/dL — ABNORMAL LOW (ref 8.9–10.3)
Chloride: 100 mmol/L (ref 98–111)
Chloride: 98 mmol/L (ref 98–111)
Creatinine, Ser: 3.68 mg/dL — ABNORMAL HIGH (ref 0.61–1.24)
Creatinine, Ser: 3.82 mg/dL — ABNORMAL HIGH (ref 0.61–1.24)
GFR, Estimated: 18 mL/min — ABNORMAL LOW (ref >60)
GFR, Estimated: 17 mL/min — ABNORMAL LOW (ref >60)
Glucose, Bld: 135 mg/dL — ABNORMAL HIGH (ref 70–99)
Glucose, Bld: 162 mg/dL — ABNORMAL HIGH (ref 70–99)
Potassium: 5.5 mmol/L — ABNORMAL HIGH (ref 3.5–5.1)
Potassium: 5.5 mmol/L — ABNORMAL HIGH (ref 3.5–5.1)
Sodium: 132 mmol/L — ABNORMAL LOW (ref 135–145)
Sodium: 131 mmol/L — ABNORMAL LOW (ref 135–145)

## 2024-06-14 LAB — MRSA NEXT GEN BY PCR, NASAL: MRSA by PCR Next Gen: NOT DETECTED

## 2024-06-14 LAB — IRON AND TIBC
Iron: 50 ug/dL (ref 45–182)
Saturation Ratios: 16 % — ABNORMAL LOW (ref 17.9–39.5)
TIBC: 311 ug/dL (ref 250–450)
UIBC: 261 ug/dL

## 2024-06-14 LAB — CG4 I-STAT (LACTIC ACID): Lactic Acid, Venous: <0.3 mmol/L — ABNORMAL LOW (ref 0.5–1.9)

## 2024-06-14 LAB — FERRITIN: Ferritin: 683 ng/mL — ABNORMAL HIGH (ref 24–336)

## 2024-06-14 LAB — I-STAT CG4 LACTIC ACID, ED: Lactic Acid, Venous: 0.9 mmol/L (ref 0.5–1.9)

## 2024-06-14 SURGERY — RIGHT HEART CATH
Anesthesia: LOCAL

## 2024-06-14 MED ORDER — HEPARIN (PORCINE) IN NACL 1000-0.9 UT/500ML-% IV SOLN
INTRAVENOUS | Status: DC | PRN
  Administered 2024-06-14 (×2): 500 mL

## 2024-06-14 MED ORDER — FENTANYL CITRATE (PF) 100 MCG/2ML IJ SOLN
INTRAMUSCULAR | Status: AC
  Filled 2024-06-14: qty 2

## 2024-06-14 MED ORDER — LIDOCAINE HCL (PF) 1 % IJ SOLN
INTRAMUSCULAR | Status: DC | PRN
  Administered 2024-06-14 (×2): 5 mL via INTRADERMAL

## 2024-06-14 MED ORDER — FUROSEMIDE 10 MG/ML IJ SOLN
40.0000 mg | Freq: Once | INTRAMUSCULAR | Status: DC
Start: 2024-06-14 — End: 2024-06-15

## 2024-06-14 MED ORDER — AMIODARONE HCL IN DEXTROSE 360-4.14 MG/200ML-% IV SOLN
60.0000 mg/h | INTRAVENOUS | Status: DC
  Administered 2024-06-14 – 2024-06-17 (×5): 30 mg/h via INTRAVENOUS
  Administered 2024-06-17 – 2024-06-18 (×6): 60 mg/h via INTRAVENOUS
  Administered 2024-06-19: 30 mg/h via INTRAVENOUS
  Administered 2024-06-19 – 2024-06-20 (×3): 60 mg/h via INTRAVENOUS
  Administered 2024-06-20: 30 mg/h via INTRAVENOUS
  Administered 2024-06-21 – 2024-06-22 (×6): 60 mg/h via INTRAVENOUS
  Filled 2024-06-14: qty 400
  Filled 2024-06-14 (×2): qty 200
  Filled 2024-06-14: qty 400
  Filled 2024-06-14 (×19): qty 200

## 2024-06-14 MED ORDER — LIDOCAINE HCL (PF) 1 % IJ SOLN
INTRAMUSCULAR | Status: AC
  Filled 2024-06-14: qty 30

## 2024-06-14 MED ORDER — SODIUM CHLORIDE 0.9 % IV BOLUS
500.0000 mL | Freq: Once | INTRAVENOUS | Status: AC
  Administered 2024-06-14: 500 mL via INTRAVENOUS

## 2024-06-14 MED ORDER — SODIUM CHLORIDE 0.9% FLUSH: 3.0000 mL | Freq: Two times a day (BID) | INTRAVENOUS | Status: DC

## 2024-06-14 MED ORDER — MILRINONE LACTATE IN DEXTROSE 20-5 MG/100ML-% IV SOLN
0.1250 ug/kg/min | INTRAVENOUS | Status: DC
  Administered 2024-06-14: 0.125 ug/kg/min via INTRAVENOUS
  Administered 2024-06-15: 0.25 ug/kg/min via INTRAVENOUS
  Administered 2024-06-15: 0.125 ug/kg/min via INTRAVENOUS
  Administered 2024-06-16 – 2024-06-20 (×10): 0.25 ug/kg/min via INTRAVENOUS
  Filled 2024-06-14 (×15): qty 100

## 2024-06-14 MED ORDER — FUROSEMIDE 10 MG/ML IJ SOLN
30.0000 mg/h | INTRAVENOUS | Status: DC
  Administered 2024-06-14 (×2): 20 mg/h via INTRAVENOUS
  Administered 2024-06-15 – 2024-06-16 (×4): 30 mg/h via INTRAVENOUS
  Filled 2024-06-14 (×6): qty 20

## 2024-06-14 MED ORDER — FUROSEMIDE 10 MG/ML IJ SOLN: 80.0000 mg | Freq: Once | INTRAMUSCULAR | Status: DC

## 2024-06-14 MED ORDER — NOREPINEPHRINE 4 MG/250ML-% IV SOLN: 4.0000 ug/min | INTRAVENOUS | Status: DC

## 2024-06-14 MED ORDER — CHLORHEXIDINE GLUCONATE CLOTH 2 % EX PADS
6.0000 | MEDICATED_PAD | Freq: Every day | CUTANEOUS | Status: DC
  Administered 2024-06-14 – 2024-06-22 (×10): 6 via TOPICAL

## 2024-06-14 MED ORDER — HEPARIN (PORCINE) 25000 UT/250ML-% IV SOLN
2800.0000 [IU]/h | INTRAVENOUS | Status: DC
Start: 2024-06-14 — End: 2024-06-23
  Administered 2024-06-14: 1600 [IU]/h via INTRAVENOUS
  Administered 2024-06-16 (×2): 2450 [IU]/h via INTRAVENOUS
  Administered 2024-06-17 – 2024-06-22 (×11): 2800 [IU]/h via INTRAVENOUS
  Filled 2024-06-14 (×7): qty 250
  Filled 2024-06-14: qty 500
  Filled 2024-06-14 (×10): qty 250

## 2024-06-14 MED ORDER — METOLAZONE 5 MG PO TABS
5.0000 mg | ORAL_TABLET | Freq: Once | ORAL | Status: AC
  Administered 2024-06-14: 5 mg via ORAL
  Filled 2024-06-14 (×2): qty 1

## 2024-06-14 MED ORDER — SODIUM CHLORIDE 0.9 % IV SOLN
250.0000 mL | INTRAVENOUS | Status: AC
  Administered 2024-06-14: 250 mL via INTRAVENOUS

## 2024-06-14 MED ORDER — MIDAZOLAM HCL 2 MG/2ML IJ SOLN
INTRAMUSCULAR | Status: AC
  Filled 2024-06-14: qty 2

## 2024-06-14 MED ORDER — SODIUM ZIRCONIUM CYCLOSILICATE 10 G PO PACK
10.0000 g | PACK | Freq: Once | ORAL | Status: AC
  Administered 2024-06-14: 10 g via ORAL
  Filled 2024-06-14: qty 1

## 2024-06-14 MED ORDER — NOREPINEPHRINE 4 MG/250ML-% IV SOLN
INTRAVENOUS | Status: AC
  Administered 2024-06-14: 4 ug/min via INTRAVENOUS
  Filled 2024-06-14: qty 250

## 2024-06-14 MED ORDER — SODIUM CHLORIDE 0.9 % IV SOLN: 250.0000 mL | INTRAVENOUS | Status: DC | PRN

## 2024-06-14 MED ORDER — FUROSEMIDE 10 MG/ML IJ SOLN
INTRAMUSCULAR | Status: DC | PRN
  Administered 2024-06-14: 120 mg via INTRAVENOUS

## 2024-06-14 MED ORDER — MIDAZOLAM HCL 2 MG/2ML IJ SOLN
INTRAMUSCULAR | Status: DC | PRN
  Administered 2024-06-14: .5 mg via INTRAVENOUS

## 2024-06-14 MED ORDER — SODIUM CHLORIDE 0.9% FLUSH: 3.0000 mL | INTRAVENOUS | Status: DC | PRN

## 2024-06-14 MED ORDER — ORAL CARE MOUTH RINSE: 15.0000 mL | OROMUCOSAL | Status: DC | PRN

## 2024-06-14 MED ORDER — SODIUM BICARBONATE 8.4 % IV SOLN
INTRAVENOUS | Status: DC
  Filled 2024-06-14: qty 25

## 2024-06-14 MED ORDER — NOREPINEPHRINE 4 MG/250ML-% IV SOLN
0.0000 ug/min | INTRAVENOUS | Status: DC
  Administered 2024-06-14: 5 ug/min via INTRAVENOUS
  Administered 2024-06-15: 12 ug/min via INTRAVENOUS
  Administered 2024-06-15: 6 ug/min via INTRAVENOUS
  Administered 2024-06-15: 12 ug/min via INTRAVENOUS
  Administered 2024-06-16: 14 ug/min via INTRAVENOUS
  Administered 2024-06-16: 19 ug/min via INTRAVENOUS
  Filled 2024-06-14 (×6): qty 250

## 2024-06-14 SURGICAL SUPPLY — 11 items
CATH SWAN GANZ VIP 7.5F (CATHETERS) IMPLANT
GUIDEWIRE .025 260CM (WIRE) IMPLANT
KIT MICROPUNCTURE NIT STIFF (SHEATH) IMPLANT
MAT PREVALON FULL STRYKER (MISCELLANEOUS) IMPLANT
SHEATH PINNACLE 7F 10CM (SHEATH) IMPLANT
SHEATH PINNACLE 8F 10CM (SHEATH) IMPLANT
SHIELD CATH-GARD CONTAMINATION (MISCELLANEOUS) IMPLANT
TRANSDUCER W/STOPCOCK (MISCELLANEOUS) IMPLANT
TUBING ART PRESS 72 MALE/FEM (TUBING) IMPLANT
WIRE EMERALD 3MM-J .025X260CM (WIRE) IMPLANT
WIRE MICROINTRODUCER 60CM (WIRE) IMPLANT

## 2024-06-14 NOTE — ED Provider Notes (Signed)
 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  Dr. Davia wants to facilitate cardiology evaluation ASAP.   Laurice Maude BROCKS, MD 06/14/24 [phone]

## 2024-06-14 NOTE — ED Triage Notes (Signed)
 Received pt from [facility] via [facility]. Pt arrives hypotensive in the 60s MD Randol made aware and fluid bolus ordered. [facility] reports pt was 102 systolic when they picked him up at [facility] but became hypotensive in the 70s when they transferred him to the truck.

## 2024-06-14 NOTE — Consult Note (Addendum)
 NAMETyreon [name], MRN:  [id number], DOB:  1961/06/17, LOS: 1 ADMISSION DATE:  06/13/2024, CONSULTATION DATE:  06/14/2024 REFERRING MD:  TRH, CHIEF COMPLAINT:  Cardiogenic shock    History of Present Illness:  [name] [name] is a 63 y.o. male with a PMH significant for HFrEF, severely dilated LV, nonischemic cardiomyopathy, recurrent/sustained V. tach requiring episodic ICD shocks, presence of biventricular ICD, paroxysmal atrial fibrillation, CKD stage IIIb, OSA on BiPAP, HTN, anxiety, and depression who presented to the ED at [facility] 9/26 for complaints of shortness of breath and nausea. Workup on admission revealed decompensated CHF with concerns to progression of cardiogenic shock prompting PCCM and heart failure consult.   Pertinent  Medical History  HFrEF, severely dilated LV, nonischemic cardiomyopathy, recurrent/sustained V. tach requiring episodic ICD shocks, presence of biventricular ICD, paroxysmal atrial fibrillation, CKD stage IIIb, OSA on BiPAP, HTN, anxiety, and depression   Significant Hospital Events: Including procedures, antibiotic start and stop dates in addition to other pertinent events   9/26 presented to [name] [name], ED for complaints of shortness of breath, weight gain, and nausea  9/27 required initiation of vasopressor support for persistent hypotension in the setting of evolving cardiogenic shock. PCCM and HF consulted   Interim History / Subjective:  Sitting up in bed in mild discomfort, understands plan and agrees to proceed to cath lab for [facility] and possible Impella placement per HF   Objective    Blood pressure 95/65, pulse 79, temperature 97.6 F (36.4 C), temperature source Oral, resp. rate (!) 25, height 6' 4 (1.93 m), weight (!) 154.2 kg, SpO2 95%.    FiO2 (%):  [99 %] 99 %   Intake/Output Summary (Last 24 hours) at 06/14/2024 1127 Last data filed at 06/14/2024 1033 Gross per 24 hour  Intake 525.23 ml  Output --  Net 525.23 ml   Filed Weights    06/13/24 1405  Weight: (!) 154.2 kg    Examination: General: Acute on chronic ill appearing middle aged male sitting up in ED stretcher, in NAD HEENT: [place]/AT, MM pink/moist, PERRL,  Neuro: Alert and oriented x3, non-focal  CV: s1s2 regular rate and rhythm, no murmur, rubs, or gallops,  PULM:  Clear to auscultation, no increased work of breathing,  GI: soft, bowel sounds active in all 4 quadrants, non-tender, non-distended Extremities: warm/dry, generalized non-pitting edema  Skin: no rashes or lesions   Resolved problem list   Assessment and Plan  Cardiogenic shock with history of HFrEF - Most recent echocardiogram 04/18/2024 with mobile swirling contrast deficit within the LV likely related to low flow EF estimated 20 to 25% , global LV hypokinesis normal RV function P: Heart failure following, appreciate assistance  Cardiac catheterization per heart failure, may need Impella support Obtain ECHO  Continue pressors for MAP goal > 60 Obtain central access and check COOX and CVP Optimize electrolytes  Close monitoring of volume status/diureses as needed  Continuous telemetry  Resumption of GDMT as able  History of recurrent ventricular tachycardia s/p ablation now with biventricular ICD P: Continuous telemetry Support cardiogenic shock as above Optimize electrolytes, K greater than 4, mag greater than 2  Paroxysmal atrial fibrillation on chronic anticoagulation P: Initiation of anticoagulation per heart failure Continuous telemetry  Hyperlipidemia P: Continue statin  AKI superimposed on CKD stage IIIb -Creatinine 2.23 with GFR 33 on 06/04/2024 compared to creatinine 4.47 with GFR 14 on admission 9/26 Hyperkalemia P: Follow renal function  Monitor urine output Trend Bmet Avoid nephrotoxins Ensure adequate renal perfusion  Management of cardiogenic shock as above  OSA on BiPAP P: Continue nocturnal BiPAP   Labs   CBC: Recent Labs  Lab 06/13/24 1435  06/14/24 0503  WBC 10.8* 9.7  HGB 8.8* 8.2*  HCT 28.9* 25.8*  MCV 101.8* 101.6*  PLT 355 306    Basic Metabolic Panel: Recent Labs  Lab 06/13/24 1435 06/14/24 0503  NA 130* 132*  K 5.6* 5.5*  CL 94* 100  CO2 22 19*  GLUCOSE 139* 135*  BUN 73* 80*  CREATININE 4.47* 3.68*  CALCIUM  9.2 8.1*   GFR: Estimated Creatinine Clearance: 33.9 mL/min (A) (by C-G formula based on SCr of 3.68 mg/dL (H)). Recent Labs  Lab 06/13/24 1435 06/13/24 1649 06/14/24 0503 06/14/24 1040  WBC 10.8*  --  9.7  --   LATICACIDVEN  --  1.1  --  0.9    Liver Function Tests: Recent Labs  Lab 06/13/24 1435  AST 15  ALT 27  ALKPHOS 112  BILITOT 0.3  PROT 6.8  ALBUMIN  3.8   No results for input(s): LIPASE, AMYLASE in the last 168 hours. No results for input(s): AMMONIA in the last 168 hours.  ABG    Component Value Date/Time   PHART 7.450 12/25/2016 1030   PCO2ART 40.8 12/25/2016 1030   PO2ART 70.0 (L) 12/25/2016 1030   HCO3 27.3 01/31/2024 0814   HCO3 28.9 (H) 01/31/2024 0814   TCO2 29 01/31/2024 0814   TCO2 30 01/31/2024 0814   ACIDBASEDEF 1.0 12/25/2016 0616   O2SAT 60 01/31/2024 0814   O2SAT 61 01/31/2024 0814     Coagulation Profile: No results for input(s): INR, PROTIME in the last 168 hours.  Cardiac Enzymes: No results for input(s): CKTOTAL, CKMB, CKMBINDEX, TROPONINI in the last 168 hours.  HbA1C: Hgb A1c MFr Bld  Date/Time Value Ref Range Status  02/02/2021 11:04 AM 5.8 (H) 4.8 - 5.6 % Final    Comment:             Prediabetes: 5.7 - 6.4          Diabetes: >6.4          Glycemic control for adults with diabetes: <7.0   12/25/2016 02:14 PM 5.2 4.8 - 5.6 % Final    Comment:    (NOTE)         Pre-diabetes: 5.7 - 6.4         Diabetes: >6.4         Glycemic control for adults with diabetes: <7.0     CBG: No results for input(s): GLUCAP in the last 168 hours.  Review of Systems:   Please see the history of present illness. All other  systems reviewed and are negative   Past Medical History:  He,  has a past medical history of AICD (automatic cardioverter/defibrillator) present, Arthritis, Atrial fibrillation (HCC), Chronic HFrEF (heart failure with reduced ejection fraction) (HCC), Chronic systolic heart failure (HCC), COPD (chronic obstructive pulmonary disease) (HCC), Dyspnea, H/O ETOH abuse, HTN (hypertension), Hypersomnia, Mild CAD, Nonischemic cardiomyopathy (HCC), Obesity, morbid (HCC), OSA on CPAP, Tobacco use disorder, and Ventricular tachycardia (HCC).   Surgical History:   Past Surgical History:  Procedure Laterality Date   CARDIAC CATHETERIZATION N/A 04/08/2015   Procedure: Right/Left Heart Cath and Coronary Angiography;  Surgeon: Candyce GORMAN Reek, MD;  Location: [facility] INVASIVE CV LAB;  Service: Cardiovascular;  Laterality: N/A;   EP IMPLANTABLE DEVICE N/A 04/14/2015   Procedure: BiV ICD Insertion CRT-D;  Surgeon: Elspeth JAYSON Sage, MD;  Location: [facility]  INVASIVE CV LAB;  Service: Cardiovascular;  Laterality: N/A;   HYDROCELE EXCISION / REPAIR     ICD GENERATOR CHANGEOUT N/A 02/20/2022   Procedure: ICD GENERATOR CHANGEOUT;  Surgeon: Fernande Elspeth BROCKS, MD;  Location: [facility] INVASIVE CV LAB;  Service: Cardiovascular;  Laterality: N/A;   ICD IMPLANT     RIGHT HEART CATH N/A 01/31/2024   Procedure: RIGHT HEART CATH;  Surgeon: Rolan Ezra RAMAN, MD;  Location: [facility] INVASIVE CV LAB;  Service: Cardiovascular;  Laterality: N/A;   RIGHT/LEFT HEART CATH AND CORONARY ANGIOGRAPHY N/A 06/07/2023   Procedure: RIGHT/LEFT HEART CATH AND CORONARY ANGIOGRAPHY;  Surgeon: Rolan Ezra RAMAN, MD;  Location: [facility] INVASIVE CV LAB;  Service: Cardiovascular;  Laterality: N/A;   TOTAL HIP ARTHROPLASTY Right 11/04/2018   Procedure: TOTAL HIP ARTHROPLASTY ANTERIOR APPROACH;  Surgeon: Yvone Rush, MD;  Location: [facility] ORS;  Service: Orthopedics;  Laterality: Right;   TOTAL KNEE ARTHROPLASTY Right 01/30/2020   Procedure: TOTAL KNEE ARTHROPLASTY;  Surgeon: Yvone Rush,  MD;  Location: [facility] ORS;  Service: Orthopedics;  Laterality: Right;   TOTAL KNEE ARTHROPLASTY Left 07/30/2020   Procedure: TOTAL KNEE ARTHROPLASTY;  Surgeon: Yvone Rush, MD;  Location: [facility] ORS;  Service: Orthopedics;  Laterality: Left;   TUMOR EXCISION Left    TUMOR EXCISION Left 1982   Benign tumor removed from L leg   VASECTOMY       Social History:   reports that he quit smoking about 18 years ago. His smoking use included cigarettes. He started smoking about 22 years ago. He has a 2 pack-year smoking history. He has never used smokeless tobacco. He reports current alcohol use. He reports current drug use. Drug: Marijuana.   Family History:  His family history includes Congestive Heart Failure in his father; Coronary artery disease in an other family member; Healthy in his brother, daughter, father, sister, sister, son, and son; Heart Problems in his mother; Other in his mother.   Allergies Allergies  Allergen Reactions   Sglt2 Inhibitors Other (See Comments)    Recurrent UTI     Home Medications  Prior to Admission medications   Medication Sig Start Date End Date Taking? Authorizing Provider  albuterol  (VENTOLIN  HFA) 108 (90 Base) MCG/ACT inhaler Inhale 1 puff into the lungs every 6 (six) hours as needed for wheezing or shortness of breath. 06/12/23   Rolan Ezra RAMAN, MD  amiodarone  (PACERONE ) 200 MG tablet Take 1 tablet (200 mg total) by mouth 2 (two) times daily. 12/14/23   Rolan Ezra RAMAN, MD  Ascorbic Acid (VITAMIN C PO) Take 1 tablet by mouth daily.    [provider]  atorvastatin  (LIPITOR ) 80 MG tablet TAKE 1 TABLET EVERY EVENING 03/19/24   [name], Dalton S, MD  metoprolol  succinate (TOPROL -XL) 50 MG 24 hr tablet Take 1 tablet (50 mg total) by mouth daily. 01/22/24   Rolan Ezra RAMAN, MD  Multiple Vitamins-Minerals (MULTIVITAMIN WITH MINERALS) tablet Take 1 tablet by mouth daily.    [provider]  oxyCODONE  (ROXICODONE ) 5 MG immediate release tablet Take 1  tablet (5 mg total) by mouth every 4 (four) hours as needed for up to 10 doses. 06/04/24   Hoy Nidia FALCON, PA-C  potassium chloride  SA (KLOR-CON  M) 20 MEQ tablet TAKE 1 TABLET EVERY DAY 06/04/24   Lesia Ozell Barter, PA-C  sacubitril -valsartan  (ENTRESTO ) 49-51 MG Take 1 tablet by mouth 2 (two) times daily. 01/03/24   [name], Toribio SAUNDERS, MD  sotalol  (BETAPACE ) 80 MG tablet Take 1 tablet (80 mg total)  by mouth 2 (two) times daily. 02/21/24   Lesia Ozell Barter, PA-C  spironolactone  (ALDACTONE ) 25 MG tablet TAKE 1 TABLET EVERY DAY 06/10/24   [name], Dalton S, MD  torsemide  (DEMADEX ) 20 MG tablet TAKE 3 TABLETS EVERY MORNING AND TAKE 2 TABLETS EVERY EVENING 06/10/24   Rolan Ezra RAMAN, MD  XARELTO  20 MG TABS tablet TAKE 1 TABLET DAILY WITH SUPPER. 03/17/24   Rolan Ezra RAMAN, MD     Critical care time:   CRITICAL CARE Performed by: Kayliegh Boyers D. Harris   Total critical care time: 45 minutes  Critical care time was exclusive of separately billable procedures and treating other patients.  Critical care was necessary to treat or prevent imminent or life-threatening deterioration.  Critical care was time spent personally by me on the following activities: development of treatment plan with patient and/or surrogate as well as nursing, discussions with consultants, evaluation of patient's response to treatment, examination of patient, obtaining history from patient or surrogate, ordering and performing treatments and interventions, ordering and review of laboratory studies, ordering and review of radiographic studies, pulse oximetry and re-evaluation of patient's condition.  Wynton Hufstetler D. Harris, NP-C [facility] Personal contact information can be found on Amion  If no contact or response made please call [phone] 06/14/2024, 12:35 PM

## 2024-06-14 NOTE — Interval H&P Note (Signed)
 History and Physical Interval Note:  06/14/2024 12:05 PM  [name] [name]  has presented today for surgery, with the diagnosis of CARDIOGENIC SHOCK.  The various methods of treatment have been discussed with the patient and family. After consideration of risks, benefits and other options for treatment, the patient has consented to  Procedure(s): VENTRICULAR ASSIST DEVICE INSERTION (N/A) as a surgical intervention.  The patient's history has been reviewed, patient examined, no change in status, stable for surgery.  I have reviewed the patient's chart and labs.  Questions were answered to the patient's satisfaction.     [name]

## 2024-06-14 NOTE — H&P (View-Only) (Signed)
 [facility]                                                                              [name] [name], is a 63 y.o. male, DOB - [birth year], FMW:[id number] Admit date - 06/13/2024    Outpatient Primary MD for the patient is Lorren, Greig PARAS, NP  LOS - 1  days  Chief Complaint  Patient presents with   Shortness of Breath   Hypotension       Brief summary   Patient is a 63 year old male with chronic systolic CHF, EF 79%, severely dilated LV, nonischemic cardiomyopathy, recurrent VT, CKD stage III, OSA on CPAP, HTN, COPD, obesity presented to ED with several days of progressive shortness of breath, nausea, weight gain.  Patient had presented to ED on 9/17, was evaluated for chest pain and shortness of breath.  Just prior to that he had a ICD shock.  Workup was benign in ED and was discharged home to follow-up with EP cardiology on 9/19.  Patient was felt to be stable and recommended to continue medical therapy.  Patient reported that since the clinic visit for the last several days he has been having progressively shortness of breath, nausea, anorexia, bilateral lower extremity edema, weight gain, abdominal bloating. In ED, reported weight up 10 pounds from when it was last checked. In ED, initial blood pressure 68/48 and was given fluid bolus Patient was admitted with plan to transfer to [facility] for heart failure cardiology to evaluate.   Assessment & Plan    Principal Problem:   Acute on chronic systolic and diastolic (congestive) heart failure (HCC) - Last known EF 20% per echo on 04/18/2024 with severely dilated LV, suspected nonischemic cardiomyopathy, presenting with AKI, creatinine 4.47 hypotensive, hyperkalemia, BNP 2884 - Received IV fluid bolus in ED for hypotension, home blocker, Entresto , torsemide , Aldactone  were held - Seen this morning, feeling somewhat poorly, BP soft, requested urgent transfer to [facility] for heart failure team evaluation.  Discussed with  Dr. Randine Turner/cardiology at [facility].  May need pressors or inotropes.  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  Creatinine 4.47 on admission.  Baseline 2.2-2.3 - Creatinine improving to 3.68 today Will defer to CHF team    Hyperkalemia -K 5.5, ordered Lokelma  10 g x 1   Atrial fibrillation, on anticoagulation - HR controlled - Outpatient on amiodarone  200 mg twice daily, Toprol -XL 50 mg daily, Xarelto  - Cardiology consulted, will defer to cardiology to resume amiodarone  and anticoagulation.  COPD/OSA -Will need CPAP at bedtime  - Currently no wheezing  Obesity class III Estimated body mass index is 41.39 kg/m as calculated from the following:   Height as of this encounter: 6' 4 (1.93 m).   Weight as of this encounter: 154.2 kg.  Code Status: Full CODE STATUS DVT Prophylaxis:  heparin  injection 5,000 Units Start: 06/13/24 2200   Level of Care: Level of care: Telemetry Cardiac Family Communication: Updated patient's wife at the bedside Disposition Plan:      Remains inpatient appropriate: Transferred to [facility] urgently.  Requested ED to  ED transfer and appreciate Dr. Randol and Dr. Vonna prompt assistance.   Procedures:    Consultants:   Cardiology  Antimicrobials:   Anti-infectives (From admission, onward)    None          Medications  atorvastatin   80 mg Oral QPM   heparin   5,000 Units Subcutaneous Q8H   sodium zirconium cyclosilicate   10 g Oral Once      Subjective:   [name] [name] was seen and examined today.  Seen this morning in the ER.  Sitting upright, feeling poorly, has abdominal distention, pitting edema, shortness of breath.  BP soft.   Wife at the bedside.  No acute chest pain.    Objective:   Vitals:   06/14/24 0615 06/14/24 0630 06/14/24 0645 06/14/24 0700  BP: 97/64 (!) 101/34 102/67 98/68  Pulse:   78 79  Resp: (!) 21 (!) 25 20 (!) 25  Temp:       TempSrc:      SpO2:   93% 93%  Weight:      Height:       No intake or output data in the 24 hours ending 06/14/24 0801   Wt Readings from Last 3 Encounters:  06/13/24 (!) 154.2 kg  06/13/24 (!) 154.2 kg  06/06/24 (!) 150.6 kg     Exam General: Alert and oriented x 3, sitting upright  Cardiovascular: S1 S2 auscultated,  RRR Respiratory: Diminished breath sounds at the bases Gastrointestinal: Soft, nontender, distended, + bowel sounds Ext: 2+ pedal edema bilaterally Neuro: No new deficits Psych: Normal affect     Data Reviewed:  I have personally reviewed following labs    CBC Lab Results  Component Value Date   WBC 9.7 06/14/2024   RBC 2.54 (L) 06/14/2024   HGB 8.2 (L) 06/14/2024   HCT 25.8 (L) 06/14/2024   MCV 101.6 (H) 06/14/2024   MCH 32.3 06/14/2024   PLT 306 06/14/2024   MCHC 31.8 06/14/2024   RDW 14.8 06/14/2024   LYMPHSABS 0.9 06/07/2023   MONOABS 0.3 06/07/2023   EOSABS 0.2 06/07/2023   BASOSABS 0.0 06/07/2023     Last metabolic panel Lab Results  Component Value Date   NA 132 (L) 06/14/2024   K 5.5 (H) 06/14/2024   CL 100 06/14/2024   CO2 19 (L) 06/14/2024   BUN 80 (H) 06/14/2024   CREATININE 3.68 (H) 06/14/2024   GLUCOSE 135 (H) 06/14/2024   GFRNONAA 18 (L) 06/14/2024   GFRAA >60 05/10/2020   CALCIUM  8.1 (L) 06/14/2024   PROT 6.8 06/13/2024   ALBUMIN  3.8 06/13/2024   LABGLOB 2.2 06/04/2023   AGRATIO 1.6 12/30/2021   BILITOT 0.3 06/13/2024   ALKPHOS 112 06/13/2024   AST 15 06/13/2024   ALT 27 06/13/2024   ANIONGAP 13 06/14/2024    CBG (last 3)  No results for input(s): GLUCAP in the last 72 hours.    Coagulation Profile: No results for input(s): INR, PROTIME in the last 168 hours.   Radiology Studies: I have personally reviewed the imaging studies  DG Chest Portable 1 View Result Date: 06/13/2024 EXAM: 1 VIEW(S) XRAY OF THE CHEST 06/13/2024 03:32:00 PM COMPARISON: 06/04/2024 CLINICAL HISTORY: SOB and hypotension.  FINDINGS: LINES, TUBES AND DEVICES: AICD noted, ventricular lead tip is obscured by underpenetration. LUNGS AND PLEURA: Low lung volumes are present, causing crowding of the pulmonary vasculature. No focal pulmonary opacity. No pulmonary edema. No pleural effusion. No pneumothorax. No consolidation. HEART AND MEDIASTINUM: Moderate cardiomegaly. Cardiomegaly and mediastinal contours  stable. BONES AND SOFT TISSUES: No acute osseous abnormality. LIMITATIONS/ARTIFACTS: The frontal projection is rotated to the right, reducing diagnostic sensitivity and specificity. IMPRESSION: 1. No acute cardiopulmonary abnormality. 2. Moderate cardiomegaly, stable. 3. AICD in place; ventricular lead tip not well visualized due to underpenetration. 4. Low lung volumes. Electronically signed by: Ryan Salvage MD 06/13/2024 04:01 PM EDT RP Workstation: [id number]       Nydia Distance M.D. [facility] 06/14/2024, 8:01 AM  Available via Epic secure chat 7am-7pm After 7 pm, please refer to night coverage provider listed on amion.

## 2024-06-14 NOTE — Progress Notes (Signed)
 [facility]                                                                              [name] [name], is a 63 y.o. male, DOB - [birth year], FMW:[id number] Admit date - 06/13/2024    Outpatient Primary MD for the patient is Lorren, Greig PARAS, NP  LOS - 1  days  Chief Complaint  Patient presents with   Shortness of Breath   Hypotension       Brief summary   Patient is a 63 year old male with chronic systolic CHF, EF 79%, severely dilated LV, nonischemic cardiomyopathy, recurrent VT, CKD stage III, OSA on CPAP, HTN, COPD, obesity presented to ED with several days of progressive shortness of breath, nausea, weight gain.  Patient had presented to ED on 9/17, was evaluated for chest pain and shortness of breath.  Just prior to that he had a ICD shock.  Workup was benign in ED and was discharged home to follow-up with EP cardiology on 9/19.  Patient was felt to be stable and recommended to continue medical therapy.  Patient reported that since the clinic visit for the last several days he has been having progressively shortness of breath, nausea, anorexia, bilateral lower extremity edema, weight gain, abdominal bloating. In ED, reported weight up 10 pounds from when it was last checked. In ED, initial blood pressure 68/48 and was given fluid bolus Patient was admitted with plan to transfer to [facility] for heart failure cardiology to evaluate.   Assessment & Plan    Principal Problem:   Acute on chronic systolic and diastolic (congestive) heart failure (HCC) - Last known EF 20% per echo on 04/18/2024 with severely dilated LV, suspected nonischemic cardiomyopathy, presenting with AKI, creatinine 4.47 hypotensive, hyperkalemia, BNP 2884 - Received IV fluid bolus in ED for hypotension, home blocker, Entresto , torsemide , Aldactone  were held - Seen this morning, feeling somewhat poorly, BP soft, requested urgent transfer to [name] for heart failure team evaluation.  Discussed with  Dr. Randine Turner/cardiology at [facility].  May need pressors or inotropes.  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  Creatinine 4.47 on admission.  Baseline 2.2-2.3 - Creatinine improving to 3.68 today Will defer to CHF team    Hyperkalemia -K 5.5, ordered Lokelma  10 g x 1   Atrial fibrillation, on anticoagulation - HR controlled - Outpatient on amiodarone  200 mg twice daily, Toprol -XL 50 mg daily, Xarelto  - Cardiology consulted, will defer to cardiology to resume amiodarone  and anticoagulation.  COPD/OSA -Will need CPAP at bedtime  - Currently no wheezing  Obesity class III Estimated body mass index is 41.39 kg/m as calculated from the following:   Height as of this encounter: 6' 4 (1.93 m).   Weight as of this encounter: 154.2 kg.  Code Status: Full CODE STATUS DVT Prophylaxis:  heparin  injection 5,000 Units Start: 06/13/24 2200   Level of Care: Level of care: Telemetry Cardiac Family Communication: Updated patient's wife at the bedside Disposition Plan:      Remains inpatient appropriate: Transferred to [facility] urgently.  Requested ED to  ED transfer and appreciate Dr. Randol and Dr. Vonna prompt assistance.   Procedures:    Consultants:   Cardiology  Antimicrobials:   Anti-infectives (From admission, onward)    None          Medications  atorvastatin   80 mg Oral QPM   heparin   5,000 Units Subcutaneous Q8H   sodium zirconium cyclosilicate   10 g Oral Once      Subjective:   [name] [name] was seen and examined today.  Seen this morning in the ER.  Sitting upright, feeling poorly, has abdominal distention, pitting edema, shortness of breath.  BP soft.   Wife at the bedside.  No acute chest pain.    Objective:   Vitals:   06/14/24 0615 06/14/24 0630 06/14/24 0645 06/14/24 0700  BP: 97/64 (!) 101/34 102/67 98/68  Pulse:   78 79  Resp: (!) 21 (!) 25 20 (!) 25  Temp:       TempSrc:      SpO2:   93% 93%  Weight:      Height:       No intake or output data in the 24 hours ending 06/14/24 0801   Wt Readings from Last 3 Encounters:  06/13/24 (!) 154.2 kg  06/13/24 (!) 154.2 kg  06/06/24 (!) 150.6 kg     Exam General: Alert and oriented x 3, sitting upright  Cardiovascular: S1 S2 auscultated,  RRR Respiratory: Diminished breath sounds at the bases Gastrointestinal: Soft, nontender, distended, + bowel sounds Ext: 2+ pedal edema bilaterally Neuro: No new deficits Psych: Normal affect     Data Reviewed:  I have personally reviewed following labs    CBC Lab Results  Component Value Date   WBC 9.7 06/14/2024   RBC 2.54 (L) 06/14/2024   HGB 8.2 (L) 06/14/2024   HCT 25.8 (L) 06/14/2024   MCV 101.6 (H) 06/14/2024   MCH 32.3 06/14/2024   PLT 306 06/14/2024   MCHC 31.8 06/14/2024   RDW 14.8 06/14/2024   LYMPHSABS 0.9 06/07/2023   MONOABS 0.3 06/07/2023   EOSABS 0.2 06/07/2023   BASOSABS 0.0 06/07/2023     Last metabolic panel Lab Results  Component Value Date   NA 132 (L) 06/14/2024   K 5.5 (H) 06/14/2024   CL 100 06/14/2024   CO2 19 (L) 06/14/2024   BUN 80 (H) 06/14/2024   CREATININE 3.68 (H) 06/14/2024   GLUCOSE 135 (H) 06/14/2024   GFRNONAA 18 (L) 06/14/2024   GFRAA >60 05/10/2020   CALCIUM  8.1 (L) 06/14/2024   PROT 6.8 06/13/2024   ALBUMIN  3.8 06/13/2024   LABGLOB 2.2 06/04/2023   AGRATIO 1.6 12/30/2021   BILITOT 0.3 06/13/2024   ALKPHOS 112 06/13/2024   AST 15 06/13/2024   ALT 27 06/13/2024   ANIONGAP 13 06/14/2024    CBG (last 3)  No results for input(s): GLUCAP in the last 72 hours.    Coagulation Profile: No results for input(s): INR, PROTIME in the last 168 hours.   Radiology Studies: I have personally reviewed the imaging studies  DG Chest Portable 1 View Result Date: 06/13/2024 EXAM: 1 VIEW(S) XRAY OF THE CHEST 06/13/2024 03:32:00 PM COMPARISON: 06/04/2024 CLINICAL HISTORY: SOB and hypotension.  FINDINGS: LINES, TUBES AND DEVICES: AICD noted, ventricular lead tip is obscured by underpenetration. LUNGS AND PLEURA: Low lung volumes are present, causing crowding of the pulmonary vasculature. No focal pulmonary opacity. No pulmonary edema. No pleural effusion. No pneumothorax. No consolidation. HEART AND MEDIASTINUM: Moderate cardiomegaly. Cardiomegaly and mediastinal contours  stable. BONES AND SOFT TISSUES: No acute osseous abnormality. LIMITATIONS/ARTIFACTS: The frontal projection is rotated to the right, reducing diagnostic sensitivity and specificity. IMPRESSION: 1. No acute cardiopulmonary abnormality. 2. Moderate cardiomegaly, stable. 3. AICD in place; ventricular lead tip not well visualized due to underpenetration. 4. Low lung volumes. Electronically signed by: Ryan Salvage MD 06/13/2024 04:01 PM EDT RP Workstation: [id number]       [name] [name] M.D. [facility] 06/14/2024, 8:01 AM  Available via Epic secure chat 7am-7pm After 7 pm, please refer to night coverage provider listed on amion.

## 2024-06-14 NOTE — Progress Notes (Signed)
 Chaplain responds to code STEMI and introduces self and services to pt and wife. He shares he's been through this before and requests prayer, which chaplain provides. Chaplain offers reassurance and pastoral support. Chaplain makes pt and wife aware of spiritual support is available.

## 2024-06-14 NOTE — Consult Note (Addendum)
 Cardiology Consultation   Patient ID: [name] [name] MRN: [id number]; DOB: October 12, 1960  Admit date: 06/13/2024 Date of Consult: 06/14/2024  PCP:  Lorren Greig PARAS, NP   [facility] Providers Cardiologist:  Ezra Shuck, MD  Electrophysiologist:  Eulas FORBES Furbish, MD     Patient Profile: [name] [name] is a 63 y.o. male with a hx of chronic HFrEF, NICM (?r/t ETOH abuse vs HTN), recurrent/sustained VT s/p episodic ICD shocks over the span of several years' time, Medtronic BiV-ICD, PAF with previous intermittent compliance with Xarelto , CKD 3b by labs, OSA on BiPAP, HTN, THC use, depression, anxiety who is being seen 06/14/2024 for the evaluation of CHF at the request of Dr. Davia.  History of Present Illness: [name] [name] follows with the Advanced HF team and EP with the above history. He has longstanding history of CHF/NICM going back even prior to the utilization of Epic EMR. He has had prior history of PAF as well as VT with prior ICD shocks requiring sotalol  and amiodarone . 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 There is a history of intermittent noncompliance with medications due to cost. Prior PYP scan not suggestive of TTR cardiac amyloidosis. He previously stopped Jardiance  due to multiple UTIs. He had a VT ablation at [facility] in 12/2023.  Last echo 04/2024 showed EF 20-25% with mobile swirling contrast defects in the LV likely related to low flow rather than thrombus, mild LVH, severe LAE, moderate RAE, mild MR, mild dilation of ascending aorta. He had recurrent VT/ICD shocks earlier this month prompting increase in amiodarone  dose at 06/06/24 OV (600mg  x 2 weeks then back to 400mg  daily) with appointment scheduled at [facility] to discuss alternative approaches. The patient reports he had nausea at the higher doses so only took for 3 days before he returned to taking amiodarone  200mg  BID. He denies any recurrent ICD shocks.  He presented to the [facility] yesterday with complaints of SOB,  generalized headache, weight gain, abdominal distension, LE edema, and generally feeling unwell. He hasn't weighed himself but feels he's up about 10 lbs. He has had poor oral intake in setting of poor appetite and abdominal bloating. He hasn't sleep well in a week due to feeling bad. No chest pain. He otherwise reports adherence with his medications, states he is taking torsemide  2 tablets BID. In ED he was severely hypotensive at 68/48. Labs notable for worsening AKI Cr 4.47 (up from recent 2.3 baseline) -> to 3.68 today, hyperkalemia of 5.6, hyponatremia of 130, pBNP 2884, hsTroponin 37->36, worsening anemia with Hgb 8.8 (macrocytic, previous 10-11 range) -> 8.2, lactic acid 1.1, leukocytosis of 10.8, Covid/flu/RSV neg. UA with small leukocytes and rare bacteria. CXR showed stable moderate cardiomegaly, low lung volumes, no acute CP abnormality. Blood cx in process. He has received 2 doses of Lokelma , 500cc iVF x 2, and holding of majority of home medications. SBP originally improved to  90s/low 100s which is closer to baseline but subsequently declining again into the 70s-80s. No focal neurologic symptoms, fevers, or chills.    Past Medical History:  Diagnosis Date   AICD (automatic cardioverter/defibrillator) present    Arthritis    Atrial fibrillation (HCC)    Chronic HFrEF (heart failure with reduced ejection fraction) (HCC)    Chronic systolic heart failure (HCC)    Nonischemic CM: echo 4/12 with EF 25-30%, grade 2 diast dysfxn, mild dilated aortic root 43 mm, trivial MR, mod LAE   COPD (chronic obstructive pulmonary disease) (HCC)    Dyspnea  with exertion    H/O ETOH abuse    HTN (hypertension)    Hypersomnia    Mild CAD    Nonischemic cardiomyopathy (HCC)    Obesity, morbid (HCC)    OSA on CPAP    Tobacco use disorder    Ventricular tachycardia [facility])     Past Surgical History:  Procedure Laterality Date   CARDIAC CATHETERIZATION N/A 04/08/2015   Procedure: Right/Left Heart  Cath and Coronary Angiography;  Surgeon: Candyce GORMAN Reek, MD;  Location: [facility] INVASIVE CV LAB;  Service: Cardiovascular;  Laterality: N/A;   EP IMPLANTABLE DEVICE N/A 04/14/2015   Procedure: BiV ICD Insertion CRT-D;  Surgeon: Elspeth JAYSON Sage, MD;  Location: [facility] INVASIVE CV LAB;  Service: Cardiovascular;  Laterality: N/A;   HYDROCELE EXCISION / REPAIR     ICD GENERATOR CHANGEOUT N/A 02/20/2022   Procedure: ICD GENERATOR CHANGEOUT;  Surgeon: Sage Elspeth JAYSON, MD;  Location: [facility] INVASIVE CV LAB;  Service: Cardiovascular;  Laterality: N/A;   ICD IMPLANT     RIGHT HEART CATH N/A 01/31/2024   Procedure: RIGHT HEART CATH;  Surgeon: Rolan Ezra GORMAN, MD;  Location: [facility] INVASIVE CV LAB;  Service: Cardiovascular;  Laterality: N/A;   RIGHT/LEFT HEART CATH AND CORONARY ANGIOGRAPHY N/A 06/07/2023   Procedure: RIGHT/LEFT HEART CATH AND CORONARY ANGIOGRAPHY;  Surgeon: Rolan Ezra GORMAN, MD;  Location: [facility] INVASIVE CV LAB;  Service: Cardiovascular;  Laterality: N/A;   TOTAL HIP ARTHROPLASTY Right 11/04/2018   Procedure: TOTAL HIP ARTHROPLASTY ANTERIOR APPROACH;  Surgeon: Yvone Rush, MD;  Location: [facility] ORS;  Service: Orthopedics;  Laterality: Right;   TOTAL KNEE ARTHROPLASTY Right 01/30/2020   Procedure: TOTAL KNEE ARTHROPLASTY;  Surgeon: Yvone Rush, MD;  Location: [facility] ORS;  Service: Orthopedics;  Laterality: Right;   TOTAL KNEE ARTHROPLASTY Left 07/30/2020   Procedure: TOTAL KNEE ARTHROPLASTY;  Surgeon: Yvone Rush, MD;  Location: [facility] ORS;  Service: Orthopedics;  Laterality: Left;   TUMOR EXCISION Left    TUMOR EXCISION Left 1982   Benign tumor removed from L leg   VASECTOMY       Home Medications:  Prior to Admission medications   Medication Sig Start Date End Date Taking? Authorizing Provider  albuterol  (VENTOLIN  HFA) 108 (90 Base) MCG/ACT inhaler Inhale 1 puff into the lungs every 6 (six) hours as needed for wheezing or shortness of breath. 06/12/23   Rolan Ezra GORMAN, MD  amiodarone  (PACERONE ) 200 MG tablet Take  1 tablet (200 mg total) by mouth 2 (two) times daily. 12/14/23   Rolan Ezra GORMAN, MD  Ascorbic Acid (VITAMIN C PO) Take 1 tablet by mouth daily.    [provider]  atorvastatin  (LIPITOR ) 80 MG tablet TAKE 1 TABLET EVERY EVENING 03/19/24   Rolan Ezra GORMAN, MD  metoprolol  succinate (TOPROL -XL) 50 MG 24 hr tablet Take 1 tablet (50 mg total) by mouth daily. 01/22/24   Rolan Ezra GORMAN, MD  Multiple Vitamins-Minerals (MULTIVITAMIN WITH MINERALS) tablet Take 1 tablet by mouth daily.    [provider]  oxyCODONE  (ROXICODONE ) 5 MG immediate release tablet Take 1 tablet (5 mg total) by mouth every 4 (four) hours as needed for up to 10 doses. 06/04/24   Hoy Nidia FALCON, [name] [name]  potassium chloride  SA (KLOR-CON  M) 20 MEQ tablet TAKE 1 TABLET EVERY DAY 06/04/24   Lesia Ozell Barter, [name] [name]  sacubitril -valsartan  (ENTRESTO ) 49-51 MG Take 1 tablet by mouth 2 (two) times daily. 01/03/24   [name], Toribio SAUNDERS, MD  sotalol  (BETAPACE ) 80 MG tablet Take 1 tablet (  80 mg total) by mouth 2 (two) times daily. 02/21/24   Lesia Ozell Barter, [name] [name]  spironolactone  (ALDACTONE ) 25 MG tablet TAKE 1 TABLET EVERY DAY 06/10/24   Rolan Ezra RAMAN, MD  torsemide  (DEMADEX ) 20 MG tablet TAKE 3 TABLETS EVERY MORNING AND TAKE 2 TABLETS EVERY EVENING 06/10/24   Lucan Riner S, MD  XARELTO  20 MG TABS tablet TAKE 1 TABLET DAILY WITH SUPPER. 03/17/24   Deniesha Stenglein S, MD    Scheduled Meds:  atorvastatin   80 mg Oral QPM   heparin   5,000 Units Subcutaneous Q8H   Continuous Infusions:  sodium chloride      norepinephrine (LEVOPHED) Adult infusion 2 mcg/min (06/14/24 1033)   PRN Meds: acetaminophen  **OR** acetaminophen , albuterol , butalbital -acetaminophen -caffeine , ondansetron  **OR** ondansetron  (ZOFRAN ) IV, oxyCODONE   Allergies:    Allergies  Allergen Reactions   Sglt2 Inhibitors Other (See Comments)    Recurrent UTI    Social History:   Social History   Socioeconomic History   Marital status: Significant  Other    Spouse name: Not on file   Number of children: 2   Years of education: Not on file   Highest education level: Not on file  Occupational History   Occupation: scrap metal   Occupation: disabled, odd jobs  Tobacco Use   Smoking status: Former    Current packs/day: 0.00    Average packs/day: 0.5 packs/day for 4.0 years (2.0 ttl pk-yrs)    Types: Cigarettes    Start date: 10/29/2001    Quit date: 10/29/2005    Years since quitting: 18.6   Smokeless tobacco: Never   Tobacco comments:    about a pack a week. social  Vaping Use   Vaping status: Never Used  Substance and Sexual Activity   Alcohol use: Yes    Comment: 12 beers per week    Drug use: Yes    Types: Marijuana   Sexual activity: Yes    Partners: Female  Other Topics Concern   Not on file  Social History Narrative   ** Merged History Encounter **       Social Drivers of Health   Financial Resource Strain: Low Risk  (10/04/2023)   Overall Financial Resource Strain (CARDIA)    Difficulty of Paying Living Expenses: Not hard at all  Food Insecurity: No Food Insecurity (10/04/2023)   Hunger Vital Sign    Worried About Running Out of Food in the Last Year: Never true    Ran Out of Food in the Last Year: Never true  Transportation Needs: No Transportation Needs (10/04/2023)   PRAPARE - Administrator, Civil Service (Medical): No    Lack of Transportation (Non-Medical): No  Physical Activity: Insufficiently Active (10/04/2023)   Exercise Vital Sign    Days of Exercise per Week: 3 days    Minutes of Exercise per Session: 30 min  Stress: No Stress Concern Present (10/04/2023)   [employer] of Occupational Health - Occupational Stress Questionnaire    Feeling of Stress : Not at all  Social Connections: Socially Isolated (10/04/2023)   Social Connection and Isolation Panel    Frequency of Communication with Friends and Family: Three times a week    Frequency of Social Gatherings with Friends and  Family: Twice a week    Attends Religious Services: Never    Database administrator or Organizations: No    Attends Banker Meetings: Never    Marital Status: Divorced  Catering manager Violence: Not At Risk (10/04/2023)  Humiliation, Afraid, Rape, and Kick questionnaire    Fear of Current or Ex-Partner: No    Emotionally Abused: No    Physically Abused: No    Sexually Abused: No    Family History:   Family History  Problem Relation Age of Onset   Other Mother        cardiac surgery. late 1990s   Heart Problems Mother        CABG AGE 55   Congestive Heart Failure Father    Healthy Father        AGE 96   Coronary artery disease Other    Healthy Brother        AGE 56   Healthy Sister        AGE 61   Healthy Sister        AGE 76   Healthy Son    Healthy Son    Healthy Daughter      ROS:  Please see the history of present illness.  All other ROS reviewed and negative.     Physical Exam/Data: Vitals:   06/14/24 0950 06/14/24 0959 06/14/24 1000 06/14/24 1010  BP: 99/60  (!) 85/56 (!) 82/62  Pulse: 81  79 81  Resp: (!) 23  20 17   Temp:  97.6 F (36.4 C)    TempSrc:  Oral    SpO2: 94%  96% 96%  Weight:      Height:        Intake/Output Summary (Last 24 hours) at 06/14/2024 1034 Last data filed at 06/14/2024 1033 Gross per 24 hour  Intake 525.23 ml  Output --  Net 525.23 ml      06/13/2024    2:05 PM 06/13/2024    1:13 PM 06/06/2024   10:11 AM  Last 3 Weights  Weight (lbs) 340 lb 340 lb 332 lb  Weight (kg) 154.223 kg 154.223 kg 150.594 kg     Body mass index is 41.39 kg/m.  General: Ill appearing obese WM with mild increase WOB Head: Normocephalic, atraumatic, sclera non-icteric, no xanthomas, nares are without discharge. Neck: Negative for carotid bruits. Elevated JVP Lungs: Diffusely diminished without wheezes, rales, or rhonchi. Breathing is unlabored. Heart: RRR S1 S2 without murmurs, rubs, or gallops.  Abdomen: Distended with normoactive  bowel sounds. No rebound/guarding. Extremities: No clubbing or cyanosis. Mild BLE edema. Neuro: Alert and oriented X 3. Moves all extremities spontaneously. Psych:  Responds to questions appropriately with a normal affect.   EKG:  The EKG was personally reviewed and demonstrates:  AV paced rhythm 80bpm, baseline STTW changes similar to prior Telemetry:  Telemetry was personally reviewed and demonstrates:  AV paced rhythm, no VT/breakthrough AF  Relevant CV Studies:  2d echo 04/18/24  1. Mobile swiriling contrast defects in the LV, likely related to low  flow rather than thrombus. Left ventricular ejection fraction, by  estimation, is 20 to 25%. Left ventricular ejection fraction by PLAX is 21  %. The left ventricle has severely  decreased function. The left ventricle demonstrates global hypokinesis.  The left ventricular internal cavity size was severely dilated. There is  mild left ventricular hypertrophy. Left ventricular diastolic function  could not be evaluated.   2. Right ventricular systolic function is normal. The right ventricular  size is normal. Tricuspid regurgitation signal is inadequate for assessing  PA pressure.   3. Left atrial size was severely dilated.   4. Right atrial size was moderately dilated.   5. The mitral valve is grossly normal. Mild  mitral valve regurgitation.   6. The aortic valve was not well visualized. Aortic valve regurgitation  is not visualized.   7. Aortic dilatation noted. There is mild dilatation of the ascending  aorta, measuring 41 mm.   8. The inferior vena cava is normal in size with greater than 50%  respiratory variability, suggesting right atrial pressure of 3 mmHg.   Comparison(s): Changes from prior study are noted. 06/08/2023: LVEF 20-25%.   Laboratory Data: High Sensitivity Troponin:   Recent Labs  Lab 06/04/24 0204 06/04/24 0452  TROPONINIHS 11 10     Chemistry Recent Labs  Lab 06/13/24 1435 06/14/24 0503  NA 130* 132*   K 5.6* 5.5*  CL 94* 100  CO2 22 19*  GLUCOSE 139* 135*  BUN 73* 80*  CREATININE 4.47* 3.68*  CALCIUM  9.2 8.1*  GFRNONAA 14* 18*  ANIONGAP 14 13    Recent Labs  Lab 06/13/24 1435  PROT 6.8  ALBUMIN  3.8  AST 15  ALT 27  ALKPHOS 112  BILITOT 0.3   Lipids No results for input(s): CHOL, TRIG, HDL, LABVLDL, LDLCALC, CHOLHDL in the last 168 hours.  Hematology Recent Labs  Lab 06/13/24 1435 06/14/24 0503  WBC 10.8* 9.7  RBC 2.84* 2.54*  HGB 8.8* 8.2*  HCT 28.9* 25.8*  MCV 101.8* 101.6*  MCH 31.0 32.3  MCHC 30.4 31.8  RDW 14.6 14.8  PLT 355 306   Thyroid  No results for input(s): TSH, FREET4 in the last 168 hours.  BNP Recent Labs  Lab 06/13/24 1435  PROBNP 2,884.0*    DDimer No results for input(s): DDIMER in the last 168 hours.  Radiology/Studies:  DG Chest Portable 1 View Result Date: 06/13/2024 EXAM: 1 VIEW(S) XRAY OF THE CHEST 06/13/2024 03:32:00 PM COMPARISON: 06/04/2024 CLINICAL HISTORY: SOB and hypotension. FINDINGS: LINES, TUBES AND DEVICES: AICD noted, ventricular lead tip is obscured by underpenetration. LUNGS AND PLEURA: Low lung volumes are present, causing crowding of the pulmonary vasculature. No focal pulmonary opacity. No pulmonary edema. No pleural effusion. No pneumothorax. No consolidation. HEART AND MEDIASTINUM: Moderate cardiomegaly. Cardiomegaly and mediastinal contours stable. BONES AND SOFT TISSUES: No acute osseous abnormality. LIMITATIONS/ARTIFACTS: The frontal projection is rotated to the right, reducing diagnostic sensitivity and specificity. IMPRESSION: 1. No acute cardiopulmonary abnormality. 2. Moderate cardiomegaly, stable. 3. AICD in place; ventricular lead tip not well visualized due to underpenetration. 4. Low lung volumes. Electronically signed by: Ryan Salvage MD 06/13/2024 04:01 PM EDT RP Workstation: [id number]     Assessment and Plan:  1. AKI on CKD 3b with hyperkalemia, hyponatremia, hypotension and concern  for cardiogenic shock 2 Acute on chronic HFrEF/NICM 3. History of recurrent ventricular tachycardia s/p ablation with recent recurrent shocks 4. PAF on chronic anticoagulation 5. OSA on BiPAP 6. Hx ETOH/THC use 7. Acute on chronic macrocytic anemia  Patient transferred from [facility] to [facility] due to complex cardiac history and concern for hypotension, AKI, CHF. He initially had some improvement in BP with IVF but blood pressure is recurrently declining, last 78 systolic. Patient has been feeling poorly with SOB, weight gain, malaise, abdominal bloating, poor oral intake x 1 week. Discussed with Dr. Rolan who will also evaluate patient expeditiously -> starting levophed peripherally, with PCCM consulted for central line placement. Further recs to follow. Addendum: SBP 99 systolic on levophed. Repeat lactic acid pending. Dr. Rolan plans to activate cath lab. PCCM will see patient to admit to ICU, cardiology will remain on board as consultants. Notified general hospitalist team we do not feel  there is a specific need for them to follow at this time given plan for PCCM/AHF to manage care   Risk Assessment/Risk Scores:       New York  Heart Association (NYHA) Functional Class NYHA Class IV  CHA2DS2-VASc Score = 3   This indicates a 3.2% annual risk of stroke. The patient's score is based upon: CHF History: 1 HTN History: 1 Diabetes History: 0 Stroke History: 0 Vascular Disease History: 1 Age Score: 0 Gender Score: 0     For questions or updates, please contact [facility] Please consult [email or web address] for contact info under    Signed, [name] N Dunn, [name] [name]  06/14/2024 10:34 AM  Patient seen with PA, I formulated the plan and agree with the above note.   History as noted above.  Long-standing nonischemic cardiomyopathy and VT.  He had VT earlier this month with ICD shock and amiodarone  was increased but he is back down to 200 mg bid. Over the last week, he developed abdominal  bloating, nausea, orthopnea, and increased dyspnea.  He presented to the ER, creatinine up to 4.47 with SBP 70s-80s initially.  Usually SBP in 90s.  He was given 1 liter IVF and started on peripheral NE, now at 5.  Currently, SBP 110s.  He was volume overloaded on  exam and I was concerned for cardiogenic shock.  However, lactate not elevated (0.9).    We brought him to the cath lab.   RHC Procedural Findings: Hemodynamics (mmHg) RA mean 26 RV 48/28 PA 51/29, mean 38 PCWP mean 28 Oxygen saturations: PA 45% AO 88% Cardiac Output (Fick) 7.7  Cardiac Index (Fick) 2.77 PVR 1.3 WU Cardiac Output (Thermo) 8.58 Cardiac Index (Thermo) 3.09 PVR 1.2 WU PAPi 0.88   Swan left in place.  General: NAD Neck: JVP 16+, no thyromegaly or thyroid  nodule.  Lungs: Clear to auscultation bilaterally with normal respiratory effort. CV: Nondisplaced PMI.  Heart regular S1/S2, no S3/S4, no murmur.  1+ edema to knees.  No carotid bruit.  Normal pedal pulses.  Abdomen: Soft, nontender, no hepatosplenomegaly, no distention.  Skin: Intact without lesions or rashes.  Neurologic: Alert and oriented x 3.  Psych: Normal affect. Extremities: No clubbing or cyanosis.  HEENT: Normal.   1. Acute on chronic systolic CHF: NICM, Echo 12/2016 EF 15%. Minimal CAD on cath in July 2016 and again in 9/24. Medtronic CRT-D device. ETOH vs HTN vs myocarditis as cause of cardiomyopathy. Echo 12/22 and again in 9/24 showed that EF remains 20-25%.  PYP scan not suggestive of TTR cardiac amyloidosis.  RHC 5/25 showed R>L heart failure with preserved cardiac output by Fick and thermo but low PAPi.  He historically has very frequent NSVT and VT.  Last echo in 8/25 showed EF 20-25%, normal RV function per report, mild MR.  He was admitted with low BP (baseline SBP 90s) and marked volume overload.  Started on peripheral norepinephrine.  RHC done showed markedly elevated biventricular filling pressures but cardiac index normal by Fick and  thermodilution and lactate normal.  PAPi was low.  Initially I had expected he would need Impella but will not place with normal lactate and good cardiac output (2.77 Fick/3.09 thermo).  - SBP 110s, can wean down on norepinephrine.  - I am going to start milrinone 0.125 mcg/kg/min for RV support.  With milrinone use, will convert amiodarone  to IV.  - Lasix  120 mg IV bolus then 20 mg/hr.  Will give metolazone  5 mg po x 1.  Will  try aggressive diuresis.  If this fails, may require CVVH vs consider right-sided support.  - Hold Entresto , spironolactone , Toprol  XL.  - He stopped Jardiance  after 3 UTIs, would not start back.  2. Paroxysmal atrial fibrillation: Maintaining SR .  - Continue amiodarone .  - Heparin  gtt for now.  3. ETOH abuse: Suspect this contributes to cardiomyopathy.  He has cut back but still drinks moderately, asked him to cut back further.   4. Tobacco abuse: No longer smoking but does smoke marijuana on some days 5. VT: Had VT ablation at [facility] in 4/25 but still with frequent NSVT and VT.  Had VT with shock earlier this month.  - Amiodarone  gtt while on milrinone.  - Hold sotalol  with AKI.  - He did not tolerate quinidine.  - Hold Toprol  XL with marked volume overload.  - He has been referred to EP at [facility] for additional evaluation.  6. OSA: Use BiPAP.  7. AKI on CKD stage 3: Creatinine up to 4.47, baseline recently has been around 2.2-2.3.  Suspect cardiorenal with markedly elevated right-sided filling pressures.  - Attempt aggressive diuresis to lower renal venous pressure.   CRITICAL CARE Performed by: Ezra Shuck  Total critical care time: 75 minutes  Critical care time was exclusive of separately billable procedures and treating other patients.  Critical care was necessary to treat or prevent imminent or life-threatening deterioration.  Critical care was time spent personally by me on the following activities: development of treatment plan with patient and/or  surrogate as well as nursing, discussions with consultants, evaluation of patient's response to treatment, examination of patient, obtaining history from patient or surrogate, ordering and performing treatments and interventions, ordering and review of laboratory studies, ordering and review of radiographic studies, pulse oximetry and re-evaluation of patient's condition.  Ezra Shuck 06/14/2024 1:55 PM

## 2024-06-14 NOTE — ED Notes (Signed)
 Dr Davia in to evaluate pt this morning. Plan to go ED to ED if no beds are available.

## 2024-06-14 NOTE — Progress Notes (Signed)
 PHARMACY - ANTICOAGULATION CONSULT NOTE  Pharmacy Consult for heparin  Indication: atrial fibrillation  Allergies  Allergen Reactions   Sglt2 Inhibitors Other (See Comments)    Recurrent UTI    Patient Measurements: Height: 6' 4 (193 cm) Weight: (!) 154.2 kg (340 lb) IBW/kg (Calculated) : 86.8 HEPARIN  DW (KG): 122.2  Vital Signs: Temp: 97.6 F (36.4 C) (09/27 0959) Temp Source: Oral (09/27 0959) BP: 95/65 (09/27 1120) Pulse Rate: 79 (09/27 1120)  Labs: Recent Labs    06/13/24 1435 06/14/24 0503  HGB 8.8* 8.2*  HCT 28.9* 25.8*  PLT 355 306  CREATININE 4.47* 3.68*    Estimated Creatinine Clearance: 33.9 mL/min (A) (by C-G formula based on SCr of 3.68 mg/dL (H)).   Medical History: Past Medical History:  Diagnosis Date   AICD (automatic cardioverter/defibrillator) present    Arthritis    Atrial fibrillation (HCC)    Chronic HFrEF (heart failure with reduced ejection fraction) (HCC)    Chronic systolic heart failure (HCC)    Nonischemic CM: echo 4/12 with EF 25-30%, grade 2 diast dysfxn, mild dilated aortic root 43 mm, trivial MR, mod LAE   COPD (chronic obstructive pulmonary disease) (HCC)    Dyspnea    with exertion    H/O ETOH abuse    HTN (hypertension)    Hypersomnia    Mild CAD    Nonischemic cardiomyopathy (HCC)    Obesity, morbid (HCC)    OSA on CPAP    Tobacco use disorder    Ventricular tachycardia (HCC)       Assessment: 30 yoM admitted with cardiogenic shock. Pt on rivaroxaban  PTA for hx AF. Pt in renal failure and may potentially need MCS so will hold DOAC and use IV heparin . LD rivaroxaban  unknown.  Goal of Therapy:  Heparin  level 0.3-0.7 units/ml aPTT 66-102 seconds Monitor platelets by anticoagulation protocol: Yes   Plan:  Heparin  1600 units/h no bolus Check aPTT and heparin  level in 8h  Ozell Jamaica, PharmD, [place], [facility] Clinical Pharmacist [phone] Please check AMION for all [facility] numbers 06/14/2024

## 2024-06-15 ENCOUNTER — Inpatient Hospital Stay (HOSPITAL_COMMUNITY)

## 2024-06-15 ENCOUNTER — Encounter (HOSPITAL_COMMUNITY): Payer: Self-pay | Admitting: Cardiology

## 2024-06-15 DIAGNOSIS — I5023 Acute on chronic systolic (congestive) heart failure: Secondary | ICD-10-CM | POA: Diagnosis not present

## 2024-06-15 DIAGNOSIS — N1832 Chronic kidney disease, stage 3b: Secondary | ICD-10-CM

## 2024-06-15 DIAGNOSIS — D649 Anemia, unspecified: Secondary | ICD-10-CM | POA: Diagnosis not present

## 2024-06-15 DIAGNOSIS — I480 Paroxysmal atrial fibrillation: Secondary | ICD-10-CM | POA: Diagnosis not present

## 2024-06-15 DIAGNOSIS — E871 Hypo-osmolality and hyponatremia: Secondary | ICD-10-CM | POA: Diagnosis not present

## 2024-06-15 DIAGNOSIS — R570 Cardiogenic shock: Secondary | ICD-10-CM | POA: Diagnosis not present

## 2024-06-15 DIAGNOSIS — J9601 Acute respiratory failure with hypoxia: Secondary | ICD-10-CM

## 2024-06-15 LAB — RENAL FUNCTION PANEL
Albumin: 3 g/dL — ABNORMAL LOW (ref 3.5–5.0)
Anion gap: 11 (ref 5–15)
BUN: 85 mg/dL — ABNORMAL HIGH (ref 8–23)
CO2: 23 mmol/L (ref 22–32)
Calcium: 8.2 mg/dL — ABNORMAL LOW (ref 8.9–10.3)
Chloride: 97 mmol/L — ABNORMAL LOW (ref 98–111)
Creatinine, Ser: 3.19 mg/dL — ABNORMAL HIGH (ref 0.61–1.24)
GFR, Estimated: 21 mL/min — ABNORMAL LOW (ref >60)
Glucose, Bld: 166 mg/dL — ABNORMAL HIGH (ref 70–99)
Phosphorus: 5.7 mg/dL — ABNORMAL HIGH (ref 2.5–4.6)
Potassium: 4.8 mmol/L (ref 3.5–5.1)
Sodium: 131 mmol/L — ABNORMAL LOW (ref 135–145)

## 2024-06-15 LAB — CBC
HCT: 23.6 % — ABNORMAL LOW (ref 39.0–52.0)
Hemoglobin: 7.7 g/dL — ABNORMAL LOW (ref 13.0–17.0)
MCH: 32.1 pg (ref 26.0–34.0)
MCHC: 32.6 g/dL (ref 30.0–36.0)
MCV: 98.3 fL (ref 80.0–100.0)
Platelets: 321 K/uL (ref 150–400)
RBC: 2.4 MIL/uL — ABNORMAL LOW (ref 4.22–5.81)
RDW: 14.6 % (ref 11.5–15.5)
WBC: 10.5 K/uL (ref 4.0–10.5)
nRBC: 0 % (ref 0.0–0.2)

## 2024-06-15 LAB — BASIC METABOLIC PANEL WITH GFR
Anion gap: 13 (ref 5–15)
Anion gap: 10 (ref 5–15)
BUN: 85 mg/dL — ABNORMAL HIGH (ref 8–23)
BUN: 84 mg/dL — ABNORMAL HIGH (ref 8–23)
CO2: 22 mmol/L (ref 22–32)
CO2: 20 mmol/L — ABNORMAL LOW (ref 22–32)
Calcium: 8.3 mg/dL — ABNORMAL LOW (ref 8.9–10.3)
Calcium: 7.9 mg/dL — ABNORMAL LOW (ref 8.9–10.3)
Chloride: 98 mmol/L (ref 98–111)
Chloride: 100 mmol/L (ref 98–111)
Creatinine, Ser: 3.21 mg/dL — ABNORMAL HIGH (ref 0.61–1.24)
Creatinine, Ser: 3.89 mg/dL — ABNORMAL HIGH (ref 0.61–1.24)
GFR, Estimated: 21 mL/min — ABNORMAL LOW (ref >60)
GFR, Estimated: 17 mL/min — ABNORMAL LOW (ref >60)
Glucose, Bld: 167 mg/dL — ABNORMAL HIGH (ref 70–99)
Glucose, Bld: 166 mg/dL — ABNORMAL HIGH (ref 70–99)
Potassium: 4.9 mmol/L (ref 3.5–5.1)
Potassium: 4.4 mmol/L (ref 3.5–5.1)
Sodium: 133 mmol/L — ABNORMAL LOW (ref 135–145)
Sodium: 130 mmol/L — ABNORMAL LOW (ref 135–145)

## 2024-06-15 LAB — MAGNESIUM: Magnesium: 2.3 mg/dL (ref 1.7–2.4)

## 2024-06-15 LAB — HEPARIN LEVEL (UNFRACTIONATED)
Heparin Unfractionated: 0.11 [IU]/mL — ABNORMAL LOW (ref 0.30–0.70)
Heparin Unfractionated: 0.17 [IU]/mL — ABNORMAL LOW (ref 0.30–0.70)
Heparin Unfractionated: 0.29 [IU]/mL — ABNORMAL LOW (ref 0.30–0.70)

## 2024-06-15 LAB — COOXEMETRY PANEL
Carboxyhemoglobin: 2.4 % — ABNORMAL HIGH (ref 0.5–1.5)
Methemoglobin: 0.7 % (ref 0.0–1.5)
O2 Saturation: 54.3 %
Total hemoglobin: 8.8 g/dL — ABNORMAL LOW (ref 12.0–16.0)

## 2024-06-15 LAB — APTT: aPTT: 31 s (ref 24–36)

## 2024-06-15 MED ORDER — POLYETHYLENE GLYCOL 3350 17 G PO PACK: 17.0000 g | PACK | Freq: Every day | ORAL | Status: DC

## 2024-06-15 MED ORDER — POLYETHYLENE GLYCOL 3350 17 G PO PACK
17.0000 g | PACK | Freq: Two times a day (BID) | ORAL | Status: DC
  Administered 2024-06-15 – 2024-06-16 (×4): 17 g via ORAL
  Filled 2024-06-15 (×4): qty 1

## 2024-06-15 MED ORDER — SODIUM CHLORIDE 0.9 % IV SOLN
200.0000 mg | Freq: Once | INTRAVENOUS | Status: AC
  Administered 2024-06-15: 200 mg via INTRAVENOUS
  Filled 2024-06-15: qty 10

## 2024-06-15 MED ORDER — THIAMINE MONONITRATE 100 MG PO TABS
100.0000 mg | ORAL_TABLET | Freq: Every day | ORAL | Status: DC
  Administered 2024-06-15 – 2024-06-16 (×2): 100 mg via ORAL
  Filled 2024-06-15 (×2): qty 1

## 2024-06-15 MED ORDER — METOLAZONE 5 MG PO TABS
5.0000 mg | ORAL_TABLET | Freq: Two times a day (BID) | ORAL | Status: AC
  Administered 2024-06-15 (×2): 5 mg via ORAL
  Filled 2024-06-15 (×2): qty 1

## 2024-06-15 MED ORDER — FOLIC ACID 1 MG PO TABS
1.0000 mg | ORAL_TABLET | Freq: Every day | ORAL | Status: DC
  Administered 2024-06-15 – 2024-06-16 (×2): 1 mg via ORAL
  Filled 2024-06-15 (×2): qty 1

## 2024-06-15 MED ORDER — ACETAZOLAMIDE 250 MG PO TABS
500.0000 mg | ORAL_TABLET | Freq: Once | ORAL | Status: AC
Start: 2024-06-15 — End: 2024-06-15
  Administered 2024-06-15: 500 mg via ORAL
  Filled 2024-06-15: qty 2

## 2024-06-15 MED ORDER — IRON SUCROSE 200 MG IVPB - SIMPLE MED
200.0000 mg | Freq: Once | Status: DC
  Filled 2024-06-15: qty 110

## 2024-06-15 MED ORDER — PANTOPRAZOLE SODIUM 40 MG PO TBEC
40.0000 mg | DELAYED_RELEASE_TABLET | Freq: Every day | ORAL | Status: DC
  Administered 2024-06-15 – 2024-06-16 (×2): 40 mg via ORAL
  Filled 2024-06-15 (×2): qty 1

## 2024-06-15 NOTE — Progress Notes (Signed)
 PHARMACY - ANTICOAGULATION CONSULT NOTE  Pharmacy Consult for heparin  Indication: atrial fibrillation  Allergies  Allergen Reactions   Nsaids Other (See Comments)    Contraindication due to CKD III   Sglt2 Inhibitors Other (See Comments)    Recurrent UTI    Patient Measurements: Height: 6' 4 (193 cm) Weight: (!) 148.6 kg (327 lb 9.7 oz) IBW/kg (Calculated) : 86.8 HEPARIN  DW (KG): 122.2  Vital Signs: Temp: 98.8 F (37.1 C) (09/28 1210) Temp Source: Core (09/28 0400) BP: 92/63 (09/28 1210) Pulse Rate: 81 (09/28 1210)  Labs: Recent Labs    06/13/24 1435 06/14/24 0503 06/14/24 1302 06/14/24 1405 06/15/24 0211 06/15/24 0429 06/15/24 1214  HGB 8.8* 8.2* 8.5*  8.5*  --   --  7.7*  --   HCT 28.9* 25.8* 25.0*  25.0*  --   --  23.6*  --   PLT 355 306  --   --   --  321  --   APTT  --   --   --   --  31  --   --   HEPARINUNFRC  --   --   --   --  0.11*  --  0.17*  CREATININE 4.47* 3.68*  --  3.82*  --  3.21*  3.19*  --     Estimated Creatinine Clearance: 38.4 mL/min (A) (by C-G formula based on SCr of 3.19 mg/dL (H)).   Medical History: Past Medical History:  Diagnosis Date   AICD (automatic cardioverter/defibrillator) present    Arthritis    Atrial fibrillation (HCC)    Chronic HFrEF (heart failure with reduced ejection fraction) (HCC)    Chronic systolic heart failure (HCC)    Nonischemic CM: echo 4/12 with EF 25-30%, grade 2 diast dysfxn, mild dilated aortic root 43 mm, trivial MR, mod LAE   COPD (chronic obstructive pulmonary disease) (HCC)    Dyspnea    with exertion    H/O ETOH abuse    HTN (hypertension)    Hypersomnia    Mild CAD    Nonischemic cardiomyopathy (HCC)    Obesity, morbid (HCC)    OSA on CPAP    Tobacco use disorder    Ventricular tachycardia (HCC)       Assessment: 75 yoM admitted with cardiogenic shock. Pt on rivaroxaban  PTA for hx AF. Pt in renal failure and may potentially need MCS so will hold DOAC and use IV heparin . LD  rivaroxaban  9/25.  aPTT and heparin  levels correlating. Heparin  level remains low at 0.17. No bleeding or infusion issues per RN.  Goal of Therapy:  Heparin  level 0.3-0.7 units/ml Monitor platelets by anticoagulation protocol: Yes   Plan:  Heparin  to 2300 units/h Check heparin  level in 6h   Ozell Jamaica, PharmD, [place], [facility] Clinical Pharmacist [phone] Please check AMION for all [facility] Pharmacy numbers 06/15/2024

## 2024-06-15 NOTE — Consult Note (Signed)
 NAMEKeval [name], MRN:  [id number], DOB:  May 09, 1961, LOS: 2 ADMISSION DATE:  06/13/2024, CONSULTATION DATE:  06/14/2024 REFERRING MD:  TRH, CHIEF COMPLAINT:  Cardiogenic shock    History of Present Illness:  [name] [name] is a 63 y.o. male with a PMH significant for HFrEF, severely dilated LV, nonischemic cardiomyopathy, recurrent/sustained V. tach requiring episodic ICD shocks, presence of biventricular ICD, paroxysmal atrial fibrillation, CKD stage IIIb, OSA on BiPAP, HTN, anxiety, and depression who presented to the ED at [facility] 9/26 for complaints of shortness of breath and nausea. Workup on admission revealed decompensated CHF with concerns to progression of cardiogenic shock prompting PCCM and heart failure consult.   Pertinent  Medical History  HFrEF, severely dilated LV, nonischemic cardiomyopathy, recurrent/sustained V. tach requiring episodic ICD shocks, presence of biventricular ICD, paroxysmal atrial fibrillation, CKD stage IIIb, OSA on BiPAP, HTN, anxiety, and depression   Significant Hospital Events: Including procedures, antibiotic start and stop dates in addition to other pertinent events   9/26 presented to [name] ED for complaints of shortness of breath, weight gain, and nausea  9/27 required initiation of vasopressor support for persistent hypotension in the setting of evolving cardiogenic shock. PCCM and HF consulted   Interim History / Subjective:  Overnight Levophed requirement went up, now slowly trending down, currently on 10 mics of Levophed milrinone at 0.25 amiodarone  at 30 and Lasix  was increased to 30 mg/h Remain afebrile Complaining of abdominal distention and shortness of breath Denies chest pain  Objective    Blood pressure (!) 146/108, pulse 84, temperature 98.8 F (37.1 C), resp. rate (!) 21, height 6' 4 (1.93 m), weight (!) 148.6 kg, SpO2 98%. PAP: (19-56)/(16-50) 27/21 CVP:  [7 mmHg-26 mmHg] 20 mmHg CO:  [7.1 L/min-10 L/min] 9.9 L/min CI:   [2.6 L/min/m2-3.64 L/min/m2] 3.6 L/min/m2      Intake/Output Summary (Last 24 hours) at 06/15/2024 9071 Last data filed at 06/15/2024 0845 Gross per 24 hour  Intake 3341.77 ml  Output 3150 ml  Net 191.77 ml   Filed Weights   06/13/24 1405 06/15/24 0600  Weight: (!) 154.2 kg (!) 148.6 kg    Examination: General: Acute on chronically ill-appearing morbidly obese male, lying on the bed HEENT: [place]/AT, eyes anicteric.  moist mucus membranes Neuro: Alert, awake following commands Chest: Bilateral basal crackles, no wheezes or rhonchi Heart: Paced rhythm, no murmurs or gallops Abdomen: Soft, nontender, distended, sluggish bowel sounds present  Labs and images reviewed  Patient Lines/Drains/Airways Status     Active Line/Drains/Airways     Name Placement date Placement time Site Days   Peripheral IV 06/13/24 20 G 1 Anterior;Distal;Right;Upper Arm 06/13/24  1538  Arm  2   Peripheral IV 06/14/24 18 G Left Antecubital 06/14/24  1054  Antecubital  1   Sheath 06/14/24 Left Internal Jugular 06/14/24  --  Internal Jugular  1   External Urinary Catheter 06/14/24  1440  --  1   Pulmonary Artery Catheter 06/14/24 Left 06/14/24  1234  -- 1        Resolved problem list   Assessment and Plan  Acute on chronic HFrEF due to nonischemic cardiomyopathy now with cardiogenic shock  Hypervolemic hyponatremia Acute respiratory failure with hypoxia due to pulmonary edema Paroxysmal A-fib Prior history of recurrent VT's status post biventricular AICD AKI on CKD stage IIIb Hyperkalemia, resolved Anemia of chronic disease Morbid obesity/obstructive sleep apnea Alcohol abuse Obstipation with abdominal distention  Advanced heart failure team is following Most recent echo  showing EF 20 to 25% with global hypokinesis normal RV function Currently on milrinone at 0.25, norepinephrine, Lasix  infusion and amiodarone  infusion Coox 54% Monitor intake and output GDMT is limited by shock Closely monitor  electrolytes Continue titrate nasal cannula oxygen, down to 8 L now Patient is in paced rhythm at this time Continue IV heparin  infusion Serum creatinine started improving, down from 4.4 upon admission now to 3.2 Avoid nephrotoxic agent Serum potassium has normalized Monitor H&H and transfuse if less than 7 Continue BiPAP at night Watch for signs of withdrawal Continue folate and thiamine  Started on aggressive bowel regimen Diet and exercise counseling provided   Labs   CBC: Recent Labs  Lab 06/13/24 1435 06/14/24 0503 06/14/24 1302 06/15/24 0429  WBC 10.8* 9.7  --  10.5  HGB 8.8* 8.2* 8.5*  8.5* 7.7*  HCT 28.9* 25.8* 25.0*  25.0* 23.6*  MCV 101.8* 101.6*  --  98.3  PLT 355 306  --  321    Basic Metabolic Panel: Recent Labs  Lab 06/13/24 1435 06/14/24 0503 06/14/24 1302 06/14/24 1405 06/15/24 0429  NA 130* 132* 132*  132* 131* 133*  131*  K 5.6* 5.5* 5.8*  5.7* 5.5* 4.9  4.8  CL 94* 100  --  98 98  97*  CO2 22 19*  --  22 22  23   GLUCOSE 139* 135*  --  162* 167*  166*  BUN 73* 80*  --  88* 85*  85*  CREATININE 4.47* 3.68*  --  3.82* 3.21*  3.19*  CALCIUM  9.2 8.1*  --  8.7* 8.3*  8.2*  MG  --   --   --   --  2.3  PHOS  --   --   --   --  5.7*   GFR: Estimated Creatinine Clearance: 38.4 mL/min (A) (by C-G formula based on SCr of 3.19 mg/dL (H)). Recent Labs  Lab 06/13/24 1435 06/13/24 1649 06/14/24 0503 06/14/24 1040 06/14/24 1418 06/15/24 0429  WBC 10.8*  --  9.7  --   --  10.5  LATICACIDVEN  --  1.1  --  0.9 <0.3*  --     Liver Function Tests: Recent Labs  Lab 06/13/24 1435 06/15/24 0429  AST 15  --   ALT 27  --   ALKPHOS 112  --   BILITOT 0.3  --   PROT 6.8  --   ALBUMIN  3.8 3.0*   No results for input(s): LIPASE, AMYLASE in the last 168 hours. No results for input(s): AMMONIA in the last 168 hours.  ABG    Component Value Date/Time   PHART 7.450 12/25/2016 1030   PCO2ART 40.8 12/25/2016 1030   PO2ART 70.0 (L)  12/25/2016 1030   HCO3 24.3 06/14/2024 1302   HCO3 25.5 06/14/2024 1302   TCO2 26 06/14/2024 1302   TCO2 27 06/14/2024 1302   ACIDBASEDEF 2.0 06/14/2024 1302   ACIDBASEDEF 1.0 06/14/2024 1302   O2SAT 54.3 06/15/2024 0215     Coagulation Profile: No results for input(s): INR, PROTIME in the last 168 hours.  Cardiac Enzymes: No results for input(s): CKTOTAL, CKMB, CKMBINDEX, TROPONINI in the last 168 hours.  HbA1C: Hgb A1c MFr Bld  Date/Time Value Ref Range Status  02/02/2021 11:04 AM 5.8 (H) 4.8 - 5.6 % Final    Comment:             Prediabetes: 5.7 - 6.4          Diabetes: >6.4  Glycemic control for adults with diabetes: <7.0   12/25/2016 02:14 PM 5.2 4.8 - 5.6 % Final    Comment:    (NOTE)         Pre-diabetes: 5.7 - 6.4         Diabetes: >6.4         Glycemic control for adults with diabetes: <7.0     CBG: No results for input(s): GLUCAP in the last 168 hours.  The patient is critically ill due to acute on chronic HFrEF with cardiogenic shock requiring titration of vasopressors.  AKI on CKD stage IIIb.  Critical care was necessary to treat or prevent imminent or life-threatening deterioration.  Critical care was time spent personally by me on the following activities: development of treatment plan with patient and/or surrogate as well as nursing, discussions with consultants, evaluation of patient's response to treatment, examination of patient, obtaining history from patient or surrogate, ordering and performing treatments and interventions, ordering and review of laboratory studies, ordering and review of radiographic studies, pulse oximetry, re-evaluation of patient's condition and participation in multidisciplinary rounds.   During this encounter critical care time was devoted to patient care services described in this note for 44 minutes.     Valinda Novas, MD [place] [facility] See Amion for pager If no response to pager, please  call [phone] until 7pm After 7pm, Please call E-link [phone]

## 2024-06-15 NOTE — Progress Notes (Signed)
 PHARMACY - ANTICOAGULATION CONSULT NOTE  Pharmacy Consult for heparin  Indication: atrial fibrillation  Labs: Recent Labs    06/13/24 1435 06/14/24 0503 06/14/24 1302 06/14/24 1405 06/15/24 0211  HGB 8.8* 8.2* 8.5*  8.5*  --   --   HCT 28.9* 25.8* 25.0*  25.0*  --   --   PLT 355 306  --   --   --   APTT  --   --   --   --  31  HEPARINUNFRC  --   --   --   --  0.11*  CREATININE 4.47* 3.68*  --  3.82*  --    Assessment: 63yo male subtherapeutic on heparin  with initial dosing while DOAC on hold; no infusion issues or signs of bleeding per RN.  Goal of Therapy:  Heparin  level 0.3-0.7 units/ml   Plan:  Increase heparin  infusion by 3 units/kgABW/hr to 2000 units/hr. Check level in 8 hours.   Marvetta Dauphin, PharmD, BCPS 06/15/2024 3:15 AM

## 2024-06-15 NOTE — Progress Notes (Signed)
 PHARMACY - ANTICOAGULATION CONSULT NOTE  Pharmacy Consult for heparin  Indication: atrial fibrillation  Allergies  Allergen Reactions   Nsaids Other (See Comments)    Contraindication due to CKD III   Sglt2 Inhibitors Other (See Comments)    Recurrent UTI    Patient Measurements: Height: 6' 4 (193 cm) Weight: (!) 148.6 kg (327 lb 9.7 oz) IBW/kg (Calculated) : 86.8 HEPARIN  DW (KG): 122.2  Vital Signs: Temp: 98.8 F (37.1 C) (09/28 2045) Temp Source: Core (09/28 2000) BP: 82/56 (09/28 2045) Pulse Rate: 79 (09/28 2045)  Labs: Recent Labs    06/13/24 1435 06/14/24 0503 06/14/24 1302 06/14/24 1405 06/15/24 0211 06/15/24 0429 06/15/24 1214 06/15/24 1627 06/15/24 2025  HGB 8.8* 8.2* 8.5*  8.5*  --   --  7.7*  --   --   --   HCT 28.9* 25.8* 25.0*  25.0*  --   --  23.6*  --   --   --   PLT 355 306  --   --   --  321  --   --   --   APTT  --   --   --   --  31  --   --   --   --   HEPARINUNFRC  --   --   --   --  0.11*  --  0.17*  --  0.29*  CREATININE 4.47* 3.68*  --  3.82*  --  3.21*  3.19*  --  3.89*  --     Estimated Creatinine Clearance: 31.4 mL/min (A) (by C-G formula based on SCr of 3.89 mg/dL (H)).   Medical History: Past Medical History:  Diagnosis Date   AICD (automatic cardioverter/defibrillator) present    Arthritis    Atrial fibrillation (HCC)    Chronic HFrEF (heart failure with reduced ejection fraction) (HCC)    Chronic systolic heart failure (HCC)    Nonischemic CM: echo 4/12 with EF 25-30%, grade 2 diast dysfxn, mild dilated aortic root 43 mm, trivial MR, mod LAE   COPD (chronic obstructive pulmonary disease) (HCC)    Dyspnea    with exertion    H/O ETOH abuse    HTN (hypertension)    Hypersomnia    Mild CAD    Nonischemic cardiomyopathy (HCC)    Obesity, morbid (HCC)    OSA on CPAP    Tobacco use disorder    Ventricular tachycardia (HCC)       Assessment: [name] [name] admitted with cardiogenic shock. Pt on rivaroxaban  PTA for hx AF. Pt  in renal failure and may potentially need MCS so will hold DOAC and use IV heparin . LD rivaroxaban  9/25.  Heparin  level came back slightly subtherapeutic at 0.29, on 2300 units/hr. No s/sx of bleeding or infusion issues.   Goal of Therapy:  Heparin  level 0.3-0.7 units/ml Monitor platelets by anticoagulation protocol: Yes   Plan:  Increase heparin  infusion to 2450 units/h Check heparin  level in 6 hr Monitor daily HL, CBC, and for s/sx of bleeding    Thank you for allowing pharmacy to participate in this patient's care,  Suzen Sour, PharmD, BCCCP Clinical Pharmacist  Phone: [phone] 06/15/2024 8:54 PM  Please check AMION for all [facility] Pharmacy phone numbers After 10:00 PM, call Main Pharmacy [phone]

## 2024-06-15 NOTE — Progress Notes (Signed)
 eLink Physician-Brief Progress Note Patient Name: Bannon Giammarco DOB: September 28, 1960 MRN: [id number]   Date of Service  06/15/2024  HPI/Events of Note  MAP soft, levophed via central line, max dose set at 10. Asking to increase titration dosing up.  eICU Interventions  Changed ceiling to 0-20 for now.      Intervention Category Intermediate Interventions: Other:  Jodelle ONEIDA Hutching 06/15/2024, 12:41 AM

## 2024-06-15 NOTE — Progress Notes (Signed)
 Patient ID: [name] [name], male   DOB: 18-Jul-1961, 63 y.o.   MRN: [id number]     Advanced Heart Failure Rounding Note  Cardiologist: Ezra Shuck, MD  Chief Complaint: CHF Subjective:    UOP 2600 since cath lab, creatinine 3.82 => 3.21.  Now on milrinone 0.125 + NE 12 (NE increased overnight but now titrating down).   He has been on Lasix  20 mg/hr.  He remains on HFNC 12, pulmonary edema on CXR.   Hgb 7.7, transferrin saturation 16%  Comfortable in bed.   [place]wan: CVP waveform good, PA waveform flattened.  CXR with appropriate position CVP 20 Thermo CI 3.6 Co-ox 54%, Fick CI 2.5  RHC: RHC Procedural Findings: Hemodynamics (mmHg) RA mean 26 RV 48/28 PA 51/29, mean 38 PCWP mean 28 Oxygen saturations: PA 45% AO 88% Cardiac Output (Fick) 7.7  Cardiac Index (Fick) 2.77 PVR 1.3 WU Cardiac Output (Thermo) 8.58 Cardiac Index (Thermo) 3.09 PVR 1.2 WU PAPi 0.88    Objective:   Weight Range: (!) 148.6 kg Body mass index is 39.88 kg/m.   Vital Signs:   Temp:  [97.6 F (36.4 C)-99 F (37.2 C)] 99 F (37.2 C) (09/28 0800) Pulse Rate:  [75-113] 80 (09/28 0800) Resp:  [14-32] 19 (09/28 0800) BP: (63-159)/(39-128) 111/82 (09/28 0800) SpO2:  [77 %-100 %] 97 % (09/28 0800) Weight:  [148.6 kg] 148.6 kg (09/28 0600) Last BM Date :  (PTA)  Weight change: Filed Weights   06/13/24 1405 06/15/24 0600  Weight: (!) 154.2 kg (!) 148.6 kg    Intake/Output:   Intake/Output Summary (Last 24 hours) at 06/15/2024 0839 Last data filed at 06/15/2024 0831 Gross per 24 hour  Intake 3296.77 ml  Output 3150 ml  Net 146.77 ml      Physical Exam    General:  Well appearing. No resp difficulty HEENT: Normal Neck: Supple. JVP 16+. Carotids 2+ bilat; no bruits. No lymphadenopathy or thyromegaly appreciated. Cor: PMI nondisplaced. Regular rate & rhythm. No rubs, gallops or murmurs. Lungs: Clear Abdomen: Soft, nontender, moderately distended. No hepatosplenomegaly. No bruits or masses.  Good bowel sounds. Extremities: No cyanosis, clubbing, rash. 1+ edema 3/4 to knees.  Neuro: Alert & orientedx3, cranial nerves grossly intact. moves all 4 extremities w/o difficulty. Affect pleasant   Telemetry   A-BiV sequential pacing, no VT (personally reviewed)  Labs    CBC Recent Labs    06/14/24 0503 06/14/24 1302 06/15/24 0429  WBC 9.7  --  10.5  HGB 8.2* 8.5*  8.5* 7.7*  HCT 25.8* 25.0*  25.0* 23.6*  MCV 101.6*  --  98.3  PLT 306  --  321   Basic Metabolic Panel Recent Labs    90/72/74 1405 06/15/24 0429  NA 131* 133*  131*  K 5.5* 4.9  4.8  CL 98 98  97*  CO2 22 22  23   GLUCOSE 162* 167*  166*  BUN 88* 85*  85*  CREATININE 3.82* 3.21*  3.19*  CALCIUM  8.7* 8.3*  8.2*  MG  --  2.3  PHOS  --  5.7*   Liver Function Tests Recent Labs    06/13/24 1435 06/15/24 0429  AST 15  --   ALT 27  --   ALKPHOS 112  --   BILITOT 0.3  --   PROT 6.8  --   ALBUMIN  3.8 3.0*   No results for input(s): LIPASE, AMYLASE in the last 72 hours. Cardiac Enzymes No results for input(s): CKTOTAL, CKMB, CKMBINDEX, TROPONINI in the last  72 hours.  BNP: BNP (last 3 results) Recent Labs    03/26/24 1413 04/28/24 1023 06/04/24 0204  BNP 326.3* 611.9* 219.1*    ProBNP (last 3 results) Recent Labs    06/13/24 1435  PROBNP 2,884.0*     D-Dimer No results for input(s): DDIMER in the last 72 hours. Hemoglobin A1C No results for input(s): HGBA1C in the last 72 hours. Fasting Lipid Panel No results for input(s): CHOL, HDL, LDLCALC, TRIG, CHOLHDL, LDLDIRECT in the last 72 hours. Thyroid  Function Tests No results for input(s): TSH, T4TOTAL, T3FREE, THYROIDAB in the last 72 hours.  Invalid input(s): FREET3  Other results:   Imaging    CARDIAC CATHETERIZATION Result Date: 06/14/2024 1. Severely elevated biventricular filling pressures. 2. Cardiac output preserved by Fick and thermodilution. 3. PAPi low suggesting RV  dysfunction. 4. Moderate pulmonary venous hypertension     Medications:     Scheduled Medications:  acetaZOLAMIDE  500 mg Oral Once   atorvastatin   80 mg Oral QPM   Chlorhexidine  Gluconate Cloth  6 each Topical Daily   metolazone   5 mg Oral BID    Infusions:  sodium chloride  10 mL/hr at 06/15/24 0800   amiodarone  30 mg/hr (06/15/24 0800)   furosemide  (LASIX ) 200 mg in dextrose  5 % 100 mL (2 mg/mL) infusion 20 mg/hr (06/15/24 0800)   heparin  2,000 Units/hr (06/15/24 0800)   milrinone 0.125 mcg/kg/min (06/15/24 0800)   norepinephrine (LEVOPHED) Adult infusion 12 mcg/min (06/15/24 0800)    PRN Medications: acetaminophen  **OR** acetaminophen , albuterol , butalbital -acetaminophen -caffeine , ondansetron  **OR** ondansetron  (ZOFRAN ) IV, mouth rinse, oxyCODONE     Assessment/Plan   1. Acute on chronic systolic CHF: NICM, Echo 12/2016 EF 15%. Minimal CAD on cath in July 2016 and again in 9/24. [employer] CRT-D device. ETOH vs HTN vs myocarditis as cause of cardiomyopathy. Echo 12/22 and again in 9/24 showed that EF remains 20-25%.  PYP scan not suggestive of TTR cardiac amyloidosis.  RHC 5/25 showed R>L heart failure with preserved cardiac output by Fick and thermo but low PAPi.  He historically has very frequent NSVT and VT.  Last echo in 8/25 showed EF 20-25%, normal RV function per report, mild MR.  He was admitted with low BP (baseline SBP 90s) and marked volume overload.  Started on peripheral norepinephrine.  RHC done showed markedly elevated biventricular filling pressures but cardiac index normal by Fick and thermodilution and lactate normal.  PAPi was low.  Initially I had expected he would need Impella but did not place with normal lactate and good cardiac output.  Milrinone begun for RV support at 0.125 mcg/kg/min.  Overnight, NE escalated but now titrating back down, currently on at 12.  Today, CI 3.6 by thermo and 2.5 calculated Fick, CVP remains 20, PA waveform poor. He is markedly  volume overloaded on exam.  UOP 2600 cc overnight.  - Flush PA port to see if we can get it working.  Position is ok on CXR.  - No ventricular arrhythmias, increase milrinone to 0.25 for RV support.  - Wean down on NE, his baseline systolic BP is around 90 (x years).   - Increase Lasix  to 30 mg/hr and will give metolazone  5 mg bid and acetazolamide 500 mg po x 1.  BMET in pm. - Hold Entresto , spironolactone , Toprol  XL.  - He stopped Jardiance  after 3 UTIs, would not start back.  2. Paroxysmal atrial fibrillation: Maintaining SR (a-paced).  - Continue amiodarone .  - Heparin  gtt for now.  3. ETOH abuse: Suspect this  contributes to cardiomyopathy.  He has cut back but still drinks moderately, asked him to cut back further.   4. VT: Had VT ablation at [facility] in 4/25 but still with frequent NSVT and VT.  Had VT with shock earlier this month. No VT over the last day.  - Amiodarone  gtt while on milrinone.  - Hold sotalol  with AKI.  - He did not tolerate quinidine.  - Hold Toprol  XL with marked volume overload.  - He has been referred to EP at [facility] for additional evaluation.  5. Anemia: Hgb 7.7 today, mildly lower (8.8 at admission).  Transferrin saturation 16%. No overt bleeding.  - Will give IV Fe - Hemoccult - Add po Protonix .  - Transfuse hgb < 7 6. OSA: Use BiPAP at night.   7. AKI on CKD stage 3: Creatinine up to 4.47, baseline recently has been around 2.2-2.3.  Suspect cardiorenal with markedly elevated right-sided filling pressures. Creatinine trending down today, 3.82 => 3.21.  - Attempt aggressive diuresis to lower renal venous pressure.   CRITICAL CARE Performed by: Ezra Shuck  Total critical care time: 40 minutes  Critical care time was exclusive of separately billable procedures and treating other patients.  Critical care was necessary to treat or prevent imminent or life-threatening deterioration.  Critical care was time spent personally by me on the following activities:  development of treatment plan with patient and/or surrogate as well as nursing, discussions with consultants, evaluation of patient's response to treatment, examination of patient, obtaining history from patient or surrogate, ordering and performing treatments and interventions, ordering and review of laboratory studies, ordering and review of radiographic studies, pulse oximetry and re-evaluation of patient's condition.   Length of Stay: 2  Ezra Shuck, MD  06/15/2024, 8:39 AM  Advanced Heart Failure Team Pager [phone] (M-F; 7a - 5p)  Please contact [facility] for night-coverage after hours (5p -7a ) and weekends on [email or web address]

## 2024-06-16 ENCOUNTER — Encounter

## 2024-06-16 ENCOUNTER — Inpatient Hospital Stay (HOSPITAL_COMMUNITY)

## 2024-06-16 DIAGNOSIS — I5021 Acute systolic (congestive) heart failure: Secondary | ICD-10-CM | POA: Diagnosis not present

## 2024-06-16 DIAGNOSIS — N179 Acute kidney failure, unspecified: Secondary | ICD-10-CM | POA: Diagnosis not present

## 2024-06-16 DIAGNOSIS — J810 Acute pulmonary edema: Secondary | ICD-10-CM

## 2024-06-16 DIAGNOSIS — I5023 Acute on chronic systolic (congestive) heart failure: Secondary | ICD-10-CM | POA: Diagnosis not present

## 2024-06-16 DIAGNOSIS — D649 Anemia, unspecified: Secondary | ICD-10-CM | POA: Diagnosis not present

## 2024-06-16 DIAGNOSIS — N183 Chronic kidney disease, stage 3 unspecified: Secondary | ICD-10-CM | POA: Diagnosis not present

## 2024-06-16 LAB — CBC
HCT: 21.1 % — ABNORMAL LOW (ref 39.0–52.0)
Hemoglobin: 7 g/dL — ABNORMAL LOW (ref 13.0–17.0)
MCH: 32.3 pg (ref 26.0–34.0)
MCHC: 33.2 g/dL (ref 30.0–36.0)
MCV: 97.2 fL (ref 80.0–100.0)
Platelets: 246 K/uL (ref 150–400)
RBC: 2.17 MIL/uL — ABNORMAL LOW (ref 4.22–5.81)
RDW: 14.7 % (ref 11.5–15.5)
WBC: 9.8 K/uL (ref 4.0–10.5)
nRBC: 0 % (ref 0.0–0.2)

## 2024-06-16 LAB — PREPARE RBC (CROSSMATCH)

## 2024-06-16 LAB — RENAL FUNCTION PANEL
Albumin: 2.6 g/dL — ABNORMAL LOW (ref 3.5–5.0)
Albumin: 3 g/dL — ABNORMAL LOW (ref 3.5–5.0)
Anion gap: 7 (ref 5–15)
Anion gap: 13 (ref 5–15)
BUN: 88 mg/dL — ABNORMAL HIGH (ref 8–23)
BUN: 77 mg/dL — ABNORMAL HIGH (ref 8–23)
CO2: 23 mmol/L (ref 22–32)
CO2: 22 mmol/L (ref 22–32)
Calcium: 8.2 mg/dL — ABNORMAL LOW (ref 8.9–10.3)
Calcium: 8.4 mg/dL — ABNORMAL LOW (ref 8.9–10.3)
Chloride: 96 mmol/L — ABNORMAL LOW (ref 98–111)
Chloride: 93 mmol/L — ABNORMAL LOW (ref 98–111)
Creatinine, Ser: 4.34 mg/dL — ABNORMAL HIGH (ref 0.61–1.24)
Creatinine, Ser: 3.45 mg/dL — ABNORMAL HIGH (ref 0.61–1.24)
GFR, Estimated: 15 mL/min — ABNORMAL LOW (ref >60)
GFR, Estimated: 19 mL/min — ABNORMAL LOW (ref >60)
Glucose, Bld: 180 mg/dL — ABNORMAL HIGH (ref 70–99)
Glucose, Bld: 167 mg/dL — ABNORMAL HIGH (ref 70–99)
Phosphorus: 5.9 mg/dL — ABNORMAL HIGH (ref 2.5–4.6)
Phosphorus: 4.9 mg/dL — ABNORMAL HIGH (ref 2.5–4.6)
Potassium: 4.5 mmol/L (ref 3.5–5.1)
Potassium: 4.4 mmol/L (ref 3.5–5.1)
Sodium: 126 mmol/L — ABNORMAL LOW (ref 135–145)
Sodium: 128 mmol/L — ABNORMAL LOW (ref 135–145)

## 2024-06-16 LAB — COOXEMETRY PANEL
Carboxyhemoglobin: 1.8 % — ABNORMAL HIGH (ref 0.5–1.5)
Methemoglobin: <0.7 % (ref 0.0–1.5)
O2 Saturation: 65.4 %
Total hemoglobin: 7.5 g/dL — ABNORMAL LOW (ref 12.0–16.0)

## 2024-06-16 LAB — HEPARIN LEVEL (UNFRACTIONATED)
Heparin Unfractionated: 0.35 [IU]/mL (ref 0.30–0.70)
Heparin Unfractionated: 0.4 [IU]/mL (ref 0.30–0.70)

## 2024-06-16 LAB — HEMOGLOBIN AND HEMATOCRIT, BLOOD
HCT: 24.7 % — ABNORMAL LOW (ref 39.0–52.0)
Hemoglobin: 8.2 g/dL — ABNORMAL LOW (ref 13.0–17.0)

## 2024-06-16 MED ORDER — SODIUM CHLORIDE 0.9 % FOR CRRT: INTRAVENOUS_CENTRAL | Status: DC | PRN

## 2024-06-16 MED ORDER — PRISMASOL BGK 4/2.5 32-4-2.5 MEQ/L EC SOLN
Status: DC
Start: 2024-06-16 — End: 2024-06-17

## 2024-06-16 MED ORDER — NOREPINEPHRINE 4 MG/250ML-% IV SOLN
INTRAVENOUS | Status: AC
  Administered 2024-06-16: 4 mg via INTRAVENOUS
  Filled 2024-06-16: qty 250

## 2024-06-16 MED ORDER — PRISMASOL BGK 4/2.5 32-4-2.5 MEQ/L EC SOLN: Status: DC

## 2024-06-16 MED ORDER — LIDOCAINE HCL (PF) 1 % IJ SOLN
INTRAMUSCULAR | Status: AC
  Administered 2024-06-16: 5 mL
  Filled 2024-06-16: qty 5

## 2024-06-16 MED ORDER — SODIUM CHLORIDE 0.9% IV SOLUTION: Freq: Once | INTRAVENOUS | Status: AC

## 2024-06-16 MED ORDER — LINEZOLID 600 MG PO TABS
600.0000 mg | ORAL_TABLET | Freq: Once | ORAL | Status: AC
  Administered 2024-06-16: 600 mg via ORAL
  Filled 2024-06-16: qty 1

## 2024-06-16 MED ORDER — HEPARIN SODIUM (PORCINE) 1000 UNIT/ML DIALYSIS
1000.0000 [IU] | INTRAMUSCULAR | Status: DC | PRN
  Administered 2024-06-17: 2400 [IU] via INTRAVENOUS_CENTRAL
  Filled 2024-06-16: qty 6

## 2024-06-16 MED ORDER — NOREPINEPHRINE 16 MG/250ML-% IV SOLN
0.0000 ug/min | INTRAVENOUS | Status: DC
  Administered 2024-06-16: 19 ug/min via INTRAVENOUS
  Administered 2024-06-17: 55 ug/min via INTRAVENOUS
  Administered 2024-06-17: 60 ug/min via INTRAVENOUS
  Administered 2024-06-18: 18 ug/min via INTRAVENOUS
  Administered 2024-06-18: 46 ug/min via INTRAVENOUS
  Administered 2024-06-19: 20 ug/min via INTRAVENOUS
  Administered 2024-06-20: 17 ug/min via INTRAVENOUS
  Administered 2024-06-20: 22 ug/min via INTRAVENOUS
  Administered 2024-06-21: 14.005 ug/min via INTRAVENOUS
  Filled 2024-06-16 (×4): qty 250
  Filled 2024-06-16: qty 500
  Filled 2024-06-16 (×5): qty 250

## 2024-06-16 MED ORDER — VASOPRESSIN 20 UNITS/100 ML INFUSION FOR SHOCK
0.0000 [IU]/min | INTRAVENOUS | Status: DC
Start: 2024-06-16 — End: 2024-06-17
  Administered 2024-06-16 – 2024-06-17 (×3): 0.03 [IU]/min via INTRAVENOUS
  Filled 2024-06-16 (×4): qty 100

## 2024-06-16 NOTE — Progress Notes (Signed)
 Orthopedic Tech Progress Note Patient Details:  [name] [name] Aug 22, 1961 [id number]  Ortho Devices Type of Ortho Device: Ace wrap, Unna boot Ortho Device/Splint Location: BLE Ortho Device/Splint Interventions: Ordered, Application, Adjustment   Post Interventions Patient Tolerated: Well Instructions Provided: Care of device  [name] Pac 06/16/2024, 3:52 PM

## 2024-06-16 NOTE — Progress Notes (Addendum)
 Patient ID: [name] [name], male   DOB: June 07, 1961, 63 y.o.   MRN: [id number]     Advanced Heart Failure Rounding Note  Cardiologist: Ezra Shuck, MD  Chief Complaint: CHF Subjective:    1L UOP on Lasix  30/hr + diamox. Creatinine significantly up to 4.34.   Remains on NE 14, titration up. O2 requirement now up to 15L HFNC  Hgb 7.0, suspect in-part dilutional.  Sitting up in bed. Appears uncomfortable. Reports that dyspnea has been getting worse.   Swan: CVP 21-22 Thermo CI 4.2 Co-ox 65% SVR 310  RHC 9/27: RHC Procedural Findings: Hemodynamics (mmHg) RA mean 26 RV 48/28 PA 51/29, mean 38 PCWP mean 28 Oxygen saturations: PA 45% AO 88% Cardiac Output (Fick) 7.7  Cardiac Index (Fick) 2.77 PVR 1.3 WU Cardiac Output (Thermo) 8.58 Cardiac Index (Thermo) 3.09 PVR 1.2 WU PAPi 0.88   Objective:    Weight Range: (!) 146.8 kg Body mass index is 39.39 kg/m.   Vital Signs:   Temp:  [89.8 F (32.1 C)-99.1 F (37.3 C)] 97.9 F (36.6 C) (09/29 0741) Pulse Rate:  [76-86] 80 (09/29 0741) Resp:  [8-37] 19 (09/29 0741) BP: (64-168)/(37-157) 74/42 (09/29 0715) SpO2:  [81 %-100 %] 93 % (09/29 0741) FiO2 (%):  [50 %-70 %] 70 % (09/29 0400) Weight:  [146.8 kg] 146.8 kg (09/29 0500) Last BM Date :  (PTA)  Weight change: Filed Weights   06/13/24 1405 06/15/24 0600 06/16/24 0500  Weight: (!) 154.2 kg (!) 148.6 kg (!) 146.8 kg   Intake/Output:  Intake/Output Summary (Last 24 hours) at 06/16/2024 0752 Last data filed at 06/16/2024 0700 Gross per 24 hour  Intake 3229 ml  Output 1065 ml  Net 2164 ml    Physical Exam    General: Acutely ill appearing.  Cardiac: JVP to ear. S1 and S2 present. No murmurs Resp: accessory muscle breathing Abdomen: Firm, taut, distended  Extremities: Warm and dry.  2+ BLE edema.  Neuro: Alert and oriented x3. Affect pleasant.   Telemetry   VP 70s (personally reviewed)  Labs    CBC Recent Labs    06/15/24 0429 06/16/24 0420  WBC 10.5  9.8  HGB 7.7* 7.0*  HCT 23.6* 21.1*  MCV 98.3 97.2  PLT 321 246   Basic Metabolic Panel Recent Labs    90/71/74 0429 06/15/24 1627 06/16/24 0420  NA 133*  131* 130* 126*  K 4.9  4.8 4.4 4.5  CL 98  97* 100 96*  CO2 22  23 20* 23  GLUCOSE 167*  166* 166* 180*  BUN 85*  85* 84* 88*  CREATININE 3.21*  3.19* 3.89* 4.34*  CALCIUM  8.3*  8.2* 7.9* 8.2*  MG 2.3  --   --   PHOS 5.7*  --  5.9*   Liver Function Tests Recent Labs    06/13/24 1435 06/15/24 0429 06/16/24 0420  AST 15  --   --   ALT 27  --   --   ALKPHOS 112  --   --   BILITOT 0.3  --   --   PROT 6.8  --   --   ALBUMIN  3.8 3.0* 2.6*   BNP (last 3 results) Recent Labs    03/26/24 1413 04/28/24 1023 06/04/24 0204  BNP 326.3* 611.9* 219.1*   ProBNP (last 3 results) Recent Labs    06/13/24 1435  PROBNP 2,884.0*   Medications:    Scheduled Medications:  atorvastatin   80 mg Oral QPM   Chlorhexidine  Gluconate Cloth  6 each Topical Daily   folic acid   1 mg Oral Daily   pantoprazole   40 mg Oral Daily   polyethylene glycol  17 g Oral BID   thiamine   100 mg Oral Daily    Infusions:  amiodarone  30 mg/hr (06/16/24 0700)   furosemide  (LASIX ) 200 mg in dextrose  5 % 100 mL (2 mg/mL) infusion 30 mg/hr (06/16/24 0725)   heparin  2,450 Units/hr (06/16/24 0725)   milrinone 0.25 mcg/kg/min (06/16/24 0700)   norepinephrine (LEVOPHED) Adult infusion 14 mcg/min (06/16/24 0700)    PRN Medications: acetaminophen  **OR** acetaminophen , albuterol , butalbital -acetaminophen -caffeine , ondansetron  **OR** ondansetron  (ZOFRAN ) IV, mouth rinse, oxyCODONE   Assessment/Plan   1. Acute on chronic systolic CHF: NICM, Echo 12/2016 EF 15%. Minimal CAD on cath in July 2016 and again in 9/24. [employer] CRT-D device. ETOH vs HTN vs myocarditis as cause of cardiomyopathy. Echo 12/22 and again in 9/24 showed that EF remains 20-25%.  PYP scan not suggestive of TTR cardiac amyloidosis.  RHC 5/25 showed R>L heart failure with preserved  cardiac output by Fick and thermo but low PAPi.  He historically has very frequent NSVT and VT.  Last echo in 8/25 showed EF 20-25%, normal RV function per report, mild MR.  He was admitted with low BP (baseline SBP 90s) and marked volume overload.  Started on peripheral norepinephrine.  RHC showed markedly elevated biventricular filling pressures but cardiac index normal by Fick and thermodilution and lactate normal.  PAPi was low.   Milrinone begun for RV support at 0.125 mcg/kg/min. Persistent hypotension, titrating NE up, on 14 today.  Today, CI 4.2, CVP 21-22, rising.   - Add vasopressin. SVR low 310. - No ventricular arrhythmias, increase milrinone to 0.25 for RV support.  - Wean down on NE, his baseline systolic BP is around 90 (x years).   - Stop lasix , will plan to start CVVHD. Discussed with nephrology.  - Hold Entresto , spironolactone , Toprol  XL.  - He stopped Jardiance  after 3 UTIs, would not start back.  2. Paroxysmal atrial fibrillation: Maintaining SR (a-paced).  - Continue amiodarone .  - Heparin  gtt for now.  3. ETOH abuse: Suspect this contributes to cardiomyopathy.  He has cut back but still drinks moderately, asked him to cut back further.   4. VT: Had VT ablation at [facility] in 4/25 but still with frequent NSVT and VT.  Had VT with shock earlier this month. No VT over the last day.  - Amiodarone  gtt while on milrinone.  - Hold sotalol  with AKI.  - He did not tolerate quinidine.  - Hold Toprol  XL with marked volume overload and RV failure - He has been referred to EP at [facility] for additional evaluation.  5. Anemia: Hgb 7.0 today, mildly lower (8.8 at admission), suspect at least in part dilutional.  Transferrin saturation 16%. No overt bleeding.  - Plan to give 1U RBCs once started on CRRT, concerned about additional volume with respiratory status.  - Given IV Fe - continue po Protonix .  - Transfuse hgb < 7 6. OSA: Use BiPAP at night.   7. AKI on CKD stage 3: Creatinine up to 4.47,  baseline recently has been around 2.2-2.3.  Suspect cardiorenal with markedly elevated right-sided filling pressures. - has diuresed poorly on maximal diuretic regimen. Will plan to start CVVHD today, however suspect this may be very difficult with hypotension and vasoplegia. 8. Acute Hypoxic Respiratory Failure: due to pulmonary edema. Has had an increasing O2 requirement, concerned about respiratory compromise if do not get  volume net negative. Currently on 10 L oximizer. Plan as above  CRITICAL CARE Performed by: [place] Lee  Total critical care time: 15 minutes  -Critical care time was exclusive of separately billable procedures and treating other patients. -Critical care was necessary to treat or prevent imminent or life-threatening deterioration. -Critical care was time spent personally by me on the following activities: development of treatment plan with patient and/or surrogate as well as nursing, discussions with consultants, evaluation of patient's response to treatment, examination of patient, obtaining history from patient or surrogate, ordering and performing treatments and interventions, ordering and review of laboratory studies, ordering and review of radiographic studies, pulse oximetry and re-evaluation of patient's condition.  Length of Stay: 3  [place] Lee, NP  06/16/2024, 7:52 AM  Advanced Heart Failure Team Pager [phone] (M-F; 7a - 5p)  Please contact [facility] for night-coverage after hours (5p -7a ) and weekends on [email or web address]  Agree with above.   Remains on NE. Markedly volume overload with no response to lasix . We will start CVVHD for volume removal (suspect 30-40 pounds).   General:  Uncomfortable  No resp difficulty HEENT: normal Neck: supple. JVP to ear  internal jugular HD cath Cor: . Regular rate & rhythm. No rubs, gallops or murmurs. Lungs: crackles Abdomen: , ++ distended. No hepatosplenomegaly. No bruits or masses. Good bowel sounds. Extremities: no  cyanosis, clubbing, rash, 3-4+ edema Neuro: alert & orientedx3, cranial nerves grossly intact. moves all 4 extremities w/o difficulty. Affect pleasant  Agree with CVVHD for volume removal WIll start at 100/hr and titrate up. Continue NE for BP support. Conitnue IV amio. GIve 1u RBC and IV iron.   CRITICAL CARE Performed by: Cherrie Sieving  Total critical care time: 42 minutes  Critical care time was exclusive of separately billable procedures and treating other patients.  Critical care was necessary to treat or prevent imminent or life-threatening deterioration.  Critical care was time spent personally by me (independent of midlevel providers or residents) on the following activities: development of treatment plan with patient and/or surrogate as well as nursing, discussions with consultants, evaluation of patient's response to treatment, examination of patient, obtaining history from patient or surrogate, ordering and performing treatments and interventions, ordering and review of laboratory studies, ordering and review of radiographic studies, pulse oximetry and re-evaluation of patient's condition.  Sieving Cherrie, MD  11:03 PM     Wean NE as tolerated. Agree with med adjustments as above. Transfuse 1u RBC. Continue heparin 

## 2024-06-16 NOTE — Procedures (Signed)
 Central Venous Catheter Insertion Procedure Note  Garv Kuechle  [id number]  01/30/1961  Date:06/16/24  Time:10:39 AM   Provider Performing:Pete E Jenna   Procedure: Insertion of Non-tunneled Central Venous Catheter(36556)with US  guidance (23062)    Indication(s) Hemodialysis  Consent Risks of the procedure as well as the alternatives and risks of each were explained to the patient and/or caregiver.  Consent for the procedure was obtained and is signed in the bedside chart  Anesthesia Topical only with 1% lidocaine    Timeout Verified patient identification, verified procedure, site/side was marked, verified correct patient position, special equipment/implants available, medications/allergies/relevant history reviewed, required imaging and test results available.  Sterile Technique Maximal sterile technique including full sterile barrier drape, hand hygiene, sterile gown, sterile gloves, mask, hair covering, sterile ultrasound probe cover (if used).  Procedure Description Area of catheter insertion was cleaned with chlorhexidine  and draped in sterile fashion.   With real-time ultrasound guidance a HD catheter was placed into the right internal jugular vein.  Nonpulsatile blood flow and easy flushing noted in all ports.  The catheter was sutured in place and sterile dressing applied.  Complications/Tolerance None; patient tolerated the procedure well. Chest X-ray is ordered to verify placement for internal jugular or subclavian cannulation.  Chest x-ray is not ordered for femoral cannulation.  EBL Minimal  Specimen(s) None

## 2024-06-16 NOTE — Progress Notes (Signed)
 PHARMACY - ANTICOAGULATION CONSULT NOTE  Pharmacy Consult for heparin  Indication: atrial fibrillation  Allergies  Allergen Reactions   Nsaids Other (See Comments)    Contraindication due to CKD III   Sglt2 Inhibitors Other (See Comments)    Recurrent UTI    Patient Measurements: Height: 6' 4 (193 cm) Weight: (!) 146.8 kg (323 lb 10.2 oz) IBW/kg (Calculated) : 86.8 HEPARIN  DW (KG): 122.2  Vital Signs: Temp: 98.6 F (37 C) (09/29 1500) Temp Source: Core (09/29 0800) BP: 116/66 (09/29 1500) Pulse Rate: 79 (09/29 1500)  Labs: Recent Labs    06/14/24 0503 06/14/24 1302 06/14/24 1405 06/15/24 0211 06/15/24 0429 06/15/24 1214 06/15/24 1627 06/15/24 2025 06/16/24 0420 06/16/24 1213  HGB 8.2* 8.5*  8.5*  --   --  7.7*  --   --   --  7.0*  --   HCT 25.8* 25.0*  25.0*  --   --  23.6*  --   --   --  21.1*  --   PLT 306  --   --   --  321  --   --   --  246  --   APTT  --   --   --  31  --   --   --   --   --   --   HEPARINUNFRC  --   --   --  0.11*  --    < >  --  0.29* 0.35 0.40  CREATININE 3.68*  --    < >  --  3.21*  3.19*  --  3.89*  --  4.34*  --    < > = values in this interval not displayed.    Estimated Creatinine Clearance: 28 mL/min (A) (by C-G formula based on SCr of 4.34 mg/dL (H)).   Medical History: Past Medical History:  Diagnosis Date   AICD (automatic cardioverter/defibrillator) present    Arthritis    Atrial fibrillation (HCC)    Chronic HFrEF (heart failure with reduced ejection fraction) (HCC)    Chronic systolic heart failure (HCC)    Nonischemic CM: echo 4/12 with EF 25-30%, grade 2 diast dysfxn, mild dilated aortic root 43 mm, trivial MR, mod LAE   COPD (chronic obstructive pulmonary disease) (HCC)    Dyspnea    with exertion    H/O ETOH abuse    HTN (hypertension)    Hypersomnia    Mild CAD    Nonischemic cardiomyopathy (HCC)    Obesity, morbid (HCC)    OSA on CPAP    Tobacco use disorder    Ventricular tachycardia (HCC)        Assessment: 15 yoM admitted with cardiogenic shock. Pt on rivaroxaban  PTA for hx AF. Pt in renal failure and may potentially need MCS so will hold DOAC and use IV heparin . LD rivaroxaban  9/25.  Heparin  level this afternoon is within goal range.  No overt bleeding or complications noted.  Goal of Therapy:  Heparin  level 0.3-0.7 units/ml Monitor platelets by anticoagulation protocol: Yes   Plan:  Continue IV heparin  infusion at 2450 units/h  Monitor daily heparin  level, CBC, and for s/sx of bleeding    Thank you for allowing pharmacy to participate in this patient's care,  Harlene Denna Berdine JONETTA ARABELLA, [facility] Clinical Pharmacist  06/16/2024 3:54 PM   [facility] pharmacy phone numbers are listed on [email or web address]

## 2024-06-16 NOTE — Progress Notes (Signed)
 PHARMACY - ANTICOAGULATION CONSULT NOTE  Pharmacy Consult for heparin  Indication: atrial fibrillation  Labs: Recent Labs    06/14/24 0503 06/14/24 1302 06/14/24 1302 06/14/24 1405 06/15/24 0211 06/15/24 0429 06/15/24 1214 06/15/24 1627 06/15/24 2025 06/16/24 0420  HGB 8.2* 8.5*  8.5*  --   --   --  7.7*  --   --   --  7.0*  HCT 25.8* 25.0*  25.0*  --   --   --  23.6*  --   --   --  21.1*  PLT 306  --   --   --   --  321  --   --   --  246  APTT  --   --   --   --  31  --   --   --   --   --   HEPARINUNFRC  --   --    < >  --  0.11*  --  0.17*  --  0.29* 0.35  CREATININE 3.68*  --   --  3.82*  --  3.21*  3.19*  --  3.89*  --   --    < > = values in this interval not displayed.   Assessment/Plan:  63yo male therapeutic on heparin  after rate change. Will continue infusion at current rate of 2450 units/hr and confirm stable with additional level.  Marvetta Dauphin, PharmD, BCPS 06/16/2024 4:51 AM

## 2024-06-16 NOTE — Consult Note (Signed)
 Nephrology Consult   Requesting provider: Winston Ruth, NP Service requesting consult: HF Reason for consult: CRRT   Assessment/Recommendations: [name] [name] is a/an 63 y.o. male with a past medical history notable for AKI    Oliguric/Anuric AKI (worsening): Likely secondary to cardiogenic shock in the setting of severe HFrEF, severely dilated LV, NICM -He failed conservative measures with high-dose IV diuretics.  Worsening clinical status. -Chart reviewed: (medications acceptable, does not appear to have been exposed to nephrotoxins with imaging or had episodes of significant hypotension).   -Continue to monitor daily Cr, Dose meds for GFR<15 -Monitor Daily I/Os, Daily weight  -Maintain MAP>65 for optimal renal perfusion.  -Agree with holding ACE-I, avoid further nephrotoxins including NSAIDS, Morphine .  Unless absolutely necessary, avoid CT with contrast and/or MRI with gadolinium.    -Start CRRT: 9/29-now  Volume Status: Appears volume overloaded on exam. Based on our examination and review of available imaging, our recommendation is CRRT as above.  Cardiogenic shock: In the setting of acute on chronic systolic heart failure.  Followed by HF.  Failed conservative measures.  Starting CRRT as above  Electrolytes  Hyponatremia: Worsening likely secondary to heart failure exacerbation.  Should improve with CRRT.  Monitor for now. Hyperphosphatemia: Likely secondary to worsening renal status.  Will likely improve with CRRT.  Monitor for now.  # Normocytic anemia: Worsening.  Likely acute on chronic. -Transfuse for Hgb<7 g/dL - Defer to primary -No role for ESA in setting of AKI   Recommendations conveyed to primary service.    [name] Euleta Belson [facility] 06/16/2024 8:12 AM   _____________________________________________________________________________________   History of Present Illness: [name] [name] is a/an 63 y.o. male with a past medical history of CKD 3B,  HFrEF, severely dilated LV, NICM, recurrent/sustained V. tach requiring episodic ICD shocks,BiV ICD, pAF, OSA on BiPAP, HTN, anxiety depression who presents to [facility] with decompensated heart failure c/f cardiogenic shock.  Nephrology consulted for CRRT in the setting of failed conservative management.  Initially presented [facility] on 06/13/24 for SOB, weight gain, nausea.  Underwent heart cath on 9/27 revealing severely elevated biventricular filling pressures.  Started on peripheral pressor support.  Started on diuretics: Currently on Diamox 500, metolazone  5 twice daily, IV Lasix  drip at 30 mg an hour.  UOP 2.6 L, however remains significantly volume overloaded.  Transition between BiPAP and HFNC.  Seen in CCU.  Daughter at bedside.  Went over recent events.  He has noticed decreased urine output over the last day.  He feels more short of breath and having more abdominal edema.  Remains on high-dose Levophed, vasopressin started, IV Lasix .  Spoke about needing RRT.  Patient in agreement.   Medications:  Current Facility-Administered Medications  Medication Dose Route Frequency Provider Last Rate Last Admin   acetaminophen  (TYLENOL ) tablet 650 mg  650 mg Oral Q6H PRN Zella, Mir M, MD   650 mg at 06/15/24 1449   Or   acetaminophen  (TYLENOL ) suppository 650 mg  650 mg Rectal Q6H PRN Zella, Mir M, MD       albuterol  (PROVENTIL ) (2.5 MG/3ML) 0.083% nebulizer solution 2.5 mg  2.5 mg Nebulization Q2H PRN Zella, Mir M, MD       amiodarone  (NEXTERONE  PREMIX) 360-4.14 MG/200ML-% (1.8 mg/mL) IV infusion  30 mg/hr Intravenous Continuous [name], Dalton S, MD 16.67 mL/hr at 06/16/24 0700 30 mg/hr at 06/16/24 0700   atorvastatin  (LIPITOR ) tablet 80 mg  80 mg Oral QPM Zella Mir M, MD   80 mg at 06/15/24  1701   butalbital -acetaminophen -caffeine  (FIORICET) 50-325-40 MG per tablet 1 tablet  1 tablet Oral Q6H PRN Zella Katha HERO, MD   1 tablet at 06/14/24 0327   Chlorhexidine  Gluconate Cloth 2 % PADS 6  each  6 each Topical Daily Harold Scholz, MD   6 each at 06/15/24 1123   folic acid  (FOLVITE ) tablet 1 mg  1 mg Oral Daily Harold Scholz, MD   1 mg at 06/15/24 1123   furosemide  (LASIX ) 200 mg in dextrose  5 % 100 mL (2 mg/mL) infusion  30 mg/hr Intravenous Continuous Rolan Ezra RAMAN, MD 15 mL/hr at 06/16/24 0725 30 mg/hr at 06/16/24 0725   heparin  ADULT infusion 100 units/mL (25000 units/250mL)  2,450 Units/hr Intravenous Continuous Sherryll Suzen SQUIBB, RPH 24.5 mL/hr at 06/16/24 0725 2,450 Units/hr at 06/16/24 0725   milrinone (PRIMACOR) 20 MG/100 ML (0.2 mg/mL) infusion  0.25 mcg/kg/min Intravenous Continuous Rolan Ezra RAMAN, MD 11.57 mL/hr at 06/16/24 0700 0.25 mcg/kg/min at 06/16/24 0700   norepinephrine (LEVOPHED) 4mg  in (0.016 mg/mL) premix infusion  0-20 mcg/min Intravenous Titrated Rober Gianotti T, MD 52.5 mL/hr at 06/16/24 0700 14 mcg/min at 06/16/24 0700   ondansetron  (ZOFRAN ) tablet 4 mg  4 mg Oral Q6H PRN Zella Katha HERO, MD   4 mg at 06/15/24 9065   Or   ondansetron  (ZOFRAN ) injection 4 mg  4 mg Intravenous Q6H PRN Zella Katha HERO, MD       Oral care mouth rinse  15 mL Mouth Rinse PRN Harold Scholz, MD       oxyCODONE  (Oxy IR/ROXICODONE ) immediate release tablet 5 mg  5 mg Oral Q4H PRN Zella, Mir M, MD   5 mg at 06/15/24 2228   pantoprazole  (PROTONIX ) EC tablet 40 mg  40 mg Oral Daily [name], Dalton S, MD   40 mg at 06/15/24 0900   polyethylene glycol (MIRALAX  / GLYCOLAX ) packet 17 g  17 g Oral BID Rolan Ezra RAMAN, MD   17 g at 06/15/24 2228   thiamine  (VITAMIN B1) tablet 100 mg  100 mg Oral Daily Harold Scholz, MD   100 mg at 06/15/24 1123   vasopressin (PITRESSIN) 20 Units in 100 mL (0.2 unit/mL) infusion-*FOR SHOCK*  0-0.04 Units/min Intravenous Continuous Lee, [place], NP         ALLERGIES Nsaids and Sglt2 inhibitors  MEDICAL HISTORY Past Medical History:  Diagnosis Date   AICD (automatic cardioverter/defibrillator) present    Arthritis    Atrial fibrillation  (HCC)    Chronic HFrEF (heart failure with reduced ejection fraction) (HCC)    Chronic systolic heart failure (HCC)    Nonischemic CM: echo 4/12 with EF 25-30%, grade 2 diast dysfxn, mild dilated aortic root 43 mm, trivial MR, mod LAE   COPD (chronic obstructive pulmonary disease) (HCC)    Dyspnea    with exertion    H/O ETOH abuse    HTN (hypertension)    Hypersomnia    Mild CAD    Nonischemic cardiomyopathy (HCC)    Obesity, morbid (HCC)    OSA on CPAP    Tobacco use disorder    Ventricular tachycardia (HCC)      SOCIAL HISTORY Social History   Socioeconomic History   Marital status: Significant Other    Spouse name: Not on file   Number of children: 2   Years of education: Not on file   Highest education level: Not on file  Occupational History   Occupation: scrap metal   Occupation: disabled, odd jobs  Tobacco Use   Smoking status: Former    Current packs/day: 0.00    Average packs/day: 0.5 packs/day for 4.0 years (2.0 ttl pk-yrs)    Types: Cigarettes    Start date: 10/29/2001    Quit date: 10/29/2005    Years since quitting: 18.6   Smokeless tobacco: Never   Tobacco comments:    about a pack a week. social  Vaping Use   Vaping status: Never Used  Substance and Sexual Activity   Alcohol use: Yes    Comment: 12 beers per week    Drug use: Yes    Types: Marijuana   Sexual activity: Yes    Partners: Female  Other Topics Concern   Not on file  Social History Narrative   ** Merged History Encounter **       Social Drivers of Health   Financial Resource Strain: Low Risk  (10/04/2023)   Overall Financial Resource Strain (CARDIA)    Difficulty of Paying Living Expenses: Not hard at all  Food Insecurity: No Food Insecurity (06/14/2024)   Hunger Vital Sign    Worried About Running Out of Food in the Last Year: Never true    Ran Out of Food in the Last Year: Never true  Transportation Needs: No Transportation Needs (06/14/2024)   PRAPARE - Doctor, general practice (Medical): No    Lack of Transportation (Non-Medical): No  Physical Activity: Insufficiently Active (10/04/2023)   Exercise Vital Sign    Days of Exercise per Week: 3 days    Minutes of Exercise per Session: 30 min  Stress: No Stress Concern Present (10/04/2023)   [employer] of Occupational Health - Occupational Stress Questionnaire    Feeling of Stress : Not at all  Social Connections: Socially Isolated (10/04/2023)   Social Connection and Isolation Panel    Frequency of Communication with Friends and Family: Three times a week    Frequency of Social Gatherings with Friends and Family: Twice a week    Attends Religious Services: Never    Database administrator or Organizations: No    Attends Banker Meetings: Never    Marital Status: Divorced  Catering manager Violence: Not At Risk (10/04/2023)   Humiliation, Afraid, Rape, and Kick questionnaire    Fear of Current or Ex-Partner: No    Emotionally Abused: No    Physically Abused: No    Sexually Abused: No     FAMILY HISTORY Family History  Problem Relation Age of Onset   Other Mother        cardiac surgery. late 1990s   Heart Problems Mother        CABG AGE 91   Congestive Heart Failure Father    Healthy Father        AGE 91   Coronary artery disease Other    Healthy Brother        AGE 84   Healthy Sister        AGE 28   Healthy Sister        AGE 88   Healthy Son    Healthy Son    Healthy Daughter      Review of Systems: 12 systems reviewed Otherwise as per HPI, all other systems reviewed and negative  Physical Exam: Vitals:   06/16/24 0715 06/16/24 0741  BP: (!) 74/42   Pulse: 80 80  Resp: (!) 22 19  Temp: 98.1 F (36.7 C) 97.9 F (36.6 C)  SpO2: 98%  93%   No intake/output data recorded.  Intake/Output Summary (Last 24 hours) at 06/16/2024 9187 Last data filed at 06/16/2024 0700 Gross per 24 hour  Intake 2633.64 ml  Output 1065 ml  Net 1568.64 ml   General:  well-appearing, mild distress,  CV: regular rate, normal rhythm, 1-2+ peripheral edema Lungs: BLBS, not tachypneic, on HFNC Abd: soft Skin: no visible lesions or rashes Psych: alert, engaged, appropriate mood and affect Musculoskeletal: no obvious deformities Neuro: normal speech, no gross focal deficits   Test Results Reviewed Lab Results  Component Value Date   NA 126 (L) 06/16/2024   K 4.5 06/16/2024   CL 96 (L) 06/16/2024   CO2 23 06/16/2024   BUN 88 (H) 06/16/2024   CREATININE 4.34 (H) 06/16/2024   GFR 96.53 04/19/2015   CALCIUM  8.2 (L) 06/16/2024   ALBUMIN  2.6 (L) 06/16/2024   PHOS 5.9 (H) 06/16/2024     I have reviewed all relevant outside healthcare records related to the patient's kidney injury.

## 2024-06-16 NOTE — Progress Notes (Signed)
   NAMEKoy [name], MRN:  [id number], DOB:  05-09-1961, LOS: 3 ADMISSION DATE:  06/13/2024, CONSULTATION DATE:  06/14/2024 REFERRING MD:  TRH, CHIEF COMPLAINT:  Cardiogenic shock    History of Present Illness:  [name] [name] is a 63 y.o. male with a PMH significant for HFrEF, severely dilated LV, nonischemic cardiomyopathy, recurrent/sustained V. tach requiring episodic ICD shocks, presence of biventricular ICD, paroxysmal atrial fibrillation, CKD stage IIIb, OSA on BiPAP, HTN, anxiety, and depression who presented to the ED at [facility] 9/26 for complaints of shortness of breath and nausea. Workup on admission revealed decompensated CHF with concerns to progression of cardiogenic shock prompting PCCM and heart failure consult.   Pertinent  Medical History  HFrEF, severely dilated LV, nonischemic cardiomyopathy, recurrent/sustained V. tach requiring episodic ICD shocks, presence of biventricular ICD, paroxysmal atrial fibrillation, CKD stage IIIb, OSA on BiPAP, HTN, anxiety, and depression   Significant Hospital Events: Including procedures, antibiotic start and stop dates in addition to other pertinent events   9/26 presented to [name] [name], ED for complaints of shortness of breath, weight gain, and nausea  9/27 required initiation of vasopressor support for persistent hypotension in the setting of evolving cardiogenic shock. PCCM and HF consulted   Interim History / Subjective:  No events. Unfortunately not making urine.  Objective    Blood pressure 106/72, pulse 79, temperature 98.1 F (36.7 C), resp. rate (!) 24, height 6' 4 (1.93 m), weight (!) 146.8 kg, SpO2 90%. PAP: (16-59)/(10-48) 46/33 CVP:  [5 mmHg-33 mmHg] 23 mmHg CO:  [7.3 L/min-11.5 L/min] 11.5 L/min CI:  [2.7 L/min/m2-4.2 L/min/m2] 4.2 L/min/m2  Vent Mode: BIPAP FiO2 (%):  [50 %-70 %] 70 % Set Rate:  [10 bmp] 10 bmp PEEP:  [5 cmH20-8 cmH20] 8 cmH20   Intake/Output Summary (Last 24 hours) at 06/16/2024 1225 Last data  filed at 06/16/2024 1200 Gross per 24 hour  Intake 2759.74 ml  Output 75 ml  Net 2684.74 ml   Filed Weights   06/13/24 1405 06/15/24 0600 06/16/24 0500  Weight: (!) 154.2 kg (!) 148.6 kg (!) 146.8 kg    Examination: Chronically ill appearing Marked anasarca Lungs diminished bases Heart distant 2/2 body habitus Aox3  Anemia noted Ongoing hyponatremia  Resolved problem list   Assessment and Plan  Acute on chronic HFrEF due to nonischemic cardiomyopathy now with cardiogenic shock  Hypervolemic hyponatremia Acute respiratory failure with hypoxia due to pulmonary edema Paroxysmal A-fib Prior history of recurrent VT's status post biventricular AICD AKI on CKD stage IIIb Hyperkalemia, resolved Anemia of chronic disease Morbid obesity/obstructive sleep apnea Alcohol abuse Obstipation with abdominal distention  - Start CRRT - Levo/vaso for MAP 65 - Inotropes per AHF - Heparin  gtt - at bedtime CPAP - unit of blood  31 min cc time [name] Sharps MD PCCM

## 2024-06-17 ENCOUNTER — Inpatient Hospital Stay (HOSPITAL_COMMUNITY)

## 2024-06-17 DIAGNOSIS — R570 Cardiogenic shock: Secondary | ICD-10-CM | POA: Diagnosis not present

## 2024-06-17 DIAGNOSIS — N179 Acute kidney failure, unspecified: Secondary | ICD-10-CM | POA: Diagnosis not present

## 2024-06-17 DIAGNOSIS — D649 Anemia, unspecified: Secondary | ICD-10-CM | POA: Diagnosis not present

## 2024-06-17 DIAGNOSIS — J810 Acute pulmonary edema: Secondary | ICD-10-CM

## 2024-06-17 DIAGNOSIS — I5021 Acute systolic (congestive) heart failure: Secondary | ICD-10-CM | POA: Diagnosis not present

## 2024-06-17 DIAGNOSIS — N183 Chronic kidney disease, stage 3 unspecified: Secondary | ICD-10-CM | POA: Diagnosis not present

## 2024-06-17 LAB — COMPREHENSIVE METABOLIC PANEL WITH GFR
ALT: 17 U/L (ref 0–44)
AST: 12 U/L — ABNORMAL LOW (ref 15–41)
Albumin: 3.1 g/dL — ABNORMAL LOW (ref 3.5–5.0)
Alkaline Phosphatase: 87 U/L (ref 38–126)
Anion gap: 11 (ref 5–15)
BUN: 50 mg/dL — ABNORMAL HIGH (ref 8–23)
CO2: 23 mmol/L (ref 22–32)
Calcium: 8.3 mg/dL — ABNORMAL LOW (ref 8.9–10.3)
Chloride: 97 mmol/L — ABNORMAL LOW (ref 98–111)
Creatinine, Ser: 3.09 mg/dL — ABNORMAL HIGH (ref 0.61–1.24)
GFR, Estimated: 22 mL/min — ABNORMAL LOW (ref >60)
Glucose, Bld: 207 mg/dL — ABNORMAL HIGH (ref 70–99)
Potassium: 4.8 mmol/L (ref 3.5–5.1)
Sodium: 131 mmol/L — ABNORMAL LOW (ref 135–145)
Total Bilirubin: 0.7 mg/dL (ref 0.0–1.2)
Total Protein: 6.7 g/dL (ref 6.5–8.1)

## 2024-06-17 LAB — POCT I-STAT 7, (LYTES, BLD GAS, ICA,H+H)
Acid-base deficit: 3 mmol/L — ABNORMAL HIGH (ref 0.0–2.0)
Acid-base deficit: 4 mmol/L — ABNORMAL HIGH (ref 0.0–2.0)
Acid-base deficit: 3 mmol/L — ABNORMAL HIGH (ref 0.0–2.0)
Bicarbonate: 22.7 mmol/L (ref 20.0–28.0)
Bicarbonate: 26 mmol/L (ref 20.0–28.0)
Bicarbonate: 24.5 mmol/L (ref 20.0–28.0)
Calcium, Ion: 1.19 mmol/L (ref 1.15–1.40)
Calcium, Ion: 1.25 mmol/L (ref 1.15–1.40)
Calcium, Ion: 1.19 mmol/L (ref 1.15–1.40)
HCT: 26 % — ABNORMAL LOW (ref 39.0–52.0)
HCT: 28 % — ABNORMAL LOW (ref 39.0–52.0)
HCT: 25 % — ABNORMAL LOW (ref 39.0–52.0)
Hemoglobin: 8.8 g/dL — ABNORMAL LOW (ref 13.0–17.0)
Hemoglobin: 9.5 g/dL — ABNORMAL LOW (ref 13.0–17.0)
Hemoglobin: 8.5 g/dL — ABNORMAL LOW (ref 13.0–17.0)
O2 Saturation: 95 %
O2 Saturation: 90 %
O2 Saturation: 95 %
Patient temperature: 101.1
Patient temperature: 37.8
Patient temperature: 38.4
Potassium: 4.7 mmol/L (ref 3.5–5.1)
Potassium: 4.8 mmol/L (ref 3.5–5.1)
Potassium: 5.1 mmol/L (ref 3.5–5.1)
Sodium: 130 mmol/L — ABNORMAL LOW (ref 135–145)
Sodium: 131 mmol/L — ABNORMAL LOW (ref 135–145)
Sodium: 130 mmol/L — ABNORMAL LOW (ref 135–145)
TCO2: 24 mmol/L (ref 22–32)
TCO2: 28 mmol/L (ref 22–32)
TCO2: 26 mmol/L (ref 22–32)
pCO2 arterial: 46.2 mmHg (ref 32–48)
pCO2 arterial: 83.5 mmHg (ref 32–48)
pCO2 arterial: 61 mmHg — ABNORMAL HIGH (ref 32–48)
pH, Arterial: 7.307 — ABNORMAL LOW (ref 7.35–7.45)
pH, Arterial: 7.105 — CL (ref 7.35–7.45)
pH, Arterial: 7.218 — ABNORMAL LOW (ref 7.35–7.45)
pO2, Arterial: 89 mmHg (ref 83–108)
pO2, Arterial: 84 mmHg (ref 83–108)
pO2, Arterial: 100 mmHg (ref 83–108)

## 2024-06-17 LAB — RENAL FUNCTION PANEL
Albumin: 3.1 g/dL — ABNORMAL LOW (ref 3.5–5.0)
Albumin: 2.9 g/dL — ABNORMAL LOW (ref 3.5–5.0)
Anion gap: 13 (ref 5–15)
Anion gap: 9 (ref 5–15)
BUN: 58 mg/dL — ABNORMAL HIGH (ref 8–23)
BUN: 51 mg/dL — ABNORMAL HIGH (ref 8–23)
CO2: 23 mmol/L (ref 22–32)
CO2: 22 mmol/L (ref 22–32)
Calcium: 8.6 mg/dL — ABNORMAL LOW (ref 8.9–10.3)
Calcium: 8.2 mg/dL — ABNORMAL LOW (ref 8.9–10.3)
Chloride: 94 mmol/L — ABNORMAL LOW (ref 98–111)
Chloride: 97 mmol/L — ABNORMAL LOW (ref 98–111)
Creatinine, Ser: 2.57 mg/dL — ABNORMAL HIGH (ref 0.61–1.24)
Creatinine, Ser: 2.94 mg/dL — ABNORMAL HIGH (ref 0.61–1.24)
GFR, Estimated: 28 mL/min — ABNORMAL LOW (ref >60)
GFR, Estimated: 23 mL/min — ABNORMAL LOW (ref >60)
Glucose, Bld: 179 mg/dL — ABNORMAL HIGH (ref 70–99)
Glucose, Bld: 243 mg/dL — ABNORMAL HIGH (ref 70–99)
Phosphorus: 4.2 mg/dL (ref 2.5–4.6)
Phosphorus: 4.4 mg/dL (ref 2.5–4.6)
Potassium: 4.5 mmol/L (ref 3.5–5.1)
Potassium: 4.8 mmol/L (ref 3.5–5.1)
Sodium: 130 mmol/L — ABNORMAL LOW (ref 135–145)
Sodium: 128 mmol/L — ABNORMAL LOW (ref 135–145)

## 2024-06-17 LAB — CBC WITH DIFFERENTIAL/PLATELET
Abs Immature Granulocytes: 0.53 K/uL — ABNORMAL HIGH (ref 0.00–0.07)
Basophils Absolute: 0 K/uL (ref 0.0–0.1)
Basophils Relative: 0 %
Eosinophils Absolute: 0 K/uL (ref 0.0–0.5)
Eosinophils Relative: 0 %
HCT: 25.2 % — ABNORMAL LOW (ref 39.0–52.0)
Hemoglobin: 8.1 g/dL — ABNORMAL LOW (ref 13.0–17.0)
Immature Granulocytes: 3 %
Lymphocytes Relative: 1 %
Lymphs Abs: 0.2 K/uL — ABNORMAL LOW (ref 0.7–4.0)
MCH: 31.3 pg (ref 26.0–34.0)
MCHC: 32.1 g/dL (ref 30.0–36.0)
MCV: 97.3 fL (ref 80.0–100.0)
Monocytes Absolute: 1 K/uL (ref 0.1–1.0)
Monocytes Relative: 5 %
Neutro Abs: 19.9 K/uL — ABNORMAL HIGH (ref 1.7–7.7)
Neutrophils Relative %: 91 %
Platelets: 232 K/uL (ref 150–400)
RBC: 2.59 MIL/uL — ABNORMAL LOW (ref 4.22–5.81)
RDW: 15.9 % — ABNORMAL HIGH (ref 11.5–15.5)
WBC: 21.6 K/uL — ABNORMAL HIGH (ref 4.0–10.5)
nRBC: 0 % (ref 0.0–0.2)

## 2024-06-17 LAB — CBC
HCT: 25.5 % — ABNORMAL LOW (ref 39.0–52.0)
Hemoglobin: 8.3 g/dL — ABNORMAL LOW (ref 13.0–17.0)
MCH: 31.4 pg (ref 26.0–34.0)
MCHC: 32.5 g/dL (ref 30.0–36.0)
MCV: 96.6 fL (ref 80.0–100.0)
Platelets: 205 K/uL (ref 150–400)
RBC: 2.64 MIL/uL — ABNORMAL LOW (ref 4.22–5.81)
RDW: 15.8 % — ABNORMAL HIGH (ref 11.5–15.5)
WBC: 9.5 K/uL (ref 4.0–10.5)
nRBC: 0 % (ref 0.0–0.2)

## 2024-06-17 LAB — COOXEMETRY PANEL
Carboxyhemoglobin: 2.3 % — ABNORMAL HIGH (ref 0.5–1.5)
Carboxyhemoglobin: 0.8 % (ref 0.5–1.5)
Methemoglobin: <0.7 % (ref 0.0–1.5)
Methemoglobin: <0.7 % (ref 0.0–1.5)
O2 Saturation: 52 %
O2 Saturation: 61.7 %
Total hemoglobin: 8.2 g/dL — ABNORMAL LOW (ref 12.0–16.0)
Total hemoglobin: 8.9 g/dL — ABNORMAL LOW (ref 12.0–16.0)

## 2024-06-17 LAB — CG4 I-STAT (LACTIC ACID)
Lactic Acid, Venous: 0.5 mmol/L (ref 0.5–1.9)
Lactic Acid, Venous: 0.5 mmol/L (ref 0.5–1.9)

## 2024-06-17 LAB — HEMOGLOBIN A1C
Hgb A1c MFr Bld: 5.5 % (ref 4.8–5.6)
Mean Plasma Glucose: 111.15 mg/dL

## 2024-06-17 LAB — HEPARIN LEVEL (UNFRACTIONATED)
Heparin Unfractionated: 0.26 [IU]/mL — ABNORMAL LOW (ref 0.30–0.70)
Heparin Unfractionated: 0.37 [IU]/mL (ref 0.30–0.70)
Heparin Unfractionated: 0.46 [IU]/mL (ref 0.30–0.70)

## 2024-06-17 LAB — GLUCOSE, CAPILLARY
Glucose-Capillary: 235 mg/dL — ABNORMAL HIGH (ref 70–99)
Glucose-Capillary: 234 mg/dL — ABNORMAL HIGH (ref 70–99)

## 2024-06-17 LAB — LACTIC ACID, PLASMA
Lactic Acid, Venous: 0.7 mmol/L (ref 0.5–1.9)
Lactic Acid, Venous: 0.6 mmol/L (ref 0.5–1.9)

## 2024-06-17 LAB — MAGNESIUM: Magnesium: 2.2 mg/dL (ref 1.7–2.4)

## 2024-06-17 MED ORDER — VANCOMYCIN VARIABLE DOSE PER UNSTABLE RENAL FUNCTION (PHARMACIST DOSING): Status: DC

## 2024-06-17 MED ORDER — SODIUM CHLORIDE 0.9 % IV SOLN
0.0000 ug/kg/min | INTRAVENOUS | Status: DC
  Filled 2024-06-17: qty 20

## 2024-06-17 MED ORDER — EPINEPHRINE 1 MG/10ML IV SOSY: 0.5000 mg | PREFILLED_SYRINGE | Freq: Once | INTRAVENOUS | Status: AC

## 2024-06-17 MED ORDER — FENTANYL 2500MCG IN NS 250ML (10MCG/ML) PREMIX INFUSION
50.0000 ug/h | INTRAVENOUS | Status: DC
  Administered 2024-06-17: 250 ug/h via INTRAVENOUS
  Administered 2024-06-18: 200 ug/h via INTRAVENOUS
  Administered 2024-06-18: 175 ug/h via INTRAVENOUS
  Administered 2024-06-19: 275 ug/h via INTRAVENOUS
  Administered 2024-06-19: 225 ug/h via INTRAVENOUS
  Administered 2024-06-20: 300 ug/h via INTRAVENOUS
  Administered 2024-06-20 (×2): 275 ug/h via INTRAVENOUS
  Administered 2024-06-21 – 2024-06-22 (×3): 300 ug/h via INTRAVENOUS
  Filled 2024-06-17 (×12): qty 250

## 2024-06-17 MED ORDER — EPINEPHRINE HCL 5 MG/250ML IV SOLN IN NS
0.0000 ug/min | INTRAVENOUS | Status: DC
  Administered 2024-06-17: 19 ug/min via INTRAVENOUS
  Administered 2024-06-17: 20 ug/min via INTRAVENOUS
  Filled 2024-06-17 (×2): qty 250

## 2024-06-17 MED ORDER — LIDOCAINE BOLUS VIA INFUSION
100.0000 mg | Freq: Once | INTRAVENOUS | Status: DC
  Filled 2024-06-17: qty 100

## 2024-06-17 MED ORDER — SUCCINYLCHOLINE CHLORIDE 200 MG/10ML IV SOSY
PREFILLED_SYRINGE | INTRAVENOUS | Status: AC
  Filled 2024-06-17: qty 10

## 2024-06-17 MED ORDER — ONDANSETRON HCL 4 MG PO TABS: 4.0000 mg | ORAL_TABLET | Freq: Four times a day (QID) | ORAL | Status: DC | PRN

## 2024-06-17 MED ORDER — METHYLENE BLUE (ANTIDOTE) 1 % IV SOLN
2.0000 mg/kg | Freq: Once | INTRAVENOUS | Status: AC
  Administered 2024-06-17: 294 mg via INTRAVENOUS
  Filled 2024-06-17: qty 29.4

## 2024-06-17 MED ORDER — POLYETHYLENE GLYCOL 3350 17 G PO PACK
17.0000 g | PACK | Freq: Two times a day (BID) | ORAL | Status: DC
  Administered 2024-06-17 – 2024-06-22 (×10): 17 g
  Filled 2024-06-17 (×10): qty 1

## 2024-06-17 MED ORDER — SENNOSIDES-DOCUSATE SODIUM 8.6-50 MG PO TABS
2.0000 | ORAL_TABLET | Freq: Every day | ORAL | Status: DC
  Administered 2024-06-18 – 2024-06-21 (×4): 2
  Filled 2024-06-17 (×4): qty 2

## 2024-06-17 MED ORDER — ATORVASTATIN CALCIUM 80 MG PO TABS
80.0000 mg | ORAL_TABLET | Freq: Every evening | ORAL | Status: DC
  Administered 2024-06-17 – 2024-06-21 (×5): 80 mg
  Filled 2024-06-17 (×5): qty 1

## 2024-06-17 MED ORDER — PHENYLEPHRINE 80 MCG/ML (10ML) SYRINGE FOR IV PUSH (FOR BLOOD PRESSURE SUPPORT)
PREFILLED_SYRINGE | INTRAVENOUS | Status: AC
  Administered 2024-06-17: 80 ug via INTRAVENOUS
  Filled 2024-06-17: qty 10

## 2024-06-17 MED ORDER — ROCURONIUM BROMIDE 10 MG/ML (PF) SYRINGE: 100.0000 mg | PREFILLED_SYRINGE | Freq: Once | INTRAVENOUS | Status: AC

## 2024-06-17 MED ORDER — ARTIFICIAL TEARS OPHTHALMIC OINT: 1.0000 | TOPICAL_OINTMENT | Freq: Three times a day (TID) | OPHTHALMIC | Status: DC

## 2024-06-17 MED ORDER — SENNOSIDES-DOCUSATE SODIUM 8.6-50 MG PO TABS
2.0000 | ORAL_TABLET | Freq: Every day | ORAL | Status: DC
  Administered 2024-06-17: 2 via ORAL
  Filled 2024-06-17: qty 2

## 2024-06-17 MED ORDER — EPINEPHRINE HCL 5 MG/250ML IV SOLN IN NS
INTRAVENOUS | Status: AC
  Filled 2024-06-17: qty 250

## 2024-06-17 MED ORDER — ETOMIDATE 2 MG/ML IV SOLN
INTRAVENOUS | Status: AC
  Administered 2024-06-17: 20 mg via INTRAVENOUS
  Filled 2024-06-17: qty 20

## 2024-06-17 MED ORDER — ORAL CARE MOUTH RINSE: 15.0000 mL | OROMUCOSAL | Status: DC | PRN

## 2024-06-17 MED ORDER — MIDAZOLAM BOLUS VIA INFUSION
1.0000 mg | INTRAVENOUS | Status: DC | PRN
  Administered 2024-06-17 – 2024-06-22 (×18): 2 mg via INTRAVENOUS

## 2024-06-17 MED ORDER — FENTANYL CITRATE PF 50 MCG/ML IJ SOSY: 25.0000 ug | PREFILLED_SYRINGE | Freq: Once | INTRAMUSCULAR | Status: AC

## 2024-06-17 MED ORDER — PHENYLEPHRINE 80 MCG/ML (10ML) SYRINGE FOR IV PUSH (FOR BLOOD PRESSURE SUPPORT): 80.0000 ug | PREFILLED_SYRINGE | Freq: Once | INTRAVENOUS | Status: AC | PRN

## 2024-06-17 MED ORDER — FENTANYL 2500MCG IN NS 250ML (10MCG/ML) PREMIX INFUSION
0.0000 ug/h | INTRAVENOUS | Status: DC
  Administered 2024-06-17: 25 ug/h via INTRAVENOUS
  Filled 2024-06-17: qty 250

## 2024-06-17 MED ORDER — ACETAMINOPHEN 325 MG PO TABS
650.0000 mg | ORAL_TABLET | Freq: Four times a day (QID) | ORAL | Status: DC | PRN
  Administered 2024-06-17: 650 mg
  Filled 2024-06-17: qty 2

## 2024-06-17 MED ORDER — ROCURONIUM BROMIDE 10 MG/ML (PF) SYRINGE
PREFILLED_SYRINGE | INTRAVENOUS | Status: AC
  Administered 2024-06-17: 100 mg via INTRAVENOUS
  Filled 2024-06-17: qty 10

## 2024-06-17 MED ORDER — VANCOMYCIN HCL 10 G IV SOLR
2500.0000 mg | Freq: Once | INTRAVENOUS | Status: AC
  Administered 2024-06-17: 2500 mg via INTRAVENOUS
  Filled 2024-06-17: qty 20

## 2024-06-17 MED ORDER — INSULIN ASPART 100 UNIT/ML IJ SOLN
0.0000 [IU] | INTRAMUSCULAR | Status: DC
  Administered 2024-06-17: 3 [IU] via SUBCUTANEOUS
  Administered 2024-06-17 – 2024-06-18 (×2): 2 [IU] via SUBCUTANEOUS
  Administered 2024-06-18: 3 [IU] via SUBCUTANEOUS

## 2024-06-17 MED ORDER — MIDAZOLAM HCL 2 MG/2ML IJ SOLN
INTRAMUSCULAR | Status: AC
  Administered 2024-06-17: 2 mg via INTRAVENOUS
  Filled 2024-06-17: qty 8

## 2024-06-17 MED ORDER — BUTALBITAL-APAP-CAFFEINE 50-325-40 MG PO TABS: 1.0000 | ORAL_TABLET | Freq: Four times a day (QID) | ORAL | Status: DC | PRN

## 2024-06-17 MED ORDER — FOLIC ACID 1 MG PO TABS
1.0000 mg | ORAL_TABLET | Freq: Every day | ORAL | Status: DC
  Administered 2024-06-18 – 2024-06-22 (×5): 1 mg
  Filled 2024-06-17 (×5): qty 1

## 2024-06-17 MED ORDER — MIDAZOLAM-SODIUM CHLORIDE 100-0.9 MG/100ML-% IV SOLN
0.0000 mg/h | INTRAVENOUS | Status: DC
  Filled 2024-06-17: qty 100

## 2024-06-17 MED ORDER — MIDAZOLAM HCL 2 MG/2ML IJ SOLN: 2.0000 mg | Freq: Once | INTRAMUSCULAR | Status: AC

## 2024-06-17 MED ORDER — THIAMINE MONONITRATE 100 MG PO TABS
100.0000 mg | ORAL_TABLET | Freq: Every day | ORAL | Status: DC
  Administered 2024-06-18 – 2024-06-22 (×5): 100 mg
  Filled 2024-06-17 (×5): qty 1

## 2024-06-17 MED ORDER — VASOPRESSIN 20 UNITS/100 ML INFUSION FOR SHOCK
0.0000 [IU]/min | INTRAVENOUS | Status: DC
Start: 2024-06-17 — End: 2024-06-23
  Administered 2024-06-17 – 2024-06-22 (×14): 0.04 [IU]/min via INTRAVENOUS
  Filled 2024-06-17 (×8): qty 100
  Filled 2024-06-17: qty 200
  Filled 2024-06-17 (×4): qty 100

## 2024-06-17 MED ORDER — FENTANYL BOLUS VIA INFUSION: 50.0000 ug | INTRAVENOUS | Status: DC | PRN

## 2024-06-17 MED ORDER — FENTANYL CITRATE PF 50 MCG/ML IJ SOSY
PREFILLED_SYRINGE | INTRAMUSCULAR | Status: AC
  Administered 2024-06-17: 100 ug via INTRAVENOUS
  Filled 2024-06-17: qty 2

## 2024-06-17 MED ORDER — FENTANYL BOLUS VIA INFUSION
25.0000 ug | INTRAVENOUS | Status: DC | PRN
  Administered 2024-06-17: 25 ug via INTRAVENOUS
  Administered 2024-06-18: 50 ug via INTRAVENOUS
  Administered 2024-06-18 (×3): 100 ug via INTRAVENOUS
  Administered 2024-06-18: 50 ug via INTRAVENOUS
  Administered 2024-06-18: 75 ug via INTRAVENOUS
  Administered 2024-06-19 – 2024-06-22 (×27): 100 ug via INTRAVENOUS

## 2024-06-17 MED ORDER — ETOMIDATE 2 MG/ML IV SOLN
INTRAVENOUS | Status: AC
  Administered 2024-06-17: 20 mg via INTRAVENOUS
  Filled 2024-06-17: qty 10

## 2024-06-17 MED ORDER — FAMOTIDINE 20 MG PO TABS
20.0000 mg | ORAL_TABLET | Freq: Every day | ORAL | Status: DC
  Administered 2024-06-18 – 2024-06-22 (×5): 20 mg
  Filled 2024-06-17 (×5): qty 1

## 2024-06-17 MED ORDER — FENTANYL CITRATE PF 50 MCG/ML IJ SOSY: 100.0000 ug | PREFILLED_SYRINGE | Freq: Once | INTRAMUSCULAR | Status: AC

## 2024-06-17 MED ORDER — ETOMIDATE 2 MG/ML IV SOLN: 20.0000 mg | Freq: Once | INTRAVENOUS | Status: AC

## 2024-06-17 MED ORDER — ACETAMINOPHEN 650 MG RE SUPP: 650.0000 mg | Freq: Four times a day (QID) | RECTAL | Status: DC | PRN

## 2024-06-17 MED ORDER — MIDAZOLAM HCL 2 MG/2ML IJ SOLN
2.0000 mg | Freq: Once | INTRAMUSCULAR | Status: AC
Start: 2024-06-17 — End: 2024-06-17
  Administered 2024-06-17: 2 mg via INTRAVENOUS

## 2024-06-17 MED ORDER — EPINEPHRINE 1 MG/10ML IV SOSY
PREFILLED_SYRINGE | INTRAVENOUS | Status: AC
  Administered 2024-06-17: 0.5 mg via INTRAVENOUS
  Filled 2024-06-17: qty 10

## 2024-06-17 MED ORDER — LIDOCAINE IN D5W 4-5 MG/ML-% IV SOLN
0.5000 mg/min | INTRAVENOUS | Status: DC
Start: 2024-06-17 — End: 2024-06-21
  Administered 2024-06-19: 1 mg/min via INTRAVENOUS
  Filled 2024-06-17 (×2): qty 500

## 2024-06-17 MED ORDER — MIDAZOLAM BOLUS VIA INFUSION: 0.0000 mg | INTRAVENOUS | Status: DC | PRN

## 2024-06-17 MED ORDER — DOCUSATE SODIUM 50 MG/5ML PO LIQD
100.0000 mg | Freq: Two times a day (BID) | ORAL | Status: DC
  Administered 2024-06-17 – 2024-06-22 (×10): 100 mg
  Filled 2024-06-17 (×10): qty 10

## 2024-06-17 MED ORDER — SODIUM CHLORIDE 0.9 % IV SOLN
0.0000 ug/min | INTRAVENOUS | Status: DC
  Administered 2024-06-17: 0.5 ug/min via INTRAVENOUS
  Administered 2024-06-18: 4 ug/min via INTRAVENOUS
  Filled 2024-06-17 (×4): qty 10

## 2024-06-17 MED ORDER — KETAMINE HCL 50 MG/5ML IJ SOSY
PREFILLED_SYRINGE | INTRAMUSCULAR | Status: AC
  Filled 2024-06-17: qty 10

## 2024-06-17 MED ORDER — CISATRACURIUM BOLUS VIA INFUSION
0.1000 mg/kg | Freq: Once | INTRAVENOUS | Status: DC
  Filled 2024-06-17: qty 15

## 2024-06-17 MED ORDER — ONDANSETRON HCL 4 MG/2ML IJ SOLN: 4.0000 mg | Freq: Four times a day (QID) | INTRAMUSCULAR | Status: DC | PRN

## 2024-06-17 MED ORDER — PANTOPRAZOLE SODIUM 40 MG IV SOLR
40.0000 mg | INTRAVENOUS | Status: DC
  Administered 2024-06-17 – 2024-06-21 (×5): 40 mg via INTRAVENOUS
  Filled 2024-06-17 (×5): qty 10

## 2024-06-17 MED ORDER — DEXMEDETOMIDINE HCL IN NACL 400 MCG/100ML IV SOLN
0.0000 ug/kg/h | INTRAVENOUS | Status: DC
  Administered 2024-06-17 (×4): 1 ug/kg/h via INTRAVENOUS
  Administered 2024-06-17: 0.4 ug/kg/h via INTRAVENOUS
  Administered 2024-06-18 (×2): 0.9 ug/kg/h via INTRAVENOUS
  Administered 2024-06-18: 0.8 ug/kg/h via INTRAVENOUS
  Administered 2024-06-18: 0.7 ug/kg/h via INTRAVENOUS
  Administered 2024-06-18: 1 ug/kg/h via INTRAVENOUS
  Administered 2024-06-18: 0.9 ug/kg/h via INTRAVENOUS
  Administered 2024-06-18: 0.8 ug/kg/h via INTRAVENOUS
  Administered 2024-06-19: 1 ug/kg/h via INTRAVENOUS
  Administered 2024-06-19: 1.1 ug/kg/h via INTRAVENOUS
  Administered 2024-06-19: 1 ug/kg/h via INTRAVENOUS
  Administered 2024-06-19: 1.1 ug/kg/h via INTRAVENOUS
  Administered 2024-06-19: 1 ug/kg/h via INTRAVENOUS
  Administered 2024-06-19 (×2): 1.1 ug/kg/h via INTRAVENOUS
  Administered 2024-06-19: 1 ug/kg/h via INTRAVENOUS
  Administered 2024-06-19 – 2024-06-21 (×11): 1.1 ug/kg/h via INTRAVENOUS
  Administered 2024-06-21: 1.2 ug/kg/h via INTRAVENOUS
  Administered 2024-06-21: 1.101 ug/kg/h via INTRAVENOUS
  Administered 2024-06-21: 1.1 ug/kg/h via INTRAVENOUS
  Administered 2024-06-21: 1.101 ug/kg/h via INTRAVENOUS
  Administered 2024-06-22 (×7): 1.2 ug/kg/h via INTRAVENOUS
  Filled 2024-06-17 (×4): qty 100
  Filled 2024-06-17: qty 200
  Filled 2024-06-17 (×14): qty 100
  Filled 2024-06-17: qty 200
  Filled 2024-06-17 (×7): qty 100
  Filled 2024-06-17: qty 200
  Filled 2024-06-17 (×8): qty 100
  Filled 2024-06-17: qty 200
  Filled 2024-06-17 (×5): qty 100

## 2024-06-17 MED ORDER — MIDAZOLAM HCL 2 MG/2ML IJ SOLN
INTRAMUSCULAR | Status: AC
  Administered 2024-06-17: 2 mg via INTRAVENOUS
  Filled 2024-06-17: qty 2

## 2024-06-17 MED ORDER — LINEZOLID 600 MG/300ML IV SOLN
600.0000 mg | Freq: Two times a day (BID) | INTRAVENOUS | Status: DC
  Filled 2024-06-17 (×2): qty 300

## 2024-06-17 MED ORDER — HYDROCORTISONE SOD SUC (PF) 100 MG IJ SOLR
100.0000 mg | Freq: Three times a day (TID) | INTRAMUSCULAR | Status: DC
  Administered 2024-06-17 – 2024-06-22 (×16): 100 mg via INTRAVENOUS
  Filled 2024-06-17 (×16): qty 2

## 2024-06-17 MED ORDER — OXYCODONE HCL 5 MG PO TABS: 5.0000 mg | ORAL_TABLET | ORAL | Status: DC | PRN

## 2024-06-17 MED ORDER — PROPOFOL 1000 MG/100ML IV EMUL
0.0000 ug/kg/min | INTRAVENOUS | Status: DC
  Administered 2024-06-17 (×2): 10 ug/kg/min via INTRAVENOUS
  Filled 2024-06-17: qty 100

## 2024-06-17 MED ORDER — MIDAZOLAM-SODIUM CHLORIDE 100-0.9 MG/100ML-% IV SOLN
0.0000 mg/h | INTRAVENOUS | Status: DC
  Administered 2024-06-18: 2 mg/h via INTRAVENOUS
  Administered 2024-06-20: 3 mg/h via INTRAVENOUS
  Administered 2024-06-22: 7 mg/h via INTRAVENOUS
  Filled 2024-06-17 (×4): qty 100

## 2024-06-17 MED ORDER — ORAL CARE MOUTH RINSE
15.0000 mL | OROMUCOSAL | Status: DC
  Administered 2024-06-17 – 2024-06-18 (×10): 15 mL via OROMUCOSAL

## 2024-06-17 MED ORDER — MIDAZOLAM-SODIUM CHLORIDE 100-0.9 MG/100ML-% IV SOLN: 0.5000 mg/h | INTRAVENOUS | Status: DC

## 2024-06-17 MED ORDER — FENTANYL CITRATE PF 50 MCG/ML IJ SOSY: 50.0000 ug | PREFILLED_SYRINGE | Freq: Once | INTRAMUSCULAR | Status: AC

## 2024-06-17 MED ORDER — MIDAZOLAM HCL 2 MG/2ML IJ SOLN
2.0000 mg | Freq: Once | INTRAMUSCULAR | Status: AC
Start: 2024-06-17 — End: 2024-06-17

## 2024-06-17 NOTE — Procedures (Addendum)
 Intubation Procedure Note  [name] [name]  [id number]  Sep 14, 1961  Date:06/17/24  Time:8:59 AM   Provider Performing:Rowan Blaker    Procedure: Intubation (31500)  Indication(s) Respiratory Failure  Consent Risks of the procedure as well as the alternatives and risks of each were explained to the patient and/or caregiver.  Consent for the procedure was obtained and is signed in the bedside chart   Anesthesia Etomidate  and Rocuronium    Time Out Verified patient identification, verified procedure, site/side was marked, verified correct patient position, special equipment/implants available, medications/allergies/relevant history reviewed, required imaging and test results available.   Sterile Technique Usual hand hygeine, masks, and gloves were used   Procedure Description Patient positioned in bed supine.  Sedation given as noted above.  Patient was intubated with endotracheal tube using Glidescope.  View was Grade 1 full glottis .  Number of attempts was 1.  Colorimetric CO2 detector was consistent with tracheal placement.   Complications/Tolerance None; patient tolerated the procedure well. Chest X-ray is ordered to verify placement.   EBL none   Specimen(s) None

## 2024-06-17 NOTE — Plan of Care (Signed)
  Interdisciplinary Goals of Care Family Meeting   Date carried out:: 06/17/2024  Location of the meeting: Bedside  Member's involved: Physician, Bedside Registered Nurse, Family Member or next of kin, and Other: Physician Assistant   Durable Power of Attorney or acting medical decision maker: Pt's wife   [place]   Discussion: We discussed goals of care for [employer] .  Radames has rapidly decompensated this morning and BP continues to drop despite max, Norepi, Epi, Vaso, also on milrinone, nitric, amiodarone  and lidocaine , CRRT and mechanical ventilation.  Myself and Dr. Cherrie discussed that we are supporting patient in every way possible and do not recommend chest compressions if his heart were to stop.  Family agreed that they would not want pt to suffer further, they would like to continue maximal medical therapy and would like to pause CRRT, but would want DNR if he passes away.  Will leave ICD activated at this time  Code status: Full DNR  Disposition: Continue current acute care   Time spent for the meeting: 15 minutes  Leita SAUNDERS Marqueta Pulley 06/17/2024, 12:14 PM

## 2024-06-17 NOTE — Progress Notes (Signed)
 No sputum at this time for sputum sample.

## 2024-06-17 NOTE — Progress Notes (Addendum)
 Pharmacy Antibiotic Note  [name] [name] is a 63 y.o. male admitted on 06/13/2024 with sepsis.  Pharmacy has been consulted for vancomycin  dosing.  Of note, the patient was on CRRT from 9/29 until it was withdrawn per family's wishes on 9/30. Based on renal function of 3.09 (while on CRRT), wt of 146.8 kg and Vd 0.5, next dose after load would be due in 48 hours (morning of 10/2). Patient also has not had any UOP on 9/30.  Expect Scr to trend up while off CRRT, which may impact dosing.  Plan: Vancomycin  2500 mg IV x1 Will consider checking level prior to ordering next dose due to unstable renal function Monitor cultures, clinical status, renal function, vancomycin  level Narrow abx as able and f/u duration    Height: 6' 4 (193 cm) Weight: (!) 146.8 kg (323 lb 10.2 oz) IBW/kg (Calculated) : 86.8  Temp (24hrs), Avg:100.1 F (37.8 C), Min:98.2 F (36.8 C), Max:101.3 F (38.5 C)  Recent Labs  Lab 06/13/24 1435 06/13/24 1649 06/14/24 0503 06/14/24 1040 06/14/24 1405 06/14/24 1418 06/15/24 0429 06/15/24 1627 06/16/24 0420 06/16/24 1626 06/17/24 0259 06/17/24 0839 06/17/24 1102 06/17/24 1105  WBC 10.8*  --  9.7  --   --   --  10.5  --  9.8  --  9.5  --   --   --   CREATININE 4.47*  --  3.68*  --    < >  --  3.21*  3.19* 3.89* 4.34* 3.45* 2.57*  --  3.09*  --   LATICACIDVEN  --    < >  --  0.9  --  <0.3*  --   --   --   --   --  0.5 0.5 0.7   < > = values in this interval not displayed.    Estimated Creatinine Clearance: 39.3 mL/min (A) (by C-G formula based on SCr of 3.09 mg/dL (H)).    Allergies  Allergen Reactions   Nsaids Other (See Comments)    Contraindication due to CKD III   Sglt2 Inhibitors Other (See Comments)    Recurrent UTI    Antimicrobials this admission: none  Microbiology results: 9/26 BCx: pending 9/27 MRSA PCR: not detected  Thank you for allowing pharmacy to be a part of this patient's care.  WENDI Amon Rocher, PharmD PGY-1 Pharmacy  Resident [facility] 06/17/2024 2:00 PM

## 2024-06-17 NOTE — TOC CM/SW Note (Signed)
.  Transition of Care [facility]) - Inpatient Brief Assessment   Patient Details  Name: [name] [name] MRN: [id number] Date of Birth: Jan 21, 1961  Transition of Care [facility]) CM/SW Contact:    Justina Delcia Czar, RN Phone Number: [phone] 06/17/2024, 3:46 PM   Clinical Narrative:  Patient from home with SO. Presented with Shortness of breath and nausea with recent ER visit. GOC discussion continue pending patient progress.   Chart reviewed for discharge readiness, patient not medically stable for d/c. Inpatient CM/CSW will continue to monitor pt's advancement through interdisciplinary progression rounds. If new pt transition needs arise, MD please place a TOC consult.    Transition of Care Asessment: Insurance and Status: Insurance coverage has been reviewed Patient has primary care physician: Yes Home environment has been reviewed: home with wife Prior level of function:: independent Prior/Current Home Services: No current home services Social Drivers of Health Review: SDOH reviewed no interventions necessary Readmission risk has been reviewed: Yes Transition of care needs: transition of care needs identified, TOC will continue to follow

## 2024-06-17 NOTE — Procedures (Signed)
 Arterial Catheter Insertion Procedure Note  [name] [name]  [id number]  1960/10/16  Date:06/17/24  Time:9:42 AM    Provider Performing: Rexene LOISE Blush    Procedure: Insertion of Arterial Line (63379) with US  guidance (23062)   Indication(s) Blood pressure monitoring and/or need for frequent ABGs  Consent Unable to obtain consent due to emergent nature of procedure.  Anesthesia None   Time Out Verified patient identification, verified procedure, site/side was marked, verified correct patient position, special equipment/implants available, medications/allergies/relevant history reviewed, required imaging and test results available.   Sterile Technique Maximal sterile technique including full sterile barrier drape, hand hygiene, sterile gown, sterile gloves, mask, hair covering, sterile ultrasound probe cover (if used).   Procedure Description Area of catheter insertion was cleaned with chlorhexidine  and draped in sterile fashion. With real-time ultrasound guidance an arterial catheter was placed into the right radial artery.  Appropriate arterial tracings confirmed on monitor.     Complications/Tolerance None; patient tolerated the procedure well.   EBL Minimal   Specimen(s) None   Rexene LOISE Blush, [place] [facility] 06/17/24 9:42 AM  Please see [email or web address] for pager details.  From 7A-7P if no response, please call [phone] After hours, please call ELink [phone]

## 2024-06-17 NOTE — Progress Notes (Signed)
 06/17/2024 Rapid decompensation this am. Asking family to come in as we are running out of options.  Rolan Sharps MD PCCM

## 2024-06-17 NOTE — Progress Notes (Addendum)
 NAMEKodah [name], MRN:  [id number], DOB:  1960-12-16, LOS: 4 ADMISSION DATE:  06/13/2024, CONSULTATION DATE:  06/14/2024 REFERRING MD:  TRH, CHIEF COMPLAINT:  Cardiogenic shock    History of Present Illness:  [name] [name] is a 63 y.o. male with a PMH significant for HFrEF, severely dilated LV, nonischemic cardiomyopathy, recurrent/sustained V. tach requiring episodic ICD shocks, presence of biventricular ICD, paroxysmal atrial fibrillation, CKD stage IIIb, OSA on BiPAP, HTN, anxiety, and depression who presented to the ED at [facility] 9/26 for complaints of shortness of breath and nausea. Workup on admission revealed decompensated CHF with concerns to progression of cardiogenic shock prompting PCCM and heart failure consult.   Pertinent  Medical History  HFrEF, severely dilated LV, nonischemic cardiomyopathy, recurrent/sustained V. tach requiring episodic ICD shocks, presence of biventricular ICD, paroxysmal atrial fibrillation, CKD stage IIIb, OSA on BiPAP, HTN, anxiety, and depression   Significant Hospital Events: Including procedures, antibiotic start and stop dates in addition to other pertinent events   9/26 presented to [name] [name], ED for complaints of shortness of breath, weight gain, and nausea  9/27 required initiation of vasopressor support for persistent hypotension in the setting of evolving cardiogenic shock. PCCM and HF consulted  9/30 worsening tachypnea and dyspnea requiring intubation, increased norepi support   Interim History / Subjective:  Pt reported worsening dyspnea this AM with RR in the 40's despite >3L volume removal with CRRT Febrile to 101.52F with low SVR ~300 He was intubated with peri-procedural hypotension Remains on Vaso, norepi and milrinone   Objective    Blood pressure 100/61, pulse 84, temperature (!) 101.1 F (38.4 C), resp. rate (!) 25, height 6' 4 (1.93 m), weight (!) 146.8 kg, SpO2 96%. PAP: (29-51)/(21-39) 33/27 CVP:  [9 mmHg-47 mmHg] 12  mmHg CO:  [11.2 L/min-11.3 L/min] 11.3 L/min CI:  [4.08 L/min/m2-4.1 L/min/m2] 4.1 L/min/m2  FiO2 (%):  [70 %] 70 %   Intake/Output Summary (Last 24 hours) at 06/17/2024 9096 Last data filed at 06/17/2024 0800 Gross per 24 hour  Intake 2927.63 ml  Output 6827.4 ml  Net -3899.77 ml   Filed Weights   06/15/24 0600 06/16/24 0500 06/17/24 0500  Weight: (!) 148.6 kg (!) 146.8 kg (!) 146.8 kg   General:  critically ill-appearing M in respiratory distress HEENT: MM pink/moist, +JVD Neuro: alert and oriented  CV: s1s2 tachycardic, no m/r/g PULM:  diminished throughout on bipap without wheezing or rhonchi, tachypneic with accessory muscle use  GI: soft, distended  Extremities: warm/dry, 2+ edema   Labs: Hgb 8.3 K 4.5 Na 130 Lactic pending   Resolved problem list   Assessment and Plan  Acute on chronic HFrEF due to nonischemic cardiomyopathy now with cardiogenic shock and likely septic shock Hypervolemic hyponatremia Acute respiratory failure with hypoxia due to pulmonary edema +/- pneumonia  Paroxysmal A-fib Prior history of recurrent VT's status post biventricular AICD AKI on CKD stage IIIb now requiring CRRT Hyperkalemia, resolved Anemia of chronic disease Morbid obesity/obstructive sleep apnea Alcohol abuse Obstipation with abdominal distention  - remains on CRRT, continue to pull volume as able -respiratory status worsened today requiring intubation  -blood cultures, tracheal aspirate culture,  lactic acid and start linezolid and unasyn -Maintain full vent support with SAT/SBT as tolerated -titrate Vent setting to maintain SpO2 greater than or equal to 90%. -HOB elevated 30 degrees. -Plateau pressures less than 30 cm H20.  -Follow chest x-ray, ABG prn.   -Bronchial hygiene and RT/bronchodilator protocol. - Levo/vaso for MAP 65 -  remains on milrinone 0.25mcg/kg - Heparin  gtt -senna for constipation  CRITICAL CARE Performed by: [name] [name]   Total critical  care time: 40 minutes  Critical care time was exclusive of separately billable procedures and treating other patients.  Critical care was necessary to treat or prevent imminent or life-threatening deterioration.  Critical care was time spent personally by me on the following activities: development of treatment plan with patient and/or surrogate as well as nursing, discussions with consultants, evaluation of patient's response to treatment, examination of patient, obtaining history from patient or surrogate, ordering and performing treatments and interventions, ordering and review of laboratory studies, ordering and review of radiographic studies, pulse oximetry and re-evaluation of patient's condition.     [name] SAUNDERS Zema Lizardo, PA-C  Pulmonary & Critical care See Amion for pager If no response to pager , please call [phone] until 7pm After 7:00 pm call Elink  [phone]

## 2024-06-17 NOTE — Progress Notes (Signed)
 No ICM remote transmission received for 06/16/2024 due to current hospitalization and next ICM transmission scheduled for 07/07/2024.

## 2024-06-17 NOTE — Progress Notes (Addendum)
 PHARMACY - ANTICOAGULATION CONSULT NOTE  Pharmacy Consult for heparin  Indication: atrial fibrillation  Allergies  Allergen Reactions   Nsaids Other (See Comments)    Contraindication due to CKD III   Sglt2 Inhibitors Other (See Comments)    Recurrent UTI    Patient Measurements: Height: 6' 4 (193 cm) Weight: (!) 146.8 kg (323 lb 10.2 oz) IBW/kg (Calculated) : 86.8 HEPARIN  DW (KG): 122.2  Vital Signs: Temp: 101.1 F (38.4 C) (09/30 0700) Temp Source: Core (09/30 0400) BP: 100/61 (09/30 0700) Pulse Rate: 84 (09/30 0700)  Labs: Recent Labs    06/15/24 0211 06/15/24 0429 06/15/24 1214 06/16/24 0420 06/16/24 1213 06/16/24 1626 06/16/24 2108 06/17/24 0259  HGB  --  7.7*  --  7.0*  --   --  8.2* 8.3*  HCT  --  23.6*  --  21.1*  --   --  24.7* 25.5*  PLT  --  321  --  246  --   --   --  205  APTT 31  --   --   --   --   --   --   --   HEPARINUNFRC 0.11*  --    < > 0.35 0.40  --   --  0.26*  CREATININE  --  3.21*  3.19*   < > 4.34*  --  3.45*  --  2.57*   < > = values in this interval not displayed.    Estimated Creatinine Clearance: 47.3 mL/min (A) (by C-G formula based on SCr of 2.57 mg/dL (H)).   Medical History: Past Medical History:  Diagnosis Date   AICD (automatic cardioverter/defibrillator) present    Arthritis    Atrial fibrillation (HCC)    Chronic HFrEF (heart failure with reduced ejection fraction) (HCC)    Chronic systolic heart failure (HCC)    Nonischemic CM: echo 4/12 with EF 25-30%, grade 2 diast dysfxn, mild dilated aortic root 43 mm, trivial MR, mod LAE   COPD (chronic obstructive pulmonary disease) (HCC)    Dyspnea    with exertion    H/O ETOH abuse    HTN (hypertension)    Hypersomnia    Mild CAD    Nonischemic cardiomyopathy (HCC)    Obesity, morbid (HCC)    OSA on CPAP    Tobacco use disorder    Ventricular tachycardia (HCC)    Assessment: 21 yoM admitted with cardiogenic shock. Pt on rivaroxaban  PTA for hx AF. Pt in renal  failure and may potentially need MCS so will hold DOAC and use IV heparin . LD rivaroxaban  9/25.  Heparin  level is subtherapeutic at 0.26 on heparin  2450 units/hr. CBC is stable. No pause or issues with the infusion overnight per RN.  Goal of Therapy:  Heparin  level 0.3-0.7 units/ml Monitor platelets by anticoagulation protocol: Yes   Plan:  Increase heparin  infusion to 2700 units/hr Recheck heparin  level in 6 hours Monitor daily heparin  level, CBC, and for s/sx of bleeding   9/30 PM Addendum: Repeat heparin  level came back therapeutic at 0.37 on heparin  2700 units/hr + the 2400 units in the CRRT port. CBC is stable. Increase slightly to 2800 units/hr and recheck heparin  level in 8 hours.  Thank you for allowing pharmacy to participate in this patient's care,  B. Amon Rocher, PharmD PGY-1 Pharmacy Resident [facility] 06/17/2024 7:27 AM    [facility] pharmacy phone numbers are listed on [email or web address]

## 2024-06-17 NOTE — Progress Notes (Addendum)
 Patient ID: [name] [name], male   DOB: Sep 04, 1961, 63 y.o.   MRN: [id number]     Advanced Heart Failure Rounding Note  Cardiologist: Ezra Shuck, MD  Chief Complaint: CHF Subjective:    Started on CRRT yesterday. 3.7L pulled overnight, down on NE, currently at 7.  Temperature has climbed overnight, Tmax is 101.1 and climbing. WBC 9.5, Hgb 8.3  Hemodynamics: CVP 19 PA 55/38 CO/CI 11.4/4.14 SVR 330  Requested at bedside by CCM, patient had started to feel that he was drowning with respirations in the 40s and accessory muscle breathing. Dicussed intubation with patient and he is agreeable. Attempted to call wife and sister, however were not able to reach them by phone. Will attempt to reach them again a little later.   Objective:    Weight Range: (!) 146.8 kg Body mass index is 39.39 kg/m.   Vital Signs:   Temp:  [97.7 F (36.5 C)-101.1 F (38.4 C)] 101.1 F (38.4 C) (09/30 0700) Pulse Rate:  [62-130] 84 (09/30 0700) Resp:  [12-35] 25 (09/30 0700) BP: (68-142)/(47-127) 100/61 (09/30 0700) SpO2:  [81 %-96 %] 96 % (09/30 0700) FiO2 (%):  [70 %] 70 % (09/30 0600) Weight:  [146.8 kg] 146.8 kg (09/30 0500) Last BM Date :  (PTA)  Weight change: Filed Weights   06/15/24 0600 06/16/24 0500 06/17/24 0500  Weight: (!) 148.6 kg (!) 146.8 kg (!) 146.8 kg   Intake/Output:  Intake/Output Summary (Last 24 hours) at 06/17/2024 0845 Last data filed at 06/17/2024 0800 Gross per 24 hour  Intake 3184.93 ml  Output 6827.4 ml  Net -3642.47 ml    Physical Exam    General: Appears in distress Cardiac: JVP to ear. S1 and S2 present.  Resp: Lung sounds coarse, rhonchi throughout Abdomen: Firm, non-tender, distended.  Extremities: Warm and dry.  3+ BLE edema.   Telemetry   Intermittent AV and V pacing with occasional PVCs  Labs    CBC Recent Labs    06/16/24 0420 06/16/24 2108 06/17/24 0259 06/17/24 0838  WBC 9.8  --  9.5  --   HGB 7.0*   < > 8.3* 8.8*  HCT 21.1*   < >  25.5* 26.0*  MCV 97.2  --  96.6  --   PLT 246  --  205  --    < > = values in this interval not displayed.   Basic Metabolic Panel Recent Labs    90/71/74 0429 06/15/24 1627 06/16/24 1626 06/17/24 0259 06/17/24 0838  NA 133*  131*   < > 128* 130* 130*  K 4.9  4.8   < > 4.4 4.5 4.7  CL 98  97*   < > 93* 94*  --   CO2 22  23   < > 22 23  --   GLUCOSE 167*  166*   < > 167* 179*  --   BUN 85*  85*   < > 77* 58*  --   CREATININE 3.21*  3.19*   < > 3.45* 2.57*  --   CALCIUM  8.3*  8.2*   < > 8.4* 8.6*  --   MG 2.3  --   --  2.2  --   PHOS 5.7*   < > 4.9* 4.2  --    < > = values in this interval not displayed.   Liver Function Tests Recent Labs    06/16/24 1626 06/17/24 0259  ALBUMIN  3.0* 3.1*   BNP (last 3 results) Recent Labs  03/26/24 1413 04/28/24 1023 06/04/24 0204  BNP 326.3* 611.9* 219.1*   ProBNP (last 3 results) Recent Labs    06/13/24 1435  PROBNP 2,884.0*   Medications:    Scheduled Medications:  EPINEPHrine        etomidate        fentaNYL        ketamine  HCl       midazolam        phenylephrine        rocuronium        succinylcholine       atorvastatin   80 mg Oral QPM   Chlorhexidine  Gluconate Cloth  6 each Topical Daily   folic acid   1 mg Oral Daily   pantoprazole   40 mg Oral Daily   polyethylene glycol  17 g Oral BID   senna-docusate  2 tablet Oral QHS   thiamine   100 mg Oral Daily    Infusions:  amiodarone  30 mg/hr (06/17/24 0700)   heparin  2,700 Units/hr (06/17/24 0700)   linezolid (ZYVOX) IV     milrinone 0.25 mcg/kg/min (06/17/24 0750)   norepinephrine (LEVOPHED) Adult infusion 8 mcg/min (06/17/24 0700)   prismasol BGK 4/2.5 400 mL/hr at 06/17/24 0147   prismasol BGK 4/2.5 400 mL/hr at 06/17/24 0147   prismasol BGK 4/2.5 1,500 mL/hr at 06/17/24 0608   vasopressin 0.03 Units/min (06/17/24 0700)    PRN Medications: EPINEPHrine , etomidate , fentaNYL , ketamine  HCl, midazolam , phenylephrine , rocuronium , succinylcholine,  acetaminophen  **OR** acetaminophen , albuterol , butalbital -acetaminophen -caffeine , heparin , ondansetron  **OR** ondansetron  (ZOFRAN ) IV, mouth rinse, oxyCODONE , sodium chloride   Assessment/Plan   1. Acute on chronic systolic CHF: NICM, Echo 12/2016 EF 15%. Minimal CAD on cath in July 2016 and again in 9/24. [employer] CRT-D device. ETOH vs HTN vs myocarditis as cause of cardiomyopathy. Echo 12/22 and again in 9/24 showed that EF remains 20-25%.  PYP scan not suggestive of TTR cardiac amyloidosis.  RHC 5/25 showed R>L heart failure with preserved cardiac output by Fick and thermo but low PAPi.  He historically has very frequent NSVT and VT.  Last echo in 8/25 showed EF 20-25%, normal RV function per report, mild MR.  He was admitted with low BP (baseline SBP 90s) and marked volume overload.  Started on peripheral norepinephrine.  RHC showed markedly elevated biventricular filling pressures but cardiac index normal by Fick and thermodilution and lactate normal. PAPi was low.   Milrinone begun for RV support. Persistent hypotension/vasoplegia.  - CO/CI remains stable, however CVP still 19-20 and PAP up from yesterday - Continue NE and VP for BP support, low SVR. Wean as able - Continue milrinone to 0.25 for RV support.  - Continue CRRT UF 200-250, needs significant fluid removal  2. Paroxysmal atrial fibrillation: Maintaining SR (a-paced).  - Continue amiodarone .  - Heparin  gtt for now.   3. ETOH abuse: Suspect this contributes to cardiomyopathy.  He has cut back but still drinks moderately, asked him to cut back further.    4. VT: Had VT ablation at [facility] in 4/25 but still with frequent NSVT and VT.  Had VT with shock earlier this month. He has been referred to EP at [facility] for additional evaluation. No VT this admission. - amio gtt while on milrinone.  - Hold sotalol  with AKI.  - Hold Toprol  XL with marked volume overload and RV failure   5. Anemia: Hgb 7.0 today, mildly lower (8.8 at admission),  suspect at least in part dilutional.  Transferrin saturation 16%. No overt bleeding. S/p IV Fe - 1u RBCs yesterday. Hgb 8.3 today -  continue protonix   6. OSA: On Bipap at home   7. AKI on CKD stage 3: Creatinine up to 4.47, baseline recently has been around 2.2-2.3.  Suspect cardiorenal with markedly elevated right-sided filling pressures. - now on CRRT. Pull 200-250 UF as able with hemodynamics and vasoplegia  8. Acute Hypoxic Respiratory Failure: due to pulmonary edema. Increasing O2 requirement yesterday.  - respiratory compromise this morning requiring intubation - vent mgmt per CCM - continue diuresis as above  CRITICAL CARE Performed by: [place] Lee  Total critical care time: 15 minutes  -Critical care time was exclusive of separately billable procedures and treating other patients. -Critical care was necessary to treat or prevent imminent or life-threatening deterioration. -Critical care was time spent personally by me on the following activities: development of treatment plan with patient and/or surrogate as well as nursing, discussions with consultants, evaluation of patient's response to treatment, examination of patient, obtaining history from patient or surrogate, ordering and performing treatments and interventions, ordering and review of laboratory studies, ordering and review of radiographic studies, pulse oximetry and re-evaluation of patient's condition.  Length of Stay: 4  [place] Lee, NP  06/17/2024, 8:45 AM  Advanced Heart Failure Team Pager [phone] (M-F; 7a - 5p)  Please contact [facility] for night-coverage after hours (5p -7a ) and weekends on [email or web address]    Agree with above.   He has had progressive deterioration this am with MSOF and probable sepsis on top of profound cardiogenic shock.   Now intubated and having frequent runs of VT  Family at bedside  General:  Critically ill on vent HEENT: + ETT Neck: RIJ HD cath Cor: tachy irregular Lungs:  coarse Abdomen: obese distended. Extremities: no cyanosis, clubbing, rash, 3+ edema Neuro: sedated  He is terminally ill with MSOF> Now on high-dose pressors, IV abx , CVVHD and anti-arrhythmics. Chance of meaningful survival close to zero. I spoke with family personally. Will make no CPR. Otherwise will continue current RX for now. They have requested that CVVHD be stopped for now. We will honor that request.   CRITICAL CARE Performed by: Cherrie Sieving  Total critical care time: 55 minutes  Critical care time was exclusive of separately billable procedures and treating other patients.  Critical care was necessary to treat or prevent imminent or life-threatening deterioration.  Critical care was time spent personally by me (independent of midlevel providers or residents) on the following activities: development of treatment plan with patient and/or surrogate as well as nursing, discussions with consultants, evaluation of patient's response to treatment, examination of patient, obtaining history from patient or surrogate, ordering and performing treatments and interventions, ordering and review of laboratory studies, ordering and review of radiographic studies, pulse oximetry and re-evaluation of patient's condition. Sieving Cherrie, MD  12:17 PM

## 2024-06-17 NOTE — Progress Notes (Signed)
 Nephrology Follow-Up Consult note   Assessment/Recommendations: [name] [name] is a/an 63 y.o. male with a past medical history significant for CKD 3B, HFrEF, severely dilated LV, NICM, recurrent/sustained V. tach requiring episodic ICD shocks,BiV ICD, pAF, OSA on BiPAP, HTN, anxiety depression who presents to [facility] with decompensated heart failure c/f cardiogenic shock.  Anuric AKI (unchanged): Likely secondary to cardiogenic shock in the setting of severe HFrEF, severely dilated LV, NICM -He failed conservative measures with high-dose IV diuretics.  Worsening clinical status. -Chart reviewed: (medications acceptable, does not appear to have been exposed to nephrotoxins with imaging or had episodes of significant hypotension).   -Continue to monitor daily Cr, Dose meds for GFR -Monitor Daily I/Os, Daily weight  -Maintain MAP>65 for optimal renal perfusion.  -Avoid nephrotoxic medications including NSAIDs -Use synthetic opioids (Fentanyl /Dilaudid ) if needed -CRRT: 9/29-now   Volume Status: Appears volume overloaded on exam. Based on our examination and review of available imaging, our recommendation is CRRT as above.  Cardiogenic shock: In the setting of acute on chronic systolic heart failure.  Followed by HF.  Failed conservative measures.  Cont CRRT as above   Hyponatremia: Worsening likely secondary to heart failure exacerbation.  Should improve with CRRT.  Monitor for now. Hyperphosphatemia: Likely secondary to worsening renal status.  Will likely improve with CRRT.  Monitor for now.   # Normocytic anemia: Worsening.  Likely acute on chronic. -Transfuse for Hgb<7 g/dL -Defer to primary -pRBC x1, 9/29 -No role for ESA in setting of AKI  Acute hypoxemic respiratory failure: now intubated, per primary   Recommendations conveyed to primary service.    [name] 06/17/2024 7:00 AM  ___________________________________________________________  CC:  volume overload  Interval History/Subjective:  Sodium improved 126 -> 130; CO2 stable.  Phos 4.2.  Received PRBC x 1.  Febrile 101.81F. Intubated this morning.  No issues with CRRT.  Previously pulling 200-250 mL an hour, currently even.  On pressors.  Medications:  Current Facility-Administered Medications  Medication Dose Route Frequency Provider Last Rate Last Admin   acetaminophen  (TYLENOL ) tablet 650 mg  650 mg Oral Q6H PRN Zella, Mir M, MD   650 mg at 06/17/24 9747   Or   acetaminophen  (TYLENOL ) suppository 650 mg  650 mg Rectal Q6H PRN Zella, Mir M, MD       albuterol  (PROVENTIL ) (2.5 MG/3ML) 0.083% nebulizer solution 2.5 mg  2.5 mg Nebulization Q2H PRN Zella, Mir M, MD       amiodarone  (NEXTERONE  PREMIX) 360-4.14 MG/200ML-% (1.8 mg/mL) IV infusion  30 mg/hr Intravenous Continuous [name], Dalton S, MD 16.67 mL/hr at 06/17/24 0600 30 mg/hr at 06/17/24 0600   atorvastatin  (LIPITOR ) tablet 80 mg  80 mg Oral QPM Zella, Mir M, MD   80 mg at 06/16/24 1740   butalbital -acetaminophen -caffeine  (FIORICET) 50-325-40 MG per tablet 1 tablet  1 tablet Oral Q6H PRN Zella Katha HERO, MD   1 tablet at 06/14/24 0327   Chlorhexidine  Gluconate Cloth 2 % PADS 6 each  6 each Topical Daily Chand, Sudham, MD   6 each at 06/16/24 1000   folic acid  (FOLVITE ) tablet 1 mg  1 mg Oral Daily Chand, Sudham, MD   1 mg at 06/16/24 9076   heparin  ADULT infusion 100 units/mL (25000 units/250mL)  2,700 Units/hr Intravenous Continuous Baruch Heather HERO, RPH 27 mL/hr at 06/17/24 9355 2,700 Units/hr at 06/17/24 0644   heparin  injection 1,000-6,000 Units  1,000-6,000 Units CRRT PRN Windle [name] HERO, DO  milrinone (PRIMACOR) 20 MG/100 ML (0.2 mg/mL) infusion  0.25 mcg/kg/min Intravenous Continuous Rolan Ezra RAMAN, MD 11.57 mL/hr at 06/17/24 0600 0.25 mcg/kg/min at 06/17/24 0600   norepinephrine (LEVOPHED) 16 mg in (0.064 mg/mL) premix infusion  0-40 mcg/min Intravenous Titrated Lee, [place], NP 8.44  mL/hr at 06/17/24 0600 9 mcg/min at 06/17/24 0600   ondansetron  (ZOFRAN ) tablet 4 mg  4 mg Oral Q6H PRN Zella Katha HERO, MD   4 mg at 06/15/24 9065   Or   ondansetron  (ZOFRAN ) injection 4 mg  4 mg Intravenous Q6H PRN Zella Katha HERO, MD       Oral care mouth rinse  15 mL Mouth Rinse PRN Harold Scholz, MD       oxyCODONE  (Oxy IR/ROXICODONE ) immediate release tablet 5 mg  5 mg Oral Q4H PRN Zella, Mir M, MD   5 mg at 06/17/24 0344   pantoprazole  (PROTONIX ) EC tablet 40 mg  40 mg Oral Daily [name], Dalton S, MD   40 mg at 06/16/24 9076   polyethylene glycol (MIRALAX  / GLYCOLAX ) packet 17 g  17 g Oral BID Rolan Ezra RAMAN, MD   17 g at 06/16/24 2108   prismasol BGK 4/2.5 infusion   CRRT Continuous Windle [name] HERO, DO 400 mL/hr at 06/17/24 0147 New Bag at 06/17/24 0147   prismasol BGK 4/2.5 infusion   CRRT Continuous Windle [name] HERO, DO 400 mL/hr at 06/17/24 0147 New Bag at 06/17/24 0147   prismasol BGK 4/2.5 infusion   CRRT Continuous Windle [name] HERO, DO 1,500 mL/hr at 06/17/24 9391 New Bag at 06/17/24 0608   sodium chloride  0.9 % primer fluid for CRRT   CRRT PRN Windle [name] M, DO       thiamine  (VITAMIN B1) tablet 100 mg  100 mg Oral Daily Harold Scholz, MD   100 mg at 06/16/24 9076   vasopressin (PITRESSIN) 20 Units in 100 mL (0.2 unit/mL) infusion-*FOR SHOCK*  0-0.04 Units/min Intravenous Continuous Lee, [place], NP 9 mL/hr at 06/17/24 0600 0.03 Units/min at 06/17/24 0600      Review of Systems: 10 systems reviewed and negative except per interval history/subjective  Physical Exam: Vitals:   06/17/24 0615 06/17/24 0630  BP: (!) 115/92 (!) 130/98  Pulse: (!) 130 (!) 110  Resp: 18 (!) 21  Temp: (!) 100.9 F (38.3 C) (!) 100.9 F (38.3 C)  SpO2: 92% (!) 89%   Total I/O In: 1312.5 [P.O.:510; I.V.:802.5] Out: 3889.2 [Urine:400]  Intake/Output Summary (Last 24 hours) at 06/17/2024 0700 Last data filed at 06/17/2024 0600 Gross per 24 hour  Intake 2994.62 ml  Output  6525.5 ml  Net -3530.88 ml   Constitutional: Intubated and sedated CV: normal rate, no edema Respiratory: Bilateral Breath sounds Gastrointestinal: soft, non-tender, no palpable masses or hernias Skin: no visible lesions or rashes Psych: Sedated   Test Results I personally reviewed new and old clinical labs and radiology tests Lab Results  Component Value Date   NA 130 (L) 06/17/2024   K 4.5 06/17/2024   CL 94 (L) 06/17/2024   CO2 23 06/17/2024   BUN 58 (H) 06/17/2024   CREATININE 2.57 (H) 06/17/2024   GFR 96.53 04/19/2015   CALCIUM  8.6 (L) 06/17/2024   ALBUMIN  3.1 (L) 06/17/2024   PHOS 4.2 06/17/2024    CBC Recent Labs  Lab 06/15/24 0429 06/16/24 0420 06/16/24 2108 06/17/24 0259  WBC 10.5 9.8  --  9.5  HGB 7.7* 7.0* 8.2* 8.3*  HCT 23.6* 21.1* 24.7* 25.5*  MCV  98.3 97.2  --  96.6  PLT 321 246  --  205

## 2024-06-17 NOTE — Progress Notes (Signed)
 Initial Nutrition Assessment  Dietitian Consult received for Assessment.   Hemodynamic instability with rapid decline this AM. Intubated, max pressor/inotrope support.  GOC discussions ongoing, pt is a DNR now. Noted family wanting to continue maximal medical therapy but full DNR if heart stops.   RD to defer full initial assessment at this time. Pt is not appropriate for nutrition interventions at this time.   Betsey Finger MS, RDN, LDN, CNSC Registered Dietitian 3 Clinical Nutrition RD Inpatient Contact Info in Amion

## 2024-06-17 NOTE — Progress Notes (Addendum)
 Patient started on Nitric 20 ppm. CCM requested the peep to be increased to 10, patient started auto- peeping, so peep was reset to 8. CCM notified.

## 2024-06-18 DIAGNOSIS — I5082 Biventricular heart failure: Secondary | ICD-10-CM

## 2024-06-18 DIAGNOSIS — K5900 Constipation, unspecified: Secondary | ICD-10-CM

## 2024-06-18 DIAGNOSIS — J9620 Acute and chronic respiratory failure, unspecified whether with hypoxia or hypercapnia: Secondary | ICD-10-CM

## 2024-06-18 DIAGNOSIS — R570 Cardiogenic shock: Secondary | ICD-10-CM

## 2024-06-18 DIAGNOSIS — I480 Paroxysmal atrial fibrillation: Secondary | ICD-10-CM | POA: Diagnosis not present

## 2024-06-18 LAB — RESPIRATORY PANEL BY PCR

## 2024-06-18 LAB — BASIC METABOLIC PANEL WITH GFR
Anion gap: 10 (ref 5–15)
BUN: 52 mg/dL — ABNORMAL HIGH (ref 8–23)
CO2: 23 mmol/L (ref 22–32)
Calcium: 8.4 mg/dL — ABNORMAL LOW (ref 8.9–10.3)
Chloride: 96 mmol/L — ABNORMAL LOW (ref 98–111)
Creatinine, Ser: 2.15 mg/dL — ABNORMAL HIGH (ref 0.61–1.24)
GFR, Estimated: 34 mL/min — ABNORMAL LOW (ref >60)
Glucose, Bld: 217 mg/dL — ABNORMAL HIGH (ref 70–99)
Potassium: 4.2 mmol/L (ref 3.5–5.1)
Sodium: 129 mmol/L — ABNORMAL LOW (ref 135–145)

## 2024-06-18 LAB — CBC
HCT: 22.6 % — ABNORMAL LOW (ref 39.0–52.0)
HCT: 24.1 % — ABNORMAL LOW (ref 39.0–52.0)
Hemoglobin: 7.3 g/dL — ABNORMAL LOW (ref 13.0–17.0)
Hemoglobin: 7.9 g/dL — ABNORMAL LOW (ref 13.0–17.0)
MCH: 31.6 pg (ref 26.0–34.0)
MCH: 31.1 pg (ref 26.0–34.0)
MCHC: 32.3 g/dL (ref 30.0–36.0)
MCHC: 32.8 g/dL (ref 30.0–36.0)
MCV: 97.8 fL (ref 80.0–100.0)
MCV: 94.9 fL (ref 80.0–100.0)
Platelets: 181 K/uL (ref 150–400)
Platelets: 146 K/uL — ABNORMAL LOW (ref 150–400)
RBC: 2.31 MIL/uL — ABNORMAL LOW (ref 4.22–5.81)
RBC: 2.54 MIL/uL — ABNORMAL LOW (ref 4.22–5.81)
RDW: 15.6 % — ABNORMAL HIGH (ref 11.5–15.5)
RDW: 15.6 % — ABNORMAL HIGH (ref 11.5–15.5)
WBC: 9.9 K/uL (ref 4.0–10.5)
WBC: 7.9 K/uL (ref 4.0–10.5)
nRBC: 0 % (ref 0.0–0.2)
nRBC: 0 % (ref 0.0–0.2)

## 2024-06-18 LAB — POCT I-STAT 7, (LYTES, BLD GAS, ICA,H+H)
Acid-base deficit: 4 mmol/L — ABNORMAL HIGH (ref 0.0–2.0)
Bicarbonate: 21.9 mmol/L (ref 20.0–28.0)
Calcium, Ion: 1.19 mmol/L (ref 1.15–1.40)
HCT: 21 % — ABNORMAL LOW (ref 39.0–52.0)
Hemoglobin: 7.1 g/dL — ABNORMAL LOW (ref 13.0–17.0)
O2 Saturation: 99 %
Patient temperature: 36.8
Potassium: 4.4 mmol/L (ref 3.5–5.1)
Sodium: 131 mmol/L — ABNORMAL LOW (ref 135–145)
TCO2: 23 mmol/L (ref 22–32)
pCO2 arterial: 44.8 mmHg (ref 32–48)
pH, Arterial: 7.295 — ABNORMAL LOW (ref 7.35–7.45)
pO2, Arterial: 132 mmHg — ABNORMAL HIGH (ref 83–108)

## 2024-06-18 LAB — RENAL FUNCTION PANEL
Albumin: 2.6 g/dL — ABNORMAL LOW (ref 3.5–5.0)
Anion gap: 10 (ref 5–15)
BUN: 53 mg/dL — ABNORMAL HIGH (ref 8–23)
CO2: 22 mmol/L (ref 22–32)
Calcium: 8.2 mg/dL — ABNORMAL LOW (ref 8.9–10.3)
Chloride: 96 mmol/L — ABNORMAL LOW (ref 98–111)
Creatinine, Ser: 2.93 mg/dL — ABNORMAL HIGH (ref 0.61–1.24)
GFR, Estimated: 24 mL/min — ABNORMAL LOW (ref >60)
Glucose, Bld: 228 mg/dL — ABNORMAL HIGH (ref 70–99)
Phosphorus: 4.7 mg/dL — ABNORMAL HIGH (ref 2.5–4.6)
Potassium: 4.7 mmol/L (ref 3.5–5.1)
Sodium: 128 mmol/L — ABNORMAL LOW (ref 135–145)

## 2024-06-18 LAB — GLUCOSE, CAPILLARY
Glucose-Capillary: 180 mg/dL — ABNORMAL HIGH (ref 70–99)
Glucose-Capillary: 207 mg/dL — ABNORMAL HIGH (ref 70–99)
Glucose-Capillary: 216 mg/dL — ABNORMAL HIGH (ref 70–99)
Glucose-Capillary: 217 mg/dL — ABNORMAL HIGH (ref 70–99)
Glucose-Capillary: 228 mg/dL — ABNORMAL HIGH (ref 70–99)
Glucose-Capillary: 232 mg/dL — ABNORMAL HIGH (ref 70–99)
Glucose-Capillary: 234 mg/dL — ABNORMAL HIGH (ref 70–99)
Glucose-Capillary: 242 mg/dL — ABNORMAL HIGH (ref 70–99)

## 2024-06-18 LAB — CULTURE, BLOOD (ROUTINE X 2)
Culture: NO GROWTH
Culture: NO GROWTH
Special Requests: ADEQUATE

## 2024-06-18 LAB — HEPARIN LEVEL (UNFRACTIONATED): Heparin Unfractionated: 0.47 [IU]/mL (ref 0.30–0.70)

## 2024-06-18 LAB — LIDOCAINE LEVEL: Lidocaine Lvl: 4.5 ug/mL (ref 1.5–5.0)

## 2024-06-18 LAB — COOXEMETRY PANEL
Carboxyhemoglobin: 2.2 % — ABNORMAL HIGH (ref 0.5–1.5)
Methemoglobin: 0.9 % (ref 0.0–1.5)
O2 Saturation: 77.7 %
Total hemoglobin: 8.5 g/dL — ABNORMAL LOW (ref 12.0–16.0)

## 2024-06-18 LAB — PREPARE RBC (CROSSMATCH)

## 2024-06-18 LAB — MAGNESIUM: Magnesium: 2.3 mg/dL (ref 1.7–2.4)

## 2024-06-18 LAB — LACTIC ACID, PLASMA: Lactic Acid, Venous: 0.6 mmol/L (ref 0.5–1.9)

## 2024-06-18 MED ORDER — ORAL CARE MOUTH RINSE
15.0000 mL | OROMUCOSAL | Status: DC
  Administered 2024-06-18 – 2024-06-22 (×44): 15 mL via OROMUCOSAL

## 2024-06-18 MED ORDER — SORBITOL 70 % SOLN
30.0000 mL | Freq: Once | Status: AC
  Administered 2024-06-18: 30 mL
  Filled 2024-06-18: qty 30

## 2024-06-18 MED ORDER — INSULIN ASPART 100 UNIT/ML IJ SOLN
0.0000 [IU] | INTRAMUSCULAR | Status: DC
  Administered 2024-06-18: 5 [IU] via SUBCUTANEOUS
  Administered 2024-06-18: 3 [IU] via SUBCUTANEOUS
  Administered 2024-06-18 (×3): 5 [IU] via SUBCUTANEOUS
  Administered 2024-06-19: 3 [IU] via SUBCUTANEOUS
  Administered 2024-06-19 (×2): 5 [IU] via SUBCUTANEOUS

## 2024-06-18 MED ORDER — PIPERACILLIN-TAZOBACTAM 3.375 G IVPB: 3.3750 g | Freq: Three times a day (TID) | INTRAVENOUS | Status: DC

## 2024-06-18 MED ORDER — ORAL CARE MOUTH RINSE: 15.0000 mL | OROMUCOSAL | Status: DC | PRN

## 2024-06-18 MED ORDER — FUROSEMIDE 10 MG/ML IJ SOLN
160.0000 mg | Freq: Once | INTRAMUSCULAR | Status: AC
  Administered 2024-06-18: 160 mg via INTRAVENOUS
  Filled 2024-06-18: qty 10

## 2024-06-18 MED ORDER — SODIUM CHLORIDE 0.9% IV SOLUTION: Freq: Once | INTRAVENOUS | Status: AC

## 2024-06-18 MED ORDER — PIPERACILLIN-TAZOBACTAM 3.375 G IVPB
3.3750 g | Freq: Three times a day (TID) | INTRAVENOUS | Status: DC
  Administered 2024-06-18 – 2024-06-22 (×13): 3.375 g via INTRAVENOUS
  Filled 2024-06-18 (×13): qty 50

## 2024-06-18 MED ORDER — FUROSEMIDE 10 MG/ML IJ SOLN
30.0000 mg/h | INTRAVENOUS | Status: DC
  Administered 2024-06-18: 10 mg/h via INTRAVENOUS
  Administered 2024-06-19: 30 mg/h via INTRAVENOUS
  Administered 2024-06-19: 20 mg/h via INTRAVENOUS
  Administered 2024-06-20 – 2024-06-22 (×7): 30 mg/h via INTRAVENOUS
  Filled 2024-06-18 (×14): qty 20

## 2024-06-18 NOTE — Progress Notes (Cosign Needed)
 Patient ID: [name] [name], male   DOB: 01/06/1961, 63 y.o.   MRN: [id number]     Advanced Heart Failure Rounding Note  Cardiologist: Ezra Shuck, MD  Chief Complaint: Cardiogenic Shock Subjective:    Acute decompensation yesterday requiring maximal dose pressors, methylene blue, and maximal ventilator support. Also complicated by frequent and long runs of NSV, confirmed by device interrogation per EP. He was removed from CRRT at family request, daughter felt that it was worsening his decompensation. He was started on lido gtt.   Overnight, has come down on pressors. Remains on Epi 10, NE 20, VP 0.04, Milrinone 0.25 and sedation with versed /fent/dex. Rhythm has improved to 100% AV pacing on lido 1 and amio 60. Also started to make hourly urine overnight. Remains intubated and sedated on FiO2 90%.   Hemodynamics: CVP 17 PAP 43/28 CO/CI 15.3/5.55 SVR 366 Co-ox 78  Objective:    Weight Range: (!) 146.6 kg Body mass index is 39.34 kg/m.   Vital Signs:   Temp:  [98.2 F (36.8 C)-101.7 F (38.7 C)] 98.2 F (36.8 C) (10/01 0918) Pulse Rate:  [79-114] 80 (10/01 0918) Resp:  [5-32] 25 (10/01 0918) BP: (92-137)/(53-81) 125/77 (10/01 0900) SpO2:  [84 %-97 %] 97 % (10/01 0918) Arterial Line BP: (68-147)/(31-83) 129/54 (10/01 0918) FiO2 (%):  [80 %-100 %] 80 % (10/01 0755) Weight:  [146.6 kg] 146.6 kg (10/01 0500) Last BM Date :  (PTA)  Weight change: Filed Weights   06/16/24 0500 06/17/24 0500 06/18/24 0500  Weight: (!) 146.8 kg (!) 146.8 kg (!) 146.6 kg   Intake/Output:  Intake/Output Summary (Last 24 hours) at 06/18/2024 0956 Last data filed at 06/18/2024 0900 Gross per 24 hour  Intake 6418.57 ml  Output 2419.5 ml  Net 3999.07 ml    Physical Exam    General: Acutely-ill appearing Cardiac: 1 and S2 present. No murmurs Resp: Intubated. Rhonchi throughout, diminished bases Abdomen: Taut, distended.  Extremities: Warm and dry.  3+ generalized edema.  Neuro:  Sedated  Telemetry   AV pacing 80 (personally reviewed)  Labs    CBC Recent Labs    06/17/24 1342 06/17/24 1354 06/18/24 0430  WBC 21.6*  --  9.9  NEUTROABS 19.9*  --   --   HGB 8.1* 8.5* 7.3*  HCT 25.2* 25.0* 22.6*  MCV 97.3  --  97.8  PLT 232  --  181   Basic Metabolic Panel Recent Labs    90/69/74 0259 06/17/24 0838 06/17/24 1658 06/18/24 0430  NA 130*   < > 128* 128*  K 4.5   < > 4.8 4.7  CL 94*   < > 97* 96*  CO2 23   < > 22 22  GLUCOSE 179*   < > 243* 228*  BUN 58*   < > 51* 53*  CREATININE 2.57*   < > 2.94* 2.93*  CALCIUM  8.6*   < > 8.2* 8.2*  MG 2.2  --   --  2.3  PHOS 4.2  --  4.4 4.7*   < > = values in this interval not displayed.   Liver Function Tests Recent Labs    06/17/24 1102 06/17/24 1658 06/18/24 0430  AST 12*  --   --   ALT 17  --   --   ALKPHOS 87  --   --   BILITOT 0.7  --   --   PROT 6.7  --   --   ALBUMIN  3.1* 2.9* 2.6*   BNP (last 3 results)  Recent Labs    03/26/24 1413 04/28/24 1023 06/04/24 0204  BNP 326.3* 611.9* 219.1*   ProBNP (last 3 results) Recent Labs    06/13/24 1435  PROBNP 2,884.0*   Medications:    Scheduled Medications:  atorvastatin   80 mg Per Tube QPM   Chlorhexidine  Gluconate Cloth  6 each Topical Daily   docusate  100 mg Per Tube BID   famotidine  20 mg Per Tube Q0600   folic acid   1 mg Per Tube Daily   hydrocortisone sod succinate (SOLU-CORTEF) inj  100 mg Intravenous Q8H   insulin aspart  0-15 Units Subcutaneous Q4H   mouth rinse  15 mL Mouth Rinse Q2H   pantoprazole  (PROTONIX ) IV  40 mg Intravenous Q24H   polyethylene glycol  17 g Per Tube BID   senna-docusate  2 tablet Per Tube QHS   thiamine   100 mg Per Tube Daily   vancomycin  variable dose per unstable renal function (pharmacist dosing)   Does not apply See admin instructions    Infusions:  amiodarone  60 mg/hr (06/18/24 0934)   dexmedetomidine (PRECEDEX) IV infusion 0.8 mcg/kg/hr (06/18/24 0900)   EPINEPHrine  (ADRENALIN ) 10 mg in  sodium chloride  0.9 % 250 mL (0.04 mg/mL) infusion 10 mcg/min (06/18/24 0900)   fentaNYL  infusion INTRAVENOUS 175 mcg/hr (06/18/24 0900)   furosemide      heparin  2,800 Units/hr (06/18/24 0900)   lidocaine  0.5 mg/min (06/18/24 0900)   midazolam  2 mg/hr (06/18/24 0900)   milrinone 0.25 mcg/kg/min (06/18/24 0900)   norepinephrine (LEVOPHED) Adult infusion 20 mcg/min (06/18/24 0900)   piperacillin-tazobactam (ZOSYN)  IV     vasopressin 0.04 Units/min (06/18/24 0900)    PRN Medications: acetaminophen  **OR** acetaminophen , albuterol , butalbital -acetaminophen -caffeine , fentaNYL , midazolam , ondansetron  **OR** ondansetron  (ZOFRAN ) IV, mouth rinse, mouth rinse, oxyCODONE   Assessment/Plan   1. 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 Minimal CAD on cath in July 2016 and again in 9/24. [employer] CRT-D device. ETOH vs HTN vs myocarditis as cause of cardiomyopathy. Echo 12/22 and again in 9/24 showed that EF remains 20-25%.  PYP scan not suggestive of TTR cardiac amyloidosis.  RHC 5/25 showed R>L heart failure with preserved cardiac output by Fick and thermo but low PAPi.  He historically has very frequent NSVT and VT.  Last echo in 8/25 showed EF 20-25%, normal RV function per report, mild MR.  He was admitted with low BP (baseline SBP 90s) and marked volume overload.  Started on peripheral norepinephrine. RHC showed markedly elevated biventricular filling pressures but cardiac index normal by Fick and thermodilution and lactate normal. PAPi was low.   Milrinone begun for RV support. Persistent hypotension/vasoplegia.  - High CO/CI 15.3/5.55, Coox 78, CVP 19 - Remains on NE, Epi, VP for BP support, SVR remains low 360 - Would start to wean epi down slowly - Continue milrinone to 0.25 for RV support - Significantly volume, CRRT stopped yesterday at daughter's request. Overnight, started making some hourly urine  - Give lasix  160 mg daily, hope that urine will pick  up. If not will need to resume CRRT given respiratory compromise (daughter is amenable to restarting CRRT if needed) - Prognosis remains guarded, although have had some improvement. Discussed with family at bedside.  2. Paroxysmal atrial fibrillation:  - AF noted yesterday on live device interogation, but predominantly VT - Now AV pacing  - Continue amiodarone  60 - Continue heparin  gtt  3. ETOH abuse: Suspect this contributes to cardiomyopathy.  He has cut back but  still drinks moderately, asked him to cut back further.    4. VT: Had VT ablation at [facility] in 4/25 but still with frequent NSVT and VT.  Had VT with shock earlier this month. He has been referred to EP at [facility] for additional evaluation.  - developed frequent long runs of NSVT yesterday with hemodynamic compromise - continue amio gtt - decrease lido to 0.5, lido level pending  - 100% VP on tele  5. Anemia: s/p IV Fe and 1u RBCs - Hgb 8.1>7.3 today, suspect dilutional - needs volume removal  6. OSA: intubated.  7. AKI on CKD stage 3: Creatinine up to 4.47, baseline recently has been around 2.2-2.3.  Suspect cardiorenal with markedly elevated right-sided filling pressures. - CRRT started 9/29, after decompensation yesterday family required CRRT be stopped. Cr stable today 2.9 - started making hourly urine overnight - give 1x Lasix  160 mg IV, if unable to remove volume will need to resume CRRT. Hope for renal recovery.   8. Acute Hypoxic Respiratory Failure: due to pulmonary edema.   - now intubated - on FiO2 90% - vent mgmt per CCM - attempt diuresis as above  Family elected to make patient DNR (no compressions), but would like to continue with aggressive medical care.   CRITICAL CARE Performed by: [place] Lee  Total critical care time: 14 minutes  -Critical care time was exclusive of separately billable procedures and treating other patients. -Critical care was necessary to treat or prevent imminent or life-threatening  deterioration. -Critical care was time spent personally by me on the following activities: development of treatment plan with patient and/or surrogate as well as nursing, discussions with consultants, evaluation of patient's response to treatment, examination of patient, obtaining history from patient or surrogate, ordering and performing treatments and interventions, ordering and review of laboratory studies, ordering and review of radiographic studies, pulse oximetry and re-evaluation of patient's condition.  Length of Stay: 5  [place] Lee, NP  06/18/2024, 9:56 AM  Advanced Heart Failure Team Pager [phone] (M-F; 7a - 5p)  Please contact [facility] for night-coverage after hours (5p -7a ) and weekends on [email or web address]  Agree with above  Remains intubated/sedated. Pressor requirements coming down with treatment of sepsis.   Remains volume overloaded. Now making urine (family wanting avoid further CVVHD if possible)   VT has settled down on lido/amio.   General:  Sedated on vent. Ill appearing HEENT: + ETT/NGT Neck: supple. + RIJ HD cath Cor: Regular rate & rhythm Lungs: coarse Abdomen:  obese soft, nontender, nondistended. Hypoactive  bowel sounds. Extremities: no cyanosis, clubbing, rash, 1-2+ edema Neuro: intubated/sedated   Remains critically il with end-stage HF, acute on chronic resp failure and AKI due to shock. Outcome tenous.  Will continue to wean pressors as tolerate. GIVE IV lasix  and assess response.   CRITICAL CARE Performed by: Cherrie Sieving  Total critical care time: 45 minutes  Critical care time was exclusive of separately billable procedures and treating other patients.  Critical care was necessary to treat or prevent imminent or life-threatening deterioration.  Critical care was time spent personally by me (independent of midlevel providers or residents) on the following activities: development of treatment plan with patient and/or surrogate as well as  nursing, discussions with consultants, evaluation of patient's response to treatment, examination of patient, obtaining history from patient or surrogate, ordering and performing treatments and interventions, ordering and review of laboratory studies, ordering and review of radiographic studies, pulse oximetry and re-evaluation of patient's condition.  Toribio Fuel, MD  12:37 AM

## 2024-06-18 NOTE — Progress Notes (Addendum)
 NAMEFaris [name], MRN:  [id number], DOB:  Sep 02, 1961, LOS: 5 ADMISSION DATE:  06/13/2024, CONSULTATION DATE:  06/14/2024 REFERRING MD:  TRH, CHIEF COMPLAINT:  Cardiogenic shock    History of Present Illness:  [name] [name] is a 63 y.o. male with a PMH significant for HFrEF, severely dilated LV, nonischemic cardiomyopathy, recurrent/sustained V. tach requiring episodic ICD shocks, presence of biventricular ICD, paroxysmal atrial fibrillation, CKD stage IIIb, OSA on BiPAP, HTN, anxiety, and depression who presented to the ED at [facility] 9/26 for complaints of shortness of breath and nausea. Workup on admission revealed decompensated CHF with concerns to progression of cardiogenic shock prompting PCCM and heart failure consult.   Pertinent  Medical History  HFrEF, severely dilated LV, nonischemic cardiomyopathy, recurrent/sustained V. tach requiring episodic ICD shocks, presence of biventricular ICD, paroxysmal atrial fibrillation, CKD stage IIIb, OSA on BiPAP, HTN, anxiety, and depression   Significant Hospital Events: Including procedures, antibiotic start and stop dates in addition to other pertinent events   9/26 presented to [name] ED for complaints of shortness of breath, weight gain, and nausea  9/27 required initiation of vasopressor support for persistent hypotension in the setting of evolving cardiogenic shock. PCCM and HF consulted  9/30 worsening tachypnea and dyspnea requiring intubation, increased norepi support  10/1 rapidly decompensated yesterday requiring max pressors, lidocaine , amiodarone , INO, steroids and methylene blue   Interim History / Subjective:  CRRT held during period of rapidly worsening shock yesterday, UOP increased overnight and has responded to lasix  today Pressor requirement decreased Coox 77% Lactic 0.6, SVR 306 CVP 17   Objective    Blood pressure 125/77, pulse 80, temperature 98.2 F (36.8 C), resp. rate (!) 25, height 6' 4 (1.93 m), weight  (!) 146.6 kg, SpO2 97%. PAP: (32-60)/(17-43) 33/19 CVP:  [9 mmHg-31 mmHg] 10 mmHg CO:  [12.8 L/min-15.9 L/min] 12.8 L/min CI:  [4.7 L/min/m2-5.8 L/min/m2] 4.7 L/min/m2  Vent Mode: PRVC FiO2 (%):  [80 %-100 %] 80 % Set Rate:  [28 bmp] 28 bmp Vt Set:  [640 mL] 640 mL PEEP:  [8 cmH20] 8 cmH20 Plateau Pressure:  [21 cmH20-25 cmH20] 23 cmH20   Intake/Output Summary (Last 24 hours) at 06/18/2024 1026 Last data filed at 06/18/2024 1019 Gross per 24 hour  Intake 6418.57 ml  Output 2646.8 ml  Net 3771.77 ml   Filed Weights   06/16/24 0500 06/17/24 0500 06/18/24 0500  Weight: (!) 146.8 kg (!) 146.8 kg (!) 146.6 kg   General:  critically ill M intubated and sedated  HEENT: MM pink/moist, +JVD Neuro: alert and oriented  CV: s1s2 tachycardic, no m/r/g PULM:  diminished throughout on bipap without wheezing or rhonchi, tachypneic with accessory muscle use  GI: soft, distended  Extremities: warm/dry,      Resolved problem list  Hyperkalemia  Assessment and Plan  Acute on chronic HFrEF due to nonischemic cardiomyopathy now with cardiogenic shock and likely septic shock Hypervolemic hyponatremia Acute respiratory failure with hypoxia due to pulmonary edema +/- pneumonia  Paroxysmal A-fib Prior history of recurrent VT's status post biventricular AICD AKI on CKD stage IIIb  Anemia of chronic disease Morbid obesity/obstructive sleep apnea Alcohol abuse Obstipation with abdominal distention  -pt rapidly decompensated yesterday with refractory shock and hypoxia, was at one point on maximal Epi, Norepi, Vaso and had received methylene blue and stress dose steroids -continue iNO  -holding CRRT and lasix  challenge per HF, spoke with nephrology, they will sign off-reach out if needed  -continue Milrinone 0.25mcg/kg -Maintain full  vent support with SAT/SBT as tolerated -titrate Vent setting to maintain SpO2 greater than or equal to 90%. -HOB elevated 30 degrees. -continue broad spectrum abx  with Vanc/Zosyn, check viral panel  -Plateau pressures less than 30 cm H20.  -Follow chest x-ray, ABG prn.   -Bronchial hygiene and RT/bronchodilator protocol. - Heparin  gtt for arrhythmia  -trial sorbitol for constipation and then resume feeds as able  -Hgb 7.3, spoke with HF and will transfuse if Lasix  challenge successful and able to reduce volume overload -family updated at the bedside   CRITICAL CARE Performed by: Leita SAUNDERS Raimi Guillermo   Total critical care time: 40 minutes  Critical care time was exclusive of separately billable procedures and treating other patients.  Critical care was necessary to treat or prevent imminent or life-threatening deterioration.  Critical care was time spent personally by me on the following activities: development of treatment plan with patient and/or surrogate as well as nursing, discussions with consultants, evaluation of patient's response to treatment, examination of patient, obtaining history from patient or surrogate, ordering and performing treatments and interventions, ordering and review of laboratory studies, ordering and review of radiographic studies, pulse oximetry and re-evaluation of patient's condition.     Leita SAUNDERS Mikel Hardgrove, PA-C [facility] See Amion for pager If no response to pager , please call [phone] until 7pm After 7:00 pm call Elink  [phone]

## 2024-06-18 NOTE — Progress Notes (Signed)
 Initial Nutrition Assessment  DOCUMENTATION CODES:   Not applicable  INTERVENTION:   Recommend initiation of trickle TF within 24 hours or as soon as clinically feasible  Tube Feeding via OG:  Vital 1.5 at 20 ml/hr upon initiation Goal TF: Vital 1.5 at 60 ml/hr with Pro-source TF20 60 mL TID TF at goal provide 2400 kcals, 157 g of protein and 1094 mL of free water   Continue Thiamine  100 mg daily Continue Folic Acid  1 mg daily  NUTRITION DIAGNOSIS:   Inadequate oral intake related to acute illness as evidenced by NPO status.  GOAL:   Patient will meet greater than or equal to 90% of their needs  MONITOR:   Vent status, Labs, Weight trends, I & O's  REASON FOR ASSESSMENT:   Consult, Ventilator Assessment of nutrition requirement/status  ASSESSMENT:   63 yo admitted with acute on chronic CHF AKI on CKD3b, shock. PMH includes HFrEF, nonischemic CM, recurrent/sustained V.Tach, biventricular ICD, CKD 3b, OSA on BiPap, HTN, anxiety and depression  9/26 Admitted  9/27 Hypotension requiring vasopressors, cardiogenic shock 9/29 CRRT initiated 9/30 Intubated, significant decompensation, max pressors, methylene blue, max vent support changed to DNR  Pt remains on vent support, hemodynamics improved significantly from yesterday.  Levophed down to 10 mcg/min from 60 Epinephrine  down to 3 mcg/min down from 20 Vasopressin 0.04 units/min  Abdomen distended but not taut. Daughter at bedside and states belly does not appear much larger than at baseline currently but indicates abdomen less distended than yesterday No BM yet, BS present OG in place, small amount of dark bilious output in cannister today, noted 700 mL documented last night  Noted pt on po diet previously this admission but limited documentation of meal intakes. 2 meals documented, 1 at 25% and 1 at 90%. Noted poor appetite prior to admission with abd distention, early satiety, sob  CRRT off currently, held yesterday  with worsening shock. Currently on lasix  gtt UOP 1480 mL in 24 hours, 2.8 L thus far today.  Baseline Creatinine 2.2-2.3  Current wt 146.6 kg, dry weight unknown. Noted wt stable the last couple days.   Noted hx of EtOH use, cut back but still drinking moderately per MD notes  Labs: Sodium 129 (L) Potassium 4.2 (wdl) BUN 52  Creatinine 2.15 Phosphorus 4.7 (H) Magnesium  2.3 (wdl)  Meds: Colace Folic Acid  SS novolog Solucortef Miralax  Sorbitol x 1 Senna-Docusate Thiamine  100 mg daily  NUTRITION - FOCUSED PHYSICAL EXAM:  Flowsheet Row Most Recent Value  Orbital Region No depletion  Upper Arm Region Unable to assess  Thoracic and Lumbar Region Unable to assess  Buccal Region Unable to assess  Temple Region No depletion  Clavicle Bone Region No depletion  Clavicle and Acromion Bone Region No depletion  Scapular Bone Region No depletion  Dorsal Hand Unable to assess  Patellar Region Unable to assess  Anterior Thigh Region Unable to assess  Posterior Calf Region Unable to assess  Edema (RD Assessment) Moderate    Diet Order:   Diet Order             Diet NPO time specified  Diet effective now                   EDUCATION NEEDS:   Not appropriate for education at this time  Skin:  Skin Assessment: Reviewed RN Assessment  Last BM:  PTA  Height:   Ht Readings from Last 1 Encounters:  06/13/24 6' 4 (1.93 m)    Weight:  Wt Readings from Last 1 Encounters:  06/18/24 (!) 146.6 kg   BMI:  Body mass index is 39.34 kg/m.  Estimated Nutritional Needs:   Kcal:  2300-2500 kcals  Protein:  140-165 g  Fluid:  1.8 L   Betsey Finger MS, RDN, LDN, CNSC Registered Dietitian 3 Clinical Nutrition RD Inpatient Contact Info in Amion

## 2024-06-18 NOTE — Progress Notes (Signed)
 PHARMACY - ANTICOAGULATION CONSULT NOTE  Pharmacy Consult for heparin  Indication: atrial fibrillation  Labs: Recent Labs    06/15/24 0211 06/15/24 0429 06/16/24 0420 06/16/24 1213 06/17/24 0259 06/17/24 0838 06/17/24 1019 06/17/24 1102 06/17/24 1342 06/17/24 1344 06/17/24 1354 06/17/24 1658 06/17/24 2309  HGB  --    < > 7.0*   < > 8.3*   < > 9.5*  --  8.1*  --  8.5*  --   --   HCT  --    < > 21.1*   < > 25.5*   < > 28.0*  --  25.2*  --  25.0*  --   --   PLT  --    < > 246  --  205  --   --   --  232  --   --   --   --   APTT 31  --   --   --   --   --   --   --   --   --   --   --   --   HEPARINUNFRC 0.11*   < > 0.35   < > 0.26*  --   --   --   --  0.37  --   --  0.46  CREATININE  --    < > 4.34*   < > 2.57*  --   --  3.09*  --   --   --  2.94*  --    < > = values in this interval not displayed.   Assessment/Plan:  63yo male therapeutic on heparin  after rate change. Will continue infusion at current rate of 2800 units/hr and confirm stable with am labs.  Marvetta Dauphin, PharmD, BCPS 06/18/2024 12:34 AM

## 2024-06-18 NOTE — Progress Notes (Signed)
 Nephrology Follow-Up Consult note   Assessment/Recommendations: [name] [name] is a/an 63 y.o. male with a past medical history significant for CKD 3B, HFrEF, severely dilated LV, NICM, recurrent/sustained V. tach requiring episodic ICD shocks,BiV ICD, pAF, OSA on BiPAP, HTN, anxiety depression who presents to [facility] with decompensated heart failure c/f cardiogenic shock.  Anuric AKI (unchanged): Likely secondary to cardiogenic shock in the setting of severe HFrEF, severely dilated LV, NICM -He failed conservative measures with high-dose IV diuretics.  Worsening clinical status. -Chart reviewed: (medications acceptable, does not appear to have been exposed to nephrotoxins with imaging or had episodes of significant hypotension).   -Continue to monitor daily Cr, Dose meds for GFR -Monitor Daily I/Os, Daily weight  -Maintain MAP>65 for optimal renal perfusion.  -Avoid nephrotoxic medications including NSAIDs -Use synthetic opioids (Fentanyl /Dilaudid ) if needed -CRRT: 9/29-9/30 - Per family request stop CRRT.  Primary transition to IV diuretics.  Volume Status: Appears volume overloaded on exam. Based on our examination and review of available imaging, our recommendation is volume removal as above.  Cardiogenic shock: In the setting of acute on chronic systolic heart failure.  Followed by HF.  Failed conservative measures.  Cont volume removal as above   Hyponatremia: Worsening likely secondary to heart failure exacerbation.  Stable.  Monitor for now. Hyperphosphatemia: Likely secondary to worsening renal status.  Monitor for now.   # Normocytic anemia: Worsening.  Likely acute on chronic. -Transfuse for Hgb<7 g/dL -Defer to primary -pRBC x1, 9/29 -No role for ESA in setting of AKI  Acute hypoxemic respiratory failure: now intubated, per primary   At this point we will sign off.  Please reengage if additional services are needed.  Recommendations conveyed to primary service.    [name] [name]  [name] [name] [name] 06/18/2024 7:14 AM  ___________________________________________________________  CC: volume overload  Interval History/Subjective:  Primary had goals of care meeting.  At the time, decided to pause CRRT. Febrile last night; Tmax 101.7.  On epinephrine , milrinone, norepinephrine, vasopressin, amiodarone .   UOP 1.4 L.  Primary planning IV Lasix  160. Seen with multiple family members in the room.  All questions answered.  Medications:  Current Facility-Administered Medications  Medication Dose Route Frequency Provider Last Rate Last Admin   acetaminophen  (TYLENOL ) tablet 650 mg  650 mg Per Tube Q6H PRN Smith, Daniel C, MD   650 mg at 06/17/24 2133   Or   acetaminophen  (TYLENOL ) suppository 650 mg  650 mg Rectal Q6H PRN Claudene Toribio BROCKS, MD       albuterol  (PROVENTIL ) (2.5 MG/3ML) 0.083% nebulizer solution 2.5 mg  2.5 mg Nebulization Q2H PRN Zella, Mir M, MD       amiodarone  (NEXTERONE  PREMIX) 360-4.14 MG/200ML-% (1.8 mg/mL) IV infusion  60 mg/hr Intravenous Continuous Lee, [place], NP 33.3 mL/hr at 06/18/24 0600 60 mg/hr at 06/18/24 0600   atorvastatin  (LIPITOR ) tablet 80 mg  80 mg Per Tube QPM Claudene Toribio BROCKS, MD   80 mg at 06/17/24 2134   butalbital -acetaminophen -caffeine  (FIORICET) 50-325-40 MG per tablet 1 tablet  1 tablet Per Tube Q6H PRN Claudene Toribio BROCKS, MD       Chlorhexidine  Gluconate Cloth 2 % PADS 6 each  6 each Topical Daily Harold Scholz, MD   6 each at 06/17/24 0955   dexmedetomidine (PRECEDEX) 400 MCG/100ML (4 mcg/mL) infusion  0-1.2 mcg/kg/hr Intravenous Continuous Lee, [place], NP 29.4 mL/hr at 06/18/24 0600 0.8 mcg/kg/hr at 06/18/24 0600   docusate (COLACE) 50 MG/5ML liquid 100 mg  100 mg Per Tube  BID Claudene Toribio BROCKS, MD   100 mg at 06/17/24 2135   EPINEPHrine  (ADRENALIN ) 10 mg in sodium chloride  0.9 % 250 mL (0.04 mg/mL) infusion  0-20 mcg/min Intravenous Titrated Chen, Lydia D, RPH 13.5 mL/hr at 06/18/24 0600 9 mcg/min at 06/18/24 0600    famotidine (PEPCID) tablet 20 mg  20 mg Per Tube Q0600 Gleason, Leita SAUNDERS, PA-C       fentaNYL  (SUBLIMAZE ) bolus via infusion 25-100 mcg  25-100 mcg Intravenous Q15 min PRN Claudene Toribio BROCKS, MD   25 mcg at 06/17/24 1014   fentaNYL  in NS (32mcg/ml) infusion-PREMIX  50-300 mcg/hr Intravenous Continuous Sharie Bourbon, MD 17.5 mL/hr at 06/18/24 0600 175 mcg/hr at 06/18/24 0600   folic acid  (FOLVITE ) tablet 1 mg  1 mg Per Tube Daily Claudene Toribio BROCKS, MD       heparin  ADULT infusion 100 units/mL (25000 units/250mL)  2,800 Units/hr Intravenous Continuous Baruch Heather [name], RPH 28 mL/hr at 06/18/24 0600 2,800 Units/hr at 06/18/24 0600   hydrocortisone sodium succinate (SOLU-CORTEF) 100 MG injection 100 mg  100 mg Intravenous Q8H Hussein, Bourbon, MD   100 mg at 06/18/24 0347   insulin aspart (novoLOG) injection 0-6 Units  0-6 Units Subcutaneous Q4H Lee, [place], NP   3 Units at 06/18/24 0445   lidocaine  (cardiac) 2000 mg in dextrose  5% 500 mL (4mg /mL) IV infusion  1 mg/min Intravenous Continuous Lee, [place], NP 15 mL/hr at 06/18/24 0600 1 mg/min at 06/18/24 0600   midazolam  (VERSED ) 100 mg/100 mL (1 mg/mL) premix infusion  2-10 mg/hr Intravenous Continuous Sharie Bourbon, MD 2 mL/hr at 06/18/24 0600 2 mg/hr at 06/18/24 0600   midazolam  (VERSED ) bolus via infusion 1-2 mg  1-2 mg Intravenous Q2H PRN Hussein, Abdullahi, MD   2 mg at 06/17/24 2114   milrinone (PRIMACOR) 20 MG/100 ML (0.2 mg/mL) infusion  0.25 mcg/kg/min Intravenous Continuous Rolan Ezra RAMAN, MD 11.57 mL/hr at 06/18/24 0600 0.25 mcg/kg/min at 06/18/24 0600   norepinephrine (LEVOPHED) 16 mg in 250mL (0.064 mg/mL) premix infusion  0-60 mcg/min Intravenous Titrated Gleason, Leita SAUNDERS, PA-C 21.6 mL/hr at 06/18/24 0600 23 mcg/min at 06/18/24 0600   ondansetron  (ZOFRAN ) tablet 4 mg  4 mg Per Tube Q6H PRN Claudene Toribio BROCKS, MD       Or   ondansetron  (ZOFRAN ) injection 4 mg  4 mg Intravenous Q6H PRN Claudene Toribio BROCKS, MD       Oral care  mouth rinse  15 mL Mouth Rinse PRN Chand, Sudham, MD       Oral care mouth rinse  15 mL Mouth Rinse Q2H Claudene Toribio BROCKS, MD   15 mL at 06/18/24 0600   Oral care mouth rinse  15 mL Mouth Rinse PRN Claudene Toribio BROCKS, MD       oxyCODONE  (Oxy IR/ROXICODONE ) immediate release tablet 5 mg  5 mg Per Tube Q4H PRN Claudene Toribio BROCKS, MD       pantoprazole  (PROTONIX ) injection 40 mg  40 mg Intravenous Q24H Smith, Daniel C, MD   40 mg at 06/17/24 1518   polyethylene glycol (MIRALAX  / GLYCOLAX ) packet 17 g  17 g Per Tube BID Claudene Toribio BROCKS, MD   17 g at 06/17/24 2133   senna-docusate (Senokot-S) tablet 2 tablet  2 tablet Per Tube QHS Laron Agent, RPH       thiamine  (VITAMIN B1) tablet 100 mg  100 mg Per Tube Daily Claudene Toribio BROCKS, MD       vancomycin  variable dose per  unstable renal function (pharmacist dosing)   Does not apply See admin instructions Claudene Toribio BROCKS, MD       vasopressin (PITRESSIN) 20 Units in 100 mL (0.2 unit/mL) infusion-*FOR SHOCK*  0-0.04 Units/min Intravenous Continuous Claudene Toribio BROCKS, MD 12 mL/hr at 06/18/24 0600 0.04 Units/min at 06/18/24 0600      Review of Systems: 10 systems reviewed and negative except per interval history/subjective  Physical Exam: Vitals:   06/18/24 0645 06/18/24 0700  BP:  121/73  Pulse: 80 80  Resp: (!) 28 (!) 28  Temp: 98.8 F (37.1 C) 98.8 F (37.1 C)  SpO2: 96% 96%   No intake/output data recorded.  Intake/Output Summary (Last 24 hours) at 06/18/2024 9285 Last data filed at 06/18/2024 0600 Gross per 24 hour  Intake 6057.85 ml  Output 2616.6 ml  Net 3441.25 ml   Constitutional: Intubated and sedated, LIJ, Foley CV: normal rate, no edema Respiratory: Bilateral Breath sounds Gastrointestinal: soft, non-tender, no palpable masses or hernias Skin: no visible lesions or rashes Psych: Sedated   Test Results I personally reviewed new and old clinical labs and radiology tests Lab Results  Component Value Date   NA 128 (L) 06/18/2024   K  4.7 06/18/2024   CL 96 (L) 06/18/2024   CO2 22 06/18/2024   BUN 53 (H) 06/18/2024   CREATININE 2.93 (H) 06/18/2024   GFR 96.53 04/19/2015   CALCIUM  8.2 (L) 06/18/2024   ALBUMIN  2.6 (L) 06/18/2024   PHOS 4.7 (H) 06/18/2024    CBC Recent Labs  Lab 06/17/24 0259 06/17/24 0838 06/17/24 1342 06/17/24 1354 06/18/24 0430  WBC 9.5  --  21.6*  --  9.9  NEUTROABS  --   --  19.9*  --   --   HGB 8.3*   < > 8.1* 8.5* 7.3*  HCT 25.5*   < > 25.2* 25.0* 22.6*  MCV 96.6  --  97.3  --  97.8  PLT 205  --  232  --  181   < > = values in this interval not displayed.

## 2024-06-18 DEATH — deceased

## 2024-06-19 ENCOUNTER — Inpatient Hospital Stay (HOSPITAL_COMMUNITY)

## 2024-06-19 ENCOUNTER — Ambulatory Visit (HOSPITAL_COMMUNITY): Payer: Self-pay

## 2024-06-19 DIAGNOSIS — I480 Paroxysmal atrial fibrillation: Secondary | ICD-10-CM | POA: Diagnosis not present

## 2024-06-19 DIAGNOSIS — I5084 End stage heart failure: Secondary | ICD-10-CM

## 2024-06-19 DIAGNOSIS — I5082 Biventricular heart failure: Secondary | ICD-10-CM | POA: Diagnosis not present

## 2024-06-19 DIAGNOSIS — N189 Chronic kidney disease, unspecified: Secondary | ICD-10-CM

## 2024-06-19 DIAGNOSIS — R570 Cardiogenic shock: Secondary | ICD-10-CM | POA: Diagnosis not present

## 2024-06-19 LAB — CBC
HCT: 24.7 % — ABNORMAL LOW (ref 39.0–52.0)
Hemoglobin: 8.1 g/dL — ABNORMAL LOW (ref 13.0–17.0)
MCH: 31 pg (ref 26.0–34.0)
MCHC: 32.8 g/dL (ref 30.0–36.0)
MCV: 94.6 fL (ref 80.0–100.0)
Platelets: 159 K/uL (ref 150–400)
RBC: 2.61 MIL/uL — ABNORMAL LOW (ref 4.22–5.81)
RDW: 15.8 % — ABNORMAL HIGH (ref 11.5–15.5)
WBC: 9.5 K/uL (ref 4.0–10.5)
nRBC: 0 % (ref 0.0–0.2)

## 2024-06-19 LAB — RENAL FUNCTION PANEL
Albumin: 2.4 g/dL — ABNORMAL LOW (ref 3.5–5.0)
Albumin: 2.5 g/dL — ABNORMAL LOW (ref 3.5–5.0)
Anion gap: 12 (ref 5–15)
Anion gap: 15 (ref 5–15)
BUN: 53 mg/dL — ABNORMAL HIGH (ref 8–23)
BUN: 53 mg/dL — ABNORMAL HIGH (ref 8–23)
CO2: 23 mmol/L (ref 22–32)
CO2: 24 mmol/L (ref 22–32)
Calcium: 8.7 mg/dL — ABNORMAL LOW (ref 8.9–10.3)
Calcium: 8.6 mg/dL — ABNORMAL LOW (ref 8.9–10.3)
Chloride: 97 mmol/L — ABNORMAL LOW (ref 98–111)
Chloride: 95 mmol/L — ABNORMAL LOW (ref 98–111)
Creatinine, Ser: 2.76 mg/dL — ABNORMAL HIGH (ref 0.61–1.24)
Creatinine, Ser: 2.57 mg/dL — ABNORMAL HIGH (ref 0.61–1.24)
GFR, Estimated: 25 mL/min — ABNORMAL LOW (ref >60)
GFR, Estimated: 28 mL/min — ABNORMAL LOW (ref >60)
Glucose, Bld: 203 mg/dL — ABNORMAL HIGH (ref 70–99)
Glucose, Bld: 202 mg/dL — ABNORMAL HIGH (ref 70–99)
Phosphorus: 5.2 mg/dL — ABNORMAL HIGH (ref 2.5–4.6)
Phosphorus: 5 mg/dL — ABNORMAL HIGH (ref 2.5–4.6)
Potassium: 3.9 mmol/L (ref 3.5–5.1)
Potassium: 3.7 mmol/L (ref 3.5–5.1)
Sodium: 132 mmol/L — ABNORMAL LOW (ref 135–145)
Sodium: 134 mmol/L — ABNORMAL LOW (ref 135–145)

## 2024-06-19 LAB — BASIC METABOLIC PANEL WITH GFR
Anion gap: 16 — ABNORMAL HIGH (ref 5–15)
BUN: 53 mg/dL — ABNORMAL HIGH (ref 8–23)
CO2: 22 mmol/L (ref 22–32)
Calcium: 8.5 mg/dL — ABNORMAL LOW (ref 8.9–10.3)
Chloride: 94 mmol/L — ABNORMAL LOW (ref 98–111)
Creatinine, Ser: 2.65 mg/dL — ABNORMAL HIGH (ref 0.61–1.24)
GFR, Estimated: 27 mL/min — ABNORMAL LOW (ref >60)
Glucose, Bld: 204 mg/dL — ABNORMAL HIGH (ref 70–99)
Potassium: 3.6 mmol/L (ref 3.5–5.1)
Sodium: 132 mmol/L — ABNORMAL LOW (ref 135–145)

## 2024-06-19 LAB — POCT I-STAT 7, (LYTES, BLD GAS, ICA,H+H)
Acid-base deficit: 2 mmol/L (ref 0.0–2.0)
Bicarbonate: 24.5 mmol/L (ref 20.0–28.0)
Calcium, Ion: 1.16 mmol/L (ref 1.15–1.40)
HCT: 25 % — ABNORMAL LOW (ref 39.0–52.0)
Hemoglobin: 8.5 g/dL — ABNORMAL LOW (ref 13.0–17.0)
O2 Saturation: 99 %
Patient temperature: 36.6
Potassium: 3.9 mmol/L (ref 3.5–5.1)
Sodium: 130 mmol/L — ABNORMAL LOW (ref 135–145)
TCO2: 26 mmol/L (ref 22–32)
pCO2 arterial: 48.1 mmHg — ABNORMAL HIGH (ref 32–48)
pH, Arterial: 7.313 — ABNORMAL LOW (ref 7.35–7.45)
pO2, Arterial: 135 mmHg — ABNORMAL HIGH (ref 83–108)

## 2024-06-19 LAB — TYPE AND SCREEN
ABO/RH(D): O POS
Antibody Screen: NEGATIVE
Unit division: 0
Unit division: 0

## 2024-06-19 LAB — GLUCOSE, CAPILLARY
Glucose-Capillary: 169 mg/dL — ABNORMAL HIGH (ref 70–99)
Glucose-Capillary: 184 mg/dL — ABNORMAL HIGH (ref 70–99)
Glucose-Capillary: 186 mg/dL — ABNORMAL HIGH (ref 70–99)
Glucose-Capillary: 194 mg/dL — ABNORMAL HIGH (ref 70–99)
Glucose-Capillary: 202 mg/dL — ABNORMAL HIGH (ref 70–99)
Glucose-Capillary: 204 mg/dL — ABNORMAL HIGH (ref 70–99)
Glucose-Capillary: 227 mg/dL — ABNORMAL HIGH (ref 70–99)

## 2024-06-19 LAB — MAGNESIUM: Magnesium: 2 mg/dL (ref 1.7–2.4)

## 2024-06-19 LAB — BPAM RBC
Blood Product Expiration Date: 202510272359
Blood Product Expiration Date: 202510312359
ISSUE DATE / TIME: 202509291655
ISSUE DATE / TIME: 202510011737
Unit Type and Rh: 5100
Unit Type and Rh: 5100

## 2024-06-19 LAB — COOXEMETRY PANEL
Carboxyhemoglobin: 3.4 % — ABNORMAL HIGH (ref 0.5–1.5)
Methemoglobin: <0.7 % (ref 0.0–1.5)
O2 Saturation: 83.2 %
Total hemoglobin: 7 g/dL — ABNORMAL LOW (ref 12.0–16.0)

## 2024-06-19 LAB — LIDOCAINE LEVEL: Lidocaine Lvl: 3.9 ug/mL (ref 1.5–5.0)

## 2024-06-19 LAB — VANCOMYCIN, RANDOM: Vancomycin Rm: 10 ug/mL

## 2024-06-19 LAB — LACTIC ACID, PLASMA: Lactic Acid, Venous: 0.7 mmol/L (ref 0.5–1.9)

## 2024-06-19 LAB — HEPARIN LEVEL (UNFRACTIONATED): Heparin Unfractionated: 0.43 [IU]/mL (ref 0.30–0.70)

## 2024-06-19 MED ORDER — VANCOMYCIN HCL IN DEXTROSE 1-5 GM/200ML-% IV SOLN
1000.0000 mg | Freq: Once | INTRAVENOUS | Status: AC
  Administered 2024-06-19: 1000 mg via INTRAVENOUS
  Filled 2024-06-19: qty 200

## 2024-06-19 MED ORDER — LACTULOSE 10 GM/15ML PO SOLN
20.0000 g | ORAL | Status: AC
  Administered 2024-06-19 (×3): 20 g
  Filled 2024-06-19 (×3): qty 30

## 2024-06-19 MED ORDER — INSULIN ASPART 100 UNIT/ML IJ SOLN
0.0000 [IU] | INTRAMUSCULAR | Status: DC
  Administered 2024-06-19 – 2024-06-20 (×3): 4 [IU] via SUBCUTANEOUS
  Administered 2024-06-20: 7 [IU] via SUBCUTANEOUS
  Administered 2024-06-20 (×3): 4 [IU] via SUBCUTANEOUS
  Administered 2024-06-20: 7 [IU] via SUBCUTANEOUS
  Administered 2024-06-20 – 2024-06-21 (×2): 4 [IU] via SUBCUTANEOUS
  Administered 2024-06-21 (×3): 7 [IU] via SUBCUTANEOUS
  Administered 2024-06-21: 4 [IU] via SUBCUTANEOUS
  Administered 2024-06-21: 7 [IU] via SUBCUTANEOUS
  Administered 2024-06-22 (×2): 4 [IU] via SUBCUTANEOUS
  Administered 2024-06-22: 7 [IU] via SUBCUTANEOUS

## 2024-06-19 MED ORDER — METHYLNALTREXONE BROMIDE 12 MG/0.6ML ~~LOC~~ SOLN
12.0000 mg | Freq: Once | SUBCUTANEOUS | Status: AC
  Administered 2024-06-19: 12 mg via SUBCUTANEOUS
  Filled 2024-06-19: qty 0.6

## 2024-06-19 MED ORDER — POTASSIUM CHLORIDE 20 MEQ PO PACK
40.0000 meq | PACK | Freq: Once | ORAL | Status: AC
  Administered 2024-06-19: 40 meq
  Filled 2024-06-19: qty 2

## 2024-06-19 MED ORDER — VITAL 1.5 CAL PO LIQD: 1000.0000 mL | ORAL | Status: DC

## 2024-06-19 MED ORDER — LIDOCAINE HCL (CARDIAC) PF 100 MG/5ML IV SOSY
PREFILLED_SYRINGE | INTRAVENOUS | Status: AC
  Filled 2024-06-19: qty 5

## 2024-06-19 MED ORDER — VITAL HP 1.0 CAL PO LIQD: 1000.0000 mL | ORAL | Status: DC

## 2024-06-19 MED ORDER — METOLAZONE 5 MG PO TABS
5.0000 mg | ORAL_TABLET | Freq: Once | ORAL | Status: AC
  Administered 2024-06-19: 5 mg
  Filled 2024-06-19: qty 1

## 2024-06-19 NOTE — Progress Notes (Signed)
 Advanced Heart Failure Progress Update  On rounds this morning, patient developed long periods of NSVT and one sustained VT episode after CXR board was placed and removed. He received ICD shock x3. Lidocaine  was increased back to 1. Bolus was prepared to be administered, however patient converted to paced rhythm after 3rd shock.   I had a long discussion at the bedside with his daughter who was present for event. Discussed her wishes if an event like this were to happen again, as patient appeared in severe discomfort during shock therapy. She stated that she does not want to give up on him, however she does not want to do anything that will make him suffer. Elected to have device shock therapies inactivated, but we will continue aggressive medical management and volume removal. She is hopeful for recovery, however understands the severity of his condition and that his prognosis is very poor.   Order placed and device rep notified, will plan to turn off shock therapies.  [name] Aarian Cleaver, NP 06/19/24  Advanced Heart Failure Team Pager [phone] (M-F; 7a - 5p)  Please contact [facility] for night-coverage after hours (4p -7a ) and weekends on [email or web address]

## 2024-06-19 NOTE — Progress Notes (Signed)
 Nutrition Brief Note   Checked in with RN and PA today regarding patient status.   Worsening volume overload and increased FiO2 on vent with episodes of VT and shocked by ICD several times this morning.   Pressors requirements appear a bit more stable.  Epi @ 4 mcg/min Levo @ 15 mcg/min which has increased from yesterday but is now stable Vaso remains at 0.04 units/min  Per discussion with PA, CCM MD desires to initiate trickle tube feeds today given updated abdominal xray reflects nonobstructive bowel gas pattern.   Will place order for Vital 1.5 at 38ml/hr via OGT to begin today. Will monitor for tolerance and ability to advance further to goal.   Please reach out via secure chat with immediate nutrition related needs.  Allie Merari Pion, RDN, LDN Clinical Nutrition See AMiON for contact information.

## 2024-06-19 NOTE — Progress Notes (Signed)
 [name] [name], MRN:  [id number], DOB:  1961-05-22, LOS: 6 ADMISSION DATE:  06/13/2024, CONSULTATION DATE:  06/14/2024 REFERRING MD:  TRH, CHIEF COMPLAINT:  Cardiogenic shock    History of Present Illness:  [name] [name] is a 63 y.o. male with a PMH significant for HFrEF, severely dilated LV, nonischemic cardiomyopathy, recurrent/sustained V. tach requiring episodic ICD shocks, presence of biventricular ICD, paroxysmal atrial fibrillation, CKD stage IIIb, OSA on BiPAP, HTN, anxiety, and depression who presented to the ED at [facility] 9/26 for complaints of shortness of breath and nausea. Workup on admission revealed decompensated CHF with concerns to progression of cardiogenic shock prompting PCCM and heart failure consult.   Pertinent  Medical History  HFrEF, severely dilated LV, nonischemic cardiomyopathy, recurrent/sustained V. tach requiring episodic ICD shocks, presence of biventricular ICD, paroxysmal atrial fibrillation, CKD stage IIIb, OSA on BiPAP, HTN, anxiety, and depression   Significant Hospital Events: Including procedures, antibiotic start and stop dates in addition to other pertinent events   9/26 presented to [name] [name], ED for complaints of shortness of breath, weight gain, and nausea  9/27 required initiation of vasopressor support for persistent hypotension in the setting of evolving cardiogenic shock. PCCM and HF consulted  9/30 worsening tachypnea and dyspnea requiring intubation, increased norepi support  10/1 rapidly decompensated yesterday requiring max pressors, lidocaine , amiodarone , INO, steroids and methylene blue  10/2 pressor requirement stabilizing, worsening volume overload and increased FiO2 on vent, episode of VT and shocked by ICD several times   Interim History / Subjective:  No acute overnight events other than increased FiO2 requirement on ventilator and worsening edema on AM CXR  Episode of VT on lidocaine  and amiodarone  and was shocked by ICD, after  this family felt they did not want further shock therapy but did want to continue all other aggressive medical therapy   Objective    Blood pressure 112/70, pulse 93, temperature 98.2 F (36.8 C), resp. rate 15, height 6' 4 (1.93 m), weight (!) 136.9 kg, SpO2 99%. PAP: (24-56)/(14-40) 40/30 CVP:  [4 mmHg-24 mmHg] 15 mmHg CO:  [9.4 L/min] 9.4 L/min CI:  [3.41 L/min/m2] 3.41 L/min/m2  Vent Mode: PRVC FiO2 (%):  [60 %-100 %] 90 % Set Rate:  [28 bmp] 28 bmp Vt Set:  [640 mL] 640 mL PEEP:  [8 cmH20] 8 cmH20 Plateau Pressure:  [18 cmH20-24 cmH20] 24 cmH20   Intake/Output Summary (Last 24 hours) at 06/19/2024 1016 Last data filed at 06/19/2024 1000 Gross per 24 hour  Intake 4888.42 ml  Output 5225 ml  Net -336.58 ml   Filed Weights   06/17/24 0500 06/18/24 0500 06/19/24 0500  Weight: (!) 146.8 kg (!) 146.6 kg (!) 136.9 kg      General:  critically ill M intubated and sedated  HEENT: MM pink/moist, +JVD Neuro: alert and oriented  CV: s1s2 rrr, no m/r/g PULM: diminished throughout, intermittent vent dyssynchrony   GI: soft, distended  Extremities: warm/dry  Labs  Coox 83% Lactic 0.7 Glu 202 Creatinine 2.76 Hgb 8.1   Resolved problem list  Hyperkalemia  Assessment and Plan    Acute on chronic HFrEF due to nonischemic cardiomyopathy now with cardiogenic shock and likely septic shock Hypervolemic hyponatremia Acute respiratory failure with hypoxia due to pulmonary edema +/- pneumonia  Paroxysmal A-fib Prior history of recurrent VT's status post biventricular AICD -at one point on maximal Epi, Norepi, Vaso and had received methylene blue and stress dose steroids, shock physiology now stabilizing but remains on Levo 15mcg,  Epi 4mcg and vaso  0.04 -continue iNO  -continue Milrinone 0.25mcg/kg for AHF  -now on lasix  gtt increased to 20mg /hr and Metolazone  -cultures no growth, continue steroids and broad spectrum abx -Maintain full vent support with SAT/SBT as  tolerated -titrate Vent setting to maintain SpO2 greater than or equal to 90%. -HOB elevated 30 degrees. -continue broad spectrum abx with Vanc/Zosyn, check viral panel  -Plateau pressures less than 30 cm H20.  -Follow chest x-ray, ABG prn.   -Bronchial hygiene and RT/bronchodilator protocol. - Heparin  gtt for arrhythmia  -continue amiodarone  and lidocaine , after shocks this AM family would like to stop shock therapies     AKI on CKD stage IIIb  Anemia of chronic disease Morbid obesity/obstructive sleep apnea Alcohol abuse Obstipation with abdominal distention Still no BM despite adding sorbitol yesterday -trial Lactulose 20mg  q2hrs, does not tolerate movement well, so will try to avoid enemas -KUB without obstruction  -continue holding off CRRT for now -thiamine  and folic acid    Nutrition: start trickle feeds DVT prophylaxis: heparin  Stress ulcer prophylaxis: PPI  CRITICAL CARE Performed by: Leita SAUNDERS [name] [name]   Total critical care time: 35 minutes  Critical care time was exclusive of separately billable procedures and treating other patients.  Critical care was necessary to treat or prevent imminent or life-threatening deterioration.  Critical care was time spent personally by me on the following activities: development of treatment plan with patient and/or surrogate as well as nursing, discussions with consultants, evaluation of patient's response to treatment, examination of patient, obtaining history from patient or surrogate, ordering and performing treatments and interventions, ordering and review of laboratory studies, ordering and review of radiographic studies, pulse oximetry and re-evaluation of patient's condition.     Leita SAUNDERS [name] Baez, PA-C [facility] See Amion for pager If no response to pager , please call [phone] until 7pm After 7:00 pm call Elink  [phone]

## 2024-06-19 NOTE — Progress Notes (Signed)
 PHARMACY - ANTICOAGULATION CONSULT NOTE  Pharmacy Consult for heparin  Indication: atrial fibrillation  Allergies  Allergen Reactions   Nsaids Other (See Comments)    Contraindication due to CKD III   Sglt2 Inhibitors Other (See Comments)    Recurrent UTI    Patient Measurements: Height: 6' 4 (193 cm) Weight: (!) 136.9 kg (301 lb 13 oz) IBW/kg (Calculated) : 86.8 HEPARIN  DW (KG): 122.2  Vital Signs: Temp: 98.2 F (36.8 C) (10/02 0700) BP: 119/79 (10/02 0700) Pulse Rate: 80 (10/02 0700)  Labs: Recent Labs    06/17/24 2309 06/18/24 0430 06/18/24 1057 06/18/24 1336 06/18/24 2220 06/19/24 0353 06/19/24 0434  HGB  --  7.3*   < >  --  7.9* 8.1* 8.5*  HCT  --  22.6*   < >  --  24.1* 24.7* 25.0*  PLT  --  181  --   --  146* 159  --   HEPARINUNFRC 0.46 0.47  --   --   --  0.43  --   CREATININE  --  2.93*  --  2.15*  --  2.76*  --    < > = values in this interval not displayed.    Estimated Creatinine Clearance: 42.5 mL/min (A) (by C-G formula based on SCr of 2.76 mg/dL (H)).   Medical History: Past Medical History:  Diagnosis Date   AICD (automatic cardioverter/defibrillator) present    Arthritis    Atrial fibrillation (HCC)    Chronic HFrEF (heart failure with reduced ejection fraction) (HCC)    Chronic systolic heart failure (HCC)    Nonischemic CM: echo 4/12 with EF 25-30%, grade 2 diast dysfxn, mild dilated aortic root 43 mm, trivial MR, mod LAE   COPD (chronic obstructive pulmonary disease) (HCC)    Dyspnea    with exertion    H/O ETOH abuse    HTN (hypertension)    Hypersomnia    Mild CAD    Nonischemic cardiomyopathy (HCC)    Obesity, morbid (HCC)    OSA on CPAP    Tobacco use disorder    Ventricular tachycardia (HCC)    Assessment: 30 yoM admitted with cardiogenic shock. Pt on rivaroxaban  PTA for hx AF. Pt in renal failure and may potentially need MCS so will hold DOAC and use IV heparin . LD rivaroxaban  9/25.  06/19/24: Heparin  level 0.43,  therapeutic on heparin  2800 units/hr. No issues with infusion running or signs of bleeding per RN. CBC stable (Hgb 8.1, PLT 159).   Goal of Therapy:  Heparin  level 0.3-0.7 units/ml Monitor platelets by anticoagulation protocol: Yes   Plan:  Continue heparin  infusion at 2800 units/hr Recheck heparin  level in 6 hours Monitor daily heparin  level, CBC, and for s/sx of bleeding   Thank you for allowing pharmacy to participate in this patient's care,  Morna Breach, PharmD PGY2 Cardiology Pharmacy Resident 06/19/2024 7:31 AM  [facility] pharmacy phone numbers are listed on [email or web address]

## 2024-06-19 NOTE — Progress Notes (Addendum)
 Patient ID: [name] [name], male   DOB: 24-Oct-1960, 63 y.o.   MRN: [id number]     Advanced Heart Failure Rounding Note  Cardiologist: Ezra Shuck, MD  Chief Complaint: Cardiogenic Shock Subjective:    Started making urine yesterday, was placed on Lasix  gtt at 10/hr. Made 4.4L UOP (Net even with IVF and starting TF). sCr 2.15 > 2.76 Down on pressors. Remains on Epi 5, NE 13, VP 0.04 Given 1u RBCs. Hgb 7.1 > 8.1 Up on ventilatory support, now on FiO2 100%  Hemodynamics: CVP 23 PAP 35/29 CO/CI 9.4/3.42 SVR 315 Co-ox 83  Objective:    Weight Range: (!) 136.9 kg Body mass index is 36.74 kg/m.   Vital Signs:   Temp:  [97.3 F (36.3 C)-99 F (37.2 C)] 98.2 F (36.8 C) (10/02 0700) Pulse Rate:  [75-157] 80 (10/02 0700) Resp:  [13-33] 17 (10/02 0700) BP: (83-154)/(56-105) 119/79 (10/02 0700) SpO2:  [72 %-100 %] 95 % (10/02 0700) Arterial Line BP: (77-200)/(34-89) 106/42 (10/02 0700) FiO2 (%):  [60 %-100 %] 100 % (10/02 0352) Weight:  [136.9 kg] 136.9 kg (10/02 0500) Last BM Date :  (PTA)  Weight change: Filed Weights   06/17/24 0500 06/18/24 0500 06/19/24 0500  Weight: (!) 146.8 kg (!) 146.6 kg (!) 136.9 kg   Intake/Output:  Intake/Output Summary (Last 24 hours) at 06/19/2024 0730 Last data filed at 06/19/2024 0700 Gross per 24 hour  Intake 4682.69 ml  Output 5000 ml  Net -317.31 ml    Physical Exam    General: Critically-ill appearing.  Cardiac: S1 and S2 present. S3present No murmurs. Resp: Intubated. Rhonchus and coarse throughout Abdomen: Firm, non-tender, distended.  Extremities: Warm and wet.  3+ generalized edema.  Neuro: sedated  Telemetry   AV pacing 80 with intermittent NSVT (personally reviewed)  Labs    CBC Recent Labs    06/17/24 1342 06/17/24 1354 06/18/24 2220 06/19/24 0353 06/19/24 0434  WBC 21.6*   < > 7.9 9.5  --   NEUTROABS 19.9*  --   --   --   --   HGB 8.1*   < > 7.9* 8.1* 8.5*  HCT 25.2*   < > 24.1* 24.7* 25.0*  MCV 97.3   < >  94.9 94.6  --   PLT 232   < > 146* 159  --    < > = values in this interval not displayed.   Basic Metabolic Panel Recent Labs    89/98/74 0430 06/18/24 1057 06/18/24 1336 06/19/24 0353 06/19/24 0434  NA 128*   < > 129* 132* 130*  K 4.7   < > 4.2 3.9 3.9  CL 96*  --  96* 97*  --   CO2 22  --  23 23  --   GLUCOSE 228*  --  217* 203*  --   BUN 53*  --  52* 53*  --   CREATININE 2.93*  --  2.15* 2.76*  --   CALCIUM  8.2*  --  8.4* 8.7*  --   MG 2.3  --   --  2.0  --   PHOS 4.7*  --   --  5.2*  --    < > = values in this interval not displayed.   Liver Function Tests Recent Labs    06/17/24 1102 06/17/24 1658 06/18/24 0430 06/19/24 0353  AST 12*  --   --   --   ALT 17  --   --   --   ALKPHOS 87  --   --   --  BILITOT 0.7  --   --   --   PROT 6.7  --   --   --   ALBUMIN  3.1*   < > 2.6* 2.4*   < > = values in this interval not displayed.   BNP (last 3 results) Recent Labs    03/26/24 1413 04/28/24 1023 06/04/24 0204  BNP 326.3* 611.9* 219.1*   ProBNP (last 3 results) Recent Labs    06/13/24 1435  PROBNP 2,884.0*   Medications:    Scheduled Medications:  atorvastatin   80 mg Per Tube QPM   Chlorhexidine  Gluconate Cloth  6 each Topical Daily   docusate  100 mg Per Tube BID   famotidine  20 mg Per Tube Q0600   folic acid   1 mg Per Tube Daily   hydrocortisone sod succinate (SOLU-CORTEF) inj  100 mg Intravenous Q8H   insulin aspart  0-15 Units Subcutaneous Q4H   mouth rinse  15 mL Mouth Rinse Q2H   pantoprazole  (PROTONIX ) IV  40 mg Intravenous Q24H   polyethylene glycol  17 g Per Tube BID   senna-docusate  2 tablet Per Tube QHS   thiamine   100 mg Per Tube Daily   vancomycin  variable dose per unstable renal function (pharmacist dosing)   Does not apply See admin instructions    Infusions:  amiodarone  60 mg/hr (06/19/24 0700)   dexmedetomidine (PRECEDEX) IV infusion 1 mcg/kg/hr (06/19/24 0700)   EPINEPHrine  (ADRENALIN ) 10 mg in sodium chloride  0.9 % 250 mL  (0.04 mg/mL) infusion 5 mcg/min (06/19/24 0700)   fentaNYL  infusion INTRAVENOUS 225 mcg/hr (06/19/24 0700)   furosemide  (LASIX ) 200 mg in dextrose  5 % 100 mL (2 mg/mL) infusion 10 mg/hr (06/19/24 0700)   heparin  2,800 Units/hr (06/19/24 0700)   lidocaine  0.5 mg/min (06/19/24 0700)   midazolam  3 mg/hr (06/19/24 0700)   milrinone 0.25 mcg/kg/min (06/19/24 0729)   norepinephrine (LEVOPHED) Adult infusion 13 mcg/min (06/19/24 0700)   piperacillin-tazobactam (ZOSYN)  IV 12.5 mL/hr at 06/19/24 0700   vancomycin      vasopressin 0.04 Units/min (06/19/24 0700)    PRN Medications: acetaminophen  **OR** acetaminophen , albuterol , butalbital -acetaminophen -caffeine , fentaNYL , midazolam , ondansetron  **OR** ondansetron  (ZOFRAN ) IV, mouth rinse, oxyCODONE   Assessment/Plan   1. 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 Minimal CAD on cath in July 2016 and again in 9/24. [employer] CRT-D device. ETOH vs HTN vs myocarditis as cause of cardiomyopathy. Echo 12/22 and again in 9/24 showed that EF remains 20-25%.  PYP scan not suggestive of TTR cardiac amyloidosis.  RHC 5/25 showed R>L heart failure with preserved cardiac output by Fick and thermo but low PAPi.  He historically has very frequent NSVT and VT.  Last echo in 8/25 showed EF 20-25%, normal RV function per report, mild MR.  He was admitted with low BP (baseline SBP 90s) and marked volume overload.  Started on peripheral norepinephrine. RHC showed markedly elevated biventricular filling pressures but cardiac index normal by Fick and thermodilution and lactate normal. PAPi was low.   Milrinone begun for RV support. Persistent hypotension/vasoplegia.  - Hemodynamics as above - Remains on NE 13, Epi 5, VP 0.04. SVR in 300s. Continue to wean as able. - Continue milrinone to 0.25 for RV support - Made good UOP on Lasix  gtt, however with high IVF intake, remains slightly negative - Increase Lasix  gtt to 20 and give metolazone   5 mg today; limit IVF intake as able - Family is willing to restart CRRT if needed - Prognosis remains  guarded, although have had some improvement. Discussed with family at bedside.  2. Acute Hypoxic Respiratory Failure: due to pulmonary edema.   - now intubated - on FiO2 100% - vent mgmt per CCM - attempt diuresis as above  3. Paroxysmal atrial fibrillation:  - AF noted 9/30 on live device interogation, but predominantly VT - predominately AV pacing - Decrease amio to 30/hr - Continue heparin  gtt  4. VT: Had VT ablation at [facility] in 4/25 but still with frequent NSVT and VT.  Had VT with shock earlier this month. He has been referred to EP at [facility] for additional evaluation.  - developed frequent long runs of NSVT 9/30 with hemodynamic compromise - continue amio gtt 30/hr - continue lido to 0.5, lido level pending  - 100% VP on tele  5. AKI on CKD stage 3: Creatinine up to 4.47, baseline recently has been around 2.2-2.3.  Suspect cardiorenal with markedly elevated right-sided filling pressures. - CRRT started 9/28-9/29, after decompensation 9/29 family required CRRT be stopped.  - now making urine, however large intake with IV medications.  - sCr 2.9 >2.15>2.77 - plan as above  6. Anemia: s/p IV Fe  - given 1u RBC yesterday - Hgb 7.1 > 8.3 today, suspect dilutional - needs volume removal  7. OSA: intubated.  8. ETOH abuse: Suspect this contributes to cardiomyopathy.  He has cut back but still drinks moderately, asked him to cut back further.    Code Status: DNR, no compressions. Continue aggressive medical care.    CRITICAL CARE Performed by: [place] Lee  Total critical care time: 15 minutes  -Critical care time was exclusive of separately billable procedures and treating other patients. -Critical care was necessary to treat or prevent imminent or life-threatening deterioration. -Critical care was time spent personally by me on the following activities: development of  treatment plan with patient and/or surrogate as well as nursing, discussions with consultants, evaluation of patient's response to treatment, examination of patient, obtaining history from patient or surrogate, ordering and performing treatments and interventions, ordering and review of laboratory studies, ordering and review of radiographic studies, pulse oximetry and re-evaluation of patient's condition.  Length of Stay: 6  [place] Lee, NP  06/19/2024, 7:30 AM  Advanced Heart Failure Team Pager [phone] (M-F; 7a - 5p)  Please contact [facility] for night-coverage after hours (5p -7a ) and weekends on [email or web address]  Agree with above  Remains intubated. Will arouse at times  Remains on multiple pressors. Multiple ICD shocks last night. Diuresing well   Having frequent VT  General:  Critically ill on vent   HEENT: +ETT Neck: supple. RIJ catheter Cor: PMI nondisplaced. Tachy irregular Lungs: coarse Abdomen: obese + distended.  Extremities: no cyanosis, clubbing, rash, 3+ edema Neuro: sedated on vent  He remains critically ill with MSOF in setting of end-stage HF. Long talk with family about GOC. They want to press on but do not want him to suffer. No CPR or shocks. Otherwise continue full scope of care for now.   Continue aggressive diuresis with inotrope/pressor support.   CRITICAL CARE Performed by: Cherrie Sieving  Total critical care time: 45 minutes  Critical care time was exclusive of separately billable procedures and treating other patients.  Critical care was necessary to treat or prevent imminent or life-threatening deterioration.  Critical care was time spent personally by me (independent of midlevel providers or residents) on the following activities: development of treatment plan with patient and/or surrogate as well as nursing, discussions with  consultants, evaluation of patient's response to treatment, examination of patient, obtaining history from patient or  surrogate, ordering and performing treatments and interventions, ordering and review of laboratory studies, ordering and review of radiographic studies, pulse oximetry and re-evaluation of patient's condition.  Toribio Fuel, MD  12:54 PM

## 2024-06-19 NOTE — Inpatient Diabetes Management (Signed)
 Inpatient Diabetes Program Recommendations  AACE/ADA: New Consensus Statement on Inpatient Glycemic Control   Target Ranges:  Prepandial:   less than 140 mg/dL      Peak postprandial:   less than 180 mg/dL (1-2 hours)      Critically ill patients:  140 - 180 mg/dL   Lab Results  Component Value Date   GLUCAP 202 (H) 06/19/2024   HGBA1C 5.5 06/17/2024    Review of Glycemic Control  Diabetes history: none Outpatient Diabetes medications: none Current orders for Inpatient glycemic control:  Novolog 0-15 units q4hrs  Note: patient on Solucortef 100mg  q8hrs.    Inpatient Diabetes Program Recommendations:   While patient is receiving steroids, please consider starting Lantus 5 units BID  Thanks,  Lavanda Search, RN, MSN, [facility]  Inpatient Diabetes Coordinator  Pager [phone] (8a-5p)

## 2024-06-19 NOTE — Progress Notes (Signed)
 Pharmacy Antibiotic Note  [name] [name] is a 63 y.o. male admitted on 06/13/2024 with sepsis.  Pharmacy has been consulted for vancomycin  dosing. Of note, the patient was on CRRT from 9/29 until it was withdrawn per family's wishes on 9/30. Based on renal function of 3.09 (while on CRRT), wt of 146.8 kg and Vd 0.5, next dose after load would be due in 48 hours (morning of 10/2). Patient also has not had any UOP on 9/30.  06/19/2024: Patient remains of CRRT with good UOP. WBC 9.5, afebrile. Scr increasing from 2.15 to 2.76 mg/dL today. Vancomycin  random level checked this morning was 10 mcg/mL, ~40 hours after the initial vancomycin  2500 mg IV x1. Will re-dose at 1000 mg x1 today and plan to recheck vancomycin  random level tomorrow with AM labs. Respiratory culture with GNRs and GPCs.   Plan: Vancomycin  1000 mg IV x1 Zosyn 3.375 gm IV every 8 hours Recheck vancomycin  random level in 24 hours Monitor cultures, clinical status, renal function, vancomycin  level Narrow abx as able and f/u duration    Height: 6' 4 (193 cm) Weight: (!) 136.9 kg (301 lb 13 oz) IBW/kg (Calculated) : 86.8  Temp (24hrs), Avg:98.1 F (36.7 C), Min:97.3 F (36.3 C), Max:99 F (37.2 C)  Recent Labs  Lab 06/17/24 0259 06/17/24 0839 06/17/24 1102 06/17/24 1105 06/17/24 1342 06/17/24 1344 06/17/24 1658 06/18/24 0430 06/18/24 1336 06/18/24 2220 06/19/24 0353 06/19/24 0432  WBC 9.5  --   --   --  21.6*  --   --  9.9  --  7.9 9.5  --   CREATININE 2.57*  --  3.09*  --   --   --  2.94* 2.93* 2.15*  --  2.76*  --   LATICACIDVEN  --    < > 0.5 0.7  --  0.6  --  0.6  --   --   --  0.7  VANCORANDOM  --   --   --   --   --   --   --   --   --   --  10  --    < > = values in this interval not displayed.    Estimated Creatinine Clearance: 42.5 mL/min (A) (by C-G formula based on SCr of 2.76 mg/dL (H)).    Allergies  Allergen Reactions   Nsaids Other (See Comments)    Contraindication due to CKD III   Sglt2 Inhibitors  Other (See Comments)    Recurrent UTI    Antimicrobials this admission: none  Microbiology results: 10/1: GNRs and GPCs 9/26: ngtd x5 days 9/26 BCx: pending 9/27 MRSA PCR: not detected  Thank you for allowing pharmacy to be a part of this patient's care.  Morna Breach, PharmD PGY2 Cardiology Pharmacy Resident 06/19/2024 7:48 AM

## 2024-06-20 DIAGNOSIS — I4891 Unspecified atrial fibrillation: Secondary | ICD-10-CM

## 2024-06-20 DIAGNOSIS — R570 Cardiogenic shock: Secondary | ICD-10-CM | POA: Diagnosis not present

## 2024-06-20 DIAGNOSIS — I5082 Biventricular heart failure: Secondary | ICD-10-CM | POA: Diagnosis not present

## 2024-06-20 DIAGNOSIS — I480 Paroxysmal atrial fibrillation: Secondary | ICD-10-CM | POA: Diagnosis not present

## 2024-06-20 DIAGNOSIS — I5084 End stage heart failure: Secondary | ICD-10-CM | POA: Diagnosis not present

## 2024-06-20 LAB — RENAL FUNCTION PANEL
Albumin: 2.4 g/dL — ABNORMAL LOW (ref 3.5–5.0)
Albumin: 2.2 g/dL — ABNORMAL LOW (ref 3.5–5.0)
Anion gap: 13 (ref 5–15)
Anion gap: 14 (ref 5–15)
BUN: 56 mg/dL — ABNORMAL HIGH (ref 8–23)
BUN: 58 mg/dL — ABNORMAL HIGH (ref 8–23)
CO2: 24 mmol/L (ref 22–32)
CO2: 24 mmol/L (ref 22–32)
Calcium: 8.7 mg/dL — ABNORMAL LOW (ref 8.9–10.3)
Calcium: 8.6 mg/dL — ABNORMAL LOW (ref 8.9–10.3)
Chloride: 96 mmol/L — ABNORMAL LOW (ref 98–111)
Chloride: 96 mmol/L — ABNORMAL LOW (ref 98–111)
Creatinine, Ser: 2.73 mg/dL — ABNORMAL HIGH (ref 0.61–1.24)
Creatinine, Ser: 2.89 mg/dL — ABNORMAL HIGH (ref 0.61–1.24)
GFR, Estimated: 26 mL/min — ABNORMAL LOW (ref >60)
GFR, Estimated: 24 mL/min — ABNORMAL LOW (ref >60)
Glucose, Bld: 205 mg/dL — ABNORMAL HIGH (ref 70–99)
Glucose, Bld: 201 mg/dL — ABNORMAL HIGH (ref 70–99)
Phosphorus: 5.2 mg/dL — ABNORMAL HIGH (ref 2.5–4.6)
Phosphorus: 5.2 mg/dL — ABNORMAL HIGH (ref 2.5–4.6)
Potassium: 3.7 mmol/L (ref 3.5–5.1)
Potassium: 3.3 mmol/L — ABNORMAL LOW (ref 3.5–5.1)
Sodium: 133 mmol/L — ABNORMAL LOW (ref 135–145)
Sodium: 134 mmol/L — ABNORMAL LOW (ref 135–145)

## 2024-06-20 LAB — COOXEMETRY PANEL
Carboxyhemoglobin: 2.2 % — ABNORMAL HIGH (ref 0.5–1.5)
Methemoglobin: 1.1 % (ref 0.0–1.5)
O2 Saturation: 83.8 %
Total hemoglobin: 8.5 g/dL — ABNORMAL LOW (ref 12.0–16.0)

## 2024-06-20 LAB — CULTURE, RESPIRATORY W GRAM STAIN: Gram Stain: NONE SEEN

## 2024-06-20 LAB — MAGNESIUM
Magnesium: 1.9 mg/dL (ref 1.7–2.4)
Magnesium: 2.4 mg/dL (ref 1.7–2.4)

## 2024-06-20 LAB — POTASSIUM: Potassium: 3.3 mmol/L — ABNORMAL LOW (ref 3.5–5.1)

## 2024-06-20 LAB — CBC
HCT: 26.5 % — ABNORMAL LOW (ref 39.0–52.0)
Hemoglobin: 8.6 g/dL — ABNORMAL LOW (ref 13.0–17.0)
MCH: 31 pg (ref 26.0–34.0)
MCHC: 32.5 g/dL (ref 30.0–36.0)
MCV: 95.7 fL (ref 80.0–100.0)
Platelets: 165 K/uL (ref 150–400)
RBC: 2.77 MIL/uL — ABNORMAL LOW (ref 4.22–5.81)
RDW: 15.8 % — ABNORMAL HIGH (ref 11.5–15.5)
WBC: 11.2 K/uL — ABNORMAL HIGH (ref 4.0–10.5)
nRBC: 0 % (ref 0.0–0.2)

## 2024-06-20 LAB — VANCOMYCIN, RANDOM: Vancomycin Rm: 14 ug/mL

## 2024-06-20 LAB — GLUCOSE, CAPILLARY
Glucose-Capillary: 177 mg/dL — ABNORMAL HIGH (ref 70–99)
Glucose-Capillary: 187 mg/dL — ABNORMAL HIGH (ref 70–99)
Glucose-Capillary: 188 mg/dL — ABNORMAL HIGH (ref 70–99)
Glucose-Capillary: 200 mg/dL — ABNORMAL HIGH (ref 70–99)
Glucose-Capillary: 202 mg/dL — ABNORMAL HIGH (ref 70–99)
Glucose-Capillary: 211 mg/dL — ABNORMAL HIGH (ref 70–99)

## 2024-06-20 LAB — HEPARIN LEVEL (UNFRACTIONATED): Heparin Unfractionated: 0.31 [IU]/mL (ref 0.30–0.70)

## 2024-06-20 LAB — LACTIC ACID, PLASMA: Lactic Acid, Venous: 0.6 mmol/L (ref 0.5–1.9)

## 2024-06-20 LAB — LIDOCAINE LEVEL: Lidocaine Lvl: 6 ug/mL — ABNORMAL HIGH (ref 1.5–5.0)

## 2024-06-20 MED ORDER — POTASSIUM CHLORIDE 20 MEQ PO PACK: 40.0000 meq | PACK | ORAL | Status: DC

## 2024-06-20 MED ORDER — POTASSIUM CHLORIDE 20 MEQ PO PACK
40.0000 meq | PACK | ORAL | Status: AC
  Administered 2024-06-20 (×2): 40 meq
  Filled 2024-06-20 (×2): qty 2

## 2024-06-20 MED ORDER — VANCOMYCIN HCL IN DEXTROSE 1-5 GM/200ML-% IV SOLN
1000.0000 mg | Freq: Once | INTRAVENOUS | Status: AC
  Administered 2024-06-20: 1000 mg via INTRAVENOUS
  Filled 2024-06-20: qty 200

## 2024-06-20 MED ORDER — POTASSIUM CHLORIDE 10 MEQ/50ML IV SOLN
INTRAVENOUS | Status: AC
  Administered 2024-06-20: 10 meq via INTRAVENOUS
  Filled 2024-06-20: qty 50

## 2024-06-20 MED ORDER — MAGNESIUM SULFATE 4 GM/100ML IV SOLN
4.0000 g | Freq: Once | INTRAVENOUS | Status: AC
  Administered 2024-06-20: 4 g via INTRAVENOUS
  Filled 2024-06-20: qty 100

## 2024-06-20 MED ORDER — VITAL 1.5 CAL PO LIQD
480.0000 mL | ORAL | Status: DC
Start: 2024-06-20 — End: 2024-06-23
  Administered 2024-06-20 – 2024-06-21 (×2): 480 mL
  Filled 2024-06-20 (×2): qty 1000
  Filled 2024-06-20: qty 711
  Filled 2024-06-20: qty 1000

## 2024-06-20 MED ORDER — POTASSIUM CHLORIDE 10 MEQ/50ML IV SOLN
10.0000 meq | INTRAVENOUS | Status: AC
  Administered 2024-06-20 – 2024-06-21 (×5): 10 meq via INTRAVENOUS
  Filled 2024-06-20 (×5): qty 50

## 2024-06-20 MED ORDER — INSULIN GLARGINE 100 UNIT/ML ~~LOC~~ SOLN
5.0000 [IU] | Freq: Two times a day (BID) | SUBCUTANEOUS | Status: DC
Start: 2024-06-20 — End: 2024-06-22
  Administered 2024-06-20 – 2024-06-22 (×5): 5 [IU] via SUBCUTANEOUS
  Filled 2024-06-20 (×6): qty 0.05

## 2024-06-20 NOTE — Progress Notes (Addendum)
 eLink Physician-Brief Progress Note Patient Name: Galvin Aversa DOB: 1960-11-10 MRN: [id number]   Date of Service  06/20/2024  HPI/Events of Note  K3.3 despite KCl earlier today, AKI  eICU Interventions  KCl   0635 -radiograph reviewed in the setting of worsening dyspnea.  ET tube stable, worsening pulmonary edema and right-sided pleural effusion.  Recommend more aggressive fluid management, increase PEEP to 10, limited options for treatment of distress, utilize opiates  Intervention Category Minor Interventions: Electrolytes abnormality - evaluation and management  Omie Ferger 06/20/2024, 9:53 PM

## 2024-06-20 NOTE — Progress Notes (Signed)
 Pharmacy Antibiotic Note  [name] [name] is a 63 y.o. male admitted on 06/13/2024 with sepsis.  Pharmacy has been consulted for vancomycin  dosing. Of note, the patient was on CRRT from 9/29 until it was withdrawn per family's wishes on 9/30. Based on renal function of 3.09 (while on CRRT), wt of 146.8 kg and Vd 0.5, next dose after load would be due in 48 hours (morning of 10/2). Patient also has not had any UOP on 9/30.  06/20/2024: Patient remains of CRRT with good UOP. WBC up to 11.2, afebrile. Scr stable at 2.73 mg/dL today. Vancomycin  random level checked this morning was 14 mcg/mL, ~20 hours after vancomycin  1000 mg IV x1. Will re-dose at 1000 mg x1 today and plan to recheck vancomycin  random level tomorrow with AM labs. Respiratory culture with klebsiella pneumoniae and neisseria cinerea, CCM team aware.   Plan: Repeat vancomycin  1000 mg IV x1 Zosyn 3.375 gm IV every 8 hours Recheck vancomycin  random level in 24 hours Monitor cultures, clinical status, renal function, vancomycin  level Narrow abx as able and f/u duration    Height: 6' 4 (193 cm) Weight: (!) 147.6 kg (325 lb 6.4 oz) IBW/kg (Calculated) : 86.8  Temp (24hrs), Avg:98.9 F (37.2 C), Min:98.1 F (36.7 C), Max:99.9 F (37.7 C)  Recent Labs  Lab 06/17/24 1105 06/17/24 1342 06/17/24 1344 06/17/24 1658 06/18/24 0430 06/18/24 1336 06/18/24 2220 06/19/24 0353 06/19/24 0432 06/19/24 1331 06/20/24 0430  WBC  --  21.6*  --   --  9.9  --  7.9 9.5  --   --  11.2*  CREATININE  --   --   --    < > 2.93* 2.15*  --  2.76*  --  2.57*  2.65* 2.73*  LATICACIDVEN 0.7  --  0.6  --  0.6  --   --   --  0.7  --  0.6  VANCORANDOM  --   --   --   --   --   --   --  10  --   --  14   < > = values in this interval not displayed.    Estimated Creatinine Clearance: 44.7 mL/min (A) (by C-G formula based on SCr of 2.73 mg/dL (H)).    Allergies  Allergen Reactions   Nsaids Other (See Comments)    Contraindication due to CKD III   Sglt2  Inhibitors Other (See Comments)    Recurrent UTI    Antimicrobials this admission: none  Microbiology results: 10/1: klebsiella pneumoniae and neisseria cinerea 9/26: ngtd x5 days 9/26 BCx: pending 9/27 MRSA PCR: not detected  Thank you for allowing pharmacy to be a part of this patient's care.  Morna Breach, PharmD PGY2 Cardiology Pharmacy Resident 06/20/2024 8:04 AM

## 2024-06-20 NOTE — Progress Notes (Signed)
 Nutrition Brief Note  Spoke with CCM NP regarding patient status.  Family elected to stop ICD and now DNR. Plan to start trickle feeds today and follow for any changes.   Will place order for Vital 1.5 at 67ml/hr via OGT to begin today. Will monitor for tolerance and ability to advance further to goal.    Please reach out via secure chat with immediate nutrition related needs.   Nestora Glatter RD, LDN Clinical Dietitian

## 2024-06-20 NOTE — Progress Notes (Signed)
 PHARMACY - ANTICOAGULATION CONSULT NOTE  Pharmacy Consult for heparin  Indication: atrial fibrillation  Allergies  Allergen Reactions   Nsaids Other (See Comments)    Contraindication due to CKD III   Sglt2 Inhibitors Other (See Comments)    Recurrent UTI    Patient Measurements: Height: 6' 4 (193 cm) Weight: (!) 147.6 kg (325 lb 6.4 oz) IBW/kg (Calculated) : 86.8 HEPARIN  DW (KG): 122.2  Vital Signs: Temp: 99 F (37.2 C) (10/03 0800) Temp Source: Core (10/03 0000) BP: 115/77 (10/03 0800) Pulse Rate: 92 (10/03 0800)  Labs: Recent Labs    06/18/24 0430 06/18/24 1057 06/18/24 2220 06/19/24 0353 06/19/24 0434 06/19/24 1331 06/20/24 0430  HGB 7.3*   < > 7.9* 8.1* 8.5*  --  8.6*  HCT 22.6*   < > 24.1* 24.7* 25.0*  --  26.5*  PLT 181  --  146* 159  --   --  165  HEPARINUNFRC 0.47  --   --  0.43  --   --  0.31  CREATININE 2.93*   < >  --  2.76*  --  2.57*  2.65* 2.73*   < > = values in this interval not displayed.    Estimated Creatinine Clearance: 44.7 mL/min (A) (by C-G formula based on SCr of 2.73 mg/dL (H)).   Medical History: Past Medical History:  Diagnosis Date   AICD (automatic cardioverter/defibrillator) present    Arthritis    Atrial fibrillation (HCC)    Chronic HFrEF (heart failure with reduced ejection fraction) (HCC)    Chronic systolic heart failure (HCC)    Nonischemic CM: echo 4/12 with EF 25-30%, grade 2 diast dysfxn, mild dilated aortic root 43 mm, trivial MR, mod LAE   COPD (chronic obstructive pulmonary disease) (HCC)    Dyspnea    with exertion    H/O ETOH abuse    HTN (hypertension)    Hypersomnia    Mild CAD    Nonischemic cardiomyopathy (HCC)    Obesity, morbid (HCC)    OSA on CPAP    Tobacco use disorder    Ventricular tachycardia (HCC)    Assessment: 35 yoM admitted with cardiogenic shock. Pt on rivaroxaban  PTA for hx AF. Pt in renal failure and may potentially need MCS so will hold DOAC and use IV heparin . LD rivaroxaban   9/25.  06/20/24: Heparin  level 0.31, therapeutic on heparin  2800 units/hr. No issues with infusion running or signs of bleeding per RN. CBC stable (Hgb 8.6, PLT 165).   Goal of Therapy:  Heparin  level 0.3-0.7 units/ml Monitor platelets by anticoagulation protocol: Yes   Plan:  Continue heparin  infusion at 2800 units/hr Monitor daily heparin  level, CBC, and for s/sx of bleeding   Thank you for allowing pharmacy to participate in this patient's care,  Morna Breach, PharmD PGY2 Cardiology Pharmacy Resident 06/20/2024 8:04 AM  [facility] pharmacy phone numbers are listed on [email or web address]

## 2024-06-20 NOTE — Progress Notes (Addendum)
 Patient ID: [name] [name], male   DOB: May 09, 1961, 62 y.o.   MRN: [id number]     Advanced Heart Failure Rounding Note  Cardiologist: Ezra Shuck, MD   Chief Complaint: Cardiogenic Shock  Subjective:    Pressor requirements up slightly overnight. Currently on Epi 4, NE 22, Vaso 0.04.   On lasix  gtt at 30/hr and got 5 mg metolazone  yesterday. 3.2L UOP last 24 hrs, but net + ~1L. Scr 2.57 > 2.73.   Hemodynamics: CVP 12 PA 38/27 CO 11.1 CI 4.02 CO-OX 84%  Remains intubated. FiO2 80%. PEEP 8. iNO at 20 ppm. Low grade temp overnight. Respiratory culture grew Klebsiella and beta lactamase + Neisseria cinerea.   Vpaced with frequent NSVT on lidocaine  gtt and amiodarone  gtt. Lidocaine  level elevated.   Objective:    Weight Range: (!) 147.6 kg Body mass index is 39.61 kg/m.   Vital Signs:   Temp:  [98.2 F (36.8 C)-99.9 F (37.7 C)] 98.8 F (37.1 C) (10/03 1000) Pulse Rate:  [86-130] 86 (10/03 1000) Resp:  [14-31] 22 (10/03 1000) BP: (101-137)/(62-88) 118/72 (10/03 1000) SpO2:  [75 %-100 %] 99 % (10/03 1000) Arterial Line BP: (97-174)/(41-71) 128/49 (10/03 1000) FiO2 (%):  [80 %-100 %] 80 % (10/03 0800) Weight:  [147.6 kg] 147.6 kg (10/03 0459) Last BM Date :  (pta)  Weight change: Filed Weights   06/18/24 0500 06/19/24 0500 06/20/24 0459  Weight: (!) 146.6 kg (!) 136.9 kg (!) 147.6 kg   Intake/Output:  Intake/Output Summary (Last 24 hours) at 06/20/2024 1045 Last data filed at 06/20/2024 0900 Gross per 24 hour  Intake 4724.66 ml  Output 4385 ml  Net 339.66 ml    Physical Exam    General:  Critically ill appearing. Cor: JVP difficult d/t b/l internal jugular access. Regular rate & rhythm. No murmurs. Lungs: nonlabored on vent, coarse Abdomen: distended Extremities: 2+ diffuse edema Neuro: sedated   Telemetry   AV pacing with frequent runs NSVT  Labs    CBC Recent Labs    06/17/24 1342 06/17/24 1354 06/19/24 0353 06/19/24 0434 06/20/24 0430  WBC  21.6*   < > 9.5  --  11.2*  NEUTROABS 19.9*  --   --   --   --   HGB 8.1*   < > 8.1* 8.5* 8.6*  HCT 25.2*   < > 24.7* 25.0* 26.5*  MCV 97.3   < > 94.6  --  95.7  PLT 232   < > 159  --  165   < > = values in this interval not displayed.   Basic Metabolic Panel Recent Labs    89/97/74 0353 06/19/24 0434 06/19/24 1331 06/20/24 0430  NA 132*   < > 134*  132* 133*  K 3.9   < > 3.7  3.6 3.7  CL 97*  --  95*  94* 96*  CO2 23  --  24  22 24   GLUCOSE 203*  --  202*  204* 205*  BUN 53*  --  53*  53* 56*  CREATININE 2.76*  --  2.57*  2.65* 2.73*  CALCIUM  8.7*  --  8.6*  8.5* 8.7*  MG 2.0  --   --  1.9  PHOS 5.2*  --  5.0* 5.2*   < > = values in this interval not displayed.   Liver Function Tests Recent Labs    06/17/24 1102 06/17/24 1658 06/19/24 1331 06/20/24 0430  AST 12*  --   --   --  ALT 17  --   --   --   ALKPHOS 87  --   --   --   BILITOT 0.7  --   --   --   PROT 6.7  --   --   --   ALBUMIN  3.1*   < > 2.5* 2.4*   < > = values in this interval not displayed.   BNP (last 3 results) Recent Labs    03/26/24 1413 04/28/24 1023 06/04/24 0204  BNP 326.3* 611.9* 219.1*   ProBNP (last 3 results) Recent Labs    06/13/24 1435  PROBNP 2,884.0*   Medications:    Scheduled Medications:  atorvastatin   80 mg Per Tube QPM   Chlorhexidine  Gluconate Cloth  6 each Topical Daily   docusate  100 mg Per Tube BID   famotidine  20 mg Per Tube Q0600   folic acid   1 mg Per Tube Daily   hydrocortisone sod succinate (SOLU-CORTEF) inj  100 mg Intravenous Q8H   insulin aspart  0-20 Units Subcutaneous Q4H   mouth rinse  15 mL Mouth Rinse Q2H   pantoprazole  (PROTONIX ) IV  40 mg Intravenous Q24H   polyethylene glycol  17 g Per Tube BID   potassium chloride   40 mEq Per Tube Q4H   senna-docusate  2 tablet Per Tube QHS   thiamine   100 mg Per Tube Daily   vancomycin  variable dose per unstable renal function (pharmacist dosing)   Does not apply See admin instructions     Infusions:  amiodarone  30 mg/hr (06/20/24 0800)   dexmedetomidine (PRECEDEX) IV infusion 1.1 mcg/kg/hr (06/20/24 0800)   EPINEPHrine  (ADRENALIN ) 10 mg in sodium chloride  0.9 % 250 mL (0.04 mg/mL) infusion 4 mcg/min (06/20/24 0800)   fentaNYL  infusion INTRAVENOUS 275 mcg/hr (06/20/24 0800)   furosemide  (LASIX ) 200 mg in dextrose  5 % 100 mL (2 mg/mL) infusion 30 mg/hr (06/20/24 0905)   heparin  2,800 Units/hr (06/20/24 0800)   lidocaine  1 mg/min (06/20/24 0800)   magnesium  sulfate bolus IVPB     midazolam  3 mg/hr (06/20/24 0800)   milrinone 0.25 mcg/kg/min (06/20/24 0800)   norepinephrine (LEVOPHED) Adult infusion 22 mcg/min (06/20/24 0800)   piperacillin-tazobactam (ZOSYN)  IV Stopped (06/20/24 0636)   vasopressin 0.04 Units/min (06/20/24 0800)    PRN Medications: acetaminophen  **OR** acetaminophen , albuterol , butalbital -acetaminophen -caffeine , fentaNYL , midazolam , ondansetron  **OR** ondansetron  (ZOFRAN ) IV, mouth rinse, oxyCODONE   Assessment/Plan   1. 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 Minimal CAD on cath in July 2016 and again in 9/24. [employer] CRT-D device. ETOH vs HTN vs myocarditis as cause of cardiomyopathy. Echo 12/22 and again in 9/24 showed that EF remains 20-25%.  PYP scan not suggestive of TTR cardiac amyloidosis.  RHC 5/25 showed R>L heart failure with preserved cardiac output by Fick and thermo but low PAPi.  He historically has very frequent NSVT and VT.  Last echo in 8/25 showed EF 20-25%, normal RV function per report, mild MR.  He was admitted with low BP (baseline SBP 90s) and marked volume overload.  Started on peripheral norepinephrine. RHC showed markedly elevated biventricular filling pressures but cardiac index normal by Fick and thermodilution and lactate normal. PAPi was low.   Milrinone begun for RV support. Persistent hypotension/vasoplegia.  - Hemodynamics as above - Remains on NE 22, Epi 4, VP 0.04. CI 4 and CO-OX  84%. SVR has been in 300s for days. Appears to have more septic physiology at this point. Wean milrinone to 0.125  and epi to 3. Recheck hemodynamics in a couple of hours. Discussed abx with CCM. - CVP 12. Lasix  gtt at 30/hr. Will hold off on metolazone  for now with rising pressure requirement. - Prognosis extremely guarded. Minimal progress overnight.  2. Acute Hypoxic Respiratory Failure: due to pulmonary edema.   - now intubated - on FiO2 80% - vent mgmt per CCM - attempting diuresis - sputum growing klebsiella and neisseria cinerea. Curbside ID regarding abx.   3. Paroxysmal atrial fibrillation:  - AF noted 9/30 on live device interogation, but predominantly VT - predominately AV pacing - Amiodarone  gtt increased to 60/hr (see below) - Continue heparin  gtt  4. VT: Had VT ablation at [facility] in 4/25 but still with frequent NSVT and VT.  Had VT with shock earlier this month. He has been referred to EP at [facility] for additional evaluation.  - developed frequent long runs of NSVT 9/30 with hemodynamic compromise - Multiple shocks from device for VT on 10/2. ICD function now deactivated - Lidocaine  level is 6, reduce to 0.5 - Increase amiodarone  to 60/hr - 100% VP on tele  5. AKI on CKD stage 3: Creatinine up to 4.47, baseline recently has been around 2.2-2.3.  Suspect cardiorenal with markedly elevated right-sided filling pressures. - CRRT started 9/28-9/29, after decompensation 9/29 family required CRRT be stopped.  - sCr 2.9 >2.15>2.77>2.73 - making urine but net + with IV intake - plan as above  6. Anemia: s/p IV Fe  - given 1u RBC 10/1 - Hgb 7.1 > 8.1>8.6  7. OSA: intubated.  8. ETOH abuse: Suspect this contributes to cardiomyopathy.    Code Status: DNR - No CPR or shocks  -ICD deactivated 10/2 after multiple shocks -Ongoing GOC discussions with family  CRITICAL CARE Performed by: COLLETTA MANUELITA SAILOR   Total critical care time: 20 minutes  Critical care time was exclusive of  separately billable procedures and treating other patients.  Critical care was necessary to treat or prevent imminent or life-threatening deterioration.  Critical care was time spent personally by me on the following activities: development of treatment plan with patient and/or surrogate as well as nursing, discussions with consultants, evaluation of patient's response to treatment, examination of patient, obtaining history from patient or surrogate, ordering and performing treatments and interventions, ordering and review of laboratory studies, ordering and review of radiographic studies, pulse oximetry and re-evaluation of patient's condition.   Length of Stay: 7  FINCH, LINDSAY N, PA-C  06/20/2024, 10:45 AM  Advanced Heart Failure Team Pager [phone] (M-F; 7a - 5p)  Please contact [facility] for night-coverage after hours (5p -7a ) and weekends on [email or web address]  Agree with above  Continues to deteriorate. Now with Neisseria cinera in sputum. Remains intubated/sedated with increasing pressure requirements  and high output concerning for septic physiology on top of severe biventricular failure  Continues to make decent amount of urine. Having frequent VT despite amio and lido.   General:  Critically ill appearing. Sedated on vent HEENT: + ETT Neck: supple. RIJ HD cath LIJ swan Cor: Tachy irregular Lungs: coarse Abdomen: obese soft, nontender, + distended Extremities: no cyanosis, clubbing, rash, 1+ edema Neuro: intubated/sedated  He is end-stage and likely terminally ill. Ongoing discussions with family regarding level of support desired. He is DNR - no CPR or shocks. Otherwise continue to support. We have discussed abx regimen with ID. Continue pressors.  Will consult Palliative Care over the weekend.   CRITICAL CARE Performed by: Cherrie Sieving  Total critical  care time: 50 minutes  Critical care time was exclusive of separately billable procedures and treating other  patients.  Critical care was necessary to treat or prevent imminent or life-threatening deterioration.  Critical care was time spent personally by me (independent of midlevel providers or residents) on the following activities: development of treatment plan with patient and/or surrogate as well as nursing, discussions with consultants, evaluation of patient's response to treatment, examination of patient, obtaining history from patient or surrogate, ordering and performing treatments and interventions, ordering and review of laboratory studies, ordering and review of radiographic studies, pulse oximetry and re-evaluation of patient's condition.  Toribio Fuel, MD  8:37 PM

## 2024-06-20 NOTE — Inpatient Diabetes Management (Signed)
 Inpatient Diabetes Program Recommendations  AACE/ADA: New Consensus Statement on Inpatient Glycemic Control   Target Ranges:  Prepandial:   less than 140 mg/dL      Peak postprandial:   less than 180 mg/dL (1-2 hours)      Critically ill patients:  140 - 180 mg/dL   Lab Results  Component Value Date   GLUCAP 211 (H) 06/20/2024   HGBA1C 5.5 06/17/2024    Latest Reference Range & Units 06/19/24 11:34 06/19/24 16:05 06/19/24 20:39 06/19/24 23:57 06/20/24 03:55 06/20/24 07:36  Glucose-Capillary 70 - 99 mg/dL 797 (H) 813 (H) 830 (H) 184 (H) 200 (H) 211 (H)   Review of Glycemic Control   Diabetes history: none Outpatient Diabetes medications: none Current orders for Inpatient glycemic control:  Novolog 0-20 units q4hrs   Noted:  On Solucortef 100mg  q8hrs   Inpatient Diabetes Program Recommendations:   Please consider starting Lantus 5 units BID.   Thanks,  Lavanda Search, RN, MSN, [facility]  Inpatient Diabetes Coordinator  Pager [phone] (8a-5p)

## 2024-06-20 NOTE — Progress Notes (Signed)
 NAMEDemani [name], MRN:  [id number], DOB:  November 30, 1960, LOS: 7 ADMISSION DATE:  06/13/2024, CONSULTATION DATE:  06/14/2024 REFERRING MD:  TRH, CHIEF COMPLAINT:  Cardiogenic shock    History of Present Illness:  [name] [name] is a 63 y.o. male with a PMH significant for HFrEF, severely dilated LV, nonischemic cardiomyopathy, recurrent/sustained V. tach requiring episodic ICD shocks, presence of biventricular ICD, paroxysmal atrial fibrillation, CKD stage IIIb, OSA on BiPAP, HTN, anxiety, and depression who presented to the ED at [facility] 9/26 for complaints of shortness of breath and nausea. Workup on admission revealed decompensated CHF with concerns to progression of cardiogenic shock prompting PCCM and heart failure consult.   Pertinent  Medical History  HFrEF, severely dilated LV, nonischemic cardiomyopathy, recurrent/sustained V. tach requiring episodic ICD shocks, presence of biventricular ICD, paroxysmal atrial fibrillation, CKD stage IIIb, OSA on BiPAP, HTN, anxiety, and depression   Significant Hospital Events: Including procedures, antibiotic start and stop dates in addition to other pertinent events   9/26 presented to [name] [name], ED for complaints of shortness of breath, weight gain, and nausea  9/27 required initiation of vasopressor support for persistent hypotension in the setting of evolving cardiogenic shock. PCCM and HF consulted  9/30 worsening tachypnea and dyspnea requiring intubation, increased norepi support  10/1 rapidly decompensated yesterday requiring max pressors, lidocaine , amiodarone , INO, steroids and methylene blue  10/2 pressor requirement stabilizing, worsening volume overload and increased FiO2 on vent, episode of VT and shocked by ICD several times  10/3 ICD turned off yesterday per family request, but had continue wanting further aggressive therapy.   Interim History / Subjective:  Intubated, sedated Still having episodes of NSVT overnight- during episodes  becomes hypotensive despite pressors, and hypoxic   On epi, levo, vas, amio, lidocaine , milrinone gtt Lasix  gtt  Heparin  gtt   Fio2 80%, 8 of PEEP   On iNO   Objective    Blood pressure 115/77, pulse 92, temperature 99 F (37.2 C), resp. rate (!) 23, height 6' 4 (1.93 m), weight (!) 147.6 kg, SpO2 100%. PAP: (37-51)/(27-40) 38/28 CVP:  [15 mmHg-18 mmHg] 15 mmHg CO:  [11.3 L/min-14.2 L/min] 13.9 L/min CI:  [4.11 L/min/m2-5.15 L/min/m2] 5.06 L/min/m2  Vent Mode: PRVC FiO2 (%):  [80 %-100 %] 80 % Set Rate:  [28 bmp] 28 bmp Vt Set:  [640 mL] 640 mL PEEP:  [8 cmH20] 8 cmH20 Plateau Pressure:  [23 cmH20-29 cmH20] 23 cmH20   Intake/Output Summary (Last 24 hours) at 06/20/2024 0853 Last data filed at 06/20/2024 0800 Gross per 24 hour  Intake 5384.06 ml  Output 4435 ml  Net 949.06 ml   Filed Weights   06/18/24 0500 06/19/24 0500 06/20/24 0459  Weight: (!) 146.6 kg (!) 136.9 kg (!) 147.6 kg   General: acute on chronic adult male, lying in icu bed on vent  HEENT: Normocephalic, PERRLA intact, ETT, OG, Pink MM CV: s1,s2, irregular- episodes of PVCs and , no MRG, No JVD  pulm: clear, diminished, no distress on vent  Abs: bs active, soft  Extremities: generalized edema, responds to pain  Skin: no rash  Neuro: Rass -3-sedated, responds to painful stimuli, cough gag reflex present  GU: foley intact - blue tinged urine    Resolved problem list  Hyperkalemia  Assessment and Plan    Acute on chronic HFrEF due to nonischemic cardiomyopathy now with cardiogenic shock and likely septic shock Hypervolemic hyponatremia Acute respiratory failure with hypoxia due to pulmonary edema +/- pneumonia  Paroxysmal A-fib  Prior history of recurrent VT's status post biventricular AICD -at one point on maximal Epi, Norepi, Vaso and had received methylene blue and stress dose steroids, shock physiology now stabilizing but remains on Levo 22mcg, Epi 6mcg and vaso  0.04 Had periods of NSVT with  shocking via ICD, family opted to hold off shock therapy but continue aggressive therapy. Patient had NSVT episodes even with stimulation.  P:  Continue epi, levo, vaso, MAP goal > 65 Continue milrinone and lasix  gtt per AHF team, appreciate assistance  Continue lidocaine  and amio for now, lido levels elevated- may need to decrease but patient still having periods of NSVT Continue zosyn and vanc, GNRs/GPCs noted on respiratory culture- continue to follow  Continue ventilator support and lung protective strategies  Continue LTVV  Wean PEEP and Fio2 requirements to sat goal of >92%  HOB > 30 degrees Plat < 30  Aim for Driving pressures < 15  Intermittent Chest X-ray and ABGS VAP and PAD protocols in place  Hold off WUA/SBT due to elevated vent requirements  Unfortunately, prognosis is guarded. Daughter beside and understands that patient is not showing signs of improvement. Still holding to some hope but realizes that her and family will more than likely have to make difficult decisions. Continue DNR status, recommend focusing on comfort-once family is ready. Continue GOC with daughter and family.   AKI on CKD stage IIIb  Hyponatremia Anemia of chronic disease Morbid obesity/obstructive sleep apnea Alcohol abuse Obstipation with abdominal distention Cr slight increase from yesterday on 10/2 2.57 > 2.73, on lasix  gtt, with adequate urine output Still no BM despite adding sorbitol, unable to give lactulose due to periods of NSVT yesterday  P: Try starting trickle feeds for now  Continue lasix  gtt  Continue thiamine  and folic acid   Continue monitor sodium daily with other electrolytes   Nutrition: start trickle feeds 10/3  DVT prophylaxis: heparin  Stress ulcer prophylaxis: PPI  CRITICAL CARE  CC: 50 mins   [name] [name] AGACNP-BC   [facility] 06/20/2024, 11:00 AM  Please see [email or web address] for pager details.  From 7A-7P if no response, please call  [phone]. After hours, please call ELink [phone].

## 2024-06-21 ENCOUNTER — Inpatient Hospital Stay (HOSPITAL_COMMUNITY)

## 2024-06-21 DIAGNOSIS — I5082 Biventricular heart failure: Secondary | ICD-10-CM | POA: Diagnosis not present

## 2024-06-21 DIAGNOSIS — I480 Paroxysmal atrial fibrillation: Secondary | ICD-10-CM | POA: Diagnosis not present

## 2024-06-21 DIAGNOSIS — R570 Cardiogenic shock: Secondary | ICD-10-CM | POA: Diagnosis not present

## 2024-06-21 DIAGNOSIS — I5084 End stage heart failure: Secondary | ICD-10-CM | POA: Diagnosis not present

## 2024-06-21 LAB — POCT I-STAT 7, (LYTES, BLD GAS, ICA,H+H)
Acid-Base Excess: 0 mmol/L (ref 0.0–2.0)
Bicarbonate: 27.5 mmol/L (ref 20.0–28.0)
Calcium, Ion: 1.23 mmol/L (ref 1.15–1.40)
HCT: 28 % — ABNORMAL LOW (ref 39.0–52.0)
Hemoglobin: 9.5 g/dL — ABNORMAL LOW (ref 13.0–17.0)
O2 Saturation: 95 %
Patient temperature: 99.9
Potassium: 3.6 mmol/L (ref 3.5–5.1)
Sodium: 134 mmol/L — ABNORMAL LOW (ref 135–145)
TCO2: 29 mmol/L (ref 22–32)
pCO2 arterial: 59.6 mmHg — ABNORMAL HIGH (ref 32–48)
pH, Arterial: 7.276 — ABNORMAL LOW (ref 7.35–7.45)
pO2, Arterial: 89 mmHg (ref 83–108)

## 2024-06-21 LAB — RENAL FUNCTION PANEL
Albumin: 2.2 g/dL — ABNORMAL LOW (ref 3.5–5.0)
Albumin: 2.1 g/dL — ABNORMAL LOW (ref 3.5–5.0)
Anion gap: 12 (ref 5–15)
Anion gap: 14 (ref 5–15)
BUN: 61 mg/dL — ABNORMAL HIGH (ref 8–23)
BUN: 61 mg/dL — ABNORMAL HIGH (ref 8–23)
CO2: 25 mmol/L (ref 22–32)
CO2: 25 mmol/L (ref 22–32)
Calcium: 8.3 mg/dL — ABNORMAL LOW (ref 8.9–10.3)
Calcium: 8.3 mg/dL — ABNORMAL LOW (ref 8.9–10.3)
Chloride: 96 mmol/L — ABNORMAL LOW (ref 98–111)
Chloride: 99 mmol/L (ref 98–111)
Creatinine, Ser: 2.71 mg/dL — ABNORMAL HIGH (ref 0.61–1.24)
Creatinine, Ser: 2.79 mg/dL — ABNORMAL HIGH (ref 0.61–1.24)
GFR, Estimated: 26 mL/min — ABNORMAL LOW (ref >60)
GFR, Estimated: 25 mL/min — ABNORMAL LOW (ref >60)
Glucose, Bld: 216 mg/dL — ABNORMAL HIGH (ref 70–99)
Glucose, Bld: 223 mg/dL — ABNORMAL HIGH (ref 70–99)
Phosphorus: 4.7 mg/dL — ABNORMAL HIGH (ref 2.5–4.6)
Phosphorus: 4.4 mg/dL (ref 2.5–4.6)
Potassium: 3.8 mmol/L (ref 3.5–5.1)
Potassium: 3.1 mmol/L — ABNORMAL LOW (ref 3.5–5.1)
Sodium: 133 mmol/L — ABNORMAL LOW (ref 135–145)
Sodium: 138 mmol/L (ref 135–145)

## 2024-06-21 LAB — CBC
HCT: 25.1 % — ABNORMAL LOW (ref 39.0–52.0)
Hemoglobin: 8.1 g/dL — ABNORMAL LOW (ref 13.0–17.0)
MCH: 30.8 pg (ref 26.0–34.0)
MCHC: 32.3 g/dL (ref 30.0–36.0)
MCV: 95.4 fL (ref 80.0–100.0)
Platelets: 154 K/uL (ref 150–400)
RBC: 2.63 MIL/uL — ABNORMAL LOW (ref 4.22–5.81)
RDW: 16 % — ABNORMAL HIGH (ref 11.5–15.5)
WBC: 8.1 K/uL (ref 4.0–10.5)
nRBC: 0 % (ref 0.0–0.2)

## 2024-06-21 LAB — COOXEMETRY PANEL
Carboxyhemoglobin: 1.4 % (ref 0.5–1.5)
Methemoglobin: 1.7 % — ABNORMAL HIGH (ref 0.0–1.5)
O2 Saturation: 79.4 %
Total hemoglobin: 9.5 g/dL — ABNORMAL LOW (ref 12.0–16.0)

## 2024-06-21 LAB — GLUCOSE, CAPILLARY
Glucose-Capillary: 203 mg/dL — ABNORMAL HIGH (ref 70–99)
Glucose-Capillary: 222 mg/dL — ABNORMAL HIGH (ref 70–99)
Glucose-Capillary: 223 mg/dL — ABNORMAL HIGH (ref 70–99)
Glucose-Capillary: 220 mg/dL — ABNORMAL HIGH (ref 70–99)
Glucose-Capillary: 183 mg/dL — ABNORMAL HIGH (ref 70–99)
Glucose-Capillary: 191 mg/dL — ABNORMAL HIGH (ref 70–99)

## 2024-06-21 LAB — MAGNESIUM: Magnesium: 2.4 mg/dL (ref 1.7–2.4)

## 2024-06-21 LAB — VANCOMYCIN, RANDOM: Vancomycin Rm: 16 ug/mL

## 2024-06-21 LAB — HEPARIN LEVEL (UNFRACTIONATED): Heparin Unfractionated: 0.34 [IU]/mL (ref 0.30–0.70)

## 2024-06-21 LAB — CG4 I-STAT (LACTIC ACID)
Lactic Acid, Venous: 0.5 mmol/L (ref 0.5–1.9)
Lactic Acid, Venous: 0.6 mmol/L (ref 0.5–1.9)

## 2024-06-21 MED ORDER — POTASSIUM CHLORIDE 20 MEQ PO PACK
40.0000 meq | PACK | ORAL | Status: DC
  Administered 2024-06-21: 40 meq
  Filled 2024-06-21: qty 2

## 2024-06-21 MED ORDER — AMIODARONE LOAD VIA INFUSION
150.0000 mg | Freq: Once | INTRAVENOUS | Status: AC
  Administered 2024-06-21: 150 mg via INTRAVENOUS
  Filled 2024-06-21: qty 83.34

## 2024-06-21 MED ORDER — POTASSIUM CHLORIDE 10 MEQ/50ML IV SOLN
INTRAVENOUS | Status: AC
  Administered 2024-06-21: 10 meq via INTRAVENOUS
  Filled 2024-06-21: qty 50

## 2024-06-21 MED ORDER — LIDOCAINE BOLUS VIA INFUSION
50.0000 mg | Freq: Once | INTRAVENOUS | Status: AC
  Administered 2024-06-21: 50 mg via INTRAVENOUS
  Filled 2024-06-21: qty 52

## 2024-06-21 MED ORDER — VANCOMYCIN HCL IN DEXTROSE 1-5 GM/200ML-% IV SOLN
1000.0000 mg | INTRAVENOUS | Status: DC
  Administered 2024-06-21 – 2024-06-22 (×2): 1000 mg via INTRAVENOUS
  Filled 2024-06-21 (×2): qty 200

## 2024-06-21 NOTE — Plan of Care (Signed)
   Problem: Nutrition: Goal: Adequate nutrition will be maintained Outcome: Progressing

## 2024-06-21 NOTE — Progress Notes (Signed)
 Patient ID: [name] [name], male   DOB: 1961-03-04, 63 y.o.   MRN: [id number]     Advanced Heart Failure Rounding Note  Cardiologist: Ezra Shuck, MD   Chief Complaint: Cardiogenic Shock  Subjective:    Remains intubated/sedated on multiple pressors. No major change  On broad spectrum abx.   Frequent VT on lidcoaine 0.5 and IV amio. Lido level 7  Respiratory culture grew Klebsiella and beta lactamase + Neisseria cinerea. On broad spectrum abx   Objective:    Weight Range: (!) 147.6 kg Body mass index is 39.61 kg/m.   Vital Signs:   Temp:  [98.6 F (37 C)-99.3 F (37.4 C)] 99.3 F (37.4 C) (10/04 2048) Pulse Rate:  [77-230] 83 (10/04 2048) Resp:  [13-34] 25 (10/04 2048) BP: (84-167)/(63-141) 120/72 (10/04 2025) SpO2:  [69 %-100 %] 84 % (10/04 2048) Arterial Line BP: (6-155)/(-10-75) 6/-10 (10/04 0630) FiO2 (%):  [100 %] 100 % (10/04 2025) Last BM Date :  (PTA)  Weight change: Filed Weights   06/18/24 0500 06/19/24 0500 06/20/24 0459  Weight: (!) 146.6 kg (!) 136.9 kg (!) 147.6 kg   Intake/Output:  Intake/Output Summary (Last 24 hours) at 06/21/2024 2055 Last data filed at 06/21/2024 2000 Gross per 24 hour  Intake 5620.64 ml  Output 5425 ml  Net 195.64 ml    Physical Exam    General:  Critically ill appearing on vent HEENT: +ETT Neck: RIJ swan + HD cath Cor: PMI nondisplaced. Regular rate & rhythm. No rubs, gallops or murmurs. Lungs: clear Abdomen: obese soft, nontender, + distended. Extremities: no cyanosis, clubbing, rash, 2+ edema Neuro: intubated/sedated  Telemetry   AV pacing with frequent VT  Labs    CBC Recent Labs    06/20/24 0430 06/21/24 0350 06/21/24 1106  WBC 11.2* 8.1  --   HGB 8.6* 8.1* 9.5*  HCT 26.5* 25.1* 28.0*  MCV 95.7 95.4  --   PLT 165 154  --    Basic Metabolic Panel Recent Labs    89/96/74 1821 06/21/24 0340 06/21/24 0350 06/21/24 1106 06/21/24 1323  NA  --  133*  --  134* 138  K 3.3* 3.8  --  3.6 3.1*  CL  --   96*  --   --  99  CO2  --  25  --   --  25  GLUCOSE  --  216*  --   --  223*  BUN  --  61*  --   --  61*  CREATININE  --  2.71*  --   --  2.79*  CALCIUM   --  8.3*  --   --  8.3*  MG 2.4  --  2.4  --   --   PHOS  --  4.7*  --   --  4.4   Liver Function Tests Recent Labs    06/21/24 0340 06/21/24 1323  ALBUMIN  2.2* 2.1*   BNP (last 3 results) Recent Labs    03/26/24 1413 04/28/24 1023 06/04/24 0204  BNP 326.3* 611.9* 219.1*   ProBNP (last 3 results) Recent Labs    06/13/24 1435  PROBNP 2,884.0*   Medications:    Scheduled Medications:  atorvastatin   80 mg Per Tube QPM   Chlorhexidine  Gluconate Cloth  6 each Topical Daily   docusate  100 mg Per Tube BID   famotidine  20 mg Per Tube Q0600   feeding supplement (VITAL 1.5 CAL)  480 mL Per Tube Q24H   folic acid   1 mg  Per Tube Daily   hydrocortisone sod succinate (SOLU-CORTEF) inj  100 mg Intravenous Q8H   insulin aspart  0-20 Units Subcutaneous Q4H   insulin glargine  5 Units Subcutaneous BID   mouth rinse  15 mL Mouth Rinse Q2H   pantoprazole  (PROTONIX ) IV  40 mg Intravenous Q24H   polyethylene glycol  17 g Per Tube BID   senna-docusate  2 tablet Per Tube QHS   thiamine   100 mg Per Tube Daily    Infusions:  amiodarone  60 mg/hr (06/21/24 1900)   dexmedetomidine (PRECEDEX) IV infusion 1.101 mcg/kg/hr (06/21/24 1900)   EPINEPHrine  (ADRENALIN ) 10 mg in sodium chloride  0.9 % 250 mL (0.04 mg/mL) infusion 3 mcg/min (06/21/24 1900)   fentaNYL  infusion INTRAVENOUS 300 mcg/hr (06/21/24 1900)   furosemide  (LASIX ) 200 mg in dextrose  5 % 100 mL (2 mg/mL) infusion 30 mg/hr (06/21/24 1900)   heparin  2,800 Units/hr (06/21/24 1900)   midazolam  4 mg/hr (06/21/24 1900)   norepinephrine (LEVOPHED) Adult infusion 8 mcg/min (06/21/24 1900)   piperacillin-tazobactam (ZOSYN)  IV 12.5 mL/hr at 06/21/24 1900   vancomycin  Stopped (06/21/24 0908)   vasopressin 0.04 Units/min (06/21/24 1900)    PRN Medications: acetaminophen  **OR**  acetaminophen , albuterol , butalbital -acetaminophen -caffeine , fentaNYL , midazolam , ondansetron  **OR** ondansetron  (ZOFRAN ) IV, mouth rinse, oxyCODONE   Assessment/Plan   1. 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 Minimal CAD on cath in July 2016 and again in 9/24. [employer] CRT-D device. ETOH vs HTN vs myocarditis as cause of cardiomyopathy. Echo 12/22 and again in 9/24 showed that EF remains 20-25%.  PYP scan not suggestive of TTR cardiac amyloidosis.  RHC 5/25 showed R>L heart failure with preserved cardiac output by Fick and thermo but low PAPi.  He historically has very frequent NSVT and VT.  Last echo in 8/25 showed EF 20-25%, normal RV function per report, mild MR.  He was admitted with low BP (baseline SBP 90s) and marked volume overload.  Started on peripheral norepinephrine. RHC showed markedly elevated biventricular filling pressures but cardiac index normal by Fick and thermodilution and lactate normal. PAPi was low.   Milrinone begun for RV support. Persistent hypotension/vasoplegia. Now off milrinone  - On NE 8, VP 0.04 Epi at 3. Titrate as needed - He is making little to no progress due to a combination of septic shock on top of end-stage HF.  - Long talk with family with CCM regarding GOC - They do not want him to suffer and woud not want to prolong his course unnecessarily but want to give him a chance - He remains DNR/no shocks - If no improvement by Monday would consider transition to comfort care. He would not want trach  2. Acute Hypoxic Respiratory Failure: due to pulmonary edema.   - Remains intubated on 80%  - vent mgmt per CCM - attempting diuresis - sputum growing klebsiella and neisseria cinerea. Continue abx  3. Paroxysmal atrial fibrillation:  - AF noted 9/30 on live device interogation, but predominantly VT - predominately AV pacing - Amiodarone  gtt increased to 60/hr (see below) - Continue heparin  gtt  4. VT: Had VT  ablation at [facility] in 4/25 but still with frequent NSVT and VT.  Had VT with shock earlier this month. He has been referred to EP at [facility] for additional evaluation.  - developed frequent long runs of NSVT 9/30 with hemodynamic compromise - Having frequent VT/NSVT despite ai and lido. Lid level 7. Stop lido - Family would not want further shock. ICD  is off - Keep K > 4.0 Mg > 2.0  5. AKI on CKD stage 3: Creatinine up to 4.47, baseline recently has been around 2.2-2.3.  Suspect cardiorenal with markedly elevated right-sided filling pressures. - CRRT started 9/28-9/29, after decompensation 9/29 family required CRRT be stopped.  - sCr 2.9 >2.15>2.77>2.73-> 2.79 - making urine but net + with IV intake - Family not wanting furterh CRRT  6. Anemia: s/p IV Fe  - Keep hgb > 7.5  7. OSA: intubated.  8. ETOH abuse: Suspect this contributes to cardiomyopathy.    Code Status: DNR - No CPR or shocks  -ICD deactivated 10/2 after multiple shocks -Ongoing GOC discussions with family  CRITICAL CARE Performed by: Toribio Fuel   Total critical care time: 45 minutes  Critical care time was exclusive of separately billable procedures and treating other patients.  Critical care was necessary to treat or prevent imminent or life-threatening deterioration.  Critical care was time spent personally by me on the following activities: development of treatment plan with patient and/or surrogate as well as nursing, discussions with consultants, evaluation of patient's response to treatment, examination of patient, obtaining history from patient or surrogate, ordering and performing treatments and interventions, ordering and review of laboratory studies, ordering and review of radiographic studies, pulse oximetry and re-evaluation of patient's condition.   Length of Stay: 8  Toribio Fuel, MD  06/21/2024, 8:55 PM  Advanced Heart Failure Team Pager [phone] (M-F; 7a - 5p)  Please contact [facility]  for night-coverage after hours (5p -7a ) and weekends on [email or web address]

## 2024-06-21 NOTE — Progress Notes (Signed)
 NAMEDereck [name], MRN:  [id number], DOB:  11-04-1960, LOS: 8 ADMISSION DATE:  06/13/2024, CONSULTATION DATE:  06/14/2024 REFERRING MD:  TRH, CHIEF COMPLAINT:  Cardiogenic shock    History of Present Illness:  [name] [name] is a 63 y.o. male with a PMH significant for HFrEF, severely dilated LV, nonischemic cardiomyopathy, recurrent/sustained V. tach requiring episodic ICD shocks, presence of biventricular ICD, paroxysmal atrial fibrillation, CKD stage IIIb, OSA on BiPAP, HTN, anxiety, and depression who presented to the ED at [facility] 9/26 for complaints of shortness of breath and nausea. Workup on admission revealed decompensated CHF with concerns to progression of cardiogenic shock prompting PCCM and heart failure consult.   Pertinent  Medical History  HFrEF, severely dilated LV, nonischemic cardiomyopathy, recurrent/sustained V. tach requiring episodic ICD shocks, presence of biventricular ICD, paroxysmal atrial fibrillation, CKD stage IIIb, OSA on BiPAP, HTN, anxiety, and depression   Significant Hospital Events: Including procedures, antibiotic start and stop dates in addition to other pertinent events   9/26 presented to [name] [name], ED for complaints of shortness of breath, weight gain, and nausea  9/27 required initiation of vasopressor support for persistent hypotension in the setting of evolving cardiogenic shock. PCCM and HF consulted  9/30 worsening tachypnea and dyspnea requiring intubation, increased norepi support  10/1 rapidly decompensated yesterday requiring max pressors, lidocaine , amiodarone , INO, steroids and methylene blue  10/2 pressor requirement stabilizing, worsening volume overload and increased FiO2 on vent, episode of VT and shocked by ICD several times  10/3 ICD turned off yesterday per family request, but had continue wanting further aggressive therapy.   Interim History / Subjective:  Ongoing high pressor and vent needs Continued kussmaul breathing  pattern  Objective    Blood pressure (!) 144/95, pulse (!) 119, temperature 99 F (37.2 C), resp. rate (!) 28, height 6' 4 (1.93 m), weight (!) 147.6 kg, SpO2 93%. PAP: (32-59)/(24-57) 43/32 CVP:  [17 mmHg] 17 mmHg CO:  [6.8 L/min] 6.8 L/min CI:  [2.47 L/min/m2] 2.47 L/min/m2  Vent Mode: PRVC FiO2 (%):  [70 %-100 %] 100 % Set Rate:  [28 bmp] 28 bmp Vt Set:  [640 mL-690 mL] 690 mL PEEP:  [8 cmH20] 8 cmH20 Plateau Pressure:  [23 cmH20-30 cmH20] 30 cmH20   Intake/Output Summary (Last 24 hours) at 06/21/2024 1431 Last data filed at 06/21/2024 1200 Gross per 24 hour  Intake 5126.74 ml  Output 5825 ml  Net -698.26 ml   Filed Weights   06/18/24 0500 06/19/24 0500 06/20/24 0459  Weight: (!) 146.6 kg (!) 136.9 kg (!) 147.6 kg   Ill appearing +accessory muscle use CI is preserved, ext warm Lungs some rhonci Heart irregular ongoing runs of VT RASS -4  Labs/imaging reviewed  Resolved problem list  Hyperkalemia  Assessment and Plan  Ongoing shock state- initial cardiac now mixed picture currently complete vent/CRRT dependent.  We are now over a week in acute on chronic multiorgan failure without improvement despite renal replacement therapy, inotropes, mechanical ventilation, and broad spectrum abx.  I worry at this point we are prolonging the dying process.  - Continue CRRT, vent bundle, broad spectrum abx - Long talks with family by PCCM and AHF regarding how we are nearing futility.  Will give them time to grieve but Monday I think we need to switch to comfort care.  Likewise if any further deterioration we may need to move up this timeline.  Appreciate everyone's help with this tough case.  33 min cc time  [name] Sharps  MD PCCM

## 2024-06-21 NOTE — Progress Notes (Signed)
 Pharmacy Antibiotic Note  [name] [name] is a 63 y.o. male admitted on 06/13/2024 with sepsis.  Pharmacy has been consulted for vancomycin  dosing. Of note, the patient was on CRRT from 9/29 until it was withdrawn per family's wishes on 9/30. Based on renal function of 3.09 (while on CRRT), wt of 146.8 kg and Vd 0.5, next dose after load would be due in 48 hours (morning of 10/2). Patient also has not had any UOP on 9/30.  06/21/2024: Patient remains off CRRT with good UOP. WBC down to 8.1, afebrile. Scr stable at 2.71 mg/dL today. Vancomycin  random level checked this morning was 16 mcg/mL, ~19 hours after vancomycin  1000 mg IV x1. Will schedule 1000 mg every 24 hours and plan to recheck vancomycin  random level in 48 hours. Respiratory culture with klebsiella pneumoniae and neisseria cinerea, CCM team aware.   Plan: Vancomycin  1000 mg IV every 24 hours Zosyn 3.375 gm IV every 8 hours Recheck vancomycin  random level in 48 hours Monitor cultures, clinical status, renal function, vancomycin  level Narrow abx as able and f/u duration    Height: 6' 4 (193 cm) Weight: (!) 147.6 kg (325 lb 6.4 oz) IBW/kg (Calculated) : 86.8  Temp (24hrs), Avg:98.9 F (37.2 C), Min:98.6 F (37 C), Max:99.3 F (37.4 C)  Recent Labs  Lab 06/17/24 1105 06/17/24 1342 06/17/24 1344 06/17/24 1658 06/18/24 0430 06/18/24 1336 06/18/24 2220 06/19/24 0353 06/19/24 0432 06/19/24 1331 06/20/24 0430 06/20/24 1400 06/21/24 0340 06/21/24 0350  WBC  --    < >  --   --  9.9  --  7.9 9.5  --   --  11.2*  --   --  8.1  CREATININE  --   --   --    < > 2.93*   < >  --  2.76*  --  2.57*  2.65* 2.73* 2.89* 2.71*  --   LATICACIDVEN 0.7  --  0.6  --  0.6  --   --   --  0.7  --  0.6  --   --   --   VANCORANDOM  --   --   --   --   --    < >  --  10  --   --  14  --  16  --    < > = values in this interval not displayed.    Estimated Creatinine Clearance: 45 mL/min (A) (by C-G formula based on SCr of 2.71 mg/dL (H)).     Allergies  Allergen Reactions   Nsaids Other (See Comments)    Contraindication due to CKD III   Sglt2 Inhibitors Other (See Comments)    Recurrent UTI    Antimicrobials this admission: none  Microbiology results: 10/1: klebsiella pneumoniae and neisseria cinerea 9/26: ngtd x5 days 9/26 BCx: pending 9/27 MRSA PCR: not detected  Thank you for allowing pharmacy to be a part of this patient's care.  Morna Breach, PharmD PGY2 Cardiology Pharmacy Resident 06/21/2024 6:52 AM

## 2024-06-21 NOTE — Progress Notes (Signed)
 PHARMACY - ANTICOAGULATION CONSULT NOTE  Pharmacy Consult for heparin  Indication: atrial fibrillation  Allergies  Allergen Reactions   Nsaids Other (See Comments)    Contraindication due to CKD III   Sglt2 Inhibitors Other (See Comments)    Recurrent UTI    Patient Measurements: Height: 6' 4 (193 cm) Weight: (!) 147.6 kg (325 lb 6.4 oz) IBW/kg (Calculated) : 86.8 HEPARIN  DW (KG): 122.2  Vital Signs: Temp: 99.1 F (37.3 C) (10/04 0532) BP: 138/82 (10/04 0500) Pulse Rate: 187 (10/04 0515)  Labs: Recent Labs    06/19/24 0353 06/19/24 0434 06/19/24 1331 06/20/24 0430 06/20/24 1400 06/21/24 0340 06/21/24 0350  HGB 8.1* 8.5*  --  8.6*  --   --  8.1*  HCT 24.7* 25.0*  --  26.5*  --   --  25.1*  PLT 159  --   --  165  --   --  154  HEPARINUNFRC 0.43  --   --  0.31  --  0.34  --   CREATININE 2.76*  --    < > 2.73* 2.89* 2.71*  --    < > = values in this interval not displayed.    Estimated Creatinine Clearance: 45 mL/min (A) (by C-G formula based on SCr of 2.71 mg/dL (H)).   Medical History: Past Medical History:  Diagnosis Date   AICD (automatic cardioverter/defibrillator) present    Arthritis    Atrial fibrillation (HCC)    Chronic HFrEF (heart failure with reduced ejection fraction) (HCC)    Chronic systolic heart failure (HCC)    Nonischemic CM: echo 4/12 with EF 25-30%, grade 2 diast dysfxn, mild dilated aortic root 43 mm, trivial MR, mod LAE   COPD (chronic obstructive pulmonary disease) (HCC)    Dyspnea    with exertion    H/O ETOH abuse    HTN (hypertension)    Hypersomnia    Mild CAD    Nonischemic cardiomyopathy (HCC)    Obesity, morbid (HCC)    OSA on CPAP    Tobacco use disorder    Ventricular tachycardia (HCC)    Assessment: 38 yoM admitted with cardiogenic shock. Pt on rivaroxaban  PTA for hx AF. Pt in renal failure and may potentially need MCS so will hold DOAC and use IV heparin . LD rivaroxaban  9/25.  06/21/24: Heparin  level 0.34,  therapeutic on heparin  2800 units/hr. No issues with infusion running or signs of bleeding noted. CBC stable (Hgb 8.1, PLT 154).   Goal of Therapy:  Heparin  level 0.3-0.7 units/ml Monitor platelets by anticoagulation protocol: Yes   Plan:  Continue heparin  infusion at 2800 units/hr Monitor daily heparin  level, CBC, and for s/sx of bleeding   Thank you for allowing pharmacy to participate in this patient's care,  Morna Breach, PharmD PGY2 Cardiology Pharmacy Resident 06/21/2024 6:53 AM  [facility] pharmacy phone numbers are listed on [email or web address]

## 2024-06-22 ENCOUNTER — Inpatient Hospital Stay (HOSPITAL_COMMUNITY)

## 2024-06-22 DIAGNOSIS — I5082 Biventricular heart failure: Secondary | ICD-10-CM | POA: Diagnosis not present

## 2024-06-22 DIAGNOSIS — R570 Cardiogenic shock: Secondary | ICD-10-CM | POA: Diagnosis not present

## 2024-06-22 DIAGNOSIS — I5084 End stage heart failure: Secondary | ICD-10-CM | POA: Diagnosis not present

## 2024-06-22 DIAGNOSIS — I480 Paroxysmal atrial fibrillation: Secondary | ICD-10-CM | POA: Diagnosis not present

## 2024-06-22 LAB — HEPARIN LEVEL (UNFRACTIONATED): Heparin Unfractionated: 0.37 [IU]/mL (ref 0.30–0.70)

## 2024-06-22 LAB — CBC
HCT: 26.5 % — ABNORMAL LOW (ref 39.0–52.0)
Hemoglobin: 8.5 g/dL — ABNORMAL LOW (ref 13.0–17.0)
MCH: 30.7 pg (ref 26.0–34.0)
MCHC: 32.1 g/dL (ref 30.0–36.0)
MCV: 95.7 fL (ref 80.0–100.0)
Platelets: 154 K/uL (ref 150–400)
RBC: 2.77 MIL/uL — ABNORMAL LOW (ref 4.22–5.81)
RDW: 16.4 % — ABNORMAL HIGH (ref 11.5–15.5)
WBC: 9.9 K/uL (ref 4.0–10.5)
nRBC: 0 % (ref 0.0–0.2)

## 2024-06-22 LAB — RENAL FUNCTION PANEL
Albumin: 2 g/dL — ABNORMAL LOW (ref 3.5–5.0)
Anion gap: 13 (ref 5–15)
BUN: 67 mg/dL — ABNORMAL HIGH (ref 8–23)
CO2: 25 mmol/L (ref 22–32)
Calcium: 8.3 mg/dL — ABNORMAL LOW (ref 8.9–10.3)
Chloride: 99 mmol/L (ref 98–111)
Creatinine, Ser: 2.91 mg/dL — ABNORMAL HIGH (ref 0.61–1.24)
GFR, Estimated: 24 mL/min — ABNORMAL LOW (ref >60)
Glucose, Bld: 216 mg/dL — ABNORMAL HIGH (ref 70–99)
Phosphorus: 4.6 mg/dL (ref 2.5–4.6)
Potassium: 2.9 mmol/L — ABNORMAL LOW (ref 3.5–5.1)
Sodium: 137 mmol/L (ref 135–145)

## 2024-06-22 LAB — GLUCOSE, CAPILLARY
Glucose-Capillary: 198 mg/dL — ABNORMAL HIGH (ref 70–99)
Glucose-Capillary: 181 mg/dL — ABNORMAL HIGH (ref 70–99)
Glucose-Capillary: 215 mg/dL — ABNORMAL HIGH (ref 70–99)

## 2024-06-22 LAB — MAGNESIUM: Magnesium: 2.1 mg/dL (ref 1.7–2.4)

## 2024-06-22 MED ORDER — FENTANYL 2500MCG IN NS 250ML (10MCG/ML) PREMIX INFUSION
0.0000 ug/h | INTRAVENOUS | Status: DC
  Administered 2024-06-22: 300 ug/h via INTRAVENOUS
  Filled 2024-06-22: qty 250

## 2024-06-22 MED ORDER — MIDAZOLAM BOLUS VIA INFUSION
1.0000 mg | INTRAVENOUS | Status: DC | PRN
  Administered 2024-06-22: 2 mg via INTRAVENOUS

## 2024-06-22 MED ORDER — ACETAMINOPHEN 650 MG RE SUPP: 650.0000 mg | Freq: Four times a day (QID) | RECTAL | Status: DC | PRN

## 2024-06-22 MED ORDER — MIDAZOLAM-SODIUM CHLORIDE 100-0.9 MG/100ML-% IV SOLN: 0.0000 mg/h | INTRAVENOUS | Status: DC

## 2024-06-22 MED ORDER — POLYVINYL ALCOHOL 1.4 % OP SOLN: 1.0000 [drp] | Freq: Four times a day (QID) | OPHTHALMIC | Status: DC | PRN

## 2024-06-22 MED ORDER — ORAL CARE MOUTH RINSE: 15.0000 mL | OROMUCOSAL | Status: DC | PRN

## 2024-06-22 MED ORDER — PROPOFOL 1000 MG/100ML IV EMUL
0.0000 ug/kg/min | INTRAVENOUS | Status: DC
  Administered 2024-06-22 (×2): 60 ug/kg/min via INTRAVENOUS
  Administered 2024-06-22: 30 ug/kg/min via INTRAVENOUS
  Filled 2024-06-22 (×3): qty 100

## 2024-06-22 MED ORDER — GLYCOPYRROLATE 0.2 MG/ML IJ SOLN: 0.2000 mg | INTRAMUSCULAR | Status: DC | PRN

## 2024-06-22 MED ORDER — AMIODARONE LOAD VIA INFUSION: 150.0000 mg | Freq: Once | INTRAVENOUS | Status: DC

## 2024-06-22 MED ORDER — FENTANYL BOLUS VIA INFUSION
100.0000 ug | INTRAVENOUS | Status: DC | PRN
  Administered 2024-06-22 (×2): 100 ug via INTRAVENOUS

## 2024-06-22 MED ORDER — PANTOPRAZOLE SODIUM 40 MG IV SOLR: 40.0000 mg | INTRAVENOUS | Status: DC

## 2024-06-22 MED ORDER — ORAL CARE MOUTH RINSE
15.0000 mL | OROMUCOSAL | Status: DC
  Administered 2024-06-22 (×2): 15 mL via OROMUCOSAL

## 2024-06-22 MED ORDER — GLYCOPYRROLATE 1 MG PO TABS: 1.0000 mg | ORAL_TABLET | ORAL | Status: DC | PRN

## 2024-06-22 MED ORDER — AMIODARONE LOAD VIA INFUSION
150.0000 mg | Freq: Once | INTRAVENOUS | Status: AC
  Administered 2024-06-22: 150 mg via INTRAVENOUS

## 2024-06-22 MED ORDER — DIPHENHYDRAMINE HCL 50 MG/ML IJ SOLN: 25.0000 mg | INTRAMUSCULAR | Status: DC | PRN

## 2024-06-22 MED ORDER — MIDAZOLAM BOLUS VIA INFUSION (WITHDRAWAL LIFE SUSTAINING TX)
2.0000 mg | INTRAVENOUS | Status: DC | PRN
  Administered 2024-06-22 (×2): 2 mg via INTRAVENOUS

## 2024-06-22 MED ORDER — POTASSIUM CHLORIDE 10 MEQ/50ML IV SOLN
10.0000 meq | INTRAVENOUS | Status: AC
  Administered 2024-06-22 (×6): 10 meq via INTRAVENOUS
  Filled 2024-06-22 (×6): qty 50

## 2024-06-22 MED ORDER — SODIUM CHLORIDE 0.9 % IV SOLN: INTRAVENOUS | Status: DC

## 2024-06-22 MED ORDER — GERHARDT'S BUTT CREAM
TOPICAL_CREAM | Freq: Every day | CUTANEOUS | Status: DC | PRN
  Filled 2024-06-22: qty 60

## 2024-06-22 MED ORDER — ACETAMINOPHEN 325 MG PO TABS: 650.0000 mg | ORAL_TABLET | Freq: Four times a day (QID) | ORAL | Status: DC | PRN

## 2024-06-22 MED ORDER — MIDAZOLAM HCL 2 MG/2ML IJ SOLN: 1.0000 mg | INTRAMUSCULAR | Status: DC | PRN

## 2024-06-22 MED ORDER — AMIODARONE IV BOLUS ONLY 150 MG/100ML
150.0000 mg | Freq: Once | INTRAVENOUS | Status: AC
  Administered 2024-06-22: 150 mg via INTRAVENOUS

## 2024-06-26 ENCOUNTER — Other Ambulatory Visit (HOSPITAL_COMMUNITY): Payer: Self-pay | Admitting: Cardiology

## 2024-07-07 ENCOUNTER — Encounter

## 2024-07-10 LAB — LIDOCAINE LEVEL: Lidocaine Lvl: 7.1 ug/mL — ABNORMAL HIGH (ref 1.5–5.0)

## 2024-07-10 NOTE — Progress Notes (Signed)
 Sent to Primary Care.

## 2024-07-19 NOTE — Death Summary Note (Signed)
 DEATH SUMMARY   Patient Details  Name: [name] [name] MRN: [id number] DOB: Oct 21, 1960  Admission/Discharge Information   Admit Date:  2024/06/25  Date of Death: Date of Death: July 04, 2024  Time of Death: Time of Death: 07-12-1832  Length of Stay: 07-08-2024  Referring Physician: Lorren Greig PARAS, NP    Diagnoses  Preliminary cause of death: Nonischemic cardiomyopathy x 10 years Secondary Diagnoses (including complications and co-morbidities):  Septic shock secondary to klebsiella pneumonia Acute renal failure Recurrent Vtach Acute hypoxemic respiratory failure secondary to pulmonary edema and pneumonia DM2 with hyperglycemia OSA Anemia  Brief Hospital Course (including significant findings, care, treatment, and services provided and events leading to death)  [name] [name] is a 63 y.o. male with a PMH significant for HFrEF, severely dilated LV, nonischemic cardiomyopathy, recurrent/sustained V. tach requiring episodic ICD shocks, presence of biventricular ICD, paroxysmal atrial fibrillation, CKD stage IIIb, OSA on BiPAP, HTN, anxiety, and depression who presented to the ED at [facility] 06/25/24 for complaints of shortness of breath and nausea. Workup on admission revealed decompensated CHF with concerns to progression of cardiogenic shock prompting PCCM and heart failure consult.   Patient started on inotropes and diuretics but unfortunately little progress so CRRT started.  On 9/30 respiratory status deteriorated and he was intubated.   10/1 cardiogenic shock became multifactorial as he went into profound distributive shock on top thought to be related to HCAP with klebsiella in tracheal aspirate.  Despite aggressive care he continued to decline and ultimately family transitioned him to comfort.  He passed peacefully off ventilator with family at bedside.   Pertinent Labs and Studies  Significant Diagnostic Studies DG CHEST PORT 1 VIEW Result Date: 06/21/2024 CLINICAL DATA:  Respiratory distress. EXAM:  PORTABLE CHEST 1 VIEW COMPARISON:  06/19/2024 FINDINGS: 0618 hours. Endotracheal tube tip is approximately 3.3 cm above the base of the carina. NG tube is obscured over the lower mediastinum. Right IJ central line tip remains in place near the innominate vein confluence. Left IJ central line evident although the distal tip is obscured. Pulmonary artery catheter seen previously overlying the main pulmonary artery is not clearly visible on this study. Left-sided permanent pacemaker again noted. The cardio pericardial silhouette is enlarged. Vascular congestion with diffuse interstitial opacity suggests edema. Bibasilar atelectasis/infiltrate with probable pleural effusions. IMPRESSION: 1. Endotracheal tube tip is approximately 3.3 cm above the base of the carina. 2. NG tube is obscured over the lower mediastinum. Left IJ central line not clearly demonstrated on the study. 3. Vascular congestion with diffuse interstitial opacity suggesting edema. Electronically Signed   By: Camellia Candle M.D.   On: 06/21/2024 06:58   DG Abd 1 View Result Date: 06/19/2024 EXAM: 1 VIEW XRAY OF THE ABDOMEN 06/19/2024 09:47:00 AM COMPARISON: 06/17/2024 CLINICAL HISTORY: Abdomen enlarged. FINDINGS: LINES, TUBES AND DEVICES: Enteric tube in stomach. AICD noted. BOWEL: Portal prominence of gas in the transverse colon although not in the distal colon. Nonobstructive bowel gas pattern. SOFT TISSUES: Vascular calcifications noted. No opaque urinary calculi. BONES: Right total hip prosthesis. Moderate to severe degenerative arthropathy of the left hip. Lumbar spondylosis. No acute osseous abnormality. IMPRESSION: 1. No acute abdominal abnormality. 2. Moderate to severe degenerative arthropathy of the left hip. 3. Lumbar spondylosis. Electronically signed by: Ryan Salvage MD 06/19/2024 10:25 AM EDT RP Workstation: [id number]   DG CHEST PORT 1 VIEW Result Date: 06/19/2024 EXAM: 1 VIEW(S) XRAY OF THE CHEST 06/19/2024 05:19:00 AM  COMPARISON: 06/17/2024 CLINICAL HISTORY: encounter for central line placement FINDINGS: LINES, TUBES AND  DEVICES: Right internal jugular central venous catheter in place with tip overlying mid superior vena cava. Left internal jugular Swan-Ganz catheter in place with tip overlying main pulmonary artery. Enteric tube in place with tip obscured by positioning. Endotracheal tube in place with tip 6.7 cm above carina. Stable left chest wall pacemaker. LUNGS AND PLEURA: Diffuse left lung opacification, worsened from prior. Moderate left pleural effusion. Moderate pulmonary edema. No pneumothorax. HEART AND MEDIASTINUM: Left heart border obscured with unchanged marked cardiomegaly. BONES AND SOFT TISSUES: No acute osseous abnormality. IMPRESSION: 1. Worsening diffuse left lung opacification, likely reflecting progression of air-space disease. 2. Moderate left pleural effusion. 3. Moderate pulmonary edema. 4. Support devices in expected positions, including endotracheal tube, enteric tube (tip not visualized), right IJ central venous catheter (tip in mid SVC), and left IJ Swan-Ganz catheter (tip in main pulmonary artery). Stable left chest wall pacemaker. 5. Marked cardiomegaly, unchanged. Electronically signed by: Evalene Coho MD 06/19/2024 05:44 AM EDT RP Workstation: [id number]   DG Abd 1 View Result Date: 06/17/2024 EXAM: 1 VIEW XRAY OF THE ABDOMEN 06/17/2024 07:00:00 PM COMPARISON: 06/17/2024 CLINICAL HISTORY: Encounter for orogastric (OG) tube placement FINDINGS: LINES, TUBES AND DEVICES: Enteric tube in place with tip and side hole overlying the left upper quadrant gastric bubble. BOWEL: Multiple Gas-filled loops of bowel. Gaseous distention of the stomach. SOFT TISSUES: No opaque urinary calculi. BONES: No acute osseous abnormality. IMPRESSION: 1. Enteric tube appropriately positioned with tip and side hole in the gastric bubble. Electronically signed by: Norman Gatlin MD 06/17/2024 08:51 PM EDT RP  Workstation: [id number]   DG Abd Portable 1V Result Date: 06/17/2024 EXAM: 1 VIEW XRAY OF THE ABDOMEN 06/17/2024 06:44:00 PM COMPARISON: 06/17/2024 CLINICAL HISTORY: Gastrointestinal tube present (HCC) FINDINGS: LINES, TUBES AND DEVICES: Enteric decompression tube in place with tip likely in the stomach and sideport in the esophagus. BOWEL: Gaseous dilation of the stomach and colon. SOFT TISSUES: No opaque urinary calculi. BONES: No acute osseous abnormality. IMPRESSION: 1. Enteric decompression tube in place with tip likely in the stomach and sideport in the esophagus. Electronically signed by: Norman Gatlin MD 06/17/2024 08:50 PM EDT RP Workstation: [id number]   DG Abd 1 View Result Date: 06/17/2024 CLINICAL DATA:  Enteric tube placement. EXAM: ABDOMEN - 1 VIEW COMPARISON:  Chest x-ray 06/17/2024 FINDINGS: Visualized bowel gas pattern is nonobstructive. Enteric tube can be followed to the level of the diaphragm and is difficult to visualize beyond this point due to abundant overlying soft tissues and slight underpenetration. Remainder of the visualized lung bases are unchanged. IMPRESSION: 1. Nonobstructive bowel gas pattern. 2. Enteric tube can be followed to the level of the diaphragm and is difficult to visualize beyond this point due to abundant overlying soft tissues and slight underpenetration. Electronically Signed   By: Toribio Agreste M.D.   On: 06/17/2024 10:39   DG CHEST PORT 1 VIEW Result Date: 06/17/2024 CLINICAL DATA:  Respiratory failure. EXAM: PORTABLE CHEST 1 VIEW COMPARISON:  06/17/2024 at 9:23 a.m. FINDINGS: Right IJ central venous catheter unchanged. Endotracheal tube with tip approximately 3 cm above the carina. Left IJ Swan-Ganz catheter with tip over the right main pulmonary artery right of midline. Enteric tube can be followed down to the level of the mid to distal esophagus as tip is not visualized. Left-sided pacemaker unchanged. Lungs are hypoinflated demonstrate continued  bibasilar opacification likely small effusions with associated bibasilar atelectasis. Hazy perihilar vessels suggesting a degree of vascular congestion. Stable cardiomegaly. Remainder of the exam is unchanged. IMPRESSION: 1. Hypoinflation  with persistent bibasilar opacification likely small effusions with associated bibasilar atelectasis. 2. Stable cardiomegaly with suggestion of mild vascular congestion. 3. Tubes and lines as described. Enteric tube can be followed down to the level of the mid to distal esophagus as tip is not visualized. Consider high KUB for further assessment of enteric tube tip. Electronically Signed   By: Toribio Agreste M.D.   On: 06/17/2024 10:33   DG CHEST PORT 1 VIEW Result Date: 06/17/2024 CLINICAL DATA:  [id number] Pulmonary edema [id number] EXAM: PORTABLE CHEST - 1 VIEW COMPARISON:  June 17, 2024 8:16 a.m. FINDINGS: Right IJ approach central venous catheter terminates at the brachiocephalic vein confluence, unchanged. Left IJ approach central venous catheter appears similarly positioned, but the tip is not well visualized esophagogastric tube has reportedly been placed in the interim, but is not well visualized or evaluated. Similar interstitial opacities throughout both lungs. Patchy opacities in both lung bases. No pleural effusion or pneumothorax. Severe cardiomegaly. Left chest pacemaker/AICD with leads terminating in the right atrium and right ventricle. IMPRESSION: 1. No significant interval change to the lungs. 2. Esophagogastric tube has reportedly in place in the interim, but is not well visualized or evaluated due to patient's body habitus and portable technique. Repeat two-view chest radiograph or chest CT recommended. Electronically Signed   By: Rogelia Myers M.D.   On: 06/17/2024 09:56   DG CHEST PORT 1 VIEW Result Date: 06/17/2024 CLINICAL DATA:  Dyspnea EXAM: PORTABLE CHEST 1 VIEW COMPARISON:  Chest x-ray performed June 16, 2024 FINDINGS: A left-sided chest wall  implant is present with multiple leads directed towards the heart. Left-sided approach central venous catheter is present. The tip is obscured secondary to overlying structures. A right-sided approach central catheter is present with tip overlying the SVC. Heart is enlarged. Retrocardiac region is obscured. Interstitial airspace opacities are present which are grossly similar. IMPRESSION: 1. No significant interval change. Electronically Signed   By: Maude Naegeli M.D.   On: 06/17/2024 08:39   DG Chest Port 1 View Result Date: 06/16/2024 CLINICAL DATA:  [id number] Encounter for central line placement [id number] EXAM: PORTABLE CHEST - 1 VIEW COMPARISON:  06/15/2024 FINDINGS: Right IJ approach central venous catheter has been placed in the interim, with the distal tip not well visualized. Left IJ approach pulmonary artery catheter is similarly positioned terminating in the right hilum. Similar interstitial opacities throughout both lungs. Patchy opacities in both lung bases. Trace bilateral pleural effusions. Moderate cardiomegaly. Left chest pacemaker/AICD with leads terminating in the right atrium and right ventricle. IMPRESSION: 1. Interval placement of a right IJ approach central venous catheter, with the distal tip not well visualized due to overlapping pacemaker leads in the pulmonary artery catheter. No pneumothorax. 2. Little significant interval change to the lungs. Trace bilateral pleural effusions. Electronically Signed   By: Rogelia Myers M.D.   On: 06/16/2024 11:36   DG Chest Port 1 View Result Date: 06/15/2024 CLINICAL DATA:  Congestive heart failure. EXAM: PORTABLE CHEST 1 VIEW COMPARISON:  06/13/2024 FINDINGS: Small Ganz catheter from left internal jugular approach with tip in the region of the main pulmonary outflow tract. Left-sided pacemaker remains in place. Cardiomegaly is similar. Pulmonary edema, with mild progression. Questionable small pleural effusions. No visible pneumothorax. IMPRESSION: 1.  Strong Ganz catheter tip in the region of the main pulmonary outflow tract. 2. Cardiomegaly with pulmonary edema, mild progression from yesterday. 3. Questionable small pleural effusions. Electronically Signed   By: Andrea Gasman M.D.   On: 06/15/2024 10:23  CARDIAC CATHETERIZATION Result Date: 06/14/2024 1. Severely elevated biventricular filling pressures. 2. Cardiac output preserved by Fick and thermodilution. 3. PAPi low suggesting RV dysfunction. 4. Moderate pulmonary venous hypertension   DG Chest Portable 1 View Result Date: 06/13/2024 EXAM: 1 VIEW(S) XRAY OF THE CHEST 06/13/2024 03:32:00 PM COMPARISON: 06/04/2024 CLINICAL HISTORY: SOB and hypotension. FINDINGS: LINES, TUBES AND DEVICES: AICD noted, ventricular lead tip is obscured by underpenetration. LUNGS AND PLEURA: Low lung volumes are present, causing crowding of the pulmonary vasculature. No focal pulmonary opacity. No pulmonary edema. No pleural effusion. No pneumothorax. No consolidation. HEART AND MEDIASTINUM: Moderate cardiomegaly. Cardiomegaly and mediastinal contours stable. BONES AND SOFT TISSUES: No acute osseous abnormality. LIMITATIONS/ARTIFACTS: The frontal projection is rotated to the right, reducing diagnostic sensitivity and specificity. IMPRESSION: 1. No acute cardiopulmonary abnormality. 2. Moderate cardiomegaly, stable. 3. AICD in place; ventricular lead tip not well visualized due to underpenetration. 4. Low lung volumes. Electronically signed by: Ryan Salvage MD 06/13/2024 04:01 PM EDT RP Workstation: [id number]   DG Chest 2 View Result Date: 06/04/2024 CLINICAL DATA:  Chest pain EXAM: CHEST - 2 VIEW COMPARISON:  04/17/2020 FINDINGS: Cardiac shadow is enlarged. Defibrillator is again noted and stable. Lungs are well aerated bilaterally. Calcified pleural plaques are noted on the right. Chronic interstitial changes are again identified. No focal infiltrate is seen. No bony abnormality is noted. IMPRESSION: No active  cardiopulmonary disease. Electronically Signed   By: Oneil Devonshire M.D.   On: 06/04/2024 02:19    Microbiology Recent Results (from the past 240 hours)  Blood culture (routine x 2)     Status: None   Collection Time: 06/13/24  3:15 PM   Specimen: BLOOD  Result Value Ref Range Status   Specimen Description   Final    BLOOD SITE NOT SPECIFIED Performed at [facility], [address], [place], [place] [address]    Special Requests   Final    BOTTLES DRAWN AEROBIC ONLY Blood Culture results may not be optimal due to an inadequate volume of blood received in culture bottles Performed at [facility], [address], [place], [place] [address]    Culture   Final    NO GROWTH 5 DAYS Performed at [facility], [address] [place], [place] [address]    Report Status 06/18/2024 FINAL  Final  Resp panel by RT-PCR (RSV, Flu A&B, Covid) Anterior Nasal Swab     Status: None   Collection Time: 06/13/24  3:17 PM   Specimen: Anterior Nasal Swab  Result Value Ref Range Status   SARS Coronavirus 2 by RT PCR NEGATIVE NEGATIVE Final    Comment: (NOTE) SARS-CoV-2 target nucleic acids are NOT DETECTED.  The SARS-CoV-2 RNA is generally detectable in upper respiratory specimens during the acute phase of infection. The lowest concentration of SARS-CoV-2 viral copies this assay can detect is 138 copies/mL. A negative result does not preclude SARS-Cov-2 infection and should not be used as the sole basis for treatment or other patient management decisions. A negative result may occur with  improper specimen collection/handling, submission of specimen other than nasopharyngeal swab, presence of viral mutation(s) within the areas targeted by this assay, and inadequate number of viral copies(<138 copies/mL). A negative result must be combined with clinical observations, patient history, and epidemiological information. The expected result is Negative.  Fact  Sheet for Patients:  BloggerCourse.com  Fact Sheet for Healthcare Providers:  SeriousBroker.it  This test is no t yet approved or cleared by  the [place]  FDA and  has been authorized for detection and/or diagnosis of SARS-CoV-2 by FDA under an Emergency Use Authorization (EUA). This EUA will remain  in effect (meaning this test can be used) for the duration of the COVID-19 declaration under Section 564(b)(1) of the Act, 21 U.S.C.section 360bbb-3(b)(1), unless the authorization is terminated  or revoked sooner.       Influenza A by PCR NEGATIVE NEGATIVE Final   Influenza B by PCR NEGATIVE NEGATIVE Final    Comment: (NOTE) The Xpert Xpress SARS-CoV-2/FLU/RSV plus assay is intended as an aid in the diagnosis of influenza from Nasopharyngeal swab specimens and should not be used as a sole basis for treatment. Nasal washings and aspirates are unacceptable for Xpert Xpress SARS-CoV-2/FLU/RSV testing.  Fact Sheet for Patients: BloggerCourse.com  Fact Sheet for Healthcare Providers: SeriousBroker.it  This test is not yet approved or cleared by the [place]  FDA and has been authorized for detection and/or diagnosis of SARS-CoV-2 by FDA under an Emergency Use Authorization (EUA). This EUA will remain in effect (meaning this test can be used) for the duration of the COVID-19 declaration under Section 564(b)(1) of the Act, 21 U.S.C. section 360bbb-3(b)(1), unless the authorization is terminated or revoked.     Resp Syncytial Virus by PCR NEGATIVE NEGATIVE Final    Comment: (NOTE) Fact Sheet for Patients: BloggerCourse.com  Fact Sheet for Healthcare Providers: SeriousBroker.it  This test is not yet approved or cleared by the [place]  FDA and has been authorized for detection and/or diagnosis of SARS-CoV-2 by FDA under an  Emergency Use Authorization (EUA). This EUA will remain in effect (meaning this test can be used) for the duration of the COVID-19 declaration under Section 564(b)(1) of the Act, 21 U.S.C. section 360bbb-3(b)(1), unless the authorization is terminated or revoked.  Performed at [facility], [address], [place], [place] [address]   Blood culture (routine x 2)     Status: None   Collection Time: 06/13/24  3:25 PM   Specimen: BLOOD  Result Value Ref Range Status   Specimen Description   Final    BLOOD SITE NOT SPECIFIED Performed at [facility], [address] [place], [place] [address]    Special Requests   Final    BOTTLES DRAWN AEROBIC AND ANAEROBIC Blood Culture adequate volume Performed at [facility], [address], [place], [place] [address]    Culture   Final    NO GROWTH 5 DAYS Performed at [facility], [address], [place], [place] [address]    Report Status 06/18/2024 FINAL  Final  MRSA Next Gen by PCR, Nasal     Status: None   Collection Time: 06/14/24  3:46 PM   Specimen: Nasal Mucosa; Nasal Swab  Result Value Ref Range Status   MRSA by PCR Next Gen NOT DETECTED NOT DETECTED Final    Comment: (NOTE) The GeneXpert MRSA Assay (FDA approved for NASAL specimens only), is one component of a comprehensive MRSA colonization surveillance program. It is not intended to diagnose MRSA infection nor to guide or monitor treatment for MRSA infections. Test performance is not FDA approved in patients less than 23 years old. Performed at [facility], [address] [place], [place] [address]   Culture, Respiratory w Gram Stain     Status: None   Collection Time: 06/18/24  3:43 AM   Specimen: Tracheal Aspirate; Respiratory  Result Value Ref Range Status   Specimen  Description TRACHEAL ASPIRATE  Final   Special Requests NONE  Final   Gram Stain   Final    NO WBC SEEN FEW GRAM NEGATIVE RODS RARE  GRAM POSITIVE COCCI    Culture   Final    FEW KLEBSIELLA PNEUMONIAE ABUNDANT NEISSERIA CINEREA BETA LACTAMASE POSITIVE Performed at [facility], [address] [place], [place] [address]    Report Status 06/20/2024 FINAL  Final   Organism ID, Bacteria KLEBSIELLA PNEUMONIAE  Final      Susceptibility   Klebsiella pneumoniae - MIC*    AMPICILLIN >=32 RESISTANT Resistant     CEFAZOLIN  (NON-URINE) 2 SENSITIVE Sensitive     CEFEPIME <=0.12 SENSITIVE Sensitive     ERTAPENEM <=0.12 SENSITIVE Sensitive     CEFTRIAXONE  <=0.25 SENSITIVE Sensitive     CIPROFLOXACIN <=0.06 SENSITIVE Sensitive     GENTAMICIN  <=1 SENSITIVE Sensitive     MEROPENEM <=0.25 SENSITIVE Sensitive     TRIMETH /SULFA  <=20 SENSITIVE Sensitive     AMPICILLIN/SULBACTAM 8 SENSITIVE Sensitive     PIP/TAZO Value in next row Sensitive      <=4 SENSITIVEThis is a modified FDA-approved test that has been validated and its performance characteristics determined by the reporting laboratory.  This laboratory is certified under the Clinical Laboratory Improvement Amendments CLIA as qualified to perform high complexity clinical laboratory testing.    * FEW KLEBSIELLA PNEUMONIAE  Respiratory (~20 pathogens) panel by PCR     Status: None   Collection Time: 06/18/24 11:21 AM   Specimen: Nasopharyngeal Swab; Respiratory  Result Value Ref Range Status   Adenovirus NOT DETECTED NOT DETECTED Final   Coronavirus 229E NOT DETECTED NOT DETECTED Final    Comment: (NOTE) The Coronavirus on the Respiratory Panel, DOES NOT test for the novel  Coronavirus (2019 nCoV)    Coronavirus HKU1 NOT DETECTED NOT DETECTED Final   Coronavirus NL63 NOT DETECTED NOT DETECTED Final   Coronavirus OC43 NOT DETECTED NOT DETECTED Final   Metapneumovirus NOT DETECTED NOT DETECTED Final   Rhinovirus / Enterovirus NOT DETECTED NOT DETECTED Final   Influenza A NOT DETECTED NOT DETECTED Final   Influenza B NOT DETECTED NOT DETECTED Final   Parainfluenza  Virus 1 NOT DETECTED NOT DETECTED Final   Parainfluenza Virus 2 NOT DETECTED NOT DETECTED Final   Parainfluenza Virus 3 NOT DETECTED NOT DETECTED Final   Parainfluenza Virus 4 NOT DETECTED NOT DETECTED Final   Respiratory Syncytial Virus NOT DETECTED NOT DETECTED Final   Bordetella pertussis NOT DETECTED NOT DETECTED Final   Bordetella Parapertussis NOT DETECTED NOT DETECTED Final   Chlamydophila pneumoniae NOT DETECTED NOT DETECTED Final   Mycoplasma pneumoniae NOT DETECTED NOT DETECTED Final    Comment: Performed at [facility], [address], [place], [place] [address]    Lab Basic Metabolic Panel: Recent Labs  Lab 06/19/24 [phone] 06/19/24 0434 06/20/24 0430 06/20/24 1400 06/20/24 1821 06/21/24 0340 06/21/24 0350 06/21/24 1106 06/21/24 1323 July 03, 2024 0249  NA 132*   < > 133* 134*  --  133*  --  134* 138 137  K 3.9   < > 3.7 3.3* 3.3* 3.8  --  3.6 3.1* 2.9*  CL 97*   < > 96* 96*  --  96*  --   --  99 99  CO2 23   < > 24 24  --  25  --   --  25 25  GLUCOSE 203*   < > 205* 201*  --  216*  --   --  223* 216*  BUN 53*   < > 56* 58*  --  61*  --   --  61* 67*  CREATININE 2.76*   < > 2.73* 2.89*  --  2.71*  --   --  2.79* 2.91*  CALCIUM  8.7*   < > 8.7* 8.6*  --  8.3*  --   --  8.3* 8.3*  MG 2.0  --  1.9  --  2.4  --  2.4  --   --  2.1  PHOS 5.2*   < > 5.2* 5.2*  --  4.7*  --   --  4.4 4.6   < > = values in this interval not displayed.   Liver Function Tests: Recent Labs  Lab 06/17/24 1102 06/17/24 1658 06/20/24 0430 06/20/24 1400 06/21/24 0340 06/21/24 1323 2024/06/25 0249  AST 12*  --   --   --   --   --   --   ALT 17  --   --   --   --   --   --   ALKPHOS 87  --   --   --   --   --   --   BILITOT 0.7  --   --   --   --   --   --   PROT 6.7  --   --   --   --   --   --   ALBUMIN  3.1*   < > 2.4* 2.2* 2.2* 2.1* 2.0*   < > = values in this interval not displayed.   No results for input(s): LIPASE, AMYLASE in the last 168 hours. No results for input(s):  AMMONIA in the last 168 hours. CBC: Recent Labs  Lab 06/17/24 1342 06/17/24 1354 06/18/24 2220 06/19/24 0353 06/19/24 0434 06/20/24 0430 06/21/24 0350 06/21/24 1106 06/25/24 0249  WBC 21.6*   < > 7.9 9.5  --  11.2* 8.1  --  9.9  NEUTROABS 19.9*  --   --   --   --   --   --   --   --   HGB 8.1*   < > 7.9* 8.1* 8.5* 8.6* 8.1* 9.5* 8.5*  HCT 25.2*   < > 24.1* 24.7* 25.0* 26.5* 25.1* 28.0* 26.5*  MCV 97.3   < > 94.9 94.6  --  95.7 95.4  --  95.7  PLT 232   < > 146* 159  --  165 154  --  154   < > = values in this interval not displayed.   Cardiac Enzymes: No results for input(s): CKTOTAL, CKMB, CKMBINDEX, TROPONINI in the last 168 hours. Sepsis Labs: Recent Labs  Lab 06/19/24 0353 06/19/24 0432 06/20/24 0430 06/21/24 0350 06/21/24 1119 06/21/24 1335 2024-06-25 0249  WBC 9.5  --  11.2* 8.1  --   --  9.9  LATICACIDVEN  --  0.7 0.6  --  0.5 0.6  --        Toribio JAYSON Sharps 06-25-2024, 8:57 PM

## 2024-07-19 NOTE — Progress Notes (Signed)
 PHARMACY - ANTICOAGULATION CONSULT NOTE  Pharmacy Consult for heparin  Indication: atrial fibrillation  Allergies  Allergen Reactions   Nsaids Other (See Comments)    Contraindication due to CKD III   Sglt2 Inhibitors Other (See Comments)    Recurrent UTI    Patient Measurements: Height: 6' 4 (193 cm) Weight: (!) 147.6 kg (325 lb 6.4 oz) IBW/kg (Calculated) : 86.8 HEPARIN  DW (KG): 122.2  Vital Signs: Temp: 99.1 F (37.3 C) 07-18-2024 0830) Temp Source: Core 2024-07-18 0800) BP: 101/54 July 18, 2024 0830) Pulse Rate: 166 18-Jul-2024 0821)  Labs: Recent Labs    06/20/24 0430 06/20/24 1400 06/21/24 0340 06/21/24 0350 06/21/24 1106 06/21/24 1323 2024-07-18 0249  HGB 8.6*  --   --  8.1* 9.5*  --  8.5*  HCT 26.5*  --   --  25.1* 28.0*  --  26.5*  PLT 165  --   --  154  --   --  154  HEPARINUNFRC 0.31  --  0.34  --   --   --  0.37  CREATININE 2.73*   < > 2.71*  --   --  2.79* 2.91*   < > = values in this interval not displayed.    Estimated Creatinine Clearance: 41.9 mL/min (A) (by C-G formula based on SCr of 2.91 mg/dL (H)).   Medical History: Past Medical History:  Diagnosis Date   AICD (automatic cardioverter/defibrillator) present    Arthritis    Atrial fibrillation (HCC)    Chronic HFrEF (heart failure with reduced ejection fraction) (HCC)    Chronic systolic heart failure (HCC)    Nonischemic CM: echo 4/12 with EF 25-30%, grade 2 diast dysfxn, mild dilated aortic root 43 mm, trivial MR, mod LAE   COPD (chronic obstructive pulmonary disease) (HCC)    Dyspnea    with exertion    H/O ETOH abuse    HTN (hypertension)    Hypersomnia    Mild CAD    Nonischemic cardiomyopathy (HCC)    Obesity, morbid (HCC)    OSA on CPAP    Tobacco use disorder    Ventricular tachycardia (HCC)    Assessment: 14 yoM admitted with cardiogenic shock. Pt on rivaroxaban  PTA for hx AF. Pt in renal failure and may potentially need MCS so will hold DOAC and use IV heparin . LD rivaroxaban   9/25.  Jul 18, 2024: Heparin  level 0.37, therapeutic on heparin  2800 units/hr. No issues with infusion running or signs of bleeding noted. CBC stable (Hgb 8.5, PLT 154).   Goal of Therapy:  Heparin  level 0.3-0.7 units/ml Monitor platelets by anticoagulation protocol: Yes   Plan:  Continue heparin  infusion at 2800 units/hr Monitor daily heparin  level, CBC, and for s/sx of bleeding   Thank you for allowing pharmacy to participate in this patient's care,  Morna Breach, PharmD PGY2 Cardiology Pharmacy Resident 07-18-2024 8:45 AM  [facility] pharmacy phone numbers are listed on [email or web address]

## 2024-07-19 NOTE — Progress Notes (Signed)
   NAMEAshawn Rinehart, MRN:  [id number], DOB:  09-02-1961, LOS: 9 ADMISSION DATE:  06/13/2024, CONSULTATION DATE:  06/14/2024 REFERRING MD:  TRH, CHIEF COMPLAINT:  Cardiogenic shock    History of Present Illness:  [name] [name] is a 63 y.o. male with a PMH significant for HFrEF, severely dilated LV, nonischemic cardiomyopathy, recurrent/sustained V. tach requiring episodic ICD shocks, presence of biventricular ICD, paroxysmal atrial fibrillation, CKD stage IIIb, OSA on BiPAP, HTN, anxiety, and depression who presented to the ED at [facility] 9/26 for complaints of shortness of breath and nausea. Workup on admission revealed decompensated CHF with concerns to progression of cardiogenic shock prompting PCCM and heart failure consult.   Pertinent  Medical History  HFrEF, severely dilated LV, nonischemic cardiomyopathy, recurrent/sustained V. tach requiring episodic ICD shocks, presence of biventricular ICD, paroxysmal atrial fibrillation, CKD stage IIIb, OSA on BiPAP, HTN, anxiety, and depression   Significant Hospital Events: Including procedures, antibiotic start and stop dates in addition to other pertinent events   9/26 presented to [name] ED for complaints of shortness of breath, weight gain, and nausea  9/27 required initiation of vasopressor support for persistent hypotension in the setting of evolving cardiogenic shock. PCCM and HF consulted  9/30 worsening tachypnea and dyspnea requiring intubation, increased norepi support  10/1 rapidly decompensated yesterday requiring max pressors, lidocaine , amiodarone , INO, steroids and methylene blue  10/2 pressor requirement stabilizing, worsening volume overload and increased FiO2 on vent, episode of VT and shocked by ICD several times  10/3 ICD turned off yesterday per family request, but had continue wanting further aggressive therapy.   Interim History / Subjective:  Progressively worsening respiratory status, agitation. Family at  bedside  Objective    Blood pressure 127/71, pulse 80, temperature 99.5 F (37.5 C), resp. rate (!) 28, height 6' 4 (1.93 m), weight (!) 147.6 kg, SpO2 91%. PAP: (33-55)/(24-47) 43/27 CVP:  [17 mmHg] 17 mmHg CO:  [8.8 L/min] 8.8 L/min CI:  [3.21 L/min/m2] 3.21 L/min/m2  Vent Mode: PRVC FiO2 (%):  [100 %] 100 % Set Rate:  [28 bmp] 28 bmp Vt Set:  [309 mL] 690 mL PEEP:  [8 cmH20] 8 cmH20 Plateau Pressure:  [25 cmH20-30 cmH20] 26 cmH20   Intake/Output Summary (Last 24 hours) at 06-27-24 0654 Last data filed at 06/27/2024 0600 Gross per 24 hour  Intake 5247.99 ml  Output 3925 ml  Net 1322.99 ml   Filed Weights   06/18/24 0500 06/19/24 0500 06/20/24 0459  Weight: (!) 146.6 kg (!) 136.9 kg (!) 147.6 kg   Ashen, air hungry Ext lukewarm Appears to be suffering Abd protuberant, hypoactive BS  Labs and imaging reviewed  Resolved problem list  Hyperkalemia  Assessment and Plan  Decompensated heart failure progressing to multisystem organ failure actively dying despite maximal support  Discussed with family that death is now imminent They would like patient to be comfortable Given refractory air hunger will add propofol  to fent and versed ; plan to extubate once appears more comfortable. Condolences offered  Appreciate AHF and nephro help with this tough case  31 min cc time Rolan Sharps MD PCCM

## 2024-07-19 NOTE — Progress Notes (Signed)
 Patient extubated to comfort per written order with RT and RN x2 at bedside.

## 2024-07-19 NOTE — Progress Notes (Addendum)
 eLink Physician-Brief Progress Note Patient Name: [name] [name] DOB: 05-24-1961 MRN: [id number]   Date of Service  2024-06-24  HPI/Events of Note  K 2.9  eICU Interventions  Kcl   0544 -had prolonged VT episode which eventually broke with coughing and vagal maneuvers.  Running amiodarone , off lidocaine , spontaneously resolved to A-fib in the 90s.  Replating potassium.  If episode repeats, will likely need repeat amiodarone  bolus  Intervention Category Minor Interventions: Electrolytes abnormality - evaluation and management  Alton Tremblay 24-Jun-2024, 4:52 AM

## 2024-07-19 NOTE — Progress Notes (Signed)
 Patient passed peacefully surrounded by loving family at [phone] after a planned one way extubation and transition to comfort care per orders.

## 2024-07-19 NOTE — Progress Notes (Signed)
 Patient ID: [name] [name], male   DOB: 1961/08/31, 63 y.o.   MRN: [id number]     Advanced Heart Failure Rounding Note  Cardiologist: Ezra Shuck, MD   Chief Complaint: Cardiogenic Shock  Subjective:    Remains intubated/sedated on multiple pressors. Relatively unchanged  On broad spectrum abx.   Frequent runs of prolonged VT on IV amio. Lido off due to high level  Respiratory culture grew Klebsiella and beta lactamase + Neisseria cinerea. On broad spectrum abx   Objective:    Weight Range: (!) 147.6 kg Body mass index is 39.61 kg/m.   Vital Signs:   Temp:  [96.4 F (35.8 C)-99.5 F (37.5 C)] 99.1 F (37.3 C) 03-Jul-2024 0830) Pulse Rate:  [76-166] 166 07/03/2024 0821) Resp:  [13-34] 29 07/03/24 0830) BP: (82-174)/(53-163) 101/54 July 03, 2024 0830) SpO2:  [63 %-100 %] 87 % 07-03-24 0821) FiO2 (%):  [100 %] 100 % 07/03/2024 0800) Last BM Date :  (PTA)  Weight change: Filed Weights   06/18/24 0500 06/19/24 0500 06/20/24 0459  Weight: (!) 146.6 kg (!) 136.9 kg (!) 147.6 kg   Intake/Output:  Intake/Output Summary (Last 24 hours) at 07/03/24 0942 Last data filed at 07/03/2024 0800 Gross per 24 hour  Intake 4993.57 ml  Output 2875 ml  Net 2118.57 ml    Physical Exam    General: Critically ill on vent. Uncomfortable HEENT: +ETT Neck: RIJ swan + HD cath Cor: reg tachy  Lungs: clear Abdomen: obese distended  Extremities: no cyanosis, clubbing, rash, 2-3+ edema Neuro: intubated/sedated but uncomfortable  Telemetry   Sustained VT 150-160 Personally reviewed  Labs    CBC Recent Labs    06/21/24 0350 06/21/24 1106 07-03-2024 0249  WBC 8.1  --  9.9  HGB 8.1* 9.5* 8.5*  HCT 25.1* 28.0* 26.5*  MCV 95.4  --  95.7  PLT 154  --  154   Basic Metabolic Panel Recent Labs    89/95/74 0350 06/21/24 1106 06/21/24 1323 03-Jul-2024 0249  NA  --    < > 138 137  K  --    < > 3.1* 2.9*  CL  --   --  99 99  CO2  --   --  25 25  GLUCOSE  --   --  223* 216*  BUN  --   --  61* 67*   CREATININE  --   --  2.79* 2.91*  CALCIUM   --   --  8.3* 8.3*  MG 2.4  --   --  2.1  PHOS  --   --  4.4 4.6   < > = values in this interval not displayed.   Liver Function Tests Recent Labs    06/21/24 1323 2024/07/03 0249  ALBUMIN  2.1* 2.0*   BNP (last 3 results) Recent Labs    03/26/24 1413 04/28/24 1023 06/04/24 0204  BNP 326.3* 611.9* 219.1*   ProBNP (last 3 results) Recent Labs    06/13/24 1435  PROBNP 2,884.0*   Medications:    Scheduled Medications:  atorvastatin   80 mg Per Tube QPM   Chlorhexidine  Gluconate Cloth  6 each Topical Daily   docusate  100 mg Per Tube BID   famotidine  20 mg Per Tube Q0600   feeding supplement (VITAL 1.5 CAL)  480 mL Per Tube Q24H   folic acid   1 mg Per Tube Daily   hydrocortisone sod succinate (SOLU-CORTEF) inj  100 mg Intravenous Q8H   insulin aspart  0-20 Units Subcutaneous Q4H   insulin  glargine  5 Units Subcutaneous BID   mouth rinse  15 mL Mouth Rinse Q2H   pantoprazole  (PROTONIX ) IV  40 mg Intravenous Q24H   polyethylene glycol  17 g Per Tube BID   senna-docusate  2 tablet Per Tube QHS   thiamine   100 mg Per Tube Daily    Infusions:  amiodarone  60 mg/hr (07/22/24 0918)   dexmedetomidine (PRECEDEX) IV infusion 1.2 mcg/kg/hr (22-Jul-2024 0917)   EPINEPHrine  (ADRENALIN ) 10 mg in sodium chloride  0.9 % 250 mL (0.04 mg/mL) infusion 3 mcg/min (22-Aug-2024 0800)   fentaNYL  infusion INTRAVENOUS 300 mcg/hr (2024/07/22 0800)   furosemide  (LASIX ) 200 mg in dextrose  5 % 100 mL (2 mg/mL) infusion 30 mg/hr (2024/07/22 0800)   heparin  2,800 Units/hr (2024/07/22 0800)   midazolam  4 mg/hr (07-22-2024 0800)   norepinephrine (LEVOPHED) Adult infusion 6 mcg/min (22-Aug-2024 0800)   piperacillin-tazobactam (ZOSYN)  IV Stopped (Jul 22, 2024 9344)   potassium chloride  10 mEq (07/22/2024 0916)   vancomycin  1,000 mg (07-22-2024 0814)   vasopressin 0.04 Units/min (22-Aug-2024 0800)    PRN Medications: acetaminophen  **OR** acetaminophen , albuterol ,  butalbital -acetaminophen -caffeine , fentaNYL , midazolam , ondansetron  **OR** ondansetron  (ZOFRAN ) IV, mouth rinse, oxyCODONE   Assessment/Plan   1. 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 Minimal CAD on cath in July 2016 and again in 9/24. [employer] CRT-D device. ETOH vs HTN vs myocarditis as cause of cardiomyopathy. Echo 12/22 and again in 9/24 showed that EF remains 20-25%.  PYP scan not suggestive of TTR cardiac amyloidosis.  RHC 5/25 showed R>L heart failure with preserved cardiac output by Fick and thermo but low PAPi.  He historically has very frequent NSVT and VT.  Last echo in 8/25 showed EF 20-25%, normal RV function per report, mild MR.  He was admitted with low BP (baseline SBP 90s) and marked volume overload.  Started on peripheral norepinephrine. RHC showed markedly elevated biventricular filling pressures but cardiac index normal by Fick and thermodilution and lactate normal. PAPi was low.   Milrinone begun for RV support. Persistent hypotension/vasoplegia. Now off milrinone  - On NE 6, VP 0.04 Epi at 3. Titrate as needed - He is making little to no progress due to a combination of septic shock on top of end-stage HF.  - Now with more persistent VT. He is truly end-stage - Long talk with family with CCM regarding GOC yesterday. We revisited this today - They do not want him to suffer and woud not want to prolong his course  - He remains DNR/no shocks - There is no meaningful chance for survival and in order to prevent further suffering would recommend transition to comfort care today. D/w family and CCM at bedside  2. Acute Hypoxic Respiratory Failure: due to pulmonary edema.   - Remains intubated on 80%  - vent mgmt per CCM - sputum growing klebsiella and neisseria cinerea. Continue abx  3. Paroxysmal atrial fibrillation:  - AF noted 9/30 on live device interogation, but predominantly VT - predominately AV pacing - Remains on amio but having  worsenign VT - Continue heparin  gtt  4. VT: Had VT ablation at [facility] in 4/25 but still with frequent NSVT and VT.  Had VT with shock earlier this month. He has been referred to EP at [facility] for additional evaluation.  - developed frequent long runs of NSVT 9/30 with hemodynamic compromise - Having frequent VT/NSVT despite ai and lido. Lid level 7. Stop lido - Family would not want further shock. ICD is off - Keep  K > 4.0 Mg > 2.0  5. AKI on CKD stage 3: Creatinine up to 4.47, baseline recently has been around 2.2-2.3.  Suspect cardiorenal with markedly elevated right-sided filling pressures. - CRRT started 9/28-9/29, after decompensation 9/29 family required CRRT be stopped.  - sCr 2.9 >2.15>2.77>2.73-> 2.79 -> 2.9 - making urine but net + with IV intake - Family not wanting furterh CRRT  6. Anemia: s/p IV Fe  - Keep hgb > 7.5  7. OSA: intubated.  8. ETOH abuse: Suspect this contributes to cardiomyopathy.    Code Status: DNR - No CPR or shocks  -ICD deactivated 10/2 after multiple shocks -Ongoing GOC discussions with family  As above need to transition to comfort care. Awaiting further family to arrive to discuss.  CRITICAL CARE Performed by: Toribio Fuel   Total critical care time: 50 minutes  Critical care time was exclusive of separately billable procedures and treating other patients.  Critical care was necessary to treat or prevent imminent or life-threatening deterioration.  Critical care was time spent personally by me on the following activities: development of treatment plan with patient and/or surrogate as well as nursing, discussions with consultants, evaluation of patient's response to treatment, examination of patient, obtaining history from patient or surrogate, ordering and performing treatments and interventions, ordering and review of laboratory studies, ordering and review of radiographic studies, pulse oximetry and re-evaluation of patient's condition.    Length of Stay: 9  Toribio Fuel, MD  2024/07/11, 9:42 AM  Advanced Heart Failure Team Pager [phone] (M-F; 7a - 5p)  Please contact [facility] for night-coverage after hours (5p -7a ) and weekends on [email or web address]

## 2024-07-19 NOTE — Progress Notes (Signed)
 Patient sustaining VT 160s. BP stable. Amio bolus given per Dr. Claudene.

## 2024-07-19 DEATH — deceased

## 2024-08-06 ENCOUNTER — Encounter: Admitting: Cardiovascular Disease

## 2024-08-20 ENCOUNTER — Ambulatory Visit: Payer: Medicare HMO
